# Patient Record
Sex: Female | Born: 1945
Health system: Southern US, Community
[De-identification: ages and names within clinical notes are randomized; demographics above are authoritative.]

## PROBLEM LIST (undated history)

## (undated) DIAGNOSIS — K649 Unspecified hemorrhoids: Secondary | ICD-10-CM

## (undated) DIAGNOSIS — Z9981 Dependence on supplemental oxygen: Secondary | ICD-10-CM

## (undated) DIAGNOSIS — E119 Type 2 diabetes mellitus without complications: Secondary | ICD-10-CM

## (undated) DIAGNOSIS — K5790 Diverticulosis of intestine, part unspecified, without perforation or abscess without bleeding: Secondary | ICD-10-CM

## (undated) DIAGNOSIS — C801 Malignant (primary) neoplasm, unspecified: Secondary | ICD-10-CM

## (undated) DIAGNOSIS — F0390 Unspecified dementia without behavioral disturbance: Secondary | ICD-10-CM

## (undated) DIAGNOSIS — I10 Essential (primary) hypertension: Secondary | ICD-10-CM

## (undated) DIAGNOSIS — I4892 Unspecified atrial flutter: Secondary | ICD-10-CM

## (undated) DIAGNOSIS — J449 Chronic obstructive pulmonary disease, unspecified: Secondary | ICD-10-CM

## (undated) DIAGNOSIS — Z853 Personal history of malignant neoplasm of breast: Secondary | ICD-10-CM

## (undated) HISTORY — DX: Essential (primary) hypertension: I10

## (undated) HISTORY — DX: Chronic obstructive pulmonary disease, unspecified: J44.9

## (undated) HISTORY — DX: Personal history of malignant neoplasm of breast: Z85.3

## (undated) HISTORY — PX: HERNIA REPAIR: SHX51

---

## 2004-05-31 ENCOUNTER — Ambulatory Visit: Payer: Self-pay | Admitting: Cardiology

## 2009-03-13 HISTORY — PX: MASTECTOMY: SHX3

## 2009-03-13 HISTORY — PX: BREAST SURGERY: SHX581

## 2009-08-27 ENCOUNTER — Encounter: Admission: RE | Admit: 2009-08-27 | Discharge: 2009-08-27 | Payer: Self-pay | Admitting: Unknown Physician Specialty

## 2009-09-02 ENCOUNTER — Encounter: Admission: RE | Admit: 2009-09-02 | Discharge: 2009-09-02 | Payer: Self-pay | Admitting: Unknown Physician Specialty

## 2009-09-14 ENCOUNTER — Ambulatory Visit (HOSPITAL_COMMUNITY): Admission: RE | Admit: 2009-09-14 | Discharge: 2009-09-14 | Payer: Self-pay | Admitting: General Surgery

## 2009-09-14 ENCOUNTER — Encounter: Admission: RE | Admit: 2009-09-14 | Discharge: 2009-09-14 | Payer: Self-pay | Admitting: General Surgery

## 2009-10-11 DIAGNOSIS — C50919 Malignant neoplasm of unspecified site of unspecified female breast: Secondary | ICD-10-CM | POA: Insufficient documentation

## 2009-10-13 ENCOUNTER — Ambulatory Visit: Payer: Self-pay | Admitting: Oncology

## 2009-11-02 ENCOUNTER — Ambulatory Visit (HOSPITAL_COMMUNITY): Admission: RE | Admit: 2009-11-02 | Discharge: 2009-11-03 | Payer: Self-pay | Admitting: General Surgery

## 2009-11-02 ENCOUNTER — Encounter (INDEPENDENT_AMBULATORY_CARE_PROVIDER_SITE_OTHER): Payer: Self-pay | Admitting: General Surgery

## 2010-05-27 LAB — CBC
HCT: 40 % (ref 36.0–46.0)
Hemoglobin: 13.5 g/dL (ref 12.0–15.0)
MCH: 29 pg (ref 26.0–34.0)
MCV: 85.9 fL (ref 78.0–100.0)
Platelets: 267 10*3/uL (ref 150–400)
RBC: 4.65 MIL/uL (ref 3.87–5.11)
RDW: 14.5 % (ref 11.5–15.5)

## 2010-05-27 LAB — BASIC METABOLIC PANEL
CO2: 31 mEq/L (ref 19–32)
Chloride: 101 mEq/L (ref 96–112)
Sodium: 139 mEq/L (ref 135–145)

## 2010-05-29 LAB — URINALYSIS, ROUTINE W REFLEX MICROSCOPIC
Bilirubin Urine: NEGATIVE
Hgb urine dipstick: NEGATIVE
Nitrite: NEGATIVE

## 2010-05-29 LAB — COMPREHENSIVE METABOLIC PANEL
ALT: 20 U/L (ref 0–35)
Creatinine, Ser: 0.67 mg/dL (ref 0.4–1.2)
Glucose, Bld: 112 mg/dL — ABNORMAL HIGH (ref 70–99)
Sodium: 141 mEq/L (ref 135–145)

## 2010-05-29 LAB — CBC
HCT: 43 % (ref 36.0–46.0)
MCH: 29.3 pg (ref 26.0–34.0)
MCHC: 33.2 g/dL (ref 30.0–36.0)
RBC: 4.86 MIL/uL (ref 3.87–5.11)
WBC: 8.5 10*3/uL (ref 4.0–10.5)

## 2010-05-29 LAB — SURGICAL PCR SCREEN: MRSA, PCR: NEGATIVE

## 2010-05-29 LAB — DIFFERENTIAL
Basophils Relative: 0 % (ref 0–1)
Eosinophils Absolute: 0.1 10*3/uL (ref 0.0–0.7)
Lymphocytes Relative: 24 % (ref 12–46)
Lymphs Abs: 2 10*3/uL (ref 0.7–4.0)
Neutro Abs: 5.8 10*3/uL (ref 1.7–7.7)

## 2010-08-09 ENCOUNTER — Encounter (INDEPENDENT_AMBULATORY_CARE_PROVIDER_SITE_OTHER): Payer: Self-pay | Admitting: General Surgery

## 2011-01-09 ENCOUNTER — Encounter (INDEPENDENT_AMBULATORY_CARE_PROVIDER_SITE_OTHER): Payer: Self-pay | Admitting: General Surgery

## 2011-03-10 ENCOUNTER — Ambulatory Visit (INDEPENDENT_AMBULATORY_CARE_PROVIDER_SITE_OTHER): Payer: Medicare Other | Admitting: General Surgery

## 2011-03-10 ENCOUNTER — Encounter (INDEPENDENT_AMBULATORY_CARE_PROVIDER_SITE_OTHER): Payer: Self-pay | Admitting: General Surgery

## 2011-03-10 VITALS — BP 156/98 | HR 112 | Temp 97.2°F | Resp 24 | Ht 62.0 in | Wt 188.6 lb

## 2011-03-10 DIAGNOSIS — D0590 Unspecified type of carcinoma in situ of unspecified breast: Secondary | ICD-10-CM

## 2011-03-10 DIAGNOSIS — D0510 Intraductal carcinoma in situ of unspecified breast: Secondary | ICD-10-CM | POA: Insufficient documentation

## 2011-03-10 NOTE — Progress Notes (Signed)
Patient returns for long-term followup status post right total mastectomy for high-grade ductal carcinoma in situ with a surgery date August of 2011. She is followed by Dr. Ubaldo Glassing in [place]. She reports she is doing well. She denies any arm swelling, breast masses or chest wall changes. She is tolerating her Arimadex with just hot flashes.  Imaging: She reports negative mammogram in [place] is here but I do not have access to that.  Exam: General: Moderately obese Afro-American female in no distress Skin: Warm and dry without rash or infection Lungs: Few scattered wheezes without increased work of breathing. Breath sounds equal. Lymph nodes: No cervical, supraclavicular, or axillary nodes palpable Breasts: Right mastectomy site well-healed with no skin changes or masses. Left breast without masses skin or nipple changes.  Assessment and plan: Doing well following right total mastectomy for DCIS with no evidence of new or recurrent breast cancer clinically. She will return in one year and this could likely be a final check.

## 2012-01-02 ENCOUNTER — Encounter: Payer: Self-pay | Admitting: Internal Medicine

## 2012-01-02 DIAGNOSIS — M810 Age-related osteoporosis without current pathological fracture: Secondary | ICD-10-CM

## 2012-01-02 DIAGNOSIS — D0590 Unspecified type of carcinoma in situ of unspecified breast: Secondary | ICD-10-CM

## 2012-01-18 DIAGNOSIS — C50919 Malignant neoplasm of unspecified site of unspecified female breast: Secondary | ICD-10-CM

## 2012-01-18 DIAGNOSIS — M810 Age-related osteoporosis without current pathological fracture: Secondary | ICD-10-CM

## 2012-06-26 ENCOUNTER — Encounter (INDEPENDENT_AMBULATORY_CARE_PROVIDER_SITE_OTHER): Payer: Self-pay | Admitting: Internal Medicine

## 2012-06-26 DIAGNOSIS — C50919 Malignant neoplasm of unspecified site of unspecified female breast: Secondary | ICD-10-CM

## 2012-06-26 DIAGNOSIS — J449 Chronic obstructive pulmonary disease, unspecified: Secondary | ICD-10-CM

## 2012-06-26 DIAGNOSIS — M810 Age-related osteoporosis without current pathological fracture: Secondary | ICD-10-CM

## 2012-07-08 DIAGNOSIS — M810 Age-related osteoporosis without current pathological fracture: Secondary | ICD-10-CM

## 2012-12-30 DIAGNOSIS — M810 Age-related osteoporosis without current pathological fracture: Secondary | ICD-10-CM

## 2012-12-30 DIAGNOSIS — C50919 Malignant neoplasm of unspecified site of unspecified female breast: Secondary | ICD-10-CM

## 2012-12-30 DIAGNOSIS — Z9010 Acquired absence of unspecified breast and nipple: Secondary | ICD-10-CM

## 2013-01-01 DIAGNOSIS — M810 Age-related osteoporosis without current pathological fracture: Secondary | ICD-10-CM

## 2013-08-27 ENCOUNTER — Other Ambulatory Visit: Payer: Self-pay | Admitting: *Deleted

## 2014-12-20 ENCOUNTER — Inpatient Hospital Stay (HOSPITAL_COMMUNITY)
Admission: EM | Admit: 2014-12-20 | Discharge: 2014-12-22 | DRG: 190 | Disposition: A | Discharge status: Home / Self Care | Payer: Medicare HMO | Attending: Internal Medicine | Admitting: Internal Medicine

## 2014-12-20 ENCOUNTER — Emergency Department (HOSPITAL_COMMUNITY): Payer: Medicare HMO

## 2014-12-20 ENCOUNTER — Encounter (HOSPITAL_COMMUNITY): Payer: Self-pay | Admitting: Emergency Medicine

## 2014-12-20 DIAGNOSIS — Z87891 Personal history of nicotine dependence: Secondary | ICD-10-CM

## 2014-12-20 DIAGNOSIS — J9621 Acute and chronic respiratory failure with hypoxia: Secondary | ICD-10-CM | POA: Diagnosis present

## 2014-12-20 DIAGNOSIS — I10 Essential (primary) hypertension: Secondary | ICD-10-CM | POA: Diagnosis present

## 2014-12-20 DIAGNOSIS — Z833 Family history of diabetes mellitus: Secondary | ICD-10-CM | POA: Diagnosis not present

## 2014-12-20 DIAGNOSIS — J441 Chronic obstructive pulmonary disease with (acute) exacerbation: Principal | ICD-10-CM | POA: Diagnosis present

## 2014-12-20 DIAGNOSIS — Z853 Personal history of malignant neoplasm of breast: Secondary | ICD-10-CM

## 2014-12-20 DIAGNOSIS — Z9981 Dependence on supplemental oxygen: Secondary | ICD-10-CM

## 2014-12-20 DIAGNOSIS — J45909 Unspecified asthma, uncomplicated: Secondary | ICD-10-CM | POA: Diagnosis present

## 2014-12-20 DIAGNOSIS — Z7952 Long term (current) use of systemic steroids: Secondary | ICD-10-CM

## 2014-12-20 DIAGNOSIS — J449 Chronic obstructive pulmonary disease, unspecified: Secondary | ICD-10-CM | POA: Diagnosis present

## 2014-12-20 DIAGNOSIS — D0510 Intraductal carcinoma in situ of unspecified breast: Secondary | ICD-10-CM | POA: Diagnosis present

## 2014-12-20 DIAGNOSIS — Z809 Family history of malignant neoplasm, unspecified: Secondary | ICD-10-CM | POA: Diagnosis not present

## 2014-12-20 DIAGNOSIS — J440 Chronic obstructive pulmonary disease with acute lower respiratory infection: Secondary | ICD-10-CM

## 2014-12-20 DIAGNOSIS — R0602 Shortness of breath: Secondary | ICD-10-CM | POA: Diagnosis present

## 2014-12-20 DIAGNOSIS — J209 Acute bronchitis, unspecified: Secondary | ICD-10-CM

## 2014-12-20 DIAGNOSIS — Z9010 Acquired absence of unspecified breast and nipple: Secondary | ICD-10-CM

## 2014-12-20 LAB — COMPREHENSIVE METABOLIC PANEL
ALBUMIN: 4.5 g/dL (ref 3.5–5.0)
ALT: 21 U/L (ref 14–54)
AST: 24 U/L (ref 15–41)
Alkaline Phosphatase: 66 U/L (ref 38–126)
Anion gap: 9 (ref 5–15)
BUN: 15 mg/dL (ref 6–20)
CHLORIDE: 98 mmol/L — AB (ref 101–111)
CO2: 33 mmol/L — AB (ref 22–32)
CREATININE: 0.63 mg/dL (ref 0.44–1.00)
Calcium: 10.9 mg/dL — ABNORMAL HIGH (ref 8.9–10.3)
GFR calc Af Amer: >60 mL/min (ref >60)
GLUCOSE: 111 mg/dL — AB (ref 65–99)
POTASSIUM: 4.2 mmol/L (ref 3.5–5.1)
SODIUM: 140 mmol/L (ref 135–145)
Total Bilirubin: 0.7 mg/dL (ref 0.3–1.2)
Total Protein: 7.7 g/dL (ref 6.5–8.1)

## 2014-12-20 LAB — CBC WITH DIFFERENTIAL/PLATELET
BASOS ABS: 0 10*3/uL (ref 0.0–0.1)
BASOS PCT: 0 %
EOS PCT: 0 %
Eosinophils Absolute: 0.1 10*3/uL (ref 0.0–0.7)
HCT: 45.6 % (ref 36.0–46.0)
Hemoglobin: 14.5 g/dL (ref 12.0–15.0)
LYMPHS PCT: 13 %
Lymphs Abs: 1.5 10*3/uL (ref 0.7–4.0)
MCH: 29.3 pg (ref 26.0–34.0)
MCHC: 31.8 g/dL (ref 30.0–36.0)
MCV: 92.1 fL (ref 78.0–100.0)
Monocytes Absolute: 0.4 10*3/uL (ref 0.1–1.0)
Monocytes Relative: 3 %
Neutro Abs: 9.7 10*3/uL — ABNORMAL HIGH (ref 1.7–7.7)
Neutrophils Relative %: 84 %
PLATELETS: 250 10*3/uL (ref 150–400)
RBC: 4.95 MIL/uL (ref 3.87–5.11)
RDW: 13.4 % (ref 11.5–15.5)
WBC: 11.6 10*3/uL — ABNORMAL HIGH (ref 4.0–10.5)

## 2014-12-20 LAB — I-STAT TROPONIN, ED: Troponin i, poc: 0 ng/mL (ref 0.00–0.08)

## 2014-12-20 MED ORDER — IBUPROFEN 400 MG PO TABS
400.0000 mg | ORAL_TABLET | Freq: Four times a day (QID) | ORAL | Status: DC | PRN
  Administered 2014-12-20: 400 mg via ORAL
  Filled 2014-12-20: qty 1

## 2014-12-20 MED ORDER — LEVOFLOXACIN IN D5W 500 MG/100ML IV SOLN
500.0000 mg | INTRAVENOUS | Status: DC
  Administered 2014-12-20 – 2014-12-21 (×2): 500 mg via INTRAVENOUS
  Filled 2014-12-20 (×2): qty 100

## 2014-12-20 MED ORDER — IPRATROPIUM-ALBUTEROL 0.5-2.5 (3) MG/3ML IN SOLN
3.0000 mL | Freq: Once | RESPIRATORY_TRACT | Status: AC
  Administered 2014-12-20: 3 mL via RESPIRATORY_TRACT
  Filled 2014-12-20: qty 3

## 2014-12-20 MED ORDER — ALBUTEROL SULFATE (2.5 MG/3ML) 0.083% IN NEBU
2.5000 mg | INHALATION_SOLUTION | Freq: Once | RESPIRATORY_TRACT | Status: AC
  Administered 2014-12-20: 2.5 mg via RESPIRATORY_TRACT
  Filled 2014-12-20: qty 3

## 2014-12-20 MED ORDER — DIPHENHYDRAMINE HCL 25 MG PO CAPS
25.0000 mg | ORAL_CAPSULE | Freq: Once | ORAL | Status: AC
  Administered 2014-12-20: 25 mg via ORAL
  Filled 2014-12-20: qty 1

## 2014-12-20 MED ORDER — ALBUTEROL SULFATE (2.5 MG/3ML) 0.083% IN NEBU: 2.5000 mg | INHALATION_SOLUTION | Freq: Four times a day (QID) | RESPIRATORY_TRACT | Status: DC | PRN

## 2014-12-20 MED ORDER — ALBUTEROL SULFATE HFA 108 (90 BASE) MCG/ACT IN AERS
2.0000 | INHALATION_SPRAY | Freq: Four times a day (QID) | RESPIRATORY_TRACT | Status: DC | PRN
  Filled 2014-12-20: qty 6.7

## 2014-12-20 MED ORDER — VITAMIN D (ERGOCALCIFEROL) 1.25 MG (50000 UNIT) PO CAPS
50000.0000 [IU] | ORAL_CAPSULE | ORAL | Status: DC
  Filled 2014-12-20: qty 1

## 2014-12-20 MED ORDER — MOMETASONE FURO-FORMOTEROL FUM 100-5 MCG/ACT IN AERO
2.0000 | INHALATION_SPRAY | Freq: Two times a day (BID) | RESPIRATORY_TRACT | Status: DC
  Administered 2014-12-20 – 2014-12-22 (×4): 2 via RESPIRATORY_TRACT
  Filled 2014-12-20: qty 8.8

## 2014-12-20 MED ORDER — INFLUENZA VAC SPLIT QUAD 0.5 ML IM SUSY
0.5000 mL | PREFILLED_SYRINGE | INTRAMUSCULAR | Status: AC
  Administered 2014-12-21: 0.5 mL via INTRAMUSCULAR
  Filled 2014-12-20: qty 0.5

## 2014-12-20 MED ORDER — MOMETASONE FURO-FORMOTEROL FUM 100-5 MCG/ACT IN AERO
INHALATION_SPRAY | RESPIRATORY_TRACT | Status: AC
  Filled 2014-12-20: qty 8.8

## 2014-12-20 MED ORDER — BUDESONIDE-FORMOTEROL FUMARATE 160-4.5 MCG/ACT IN AERO
2.0000 | INHALATION_SPRAY | Freq: Every day | RESPIRATORY_TRACT | Status: DC
  Filled 2014-12-20: qty 6

## 2014-12-20 MED ORDER — ONDANSETRON HCL 4 MG/2ML IJ SOLN: 4.0000 mg | Freq: Four times a day (QID) | INTRAMUSCULAR | Status: DC | PRN

## 2014-12-20 MED ORDER — TELMISARTAN-HCTZ 80-12.5 MG PO TABS
1.0000 | ORAL_TABLET | Freq: Every day | ORAL | Status: DC
  Filled 2014-12-20 (×2): qty 1

## 2014-12-20 MED ORDER — MECLIZINE HCL 12.5 MG PO TABS: 25.0000 mg | ORAL_TABLET | Freq: Three times a day (TID) | ORAL | Status: DC | PRN

## 2014-12-20 MED ORDER — HYDROCHLOROTHIAZIDE 12.5 MG PO CAPS
12.5000 mg | ORAL_CAPSULE | Freq: Every day | ORAL | Status: DC
  Administered 2014-12-20 – 2014-12-22 (×3): 12.5 mg via ORAL
  Filled 2014-12-20 (×3): qty 1

## 2014-12-20 MED ORDER — METHYLPREDNISOLONE SODIUM SUCC 125 MG IJ SOLR
125.0000 mg | Freq: Once | INTRAMUSCULAR | Status: AC
  Administered 2014-12-20: 125 mg via INTRAVENOUS
  Filled 2014-12-20: qty 2

## 2014-12-20 MED ORDER — BUDESONIDE-FORMOTEROL FUMARATE 160-4.5 MCG/ACT IN AERO
2.0000 | INHALATION_SPRAY | Freq: Two times a day (BID) | RESPIRATORY_TRACT | Status: DC
  Administered 2014-12-21 – 2014-12-22 (×3): 2 via RESPIRATORY_TRACT
  Filled 2014-12-20: qty 6

## 2014-12-20 MED ORDER — ENOXAPARIN SODIUM 40 MG/0.4ML ~~LOC~~ SOLN
40.0000 mg | SUBCUTANEOUS | Status: DC
  Administered 2014-12-20 – 2014-12-21 (×2): 40 mg via SUBCUTANEOUS
  Filled 2014-12-20 (×2): qty 0.4

## 2014-12-20 MED ORDER — TIOTROPIUM BROMIDE MONOHYDRATE 18 MCG IN CAPS
18.0000 ug | ORAL_CAPSULE | Freq: Every day | RESPIRATORY_TRACT | Status: DC
  Administered 2014-12-21 – 2014-12-22 (×2): 18 ug via RESPIRATORY_TRACT
  Filled 2014-12-20: qty 5

## 2014-12-20 MED ORDER — ONDANSETRON HCL 4 MG PO TABS: 4.0000 mg | ORAL_TABLET | Freq: Four times a day (QID) | ORAL | Status: DC | PRN

## 2014-12-20 MED ORDER — CETYLPYRIDINIUM CHLORIDE 0.05 % MT LIQD
7.0000 mL | Freq: Two times a day (BID) | OROMUCOSAL | Status: DC
  Administered 2014-12-21 – 2014-12-22 (×2): 7 mL via OROMUCOSAL

## 2014-12-20 MED ORDER — TIOTROPIUM BROMIDE MONOHYDRATE 18 MCG IN CAPS
18.0000 ug | ORAL_CAPSULE | Freq: Every day | RESPIRATORY_TRACT | Status: DC
  Filled 2014-12-20: qty 5

## 2014-12-20 MED ORDER — METHYLPREDNISOLONE SODIUM SUCC 125 MG IJ SOLR
80.0000 mg | Freq: Four times a day (QID) | INTRAMUSCULAR | Status: DC
  Administered 2014-12-20 – 2014-12-21 (×3): 80 mg via INTRAVENOUS
  Filled 2014-12-20 (×3): qty 2

## 2014-12-20 MED ORDER — VITAMIN D 1000 UNITS PO TABS
1000.0000 [IU] | ORAL_TABLET | Freq: Every day | ORAL | Status: DC
  Administered 2014-12-20 – 2014-12-22 (×3): 1000 [IU] via ORAL
  Filled 2014-12-20 (×5): qty 1

## 2014-12-20 MED ORDER — IRBESARTAN 300 MG PO TABS
300.0000 mg | ORAL_TABLET | Freq: Every day | ORAL | Status: DC
  Administered 2014-12-20 – 2014-12-22 (×3): 300 mg via ORAL
  Filled 2014-12-20 (×3): qty 1

## 2014-12-20 NOTE — ED Notes (Signed)
Patient complaining of worsening shortness of breath and cough. States productive cough with thick, clear mucus. Denies pain at this time. Wears oxygen via nasal canula continuously.

## 2014-12-20 NOTE — ED Notes (Signed)
Patient ambulated to restroom without oxygen. Patient's shortness of breath increased. Oxygen saturation on room air 79-82%. Patient placed back on oxygen via nasal canula and saturation back to 92%.

## 2014-12-20 NOTE — H&P (Signed)
[facility] History and Physical  Eileen A Heiny QVZ:[id number] DOB: 1946/01/20 DOA: 12/20/2014  Referring physician: ER PCP: Cleda Mccreedy, MD   Chief Complaint: Dyspnea  HPI: Eileen [name] is a 69 y.o. female  This is a 69 year old woman who is a 2 to three-week history of increasing dyspnea and wheezing associated with a productive cough of white/greenish sputum. She denies any fever. She is a ex-smoker and stopped smoking in 1996. She is oxygen-dependent and steroid-dependent every other day. Despite receiving bronchodilating treatment in the emergency room, she has not improved and in fact dropped her  oxygen saturations. She denied any chest pain, palpitations or limb weakness. She is now being admitted for further management.   Review of Systems:  Apart from symptoms above, all systems are negative.  Past Medical History  Diagnosis Date  . Hypertension   . Asthma   . History of breast cancer     right breast  . COPD (chronic obstructive pulmonary disease) ([place])    Past Surgical History  Procedure Laterality Date  . Mastectomy  2011    right breast  . Cesarean section    . Hernia repair      RIH   Social History:  reports that she quit smoking about 20 years ago. She does not have any smokeless tobacco history on file. She reports that she does not drink alcohol or use illicit drugs.  Allergies  Allergen Reactions  . Codeine Other (See Comments)    "jittery"    Family History  Problem Relation Age of Onset  . Cancer Mother     lung  . Diabetes Brother     Prior to Admission medications   Medication Sig Start Date End Date Taking? Authorizing Provider  albuterol (PROVENTIL) (2.5 MG/3ML) 0.083% nebulizer solution Take 2.5 mg by nebulization every 6 (six) hours as needed for wheezing or shortness of breath.   Yes Historical Provider, MD  Ascorbic Acid (VITAMIN C PO) Take 1 tablet by mouth daily.   Yes Historical Provider, MD  Cholecalciferol (VITAMIN  D PO) Take 1 tablet by mouth daily.    Yes Historical Provider, MD  Fluticasone-Salmeterol (ADVAIR) 250-50 MCG/DOSE AEPB Inhale 1 puff into the lungs 2 (two) times daily.   Yes Historical Provider, MD  ibuprofen (ADVIL,MOTRIN) 200 MG tablet Take 400 mg by mouth every 6 (six) hours as needed for moderate pain.   Yes Historical Provider, MD  meclizine (ANTIVERT) 25 MG tablet 25 mg 3 (three) times daily as needed.  03/06/11  Yes Historical Provider, MD  predniSONE (DELTASONE) 5 MG tablet Take 10 mg by mouth daily.  02/24/11  Yes Historical Provider, MD  PROAIR HFA 108 (90 BASE) MCG/ACT inhaler Inhale 2 puffs into the lungs every 6 (six) hours as needed for wheezing or shortness of breath.  03/06/11  Yes Historical Provider, MD  SPIRIVA HANDIHALER 18 MCG inhalation capsule Place 18 mcg into inhaler and inhale daily.  03/09/11  Yes Historical Provider, MD  SYMBICORT 160-4.5 MCG/ACT inhaler Inhale 2 puffs into the lungs daily.  12/14/10  Yes Historical Provider, MD  telmisartan-hydrochlorothiazide (MICARDIS HCT) 80-12.5 MG per tablet Take 1 tablet by mouth daily.     Yes Historical Provider, MD  Vitamin D, Ergocalciferol, (DRISDOL) 50000 UNITS CAPS capsule Take 50,000 Units by mouth every 30 (thirty) days.   Yes Historical Provider, MD  OXYGEN-HELIUM IN Inhale into the lungs. At night     Historical Provider, MD   Physical Exam: Filed Vitals:  12/20/14 1330 12/20/14 1345 12/20/14 1415 12/20/14 1530  BP: 125/77  122/75 126/76  Pulse: 84 94 77 89  Temp:      TempSrc:      Resp: 13 24 20 16   SpO2: 94% 93% 95% 92%    Wt Readings from Last 3 Encounters:  03/10/11 85.548 kg (188 lb 9.6 oz)    General:  Appears calm and comfortable. There is no increased respiratory distress at rest at the present time. There is no peripheral or central cyanosis. Eyes: PERRL, normal lids, irises & conjunctiva ENT: grossly normal hearing, lips & tongue Neck: no LAD, masses or thyromegaly Cardiovascular: RRR, no  m/r/g. No LE edema. Telemetry: SR, no arrhythmias  Respiratory: Bilateral wheezing, tight with poor air entry. Abdomen: soft, ntnd Skin: no rash or induration seen on limited exam Musculoskeletal: grossly normal tone BUE/BLE Psychiatric: grossly normal mood and affect, speech fluent and appropriate Neurologic: grossly non-focal.          Labs on Admission:  Basic Metabolic Panel:  Recent Labs Lab 12/20/14 1321  NA 140  K 4.2  CL 98*  CO2 33*  GLUCOSE 111*  BUN 15  CREATININE 0.63  CALCIUM 10.9*   Liver Function Tests:  Recent Labs Lab 12/20/14 1321  AST 24  ALT 21  ALKPHOS 66  BILITOT 0.7  PROT 7.7  ALBUMIN 4.5   No results for input(s): LIPASE, AMYLASE in the last 168 hours. No results for input(s): AMMONIA in the last 168 hours. CBC:  Recent Labs Lab 12/20/14 1321  WBC 11.6*  NEUTROABS 9.7*  HGB 14.5  HCT 45.6  MCV 92.1  PLT 250   Cardiac Enzymes: No results for input(s): CKTOTAL, CKMB, CKMBINDEX, TROPONINI in the last 168 hours.  BNP (last 3 results) No results for input(s): BNP in the last 8760 hours.  ProBNP (last 3 results) No results for input(s): PROBNP in the last 8760 hours.  CBG: No results for input(s): GLUCAP in the last 168 hours.  Radiological Exams on Admission: Dg Chest 2 View  12/20/2014   CLINICAL DATA:  Shortness of breath.  EXAM: CHEST  2 VIEW  COMPARISON:  November 21, 2011.  FINDINGS: The heart size and mediastinal contours are within normal limits. Both lungs are clear. No pneumothorax or pleural effusion is noted. The visualized skeletal structures are unremarkable.  IMPRESSION: No active cardiopulmonary disease.   Electronically Signed   By: Marijo Conception, M.D.   On: 12/20/2014 14:13      Assessment/Plan   1. COPD exacerbation. She'll be treated with intravenous steroids, bronchodilators and antibiotics.  She'll be admitted to the medical floor and further recommendations will depend on patient's hospital  progress. if consultant consulted, please document name and whether formally or informally consulted  Code Status: Full code.   DVT Prophylaxis: Lovenox.  Family Communication: I discussed the plan with the patient at the bedside.  Disposition Plan: Home when medically stable.   Time spent: 45 minutes.  Doree Albee [facility] Pager [phone].

## 2014-12-20 NOTE — ED Provider Notes (Signed)
CSN: [id number]     Arrival date & time 12/20/14  1236 History   First MD Initiated Contact with Patient 12/20/14 1301     Chief Complaint  Patient presents with  . Shortness of Breath  . Cough     (Consider location/radiation/quality/duration/timing/severity/associated sxs/prior Treatment) Patient is a 69 y.o. female presenting with shortness of breath and cough. The history is provided by the patient (Patient complains of shortness breath and cough for a few days).  Shortness of Breath Severity:  Moderate Onset quality:  Sudden Timing:  Constant Progression:  Waxing and waning Chronicity:  New Context: not activity   Associated symptoms: cough   Associated symptoms: no abdominal pain, no chest pain, no headaches and no rash   Cough Associated symptoms: shortness of breath   Associated symptoms: no chest pain, no eye discharge, no headaches and no rash     Past Medical History  Diagnosis Date  . Hypertension   . Asthma   . History of breast cancer     right breast  . COPD (chronic obstructive pulmonary disease) ([place])    Past Surgical History  Procedure Laterality Date  . Mastectomy  2011    right breast  . Cesarean section    . Hernia repair      RIH   Family History  Problem Relation Age of Onset  . Cancer Mother     lung  . Diabetes Brother    Social History  Substance Use Topics  . Smoking status: Former Smoker    Quit date: 03/13/1994  . Smokeless tobacco: None  . Alcohol Use: No   OB History    No data available     Review of Systems  Constitutional: Negative for appetite change and fatigue.  HENT: Negative for congestion, ear discharge and sinus pressure.   Eyes: Negative for discharge.  Respiratory: Positive for cough and shortness of breath.   Cardiovascular: Negative for chest pain.  Gastrointestinal: Negative for abdominal pain and diarrhea.  Genitourinary: Negative for frequency and hematuria.  Musculoskeletal: Negative for back pain.   Skin: Negative for rash.  Neurological: Negative for seizures and headaches.  Psychiatric/Behavioral: Negative for hallucinations.      Allergies  Codeine  Home Medications   Prior to Admission medications   Medication Sig Start Date End Date Taking? Authorizing Provider  albuterol (PROVENTIL) (2.5 MG/3ML) 0.083% nebulizer solution Take 2.5 mg by nebulization every 6 (six) hours as needed for wheezing or shortness of breath.   Yes Historical Provider, MD  Ascorbic Acid (VITAMIN C PO) Take 1 tablet by mouth daily.   Yes Historical Provider, MD  Cholecalciferol (VITAMIN D PO) Take 1 tablet by mouth daily.    Yes Historical Provider, MD  Fluticasone-Salmeterol (ADVAIR) 250-50 MCG/DOSE AEPB Inhale 1 puff into the lungs 2 (two) times daily.   Yes Historical Provider, MD  ibuprofen (ADVIL,MOTRIN) 200 MG tablet Take 400 mg by mouth every 6 (six) hours as needed for moderate pain.   Yes Historical Provider, MD  meclizine (ANTIVERT) 25 MG tablet 25 mg 3 (three) times daily as needed.  03/06/11  Yes Historical Provider, MD  predniSONE (DELTASONE) 5 MG tablet Take 10 mg by mouth daily.  02/24/11  Yes Historical Provider, MD  PROAIR HFA 108 (90 BASE) MCG/ACT inhaler Inhale 2 puffs into the lungs every 6 (six) hours as needed for wheezing or shortness of breath.  03/06/11  Yes Historical Provider, MD  SPIRIVA HANDIHALER 18 MCG inhalation capsule Place 18 mcg into  inhaler and inhale daily.  03/09/11  Yes Historical Provider, MD  SYMBICORT 160-4.5 MCG/ACT inhaler Inhale 2 puffs into the lungs daily.  12/14/10  Yes Historical Provider, MD  telmisartan-hydrochlorothiazide (MICARDIS HCT) 80-12.5 MG per tablet Take 1 tablet by mouth daily.     Yes Historical Provider, MD  Vitamin D, Ergocalciferol, (DRISDOL) 50000 UNITS CAPS capsule Take 50,000 Units by mouth every 30 (thirty) days.   Yes Historical Provider, MD  OXYGEN-HELIUM IN Inhale into the lungs. At night     Historical Provider, MD   BP 122/75 mmHg   Pulse 77  Temp(Src) 98.7 F (37.1 C) (Oral)  Resp 20  SpO2 95% Physical Exam  Constitutional: She is oriented to person, place, and time. She appears well-developed.  HENT:  Head: Normocephalic.  Eyes: Conjunctivae and EOM are normal. No scleral icterus.  Neck: Neck supple. No thyromegaly present.  Cardiovascular: Normal rate and regular rhythm.  Exam reveals no gallop and no friction rub.   No murmur heard. Pulmonary/Chest: No stridor. She has wheezes. She has no rales. She exhibits no tenderness.  Abdominal: She exhibits no distension. There is no tenderness. There is no rebound.  Musculoskeletal: Normal range of motion. She exhibits no edema.  Lymphadenopathy:    She has no cervical adenopathy.  Neurological: She is oriented to person, place, and time. She exhibits normal muscle tone. Coordination normal.  Skin: No rash noted. No erythema.  Psychiatric: She has a normal mood and affect. Her behavior is normal.    ED Course  Procedures (including critical care time) Labs Review Labs Reviewed  CBC WITH DIFFERENTIAL/PLATELET - Abnormal; Notable for the following:    WBC 11.6 (*)    Neutro Abs 9.7 (*)    All other components within normal limits  COMPREHENSIVE METABOLIC PANEL - Abnormal; Notable for the following:    Chloride 98 (*)    CO2 33 (*)    Glucose, Bld 111 (*)    Calcium 10.9 (*)    All other components within normal limits  I-STAT TROPOININ, ED    Imaging Review Dg Chest 2 View  12/20/2014   CLINICAL DATA:  Shortness of breath.  EXAM: CHEST  2 VIEW  COMPARISON:  November 21, 2011.  FINDINGS: The heart size and mediastinal contours are within normal limits. Both lungs are clear. No pneumothorax or pleural effusion is noted. The visualized skeletal structures are unremarkable.  IMPRESSION: No active cardiopulmonary disease.   Electronically Signed   By: Marijo Conception, M.D.   On: 12/20/2014 14:13   I have personally reviewed and evaluated these images and lab  results as part of my medical decision-making.   EKG Interpretation   Date/Time:  Sunday December 20 2014 13:00:05 EDT Ventricular Rate:  98 PR Interval:  116 QRS Duration: 82 QT Interval:  335 QTC Calculation: 428 R Axis:   2 Text Interpretation:  Sinus tachycardia Multiform ventricular premature  complexes Borderline short PR interval Confirmed by Rohil Lesch  MD, Mattias Walmsley  [phone]) on 12/20/2014 3:44:38 PM      MDM   Final diagnoses:  COPD with acute bronchitis ([place])    COPD exacerbation we'll admit to medicine   Milton Ferguson, MD 12/20/14 1545

## 2014-12-21 DIAGNOSIS — J441 Chronic obstructive pulmonary disease with (acute) exacerbation: Secondary | ICD-10-CM | POA: Diagnosis not present

## 2014-12-21 LAB — CBC
HCT: 45.5 % (ref 36.0–46.0)
HEMOGLOBIN: 14.8 g/dL (ref 12.0–15.0)
MCH: 30.1 pg (ref 26.0–34.0)
MCHC: 32.5 g/dL (ref 30.0–36.0)
MCV: 92.5 fL (ref 78.0–100.0)
PLATELETS: 263 10*3/uL (ref 150–400)
RBC: 4.92 MIL/uL (ref 3.87–5.11)
RDW: 13.3 % (ref 11.5–15.5)
WBC: 9.5 10*3/uL (ref 4.0–10.5)

## 2014-12-21 LAB — COMPREHENSIVE METABOLIC PANEL
ALBUMIN: 3.9 g/dL (ref 3.5–5.0)
ALK PHOS: 63 U/L (ref 38–126)
ALT: 20 U/L (ref 14–54)
ANION GAP: 7 (ref 5–15)
AST: 22 U/L (ref 15–41)
BILIRUBIN TOTAL: 0.5 mg/dL (ref 0.3–1.2)
BUN: 16 mg/dL (ref 6–20)
CALCIUM: 9.2 mg/dL (ref 8.9–10.3)
CO2: 32 mmol/L (ref 22–32)
Chloride: 98 mmol/L — ABNORMAL LOW (ref 101–111)
Creatinine, Ser: 0.68 mg/dL (ref 0.44–1.00)
GFR calc non Af Amer: >60 mL/min (ref >60)
GLUCOSE: 158 mg/dL — AB (ref 65–99)
Potassium: 4.3 mmol/L (ref 3.5–5.1)
Sodium: 137 mmol/L (ref 135–145)
Total Protein: 7.4 g/dL (ref 6.5–8.1)

## 2014-12-21 MED ORDER — METHYLPREDNISOLONE SODIUM SUCC 125 MG IJ SOLR
80.0000 mg | Freq: Two times a day (BID) | INTRAMUSCULAR | Status: DC
  Administered 2014-12-21 – 2014-12-22 (×2): 80 mg via INTRAVENOUS
  Filled 2014-12-21 (×2): qty 2

## 2014-12-21 NOTE — Progress Notes (Signed)
[facility] PROGRESS NOTE  Eileen [name] [id number] DOB: Oct 19, 1945 DOA: 12/20/2014 PCP: Cleda Mccreedy, MD  Assessment/Plan: Acute on Chronic Hypoxemic Respiratory Failure -2/2 COPD with acute exacerbation. -Continue oxygen supplementation as needed. -Please see below for details.  COPD with Acute Exacerbation -Appears improved. -Start titrating steroids. -Continue nebs and levaquin.  Code Status: Full Code Family Communication: Patient only  Disposition Plan: Home when ready; anticipate 24 hours.   Consultants:  None   Antibiotics:  Levaquin   Subjective: Feels much better, less SOB, no CP.  Objective: Filed Vitals:   12/21/14 0549 12/21/14 1032 12/21/14 1033 12/21/14 1042  BP: 113/69   116/72  Pulse: 81  103 107  Temp: 97.9 F (36.6 C)   97.8 F (36.6 C)  TempSrc: Oral   Oral  Resp: 20  20 20   Height:      Weight:      SpO2: 94% 93% 93% 95%    Intake/Output Summary (Last 24 hours) at 12/21/14 1427 Last data filed at 12/20/14 1845  Gross per 24 hour  Intake    100 ml  Output      0 ml  Net    100 ml   Filed Weights   12/20/14 1705  Weight: 79.017 kg (174 lb 3.2 oz)    Exam:   General:  AA Ox3  Cardiovascular: RRR  Respiratory: mild expiratory wheezes, fair air movement  Abdomen: S/NT/ND/+BS  Extremities: no C/C/E   Neurologic:  Non-focal, intact  Data Reviewed: Basic Metabolic Panel:  Recent Labs Lab 12/20/14 1321 12/21/14 0504  NA 140 137  K 4.2 4.3  CL 98* 98*  CO2 33* 32  GLUCOSE 111* 158*  BUN 15 16  CREATININE 0.63 0.68  CALCIUM 10.9* 9.2   Liver Function Tests:  Recent Labs Lab 12/20/14 1321 12/21/14 0504  AST 24 22  ALT 21 20  ALKPHOS 66 63  BILITOT 0.7 0.5  PROT 7.7 7.4  ALBUMIN 4.5 3.9   No results for input(s): LIPASE, AMYLASE in the last 168 hours. No results for input(s): AMMONIA in the last 168 hours. CBC:  Recent Labs Lab 12/20/14 1321 12/21/14 0504  WBC 11.6* 9.5    NEUTROABS 9.7*  --   HGB 14.5 14.8  HCT 45.6 45.5  MCV 92.1 92.5  PLT 250 263   Cardiac Enzymes: No results for input(s): CKTOTAL, CKMB, CKMBINDEX, TROPONINI in the last 168 hours. BNP (last 3 results) No results for input(s): BNP in the last 8760 hours.  ProBNP (last 3 results) No results for input(s): PROBNP in the last 8760 hours.  CBG: No results for input(s): GLUCAP in the last 168 hours.  No results found for this or any previous visit (from the past 240 hour(s)).   Studies: Dg Chest 2 View  12/20/2014   CLINICAL DATA:  Shortness of breath.  EXAM: CHEST  2 VIEW  COMPARISON:  November 21, 2011.  FINDINGS: The heart size and mediastinal contours are within normal limits. Both lungs are clear. No pneumothorax or pleural effusion is noted. The visualized skeletal structures are unremarkable.  IMPRESSION: No active cardiopulmonary disease.   Electronically Signed   By: Marijo Conception, M.D.   On: 12/20/2014 14:13    Scheduled Meds: . antiseptic oral rinse  7 mL Mouth Rinse BID  . budesonide-formoterol  2 puff Inhalation BID  . cholecalciferol  1,000 Units Oral Daily  . enoxaparin (LOVENOX) injection  40 mg Subcutaneous Q24H  . irbesartan  300  mg Oral Daily   And  . hydrochlorothiazide  12.5 mg Oral Daily  . levofloxacin (LEVAQUIN) IV  500 mg Intravenous Q24H  . methylPREDNISolone (SOLU-MEDROL) injection  80 mg Intravenous Q6H  . mometasone-formoterol  2 puff Inhalation BID  . tiotropium  18 mcg Inhalation Daily  . Vitamin D (Ergocalciferol)  50,000 Units Oral Q30 days   Continuous Infusions:   Active Problems:   DCIS (ductal carcinoma in situ) of breast, right, 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, Arimadex   COPD (chronic obstructive pulmonary disease) ([facility])   COPD exacerbation ([facility])    Time spent: 25 minutes. Greater than 50% of this time was spent in direct contact with the patient coordinating care.    Lelon Frohlich  [facility] Pager [phone]  If  7PM-7AM, please contact night-coverage at [email or web address], password [facility] 12/21/2014, 2:27 PM  LOS: 1 day

## 2014-12-21 NOTE — Care Management Note (Signed)
Case Management Note  Patient Details  Name: Eileen [name] MRN: [id number] Date of Birth: [birth year]  Subjective/Objective:                  Pt admitted from home with COPD exacerbation. Pt lives with her daughter and will return home at discharge. Pt is independent with ADL's. Pt has home O2 in place from [facility], learning disability) and a neb machine.   Action/Plan: No CM needs anticipated.  Expected Discharge Date:                  Expected Discharge Plan:  Home/Self Care  In-House Referral:  NA  Discharge planning Services  CM Consult  Post Acute Care Choice:  NA Choice offered to:  NA  DME Arranged:    DME Agency:     HH Arranged:    HH Agency:     Status of Service:  Completed, signed off  Medicare Important Message Given:    Date Medicare IM Given:    Medicare IM give by:    Date Additional Medicare IM Given:    Additional Medicare Important Message give by:     If discussed at [place] of Stay Meetings, dates discussed:    Additional Comments:  Joylene Draft, RN 12/21/2014, 1:18 PM

## 2014-12-22 DIAGNOSIS — D0510 Intraductal carcinoma in situ of unspecified breast: Secondary | ICD-10-CM

## 2014-12-22 LAB — BASIC METABOLIC PANEL
Anion gap: 6 (ref 5–15)
BUN: 25 mg/dL — ABNORMAL HIGH (ref 6–20)
CHLORIDE: 97 mmol/L — AB (ref 101–111)
CO2: 34 mmol/L — AB (ref 22–32)
CREATININE: 0.81 mg/dL (ref 0.44–1.00)
Calcium: 8.8 mg/dL — ABNORMAL LOW (ref 8.9–10.3)
GFR calc non Af Amer: >60 mL/min (ref >60)
GLUCOSE: 176 mg/dL — AB (ref 65–99)
Potassium: 4.2 mmol/L (ref 3.5–5.1)
Sodium: 137 mmol/L (ref 135–145)

## 2014-12-22 LAB — CBC
HCT: 44.2 % (ref 36.0–46.0)
HEMOGLOBIN: 14.2 g/dL (ref 12.0–15.0)
MCH: 29.8 pg (ref 26.0–34.0)
MCHC: 32.1 g/dL (ref 30.0–36.0)
MCV: 92.7 fL (ref 78.0–100.0)
PLATELETS: 282 10*3/uL (ref 150–400)
RBC: 4.77 MIL/uL (ref 3.87–5.11)
RDW: 13.5 % (ref 11.5–15.5)
WBC: 18.2 10*3/uL — ABNORMAL HIGH (ref 4.0–10.5)

## 2014-12-22 MED ORDER — LEVOFLOXACIN 750 MG PO TABS: 750.0000 mg | ORAL_TABLET | Freq: Every day | ORAL | Status: DC

## 2014-12-22 MED ORDER — PREDNISONE 10 MG PO TABS: 10.0000 mg | ORAL_TABLET | Freq: Every day | ORAL | Status: DC

## 2014-12-22 NOTE — Care Management Note (Signed)
Case Management Note  Patient Details  Name: Eileen [name] MRN: [id number] Date of Birth: 1945/09/29  Expected Discharge Date:                  Expected Discharge Plan:  Home/Self Care  In-House Referral:  NA  Discharge planning Services  CM Consult  Post Acute Care Choice:  NA Choice offered to:  NA  DME Arranged:    DME Agency:     HH Arranged:    [place] Agency:     Status of Service:  Completed, signed off  Medicare Important Message Given:  N/A - LOS <3 / Initial given by admissions Date Medicare IM Given:    Medicare IM give by:    Date Additional Medicare IM Given:    Additional Medicare Important Message give by:     If discussed at [place] of Stay Meetings, dates discussed:    Additional Comments: Pt discharged home today with self care. No CM needs noted.  Sherald Barge, RN 12/22/2014, 2:37 PM

## 2014-12-22 NOTE — Discharge Summary (Signed)
Physician Discharge Summary  Eileen [name] STM:[id number] DOB: Oct 25, 1945 DOA: 12/20/2014  PCP: Cleda Mccreedy, MD  Admit date: 12/20/2014 Discharge date: 12/22/2014  Time spent: 45 minutes  Recommendations for Outpatient Follow-up:  -Will be discharged home today. -Advised to follow up with PCP in 2 weeks.   Discharge Diagnoses:  Active Problems:   DCIS (ductal carcinoma in situ) of breast, right, 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, Arimadex   COPD (chronic obstructive pulmonary disease) ([place])   COPD exacerbation ([place])   Discharge Condition: Stable and improved  Filed Weights   12/20/14 1705  Weight: 79.017 kg (174 lb 3.2 oz)    History of present illness:  As per Dr. Anastasio Champion 10/9: Eileen [name] is a 69 y.o. female  This is a 69 year old woman who is a 2 to three-week history of increasing dyspnea and wheezing associated with a productive cough of white/greenish sputum. She denies any fever. She is a ex-smoker and stopped smoking in 1996. She is oxygen-dependent and steroid-dependent every other day. Despite receiving bronchodilating treatment in the emergency room, she has not improved and in fact dropped her oxygen saturations. She denied any chest pain, palpitations or limb weakness. She is now being admitted for further management.  Hospital Course:   Acute on Chronic Hypoxemic Respiratory Failure -2/2 COPD with acute exacerbation. -Continue oxygen supplementation at home.  COPD with Acute Exacerbation -Clinically improved. -Will Dc on a course of levaquin, prednisone taper. Will continue symbicort, spiriva and as needed albuterol.   Procedures:  None   Consultations:  None  Discharge Instructions  Discharge Instructions    Diet - low sodium heart healthy    Complete by:  As directed      Increase activity slowly    Complete by:  As directed             Medication List    STOP taking these medications        Fluticasone-Salmeterol 250-50  MCG/DOSE Aepb  Commonly known as:  ADVAIR      TAKE these medications        ibuprofen 200 MG tablet  Commonly known as:  ADVIL,MOTRIN  Take 400 mg by mouth every 6 (six) hours as needed for moderate pain.     levofloxacin 750 MG tablet  Commonly known as:  LEVAQUIN  Take 1 tablet (750 mg total) by mouth daily.     meclizine 25 MG tablet  Commonly known as:  ANTIVERT  25 mg 3 (three) times daily as needed.     OXYGEN  Inhale into the lungs. At night     predniSONE 5 MG tablet  Commonly known as:  DELTASONE  Take 10 mg by mouth daily.     predniSONE 10 MG tablet  Commonly known as:  DELTASONE  Take 1 tablet (10 mg total) by mouth daily with breakfast. Take 6 tablets today and then decrease by 1 tablet daily until none are left.     albuterol (2.5 MG/3ML) 0.083% nebulizer solution  Commonly known as:  PROVENTIL  Take 2.5 mg by nebulization every 6 (six) hours as needed for wheezing or shortness of breath.     PROAIR HFA 108 (90 BASE) MCG/ACT inhaler  Generic drug:  albuterol  Inhale 2 puffs into the lungs every 6 (six) hours as needed for wheezing or shortness of breath.     SPIRIVA HANDIHALER 18 MCG inhalation capsule  Generic drug:  tiotropium  Place 18 mcg into inhaler and inhale daily.  SYMBICORT 160-4.5 MCG/ACT inhaler  Generic drug:  budesonide-formoterol  Inhale 2 puffs into the lungs daily.     telmisartan-hydrochlorothiazide 80-12.5 MG tablet  Commonly known as:  MICARDIS HCT  Take 1 tablet by mouth daily.     VITAMIN C PO  Take 1 tablet by mouth daily.     Vitamin D (Ergocalciferol) 50000 UNITS Caps capsule  Commonly known as:  DRISDOL  Take 50,000 Units by mouth every 30 (thirty) days.     VITAMIN D PO  Take 1 tablet by mouth daily.       Allergies  Allergen Reactions  . Codeine Other (See Comments)    "jittery"       Follow-up Information    Follow up with Cleda Mccreedy, MD On 01/04/2015.   Specialty:  Internal Medicine   Why:  at  11:00 am   Contact information:   [place] [address] [phone]        The results of significant diagnostics from this hospitalization (including imaging, microbiology, ancillary and laboratory) are listed below for reference.    Significant Diagnostic Studies: Dg Chest 2 View  12/20/2014   CLINICAL DATA:  Shortness of breath.  EXAM: CHEST  2 VIEW  COMPARISON:  November 21, 2011.  FINDINGS: The heart size and mediastinal contours are within normal limits. Both lungs are clear. No pneumothorax or pleural effusion is noted. The visualized skeletal structures are unremarkable.  IMPRESSION: No active cardiopulmonary disease.   Electronically Signed   By: Marijo Conception, M.D.   On: 12/20/2014 14:13    Microbiology: No results found for this or any previous visit (from the past 240 hour(s)).   Labs: Basic Metabolic Panel:  Recent Labs Lab 12/20/14 1321 12/21/14 0504 12/22/14 0644  NA 140 137 137  K 4.2 4.3 4.2  CL 98* 98* 97*  CO2 33* 32 34*  GLUCOSE 111* 158* 176*  BUN 15 16 25*  CREATININE 0.63 0.68 0.81  CALCIUM 10.9* 9.2 8.8*   Liver Function Tests:  Recent Labs Lab 12/20/14 1321 12/21/14 0504  AST 24 22  ALT 21 20  ALKPHOS 66 63  BILITOT 0.7 0.5  PROT 7.7 7.4  ALBUMIN 4.5 3.9   No results for input(s): LIPASE, AMYLASE in the last 168 hours. No results for input(s): AMMONIA in the last 168 hours. CBC:  Recent Labs Lab 12/20/14 1321 12/21/14 0504 12/22/14 0644  WBC 11.6* 9.5 18.2*  NEUTROABS 9.7*  --   --   HGB 14.5 14.8 14.2  HCT 45.6 45.5 44.2  MCV 92.1 92.5 92.7  PLT 250 263 282   Cardiac Enzymes: No results for input(s): CKTOTAL, CKMB, CKMBINDEX, TROPONINI in the last 168 hours. BNP: BNP (last 3 results) No results for input(s): BNP in the last 8760 hours.  ProBNP (last 3 results) No results for input(s): PROBNP in the last 8760 hours.  CBG: No results for input(s): GLUCAP in the last 168  hours.     SignedLelon Frohlich  [facility] Pager: [phone] 12/22/2014, 1:56 PM

## 2014-12-22 NOTE — Care Management Important Message (Signed)
Important Message  Patient Details  Name: [name] Hornig MRN: [id number] Date of Birth: 01-11-1946   Medicare Important Message Given:  N/A - LOS <3 / Initial given by admissions    Sherald Barge, RN 12/22/2014, 2:37 PM

## 2014-12-22 NOTE — Progress Notes (Signed)
Discharge instructions reviewed with patient and her daughter, understanding verbalized.  Left via wheelchair with home oxygen at 2L to be discharged home with daughter.  Stable at discharge.

## 2015-03-21 ENCOUNTER — Inpatient Hospital Stay (HOSPITAL_COMMUNITY): Payer: Medicare Other

## 2015-03-21 ENCOUNTER — Inpatient Hospital Stay (HOSPITAL_COMMUNITY)
Admission: EM | Admit: 2015-03-21 | Discharge: 2015-03-30 | DRG: 308 | Disposition: A | Discharge status: Home Health | Payer: Medicare Other | Attending: Internal Medicine | Admitting: Internal Medicine

## 2015-03-21 ENCOUNTER — Encounter (HOSPITAL_COMMUNITY): Payer: Self-pay | Admitting: Emergency Medicine

## 2015-03-21 ENCOUNTER — Emergency Department (HOSPITAL_COMMUNITY): Payer: Medicare Other

## 2015-03-21 DIAGNOSIS — Z87891 Personal history of nicotine dependence: Secondary | ICD-10-CM

## 2015-03-21 DIAGNOSIS — E669 Obesity, unspecified: Secondary | ICD-10-CM | POA: Diagnosis not present

## 2015-03-21 DIAGNOSIS — I10 Essential (primary) hypertension: Secondary | ICD-10-CM | POA: Diagnosis present

## 2015-03-21 DIAGNOSIS — E872 Acidosis: Secondary | ICD-10-CM | POA: Diagnosis present

## 2015-03-21 DIAGNOSIS — Z809 Family history of malignant neoplasm, unspecified: Secondary | ICD-10-CM

## 2015-03-21 DIAGNOSIS — Z5181 Encounter for therapeutic drug level monitoring: Secondary | ICD-10-CM | POA: Diagnosis not present

## 2015-03-21 DIAGNOSIS — J9621 Acute and chronic respiratory failure with hypoxia: Secondary | ICD-10-CM | POA: Diagnosis present

## 2015-03-21 DIAGNOSIS — J9690 Respiratory failure, unspecified, unspecified whether with hypoxia or hypercapnia: Secondary | ICD-10-CM

## 2015-03-21 DIAGNOSIS — I484 Atypical atrial flutter: Secondary | ICD-10-CM

## 2015-03-21 DIAGNOSIS — Z9981 Dependence on supplemental oxygen: Secondary | ICD-10-CM | POA: Diagnosis not present

## 2015-03-21 DIAGNOSIS — J9622 Acute and chronic respiratory failure with hypercapnia: Secondary | ICD-10-CM | POA: Diagnosis present

## 2015-03-21 DIAGNOSIS — J440 Chronic obstructive pulmonary disease with acute lower respiratory infection: Secondary | ICD-10-CM | POA: Diagnosis present

## 2015-03-21 DIAGNOSIS — G9341 Metabolic encephalopathy: Secondary | ICD-10-CM | POA: Diagnosis present

## 2015-03-21 DIAGNOSIS — J189 Pneumonia, unspecified organism: Secondary | ICD-10-CM | POA: Diagnosis present

## 2015-03-21 DIAGNOSIS — I4892 Unspecified atrial flutter: Secondary | ICD-10-CM | POA: Diagnosis present

## 2015-03-21 DIAGNOSIS — I471 Supraventricular tachycardia: Secondary | ICD-10-CM | POA: Diagnosis not present

## 2015-03-21 DIAGNOSIS — I4891 Unspecified atrial fibrillation: Secondary | ICD-10-CM | POA: Diagnosis present

## 2015-03-21 DIAGNOSIS — Z7901 Long term (current) use of anticoagulants: Secondary | ICD-10-CM

## 2015-03-21 DIAGNOSIS — Z833 Family history of diabetes mellitus: Secondary | ICD-10-CM | POA: Diagnosis not present

## 2015-03-21 DIAGNOSIS — D0510 Intraductal carcinoma in situ of unspecified breast: Secondary | ICD-10-CM | POA: Diagnosis present

## 2015-03-21 DIAGNOSIS — Z6833 Body mass index (BMI) 33.0-33.9, adult: Secondary | ICD-10-CM | POA: Diagnosis not present

## 2015-03-21 DIAGNOSIS — Z9011 Acquired absence of right breast and nipple: Secondary | ICD-10-CM

## 2015-03-21 DIAGNOSIS — Z7189 Other specified counseling: Secondary | ICD-10-CM | POA: Diagnosis not present

## 2015-03-21 DIAGNOSIS — J441 Chronic obstructive pulmonary disease with (acute) exacerbation: Secondary | ICD-10-CM | POA: Diagnosis not present

## 2015-03-21 DIAGNOSIS — Z7952 Long term (current) use of systemic steroids: Secondary | ICD-10-CM | POA: Diagnosis not present

## 2015-03-21 DIAGNOSIS — I272 Other secondary pulmonary hypertension: Secondary | ICD-10-CM | POA: Diagnosis present

## 2015-03-21 DIAGNOSIS — Z853 Personal history of malignant neoplasm of breast: Secondary | ICD-10-CM | POA: Diagnosis not present

## 2015-03-21 DIAGNOSIS — R0602 Shortness of breath: Secondary | ICD-10-CM

## 2015-03-21 DIAGNOSIS — J9620 Acute and chronic respiratory failure, unspecified whether with hypoxia or hypercapnia: Secondary | ICD-10-CM | POA: Diagnosis present

## 2015-03-21 DIAGNOSIS — Y95 Nosocomial condition: Secondary | ICD-10-CM | POA: Diagnosis present

## 2015-03-21 DIAGNOSIS — J9602 Acute respiratory failure with hypercapnia: Secondary | ICD-10-CM | POA: Diagnosis not present

## 2015-03-21 LAB — COMPREHENSIVE METABOLIC PANEL
ALBUMIN: 4.7 g/dL (ref 3.5–5.0)
ALK PHOS: 75 U/L (ref 38–126)
ALT: 34 U/L (ref 14–54)
AST: 32 U/L (ref 15–41)
Anion gap: 8 (ref 5–15)
BILIRUBIN TOTAL: 0.8 mg/dL (ref 0.3–1.2)
BUN: 13 mg/dL (ref 6–20)
CALCIUM: 10 mg/dL (ref 8.9–10.3)
CO2: 33 mmol/L — ABNORMAL HIGH (ref 22–32)
Chloride: 100 mmol/L — ABNORMAL LOW (ref 101–111)
Creatinine, Ser: 0.79 mg/dL (ref 0.44–1.00)
GFR calc Af Amer: >60 mL/min (ref >60)
GFR calc non Af Amer: >60 mL/min (ref >60)
GLUCOSE: 128 mg/dL — AB (ref 65–99)
Potassium: 4.6 mmol/L (ref 3.5–5.1)
Sodium: 141 mmol/L (ref 135–145)
TOTAL PROTEIN: 7.9 g/dL (ref 6.5–8.1)

## 2015-03-21 LAB — CBC WITH DIFFERENTIAL/PLATELET
BASOS PCT: 0 %
Basophils Absolute: 0 10*3/uL (ref 0.0–0.1)
Eosinophils Absolute: 0.1 10*3/uL (ref 0.0–0.7)
Eosinophils Relative: 1 %
HEMATOCRIT: 43.3 % (ref 36.0–46.0)
HEMOGLOBIN: 13.6 g/dL (ref 12.0–15.0)
LYMPHS PCT: 14 %
Lymphs Abs: 1.6 10*3/uL (ref 0.7–4.0)
MCH: 30.4 pg (ref 26.0–34.0)
MCHC: 31.4 g/dL (ref 30.0–36.0)
MCV: 96.9 fL (ref 78.0–100.0)
MONOS PCT: 4 %
Monocytes Absolute: 0.5 10*3/uL (ref 0.1–1.0)
NEUTROS ABS: 9.2 10*3/uL — AB (ref 1.7–7.7)
NEUTROS PCT: 81 %
Platelets: 267 10*3/uL (ref 150–400)
RBC: 4.47 MIL/uL (ref 3.87–5.11)
RDW: 14.2 % (ref 11.5–15.5)
WBC: 11.4 10*3/uL — ABNORMAL HIGH (ref 4.0–10.5)

## 2015-03-21 LAB — TROPONIN I
Troponin I: <0.03 ng/mL (ref <0.031)
Troponin I: <0.03 ng/mL (ref <0.031)

## 2015-03-21 LAB — I-STAT CG4 LACTIC ACID, ED
LACTIC ACID, VENOUS: 2.74 mmol/L — AB (ref 0.5–2.0)
Lactic Acid, Venous: 2.05 mmol/L (ref 0.5–2.0)

## 2015-03-21 LAB — BRAIN NATRIURETIC PEPTIDE: B Natriuretic Peptide: 346 pg/mL — ABNORMAL HIGH (ref 0.0–100.0)

## 2015-03-21 MED ORDER — ADENOSINE 6 MG/2ML IV SOLN
6.0000 mg | Freq: Once | INTRAVENOUS | Status: AC
  Administered 2015-03-21: 6 mg via INTRAVENOUS

## 2015-03-21 MED ORDER — HEPARIN BOLUS VIA INFUSION
3500.0000 [IU] | Freq: Once | INTRAVENOUS | Status: AC
  Administered 2015-03-21: 3500 [IU] via INTRAVENOUS

## 2015-03-21 MED ORDER — SODIUM CHLORIDE 0.9 % IJ SOLN
3.0000 mL | Freq: Two times a day (BID) | INTRAMUSCULAR | Status: DC
  Administered 2015-03-22 – 2015-03-29 (×14): 3 mL via INTRAVENOUS

## 2015-03-21 MED ORDER — HYDROCORTISONE NA SUCCINATE PF 100 MG IJ SOLR: 50.0000 mg | Freq: Three times a day (TID) | INTRAMUSCULAR | Status: DC

## 2015-03-21 MED ORDER — ASPIRIN EC 81 MG PO TBEC
81.0000 mg | DELAYED_RELEASE_TABLET | Freq: Every day | ORAL | Status: DC
  Administered 2015-03-22: 81 mg via ORAL
  Filled 2015-03-21: qty 1

## 2015-03-21 MED ORDER — DEXTROSE 5 % IV SOLN
5.0000 mg/h | INTRAVENOUS | Status: DC
  Filled 2015-03-21: qty 100

## 2015-03-21 MED ORDER — SODIUM CHLORIDE 0.9 % IV BOLUS (SEPSIS)
1000.0000 mL | Freq: Once | INTRAVENOUS | Status: AC
  Administered 2015-03-21: 1000 mL via INTRAVENOUS

## 2015-03-21 MED ORDER — LEVALBUTEROL HCL 0.63 MG/3ML IN NEBU
0.6300 mg | INHALATION_SOLUTION | Freq: Four times a day (QID) | RESPIRATORY_TRACT | Status: DC
  Administered 2015-03-22 (×4): 0.63 mg via RESPIRATORY_TRACT
  Filled 2015-03-21 (×4): qty 3

## 2015-03-21 MED ORDER — HYDROCORTISONE NA SUCCINATE PF 100 MG IJ SOLR
50.0000 mg | Freq: Three times a day (TID) | INTRAMUSCULAR | Status: DC
  Administered 2015-03-21 – 2015-03-22 (×2): 50 mg via INTRAVENOUS
  Filled 2015-03-21 (×2): qty 2

## 2015-03-21 MED ORDER — DILTIAZEM HCL 100 MG IV SOLR
5.0000 mg/h | Freq: Once | INTRAVENOUS | Status: DC
  Administered 2015-03-21: 5 mg/h via INTRAVENOUS

## 2015-03-21 MED ORDER — BISACODYL 5 MG PO TBEC
5.0000 mg | DELAYED_RELEASE_TABLET | Freq: Every day | ORAL | Status: DC | PRN
  Administered 2015-03-26: 5 mg via ORAL
  Filled 2015-03-21: qty 1

## 2015-03-21 MED ORDER — METHYLPREDNISOLONE SODIUM SUCC 40 MG IJ SOLR
40.0000 mg | Freq: Four times a day (QID) | INTRAMUSCULAR | Status: DC
  Administered 2015-03-22 (×2): 40 mg via INTRAVENOUS
  Filled 2015-03-21 (×2): qty 1

## 2015-03-21 MED ORDER — DEXTROSE-NACL 5-0.9 % IV SOLN
INTRAVENOUS | Status: DC
  Administered 2015-03-22 (×2): via INTRAVENOUS

## 2015-03-21 MED ORDER — AMIODARONE IV BOLUS ONLY 150 MG/100ML
150.0000 mg | Freq: Once | INTRAVENOUS | Status: DC
  Filled 2015-03-21: qty 100

## 2015-03-21 MED ORDER — LORAZEPAM 1 MG PO TABS
1.0000 mg | ORAL_TABLET | Freq: Four times a day (QID) | ORAL | Status: DC | PRN
  Administered 2015-03-23: 1 mg via ORAL
  Filled 2015-03-21: qty 2

## 2015-03-21 MED ORDER — VITAMIN D (ERGOCALCIFEROL) 1.25 MG (50000 UNIT) PO CAPS: 50000.0000 [IU] | ORAL_CAPSULE | ORAL | Status: DC

## 2015-03-21 MED ORDER — DIGOXIN 0.25 MG/ML IJ SOLN
0.2500 mg | Freq: Four times a day (QID) | INTRAMUSCULAR | Status: AC
  Administered 2015-03-22 (×2): 0.25 mg via INTRAVENOUS
  Filled 2015-03-21 (×2): qty 2

## 2015-03-21 MED ORDER — ADENOSINE 6 MG/2ML IV SOLN
INTRAVENOUS | Status: AC
  Filled 2015-03-21: qty 6

## 2015-03-21 MED ORDER — METOPROLOL TARTRATE 1 MG/ML IV SOLN
INTRAVENOUS | Status: AC
Start: 2015-03-21 — End: 2015-03-22
  Filled 2015-03-21: qty 5

## 2015-03-21 MED ORDER — ALBUTEROL (5 MG/ML) CONTINUOUS INHALATION SOLN
10.0000 mg/h | INHALATION_SOLUTION | Freq: Once | RESPIRATORY_TRACT | Status: AC
  Administered 2015-03-21: 10 mg/h via RESPIRATORY_TRACT
  Filled 2015-03-21: qty 20

## 2015-03-21 MED ORDER — DILTIAZEM HCL 100 MG IV SOLR
INTRAVENOUS | Status: AC
  Filled 2015-03-21: qty 100

## 2015-03-21 MED ORDER — METOPROLOL TARTRATE 1 MG/ML IV SOLN
5.0000 mg | Freq: Once | INTRAVENOUS | Status: AC
  Administered 2015-03-21: 5 mg via INTRAVENOUS

## 2015-03-21 MED ORDER — ONDANSETRON HCL 4 MG/2ML IJ SOLN: 4.0000 mg | Freq: Four times a day (QID) | INTRAMUSCULAR | Status: DC | PRN

## 2015-03-21 MED ORDER — IOHEXOL 350 MG/ML SOLN
100.0000 mL | Freq: Once | INTRAVENOUS | Status: AC | PRN
  Administered 2015-03-21: 100 mL via INTRAVENOUS

## 2015-03-21 MED ORDER — DIGOXIN 0.25 MG/ML IJ SOLN
0.5000 mg | Freq: Once | INTRAMUSCULAR | Status: AC
  Administered 2015-03-21: 0.5 mg via INTRAVENOUS
  Filled 2015-03-21: qty 2

## 2015-03-21 MED ORDER — PANTOPRAZOLE SODIUM 40 MG PO TBEC
40.0000 mg | DELAYED_RELEASE_TABLET | Freq: Every day | ORAL | Status: DC
Start: 2015-03-22 — End: 2015-03-30
  Administered 2015-03-22 – 2015-03-30 (×9): 40 mg via ORAL
  Filled 2015-03-21 (×9): qty 1

## 2015-03-21 MED ORDER — HEPARIN (PORCINE) IN NACL 100-0.45 UNIT/ML-% IJ SOLN
1200.0000 [IU]/h | INTRAMUSCULAR | Status: DC
  Administered 2015-03-22: 1100 [IU]/h via INTRAVENOUS
  Filled 2015-03-21: qty 250

## 2015-03-21 MED ORDER — ALBUTEROL SULFATE (2.5 MG/3ML) 0.083% IN NEBU: 3.0000 mL | INHALATION_SOLUTION | Freq: Four times a day (QID) | RESPIRATORY_TRACT | Status: DC

## 2015-03-21 MED ORDER — DILTIAZEM LOAD VIA INFUSION
10.0000 mg | Freq: Once | INTRAVENOUS | Status: AC
  Administered 2015-03-21: 10 mg via INTRAVENOUS
  Filled 2015-03-21: qty 10

## 2015-03-21 MED ORDER — CHOLECALCIFEROL 10 MCG (400 UNIT) PO TABS
400.0000 [IU] | ORAL_TABLET | Freq: Every day | ORAL | Status: DC
  Administered 2015-03-22 – 2015-03-30 (×9): 400 [IU] via ORAL
  Filled 2015-03-21 (×10): qty 1

## 2015-03-21 MED ORDER — SODIUM CHLORIDE 0.9 % IV SOLN
INTRAVENOUS | Status: AC
  Administered 2015-03-22: via INTRAVENOUS

## 2015-03-21 MED ORDER — ONDANSETRON HCL 4 MG PO TABS: 4.0000 mg | ORAL_TABLET | Freq: Four times a day (QID) | ORAL | Status: DC | PRN

## 2015-03-21 MED ORDER — METHYLPREDNISOLONE SODIUM SUCC 125 MG IJ SOLR
125.0000 mg | Freq: Once | INTRAMUSCULAR | Status: AC
  Administered 2015-03-21: 125 mg via INTRAVENOUS
  Filled 2015-03-21: qty 2

## 2015-03-21 MED ORDER — BUDESONIDE-FORMOTEROL FUMARATE 160-4.5 MCG/ACT IN AERO
2.0000 | INHALATION_SPRAY | Freq: Every day | RESPIRATORY_TRACT | Status: DC | PRN
  Filled 2015-03-21: qty 6

## 2015-03-21 NOTE — Progress Notes (Signed)
ANTICOAGULATION CONSULT NOTE - Initial Consult  Pharmacy Consult for heparin Indication: atrial fibrillation  Allergies  Allergen Reactions  . Codeine Other (See Comments)    "jittery"    Patient Measurements: Height: 5\' 3"  (160 cm) Weight: 187 lb (84.823 kg) IBW/kg (Calculated) : 52.4 Heparin Dosing Weight: 71.3  Vital Signs: Temp: 98.7 F (37.1 C) (01/08 1721) Temp Source: Oral (01/08 1721) BP: 101/72 mmHg (01/08 2215) Pulse Rate: 53 (01/08 2215)  Labs:  Recent Labs  03/21/15 1745 03/21/15 2135  HGB 13.6  --   HCT 43.3  --   PLT 267  --   CREATININE 0.79  --   TROPONINI <0.03 <0.03    Estimated Creatinine Clearance: 67.6 mL/min (by C-G formula based on Cr of 0.79).   Medical History: Past Medical History  Diagnosis Date  . Hypertension   . Asthma   . History of breast cancer     right breast  . COPD (chronic obstructive pulmonary disease) ([facility])     Medications:  See medication history  Assessment: 70 yo lady to start heparin for afib. Goal of Therapy:  Heparin level 0.3-0.7 units/ml Monitor platelets by anticoagulation protocol: Yes   Plan:  Heparin bolus 3500 units and drip at 1100 units/hr Check heparin level and CBC daily Monitor for bleeding complications  Thanks for allowing pharmacy to be a part of this patient's care.  Excell Seltzer, PharmD Clinical Pharmacist  03/21/2015,10:40 PM

## 2015-03-21 NOTE — H&P (Signed)
[facility] History and Physical  Eileen [name] [id number] DOB: 10/09/1945    PCP:   Cleda Mccreedy, MD   Chief Complaint: SOB.   HPI: Eileen [name] is an 70 y.o. female  With hx of COPD on home oxgygen, and chronic steroid use, hx asthma, right breast cancer s/p right mastectomy, hx of HTN, but no DM or CHF, presented to the ER with progressive SOB over the past week.  She has been using her nebs more frequently, but hadn't used any OTC decongestant.  No CP, fever or chills.  She was found to have a Flutter with RVR at 160, after adenosine given x 1, revealing flutter waves.  Her CXR was clear.  IV Cardizem bolus of 10mg  x 2 and a drip, Carotid massage, and IV Bolus did not result in controlling of her HR.  EDP ordered Amiodarone, but her QTc was 580 ms.  She was given IV Dig and her rate finally came down to 110.  She felt better.   Rewiew of Systems:  Constitutional: Negative for malaise, fever and chills. No significant weight loss or weight gain Eyes: Negative for eye pain, redness and discharge, diplopia, visual changes, or flashes of light. ENMT: Negative for ear pain, hoarseness, nasal congestion, sinus pressure and sore throat. No headaches; tinnitus, drooling, or problem swallowing. Cardiovascular: Negative for chest pain, palpitations, diaphoresis,  and peripheral edema. ; No orthopnea, PND Respiratory: Negative for cough, hemoptysis, wheezing and stridor. No pleuritic chestpain. Gastrointestinal: Negative for nausea, vomiting, diarrhea, constipation, abdominal pain, melena, blood in stool, hematemesis, jaundice and rectal bleeding.    Genitourinary: Negative for frequency, dysuria, incontinence,flank pain and hematuria; Musculoskeletal: Negative for back pain and neck pain. Negative for swelling and trauma.;  Skin: . Negative for pruritus, rash, abrasions, bruising and skin lesion.; ulcerations Neuro: Negative for headache, lightheadedness and neck stiffness.  Negative for weakness, altered level of consciousness , altered mental status, extremity weakness, burning feet, involuntary movement, seizure and syncope.  Psych: negative for anxiety, depression, insomnia, tearfulness, panic attacks, hallucinations, paranoia, suicidal or homicidal ideation    Past Medical History  Diagnosis Date  . Hypertension   . Asthma   . History of breast cancer     right breast  . COPD (chronic obstructive pulmonary disease) ([place])     Past Surgical History  Procedure Laterality Date  . Mastectomy  2011    right breast  . Cesarean section    . Hernia repair      RIH    Medications:  HOME MEDS: Prior to Admission medications   Medication Sig Start Date End Date Taking? Authorizing Provider  albuterol (PROVENTIL) (2.5 MG/3ML) 0.083% nebulizer solution Take 2.5 mg by nebulization every 6 (six) hours as needed for wheezing or shortness of breath.   Yes Historical Provider, MD  Ascorbic Acid (VITAMIN C PO) Take 1 tablet by mouth daily.   Yes Historical Provider, MD  Cholecalciferol (VITAMIN D PO) Take 1 tablet by mouth daily.    Yes Historical Provider, MD  Fluticasone-Salmeterol (ADVAIR) 250-50 MCG/DOSE AEPB Inhale 1 puff into the lungs 2 (two) times daily.   Yes Historical Provider, MD  ibuprofen (ADVIL,MOTRIN) 200 MG tablet Take 400 mg by mouth every 6 (six) hours as needed for moderate pain.   Yes Historical Provider, MD  OXYGEN Inhale 2 L into the lungs daily.   Yes Historical Provider, MD  predniSONE (DELTASONE) 5 MG tablet Take 10 mg by mouth every other day.  02/24/11  Yes  Historical Provider, MD  PROAIR HFA 108 (90 BASE) MCG/ACT inhaler Inhale 2 puffs into the lungs every 6 (six) hours as needed for wheezing or shortness of breath.  03/06/11  Yes Historical Provider, MD  SPIRIVA HANDIHALER 18 MCG inhalation capsule Place 18 mcg into inhaler and inhale daily as needed (for shortness of breath).  03/09/11  Yes Historical Provider, MD  SYMBICORT 160-4.5  MCG/ACT inhaler Inhale 2 puffs into the lungs daily as needed (for shortness of breath).  12/14/10  Yes Historical Provider, MD  telmisartan-hydrochlorothiazide (MICARDIS HCT) 80-12.5 MG per tablet Take 1 tablet by mouth daily.     Yes Historical Provider, MD  Vitamin D, Ergocalciferol, (DRISDOL) 50000 UNITS CAPS capsule Take 50,000 Units by mouth every 30 (thirty) days.   Yes Historical Provider, MD  levofloxacin (LEVAQUIN) 750 MG tablet Take 1 tablet (750 mg total) by mouth daily. Patient not taking: Reported on 03/21/2015 12/22/14   [name], MD  predniSONE (DELTASONE) 10 MG tablet Take 1 tablet (10 mg total) by mouth daily with breakfast. Take 6 tablets today and then decrease by 1 tablet daily until none are left. Patient not taking: Reported on 03/21/2015 12/22/14   [name], MD     Allergies:  Allergies  Allergen Reactions  . Codeine Other (See Comments)    "jittery"    Social History:   reports that she quit smoking about 21 years ago. She does not have any smokeless tobacco history on file. She reports that she does not drink alcohol or use illicit drugs.  Family History: Family History  Problem Relation Age of Onset  . Cancer Mother     lung  . Diabetes Brother      Physical Exam: Filed Vitals:   03/21/15 2130 03/21/15 2145 03/21/15 2200 03/21/15 2215  BP: 121/68 110/86 113/88 101/72  Pulse: 163 161 161 53  Temp:      TempSrc:      Resp: 22 23 25 20   Height:      Weight:      SpO2: 91% 93% 99% 98%   Blood pressure 101/72, pulse 53, temperature 98.7 F (37.1 C), temperature source Oral, resp. rate 20, height 5\' 3"  (1.6 m), weight 84.823 kg (187 lb), SpO2 98 %.  GEN:  Pleasant patient lying in the stretcher in no acute distress; cooperative with exam. PSYCH:  alert and oriented x4; does not appear anxious or depressed; affect is appropriate. HEENT: Mucous membranes pink and anicteric; PERRLA; EOM intact; no cervical lymphadenopathy  nor thyromegaly or carotid bruit; no JVD; There were no stridor. Neck is very supple. Breasts:: Not examined CHEST WALL: No tenderness CHEST: Normal respiration, clear to auscultation bilaterally.  HEART: Regular rate and rhythm.  There are no murmur, rub, or gallops.   BACK: No kyphosis or scoliosis; no CVA tenderness ABDOMEN: soft and non-tender; no masses, no organomegaly, normal abdominal bowel sounds; no pannus; no intertriginous candida. There is no rebound and no distention. Rectal Exam: Not done EXTREMITIES: No bone or joint deformity; age-appropriate arthropathy of the hands and knees; no edema; no ulcerations.  There is no calf tenderness. Genitalia: not examined PULSES: 2+ and symmetric SKIN: Normal hydration no rash or ulceration CNS: Cranial nerves 2-12 grossly intact no focal lateralizing neurologic deficit.  Speech is fluent; uvula elevated with phonation, facial symmetry and tongue midline. DTR are normal bilaterally, cerebella exam is intact, barbinski is negative and strengths are equaled bilaterally.  No sensory loss.   Labs  on Admission:  Basic Metabolic Panel:  Recent Labs Lab 03/21/15 1745  NA 141  K 4.6  CL 100*  CO2 33*  GLUCOSE 128*  BUN 13  CREATININE 0.79  CALCIUM 10.0   Liver Function Tests:  Recent Labs Lab 03/21/15 1745  AST 32  ALT 34  ALKPHOS 75  BILITOT 0.8  PROT 7.9  ALBUMIN 4.7   No results for input(s): LIPASE, AMYLASE in the last 168 hours. No results for input(s): AMMONIA in the last 168 hours. CBC:  Recent Labs Lab 03/21/15 1745  WBC 11.4*  NEUTROABS 9.2*  HGB 13.6  HCT 43.3  MCV 96.9  PLT 267   Cardiac Enzymes:  Recent Labs Lab 03/21/15 1745 03/21/15 2135  TROPONINI <0.03 <0.03   Radiological Exams on Admission: Dg Chest 2 View  03/21/2015  CLINICAL DATA:  Shortness of breath.  Left upper quadrant pain. EXAM: CHEST  2 VIEW COMPARISON:  PA and lateral chest 12/20/2014. FINDINGS: The lungs are clear. Heart size is  upper normal. No pneumothorax or pleural effusion. No focal bony abnormality. IMPRESSION: No acute disease. Electronically Signed   By: Inge Rise M.D.   On: 03/21/2015 18:57    EKG: Independently reviewed.    Assessment/Plan Present on Admission:  . COPD exacerbation ([place]) . DCIS (ductal carcinoma in situ) of breast, right, 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, Arimadex . New onset atrial flutter ([place]) . Obesity . Atrial flutter with rapid ventricular response ([facility])  PLAN: 1/  New onset of Aflutter with RVR:  Will continue with IV Diltiazem and load her with DIg for now.  Will avoid amiodarone given elevated Qtc to 580 ms.  Tx underlying COPD exacerbation.  Check TSH (sister with hyperthyrodiism), and obtain ECHO and cardiology consultation.  Start IV Heparin, along with ASA and give PPI.  Given hx of cancer, obesity, will obtain CTA to r/out PE as cause of afib/flutter. Check Mag level.  Will cycle troponins given prolonged tachycardia.   2/  COPD exacerbation:  She was given IV stress dose steroids.  Will give 2 more, and continue IV solumedrol for COPD exacerbation.  Use Xopenex neb.   3/ HTN:  :Given hypotension in ER, will hold meds including diuretics.     Code Status: FULL CODE>    Orvan Falconer, MD. FACP [facility] Pager [phone] 7pm to 7am.  03/21/2015, 10:53 PM

## 2015-03-21 NOTE — ED Notes (Signed)
Heart rate 160's.  Pt continues to deny any chest pain.   Continues to be sob.

## 2015-03-21 NOTE — ED Notes (Signed)
Assisted pt with BSC. Urinated without difficulty.

## 2015-03-21 NOTE — ED Provider Notes (Signed)
CSN: [id number]     Arrival date & time 03/21/15  1713 History   First MD Initiated Contact with Patient 03/21/15 1736     Chief Complaint  Patient presents with  . Shortness of Breath     (Consider location/radiation/quality/duration/timing/severity/associated sxs/prior Treatment) HPI   Eileen [name] is a 70 y.o. femaleHer presents for evaluation of shortness of breath for 3 days. She is using her usual medications at home without relief. She denies chest pain. She has dyspnea on exertion. She denies fever, chills, cough, nausea, vomiting, dysuria, or constipation. She is taking all of her medications as prescribed. There are no other known modifying factors.   Past Medical History  Diagnosis Date  . Hypertension   . Asthma   . History of breast cancer     right breast  . COPD (chronic obstructive pulmonary disease) ([place])    Past Surgical History  Procedure Laterality Date  . Mastectomy  2011    right breast  . Cesarean section    . Hernia repair      RIH   Family History  Problem Relation Age of Onset  . Cancer Mother     lung  . Diabetes Brother    Social History  Substance Use Topics  . Smoking status: Former Smoker    Quit date: 03/13/1994  . Smokeless tobacco: None  . Alcohol Use: No   OB History    Gravida Para Term Preterm AB TAB SAB Ectopic Multiple Living            5     Review of Systems  All other systems reviewed and are negative.     Allergies  Codeine  Home Medications   Prior to Admission medications   Medication Sig Start Date End Date Taking? Authorizing Provider  albuterol (PROVENTIL) (2.5 MG/3ML) 0.083% nebulizer solution Take 2.5 mg by nebulization every 6 (six) hours as needed for wheezing or shortness of breath.   Yes Historical Provider, MD  Ascorbic Acid (VITAMIN C PO) Take 1 tablet by mouth daily.   Yes Historical Provider, MD  Cholecalciferol (VITAMIN D PO) Take 1 tablet by mouth daily.    Yes Historical Provider, MD   Fluticasone-Salmeterol (ADVAIR) 250-50 MCG/DOSE AEPB Inhale 1 puff into the lungs 2 (two) times daily.   Yes Historical Provider, MD  ibuprofen (ADVIL,MOTRIN) 200 MG tablet Take 400 mg by mouth every 6 (six) hours as needed for moderate pain.   Yes Historical Provider, MD  OXYGEN Inhale 2 L into the lungs daily.   Yes Historical Provider, MD  predniSONE (DELTASONE) 5 MG tablet Take 10 mg by mouth every other day.  02/24/11  Yes Historical Provider, MD  PROAIR HFA 108 (90 BASE) MCG/ACT inhaler Inhale 2 puffs into the lungs every 6 (six) hours as needed for wheezing or shortness of breath.  03/06/11  Yes Historical Provider, MD  SPIRIVA HANDIHALER 18 MCG inhalation capsule Place 18 mcg into inhaler and inhale daily as needed (for shortness of breath).  03/09/11  Yes Historical Provider, MD  SYMBICORT 160-4.5 MCG/ACT inhaler Inhale 2 puffs into the lungs daily as needed (for shortness of breath).  12/14/10  Yes Historical Provider, MD  telmisartan-hydrochlorothiazide (MICARDIS HCT) 80-12.5 MG per tablet Take 1 tablet by mouth daily.     Yes Historical Provider, MD  Vitamin D, Ergocalciferol, (DRISDOL) 50000 UNITS CAPS capsule Take 50,000 Units by mouth every 30 (thirty) days.   Yes Historical Provider, MD  levofloxacin (LEVAQUIN) 750 MG tablet  Take 1 tablet (750 mg total) by mouth daily. Patient not taking: Reported on 03/21/2015 12/22/14   [name], MD  predniSONE (DELTASONE) 10 MG tablet Take 1 tablet (10 mg total) by mouth daily with breakfast. Take 6 tablets today and then decrease by 1 tablet daily until none are left. Patient not taking: Reported on 03/21/2015 12/22/14   [name], MD   BP 103/85 mmHg  Pulse 164  Temp(Src) 98.7 F (37.1 C) (Oral)  Resp 23  Ht 5\' 3"  (1.6 m)  Wt 187 lb (84.823 kg)  BMI 33.13 kg/m2  SpO2 94% Physical Exam  Constitutional: She is oriented to person, place, and time. She appears well-developed and well-nourished.  HENT:  Head:  Normocephalic and atraumatic.  Right Ear: External ear normal.  Left Ear: External ear normal.  Eyes: Conjunctivae and EOM are normal. Pupils are equal, round, and reactive to light.  Neck: Normal range of motion and phonation normal. Neck supple.  Cardiovascular: Regular rhythm and normal heart sounds.   tachycardic  Pulmonary/Chest: Effort normal. She exhibits no bony tenderness.  Poor air movement bilaterally, with increased work of breathing. No overt respiratory distress.  Abdominal: Soft. There is no tenderness.  Musculoskeletal: Normal range of motion. She exhibits no edema or tenderness.  Neurological: She is alert and oriented to person, place, and time. No cranial nerve deficit or sensory deficit. She exhibits normal muscle tone. Coordination normal.  Skin: Skin is warm, dry and intact.  Psychiatric: She has a normal mood and affect. Her behavior is normal. Judgment and thought content normal.  Nursing note and vitals reviewed.   ED Course  Procedures (including critical care time)  Medications  dextrose 5 % with diltiazem (CARDIZEM) ADS Med (0 mg/hr  Hold 03/21/15 2021)  diltiazem (CARDIZEM) 1 mg/mL load via infusion 10 mg (10 mg Intravenous Given 03/21/15 2021)    And  diltiazem (CARDIZEM) 100 mg in dextrose 5 % 100 mL (1 mg/mL) infusion (10 mg/hr Intravenous Rate/Dose Change 03/21/15 2111)  sodium chloride 0.9 % bolus 1,000 mL (0 mLs Intravenous Stopped 03/21/15 1908)  albuterol (PROVENTIL,VENTOLIN) solution continuous neb (10 mg/hr Nebulization Given 03/21/15 1841)  methylPREDNISolone sodium succinate (SOLU-MEDROL) 125 mg/2 mL injection 125 mg (125 mg Intravenous Given 03/21/15 1800)  metoprolol (LOPRESSOR) injection 5 mg (5 mg Intravenous Given 03/21/15 1931)  adenosine (ADENOCARD) 6 MG/2ML injection 6 mg (6 mg Intravenous Given 03/21/15 2011)    Patient Vitals for the past 24 hrs:  BP Temp Temp src Pulse Resp SpO2 Height Weight  03/21/15 2100 103/85 mmHg - - (!) 164 23 94 % - -   03/21/15 2045 109/90 mmHg - - (!) 160 24 94 % - -  03/21/15 2030 102/78 mmHg - - (!) 160 24 94 % - -  03/21/15 2015 113/90 mmHg - - (!) 162 (!) 27 95 % - -  03/21/15 2000 (!) 85/53 mmHg - - (!) 164 (!) 28 97 % - -  03/21/15 1945 104/91 mmHg - - (!) 162 23 93 % - -  03/21/15 1930 110/86 mmHg - - (!) 164 19 97 % - -  03/21/15 1915 114/88 mmHg - - (!) 164 18 96 % - -  03/21/15 1900 111/79 mmHg - - (!) 165 18 97 % - -  03/21/15 1845 125/77 mmHg - - (!) 163 15 98 % - -  03/21/15 1841 - - - (!) 165 14 97 % - -  03/21/15 1830 123/100 mmHg - - Marland Kitchen)  167 20 96 % - -  03/21/15 1815 109/88 mmHg - - (!) 165 16 98 % - -  03/21/15 1800 114/96 mmHg - - (!) 166 25 95 % - -  03/21/15 1745 115/85 mmHg - - (!) 166 20 93 % - -  03/21/15 1721 128/77 mmHg 98.7 F (37.1 C) Oral (!) 160 26 94 % 5\' 3"  (1.6 m) 187 lb (84.823 kg)   19:25-After continuous nebulizer, patient had somewhat improved air movement, now with mild wheezing appreciated. Heart rate persistently elevated at 165, very regular. Lopressor ordered and given.  19:40- no significant change. The heart rate with Lopressor. IV fluid boluses continued to be infusing.  20:05- . Trial of adenosine, 6 mg given. Transient 5 second period of decreased ventricular heart rate to around 100 with evident. Atrial tachycardia underlying at around 300/m. Marland Kitchen This indicates that she is in atrial flutter, with a 2 to one block. Cardizem bolus and drip ordered for rate control and to improve her hemodynamic state. We'll continue IV fluid bolus to 3 L total.  9:35 PM Reevaluation with update and discussion. After initial assessment and treatment, an updated evaluation reveals she is sitting up comfortably, and states that she feels better that way. Heart rate currently 160. Blood pressure [id number] systolic. She denies chest pain at this time. Cardizem drip now at 12 and half milligrams per hour. Will order amiodarone, 2 give additional control. Avory Mimbs L   9:33 PM-Consult  complete with Hospitalist. Patient case explained and discussed. He agrees to admit patient for further evaluation and treatment. Call ended at 21:45  This patients CHA2DS2-VASc Score and unadjusted Ischemic Stroke Rate (% per year) is equal to 3.2 % stroke rate/year from a score of 3  Above score calculated as 1 point each if present [CHF, HTN, DM, Vascular=MI/PAD/Aortic Plaque, Age if 65-74, or Female] Above score calculated as 2 points each if present [Age > 75, or Stroke/TIA/TE]  CRITICAL CARE Performed by: Daleen Bo L Total critical care time: 75 minutes Critical care time was exclusive of separately billable procedures and treating other patients. Critical care was necessary to treat or prevent imminent or life-threatening deterioration. Critical care was time spent personally by me on the following activities: development of treatment plan with patient and/or surrogate as well as nursing, discussions with consultants, evaluation of patient's response to treatment, examination of patient, obtaining history from patient or surrogate, ordering and performing treatments and interventions, ordering and review of laboratory studies, ordering and review of radiographic studies, pulse oximetry and re-evaluation of patient's condition.   Labs Review Labs Reviewed  CBC WITH DIFFERENTIAL/PLATELET - Abnormal; Notable for the following:    WBC 11.4 (*)    Neutro Abs 9.2 (*)    All other components within normal limits  COMPREHENSIVE METABOLIC PANEL - Abnormal; Notable for the following:    Chloride 100 (*)    CO2 33 (*)    Glucose, Bld 128 (*)    All other components within normal limits  BRAIN NATRIURETIC PEPTIDE - Abnormal; Notable for the following:    B Natriuretic Peptide 346.0 (*)    All other components within normal limits  I-STAT CG4 LACTIC ACID, ED - Abnormal; Notable for the following:    Lactic Acid, Venous 2.05 (*)    All other components within normal limits  TROPONIN I   TROPONIN I  I-STAT CG4 LACTIC ACID, ED    Imaging Review Dg Chest 2 View  03/21/2015  CLINICAL DATA:  Shortness of  breath.  Left upper quadrant pain. EXAM: CHEST  2 VIEW COMPARISON:  PA and lateral chest 12/20/2014. FINDINGS: The lungs are clear. Heart size is upper normal. No pneumothorax or pleural effusion. No focal bony abnormality. IMPRESSION: No acute disease. Electronically Signed   By: Inge Rise M.D.   On: 03/21/2015 18:57   I have personally reviewed and evaluated these images and lab results as part of my medical decision-making.   EKG Interpretation   Date/Time:  Sunday March 21 2015 17:30:45 EST Ventricular Rate:  166 PR Interval:  142 QRS Duration: 176 QT Interval:  351 QTC Calculation: 583 R Axis:   -60 Text Interpretation:  Extreme tachycardia with wide complex, no further  rhythm analysis attempted Baseline wander in lead(s) V1 Since last tracing  rate faster Confirmed by Tana Trefry  MD, Kela Baccari [id number]) on 03/21/2015 5:37:10 PM      MDM   Final diagnoses:  COPD exacerbation ([place])  Atypical atrial flutter ([facility])    Suspect COPD exacerbation was secondary atrial flutter, rate has been difficult to control here. Patient remains relatively asymptomatic after treatment. She will require admission for further monitoring treatment.She has elevated chads score and will require anticoagulation.  Nursing Notes Reviewed/ Care Coordinated Applicable Imaging Reviewed Interpretation of Laboratory Data incorporated into ED treatment   Plan: Admit   Daleen Bo, MD 03/23/15 1129

## 2015-03-21 NOTE — Progress Notes (Signed)
Albuterol ordered changing to xopenex as per DR Marin Comment  Progress note.

## 2015-03-21 NOTE — ED Notes (Addendum)
PT c/o worsening in SOB x3 days. PT has dyspnea at rest and tachycardia. PT denies any chest pain but states swelling to bilateral lower extremities x3 days. PT on 2L 02 via n/c normally at night but continuously the past 3 days.

## 2015-03-21 NOTE — ED Notes (Signed)
A second bolus of  10 mg iv Cardizem  Given per Dr Orvan Falconer

## 2015-03-22 ENCOUNTER — Inpatient Hospital Stay (HOSPITAL_COMMUNITY): Payer: Medicare Other

## 2015-03-22 DIAGNOSIS — J441 Chronic obstructive pulmonary disease with (acute) exacerbation: Secondary | ICD-10-CM

## 2015-03-22 DIAGNOSIS — Z7189 Other specified counseling: Secondary | ICD-10-CM

## 2015-03-22 DIAGNOSIS — I4891 Unspecified atrial fibrillation: Secondary | ICD-10-CM

## 2015-03-22 DIAGNOSIS — I4892 Unspecified atrial flutter: Principal | ICD-10-CM

## 2015-03-22 LAB — CBC
HEMATOCRIT: 37.7 % (ref 36.0–46.0)
HEMOGLOBIN: 11.6 g/dL — AB (ref 12.0–15.0)
MCH: 29.8 pg (ref 26.0–34.0)
MCHC: 30.8 g/dL (ref 30.0–36.0)
MCV: 96.9 fL (ref 78.0–100.0)
Platelets: 239 10*3/uL (ref 150–400)
RBC: 3.89 MIL/uL (ref 3.87–5.11)
RDW: 14.5 % (ref 11.5–15.5)
WBC: 11.1 10*3/uL — AB (ref 4.0–10.5)

## 2015-03-22 LAB — MAGNESIUM: MAGNESIUM: 1.8 mg/dL (ref 1.7–2.4)

## 2015-03-22 LAB — MRSA PCR SCREENING: MRSA by PCR: NEGATIVE

## 2015-03-22 LAB — TROPONIN I: Troponin I: <0.03 ng/mL (ref <0.031)

## 2015-03-22 LAB — HEPARIN LEVEL (UNFRACTIONATED): HEPARIN UNFRACTIONATED: 0.25 [IU]/mL — AB (ref 0.30–0.70)

## 2015-03-22 LAB — TSH: TSH: 0.496 u[IU]/mL (ref 0.350–4.500)

## 2015-03-22 MED ORDER — AMIODARONE LOAD VIA INFUSION
150.0000 mg | Freq: Once | INTRAVENOUS | Status: AC
  Administered 2015-03-22: 150 mg via INTRAVENOUS
  Filled 2015-03-22: qty 83.34

## 2015-03-22 MED ORDER — AMIODARONE HCL IN DEXTROSE 360-4.14 MG/200ML-% IV SOLN
60.0000 mg/h | INTRAVENOUS | Status: AC
Start: 2015-03-22 — End: 2015-03-22
  Administered 2015-03-22: 60 mg/h via INTRAVENOUS
  Filled 2015-03-22 (×2): qty 200

## 2015-03-22 MED ORDER — APIXABAN 5 MG PO TABS
5.0000 mg | ORAL_TABLET | Freq: Two times a day (BID) | ORAL | Status: DC
  Administered 2015-03-22 – 2015-03-30 (×17): 5 mg via ORAL
  Filled 2015-03-22 (×18): qty 1

## 2015-03-22 MED ORDER — PREDNISONE 20 MG PO TABS
60.0000 mg | ORAL_TABLET | Freq: Every day | ORAL | Status: DC
  Administered 2015-03-22 – 2015-03-23 (×2): 60 mg via ORAL
  Filled 2015-03-22 (×2): qty 3

## 2015-03-22 MED ORDER — ACETAMINOPHEN 325 MG PO TABS
650.0000 mg | ORAL_TABLET | ORAL | Status: DC | PRN
  Administered 2015-03-22 – 2015-03-29 (×10): 650 mg via ORAL
  Filled 2015-03-22 (×10): qty 2

## 2015-03-22 MED ORDER — AMIODARONE HCL IN DEXTROSE 360-4.14 MG/200ML-% IV SOLN
30.0000 mg/h | INTRAVENOUS | Status: DC
  Administered 2015-03-23 – 2015-03-24 (×6): 30 mg/h via INTRAVENOUS
  Filled 2015-03-22 (×5): qty 200

## 2015-03-22 MED ORDER — DILTIAZEM HCL 100 MG IV SOLR
5.0000 mg/h | INTRAVENOUS | Status: DC
  Administered 2015-03-22: 5 mg/h via INTRAVENOUS
  Administered 2015-03-23 (×2): 10 mg/h via INTRAVENOUS
  Administered 2015-03-24 (×2): 5 mg/h via INTRAVENOUS
  Filled 2015-03-22 (×6): qty 100

## 2015-03-22 MED ORDER — DIGOXIN 125 MCG PO TABS
0.1250 mg | ORAL_TABLET | Freq: Every day | ORAL | Status: DC
  Administered 2015-03-22 – 2015-03-30 (×9): 0.125 mg via ORAL
  Filled 2015-03-22 (×9): qty 1

## 2015-03-22 MED ORDER — MAGNESIUM OXIDE 400 (241.3 MG) MG PO TABS
400.0000 mg | ORAL_TABLET | Freq: Every day | ORAL | Status: DC
  Administered 2015-03-22 – 2015-03-30 (×9): 400 mg via ORAL
  Filled 2015-03-22 (×9): qty 1

## 2015-03-22 MED ORDER — DILTIAZEM LOAD VIA INFUSION
5.0000 mg | Freq: Once | INTRAVENOUS | Status: AC
  Administered 2015-03-22: 5 mg via INTRAVENOUS
  Filled 2015-03-22: qty 5

## 2015-03-22 MED ORDER — DILTIAZEM HCL 30 MG PO TABS
30.0000 mg | ORAL_TABLET | Freq: Four times a day (QID) | ORAL | Status: DC
  Administered 2015-03-22: 30 mg via ORAL
  Filled 2015-03-22 (×2): qty 1

## 2015-03-22 MED ORDER — PNEUMOCOCCAL VAC POLYVALENT 25 MCG/0.5ML IJ INJ: 0.5000 mL | INJECTION | INTRAMUSCULAR | Status: DC

## 2015-03-22 NOTE — Progress Notes (Signed)
Patients heart rate sustaining between 125 and 135, but as high as 155. Dr. Verlon Au notified. No new orders at this time. Will continue to monitor.

## 2015-03-22 NOTE — Discharge Instructions (Signed)
Apixaban oral tablets °What is this medicine? °APIXABAN (a PIX a ban) is an anticoagulant (blood thinner). It is used to lower the chance of stroke in people with a medical condition called atrial fibrillation. It is also used to treat or prevent blood clots in the lungs or in the veins. °This medicine may be used for other purposes; ask your health care provider or pharmacist if you have questions. °What should I tell my health care provider before I take this medicine? °They need to know if you have any of these conditions: °-bleeding disorders °-bleeding in the brain °-blood in your stools (black or tarry stools) or if you have blood in your vomit °-history of stomach bleeding °-kidney disease °-liver disease °-mechanical heart valve °-an unusual or allergic reaction to apixaban, other medicines, foods, dyes, or preservatives °-pregnant or trying to get pregnant °-breast-feeding °How should I use this medicine? °Take this medicine by mouth with a glass of water. Follow the directions on the prescription label. You can take it with or without food. If it upsets your stomach, take it with food. Take your medicine at regular intervals. Do not take it more often than directed. Do not stop taking except on your doctor's advice. Stopping this medicine may increase your risk of a blot clot. Be sure to refill your prescription before you run out of medicine. °Talk to your pediatrician regarding the use of this medicine in children. Special care may be needed. °Overdosage: If you think you have taken too much of this medicine contact a poison control center or emergency room at once. °NOTE: This medicine is only for you. Do not share this medicine with others. °What if I miss a dose? °If you miss a dose, take it as soon as you can. If it is almost time for your next dose, take only that dose. Do not take double or extra doses. °What may interact with this medicine? °This medicine may interact with the following: °-aspirin  and aspirin-like medicines °-certain medicines for fungal infections like ketoconazole and itraconazole °-certain medicines for seizures like carbamazepine and phenytoin °-certain medicines that treat or prevent blood clots like warfarin, enoxaparin, and dalteparin °-clarithromycin °-NSAIDs, medicines for pain and inflammation, like ibuprofen or naproxen °-rifampin °-ritonavir °-St. John's wort °This list may not describe all possible interactions. Give your health care provider a list of all the medicines, herbs, non-prescription drugs, or dietary supplements you use. Also tell them if you smoke, drink alcohol, or use illegal drugs. Some items may interact with your medicine. °What should I watch for while using this medicine? °Notify your doctor or health care professional and seek emergency treatment if you develop breathing problems; changes in vision; chest pain; severe, sudden headache; pain, swelling, warmth in the leg; trouble speaking; sudden numbness or weakness of the face, arm, or leg. These can be signs that your condition has gotten worse. °If you are going to have surgery, tell your doctor or health care professional that you are taking this medicine. °Tell your health care professional that you use this medicine before you have a spinal or epidural procedure. Sometimes people who take this medicine have bleeding problems around the spine when they have a spinal or epidural procedure. This bleeding is very rare. If you have a spinal or epidural procedure while on this medicine, call your health care professional immediately if you have back pain, numbness or tingling (especially in your legs and feet), muscle weakness, paralysis, or loss of bladder or bowel   control. °Avoid sports and activities that might cause injury while you are using this medicine. Severe falls or injuries can cause unseen bleeding. Be careful when using sharp tools or knives. Consider using an electric razor. Take special care  brushing or flossing your teeth. Report any injuries, bruising, or red spots on the skin to your doctor or health care professional. °What side effects may I notice from receiving this medicine? °Side effects that you should report to your doctor or health care professional as soon as possible: °-allergic reactions like skin rash, itching or hives, swelling of the face, lips, or tongue °-signs and symptoms of bleeding such as bloody or black, tarry stools; red or dark-brown urine; spitting up blood or brown material that looks like coffee grounds; red spots on the skin; unusual bruising or bleeding from the eye, gums, or nose °This list may not describe all possible side effects. Call your doctor for medical advice about side effects. You may report side effects to FDA at 1-800-FDA-1088. °Where should I keep my medicine? °Keep out of the reach of children. °Store at room temperature between 20 and 25 degrees C (68 and 77 degrees F). Throw away any unused medicine after the expiration date. °NOTE: This sheet is a summary. It may not cover all possible information. If you have questions about this medicine, talk to your doctor, pharmacist, or health care provider. °  °© 2016, [employer]. (2012-11-01 11:59:24) ° °

## 2015-03-22 NOTE — Progress Notes (Signed)
NP Jory Sims notified of pt HR sustaining 120-150s Aflutter. Oral dose cardizem given, IV gtt d/c'd. Order to give 5mg  bolus. EKG complete, Echo complete, not resulted. BP stable. Will cont to monitor.  Eileen [name] L

## 2015-03-22 NOTE — Progress Notes (Signed)
Eileen [name] [id number] DOB: Dec 14, 1945 DOA: 03/21/2015 PCP: Cleda Mccreedy, MD  Brief narrative: 70 y/o ? COPD + Emphysema severe [hospitalized last 12/2014 for acute exacerbation]-apparently on chr steroids DCIS R breast s/p Mastectomy 10/2009 s/p 5 yrs  Arimidex8/206 osteoprosis previously on Prolia Htn  Admitted to [facility] SDU 03/21/15 with Severe COPd exacerbation and new onset Aflutter with prolonged QTc 580  Cardiology consulted   Past medical history-As per Problem list Chart reviewed as below-   Consultants:  Cardiology  Procedures:    Antibiotics:  nad   Subjective   Well had a cough ~ 2 wk ago. Prescribed Z-Pak for this No sputum no chest pain No nausea No vomiting Has never had any type of heart issue in the past and has been fair   Objective    Interim History:   Telemetry: Noted atrial flutter 4:1 which is ranging between 1:30 and 140   Objective: Filed Vitals:   03/22/15 0400 03/22/15 0430 03/22/15 0500 03/22/15 0600  BP: 110/61 105/70 101/73 98/66  Pulse: 86 35 85 82  Temp:      TempSrc:      Resp: 26 26 20 24   Height:      Weight:      SpO2: 95% 96% 97% 98%    Intake/Output Summary (Last 24 hours) at 03/22/15 0735 Last data filed at 03/22/15 0400  Gross per 24 hour  Intake 560.53 ml  Output      0 ml  Net 560.53 ml    Exam:  General: EOMI NCAT pleasant alert oriented Cardiovascular: S1-S2 no murmur rub or gallop tachycardic irregularly irregular Respiratory: Clinically clear no added sound no wheeze no rhonchi Abdomen: Soft nontender nondistended no rebound or guarding Skin no lower extremity edema Neuro grossly intact  Data Reviewed: Basic Metabolic Panel:  Recent Labs Lab 03/21/15 1745 03/21/15 2340  NA 141  --   K 4.6  --   CL 100*  --   CO2 33*  --   GLUCOSE 128*  --   BUN 13  --   CREATININE 0.79  --   CALCIUM 10.0  --   MG  --  1.8   Liver Function Tests:  Recent Labs Lab 03/21/15 1745  AST 32    ALT 34  ALKPHOS 75  BILITOT 0.8  PROT 7.9  ALBUMIN 4.7   No results for input(s): LIPASE, AMYLASE in the last 168 hours. No results for input(s): AMMONIA in the last 168 hours. CBC:  Recent Labs Lab 03/21/15 1745 03/22/15 0443  WBC 11.4* 11.1*  NEUTROABS 9.2*  --   HGB 13.6 11.6*  HCT 43.3 37.7  MCV 96.9 96.9  PLT 267 239   Cardiac Enzymes:  Recent Labs Lab 03/21/15 1745 03/21/15 2135 03/21/15 2340 03/22/15 0443  TROPONINI <0.03 <0.03 <0.03 <0.03   BNP: Invalid input(s): POCBNP CBG: No results for input(s): GLUCAP in the last 168 hours.  No results found for this or any previous visit (from the past 240 hour(s)).   Studies:              All Imaging reviewed and is as per above notation   Scheduled Meds: . sodium chloride   Intravenous STAT  . aspirin EC  81 mg Oral Daily  . cholecalciferol  400 Units Oral Daily  . diltiazem (CARDIZEM) infusion      . digoxin  0.25 mg Intravenous Q6H  . hydrocortisone sod succinate (SOLU-CORTEF) inj  50 mg Intravenous Q8H  .  levalbuterol  0.63 mg Nebulization Q6H  . methylPREDNISolone (SOLU-MEDROL) injection  40 mg Intravenous Q6H  . pantoprazole  40 mg Oral Q0600  . [START ON 03/23/2015] pneumococcal 23 valent vaccine  0.5 mL Intramuscular Tomorrow-1000  . sodium chloride  3 mL Intravenous Q12H  . Vitamin D (Ergocalciferol)  50,000 Units Oral Q30 days   Continuous Infusions: . dextrose 5 % and 0.9% NaCl 75 mL/hr at 03/22/15 0100  . diltiazem (CARDIZEM) infusion 5 mg/hr (03/22/15 0400)  . heparin 1,100 Units/hr (03/22/15 0400)     Assessment/Plan:  1. Atrial flutter Chad2Vasc2 score-3.  Currently on Cardizem 5 mg per hour, digoxin loaded 0.25 every 6. We will need close follow-up as an inpatient by cardiology whose been consulted. Follow echocardiogram.? Flutter ablation. Continue heparin for now. Rest as per cardiology. Note that the patient's TSH is 0.49, keep magnesium >2 and replace orally 2. Prolonged Qtc-monitor  on tele. 3. Severe COPD/emphysema-pulmonary hypertension and right atrial dilatation may cause atrial flutter. See above discussion. No wheeze currently so changed to Xopenex which will be given. No need for IV steroids at present but continue higher dose of prednisone 60 mg at present 4. Hypertension-hold off on myocardis daily.   5.  right breast cancer S/P Arimidex therapy follows with [facility]. Clinically stable   Keep in SDU for now  Code Status:  Presumed full Family Communication: no family + Disposition Plan: likely home DVT prophylaxis: on IV heparin Consultants: Cardiology  Verneita Griffes, MD  [facility] Pager [phone] 03/22/2015, 7:35 AM    LOS: 1 day

## 2015-03-22 NOTE — Progress Notes (Signed)
Patient request for Tylenol for headache, order obtained. While waiting for pharmacy approval patient was assisted to the bathroom and a Tylenol was found in the patients bed in a tissue. Pt had not received any Tylenol from hospital staff prior to finding this medication in the tissue.

## 2015-03-22 NOTE — Progress Notes (Signed)
[place] for heparin Indication: atrial fibrillation  Allergies  Allergen Reactions  . Codeine Other (See Comments)    "jittery"    Patient Measurements: Height: 5\' 3"  (160 cm) Weight: 191 lb 5.8 oz (86.8 kg) IBW/kg (Calculated) : 52.4 Heparin Dosing Weight: 71.3  Vital Signs: Temp: 96.8 F (36 C) (01/08 2300) Temp Source: Oral (01/08 2300) BP: 98/66 mmHg (01/09 0600) Pulse Rate: 119 (01/09 0813)  Labs:  Recent Labs  03/21/15 1745 03/21/15 2135 03/21/15 2340 03/22/15 0443  HGB 13.6  --   --  11.6*  HCT 43.3  --   --  37.7  PLT 267  --   --  239  HEPARINUNFRC  --   --   --  0.25*  CREATININE 0.79  --   --   --   TROPONINI <0.03 <0.03 <0.03 <0.03    Estimated Creatinine Clearance: 68.4 mL/min (by C-G formula based on Cr of 0.79).   Medical History: Past Medical History  Diagnosis Date  . Hypertension   . Asthma   . History of breast cancer     right breast  . COPD (chronic obstructive pulmonary disease) ([facility])     Medications:  See medication history  Assessment: 70 yo lady to start heparin for afib. Heparin level this am is 0.25 units/ml.  No bleeding noted Goal of Therapy:  Heparin level 0.3-0.7 units/ml Monitor platelets by anticoagulation protocol: Yes   Plan:  Increase heparin drip to 1200 units/hr Check heparin level ~ 6 hours after rate change Monitor for bleeding complications  Thanks for allowing pharmacy to be a part of this patient's care.  Excell Seltzer, PharmD Clinical Pharmacist  03/22/2015,8:21 AM

## 2015-03-22 NOTE — Consult Note (Signed)
CARDIOLOGY CONSULT NOTE   Patient ID: [name] Rami MRN: [id number] DOB/AGE: 04-05-1945 70 y.o.  Admit Date: 03/21/2015 Referring Physician: Meredith Mody MD Primary Physician: Cleda Mccreedy, MD Consulting Cardiologist: Kate Sable MD Primary Cardiologist: New Reason for Consultation: New Onset Atrial flutter  Clinical Summary Ms. Amis is a 70 y.o.female with no cardiac history, but known history of O2 dependent COPD, hypertension, right breast CA with mastectomy, presented to ER with complaints of worsening dyspnea.  She states that she began to have coughing and congestion about 2 weeks ago. Saw her PCP and was placed on a Z-Pack as she was coughing up green sputum. She felt better after a few days, but awoke Sat night with dyspnea. Came to ER yesterday when it did not improve with inhalers.   On arrival to ER, EKG demonstrated atrial flutter with rate of 160 bpm. BP 128/77 O2 Sat 94%/ Lactic acid 2.74. WBC 11.4, BNP 346. CT scan demonstrating right hear failure, with advanced emphysema. Treated with albuterol, metoprolol 5 mg, adenosine, and diltiazem. Started on diltiazem gtt, given one dose of digoxin.. Started on heparin gtt. Troponin negative X 3. Magnesium 1.8.      Currently feeling some better, on diltiazem gtt at 5 mg hour. Remains in atrial flutter at 96 bpm. BP 105./60.   Allergies  Allergen Reactions  . Codeine Other (See Comments)    "jittery"    Medications Scheduled Medications: . sodium chloride   Intravenous STAT  . aspirin EC  81 mg Oral Daily  . cholecalciferol  400 Units Oral Daily  . levalbuterol  0.63 mg Nebulization Q6H  . magnesium oxide  400 mg Oral Daily  . pantoprazole  40 mg Oral Q0600  . [START ON 03/23/2015] pneumococcal 23 valent vaccine  0.5 mL Intramuscular Tomorrow-1000  . predniSONE  60 mg Oral QAC breakfast  . sodium chloride  3 mL Intravenous Q12H  . [START ON 04/08/2015] Vitamin D (Ergocalciferol)  50,000 Units Oral Q30 days     Infusions: . dextrose 5 % and 0.9% NaCl 75 mL/hr at 03/22/15 0100  . diltiazem (CARDIZEM) infusion 5 mg/hr (03/22/15 0400)  . heparin 1,200 Units/hr (03/22/15 0800)    PRN Medications: acetaminophen, bisacodyl, budesonide-formoterol, LORazepam, ondansetron **OR** ondansetron (ZOFRAN) IV   Past Medical History  Diagnosis Date  . Hypertension   . Asthma   . History of breast cancer     right breast  . COPD (chronic obstructive pulmonary disease) ([place])     Past Surgical History  Procedure Laterality Date  . Mastectomy  2011    right breast  . Cesarean section    . Hernia repair      RIH    Family History  Problem Relation Age of Onset  . Cancer Mother     lung  . Diabetes Brother     Social History Ms. Tom reports that she quit smoking about 21 years ago. She does not have any smokeless tobacco history on file. Ms. Schmied reports that she does not drink alcohol.  Review of Systems Complete review of systems are found to be negative unless outlined in H&P above.  Physical Examination Blood pressure 98/66, pulse 119, temperature 96.8 F (36 C), temperature source Oral, resp. rate 22, height 5\' 3"  (1.6 m), weight 191 lb 5.8 oz (86.8 kg), SpO2 96 %.  Intake/Output Summary (Last 24 hours) at 03/22/15 0954 Last data filed at 03/22/15 0400  Gross per 24 hour  Intake 560.53 ml  Output  0 ml  Net 560.53 ml    Telemetry:Atrial flutter with rates in the 90'- to low 100's.   GEN: Dyspneic with speaking.  HEENT: Conjunctiva and lids normal, oropharynx clear with moist mucosa. Neck: Supple, no elevated JVP or carotid bruits, no thyromegaly. Lungs: Significantly diminished breath sounds in the bases, very tight, with wheezes.  Cardiac: Regular rate and rhythm, distant heart sounds,no S3 or significant systolic murmur, no pericardial rub. Abdomen: Soft, nontender, no hepatomegaly, bowel sounds present, no guarding or rebound. Extremities: Non pitting edema,  distal pulses 2+. Skin: Warm and dry. Musculoskeletal: No kyphosis. Neuropsychiatric: Alert and oriented x3, affect grossly appropriate.  Prior Cardiac Testing/Procedures  Lab Results  Basic Metabolic Panel:  Recent Labs Lab 03/21/15 1745 03/21/15 2340  NA 141  --   K 4.6  --   CL 100*  --   CO2 33*  --   GLUCOSE 128*  --   BUN 13  --   CREATININE 0.79  --   CALCIUM 10.0  --   MG  --  1.8    Liver Function Tests:  Recent Labs Lab 03/21/15 1745  AST 32  ALT 34  ALKPHOS 75  BILITOT 0.8  PROT 7.9  ALBUMIN 4.7    CBC:  Recent Labs Lab 03/21/15 1745 03/22/15 0443  WBC 11.4* 11.1*  NEUTROABS 9.2*  --   HGB 13.6 11.6*  HCT 43.3 37.7  MCV 96.9 96.9  PLT 267 239    Cardiac Enzymes:  Recent Labs Lab 03/21/15 1745 03/21/15 2135 03/21/15 2340 03/22/15 0443  TROPONINI <0.03 <0.03 <0.03 <0.03    Radiology: Dg Chest 2 View  03/21/2015  CLINICAL DATA:  Shortness of breath.  Left upper quadrant pain. EXAM: CHEST  2 VIEW COMPARISON:  PA and lateral chest 12/20/2014. FINDINGS: The lungs are clear. Heart size is upper normal. No pneumothorax or pleural effusion. No focal bony abnormality. IMPRESSION: No acute disease. Electronically Signed   By: Inge Rise M.D.   On: 03/21/2015 18:57   Ct Angio Chest Pe W/cm &/or Wo Cm  03/21/2015  CLINICAL DATA:  Shortness of breath with lower extremity swelling for 3 days. EXAM: CT ANGIOGRAPHY CHEST WITH CONTRAST TECHNIQUE: Multidetector CT imaging of the chest was performed using the standard protocol during bolus administration of intravenous contrast. Multiplanar CT image reconstructions and MIPs were obtained to evaluate the vascular anatomy. CONTRAST:  127mL OMNIPAQUE IOHEXOL 350 MG/ML SOLN COMPARISON:  Radiographs earlier this day. FINDINGS: There are no filling defects within the pulmonary arteries to suggest pulmonary embolus. Main pulmonary artery upper limits normal in caliber measuring 3.3 cm. Thoracic aorta normal in  caliber without dissection or aneurysm. Dilatation of the right heart with contrast refluxing into the hepatic veins and IVC. No pleural or pericardial effusion. No mediastinal or hilar adenopathy. Advanced apical predominant emphysema. Breathing motion artifact through the lower lungs partially obscures evaluation. There is lower lobe bronchial thickening. No confluent airspace disease to suggest pneumonia. No pulmonary mass. No evidence pulmonary nodule, with assessment limited by patient motion. Post right mastectomy. No axillary adenopathy. Esophagus and thyroid gland are normal. Evaluation of the upper abdomen demonstrates prominent liver size, partially included. Likely motion through the sternum, less likely sternal fracture. Otherwise no acute or suspicious osseous abnormality. Review of the MIP images confirms the above findings. IMPRESSION: 1. No pulmonary embolus. 2. Right heart distension with contrast refluxing into the hepatic veins and IVC, consistent with right heart failure. 3. Advanced upper lobe emphysema.  Lower  lobe bronchial thickening. Electronically Signed   By: Jeb Levering M.D.   On: 03/21/2015 23:20     ECG: Atrial flutter with rate of 160 bpm.    Impression and Recommendations  1. New Onset Atrial flutter: LIkely related to COPD exacerbation. CHADS VASC Score of 3. Creatinine of 0.79. Currently on diltiazem at 5 mg hour, digoxin loading last dose this am of 0.25 mg. Heparin. She continues to have dyspnea. With significant lung disease, would not place on amiodarone unless she does not convert pharmacologically with diltiazem Awaiting echo report for RV and LV function. Expect high pulmonary pressures. Will change over to po diltiazem 30 mg Q 6 hr and continue po digoxin this am. Candidate for anticoagulation. Will start Eliquis 5 mg BID. Will stop ASA as she will be on DOAC.  Consider DCCV after 3 weeks of anticoagulation.   2. Hypertension: BP is controlled on diltiazem  gtt. She is on ARB with HCTZ at home. Will hold off on this for now as she is mildly hypotensive.   3. COPD exacerbation: Agree with changing to Xopenex.  Signed: Phill Myron. Lawrence NP [facility]  03/22/2015, 9:54 AM Co-Sign MD  The patient was seen and examined, and I agree with the assessment and plan as documented above, with modifications as noted below. Pt admitted with COPD exacerbation and rapid atrial flutter. Has received IV digoxin (now po) and had been on diltiazem infusion. Has received one dose of oral dilitiazem 30 mg, HR currently 158 bpm. Wheezing considerably and breathing remains tight. Now receiving oral prednisone, had been on IV methylprednisolone. Spoke with nursing staff. Currently normotensive.  Will temporarily load with IV amiodarone and switch oral diltiazem back to infusion in an attempt to control HR. Agree with Eliquis for anticoagulation. Will not use amiodarone longterm due to significant underlying lung disease.  Would not plan for TEE/cardioversion unless she stabilizes from a pulmonary standpoint and she continues to have elevated HR's. In that case, would need to anesthesia assistance but she is not a good candidate for this at the present time. Would aim to treat pulmonary causative issues first.   Kate Sable, MD, [facility]  03/22/2015 5:19 PM

## 2015-03-23 DIAGNOSIS — Z7901 Long term (current) use of anticoagulants: Secondary | ICD-10-CM

## 2015-03-23 DIAGNOSIS — Z5181 Encounter for therapeutic drug level monitoring: Secondary | ICD-10-CM

## 2015-03-23 DIAGNOSIS — I10 Essential (primary) hypertension: Secondary | ICD-10-CM

## 2015-03-23 LAB — CBC WITH DIFFERENTIAL/PLATELET
Basophils Absolute: 0 10*3/uL (ref 0.0–0.1)
Basophils Relative: 0 %
EOS PCT: 0 %
Eosinophils Absolute: 0 10*3/uL (ref 0.0–0.7)
HCT: 37.9 % (ref 36.0–46.0)
Hemoglobin: 11.5 g/dL — ABNORMAL LOW (ref 12.0–15.0)
LYMPHS ABS: 1.2 10*3/uL (ref 0.7–4.0)
LYMPHS PCT: 8 %
MCH: 29.9 pg (ref 26.0–34.0)
MCHC: 30.3 g/dL (ref 30.0–36.0)
MCV: 98.7 fL (ref 78.0–100.0)
MONO ABS: 1.5 10*3/uL — AB (ref 0.1–1.0)
Monocytes Relative: 9 %
Neutro Abs: 12.8 10*3/uL — ABNORMAL HIGH (ref 1.7–7.7)
Neutrophils Relative %: 83 %
PLATELETS: 257 10*3/uL (ref 150–400)
RBC: 3.84 MIL/uL — ABNORMAL LOW (ref 3.87–5.11)
RDW: 14.7 % (ref 11.5–15.5)
WBC: 15.5 10*3/uL — ABNORMAL HIGH (ref 4.0–10.5)

## 2015-03-23 LAB — LIPID PANEL
Cholesterol: 204 mg/dL — ABNORMAL HIGH (ref 0–200)
HDL: 85 mg/dL (ref >40)
LDL Cholesterol: 102 mg/dL — ABNORMAL HIGH (ref 0–99)
Total CHOL/HDL Ratio: 2.4 RATIO
Triglycerides: 86 mg/dL (ref <150)
VLDL: 17 mg/dL (ref 0–40)

## 2015-03-23 LAB — COMPREHENSIVE METABOLIC PANEL
ALBUMIN: 4.1 g/dL (ref 3.5–5.0)
ALT: 57 U/L — ABNORMAL HIGH (ref 14–54)
AST: 38 U/L (ref 15–41)
Alkaline Phosphatase: 55 U/L (ref 38–126)
Anion gap: 10 (ref 5–15)
BILIRUBIN TOTAL: 0.4 mg/dL (ref 0.3–1.2)
BUN: 19 mg/dL (ref 6–20)
CO2: 32 mmol/L (ref 22–32)
Calcium: 9.1 mg/dL (ref 8.9–10.3)
Chloride: 100 mmol/L — ABNORMAL LOW (ref 101–111)
Creatinine, Ser: 0.68 mg/dL (ref 0.44–1.00)
GFR calc Af Amer: >60 mL/min (ref >60)
GFR calc non Af Amer: >60 mL/min (ref >60)
GLUCOSE: 132 mg/dL — AB (ref 65–99)
POTASSIUM: 4.9 mmol/L (ref 3.5–5.1)
Sodium: 142 mmol/L (ref 135–145)
TOTAL PROTEIN: 6.8 g/dL (ref 6.5–8.1)

## 2015-03-23 MED ORDER — LEVALBUTEROL HCL 0.63 MG/3ML IN NEBU
0.6300 mg | INHALATION_SOLUTION | Freq: Three times a day (TID) | RESPIRATORY_TRACT | Status: DC
  Administered 2015-03-23: 0.63 mg via RESPIRATORY_TRACT
  Filled 2015-03-23: qty 3

## 2015-03-23 MED ORDER — LEVALBUTEROL HCL 0.63 MG/3ML IN NEBU
0.6300 mg | INHALATION_SOLUTION | Freq: Four times a day (QID) | RESPIRATORY_TRACT | Status: DC
  Administered 2015-03-23 – 2015-03-30 (×27): 0.63 mg via RESPIRATORY_TRACT
  Filled 2015-03-23 (×26): qty 3

## 2015-03-23 MED ORDER — BUDESONIDE-FORMOTEROL FUMARATE 160-4.5 MCG/ACT IN AERO
2.0000 | INHALATION_SPRAY | Freq: Two times a day (BID) | RESPIRATORY_TRACT | Status: DC
  Administered 2015-03-23 – 2015-03-30 (×13): 2 via RESPIRATORY_TRACT
  Filled 2015-03-23: qty 6

## 2015-03-23 MED ORDER — IPRATROPIUM BROMIDE 0.02 % IN SOLN
0.5000 mg | Freq: Two times a day (BID) | RESPIRATORY_TRACT | Status: DC
Start: 2015-03-23 — End: 2015-03-24
  Administered 2015-03-23 – 2015-03-24 (×4): 0.5 mg via RESPIRATORY_TRACT
  Filled 2015-03-23 (×4): qty 2.5

## 2015-03-23 NOTE — Plan of Care (Signed)
Problem: Acute Rehab PT Goals(only PT should resolve) Goal: Pt Will Transfer Bed To Chair/Chair To Bed Pt will transfer 5x sit to/from-stand with RW at ModI without loss-of-balance and HR <100bpm to demonstrate good safety awareness for independent mobility in home.     Goal: Pt Will Ambulate Pt will ambulate with RW at Supervision on RA for distances greater than 251ft and SaO2>88% to demonstrate the ability to perform safe household distance ambulation at discharge.    Goal: Pt Will Go Up/Down Stairs Pt will ascend/descend 4 stairs with LRAD and 1 HR at Supervision to demonstrate safe entry/exit of home.

## 2015-03-23 NOTE — Progress Notes (Signed)
Patient up to chair at this time per her request. Heart rate to 130, O2 sat 88% with exertion. Shortness of breath noted. Heart rate returned to 117 and O2 sat to  92% with rest.

## 2015-03-23 NOTE — Progress Notes (Signed)
Consulting cardiologist: Kate Sable MD Primary Cardiologist: New  Cardiology Specific Problem List: 1.New Onset Atrial fib/flutter 2. Hypertension  Subjective:    Continues to struggle with breathing. HR has been better controlled until she moves about or goes to bathroom.  Objective:   Temp:  [97 F (36.1 C)-98 F (36.7 C)] 97.3 F (36.3 C) (01/10 0400) Pulse Rate:  [33-159] 52 (01/10 0800) Resp:  [17-32] 18 (01/10 0800) BP: (97-155)/(61-143) 141/86 mmHg (01/10 0800) SpO2:  [82 %-100 %] 97 % (01/10 0800) Weight:  [194 lb 7.1 oz (88.2 kg)] 194 lb 7.1 oz (88.2 kg) (01/10 0500) Last BM Date: 03/21/15  Filed Weights   03/21/15 1721 03/21/15 2300 03/23/15 0500  Weight: 187 lb (84.823 kg) 191 lb 5.8 oz (86.8 kg) 194 lb 7.1 oz (88.2 kg)    Intake/Output Summary (Last 24 hours) at 03/23/15 0823 Last data filed at 03/23/15 0100  Gross per 24 hour  Intake 504.25 ml  Output    700 ml  Net -195.75 ml    Telemetry: Atrial fib rates between 115-150 (with minimal exertion)  Exam:  General: No acute distress. Dyspneic  HEENT: Conjunctiva and lids normal, oropharynx clear.  Lungs: Significantly diminished in the bases with tight wheezing in the upper lobes.   Cardiac: No elevated JVP or bruits. IRRR, tachycardic, no gallop or rub.   Abdomen: Normoactive bowel sounds, nontender, nondistended.  Extremities: Non pitting edema, distal pulses full.  Neuropsychiatric: Alert and oriented x3, affect appropriate.   Lab Results:  Basic Metabolic Panel:  Recent Labs Lab 03/21/15 1745 03/21/15 2340 03/23/15 0420  NA 141  --  142  K 4.6  --  4.9  CL 100*  --  100*  CO2 33*  --  32  GLUCOSE 128*  --  132*  BUN 13  --  19  CREATININE 0.79  --  0.68  CALCIUM 10.0  --  9.1  MG  --  1.8  --     Liver Function Tests:  Recent Labs Lab 03/21/15 1745 03/23/15 0420  AST 32 38  ALT 34 57*  ALKPHOS 75 55  BILITOT 0.8 0.4  PROT 7.9 6.8  ALBUMIN 4.7 4.1     CBC:  Recent Labs Lab 03/21/15 1745 03/22/15 0443 03/23/15 0420  WBC 11.4* 11.1* 15.5*  HGB 13.6 11.6* 11.5*  HCT 43.3 37.7 37.9  MCV 96.9 96.9 98.7  PLT 267 239 257    Cardiac Enzymes:  Recent Labs Lab 03/21/15 2340 03/22/15 0443 03/22/15 1033  TROPONINI <0.03 <0.03 <0.03    Radiology: Dg Chest 2 View  03/21/2015  CLINICAL DATA:  Shortness of breath.  Left upper quadrant pain. EXAM: CHEST  2 VIEW COMPARISON:  PA and lateral chest 12/20/2014. FINDINGS: The lungs are clear. Heart size is upper normal. No pneumothorax or pleural effusion. No focal bony abnormality. IMPRESSION: No acute disease. Electronically Signed   By: Inge Rise M.D.   On: 03/21/2015 18:57   Ct Angio Chest Pe W/cm &/or Wo Cm  03/21/2015  CLINICAL DATA:  Shortness of breath with lower extremity swelling for 3 days. EXAM: CT ANGIOGRAPHY CHEST WITH CONTRAST TECHNIQUE: Multidetector CT imaging of the chest was performed using the standard protocol during bolus administration of intravenous contrast. Multiplanar CT image reconstructions and MIPs were obtained to evaluate the vascular anatomy. CONTRAST:  141mL OMNIPAQUE IOHEXOL 350 MG/ML SOLN COMPARISON:  Radiographs earlier this day. FINDINGS: There are no filling defects within the pulmonary arteries to suggest pulmonary  embolus. Main pulmonary artery upper limits normal in caliber measuring 3.3 cm. Thoracic aorta normal in caliber without dissection or aneurysm. Dilatation of the right heart with contrast refluxing into the hepatic veins and IVC. No pleural or pericardial effusion. No mediastinal or hilar adenopathy. Advanced apical predominant emphysema. Breathing motion artifact through the lower lungs partially obscures evaluation. There is lower lobe bronchial thickening. No confluent airspace disease to suggest pneumonia. No pulmonary mass. No evidence pulmonary nodule, with assessment limited by patient motion. Post right mastectomy. No axillary  adenopathy. Esophagus and thyroid gland are normal. Evaluation of the upper abdomen demonstrates prominent liver size, partially included. Likely motion through the sternum, less likely sternal fracture. Otherwise no acute or suspicious osseous abnormality. Review of the MIP images confirms the above findings. IMPRESSION: 1. No pulmonary embolus. 2. Right heart distension with contrast refluxing into the hepatic veins and IVC, consistent with right heart failure. 3. Advanced upper lobe emphysema.  Lower lobe bronchial thickening. Electronically Signed   By: Jeb Levering M.D.   On: 03/21/2015 23:20   Echocardiogram 03/22/2015 Left ventricle: The cavity size was normal. Wall thickness was normal. Systolic function was normal. The estimated ejection fraction was in the range of 60% to 65%. Wall motion was normal; there were no regional wall motion abnormalities. The study was not technically sufficient to allow evaluation of LV diastolic dysfunction due to atrial fibrillation. - Mitral valve: There was mild regurgitation. - Tricuspid valve: There was mild-moderate regurgitation. - Pulmonary arteries: Systolic pressure was mildly to moderately increased. PA peak pressure: 45 mm Hg (S). - Inferior vena cava: The vessel was dilated. The respirophasic diameter changes were blunted (< 50%), consistent with elevated central venous pressure.   Medications:   Scheduled Medications: . apixaban  5 mg Oral BID  . cholecalciferol  400 Units Oral Daily  . digoxin  0.125 mg Oral Daily  . levalbuterol  0.63 mg Nebulization TID  . magnesium oxide  400 mg Oral Daily  . pantoprazole  40 mg Oral Q0600  . pneumococcal 23 valent vaccine  0.5 mL Intramuscular Tomorrow-1000  . predniSONE  60 mg Oral QAC breakfast  . sodium chloride  3 mL Intravenous Q12H  . [START ON 04/08/2015] Vitamin D (Ergocalciferol)  50,000 Units Oral Q30 days    Infusions: . amiodarone 30 mg/hr (03/23/15 0413)  .  diltiazem (CARDIZEM) infusion 10 mg/hr (03/23/15 0226)    PRN Medications: acetaminophen, bisacodyl, budesonide-formoterol, LORazepam, ondansetron **OR** ondansetron (ZOFRAN) IV   Assessment and Plan:   1.Atrial fib/flutter: Heart rates is still tenuous with elevations during minimal exertion. She is now on amiodarone load, and back on IV diltiazem gtt. HR at rest 115 bpm. She is tolerating Eliquis without bleeding issues or bruising currently.   2. Hypertension: BP elevates during minimal exertion. Will not start back ARB. Wt is up since admission. Mild non-pitting edema. May be related to steroids. Can consider low dose HCTZ if edema becomes issue.   3. COPD: Recommend pulmonary consult for assistance in management.   Phill Myron. Lawrence NP [place]  03/23/2015, 8:23 AM   The patient was seen and examined, and I agree with the assessment and plan as documented above, with modifications as noted below. Feels "about the same as yesterday". Ringing in ears. Has converted to sinus rhythm in past hour. This occurred after IV diltiazem increased to 15 mg/hr and oral digoxin given. Remains on IV amiodarone. Would continue current therapies for next 24 hours. Would then consider switching to  both oral diltiazem and amiodarone on 1/11.  Agree with Eliquis for anticoagulation. Will not use amiodarone longterm due to significant underlying lung disease.  12-lead ECG pending.  Kate Sable, MD, [facility]  03/23/2015 11:10 AM

## 2015-03-23 NOTE — Evaluation (Signed)
Physical Therapy Evaluation Patient Details Name: Eileen [name] MRN: [id number] DOB: 1946/02/15 Today's Date: 03/23/2015   History of Present Illness  70yo black female who comes in to [facility] on 1/8 p 1 week of worsening SOB. Pt found to have new onset atrial flutter c RVR. Pt is at 2L/min at baseline at home only at night only. On 1/9 HR was in 150's mobility in room and significant worsening SOB. At baseline pt is a limited community ambulator, indep in ADL, still driving, and  requires assistace with groceries and some housework.   Clinical Impression  Pt is received seated in chair upon entry (visitors in room), awake, alert, and willing to participate. No acute distress noted while at rest. Pt is a little confused with some questions in history taking and family adds clarifying points. Pt reports zero falls in the last 6 months, however during this evaluation sustains multiple moderate LOB, heightened fear of falling, and self reported worse balance upon standing. Functional mobility at home on RA creates a mildly worse DOE with limited community distances (500-1072ft) that pt reports requires 2-3 minutes to recover, however at eval pt is unable to perform slow ambulation at bedside without drop in SaO2 to 88% on 2L. Pt is on 2L/min O2 at home, which she reports only having to wear at night. Patient presenting with impairment of strength (poor ability to recover LOB indep),balance, oxygen perfusion, and activity tolerance, limiting ability to perform ADL and mobility tasks at  baseline level of function. Patient will benefit from skilled intervention to address the above impairments and limitations, in order to restore to prior level of function, improve patient safety upon discharge, and to decrease falls risk. Recommending supervision for all mobility after DC, and DC to home with HHPT, once patient is deemed medically ready.      Follow Up Recommendations Home health PT    Equipment  Recommendations  Rolling walker with 5" wheels    Recommendations for Other Services       Precautions / Restrictions Precautions Precautions: None Precaution Comments: Very limited balance, multiple LOB during PT evaluation.  Restrictions Weight Bearing Restrictions: No      Mobility  Bed Mobility               General bed mobility comments: recieved up in chair.   Transfers Overall transfer level: Needs assistance Equipment used: None Transfers: Sit to/from Stand Sit to Stand: Supervision         General transfer comment: supervision for multiple lines/leads.   Ambulation/Gait Ambulation/Gait assistance: Mod assist Ambulation Distance (Feet): 16 Feet Assistive device: None   Gait velocity: three big LOB that required modA to correct.  Gait velocity interpretation: <1.8 ft/sec, indicative of risk for recurrent falls General Gait Details: Gait is very limited by balance deficits, pt tring to hold onto bed for stability.   Stairs            Wheelchair Mobility    Modified Rankin (Stroke Patients Only)       Balance Overall balance assessment: Needs assistance Sitting-balance support: No upper extremity supported;Feet supported Sitting balance-Leahy Scale: Good     Standing balance support: No upper extremity supported Standing balance-Leahy Scale: Good Standing balance comment: insidious LOB after standing for 60sec, LOB with forward reach, LOB with attempts SLS bilat.  Single Leg Stance - Right Leg: 0 (LOB immediately, c poor recovery.) Single Leg Stance - Left Leg: 0 (LOB immediately, c poor recovery.)  Pertinent Vitals/Pain Pain Assessment: No/denies pain    Home Living Family/patient expects to be discharged to:: Private residence Living Arrangements: Children Available Help at Discharge: Family Type of Home: House Home Access: Stairs to enter Entrance Stairs-Rails: Right Entrance Stairs-Number of  Steps: 2 Home Layout: One level Home Equipment: None      Prior Function Level of Independence: Needs assistance   Gait / Transfers Assistance Needed: limited community distance, limited mostly by DOE.   ADL's / Homemaking Assistance Needed: requires some assistance for home making; indep in ADL.         Hand Dominance   Dominant Hand: Right    Extremity/Trunk Assessment   Upper Extremity Assessment: Overall WFL for tasks assessed           Lower Extremity Assessment: Overall WFL for tasks assessed;Generalized weakness         Communication   Communication: No difficulties  Cognition Arousal/Alertness: Awake/alert Behavior During Therapy: WFL for tasks assessed/performed;Anxious;Flat affect Overall Cognitive Status: Within Functional Limits for tasks assessed                      General Comments      Exercises        Assessment/Plan    PT Assessment Patient needs continued PT services  PT Diagnosis Difficulty walking;Abnormality of gait;Generalized weakness   PT Problem List Decreased strength;Decreased activity tolerance;Decreased balance;Decreased mobility;Decreased coordination;Cardiopulmonary status limiting activity;Decreased knowledge of use of DME  PT Treatment Interventions DME instruction;Gait training;Stair training;Functional mobility training;Therapeutic activities;Therapeutic exercise;Balance training;Patient/family education   PT Goals (Current goals can be found in the Care Plan section) Acute Rehab PT Goals Patient Stated Goal: return to home, improve balance, decrease DOE PT Goal Formulation: With patient Time For Goal Achievement: 04/06/15 Potential to Achieve Goals: Fair    Frequency Min 3X/week   Barriers to discharge   Pt does not currently have, but is able to obtain supervision at home for all mobility.     Co-evaluation               End of Session Equipment Utilized During Treatment: Gait belt Activity  Tolerance: Patient tolerated treatment well;Patient limited by fatigue;Treatment limited secondary to medical complications (Comment) (limited by DOE and hypoxia. ) Patient left: in chair;with call bell/phone within reach;with family/visitor present Nurse Communication: Mobility status;Need for lift equipment;Precautions         Time: [id number] PT Time Calculation (min) (ACUTE ONLY): 14 min   Charges:   PT Evaluation $PT Eval Moderate Complexity: 1 Procedure PT Treatments $Therapeutic Activity: 8-22 mins   PT G Codes:        Kemia Wendel C Apr 15, 2015, 12:49 PM  12:54 PM  Etta Grandchild, PT, DPT [employer] License # [id number]

## 2015-03-23 NOTE — Progress Notes (Addendum)
Patient sitting up in chair complaining of feeling dizzy headed due to a ringing in her ears. Vital signs stable. Patient declined going back to bed at this time. Dr. Verlon Au notified. Orthostatic vital signs done and stable.

## 2015-03-23 NOTE — Progress Notes (Signed)
Eileen [name] CE:[id number] DOB: [birth year] DOA: 03/21/2015 PCP: Cleda Mccreedy, MD  Brief narrative: 70 y/o ? COPD + Emphysema severe [hospitalized last 12/2014 for acute exacerbation]-apparently on chr steroids DCIS R breast s/p Mastectomy 10/2009 s/p 5 yrs  Arimidex8/206 osteoprosis previously on Prolia Htn  Admitted to [facility] SDU 03/21/15 with Severe COPd exacerbation and new onset Aflutter with prolonged QTc 580  Cardiology consulted   Past medical history-As per Problem list Chart reviewed as below-   Consultants:  Cardiology  Procedures:    Antibiotics:  nad   Subjective   Doing fair-feels a little worse than yesterday Some HA Ringing in ears  No n/v Converted to NSR No CP    Objective    Interim History: . amiodarone 30 mg/hr (03/23/15 0413)  . diltiazem (CARDIZEM) infusion 15 mg/hr (03/23/15 0951)     Telemetry: NSR 100 range  Objective: Filed Vitals:   03/23/15 1011 03/23/15 1015 03/23/15 1030 03/23/15 1045  BP: 127/72 153/80 122/88 135/79  Pulse: 106 109 109 99  Temp:      TempSrc:      Resp: 33   23  Height:      Weight:      SpO2: 100% 100% 100% 94%    Intake/Output Summary (Last 24 hours) at 03/23/15 1100 Last data filed at 03/23/15 0900  Gross per 24 hour  Intake 453.25 ml  Output    950 ml  Net -496.75 ml    Exam:  General: EOMI NCAT pleasant alert oriented Cardiovascular: S1-S2 no murmur rub or gallop tachycardic Respiratory: Clinically clear no added sound no wheeze no rhonchi Abdomen: Soft nontender nondistended no rebound or guarding Skin no lower extremity edema Neuro grossly intact  Data Reviewed: Basic Metabolic Panel:  Recent Labs Lab 03/21/15 1745 03/21/15 2340 03/23/15 0420  NA 141  --  142  K 4.6  --  4.9  CL 100*  --  100*  CO2 33*  --  32  GLUCOSE 128*  --  132*  BUN 13  --  19  CREATININE 0.79  --  0.68  CALCIUM 10.0  --  9.1  MG  --  1.8  --    Liver Function Tests:  Recent Labs Lab  03/21/15 1745 03/23/15 0420  AST 32 38  ALT 34 57*  ALKPHOS 75 55  BILITOT 0.8 0.4  PROT 7.9 6.8  ALBUMIN 4.7 4.1   No results for input(s): LIPASE, AMYLASE in the last 168 hours. No results for input(s): AMMONIA in the last 168 hours. CBC:  Recent Labs Lab 03/21/15 1745 03/22/15 0443 03/23/15 0420  WBC 11.4* 11.1* 15.5*  NEUTROABS 9.2*  --  12.8*  HGB 13.6 11.6* 11.5*  HCT 43.3 37.7 37.9  MCV 96.9 96.9 98.7  PLT 267 239 257   Cardiac Enzymes:  Recent Labs Lab 03/21/15 1745 03/21/15 2135 03/21/15 2340 03/22/15 0443 03/22/15 1033  TROPONINI <0.03 <0.03 <0.03 <0.03 <0.03   BNP: Invalid input(s): POCBNP CBG: No results for input(s): GLUCAP in the last 168 hours.  Recent Results (from the past 240 hour(s))  MRSA PCR Screening     Status: None   Collection Time: 03/22/15 12:00 PM  Result Value Ref Range Status   MRSA by PCR NEGATIVE NEGATIVE Final    Comment:        The GeneXpert MRSA Assay (FDA approved for NASAL specimens only), is one component of a comprehensive MRSA colonization surveillance program. It is not intended to diagnose MRSA infection  nor to guide or monitor treatment for MRSA infections.      Studies:              All Imaging reviewed and is as per above notation   Scheduled Meds: . apixaban  5 mg Oral BID  . cholecalciferol  400 Units Oral Daily  . digoxin  0.125 mg Oral Daily  . levalbuterol  0.63 mg Nebulization TID  . magnesium oxide  400 mg Oral Daily  . pantoprazole  40 mg Oral Q0600  . pneumococcal 23 valent vaccine  0.5 mL Intramuscular Tomorrow-1000  . predniSONE  60 mg Oral QAC breakfast  . sodium chloride  3 mL Intravenous Q12H  . [START ON 04/08/2015] Vitamin D (Ergocalciferol)  50,000 Units Oral Q30 days   Continuous Infusions: . amiodarone 30 mg/hr (03/23/15 0413)  . diltiazem (CARDIZEM) infusion 15 mg/hr (03/23/15 0951)     Assessment/Plan:  1. Atrial flutter Chad2Vasc2 score-3.  Currently on Cardizem 15 mg  per hour, loadin with IV Amiodarone gtt,  digoxin loaded 0.25 every 6..? Flutter ablation vs Propafenone for chemical cardioversion? Heparin-->apixaban 5 mg bidper cardiology. Note that the patient's TSH is 0.49, keep magnesium >2 and replace orally 2. Prolonged Qtc-monitor on tele. 3. Severe COPD/emphysema-pulmonary hypertension and right atrial dilatation may cause atrial flutter. See above discussion. No wheeze currently so changed to Xopenex which will be given. No need for IV steroids at present but continue higher dose of prednisone 60 mg at present.  Continue chronic home o2-aim for sat 88-90% 4. Hypertension-hold off on myocardis daily.   5.  right breast cancer S/P Arimidex therapy follows with [facility]. Clinically stable   Keep in SDU for now till we see a durable decrease in HR On MAx Cardizem gtt right now  Disposition Plan: likely home when stable Therapy eval ordered for 03/24/15 DVT prophylaxis: apixaban Consultants: Cardiology  Verneita Griffes, MD  [facility] Pager [phone] 03/23/2015, 11:00 AM    LOS: 2 days

## 2015-03-24 ENCOUNTER — Inpatient Hospital Stay (HOSPITAL_COMMUNITY): Payer: Medicare Other

## 2015-03-24 DIAGNOSIS — J9622 Acute and chronic respiratory failure with hypercapnia: Secondary | ICD-10-CM

## 2015-03-24 DIAGNOSIS — D0510 Intraductal carcinoma in situ of unspecified breast: Secondary | ICD-10-CM

## 2015-03-24 DIAGNOSIS — J9620 Acute and chronic respiratory failure, unspecified whether with hypoxia or hypercapnia: Secondary | ICD-10-CM | POA: Diagnosis present

## 2015-03-24 DIAGNOSIS — J189 Pneumonia, unspecified organism: Secondary | ICD-10-CM

## 2015-03-24 DIAGNOSIS — J9602 Acute respiratory failure with hypercapnia: Secondary | ICD-10-CM

## 2015-03-24 DIAGNOSIS — G9341 Metabolic encephalopathy: Secondary | ICD-10-CM

## 2015-03-24 LAB — BASIC METABOLIC PANEL
Anion gap: 9 (ref 5–15)
BUN: 15 mg/dL (ref 6–20)
CO2: 38 mmol/L — ABNORMAL HIGH (ref 22–32)
CREATININE: 0.56 mg/dL (ref 0.44–1.00)
Calcium: 9.5 mg/dL (ref 8.9–10.3)
Chloride: 94 mmol/L — ABNORMAL LOW (ref 101–111)
GFR calc Af Amer: >60 mL/min (ref >60)
GLUCOSE: 159 mg/dL — AB (ref 65–99)
Potassium: 4.5 mmol/L (ref 3.5–5.1)
SODIUM: 141 mmol/L (ref 135–145)

## 2015-03-24 LAB — BLOOD GAS, ARTERIAL
ACID-BASE EXCESS: 13.4 mmol/L — AB (ref 0.0–2.0)
ACID-BASE EXCESS: 14.6 mmol/L — AB (ref 0.0–2.0)
ACID-BASE EXCESS: 15 mmol/L — AB (ref 0.0–2.0)
BICARBONATE: 34.2 meq/L — AB (ref 20.0–24.0)
Bicarbonate: 36 mEq/L — ABNORMAL HIGH (ref 20.0–24.0)
Bicarbonate: 37 mEq/L — ABNORMAL HIGH (ref 20.0–24.0)
DELIVERY SYSTEMS: POSITIVE
DRAWN BY: 25788
Delivery systems: POSITIVE
EXPIRATORY PAP: 6
Expiratory PAP: 7
FIO2: 30
FIO2: 40
INSPIRATORY PAP: 16
Inspiratory PAP: 18
MODE: POSITIVE
O2 CONTENT: 2 L/min
O2 SAT: 88.4 %
O2 Saturation: 89.4 %
O2 Saturation: 91.2 %
PCO2 ART: 94.2 mmHg — AB (ref 35.0–45.0)
PCO2 ART: 80.6 mmHg — AB (ref 35.0–45.0)
PCO2 ART: 72.5 mmHg — AB (ref 35.0–45.0)
PH ART: 7.327 — AB (ref 7.350–7.450)
PH ART: 7.371 (ref 7.350–7.450)
PO2 ART: 64.3 mmHg — AB (ref 80.0–100.0)
pH, Arterial: 7.259 — ABNORMAL LOW (ref 7.350–7.450)
pO2, Arterial: 60.9 mmHg — ABNORMAL LOW (ref 80.0–100.0)
pO2, Arterial: 61.4 mmHg — ABNORMAL LOW (ref 80.0–100.0)

## 2015-03-24 LAB — MAGNESIUM: MAGNESIUM: 2.2 mg/dL (ref 1.7–2.4)

## 2015-03-24 LAB — STREP PNEUMONIAE URINARY ANTIGEN: Strep Pneumo Urinary Antigen: NEGATIVE

## 2015-03-24 MED ORDER — DEXTROSE 5 % IV SOLN
1.0000 g | Freq: Three times a day (TID) | INTRAVENOUS | Status: DC
  Administered 2015-03-24 – 2015-03-27 (×10): 1 g via INTRAVENOUS
  Filled 2015-03-24 (×17): qty 1

## 2015-03-24 MED ORDER — METHYLPREDNISOLONE SODIUM SUCC 125 MG IJ SOLR
80.0000 mg | Freq: Four times a day (QID) | INTRAMUSCULAR | Status: DC
  Administered 2015-03-24 – 2015-03-27 (×13): 80 mg via INTRAVENOUS
  Filled 2015-03-24 (×14): qty 2

## 2015-03-24 MED ORDER — VANCOMYCIN HCL 10 G IV SOLR
1500.0000 mg | Freq: Once | INTRAVENOUS | Status: AC
  Administered 2015-03-24: 1500 mg via INTRAVENOUS
  Filled 2015-03-24: qty 1500

## 2015-03-24 MED ORDER — VANCOMYCIN HCL IN DEXTROSE 1-5 GM/200ML-% IV SOLN
1000.0000 mg | Freq: Two times a day (BID) | INTRAVENOUS | Status: DC
  Administered 2015-03-24 – 2015-03-25 (×3): 1000 mg via INTRAVENOUS
  Filled 2015-03-24 (×2): qty 200

## 2015-03-24 MED ORDER — IPRATROPIUM BROMIDE 0.02 % IN SOLN
0.5000 mg | Freq: Two times a day (BID) | RESPIRATORY_TRACT | Status: DC
  Administered 2015-03-25 – 2015-03-30 (×11): 0.5 mg via RESPIRATORY_TRACT
  Filled 2015-03-24 (×12): qty 2.5

## 2015-03-24 NOTE — Progress Notes (Signed)
[facility] PROGRESS NOTE  Eileen [name] [id number] DOB: August 16, 1945 DOA: 03/21/2015 PCP: Cleda Mccreedy, MD  Assessment/Plan: 1. Acute on chronic respiratory failure with hypercapnia. On 3L O2 at baseline.  ABG 7.259/94.2/64.3. Currently requiring BIPAP, may require intubation if she does not improve. Will attempt to wean O2. Will recheck ABG once she has been on BIPAP. 2. COPD exacerbation. Slow to improve with oral steroids. Pulmonology input appreciated. Will transition to IV steroids per pulmonology. Will continue bronchodilators and abx. 3. HCAP, repeat CXR shows possible bilateral LL PNAs. Will start on IV abx. Afebrile. 4. New onset Atrial flutter.  Chad2Vasc2 score of 3. Improving with IV Amiodarone and Cardizem drip. Continue Eliquis per cardiology. 5. Prolonged Qtc.  EKG from this morning shows QTC in normal range. 6. Acute metabolic encephalopathy likely related to hypercapnia, appears to be slowly improving with BIPAP.  7. Hypertension, BP slightly elevated however has also had periods of hypotension. Continue to hold ARB per cardiology.  8. Right breast cancer S/P Arimidex therapy. Follows with [facility]. Clinically stable   Code Status: Full DVT prophylaxis: Eliquis Family Communication: Daughter at bedside. Disposition Plan: Continue to monitor in ICU.    Consultants:  PT- HH PT  Cardiology  Procedures:    Antibiotics:  Cefepime 1/11>>   HPI/Subjective: History limited secondary to patient being on BIPAP.   Objective: Filed Vitals:   03/24/15 0500 03/24/15 0600  BP: 139/76 134/58  Pulse: 108 108  Temp:    Resp: 25 29    Intake/Output Summary (Last 24 hours) at 03/24/15 0725 Last data filed at 03/24/15 0316  Gross per 24 hour  Intake    433 ml  Output   1350 ml  Net   -917 ml   Filed Weights   03/21/15 1721 03/21/15 2300 03/23/15 0500  Weight: 84.823 kg (187 lb) 86.8 kg (191 lb 5.8 oz) 88.2 kg (194 lb 7.1 oz)     Exam:  General: NAD, on BIPAP Cardiovascular: Irregular Respiratory: Diminished breath sounds with scattered wheezes and rhonchi. Abdomen: soft, non tender, no distention , bowel sounds normal Musculoskeletal: No edema b/l   Data Reviewed: Basic Metabolic Panel:  Recent Labs Lab 03/21/15 1745 03/21/15 2340 03/23/15 0420  NA 141  --  142  K 4.6  --  4.9  CL 100*  --  100*  CO2 33*  --  32  GLUCOSE 128*  --  132*  BUN 13  --  19  CREATININE 0.79  --  0.68  CALCIUM 10.0  --  9.1  MG  --  1.8  --    Liver Function Tests:  Recent Labs Lab 03/21/15 1745 03/23/15 0420  AST 32 38  ALT 34 57*  ALKPHOS 75 55  BILITOT 0.8 0.4  PROT 7.9 6.8  ALBUMIN 4.7 4.1    CBC:  Recent Labs Lab 03/21/15 1745 03/22/15 0443 03/23/15 0420  WBC 11.4* 11.1* 15.5*  NEUTROABS 9.2*  --  12.8*  HGB 13.6 11.6* 11.5*  HCT 43.3 37.7 37.9  MCV 96.9 96.9 98.7  PLT 267 239 257   Cardiac Enzymes:  Recent Labs Lab 03/21/15 1745 03/21/15 2135 03/21/15 2340 03/22/15 0443 03/22/15 1033  TROPONINI <0.03 <0.03 <0.03 <0.03 <0.03   BNP (last 3 results)  Recent Labs  03/21/15 1745  BNP 346.0*     Recent Results (from the past 240 hour(s))  MRSA PCR Screening     Status: None   Collection Time: 03/22/15 12:00 PM  Result Value  Ref Range Status   MRSA by PCR NEGATIVE NEGATIVE Final    Comment:        The GeneXpert MRSA Assay (FDA approved for NASAL specimens only), is one component of a comprehensive MRSA colonization surveillance program. It is not intended to diagnose MRSA infection nor to guide or monitor treatment for MRSA infections.       Scheduled Meds: . apixaban  5 mg Oral BID  . budesonide-formoterol  2 puff Inhalation BID  . cholecalciferol  400 Units Oral Daily  . digoxin  0.125 mg Oral Daily  . ipratropium  0.5 mg Nebulization BID  . levalbuterol  0.63 mg Nebulization Q6H  . magnesium oxide  400 mg Oral Daily  . pantoprazole  40 mg Oral Q0600  .  pneumococcal 23 valent vaccine  0.5 mL Intramuscular Tomorrow-1000  . predniSONE  60 mg Oral QAC breakfast  . sodium chloride  3 mL Intravenous Q12H  . [START ON 04/08/2015] Vitamin D (Ergocalciferol)  50,000 Units Oral Q30 days   Continuous Infusions: . amiodarone 30 mg/hr (03/24/15 0319)  . diltiazem (CARDIZEM) infusion 5 mg/hr (03/24/15 0157)    Principal Problem:   New onset atrial flutter (HCC) Active Problems:   DCIS (ductal carcinoma in situ) of breast, right, 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, Arimadex   COPD exacerbation ([place])   Obesity   Atrial flutter with rapid ventricular response [facility])   Essential hypertension   Anticoagulation management encounter    Time spent: 25 minutes   Darrien Laakso. MD  [facility] Pager [phone]. If 7PM-7AM, please contact night-coverage at [email or web address], password [email or web address] 03/24/2015, 7:25 AM  LOS: 3 days       By signing my name below, I, Rosalie Doctor, attest that this documentation has been prepared under the direction and in the presence of [facility]. MD Electronically Signed: Rosalie Doctor, Scribe. 03/24/2015 9:01am  I, Dr. Kathie Dike, personally performed the services described in this documentaiton. All medical record entries made by the scribe were at my direction and in my presence. I have reviewed the chart and agree that the record reflects my personal performance and is accurate and complete  Kathie Dike, MD, 03/24/2015 9:24 AM

## 2015-03-24 NOTE — Progress Notes (Signed)
Called into room by daughter for assistance with ambulation back from bathroom. Patient was sluggish to answer but could answer appropriately. RR approximately 40 breathes per minute. Heart rate max of 120. Called Dr. Luan Pulling into room to assess. Orders given for stat ABG and CXR.

## 2015-03-24 NOTE — Progress Notes (Signed)
ANTIBIOTIC CONSULT NOTE - INITIAL  Pharmacy Consult for Vancomycin Indication: pneumonia  Allergies  Allergen Reactions  . Codeine Other (See Comments)    "jittery"    Patient Measurements: Height: 5\' 3"  (160 cm) Weight: 193 lb 12.6 oz (87.9 kg) IBW/kg (Calculated) : 52.4  Vital Signs: Temp: 97.5 F (36.4 C) (01/11 0400) Temp Source: Oral (01/11 0400) BP: 131/49 mmHg (01/11 0800) Pulse Rate: 100 (01/11 0943) Intake/Output from previous day: 01/10 0701 - 01/11 0700 In: 433 [I.V.:433] Out: 1550 [Urine:1550] Intake/Output from this shift:    Labs:  Recent Labs  03/21/15 1745 03/22/15 0443 03/23/15 0420 03/24/15 0721  WBC 11.4* 11.1* 15.5*  --   HGB 13.6 11.6* 11.5*  --   PLT 267 239 257  --   CREATININE 0.79  --  0.68 0.56   Estimated Creatinine Clearance: 68.8 mL/min (by C-G formula based on Cr of 0.56). No results for input(s): VANCOTROUGH, VANCOPEAK, VANCORANDOM, GENTTROUGH, GENTPEAK, GENTRANDOM, TOBRATROUGH, TOBRAPEAK, TOBRARND, AMIKACINPEAK, AMIKACINTROU, AMIKACIN in the last 72 hours.   Microbiology: Recent Results (from the past 720 hour(s))  MRSA PCR Screening     Status: None   Collection Time: 03/22/15 12:00 PM  Result Value Ref Range Status   MRSA by PCR NEGATIVE NEGATIVE Final    Comment:        The GeneXpert MRSA Assay (FDA approved for NASAL specimens only), is one component of a comprehensive MRSA colonization surveillance program. It is not intended to diagnose MRSA infection nor to guide or monitor treatment for MRSA infections.   Culture, blood (routine x 2) Call MD if unable to obtain prior to antibiotics being given     Status: None (Preliminary result)   Collection Time: 03/24/15  9:30 AM  Result Value Ref Range Status   Specimen Description BLOOD LEFT HAND  Final   Special Requests BOTTLES DRAWN AEROBIC ONLY 6 CC  Final   Culture PENDING  Incomplete   Report Status PENDING  Incomplete  Culture, blood (routine x 2) Call MD if  unable to obtain prior to antibiotics being given     Status: None (Preliminary result)   Collection Time: 03/24/15  9:45 AM  Result Value Ref Range Status   Specimen Description BLOOD RIGHT HAND  Final   Special Requests BOTTLES DRAWN AEROBIC AND ANAEROBIC 7 CC EACH  Final   Culture PENDING  Incomplete   Report Status PENDING  Incomplete    Medical History: Past Medical History  Diagnosis Date  . Hypertension   . Asthma   . History of breast cancer     right breast  . COPD (chronic obstructive pulmonary disease) (HCC)    Assessment: 70 year old who was admitted to the hospital with COPD exacerbation. She has been having increasing problems with her breathing overnight and this morning is poorly responsive and not moving air well. She has tachypnea. pH of about 7.25 with a PCO2 in the 90s. She is going to be started on BiPAP. Chest x-ray is pending. At baseline she has chronic hypoxic respiratory failure from her COPD. She had been having cough and congestion for the last 2 weeks. She had been coughing up green sputum.  Vancomycin initiated.  Goal of Therapy:  Vancomycin trough level 15-20 mcg/ml  Plan:  Vancomycin 1500mg  loading dose now x 1 then Vancomycin 1000mg  IV q12hrs Check trough at steady state Monitor labs, renal fxn, progress, and cultures  Hart Robinsons A 03/24/2015,11:34 AM

## 2015-03-24 NOTE — Consult Note (Signed)
Consult requested by: [facility] Consult requested for COPD/respiratory failure:  HPI: This is a 70 year old who was admitted to the hospital with COPD exacerbation and new onset atrial flutter. She has been having increasing problems with her breathing overnight and this morning is poorly responsive and not moving air well. She has tachypnea. I ordered a stat blood gas shows a pH of about 7.25 with a PCO2 in the 90s. She is going to be started on BiPAP. Chest x-ray is pending. At baseline she has chronic hypoxic respiratory failure from her COPD. She had been having cough and congestion for the last 2 weeks. She had been coughing up green sputum. When she came to the emergency department she was treated with nebulizer treatments etc. did not improve and was admitted.  Past Medical History  Diagnosis Date  . Hypertension   . Asthma   . History of breast cancer     right breast  . COPD (chronic obstructive pulmonary disease) (HCC)      Family History  Problem Relation Age of Onset  . Cancer Mother     lung  . Diabetes Brother      Social History   Social History  . Marital Status: Single    Spouse Name: N/A  . Number of Children: N/A  . Years of Education: N/A   Social History Main Topics  . Smoking status: Former Smoker    Quit date: 03/13/1994  . Smokeless tobacco: None  . Alcohol Use: No  . Drug Use: No  . Sexual Activity: Not Asked   Other Topics Concern  . None   Social History Narrative     ROS: Not obtainable    Objective: Vital signs in last 24 hours: Temp:  [96.6 F (35.9 C)-97.5 F (36.4 C)] 97.5 F (36.4 C) (01/11 0400) Pulse Rate:  [52-141] 108 (01/11 0600) Resp:  [18-41] 29 (01/11 0600) BP: (93-166)/(50-116) 134/58 mmHg (01/11 0600) SpO2:  [88 %-100 %] 98 % (01/11 0600) Weight:  [87.9 kg (193 lb 12.6 oz)] 87.9 kg (193 lb 12.6 oz) (01/11 0500) Weight change: -0.3 kg (-10.6 oz) Last BM Date: 03/21/15  Intake/Output from previous  day: 01/10 0701 - 01/11 0700 In: 433 [I.V.:433] Out: 1550 [Urine:1550]  PHYSICAL EXAM She is awake but poorly responsive. She is breathing about 40 times a minute. Her heart rate in the low 100s. It seems fairly regular now. I don't gallop. Her breath sounds are markedly diminished. Her abdomen is soft without masses. She has had a right mastectomy. Extremities show trace edema. Central nervous system examination shows that she is poorly responsive.  Lab Results: Basic Metabolic Panel:  Recent Labs  03/21/15 2340 03/23/15 0420 03/24/15 0721  NA  --  142 141  K  --  4.9 4.5  CL  --  100* 94*  CO2  --  32 38*  GLUCOSE  --  132* 159*  BUN  --  19 15  CREATININE  --  0.68 0.56  CALCIUM  --  9.1 9.5  MG 1.8  --  2.2   Liver Function Tests:  Recent Labs  03/21/15 1745 03/23/15 0420  AST 32 38  ALT 34 57*  ALKPHOS 75 55  BILITOT 0.8 0.4  PROT 7.9 6.8  ALBUMIN 4.7 4.1   No results for input(s): LIPASE, AMYLASE in the last 72 hours. No results for input(s): AMMONIA in the last 72 hours. CBC:  Recent Labs  03/21/15 1745 03/22/15 0443 03/23/15 0420  WBC  11.4* 11.1* 15.5*  NEUTROABS 9.2*  --  12.8*  HGB 13.6 11.6* 11.5*  HCT 43.3 37.7 37.9  MCV 96.9 96.9 98.7  PLT 267 239 257   Cardiac Enzymes:  Recent Labs  03/21/15 2340 03/22/15 0443 03/22/15 1033  TROPONINI <0.03 <0.03 <0.03   BNP: No results for input(s): PROBNP in the last 72 hours. D-Dimer: No results for input(s): DDIMER in the last 72 hours. CBG: No results for input(s): GLUCAP in the last 72 hours. Hemoglobin A1C: No results for input(s): HGBA1C in the last 72 hours. Fasting Lipid Panel:  Recent Labs  03/23/15 0420  CHOL 204*  HDL 85  LDLCALC 102*  TRIG 86  CHOLHDL 2.4   Thyroid Function Tests:  Recent Labs  03/21/15 2340  TSH 0.496   Anemia Panel: No results for input(s): VITAMINB12, FOLATE, FERRITIN, TIBC, IRON, RETICCTPCT in the last 72 hours. Coagulation: No results for  input(s): LABPROT, INR in the last 72 hours. Urine Drug Screen: Drugs of Abuse  No results found for: LABOPIA, COCAINSCRNUR, LABBENZ, AMPHETMU, THCU, LABBARB  Alcohol Level: No results for input(s): ETH in the last 72 hours. Urinalysis: No results for input(s): COLORURINE, LABSPEC, PHURINE, GLUCOSEU, HGBUR, BILIRUBINUR, KETONESUR, PROTEINUR, UROBILINOGEN, NITRITE, LEUKOCYTESUR in the last 72 hours.  Invalid input(s): APPERANCEUR Misc. Labs:   ABGS: No results for input(s): PHART, PO2ART, TCO2, HCO3 in the last 72 hours.  Invalid input(s): PCO2   MICROBIOLOGY: Recent Results (from the past 240 hour(s))  MRSA PCR Screening     Status: None   Collection Time: 03/22/15 12:00 PM  Result Value Ref Range Status   MRSA by PCR NEGATIVE NEGATIVE Final    Comment:        The GeneXpert MRSA Assay (FDA approved for NASAL specimens only), is one component of a comprehensive MRSA colonization surveillance program. It is not intended to diagnose MRSA infection nor to guide or monitor treatment for MRSA infections.     Studies/Results: No results found.  Medications:  Prior to Admission:  Prescriptions prior to admission  Medication Sig Dispense Refill Last Dose  . Ascorbic Acid (VITAMIN C PO) Take 1 tablet by mouth daily.   Past Week at Unknown time  . Cholecalciferol (VITAMIN D PO) Take 1 tablet by mouth daily.    Past Week at Unknown time  . Fluticasone-Salmeterol (ADVAIR) 250-50 MCG/DOSE AEPB Inhale 1 puff into the lungs 2 (two) times daily.   03/21/2015 at Unknown time  . ibuprofen (ADVIL,MOTRIN) 200 MG tablet Take 400 mg by mouth every 6 (six) hours as needed for moderate pain.   Past Week at Unknown time  . OXYGEN Inhale 2 L into the lungs daily.   03/21/2015 at Unknown time  . predniSONE (DELTASONE) 5 MG tablet Take 10 mg by mouth every other day.    03/21/2015 at Unknown time  . PROAIR HFA 108 (90 BASE) MCG/ACT inhaler Inhale 2 puffs into the lungs every 6 (six) hours as needed  for wheezing or shortness of breath.    03/21/2015 at Unknown time  . SPIRIVA HANDIHALER 18 MCG inhalation capsule Place 18 mcg into inhaler and inhale daily as needed (for shortness of breath).    03/21/2015 at Unknown time  . SYMBICORT 160-4.5 MCG/ACT inhaler Inhale 2 puffs into the lungs daily as needed (for shortness of breath).    Past Month at Unknown time  . telmisartan-hydrochlorothiazide (MICARDIS HCT) 80-12.5 MG per tablet Take 1 tablet by mouth daily.     03/21/2015 at Unknown  time  . Vitamin D, Ergocalciferol, (DRISDOL) 50000 UNITS CAPS capsule Take 50,000 Units by mouth every 30 (thirty) days.   Past Month at Unknown time  . albuterol (PROVENTIL) (2.5 MG/3ML) 0.083% nebulizer solution Take 2.5 mg by nebulization every 6 (six) hours as needed for wheezing or shortness of breath.   03/21/2015 at Unknown time  . levofloxacin (LEVAQUIN) 750 MG tablet Take 1 tablet (750 mg total) by mouth daily. (Patient not taking: Reported on 03/21/2015) 5 tablet 0 Unknown at Unknown time  . predniSONE (DELTASONE) 10 MG tablet Take 1 tablet (10 mg total) by mouth daily with breakfast. Take 6 tablets today and then decrease by 1 tablet daily until none are left. (Patient not taking: Reported on 03/21/2015) 21 tablet 0 Unknown at Unknown time   Scheduled: . apixaban  5 mg Oral BID  . budesonide-formoterol  2 puff Inhalation BID  . cholecalciferol  400 Units Oral Daily  . digoxin  0.125 mg Oral Daily  . ipratropium  0.5 mg Nebulization BID  . levalbuterol  0.63 mg Nebulization Q6H  . magnesium oxide  400 mg Oral Daily  . methylPREDNISolone (SOLU-MEDROL) injection  80 mg Intravenous Q6H  . pantoprazole  40 mg Oral Q0600  . pneumococcal 23 valent vaccine  0.5 mL Intramuscular Tomorrow-1000  . sodium chloride  3 mL Intravenous Q12H  . [START ON 04/08/2015] Vitamin D (Ergocalciferol)  50,000 Units Oral Q30 days   Continuous: . amiodarone 30 mg/hr (03/24/15 0319)  . diltiazem (CARDIZEM) infusion 5 mg/hr (03/24/15 0157)    [id number], bisacodyl, LORazepam, ondansetron **OR** ondansetron (ZOFRAN) IV  Assesment: She was admitted with COPD exacerbation. She now has acute hypercapnic respiratory failure in addition to her chronic hypoxic respiratory failure. I will check and see what her PCO2 has been in the past. She has COPD exacerbation. She is critically ill and high risk to need mechanical ventilation/life-support  She has atrial flutter with rapid ventricular response. She is on amiodarone and diltiazem. She is also anticoagulated.  She has a history of right breast cancer status post mastectomy Principal Problem:   New onset atrial flutter (HCC) Active Problems:   DCIS (ductal carcinoma in situ) of breast, right, 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, Arimadex   COPD exacerbation ([place])   Obesity   Atrial flutter with rapid ventricular response [facility])   Essential hypertension   Anticoagulation management encounter    Plan: I put her on IV steroids. IV antibiotics although I don't see pneumonia on her previous chest x-ray. Current chest x-ray has just been done. Try BiPAP. She doesn't improve with BiPAP she may require intubation and mechanical ventilation. Discussed with her daughter at bedside.    LOS: 3 days   Randy Castrejon L 03/24/2015, 7:47 AM

## 2015-03-24 NOTE — Progress Notes (Signed)
Subjective:  Trouble breathing and placed on bipap  Objective:  Vital Signs in the last 24 hours: Temp:  [96.6 F (35.9 C)-97.5 F (36.4 C)] 97.5 F (36.4 C) (01/11 0400) Pulse Rate:  [52-141] 108 (01/11 0600) Resp:  [18-41] 29 (01/11 0600) BP: (93-166)/(50-116) 134/58 mmHg (01/11 0600) SpO2:  [88 %-100 %] 98 % (01/11 0600) Weight:  [193 lb 12.6 oz (87.9 kg)] 193 lb 12.6 oz (87.9 kg) (01/11 0500)  Intake/Output from previous day: 01/10 0701 - 01/11 0700 In: 433 [I.V.:433] Out: 1550 [Urine:1550] Intake/Output from this shift:    Physical Exam: NECK: Without JVD, HJR, or bruit LUNGS:Decreased breath sound with scattered rhonchi throughout HEART: Regular rate and rhythm, no murmur, gallop, rub, bruit, thrill, or heave EXTREMITIES: Without cyanosis, clubbing, or edema   Lab Results:  Recent Labs  03/22/15 0443 03/23/15 0420  WBC 11.1* 15.5*  HGB 11.6* 11.5*  PLT 239 257    Recent Labs  03/23/15 0420 03/24/15 0721  NA 142 141  K 4.9 4.5  CL 100* 94*  CO2 32 38*  GLUCOSE 132* 159*  BUN 19 15  CREATININE 0.68 0.56    Recent Labs  03/22/15 0443 03/22/15 1033  TROPONINI <0.03 <0.03   Hepatic Function Panel  Recent Labs  03/23/15 0420  PROT 6.8  ALBUMIN 4.1  AST 38  ALT 57*  ALKPHOS 55  BILITOT 0.4    Recent Labs  03/23/15 0420  CHOL 204*   No results for input(s): PROTIME in the last 72 hours.     Cardiac Studies: Echocardiogram 03/22/2015 Left ventricle: The cavity size was normal. Wall thickness was   normal. Systolic function was normal. The estimated ejection   fraction was in the range of 60% to 65%. Wall motion was normal;   there were no regional wall motion abnormalities. The study was   not technically sufficient to allow evaluation of LV diastolic   dysfunction due to atrial fibrillation. - Mitral valve: There was mild regurgitation. - Tricuspid valve: There was mild-moderate regurgitation. - Pulmonary arteries: Systolic  pressure was mildly to moderately   increased. PA peak pressure: 45 mm Hg (S). - Inferior vena cava: The vessel was dilated. The respirophasic   diameter changes were blunted (< 50%), consistent with elevated   central venous pressure.   Assessment/Plan:  1.Atrial fib/flutter: In/out afib/NSR through the night. NSR currently but rates 108 and respiratory trouble. Continue IV amiodarone  IV diltiazem gtt. Also on Eliquis   2. Hypertension: BP up and down  3. COPD exacerbation: worsening respiratory failure this am with pH of about 7.25 with a PCO2 in the 90s. Started on bipap, IV antibiotics, IV steroids. CXR pending. Dr. Luan Pulling managing.   LOS: 3 days    Ermalinda Barrios 03/24/2015, 7:50 AM   The patient was seen and examined, and I agree with the assessment and plan as documented above, with modifications as noted below.  Pt with worsening respiratory status and now on BiPAP. Managed now by pulmonary. CXR shows worsening bibasilar pneumonia. Rhythm fluctuating with NSR and atrial fibrillation. Will continue both IV amiodarone and diltiazem (5 mg/hr currently) today. Anticoagulated with apixaban.   Kate Sable, MD, [facility]  03/24/2015 10:00 AM

## 2015-03-25 LAB — BASIC METABOLIC PANEL
Anion gap: 5 (ref 5–15)
BUN: 14 mg/dL (ref 6–20)
CALCIUM: 9.1 mg/dL (ref 8.9–10.3)
CHLORIDE: 95 mmol/L — AB (ref 101–111)
CO2: 43 mmol/L — AB (ref 22–32)
CREATININE: 0.59 mg/dL (ref 0.44–1.00)
GLUCOSE: 168 mg/dL — AB (ref 65–99)
POTASSIUM: 4.3 mmol/L (ref 3.5–5.1)
SODIUM: 143 mmol/L (ref 135–145)

## 2015-03-25 LAB — HIV ANTIBODY (ROUTINE TESTING W REFLEX): HIV Screen 4th Generation wRfx: NONREACTIVE

## 2015-03-25 LAB — BLOOD GAS, ARTERIAL
Acid-Base Excess: 16.8 mmol/L — ABNORMAL HIGH (ref 0.0–2.0)
BICARBONATE: 38.8 meq/L — AB (ref 20.0–24.0)
Delivery systems: POSITIVE
Drawn by: 382351
Expiratory PAP: 8
FIO2: 0.35
INSPIRATORY PAP: 18
O2 SAT: 91 %
PATIENT TEMPERATURE: 37
PCO2 ART: 70.2 mmHg — AB (ref 35.0–45.0)
PO2 ART: 62.7 mmHg — AB (ref 80.0–100.0)
RATE: 10 resp/min
pH, Arterial: 7.4 (ref 7.350–7.450)

## 2015-03-25 LAB — CBC
HCT: 36.3 % (ref 36.0–46.0)
HEMOGLOBIN: 11.1 g/dL — AB (ref 12.0–15.0)
MCH: 29.8 pg (ref 26.0–34.0)
MCHC: 30.6 g/dL (ref 30.0–36.0)
MCV: 97.3 fL (ref 78.0–100.0)
PLATELETS: 237 10*3/uL (ref 150–400)
RBC: 3.73 MIL/uL — AB (ref 3.87–5.11)
RDW: 14.4 % (ref 11.5–15.5)
WBC: 13.8 10*3/uL — ABNORMAL HIGH (ref 4.0–10.5)

## 2015-03-25 MED ORDER — DILTIAZEM HCL ER COATED BEADS 120 MG PO CP24
120.0000 mg | ORAL_CAPSULE | Freq: Every day | ORAL | Status: DC
  Administered 2015-03-25: 120 mg via ORAL
  Filled 2015-03-25 (×2): qty 1

## 2015-03-25 MED ORDER — FUROSEMIDE 40 MG PO TABS
40.0000 mg | ORAL_TABLET | Freq: Once | ORAL | Status: AC
  Administered 2015-03-25: 40 mg via ORAL
  Filled 2015-03-25: qty 1

## 2015-03-25 MED ORDER — AMIODARONE HCL 200 MG PO TABS
200.0000 mg | ORAL_TABLET | Freq: Two times a day (BID) | ORAL | Status: DC
  Administered 2015-03-25 – 2015-03-30 (×11): 200 mg via ORAL
  Filled 2015-03-25 (×11): qty 1

## 2015-03-25 NOTE — Progress Notes (Signed)
Subjective: She looks much better this morning. She is sitting up awake and alert on nasal oxygen  Objective: Vital signs in last 24 hours: Temp:  [97.1 F (36.2 C)-98.1 F (36.7 C)] 98.1 F (36.7 C) (01/12 0400) Pulse Rate:  [73-115] 92 (01/12 0645) Resp:  [16-37] 24 (01/12 0645) BP: (105-153)/(49-94) 124/87 mmHg (01/12 0645) SpO2:  [88 %-100 %] 97 % (01/12 0645) FiO2 (%):  [30 %-40 %] 40 % (01/12 0246) Weight:  [88.4 kg (194 lb 14.2 oz)] 88.4 kg (194 lb 14.2 oz) (01/12 0500) Weight change: 0.5 kg (1 lb 1.6 oz) Last BM Date: 03/21/15  Intake/Output from previous day: 01/11 0701 - 01/12 0700 In: 1095.5 [I.V.:795.5; IV Piggyback:300] Out: 2600 [Urine:2600]  PHYSICAL EXAM General appearance: alert, cooperative and mild distress Resp: She has prolonged expiratory phase but no wheezing now Cardio: regular rate and rhythm, S1, S2 normal, no murmur, click, rub or gallop GI: soft, non-tender; bowel sounds normal; no masses,  no organomegaly Extremities: extremities normal, atraumatic, no cyanosis or edema  Lab Results:  Results for orders placed or performed during the hospital encounter of 03/21/15 (from the past 48 hour(s))  Magnesium     Status: None   Collection Time: 03/24/15  7:21 AM  Result Value Ref Range   Magnesium 2.2 1.7 - 2.4 mg/dL  Basic metabolic panel     Status: Abnormal   Collection Time: 03/24/15  7:21 AM  Result Value Ref Range   Sodium 141 135 - 145 mmol/L   Potassium 4.5 3.5 - 5.1 mmol/L   Chloride 94 (L) 101 - 111 mmol/L   CO2 38 (H) 22 - 32 mmol/L   Glucose, Bld 159 (H) 65 - 99 mg/dL   BUN 15 6 - 20 mg/dL   Creatinine, Ser 0.56 0.44 - 1.00 mg/dL   Calcium 9.5 8.9 - 10.3 mg/dL   GFR calc non Af Amer >60 >60 mL/min   GFR calc Af Amer >60 >60 mL/min    Comment: (NOTE) The eGFR has been calculated using the CKD EPI equation. This calculation has not been validated in all clinical situations. eGFR's persistently <60 mL/min signify possible Chronic  Kidney Disease.    Anion gap 9 5 - 15  Blood gas, arterial     Status: Abnormal   Collection Time: 03/24/15  7:40 AM  Result Value Ref Range   O2 Content 2.0 L/min   Delivery systems NASAL CANNULA    pH, Arterial 7.259 (L) 7.350 - 7.450   pCO2 arterial 94.2 (HH) 35.0 - 45.0 mmHg    Comment: CRITICAL RESULT CALLED TO, READ BACK BY AND VERIFIED WITH: NEDINE ROWE,RN BY JESSICA LASLEY,RRT ON 03/24/2015 AT 0743    pO2, Arterial 64.3 (L) 80.0 - 100.0 mmHg   Bicarbonate 34.2 (H) 20.0 - 24.0 mEq/L   Acid-Base Excess 13.4 (H) 0.0 - 2.0 mmol/L   O2 Saturation 88.4 %   Collection site RIGHT RADIAL    Drawn by COLLECTED BY RT    Sample type ARTERIAL    Allens test (pass/fail) PASS PASS  Blood gas, arterial     Status: Abnormal   Collection Time: 03/24/15  9:30 AM  Result Value Ref Range   FIO2 30.00    Delivery systems BILEVEL POSITIVE AIRWAY PRESSURE    Inspiratory PAP 16.0    Expiratory PAP 6.0    pH, Arterial 7.327 (L) 7.350 - 7.450   pCO2 arterial 80.6 (HH) 35.0 - 45.0 mmHg    Comment: CRITICAL RESULT CALLED TO,  READ BACK BY AND VERIFIED WITH: NEDINE ROWE, RN BY ROBIN POWELL,RRT ON 03/24/2015 AT 0935    pO2, Arterial 60.9 (L) 80.0 - 100.0 mmHg   Bicarbonate 36.0 (H) 20.0 - 24.0 mEq/L   Acid-Base Excess 14.6 (H) 0.0 - 2.0 mmol/L   O2 Saturation 89.4 %   Collection site RIGHT BRACHIAL    Drawn by COLLECTED BY RT    Sample type ARTERIAL    Allens test (pass/fail) PASS PASS  Culture, blood (routine x 2) Call MD if unable to obtain prior to antibiotics being given     Status: None (Preliminary result)   Collection Time: 03/24/15  9:30 AM  Result Value Ref Range   Specimen Description BLOOD LEFT HAND    Special Requests BOTTLES DRAWN AEROBIC ONLY 6 CC    Culture PENDING    Report Status PENDING   Culture, blood (routine x 2) Call MD if unable to obtain prior to antibiotics being given     Status: None (Preliminary result)   Collection Time: 03/24/15  9:45 AM  Result Value Ref Range    Specimen Description BLOOD RIGHT HAND    Special Requests BOTTLES DRAWN AEROBIC AND ANAEROBIC 7 CC EACH    Culture PENDING    Report Status PENDING   Strep pneumoniae urinary antigen     Status: None   Collection Time: 03/24/15 10:15 AM  Result Value Ref Range   Strep Pneumo Urinary Antigen NEGATIVE NEGATIVE    Comment:        Infection due to S. pneumoniae cannot be absolutely ruled out since the antigen present may be below the detection limit of the test. Performed at [facility]   Blood gas, arterial     Status: Abnormal   Collection Time: 03/24/15  1:20 PM  Result Value Ref Range   FIO2 40.00    Delivery systems BILEVEL POSITIVE AIRWAY PRESSURE    Mode BILEVEL POSITIVE AIRWAY PRESSURE    Inspiratory PAP 18    Expiratory PAP 7    pH, Arterial 7.371 7.350 - 7.450   pCO2 arterial 72.5 (HH) 35.0 - 45.0 mmHg    Comment: REVIEWED BY  NEDINE ROWE RN BY ROBIN POWELL RRT ON 03/24/15 AT 1330    pO2, Arterial 61.4 (L) 80.0 - 100.0 mmHg   Bicarbonate 37.0 (H) 20.0 - 24.0 mEq/L   Acid-Base Excess 15.0 (H) 0.0 - 2.0 mmol/L   O2 Saturation 91.2 %   Collection site REVIEWED BY    Drawn by [id number]    Sample type ARTERIAL    Allens test (pass/fail) PASS PASS  CBC     Status: Abnormal   Collection Time: 03/25/15  5:21 AM  Result Value Ref Range   WBC 13.8 (H) 4.0 - 10.5 K/uL   RBC 3.73 (L) 3.87 - 5.11 MIL/uL   Hemoglobin 11.1 (L) 12.0 - 15.0 g/dL   HCT 36.3 36.0 - 46.0 %   MCV 97.3 78.0 - 100.0 fL   MCH 29.8 26.0 - 34.0 pg   MCHC 30.6 30.0 - 36.0 g/dL   RDW 14.4 11.5 - 15.5 %   Platelets 237 150 - 400 K/uL  Basic metabolic panel     Status: Abnormal   Collection Time: 03/25/15  5:21 AM  Result Value Ref Range   Sodium 143 135 - 145 mmol/L   Potassium 4.3 3.5 - 5.1 mmol/L   Chloride 95 (L) 101 - 111 mmol/L   CO2 43 (H) 22 - 32 mmol/L  Glucose, Bld 168 (H) 65 - 99 mg/dL   BUN 14 6 - 20 mg/dL   Creatinine, Ser 0.59 0.44 - 1.00 mg/dL   Calcium 9.1 8.9 - 10.3 mg/dL    GFR calc non Af Amer >60 >60 mL/min   GFR calc Af Amer >60 >60 mL/min    Comment: (NOTE) The eGFR has been calculated using the CKD EPI equation. This calculation has not been validated in all clinical situations. eGFR's persistently <60 mL/min signify possible Chronic Kidney Disease.    Anion gap 5 5 - 15  Blood gas, arterial     Status: Abnormal   Collection Time: 03/25/15  5:24 AM  Result Value Ref Range   FIO2 0.35    Delivery systems BILEVEL POSITIVE AIRWAY PRESSURE    LHR 10 resp/min   Inspiratory PAP 18    Expiratory PAP 8    pH, Arterial 7.400 7.350 - 7.450   pCO2 arterial 70.2 (HH) 35.0 - 45.0 mmHg    Comment: CRITICAL RESULT CALLED TO, READ BACK BY AND VERIFIED WITH: M.MCDANIEL,RN BY B.STOPHEL,RRT,RCP ON 03/25/15 AT 05:34.    pO2, Arterial 62.7 (L) 80.0 - 100.0 mmHg   Bicarbonate 38.8 (H) 20.0 - 24.0 mEq/L   Acid-Base Excess 16.8 (H) 0.0 - 2.0 mmol/L   O2 Saturation 91.0 %   Patient temperature 37.0    Collection site LEFT RADIAL    Drawn by [id number]    Sample type ARTERIAL DRAW    Allens test (pass/fail) PASS PASS    ABGS  Recent Labs  03/25/15 0524  PHART 7.400  PO2ART 62.7*  HCO3 38.8*   CULTURES Recent Results (from the past 240 hour(s))  MRSA PCR Screening     Status: None   Collection Time: 03/22/15 12:00 PM  Result Value Ref Range Status   MRSA by PCR NEGATIVE NEGATIVE Final    Comment:        The GeneXpert MRSA Assay (FDA approved for NASAL specimens only), is one component of a comprehensive MRSA colonization surveillance program. It is not intended to diagnose MRSA infection nor to guide or monitor treatment for MRSA infections.   Culture, blood (routine x 2) Call MD if unable to obtain prior to antibiotics being given     Status: None (Preliminary result)   Collection Time: 03/24/15  9:30 AM  Result Value Ref Range Status   Specimen Description BLOOD LEFT HAND  Final   Special Requests BOTTLES DRAWN AEROBIC ONLY 6 CC  Final   Culture  PENDING  Incomplete   Report Status PENDING  Incomplete  Culture, blood (routine x 2) Call MD if unable to obtain prior to antibiotics being given     Status: None (Preliminary result)   Collection Time: 03/24/15  9:45 AM  Result Value Ref Range Status   Specimen Description BLOOD RIGHT HAND  Final   Special Requests BOTTLES DRAWN AEROBIC AND ANAEROBIC 7 CC EACH  Final   Culture PENDING  Incomplete   Report Status PENDING  Incomplete   Studies/Results: Dg Chest Port 1 View  03/24/2015  CLINICAL DATA:  Shortness of breath, respiratory failure; history of emphysema-COPD, cardiac dysrhythmia, former smoker. EXAM: PORTABLE CHEST 1 VIEW COMPARISON:  CT scan of the chest and chest x-ray of March 21, 2015. FINDINGS: The lungs are well-expanded. Increased interstitial density is present in the right mid and lower lung. The hemidiaphragms are obscured bilaterally. Retrocardiac increased density on the left has developed. The cardiac silhouette is mildly enlarged though stable. The  pulmonary vascularity is not clearly engorged. The mediastinum is normal in width. IMPRESSION: Worsening of interstitial densities at both lung bases compatible with pneumonia or less likely atelectasis. There is no significant pleural effusion. Stable cardiomegaly without pulmonary edema. Electronically Signed   By: David  Martinique M.D.   On: 03/24/2015 08:18    Medications:  Prior to Admission:  Prescriptions prior to admission  Medication Sig Dispense Refill Last Dose  . Ascorbic Acid (VITAMIN C PO) Take 1 tablet by mouth daily.   Past Week at Unknown time  . Cholecalciferol (VITAMIN D PO) Take 1 tablet by mouth daily.    Past Week at Unknown time  . Fluticasone-Salmeterol (ADVAIR) 250-50 MCG/DOSE AEPB Inhale 1 puff into the lungs 2 (two) times daily.   03/21/2015 at Unknown time  . ibuprofen (ADVIL,MOTRIN) 200 MG tablet Take 400 mg by mouth every 6 (six) hours as needed for moderate pain.   Past Week at Unknown time  .  OXYGEN Inhale 2 L into the lungs daily.   03/21/2015 at Unknown time  . predniSONE (DELTASONE) 5 MG tablet Take 10 mg by mouth every other day.    03/21/2015 at Unknown time  . PROAIR HFA 108 (90 BASE) MCG/ACT inhaler Inhale 2 puffs into the lungs every 6 (six) hours as needed for wheezing or shortness of breath.    03/21/2015 at Unknown time  . SPIRIVA HANDIHALER 18 MCG inhalation capsule Place 18 mcg into inhaler and inhale daily as needed (for shortness of breath).    03/21/2015 at Unknown time  . SYMBICORT 160-4.5 MCG/ACT inhaler Inhale 2 puffs into the lungs daily as needed (for shortness of breath).    Past Month at Unknown time  . telmisartan-hydrochlorothiazide (MICARDIS HCT) 80-12.5 MG per tablet Take 1 tablet by mouth daily.     03/21/2015 at Unknown time  . Vitamin D, Ergocalciferol, (DRISDOL) 50000 UNITS CAPS capsule Take 50,000 Units by mouth every 30 (thirty) days.   Past Month at Unknown time  . albuterol (PROVENTIL) (2.5 MG/3ML) 0.083% nebulizer solution Take 2.5 mg by nebulization every 6 (six) hours as needed for wheezing or shortness of breath.   03/21/2015 at Unknown time  . levofloxacin (LEVAQUIN) 750 MG tablet Take 1 tablet (750 mg total) by mouth daily. (Patient not taking: Reported on 03/21/2015) 5 tablet 0 Unknown at Unknown time  . predniSONE (DELTASONE) 10 MG tablet Take 1 tablet (10 mg total) by mouth daily with breakfast. Take 6 tablets today and then decrease by 1 tablet daily until none are left. (Patient not taking: Reported on 03/21/2015) 21 tablet 0 Unknown at Unknown time   Scheduled: . apixaban  5 mg Oral BID  . budesonide-formoterol  2 puff Inhalation BID  . ceFEPime (MAXIPIME) IV  1 g Intravenous 3 times per day  . cholecalciferol  400 Units Oral Daily  . digoxin  0.125 mg Oral Daily  . ipratropium  0.5 mg Nebulization BID  . levalbuterol  0.63 mg Nebulization Q6H  . magnesium oxide  400 mg Oral Daily  . methylPREDNISolone (SOLU-MEDROL) injection  80 mg Intravenous Q6H  .  pantoprazole  40 mg Oral Q0600  . pneumococcal 23 valent vaccine  0.5 mL Intramuscular Tomorrow-1000  . sodium chloride  3 mL Intravenous Q12H  . vancomycin  1,000 mg Intravenous Q12H  . [START ON 04/08/2015] Vitamin D (Ergocalciferol)  50,000 Units Oral Q30 days   Continuous: . amiodarone 30 mg/hr (03/25/15 0600)  . diltiazem (CARDIZEM) infusion 2.5 mg/hr (03/25/15 0600)  GYK:[id number], bisacodyl, LORazepam, ondansetron **OR** ondansetron (ZOFRAN) IV  Assesment: She was admitted with atrial flutter with rapid ventricular response, healthcare associated pneumonia and yesterday developed acute hypercapnic and hypoxic respiratory failure. I think she probably has some level of chronic hypercapnic respiratory failure. She is known to have COPD at baseline. She was treated with BiPAP and intensive treatment for COPD and she is much better Principal Problem:   New onset atrial flutter ([place]) Active Problems:   DCIS (ductal carcinoma in situ) of breast, right, 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, Arimadex   COPD exacerbation ([place])   Obesity   Atrial flutter with rapid ventricular response ([place])   Essential hypertension   Anticoagulation management encounter   Acute respiratory failure with hypercapnia (H[facility])   Metabolic encephalopathy   HCAP (healthcare-associated pneumonia)    Plan: Continue current treatments. BiPAP as needed. I will be out of town for the next 3 days    LOS: 4 days   Bo Teicher L 03/25/2015, 7:29 AM

## 2015-03-25 NOTE — Progress Notes (Signed)
[facility] PROGRESS NOTE  Eileen [name] [id number] DOB: October 24, 1945 DOA: 03/21/2015 PCP: Eileen Mccreedy, MD  Assessment/Plan: 1. Acute on chronic respiratory failure with hypercapnia. On 2L O2 at baseline PRN.  ABG 7.4/70.2/62.7. Patient initially required continuous BIPAP therapy for elevated PCO2 respiratory acidosis, she has since been weaned off BIPAP, now on 3.5L [place].   2. COPD exacerbation. Improving with IV steroids. Pulmonology input appreciated. Will continue IV steroids per pulmonology, bronchodilators and abx. 3. HCAP, repeat CXR shows possible bilateral LL PNAs. Will continue IV abx. Afebrile. WBC trending down.  4. New onset Atrial flutter.  Chad2Vasc2 score of 3. Improved with IV Amiodarone and Cardizem drip. Will transition to oral Amiodarone and Cardizem per cardiology. Continue Eliquis. 5. Prolonged Qtc.  EKG from 1/11 shows QTC in normal range. 6. Acute metabolic encephalopathy likely related to hypercapnia, appears to be resolved with BIPAP.  7. Hypertension, stable. Continue to hold ARB per cardiology.  8. Right breast cancer S/P Arimidex therapy. Follows with [employer]. Clinically stable   Code Status: Full DVT prophylaxis: Eliquis Family Communication: Family at bedside. Disposition Plan: Anticipate discharge in 2-3 days.    [facility]  [facility]  Pulmonology  Procedures:    Antibiotics:  Cefepime 1/11>>   Vancomycin 1/11>>  HPI/Subjective: Feels much improved. SOB is significantly better. Still has some wheezing and a cough productive of brown sputum. Denies any CP, nausea, or vomiting.   Objective: Filed Vitals:   03/25/15 0630 03/25/15 0645  BP: 133/91 124/87  Pulse: 99 92  Temp:    Resp: 37 24    Intake/Output Summary (Last 24 hours) at 03/25/15 0730 Last data filed at 03/25/15 0600  Gross per 24 hour  Intake 1095.54 ml  Output   2600 ml  Net -1504.46 ml   Filed Weights   03/23/15 0500 03/24/15 0500  03/25/15 0500  Weight: 88.2 kg (194 lb 7.1 oz) 87.9 kg (193 lb 12.6 oz) 88.4 kg (194 lb 14.2 oz)    Exam:  General: NAD, appears comfortable Cardiovascular: RRR Respiratory: Diminished breath sounds with mild wheezes bilaterally. Abdomen: soft, non tender, no distention , bowel sounds normal Musculoskeletal: 1+ edema b/l   Data Reviewed: Basic Metabolic Panel:  Recent Labs Lab 03/21/15 1745 03/21/15 2340 03/23/15 0420 03/24/15 0721 03/25/15 0521  NA 141  --  142 141 143  K 4.6  --  4.9 4.5 4.3  CL 100*  --  100* 94* 95*  CO2 33*  --  32 38* 43*  GLUCOSE 128*  --  132* 159* 168*  BUN 13  --  19 15 14   CREATININE 0.79  --  0.68 0.56 0.59  CALCIUM 10.0  --  9.1 9.5 9.1  MG  --  1.8  --  2.2  --    Liver Function Tests:  Recent Labs Lab 03/21/15 1745 03/23/15 0420  AST 32 38  ALT 34 57*  ALKPHOS 75 55  BILITOT 0.8 0.4  PROT 7.9 6.8  ALBUMIN 4.7 4.1    CBC:  Recent Labs Lab 03/21/15 1745 03/22/15 0443 03/23/15 0420 03/25/15 0521  WBC 11.4* 11.1* 15.5* 13.8*  NEUTROABS 9.2*  --  12.8*  --   HGB 13.6 11.6* 11.5* 11.1*  HCT 43.3 37.7 37.9 36.3  MCV 96.9 96.9 98.7 97.3  PLT 267 239 257 237   Cardiac Enzymes:  Recent Labs Lab 03/21/15 1745 03/21/15 2135 03/21/15 2340 03/22/15 0443 03/22/15 1033  TROPONINI <0.03 <0.03 <0.03 <0.03 <0.03  BNP (last 3 results)  Recent Labs  03/21/15 1745  BNP 346.0*     Recent Results (from the past 240 hour(s))  MRSA PCR Screening     Status: None   Collection Time: 03/22/15 12:00 PM  Result Value Ref Range Status   MRSA by PCR NEGATIVE NEGATIVE Final    Comment:        The GeneXpert MRSA Assay (FDA approved for NASAL specimens only), is one component of a comprehensive MRSA colonization surveillance program. It is not intended to diagnose MRSA infection nor to guide or monitor treatment for MRSA infections.   Culture, blood (routine x 2) Call MD if unable to obtain prior to antibiotics being given      Status: None (Preliminary result)   Collection Time: 03/24/15  9:30 AM  Result Value Ref Range Status   Specimen Description BLOOD LEFT HAND  Final   Special Requests BOTTLES DRAWN AEROBIC ONLY 6 CC  Final   Culture PENDING  Incomplete   Report Status PENDING  Incomplete  Culture, blood (routine x 2) Call MD if unable to obtain prior to antibiotics being given     Status: None (Preliminary result)   Collection Time: 03/24/15  9:45 AM  Result Value Ref Range Status   Specimen Description BLOOD RIGHT HAND  Final   Special Requests BOTTLES DRAWN AEROBIC AND ANAEROBIC 7 CC EACH  Final   Culture PENDING  Incomplete   Report Status PENDING  Incomplete      Scheduled Meds: . apixaban  5 mg Oral BID  . budesonide-formoterol  2 puff Inhalation BID  . ceFEPime (MAXIPIME) IV  1 g Intravenous 3 times per day  . cholecalciferol  400 Units Oral Daily  . digoxin  0.125 mg Oral Daily  . ipratropium  0.5 mg Nebulization BID  . levalbuterol  0.63 mg Nebulization Q6H  . magnesium oxide  400 mg Oral Daily  . methylPREDNISolone (SOLU-MEDROL) injection  80 mg Intravenous Q6H  . pantoprazole  40 mg Oral Q0600  . pneumococcal 23 valent vaccine  0.5 mL Intramuscular Tomorrow-1000  . sodium chloride  3 mL Intravenous Q12H  . vancomycin  1,000 mg Intravenous Q12H  . [START ON 04/08/2015] Vitamin D (Ergocalciferol)  50,000 Units Oral Q30 days   Continuous Infusions: . amiodarone 30 mg/hr (03/25/15 0600)  . diltiazem (CARDIZEM) infusion 2.5 mg/hr (03/25/15 0600)    Principal Problem:   New onset atrial flutter ([facility]) Active Problems:   DCIS (ductal carcinoma in situ) of breast, right, 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, Arimadex   COPD exacerbation ([place])   Obesity   Atrial flutter with rapid ventricular response ([place])   Essential hypertension   Anticoagulation management encounter   Acute respiratory failure with hypercapnia (HCC)   Metabolic encephalopathy   HCAP (healthcare-associated  pneumonia)    Time spent: 30  minutes   Eileen [name]. MD  [facility] Pager [phone]. If 7PM-7AM, please contact night-coverage at [email or web address], password [facility] 03/25/2015, 7:30 AM  LOS: 4 days       By signing my name below, I, Eileen [name], attest that this documentation has been prepared under the direction and in the presence of [employer]. MD Electronically Signed: Rosalie [name], Scribe. 03/25/2015 9:34am  I, Dr. Kathie Dike, personally performed the services described in this documentaiton. All medical record entries made by the scribe were at my direction and in my presence. I have reviewed the chart and agree that the record reflects my personal performance and is accurate  and complete  Kathie Dike, MD, 03/25/2015 10:01 AM

## 2015-03-25 NOTE — Care Management Note (Signed)
Case Management Note  Patient Details  Name: Eileen [name] MRN: [id number] Date of Birth: [birth year]  Subjective/Objective:                  Pt is from home, lives with her daughter and grandson. Pt is ind at baseline. Pt has home O2 and neb machine prior to admission. PT has seen pt and recommends HH PT and ambulation with walker at DC. Pt is agreeable to [facility] PT at DC and has chosen [facility] for [facility] services and DME.   Action/Plan: [facility], made aware of referral and will obtain pt info from chart. Will deliver walker to pt's room prior to DC. Will cont to follow.   Expected Discharge Date:    03/27/2015              Expected Discharge Plan:  [place]  In-House Referral:  NA  Discharge planning Services  CM Consult  Post Acute Care Choice:  NA Choice offered to:  NA  DME Arranged:  Walker rolling with seat DME Agency:  [place] Arranged:  RN, PT [facility] Agency:  [place]  Status of Service:  In process, will continue to follow  Medicare Important Message Given:    Date Medicare IM Given:    Medicare IM give by:    Date Additional Medicare IM Given:    Additional Medicare Important Message give by:     If discussed at [place] of Stay Meetings, dates discussed:    Additional Comments:  Sherald Barge, RN 03/25/2015, 11:15 AM

## 2015-03-25 NOTE — Progress Notes (Addendum)
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 Resp failure 01/10 req BiPAP. CHADS2VASC=3  SUBJECTIVE: Never had an awareness of her rapid or irregular HR. Breathing much better today.  OBJECTIVE Filed Vitals:   03/25/15 0600 03/25/15 0615 03/25/15 0630 03/25/15 0645  BP: 125/85 130/78 133/91 124/87  Pulse: 96 98 99 92  Temp:      TempSrc:      Resp: 19 21 37 24  Height:      Weight:      SpO2: 95% 96% 94% 97%    Intake/Output Summary (Last 24 hours) at 03/25/15 0837 Last data filed at 03/25/15 0600  Gross per 24 hour  Intake 1095.54 ml  Output   2600 ml  Net -1504.46 ml   Filed Weights   03/23/15 0500 03/24/15 0500 03/25/15 0500  Weight: 194 lb 7.1 oz (88.2 kg) 193 lb 12.6 oz (87.9 kg) 194 lb 14.2 oz (88.4 kg)    PHYSICAL EXAM General: Well developed, well nourished, female in no acute distress. Head: Normocephalic, atraumatic.  Neck: Supple without bruits, JVD not elevated. Lungs:  Resp regular and unlabored, rales bilaterally w/ slight wheeze. Heart: RRR, S1, S2, no S3, S4, or murmur; no rub. Abdomen: Soft, non-tender, non-distended, BS + x 4.  Extremities: No clubbing, cyanosis, edema.  Neuro: Alert and oriented X 3. Moves all extremities spontaneously. Psych: Normal affect.  LABS: CBC:  Recent Labs  03/23/15 0420 03/25/15 0521    WBC 15.5* 13.8*  NEUTROABS 12.8*  --   HGB 11.5* 11.1*  HCT 37.9 36.3  MCV 98.7 97.3  PLT 257 [id number]   Basic Metabolic Panel:  Recent Labs  03/24/15 0721 03/25/15 0521  NA 141 143  K 4.5 4.3  CL 94* 95*  CO2 38* 43*  GLUCOSE 159* 168*  BUN 15 14  CREATININE 0.56 0.59  CALCIUM 9.5 9.1  MG 2.2  --    Liver Function Tests:  Recent Labs  03/23/15 0420  AST 38  ALT 57*  ALKPHOS 55  BILITOT 0.4  PROT 6.8  ALBUMIN 4.1   Cardiac Enzymes:  Recent Labs  03/22/15 1033  TROPONINI <0.03   BNP:  B NATRIURETIC PEPTIDE  Date/Time Value Ref Range Status  03/21/2015 05:45 PM 346.0* 0.0 - 100.0 pg/mL Final   Fasting Lipid Panel:  Recent Labs  03/23/15 0420  CHOL 204*  HDL 85  LDLCALC 102*  TRIG 86  CHOLHDL 2.4   TELE:   Atrial flutter>>atrial fib>>SR since about 10 am 01/10 w/ PACs and some <10bt runs tachycardia   Radiology/Studies: Dg Chest Port 1 View 03/24/2015  CLINICAL DATA:  Shortness of breath, respiratory failure; history of emphysema-COPD, cardiac dysrhythmia, former smoker. EXAM: PORTABLE CHEST 1 VIEW COMPARISON:  CT scan of the chest and chest x-ray of March 21, 2015. FINDINGS: The lungs are well-expanded. Increased interstitial density is present in the right mid  and lower lung. The hemidiaphragms are obscured bilaterally. Retrocardiac increased density on the left has developed. The cardiac silhouette is mildly enlarged though stable. The pulmonary vascularity is not clearly engorged. The mediastinum is normal in width. IMPRESSION: Worsening of interstitial densities at both lung bases compatible with pneumonia or less likely atelectasis. There is no significant pleural effusion. Stable cardiomegaly without pulmonary edema. Electronically Signed   By: David  Martinique M.D.   On: 03/24/2015 08:18     Current Medications:  . apixaban  5 mg Oral BID  . budesonide-formoterol  2 puff Inhalation BID  . ceFEPime (MAXIPIME) IV  1 g Intravenous 3 times per day  .  cholecalciferol  400 Units Oral Daily  . digoxin  0.125 mg Oral Daily  . ipratropium  0.5 mg Nebulization BID  . levalbuterol  0.63 mg Nebulization Q6H  . magnesium oxide  400 mg Oral Daily  . methylPREDNISolone (SOLU-MEDROL) injection  80 mg Intravenous Q6H  . pantoprazole  40 mg Oral Q0600  . pneumococcal 23 valent vaccine  0.5 mL Intramuscular Tomorrow-1000  . sodium chloride  3 mL Intravenous Q12H  . vancomycin  1,000 mg Intravenous Q12H  . [START ON 04/08/2015] Vitamin D (Ergocalciferol)  50,000 Units Oral Q30 days   . amiodarone 30 mg/hr (03/25/15 0600)  . diltiazem (CARDIZEM) infusion 2.5 mg/hr (03/25/15 0600)    ASSESSMENT AND PLAN: Principal Problem:   New onset atrial flutter ([place]) - converted to SR 01/10, some brief spurts of ?atrial fib - Cardizem down to 2.5 mg/hr since 5 am, will change to PO at 120 mg qd. - amio at 30 mg/hr, MD advise on changing this to PO as well.   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 Breathing has significantly improved. No longer on BiPAP. Maintaining sinus rhythm. Will switch both IV diltiazem and amiodarone to oral. I do not plan on keeping her on amiodarone longterm given her baseline pulmonary status. Will give one time dose  of Lasix orally 40 mg. Anticoagulated with apixaban.   Kate Sable, MD, [facility]  03/25/2015 9:32 AM

## 2015-03-26 LAB — BASIC METABOLIC PANEL
Anion gap: 6 (ref 5–15)
BUN: 22 mg/dL — AB (ref 6–20)
CALCIUM: 9 mg/dL (ref 8.9–10.3)
CHLORIDE: 93 mmol/L — AB (ref 101–111)
CO2: 45 mmol/L — AB (ref 22–32)
CREATININE: 0.66 mg/dL (ref 0.44–1.00)
GFR calc non Af Amer: >60 mL/min (ref >60)
GLUCOSE: 179 mg/dL — AB (ref 65–99)
Potassium: 3.7 mmol/L (ref 3.5–5.1)
Sodium: 144 mmol/L (ref 135–145)

## 2015-03-26 LAB — BLOOD GAS, ARTERIAL
Acid-Base Excess: 17 mmol/L — ABNORMAL HIGH (ref 0.0–2.0)
Bicarbonate: 38.9 mEq/L — ABNORMAL HIGH (ref 20.0–24.0)
Drawn by: 221791
O2 Content: 3 L/min
O2 Saturation: 90.3 %
PATIENT TEMPERATURE: 37
PCO2 ART: 69.4 mmHg — AB (ref 35.0–45.0)
PH ART: 7.407 (ref 7.350–7.450)
PO2 ART: 61.2 mmHg — AB (ref 80.0–100.0)
TCO2: 15 mmol/L (ref 0–100)

## 2015-03-26 LAB — CBC
HCT: 35.9 % — ABNORMAL LOW (ref 36.0–46.0)
Hemoglobin: 11.2 g/dL — ABNORMAL LOW (ref 12.0–15.0)
MCH: 30.4 pg (ref 26.0–34.0)
MCHC: 31.2 g/dL (ref 30.0–36.0)
MCV: 97.3 fL (ref 78.0–100.0)
Platelets: 248 10*3/uL (ref 150–400)
RBC: 3.69 MIL/uL — ABNORMAL LOW (ref 3.87–5.11)
RDW: 14.4 % (ref 11.5–15.5)
WBC: 15.9 10*3/uL — AB (ref 4.0–10.5)

## 2015-03-26 MED ORDER — DILTIAZEM HCL ER COATED BEADS 180 MG PO CP24
180.0000 mg | ORAL_CAPSULE | Freq: Every day | ORAL | Status: DC
  Administered 2015-03-26 – 2015-03-29 (×4): 180 mg via ORAL
  Filled 2015-03-26 (×4): qty 1

## 2015-03-26 NOTE — Progress Notes (Signed)
Physical Therapy Treatment Patient Details Name: Eileen [name] MRN: [id number] DOB: 07/17/1945 Today's Date: 03/26/2015    History of Present Illness 70yo black female who comes in to [facility] on 1/8 p 1 week of worsening SOB. Pt found to have new onset atrial flutter c RVR. Pt is at 2L/min at baseline at home only at night only. On 1/9 HR was in 150's mobility in room and significant worsening SOB. At baseline pt is a limited community ambulator, indep in ADL, still driving, and  requires assistace with groceries and some housework.     PT Comments    Pt was found up in chair and reporting feeling much better.  She was on 3 L O2 with resting O2 sat 93-95%.  She was able to do some seated exercise with mild decrease in O2 sat.  She was instructed in gait with a walker and able to ambulate 24' x 2 with no loss of balance.  Her O2 was increased to 4 L/min for gait and O2 sat decreased to 86% after walking.  She should be able to transition to home with daughter's assist.  Follow Up Recommendations  Home health PT     Equipment Recommendations  Rolling walker with 5" wheels    Recommendations for Other Services  none     Precautions / Restrictions Precautions Precautions: None    Mobility  Bed Mobility               General bed mobility comments: recieved up in chair.   Transfers Overall transfer level: Needs assistance Equipment used: Rolling walker (2 wheeled) Transfers: Sit to/from Stand Sit to Stand: Supervision         General transfer comment: supervision for multiple lines/leads.   Ambulation/Gait     Assistive device: Rolling walker (2 wheeled) Gait Pattern/deviations: WFL(Within Functional Limits) Gait velocity: slow pace is appropriate due to severe respiratory compromise Gait velocity interpretation: <1.8 ft/sec, indicative of risk for recurrent falls General Gait Details: stable gait using a walker...pt instructed in correct use of walker   Stairs            Wheelchair Mobility    Modified Rankin (Stroke Patients Only)       Balance Overall balance assessment: No apparent balance deficits (not formally assessed)                                  Cognition Arousal/Alertness: Awake/alert Behavior During Therapy: WFL for tasks assessed/performed Overall Cognitive Status: Within Functional Limits for tasks assessed                      Exercises General Exercises - Lower Extremity Long Arc Quad: AROM;Both;10 reps;Seated Other Exercises Other Exercises: sit to stand x 3 repetitions with no loss of balance    General Comments        Pertinent Vitals/Pain Pain Assessment: No/denies pain    Home Living                      Prior Function            PT Goals (current goals can now be found in the care plan section) Progress towards PT goals: Progressing toward goals    Frequency  Min 3X/week    PT Plan Current plan remains appropriate    Co-evaluation  End of Session Equipment Utilized During Treatment: Gait belt;Oxygen Activity Tolerance: Patient tolerated treatment well Patient left: in chair;with call bell/phone within reach;with family/visitor present     Time: [id number] PT Time Calculation (min) (ACUTE ONLY): 43 min  Charges:  $Gait Training: 8-22 mins $Therapeutic Exercise: 8-22 mins                    G CodesSable Feil  PT 03/26/2015, 1:41 PM [phone]

## 2015-03-26 NOTE — Progress Notes (Signed)
CRITICAL VALUE ALERT  Critical value received:  PCO2 69.4  Date of notification:  03/26/15  Time of notification:  0951  Critical value read back:yes  Nurse who received alert:  Althea Grimmer   MD notified (1st page):  Physician on floor and notified  Time of first page:  na  MD notified (2nd page): Memon   Time of second page:  Responding MD:     Time MD responded:  [phone]

## 2015-03-26 NOTE — Progress Notes (Signed)
Report called to Gershon Cull. Patient ready for transport to 300 via wheelchair.

## 2015-03-26 NOTE — Care Management Important Message (Signed)
Important Message  Patient Details  Name: Eileen [name] MRN: [id number] Date of Birth: 04/06/1945   Medicare Important Message Given:  Yes    Sherald Barge, RN 03/26/2015, 2:26 PM

## 2015-03-26 NOTE — Care Management Note (Signed)
Case Management Note  Patient Details  Name: Eileen [name] MRN: [id number] Date of Birth: Oct 18, 1945  Expected Discharge Date:                  Expected Discharge Plan:  [place]  In-House Referral:  NA  Discharge planning Services  CM Consult  Post Acute Care Choice:  NA Choice offered to:  NA  DME Arranged:  Walker rolling with seat DME Agency:  [facility]  HH Arranged:  RN, PT [place] Agency:  [place]  Status of Service:  Completed, signed off  Medicare Important Message Given:  Yes Date Medicare IM Given:    Medicare IM give by:    Date Additional Medicare IM Given:    Additional Medicare Important Message give by:     If discussed at [place] of Stay Meetings, dates discussed:    Additional Comments: Pt has decided to get walker from [place], information has been faxed and pt will call [place] when [place] to determine if walker will be delivered or picked up. Pt plans to DC home with [facility] services over weekend through [facility]. Romualdo Bolk, of [facility], made aware and will obtain pt info from chart. Pt aware HH has 48 hours to initiate services. Sherald Barge, RN 03/26/2015, 2:27 PM

## 2015-03-26 NOTE — Progress Notes (Addendum)
[facility] PROGRESS NOTE  [name] [place] [id number] DOB: 1946-02-17 DOA: 03/21/2015 PCP: Cleda Mccreedy, MD  Assessment/Plan: 1. Acute on chronic respiratory failure with hypercapnia. On 2L O2 at baseline PRN. Patient initially required continuous BIPAP therapy for elevated PCO2 respiratory acidosis, she has since been weaned off BIPAP, now on . Did not wear BIPAP last night, will order repeat ABG. Wean to baseline O2 as tolerated.  2. COPD exacerbation. Slowly improving with IV steroids. Pulmonology input appreciated. Will continue IV steroids per pulmonology, bronchodilators and abx. 3. HCAP, repeat CXR shows possible bilateral LL PNAs. Since MRSA PCR is negative, will d/c Vancomycin today and continue Cefepime.  Afebrile. WBC 15.9. 4. New onset Atrial flutter.  Chad2Vasc2 score of 3. Improved with IV Amiodarone and Cardizem drip, which were discontinued on 1/12. Since she is mildly tachycardic, will increase oral Cardizem to 180mg  daily and continue oral Amiodarone. Continue Eliquis. 5. Prolonged Qtc.  EKG from 1/11 shows QTC in normal range. 6. Acute metabolic encephalopathy likely related to hypercapnia, appears to be resolved with BIPAP.  7. Hypertension, stable. Continue to hold ARB per cardiology.  8. Right breast cancer S/P Arimidex therapy. Follows with [employer]. Clinically stable   Code Status: Full DVT prophylaxis: Eliquis Family Communication: Family at bedside. Disposition Plan: Transfer to tele today, anticipate discharge in 2-3 days.    [facility]  [facility]  Cardiology  Pulmonology  Procedures:    Antibiotics:  Cefepime 1/11>>   Vancomycin 1/11>>1/13  HPI/Subjective: Feels like her breathing is getting better. Has a nonproductive cough.   Objective: Filed Vitals:   03/26/15 0400 03/26/15 0500  BP: 123/79 128/86  Pulse: 88 91  Temp: 98.5 F (36.9 C)   Resp: 22 23    Intake/Output Summary (Last 24 hours) at 03/26/15  0731 Last data filed at 03/26/15 0500  Gross per 24 hour  Intake 334.48 ml  Output    500 ml  Net -165.52 ml   Filed Weights   03/24/15 0500 03/25/15 0500 03/26/15 0500  Weight: 87.9 kg (193 lb 12.6 oz) 88.4 kg (194 lb 14.2 oz) 88 kg (194 lb 0.1 oz)    Exam:  General: NAD, appears comfortable Cardiovascular: RRR, mildly tachycardic Respiratory: Diminished breath sounds with mild wheezes bilaterally. Abdomen: soft, non tender, no distention , bowel sounds normal Musculoskeletal: No edema b/l   Data Reviewed: Basic Metabolic Panel:  Recent Labs Lab 03/21/15 1745 03/21/15 2340 03/23/15 0420 03/24/15 0721 03/25/15 0521 03/26/15 0438  NA 141  --  142 141 143 144  K 4.6  --  4.9 4.5 4.3 3.7  CL 100*  --  100* 94* 95* 93*  CO2 33*  --  32 38* 43* 45*  GLUCOSE 128*  --  132* 159* 168* 179*  BUN 13  --  19 15 14  22*  CREATININE 0.79  --  0.68 0.56 0.59 0.66  CALCIUM 10.0  --  9.1 9.5 9.1 9.0  MG  --  1.8  --  2.2  --   --    Liver Function Tests:  Recent Labs Lab 03/21/15 1745 03/23/15 0420  AST 32 38  ALT 34 57*  ALKPHOS 75 55  BILITOT 0.8 0.4  PROT 7.9 6.8  ALBUMIN 4.7 4.1    CBC:  Recent Labs Lab 03/21/15 1745 03/22/15 0443 03/23/15 0420 03/25/15 0521 03/26/15 0438  WBC 11.4* 11.1* 15.5* 13.8* 15.9*  NEUTROABS 9.2*  --  12.8*  --   --   HGB 13.6  11.6* 11.5* 11.1* 11.2*  HCT 43.3 37.7 37.9 36.3 35.9*  MCV 96.9 96.9 98.7 97.3 97.3  PLT 267 239 257 237 248   Cardiac Enzymes:  Recent Labs Lab 03/21/15 1745 03/21/15 2135 03/21/15 2340 03/22/15 0443 03/22/15 1033  TROPONINI <0.03 <0.03 <0.03 <0.03 <0.03   BNP (last 3 results)  Recent Labs  03/21/15 1745  BNP 346.0*     Recent Results (from the past 240 hour(s))  MRSA PCR Screening     Status: None   Collection Time: 03/22/15 12:00 PM  Result Value Ref Range Status   MRSA by PCR NEGATIVE NEGATIVE Final    Comment:        The GeneXpert MRSA Assay (FDA approved for NASAL  specimens only), is one component of a comprehensive MRSA colonization surveillance program. It is not intended to diagnose MRSA infection nor to guide or monitor treatment for MRSA infections.   Culture, blood (routine x 2) Call MD if unable to obtain prior to antibiotics being given     Status: None (Preliminary result)   Collection Time: 03/24/15  9:30 AM  Result Value Ref Range Status   Specimen Description BLOOD LEFT HAND  Final   Special Requests BOTTLES DRAWN AEROBIC ONLY 6 CC  Final   Culture NO GROWTH 2 DAYS  Final   Report Status PENDING  Incomplete  Culture, blood (routine x 2) Call MD if unable to obtain prior to antibiotics being given     Status: None (Preliminary result)   Collection Time: 03/24/15  9:45 AM  Result Value Ref Range Status   Specimen Description BLOOD RIGHT HAND  Final   Special Requests BOTTLES DRAWN AEROBIC AND ANAEROBIC 7 CC EACH  Final   Culture NO GROWTH 2 DAYS  Final   Report Status PENDING  Incomplete      Scheduled Meds: . amiodarone  200 mg Oral BID  . apixaban  5 mg Oral BID  . budesonide-formoterol  2 puff Inhalation BID  . ceFEPime (MAXIPIME) IV  1 g Intravenous 3 times per day  . cholecalciferol  400 Units Oral Daily  . digoxin  0.125 mg Oral Daily  . diltiazem  120 mg Oral Daily  . ipratropium  0.5 mg Nebulization BID  . levalbuterol  0.63 mg Nebulization Q6H  . magnesium oxide  400 mg Oral Daily  . methylPREDNISolone (SOLU-MEDROL) injection  80 mg Intravenous Q6H  . pantoprazole  40 mg Oral Q0600  . pneumococcal 23 valent vaccine  0.5 mL Intramuscular Tomorrow-1000  . sodium chloride  3 mL Intravenous Q12H  . vancomycin  1,000 mg Intravenous Q12H  . [START ON 04/08/2015] Vitamin D (Ergocalciferol)  50,000 Units Oral Q30 days   Continuous Infusions:    Principal Problem:   New onset atrial flutter (HCC) Active Problems:   DCIS (ductal carcinoma in situ) of breast, right, 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, Arimadex   COPD  exacerbation ([place])   Obesity   Atrial flutter with rapid ventricular response (HCC)   Essential hypertension   Anticoagulation management encounter   Acute respiratory failure with hypercapnia (HCC)   Metabolic encephalopathy   HCAP (healthcare-associated pneumonia)    Time spent: 30  minutes   Jehanzeb Memon. MD  [facility] Pager [phone]. If 7PM-7AM, please contact night-coverage at [email or web address], password [facility] 03/26/2015, 7:31 AM  LOS: 5 days       By signing my name below, I, Rosalie Doctor, attest that this documentation has been prepared under the direction and in  the presence of [employer]. MD Electronically Signed: Rosalie Doctor, Scribe. 03/26/2015 8:41am  I, Dr. Kathie Dike, personally performed the services described in this documentaiton. All medical record entries made by the scribe were at my direction and in my presence. I have reviewed the chart and agree that the record reflects my personal performance and is accurate and complete  Kathie Dike, MD, 03/26/2015 9:07 AM

## 2015-03-27 LAB — BASIC METABOLIC PANEL
Anion gap: 8 (ref 5–15)
BUN: 25 mg/dL — AB (ref 6–20)
CALCIUM: 9.2 mg/dL (ref 8.9–10.3)
CO2: 44 mmol/L — ABNORMAL HIGH (ref 22–32)
CREATININE: 0.59 mg/dL (ref 0.44–1.00)
Chloride: 93 mmol/L — ABNORMAL LOW (ref 101–111)
GFR calc Af Amer: >60 mL/min (ref >60)
Glucose, Bld: 205 mg/dL — ABNORMAL HIGH (ref 65–99)
POTASSIUM: 4.1 mmol/L (ref 3.5–5.1)
SODIUM: 145 mmol/L (ref 135–145)

## 2015-03-27 LAB — CBC
HCT: 37.1 % (ref 36.0–46.0)
Hemoglobin: 11.5 g/dL — ABNORMAL LOW (ref 12.0–15.0)
MCH: 30.3 pg (ref 26.0–34.0)
MCHC: 31 g/dL (ref 30.0–36.0)
MCV: 97.9 fL (ref 78.0–100.0)
PLATELETS: 274 10*3/uL (ref 150–400)
RBC: 3.79 MIL/uL — AB (ref 3.87–5.11)
RDW: 14.3 % (ref 11.5–15.5)
WBC: 14.3 10*3/uL — ABNORMAL HIGH (ref 4.0–10.5)

## 2015-03-27 MED ORDER — POLYETHYLENE GLYCOL 3350 17 G PO PACK
17.0000 g | PACK | Freq: Every day | ORAL | Status: DC
  Administered 2015-03-27 – 2015-03-28 (×2): 17 g via ORAL
  Filled 2015-03-27 (×3): qty 1

## 2015-03-27 MED ORDER — AMOXICILLIN-POT CLAVULANATE 875-125 MG PO TABS
1.0000 | ORAL_TABLET | Freq: Two times a day (BID) | ORAL | Status: DC
  Administered 2015-03-27 – 2015-03-30 (×7): 1 via ORAL
  Filled 2015-03-27 (×7): qty 1

## 2015-03-27 MED ORDER — METHYLPREDNISOLONE SODIUM SUCC 125 MG IJ SOLR
80.0000 mg | Freq: Two times a day (BID) | INTRAMUSCULAR | Status: DC
  Administered 2015-03-28 – 2015-03-29 (×3): 80 mg via INTRAVENOUS
  Filled 2015-03-27 (×4): qty 2

## 2015-03-27 NOTE — Progress Notes (Signed)
[facility] PROGRESS NOTE  [name] Medici [id number] DOB: 1945-10-17 DOA: 03/21/2015 PCP: Cleda Mccreedy, MD  Assessment/Plan: 1. Acute on chronic respiratory failure with hypercapnia. Noted to be at  2L O2 at baseline PRN. Patient initially required continuous BIPAP therapy for elevated PCO2 respiratory acidosis, she has since been weaned off BIPAP, now on 4L [place]. Has not worn BiPAP in two nights. Repeat ABG showed that PCO2 was stable. Will continue to wean to baseline O2 (2L). 2. COPD exacerbation. Slowly improving. Started on IV steroids, bronchodilators and abx. Will taper Solumedrol.  3. HCAP, repeat CXR shows possible bilateral LL PNAs. Since MRSA PCR is negative, Vancomycin discontinued 1/13 and discontinue Cefepime 1/14. Will start on Augmentin. Remains afebrile with WBC WNL. 4. New onset Atrial flutter. Chad2Vasc2 score of 3. Improved with IV Amiodarone and Cardizem drip, which were discontinued on 1/12. Since she was mildly tachycardic, increased oral Cardizem to 180mg  daily and continued oral Amiodarone. Continue Eliquis. HR is currently improved. Plan to continue Amiodarone for a total of two weeks.  5. Prolonged Qtc.  EKG from 1/11 shows QTC in normal range. 6. Acute metabolic encephalopathy likely related to hypercapnia, appears to be resolved with BIPAP.  7. Hypertension, stable. Continue to hold ARB per cardiology.  8. Right breast cancer S/P Arimidex therapy. Follows with [employer]. Clinically stable   Code Status: Full DVT prophylaxis: Eliquis Family Communication: Family at bedside. Disposition Plan:Anticipate discharge in 24 with HHPT.   Consultants:  PT- [facility] PT  Cardiology  Pulmonology  Procedures:    Antibiotics:  Cefepime 1/11>>   Vancomycin 1/11>>1/13  HPI/Subjective: Mild productive cough ans wheezing. Breathing is improving.   Objective: Filed Vitals:   03/26/15 2125 03/27/15 0550  BP: 143/72 119/61  Pulse: 81 78  Temp: 99 F  (37.2 C) 97.3 F (36.3 C)  Resp: 20 18    Intake/Output Summary (Last 24 hours) at 03/27/15 0640 Last data filed at 03/27/15 0559  Gross per 24 hour  Intake    145 ml  Output   1100 ml  Net   -955 ml   Filed Weights   03/24/15 0500 03/25/15 0500 03/26/15 0500  Weight: 87.9 kg (193 lb 12.6 oz) 88.4 kg (194 lb 14.2 oz) 88 kg (194 lb 0.1 oz)    Exam:  General: NAD, looks comfortable Cardiovascular: RRR, S1, S2  Respiratory: Diminished breath sounds with mild wheezes bilaterally  Abdomen: soft, non tender, no distention , bowel sounds normal Musculoskeletal: No edema b/l   Data Reviewed: Basic Metabolic Panel:  Recent Labs Lab 03/21/15 1745 03/21/15 2340 03/23/15 0420 03/24/15 0721 03/25/15 0521 03/26/15 0438  NA 141  --  142 141 143 144  K 4.6  --  4.9 4.5 4.3 3.7  CL 100*  --  100* 94* 95* 93*  CO2 33*  --  32 38* 43* 45*  GLUCOSE 128*  --  132* 159* 168* 179*  BUN 13  --  19 15 14  22*  CREATININE 0.79  --  0.68 0.56 0.59 0.66  CALCIUM 10.0  --  9.1 9.5 9.1 9.0  MG  --  1.8  --  2.2  --   --    Liver Function Tests:  Recent Labs Lab 03/21/15 1745 03/23/15 0420  AST 32 38  ALT 34 57*  ALKPHOS 75 55  BILITOT 0.8 0.4  PROT 7.9 6.8  ALBUMIN 4.7 4.1    CBC:  Recent Labs Lab 03/21/15 1745 03/22/15 0443 03/23/15 0420 03/25/15 [id number]  03/26/15 0438  WBC 11.4* 11.1* 15.5* 13.8* 15.9*  NEUTROABS 9.2*  --  12.8*  --   --   HGB 13.6 11.6* 11.5* 11.1* 11.2*  HCT 43.3 37.7 37.9 36.3 35.9*  MCV 96.9 96.9 98.7 97.3 97.3  PLT 267 239 257 237 248   Cardiac Enzymes:  Recent Labs Lab 03/21/15 1745 03/21/15 2135 03/21/15 2340 03/22/15 0443 03/22/15 1033  TROPONINI <0.03 <0.03 <0.03 <0.03 <0.03   BNP (last 3 results)  Recent Labs  03/21/15 1745  BNP 346.0*     Recent Results (from the past 240 hour(s))  MRSA PCR Screening     Status: None   Collection Time: 03/22/15 12:00 PM  Result Value Ref Range Status   MRSA by PCR NEGATIVE NEGATIVE Final     Comment:        The GeneXpert MRSA Assay (FDA approved for NASAL specimens only), is one component of a comprehensive MRSA colonization surveillance program. It is not intended to diagnose MRSA infection nor to guide or monitor treatment for MRSA infections.   Culture, blood (routine x 2) Call MD if unable to obtain prior to antibiotics being given     Status: None (Preliminary result)   Collection Time: 03/24/15  9:30 AM  Result Value Ref Range Status   Specimen Description BLOOD LEFT HAND  Final   Special Requests BOTTLES DRAWN AEROBIC ONLY 6 CC  Final   Culture NO GROWTH 2 DAYS  Final   Report Status PENDING  Incomplete  Culture, blood (routine x 2) Call MD if unable to obtain prior to antibiotics being given     Status: None (Preliminary result)   Collection Time: 03/24/15  9:45 AM  Result Value Ref Range Status   Specimen Description BLOOD RIGHT HAND  Final   Special Requests BOTTLES DRAWN AEROBIC AND ANAEROBIC 7 CC EACH  Final   Culture NO GROWTH 2 DAYS  Final   Report Status PENDING  Incomplete      Scheduled Meds: . amiodarone  200 mg Oral BID  . apixaban  5 mg Oral BID  . budesonide-formoterol  2 puff Inhalation BID  . ceFEPime (MAXIPIME) IV  1 g Intravenous 3 times per day  . cholecalciferol  400 Units Oral Daily  . digoxin  0.125 mg Oral Daily  . diltiazem  180 mg Oral Daily  . ipratropium  0.5 mg Nebulization BID  . levalbuterol  0.63 mg Nebulization Q6H  . magnesium oxide  400 mg Oral Daily  . methylPREDNISolone (SOLU-MEDROL) injection  80 mg Intravenous Q6H  . pantoprazole  40 mg Oral Q0600  . pneumococcal 23 valent vaccine  0.5 mL Intramuscular Tomorrow-1000  . sodium chloride  3 mL Intravenous Q12H  . [START ON 04/08/2015] Vitamin D (Ergocalciferol)  50,000 Units Oral Q30 days   Continuous Infusions:    Principal Problem:   New onset atrial flutter (HCC) Active Problems:   DCIS (ductal carcinoma in situ) of breast, right, S/P total mastectomy  10/2009, Arimadex   COPD exacerbation ([place])   Obesity   Atrial flutter with rapid ventricular response (HCC)   Essential hypertension   Anticoagulation management encounter   Acute respiratory failure with hypercapnia (HCC)   Metabolic encephalopathy   HCAP (healthcare-associated pneumonia)    Time spent: 30  minutes   Jehanzeb Memon. MD  [facility] Pager [phone]. If 7PM-7AM, please contact night-coverage at [email or web address], password [facility] 03/27/2015, 6:40 AM  LOS: 6 days        By signing  my name below, I, Rennis Harding, attest that this documentation has been prepared under the direction and in the presence of Kathie Dike, MD. Electronically signed: Rennis Harding, Scribe. 03/27/2015 2:07pm  I, Dr. Kathie Dike, personally performed the services described in this documentaiton. All medical record entries made by the scribe were at my direction and in my presence. I have reviewed the chart and agree that the record reflects my personal performance and is accurate and complete  Kathie Dike, MD, 03/27/2015 2:38 PM

## 2015-03-28 MED ORDER — FUROSEMIDE 10 MG/ML IJ SOLN
20.0000 mg | Freq: Once | INTRAMUSCULAR | Status: AC
  Administered 2015-03-28: 20 mg via INTRAVENOUS
  Filled 2015-03-28: qty 2

## 2015-03-28 NOTE — Progress Notes (Signed)
[facility] PROGRESS NOTE  Eileen [name] [id number] DOB: January 11, 1946 DOA: 03/21/2015 PCP: Cleda Mccreedy, MD  Assessment/Plan: 1. Acute on chronic respiratory failure with hypercapnia. Noted to be at  2L O2 at baseline PRN. Patient initially required continuous BIPAP therapy for elevated PCO2 respiratory acidosis, she has since been weaned off BIPAP, now on 4L [place]. Repeat ABG showed that PCO2 was stable. Will continue to wean to baseline O2 (2L). 2. COPD exacerbation, steroid dependent. Slowly improving. Started on IV steroids, bronchodilators and abx. Will continue to taper Solumedrol.  3. HCAP, repeat CXR shows possible bilateral LL PNAs.   Will continue Augmentin. Remains afebrile with WBC WNL. 4. New onset Atrial flutter. Chad2Vasc2 score of 3. Improved with IV Amiodarone and Cardizem drip, which were discontinued on 1/12. Since she was mildly tachycardic, increased oral Cardizem to 180mg  daily and continued oral Amiodarone. Continue Eliquis. HR is currently improved. Plan to continue Amiodarone for a total of two weeks.  5. Prolonged Qtc.  EKG from 1/11 shows QTC in normal range. 6. Acute metabolic encephalopathy likely related to hypercapnia, appears to be resolved with BIPAP.  7. Hypertension, stable. Continue to hold ARB per cardiology.  8. Right breast cancer S/P Arimidex therapy. Follows with [employer]. Clinically stable   Code Status: Full DVT prophylaxis: Eliquis Family Communication: No family at bedside. Disposition Plan: Anticipate discharge in 24 with HHPT.   Consultants:  PT- [facility] PT  Cardiology  Pulmonology  Procedures:    Antibiotics:  Cefepime 1/11>> 1/14  Vancomycin 1/11>>1/13  Augmentin 1/14>>  HPI/Subjective:  Still has some wheezing and shallow breathing but feels like her SOB is getting better. LE are swelling.   Objective: Filed Vitals:   03/28/15 0530 03/28/15 0742  BP: 152/85   Pulse: 77 79  Temp: 97.9 F (36.6 C)   Resp:  20 17    Intake/Output Summary (Last 24 hours) at 03/28/15 0752 Last data filed at 03/28/15 0526  Gross per 24 hour  Intake    720 ml  Output   1250 ml  Net   -530 ml   Filed Weights   03/24/15 0500 03/25/15 0500 03/26/15 0500  Weight: 87.9 kg (193 lb 12.6 oz) 88.4 kg (194 lb 14.2 oz) 88 kg (194 lb 0.1 oz)    Exam:  General: NAD, looks comfortable Cardiovascular: RRR, S1, S2  Respiratory: Diminished breath sounds with mild wheezes bilaterally  Abdomen: soft, non tender, no distention , bowel sounds normal Musculoskeletal: 1+ edema b/l   Data Reviewed: Basic Metabolic Panel:  Recent Labs Lab 03/21/15 2340 03/23/15 0420 03/24/15 0721 03/25/15 0521 03/26/15 0438 03/27/15 0636  NA  --  142 141 143 144 145  K  --  4.9 4.5 4.3 3.7 4.1  CL  --  100* 94* 95* 93* 93*  CO2  --  32 38* 43* 45* 44*  GLUCOSE  --  132* 159* 168* 179* 205*  BUN  --  19 15 14  22* 25*  CREATININE  --  0.68 0.56 0.59 0.66 0.59  CALCIUM  --  9.1 9.5 9.1 9.0 9.2  MG 1.8  --  2.2  --   --   --    Liver Function Tests:  Recent Labs Lab 03/21/15 1745 03/23/15 0420  AST 32 38  ALT 34 57*  ALKPHOS 75 55  BILITOT 0.8 0.4  PROT 7.9 6.8  ALBUMIN 4.7 4.1    CBC:  Recent Labs Lab 03/21/15 1745 03/22/15 0443 03/23/15 0420 03/25/15 0521 03/26/15  [id number] 03/27/15 0636  WBC 11.4* 11.1* 15.5* 13.8* 15.9* 14.3*  NEUTROABS 9.2*  --  12.8*  --   --   --   HGB 13.6 11.6* 11.5* 11.1* 11.2* 11.5*  HCT 43.3 37.7 37.9 36.3 35.9* 37.1  MCV 96.9 96.9 98.7 97.3 97.3 97.9  PLT 267 239 257 237 248 274   Cardiac Enzymes:  Recent Labs Lab 03/21/15 1745 03/21/15 2135 03/21/15 2340 03/22/15 0443 03/22/15 1033  TROPONINI <0.03 <0.03 <0.03 <0.03 <0.03   BNP (last 3 results)  Recent Labs  03/21/15 1745  BNP 346.0*     Recent Results (from the past 240 hour(s))  MRSA PCR Screening     Status: None   Collection Time: 03/22/15 12:00 PM  Result Value Ref Range Status   MRSA by PCR NEGATIVE  NEGATIVE Final    Comment:        The GeneXpert MRSA Assay (FDA approved for NASAL specimens only), is one component of a comprehensive MRSA colonization surveillance program. It is not intended to diagnose MRSA infection nor to guide or monitor treatment for MRSA infections.   Culture, blood (routine x 2) Call MD if unable to obtain prior to antibiotics being given     Status: None (Preliminary result)   Collection Time: 03/24/15  9:30 AM  Result Value Ref Range Status   Specimen Description BLOOD LEFT HAND  Final   Special Requests BOTTLES DRAWN AEROBIC ONLY 6 CC  Final   Culture NO GROWTH 3 DAYS  Final   Report Status PENDING  Incomplete  Culture, blood (routine x 2) Call MD if unable to obtain prior to antibiotics being given     Status: None (Preliminary result)   Collection Time: 03/24/15  9:45 AM  Result Value Ref Range Status   Specimen Description BLOOD RIGHT HAND  Final   Special Requests BOTTLES DRAWN AEROBIC AND ANAEROBIC 7 CC EACH  Final   Culture NO GROWTH 3 DAYS  Final   Report Status PENDING  Incomplete      Scheduled Meds: . amiodarone  200 mg Oral BID  . amoxicillin-clavulanate  1 tablet Oral Q12H  . apixaban  5 mg Oral BID  . budesonide-formoterol  2 puff Inhalation BID  . cholecalciferol  400 Units Oral Daily  . digoxin  0.125 mg Oral Daily  . diltiazem  180 mg Oral Daily  . ipratropium  0.5 mg Nebulization BID  . levalbuterol  0.63 mg Nebulization Q6H  . magnesium oxide  400 mg Oral Daily  . methylPREDNISolone (SOLU-MEDROL) injection  80 mg Intravenous Q12H  . pantoprazole  40 mg Oral Q0600  . pneumococcal 23 valent vaccine  0.5 mL Intramuscular Tomorrow-1000  . polyethylene glycol  17 g Oral Daily  . sodium chloride  3 mL Intravenous Q12H  . [START ON 04/08/2015] Vitamin D (Ergocalciferol)  50,000 Units Oral Q30 days   Continuous Infusions:    Principal Problem:   New onset atrial flutter (HCC) Active Problems:   DCIS (ductal carcinoma in  situ) of breast, right, 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, Arimadex   COPD exacerbation ([place])   Obesity   Atrial flutter with rapid ventricular response ([place])   Essential hypertension   Anticoagulation management encounter   Acute on chronic respiratory failure (HCC)   Metabolic encephalopathy   HCAP (healthcare-associated pneumonia)    Time spent: 30  minutes   Jehanzeb Memon. MD  [facility] Pager [phone]. If 7PM-7AM, please contact night-coverage at [email or web address], password [facility] 03/28/2015, 7:52 AM  LOS: 7 days     By signing my name below, I, Rosalie Doctor, attest that this documentation has been prepared under the direction and in the presence of [employer]. MD Electronically Signed: Rosalie Doctor, Scribe. 03/28/2015 1:39pm   I, Dr. Kathie Dike, personally performed the services described in this documentaiton. All medical record entries made by the scribe were at my direction and in my presence. I have reviewed the chart and agree that the record reflects my personal performance and is accurate and complete  Kathie Dike, MD, 03/28/2015 2:04 PM

## 2015-03-29 LAB — CULTURE, BLOOD (ROUTINE X 2)
CULTURE: NO GROWTH
CULTURE: NO GROWTH

## 2015-03-29 LAB — BASIC METABOLIC PANEL
ANION GAP: 6 (ref 5–15)
BUN: 24 mg/dL — AB (ref 6–20)
CALCIUM: 9.3 mg/dL (ref 8.9–10.3)
CO2: 46 mmol/L — ABNORMAL HIGH (ref 22–32)
CREATININE: 0.61 mg/dL (ref 0.44–1.00)
Chloride: 91 mmol/L — ABNORMAL LOW (ref 101–111)
GFR calc non Af Amer: >60 mL/min (ref >60)
Glucose, Bld: 241 mg/dL — ABNORMAL HIGH (ref 65–99)
POTASSIUM: 4.8 mmol/L (ref 3.5–5.1)
Sodium: 143 mmol/L (ref 135–145)

## 2015-03-29 MED ORDER — DILTIAZEM HCL ER COATED BEADS 240 MG PO CP24
240.0000 mg | ORAL_CAPSULE | Freq: Every day | ORAL | Status: DC
  Administered 2015-03-30: 240 mg via ORAL
  Filled 2015-03-29: qty 1

## 2015-03-29 MED ORDER — FUROSEMIDE 40 MG PO TABS
40.0000 mg | ORAL_TABLET | Freq: Once | ORAL | Status: AC
  Administered 2015-03-29: 40 mg via ORAL
  Filled 2015-03-29: qty 1

## 2015-03-29 MED ORDER — PREDNISONE 20 MG PO TABS
40.0000 mg | ORAL_TABLET | Freq: Every day | ORAL | Status: DC
  Administered 2015-03-29 – 2015-03-30 (×2): 40 mg via ORAL
  Filled 2015-03-29 (×2): qty 2

## 2015-03-29 MED ORDER — DILTIAZEM HCL 60 MG PO TABS: 60.0000 mg | ORAL_TABLET | Freq: Once | ORAL | Status: DC

## 2015-03-29 NOTE — Progress Notes (Signed)
[facility] PROGRESS NOTE  Eileen [name] [id number] DOB: 01-Nov-1945 DOA: 03/21/2015 PCP: Cleda Mccreedy, MD  Assessment/Plan: 1. Acute on chronic respiratory failure with hypercapnia. Noted to be at  2L O2 at baseline PRN. Patient initially required continuous BIPAP therapy for elevated PCO2 respiratory acidosis, she has since been weaned off BIPAP. She currently is on 3L [place]. Repeat ABG showed that PCO2 was stable. Will continue to wean to baseline O2 (2L). 2. COPD exacerbation, steroid dependent. Slowly improving. Started on IV steroids, bronchodilators and abx. Per pulmonology switched to Prednisone.  3. HCAP, repeat CXR shows possible bilateral LL PNAs. Continue Augmentin. Remains afebrile with WBC WNL. 4. New onset Atrial flutter. Chad2Vasc2 score of 3. Improved with IV Amiodarone and Cardizem drip, which were discontinued on 1/12. Eliquis. Plan to continue Amiodarone for a total of two weeks. Patient had episode of SVT today will increae Cardizem to 240mg . Monitor on telemetry overnight.  5. Prolonged Qtc.  EKG from 1/11 shows QTC in normal range. 6. Acute metabolic encephalopathy likely related to hypercapnia, appears to be resolved with BIPAP.  7. Hypertension, stable. Continue to hold ARB per cardiology.  8. Right breast cancer S/P Arimidex therapy. Follows with [employer]. Clinically stable   Code Status: Full DVT prophylaxis: Eliquis Family Communication: No family at bedside. Disposition Plan: Anticipate discharge in 24 with HHPT.   Consultants:  PT- [facility] PT  Cardiology  Pulmonology  Procedures:    Antibiotics:  Cefepime 1/11>> 1/14  Vancomycin 1/11>>1/13  Augmentin 1/14>>  HPI/Subjective Feels better. Improvement noted in swelling with lasix.   Objective: Filed Vitals:   03/29/15 0612 03/29/15 0909  BP: 113/86   Pulse: 78 73  Temp: 98.2 F (36.8 C)   Resp: 20 19    Intake/Output Summary (Last 24 hours) at 03/29/15 1347 Last data  filed at 03/29/15 1300  Gross per 24 hour  Intake    720 ml  Output   2850 ml  Net  -2130 ml   Filed Weights   03/24/15 0500 03/25/15 0500 03/26/15 0500  Weight: 87.9 kg (193 lb 12.6 oz) 88.4 kg (194 lb 14.2 oz) 88 kg (194 lb 0.1 oz)    Exam:  General: NAD Cardiovascular: RRR, S1, S2  Respiratory: CTAB  Abdomen: soft, non tender, no distention , bowel sounds normal Musculoskeletal: 1+ edema b/l   Data Reviewed: Basic Metabolic Panel:  Recent Labs Lab 03/24/15 0721 03/25/15 0521 03/26/15 0438 03/27/15 0636 03/29/15 0708  NA 141 143 144 145 143  K 4.5 4.3 3.7 4.1 4.8  CL 94* 95* 93* 93* 91*  CO2 38* 43* 45* 44* 46*  GLUCOSE 159* 168* 179* 205* 241*  BUN 15 14 22* 25* 24*  CREATININE 0.56 0.59 0.66 0.59 0.61  CALCIUM 9.5 9.1 9.0 9.2 9.3  MG 2.2  --   --   --   --    Liver Function Tests:  Recent Labs Lab 03/23/15 0420  AST 38  ALT 57*  ALKPHOS 55  BILITOT 0.4  PROT 6.8  ALBUMIN 4.1    CBC:  Recent Labs Lab 03/23/15 0420 03/25/15 0521 03/26/15 0438 03/27/15 0636  WBC 15.5* 13.8* 15.9* 14.3*  NEUTROABS 12.8*  --   --   --   HGB 11.5* 11.1* 11.2* 11.5*  HCT 37.9 36.3 35.9* 37.1  MCV 98.7 97.3 97.3 97.9  PLT 257 237 248 274   Cardiac Enzymes: No results for input(s): CKTOTAL, CKMB, CKMBINDEX, TROPONINI in the last 168 hours. BNP (last 3  results)  Recent Labs  03/21/15 1745  BNP 346.0*     Recent Results (from the past 240 hour(s))  MRSA PCR Screening     Status: None   Collection Time: 03/22/15 12:00 PM  Result Value Ref Range Status   MRSA by PCR NEGATIVE NEGATIVE Final    Comment:        The GeneXpert MRSA Assay (FDA approved for NASAL specimens only), is one component of a comprehensive MRSA colonization surveillance program. It is not intended to diagnose MRSA infection nor to guide or monitor treatment for MRSA infections.   Culture, blood (routine x 2) Call MD if unable to obtain prior to antibiotics being given     Status:  None   Collection Time: 03/24/15  9:30 AM  Result Value Ref Range Status   Specimen Description BLOOD LEFT HAND  Final   Special Requests BOTTLES DRAWN AEROBIC ONLY 6 CC  Final   Culture NO GROWTH 5 DAYS  Final   Report Status 03/29/2015 FINAL  Final  Culture, blood (routine x 2) Call MD if unable to obtain prior to antibiotics being given     Status: None   Collection Time: 03/24/15  9:45 AM  Result Value Ref Range Status   Specimen Description BLOOD RIGHT HAND  Final   Special Requests BOTTLES DRAWN AEROBIC AND ANAEROBIC 7 CC EACH  Final   Culture NO GROWTH 5 DAYS  Final   Report Status 03/29/2015 FINAL  Final      Scheduled Meds: . amiodarone  200 mg Oral BID  . amoxicillin-clavulanate  1 tablet Oral Q12H  . apixaban  5 mg Oral BID  . budesonide-formoterol  2 puff Inhalation BID  . cholecalciferol  400 Units Oral Daily  . digoxin  0.125 mg Oral Daily  . diltiazem  180 mg Oral Daily  . ipratropium  0.5 mg Nebulization BID  . levalbuterol  0.63 mg Nebulization Q6H  . magnesium oxide  400 mg Oral Daily  . pantoprazole  40 mg Oral Q0600  . pneumococcal 23 valent vaccine  0.5 mL Intramuscular Tomorrow-1000  . polyethylene glycol  17 g Oral Daily  . predniSONE  40 mg Oral Q breakfast  . sodium chloride  3 mL Intravenous Q12H  . [START ON 04/08/2015] Vitamin D (Ergocalciferol)  50,000 Units Oral Q30 days   Continuous Infusions:    Principal Problem:   New onset atrial flutter (HCC) Active Problems:   DCIS (ductal carcinoma in situ) of breast, right, 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, Arimadex   COPD exacerbation ([place])   Obesity   Atrial flutter with rapid ventricular response (HCC)   Essential hypertension   Anticoagulation management encounter   Acute on chronic respiratory failure (HCC)   Metabolic encephalopathy   HCAP (healthcare-associated pneumonia)    Time spent: 25 minutes   Jehanzeb Memon. MD  [facility] Pager [phone]. If 7PM-7AM, please contact  night-coverage at [email or web address], password [facility] 03/29/2015, 1:47 PM  LOS: 8 days   By signing my name below, I, Rennis Harding, attest that this documentation has been prepared under the direction and in the presence of Kathie Dike, MD. Electronically signed: Rennis Harding, Scribe. 03/29/2015 1:45pm    I, Dr. Kathie Dike, personally performed the services described in this documentaiton. All medical record entries made by the scribe were at my direction and in my presence. I have reviewed the chart and agree that the record reflects my personal performance and is accurate and complete  Kathie Dike, MD,  03/29/2015 1:59 PM

## 2015-03-29 NOTE — Progress Notes (Signed)
Subjective: She says she feels better. She has no new complaints. Her breathing is better.  Objective: Vital signs in last 24 hours: Temp:  [98.1 F (36.7 C)-98.6 F (37 C)] 98.2 F (36.8 C) (01/16 0612) Pulse Rate:  [78-86] 78 (01/16 0612) Resp:  [20] 20 (01/16 0612) BP: (113-142)/(63-86) 113/86 mmHg (01/16 0612) SpO2:  [89 %-97 %] 97 % (01/16 0612) Weight change:  Last BM Date: 03/28/15 (small BM per patient.)  Intake/Output from previous day: 01/15 0701 - 01/16 0700 In: 720 [P.O.:720] Out: 2350 [Urine:2350]  PHYSICAL EXAM General appearance: alert, cooperative and no distress Resp: Decreased breath sounds and prolonged expiratory phase but no wheezing Cardio: Her heart is regular to examination. GI: soft, non-tender; bowel sounds normal; no masses,  no organomegaly Extremities: extremities normal, atraumatic, no cyanosis or edema  Lab Results:  Results for orders placed or performed during the hospital encounter of 03/21/15 (from the past 48 hour(s))  Basic metabolic panel     Status: Abnormal   Collection Time: 03/29/15  7:08 AM  Result Value Ref Range   Sodium 143 135 - 145 mmol/L   Potassium 4.8 3.5 - 5.1 mmol/L   Chloride 91 (L) 101 - 111 mmol/L   CO2 46 (H) 22 - 32 mmol/L   Glucose, Bld 241 (H) 65 - 99 mg/dL   BUN 24 (H) 6 - 20 mg/dL   Creatinine, Ser 0.61 0.44 - 1.00 mg/dL   Calcium 9.3 8.9 - 10.3 mg/dL   GFR calc non Af Amer >60 >60 mL/min   GFR calc Af Amer >60 >60 mL/min    Comment: (NOTE) The eGFR has been calculated using the CKD EPI equation. This calculation has not been validated in all clinical situations. eGFR's persistently <60 mL/min signify possible Chronic Kidney Disease.    Anion gap 6 5 - 15    ABGS  Recent Labs  03/26/15 0939  PHART 7.407  PO2ART 61.2*  TCO2 15.0  HCO3 38.9*   CULTURES Recent Results (from the past 240 hour(s))  MRSA PCR Screening     Status: None   Collection Time: 03/22/15 12:00 PM  Result Value Ref Range  Status   MRSA by PCR NEGATIVE NEGATIVE Final    Comment:        The GeneXpert MRSA Assay (FDA approved for NASAL specimens only), is one component of a comprehensive MRSA colonization surveillance program. It is not intended to diagnose MRSA infection nor to guide or monitor treatment for MRSA infections.   Culture, blood (routine x 2) Call MD if unable to obtain prior to antibiotics being given     Status: None (Preliminary result)   Collection Time: 03/24/15  9:30 AM  Result Value Ref Range Status   Specimen Description BLOOD LEFT HAND  Final   Special Requests BOTTLES DRAWN AEROBIC ONLY 6 CC  Final   Culture NO GROWTH 4 DAYS  Final   Report Status PENDING  Incomplete  Culture, blood (routine x 2) Call MD if unable to obtain prior to antibiotics being given     Status: None (Preliminary result)   Collection Time: 03/24/15  9:45 AM  Result Value Ref Range Status   Specimen Description BLOOD RIGHT HAND  Final   Special Requests BOTTLES DRAWN AEROBIC AND ANAEROBIC 7 CC EACH  Final   Culture NO GROWTH 4 DAYS  Final   Report Status PENDING  Incomplete   Studies/Results: No results found.  Medications:  Prior to Admission:  Prescriptions prior to admission  Medication Sig Dispense Refill Last Dose  . Ascorbic Acid (VITAMIN C PO) Take 1 tablet by mouth daily.   Past Week at Unknown time  . Cholecalciferol (VITAMIN D PO) Take 1 tablet by mouth daily.    Past Week at Unknown time  . Fluticasone-Salmeterol (ADVAIR) 250-50 MCG/DOSE AEPB Inhale 1 puff into the lungs 2 (two) times daily.   03/21/2015 at Unknown time  . ibuprofen (ADVIL,MOTRIN) 200 MG tablet Take 400 mg by mouth every 6 (six) hours as needed for moderate pain.   Past Week at Unknown time  . OXYGEN Inhale 2 L into the lungs daily.   03/21/2015 at Unknown time  . predniSONE (DELTASONE) 5 MG tablet Take 10 mg by mouth every other day.    03/21/2015 at Unknown time  . PROAIR HFA 108 (90 BASE) MCG/ACT inhaler Inhale 2 puffs into  the lungs every 6 (six) hours as needed for wheezing or shortness of breath.    03/21/2015 at Unknown time  . SPIRIVA HANDIHALER 18 MCG inhalation capsule Place 18 mcg into inhaler and inhale daily as needed (for shortness of breath).    03/21/2015 at Unknown time  . SYMBICORT 160-4.5 MCG/ACT inhaler Inhale 2 puffs into the lungs daily as needed (for shortness of breath).    Past Month at Unknown time  . telmisartan-hydrochlorothiazide (MICARDIS HCT) 80-12.5 MG per tablet Take 1 tablet by mouth daily.     03/21/2015 at Unknown time  . Vitamin D, Ergocalciferol, (DRISDOL) 50000 UNITS CAPS capsule Take 50,000 Units by mouth every 30 (thirty) days.   Past Month at Unknown time  . albuterol (PROVENTIL) (2.5 MG/3ML) 0.083% nebulizer solution Take 2.5 mg by nebulization every 6 (six) hours as needed for wheezing or shortness of breath.   03/21/2015 at Unknown time  . levofloxacin (LEVAQUIN) 750 MG tablet Take 1 tablet (750 mg total) by mouth daily. (Patient not taking: Reported on 03/21/2015) 5 tablet 0 Unknown at Unknown time  . predniSONE (DELTASONE) 10 MG tablet Take 1 tablet (10 mg total) by mouth daily with breakfast. Take 6 tablets today and then decrease by 1 tablet daily until none are left. (Patient not taking: Reported on 03/21/2015) 21 tablet 0 Unknown at Unknown time   Scheduled: . amiodarone  200 mg Oral BID  . amoxicillin-clavulanate  1 tablet Oral Q12H  . apixaban  5 mg Oral BID  . budesonide-formoterol  2 puff Inhalation BID  . cholecalciferol  400 Units Oral Daily  . digoxin  0.125 mg Oral Daily  . diltiazem  180 mg Oral Daily  . ipratropium  0.5 mg Nebulization BID  . levalbuterol  0.63 mg Nebulization Q6H  . magnesium oxide  400 mg Oral Daily  . pantoprazole  40 mg Oral Q0600  . pneumococcal 23 valent vaccine  0.5 mL Intramuscular Tomorrow-1000  . polyethylene glycol  17 g Oral Daily  . predniSONE  40 mg Oral Q breakfast  . sodium chloride  3 mL Intravenous Q12H  . [START ON 04/08/2015]  Vitamin D (Ergocalciferol)  50,000 Units Oral Q30 days   Continuous:  JJH:ERDEYCXKGYJEH, bisacodyl, LORazepam, ondansetron **OR** ondansetron (ZOFRAN) IV  Assesment: She came to the hospital with shortness of breath and was found to have acute on chronic respiratory failure. She developed increasing problems with elevated PCO2 requiring BiPAP. She is much improved. At baseline she has chronic hypoxic respiratory failure. In addition she was noted to have new onset atrial flutter with rapid ventricular response which is better. She had  metabolic encephalopathy likely related to elevation of PCO2. 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 If she is able to go home she should be on a prednisone taper. She probably needs about 5 more days of by mouth antibiotics.    LOS: 8 days   Hailea Eaglin L 03/29/2015, 8:10 AM

## 2015-03-29 NOTE — Discharge Summary (Signed)
Physician Discharge Summary  Eileen [name] [id number] DOB: Dec 08, 1945 DOA: 03/21/2015  PCP: Cleda Mccreedy, MD  Admit date: 03/21/2015 Discharge date: 03/29/2015  Time spent: 35 minutes  Recommendations for Outpatient Follow-up:  1. Follow up with PCP within 1-2 weeks. 2. Follow up with Dr. Luan Pulling, pulmonology, as scheduled. 3. Follow up with Cardiology in two weeks.  4. Discharge home health PT.  Discharge Diagnoses:  Principal Problem:   New onset atrial flutter (HCC) Active Problems:   DCIS (ductal carcinoma in situ) of breast, right, 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, Arimadex   COPD exacerbation (HCC)   Obesity   Atrial flutter with rapid ventricular response (HCC)   Essential hypertension   Anticoagulation management encounter   Acute on chronic respiratory failure (HCC)   Metabolic encephalopathy   HCAP (healthcare-associated pneumonia)   Discharge Condition: Improved   Diet recommendation: Heart healthy   Filed Weights   03/24/15 0500 03/25/15 0500 03/26/15 0500  Weight: 87.9 kg (193 lb 12.6 oz) 88.4 kg (194 lb 14.2 oz) 88 kg (194 lb 0.1 oz)    History of present illness:  15 yof with PMH of COPD on home oxygen and chronic steroid use, asthma, right breast cancer s/p right mastectomy, HTN presented with complaints of progressive SOB. While in the ED found to have new onset atrial flutter with RVR. Admitted for further evaluation and treatment.   Hospital Course:  On admission noted to be an acute on chronic respiratory failure with hypercapnia. Patient reported that she is on 2L O2 at baseline PRN. Patient initially required continuous BIPAP therapy for elevated PCO2 respiratory acidosis, and was weaned off 1/12. On discharge she has been weaned down to 3L [place]. Repeat ABG on 1/13 revealed that PCO2 remained stable.  1. COPD exacerbation, steroid dependent. Slowly improving. Started on IV steroids, bronchodilators and abx. Pulmonology consulted and agreed with plan. On  discharged Solumedrol was transitioned to Prednisone steroid taper. She appears to be approaching her baseline respiratory status. 2. HCAP, repeat CXR shows possible bilateral LL PNAs.Started on IV abx and transitioned to oral on discharge. Remained afebrile with WBC WNL. 3. New onset Atrial flutter. Chad2Vasc2 score of 3. Improved with IV Amiodarone and Cardizem drip, which were discontinued on 1/12. Her heart rate is now stable after adjusting oral Cardizem and Amiodarone dosing. Plan to continue Amiodarone for a total of two weeks. She is also on Digoxin.   4. Prolonged Qtc. EKG from 1/11 shows QTC in normal range. 5. Acute metabolic encephalopathy likely related to hypercapnia, appears to have resolved with BIPAP.  6. Hypertension, stable. Continue to hold ARB per cardiology.  7. Right breast cancer S/P Arimidex therapy. Follows with [facility]. Clinically stable  Procedures:  None  Consultants   PT- [facility]  Cardiology  Pulmonology  Discharge Exam: Filed Vitals:   03/28/15 2147 03/29/15 0612  BP: 120/73 113/86  Pulse: 81 78  Temp: 98.1 F (36.7 C) 98.2 F (36.8 C)  Resp: 20 20    General: NAD Cardiovascular: RRR, S1, S2  Respiratory: clear bilaterally, No wheezing, rales or rhonchi Abdomen: soft, non tender, no distention , bowel sounds normal Musculoskeletal: No edema b/l  Discharge Instructions    Current Discharge Medication List    CONTINUE these medications which have NOT CHANGED   Details  Ascorbic Acid (VITAMIN C PO) Take 1 tablet by mouth daily.    Cholecalciferol (VITAMIN D PO) Take 1 tablet by mouth daily.     Fluticasone-Salmeterol (ADVAIR) 250-50 MCG/DOSE AEPB  Inhale 1 puff into the lungs 2 (two) times daily.    ibuprofen (ADVIL,MOTRIN) 200 MG tablet Take 400 mg by mouth every 6 (six) hours as needed for moderate pain.    OXYGEN Inhale 2 L into the lungs daily.    !! predniSONE (DELTASONE) 5 MG tablet Take 10 mg by mouth every other  day.     PROAIR HFA 108 (90 BASE) MCG/ACT inhaler Inhale 2 puffs into the lungs every 6 (six) hours as needed for wheezing or shortness of breath.     SPIRIVA HANDIHALER 18 MCG inhalation capsule Place 18 mcg into inhaler and inhale daily as needed (for shortness of breath).     SYMBICORT 160-4.5 MCG/ACT inhaler Inhale 2 puffs into the lungs daily as needed (for shortness of breath).     telmisartan-hydrochlorothiazide (MICARDIS HCT) 80-12.5 MG per tablet Take 1 tablet by mouth daily.      Vitamin D, Ergocalciferol, (DRISDOL) 50000 UNITS CAPS capsule Take 50,000 Units by mouth every 30 (thirty) days.    albuterol (PROVENTIL) (2.5 MG/3ML) 0.083% nebulizer solution Take 2.5 mg by nebulization every 6 (six) hours as needed for wheezing or shortness of breath.    levofloxacin (LEVAQUIN) 750 MG tablet Take 1 tablet (750 mg total) by mouth daily. Qty: 5 tablet, Refills: 0    !! predniSONE (DELTASONE) 10 MG tablet Take 1 tablet (10 mg total) by mouth daily with breakfast. Take 6 tablets today and then decrease by 1 tablet daily until none are left. Qty: 21 tablet, Refills: 0     !! - Potential duplicate medications found. Please discuss with provider.     Allergies  Allergen Reactions  . Codeine Other (See Comments)    "jittery"      The results of significant diagnostics from this hospitalization (including imaging, microbiology, ancillary and laboratory) are listed below for reference.    Significant Diagnostic Studies: Dg Chest 2 View  03/21/2015  CLINICAL DATA:  Shortness of breath.  Left upper quadrant pain. EXAM: CHEST  2 VIEW COMPARISON:  PA and lateral chest 12/20/2014. FINDINGS: The lungs are clear. Heart size is upper normal. No pneumothorax or pleural effusion. No focal bony abnormality. IMPRESSION: No acute disease. Electronically Signed   By: Inge Rise M.D.   On: 03/21/2015 18:57   Ct Angio Chest Pe W/cm &/or Wo Cm  03/21/2015  CLINICAL DATA:  Shortness of breath with  lower extremity swelling for 3 days. EXAM: CT ANGIOGRAPHY CHEST WITH CONTRAST TECHNIQUE: Multidetector CT imaging of the chest was performed using the standard protocol during bolus administration of intravenous contrast. Multiplanar CT image reconstructions and MIPs were obtained to evaluate the vascular anatomy. CONTRAST:  152mL OMNIPAQUE IOHEXOL 350 MG/ML SOLN COMPARISON:  Radiographs earlier this day. FINDINGS: There are no filling defects within the pulmonary arteries to suggest pulmonary embolus. Main pulmonary artery upper limits normal in caliber measuring 3.3 cm. Thoracic aorta normal in caliber without dissection or aneurysm. Dilatation of the right heart with contrast refluxing into the hepatic veins and IVC. No pleural or pericardial effusion. No mediastinal or hilar adenopathy. Advanced apical predominant emphysema. Breathing motion artifact through the lower lungs partially obscures evaluation. There is lower lobe bronchial thickening. No confluent airspace disease to suggest pneumonia. No pulmonary mass. No evidence pulmonary nodule, with assessment limited by patient motion. Post right mastectomy. No axillary adenopathy. Esophagus and thyroid gland are normal. Evaluation of the upper abdomen demonstrates prominent liver size, partially included. Likely motion through the sternum, less likely sternal  fracture. Otherwise no acute or suspicious osseous abnormality. Review of the MIP images confirms the above findings. IMPRESSION: 1. No pulmonary embolus. 2. Right heart distension with contrast refluxing into the hepatic veins and IVC, consistent with right heart failure. 3. Advanced upper lobe emphysema.  Lower lobe bronchial thickening. Electronically Signed   By: Jeb Levering M.D.   On: 03/21/2015 23:20   Dg Chest Port 1 View  03/24/2015  CLINICAL DATA:  Shortness of breath, respiratory failure; history of emphysema-COPD, cardiac dysrhythmia, former smoker. EXAM: PORTABLE CHEST 1 VIEW  COMPARISON:  CT scan of the chest and chest x-ray of March 21, 2015. FINDINGS: The lungs are well-expanded. Increased interstitial density is present in the right mid and lower lung. The hemidiaphragms are obscured bilaterally. Retrocardiac increased density on the left has developed. The cardiac silhouette is mildly enlarged though stable. The pulmonary vascularity is not clearly engorged. The mediastinum is normal in width. IMPRESSION: Worsening of interstitial densities at both lung bases compatible with pneumonia or less likely atelectasis. There is no significant pleural effusion. Stable cardiomegaly without pulmonary edema. Electronically Signed   By: David  Martinique M.D.   On: 03/24/2015 08:18    Microbiology: Recent Results (from the past 240 hour(s))  MRSA PCR Screening     Status: None   Collection Time: 03/22/15 12:00 PM  Result Value Ref Range Status   MRSA by PCR NEGATIVE NEGATIVE Final    Comment:        The GeneXpert MRSA Assay (FDA approved for NASAL specimens only), is one component of a comprehensive MRSA colonization surveillance program. It is not intended to diagnose MRSA infection nor to guide or monitor treatment for MRSA infections.   Culture, blood (routine x 2) Call MD if unable to obtain prior to antibiotics being given     Status: None (Preliminary result)   Collection Time: 03/24/15  9:30 AM  Result Value Ref Range Status   Specimen Description BLOOD LEFT HAND  Final   Special Requests BOTTLES DRAWN AEROBIC ONLY 6 CC  Final   Culture NO GROWTH 4 DAYS  Final   Report Status PENDING  Incomplete  Culture, blood (routine x 2) Call MD if unable to obtain prior to antibiotics being given     Status: None (Preliminary result)   Collection Time: 03/24/15  9:45 AM  Result Value Ref Range Status   Specimen Description BLOOD RIGHT HAND  Final   Special Requests BOTTLES DRAWN AEROBIC AND ANAEROBIC 7 CC EACH  Final   Culture NO GROWTH 4 DAYS  Final   Report Status  PENDING  Incomplete     Labs: Basic Metabolic Panel:  Recent Labs Lab 03/23/15 0420 03/24/15 0721 03/25/15 0521 03/26/15 0438 03/27/15 0636  NA 142 141 143 144 145  K 4.9 4.5 4.3 3.7 4.1  CL 100* 94* 95* 93* 93*  CO2 32 38* 43* 45* 44*  GLUCOSE 132* 159* 168* 179* 205*  BUN 19 15 14  22* 25*  CREATININE 0.68 0.56 0.59 0.66 0.59  CALCIUM 9.1 9.5 9.1 9.0 9.2  MG  --  2.2  --   --   --    Liver Function Tests:  Recent Labs Lab 03/23/15 0420  AST 38  ALT 57*  ALKPHOS 55  BILITOT 0.4  PROT 6.8  ALBUMIN 4.1   CBC:  Recent Labs Lab 03/23/15 0420 03/25/15 0521 03/26/15 0438 03/27/15 0636  WBC 15.5* 13.8* 15.9* 14.3*  NEUTROABS 12.8*  --   --   --  HGB 11.5* 11.1* 11.2* 11.5*  HCT 37.9 36.3 35.9* 37.1  MCV 98.7 97.3 97.3 97.9  PLT 257 237 248 274   Cardiac Enzymes:  Recent Labs Lab 03/22/15 1033  TROPONINI <0.03   BNP: BNP (last 3 results)  Recent Labs  03/21/15 1745  BNP 346.0*     Signed:  Kathie Dike, MD [facility] 03/29/2015, 7:04 AM    By signing my name below, I, Rennis Harding, attest that this documentation has been prepared under the direction and in the presence of Kathie Dike, MD. Electronically signed: Rennis Harding, Scribe. 03/30/2015 11:00am   I, Dr. Kathie Dike, personally performed the services described in this documentaiton. All medical record entries made by the scribe were at my direction and in my presence. I have reviewed the chart and agree that the record reflects my personal performance and is accurate and complete  Kathie Dike, MD, 03/30/2015 11:29 AM

## 2015-03-30 MED ORDER — AMIODARONE HCL 200 MG PO TABS: 200.0000 mg | ORAL_TABLET | Freq: Two times a day (BID) | ORAL | Status: DC

## 2015-03-30 MED ORDER — PANTOPRAZOLE SODIUM 40 MG PO TBEC: 40.0000 mg | DELAYED_RELEASE_TABLET | Freq: Every day | ORAL | Status: DC

## 2015-03-30 MED ORDER — DILTIAZEM HCL ER COATED BEADS 240 MG PO CP24: 240.0000 mg | ORAL_CAPSULE | Freq: Every day | ORAL | Status: DC

## 2015-03-30 MED ORDER — APIXABAN 5 MG PO TABS: 5.0000 mg | ORAL_TABLET | Freq: Two times a day (BID) | ORAL | Status: DC

## 2015-03-30 MED ORDER — AMOXICILLIN-POT CLAVULANATE 875-125 MG PO TABS: 1.0000 | ORAL_TABLET | Freq: Two times a day (BID) | ORAL | Status: DC

## 2015-03-30 MED ORDER — DIGOXIN 125 MCG PO TABS
0.1250 mg | ORAL_TABLET | Freq: Every day | ORAL | Status: DC
Start: 2015-03-30 — End: 2015-06-04

## 2015-03-30 MED ORDER — SODIUM CHLORIDE 0.9 % IJ SOLN: 10.0000 mL | INTRAMUSCULAR | Status: DC | PRN

## 2015-03-30 MED ORDER — PREDNISONE 10 MG PO TABS: ORAL_TABLET | ORAL | Status: DC

## 2015-03-30 MED ORDER — SODIUM CHLORIDE 0.9 % IJ SOLN
10.0000 mL | Freq: Two times a day (BID) | INTRAMUSCULAR | Status: DC
  Administered 2015-03-30: 20 mL

## 2015-03-30 NOTE — Care Management (Signed)
Spoke with patient concerning Eliquis Rx. Patient given 30 day free trial card and would have $40 co pay subsequently.

## 2015-03-30 NOTE — Progress Notes (Signed)
Discharge instructions and prescriptions given, verbalized understanding, out in stable condition via w/c with staff. 

## 2015-06-04 ENCOUNTER — Encounter: Payer: Self-pay | Admitting: Cardiovascular Disease

## 2015-06-04 ENCOUNTER — Ambulatory Visit (INDEPENDENT_AMBULATORY_CARE_PROVIDER_SITE_OTHER): Payer: Medicare Other | Admitting: Cardiovascular Disease

## 2015-06-04 VITALS — BP 110/78 | HR 105 | Ht 62.0 in | Wt 181.0 lb

## 2015-06-04 DIAGNOSIS — R000 Tachycardia, unspecified: Secondary | ICD-10-CM | POA: Diagnosis not present

## 2015-06-04 DIAGNOSIS — Z7901 Long term (current) use of anticoagulants: Secondary | ICD-10-CM

## 2015-06-04 DIAGNOSIS — I10 Essential (primary) hypertension: Secondary | ICD-10-CM | POA: Diagnosis not present

## 2015-06-04 DIAGNOSIS — I4892 Unspecified atrial flutter: Secondary | ICD-10-CM | POA: Diagnosis not present

## 2015-06-04 DIAGNOSIS — Z5181 Encounter for therapeutic drug level monitoring: Secondary | ICD-10-CM

## 2015-06-04 DIAGNOSIS — I4891 Unspecified atrial fibrillation: Secondary | ICD-10-CM

## 2015-06-04 MED ORDER — RIVAROXABAN 20 MG PO TABS: 20.0000 mg | ORAL_TABLET | Freq: Every day | ORAL | Status: DC

## 2015-06-04 NOTE — Patient Instructions (Signed)
   Begin Xarelto 20mg  daily with evening meal - samples, free 30-day trial card, & printed scripts given today. Continue all other medications.   Please call office back with medication & doses. Your physician wants you to follow up in: 6 months.  You will receive a reminder letter in the mail one-two months in advance.  If you don't receive a letter, please call our office to schedule the follow up appointment

## 2015-06-04 NOTE — Progress Notes (Signed)
Patient ID: Eileen [name], female   DOB: 11/26/1945, 70 y.o.   MRN: [id number]      SUBJECTIVE: The patient presents for follow-up of atrial flutter and fibrillation. I evaluated her in the hospital in January 2017 when she had acute hypoxic respiratory failure due to pneumonia and a COPD exacerbation. Echocardiogram on 03/22/2015 showed normal left ventricular systolic function, EF [id number], mild mitral and mild to moderate tricuspid regurgitation, and mild to moderately elevated pulmonary pressures, 45 mmHg.  ECG today shows sinus tachycardia, heart rate [id number] bpm. She is uncertain about which medications she is taking, specifically with regards to AV nodal blocking agents. She said the insurance company would not cover Eliquis and so her PCP changed her to [place], but they will not cover this either.  Uses 2L oxygen. Denies chest pain, leg swelling, and palpitations.   Review of Systems: As per "subjective", otherwise negative.  Allergies  Allergen Reactions  . Codeine Other (See Comments)    "jittery"    Current Outpatient Prescriptions  Medication Sig Dispense Refill  . albuterol (PROVENTIL) (2.5 MG/3ML) 0.083% nebulizer solution Take 2.5 mg by nebulization every 6 (six) hours as needed for wheezing or shortness of breath.    . Ascorbic Acid (VITAMIN C PO) Take 1 tablet by mouth daily.    . Cholecalciferol (VITAMIN D PO) Take 1 tablet by mouth daily.     . furosemide (LASIX) 40 MG tablet Take 1 tablet by mouth daily as needed.  5  . OXYGEN Inhale 2 L into the lungs daily.    . pantoprazole (PROTONIX) 40 MG tablet Take 1 tablet (40 mg total) by mouth daily at 6 (six) AM. 30 tablet 1  . polyethylene glycol powder (GLYCOLAX/MIRALAX) powder Take 17 g by mouth daily as needed.  3  . predniSONE (DELTASONE) 5 MG tablet Take 5 mg by mouth every other day.  3  . PROAIR HFA 108 (90 BASE) MCG/ACT inhaler Inhale 2 puffs into the lungs every 6 (six) hours as needed for wheezing or shortness of  breath.     . SPIRIVA HANDIHALER 18 MCG inhalation capsule Place 18 mcg into inhaler and inhale daily as needed (for shortness of breath).     . SYMBICORT 160-4.5 MCG/ACT inhaler Inhale 2 puffs into the lungs daily as needed (for shortness of breath).     Marland Kitchen telmisartan-hydrochlorothiazide (MICARDIS HCT) 80-12.5 MG tablet Take 0.5 tablets by mouth daily.  1  . Vitamin D, Ergocalciferol, (DRISDOL) 50000 UNITS CAPS capsule Take 50,000 Units by mouth every 30 (thirty) days.    Alveda Reasons 20 MG TABS tablet Take 20 mg by mouth daily. Reported on 06/04/2015  0   No current facility-administered medications for this visit.    Past Medical History  Diagnosis Date  . Hypertension   . Asthma   . History of breast cancer     right breast  . COPD (chronic obstructive pulmonary disease) ([place])     Past Surgical History  Procedure Laterality Date  . Mastectomy  2011    right breast  . Cesarean section    . Hernia repair      RIH    Social History   Social History  . Marital Status: Single    Spouse Name: N/A  . Number of Children: N/A  . Years of Education: N/A   Occupational History  . Not on file.   Social History Main Topics  . Smoking status: Former Smoker -- 0.50 packs/day for 32 years  Types: Cigarettes    Start date: 12/12/1962    Quit date: 03/13/1994  . Smokeless tobacco: Never Used  . Alcohol Use: No  . Drug Use: No  . Sexual Activity: Not on file   Other Topics Concern  . Not on file   Social History Narrative     Filed Vitals:   06/04/15 1431  BP: 110/78  Pulse: 105  Height: 5\' 2"  (1.575 m)  Weight: 181 lb (82.101 kg)  SpO2: 93%    PHYSICAL EXAM General: NAD, using oxygen by nasal cannula HEENT: Normal. Neck: No JVD, no thyromegaly. Lungs: Diminished throughout, no rales or wheezes. CV: Nondisplaced PMI.  Tachycardic, regular rhythm, normal S1/S2, no S3/S4, no murmur. No pretibial or periankle edema.   Abdomen: Soft, nontender, obese.Marland Kitchen  Neurologic:  Alert and oriented.  Psych: Normal affect. Skin: Normal. Musculoskeletal: No gross deformities.  ECG: Most recent ECG reviewed.      ASSESSMENT AND PLAN: 1. Atrial flutter and fibrillation: Currently in sinus tachycardia and uncertain about what meds she's taking. Will need to determine that first prior to making adjustments. Will try and help her obtain Xarelto 20 mg daily.  2. Essential HTN: Controlled on Micardis-HCTZ. No changes.  Dispo: fu 6 months.  Kate Sable, M.D., F.A.C.C.

## 2015-06-04 NOTE — Addendum Note (Signed)
Addended by: Laurine Blazer on: 06/04/2015 03:23 PM   Modules accepted: Orders, Medications

## 2015-06-07 ENCOUNTER — Telehealth: Payer: Self-pay | Admitting: *Deleted

## 2015-06-07 NOTE — Telephone Encounter (Signed)
Restart Cardizem CD 240 mg daily.

## 2015-06-07 NOTE — Telephone Encounter (Signed)
Patient in office on 06/04/15 to see Dr. Bronson Ing.  She was asked to call with update on her medications.  Stated that she had been on Diltiazem 240 & Digoxin 139mcg, both given at hospital discharge.  Stated she ran out & not sure if she was supposed to continue on them.  Also, had been on Amiodarone while in the hospital.  Message fwd to provider for further advice.

## 2015-06-08 MED ORDER — DILTIAZEM HCL ER COATED BEADS 240 MG PO CP24: 240.0000 mg | ORAL_CAPSULE | Freq: Every day | ORAL | Status: DC

## 2015-06-08 NOTE — Telephone Encounter (Signed)
Patient notified.  Will send rx to [facility] now.

## 2015-06-08 NOTE — Telephone Encounter (Signed)
Left message to return call 

## 2015-06-10 ENCOUNTER — Telehealth: Payer: Self-pay | Admitting: Cardiovascular Disease

## 2015-06-10 NOTE — Telephone Encounter (Signed)
Eileen [name] has questions about her medications.

## 2015-06-10 NOTE — Telephone Encounter (Signed)
Returned call - had questions about the Diltiazem that Dr. Bronson Ing just added back.  Explained that is in addition to what she is already taking.  Beta blocker - helps with heart rate & BP.  Patient verbalized understanding.

## 2015-07-15 ENCOUNTER — Telehealth: Payer: Self-pay | Admitting: *Deleted

## 2015-07-15 NOTE — Telephone Encounter (Signed)
Prior authorization requested on Xarelto 20mg  tablet.  PA Case [id number] is Approved.

## 2015-07-15 NOTE — Telephone Encounter (Signed)
Approved through 03/12/2016.

## 2015-08-21 ENCOUNTER — Emergency Department (HOSPITAL_COMMUNITY): Payer: Medicare Other

## 2015-08-21 ENCOUNTER — Encounter (HOSPITAL_COMMUNITY): Payer: Self-pay

## 2015-08-21 ENCOUNTER — Emergency Department (HOSPITAL_COMMUNITY)
Admission: EM | Admit: 2015-08-21 | Discharge: 2015-08-21 | Disposition: A | Discharge status: Home / Self Care | Payer: Medicare Other | Attending: Emergency Medicine | Admitting: Emergency Medicine

## 2015-08-21 DIAGNOSIS — R0602 Shortness of breath: Secondary | ICD-10-CM | POA: Diagnosis present

## 2015-08-21 DIAGNOSIS — Z79899 Other long term (current) drug therapy: Secondary | ICD-10-CM | POA: Insufficient documentation

## 2015-08-21 DIAGNOSIS — J45909 Unspecified asthma, uncomplicated: Secondary | ICD-10-CM | POA: Insufficient documentation

## 2015-08-21 DIAGNOSIS — Z87891 Personal history of nicotine dependence: Secondary | ICD-10-CM | POA: Insufficient documentation

## 2015-08-21 DIAGNOSIS — Z853 Personal history of malignant neoplasm of breast: Secondary | ICD-10-CM | POA: Diagnosis not present

## 2015-08-21 DIAGNOSIS — J441 Chronic obstructive pulmonary disease with (acute) exacerbation: Secondary | ICD-10-CM | POA: Diagnosis not present

## 2015-08-21 DIAGNOSIS — I10 Essential (primary) hypertension: Secondary | ICD-10-CM | POA: Insufficient documentation

## 2015-08-21 MED ORDER — PREDNISONE 10 MG PO TABS
10.0000 mg | ORAL_TABLET | Freq: Once | ORAL | Status: AC
  Administered 2015-08-21: 10 mg via ORAL
  Filled 2015-08-21: qty 1

## 2015-08-21 MED ORDER — DOXYCYCLINE HYCLATE 100 MG PO CAPS: 100.0000 mg | ORAL_CAPSULE | Freq: Two times a day (BID) | ORAL | Status: DC

## 2015-08-21 MED ORDER — ALBUTEROL SULFATE (2.5 MG/3ML) 0.083% IN NEBU
5.0000 mg | INHALATION_SOLUTION | Freq: Once | RESPIRATORY_TRACT | Status: AC
  Administered 2015-08-21: 5 mg via RESPIRATORY_TRACT
  Filled 2015-08-21: qty 6

## 2015-08-21 MED ORDER — DOXYCYCLINE HYCLATE 100 MG PO TABS
100.0000 mg | ORAL_TABLET | Freq: Once | ORAL | Status: AC
  Administered 2015-08-21: 100 mg via ORAL
  Filled 2015-08-21: qty 1

## 2015-08-21 NOTE — Discharge Instructions (Signed)
Chest x-ray shows no pneumonia. Prescription for antibiotic. Increase prednisone to 5 mg daily until feeling better. Follow-up your primary care doctor

## 2015-08-21 NOTE — ED Notes (Signed)
Reports of increased shortness of breath that started early this morning. Patient is on 2 LPM of oxygen at home. Cough x1 week. Was seen at PCP for cough.

## 2015-08-21 NOTE — ED Provider Notes (Signed)
CSN: [id number]     Arrival date & time 08/21/15  1559 History   First MD Initiated Contact with Patient 08/21/15 1616     Chief Complaint  Patient presents with  . Shortness of Breath     (Consider location/radiation/quality/duration/timing/severity/associated sxs/prior Treatment) HPI.Marland KitchenMarland KitchenMarland KitchenIncreasing dyspnea since this morning. Patient has known COPD is a and is on home oxygen. She has had a productive cough with clear phlegm for 2 weeks. She currently takes prednisone 5 mg every other day. Former smoker. No fever, sweats, chills, rusty sputum. Severity is moderate. Nothing makes symptoms better or worse.  Past Medical History  Diagnosis Date  . Hypertension   . Asthma   . History of breast cancer     right breast  . COPD (chronic obstructive pulmonary disease) ([place])    Past Surgical History  Procedure Laterality Date  . Mastectomy  2011    right breast  . Cesarean section    . Hernia repair      RIH   Family History  Problem Relation Age of Onset  . Cancer Mother     lung  . Diabetes Brother    Social History  Substance Use Topics  . Smoking status: Former Smoker -- 0.50 packs/day for 32 years    Types: Cigarettes    Start date: 12/12/1962    Quit date: 03/13/1994  . Smokeless tobacco: Never Used  . Alcohol Use: No   OB History    Gravida Para Term Preterm AB TAB SAB Ectopic Multiple Living            5     Review of Systems  All other systems reviewed and are negative.     Allergies  Codeine  Home Medications   Prior to Admission medications   Medication Sig Start Date End Date Taking? Authorizing Provider  albuterol (PROVENTIL) (2.5 MG/3ML) 0.083% nebulizer solution Take 2.5 mg by nebulization every 6 (six) hours as needed for wheezing or shortness of breath.   Yes Historical Provider, MD  Ascorbic Acid (VITAMIN C PO) Take 1 tablet by mouth daily.   Yes Historical Provider, MD  Cholecalciferol (VITAMIN D PO) Take 1 tablet by mouth daily.    Yes  Historical Provider, MD  diltiazem (CARDIZEM CD) 240 MG 24 hr capsule Take 1 capsule (240 mg total) by mouth daily. Patient taking differently: Take 240 mg by mouth every evening.  06/08/15  Yes Herminio Commons, MD  furosemide (LASIX) 40 MG tablet Take 1 tablet by mouth daily as needed for fluid or edema.  04/15/15  Yes Historical Provider, MD  OXYGEN Inhale 2 L into the lungs daily. At night and daily as needed   Yes Historical Provider, MD  polyethylene glycol powder (GLYCOLAX/MIRALAX) powder Take 17 g by mouth daily as needed for mild constipation or moderate constipation.  04/13/15  Yes Historical Provider, MD  predniSONE (DELTASONE) 5 MG tablet Take 5 mg by mouth every other day. 04/19/15  Yes Historical Provider, MD  PROAIR HFA 108 (90 BASE) MCG/ACT inhaler Inhale 2 puffs into the lungs every 6 (six) hours as needed for wheezing or shortness of breath.  03/06/11  Yes Historical Provider, MD  rivaroxaban (XARELTO) 20 MG TABS tablet Take 1 tablet (20 mg total) by mouth daily with supper. 06/04/15  Yes Herminio Commons, MD  SYMBICORT 160-4.5 MCG/ACT inhaler Inhale 2 puffs into the lungs daily as needed (for shortness of breath).  12/14/10  Yes Historical Provider, MD  telmisartan-hydrochlorothiazide (MICARDIS HCT) 80-12.5  MG tablet Take 0.5 tablets by mouth daily. 03/18/15  Yes Historical Provider, MD  Vitamin D, Ergocalciferol, (DRISDOL) 50000 UNITS CAPS capsule Take 50,000 Units by mouth every 30 (thirty) days.   Yes Historical Provider, MD  doxycycline (VIBRAMYCIN) 100 MG capsule Take 1 capsule (100 mg total) by mouth 2 (two) times daily. 08/21/15   Nat Christen, MD   BP 106/74 mmHg  Pulse 104  Temp(Src) 97.9 F (36.6 C) (Oral)  Resp 18  Ht 5\' 2"  (1.575 m)  Wt 128 lb (58.06 kg)  BMI 23.41 kg/m2  SpO2 94% Physical Exam  Constitutional: She is oriented to person, place, and time. She appears well-developed and well-nourished.  Nasal oxygen in place. No acute distress.  HENT:  Head:  Normocephalic and atraumatic.  Eyes: Conjunctivae and EOM are normal. Pupils are equal, round, and reactive to light.  Neck: Normal range of motion. Neck supple.  Cardiovascular: Normal rate and regular rhythm.   Pulmonary/Chest: Effort normal.  Minimal expiratory wheeze  Abdominal: Soft. Bowel sounds are normal.  Musculoskeletal: Normal range of motion.  Neurological: She is alert and oriented to person, place, and time.  Skin: Skin is warm and dry.  Psychiatric: She has a normal mood and affect. Her behavior is normal.  Nursing note and vitals reviewed.   ED Course  Procedures (including critical care time) Labs Review Labs Reviewed - No data to display  Imaging Review Dg Chest 2 View  08/21/2015  CLINICAL DATA:  Shortness of breath EXAM: CHEST  2 VIEW COMPARISON:  03/24/2015 chest radiograph. FINDINGS: Stable cardiomediastinal silhouette with normal heart size. No pneumothorax. No pleural effusion. No pulmonary edema. Emphysema. Mild scarring versus atelectasis at the lung bases. No acute consolidative airspace disease. Hyperinflated lungs. IMPRESSION: 1. Emphysema and hyperinflated lungs, suggesting COPD. 2. Mild bibasilar scarring versus atelectasis. No acute consolidative airspace disease. Electronically Signed   By: Ilona Sorrel M.D.   On: 08/21/2015 18:16   I have personally reviewed and evaluated these images and lab results as part of my medical decision-making.   EKG Interpretation   Date/Time:  Saturday August 21 2015 16:15:07 EDT Ventricular Rate:  120 PR Interval:  126 QRS Duration: 84 QT Interval:  305 QTC Calculation: 431 R Axis:   39 Text Interpretation:  Sinus tachycardia Ventricular premature complex  Aberrant complex Probable left atrial enlargement Minimal ST depression,  inferior leads Confirmed by Giavonna Pflum  MD, Mallorie Norrod ([id number]) on 08/21/2015 5:33:41  PM      MDM   Final diagnoses:  COPD exacerbation ([place])    Patient is hemodynamically stable. Chest x-ray  shows no pneumonia. She feels better after a nebulizer treatment. Will increase prednisone to 5 mg daily and start doxycycline 100 mg twice a day.  She has primary care follow-up.    Nat Christen, MD 08/21/15 [phone]

## 2015-11-08 ENCOUNTER — Telehealth: Payer: Self-pay | Admitting: *Deleted

## 2015-11-08 DIAGNOSIS — K921 Melena: Secondary | ICD-10-CM

## 2015-11-08 NOTE — Telephone Encounter (Signed)
Please let patient know her bleeding will need to be worked up regardless of whether or not it resolves with stopping the blood thinner.  Xarelto does not cause blood in stool, it only uncovers a source of an area which was previously prone to bleeding.   She is at increased risk of stroke while off anticoagulation so she will need to touch base with the doctor that held this medicine to determine a plan for when she can safely resume. Would recommend she get a CBC and BMET, and GI eval ASAP to further evaluate (CBC to eval for any Hgb losses, BMET to make sure renal function is stable and does not require future Xarelto dose reduction). This can be done at her PCP's office if desired.  Dayna Dunn PA-C

## 2015-11-08 NOTE — Telephone Encounter (Signed)
Pt aware and requested lab work be done at Dr. Demetrio Lapping office, since he is going to let her know when to resume Xarelto. Will forward this message to Dr. Dellia Cloud office and place orders for GI referral.

## 2015-11-08 NOTE — Addendum Note (Signed)
Addended by: Julian Hy T on: 11/08/2015 02:35 PM   Modules accepted: Orders

## 2015-11-08 NOTE — Telephone Encounter (Signed)
Pt says she has been having blood in stool and called Dr. Eula Fried who suggested she hold xarelto for a few days and call back. Pt will let us know after she speaks with Dr. Eula Fried later in the week. Will forward to covering provider as FYI Dr. Bronson Ing out of office

## 2015-11-09 NOTE — Telephone Encounter (Signed)
Spoke with Dr. Eula Fried office pt is coming 9/7 for blood work they will forward [place] results

## 2015-11-16 ENCOUNTER — Encounter: Payer: Self-pay | Admitting: Gastroenterology

## 2015-11-22 ENCOUNTER — Telehealth: Payer: Self-pay | Admitting: Cardiovascular Disease

## 2015-11-22 NOTE — Telephone Encounter (Signed)
FYI Pt saw pcp today, they found no blood in stool and have done lab work today.She will f/u withGI if her pcp tells her to

## 2015-11-22 NOTE — Telephone Encounter (Signed)
Wants to know if she still needs to see specialist for blood in stool???

## 2015-12-01 ENCOUNTER — Encounter: Payer: Self-pay | Admitting: *Deleted

## 2015-12-02 ENCOUNTER — Ambulatory Visit (INDEPENDENT_AMBULATORY_CARE_PROVIDER_SITE_OTHER): Payer: Medicare Other | Admitting: Cardiovascular Disease

## 2015-12-02 ENCOUNTER — Encounter: Payer: Self-pay | Admitting: Cardiovascular Disease

## 2015-12-02 ENCOUNTER — Ambulatory Visit: Payer: Medicare Other | Admitting: Nurse Practitioner

## 2015-12-02 VITALS — BP 142/80 | HR 110 | Ht 62.0 in | Wt 138.0 lb

## 2015-12-02 DIAGNOSIS — Z5181 Encounter for therapeutic drug level monitoring: Secondary | ICD-10-CM

## 2015-12-02 DIAGNOSIS — R000 Tachycardia, unspecified: Secondary | ICD-10-CM | POA: Diagnosis not present

## 2015-12-02 DIAGNOSIS — I10 Essential (primary) hypertension: Secondary | ICD-10-CM | POA: Diagnosis not present

## 2015-12-02 DIAGNOSIS — I4891 Unspecified atrial fibrillation: Secondary | ICD-10-CM

## 2015-12-02 DIAGNOSIS — Z7901 Long term (current) use of anticoagulants: Secondary | ICD-10-CM

## 2015-12-02 MED ORDER — DILTIAZEM HCL ER COATED BEADS 300 MG PO CP24: 300.0000 mg | ORAL_CAPSULE | Freq: Every day | ORAL | 11 refills | Status: DC

## 2015-12-02 NOTE — Progress Notes (Signed)
SUBJECTIVE: The patient presents for follow-up of atrial flutter and fibrillation. Echocardiogram on 03/22/2015 showed normal left ventricular systolic function, EF [id number], mild mitral and mild to moderate tricuspid regurgitation, and mild to moderately elevated pulmonary pressures, 45 mmHg.  Had been having blood in stool and stopped Xarelto but restarted.  Denies palps and chest pain. Uses 2L oxygen.   Review of Systems: As per "subjective", otherwise negative.  Allergies  Allergen Reactions  . Codeine Other (See Comments)    "jittery"    Current Outpatient Prescriptions  Medication Sig Dispense Refill  . albuterol (PROVENTIL) (2.5 MG/3ML) 0.083% nebulizer solution Take 2.5 mg by nebulization every 6 (six) hours as needed for wheezing or shortness of breath.    . Ascorbic Acid (VITAMIN C PO) Take 1 tablet by mouth daily.    . Cholecalciferol (VITAMIN D PO) Take 1 tablet by mouth daily.     Marland Kitchen diltiazem (CARDIZEM CD) 240 MG 24 hr capsule Take 1 capsule (240 mg total) by mouth daily. (Patient taking differently: Take 240 mg by mouth every evening. ) 30 capsule 6  . furosemide (LASIX) 40 MG tablet Take 1 tablet by mouth daily as needed for fluid or edema.   5  . OXYGEN Inhale 2 L into the lungs daily. At night and daily as needed    . polyethylene glycol powder (GLYCOLAX/MIRALAX) powder Take 17 g by mouth daily as needed for mild constipation or moderate constipation.   3  . predniSONE (DELTASONE) 5 MG tablet Take 5 mg by mouth every other day.  3  . PROAIR HFA 108 (90 BASE) MCG/ACT inhaler Inhale 2 puffs into the lungs every 6 (six) hours as needed for wheezing or shortness of breath.     . rivaroxaban (XARELTO) 20 MG TABS tablet Take 1 tablet (20 mg total) by mouth daily with supper. 20 tablet 0  . SYMBICORT 160-4.5 MCG/ACT inhaler Inhale 2 puffs into the lungs daily as needed (for shortness of breath).     Marland Kitchen telmisartan-hydrochlorothiazide (MICARDIS HCT) 80-12.5 MG tablet Take  0.5 tablets by mouth daily.  1  . Vitamin D, Ergocalciferol, (DRISDOL) 50000 UNITS CAPS capsule Take 50,000 Units by mouth every 30 (thirty) days.     No current facility-administered medications for this visit.     Past Medical History:  Diagnosis Date  . Asthma   . COPD (chronic obstructive pulmonary disease) ([place])   . History of breast cancer    right breast  . Hypertension     Past Surgical History:  Procedure Laterality Date  . CESAREAN SECTION    . HERNIA REPAIR     RIH  . MASTECTOMY  2011   right breast    Social History   Social History  . Marital status: Single    Spouse name: N/A  . Number of children: N/A  . Years of education: N/A   Occupational History  . Not on file.   Social History Main Topics  . Smoking status: Former Smoker    Packs/day: 0.50    Years: 32.00    Types: Cigarettes    Start date: 12/12/1962    Quit date: 03/13/1994  . Smokeless tobacco: Never Used  . Alcohol use No  . Drug use: No  . Sexual activity: Not on file   Other Topics Concern  . Not on file   Social History Narrative  . No narrative on file     Vitals:   12/02/15 1510  BP: Marland Kitchen)  142/80  Pulse: (!) 110  SpO2: (!) 89%  Weight: 138 lb (62.6 kg)  Height: 5\' 2"  (1.575 m)    PHYSICAL EXAM General: NAD, using oxygen by nasal cannula HEENT: Normal. Neck: No JVD, no thyromegaly. Lungs: Diminished throughout, no rales or wheezes. CV: Nondisplaced PMI.  Tachycardic, regular rhythm, normal S1/S2, no S3/S4, no murmur. No pretibial or periankle edema.   Abdomen: Soft, nontender, obese.Marland Kitchen  Neurologic: Alert and oriented.  Psych: Normal affect. Skin: Normal. Musculoskeletal: No gross deformities.    ECG: Most recent ECG reviewed.      ASSESSMENT AND PLAN: 1. Atrial flutter and fibrillation/tachycardia: Continue Xarelto 20 mg daily. Increase diltiazem to 300 mg daily.  2. Essential HTN: Mildly elevated. Monitor with increase of diltiazem.   Dispo: fu 1  year.   Kate Sable, M.D., F.A.C.C.

## 2015-12-02 NOTE — Patient Instructions (Addendum)
Medication Instructions:   Increase Diltiazem CD to 300mg  daily.  Continue all other medications.    Labwork: none  Testing/Procedures: none  Follow-Up: Your physician wants you to follow up in:  1 year.  You will receive a reminder letter in the mail one-two months in advance.  If you don't receive a letter, please call our office to schedule the follow up appointment   Any Other Special Instructions Will Be Listed Below (If Applicable).  If you need a refill on your cardiac medications before your next appointment, please call your pharmacy.

## 2015-12-06 ENCOUNTER — Telehealth: Payer: Self-pay | Admitting: Cardiovascular Disease

## 2015-12-06 NOTE — Telephone Encounter (Signed)
Patient had question concerning her Diltiazem CD 300mg  daily - wanted to know what time of day to take it.  Advised her to take in the morning & to be consistent since it is a 24 hour pill.  Patient verbalized understanding.

## 2015-12-06 NOTE — Telephone Encounter (Signed)
Patient has question about when she needs to take her medication

## 2015-12-22 ENCOUNTER — Telehealth: Payer: Self-pay | Admitting: Cardiovascular Disease

## 2015-12-22 NOTE — Telephone Encounter (Signed)
Eileen [name] called stating that she can not take the  Diltiazem CD 300mg  daily . States it is causing her stomach to cramp, headaches, fatigue.

## 2015-12-22 NOTE — Telephone Encounter (Signed)
Can go back to 240 mg. Add metoprolol 25 mg BID.

## 2015-12-23 MED ORDER — DILTIAZEM HCL ER COATED BEADS 240 MG PO CP24: 240.0000 mg | ORAL_CAPSULE | Freq: Every day | ORAL | 6 refills | Status: DC

## 2015-12-23 MED ORDER — METOPROLOL TARTRATE 25 MG PO TABS: 25.0000 mg | ORAL_TABLET | Freq: Two times a day (BID) | ORAL | 6 refills | Status: DC

## 2015-12-23 NOTE — Telephone Encounter (Signed)
Patient notified and verbalized understanding.  Will send new prescriptions to [facility] now.

## 2016-01-24 ENCOUNTER — Telehealth: Payer: Self-pay | Admitting: *Deleted

## 2016-01-24 NOTE — Telephone Encounter (Signed)
Can't tolerate the Metoprolol.  Feels like it drains her, no energy .  She even tried to cut in half, but still makes her feel bad.

## 2016-01-24 NOTE — Telephone Encounter (Signed)
Heart rate was high at last office visit. She could try going back up on cardizem 300mg  daily like he ordered initially and stop metoprolol. Should have nurse check Heart rate in 1 week

## 2016-02-28 ENCOUNTER — Telehealth: Payer: Self-pay | Admitting: Cardiovascular Disease

## 2016-02-28 MED ORDER — RIVAROXABAN 20 MG PO TABS: 20.0000 mg | ORAL_TABLET | Freq: Every day | ORAL | 0 refills | Status: DC

## 2016-02-28 NOTE — Telephone Encounter (Signed)
rivaroxaban (XARELTO) 20 MG TABS tablet   Would like samples

## 2016-02-28 NOTE — Telephone Encounter (Signed)
Patient notified.  Samples ready for pick up.

## 2016-04-28 ENCOUNTER — Other Ambulatory Visit: Payer: Self-pay | Admitting: Cardiovascular Disease

## 2016-05-23 DIAGNOSIS — J441 Chronic obstructive pulmonary disease with (acute) exacerbation: Secondary | ICD-10-CM | POA: Diagnosis present

## 2016-05-23 DIAGNOSIS — E559 Vitamin D deficiency, unspecified: Secondary | ICD-10-CM | POA: Insufficient documentation

## 2016-05-24 DIAGNOSIS — E559 Vitamin D deficiency, unspecified: Secondary | ICD-10-CM | POA: Diagnosis not present

## 2016-05-24 DIAGNOSIS — I480 Paroxysmal atrial fibrillation: Secondary | ICD-10-CM | POA: Diagnosis not present

## 2016-05-24 DIAGNOSIS — J441 Chronic obstructive pulmonary disease with (acute) exacerbation: Secondary | ICD-10-CM | POA: Diagnosis not present

## 2016-05-24 DIAGNOSIS — I10 Essential (primary) hypertension: Secondary | ICD-10-CM | POA: Diagnosis not present

## 2016-06-08 DIAGNOSIS — I480 Paroxysmal atrial fibrillation: Secondary | ICD-10-CM | POA: Diagnosis not present

## 2016-06-08 DIAGNOSIS — I10 Essential (primary) hypertension: Secondary | ICD-10-CM | POA: Diagnosis not present

## 2016-06-08 DIAGNOSIS — J441 Chronic obstructive pulmonary disease with (acute) exacerbation: Secondary | ICD-10-CM | POA: Diagnosis not present

## 2016-06-08 DIAGNOSIS — E559 Vitamin D deficiency, unspecified: Secondary | ICD-10-CM | POA: Diagnosis not present

## 2016-06-10 DIAGNOSIS — R0902 Hypoxemia: Secondary | ICD-10-CM | POA: Diagnosis not present

## 2016-06-10 DIAGNOSIS — J449 Chronic obstructive pulmonary disease, unspecified: Secondary | ICD-10-CM | POA: Diagnosis not present

## 2016-07-10 DIAGNOSIS — J449 Chronic obstructive pulmonary disease, unspecified: Secondary | ICD-10-CM | POA: Diagnosis not present

## 2016-07-10 DIAGNOSIS — R0902 Hypoxemia: Secondary | ICD-10-CM | POA: Diagnosis not present

## 2016-07-28 ENCOUNTER — Other Ambulatory Visit: Payer: Self-pay | Admitting: Cardiovascular Disease

## 2016-08-10 DIAGNOSIS — R0902 Hypoxemia: Secondary | ICD-10-CM | POA: Diagnosis not present

## 2016-08-10 DIAGNOSIS — J449 Chronic obstructive pulmonary disease, unspecified: Secondary | ICD-10-CM | POA: Diagnosis not present

## 2016-09-06 DIAGNOSIS — Z79899 Other long term (current) drug therapy: Secondary | ICD-10-CM | POA: Diagnosis not present

## 2016-09-06 DIAGNOSIS — Z853 Personal history of malignant neoplasm of breast: Secondary | ICD-10-CM | POA: Diagnosis not present

## 2016-09-06 DIAGNOSIS — Z9981 Dependence on supplemental oxygen: Secondary | ICD-10-CM | POA: Diagnosis not present

## 2016-09-06 DIAGNOSIS — I10 Essential (primary) hypertension: Secondary | ICD-10-CM | POA: Diagnosis not present

## 2016-09-06 DIAGNOSIS — Z87891 Personal history of nicotine dependence: Secondary | ICD-10-CM | POA: Diagnosis not present

## 2016-09-06 DIAGNOSIS — J441 Chronic obstructive pulmonary disease with (acute) exacerbation: Secondary | ICD-10-CM | POA: Diagnosis not present

## 2016-09-06 DIAGNOSIS — J439 Emphysema, unspecified: Secondary | ICD-10-CM | POA: Diagnosis not present

## 2016-09-06 DIAGNOSIS — Z7901 Long term (current) use of anticoagulants: Secondary | ICD-10-CM | POA: Diagnosis not present

## 2016-09-06 DIAGNOSIS — R0602 Shortness of breath: Secondary | ICD-10-CM | POA: Diagnosis not present

## 2016-09-09 DIAGNOSIS — J449 Chronic obstructive pulmonary disease, unspecified: Secondary | ICD-10-CM | POA: Diagnosis not present

## 2016-09-09 DIAGNOSIS — R0902 Hypoxemia: Secondary | ICD-10-CM | POA: Diagnosis not present

## 2016-09-18 DIAGNOSIS — Z1231 Encounter for screening mammogram for malignant neoplasm of breast: Secondary | ICD-10-CM | POA: Diagnosis not present

## 2016-09-22 ENCOUNTER — Emergency Department (HOSPITAL_COMMUNITY): Payer: Medicare Other

## 2016-09-22 ENCOUNTER — Encounter (HOSPITAL_COMMUNITY): Payer: Self-pay | Admitting: Cardiology

## 2016-09-22 ENCOUNTER — Inpatient Hospital Stay (HOSPITAL_COMMUNITY)
Admission: EM | Admit: 2016-09-22 | Discharge: 2016-09-29 | DRG: 190 | Disposition: A | Discharge status: Home Health | Payer: Medicare Other | Attending: Internal Medicine | Admitting: Internal Medicine

## 2016-09-22 DIAGNOSIS — Z5181 Encounter for therapeutic drug level monitoring: Secondary | ICD-10-CM

## 2016-09-22 DIAGNOSIS — Z87891 Personal history of nicotine dependence: Secondary | ICD-10-CM | POA: Diagnosis not present

## 2016-09-22 DIAGNOSIS — J9621 Acute and chronic respiratory failure with hypoxia: Secondary | ICD-10-CM | POA: Diagnosis not present

## 2016-09-22 DIAGNOSIS — Z885 Allergy status to narcotic agent status: Secondary | ICD-10-CM | POA: Diagnosis not present

## 2016-09-22 DIAGNOSIS — Z7901 Long term (current) use of anticoagulants: Secondary | ICD-10-CM | POA: Diagnosis not present

## 2016-09-22 DIAGNOSIS — Z7952 Long term (current) use of systemic steroids: Secondary | ICD-10-CM | POA: Diagnosis not present

## 2016-09-22 DIAGNOSIS — J441 Chronic obstructive pulmonary disease with (acute) exacerbation: Principal | ICD-10-CM | POA: Diagnosis present

## 2016-09-22 DIAGNOSIS — I482 Chronic atrial fibrillation: Secondary | ICD-10-CM | POA: Diagnosis not present

## 2016-09-22 DIAGNOSIS — E0965 Drug or chemical induced diabetes mellitus with hyperglycemia: Secondary | ICD-10-CM | POA: Diagnosis not present

## 2016-09-22 DIAGNOSIS — Z9981 Dependence on supplemental oxygen: Secondary | ICD-10-CM | POA: Diagnosis not present

## 2016-09-22 DIAGNOSIS — K649 Unspecified hemorrhoids: Secondary | ICD-10-CM | POA: Diagnosis present

## 2016-09-22 DIAGNOSIS — Z809 Family history of malignant neoplasm, unspecified: Secondary | ICD-10-CM | POA: Diagnosis not present

## 2016-09-22 DIAGNOSIS — R0602 Shortness of breath: Secondary | ICD-10-CM | POA: Diagnosis not present

## 2016-09-22 DIAGNOSIS — Z853 Personal history of malignant neoplasm of breast: Secondary | ICD-10-CM

## 2016-09-22 DIAGNOSIS — Z9010 Acquired absence of unspecified breast and nipple: Secondary | ICD-10-CM

## 2016-09-22 DIAGNOSIS — I4892 Unspecified atrial flutter: Secondary | ICD-10-CM | POA: Diagnosis present

## 2016-09-22 DIAGNOSIS — J9622 Acute and chronic respiratory failure with hypercapnia: Secondary | ICD-10-CM | POA: Diagnosis not present

## 2016-09-22 DIAGNOSIS — E669 Obesity, unspecified: Secondary | ICD-10-CM | POA: Diagnosis present

## 2016-09-22 DIAGNOSIS — I4891 Unspecified atrial fibrillation: Secondary | ICD-10-CM | POA: Diagnosis not present

## 2016-09-22 DIAGNOSIS — J9620 Acute and chronic respiratory failure, unspecified whether with hypoxia or hypercapnia: Secondary | ICD-10-CM | POA: Diagnosis present

## 2016-09-22 DIAGNOSIS — Z833 Family history of diabetes mellitus: Secondary | ICD-10-CM

## 2016-09-22 DIAGNOSIS — R739 Hyperglycemia, unspecified: Secondary | ICD-10-CM | POA: Diagnosis not present

## 2016-09-22 DIAGNOSIS — I10 Essential (primary) hypertension: Secondary | ICD-10-CM | POA: Diagnosis present

## 2016-09-22 DIAGNOSIS — Z6833 Body mass index (BMI) 33.0-33.9, adult: Secondary | ICD-10-CM

## 2016-09-22 DIAGNOSIS — E119 Type 2 diabetes mellitus without complications: Secondary | ICD-10-CM | POA: Diagnosis not present

## 2016-09-22 LAB — COMPREHENSIVE METABOLIC PANEL
ALT: 21 U/L (ref 14–54)
AST: 22 U/L (ref 15–41)
Albumin: 4.5 g/dL (ref 3.5–5.0)
Alkaline Phosphatase: 69 U/L (ref 38–126)
Anion gap: 9 (ref 5–15)
BILIRUBIN TOTAL: 0.7 mg/dL (ref 0.3–1.2)
BUN: 17 mg/dL (ref 6–20)
CHLORIDE: 95 mmol/L — AB (ref 101–111)
CO2: 34 mmol/L — ABNORMAL HIGH (ref 22–32)
CREATININE: 0.87 mg/dL (ref 0.44–1.00)
Calcium: 10.1 mg/dL (ref 8.9–10.3)
GFR calc Af Amer: >60 mL/min (ref >60)
Glucose, Bld: 194 mg/dL — ABNORMAL HIGH (ref 65–99)
Potassium: 4.6 mmol/L (ref 3.5–5.1)
Sodium: 138 mmol/L (ref 135–145)
Total Protein: 7.5 g/dL (ref 6.5–8.1)

## 2016-09-22 LAB — CBC WITH DIFFERENTIAL/PLATELET
BASOS ABS: 0 10*3/uL (ref 0.0–0.1)
Basophils Relative: 0 %
Eosinophils Absolute: 0 10*3/uL (ref 0.0–0.7)
Eosinophils Relative: 0 %
HEMATOCRIT: 46.4 % — AB (ref 36.0–46.0)
Hemoglobin: 14.7 g/dL (ref 12.0–15.0)
LYMPHS PCT: 12 %
Lymphs Abs: 1.5 10*3/uL (ref 0.7–4.0)
MCH: 30.5 pg (ref 26.0–34.0)
MCHC: 31.7 g/dL (ref 30.0–36.0)
MCV: 96.3 fL (ref 78.0–100.0)
Monocytes Absolute: 0.3 10*3/uL (ref 0.1–1.0)
Monocytes Relative: 2 %
NEUTROS ABS: 11.4 10*3/uL — AB (ref 1.7–7.7)
Neutrophils Relative %: 86 %
PLATELETS: 249 10*3/uL (ref 150–400)
RBC: 4.82 MIL/uL (ref 3.87–5.11)
RDW: 14.1 % (ref 11.5–15.5)
WBC: 13.3 10*3/uL — AB (ref 4.0–10.5)

## 2016-09-22 LAB — TROPONIN I

## 2016-09-22 LAB — BRAIN NATRIURETIC PEPTIDE: B Natriuretic Peptide: 87 pg/mL (ref 0.0–100.0)

## 2016-09-22 MED ORDER — PANTOPRAZOLE SODIUM 40 MG PO TBEC
40.0000 mg | DELAYED_RELEASE_TABLET | Freq: Every day | ORAL | Status: DC
  Administered 2016-09-23 – 2016-09-29 (×7): 40 mg via ORAL
  Filled 2016-09-22 (×7): qty 1

## 2016-09-22 MED ORDER — DEXTROSE 5 % IV SOLN
500.0000 mg | INTRAVENOUS | Status: DC
  Administered 2016-09-23 – 2016-09-24 (×3): 500 mg via INTRAVENOUS
  Filled 2016-09-22 (×5): qty 500

## 2016-09-22 MED ORDER — MOMETASONE FURO-FORMOTEROL FUM 200-5 MCG/ACT IN AERO
2.0000 | INHALATION_SPRAY | Freq: Two times a day (BID) | RESPIRATORY_TRACT | Status: DC
  Administered 2016-09-23 – 2016-09-29 (×13): 2 via RESPIRATORY_TRACT
  Filled 2016-09-22: qty 8.8

## 2016-09-22 MED ORDER — VITAMIN D (ERGOCALCIFEROL) 1.25 MG (50000 UNIT) PO CAPS: 50000.0000 [IU] | ORAL_CAPSULE | ORAL | Status: DC

## 2016-09-22 MED ORDER — VITAMIN C 500 MG PO TABS
1000.0000 mg | ORAL_TABLET | Freq: Every day | ORAL | Status: DC
  Administered 2016-09-23 – 2016-09-29 (×7): 1000 mg via ORAL
  Filled 2016-09-22 (×7): qty 2

## 2016-09-22 MED ORDER — RIVAROXABAN 20 MG PO TABS
20.0000 mg | ORAL_TABLET | Freq: Every day | ORAL | Status: DC
  Administered 2016-09-23 – 2016-09-28 (×6): 20 mg via ORAL
  Filled 2016-09-22 (×6): qty 1

## 2016-09-22 MED ORDER — METHYLPREDNISOLONE SODIUM SUCC 125 MG IJ SOLR
125.0000 mg | Freq: Once | INTRAMUSCULAR | Status: AC
  Administered 2016-09-22: 125 mg via INTRAVENOUS
  Filled 2016-09-22: qty 2

## 2016-09-22 MED ORDER — ALBUTEROL SULFATE (2.5 MG/3ML) 0.083% IN NEBU: 2.5000 mg | INHALATION_SOLUTION | RESPIRATORY_TRACT | Status: DC | PRN

## 2016-09-22 MED ORDER — HYDROCHLOROTHIAZIDE 10 MG/ML ORAL SUSPENSION
6.2500 mg | Freq: Every day | ORAL | Status: DC
  Administered 2016-09-23: 6.25 mg via ORAL
  Filled 2016-09-22 (×3): qty 1.25

## 2016-09-22 MED ORDER — IRBESARTAN 150 MG PO TABS
150.0000 mg | ORAL_TABLET | Freq: Every day | ORAL | Status: DC
  Administered 2016-09-23: 150 mg via ORAL
  Filled 2016-09-22 (×2): qty 1

## 2016-09-22 MED ORDER — METOPROLOL TARTRATE 25 MG PO TABS
25.0000 mg | ORAL_TABLET | Freq: Two times a day (BID) | ORAL | Status: DC
  Administered 2016-09-23 – 2016-09-24 (×3): 25 mg via ORAL
  Filled 2016-09-22 (×3): qty 1

## 2016-09-22 MED ORDER — ACETAMINOPHEN 650 MG RE SUPP: 650.0000 mg | Freq: Four times a day (QID) | RECTAL | Status: DC | PRN

## 2016-09-22 MED ORDER — TELMISARTAN-HCTZ 80-12.5 MG PO TABS: 0.5000 | ORAL_TABLET | Freq: Every day | ORAL | Status: DC

## 2016-09-22 MED ORDER — PREDNISONE 20 MG PO TABS
20.0000 mg | ORAL_TABLET | Freq: Every day | ORAL | Status: DC
Start: 2016-09-28 — End: 2016-09-23

## 2016-09-22 MED ORDER — DILTIAZEM HCL ER COATED BEADS 240 MG PO CP24
240.0000 mg | ORAL_CAPSULE | Freq: Every day | ORAL | Status: DC
  Administered 2016-09-23 – 2016-09-29 (×7): 240 mg via ORAL
  Filled 2016-09-22 (×7): qty 1

## 2016-09-22 MED ORDER — IPRATROPIUM-ALBUTEROL 0.5-2.5 (3) MG/3ML IN SOLN
3.0000 mL | Freq: Once | RESPIRATORY_TRACT | Status: AC
  Administered 2016-09-22: 3 mL via RESPIRATORY_TRACT
  Filled 2016-09-22: qty 3

## 2016-09-22 MED ORDER — POLYETHYLENE GLYCOL 3350 17 G PO PACK: 17.0000 g | PACK | Freq: Every day | ORAL | Status: DC | PRN

## 2016-09-22 MED ORDER — ALBUTEROL SULFATE (2.5 MG/3ML) 0.083% IN NEBU
2.5000 mg | INHALATION_SOLUTION | Freq: Four times a day (QID) | RESPIRATORY_TRACT | Status: DC
  Administered 2016-09-23 (×3): 2.5 mg via RESPIRATORY_TRACT
  Filled 2016-09-22 (×3): qty 3

## 2016-09-22 MED ORDER — ACETAMINOPHEN 325 MG PO TABS
650.0000 mg | ORAL_TABLET | Freq: Four times a day (QID) | ORAL | Status: DC | PRN
  Administered 2016-09-23 – 2016-09-28 (×3): 650 mg via ORAL
  Filled 2016-09-22 (×4): qty 2

## 2016-09-22 MED ORDER — FUROSEMIDE 40 MG PO TABS: 40.0000 mg | ORAL_TABLET | Freq: Every day | ORAL | Status: DC | PRN

## 2016-09-22 MED ORDER — ALBUTEROL SULFATE (2.5 MG/3ML) 0.083% IN NEBU
2.5000 mg | INHALATION_SOLUTION | Freq: Once | RESPIRATORY_TRACT | Status: AC
  Administered 2016-09-22: 2.5 mg via RESPIRATORY_TRACT
  Filled 2016-09-22: qty 3

## 2016-09-22 NOTE — ED Provider Notes (Signed)
[facility] DEPT Provider Note   CSN: [id number] Arrival date & time: 09/22/16  1809     History   Chief Complaint Chief Complaint  Patient presents with  . Shortness of Breath    HPI Eileen [name] is a 71 y.o. female.  Patient complains of shortness of breath and wheezing. Patient usually has 2 L of oxygen but she still short of breath   The history is provided by the patient. No language interpreter was used.  Shortness of Breath  This is a recurrent problem. The problem occurs continuously.The current episode started 3 to 5 hours ago. The problem has not changed since onset.Associated symptoms include wheezing. Pertinent negatives include no fever, no headaches, no cough, no chest pain, no abdominal pain and no rash. It is unknown what precipitated the problem. She has tried ipratropium inhalers for the symptoms. The treatment provided mild relief. She has had prior hospitalizations. She has had prior ED visits.    Past Medical History:  Diagnosis Date  . Asthma   . COPD (chronic obstructive pulmonary disease) ([place])   . History of breast cancer    right breast  . Hypertension     Patient Active Problem List   Diagnosis Date Noted  . Acute on chronic respiratory failure ([place]) 03/24/2015  . Metabolic encephalopathy 67/67/2094  . HCAP (healthcare-associated pneumonia) 03/24/2015  . Essential hypertension   . Anticoagulation management encounter   . New onset atrial flutter ([place]) 03/21/2015  . Obesity 03/21/2015  . Atrial flutter with rapid ventricular response ([place]) 03/21/2015  . COPD (chronic obstructive pulmonary disease) ([place]) 12/20/2014  . COPD exacerbation ([place]) 12/20/2014  . DCIS (ductal carcinoma in situ) of breast, right, 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, Arimadex 03/10/2011    Past Surgical History:  Procedure Laterality Date  . CESAREAN SECTION    . HERNIA REPAIR     RIH  . MASTECTOMY  2011   right breast    OB History    Gravida Para Term  Preterm AB Living             5   SAB TAB Ectopic Multiple Live Births                   Home Medications    Prior to Admission medications   Medication Sig Start Date End Date Taking? Authorizing Provider  albuterol (PROVENTIL) (2.5 MG/3ML) 0.083% nebulizer solution Take 2.5 mg by nebulization every 6 (six) hours as needed for wheezing or shortness of breath.    [provider]  Ascorbic Acid (VITAMIN C PO) Take 1 tablet by mouth daily.    [provider]  Cholecalciferol (VITAMIN D PO) Take 1 tablet by mouth daily.     [provider]  diltiazem (CARDIZEM CD) 240 MG 24 hr capsule TAKE ONE CAPSULE BY MOUTH DAILY. 07/28/16   Herminio Commons, MD  furosemide (LASIX) 40 MG tablet Take 1 tablet by mouth daily as needed for fluid or edema.  04/15/15   [provider]  metoprolol tartrate (LOPRESSOR) 25 MG tablet Take 1 tablet (25 mg total) by mouth 2 (two) times daily. 12/23/15   Herminio Commons, MD  OXYGEN Inhale 2 L into the lungs daily. At night and daily as needed    [provider]  polyethylene glycol powder (GLYCOLAX/MIRALAX) powder Take 17 g by mouth daily as needed for mild constipation or moderate constipation.  04/13/15   [provider]  predniSONE (DELTASONE) 5 MG tablet Take  5 mg by mouth every other day. 04/19/15   [provider]  PROAIR HFA 108 (90 BASE) MCG/ACT inhaler Inhale 2 puffs into the lungs every 6 (six) hours as needed for wheezing or shortness of breath.  03/06/11   [provider]  SYMBICORT 160-4.5 MCG/ACT inhaler Inhale 2 puffs into the lungs daily as needed (for shortness of breath).  12/14/10   [provider]  telmisartan-hydrochlorothiazide (MICARDIS HCT) 80-12.5 MG tablet Take 0.5 tablets by mouth daily. 03/18/15   [provider]  Vitamin D, Ergocalciferol, (DRISDOL) 50000 UNITS CAPS capsule Take 50,000 Units by mouth every 30 (thirty) days.    [provider]    XARELTO 20 MG TABS tablet TAKE ONE TABLET BY MOUTH DAILY WITH SUPPER. 04/28/16   Herminio Commons, MD    Family History Family History  Problem Relation Age of Onset  . Cancer Mother        lung  . Diabetes Brother     Social History Social History  Substance Use Topics  . Smoking status: Former Smoker    Packs/day: 0.50    Years: 32.00    Types: Cigarettes    Start date: 12/12/1962    Quit date: 03/13/1994  . Smokeless tobacco: Never Used  . Alcohol use No     Allergies   Codeine   Review of Systems Review of Systems  Constitutional: Negative for appetite change, fatigue and fever.  HENT: Negative for congestion, ear discharge and sinus pressure.   Eyes: Negative for discharge.  Respiratory: Positive for shortness of breath and wheezing. Negative for cough.   Cardiovascular: Negative for chest pain.  Gastrointestinal: Negative for abdominal pain and diarrhea.  Genitourinary: Negative for frequency and hematuria.  Musculoskeletal: Negative for back pain.  Skin: Negative for rash.  Neurological: Negative for seizures and headaches.  Psychiatric/Behavioral: Negative for hallucinations.     Physical Exam Updated Vital Signs BP 130/86   Pulse 89   Temp 98 F (36.7 C)   Resp 17   SpO2 96%   Physical Exam  Constitutional: She is oriented to person, place, and time. She appears well-developed.  HENT:  Head: Normocephalic.  Eyes: Conjunctivae and EOM are normal. No scleral icterus.  Neck: Neck supple. No thyromegaly present.  Cardiovascular: Normal rate and regular rhythm.  Exam reveals no gallop and no friction rub.   No murmur heard. Pulmonary/Chest: No stridor. She has wheezes. She has no rales. She exhibits no tenderness.  Abdominal: She exhibits no distension. There is no tenderness. There is no rebound.  Musculoskeletal: Normal range of motion. She exhibits no edema.  Lymphadenopathy:    She has no cervical adenopathy.  Neurological: She is oriented  to person, place, and time. She exhibits normal muscle tone. Coordination normal.  Skin: No rash noted. No erythema.  Psychiatric: She has a normal mood and affect. Her behavior is normal.     ED Treatments / Results  Labs (all labs ordered are listed, but only abnormal results are displayed) Labs Reviewed  CBC WITH DIFFERENTIAL/PLATELET - Abnormal; Notable for the following:       Result Value   WBC 13.3 (*)    HCT 46.4 (*)    Neutro Abs 11.4 (*)    All other components within normal limits  COMPREHENSIVE METABOLIC PANEL - Abnormal; Notable for the following:    Chloride 95 (*)    CO2 34 (*)    Glucose, Bld 194 (*)    All other components within  normal limits  TROPONIN I  BRAIN NATRIURETIC PEPTIDE    EKG  EKG Interpretation None       Radiology Dg Chest Portable 1 View  Result Date: 09/22/2016 CLINICAL DATA:  Shortness of breath. EXAM: PORTABLE CHEST 1 VIEW COMPARISON:  CT of the chest 09/06/2016. FINDINGS: Heart size is normal. Pulmonary arterial enlargement is again noted. Mild chronic interstitial coarsening is present. There is no edema or effusion. No focal airspace disease is present. Chronic blunting of the left CP angle is related to a diaphragmatic hernia. IMPRESSION: 1. No acute cardiopulmonary disease or significant interval change. 2. Pulmonary arterial hypertension. Electronically Signed   By: San Morelle M.D.   On: 09/22/2016 19:00    Procedures Procedures (including critical care time)  Medications Ordered in ED Medications  methylPREDNISolone sodium succinate (SOLU-MEDROL) 125 mg/2 mL injection 125 mg (125 mg Intravenous Given 09/22/16 1833)  ipratropium-albuterol (DUONEB) 0.5-2.5 (3) MG/3ML nebulizer solution 3 mL (3 mLs Nebulization Given 09/22/16 1835)  albuterol (PROVENTIL) (2.5 MG/3ML) 0.083% nebulizer solution 2.5 mg (2.5 mg Nebulization Given 09/22/16 1835)     Initial Impression / Assessment and Plan / ED Course  I have reviewed the  triage vital signs and the nursing notes.  Pertinent labs & imaging results that were available during my care of the patient were reviewed by me and considered in my medical decision making (see chart for details).   patient with COPD exacerbation. She will be admitted to the hospital for further treatment   Final Clinical Impressions(s) / ED Diagnoses   Final diagnoses:  COPD exacerbation ([place])    New Prescriptions New Prescriptions   No medications on file     Milton Ferguson, MD 09/22/16 2100

## 2016-09-22 NOTE — H&P (Signed)
History and Physical    Eileen A Uptain VOH:[id number] DOB: [birth year] DOA: 09/22/2016  PCP: Lavella Lemons, PA  Patient coming from: Home.    Chief Complaint:   SOB>   HPI: Eileen [name] is an 71 y.o. female with hx of afib on Xarelto, COPD on home oxygen at 2 L [place], Obesity, HTN, breast CA, saw ER MD about 2-3 weeks ago, given increased dose of Prednisone and Levoquin, gotten better, but started having more wheezing when she came off them, presented to the ER for evaluation.  She has a negative CXR, and chronic leukocytosis on chronic prednisone, normal Hb and Cr. She was given nebs, and IV Solumedrol.  When she was attempting to ambulate, she desat to 70 percent and became SOB.  She has no orthopnea, CP, PND or peripheral edema.  Of note, she has been on Xarelto, Prednisone, and NSAIDS with no PPI prophylaxis.  Hospitalist was asked to admit her for hypoxic respiratory failure (COPD exacerbation).     ED Course:  See above.  Rewiew of Systems:  Constitutional: Negative for malaise, fever and chills. No significant weight loss or weight gain Eyes: Negative for eye pain, redness and discharge, diplopia, visual changes, or flashes of light. ENMT: Negative for ear pain, hoarseness, nasal congestion, sinus pressure and sore throat. No headaches; tinnitus, drooling, or problem swallowing. Cardiovascular: Negative for chest pain, palpitations, diaphoresis, dyspnea and peripheral edema. ; No orthopnea, PND Respiratory: Negative for cough, hemoptysis, wheezing and stridor. No pleuritic chestpain. Gastrointestinal: Negative for diarrhea, constipation,  melena, blood in stool, hematemesis, jaundice and rectal bleeding.    Genitourinary: Negative for frequency, dysuria, incontinence,flank pain and hematuria; Musculoskeletal: Negative for back pain and neck pain. Negative for swelling and trauma.;  Skin: . Negative for pruritus, rash, abrasions, bruising and skin lesion.; ulcerations Neuro:  Negative for headache, lightheadedness and neck stiffness. Negative for weakness, altered level of consciousness , altered mental status, extremity weakness, burning feet, involuntary movement, seizure and syncope.  Psych: negative for anxiety, depression, insomnia, tearfulness, panic attacks, hallucinations, paranoia, suicidal or homicidal ideation    Past Medical History:  Diagnosis Date  . Asthma   . COPD (chronic obstructive pulmonary disease) ([place])   . History of breast cancer    right breast  . Hypertension      Past Surgical History:  Procedure Laterality Date  . CESAREAN SECTION    . HERNIA REPAIR     RIH  . MASTECTOMY  2011   right breast     reports that she quit smoking about 22 years ago. Her smoking use included Cigarettes. She started smoking about 53 years ago. She has a 16.00 pack-year smoking history. She has never used smokeless tobacco. She reports that she does not drink alcohol or use drugs.  Allergies  Allergen Reactions  . Codeine Other (See Comments)    "jittery"    Family History  Problem Relation Age of Onset  . Cancer Mother        lung  . Diabetes Brother      Prior to Admission medications   Medication Sig Start Date End Date Taking? Authorizing Provider  albuterol (PROVENTIL) (2.5 MG/3ML) 0.083% nebulizer solution Take 2.5 mg by nebulization every 6 (six) hours as needed for wheezing or shortness of breath.    [provider]  Ascorbic Acid (VITAMIN C PO) Take 1 tablet by mouth daily.    [provider]  Cholecalciferol (VITAMIN D PO) Take 1 tablet by mouth daily.  [provider]  diltiazem (CARDIZEM CD) 240 MG 24 hr capsule TAKE ONE CAPSULE BY MOUTH DAILY. 07/28/16   Herminio Commons, MD  furosemide (LASIX) 40 MG tablet Take 1 tablet by mouth daily as needed for fluid or edema.  04/15/15   [provider]  metoprolol tartrate (LOPRESSOR) 25 MG tablet Take 1 tablet (25 mg total) by mouth 2 (two) times  daily. 12/23/15   Herminio Commons, MD  OXYGEN Inhale 2 L into the lungs daily. At night and daily as needed    [provider]  polyethylene glycol powder (GLYCOLAX/MIRALAX) powder Take 17 g by mouth daily as needed for mild constipation or moderate constipation.  04/13/15   [provider]  predniSONE (DELTASONE) 5 MG tablet Take 5 mg by mouth every other day. 04/19/15   [provider]  PROAIR HFA 108 (90 BASE) MCG/ACT inhaler Inhale 2 puffs into the lungs every 6 (six) hours as needed for wheezing or shortness of breath.  03/06/11   [provider]  SYMBICORT 160-4.5 MCG/ACT inhaler Inhale 2 puffs into the lungs daily as needed (for shortness of breath).  12/14/10   [provider]  telmisartan-hydrochlorothiazide (MICARDIS HCT) 80-12.5 MG tablet Take 0.5 tablets by mouth daily. 03/18/15   [provider]  Vitamin D, Ergocalciferol, (DRISDOL) 50000 UNITS CAPS capsule Take 50,000 Units by mouth every 30 (thirty) days.    [provider]  XARELTO 20 MG TABS tablet TAKE ONE TABLET BY MOUTH DAILY WITH SUPPER. 04/28/16   Herminio Commons, MD    Physical Exam: Vitals:   09/22/16 2029 09/22/16 2030 09/22/16 2031 09/22/16 2032  BP:      Pulse: 92 94 93 89  Resp: 18 17 19 17   Temp:      SpO2: 95% 96% 95% 96%      Constitutional: NAD, calm, comfortable Vitals:   09/22/16 2029 09/22/16 2030 09/22/16 2031 09/22/16 2032  BP:      Pulse: 92 94 93 89  Resp: 18 17 19 17   Temp:      SpO2: 95% 96% 95% 96%   Eyes: PERRL, lids and conjunctivae normal ENMT: Mucous membranes are moist. Posterior pharynx clear of any exudate or lesions.Normal dentition.  Neck: normal, supple, no masses, no thyromegaly Respiratory: clear to auscultation bilaterally, no wheezing, no crackles. Normal respiratory effort. No accessory muscle use.  Cardiovascular: Regular rate and rhythm, no murmurs / rubs / gallops. No extremity edema. 2+ pedal pulses. No  carotid bruits.  Abdomen: no tenderness, no masses palpated. No hepatosplenomegaly. Bowel sounds positive.  Musculoskeletal: no clubbing / cyanosis. No joint deformity upper and lower extremities. Good ROM, no contractures. Normal muscle tone.  Skin: no rashes, lesions, ulcers. No induration Neurologic: CN 2-12 grossly intact. Sensation intact, DTR normal. Strength 5/5 in all 4.  Psychiatric: Normal judgment and insight. Alert and oriented x 3. Normal mood.     Labs on Admission: I have personally reviewed following labs and imaging studies  CBC:  Recent Labs Lab 09/22/16 1824  WBC 13.3*  NEUTROABS 11.4*  HGB 14.7  HCT 46.4*  MCV 96.3  PLT 427   Basic Metabolic Panel:  Recent Labs Lab 09/22/16 1824  NA 138  K 4.6  CL 95*  CO2 34*  GLUCOSE 194*  BUN 17  CREATININE 0.87  CALCIUM 10.1   GFR: CrCl cannot be calculated (Unknown ideal weight.). Liver Function Tests:  Recent Labs Lab 09/22/16 1824  AST 22  ALT 21  ALKPHOS 69  BILITOT 0.7  PROT 7.5  ALBUMIN 4.5   Cardiac Enzymes:  Recent Labs Lab 09/22/16 1824  TROPONINI <0.03   Urine analysis:    Component Value Date/Time   COLORURINE YELLOW 09/09/2009 1010   APPEARANCEUR HAZY (A) 09/09/2009 1010   LABSPEC 1.030 09/09/2009 1010   PHURINE 5.5 09/09/2009 1010   GLUCOSEU NEGATIVE 09/09/2009 1010   HGBUR NEGATIVE 09/09/2009 1010   BILIRUBINUR NEGATIVE 09/09/2009 1010   KETONESUR NEGATIVE 09/09/2009 1010   PROTEINUR NEGATIVE 09/09/2009 1010   UROBILINOGEN 0.2 09/09/2009 1010   NITRITE NEGATIVE 09/09/2009 1010   LEUKOCYTESUR  09/09/2009 1010    NEGATIVE MICROSCOPIC NOT DONE ON URINES WITH NEGATIVE PROTEIN, BLOOD, LEUKOCYTES, NITRITE, OR GLUCOSE <1000 mg/dL.   Radiological Exams on Admission: Dg Chest Portable 1 View  Result Date: 09/22/2016 CLINICAL DATA:  Shortness of breath. EXAM: PORTABLE CHEST 1 VIEW COMPARISON:  CT of the chest 09/06/2016. FINDINGS: Heart size is normal. Pulmonary arterial  enlargement is again noted. Mild chronic interstitial coarsening is present. There is no edema or effusion. No focal airspace disease is present. Chronic blunting of the left CP angle is related to a diaphragmatic hernia. IMPRESSION: 1. No acute cardiopulmonary disease or significant interval change. 2. Pulmonary arterial hypertension. Electronically Signed   By: San Morelle M.D.   On: 09/22/2016 19:00   Assessment/Plan Active Problems:   COPD exacerbation (HCC)   Obesity   Anticoagulation management encounter   Acute on chronic respiratory failure (HCC)   PLAN:   Hypoxic respiratory failure (COPD exacerbation):  She really doesn't require IV steroids.  WIll increase her oral Prednisone to  20mg  per day.  Will give IV ZIthromax, and nebs.  SHE HAS TO STOP HER ADVIL, AND SHE NEEDS TO BE ON A PPI.   She is at significant risk for an upper GI bleed.    Hx of afib:  Will continue with her cardiazem, and Xarelto.  As above, will add PPI.   Her rate is controlled.   DVT prophylaxis: Xarelto Code Status: full code.  Family Communication: 2 daughters at bedside.  She lives with one of them.  Disposition Plan: Home.  Consults called: None. Admission status: OBS.    Fitzpatrick Alberico MD FACP. [facility]  If 7PM-7AM, please contact night-coverage [email or web address] Password [email or web address]  09/22/2016, 9:08 PM

## 2016-09-22 NOTE — ED Notes (Signed)
Report given to [place], RN 300

## 2016-09-22 NOTE — ED Notes (Signed)
Assisted pt to BR with portable O2 on 2L. After ambulation to BR O2 sats low 70s, EDP Zammit notified upon him entering pt room, O2 placed on 4L sats mid 90's

## 2016-09-23 DIAGNOSIS — Z7952 Long term (current) use of systemic steroids: Secondary | ICD-10-CM | POA: Diagnosis not present

## 2016-09-23 DIAGNOSIS — Z853 Personal history of malignant neoplasm of breast: Secondary | ICD-10-CM | POA: Diagnosis not present

## 2016-09-23 DIAGNOSIS — I482 Chronic atrial fibrillation: Secondary | ICD-10-CM | POA: Diagnosis present

## 2016-09-23 DIAGNOSIS — Z9010 Acquired absence of unspecified breast and nipple: Secondary | ICD-10-CM | POA: Diagnosis not present

## 2016-09-23 DIAGNOSIS — J441 Chronic obstructive pulmonary disease with (acute) exacerbation: Secondary | ICD-10-CM | POA: Diagnosis not present

## 2016-09-23 DIAGNOSIS — E0965 Drug or chemical induced diabetes mellitus with hyperglycemia: Secondary | ICD-10-CM | POA: Diagnosis present

## 2016-09-23 DIAGNOSIS — E669 Obesity, unspecified: Secondary | ICD-10-CM | POA: Diagnosis present

## 2016-09-23 DIAGNOSIS — I4891 Unspecified atrial fibrillation: Secondary | ICD-10-CM | POA: Diagnosis not present

## 2016-09-23 DIAGNOSIS — E119 Type 2 diabetes mellitus without complications: Secondary | ICD-10-CM | POA: Diagnosis not present

## 2016-09-23 DIAGNOSIS — I10 Essential (primary) hypertension: Secondary | ICD-10-CM | POA: Diagnosis not present

## 2016-09-23 DIAGNOSIS — R739 Hyperglycemia, unspecified: Secondary | ICD-10-CM | POA: Diagnosis not present

## 2016-09-23 DIAGNOSIS — J9622 Acute and chronic respiratory failure with hypercapnia: Secondary | ICD-10-CM | POA: Diagnosis not present

## 2016-09-23 DIAGNOSIS — Z87891 Personal history of nicotine dependence: Secondary | ICD-10-CM | POA: Diagnosis not present

## 2016-09-23 DIAGNOSIS — K649 Unspecified hemorrhoids: Secondary | ICD-10-CM | POA: Diagnosis present

## 2016-09-23 DIAGNOSIS — I4892 Unspecified atrial flutter: Secondary | ICD-10-CM | POA: Diagnosis not present

## 2016-09-23 DIAGNOSIS — R0602 Shortness of breath: Secondary | ICD-10-CM | POA: Diagnosis present

## 2016-09-23 DIAGNOSIS — Z809 Family history of malignant neoplasm, unspecified: Secondary | ICD-10-CM | POA: Diagnosis not present

## 2016-09-23 DIAGNOSIS — Z9981 Dependence on supplemental oxygen: Secondary | ICD-10-CM | POA: Diagnosis not present

## 2016-09-23 DIAGNOSIS — Z885 Allergy status to narcotic agent status: Secondary | ICD-10-CM | POA: Diagnosis not present

## 2016-09-23 DIAGNOSIS — J9621 Acute and chronic respiratory failure with hypoxia: Secondary | ICD-10-CM | POA: Diagnosis not present

## 2016-09-23 DIAGNOSIS — Z833 Family history of diabetes mellitus: Secondary | ICD-10-CM | POA: Diagnosis not present

## 2016-09-23 DIAGNOSIS — Z6833 Body mass index (BMI) 33.0-33.9, adult: Secondary | ICD-10-CM | POA: Diagnosis not present

## 2016-09-23 DIAGNOSIS — Z7901 Long term (current) use of anticoagulants: Secondary | ICD-10-CM | POA: Diagnosis not present

## 2016-09-23 LAB — COMPREHENSIVE METABOLIC PANEL
ALT: 18 U/L (ref 14–54)
ANION GAP: 8 (ref 5–15)
AST: 18 U/L (ref 15–41)
Albumin: 4.1 g/dL (ref 3.5–5.0)
Alkaline Phosphatase: 53 U/L (ref 38–126)
BUN: 14 mg/dL (ref 6–20)
CO2: 35 mmol/L — AB (ref 22–32)
Calcium: 9.6 mg/dL (ref 8.9–10.3)
Chloride: 97 mmol/L — ABNORMAL LOW (ref 101–111)
Creatinine, Ser: 0.66 mg/dL (ref 0.44–1.00)
GFR calc Af Amer: >60 mL/min (ref >60)
GFR calc non Af Amer: >60 mL/min (ref >60)
GLUCOSE: 173 mg/dL — AB (ref 65–99)
Potassium: 4.7 mmol/L (ref 3.5–5.1)
SODIUM: 140 mmol/L (ref 135–145)
Total Bilirubin: 0.7 mg/dL (ref 0.3–1.2)
Total Protein: 7 g/dL (ref 6.5–8.1)

## 2016-09-23 LAB — CBC
HCT: 42.7 % (ref 36.0–46.0)
HEMOGLOBIN: 13.5 g/dL (ref 12.0–15.0)
MCH: 29.9 pg (ref 26.0–34.0)
MCHC: 31.6 g/dL (ref 30.0–36.0)
MCV: 94.7 fL (ref 78.0–100.0)
Platelets: 240 10*3/uL (ref 150–400)
RBC: 4.51 MIL/uL (ref 3.87–5.11)
RDW: 14 % (ref 11.5–15.5)
WBC: 14.4 10*3/uL — ABNORMAL HIGH (ref 4.0–10.5)

## 2016-09-23 LAB — TSH: TSH: 0.226 u[IU]/mL — ABNORMAL LOW (ref 0.350–4.500)

## 2016-09-23 MED ORDER — METHYLPREDNISOLONE SODIUM SUCC 125 MG IJ SOLR
60.0000 mg | Freq: Two times a day (BID) | INTRAMUSCULAR | Status: DC
Start: 2016-09-23 — End: 2016-09-29
  Administered 2016-09-23 – 2016-09-29 (×12): 60 mg via INTRAVENOUS
  Filled 2016-09-23 (×12): qty 2

## 2016-09-23 MED ORDER — IPRATROPIUM-ALBUTEROL 0.5-2.5 (3) MG/3ML IN SOLN
3.0000 mL | Freq: Four times a day (QID) | RESPIRATORY_TRACT | Status: DC
  Administered 2016-09-23 – 2016-09-25 (×7): 3 mL via RESPIRATORY_TRACT
  Filled 2016-09-23 (×7): qty 3

## 2016-09-23 MED ORDER — GUAIFENESIN ER 600 MG PO TB12
1200.0000 mg | ORAL_TABLET | Freq: Two times a day (BID) | ORAL | Status: DC
  Administered 2016-09-23 – 2016-09-29 (×12): 1200 mg via ORAL
  Filled 2016-09-23 (×12): qty 2

## 2016-09-23 NOTE — Progress Notes (Signed)
Patient ambulated 125 feet on 3l of oxygen. Patient's oxygen saturation dropped to 81% during ambulation and went back up to 94% on 3l at rest.

## 2016-09-23 NOTE — Progress Notes (Signed)
Pt requested O2 be turned down to 2L from 3L. O2 saturation remained stable at 2L. Will continue to monitor

## 2016-09-23 NOTE — Progress Notes (Signed)
PROGRESS NOTE    [name] Ciancio  GEX:[id number] DOB: 1945/05/15 DOA: 09/22/2016 PCP: Lavella Lemons, PA   Brief Narrative:  71 year old female with a history of steroid-dependent COPD and chronic respiratory failure, presents to the hospital with COPD exacerbation. She was admitted for steroids, antibiotics and bronchodilators. She is slowly improving. Anticipate discharge home in the next 24 hours.   Assessment & Plan:   Active Problems:   COPD exacerbation ([place])   Obesity   Essential hypertension   Anticoagulation management encounter   Acute on chronic respiratory failure (HCC)   Atrial flutter ([place])   1. COPD exacerbation. Patient presented to the hospital with shortness of breath and wheezing. She does have COPD exacerbation. Started on oral prednisone, but she continues to wheeze. We'll give her a dose of IV Solu-Medrol. Continue on antibiotics and bronchodilators. Continue pulmonary hygiene. 2. Acute on chronic respiratory failure. With COPD exacerbation. Appears to be approaching baseline. 3. History of atrial flutter. Continue on Cardizem. She is anticoagulated with Xarelto . 4. Hypertension. Blood pressures currently stable. Continue to monitor.   DVT prophylaxis: xarelto Code Status: full code Family Communication: discussed with daughters at the bedside Disposition Plan: discharge home once improved, likely in AM   Consultants:     Procedures:     Antimicrobials:   Azithromycin 7/13>>    Subjective: Feeling better, but not back to baseline yet. Still has some wheezing  Objective: Vitals:   09/23/16 0742 09/23/16 1013 09/23/16 1149 09/23/16 1507  BP:  118/60  (!) 105/48  Pulse:    86  Resp:    18  Temp:    98.1 F (36.7 C)  TempSrc:      SpO2: 93%  93% 94%  Weight:      Height:        Intake/Output Summary (Last 24 hours) at 09/23/16 1648 Last data filed at 09/23/16 1300  Gross per 24 hour  Intake              730 ml  Output               600 ml  Net              130 ml   Filed Weights   09/22/16 2210  Weight: 84.9 kg (187 lb 3.2 oz)    Examination:  General exam: Appears calm and comfortable  Respiratory system: wheezing bilaterally Respiratory effort normal. Cardiovascular system: S1 & S2 heard, RRR. No JVD, murmurs, rubs, gallops or clicks. No pedal edema. Gastrointestinal system: Abdomen is nondistended, soft and nontender. No organomegaly or masses felt. Normal bowel sounds heard. Central nervous system: Alert and oriented. No focal neurological deficits. Extremities: Symmetric 5 x 5 power. Skin: No rashes, lesions or ulcers Psychiatry: Judgement and insight appear normal. Mood & affect appropriate.     Data Reviewed: I have personally reviewed following labs and imaging studies  CBC:  Recent Labs Lab 09/22/16 1824 09/23/16 0541  WBC 13.3* 14.4*  NEUTROABS 11.4*  --   HGB 14.7 13.5  HCT 46.4* 42.7  MCV 96.3 94.7  PLT 249 010   Basic Metabolic Panel:  Recent Labs Lab 09/22/16 1824 09/23/16 0541  NA 138 140  K 4.6 4.7  CL 95* 97*  CO2 34* 35*  GLUCOSE 194* 173*  BUN 17 14  CREATININE 0.87 0.66  CALCIUM 10.1 9.6   GFR: Estimated Creatinine Clearance: 66.6 mL/min (by C-G formula based on SCr of 0.66 mg/dL). Liver Function Tests:  Recent Labs Lab 09/22/16 1824 09/23/16 0541  AST 22 18  ALT 21 18  ALKPHOS 69 53  BILITOT 0.7 0.7  PROT 7.5 7.0  ALBUMIN 4.5 4.1   No results for input(s): LIPASE, AMYLASE in the last 168 hours. No results for input(s): AMMONIA in the last 168 hours. Coagulation Profile: No results for input(s): INR, PROTIME in the last 168 hours. Cardiac Enzymes:  Recent Labs Lab 09/22/16 1824  TROPONINI <0.03   BNP (last 3 results) No results for input(s): PROBNP in the last 8760 hours. HbA1C: No results for input(s): HGBA1C in the last 72 hours. CBG: No results for input(s): GLUCAP in the last 168 hours. Lipid Profile: No results for input(s): CHOL, HDL,  LDLCALC, TRIG, CHOLHDL, LDLDIRECT in the last 72 hours. Thyroid Function Tests:  Recent Labs  09/23/16 0542  TSH 0.226*   Anemia Panel: No results for input(s): VITAMINB12, FOLATE, FERRITIN, TIBC, IRON, RETICCTPCT in the last 72 hours. Sepsis Labs: No results for input(s): PROCALCITON, LATICACIDVEN in the last 168 hours.  No results found for this or any previous visit (from the past 240 hour(s)).       Radiology Studies: Dg Chest Portable 1 View  Result Date: 09/22/2016 CLINICAL DATA:  Shortness of breath. EXAM: PORTABLE CHEST 1 VIEW COMPARISON:  CT of the chest 09/06/2016. FINDINGS: Heart size is normal. Pulmonary arterial enlargement is again noted. Mild chronic interstitial coarsening is present. There is no edema or effusion. No focal airspace disease is present. Chronic blunting of the left CP angle is related to a diaphragmatic hernia. IMPRESSION: 1. No acute cardiopulmonary disease or significant interval change. 2. Pulmonary arterial hypertension. Electronically Signed   By: San Morelle M.D.   On: 09/22/2016 19:00        Scheduled Meds: . diltiazem  240 mg Oral Daily  . guaiFENesin  1,200 mg Oral BID  . irbesartan  150 mg Oral Daily   And  . hydrochlorothiazide  6.25 mg Oral Daily  . ipratropium-albuterol  3 mL Nebulization Q6H  . methylPREDNISolone (SOLU-MEDROL) injection  60 mg Intravenous Q12H  . metoprolol tartrate  25 mg Oral BID  . mometasone-formoterol  2 puff Inhalation BID  . pantoprazole  40 mg Oral Q0600  . rivaroxaban  20 mg Oral Q supper  . vitamin C  1,000 mg Oral Daily  . Vitamin D (Ergocalciferol)  50,000 Units Oral Q30 days   Continuous Infusions: . azithromycin Stopped (09/23/16 0106)     LOS: 0 days    Time spent: 80mins    Cace Osorto, MD [facility] Pager [phone] If 7PM-7AM, please contact night-coverage [email or web address] Password [email or web address] 09/23/2016, 4:48 PM

## 2016-09-24 DIAGNOSIS — I4891 Unspecified atrial fibrillation: Secondary | ICD-10-CM | POA: Diagnosis not present

## 2016-09-24 MED ORDER — METOPROLOL TARTRATE 50 MG PO TABS
50.0000 mg | ORAL_TABLET | Freq: Two times a day (BID) | ORAL | Status: DC
  Administered 2016-09-24 – 2016-09-29 (×10): 50 mg via ORAL
  Filled 2016-09-24 (×10): qty 1

## 2016-09-24 MED ORDER — DILTIAZEM HCL 25 MG/5ML IV SOLN
10.0000 mg | Freq: Once | INTRAVENOUS | Status: AC
  Administered 2016-09-24: 10 mg via INTRAVENOUS
  Filled 2016-09-24: qty 5

## 2016-09-24 MED ORDER — DILTIAZEM HCL 25 MG/5ML IV SOLN
10.0000 mg | Freq: Once | INTRAVENOUS | Status: AC
  Administered 2016-09-24: 10 mg via INTRAVENOUS

## 2016-09-24 MED ORDER — SODIUM CHLORIDE 0.9 % IV BOLUS (SEPSIS)
500.0000 mL | Freq: Once | INTRAVENOUS | Status: AC
  Administered 2016-09-24: 500 mL via INTRAVENOUS

## 2016-09-24 MED ORDER — DIGOXIN 0.25 MG/ML IJ SOLN
0.2500 mg | Freq: Once | INTRAMUSCULAR | Status: AC
  Administered 2016-09-24: 0.25 mg via INTRAVENOUS
  Filled 2016-09-24: qty 2

## 2016-09-24 NOTE — Progress Notes (Signed)
MD informed of patient's heart rate running in the 120s , BP 100/60 and patient asymptomatic. Orders given

## 2016-09-24 NOTE — Progress Notes (Signed)
MD informed of sustained HR around 140s, orders given

## 2016-09-24 NOTE — Progress Notes (Signed)
PROGRESS NOTE    Eileen [name]  WYO:[id number] DOB: 04-18-1945 DOA: 09/22/2016 PCP: Lavella Lemons, PA   Brief Narrative:  71 year old female with a history of steroid-dependent COPD and chronic respiratory failure, presents to the hospital with COPD exacerbation. She was admitted for steroids, antibiotics and bronchodilators. She is slowly improving. This morning she developed rapid atrial fibrillation. We'll continue to monitor on telemetry.   Assessment & Plan:   Active Problems:   COPD exacerbation ([place])   Obesity   Essential hypertension   Anticoagulation management encounter   Acute on chronic respiratory failure (HCC)   Atrial flutter (HCC)   Atrial fibrillation with RVR ([place])   1. COPD exacerbation. Respiratory status is improving. Continue on IV steroids, antibiotics. Change albuterol to Xopenex in light of the rapid heart rate. Continue pulmonary hygiene. 2. Acute on chronic respiratory failure. With COPD exacerbation. Appears to be approaching baseline. 3. Atrial fibrillation with RVR. Patient noted to be in rapid atrial fibrillation this morning. Blood pressure is on low side. Will continue with oral rate control medications. If heart rate does not improve, can consider IV therapy. She is anticoagulated with Xarelto. 4. Hypertension. Blood pressures running low. Hold ARB/HCTZ   DVT prophylaxis: xarelto Code Status: full code Family Communication: discussed with daughters at the bedside Disposition Plan: discharge home once improved, likely in AM   Consultants:     Procedures:     Antimicrobials:   Azithromycin 7/13>>    Subjective: Feels lightheaded this morning. No chest pain, no worsening shortness of breath  Objective: Vitals:   09/24/16 0747 09/24/16 0750 09/24/16 0800 09/24/16 1000  BP:   (!) 92/53 100/64  Pulse:      Resp:      Temp:      TempSrc:      SpO2: 94% 97%    Weight:      Height:        Intake/Output Summary (Last 24  hours) at 09/24/16 1010 Last data filed at 09/24/16 0857  Gross per 24 hour  Intake             1810 ml  Output              300 ml  Net             1510 ml   Filed Weights   09/22/16 2210  Weight: 84.9 kg (187 lb 3.2 oz)    Examination:  General exam: Alert, awake, oriented x 3 Respiratory system: Clear to auscultation. Respiratory effort normal. Cardiovascular system: s1, s2, irregular. No murmurs, rubs, gallops. Gastrointestinal system: Abdomen is nondistended, soft and nontender. No organomegaly or masses felt. Normal bowel sounds heard. Central nervous system: Alert and oriented. No focal neurological deficits. Extremities: No C/C/E, +pedal pulses Skin: No rashes, lesions or ulcers Psychiatry: Judgement and insight appear normal. Mood & affect appropriate.       Data Reviewed: I have personally reviewed following labs and imaging studies  CBC:  Recent Labs Lab 09/22/16 1824 09/23/16 0541  WBC 13.3* 14.4*  NEUTROABS 11.4*  --   HGB 14.7 13.5  HCT 46.4* 42.7  MCV 96.3 94.7  PLT 249 774   Basic Metabolic Panel:  Recent Labs Lab 09/22/16 1824 09/23/16 0541  NA 138 140  K 4.6 4.7  CL 95* 97*  CO2 34* 35*  GLUCOSE 194* 173*  BUN 17 14  CREATININE 0.87 0.66  CALCIUM 10.1 9.6   GFR: Estimated Creatinine Clearance: 66.6 mL/min (by  C-G formula based on SCr of 0.66 mg/dL). Liver Function Tests:  Recent Labs Lab 09/22/16 1824 09/23/16 0541  AST 22 18  ALT 21 18  ALKPHOS 69 53  BILITOT 0.7 0.7  PROT 7.5 7.0  ALBUMIN 4.5 4.1   No results for input(s): LIPASE, AMYLASE in the last 168 hours. No results for input(s): AMMONIA in the last 168 hours. Coagulation Profile: No results for input(s): INR, PROTIME in the last 168 hours. Cardiac Enzymes:  Recent Labs Lab 09/22/16 1824  TROPONINI <0.03   BNP (last 3 results) No results for input(s): PROBNP in the last 8760 hours. HbA1C: No results for input(s): HGBA1C in the last 72 hours. CBG: No  results for input(s): GLUCAP in the last 168 hours. Lipid Profile: No results for input(s): CHOL, HDL, LDLCALC, TRIG, CHOLHDL, LDLDIRECT in the last 72 hours. Thyroid Function Tests:  Recent Labs  09/23/16 0542  TSH 0.226*   Anemia Panel: No results for input(s): VITAMINB12, FOLATE, FERRITIN, TIBC, IRON, RETICCTPCT in the last 72 hours. Sepsis Labs: No results for input(s): PROCALCITON, LATICACIDVEN in the last 168 hours.  No results found for this or any previous visit (from the past 240 hour(s)).       Radiology Studies: Dg Chest Portable 1 View  Result Date: 09/22/2016 CLINICAL DATA:  Shortness of breath. EXAM: PORTABLE CHEST 1 VIEW COMPARISON:  CT of the chest 09/06/2016. FINDINGS: Heart size is normal. Pulmonary arterial enlargement is again noted. Mild chronic interstitial coarsening is present. There is no edema or effusion. No focal airspace disease is present. Chronic blunting of the left CP angle is related to a diaphragmatic hernia. IMPRESSION: 1. No acute cardiopulmonary disease or significant interval change. 2. Pulmonary arterial hypertension. Electronically Signed   By: San Morelle M.D.   On: 09/22/2016 19:00        Scheduled Meds: . diltiazem  240 mg Oral Daily  . guaiFENesin  1,200 mg Oral BID  . ipratropium-albuterol  3 mL Nebulization Q6H  . methylPREDNISolone (SOLU-MEDROL) injection  60 mg Intravenous Q12H  . metoprolol tartrate  25 mg Oral BID  . mometasone-formoterol  2 puff Inhalation BID  . pantoprazole  40 mg Oral Q0600  . rivaroxaban  20 mg Oral Q supper  . vitamin C  1,000 mg Oral Daily  . Vitamin D (Ergocalciferol)  50,000 Units Oral Q30 days   Continuous Infusions: . azithromycin Stopped (09/23/16 2308)     LOS: 1 day    Time spent: 47mins    Tarez Bowns, MD [facility] Pager [phone] If 7PM-7AM, please contact night-coverage [email or web address] Password [email or web address] 09/24/2016, 10:10 AM

## 2016-09-24 NOTE — Progress Notes (Signed)
Digoxin given IV per MD orders. Patient states that she feels "pretty good". Patient sitting on edge of bed at this time. Visitors have left. Will continue to monitor.

## 2016-09-24 NOTE — Progress Notes (Signed)
HR still maintaining 130s even after Digoxin given IV.Giving Metoprolol 50 mg PO as ordered now. Lamar Blinks, NP notified via text page. Patient remains asymptomatic. Will continue to monitor.

## 2016-09-24 NOTE — Progress Notes (Signed)
Text page to K. Schorr, NP regarding A-flutter, HR sustaining 120s-130s per tele.

## 2016-09-25 LAB — BASIC METABOLIC PANEL
Anion gap: 7 (ref 5–15)
BUN: 21 mg/dL — ABNORMAL HIGH (ref 6–20)
CALCIUM: 9 mg/dL (ref 8.9–10.3)
CHLORIDE: 93 mmol/L — AB (ref 101–111)
CO2: 36 mmol/L — AB (ref 22–32)
CREATININE: 0.67 mg/dL (ref 0.44–1.00)
GFR calc Af Amer: >60 mL/min (ref >60)
GFR calc non Af Amer: >60 mL/min (ref >60)
GLUCOSE: 230 mg/dL — AB (ref 65–99)
Potassium: 4.8 mmol/L (ref 3.5–5.1)
Sodium: 136 mmol/L (ref 135–145)

## 2016-09-25 LAB — GLUCOSE, CAPILLARY
GLUCOSE-CAPILLARY: 294 mg/dL — AB (ref 65–99)
Glucose-Capillary: 248 mg/dL — ABNORMAL HIGH (ref 65–99)

## 2016-09-25 LAB — CBC
HCT: 40.9 % (ref 36.0–46.0)
Hemoglobin: 12.8 g/dL (ref 12.0–15.0)
MCH: 30.3 pg (ref 26.0–34.0)
MCHC: 31.3 g/dL (ref 30.0–36.0)
MCV: 96.7 fL (ref 78.0–100.0)
PLATELETS: 248 10*3/uL (ref 150–400)
RBC: 4.23 MIL/uL (ref 3.87–5.11)
RDW: 14.2 % (ref 11.5–15.5)
WBC: 19.5 10*3/uL — AB (ref 4.0–10.5)

## 2016-09-25 MED ORDER — INSULIN ASPART 100 UNIT/ML ~~LOC~~ SOLN
0.0000 [IU] | Freq: Three times a day (TID) | SUBCUTANEOUS | Status: DC
  Administered 2016-09-25: 8 [IU] via SUBCUTANEOUS
  Administered 2016-09-26: 5 [IU] via SUBCUTANEOUS

## 2016-09-25 MED ORDER — DILTIAZEM HCL 25 MG/5ML IV SOLN: 10.0000 mg | Freq: Once | INTRAVENOUS | Status: DC

## 2016-09-25 MED ORDER — AZITHROMYCIN 250 MG PO TABS
500.0000 mg | ORAL_TABLET | Freq: Every day | ORAL | Status: DC
  Administered 2016-09-25 – 2016-09-28 (×4): 500 mg via ORAL
  Filled 2016-09-25 (×4): qty 2

## 2016-09-25 MED ORDER — INSULIN ASPART 100 UNIT/ML ~~LOC~~ SOLN
0.0000 [IU] | Freq: Every day | SUBCUTANEOUS | Status: DC
  Administered 2016-09-25: 2 [IU] via SUBCUTANEOUS

## 2016-09-25 MED ORDER — LEVALBUTEROL HCL 0.63 MG/3ML IN NEBU
0.6300 mg | INHALATION_SOLUTION | Freq: Four times a day (QID) | RESPIRATORY_TRACT | Status: DC
  Administered 2016-09-25 – 2016-09-29 (×16): 0.63 mg via RESPIRATORY_TRACT
  Filled 2016-09-25 (×16): qty 3

## 2016-09-25 MED ORDER — HYDROCORTISONE ACETATE 25 MG RE SUPP
25.0000 mg | Freq: Two times a day (BID) | RECTAL | Status: DC
  Administered 2016-09-25 – 2016-09-26 (×3): 25 mg via RECTAL
  Filled 2016-09-25 (×7): qty 1

## 2016-09-25 MED ORDER — IPRATROPIUM BROMIDE 0.02 % IN SOLN
0.5000 mg | Freq: Four times a day (QID) | RESPIRATORY_TRACT | Status: DC
  Administered 2016-09-25 – 2016-09-29 (×16): 0.5 mg via RESPIRATORY_TRACT
  Filled 2016-09-25 (×16): qty 2.5

## 2016-09-25 NOTE — Progress Notes (Signed)
PROGRESS NOTE    Eileen [name]  QBH:[id number] DOB: 1946/02/17 DOA: 09/22/2016 PCP: Lavella Lemons, PA   Brief Narrative:  71 year old female with a history of steroid-dependent COPD and chronic respiratory failure, presents to the hospital with COPD exacerbation. She was admitted for steroids, antibiotics and bronchodilators. She is slowly improving. On 7/15 she developed rapid atrial fibrillation, but has since converted back to sinus rhythm. We'll continue to monitor on telemetry.   Assessment & Plan:   Active Problems:   COPD exacerbation ([place])   Obesity   Essential hypertension   Anticoagulation management encounter   Acute on chronic respiratory failure (HCC)   Atrial flutter (HCC)   Atrial fibrillation with RVR ([place])   1. COPD exacerbation. Respiratory status is slowly improving. Continue on IV steroids, antibiotics. Change albuterol to Xopenex in light of the rapid heart rate. Continue pulmonary hygiene. Add flutter valve 2. Acute on chronic respiratory failure. With COPD exacerbation. Not back to baseline yet. Continue current treatments. 3. Atrial fibrillation with RVR. Patient developed rapid atrial fibrillation on 7/15. She was treated with IV Cardizem and metoprolol dose was increased from 25 mg twice a day to 50 mg twice a day. Overnight she converted back to sinus rhythm. Continue to monitor She is anticoagulated with Xarelto. 4. Hypertension. Blood pressures stable. Continue to hold ARB/HCTZ 5. Hyperglycemia. Suspect this is related to steroids. Check blood sugars. Check A1c.   DVT prophylaxis: xarelto Code Status: full code Family Communication: discussed with daughter at the bedside Disposition Plan: discharge home once improved   Consultants:     Procedures:     Antimicrobials:   Azithromycin 7/13>>    Subjective: Converted back to sinus rhythm overnight. Still feels short of breath and not back to baseline.  Objective: Vitals:   09/25/16  0513 09/25/16 0926 09/25/16 1300 09/25/16 1510  BP: 101/61  112/62   Pulse: 84  84   Resp: 18  20   Temp: 98.3 F (36.8 C)  97.9 F (36.6 C)   TempSrc: Oral  Oral   SpO2: 95% 94% 94% 97%  Weight:      Height:        Intake/Output Summary (Last 24 hours) at 09/25/16 1615 Last data filed at 09/25/16 1200  Gross per 24 hour  Intake             1390 ml  Output                0 ml  Net             1390 ml   Filed Weights   09/22/16 2210  Weight: 84.9 kg (187 lb 3.2 oz)    Examination:  General exam: Alert, awake, oriented x 3 Respiratory system: diminished breath sounds bilaterally. Mildly increased respiratory effort Cardiovascular system:RRR. No murmurs, rubs, gallops. Gastrointestinal system: Abdomen is nondistended, soft and nontender. No organomegaly or masses felt. Normal bowel sounds heard. Central nervous system: Alert and oriented. No focal neurological deficits. Extremities: No C/C/E, +pedal pulses Skin: No rashes, lesions or ulcers Psychiatry: Judgement and insight appear normal. Mood & affect appropriate.        Data Reviewed: I have personally reviewed following labs and imaging studies  CBC:  Recent Labs Lab 09/22/16 1824 09/23/16 0541 09/25/16 0553  WBC 13.3* 14.4* 19.5*  NEUTROABS 11.4*  --   --   HGB 14.7 13.5 12.8  HCT 46.4* 42.7 40.9  MCV 96.3 94.7 96.7  PLT 249 240 248  Basic Metabolic Panel:  Recent Labs Lab 09/22/16 1824 09/23/16 0541 09/25/16 0553  NA 138 140 136  K 4.6 4.7 4.8  CL 95* 97* 93*  CO2 34* 35* 36*  GLUCOSE 194* 173* 230*  BUN 17 14 21*  CREATININE 0.87 0.66 0.67  CALCIUM 10.1 9.6 9.0   GFR: Estimated Creatinine Clearance: 66.6 mL/min (by C-G formula based on SCr of 0.67 mg/dL). Liver Function Tests:  Recent Labs Lab 09/22/16 1824 09/23/16 0541  AST 22 18  ALT 21 18  ALKPHOS 69 53  BILITOT 0.7 0.7  PROT 7.5 7.0  ALBUMIN 4.5 4.1   No results for input(s): LIPASE, AMYLASE in the last 168 hours. No  results for input(s): AMMONIA in the last 168 hours. Coagulation Profile: No results for input(s): INR, PROTIME in the last 168 hours. Cardiac Enzymes:  Recent Labs Lab 09/22/16 1824  TROPONINI <0.03   BNP (last 3 results) No results for input(s): PROBNP in the last 8760 hours. HbA1C: No results for input(s): HGBA1C in the last 72 hours. CBG: No results for input(s): GLUCAP in the last 168 hours. Lipid Profile: No results for input(s): CHOL, HDL, LDLCALC, TRIG, CHOLHDL, LDLDIRECT in the last 72 hours. Thyroid Function Tests:  Recent Labs  09/23/16 0542  TSH 0.226*   Anemia Panel: No results for input(s): VITAMINB12, FOLATE, FERRITIN, TIBC, IRON, RETICCTPCT in the last 72 hours. Sepsis Labs: No results for input(s): PROCALCITON, LATICACIDVEN in the last 168 hours.  No results found for this or any previous visit (from the past 240 hour(s)).       Radiology Studies: No results found.      Scheduled Meds: . azithromycin  500 mg Oral q1800  . diltiazem  240 mg Oral Daily  . guaiFENesin  1,200 mg Oral BID  . hydrocortisone  25 mg Rectal BID  . insulin aspart  0-15 Units Subcutaneous TID WC  . insulin aspart  0-5 Units Subcutaneous QHS  . ipratropium  0.5 mg Nebulization Q6H  . levalbuterol  0.63 mg Nebulization Q6H  . methylPREDNISolone (SOLU-MEDROL) injection  60 mg Intravenous Q12H  . metoprolol tartrate  50 mg Oral BID  . mometasone-formoterol  2 puff Inhalation BID  . pantoprazole  40 mg Oral Q0600  . rivaroxaban  20 mg Oral Q supper  . vitamin C  1,000 mg Oral Daily  . Vitamin D (Ergocalciferol)  50,000 Units Oral Q30 days   Continuous Infusions:    LOS: 2 days    Time spent: 80mins    Benyamin Jeff, MD [facility] Pager [phone] If 7PM-7AM, please contact night-coverage [email or web address] Password [email or web address] 09/25/2016, 4:15 PM

## 2016-09-25 NOTE — Progress Notes (Signed)
Inpatient Diabetes Program Recommendations  AACE/ADA: New Consensus Statement on Inpatient Glycemic Control (2015)  Target Ranges:  Prepandial:   less than 140 mg/dL      Peak postprandial:   less than 180 mg/dL (1-2 hours)      Critically ill patients:  140 - 180 mg/dL   Results for [place], [name] (MRN [id number]) as of 09/25/2016 10:54  Ref. Range 09/22/2016 18:24 09/23/2016 05:41 09/25/2016 05:53  Glucose Latest Ref Range: 65 - 99 mg/dL 194 (H) 173 (H) 230 (H)   Review of Glycemic Control  Diabetes history: No Outpatient Diabetes medications: NA Current orders for Inpatient glycemic control: None  Inpatient Diabetes Program Recommendations: Correction (SSI): While inpatient and ordered steroids, please consider ordering CBGs with Novolog correction scale ACHS.  Thanks, Barnie Alderman, RN, MSN, CDE Diabetes Coordinator Inpatient Diabetes Program [phone] (Team Pager from 8am to 5pm)

## 2016-09-25 NOTE — Progress Notes (Signed)
Patient's heart rhythm returned to NSR, HR 80s per tele. Lamar Blinks, NP notified via text page. Cardizem IV not given yet since Nursing Supervisor is having difficulty finding med. Lamar Blinks, NP called and orders to not give Cardizem IV one time dose.

## 2016-09-25 NOTE — Progress Notes (Signed)
Lamar Blinks, NP text paged for notification of HR 109-134, sustaining 120s. Patient still asymptomatic. Awaiting response. Will continue to monitor.

## 2016-09-25 NOTE — Progress Notes (Signed)
PHARMACIST - PHYSICIAN COMMUNICATION DR:   Roderic Palau CONCERNING: Antibiotic IV to Oral Route Change Policy  RECOMMENDATION: This patient is receiving ZITHROMAX by the intravenous route.  Based on criteria approved by the Pharmacy and Therapeutics Committee, the antibiotic(s) is/are being converted to the equivalent oral dose form(s).   DESCRIPTION: These criteria include:  Patient being treated for a respiratory tract infection, urinary tract infection, cellulitis or clostridium difficile associated diarrhea if on metronidazole  The patient is not neutropenic and does not exhibit a GI malabsorption state  The patient is eating (either orally or via tube) and/or has been taking other orally administered medications for a least 24 hours  The patient is improving clinically and has a Tmax < 100.5  If you have questions about this conversion, please contact the Pharmacy Department  [x]   [phone] )  [facility] []   [phone] )  [facility] []   [phone] )  [facility] []   [phone] )  [facility] []   [phone] )  [place], PharmD Clinical Pharmacist Pager:  [phone] 09/25/2016

## 2016-09-26 LAB — GLUCOSE, CAPILLARY
GLUCOSE-CAPILLARY: 240 mg/dL — AB (ref 65–99)
GLUCOSE-CAPILLARY: 135 mg/dL — AB (ref 65–99)
GLUCOSE-CAPILLARY: 225 mg/dL — AB (ref 65–99)
Glucose-Capillary: 429 mg/dL — ABNORMAL HIGH (ref 65–99)
Glucose-Capillary: 398 mg/dL — ABNORMAL HIGH (ref 65–99)
Glucose-Capillary: 220 mg/dL — ABNORMAL HIGH (ref 65–99)

## 2016-09-26 LAB — HEMOGLOBIN A1C
HEMOGLOBIN A1C: 7.2 % — AB (ref 4.8–5.6)
MEAN PLASMA GLUCOSE: 160 mg/dL

## 2016-09-26 MED ORDER — INSULIN ASPART 100 UNIT/ML ~~LOC~~ SOLN
3.0000 [IU] | Freq: Three times a day (TID) | SUBCUTANEOUS | Status: DC
  Administered 2016-09-26 – 2016-09-27 (×3): 3 [IU] via SUBCUTANEOUS

## 2016-09-26 MED ORDER — INSULIN ASPART 100 UNIT/ML ~~LOC~~ SOLN
20.0000 [IU] | Freq: Once | SUBCUTANEOUS | Status: AC
  Administered 2016-09-26: 20 [IU] via SUBCUTANEOUS

## 2016-09-26 MED ORDER — INSULIN ASPART 100 UNIT/ML ~~LOC~~ SOLN: 20.0000 [IU] | Freq: Once | SUBCUTANEOUS | Status: DC

## 2016-09-26 MED ORDER — INSULIN ASPART 100 UNIT/ML ~~LOC~~ SOLN
0.0000 [IU] | Freq: Every day | SUBCUTANEOUS | Status: DC
  Administered 2016-09-26 – 2016-09-28 (×2): 2 [IU] via SUBCUTANEOUS

## 2016-09-26 MED ORDER — INSULIN ASPART 100 UNIT/ML ~~LOC~~ SOLN
0.0000 [IU] | Freq: Three times a day (TID) | SUBCUTANEOUS | Status: DC
  Administered 2016-09-26: 3 [IU] via SUBCUTANEOUS
  Administered 2016-09-27: 7 [IU] via SUBCUTANEOUS
  Administered 2016-09-27: 11 [IU] via SUBCUTANEOUS
  Administered 2016-09-27 – 2016-09-28 (×2): 4 [IU] via SUBCUTANEOUS
  Administered 2016-09-28: 7 [IU] via SUBCUTANEOUS
  Administered 2016-09-28: 11 [IU] via SUBCUTANEOUS
  Administered 2016-09-29: 4 [IU] via SUBCUTANEOUS
  Administered 2016-09-29: 11 [IU] via SUBCUTANEOUS

## 2016-09-26 MED ORDER — INSULIN GLARGINE 100 UNIT/ML ~~LOC~~ SOLN
10.0000 [IU] | Freq: Every day | SUBCUTANEOUS | Status: DC
  Administered 2016-09-26 – 2016-09-27 (×2): 10 [IU] via SUBCUTANEOUS
  Filled 2016-09-26 (×3): qty 0.1

## 2016-09-26 NOTE — Progress Notes (Addendum)
1318- recheck CBG was 398, Lantus 10 units given.  Dr Roderic Palau aware.   Verbal order for 20 Units for Novolog given.  1422- recheck CBG prior to novolog dose, 220.   Will check with Dr Roderic Palau prior to administering dose.  Per Dr Roderic Palau, dose not given.  Will continue to monitor pt.

## 2016-09-26 NOTE — Care Management Important Message (Signed)
Important Message  Patient Details  Name: Eileen [name] MRN: [id number] Date of Birth: 25-Mar-1945   Medicare Important Message Given:  Yes    Senie Lanese, Chauncey [place], RN 09/26/2016, 2:01 PM

## 2016-09-26 NOTE — Progress Notes (Signed)
Inpatient Diabetes Program Recommendations  AACE/ADA: New Consensus Statement on Inpatient Glycemic Control (2015)  Target Ranges:  Prepandial:   less than 140 mg/dL      Peak postprandial:   less than 180 mg/dL (1-2 hours)      Critically ill patients:  140 - 180 mg/dL   Results for [place], [name] (MRN [id number]) as of 09/26/2016 08:11  Ref. Range 09/25/2016 17:36 09/25/2016 20:49 09/26/2016 07:38  Glucose-Capillary Latest Ref Range: 65 - 99 mg/dL 294 (H) 248 (H) 240 (H)   Review of Glycemic Control  Diabetes history: No Outpatient Diabetes medications: NA Current orders for Inpatient glycemic control: Novolog 0-15 units TID with meals, Novolog 0-5 units QHS  Inpatient Diabetes Program Recommendations: Insulin - Basal: If steroids are continued, please consider ordering Lantus 5 units Q24H. Insulin - Meal Coverage: If steroids are continued, please consider ordeirng Novolog 3 units TID with meals for meal coverage if patient eats at least 50% of meals. HgbA1C: A1C in process.  Thanks, Barnie Alderman, RN, MSN, CDE Diabetes Coordinator Inpatient Diabetes Program [phone] (Team Pager from 8am to 5pm)

## 2016-09-26 NOTE — Progress Notes (Signed)
CRITICAL VALUE ALERT  Critical Value:  CBG 429  Date & Time Notied:  9201 09/26/2016  Provider Notified: Dr Roderic Palau at 1151 via AMION  Orders Received/Actions taken: Verbal order for 20 units of Novolog and to recheck CBG in an hour.  Modified pt diet to heart healthy/carb modified.  Dr Roderic Palau also to modify pt SSC.  Will continue to monitor pt.

## 2016-09-26 NOTE — Progress Notes (Signed)
PROGRESS NOTE    Eileen [name]  DJS:[id number] DOB: Mar 02, 1946 DOA: 09/22/2016 PCP: Lavella Lemons, PA   Brief Narrative:  71 year old female with a history of steroid-dependent COPD and chronic respiratory failure, presents to the hospital with COPD exacerbation. She was admitted for steroids, antibiotics and bronchodilators. She has been slow to improve. Pulmonology consulted. On 7/15 she developed rapid atrial fibrillation, but has since converted back to sinus rhythm. We'll continue to monitor on telemetry. Plan is to discharge home once respiratory status is approaching baseline.   Assessment & Plan:   Active Problems:   COPD exacerbation ([place])   Obesity   Essential hypertension   Anticoagulation management encounter   Acute on chronic respiratory failure (HCC)   Atrial flutter (HCC)   Atrial fibrillation with RVR ([place])   1. COPD exacerbation. She continues to desaturate, despite higher oxygen requirements. Continue on IV steroids, antibiotics. Continue pulmonary hygiene. Will request pulmonology assistance 2. Acute on chronic respiratory failure. Chronically on 2L oxygen. Not back to baseline yet. Continue current treatments. 3. Atrial fibrillation with RVR. Patient developed rapid atrial fibrillation on 7/15. She was treated with IV Cardizem and maintenance metoprolol dose was increased from 25 mg twice a day to 50 mg twice a day. She has since converted back to sinus rhythm. Continue to monitor She is anticoagulated with Xarelto. 4. Hypertension. Blood pressures stable. Continue to hold ARB/HCTZ 5. Hyperglycemia. Suspect this is related to steroids. Check blood sugars. Check A1c. Continue SSI and start lantus 6. Hemorrhoids. Patient describes noticing some bloods when wiping. She has been started on anusol suppositories with some improvement. Continue to monitor since she is on anticoagulation   DVT prophylaxis: xarelto Code Status: full code Family Communication:  discussed with daughter at the bedside Disposition Plan: discharge home once improved   Consultants:     Procedures:     Antimicrobials:   Azithromycin 7/13>>    Subjective: Has remained in sinus rhythm. Still feels short of breath. Requiring increased amount of oxygen and still desaturates on ambulation  Objective: Vitals:   09/26/16 0930 09/26/16 0935 09/26/16 0940 09/26/16 0947  BP:      Pulse:      Resp:      Temp:      TempSrc:      SpO2: 96% (!) 69% 91% 94%  Weight:      Height:        Intake/Output Summary (Last 24 hours) at 09/26/16 1223 Last data filed at 09/26/16 0944  Gross per 24 hour  Intake              480 ml  Output             2000 ml  Net            -1520 ml   Filed Weights   09/22/16 2210  Weight: 84.9 kg (187 lb 3.2 oz)    Examination:  General exam: Alert, awake, oriented x 3 Respiratory system: diminished breath sounds bilaterally. Mildly increased respiratory effort Cardiovascular system:RRR. No murmurs, rubs, gallops. Gastrointestinal system: Abdomen is nondistended, soft and nontender. No organomegaly or masses felt. Normal bowel sounds heard. Central nervous system: Alert and oriented. No focal neurological deficits. Extremities: No C/C/E, +pedal pulses Skin: No rashes, lesions or ulcers Psychiatry: Judgement and insight appear normal. Mood & affect appropriate.        Data Reviewed: I have personally reviewed following labs and imaging studies  CBC:  Recent Labs Lab 09/22/16  1824 09/23/16 0541 09/25/16 0553  WBC 13.3* 14.4* 19.5*  NEUTROABS 11.4*  --   --   HGB 14.7 13.5 12.8  HCT 46.4* 42.7 40.9  MCV 96.3 94.7 96.7  PLT 249 240 557   Basic Metabolic Panel:  Recent Labs Lab 09/22/16 1824 09/23/16 0541 09/25/16 0553  NA 138 140 136  K 4.6 4.7 4.8  CL 95* 97* 93*  CO2 34* 35* 36*  GLUCOSE 194* 173* 230*  BUN 17 14 21*  CREATININE 0.87 0.66 0.67  CALCIUM 10.1 9.6 9.0   GFR: Estimated Creatinine  Clearance: 66.6 mL/min (by C-G formula based on SCr of 0.67 mg/dL). Liver Function Tests:  Recent Labs Lab 09/22/16 1824 09/23/16 0541  AST 22 18  ALT 21 18  ALKPHOS 69 53  BILITOT 0.7 0.7  PROT 7.5 7.0  ALBUMIN 4.5 4.1   No results for input(s): LIPASE, AMYLASE in the last 168 hours. No results for input(s): AMMONIA in the last 168 hours. Coagulation Profile: No results for input(s): INR, PROTIME in the last 168 hours. Cardiac Enzymes:  Recent Labs Lab 09/22/16 1824  TROPONINI <0.03   BNP (last 3 results) No results for input(s): PROBNP in the last 8760 hours. HbA1C:  Recent Labs  09/25/16 0553  HGBA1C 7.2*   CBG:  Recent Labs Lab 09/25/16 1736 09/25/16 2049 09/26/16 0738 09/26/16 1146  GLUCAP 294* 248* 240* 429*   Lipid Profile: No results for input(s): CHOL, HDL, LDLCALC, TRIG, CHOLHDL, LDLDIRECT in the last 72 hours. Thyroid Function Tests: No results for input(s): TSH, T4TOTAL, FREET4, T3FREE, THYROIDAB in the last 72 hours. Anemia Panel: No results for input(s): VITAMINB12, FOLATE, FERRITIN, TIBC, IRON, RETICCTPCT in the last 72 hours. Sepsis Labs: No results for input(s): PROCALCITON, LATICACIDVEN in the last 168 hours.  No results found for this or any previous visit (from the past 240 hour(s)).       Radiology Studies: No results found.      Scheduled Meds: . azithromycin  500 mg Oral q1800  . diltiazem  240 mg Oral Daily  . guaiFENesin  1,200 mg Oral BID  . hydrocortisone  25 mg Rectal BID  . insulin aspart  0-20 Units Subcutaneous TID WC  . insulin aspart  0-5 Units Subcutaneous QHS  . insulin aspart  3 Units Subcutaneous TID WC  . insulin glargine  10 Units Subcutaneous Daily  . ipratropium  0.5 mg Nebulization Q6H  . levalbuterol  0.63 mg Nebulization Q6H  . methylPREDNISolone (SOLU-MEDROL) injection  60 mg Intravenous Q12H  . metoprolol tartrate  50 mg Oral BID  . mometasone-formoterol  2 puff Inhalation BID  . pantoprazole   40 mg Oral Q0600  . rivaroxaban  20 mg Oral Q supper  . vitamin C  1,000 mg Oral Daily  . Vitamin D (Ergocalciferol)  50,000 Units Oral Q30 days   Continuous Infusions:    LOS: 3 days    Time spent: 51mins    Darrel Gloss, MD [facility] Pager [phone] If 7PM-7AM, please contact night-coverage [email or web address] Password [facility] 09/26/2016, 12:23 PM

## 2016-09-26 NOTE — Care Management Note (Signed)
Case Management Note  Patient Details  Name: Eileen [name] MRN: [id number] Date of Birth: 10-04-1945  Subjective/Objective:      Adm with COPD exacerbation, chronic respiratory failure. Slow to improve. Has developed Rapid Afib while here. Per notes, pulmonolgy consulted. From home, ind PTA. Has continuous oxygen provided by [facility], had previously only been wearing at night. [facility] and is agreeable for [facility] transition calls. Patient is also interested in [facility] RN for f/u of improvement of COPD.             Action/Plan: [place] home with home health RN.    Expected Discharge Date:        09/27/2016          Expected Discharge Plan:  [place]  In-House Referral:     Discharge planning Services  CM Consult  Post Acute Care Choice:    Choice offered to:  Patient  DME Arranged:    DME Agency:     HH Arranged:  RN [place] Agency:  [place]  Status of Service:  Completed, signed off  If discussed at [place] of Stay Meetings, dates discussed:    Additional Comments:  Keimora Swartout, Chauncey [place], RN 09/26/2016, 1:51 PM

## 2016-09-27 DIAGNOSIS — I4891 Unspecified atrial fibrillation: Secondary | ICD-10-CM

## 2016-09-27 DIAGNOSIS — I10 Essential (primary) hypertension: Secondary | ICD-10-CM

## 2016-09-27 DIAGNOSIS — R739 Hyperglycemia, unspecified: Secondary | ICD-10-CM

## 2016-09-27 DIAGNOSIS — J9621 Acute and chronic respiratory failure with hypoxia: Secondary | ICD-10-CM

## 2016-09-27 DIAGNOSIS — J441 Chronic obstructive pulmonary disease with (acute) exacerbation: Principal | ICD-10-CM

## 2016-09-27 LAB — BASIC METABOLIC PANEL
Anion gap: 7 (ref 5–15)
BUN: 20 mg/dL (ref 6–20)
CO2: 41 mmol/L — ABNORMAL HIGH (ref 22–32)
Calcium: 9.3 mg/dL (ref 8.9–10.3)
Chloride: 91 mmol/L — ABNORMAL LOW (ref 101–111)
Creatinine, Ser: 0.72 mg/dL (ref 0.44–1.00)
GFR calc Af Amer: >60 mL/min (ref >60)
GFR calc non Af Amer: >60 mL/min (ref >60)
Glucose, Bld: 195 mg/dL — ABNORMAL HIGH (ref 65–99)
Potassium: 4.8 mmol/L (ref 3.5–5.1)
Sodium: 139 mmol/L (ref 135–145)

## 2016-09-27 LAB — CBC
HCT: 42.2 % (ref 36.0–46.0)
Hemoglobin: 13 g/dL (ref 12.0–15.0)
MCH: 29.9 pg (ref 26.0–34.0)
MCHC: 30.8 g/dL (ref 30.0–36.0)
MCV: 97 fL (ref 78.0–100.0)
Platelets: 236 10*3/uL (ref 150–400)
RBC: 4.35 MIL/uL (ref 3.87–5.11)
RDW: 13.8 % (ref 11.5–15.5)
WBC: 14.2 10*3/uL — ABNORMAL HIGH (ref 4.0–10.5)

## 2016-09-27 LAB — GLUCOSE, CAPILLARY
Glucose-Capillary: 215 mg/dL — ABNORMAL HIGH (ref 65–99)
Glucose-Capillary: 257 mg/dL — ABNORMAL HIGH (ref 65–99)
Glucose-Capillary: 174 mg/dL — ABNORMAL HIGH (ref 65–99)
Glucose-Capillary: 180 mg/dL — ABNORMAL HIGH (ref 65–99)

## 2016-09-27 MED ORDER — INSULIN GLARGINE 100 UNIT/ML ~~LOC~~ SOLN
13.0000 [IU] | Freq: Every day | SUBCUTANEOUS | Status: DC
  Administered 2016-09-28 – 2016-09-29 (×2): 13 [IU] via SUBCUTANEOUS
  Filled 2016-09-27 (×5): qty 0.13

## 2016-09-27 MED ORDER — INSULIN ASPART 100 UNIT/ML ~~LOC~~ SOLN
5.0000 [IU] | Freq: Three times a day (TID) | SUBCUTANEOUS | Status: DC
  Administered 2016-09-27 – 2016-09-29 (×5): 5 [IU] via SUBCUTANEOUS

## 2016-09-27 NOTE — Consult Note (Signed)
Consult requested by: [facility] Consult requested for: COPD exacerbation  HPI: This is a 71 year old who has significant COPD at baseline. She also has atrial fib on chronic anticoagulation chronic hypoxic respiratory failure on home oxygen hypertension history of breast cancer. She started an exacerbation about 2-3 weeks prior to admission she was given prednisone and Levaquin and improved but then she had more trouble. She came to the emergency room and was given treatments but when she ambulated her oxygen saturation dropped into the 70s and she became more short of breath. She's not having any chest pain. She had a little bit of swelling of her legs. She's coughing some and coughing up a little bit of sputum. Her blood sugar has not been controlled probably because of steroids. She has no other new complaints. No joint pain. No nausea vomiting. No headache. No fever or chills.  Past Medical History:  Diagnosis Date  . Asthma   . COPD (chronic obstructive pulmonary disease) ([place])   . History of breast cancer    right breast  . Hypertension      Family History  Problem Relation Age of Onset  . Cancer Mother        lung  . Diabetes Brother      Social History   Social History  . Marital status: Single    Spouse name: N/A  . Number of children: N/A  . Years of education: N/A   Social History Main Topics  . Smoking status: Former Smoker    Packs/day: 0.50    Years: 32.00    Types: Cigarettes    Start date: 12/12/1962    Quit date: 03/13/1994  . Smokeless tobacco: Never Used  . Alcohol use No  . Drug use: No  . Sexual activity: Not Asked   Other Topics Concern  . None   Social History Narrative  . None     ROS: Except as mentioned 10 point review of systems is negative    Objective: Vital signs in last 24 hours: Temp:  [97.8 F (36.6 C)-98.1 F (36.7 C)] 97.8 F (36.6 C) (07/18 0544) Pulse Rate:  [70-86] 70 (07/18 0544) Resp:  [20] 20 (07/18 0544) BP:  (118-123)/(64-66) 123/64 (07/18 0544) SpO2:  [69 %-98 %] 98 % (07/18 0727) Weight change:  Last BM Date: 09/22/16  Intake/Output from previous day: 07/17 0701 - 07/18 0700 In: 720 [P.O.:720] Out: 1900 [Urine:1900]  PHYSICAL EXAM Constitutional: She is awake and alert and in no acute distress. She is wearing nasal oxygen. Eyes: Pupils react EOMI. Ears nose mouth and throat: Her mucous membranes are moist. Hearing is grossly normal. Throat is clear. Cardiovascular: Her heart is not regular and shows irregularly irregular pattern. She has trace edema of her legs. Respiratory: Her respiratory effort is normal. She is not wheezing now but does have prolonged expiratory phase. Gastrointestinal: Her abdomen is soft with no masses. Skin: Warm and dry. Musculoskeletal: Normal strength in all 4 extremities. Neurological: No focal abnormalities. Psychiatric: Normal mood and affect.  Lab Results: Basic Metabolic Panel:  Recent Labs  09/25/16 0553 09/27/16 0459  NA 136 139  K 4.8 4.8  CL 93* 91*  CO2 36* 41*  GLUCOSE 230* 195*  BUN 21* 20  CREATININE 0.67 0.72  CALCIUM 9.0 9.3   Liver Function Tests: No results for input(s): AST, ALT, ALKPHOS, BILITOT, PROT, ALBUMIN in the last 72 hours. No results for input(s): LIPASE, AMYLASE in the last 72 hours. No results for input(s): AMMONIA  in the last 72 hours. CBC:  Recent Labs  09/25/16 0553 09/27/16 0459  WBC 19.5* 14.2*  HGB 12.8 13.0  HCT 40.9 42.2  MCV 96.7 97.0  PLT 248 236   Cardiac Enzymes: No results for input(s): CKTOTAL, CKMB, CKMBINDEX, TROPONINI in the last 72 hours. BNP: No results for input(s): PROBNP in the last 72 hours. D-Dimer: No results for input(s): DDIMER in the last 72 hours. CBG:  Recent Labs  09/26/16 1146 09/26/16 1318 09/26/16 1422 09/26/16 1620 09/26/16 2115 09/27/16 0740  GLUCAP 429* 398* 220* 135* 225* 215*   Hemoglobin A1C:  Recent Labs  09/25/16 0553  HGBA1C 7.2*   Fasting Lipid  Panel: No results for input(s): CHOL, HDL, LDLCALC, TRIG, CHOLHDL, LDLDIRECT in the last 72 hours. Thyroid Function Tests: No results for input(s): TSH, T4TOTAL, FREET4, T3FREE, THYROIDAB in the last 72 hours. Anemia Panel: No results for input(s): VITAMINB12, FOLATE, FERRITIN, TIBC, IRON, RETICCTPCT in the last 72 hours. Coagulation: No results for input(s): LABPROT, INR in the last 72 hours. Urine Drug Screen: Drugs of Abuse  No results found for: LABOPIA, COCAINSCRNUR, LABBENZ, AMPHETMU, THCU, LABBARB  Alcohol Level: No results for input(s): ETH in the last 72 hours. Urinalysis: No results for input(s): COLORURINE, LABSPEC, PHURINE, GLUCOSEU, HGBUR, BILIRUBINUR, KETONESUR, PROTEINUR, UROBILINOGEN, NITRITE, LEUKOCYTESUR in the last 72 hours.  Invalid input(s): APPERANCEUR Misc. Labs:   ABGS: No results for input(s): PHART, PO2ART, TCO2, HCO3 in the last 72 hours.  Invalid input(s): PCO2   MICROBIOLOGY: No results found for this or any previous visit (from the past 240 hour(s)).  Studies/Results: No results found.  Medications:  Prior to Admission:  Prescriptions Prior to Admission  Medication Sig Dispense Refill Last Dose  . acetaminophen (TYLENOL) 500 MG tablet Take 500 mg by mouth every other day.   09/22/2016 at Unknown time  . albuterol (PROVENTIL) (2.5 MG/3ML) 0.083% nebulizer solution Take 2.5 mg by nebulization every 6 (six) hours as needed for wheezing or shortness of breath.   09/22/2016 at Unknown time  . ANORO ELLIPTA 62.5-25 MCG/INH AEPB INHALE ONE PUFF DAILY  6 09/20/2016 at Unknown time  . Ascorbic Acid (VITAMIN C PO) Take 1 tablet by mouth daily.   09/22/2016 at Unknown time  . diltiazem (CARDIZEM CD) 240 MG 24 hr capsule TAKE ONE CAPSULE BY MOUTH DAILY. 30 capsule 6 09/22/2016 at Unknown time  . furosemide (LASIX) 40 MG tablet Take 1 tablet by mouth daily as needed for fluid or edema.   5 unknown  . metoprolol succinate (TOPROL-XL) 25 MG 24 hr tablet Take 12.5  mg by mouth daily.  1 09/22/2016 at [place]  . Multiple Vitamin (MULTIVITAMIN WITH MINERALS) TABS tablet Take 1 tablet by mouth every other day.   Past Week at Unknown time  . OXYGEN Inhale 2 L into the lungs daily. At night and daily as needed   09/22/2016 at Unknown time  . predniSONE (DELTASONE) 5 MG tablet Take 10 mg by mouth daily.   3 09/22/2016 at Unknown time  . PROAIR HFA 108 (90 BASE) MCG/ACT inhaler Inhale 2 puffs into the lungs every 6 (six) hours as needed for wheezing or shortness of breath.    unknown  . telmisartan-hydrochlorothiazide (MICARDIS HCT) 80-12.5 MG tablet Take 0.5 tablets by mouth daily.  1 09/22/2016 at Unknown time  . Vitamin D, Ergocalciferol, (DRISDOL) 50000 units CAPS capsule Take 50,000 Units by mouth every 30 (thirty) days.   07/2016  . XARELTO 20 MG TABS tablet TAKE ONE  TABLET BY MOUTH DAILY WITH SUPPER. 30 tablet 6 09/21/2016 at 2100   Scheduled: . azithromycin  500 mg Oral q1800  . diltiazem  240 mg Oral Daily  . guaiFENesin  1,200 mg Oral BID  . hydrocortisone  25 mg Rectal BID  . insulin aspart  0-20 Units Subcutaneous TID WC  . insulin aspart  0-5 Units Subcutaneous QHS  . insulin aspart  20 Units Subcutaneous Once  . insulin aspart  3 Units Subcutaneous TID WC  . insulin glargine  10 Units Subcutaneous Daily  . ipratropium  0.5 mg Nebulization Q6H  . levalbuterol  0.63 mg Nebulization Q6H  . methylPREDNISolone (SOLU-MEDROL) injection  60 mg Intravenous Q12H  . metoprolol tartrate  50 mg Oral BID  . mometasone-formoterol  2 puff Inhalation BID  . pantoprazole  40 mg Oral Q0600  . rivaroxaban  20 mg Oral Q supper  . vitamin C  1,000 mg Oral Daily  . Vitamin D (Ergocalciferol)  50,000 Units Oral Q30 days   Continuous:  OIP:[id number] **OR** acetaminophen, furosemide, polyethylene glycol  Assesment: She was admitted with COPD exacerbation and has had problems with hypoxic respiratory failure. She seems to be doing a little bit better now. I would plan  to continue steroids antibiotics. She is on nebulizer treatments as well. Active Problems:   COPD exacerbation ([place])   Obesity   Essential hypertension   Anticoagulation management encounter   Acute on chronic respiratory failure (HCC)   Atrial flutter (H[facility])   Atrial fibrillation with RVR ([place])    Plan: I would continue IV steroids today. Probably switch back to prednisone tomorrow if she continues to improve.    LOS: 4 days   Ellard Nan L 09/27/2016, 8:55 AM

## 2016-09-27 NOTE — Progress Notes (Signed)
Inpatient Diabetes Program Recommendations  AACE/ADA: New Consensus Statement on Inpatient Glycemic Control (2015)  Target Ranges:  Prepandial:   less than 140 mg/dL      Peak postprandial:   less than 180 mg/dL (1-2 hours)      Critically ill patients:  140 - 180 mg/dL  Results for Eileen [name], Eileen [name] (MRN [id number]) as of 09/27/2016 10:08  Ref. Range 09/26/2016 07:38 09/26/2016 11:46 09/26/2016 13:18 09/26/2016 14:22 09/26/2016 16:20 09/26/2016 21:15 09/27/2016 07:40  Glucose-Capillary Latest Ref Range: 65 - 99 mg/dL 240 (H) 429 (H) 398 (H) 220 (H) 135 (H) 225 (H) 215 (H)   Results for Eileen [name], Eileen [name] (MRN [id number]) as of 09/27/2016 10:08  Ref. Range 09/25/2016 05:53  Hemoglobin A1C Latest Ref Range: 4.8 - 5.6 % 7.2 (H)   Review of Glycemic Control  Diabetes history: No Outpatient Diabetes medications: NA Current orders for Inpatient glycemic control: Lantus 10 units daily, Novolog 0-20 units TID with meals, Novolog 0-5 units QHS, Novolog 3 units TID with meals  Inpatient Diabetes Program Recommendations: Insulin - Basal: Please consider increasing Lantus to 13 units daily. Insulin - Meal Coverage: Please consider increasing meal coverage to Novolog 5 units TID with meals. HgbA1C: A1C 7.2% on 09/25/16. Per ADA criteria, if A1C 6.5% or greater criteria for DM dx is met. Noted patient  takes Prednisone 10 mg daily as an outpatient which is contributing to hyperglycemia. MD, please make reference in progress note if patient will be newly dx with DM. If so, please inform patient and nursing staff so patient can be educated on DM.  Thanks, Barnie Alderman, RN, MSN, CDE Diabetes Coordinator Inpatient Diabetes Program [phone] (Team Pager from 8am to 5pm)

## 2016-09-27 NOTE — Progress Notes (Signed)
PROGRESS NOTE    [name]  KJZ:[id number] DOB: 01/10/1946 DOA: 09/22/2016 PCP: Lavella Lemons, PA    Brief Narrative:  71 year old female who presented with dyspnea. Patient is known to have atrial fibrillation, COPD on home oxygen, hypertension, history of breast cancer. Worsening symptoms for last 2-3 weeks prior to hospitalization, dyspnea associated with wheezing. She was noted to have desaturation down to 70% on ambulation. On his initial physical examination, heart rate 92, respiratory rate 18, oxygen saturation 95%. Moist mucous membranes, lungs with no significant wheezing, rales or rhonchi, heart S1-S2 present rhythmic, abdomen soft nontender, no lower extremity edema.   She was admitted to the hospital working diagnosis of acute on chronic hypoxic respiratory failure due to COPD exacerbation.   Assessment & Plan:   Active Problems:   COPD exacerbation ([place])   Obesity   Essential hypertension   Anticoagulation management encounter   Acute on chronic respiratory failure (H[facility])   Atrial flutter (HCC)   Atrial fibrillation with RVR ([place])  1. COPD exacerbation. Will continue bronchodilator therapy and systemic steroids, will continue with oxymetry monitoring and supplemental 02 per [place]. Macrolide with azithromycin po   2. Atrial fibrillation. Will continue rate control with diltiazem and metoprolol, continue anticoagulation with rivaroxaban.   3. Hypertension. Blood pressure well controlled, with systolic 99 to 948, will continue metoprolol and diltiazem   4. Hyperglycemia. Capillary glucose 220, 135, 225, 215, 257. Will continue insulin sliding scale, on basal insulin glargine 10 units per day, will taper steroids as tolerated. Follow Hb a1c      DVT prophylaxis: enoxaparin  Code Status: full  Family Communication: I spoke with patient's family at the bedside and all questions were addressed.  Disposition Plan: home   Consultants:   Pulmonary   Procedures:     Antimicrobials:    Subjective: Patient with improved dyspnea, no chest pain, no nausea or vomiting. No fever or chills.   Objective: Vitals:   09/27/16 0141 09/27/16 0544 09/27/16 0722 09/27/16 0727  BP:  123/64    Pulse:  70    Resp:  20    Temp:  97.8 F (36.6 C)    TempSrc:  Oral    SpO2: 92% 93% 98% 98%  Weight:      Height:        Intake/Output Summary (Last 24 hours) at 09/27/16 1147 Last data filed at 09/26/16 1800  Gross per 24 hour  Intake              480 ml  Output             1100 ml  Net             -620 ml   Filed Weights   09/22/16 2210  Weight: 84.9 kg (187 lb 3.2 oz)    Examination:  General exam: deconditioned E ENT: no pallor or icterus, oral mucosa moist.  Respiratory system: decreased breath sounds bilaterally, with scattered rhonchi, no wheezing, or rales. Respiratory effort normal. Cardiovascular system: S1 & S2 heard, RRR. No JVD, murmurs, rubs, gallops or clicks. No pedal edema. Gastrointestinal system: Abdomen is nondistended, soft and nontender. No organomegaly or masses felt. Normal bowel sounds heard. Central nervous system: Alert and oriented. No focal neurological deficits. Extremities: Symmetric 5 x 5 power. Skin: No rashes, lesions or ulcers     Data Reviewed: I have personally reviewed following labs and imaging studies  CBC:  Recent Labs Lab 09/22/16 1824 09/23/16 0541 09/25/16 0165  09/27/16 0459  WBC 13.3* 14.4* 19.5* 14.2*  NEUTROABS 11.4*  --   --   --   HGB 14.7 13.5 12.8 13.0  HCT 46.4* 42.7 40.9 42.2  MCV 96.3 94.7 96.7 97.0  PLT 249 240 248 505   Basic Metabolic Panel:  Recent Labs Lab 09/22/16 1824 09/23/16 0541 09/25/16 0553 09/27/16 0459  NA 138 140 136 139  K 4.6 4.7 4.8 4.8  CL 95* 97* 93* 91*  CO2 34* 35* 36* 41*  GLUCOSE 194* 173* 230* 195*  BUN 17 14 21* 20  CREATININE 0.87 0.66 0.67 0.72  CALCIUM 10.1 9.6 9.0 9.3   GFR: Estimated Creatinine Clearance: 66.6 mL/min (by C-G formula  based on SCr of 0.72 mg/dL). Liver Function Tests:  Recent Labs Lab 09/22/16 1824 09/23/16 0541  AST 22 18  ALT 21 18  ALKPHOS 69 53  BILITOT 0.7 0.7  PROT 7.5 7.0  ALBUMIN 4.5 4.1   No results for input(s): LIPASE, AMYLASE in the last 168 hours. No results for input(s): AMMONIA in the last 168 hours. Coagulation Profile: No results for input(s): INR, PROTIME in the last 168 hours. Cardiac Enzymes:  Recent Labs Lab 09/22/16 1824  TROPONINI <0.03   BNP (last 3 results) No results for input(s): PROBNP in the last 8760 hours. HbA1C:  Recent Labs  09/25/16 0553  HGBA1C 7.2*   CBG:  Recent Labs Lab 09/26/16 1422 09/26/16 1620 09/26/16 2115 09/27/16 0740 09/27/16 1110  GLUCAP 220* 135* 225* 215* 257*   Lipid Profile: No results for input(s): CHOL, HDL, LDLCALC, TRIG, CHOLHDL, LDLDIRECT in the last 72 hours. Thyroid Function Tests: No results for input(s): TSH, T4TOTAL, FREET4, T3FREE, THYROIDAB in the last 72 hours. Anemia Panel: No results for input(s): VITAMINB12, FOLATE, FERRITIN, TIBC, IRON, RETICCTPCT in the last 72 hours. Sepsis Labs: No results for input(s): PROCALCITON, LATICACIDVEN in the last 168 hours.  No results found for this or any previous visit (from the past 240 hour(s)).       Radiology Studies: No results found.      Scheduled Meds: . azithromycin  500 mg Oral q1800  . diltiazem  240 mg Oral Daily  . guaiFENesin  1,200 mg Oral BID  . hydrocortisone  25 mg Rectal BID  . insulin aspart  0-20 Units Subcutaneous TID WC  . insulin aspart  0-5 Units Subcutaneous QHS  . insulin aspart  20 Units Subcutaneous Once  . insulin aspart  3 Units Subcutaneous TID WC  . insulin glargine  10 Units Subcutaneous Daily  . ipratropium  0.5 mg Nebulization Q6H  . levalbuterol  0.63 mg Nebulization Q6H  . methylPREDNISolone (SOLU-MEDROL) injection  60 mg Intravenous Q12H  . metoprolol tartrate  50 mg Oral BID  . mometasone-formoterol  2 puff  Inhalation BID  . pantoprazole  40 mg Oral Q0600  . rivaroxaban  20 mg Oral Q supper  . vitamin C  1,000 mg Oral Daily  . Vitamin D (Ergocalciferol)  50,000 Units Oral Q30 days   Continuous Infusions:   LOS: 4 days        Nanea Jared Gerome Apley, MD [facility] Pager [phone]  If 7PM-7AM, please contact night-coverage [email or web address] Password [facility] 09/27/2016, 11:47 AM

## 2016-09-28 DIAGNOSIS — E119 Type 2 diabetes mellitus without complications: Secondary | ICD-10-CM

## 2016-09-28 DIAGNOSIS — J9622 Acute and chronic respiratory failure with hypercapnia: Secondary | ICD-10-CM

## 2016-09-28 LAB — GLUCOSE, CAPILLARY
GLUCOSE-CAPILLARY: 182 mg/dL — AB (ref 65–99)
GLUCOSE-CAPILLARY: 246 mg/dL — AB (ref 65–99)
Glucose-Capillary: 292 mg/dL — ABNORMAL HIGH (ref 65–99)
Glucose-Capillary: 235 mg/dL — ABNORMAL HIGH (ref 65–99)

## 2016-09-28 LAB — BASIC METABOLIC PANEL
Anion gap: 9 (ref 5–15)
BUN: 23 mg/dL — AB (ref 6–20)
CHLORIDE: 90 mmol/L — AB (ref 101–111)
CO2: 41 mmol/L — AB (ref 22–32)
CREATININE: 0.72 mg/dL (ref 0.44–1.00)
Calcium: 9.5 mg/dL (ref 8.9–10.3)
GFR calc Af Amer: >60 mL/min (ref >60)
GFR calc non Af Amer: >60 mL/min (ref >60)
GLUCOSE: 193 mg/dL — AB (ref 65–99)
POTASSIUM: 4.8 mmol/L (ref 3.5–5.1)
SODIUM: 140 mmol/L (ref 135–145)

## 2016-09-28 MED ORDER — INSULIN GLARGINE 100 UNIT/ML ~~LOC~~ SOLN: 5.0000 [IU] | Freq: Every day | SUBCUTANEOUS | Status: DC

## 2016-09-28 NOTE — Progress Notes (Addendum)
PROGRESS NOTE    Eileen [name]  PHX:[id number] DOB: [birth year] DOA: 09/22/2016 PCP: Lavella Lemons, PA    Brief Narrative:  71 year old female who presented with dyspnea. Patient is known to have atrial fibrillation, COPD on home oxygen, hypertension, history of breast cancer. Worsening symptoms for last 2-3 weeks prior to hospitalization, dyspnea associated with wheezing. She was noted to have desaturation down to 70% on ambulation. On his initial physical examination, heart rate 92, respiratory rate 18, oxygen saturation 95%. Moist mucous membranes, lungs with no significant wheezing, rales or rhonchi, heart S1-S2 present rhythmic, abdomen soft nontender, no lower extremity edema.   She was admitted to the hospital working diagnosis of acute on chronic hypoxic respiratory failure due to COPD exacerbation.   Assessment & Plan:   Active Problems:   COPD exacerbation ([place])   Obesity   Essential hypertension   Anticoagulation management encounter   Acute on chronic respiratory failure (HCC)   Atrial flutter (HCC)   Atrial fibrillation with RVR ([place])   1. COPD exacerbation. On bronchodilator therapy and systemic steroids, with methylprednisolone  60 mg iv q12, continue with oxymetry monitoring and supplemental 02 per [place]. Macrolide with azithromycin po. Patient with positive desaturation in ambulation, still symptomatic. Mentioned worsening symptoms and cough with feedings, will order a bedside swallow evaluation.    2. Atrial fibrillation. Rate control with diltiazem and metoprolol, heart rate 68 to 88. Will continue anticoagulation with rivaroxaban, with good toleration.   3. Hypertension. Systolic blood pressure 016 to 130, tolerating well metoprolol and diltiazem   4. DMT2 steroid induced with Hyperglycemia. Capillary glucose 174, 180, 182, 292. Taper steroids slowly, will continue to use sliding scale for glucose cover and monitoring. Noted elevated Hba1c, up to 7,2. cw with  diabetes type 2. Continue basal insulin with 13 units of glargine and 5 units pre meal.    DVT prophylaxis: enoxaparin  Code Status: full  Family Communication: I spoke with patient's family at the bedside and all questions were addressed.  Disposition Plan: home   Consultants:   Pulmonary   Procedures:    Antimicrobials:    Subjective: Patient with persistent dyspnea, worse with exertion, associated with ambulatory oxygen desaturation. Noted coughing while eating, no nausea or vomiting, no chest pain or lower extremity edema.   Objective: Vitals:   09/28/16 0205 09/28/16 0433 09/28/16 0825 09/28/16 0836  BP:  106/80    Pulse:  68    Resp:  18    Temp:  97.8 F (36.6 C)    TempSrc:  Oral    SpO2: 92% 94% (!) 84% (!) 84%  Weight:      Height:        Intake/Output Summary (Last 24 hours) at 09/28/16 1048 Last data filed at 09/27/16 1752  Gross per 24 hour  Intake                0 ml  Output             1200 ml  Net            -1200 ml   Filed Weights   09/22/16 2210  Weight: 84.9 kg (187 lb 3.2 oz)    Examination:  General exam: deconditioned and ill looking appearing E ENT: mild pallor, no icterus, oral mucosa moist.  Respiratory system: Bilateral decreased breath sounds bilaterally, no wheezing, no rhonchi, positive scattered rales. Cardiovascular system: S1 & S2 heard, RRR. No JVD, murmurs, rubs, gallops or clicks. No pedal  edema. Gastrointestinal system: Abdomen is nondistended, soft and nontender. No organomegaly or masses felt. Normal bowel sounds heard. Central nervous system: Alert and oriented. No focal neurological deficits. Extremities: Symmetric 5 x 5 power. Skin: No rashes, lesions or ulcers   Data Reviewed: I have personally reviewed following labs and imaging studies  CBC:  Recent Labs Lab 09/22/16 1824 09/23/16 0541 09/25/16 0553 09/27/16 0459  WBC 13.3* 14.4* 19.5* 14.2*  NEUTROABS 11.4*  --   --   --   HGB 14.7 13.5 12.8  13.0  HCT 46.4* 42.7 40.9 42.2  MCV 96.3 94.7 96.7 97.0  PLT 249 240 248 409   Basic Metabolic Panel:  Recent Labs Lab 09/22/16 1824 09/23/16 0541 09/25/16 0553 09/27/16 0459 09/28/16 0503  NA 138 140 136 139 140  K 4.6 4.7 4.8 4.8 4.8  CL 95* 97* 93* 91* 90*  CO2 34* 35* 36* 41* 41*  GLUCOSE 194* 173* 230* 195* 193*  BUN 17 14 21* 20 23*  CREATININE 0.87 0.66 0.67 0.72 0.72  CALCIUM 10.1 9.6 9.0 9.3 9.5   GFR: Estimated Creatinine Clearance: 66.6 mL/min (by C-G formula based on SCr of 0.72 mg/dL). Liver Function Tests:  Recent Labs Lab 09/22/16 1824 09/23/16 0541  AST 22 18  ALT 21 18  ALKPHOS 69 53  BILITOT 0.7 0.7  PROT 7.5 7.0  ALBUMIN 4.5 4.1   No results for input(s): LIPASE, AMYLASE in the last 168 hours. No results for input(s): AMMONIA in the last 168 hours. Coagulation Profile: No results for input(s): INR, PROTIME in the last 168 hours. Cardiac Enzymes:  Recent Labs Lab 09/22/16 1824  TROPONINI <0.03   BNP (last 3 results) No results for input(s): PROBNP in the last 8760 hours. HbA1C: No results for input(s): HGBA1C in the last 72 hours. CBG:  Recent Labs Lab 09/27/16 0740 09/27/16 1110 09/27/16 1630 09/27/16 2109 09/28/16 0750  GLUCAP 215* 257* 174* 180* 182*   Lipid Profile: No results for input(s): CHOL, HDL, LDLCALC, TRIG, CHOLHDL, LDLDIRECT in the last 72 hours. Thyroid Function Tests: No results for input(s): TSH, T4TOTAL, FREET4, T3FREE, THYROIDAB in the last 72 hours. Anemia Panel: No results for input(s): VITAMINB12, FOLATE, FERRITIN, TIBC, IRON, RETICCTPCT in the last 72 hours. Sepsis Labs: No results for input(s): PROCALCITON, LATICACIDVEN in the last 168 hours.  No results found for this or any previous visit (from the past 240 hour(s)).       Radiology Studies: No results found.      Scheduled Meds: . azithromycin  500 mg Oral q1800  . diltiazem  240 mg Oral Daily  . guaiFENesin  1,200 mg Oral BID  .  hydrocortisone  25 mg Rectal BID  . insulin aspart  0-20 Units Subcutaneous TID WC  . insulin aspart  0-5 Units Subcutaneous QHS  . insulin aspart  20 Units Subcutaneous Once  . insulin aspart  5 Units Subcutaneous TID WC  . insulin glargine  13 Units Subcutaneous Daily  . ipratropium  0.5 mg Nebulization Q6H  . levalbuterol  0.63 mg Nebulization Q6H  . methylPREDNISolone (SOLU-MEDROL) injection  60 mg Intravenous Q12H  . metoprolol tartrate  50 mg Oral BID  . mometasone-formoterol  2 puff Inhalation BID  . pantoprazole  40 mg Oral Q0600  . rivaroxaban  20 mg Oral Q supper  . vitamin C  1,000 mg Oral Daily  . Vitamin D (Ergocalciferol)  50,000 Units Oral Q30 days   Continuous Infusions:   LOS: 5 days  Chae Shuster Gerome Apley, MD [facility] Pager [phone]  If 7PM-7AM, please contact night-coverage [email or web address] Password [facility] 09/28/2016, 10:48 AM

## 2016-09-28 NOTE — Progress Notes (Signed)
Subjective: She is still having trouble. When she was walking yesterday her oxygen saturation dropped. This morning on 2 L of oxygen when she had a coughing episode her oxygen saturation dropped down to 83. She is still wheezing. She's coughing and sneezing.  Objective: Vital signs in last 24 hours: Temp:  [97.8 F (36.6 C)-98.1 F (36.7 C)] 97.8 F (36.6 C) (07/19 0433) Pulse Rate:  [68-87] 68 (07/19 0433) Resp:  [18] 18 (07/19 0433) BP: (106-130)/(77-80) 106/80 (07/19 0433) SpO2:  [84 %-94 %] 84 % (07/19 0836) Weight change:  Last BM Date: 09/27/16  Intake/Output from previous day: 07/18 0701 - 07/19 0700 In: -  Out: 1200 [Urine:1200]  PHYSICAL EXAM General appearance: alert, cooperative and no distress Resp: clear to auscultation bilaterally Cardio: regular rate and rhythm, S1, S2 normal, no murmur, click, rub or gallop GI: soft, non-tender; bowel sounds normal; no masses,  no organomegaly Extremities: extremities normal, atraumatic, no cyanosis or edema I put that her chest was clear but she is actually wheezing a little bit. Her skin is warm and dry  Lab Results:  Results for orders placed or performed during the hospital encounter of 09/22/16 (from the past 48 hour(s))  Glucose, capillary     Status: Abnormal   Collection Time: 09/26/16 11:46 AM  Result Value Ref Range   Glucose-Capillary 429 (H) 65 - 99 mg/dL   Comment 1 Notify RN    Comment 2 Document in Chart   Glucose, capillary     Status: Abnormal   Collection Time: 09/26/16  1:18 PM  Result Value Ref Range   Glucose-Capillary 398 (H) 65 - 99 mg/dL  Glucose, capillary     Status: Abnormal   Collection Time: 09/26/16  2:22 PM  Result Value Ref Range   Glucose-Capillary 220 (H) 65 - 99 mg/dL  Glucose, capillary     Status: Abnormal   Collection Time: 09/26/16  4:20 PM  Result Value Ref Range   Glucose-Capillary 135 (H) 65 - 99 mg/dL   Comment 1 Notify RN    Comment 2 Document in Chart   Glucose, capillary      Status: Abnormal   Collection Time: 09/26/16  9:15 PM  Result Value Ref Range   Glucose-Capillary 225 (H) 65 - 99 mg/dL   Comment 1 Notify RN    Comment 2 Document in Chart   Basic metabolic panel     Status: Abnormal   Collection Time: 09/27/16  4:59 AM  Result Value Ref Range   Sodium 139 135 - 145 mmol/L   Potassium 4.8 3.5 - 5.1 mmol/L   Chloride 91 (L) 101 - 111 mmol/L   CO2 41 (H) 22 - 32 mmol/L   Glucose, Bld 195 (H) 65 - 99 mg/dL   BUN 20 6 - 20 mg/dL   Creatinine, Ser 0.72 0.44 - 1.00 mg/dL   Calcium 9.3 8.9 - 10.3 mg/dL   GFR calc non Af Amer >60 >60 mL/min   GFR calc Af Amer >60 >60 mL/min    Comment: (NOTE) The eGFR has been calculated using the CKD EPI equation. This calculation has not been validated in all clinical situations. eGFR's persistently <60 mL/min signify possible Chronic Kidney Disease.    Anion gap 7 5 - 15  CBC     Status: Abnormal   Collection Time: 09/27/16  4:59 AM  Result Value Ref Range   WBC 14.2 (H) 4.0 - 10.5 K/uL   RBC 4.35 3.87 - 5.11 MIL/uL  Hemoglobin 13.0 12.0 - 15.0 g/dL   HCT 42.2 36.0 - 46.0 %   MCV 97.0 78.0 - 100.0 fL   MCH 29.9 26.0 - 34.0 pg   MCHC 30.8 30.0 - 36.0 g/dL   RDW 13.8 11.5 - 15.5 %   Platelets 236 150 - 400 K/uL  Glucose, capillary     Status: Abnormal   Collection Time: 09/27/16  7:40 AM  Result Value Ref Range   Glucose-Capillary 215 (H) 65 - 99 mg/dL   Comment 1 Notify RN    Comment 2 Document in Chart   Glucose, capillary     Status: Abnormal   Collection Time: 09/27/16 11:10 AM  Result Value Ref Range   Glucose-Capillary 257 (H) 65 - 99 mg/dL   Comment 1 Notify RN    Comment 2 Document in Chart   Glucose, capillary     Status: Abnormal   Collection Time: 09/27/16  4:30 PM  Result Value Ref Range   Glucose-Capillary 174 (H) 65 - 99 mg/dL   Comment 1 Notify RN    Comment 2 Document in Chart   Glucose, capillary     Status: Abnormal   Collection Time: 09/27/16  9:09 PM  Result Value Ref Range    Glucose-Capillary 180 (H) 65 - 99 mg/dL   Comment 1 Notify RN    Comment 2 Document in Chart   Basic metabolic panel     Status: Abnormal   Collection Time: 09/28/16  5:03 AM  Result Value Ref Range   Sodium 140 135 - 145 mmol/L   Potassium 4.8 3.5 - 5.1 mmol/L   Chloride 90 (L) 101 - 111 mmol/L   CO2 41 (H) 22 - 32 mmol/L   Glucose, Bld 193 (H) 65 - 99 mg/dL   BUN 23 (H) 6 - 20 mg/dL   Creatinine, Ser 0.72 0.44 - 1.00 mg/dL   Calcium 9.5 8.9 - 10.3 mg/dL   GFR calc non Af Amer >60 >60 mL/min   GFR calc Af Amer >60 >60 mL/min    Comment: (NOTE) The eGFR has been calculated using the CKD EPI equation. This calculation has not been validated in all clinical situations. eGFR's persistently <60 mL/min signify possible Chronic Kidney Disease.    Anion gap 9 5 - 15  Glucose, capillary     Status: Abnormal   Collection Time: 09/28/16  7:50 AM  Result Value Ref Range   Glucose-Capillary 182 (H) 65 - 99 mg/dL   Comment 1 Notify RN    Comment 2 Document in Chart     ABGS No results for input(s): PHART, PO2ART, TCO2, HCO3 in the last 72 hours.  Invalid input(s): PCO2 CULTURES No results found for this or any previous visit (from the past 240 hour(s)). Studies/Results: No results found.  Medications:  Prior to Admission:  Prescriptions Prior to Admission  Medication Sig Dispense Refill Last Dose  . acetaminophen (TYLENOL) 500 MG tablet Take 500 mg by mouth every other day.   09/22/2016 at Unknown time  . albuterol (PROVENTIL) (2.5 MG/3ML) 0.083% nebulizer solution Take 2.5 mg by nebulization every 6 (six) hours as needed for wheezing or shortness of breath.   09/22/2016 at Unknown time  . ANORO ELLIPTA 62.5-25 MCG/INH AEPB INHALE ONE PUFF DAILY  6 09/20/2016 at Unknown time  . Ascorbic Acid (VITAMIN C PO) Take 1 tablet by mouth daily.   09/22/2016 at Unknown time  . diltiazem (CARDIZEM CD) 240 MG 24 hr capsule TAKE ONE CAPSULE BY  MOUTH DAILY. 30 capsule 6 09/22/2016 at Unknown time   . furosemide (LASIX) 40 MG tablet Take 1 tablet by mouth daily as needed for fluid or edema.   5 unknown  . metoprolol succinate (TOPROL-XL) 25 MG 24 hr tablet Take 12.5 mg by mouth daily.  1 09/22/2016 at [place]  . Multiple Vitamin (MULTIVITAMIN WITH MINERALS) TABS tablet Take 1 tablet by mouth every other day.   Past Week at Unknown time  . OXYGEN Inhale 2 L into the lungs daily. At night and daily as needed   09/22/2016 at Unknown time  . predniSONE (DELTASONE) 5 MG tablet Take 10 mg by mouth daily.   3 09/22/2016 at Unknown time  . PROAIR HFA 108 (90 BASE) MCG/ACT inhaler Inhale 2 puffs into the lungs every 6 (six) hours as needed for wheezing or shortness of breath.    unknown  . telmisartan-hydrochlorothiazide (MICARDIS HCT) 80-12.5 MG tablet Take 0.5 tablets by mouth daily.  1 09/22/2016 at Unknown time  . Vitamin D, Ergocalciferol, (DRISDOL) 50000 units CAPS capsule Take 50,000 Units by mouth every 30 (thirty) days.   07/2016  . XARELTO 20 MG TABS tablet TAKE ONE TABLET BY MOUTH DAILY WITH SUPPER. 30 tablet 6 09/21/2016 at 2100   Scheduled: . azithromycin  500 mg Oral q1800  . diltiazem  240 mg Oral Daily  . guaiFENesin  1,200 mg Oral BID  . hydrocortisone  25 mg Rectal BID  . insulin aspart  0-20 Units Subcutaneous TID WC  . insulin aspart  0-5 Units Subcutaneous QHS  . insulin aspart  20 Units Subcutaneous Once  . insulin aspart  5 Units Subcutaneous TID WC  . insulin glargine  13 Units Subcutaneous Daily  . ipratropium  0.5 mg Nebulization Q6H  . levalbuterol  0.63 mg Nebulization Q6H  . methylPREDNISolone (SOLU-MEDROL) injection  60 mg Intravenous Q12H  . metoprolol tartrate  50 mg Oral BID  . mometasone-formoterol  2 puff Inhalation BID  . pantoprazole  40 mg Oral Q0600  . rivaroxaban  20 mg Oral Q supper  . vitamin C  1,000 mg Oral Daily  . Vitamin D (Ergocalciferol)  50,000 Units Oral Q30 days   Continuous:  TTS:[id number] **OR** acetaminophen, furosemide, polyethylene  glycol  Assesment: She has had COPD exacerbation and is very slow to clear. She has acute on chronic hypoxic respiratory failure and is still dropping her oxygen saturation with minimal exertion or cough. Active Problems:   COPD exacerbation ([place])   Obesity   Essential hypertension   Anticoagulation management encounter   Acute on chronic respiratory failure (HCC)   Atrial flutter (HCC)   Atrial fibrillation with RVR ([place])    Plan: No change in treatment. Plan to increase oxygen flow rate if needed. Continue IV steroids.    LOS: 5 days   Henery Betzold L 09/28/2016, 8:46 AM

## 2016-09-28 NOTE — Progress Notes (Signed)
Inpatient Diabetes Program Recommendations  AACE/ADA: New Consensus Statement on Inpatient Glycemic Control (2015)  Target Ranges:  Prepandial:   less than 140 mg/dL      Peak postprandial:   less than 180 mg/dL (1-2 hours)      Critically ill patients:  140 - 180 mg/dL   Results for Fundora, [name] (MRN [id number]) as of 09/28/2016 07:58  Ref. Range 09/27/2016 07:40 09/27/2016 11:10 09/27/2016 16:30 09/27/2016 21:09 09/28/2016 07:50  Glucose-Capillary Latest Ref Range: 65 - 99 mg/dL 215 (H) 257 (H) 174 (H) 180 (H) 182 (H)  Results for Mcdonald, [name] (MRN [id number]) as of 09/28/2016 07:58  Ref. Range 09/25/2016 05:53  Hemoglobin A1C Latest Ref Range: 4.8 - 5.6 % 7.2 (H)   Review of Glycemic Control  Current orders for Inpatient glycemic control: Lantus 13 units daily, Novolog 0-20 units TID with meals, Novolog 0-5 units QHS, Novolog 5 units TID with meals  Inpatient Diabetes Program Recommendations: HgbA1C: A1C 7.2% on 09/25/16. Per ADA criteria, if A1C 6.5% or greater criteria for DM dx is met. Noted patient  takes Prednisone 10 mg daily as an outpatient which is contributing to hyperglycemia. MD, please make reference in progress note if patient will be newly dx with DM. If so, please inform patient and nursing staff so patient can be educated on DM.  Thanks, Barnie Alderman, RN, MSN, CDE Diabetes Coordinator Inpatient Diabetes Program [phone] (Team Pager from 8am to 5pm)

## 2016-09-29 LAB — HEMOGLOBIN A1C
Hgb A1c MFr Bld: 7.3 % — ABNORMAL HIGH (ref 4.8–5.6)
Mean Plasma Glucose: 163 mg/dL

## 2016-09-29 LAB — GLUCOSE, CAPILLARY
GLUCOSE-CAPILLARY: 189 mg/dL — AB (ref 65–99)
GLUCOSE-CAPILLARY: 284 mg/dL — AB (ref 65–99)

## 2016-09-29 MED ORDER — PREDNISONE 20 MG PO TABS
40.0000 mg | ORAL_TABLET | Freq: Every day | ORAL | Status: DC
  Administered 2016-09-29: 40 mg via ORAL
  Filled 2016-09-29: qty 2

## 2016-09-29 MED ORDER — METOPROLOL TARTRATE 50 MG PO TABS
50.0000 mg | ORAL_TABLET | Freq: Two times a day (BID) | ORAL | 0 refills | Status: DC
Start: 2016-09-29 — End: 2018-01-23

## 2016-09-29 MED ORDER — BLOOD GLUCOSE MONITOR KIT: PACK | 0 refills | Status: DC

## 2016-09-29 MED ORDER — PREDNISONE 10 MG PO TABS: 10.0000 mg | ORAL_TABLET | Freq: Every day | ORAL | 0 refills | Status: DC

## 2016-09-29 MED ORDER — IPRATROPIUM-ALBUTEROL 0.5-2.5 (3) MG/3ML IN SOLN: 3.0000 mL | Freq: Four times a day (QID) | RESPIRATORY_TRACT | Status: DC | PRN

## 2016-09-29 MED ORDER — PREDNISONE 10 MG PO TABS: ORAL_TABLET | ORAL | 0 refills | Status: DC

## 2016-09-29 MED ORDER — PREDNISONE 5 MG PO TABS: 10.0000 mg | ORAL_TABLET | Freq: Every day | ORAL | 0 refills | Status: DC

## 2016-09-29 MED ORDER — IPRATROPIUM-ALBUTEROL 0.5-2.5 (3) MG/3ML IN SOLN: 3.0000 mL | Freq: Four times a day (QID) | RESPIRATORY_TRACT | 0 refills | Status: DC | PRN

## 2016-09-29 MED ORDER — FLUTICASONE-SALMETEROL 250-50 MCG/DOSE IN AEPB: 1.0000 | INHALATION_SPRAY | Freq: Two times a day (BID) | RESPIRATORY_TRACT | 0 refills | Status: DC

## 2016-09-29 MED ORDER — METFORMIN HCL 500 MG PO TABS: 500.0000 mg | ORAL_TABLET | Freq: Every day | ORAL | 0 refills | Status: DC

## 2016-09-29 MED ORDER — LIVING WELL WITH DIABETES BOOK
Freq: Once | Status: AC
  Administered 2016-09-29: 09:00:00
  Filled 2016-09-29: qty 1

## 2016-09-29 NOTE — Progress Notes (Signed)
Inpatient Diabetes Program Recommendations  AACE/ADA: New Consensus Statement on Inpatient Glycemic Control (2015)  Target Ranges:  Prepandial:   less than 140 mg/dL      Peak postprandial:   less than 180 mg/dL (1-2 hours)      Critically ill patients:  140 - 180 mg/dL  Results for [place]don, [name] (MRN [id number]) as of 09/29/2016 13:00  Ref. Range 09/28/2016 07:50 09/28/2016 11:20 09/28/2016 18:09 09/28/2016 21:30 09/29/2016 07:42 09/29/2016 12:10  Glucose-Capillary Latest Ref Range: 65 - 99 mg/dL 182 (H) 292 (H) 246 (H) 235 (H) 189 (H) 284 (H)   Results for Garriga, [name] (MRN [id number]) as of 09/29/2016 13:00  Ref. Range 09/28/2016 05:03  Hemoglobin A1C Latest Ref Range: 4.8 - 5.6 % 7.3 (H)   Review of Glycemic Control  Diabetes history: No Outpatient Diabetes medications: NA Current orders for Inpatient glycemic control: Lantus 13 units daily, Novolog 5 units TID with meals, Novolog 0-20 units TID with meals, Novolog 0-5 units QHS  Inpatient Diabetes Program Recommendations:  HgbA1C: A1C 7.3% on 09/28/16 indicating an average glucose of 163 mg/dl over the past 2-3 months. Patient is being newly dx with DM.  NOTE: Spoke with patient over phone (Diabetes Coordinator not on [facility] today) about new diabetes diagnosis.  Patient states that she doesn't think she has DM but her glucose is elevated because of steroids. Explained steroid induced diabetes and explained that since she requires Prednisone chronically for her breathing we will need to monitor and treat DM. Discussed A1C results (7.3% on 09/28/16) and explained what an A1C is and informed patient that her current A1C indicates an average glucose of 163 mg/dl over the past 2-3 months. Discussed basic pathophysiology of DM Type 2, basic home care, importance of checking CBGs and maintaining good CBG control to prevent long-term and short-term complications. Reviewed glucose and A1C goals and explained that she will need to monitor glucose  as directed by the doctor. Discussed impact of nutrition, exercise, stress, sickness, and medications on diabetes control. Discussed Metformin and how it works for DM control. Explained that as IV steroids wear off and steroid are decreased her glucose may improve some. Asked patient to make follow up appointment with PCP and to be sure to take glucose log with her to her follow up appointment in case DM medication needs to be adjusted. Patient verbalized understanding of information discussed and she states that she has no further questions at this time related to diabetes. However, she does report that she is having bleeding from hemorrhoids after bowel movements and wanted to be sure the doctor was aware; informed patient I would let her nurse know after our conversation. RNs to provide ongoing basic DM education at bedside with this patient and engage patient to actively check blood glucose. Noted [facility] will contact patient outpatient regarding glycemic control. Patient will need Rx for glucometer and testing supplies. Talked with Jacqlyn Larsen, RN regarding conversation with patient and let her know about patient's concern over rectal bleeding from hemorrhoids after having bowel movements.  Thanks, Barnie Alderman, RN, MSN, CDE Diabetes Coordinator Inpatient Diabetes Program [phone] (Team Pager from 8am to 5pm)

## 2016-09-29 NOTE — Evaluation (Signed)
Clinical/Bedside Swallow Evaluation Patient Details  Name: Eileen [name] MRN: [id number] Date of Birth: 07-May-1945  Today's Date: 09/29/2016 Time: SLP Start Time (ACUTE ONLY): 1311 SLP Stop Time (ACUTE ONLY): 1332 SLP Time Calculation (min) (ACUTE ONLY): 21 min  Past Medical History:  Past Medical History:  Diagnosis Date  . Asthma   . COPD (chronic obstructive pulmonary disease) ([place])   . History of breast cancer    right breast  . Hypertension    Past Surgical History:  Past Surgical History:  Procedure Laterality Date  . CESAREAN SECTION    . HERNIA REPAIR     [facility]  . MASTECTOMY  2011   right breast   HPI:  Eileen A Tuckeris an 71 y.o.femalewith hx of afib on Xarelto, COPD on home oxygen at 2 L [place], Obesity, HTN, breast CA, saw ER MD about 2-3 weeks ago, given increased dose of Prednisone and Levoquin, gotten better, but started having more wheezing when she came off them, presented to the ER for evaluation. She has a negative CXR, and chronic leukocytosis on chronic prednisone, normal Hb and Cr. She was given nebs, and IV Solumedrol. When she was attempting to ambulate, she desat to 70 percent and became SOB. She has no orthopnea, CP, PND or peripheral edema. Of note, she has been on Xarelto, Prednisone, and NSAIDS with no PPI prophylaxis. Hospitalist was asked to admit her for hypoxic respiratory failure (COPD exacerbation).    Assessment / Plan / Recommendation Clinical Impression  Pt was provided a clinical swallowing evaluation at bedside with pt seated upright on the edge of bed. Pt consumed regular textures, puree textures and thin liquids via straw with no overt s/sx of aspiration noted with any consistencies/textures presented. Pt reports consistent tickle/"dust" in her throat causing a dry cough and once it starts she feels she must continue clearing her throat and coughing for a period of time; she reports this happens occasionally while eating but also happens  without PO intake. Question if coughing is related to GERD. Consider GI consult or possibly adjusting GERD medication. SLP reviewed universal aspiration precautions including always sitting at 90 degrees for all PO intake, consuming small bites/sips and avoiding foods that cause increased difficulty. Patient educated and results reviewed with RN. There are no further ST needs noted at this time, ST to sign off.  SLP Visit Diagnosis: Dysphagia, oropharyngeal phase (R13.12)    Aspiration Risk  Mild aspiration risk    Diet Recommendation Regular;Thin liquid   Liquid Administration via: Cup;Straw Medication Administration: Whole meds with liquid Supervision: Patient able to self feed Compensations: Minimize environmental distractions;Slow rate;Small sips/bites;Follow solids with liquid Postural Changes: Seated upright at 90 degrees;Remain upright for at least 30 minutes after po intake    Other  Recommendations Recommended Consults: Consider GI evaluation Oral Care Recommendations: Oral care BID   Follow up Recommendations None        Swallow Study   General Date of Onset: 09/22/16 HPI: Eileen A Tuckeris an 71 y.o.femalewith hx of afib on Xarelto, COPD on home oxygen at 2 L [place], Obesity, HTN, breast CA, saw ER MD about 2-3 weeks ago, given increased dose of Prednisone and Levoquin, gotten better, but started having more wheezing when she came off them, presented to the ER for evaluation. She has a negative CXR, and chronic leukocytosis on chronic prednisone, normal Hb and Cr. She was given nebs, and IV Solumedrol. When she was attempting to ambulate, she desat to 70 percent and became  SOB. She has no orthopnea, CP, PND or peripheral edema. Of note, she has been on Xarelto, Prednisone, and NSAIDS with no PPI prophylaxis. Hospitalist was asked to admit her for hypoxic respiratory failure (COPD exacerbation).  Type of Study: Bedside Swallow Evaluation Previous Swallow Assessment:  none Diet Prior to this Study: Regular;Thin liquids Temperature Spikes Noted: No Respiratory Status: Nasal cannula History of Recent Intubation: No Behavior/Cognition: Alert;Cooperative;Pleasant mood Oral Cavity Assessment: Within Functional Limits Oral Care Completed by SLP: Recent completion by staff Oral Cavity - Dentition: Dentures, top;Dentures, bottom Vision: Functional for self-feeding Self-Feeding Abilities: Able to feed self Patient Positioning: Upright in bed Baseline Vocal Quality: Normal Volitional Cough: Strong Volitional Swallow: Able to elicit    Oral/Motor/Sensory Function Overall Oral Motor/Sensory Function: Within functional limits   Ice Chips Ice chips: Within functional limits   Thin Liquid Thin Liquid: Within functional limits    Nectar Thick Nectar Thick Liquid: Not tested   Honey Thick Honey Thick Liquid: Not tested   Puree Puree: Within functional limits   Solid   Hayven Croy H. Roddie Mc, CCC-SLP Speech Language Pathologist    Solid: Within functional limits        Wende Bushy 09/29/2016,1:51 PM

## 2016-09-29 NOTE — Consult Note (Signed)
   [facility] CM Inpatient Consult   09/29/2016  [name] [place] 05/08/1945 [id number]   Referral received by diabetic coordinator for [place] Management services and post hospital discharge follow up related to a diagnosis of COPD and hypergylcemia. Patient was evaluated for telephonic based chronic disease management services with [facility] care Management Program as a benefit of patient's [employer]. Called into patient's room  to explain [facility] Care Management services. Patient states she does not feel she has diabetes but has increased sugar related to the prednisone she has been on for her breathing difficulties. Patient stated she would appreciate any help in reducing her sugars and would like for a nurse to call her once home. Verbal consent obtained. Patient gave [phone] as the best number to reach her. Patient will receive post hospital discharge calls and be evaluated for further availible [facility] services needed. [facility] Care Management does not interfere with or replace any services arranged by the inpatient care management team.  Made inpatient RNCM aware that [facility] will be following for care management. For additional questions please contact:   Anaisabel Pederson RN, [facility] Liaison  [phone]) Business Mobile [phone]) Toll free office

## 2016-09-29 NOTE — Plan of Care (Signed)
Problem: Food- and Nutrition-Related Knowledge Deficit (NB-1.1) Goal: Nutrition education Formal process to instruct or train a patient/client in a skill or to impart knowledge to help patients/clients voluntarily manage or modify food choices and eating behavior to maintain or improve health. Outcome: Adequate for Discharge  RD consulted for nutrition education regarding diabetes.   Lab Results  Component Value Date   HGBA1C 7.3 (H) 09/28/2016    RD provided "Carbohydrate Counting for People with Diabetes" handout from the Academy of Nutrition and Dietetics. Discussed different food groups and their effects on blood sugar, emphasizing carbohydrate-containing foods. Provided list of carbohydrates and recommended serving sizes of common foods and her carbohydrate meal intake goal.  Discussed importance of controlled and consistent carbohydrate intake throughout the day.  Expect fair compliance. Patient says, "I'm not claiming no diabetes". The fact that she is in denial about needing to manage her blood glucose is a major barrier for her. She allowed me to leave her education materials as noted but was not actively participating in education process.  Body mass index is 33.16 kg/m. Pt meets criteria for obese class I based on current BMI.  Current diet order is Low Sodium. Her diet was Heart Healthy CHO Modifed but was changed - unclear as to why? Patient is consuming approximately 75-100% of all meals. Labs and medications reviewed. No further nutrition interventions warranted at this time.   Colman Cater MS,RD,CSG,LDN Office: [phone] Pager: [phone]

## 2016-09-29 NOTE — Care Management (Signed)
Patient discharging home today. Juliann Pulse of [facility] notified and will obtain orders for [facility] RN from chart.

## 2016-09-29 NOTE — Progress Notes (Signed)
Pt IV and telemetry removed per NT, tolerated well.  Reviewed discharge instructions with pt and answered questions at this time.

## 2016-09-29 NOTE — Progress Notes (Signed)
Subjective: She says she feels a little better today. She says that when she gets her IV steroids it tends to cause her to be sleepy. She has no other new complaints. She's not coughing as much. She is set for speech evaluation today.  Objective: Vital signs in last 24 hours: Temp:  [97.8 F (36.6 C)-98.1 F (36.7 C)] 97.9 F (36.6 C) (07/20 0856) Pulse Rate:  [81-88] 88 (07/20 0856) Resp:  [18-20] 18 (07/20 0856) BP: (102-133)/(51-62) 133/59 (07/20 0856) SpO2:  [91 %-97 %] 95 % (07/20 0856) Weight change:  Last BM Date: 09/27/16  Intake/Output from previous day: No intake/output data recorded.  PHYSICAL EXAM General appearance: alert, cooperative and no distress Resp: clear to auscultation bilaterally Cardio: irregularly irregular rhythm GI: soft, non-tender; bowel sounds normal; no masses,  no organomegaly Extremities: extremities normal, atraumatic, no cyanosis or edema Skin warm and dry  Lab Results:  Results for orders placed or performed during the hospital encounter of 09/22/16 (from the past 48 hour(s))  Glucose, capillary     Status: Abnormal   Collection Time: 09/27/16 11:10 AM  Result Value Ref Range   Glucose-Capillary 257 (H) 65 - 99 mg/dL   Comment 1 Notify RN    Comment 2 Document in Chart   Glucose, capillary     Status: Abnormal   Collection Time: 09/27/16  4:30 PM  Result Value Ref Range   Glucose-Capillary 174 (H) 65 - 99 mg/dL   Comment 1 Notify RN    Comment 2 Document in Chart   Glucose, capillary     Status: Abnormal   Collection Time: 09/27/16  9:09 PM  Result Value Ref Range   Glucose-Capillary 180 (H) 65 - 99 mg/dL   Comment 1 Notify RN    Comment 2 Document in Chart   Hemoglobin A1c     Status: Abnormal   Collection Time: 09/28/16  5:03 AM  Result Value Ref Range   Hgb A1c MFr Bld 7.3 (H) 4.8 - 5.6 %    Comment: (NOTE)         Pre-diabetes: 5.7 - 6.4         Diabetes: >6.4         Glycemic control for adults with diabetes: <7.0     Mean Plasma Glucose 163 mg/dL    Comment: (NOTE) Performed At: [facility] [place], [place] [id number] Lindon Romp MD HB:[id number]   Basic metabolic panel     Status: Abnormal   Collection Time: 09/28/16  5:03 AM  Result Value Ref Range   Sodium 140 135 - 145 mmol/L   Potassium 4.8 3.5 - 5.1 mmol/L   Chloride 90 (L) 101 - 111 mmol/L   CO2 41 (H) 22 - 32 mmol/L   Glucose, Bld 193 (H) 65 - 99 mg/dL   BUN 23 (H) 6 - 20 mg/dL   Creatinine, Ser 0.72 0.44 - 1.00 mg/dL   Calcium 9.5 8.9 - 10.3 mg/dL   GFR calc non Af Amer >60 >60 mL/min   GFR calc Af Amer >60 >60 mL/min    Comment: (NOTE) The eGFR has been calculated using the CKD EPI equation. This calculation has not been validated in all clinical situations. eGFR's persistently <60 mL/min signify possible Chronic Kidney Disease.    Anion gap 9 5 - 15  Glucose, capillary     Status: Abnormal   Collection Time: 09/28/16  7:50 AM  Result Value Ref Range   Glucose-Capillary 182 (H) 65 -  99 mg/dL   Comment 1 Notify RN    Comment 2 Document in Chart   Glucose, capillary     Status: Abnormal   Collection Time: 09/28/16 11:20 AM  Result Value Ref Range   Glucose-Capillary 292 (H) 65 - 99 mg/dL   Comment 1 Notify RN    Comment 2 Document in Chart   Glucose, capillary     Status: Abnormal   Collection Time: 09/28/16  6:09 PM  Result Value Ref Range   Glucose-Capillary 246 (H) 65 - 99 mg/dL   Comment 1 Notify RN    Comment 2 Document in Chart   Glucose, capillary     Status: Abnormal   Collection Time: 09/28/16  9:30 PM  Result Value Ref Range   Glucose-Capillary 235 (H) 65 - 99 mg/dL   Comment 1 Notify RN    Comment 2 Document in Chart   Glucose, capillary     Status: Abnormal   Collection Time: 09/29/16  7:42 AM  Result Value Ref Range   Glucose-Capillary 189 (H) 65 - 99 mg/dL   Comment 1 Notify RN    Comment 2 Document in Chart     ABGS No results for input(s): PHART, PO2ART, TCO2, HCO3 in  the last 72 hours.  Invalid input(s): PCO2 CULTURES No results found for this or any previous visit (from the past 240 hour(s)). Studies/Results: No results found.  Medications:  Prior to Admission:  Prescriptions Prior to Admission  Medication Sig Dispense Refill Last Dose  . acetaminophen (TYLENOL) 500 MG tablet Take 500 mg by mouth every other day.   09/22/2016 at Unknown time  . albuterol (PROVENTIL) (2.5 MG/3ML) 0.083% nebulizer solution Take 2.5 mg by nebulization every 6 (six) hours as needed for wheezing or shortness of breath.   09/22/2016 at Unknown time  . ANORO ELLIPTA 62.5-25 MCG/INH AEPB INHALE ONE PUFF DAILY  6 09/20/2016 at Unknown time  . Ascorbic Acid (VITAMIN C PO) Take 1 tablet by mouth daily.   09/22/2016 at Unknown time  . diltiazem (CARDIZEM CD) 240 MG 24 hr capsule TAKE ONE CAPSULE BY MOUTH DAILY. 30 capsule 6 09/22/2016 at Unknown time  . furosemide (LASIX) 40 MG tablet Take 1 tablet by mouth daily as needed for fluid or edema.   5 unknown  . metoprolol succinate (TOPROL-XL) 25 MG 24 hr tablet Take 12.5 mg by mouth daily.  1 09/22/2016 at [place]  . Multiple Vitamin (MULTIVITAMIN WITH MINERALS) TABS tablet Take 1 tablet by mouth every other day.   Past Week at Unknown time  . OXYGEN Inhale 2 L into the lungs daily. At night and daily as needed   09/22/2016 at Unknown time  . predniSONE (DELTASONE) 5 MG tablet Take 10 mg by mouth daily.   3 09/22/2016 at Unknown time  . PROAIR HFA 108 (90 BASE) MCG/ACT inhaler Inhale 2 puffs into the lungs every 6 (six) hours as needed for wheezing or shortness of breath.    unknown  . telmisartan-hydrochlorothiazide (MICARDIS HCT) 80-12.5 MG tablet Take 0.5 tablets by mouth daily.  1 09/22/2016 at Unknown time  . Vitamin D, Ergocalciferol, (DRISDOL) 50000 units CAPS capsule Take 50,000 Units by mouth every 30 (thirty) days.   07/2016  . XARELTO 20 MG TABS tablet TAKE ONE TABLET BY MOUTH DAILY WITH SUPPER. 30 tablet 6 09/21/2016 at 2100    Scheduled: . azithromycin  500 mg Oral q1800  . diltiazem  240 mg Oral Daily  . guaiFENesin  1,200 mg  Oral BID  . hydrocortisone  25 mg Rectal BID  . insulin aspart  0-20 Units Subcutaneous TID WC  . insulin aspart  0-5 Units Subcutaneous QHS  . insulin aspart  20 Units Subcutaneous Once  . insulin aspart  5 Units Subcutaneous TID WC  . insulin glargine  13 Units Subcutaneous Daily  . ipratropium  0.5 mg Nebulization Q6H  . levalbuterol  0.63 mg Nebulization Q6H  . living well with diabetes book   Does not apply Once  . metoprolol tartrate  50 mg Oral BID  . mometasone-formoterol  2 puff Inhalation BID  . pantoprazole  40 mg Oral Q0600  . predniSONE  40 mg Oral Q breakfast  . rivaroxaban  20 mg Oral Q supper  . vitamin C  1,000 mg Oral Daily  . Vitamin D (Ergocalciferol)  50,000 Units Oral Q30 days   Continuous:  URK:[id number] **OR** acetaminophen, furosemide, polyethylene glycol  Assesment: She was admitted with COPD exacerbation. She's been having trouble maintaining her oxygenation. There is some question as to whether she is aspirating and speech evaluation is underway. She says she's having trouble tolerating Solu-Medrol and she is much better today so I think it's okay to switch her to prednisone. Her oxygenation is better but not back to baseline Active Problems:   COPD exacerbation (HCC)   Obesity   Essential hypertension   Anticoagulation management encounter   Acute on chronic respiratory failure (HCC)   Atrial flutter (HCC)   Atrial fibrillation with RVR (HCC)    Plan: Discontinue Solu-Medrol. Continue oxygen. Start prednisone. Speech evaluation today.    LOS: 6 days   Mykal Batiz L 09/29/2016, 9:06 AM

## 2016-09-29 NOTE — Discharge Summary (Signed)
Physician Discharge Summary  [name] Vanderstelt OEU:[id number] DOB: [birth year] DOA: 09/22/2016  PCP: Lavella Lemons, PA  Admit date: 09/22/2016 Discharge date: 09/29/2016  Admitted From: Home  Disposition:  Home   Recommendations for Outpatient Follow-up:  1. Follow up with PCP in 1- week. 2. Patient placed on duoneb 3. Reported tremors and nervousness with ANORO 4. Will prescribed [place]: No  Equipment/Devices: Home 02/ nebulizer machine   Discharge Condition: Home  CODE STATUS: Full  Diet recommendation: Heart Healthy / Carb Modified   Brief/Interim Summary: 71 year old female who presented with dyspnea. Patient is known to have atrial fibrillation, COPD on home oxygen, hypertension, history of breast cancer. Worsening symptoms for last 2-3 weeks prior to hospitalization, dyspnea associated with wheezing. She was noted to have desaturation down to 70% on ambulation. On his initial physical examination, heart rate 92, respiratory rate 18, oxygen saturation 95%. Moist mucous membranes, lungs with no significant wheezing, rales or rhonchi, heart S1-S2 present rhythmic, abdomen soft nontender, no lower extremity edema. Sodium 138, potassium 4.6, chloride 95, bicarbonate 34, glucose 194, BUN 17, creatinine 0.7, white count 13.3, hemoglobin 14.7, hematocrit 36.4, platelets 246. Chest x-ray was clear for infiltrates, positive hyperinflation. EKG with atrial fibrillation rhythm.  She was admitted to the hospital working diagnosis of acute on chronic hypoxic respiratory failure due to COPD exacerbation.  1. COPD exacerbation with acute on chronic hypoxic respiratory failure. Patient was admitted to the medical floor, on remote telemetry monitor. She was placed on systemic steroids, aggressive bronchodilator therapy. She had a slow response to therapy, she was deemed to be stable to discharge on July 20. She reported side effects to [facility], will switch to Advair, will add short-acting  anticholinergic with DuoNeb to use as needed, continue taper prednisone down to 10 mg daily. Follow-up as an outpatient in the pulmonary clinic. Continue supplemental oxygen per nasal cannula at home. She will get a swallow evaluation before discharge.  2. Type 2 diabetes mellitus, likely steroid-induced. Patient was placed on insulin sliding scale for glucose coverage and monitoring, plus basal insulin with coverage with 13 units of insulin glargine. Hemoglobin A1c was noted to be elevated to 7.3, she will be discharged on metformin 500 g twice daily. Diabetic teaching and a home glucometer. Capillary glucose 292, 246, 235, 189.   3. Atrial fibrillation, chronic. Rate remained well-controlled, patient was continued on diltiazem and metoprolol. Anticoagulation with rivaroxaban. Dose of metoprolol was increased to 50 mg twice daily, metoprolol tartrate.   4. Hypertension. Blood pressure remained well-controlled during her hospitalization, she will resume telmisartan-hydrochlorothiazide combination, by the time of discharge. Continuously furosemide.   Discharge Diagnoses:  Active Problems:   COPD exacerbation ([place])   Obesity   Essential hypertension   Anticoagulation management encounter   Acute on chronic respiratory failure (HCC)   Atrial flutter (HCC)   Atrial fibrillation with RVR ([place])    Discharge Instructions   Allergies as of 09/29/2016      Reactions   Codeine Other (See Comments)   "jittery"      Medication List    STOP taking these medications   ANORO ELLIPTA 62.5-25 MCG/INH Aepb Generic drug:  umeclidinium-vilanterol   metoprolol succinate 25 MG 24 hr tablet Commonly known as:  TOPROL-XL     TAKE these medications   acetaminophen 500 MG tablet Commonly known as:  TYLENOL Take 500 mg by mouth every other day.   diltiazem 240 MG 24 hr capsule Commonly known as:  CARDIZEM CD  TAKE ONE CAPSULE BY MOUTH DAILY.   Fluticasone-Salmeterol 250-50 MCG/DOSE Aepb Commonly  known as:  ADVAIR DISKUS Inhale 1 puff into the lungs 2 (two) times daily.   furosemide 40 MG tablet Commonly known as:  LASIX Take 1 tablet by mouth daily as needed for fluid or edema.   ipratropium-albuterol 0.5-2.5 (3) MG/3ML Soln Commonly known as:  DUONEB Take 3 mLs by nebulization every 6 (six) hours as needed (as needed for shortness of breath.).   metFORMIN 500 MG tablet Commonly known as:  GLUCOPHAGE Take 1 tablet (500 mg total) by mouth daily with breakfast.   metoprolol tartrate 50 MG tablet Commonly known as:  LOPRESSOR Take 1 tablet (50 mg total) by mouth 2 (two) times daily. What changed:  medication strength  how much to take   multivitamin with minerals Tabs tablet Take 1 tablet by mouth every other day.   OXYGEN Inhale 2 L into the lungs daily. At night and daily as needed   predniSONE 5 MG tablet Commonly known as:  DELTASONE Take 2 tablets (10 mg total) by mouth daily. What changed:  Another medication with the same name was added. Make sure you understand how and when to take each.   predniSONE 10 MG tablet Commonly known as:  DELTASONE Take 4 tablets daily for 2 days, then take 2 tablets daily for 2 days, then resume 5 mg tablets, 2 per day. What changed:  You were already taking a medication with the same name, and this prescription was added. Make sure you understand how and when to take each.   PROAIR HFA 108 (90 Base) MCG/ACT inhaler Generic drug:  albuterol Inhale 2 puffs into the lungs every 6 (six) hours as needed for wheezing or shortness of breath. What changed:  Another medication with the same name was removed. Continue taking this medication, and follow the directions you see here.   telmisartan-hydrochlorothiazide 80-12.5 MG tablet Commonly known as:  MICARDIS HCT Take 0.5 tablets by mouth daily.   VITAMIN C PO Take 1 tablet by mouth daily.   Vitamin D (Ergocalciferol) 50000 units Caps capsule Commonly known as:  DRISDOL Take  50,000 Units by mouth every 30 (thirty) days.   XARELTO 20 MG Tabs tablet Generic drug:  rivaroxaban TAKE ONE TABLET BY MOUTH DAILY WITH SUPPER.       Allergies  Allergen Reactions  . Codeine Other (See Comments)    "jittery"     Consultations:  Pulmonary    Procedures/Studies: Dg Chest Portable 1 View  Result Date: 09/22/2016 CLINICAL DATA:  Shortness of breath. EXAM: PORTABLE CHEST 1 VIEW COMPARISON:  CT of the chest 09/06/2016. FINDINGS: Heart size is normal. Pulmonary arterial enlargement is again noted. Mild chronic interstitial coarsening is present. There is no edema or effusion. No focal airspace disease is present. Chronic blunting of the left CP angle is related to a diaphragmatic hernia. IMPRESSION: 1. No acute cardiopulmonary disease or significant interval change. 2. Pulmonary arterial hypertension. Electronically Signed   By: San Morelle M.D.   On: 09/22/2016 19:00      Subjective: Patient is feeling better, dyspnea is close to baseline, no chest pain or abdominal pain. No fever or chills.   Discharge Exam: Vitals:   09/29/16 0436 09/29/16 0856  BP: (!) 102/51 (!) 133/59  Pulse: 81 88  Resp: 18 18  Temp: 98.1 F (36.7 C) 97.9 F (36.6 C)   Vitals:   09/28/16 1940 09/29/16 0157 09/29/16 0436 09/29/16 0856  BP:   Marland Kitchen)  102/51 (!) 133/59  Pulse: 82  81 88  Resp: 18  18 18   Temp:   98.1 F (36.7 C) 97.9 F (36.6 C)  TempSrc:   Oral Oral  SpO2: 93% 95% 97% 95%  Weight:      Height:        General: Pt is alert, awake, not in acute distress E ENT: mild pallor, no icterus.  Cardiovascular: RRR, S1/S2 +, no rubs, no gallops Respiratory: decreased breath sounds bilaterally with no wheezing, no rhonchi, scattered rales.  Abdominal: Soft, NT, ND, bowel sounds + Extremities: no edema, no cyanosis    The results of significant diagnostics from this hospitalization (including imaging, microbiology, ancillary and laboratory) are listed below for  reference.     Microbiology: No results found for this or any previous visit (from the past 240 hour(s)).   Labs: BNP (last 3 results)  Recent Labs  09/22/16 1825  BNP 60.1   Basic Metabolic Panel:  Recent Labs Lab 09/22/16 1824 09/23/16 0541 09/25/16 0553 09/27/16 0459 09/28/16 0503  NA 138 140 136 139 140  K 4.6 4.7 4.8 4.8 4.8  CL 95* 97* 93* 91* 90*  CO2 34* 35* 36* 41* 41*  GLUCOSE 194* 173* 230* 195* 193*  BUN 17 14 21* 20 23*  CREATININE 0.87 0.66 0.67 0.72 0.72  CALCIUM 10.1 9.6 9.0 9.3 9.5   Liver Function Tests:  Recent Labs Lab 09/22/16 1824 09/23/16 0541  AST 22 18  ALT 21 18  ALKPHOS 69 53  BILITOT 0.7 0.7  PROT 7.5 7.0  ALBUMIN 4.5 4.1   No results for input(s): LIPASE, AMYLASE in the last 168 hours. No results for input(s): AMMONIA in the last 168 hours. CBC:  Recent Labs Lab 09/22/16 1824 09/23/16 0541 09/25/16 0553 09/27/16 0459  WBC 13.3* 14.4* 19.5* 14.2*  NEUTROABS 11.4*  --   --   --   HGB 14.7 13.5 12.8 13.0  HCT 46.4* 42.7 40.9 42.2  MCV 96.3 94.7 96.7 97.0  PLT 249 240 248 236   Cardiac Enzymes:  Recent Labs Lab 09/22/16 1824  TROPONINI <0.03   BNP: Invalid input(s): POCBNP CBG:  Recent Labs Lab 09/28/16 0750 09/28/16 1120 09/28/16 1809 09/28/16 2130 09/29/16 0742  GLUCAP 182* 292* 246* 235* 189*   D-Dimer No results for input(s): DDIMER in the last 72 hours. Hgb A1c  Recent Labs  09/28/16 0503  HGBA1C 7.3*   Lipid Profile No results for input(s): CHOL, HDL, LDLCALC, TRIG, CHOLHDL, LDLDIRECT in the last 72 hours. Thyroid function studies No results for input(s): TSH, T4TOTAL, T3FREE, THYROIDAB in the last 72 hours.  Invalid input(s): FREET3 Anemia work up No results for input(s): VITAMINB12, FOLATE, FERRITIN, TIBC, IRON, RETICCTPCT in the last 72 hours. Urinalysis    Component Value Date/Time   COLORURINE YELLOW 09/09/2009 1010   APPEARANCEUR HAZY (A) 09/09/2009 1010   LABSPEC 1.030  09/09/2009 1010   PHURINE 5.5 09/09/2009 1010   GLUCOSEU NEGATIVE 09/09/2009 1010   HGBUR NEGATIVE 09/09/2009 1010   BILIRUBINUR NEGATIVE 09/09/2009 1010   KETONESUR NEGATIVE 09/09/2009 1010   PROTEINUR NEGATIVE 09/09/2009 1010   UROBILINOGEN 0.2 09/09/2009 1010   NITRITE NEGATIVE 09/09/2009 1010   LEUKOCYTESUR  09/09/2009 1010    NEGATIVE MICROSCOPIC NOT DONE ON URINES WITH NEGATIVE PROTEIN, BLOOD, LEUKOCYTES, NITRITE, OR GLUCOSE <1000 mg/dL.   Sepsis Labs Invalid input(s): PROCALCITONIN,  WBC,  LACTICIDVEN Microbiology No results found for this or any previous visit (from the past 240 hour(s)).  Time coordinating discharge: 45 minutes  SIGNED:   Tawni Millers, MD  [facility] 09/29/2016, 9:40 AM Pager   If 7PM-7AM, please contact night-coverage [email or web address] Password [email or web address]

## 2016-10-01 DIAGNOSIS — Z9981 Dependence on supplemental oxygen: Secondary | ICD-10-CM | POA: Diagnosis not present

## 2016-10-01 DIAGNOSIS — J441 Chronic obstructive pulmonary disease with (acute) exacerbation: Secondary | ICD-10-CM | POA: Diagnosis not present

## 2016-10-01 DIAGNOSIS — E0965 Drug or chemical induced diabetes mellitus with hyperglycemia: Secondary | ICD-10-CM | POA: Diagnosis not present

## 2016-10-01 DIAGNOSIS — I482 Chronic atrial fibrillation: Secondary | ICD-10-CM | POA: Diagnosis not present

## 2016-10-01 DIAGNOSIS — Z7984 Long term (current) use of oral hypoglycemic drugs: Secondary | ICD-10-CM | POA: Diagnosis not present

## 2016-10-01 DIAGNOSIS — Z7901 Long term (current) use of anticoagulants: Secondary | ICD-10-CM | POA: Diagnosis not present

## 2016-10-01 DIAGNOSIS — I4892 Unspecified atrial flutter: Secondary | ICD-10-CM | POA: Diagnosis not present

## 2016-10-01 DIAGNOSIS — I10 Essential (primary) hypertension: Secondary | ICD-10-CM | POA: Diagnosis not present

## 2016-10-01 DIAGNOSIS — J9621 Acute and chronic respiratory failure with hypoxia: Secondary | ICD-10-CM | POA: Diagnosis not present

## 2016-10-02 ENCOUNTER — Other Ambulatory Visit: Payer: Self-pay

## 2016-10-03 DIAGNOSIS — I10 Essential (primary) hypertension: Secondary | ICD-10-CM | POA: Diagnosis not present

## 2016-10-03 DIAGNOSIS — Z9981 Dependence on supplemental oxygen: Secondary | ICD-10-CM | POA: Diagnosis not present

## 2016-10-03 DIAGNOSIS — J9621 Acute and chronic respiratory failure with hypoxia: Secondary | ICD-10-CM | POA: Diagnosis not present

## 2016-10-03 DIAGNOSIS — E0965 Drug or chemical induced diabetes mellitus with hyperglycemia: Secondary | ICD-10-CM | POA: Diagnosis not present

## 2016-10-03 DIAGNOSIS — I4892 Unspecified atrial flutter: Secondary | ICD-10-CM | POA: Diagnosis not present

## 2016-10-03 DIAGNOSIS — Z7984 Long term (current) use of oral hypoglycemic drugs: Secondary | ICD-10-CM | POA: Diagnosis not present

## 2016-10-03 DIAGNOSIS — I482 Chronic atrial fibrillation: Secondary | ICD-10-CM | POA: Diagnosis not present

## 2016-10-03 DIAGNOSIS — J441 Chronic obstructive pulmonary disease with (acute) exacerbation: Secondary | ICD-10-CM | POA: Diagnosis not present

## 2016-10-03 DIAGNOSIS — Z7901 Long term (current) use of anticoagulants: Secondary | ICD-10-CM | POA: Diagnosis not present

## 2016-10-03 NOTE — Patient Outreach (Addendum)
[facility]) Care Management  10/03/2016   [name] Mccreadie 1946/03/07 [id number]  Subjective:  Iwas in the hospital because I was having a hard time breathing.  Objective:  Female, 71 years old with a history of COPD is on oxygen continuously at 2.5 liters per minute via nasal cannula  Current Medications:  Current Outpatient Prescriptions  Medication Sig Dispense Refill  . acetaminophen (TYLENOL) 500 MG tablet Take 500 mg by mouth every other day.    . Ascorbic Acid (VITAMIN C PO) Take 1 tablet by mouth daily.    . blood glucose meter kit and supplies KIT Dispense based on patient and insurance preference. Use up to four times daily as directed. (FOR ICD-9 250.00, 250.01).  Check sugar before meals and at night, keep a log. 1 each 0  . diltiazem (CARDIZEM CD) 240 MG 24 hr capsule TAKE ONE CAPSULE BY MOUTH DAILY. 30 capsule 6  . Fluticasone-Salmeterol (ADVAIR DISKUS) 250-50 MCG/DOSE AEPB Inhale 1 puff into the lungs 2 (two) times daily. 60 each 0  . furosemide (LASIX) 40 MG tablet Take 1 tablet by mouth daily as needed for fluid or edema.   5  . ipratropium-albuterol (DUONEB) 0.5-2.5 (3) MG/3ML SOLN Take 3 mLs by nebulization every 6 (six) hours as needed (as needed for shortness of breath.). 360 mL 0  . metFORMIN (GLUCOPHAGE) 500 MG tablet Take 1 tablet (500 mg total) by mouth daily with breakfast. 30 tablet 0  . metoprolol tartrate (LOPRESSOR) 50 MG tablet Take 1 tablet (50 mg total) by mouth 2 (two) times daily. 30 tablet 0  . Multiple Vitamin (MULTIVITAMIN WITH MINERALS) TABS tablet Take 1 tablet by mouth every other day.    . OXYGEN Inhale 2 L into the lungs daily. At night and daily as needed    . predniSONE (DELTASONE) 10 MG tablet Take 4 tablets daily for 2 days, then take 2 tablets daily for 2 days, then resume 5 mg tablets, 2 per day. 12 tablet 0  . predniSONE (DELTASONE) 5 MG tablet Take 2 tablets (10 mg total) by mouth daily. 30 tablet 0  . PROAIR HFA 108  (90 BASE) MCG/ACT inhaler Inhale 2 puffs into the lungs every 6 (six) hours as needed for wheezing or shortness of breath.     . telmisartan-hydrochlorothiazide (MICARDIS HCT) 80-12.5 MG tablet Take 0.5 tablets by mouth daily.  1  . Vitamin D, Ergocalciferol, (DRISDOL) 50000 units CAPS capsule Take 50,000 Units by mouth every 30 (thirty) days.    Alveda Reasons 20 MG TABS tablet TAKE ONE TABLET BY MOUTH DAILY WITH SUPPER. 30 tablet 6   No current facility-administered medications for this visit.     Functional Status:  In your present state of health, do you have any difficulty performing the following activities: 09/23/2016  Hearing? N  Vision? N  Difficulty concentrating or making decisions? N  Walking or climbing stairs? N  Dressing or bathing? N  Doing errands, shopping? Y  Some recent data might be hidden   [facility] CM Care Plan Problem One     Most Recent Value  Care Plan Problem One  patient had recent acute care admission for COPD Exaceration  Role Documenting the Problem One  Care Management [place] for Problem One  Active  [facility] Long Term Goal   patient will have no acute care admission in the next 31 days  [facility] Long Term Goal Start Date  10/02/16  Interventions for Problem One Long Term  Goal  7/23 transition of care call made  [facility] CM Short Term Goal #1   patient will meet with [facility] RNCM for COPD Education  [facility] CM Short Term Goal #1 Start Date  10/02/16  Interventions for Short Term Goal #1  7/23 assessment of community care coordination needs completed    [facility] CM Care Plan Problem Two     Most Recent Value  Care Plan Problem Two  patient was discharge with instructions for a low sodium diet, however, she lacks knowledge related to low sodium diet  Role Documenting the Problem Two  Care Management [place] for Problem Two  Active  Interventions for Problem Two Long Term Goal   7/23 during initial telephone contact patient advises this RNCM  she lacks  knowledge related to low sodium diet  [facility] Long Term Goal  In the next 31 days, patient will meet iwith the [facility] RNCM for low sodium diet education  [facility] Long Term Goal Start Date  10/02/16     Fall/Depression Screening: No flowsheet data found. No flowsheet data found.  Assessment:  Patient has recent hospitalization for COPD exacerbation. Patient collaborated with this CM to create her case management care plan.   Plan:  Home visit in the next 21 day for COPD Education, assess need for community referrals

## 2016-10-05 DIAGNOSIS — E0965 Drug or chemical induced diabetes mellitus with hyperglycemia: Secondary | ICD-10-CM | POA: Diagnosis not present

## 2016-10-05 DIAGNOSIS — Z7901 Long term (current) use of anticoagulants: Secondary | ICD-10-CM | POA: Diagnosis not present

## 2016-10-05 DIAGNOSIS — J441 Chronic obstructive pulmonary disease with (acute) exacerbation: Secondary | ICD-10-CM | POA: Diagnosis not present

## 2016-10-05 DIAGNOSIS — J9621 Acute and chronic respiratory failure with hypoxia: Secondary | ICD-10-CM | POA: Diagnosis not present

## 2016-10-05 DIAGNOSIS — I482 Chronic atrial fibrillation: Secondary | ICD-10-CM | POA: Diagnosis not present

## 2016-10-05 DIAGNOSIS — I4892 Unspecified atrial flutter: Secondary | ICD-10-CM | POA: Diagnosis not present

## 2016-10-05 DIAGNOSIS — I10 Essential (primary) hypertension: Secondary | ICD-10-CM | POA: Diagnosis not present

## 2016-10-05 DIAGNOSIS — Z7984 Long term (current) use of oral hypoglycemic drugs: Secondary | ICD-10-CM | POA: Diagnosis not present

## 2016-10-05 DIAGNOSIS — Z9981 Dependence on supplemental oxygen: Secondary | ICD-10-CM | POA: Diagnosis not present

## 2016-10-10 ENCOUNTER — Ambulatory Visit: Payer: Medicare Other

## 2016-10-10 DIAGNOSIS — J449 Chronic obstructive pulmonary disease, unspecified: Secondary | ICD-10-CM | POA: Diagnosis not present

## 2016-10-10 DIAGNOSIS — R0902 Hypoxemia: Secondary | ICD-10-CM | POA: Diagnosis not present

## 2016-10-11 DIAGNOSIS — I4892 Unspecified atrial flutter: Secondary | ICD-10-CM | POA: Diagnosis not present

## 2016-10-11 DIAGNOSIS — I482 Chronic atrial fibrillation: Secondary | ICD-10-CM | POA: Diagnosis not present

## 2016-10-11 DIAGNOSIS — I10 Essential (primary) hypertension: Secondary | ICD-10-CM | POA: Diagnosis not present

## 2016-10-11 DIAGNOSIS — J9621 Acute and chronic respiratory failure with hypoxia: Secondary | ICD-10-CM | POA: Diagnosis not present

## 2016-10-11 DIAGNOSIS — Z7984 Long term (current) use of oral hypoglycemic drugs: Secondary | ICD-10-CM | POA: Diagnosis not present

## 2016-10-11 DIAGNOSIS — E0965 Drug or chemical induced diabetes mellitus with hyperglycemia: Secondary | ICD-10-CM | POA: Diagnosis not present

## 2016-10-11 DIAGNOSIS — J441 Chronic obstructive pulmonary disease with (acute) exacerbation: Secondary | ICD-10-CM | POA: Diagnosis not present

## 2016-10-11 DIAGNOSIS — Z7901 Long term (current) use of anticoagulants: Secondary | ICD-10-CM | POA: Diagnosis not present

## 2016-10-11 DIAGNOSIS — Z9981 Dependence on supplemental oxygen: Secondary | ICD-10-CM | POA: Diagnosis not present

## 2016-10-12 ENCOUNTER — Other Ambulatory Visit: Payer: Self-pay

## 2016-10-12 DIAGNOSIS — I5032 Chronic diastolic (congestive) heart failure: Secondary | ICD-10-CM | POA: Diagnosis not present

## 2016-10-12 DIAGNOSIS — J449 Chronic obstructive pulmonary disease, unspecified: Secondary | ICD-10-CM | POA: Diagnosis not present

## 2016-10-12 DIAGNOSIS — J9611 Chronic respiratory failure with hypoxia: Secondary | ICD-10-CM | POA: Diagnosis not present

## 2016-10-12 DIAGNOSIS — E119 Type 2 diabetes mellitus without complications: Secondary | ICD-10-CM | POA: Diagnosis not present

## 2016-10-12 NOTE — Patient Outreach (Signed)
[facility]) Care Management   10/12/2016  Eileen [name] [birth year] [id number]  Eileen [name] is an 71 y.o. female  Subjective:  I am feeling much better now. I am coughing up very little.  Objective:   ROS Elderly lady, wearing oxygen via nasal cannula.  Physical Exam ROS  Encounter Medications:   Outpatient Encounter Prescriptions as of 10/12/2016  Medication Sig Note  . acetaminophen (TYLENOL) 500 MG tablet Take 500 mg by mouth every other day.   . Ascorbic Acid (VITAMIN C PO) Take 1 tablet by mouth daily.   . blood glucose meter kit and supplies KIT Dispense based on patient and insurance preference. Use up to four times daily as directed. (FOR ICD-9 250.00, 250.01).  Check sugar before meals and at night, keep a log.   . diltiazem (CARDIZEM CD) 240 MG 24 hr capsule TAKE ONE CAPSULE BY MOUTH DAILY.   Marland Kitchen Fluticasone-Salmeterol (ADVAIR DISKUS) 250-50 MCG/DOSE AEPB Inhale 1 puff into the lungs 2 (two) times daily.   . furosemide (LASIX) 40 MG tablet Take 1 tablet by mouth daily as needed for fluid or edema.  06/04/2015: Received from: External Pharmacy Received Sig: TAKE ONE TABLET BY MOUTH DAILY AS NEEDED.  Marland Kitchen ipratropium-albuterol (DUONEB) 0.5-2.5 (3) MG/3ML SOLN Take 3 mLs by nebulization every 6 (six) hours as needed (as needed for shortness of breath.).   Marland Kitchen metFORMIN (GLUCOPHAGE) 500 MG tablet Take 1 tablet (500 mg total) by mouth daily with breakfast.   . metoprolol tartrate (LOPRESSOR) 50 MG tablet Take 1 tablet (50 mg total) by mouth 2 (two) times daily.   . Multiple Vitamin (MULTIVITAMIN WITH MINERALS) TABS tablet Take 1 tablet by mouth every other day.   . OXYGEN Inhale 2 L into the lungs daily. At night and daily as needed   . predniSONE (DELTASONE) 10 MG tablet Take 4 tablets daily for 2 days, then take 2 tablets daily for 2 days, then resume 5 mg tablets, 2 per day.   . predniSONE (DELTASONE) 5 MG tablet Take 2 tablets (10 mg total) by mouth daily.    Marland Kitchen PROAIR HFA 108 (90 BASE) MCG/ACT inhaler Inhale 2 puffs into the lungs every 6 (six) hours as needed for wheezing or shortness of breath.    . telmisartan-hydrochlorothiazide (MICARDIS HCT) 80-12.5 MG tablet Take 0.5 tablets by mouth daily. 06/04/2015: Received from: External Pharmacy Received Sig: See admin instructions.  . Vitamin D, Ergocalciferol, (DRISDOL) 50000 units CAPS capsule Take 50,000 Units by mouth every 30 (thirty) days.   Alveda Reasons 20 MG TABS tablet TAKE ONE TABLET BY MOUTH DAILY WITH SUPPER.    No facility-administered encounter medications on file as of 10/12/2016.     Functional Status:   In your present state of health, do you have any difficulty performing the following activities: 10/03/2016 09/23/2016  Hearing? N N  Vision? Y N  Difficulty concentrating or making decisions? - N  Walking or climbing stairs? Y N  Dressing or bathing? N N  Doing errands, shopping? Tempie Donning  Preparing Food and eating ? N -  Using the Toilet? N -  In the past six months, have you accidently leaked urine? N -  Do you have problems with loss of bowel control? N -  Managing your Medications? N -  Managing your Finances? N -  Housekeeping or managing your Housekeeping? Y -  Some recent data might be hidden    Fall/Depression Screening:    Fall Risk  10/03/2016  Falls in the past year? Yes  Number falls in past yr: 1  Injury with Fall? No  Risk for fall due to : History of fall(s);Impaired mobility;Impaired vision;Medication side effect;Impaired balance/gait  Follow up Follow up appointment   PHQ 2/9 Scores 10/03/2016  PHQ - 2 Score 0   THN CM Care Plan Problem One     Most Recent Value  Care Plan Problem One  (P) patient had recent acute care admission for COPD Exaceration  Role Documenting the Problem One  (P) Care Management [place] for Problem One  (P) Active  THN Long Term Goal   (P) patient will have no acute care admission in the next 31 days  THN Long Term Goal  Start Date  (P) 10/02/16  Interventions for Problem One Long Term Goal  (P) 7/23 transition of care call made  [facility] CM Short Term Goal #1   (P) patient will meet with [facility] RNCM for COPD Education  [facility] CM Short Term Goal #1 Start Date  (P) 10/02/16  Interventions for Short Term Goal #1  (P) 7/23 assessment of community care coordination needs completed    [facility] CM Care Plan Problem Two     Most Recent Value  Care Plan Problem Two  (P) patient was discharge with instructions for a low sodium diet, however, she lacks knowledge related to low sodium diet  Role Documenting the Problem Two  (P) Care Management [place] for Problem Two  (P) Active  Interventions for Problem Two Long Term Goal   (P) 7/23 during initial telephone contact patient advises this RNCM  she lacks knowledge related to low sodium diet  THN Long Term Goal  (P) In the next 31 days, patient will meet iwith the [facility] RNCM for low sodium diet education  THN Long Term Goal Start Date  (P) 10/02/16     Assessment:   Patient met with this RNCM for COPD and low sodium education. Patient viewed EMMI Videos for COPD and Sodium Education.  Patient states she feels a whole lot better. Patient endorses daily weights and monitors her blood pressure levels at home. Patient states she has been in attendance to all her medical appointments. Patient states she has is able to afford to afford the cpayments for her medications and medical appointments.   Plan:  Telephone call in the next 21 days to assess for community care coordination needs.

## 2016-10-16 ENCOUNTER — Other Ambulatory Visit: Payer: Self-pay

## 2016-10-16 NOTE — Patient Outreach (Signed)
    Telephone contact made to update quality measures. Patient denies signs and symptoms of COPD exacerbation.  Plan: Telephone contact in the next 21 days.

## 2016-10-17 DIAGNOSIS — I4892 Unspecified atrial flutter: Secondary | ICD-10-CM | POA: Diagnosis not present

## 2016-10-17 DIAGNOSIS — Z7984 Long term (current) use of oral hypoglycemic drugs: Secondary | ICD-10-CM | POA: Diagnosis not present

## 2016-10-17 DIAGNOSIS — I10 Essential (primary) hypertension: Secondary | ICD-10-CM | POA: Diagnosis not present

## 2016-10-17 DIAGNOSIS — J9621 Acute and chronic respiratory failure with hypoxia: Secondary | ICD-10-CM | POA: Diagnosis not present

## 2016-10-17 DIAGNOSIS — Z7901 Long term (current) use of anticoagulants: Secondary | ICD-10-CM | POA: Diagnosis not present

## 2016-10-17 DIAGNOSIS — E0965 Drug or chemical induced diabetes mellitus with hyperglycemia: Secondary | ICD-10-CM | POA: Diagnosis not present

## 2016-10-17 DIAGNOSIS — Z9981 Dependence on supplemental oxygen: Secondary | ICD-10-CM | POA: Diagnosis not present

## 2016-10-17 DIAGNOSIS — I482 Chronic atrial fibrillation: Secondary | ICD-10-CM | POA: Diagnosis not present

## 2016-10-17 DIAGNOSIS — J441 Chronic obstructive pulmonary disease with (acute) exacerbation: Secondary | ICD-10-CM | POA: Diagnosis not present

## 2016-10-24 ENCOUNTER — Other Ambulatory Visit: Payer: Self-pay

## 2016-10-24 NOTE — Patient Outreach (Signed)
[facility]) Care Management  10/24/2016   Eileen [name] 12-31-1945 [id number]  Subjective:  I am doing Ok. I cannot think of a thing I need Objective:  71 year old female referred for low sodium, COPD Education  Current Medications:  Current Outpatient Prescriptions  Medication Sig Dispense Refill  . acetaminophen (TYLENOL) 500 MG tablet Take 500 mg by mouth every other day.    . Ascorbic Acid (VITAMIN C PO) Take 1 tablet by mouth daily.    . blood glucose meter kit and supplies KIT Dispense based on patient and insurance preference. Use up to four times daily as directed. (FOR ICD-9 250.00, 250.01).  Check sugar before meals and at night, keep a log. 1 each 0  . diltiazem (CARDIZEM CD) 240 MG 24 hr capsule TAKE ONE CAPSULE BY MOUTH DAILY. 30 capsule 6  . Fluticasone-Salmeterol (ADVAIR DISKUS) 250-50 MCG/DOSE AEPB Inhale 1 puff into the lungs 2 (two) times daily. 60 each 0  . furosemide (LASIX) 40 MG tablet Take 1 tablet by mouth daily as needed for fluid or edema.   5  . ipratropium-albuterol (DUONEB) 0.5-2.5 (3) MG/3ML SOLN Take 3 mLs by nebulization every 6 (six) hours as needed (as needed for shortness of breath.). 360 mL 0  . metFORMIN (GLUCOPHAGE) 500 MG tablet Take 1 tablet (500 mg total) by mouth daily with breakfast. 30 tablet 0  . metoprolol tartrate (LOPRESSOR) 50 MG tablet Take 1 tablet (50 mg total) by mouth 2 (two) times daily. 30 tablet 0  . Multiple Vitamin (MULTIVITAMIN WITH MINERALS) TABS tablet Take 1 tablet by mouth every other day.    . OXYGEN Inhale 2 L into the lungs daily. At night and daily as needed    . predniSONE (DELTASONE) 10 MG tablet Take 4 tablets daily for 2 days, then take 2 tablets daily for 2 days, then resume 5 mg tablets, 2 per day. 12 tablet 0  . predniSONE (DELTASONE) 5 MG tablet Take 2 tablets (10 mg total) by mouth daily. 30 tablet 0  . PROAIR HFA 108 (90 BASE) MCG/ACT inhaler Inhale 2 puffs into the lungs every 6 (six)  hours as needed for wheezing or shortness of breath.     . telmisartan-hydrochlorothiazide (MICARDIS HCT) 80-12.5 MG tablet Take 0.5 tablets by mouth daily.  1  . Vitamin D, Ergocalciferol, (DRISDOL) 50000 units CAPS capsule Take 50,000 Units by mouth every 30 (thirty) days.    Alveda Reasons 20 MG TABS tablet TAKE ONE TABLET BY MOUTH DAILY WITH SUPPER. 30 tablet 6   No current facility-administered medications for this visit.     Functional Status:  In your present state of health, do you have any difficulty performing the following activities: 10/24/2016 10/03/2016  Hearing? - N  Vision? - Y  Difficulty concentrating or making decisions? N -  Walking or climbing stairs? - Y  Dressing or bathing? - N  Doing errands, shopping? - Y  Conservation officer, nature and eating ? - N  Using the Toilet? - N  In the past six months, have you accidently leaked urine? - N  Do you have problems with loss of bowel control? - N  Managing your Medications? - N  Managing your Finances? - N  Housekeeping or managing your Housekeeping? - Y  Some recent data might be hidden    Fall/Depression Screening: Fall Risk  10/03/2016  Falls in the past year? Yes  Number falls in past yr: 1  Injury with Fall? No  Risk for fall due to : History of fall(s);Impaired mobility;Impaired vision;Medication side effect;Impaired balance/gait  Follow up Follow up appointment   PHQ 2/9 Scores 10/03/2016  PHQ - 2 Score 0   THN CM Care Plan Problem One     Most Recent Value  Care Plan Problem One  patient had recent acute care admission for COPD Exaceration  Role Documenting the Problem One  Care Management Coordinator  Care Plan for Problem One  Active  THN Long Term Goal   patient will have no acute care admission in the next 31 days  THN Long Term Goal Start Date  10/02/16  Interventions for Problem One Long Term Goal  8/14 patient advised this RNCM she has had not acute care admissions since our last encounter  [facility] CM Short Term  Goal #1   patient will meet with [facility] RNCM for COPD Education  [facility] CM Short Term Goal #1 Start Date  10/02/16  [facility] CM Short Term Goal #1 Met Date  10/12/16  Interventions for Short Term Goal #1  8/14 Goal met duirng initial home visit on 10/12/2016    [facility] CM Care Plan Problem Two     Most Recent Value  Care Plan Problem Two  patient was discharge with instructions for a low sodium diet, however, she lacks knowledge related to low sodium diet  Role Documenting the Problem Two  Care Management [place] for Problem Two  Active  Interventions for Problem Two Long Term Goal   8/02 patient met with this RNCM for low sodium diet  during this home viisit   THN Long Term Goal  In the next 31 days, patient will meet iwith the [facility] RNCM for low sodium diet education  [facility] Long Term Goal Start Date  10/02/16  [facility] Long Term Goal Met Date  10/12/16     Assessment:  Patient has met with this RNCM for COPD and low sodium diet education. Patient denies need for case management at this time. Patient reports being compliant with medical appointments and medication regimen.  Plan:  Telephone contact in the next 28 days to assess need for community care coordination

## 2016-10-25 DIAGNOSIS — J9621 Acute and chronic respiratory failure with hypoxia: Secondary | ICD-10-CM | POA: Diagnosis not present

## 2016-10-25 DIAGNOSIS — Z9981 Dependence on supplemental oxygen: Secondary | ICD-10-CM | POA: Diagnosis not present

## 2016-10-25 DIAGNOSIS — Z7901 Long term (current) use of anticoagulants: Secondary | ICD-10-CM | POA: Diagnosis not present

## 2016-10-25 DIAGNOSIS — I10 Essential (primary) hypertension: Secondary | ICD-10-CM | POA: Diagnosis not present

## 2016-10-25 DIAGNOSIS — I4892 Unspecified atrial flutter: Secondary | ICD-10-CM | POA: Diagnosis not present

## 2016-10-25 DIAGNOSIS — Z7984 Long term (current) use of oral hypoglycemic drugs: Secondary | ICD-10-CM | POA: Diagnosis not present

## 2016-10-25 DIAGNOSIS — J441 Chronic obstructive pulmonary disease with (acute) exacerbation: Secondary | ICD-10-CM | POA: Diagnosis not present

## 2016-10-25 DIAGNOSIS — E0965 Drug or chemical induced diabetes mellitus with hyperglycemia: Secondary | ICD-10-CM | POA: Diagnosis not present

## 2016-10-25 DIAGNOSIS — I482 Chronic atrial fibrillation: Secondary | ICD-10-CM | POA: Diagnosis not present

## 2016-10-29 DIAGNOSIS — I4892 Unspecified atrial flutter: Secondary | ICD-10-CM | POA: Diagnosis not present

## 2016-10-29 DIAGNOSIS — I480 Paroxysmal atrial fibrillation: Secondary | ICD-10-CM | POA: Diagnosis not present

## 2016-10-29 DIAGNOSIS — I10 Essential (primary) hypertension: Secondary | ICD-10-CM | POA: Diagnosis not present

## 2016-10-29 DIAGNOSIS — J449 Chronic obstructive pulmonary disease, unspecified: Secondary | ICD-10-CM | POA: Diagnosis not present

## 2016-10-30 DIAGNOSIS — K649 Unspecified hemorrhoids: Secondary | ICD-10-CM | POA: Diagnosis not present

## 2016-10-30 DIAGNOSIS — K5900 Constipation, unspecified: Secondary | ICD-10-CM | POA: Diagnosis not present

## 2016-10-31 DIAGNOSIS — I4892 Unspecified atrial flutter: Secondary | ICD-10-CM | POA: Diagnosis not present

## 2016-10-31 DIAGNOSIS — J441 Chronic obstructive pulmonary disease with (acute) exacerbation: Secondary | ICD-10-CM | POA: Diagnosis not present

## 2016-10-31 DIAGNOSIS — I482 Chronic atrial fibrillation: Secondary | ICD-10-CM | POA: Diagnosis not present

## 2016-10-31 DIAGNOSIS — I10 Essential (primary) hypertension: Secondary | ICD-10-CM | POA: Diagnosis not present

## 2016-10-31 DIAGNOSIS — K5900 Constipation, unspecified: Secondary | ICD-10-CM | POA: Diagnosis not present

## 2016-10-31 DIAGNOSIS — Z7901 Long term (current) use of anticoagulants: Secondary | ICD-10-CM | POA: Diagnosis not present

## 2016-10-31 DIAGNOSIS — J9621 Acute and chronic respiratory failure with hypoxia: Secondary | ICD-10-CM | POA: Diagnosis not present

## 2016-10-31 DIAGNOSIS — Z7984 Long term (current) use of oral hypoglycemic drugs: Secondary | ICD-10-CM | POA: Diagnosis not present

## 2016-10-31 DIAGNOSIS — E0965 Drug or chemical induced diabetes mellitus with hyperglycemia: Secondary | ICD-10-CM | POA: Diagnosis not present

## 2016-10-31 DIAGNOSIS — Z9981 Dependence on supplemental oxygen: Secondary | ICD-10-CM | POA: Diagnosis not present

## 2016-11-08 DIAGNOSIS — I4892 Unspecified atrial flutter: Secondary | ICD-10-CM | POA: Diagnosis not present

## 2016-11-08 DIAGNOSIS — I482 Chronic atrial fibrillation: Secondary | ICD-10-CM | POA: Diagnosis not present

## 2016-11-08 DIAGNOSIS — I10 Essential (primary) hypertension: Secondary | ICD-10-CM | POA: Diagnosis not present

## 2016-11-08 DIAGNOSIS — Z9981 Dependence on supplemental oxygen: Secondary | ICD-10-CM | POA: Diagnosis not present

## 2016-11-08 DIAGNOSIS — Z7901 Long term (current) use of anticoagulants: Secondary | ICD-10-CM | POA: Diagnosis not present

## 2016-11-08 DIAGNOSIS — J9621 Acute and chronic respiratory failure with hypoxia: Secondary | ICD-10-CM | POA: Diagnosis not present

## 2016-11-08 DIAGNOSIS — E0965 Drug or chemical induced diabetes mellitus with hyperglycemia: Secondary | ICD-10-CM | POA: Diagnosis not present

## 2016-11-08 DIAGNOSIS — J441 Chronic obstructive pulmonary disease with (acute) exacerbation: Secondary | ICD-10-CM | POA: Diagnosis not present

## 2016-11-08 DIAGNOSIS — Z7984 Long term (current) use of oral hypoglycemic drugs: Secondary | ICD-10-CM | POA: Diagnosis not present

## 2016-11-10 DIAGNOSIS — R0902 Hypoxemia: Secondary | ICD-10-CM | POA: Diagnosis not present

## 2016-11-10 DIAGNOSIS — J449 Chronic obstructive pulmonary disease, unspecified: Secondary | ICD-10-CM | POA: Diagnosis not present

## 2016-11-16 ENCOUNTER — Ambulatory Visit (INDEPENDENT_AMBULATORY_CARE_PROVIDER_SITE_OTHER): Payer: Medicare Other | Admitting: Cardiovascular Disease

## 2016-11-16 ENCOUNTER — Encounter: Payer: Self-pay | Admitting: Cardiovascular Disease

## 2016-11-16 VITALS — BP 120/68 | HR 93 | Ht 62.0 in | Wt 185.0 lb

## 2016-11-16 DIAGNOSIS — I10 Essential (primary) hypertension: Secondary | ICD-10-CM

## 2016-11-16 DIAGNOSIS — I4891 Unspecified atrial fibrillation: Secondary | ICD-10-CM

## 2016-11-16 DIAGNOSIS — Z9289 Personal history of other medical treatment: Secondary | ICD-10-CM

## 2016-11-16 MED ORDER — RIVAROXABAN 20 MG PO TABS: 20.0000 mg | ORAL_TABLET | Freq: Every day | ORAL | 0 refills | Status: DC

## 2016-11-16 NOTE — Progress Notes (Signed)
SUBJECTIVE: The patient presents for routine follow-up. She was hospitalized for COPD exacerbation in July 2018. She has a history of atrial flutter and atrial fibrillation.  She denies chest pain and palpitations. She now uses 2.5 and up to 3 L of oxygen.  I independently reviewed ECG performed on 09/24/16 which showed rapid atrial fibrillation, 137 bpm.   Review of Systems: As per "subjective", otherwise negative.  Allergies  Allergen Reactions  . Codeine Other (See Comments)    "jittery"    Current Outpatient Prescriptions  Medication Sig Dispense Refill  . acetaminophen (TYLENOL) 500 MG tablet Take 500 mg by mouth every other day.    . Ascorbic Acid (VITAMIN C PO) Take 1 tablet by mouth daily.    . blood glucose meter kit and supplies KIT Dispense based on patient and insurance preference. Use up to four times daily as directed. (FOR ICD-9 250.00, 250.01).  Check sugar before meals and at night, keep a log. 1 each 0  . diltiazem (CARDIZEM CD) 240 MG 24 hr capsule TAKE ONE CAPSULE BY MOUTH DAILY. 30 capsule 6  . Fluticasone-Salmeterol (ADVAIR) 250-50 MCG/DOSE AEPB Inhale 1 puff into the lungs 2 (two) times daily.    . furosemide (LASIX) 40 MG tablet Take 1 tablet by mouth daily as needed for fluid or edema.   5  . ipratropium-albuterol (DUONEB) 0.5-2.5 (3) MG/3ML SOLN Take 3 mLs by nebulization.    . metFORMIN (GLUCOPHAGE) 500 MG tablet Take by mouth 2 (two) times daily with a meal.    . metoprolol tartrate (LOPRESSOR) 50 MG tablet Take 1 tablet (50 mg total) by mouth 2 (two) times daily. 30 tablet 0  . Multiple Vitamin (MULTIVITAMIN WITH MINERALS) TABS tablet Take 1 tablet by mouth every other day.    . OXYGEN Inhale 2 L into the lungs daily. At night and daily as needed    . predniSONE (DELTASONE) 10 MG tablet Take 4 tablets daily for 2 days, then take 2 tablets daily for 2 days, then resume 5 mg tablets, 2 per day. 12 tablet 0  . PROAIR HFA 108 (90 BASE) MCG/ACT inhaler  Inhale 2 puffs into the lungs every 6 (six) hours as needed for wheezing or shortness of breath.     . telmisartan-hydrochlorothiazide (MICARDIS HCT) 80-12.5 MG tablet Take 0.5 tablets by mouth daily.  1  . Vitamin D, Ergocalciferol, (DRISDOL) 50000 units CAPS capsule Take 50,000 Units by mouth every 30 (thirty) days.    Alveda Reasons 20 MG TABS tablet TAKE ONE TABLET BY MOUTH DAILY WITH SUPPER. 30 tablet 6   No current facility-administered medications for this visit.     Past Medical History:  Diagnosis Date  . Asthma   . COPD (chronic obstructive pulmonary disease) ([place])   . History of breast cancer    right breast  . Hypertension     Past Surgical History:  Procedure Laterality Date  . CESAREAN SECTION    . HERNIA REPAIR     RIH  . MASTECTOMY  2011   right breast    Social History   Social History  . Marital status: Single    Spouse name: N/A  . Number of children: N/A  . Years of education: N/A   Occupational History  . Not on file.   Social History Main Topics  . Smoking status: Former Smoker    Packs/day: 0.50    Years: 32.00    Types: Cigarettes    Start date:  12/12/1962    Quit date: 03/13/1994  . Smokeless tobacco: Never Used  . Alcohol use No  . Drug use: No  . Sexual activity: Not on file   Other Topics Concern  . Not on file   Social History Narrative  . No narrative on file     Vitals:   11/16/16 1345  BP: 120/68  Pulse: 93  SpO2: 90%  Weight: 185 lb (83.9 kg)  Height: 5' 2"  (1.575 m)    Wt Readings from Last 3 Encounters:  11/16/16 185 lb (83.9 kg)  09/22/16 187 lb 3.2 oz (84.9 kg)  12/02/15 138 lb (62.6 kg)     PHYSICAL EXAM General: NAD HEENT: Normal. Neck: No JVD, no thyromegaly. Lungs: Lungs: Diminished throughout, no rales or wheezes. CV: Nondisplaced PMI.  Regular rate and rhythm, normal S1/S2, no S3/S4, no murmur. No pretibial or periankle edema.  No carotid bruit.   Abdomen: Soft, nontender, no distention.  Neurologic:  Alert and oriented.  Psych: Normal affect. Skin: Normal. Musculoskeletal: No gross deformities.    ECG: Most recent ECG reviewed.   Labs: Lab Results  Component Value Date/Time   K 4.8 09/28/2016 05:03 AM   BUN 23 (H) 09/28/2016 05:03 AM   CREATININE 0.72 09/28/2016 05:03 AM   ALT 18 09/23/2016 05:41 AM   TSH 0.226 (L) 09/23/2016 05:42 AM   HGB 13.0 09/27/2016 04:59 AM     Lipids: Lab Results  Component Value Date/Time   LDLCALC 102 (H) 03/23/2015 04:20 AM   CHOL 204 (H) 03/23/2015 04:20 AM   TRIG 86 03/23/2015 04:20 AM   HDL 85 03/23/2015 04:20 AM       ASSESSMENT AND PLAN: 1. Atrial flutter and fibrillation: Symptomatically stable and in a regular rhythm. Continue Xarelto 20 mg daily, metoprolol 50 mg twice daily, and long-acting diltiazem 240 mg daily.  2. Essential HTN: Controlled. No changes.      Disposition: Follow up 1 yr.   Kate Sable, M.D., F.A.C.C.

## 2016-11-16 NOTE — Patient Instructions (Signed)

## 2016-11-16 NOTE — Addendum Note (Signed)
Addended by: Laurine Blazer on: 11/16/2016 04:52 PM   Modules accepted: Orders

## 2016-11-17 DIAGNOSIS — K649 Unspecified hemorrhoids: Secondary | ICD-10-CM | POA: Diagnosis not present

## 2016-11-24 ENCOUNTER — Encounter: Payer: Self-pay | Admitting: *Deleted

## 2016-11-24 ENCOUNTER — Ambulatory Visit: Payer: Self-pay

## 2016-11-24 ENCOUNTER — Other Ambulatory Visit: Payer: Self-pay | Admitting: *Deleted

## 2016-11-24 NOTE — Patient Outreach (Signed)
Telephone call to patient for telephone assessment, no answer to telephone [phone] or [phone], left voicemail on both phones requesting return phone call.  PLAN Outreach pt next week  Jacqlyn Larsen [facility], [place] Coordinator [phone]

## 2016-11-24 NOTE — Patient Outreach (Signed)
Patient called RN CM back for telephone assessment, spoke with pt, HIPAA verified, pt reports " I'm doing really well", pt states she has all medications and taking as prescribed, attending all appointments, saw primary care MD approximately 2 weeks ago and no changes made, pt states she is following low sodium diet, pt not interested in any further phone calls/ outreach from [facility] care management citing she has no further needs.  RN CM mailed case closure letter to patient's home, faxed case closure letter to primary MD.  [facility] CM Care Plan Problem One     Most Recent Value  Care Plan Problem One  patient had recent acute care admission for COPD Exaceration  Role Documenting the Problem One  Care Management [place] for Problem One  Active  [facility] Long Term Goal   patient will have no acute care admission in the next 31 days  THN Long Term Goal Start Date  10/02/16  [facility] Long Term Goal Met Date  11/24/16  Interventions for Problem One Long Term Goal  Pt reports she has had no ED or hospital admissions since last contact with RN CM, Pt states she is doing well and has no further needs.  THN CM Short Term Goal #1   patient will meet with [facility] RNCM for COPD Education  [facility] CM Short Term Goal #1 Start Date  10/02/16  [facility] CM Short Term Goal #1 Met Date  10/12/16    [facility] CM Care Plan Problem Two     Most Recent Value  Care Plan Problem Two  patient was discharge with instructions for a low sodium diet, however, she lacks knowledge related to low sodium diet  Role Documenting the Problem Two  Care Management [place] for Problem Two  Active  THN Long Term Goal  In the next 31 days, patient will meet iwith the [facility] RNCM for low sodium diet education  [facility] Long Term Goal Start Date  10/02/16  [facility] Long Term Goal Met Date  10/12/16     Discharge today  Jacqlyn Larsen [facility], BSN [place] Coordinator [phone]

## 2016-12-01 ENCOUNTER — Ambulatory Visit: Payer: Medicare Other | Admitting: *Deleted

## 2016-12-01 ENCOUNTER — Ambulatory Visit: Payer: Self-pay

## 2016-12-10 DIAGNOSIS — J449 Chronic obstructive pulmonary disease, unspecified: Secondary | ICD-10-CM | POA: Diagnosis not present

## 2016-12-10 DIAGNOSIS — R0902 Hypoxemia: Secondary | ICD-10-CM | POA: Diagnosis not present

## 2017-01-03 DIAGNOSIS — I4892 Unspecified atrial flutter: Secondary | ICD-10-CM | POA: Diagnosis not present

## 2017-01-03 DIAGNOSIS — J449 Chronic obstructive pulmonary disease, unspecified: Secondary | ICD-10-CM | POA: Diagnosis not present

## 2017-01-03 DIAGNOSIS — R350 Frequency of micturition: Secondary | ICD-10-CM | POA: Diagnosis not present

## 2017-01-03 DIAGNOSIS — I10 Essential (primary) hypertension: Secondary | ICD-10-CM | POA: Diagnosis not present

## 2017-01-03 DIAGNOSIS — J441 Chronic obstructive pulmonary disease with (acute) exacerbation: Secondary | ICD-10-CM | POA: Diagnosis not present

## 2017-01-03 DIAGNOSIS — I480 Paroxysmal atrial fibrillation: Secondary | ICD-10-CM | POA: Diagnosis not present

## 2017-01-05 ENCOUNTER — Encounter (INDEPENDENT_AMBULATORY_CARE_PROVIDER_SITE_OTHER): Payer: Self-pay | Admitting: Internal Medicine

## 2017-01-10 DIAGNOSIS — R0902 Hypoxemia: Secondary | ICD-10-CM | POA: Diagnosis not present

## 2017-01-10 DIAGNOSIS — J449 Chronic obstructive pulmonary disease, unspecified: Secondary | ICD-10-CM | POA: Diagnosis not present

## 2017-01-19 ENCOUNTER — Other Ambulatory Visit: Payer: Self-pay

## 2017-01-19 NOTE — Patient Outreach (Signed)
[facility]) Care Management  01/19/2017  [name] Gorr [birth year] [id number]   Medication Adherence call to Mrs. [place] Streicher patient is showing past due under [place] health Care Ins.on Metformin ER 500 mg spoke to patient she said [employer]	[place] [facility] two days ago and she is no longer taking regular  Metformin 500 mg, she is no longer taking Atorvastatin 10 mg she is now taking Rosuvastatin 10 mg patient just fill this medication .   [place] Management Direct Dial [phone]  Fax [phone] [email or web address]

## 2017-01-24 ENCOUNTER — Encounter (INDEPENDENT_AMBULATORY_CARE_PROVIDER_SITE_OTHER): Payer: Self-pay

## 2017-01-24 ENCOUNTER — Ambulatory Visit (INDEPENDENT_AMBULATORY_CARE_PROVIDER_SITE_OTHER): Payer: Medicare Other | Admitting: Internal Medicine

## 2017-01-24 ENCOUNTER — Encounter (INDEPENDENT_AMBULATORY_CARE_PROVIDER_SITE_OTHER): Payer: Self-pay | Admitting: Internal Medicine

## 2017-01-24 ENCOUNTER — Encounter (INDEPENDENT_AMBULATORY_CARE_PROVIDER_SITE_OTHER): Payer: Self-pay | Admitting: *Deleted

## 2017-01-24 VITALS — BP 160/90 | HR 80 | Temp 98.1°F | Ht 63.0 in | Wt 183.5 lb

## 2017-01-24 DIAGNOSIS — R1319 Other dysphagia: Secondary | ICD-10-CM

## 2017-01-24 DIAGNOSIS — R1310 Dysphagia, unspecified: Secondary | ICD-10-CM

## 2017-01-24 NOTE — Patient Instructions (Signed)
DG esophagram.   

## 2017-01-24 NOTE — Progress Notes (Signed)
Subjective:    Patient ID: Eileen [name], female    DOB: 12-09-1945, 71 y.o.   MRN: [id number]  HPI Referred by Aggie Hacker PA for dysphagia. When she eats, she sometimes coughs and sneezes. Does not occur often. Sometimes foods feel like they lodge in her esophagus.  Symptoms for 2-3 months.  She denies any acid reflux. Her appetite is good. No abdominal pain. Has a BM every 2 days. No melena or BRRB.  Retired from [employer]  Hx of COPD and maintained on 02 at 2 1/2 L/[place] Hx of atrial flutter and maintained on Xarelto.  Review of Systems Past Medical History:  Diagnosis Date  . Asthma   . COPD (chronic obstructive pulmonary disease) ([place])   . History of breast cancer    right breast  . Hypertension     Past Surgical History:  Procedure Laterality Date  . CESAREAN SECTION    . HERNIA REPAIR     RIH  . MASTECTOMY  2011   right breast    Allergies  Allergen Reactions  . Codeine Other (See Comments)    "jittery"    Current Outpatient Medications on File Prior to Visit  Medication Sig Dispense Refill  . acetaminophen (TYLENOL) 500 MG tablet Take 500 mg by mouth every other day.    . Ascorbic Acid (VITAMIN C PO) Take 1 tablet by mouth daily.    . blood glucose meter kit and supplies KIT Dispense based on patient and insurance preference. Use up to four times daily as directed. (FOR ICD-9 250.00, 250.01).  Check sugar before meals and at night, keep a log. 1 each 0  . cholecalciferol (VITAMIN D) 400 units TABS tablet Take 1,000 Units by mouth.    . diltiazem (CARDIZEM CD) 240 MG 24 hr capsule TAKE ONE CAPSULE BY MOUTH DAILY. 30 capsule 6  . Fluticasone-Salmeterol (ADVAIR) 250-50 MCG/DOSE AEPB Inhale 1 puff into the lungs 2 (two) times daily.    . furosemide (LASIX) 40 MG tablet Take 1 tablet by mouth daily as needed for fluid or edema.   5  . Garlic (GARLIQUE PO) Take daily by mouth.    Marland Kitchen ipratropium-albuterol (DUONEB) 0.5-2.5 (3) MG/3ML SOLN Take 3 mLs by nebulization.     . metFORMIN (GLUCOPHAGE) 500 MG tablet Take by mouth 2 (two) times daily with a meal.    . metoprolol tartrate (LOPRESSOR) 50 MG tablet Take 1 tablet (50 mg total) by mouth 2 (two) times daily. 30 tablet 0  . Multiple Vitamin (MULTIVITAMIN WITH MINERALS) TABS tablet Take 1 tablet by mouth every other day.    . OXYGEN Inhale 2 L into the lungs daily. At night and daily as needed    . predniSONE (DELTASONE) 10 MG tablet Take 4 tablets daily for 2 days, then take 2 tablets daily for 2 days, then resume 5 mg tablets, 2 per day. 12 tablet 0  . PROAIR HFA 108 (90 BASE) MCG/ACT inhaler Inhale 2 puffs into the lungs every 6 (six) hours as needed for wheezing or shortness of breath.     . Red Yeast Rice Extract (RED YEAST RICE PO) Take daily by mouth.    . rivaroxaban (XARELTO) 20 MG TABS tablet Take 1 tablet (20 mg total) by mouth daily with supper. 28 tablet 0  . telmisartan-hydrochlorothiazide (MICARDIS HCT) 80-12.5 MG tablet Take 0.5 tablets as needed by mouth.   1  . Vitamin D, Ergocalciferol, (DRISDOL) 50000 units CAPS capsule Take 50,000 Units every 30 (thirty)  days by mouth.     No current facility-administered medications on file prior to visit.         Objective:   Physical Exam  Blood pressure (!) 160/90, pulse 80, temperature 98.1 F (36.7 C), height 5' 3"  (1.6 m), weight 183 lb 8 oz (83.2 kg). Alert and oriented. Skin warm and dry. Oral mucosa is moist.   . Sclera anicteric, conjunctivae is pink. Thyroid not enlarged. No cervical lymphadenopathy. Lungs clear. Heart regular rate and rhythm.  Abdomen is soft. Bowel sounds are positive. No hepatomegaly. No abdominal masses felt. No tenderness.  No edema to lower extremities.          Assessment & Plan:  Dysphagia. Possibly motility problem.  Will get a DG esophagram.  Further recommendations to follow.  Will ask for last colonoscopy report.

## 2017-01-24 NOTE — Addendum Note (Signed)
Addended by: Butch Penny on: 01/24/2017 02:36 PM   Modules accepted: Orders

## 2017-01-29 ENCOUNTER — Ambulatory Visit (HOSPITAL_COMMUNITY)
Admission: RE | Admit: 2017-01-29 | Discharge: 2017-01-29 | Disposition: A | Discharge status: Home / Self Care | Payer: Medicare Other | Source: Ambulatory Visit | Attending: Internal Medicine | Admitting: Internal Medicine

## 2017-01-29 DIAGNOSIS — R1310 Dysphagia, unspecified: Secondary | ICD-10-CM

## 2017-01-29 DIAGNOSIS — R1319 Other dysphagia: Secondary | ICD-10-CM

## 2017-01-30 DIAGNOSIS — I480 Paroxysmal atrial fibrillation: Secondary | ICD-10-CM | POA: Diagnosis not present

## 2017-01-30 DIAGNOSIS — E559 Vitamin D deficiency, unspecified: Secondary | ICD-10-CM | POA: Diagnosis not present

## 2017-01-30 DIAGNOSIS — J449 Chronic obstructive pulmonary disease, unspecified: Secondary | ICD-10-CM | POA: Diagnosis not present

## 2017-01-30 DIAGNOSIS — I10 Essential (primary) hypertension: Secondary | ICD-10-CM | POA: Diagnosis not present

## 2017-02-09 DIAGNOSIS — J449 Chronic obstructive pulmonary disease, unspecified: Secondary | ICD-10-CM | POA: Diagnosis not present

## 2017-02-09 DIAGNOSIS — R0902 Hypoxemia: Secondary | ICD-10-CM | POA: Diagnosis not present

## 2017-02-27 DIAGNOSIS — Z7984 Long term (current) use of oral hypoglycemic drugs: Secondary | ICD-10-CM | POA: Diagnosis not present

## 2017-02-27 DIAGNOSIS — H5203 Hypermetropia, bilateral: Secondary | ICD-10-CM | POA: Diagnosis not present

## 2017-02-27 DIAGNOSIS — E119 Type 2 diabetes mellitus without complications: Secondary | ICD-10-CM | POA: Diagnosis not present

## 2017-02-27 DIAGNOSIS — H2513 Age-related nuclear cataract, bilateral: Secondary | ICD-10-CM | POA: Diagnosis not present

## 2017-02-27 DIAGNOSIS — H52203 Unspecified astigmatism, bilateral: Secondary | ICD-10-CM | POA: Diagnosis not present

## 2017-03-12 DIAGNOSIS — R0902 Hypoxemia: Secondary | ICD-10-CM | POA: Diagnosis not present

## 2017-03-12 DIAGNOSIS — J449 Chronic obstructive pulmonary disease, unspecified: Secondary | ICD-10-CM | POA: Diagnosis not present

## 2017-04-03 ENCOUNTER — Other Ambulatory Visit: Payer: Self-pay | Admitting: Cardiovascular Disease

## 2017-04-12 DIAGNOSIS — J449 Chronic obstructive pulmonary disease, unspecified: Secondary | ICD-10-CM | POA: Diagnosis not present

## 2017-04-12 DIAGNOSIS — J441 Chronic obstructive pulmonary disease with (acute) exacerbation: Secondary | ICD-10-CM | POA: Diagnosis not present

## 2017-04-12 DIAGNOSIS — R0902 Hypoxemia: Secondary | ICD-10-CM | POA: Diagnosis not present

## 2017-04-12 DIAGNOSIS — K649 Unspecified hemorrhoids: Secondary | ICD-10-CM | POA: Diagnosis not present

## 2017-05-02 ENCOUNTER — Other Ambulatory Visit: Payer: Self-pay | Admitting: Cardiovascular Disease

## 2017-05-10 DIAGNOSIS — J449 Chronic obstructive pulmonary disease, unspecified: Secondary | ICD-10-CM | POA: Diagnosis not present

## 2017-05-10 DIAGNOSIS — R0902 Hypoxemia: Secondary | ICD-10-CM | POA: Diagnosis not present

## 2017-05-30 DIAGNOSIS — I4892 Unspecified atrial flutter: Secondary | ICD-10-CM | POA: Diagnosis not present

## 2017-05-30 DIAGNOSIS — J449 Chronic obstructive pulmonary disease, unspecified: Secondary | ICD-10-CM | POA: Diagnosis not present

## 2017-05-30 DIAGNOSIS — I10 Essential (primary) hypertension: Secondary | ICD-10-CM | POA: Diagnosis not present

## 2017-05-30 DIAGNOSIS — J441 Chronic obstructive pulmonary disease with (acute) exacerbation: Secondary | ICD-10-CM | POA: Diagnosis not present

## 2017-05-30 DIAGNOSIS — I480 Paroxysmal atrial fibrillation: Secondary | ICD-10-CM | POA: Diagnosis not present

## 2017-06-10 DIAGNOSIS — J449 Chronic obstructive pulmonary disease, unspecified: Secondary | ICD-10-CM | POA: Diagnosis not present

## 2017-06-10 DIAGNOSIS — R0902 Hypoxemia: Secondary | ICD-10-CM | POA: Diagnosis not present

## 2017-07-10 DIAGNOSIS — R0902 Hypoxemia: Secondary | ICD-10-CM | POA: Diagnosis not present

## 2017-07-10 DIAGNOSIS — J449 Chronic obstructive pulmonary disease, unspecified: Secondary | ICD-10-CM | POA: Diagnosis not present

## 2017-07-17 ENCOUNTER — Encounter (HOSPITAL_COMMUNITY): Payer: Self-pay | Admitting: Emergency Medicine

## 2017-07-17 ENCOUNTER — Other Ambulatory Visit: Payer: Self-pay

## 2017-07-17 ENCOUNTER — Emergency Department (HOSPITAL_COMMUNITY)
Admission: EM | Admit: 2017-07-17 | Discharge: 2017-07-17 | Disposition: A | Discharge status: Home / Self Care | Payer: Medicare Other | Attending: Emergency Medicine | Admitting: Emergency Medicine

## 2017-07-17 ENCOUNTER — Emergency Department (HOSPITAL_COMMUNITY): Payer: Medicare Other

## 2017-07-17 DIAGNOSIS — Z79899 Other long term (current) drug therapy: Secondary | ICD-10-CM | POA: Diagnosis not present

## 2017-07-17 DIAGNOSIS — I10 Essential (primary) hypertension: Secondary | ICD-10-CM | POA: Diagnosis not present

## 2017-07-17 DIAGNOSIS — J441 Chronic obstructive pulmonary disease with (acute) exacerbation: Secondary | ICD-10-CM | POA: Diagnosis not present

## 2017-07-17 DIAGNOSIS — Z87891 Personal history of nicotine dependence: Secondary | ICD-10-CM | POA: Diagnosis not present

## 2017-07-17 DIAGNOSIS — R0602 Shortness of breath: Secondary | ICD-10-CM | POA: Insufficient documentation

## 2017-07-17 LAB — CBC WITH DIFFERENTIAL/PLATELET
BASOS ABS: 0 10*3/uL (ref 0.0–0.1)
BASOS PCT: 0 %
EOS ABS: 0 10*3/uL (ref 0.0–0.7)
Eosinophils Relative: 0 %
HEMATOCRIT: 43.1 % (ref 36.0–46.0)
Hemoglobin: 13.3 g/dL (ref 12.0–15.0)
Lymphocytes Relative: 14 %
Lymphs Abs: 1.5 10*3/uL (ref 0.7–4.0)
MCH: 30.2 pg (ref 26.0–34.0)
MCHC: 30.9 g/dL (ref 30.0–36.0)
MCV: 98 fL (ref 78.0–100.0)
MONO ABS: 0.3 10*3/uL (ref 0.1–1.0)
Monocytes Relative: 3 %
NEUTROS ABS: 8.4 10*3/uL — AB (ref 1.7–7.7)
NEUTROS PCT: 83 %
Platelets: 250 10*3/uL (ref 150–400)
RBC: 4.4 MIL/uL (ref 3.87–5.11)
RDW: 13.8 % (ref 11.5–15.5)
WBC: 10.3 10*3/uL (ref 4.0–10.5)

## 2017-07-17 LAB — COMPREHENSIVE METABOLIC PANEL
ALBUMIN: 4.2 g/dL (ref 3.5–5.0)
ALT: 19 U/L (ref 14–54)
AST: 22 U/L (ref 15–41)
Alkaline Phosphatase: 58 U/L (ref 38–126)
Anion gap: 8 (ref 5–15)
BILIRUBIN TOTAL: 0.6 mg/dL (ref 0.3–1.2)
BUN: 12 mg/dL (ref 6–20)
CHLORIDE: 97 mmol/L — AB (ref 101–111)
CO2: 35 mmol/L — AB (ref 22–32)
Calcium: 9.7 mg/dL (ref 8.9–10.3)
Creatinine, Ser: 0.63 mg/dL (ref 0.44–1.00)
GFR calc Af Amer: >60 mL/min (ref >60)
GFR calc non Af Amer: >60 mL/min (ref >60)
GLUCOSE: 146 mg/dL — AB (ref 65–99)
POTASSIUM: 4.7 mmol/L (ref 3.5–5.1)
SODIUM: 140 mmol/L (ref 135–145)
Total Protein: 6.9 g/dL (ref 6.5–8.1)

## 2017-07-17 LAB — TROPONIN I

## 2017-07-17 LAB — BRAIN NATRIURETIC PEPTIDE: B NATRIURETIC PEPTIDE 5: 196 pg/mL — AB (ref 0.0–100.0)

## 2017-07-17 MED ORDER — MAGNESIUM SULFATE 2 GM/50ML IV SOLN
2.0000 g | Freq: Once | INTRAVENOUS | Status: AC
  Administered 2017-07-17: 2 g via INTRAVENOUS
  Filled 2017-07-17: qty 50

## 2017-07-17 MED ORDER — IPRATROPIUM-ALBUTEROL 0.5-2.5 (3) MG/3ML IN SOLN
3.0000 mL | RESPIRATORY_TRACT | Status: AC
  Administered 2017-07-17 (×3): 3 mL via RESPIRATORY_TRACT
  Filled 2017-07-17: qty 3

## 2017-07-17 MED ORDER — ALBUTEROL SULFATE (2.5 MG/3ML) 0.083% IN NEBU
5.0000 mg | INHALATION_SOLUTION | Freq: Once | RESPIRATORY_TRACT | Status: AC
  Administered 2017-07-17: 5 mg via RESPIRATORY_TRACT
  Filled 2017-07-17: qty 6

## 2017-07-17 MED ORDER — AZITHROMYCIN 250 MG PO TABS: 250.0000 mg | ORAL_TABLET | Freq: Every day | ORAL | 0 refills | Status: DC

## 2017-07-17 MED ORDER — PREDNISONE 20 MG PO TABS: ORAL_TABLET | ORAL | 0 refills | Status: DC

## 2017-07-17 MED ORDER — PREDNISONE 50 MG PO TABS
60.0000 mg | ORAL_TABLET | Freq: Once | ORAL | Status: AC
  Administered 2017-07-17: 60 mg via ORAL
  Filled 2017-07-17: qty 1

## 2017-07-17 NOTE — ED Provider Notes (Signed)
Emergency Department Provider Note   I have reviewed the triage vital signs and the nursing notes.   HISTORY  Chief Complaint Shortness of Breath   HPI Eileen [name] is a 72 y.o. female with multiple medical problems as documented below the presents to the emergency department today secondary to shortness of breath. Has been going on for a few days, worsening at night. No significant cough. Not really related to exertion. No CP or fevers.  No other associated or modifying symptoms.    Past Medical History:  Diagnosis Date  . Asthma   . COPD (chronic obstructive pulmonary disease) ([place])   . History of breast cancer    right breast  . Hypertension     Patient Active Problem List   Diagnosis Date Noted  . Atrial fibrillation with RVR ([place]) 09/24/2016  . Atrial flutter ([place]) 09/23/2016  . Acute on chronic respiratory failure ([place]) 03/24/2015  . Metabolic encephalopathy 96/22/2979  . HCAP (healthcare-associated pneumonia) 03/24/2015  . Essential hypertension   . Anticoagulation management encounter   . New onset atrial flutter ([place]) 03/21/2015  . Obesity 03/21/2015  . Atrial flutter with rapid ventricular response ([place]) 03/21/2015  . COPD (chronic obstructive pulmonary disease) ([place]) 12/20/2014  . COPD exacerbation ([place]) 12/20/2014  . DCIS (ductal carcinoma in situ) of breast, right, 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, Arimadex 03/10/2011    Past Surgical History:  Procedure Laterality Date  . CESAREAN SECTION    . HERNIA REPAIR     RIH  . MASTECTOMY  2011   right breast    Current Outpatient Rx  . Order #: [id number] Class: Historical Med  . Order #: [id number] Class: Historical Med  . Order #: [id number] Class: Historical Med  . Order #: [id number] Class: Normal  . Order #: [id number] Class: Historical Med  . Order #: [id number] Class: Historical Med  . Order #: [id number] Class: Normal  . Order #: [id number] Class: Historical Med  . Order #: [id number] Class: Historical Med    . Order #: [id number] Class: Historical Med  . Order #: [id number] Class: Historical Med  . Order #: [id number] Class: Historical Med  . Order #: [id number] Class: Historical Med  . Order #: [id number] Class: Normal  . Order #: [id number] Class: Print  . Order #: [id number] Class: Normal  . Order #: [id number] Class: Print    Allergies Codeine  Family History  Problem Relation Age of Onset  . Cancer Mother        lung  . Diabetes Brother     Social History Social History   Tobacco Use  . Smoking status: Former Smoker    Packs/day: 0.50    Years: 32.00    Pack years: 16.00    Types: Cigarettes    Start date: 12/12/1962    Last attempt to quit: 03/13/1994    Years since quitting: 23.3  . Smokeless tobacco: Never Used  Substance Use Topics  . Alcohol use: No    Alcohol/week: 0.0 oz  . Drug use: No    Review of Systems  All other systems negative except as documented in the HPI. All pertinent positives and negatives as reviewed in the HPI. ____________________________________________   PHYSICAL EXAM:  VITAL SIGNS: ED Triage Vitals  Enc Vitals Group     BP 07/17/17 1610 136/69     Pulse Rate 07/17/17 1610 86     Resp 07/17/17 1610 19     Temp 07/17/17 1610 97.7 F (36.5 C)     Temp Source 07/17/17 1610 Oral     SpO2 07/17/17 1610 92 %  Weight 07/17/17 1609 183 lb (83 kg)     Height 07/17/17 1609 5\' 2"  (1.575 m)     Head Circumference --      Peak Flow --      Pain Score 07/17/17 1609 0     Pain Loc --      Pain Edu? --      Excl. in [place]? --     Constitutional: Alert and oriented. Well appearing and in no acute distress. Eyes: Conjunctivae are normal. PERRL. EOMI. Head: Atraumatic. Nose: No congestion/rhinnorhea. Mouth/Throat: Mucous membranes are moist.  Oropharynx non-erythematous. Neck: No stridor.  No meningeal signs.   Cardiovascular: Normal rate, regular rhythm. Good peripheral circulation. Grossly normal heart sounds.   Respiratory: Normal respiratory  effort.  No retractions. Lungs significantly diminished with wheezing. Gastrointestinal: Soft and nontender. No distention.  Musculoskeletal: No lower extremity tenderness nor edema. No gross deformities of extremities. Neurologic:  Normal speech and language. No gross focal neurologic deficits are appreciated.  Skin:  Skin is warm, dry and intact. No rash noted.   ____________________________________________   LABS (all labs ordered are listed, but only abnormal results are displayed)  Labs Reviewed  BRAIN NATRIURETIC PEPTIDE - Abnormal; Notable for the following components:      Result Value   B Natriuretic Peptide 196.0 (*)    All other components within normal limits  CBC WITH DIFFERENTIAL/PLATELET - Abnormal; Notable for the following components:   Neutro Abs 8.4 (*)    All other components within normal limits  COMPREHENSIVE METABOLIC PANEL - Abnormal; Notable for the following components:   Chloride 97 (*)    CO2 35 (*)    Glucose, Bld 146 (*)    All other components within normal limits  TROPONIN I   ____________________________________________  EKG   EKG Interpretation  Date/Time:  Tuesday Jul 17 2017 16:55:02 EDT Ventricular Rate:  76 PR Interval:    QRS Duration: 81 QT Interval:  377 QTC Calculation: 424 R Axis:   59 Text Interpretation:  Sinus arrhythmia Borderline short PR interval Rate slower than last time sinus rhythm new from last time 8 Confirmed by Merrily Pew [phone]) on 07/17/2017 5:21:08 PM       ____________________________________________  RADIOLOGY  Dg Chest 2 View  Result Date: 07/17/2017 CLINICAL DATA:  Progressive shortness of breath the last 2 days. Chronic tobacco use for 32 years. EXAM: CHEST - 2 VIEW COMPARISON:  One-view chest x-ray 09/22/2016. FINDINGS: The heart is mildly enlarged. Lung volumes are low. Mild lower lobe airspace disease likely reflects atelectasis. No other consolidation is present. The lungs are hyperinflated  otherwise. IMPRESSION: 1. Borderline cardiomegaly without failure. 2. Mild bibasilar airspace disease likely reflects atelectasis. Infection is considered less likely. 3. Changes of COPD. Electronically Signed   By: San Morelle M.D.   On: 07/17/2017 18:47    ____________________________________________   PROCEDURES  Procedure(s) performed:   Procedures   ____________________________________________   INITIAL IMPRESSION / ASSESSMENT AND PLAN / ED COURSE  Likely copd exacerbation. As there is acomponent of worsening when she lies flat, will check bnp/troponin/ecg as well. Doubt ACS but may be heart failure. Low suspicion for PE at this time.   Patient with significant improvement in her symptoms.  Nursing note states that her oxygen saturation was in the 70s when ambulating.  This is probably true but she was not on her home oxygen.  Patient states she feels much better than previously her cough is improved and her shortness of breath  is improved.  On my exam her lungs are more aerated than before as well.  At this time I feel like the patient is stable for discharge home.   Pertinent labs & imaging results that were available during my care of the patient were reviewed by me and considered in my medical decision making (see chart for details).  ____________________________________________  FINAL CLINICAL IMPRESSION(S) / ED DIAGNOSES  Final diagnoses:  COPD exacerbation ([place])     MEDICATIONS GIVEN DURING THIS VISIT:  Medications  ipratropium-albuterol (DUONEB) 0.5-2.5 (3) MG/3ML nebulizer solution 3 mL (3 mLs Nebulization Given by Other 07/17/17 1735)  predniSONE (DELTASONE) tablet 60 mg (60 mg Oral Given 07/17/17 1746)  magnesium sulfate IVPB 2 g 50 mL (0 g Intravenous Stopped 07/17/17 1857)  albuterol (PROVENTIL) (2.5 MG/3ML) 0.083% nebulizer solution 5 mg (5 mg Nebulization Given 07/17/17 2037)     NEW OUTPATIENT MEDICATIONS STARTED DURING THIS VISIT:  Discharge  Medication List as of 07/17/2017 10:10 PM    START taking these medications   Details  azithromycin (ZITHROMAX) 250 MG tablet Take 1 tablet (250 mg total) by mouth daily. Take first 2 tablets together, then 1 every day until finished., Starting Tue 07/17/2017, Print        Note:  This note was prepared with assistance of Dragon voice recognition software. Occasional wrong-word or sound-a-like substitutions may have occurred due to the inherent limitations of voice recognition software.   Merrily Pew, MD 07/17/17 [phone]

## 2017-07-17 NOTE — ED Notes (Signed)
Have paged respiratory  

## 2017-07-17 NOTE — ED Notes (Signed)
Pt states she is feeling much better, but upon assessing lung sounds, pt is still very diminished.

## 2017-07-17 NOTE — ED Triage Notes (Signed)
Pt on home 02 at 2L, onset Sunday increased SOB

## 2017-07-17 NOTE — ED Notes (Addendum)
Pt complaining magnesium is burning. Have slowed infusion down to 25 mL an hour

## 2017-07-17 NOTE — ED Notes (Signed)
Pt ambulatory to bathroom. Pt's O2 initially 93% during ambulation, but dropped to 77%. RN Aware and in with patient now. Pt placed on 3 liters O2 via nasal cannula. Pt states she is on 2 liters of O2 at night time and then 2 liters PRN during the day.

## 2017-07-23 DIAGNOSIS — K649 Unspecified hemorrhoids: Secondary | ICD-10-CM | POA: Diagnosis not present

## 2017-07-23 DIAGNOSIS — I4892 Unspecified atrial flutter: Secondary | ICD-10-CM | POA: Diagnosis not present

## 2017-07-23 DIAGNOSIS — I10 Essential (primary) hypertension: Secondary | ICD-10-CM | POA: Diagnosis not present

## 2017-07-23 DIAGNOSIS — J449 Chronic obstructive pulmonary disease, unspecified: Secondary | ICD-10-CM | POA: Diagnosis not present

## 2017-07-23 DIAGNOSIS — I480 Paroxysmal atrial fibrillation: Secondary | ICD-10-CM | POA: Diagnosis not present

## 2017-08-10 DIAGNOSIS — J449 Chronic obstructive pulmonary disease, unspecified: Secondary | ICD-10-CM | POA: Diagnosis not present

## 2017-08-10 DIAGNOSIS — R0902 Hypoxemia: Secondary | ICD-10-CM | POA: Diagnosis not present

## 2017-08-13 DIAGNOSIS — J9611 Chronic respiratory failure with hypoxia: Secondary | ICD-10-CM | POA: Diagnosis not present

## 2017-08-13 DIAGNOSIS — J441 Chronic obstructive pulmonary disease with (acute) exacerbation: Secondary | ICD-10-CM | POA: Diagnosis not present

## 2017-08-13 DIAGNOSIS — I4892 Unspecified atrial flutter: Secondary | ICD-10-CM | POA: Diagnosis not present

## 2017-08-13 DIAGNOSIS — I10 Essential (primary) hypertension: Secondary | ICD-10-CM | POA: Diagnosis not present

## 2017-08-31 ENCOUNTER — Emergency Department (HOSPITAL_COMMUNITY): Payer: Medicare Other

## 2017-08-31 ENCOUNTER — Inpatient Hospital Stay (HOSPITAL_COMMUNITY)
Admission: EM | Admit: 2017-08-31 | Discharge: 2017-09-04 | DRG: 189 | Disposition: A | Discharge status: Home / Self Care | Payer: Medicare Other | Attending: Internal Medicine | Admitting: Internal Medicine

## 2017-08-31 ENCOUNTER — Other Ambulatory Visit: Payer: Self-pay

## 2017-08-31 ENCOUNTER — Encounter (HOSPITAL_COMMUNITY): Payer: Self-pay | Admitting: *Deleted

## 2017-08-31 DIAGNOSIS — E785 Hyperlipidemia, unspecified: Secondary | ICD-10-CM | POA: Diagnosis present

## 2017-08-31 DIAGNOSIS — Z7952 Long term (current) use of systemic steroids: Secondary | ICD-10-CM | POA: Diagnosis not present

## 2017-08-31 DIAGNOSIS — E669 Obesity, unspecified: Secondary | ICD-10-CM | POA: Diagnosis present

## 2017-08-31 DIAGNOSIS — I10 Essential (primary) hypertension: Secondary | ICD-10-CM | POA: Diagnosis present

## 2017-08-31 DIAGNOSIS — I4891 Unspecified atrial fibrillation: Secondary | ICD-10-CM | POA: Diagnosis present

## 2017-08-31 DIAGNOSIS — J189 Pneumonia, unspecified organism: Secondary | ICD-10-CM | POA: Diagnosis not present

## 2017-08-31 DIAGNOSIS — Z853 Personal history of malignant neoplasm of breast: Secondary | ICD-10-CM | POA: Diagnosis not present

## 2017-08-31 DIAGNOSIS — J9621 Acute and chronic respiratory failure with hypoxia: Principal | ICD-10-CM | POA: Diagnosis present

## 2017-08-31 DIAGNOSIS — R0602 Shortness of breath: Secondary | ICD-10-CM

## 2017-08-31 DIAGNOSIS — I4892 Unspecified atrial flutter: Secondary | ICD-10-CM | POA: Diagnosis not present

## 2017-08-31 DIAGNOSIS — J9601 Acute respiratory failure with hypoxia: Secondary | ICD-10-CM

## 2017-08-31 DIAGNOSIS — J441 Chronic obstructive pulmonary disease with (acute) exacerbation: Secondary | ICD-10-CM | POA: Diagnosis not present

## 2017-08-31 DIAGNOSIS — Z7901 Long term (current) use of anticoagulants: Secondary | ICD-10-CM | POA: Diagnosis not present

## 2017-08-31 DIAGNOSIS — E875 Hyperkalemia: Secondary | ICD-10-CM | POA: Diagnosis not present

## 2017-08-31 DIAGNOSIS — Z9981 Dependence on supplemental oxygen: Secondary | ICD-10-CM

## 2017-08-31 DIAGNOSIS — T380X5A Adverse effect of glucocorticoids and synthetic analogues, initial encounter: Secondary | ICD-10-CM | POA: Diagnosis not present

## 2017-08-31 DIAGNOSIS — E1165 Type 2 diabetes mellitus with hyperglycemia: Secondary | ICD-10-CM | POA: Diagnosis not present

## 2017-08-31 DIAGNOSIS — Z87891 Personal history of nicotine dependence: Secondary | ICD-10-CM | POA: Diagnosis not present

## 2017-08-31 DIAGNOSIS — E119 Type 2 diabetes mellitus without complications: Secondary | ICD-10-CM

## 2017-08-31 DIAGNOSIS — J439 Emphysema, unspecified: Secondary | ICD-10-CM | POA: Diagnosis not present

## 2017-08-31 LAB — BASIC METABOLIC PANEL
ANION GAP: 8 (ref 5–15)
BUN: 13 mg/dL (ref 6–20)
CO2: 34 mmol/L — ABNORMAL HIGH (ref 22–32)
Calcium: 9.6 mg/dL (ref 8.9–10.3)
Chloride: 98 mmol/L — ABNORMAL LOW (ref 101–111)
Creatinine, Ser: 0.72 mg/dL (ref 0.44–1.00)
Glucose, Bld: 211 mg/dL — ABNORMAL HIGH (ref 65–99)
POTASSIUM: 4.8 mmol/L (ref 3.5–5.1)
Sodium: 140 mmol/L (ref 135–145)

## 2017-08-31 LAB — TROPONIN I: Troponin I: <0.03 ng/mL (ref <0.03)

## 2017-08-31 LAB — CBC
HEMATOCRIT: 44.6 % (ref 36.0–46.0)
HEMOGLOBIN: 13.9 g/dL (ref 12.0–15.0)
MCH: 30.7 pg (ref 26.0–34.0)
MCHC: 31.2 g/dL (ref 30.0–36.0)
MCV: 98.5 fL (ref 78.0–100.0)
Platelets: 263 10*3/uL (ref 150–400)
RBC: 4.53 MIL/uL (ref 3.87–5.11)
RDW: 13.6 % (ref 11.5–15.5)
WBC: 12.8 10*3/uL — AB (ref 4.0–10.5)

## 2017-08-31 MED ORDER — SODIUM CHLORIDE 0.9% FLUSH
3.0000 mL | Freq: Two times a day (BID) | INTRAVENOUS | Status: DC
  Administered 2017-09-01 – 2017-09-04 (×5): 3 mL via INTRAVENOUS

## 2017-08-31 MED ORDER — DOXYCYCLINE HYCLATE 100 MG PO TABS
100.0000 mg | ORAL_TABLET | Freq: Two times a day (BID) | ORAL | Status: DC
  Administered 2017-09-01 (×2): 100 mg via ORAL
  Filled 2017-08-31 (×2): qty 1

## 2017-08-31 MED ORDER — INSULIN ASPART 100 UNIT/ML ~~LOC~~ SOLN
0.0000 [IU] | Freq: Three times a day (TID) | SUBCUTANEOUS | Status: DC
  Administered 2017-09-01: 3 [IU] via SUBCUTANEOUS

## 2017-08-31 MED ORDER — ACETAMINOPHEN 325 MG PO TABS
650.0000 mg | ORAL_TABLET | Freq: Four times a day (QID) | ORAL | Status: DC | PRN
  Administered 2017-09-02 – 2017-09-03 (×3): 650 mg via ORAL
  Filled 2017-08-31 (×3): qty 2

## 2017-08-31 MED ORDER — LEVALBUTEROL HCL 0.63 MG/3ML IN NEBU
0.6300 mg | INHALATION_SOLUTION | Freq: Four times a day (QID) | RESPIRATORY_TRACT | Status: DC
  Administered 2017-09-01 (×2): 0.63 mg via RESPIRATORY_TRACT
  Filled 2017-08-31 (×2): qty 3

## 2017-08-31 MED ORDER — AZITHROMYCIN 250 MG PO TABS
500.0000 mg | ORAL_TABLET | Freq: Once | ORAL | Status: AC
  Administered 2017-08-31: 500 mg via ORAL
  Filled 2017-08-31: qty 2

## 2017-08-31 MED ORDER — ACETAMINOPHEN 500 MG PO TABS
500.0000 mg | ORAL_TABLET | ORAL | Status: DC
  Administered 2017-09-01 – 2017-09-03 (×2): 500 mg via ORAL
  Filled 2017-08-31 (×2): qty 1

## 2017-08-31 MED ORDER — DILTIAZEM HCL ER COATED BEADS 240 MG PO CP24
240.0000 mg | ORAL_CAPSULE | Freq: Every day | ORAL | Status: DC
  Administered 2017-09-01 – 2017-09-04 (×4): 240 mg via ORAL
  Filled 2017-08-31 (×4): qty 1

## 2017-08-31 MED ORDER — RIVAROXABAN 10 MG PO TABS
10.0000 mg | ORAL_TABLET | Freq: Every day | ORAL | Status: DC
  Administered 2017-09-01 – 2017-09-03 (×3): 10 mg via ORAL
  Filled 2017-08-31 (×4): qty 1

## 2017-08-31 MED ORDER — METHYLPREDNISOLONE SODIUM SUCC 40 MG IJ SOLR
40.0000 mg | Freq: Two times a day (BID) | INTRAMUSCULAR | Status: DC
  Administered 2017-09-01: 40 mg via INTRAVENOUS
  Filled 2017-08-31: qty 1

## 2017-08-31 MED ORDER — TELMISARTAN-HCTZ 80-12.5 MG PO TABS: 0.5000 | ORAL_TABLET | ORAL | Status: DC | PRN

## 2017-08-31 MED ORDER — PREDNISONE 10 MG PO TABS
60.0000 mg | ORAL_TABLET | Freq: Once | ORAL | Status: AC
  Administered 2017-08-31: 60 mg via ORAL
  Filled 2017-08-31: qty 1

## 2017-08-31 MED ORDER — VITAMIN C 500 MG PO TABS
ORAL_TABLET | Freq: Every day | ORAL | Status: DC
  Administered 2017-09-01: 500 mg via ORAL
  Filled 2017-08-31 (×3): qty 1

## 2017-08-31 MED ORDER — IPRATROPIUM BROMIDE 0.02 % IN SOLN
RESPIRATORY_TRACT | Status: AC
  Administered 2017-08-31: 20:00:00
  Filled 2017-08-31: qty 5

## 2017-08-31 MED ORDER — ALBUTEROL (5 MG/ML) CONTINUOUS INHALATION SOLN
INHALATION_SOLUTION | RESPIRATORY_TRACT | Status: AC
  Administered 2017-08-31: 20:00:00
  Filled 2017-08-31: qty 20

## 2017-08-31 MED ORDER — SODIUM CHLORIDE 0.9% FLUSH: 3.0000 mL | INTRAVENOUS | Status: DC | PRN

## 2017-08-31 MED ORDER — VITAMIN D (ERGOCALCIFEROL) 1.25 MG (50000 UNIT) PO CAPS: 50000.0000 [IU] | ORAL_CAPSULE | ORAL | Status: DC

## 2017-08-31 MED ORDER — INSULIN ASPART 100 UNIT/ML ~~LOC~~ SOLN
0.0000 [IU] | Freq: Every day | SUBCUTANEOUS | Status: DC
  Administered 2017-09-01: 2 [IU] via SUBCUTANEOUS

## 2017-08-31 MED ORDER — ONDANSETRON HCL 4 MG/2ML IJ SOLN: 4.0000 mg | Freq: Four times a day (QID) | INTRAMUSCULAR | Status: DC | PRN

## 2017-08-31 MED ORDER — METOPROLOL TARTRATE 50 MG PO TABS
50.0000 mg | ORAL_TABLET | Freq: Two times a day (BID) | ORAL | Status: DC
  Administered 2017-09-01 – 2017-09-04 (×8): 50 mg via ORAL
  Filled 2017-08-31 (×8): qty 1

## 2017-08-31 MED ORDER — ROSUVASTATIN CALCIUM 10 MG PO TABS
10.0000 mg | ORAL_TABLET | Freq: Every evening | ORAL | Status: DC
  Administered 2017-09-01: 10 mg via ORAL
  Filled 2017-08-31 (×3): qty 1

## 2017-08-31 MED ORDER — FUROSEMIDE 40 MG PO TABS: 40.0000 mg | ORAL_TABLET | Freq: Every day | ORAL | Status: DC | PRN

## 2017-08-31 MED ORDER — GARLIC 400 MG PO TBEC: DELAYED_RELEASE_TABLET | Freq: Every day | ORAL | Status: DC

## 2017-08-31 MED ORDER — VITAMIN C 500 MG PO TABS
250.0000 mg | ORAL_TABLET | Freq: Every day | ORAL | Status: DC
  Administered 2017-09-02 – 2017-09-04 (×3): 250 mg via ORAL
  Filled 2017-08-31 (×6): qty 1

## 2017-08-31 MED ORDER — ACETAMINOPHEN 325 MG PO TABS
650.0000 mg | ORAL_TABLET | Freq: Once | ORAL | Status: AC
  Administered 2017-08-31: 650 mg via ORAL
  Filled 2017-08-31: qty 2

## 2017-08-31 MED ORDER — IPRATROPIUM BROMIDE 0.02 % IN SOLN
0.5000 mg | Freq: Four times a day (QID) | RESPIRATORY_TRACT | Status: DC
  Administered 2017-09-01 – 2017-09-04 (×12): 0.5 mg via RESPIRATORY_TRACT
  Filled 2017-08-31 (×14): qty 2.5

## 2017-08-31 MED ORDER — ONDANSETRON HCL 4 MG PO TABS: 4.0000 mg | ORAL_TABLET | Freq: Four times a day (QID) | ORAL | Status: DC | PRN

## 2017-08-31 MED ORDER — SODIUM CHLORIDE 0.9 % IV SOLN: 250.0000 mL | INTRAVENOUS | Status: DC | PRN

## 2017-08-31 MED ORDER — ACETAMINOPHEN 650 MG RE SUPP: 650.0000 mg | Freq: Four times a day (QID) | RECTAL | Status: DC | PRN

## 2017-08-31 MED ORDER — ALBUTEROL SULFATE (2.5 MG/3ML) 0.083% IN NEBU: 5.0000 mg | INHALATION_SOLUTION | Freq: Once | RESPIRATORY_TRACT | Status: DC

## 2017-08-31 NOTE — ED Triage Notes (Signed)
Pt is here for 2-3 days of increasing shortness of breath. Pt is on home O2 and has COPD. Pt denies any fever or chills with this.  Pt is able to speak in full sentences but does appear short of breath.  Pt is on 1.5-2L [place] at home.  Pt arrives on 1.5L [place] and her spo2 is 65% in triage.  Placed pt on 3L [place] at this time as she states that her PCP told her to bump it up to 2.5L when she is sob.  Pt spo2 increased to 81% with the 3L .  No CP with this.

## 2017-08-31 NOTE — ED Notes (Signed)
Pt ambulate to restroom

## 2017-08-31 NOTE — ED Notes (Signed)
RT called to come see pt.

## 2017-08-31 NOTE — H&P (Addendum)
History and Physical    Eileen A [place] DDU:[id number] DOB: 11-10-1945 DOA: 08/31/2017  PCP: Lavella Lemons, PA   Patient coming from: Home  Chief Complaint: Dyspnea  HPI: Eileen [name] is a 72 y.o. female with medical history significant for hypertension, atrial fibrillation, obesity, dyslipidemia, atrial fibrillation on Xarelto, and COPD with chronic hypoxemia with home 2 to 2.5 L nasal cannula, who presented to the emergency department with worsening shortness of breath that has been ongoing for the last 4 to 5 days.  She has been trying to use her home nebulizer machine with little to no relief and some feelings of anxiety and jitteriness.  She denies any cough, fever, chills, chest discomfort, or lower extremity edema.  She denies any particular aggravating or alleviating factors.   ED Course: Vital signs are stable and laboratory data demonstrates WBC count of 12.8 and glucose of 211.  Two-view chest x-ray with signs of emphysema and some possible early pneumonia versus bronchiectasis in the upper lung bases.  EKG with sinus arrhythmia at 79 bpm.  She has been started on some oral prednisone as well as azithromycin and breathing treatments.  She does not have any significant respiratory distress at the moment.  She was noted to have some hypoxemia with O2 saturations in the high 80th percentile with her usual home oxygen level for which she has required now 4 L of oxygen for improvement.  Review of Systems: All others reviewed and otherwise negative.  Past Medical History:  Diagnosis Date  . Asthma   . COPD (chronic obstructive pulmonary disease) ([place])   . History of breast cancer    right breast  . Hypertension     Past Surgical History:  Procedure Laterality Date  . CESAREAN SECTION    . HERNIA REPAIR     RIH  . MASTECTOMY  2011   right breast     reports that she quit smoking about 23 years ago. Her smoking use included cigarettes. She started smoking about 54 years  ago. She has a 16.00 pack-year smoking history. She has never used smokeless tobacco. She reports that she does not drink alcohol or use drugs.  Allergies  Allergen Reactions  . Codeine Other (See Comments)    "jittery"    Family History  Problem Relation Age of Onset  . Cancer Mother        lung  . Diabetes Brother     Prior to Admission medications   Medication Sig Start Date End Date Taking? Authorizing Provider  acetaminophen (TYLENOL) 500 MG tablet Take 500 mg by mouth every other day.    [provider]  albuterol (PROVENTIL) (2.5 MG/3ML) 0.083% nebulizer solution Take 2.5 mg by nebulization every 6 (six) hours as needed for wheezing or shortness of breath.    [provider]  Ascorbic Acid (VITAMIN C PO) Take 1 tablet by mouth daily.    [provider]  azithromycin (ZITHROMAX) 250 MG tablet Take 1 tablet (250 mg total) by mouth daily. Take first 2 tablets together, then 1 every day until finished. 07/17/17   Mesner, Corene Cornea, MD  blood glucose meter kit and supplies KIT Dispense based on patient and insurance preference. Use up to four times daily as directed. (FOR ICD-9 250.00, 250.01).  Check sugar before meals and at night, keep a log. 09/29/16   Arrien, Jimmy Picket, MD  diltiazem (CARDIZEM CD) 240 MG 24 hr capsule TAKE ONE CAPSULE BY MOUTH DAILY. Patient taking differently: TAKE ONE  CAPSULE BY MOUTH DAILY AS NEEDED FOR HIGH BLOOD PRESSURE LEVELS 04/03/17   Herminio Commons, MD  furosemide (LASIX) 40 MG tablet Take 1 tablet by mouth daily as needed for fluid or edema.  04/15/15   [provider]  Garlic (GARLIQUE PO) Take 1 tablet by mouth daily.     [provider]  metoprolol tartrate (LOPRESSOR) 50 MG tablet Take 1 tablet (50 mg total) by mouth 2 (two) times daily. 09/29/16   Arrien, Jimmy Picket, MD  Multiple Vitamins-Minerals (EMERGEN-C VITAMIN C PO) Take 1 Package by mouth daily.    [provider]  OXYGEN Inhale  2.5-3 L into the lungs daily. At night and daily as needed    [provider]  predniSONE (DELTASONE) 20 MG tablet 2 tabs po daily x 4 days 07/17/17   Mesner, Corene Cornea, MD  PROAIR HFA 108 (90 BASE) MCG/ACT inhaler Inhale 2 puffs into the lungs every 6 (six) hours as needed for wheezing or shortness of breath.  03/06/11   [provider]  rosuvastatin (CRESTOR) 10 MG tablet Take 10 mg by mouth every evening. 01/08/17   [provider]  telmisartan-hydrochlorothiazide (MICARDIS HCT) 80-12.5 MG tablet Take 0.5 tablets by mouth as needed (for blood pressure).  03/18/15   [provider]  Vitamin D, Ergocalciferol, (DRISDOL) 50000 units CAPS capsule Take 50,000 Units every 30 (thirty) days by mouth.    [provider]  XARELTO 20 MG TABS tablet TAKE ONE TABLET BY MOUTH DAILY WITH SUPPER. 05/02/17   Herminio Commons, MD    Physical Exam: Vitals:   08/31/17 2230 08/31/17 2245 08/31/17 2300 08/31/17 2310  BP: 112/70  120/69   Pulse: 91 88 88 87  Resp: (!) 22 (!) 22 (!) 22 (!) 40  Temp:      TempSrc:      SpO2: 91% 94% (!) 87% 92%  Weight:        Constitutional: NAD, calm, comfortable Vitals:   08/31/17 2230 08/31/17 2245 08/31/17 2300 08/31/17 2310  BP: 112/70  120/69   Pulse: 91 88 88 87  Resp: (!) 22 (!) 22 (!) 22 (!) 40  Temp:      TempSrc:      SpO2: 91% 94% (!) 87% 92%  Weight:       Eyes: lids and conjunctivae normal ENMT: Mucous membranes are moist.  Neck: normal, supple Respiratory: clear to auscultation bilaterally. Normal respiratory effort. No accessory muscle use.  On 4 L nasal cannula. Cardiovascular: Regular rate and rhythm, no murmurs. No extremity edema. Abdomen: no tenderness, no distention. Bowel sounds positive.  Musculoskeletal:  No joint deformity upper and lower extremities.   Skin: no rashes, lesions, ulcers.  Psychiatric: Normal judgment and insight. Alert and oriented x 3. Normal mood.   Labs on Admission: I have  personally reviewed following labs and imaging studies  CBC: Recent Labs  Lab 08/31/17 1944  WBC 12.8*  HGB 13.9  HCT 44.6  MCV 98.5  PLT 220   Basic Metabolic Panel: Recent Labs  Lab 08/31/17 1944  NA 140  K 4.8  CL 98*  CO2 34*  GLUCOSE 211*  BUN 13  CREATININE 0.72  CALCIUM 9.6   GFR: Estimated Creatinine Clearance: 62.4 mL/min (by C-G formula based on SCr of 0.72 mg/dL). Liver Function Tests: No results for input(s): AST, ALT, ALKPHOS, BILITOT, PROT, ALBUMIN in the last 168 hours. No results for input(s): LIPASE, AMYLASE in the last 168 hours. No results for input(s):  AMMONIA in the last 168 hours. Coagulation Profile: No results for input(s): INR, PROTIME in the last 168 hours. Cardiac Enzymes: Recent Labs  Lab 08/31/17 1944  TROPONINI <0.03   BNP (last 3 results) No results for input(s): PROBNP in the last 8760 hours. HbA1C: No results for input(s): HGBA1C in the last 72 hours. CBG: No results for input(s): GLUCAP in the last 168 hours. Lipid Profile: No results for input(s): CHOL, HDL, LDLCALC, TRIG, CHOLHDL, LDLDIRECT in the last 72 hours. Thyroid Function Tests: No results for input(s): TSH, T4TOTAL, FREET4, T3FREE, THYROIDAB in the last 72 hours. Anemia Panel: No results for input(s): VITAMINB12, FOLATE, FERRITIN, TIBC, IRON, RETICCTPCT in the last 72 hours. Urine analysis:    Component Value Date/Time   COLORURINE YELLOW 09/09/2009 1010   APPEARANCEUR HAZY (A) 09/09/2009 1010   LABSPEC 1.030 09/09/2009 1010   PHURINE 5.5 09/09/2009 1010   GLUCOSEU NEGATIVE 09/09/2009 1010   HGBUR NEGATIVE 09/09/2009 1010   BILIRUBINUR NEGATIVE 09/09/2009 1010   KETONESUR NEGATIVE 09/09/2009 1010   PROTEINUR NEGATIVE 09/09/2009 1010   UROBILINOGEN 0.2 09/09/2009 1010   NITRITE NEGATIVE 09/09/2009 1010   LEUKOCYTESUR  09/09/2009 1010    NEGATIVE MICROSCOPIC NOT DONE ON URINES WITH NEGATIVE PROTEIN, BLOOD, LEUKOCYTES, NITRITE, OR GLUCOSE <1000 mg/dL.     Radiological Exams on Admission: Dg Chest 2 View  Result Date: 08/31/2017 CLINICAL DATA:  72 year old female with increasing shortness of breath for 2-3 days. COPD on home oxygen. EXAM: CHEST - 2 VIEW COMPARISON:  07/17/2017 and earlier. FINDINGS: Stable large lung volumes. Stable mild cardiomegaly and mediastinal contours. No pneumothorax, pulmonary edema or pleural effusion. Increased streaky opacity at both lung bases. No consolidation. No acute osseous abnormality identified. Negative visible bowel gas pattern. IMPRESSION: Chronic lung disease with Emphysema (ICD10-J43.9). Increased streaky opacity at both lung bases suspicious for acute infectious exacerbation. No pleural effusion. Electronically Signed   By: Genevie Ann M.D.   On: 08/31/2017 21:56    EKG: Independently reviewed. Sinus arrythmia 79bpm.  Assessment/Plan Principal Problem:   Acute hypoxemic respiratory failure (HCC) Active Problems:   COPD exacerbation (H[facility])   Obesity   Essential hypertension   Atrial flutter ([place])   Diabetes mellitus ([place])    1. Acute on chronic hypoxemic respiratory failure secondary to COPD exacerbation.  Continue ongoing treatment with IV steroid, DuoNeb's with Xopenex, and oral doxycycline for bronchiectasis.  Wean oxygen as tolerated. 2. Hyperglycemia with history of type 2 diabetes.  Patient does not appear to be on home medications for this but has been noted to have A1c levels greater than 7%.  Initiate sliding scale insulin for coverage while on steroids. Recheck A1c. 3. Hypertension.  Continue home Cardizem and Micardis HCT. Continue Lasix. 4. Atrial fibrillation.  Currently rate controlled.  Maintain on Cardizem and metoprolol and monitor on telemetry.  Xarelto for anticoagulation.   DVT prophylaxis: Xarelto Code Status: Full Family Communication: None at bedside Disposition Plan:AECOPD treatment Consults called:None Admission status: Inpatient, tele   Rodena Goldmann DO [facility] Pager [phone]  If 7PM-7AM, please contact night-coverage [email or web address] Password [facility]  08/31/2017, 11:17 PM

## 2017-08-31 NOTE — ED Provider Notes (Signed)
Emergency Department Provider Note   I have reviewed the triage vital signs and the nursing notes.   HISTORY  Chief Complaint Shortness of Breath   HPI Eileen [name] is a 72 y.o. female with history of COPD, breast cancer, hypertension and multiple medical problems documented below the presents to the emergency department today secondary to dyspnea.  Patient states that she had a COPD exacerbation few weeks ago and had seen Dr. Luan Pulling and started increase steroids and antibiotics had been improving from that standpoint.  But over the last week she had progressively worsening dyspnea and a dry nonproductive cough.  No fevers.  She said increase her oxygen a little bit at home to help with the dyspnea but no other issues. No leg swelling. No chest pain or leg pain.  No other associated or modifying symptoms.    Past Medical History:  Diagnosis Date  . Asthma   . COPD (chronic obstructive pulmonary disease) ([place])   . History of breast cancer    right breast  . Hypertension     Patient Active Problem List   Diagnosis Date Noted  . Diabetes mellitus ([place]) 08/31/2017  . Acute hypoxemic respiratory failure ([place]) 08/31/2017  . Atrial fibrillation with RVR ([place]) 09/24/2016  . Atrial flutter ([place]) 09/23/2016  . Acute on chronic respiratory failure ([place]) 03/24/2015  . Metabolic encephalopathy 36/64/4034  . HCAP (healthcare-associated pneumonia) 03/24/2015  . Essential hypertension   . Anticoagulation management encounter   . New onset atrial flutter ([place]) 03/21/2015  . Obesity 03/21/2015  . Atrial flutter with rapid ventricular response ([place]) 03/21/2015  . COPD (chronic obstructive pulmonary disease) ([place]) 12/20/2014  . COPD exacerbation ([place]) 12/20/2014  . DCIS (ductal carcinoma in situ) of breast, right, 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, Arimadex 03/10/2011    Past Surgical History:  Procedure Laterality Date  . CESAREAN SECTION    . HERNIA REPAIR     RIH  . MASTECTOMY   2011   right breast      Allergies Codeine  Family History  Problem Relation Age of Onset  . Cancer Mother        lung  . Diabetes Brother     Social History Social History   Tobacco Use  . Smoking status: Former Smoker    Packs/day: 0.50    Years: 32.00    Pack years: 16.00    Types: Cigarettes    Start date: 12/12/1962    Last attempt to quit: 03/13/1994    Years since quitting: 23.4  . Smokeless tobacco: Never Used  Substance Use Topics  . Alcohol use: No    Alcohol/week: 0.0 oz  . Drug use: No    Review of Systems  All other systems negative except as documented in the HPI. All pertinent positives and negatives as reviewed in the HPI. ____________________________________________   PHYSICAL EXAM:  VITAL SIGNS: ED Triage Vitals  Enc Vitals Group     BP 08/31/17 1905 (!) 149/79     Pulse Rate 08/31/17 1905 100     Resp 08/31/17 1905 (!) 22     Temp 08/31/17 1905 98.5 F (36.9 C)     Temp Source 08/31/17 1905 Oral     SpO2 08/31/17 1905 (!) 66 %     Weight 08/31/17 1908 177 lb (80.3 kg)    Constitutional: Alert and oriented. Well appearing and in no acute distress. Eyes: Conjunctivae are normal. PERRL. EOMI. Head: Atraumatic. Nose: No congestion/rhinnorhea. Mouth/Throat: Mucous membranes are moist.  Oropharynx non-erythematous. Neck: No stridor.  No meningeal signs.   Cardiovascular: Normal rate, regular rhythm. Good peripheral circulation. Grossly normal heart sounds.   Respiratory: tachypneic respiratory effort.  No retractions. Lungs significantly diminished.. Gastrointestinal: Soft and nontender. No distention.  Musculoskeletal: No lower extremity tenderness nor edema. No gross deformities of extremities. Neurologic:  Normal speech and language. No gross focal neurologic deficits are appreciated.  Skin:  Skin is warm, dry and intact. No rash noted.   ____________________________________________   LABS (all labs ordered are listed, but only  abnormal results are displayed)  Labs Reviewed  BASIC METABOLIC PANEL - Abnormal; Notable for the following components:      Result Value   Chloride 98 (*)    CO2 34 (*)    Glucose, Bld 211 (*)    All other components within normal limits  CBC - Abnormal; Notable for the following components:   WBC 12.8 (*)    All other components within normal limits  GLUCOSE, CAPILLARY - Abnormal; Notable for the following components:   Glucose-Capillary 213 (*)    All other components within normal limits  TROPONIN I  BASIC METABOLIC PANEL  CBC  HEMOGLOBIN A1C   ____________________________________________  EKG   EKG Interpretation  Date/Time:  Friday August 31 2017 19:13:46 EDT Ventricular Rate:  90 PR Interval:  120 QRS Duration: 84 QT Interval:  346 QTC Calculation: 423 R Axis:   20 Text Interpretation:  Normal sinus rhythm Normal ECG No significant change since last tracing Confirmed by Merrily Pew [phone]) on 08/31/2017 7:39:53 PM       ____________________________________________  RADIOLOGY  Dg Chest 2 View  Result Date: 08/31/2017 CLINICAL DATA:  73 year old female with increasing shortness of breath for 2-3 days. COPD on home oxygen. EXAM: CHEST - 2 VIEW COMPARISON:  07/17/2017 and earlier. FINDINGS: Stable large lung volumes. Stable mild cardiomegaly and mediastinal contours. No pneumothorax, pulmonary edema or pleural effusion. Increased streaky opacity at both lung bases. No consolidation. No acute osseous abnormality identified. Negative visible bowel gas pattern. IMPRESSION: Chronic lung disease with Emphysema (ICD10-J43.9). Increased streaky opacity at both lung bases suspicious for acute infectious exacerbation. No pleural effusion. Electronically Signed   By: Genevie Ann M.D.   On: 08/31/2017 21:56    ____________________________________________   PROCEDURES  Procedure(s) performed:   Procedures  CRITICAL CARE Performed by: Merrily Pew Total critical care time:  358 minutes Critical care time was exclusive of separately billable procedures and treating other patients. Critical care was necessary to treat or prevent imminent or life-threatening deterioration. Critical care was time spent personally by me on the following activities: development of treatment plan with patient and/or surrogate as well as nursing, discussions with consultants, evaluation of patient's response to treatment, examination of patient, obtaining history from patient or surrogate, ordering and performing treatments and interventions, ordering and review of laboratory studies, ordering and review of radiographic studies, pulse oximetry and re-evaluation of patient's condition.  ____________________________________________   INITIAL IMPRESSION / ASSESSMENT AND PLAN / ED COURSE  Pretty significantly diminished breath sounds will start with continuous albuterol, steroids, magnesium and antibiotics.  Will reassess but suspect she will likely need to be admitted just based on the initial severity of her bronchoconstriction.  On couple reassessments patient with persistent wheezing and diminished breath sounds.  Still requiring 4 L instead of her normal 2 L of oxygen. Will admit for further management.      Pertinent labs & imaging results that were available during my care  of the patient were reviewed by me and considered in my medical decision making (see chart for details).  ____________________________________________  FINAL CLINICAL IMPRESSION(S) / ED DIAGNOSES  Final diagnoses:  COPD exacerbation ([place])  Shortness of breath  Community acquired pneumonia, unspecified laterality  Acute on chronic respiratory failure with hypoxia ([place])     MEDICATIONS GIVEN DURING THIS VISIT:  Medications  acetaminophen (TYLENOL) tablet 500 mg (has no administration in time range)  vitamin C (ASCORBIC ACID) tablet 250 mg (has no administration in time range)  diltiazem (CARDIZEM CD) 24  hr capsule 240 mg (has no administration in time range)  furosemide (LASIX) tablet 40 mg (has no administration in time range)  Garlic TBEC (has no administration in time range)  metoprolol tartrate (LOPRESSOR) tablet 50 mg (has no administration in time range)  EMERGEN-C VITAMIN C PACK (has no administration in time range)  rosuvastatin (CRESTOR) tablet 10 mg (has no administration in time range)  telmisartan-hydrochlorothiazide (MICARDIS HCT) 80-12.5 MG per tablet 0.5 tablet (has no administration in time range)  Vitamin D (Ergocalciferol) (DRISDOL) capsule 50,000 Units (has no administration in time range)  rivaroxaban (XARELTO) tablet 10 mg (has no administration in time range)  sodium chloride flush (NS) 0.9 % injection 3 mL (3 mLs Intravenous Given 09/01/17 0117)  sodium chloride flush (NS) 0.9 % injection 3 mL (has no administration in time range)  0.9 %  sodium chloride infusion (has no administration in time range)  acetaminophen (TYLENOL) tablet 650 mg (has no administration in time range)    Or  acetaminophen (TYLENOL) suppository 650 mg (has no administration in time range)  ondansetron (ZOFRAN) tablet 4 mg (has no administration in time range)    Or  ondansetron (ZOFRAN) injection 4 mg (has no administration in time range)  insulin aspart (novoLOG) injection 0-20 Units (has no administration in time range)  insulin aspart (novoLOG) injection 0-5 Units (has no administration in time range)  doxycycline (VIBRA-TABS) tablet 100 mg (has no administration in time range)  methylPREDNISolone sodium succinate (SOLU-MEDROL) 40 mg/mL injection 40 mg (has no administration in time range)  levalbuterol (XOPENEX) nebulizer solution 0.63 mg (has no administration in time range)  ipratropium (ATROVENT) nebulizer solution 0.5 mg (has no administration in time range)  ipratropium (ATROVENT) 0.02 % nebulizer solution (  Given 08/31/17 1948)  albuterol (PROVENTIL, VENTOLIN) (5 MG/ML) 0.5% continuous  inhalation solution (  Given 08/31/17 1948)  predniSONE (DELTASONE) tablet 60 mg (60 mg Oral Given 08/31/17 1957)  azithromycin (ZITHROMAX) tablet 500 mg (500 mg Oral Given 08/31/17 1957)  acetaminophen (TYLENOL) tablet 650 mg (650 mg Oral Given 08/31/17 2313)     NEW OUTPATIENT MEDICATIONS STARTED DURING THIS VISIT:  Current Discharge Medication List      Note:  This note was prepared with assistance of Dragon voice recognition software. Occasional wrong-word or sound-a-like substitutions may have occurred due to the inherent limitations of voice recognition software.   Merrily Pew, MD 09/01/17 [phone]

## 2017-09-01 ENCOUNTER — Other Ambulatory Visit: Payer: Self-pay

## 2017-09-01 ENCOUNTER — Encounter (HOSPITAL_COMMUNITY): Payer: Self-pay

## 2017-09-01 DIAGNOSIS — J441 Chronic obstructive pulmonary disease with (acute) exacerbation: Secondary | ICD-10-CM

## 2017-09-01 DIAGNOSIS — I10 Essential (primary) hypertension: Secondary | ICD-10-CM

## 2017-09-01 DIAGNOSIS — J9621 Acute and chronic respiratory failure with hypoxia: Principal | ICD-10-CM

## 2017-09-01 DIAGNOSIS — I4892 Unspecified atrial flutter: Secondary | ICD-10-CM

## 2017-09-01 LAB — BASIC METABOLIC PANEL
Anion gap: 10 (ref 5–15)
BUN: 12 mg/dL (ref 6–20)
CHLORIDE: 98 mmol/L — AB (ref 101–111)
CO2: 35 mmol/L — ABNORMAL HIGH (ref 22–32)
Calcium: 9.5 mg/dL (ref 8.9–10.3)
Creatinine, Ser: 0.64 mg/dL (ref 0.44–1.00)
GFR calc non Af Amer: >60 mL/min (ref >60)
Glucose, Bld: 141 mg/dL — ABNORMAL HIGH (ref 65–99)
POTASSIUM: 5.2 mmol/L — AB (ref 3.5–5.1)
SODIUM: 143 mmol/L (ref 135–145)

## 2017-09-01 LAB — CBC
HEMATOCRIT: 42.6 % (ref 36.0–46.0)
Hemoglobin: 12.8 g/dL (ref 12.0–15.0)
MCH: 29.7 pg (ref 26.0–34.0)
MCHC: 30 g/dL (ref 30.0–36.0)
MCV: 98.8 fL (ref 78.0–100.0)
Platelets: 255 10*3/uL (ref 150–400)
RBC: 4.31 MIL/uL (ref 3.87–5.11)
RDW: 13.6 % (ref 11.5–15.5)
WBC: 12.2 10*3/uL — AB (ref 4.0–10.5)

## 2017-09-01 LAB — GLUCOSE, CAPILLARY
GLUCOSE-CAPILLARY: 137 mg/dL — AB (ref 65–99)
GLUCOSE-CAPILLARY: 213 mg/dL — AB (ref 65–99)
GLUCOSE-CAPILLARY: 289 mg/dL — AB (ref 65–99)
GLUCOSE-CAPILLARY: 88 mg/dL (ref 65–99)
Glucose-Capillary: 116 mg/dL — ABNORMAL HIGH (ref 65–99)

## 2017-09-01 LAB — HEMOGLOBIN A1C
Hgb A1c MFr Bld: 6.9 % — ABNORMAL HIGH (ref 4.8–5.6)
Mean Plasma Glucose: 151.33 mg/dL

## 2017-09-01 MED ORDER — SODIUM CHLORIDE 0.9 % IV SOLN
INTRAVENOUS | Status: AC
  Administered 2017-09-01: 11:00:00 via INTRAVENOUS

## 2017-09-01 MED ORDER — ZOLPIDEM TARTRATE 5 MG PO TABS
5.0000 mg | ORAL_TABLET | Freq: Every evening | ORAL | Status: DC | PRN
  Administered 2017-09-01: 5 mg via ORAL
  Filled 2017-09-01: qty 1

## 2017-09-01 MED ORDER — HYDROCHLOROTHIAZIDE 10 MG/ML ORAL SUSPENSION
6.2500 mg | Freq: Every day | ORAL | Status: DC | PRN
  Filled 2017-09-01: qty 1.25

## 2017-09-01 MED ORDER — INSULIN ASPART 100 UNIT/ML ~~LOC~~ SOLN
0.0000 [IU] | Freq: Three times a day (TID) | SUBCUTANEOUS | Status: DC
  Administered 2017-09-02: 2 [IU] via SUBCUTANEOUS
  Administered 2017-09-02: 5 [IU] via SUBCUTANEOUS
  Administered 2017-09-02 – 2017-09-03 (×2): 3 [IU] via SUBCUTANEOUS
  Administered 2017-09-03: 5 [IU] via SUBCUTANEOUS
  Administered 2017-09-04: 2 [IU] via SUBCUTANEOUS
  Administered 2017-09-04: 5 [IU] via SUBCUTANEOUS

## 2017-09-01 MED ORDER — IRBESARTAN 150 MG PO TABS: 150.0000 mg | ORAL_TABLET | Freq: Every day | ORAL | Status: DC | PRN

## 2017-09-01 MED ORDER — BUDESONIDE 0.5 MG/2ML IN SUSP
0.5000 mg | Freq: Two times a day (BID) | RESPIRATORY_TRACT | Status: DC
  Administered 2017-09-01 – 2017-09-04 (×7): 0.5 mg via RESPIRATORY_TRACT
  Filled 2017-09-01 (×7): qty 2

## 2017-09-01 MED ORDER — LEVALBUTEROL HCL 0.63 MG/3ML IN NEBU
1.2500 mg | INHALATION_SOLUTION | Freq: Four times a day (QID) | RESPIRATORY_TRACT | Status: DC
  Administered 2017-09-01 – 2017-09-03 (×9): 1.25 mg via RESPIRATORY_TRACT
  Filled 2017-09-01 (×7): qty 6

## 2017-09-01 MED ORDER — INSULIN ASPART 100 UNIT/ML ~~LOC~~ SOLN
0.0000 [IU] | Freq: Every day | SUBCUTANEOUS | Status: DC
Start: 2017-09-01 — End: 2017-09-04
  Administered 2017-09-03: 2 [IU] via SUBCUTANEOUS

## 2017-09-01 MED ORDER — LEVALBUTEROL HCL 1.25 MG/0.5ML IN NEBU
INHALATION_SOLUTION | RESPIRATORY_TRACT | Status: AC
  Administered 2017-09-01: 1.25 mg
  Filled 2017-09-01: qty 0.5

## 2017-09-01 MED ORDER — METHYLPREDNISOLONE SODIUM SUCC 40 MG IJ SOLR
40.0000 mg | Freq: Three times a day (TID) | INTRAMUSCULAR | Status: AC
  Administered 2017-09-01 – 2017-09-03 (×8): 40 mg via INTRAVENOUS
  Filled 2017-09-01 (×9): qty 1

## 2017-09-01 NOTE — Progress Notes (Signed)
PHARMACIST - PHYSICIAN ORDER COMMUNICATION  CONCERNING: P&T Medication Policy on Herbal Medications  DESCRIPTION:  This patient's order for:  garlic  has been noted.  This product(s) is classified as an "herbal" or natural product. Due to a lack of definitive safety studies or FDA approval, nonstandard manufacturing practices, plus the potential risk of unknown drug-drug interactions while on inpatient medications, the Pharmacy and Therapeutics Committee does not permit the use of "herbal" or natural products of this type within [facility].   ACTION TAKEN: The pharmacy department is unable to verify this order at this time and your patient has been informed of this safety policy. Please reevaluate patient's clinical condition at discharge and address if the herbal or natural product(s) should be resumed at that time.  Thank you,   Fabio Neighbors, PharmD

## 2017-09-01 NOTE — ED Notes (Signed)
Restricted extremity bracelet placed on right hand due to breast cancer history

## 2017-09-01 NOTE — Progress Notes (Signed)
PROGRESS NOTE  Eileen [name] KPT:[id number] DOB: 20-Feb-1946 DOA: 08/31/2017 PCP: Lavella Lemons, PA  Brief History:  72 y/o female with a hx of COPD, right breast cancer in remission, HTN, atrial fibrillation, and chronic respiratory failure on 2.5L at home presenting with 5 day hx of worsening sob.  She recently saw Dr. Luan Pulling and she was given doxy and a prednisone taper.  She initially felt better, but when she ran out of her prednisone on 6/14, she subsequently became more sob on 08/26/17.  She tried using her nebulizer treatments at home without relief.  She denies any orthopnea or worsening lower extremity edema.  As a result, she presently to the ED for further medical care.  Patient denies fevers, chills, headache, chest pain,  nausea, vomiting, diarrhea, abdominal pain, dysuria, hematuria, hematochezia, and melena.   Assessment/Plan: Acute on Chronic Respiratory Failure with hypoxia -secondary to COPD exacerbation -wean oxygen back to baseline 2.5L as tolerated -pulmonary hygiene -presently on 4L  COPD Exacerbation -start pulmicort -continue xopenex and atrovent -increase xopenex dose -continue IV solumedrol -personally reviewed CXR--bibasilar opacity, unchanged vs 07/17/17  Essential Hypertension -continue diltiazem CD and metoprolol -holding ARB/HCTZ due to hyperkalemia  Hyperkalemia -IVF -anticipate improvement with IVF  Diabetes mellitus type 2 -Check hemoglobin A1c -NovoLog sliding scale  Atrial fibrillation, type unspecified -Continue diltiazem CD -Continue rivaroxaban -Presently in sinus rhythm  Hyperlipidemia -Continue statin   Disposition Plan:   Home in 2-3 days  Family Communication:   No Family at bedside  Consultants:  none  Code Status:  FULL   DVT Prophylaxis: Rivaroxaban   Procedures: As Listed in Progress Note Above  Antibiotics: Doxy 6/21>>>6/22    Subjective: Patient is breathing better but still complains of  dyspnea with exertion.  She denies any fevers, chills, chest pain, nausea, vomiting, diarrhea, abdominal pain, dysuria, hematuria but there is no rashes.  Objective: Vitals:   09/01/17 0133 09/01/17 0155 09/01/17 0500 09/01/17 0801  BP: 111/62  122/70   Pulse: 95  82   Resp:      Temp:   98.2 F (36.8 C)   TempSrc:   Oral   SpO2:  95% 94% 94%  Weight:      Height:        Intake/Output Summary (Last 24 hours) at 09/01/2017 0946 Last data filed at 09/01/2017 0500 Gross per 24 hour  Intake 240 ml  Output -  Net 240 ml   Weight change:  Exam:   General:  Pt is alert, follows commands appropriately, not in acute distress  HEENT: No icterus, No thrush, No neck mass, [place]/AT  Cardiovascular: RRR, S1/S2, no rubs, no gallops  Respiratory: CTA bilaterally, no wheezing, no crackles, no rhonchi  Abdomen: Soft/+BS, non tender, non distended, no guarding  Extremities: No edema, No lymphangitis, No petechiae, No rashes, no synovitis   Data Reviewed: I have personally reviewed following labs and imaging studies Basic Metabolic Panel: Recent Labs  Lab 08/31/17 1944 09/01/17 0631  NA 140 143  K 4.8 5.2*  CL 98* 98*  CO2 34* 35*  GLUCOSE 211* 141*  BUN 13 12  CREATININE 0.72 0.64  CALCIUM 9.6 9.5   Liver Function Tests: No results for input(s): AST, ALT, ALKPHOS, BILITOT, PROT, ALBUMIN in the last 168 hours. No results for input(s): LIPASE, AMYLASE in the last 168 hours. No results for input(s): AMMONIA in the last 168 hours. Coagulation Profile: No results for input(s): INR,  PROTIME in the last 168 hours. CBC: Recent Labs  Lab 08/31/17 1944 09/01/17 0631  WBC 12.8* 12.2*  HGB 13.9 12.8  HCT 44.6 42.6  MCV 98.5 98.8  PLT 263 255   Cardiac Enzymes: Recent Labs  Lab 08/31/17 1944  TROPONINI <0.03   BNP: Invalid input(s): POCBNP CBG: Recent Labs  Lab 09/01/17 0039 09/01/17 0723  GLUCAP 213* 137*   HbA1C: No results for input(s): HGBA1C in the last 72  hours. Urine analysis:    Component Value Date/Time   COLORURINE YELLOW 09/09/2009 1010   APPEARANCEUR HAZY (A) 09/09/2009 1010   LABSPEC 1.030 09/09/2009 1010   PHURINE 5.5 09/09/2009 1010   GLUCOSEU NEGATIVE 09/09/2009 1010   HGBUR NEGATIVE 09/09/2009 1010   BILIRUBINUR NEGATIVE 09/09/2009 1010   KETONESUR NEGATIVE 09/09/2009 1010   PROTEINUR NEGATIVE 09/09/2009 1010   UROBILINOGEN 0.2 09/09/2009 1010   NITRITE NEGATIVE 09/09/2009 1010   LEUKOCYTESUR  09/09/2009 1010    NEGATIVE MICROSCOPIC NOT DONE ON URINES WITH NEGATIVE PROTEIN, BLOOD, LEUKOCYTES, NITRITE, OR GLUCOSE <1000 mg/dL.   Sepsis Labs: @LABRCNTIP (procalcitonin:4,lacticidven:4) )No results found for this or any previous visit (from the past 240 hour(s)).   Scheduled Meds: . acetaminophen  500 mg Oral QODAY  . diltiazem  240 mg Oral Daily  . doxycycline  100 mg Oral Q12H  . insulin aspart  0-20 Units Subcutaneous TID WC  . insulin aspart  0-5 Units Subcutaneous QHS  . ipratropium  0.5 mg Nebulization Q6H  . levalbuterol  0.63 mg Nebulization Q6H  . methylPREDNISolone (SOLU-MEDROL) injection  40 mg Intravenous Q12H  . metoprolol tartrate  50 mg Oral BID  . rivaroxaban  10 mg Oral Daily  . rosuvastatin  10 mg Oral QPM  . sodium chloride flush  3 mL Intravenous Q12H  . vitamin C  250 mg Oral Daily  . vitamin C   Oral Daily  . Vitamin D (Ergocalciferol)  50,000 Units Oral Q30 days   Continuous Infusions: . sodium chloride      Procedures/Studies: Dg Chest 2 View  Result Date: 08/31/2017 CLINICAL DATA:  72 year old female with increasing shortness of breath for 2-3 days. COPD on home oxygen. EXAM: CHEST - 2 VIEW COMPARISON:  07/17/2017 and earlier. FINDINGS: Stable large lung volumes. Stable mild cardiomegaly and mediastinal contours. No pneumothorax, pulmonary edema or pleural effusion. Increased streaky opacity at both lung bases. No consolidation. No acute osseous abnormality identified. Negative visible bowel  gas pattern. IMPRESSION: Chronic lung disease with Emphysema (ICD10-J43.9). Increased streaky opacity at both lung bases suspicious for acute infectious exacerbation. No pleural effusion. Electronically Signed   By: Genevie Ann M.D.   On: 08/31/2017 21:56    Orson Eva, DO  [facility] Pager [phone]  If 7PM-7AM, please contact night-coverage [email or web address] Password [facility] 09/01/2017, 9:46 AM   LOS: 1 day

## 2017-09-02 LAB — GLUCOSE, CAPILLARY
GLUCOSE-CAPILLARY: 159 mg/dL — AB (ref 65–99)
GLUCOSE-CAPILLARY: 255 mg/dL — AB (ref 65–99)
Glucose-Capillary: 212 mg/dL — ABNORMAL HIGH (ref 65–99)
Glucose-Capillary: 175 mg/dL — ABNORMAL HIGH (ref 65–99)

## 2017-09-02 MED ORDER — LEVALBUTEROL HCL 1.25 MG/0.5ML IN NEBU
INHALATION_SOLUTION | RESPIRATORY_TRACT | Status: AC
  Filled 2017-09-02: qty 0.5

## 2017-09-02 NOTE — Progress Notes (Signed)
PROGRESS NOTE  [name] Ruby WRU:[id number] DOB: 01-30-1946 DOA: 08/31/2017 PCP: Lavella Lemons, PA  Brief History:  72 y/o female with a hx of COPD, right breast cancer in remission, HTN, atrial fibrillation, and chronic respiratory failure on 2.5L at home presenting with 5 day hx of worsening sob.  She recently saw Dr. Luan Pulling and she was given doxy and a prednisone taper.  She initially felt better, but when she ran out of her prednisone on 6/14, she subsequently became more sob on 08/26/17.  She tried using her nebulizer treatments at home without relief.  She denies any orthopnea or worsening lower extremity edema.  As a result, she presently to the ED for further medical care.  Patient denies fevers, chills, headache, chest pain,  nausea, vomiting, diarrhea, abdominal pain, dysuria, hematuria, hematochezia, and melena.   Assessment/Plan: Acute on Chronic Respiratory Failure with hypoxia -secondary to COPD exacerbation -wean oxygen back to baseline 2.5-3L as tolerated -pulmonary hygiene -presently on 3.5L  COPD Exacerbation -continue pulmicort -continue xopenex and atrovent -increase xopenex dose -continue IV solumedrol -personally reviewed CXR--bibasilar opacity, unchanged vs 07/17/17  Essential Hypertension -continue diltiazem CD and metoprolol -holding ARB/HCTZ due to hyperkalemia  Hyperkalemia -IVF -anticipate improvement with IVF  Diabetes mellitus type 2 -Check hemoglobin A1c -NovoLog sliding scale  Atrial fibrillation, type unspecified -Continue diltiazem CD -Continue rivaroxaban -Presently in sinus rhythm  Hyperlipidemia -Continue statin   Disposition Plan:   Home in 1-2 days  Family Communication:   Daughter updated at bedside 6/23  Consultants:  none  Code Status:  FULL   DVT Prophylaxis: Rivaroxaban   Procedures: As Listed in Progress Note Above  Antibiotics: Doxy 6/21>>>6/22        Subjective: Pt state  breathing is nearly 50% better.  Still has some dyspnea with mild exertion.  No n/v/d, abd pain.  Still nonproductive cough without hemoptysis  Objective: Vitals:   09/02/17 0737 09/02/17 1007 09/02/17 1341 09/02/17 1354  BP:  (!) 114/55 103/63   Pulse:  86 78   Resp:   (!) 21   Temp:   98.5 F (36.9 C)   TempSrc:      SpO2: 95%  90% (!) 89%  Weight:      Height:        Intake/Output Summary (Last 24 hours) at 09/02/2017 1741 Last data filed at 09/02/2017 1300 Gross per 24 hour  Intake 720 ml  Output -  Net 720 ml   Weight change:  Exam:   General:  Pt is alert, follows commands appropriately, not in acute distress  HEENT: No icterus, No thrush, No neck mass, [place]/AT  Cardiovascular: RRR, S1/S2, no rubs, no gallops  Respiratory: bibasilar rales, no wheeze, diminished BS bilateral  Abdomen: Soft/+BS, non tender, non distended, no guarding  Extremities: No edema, No lymphangitis, No petechiae, No rashes, no synovitis   Data Reviewed: I have personally reviewed following labs and imaging studies Basic Metabolic Panel: Recent Labs  Lab 08/31/17 1944 09/01/17 0631  NA 140 143  K 4.8 5.2*  CL 98* 98*  CO2 34* 35*  GLUCOSE 211* 141*  BUN 13 12  CREATININE 0.72 0.64  CALCIUM 9.6 9.5   Liver Function Tests: No results for input(s): AST, ALT, ALKPHOS, BILITOT, PROT, ALBUMIN in the last 168 hours. No results for input(s): LIPASE, AMYLASE in the last 168 hours. No results for input(s): AMMONIA in the last 168 hours. Coagulation Profile: No results for  input(s): INR, PROTIME in the last 168 hours. CBC: Recent Labs  Lab 08/31/17 1944 09/01/17 0631  WBC 12.8* 12.2*  HGB 13.9 12.8  HCT 44.6 42.6  MCV 98.5 98.8  PLT 263 255   Cardiac Enzymes: Recent Labs  Lab 08/31/17 1944  TROPONINI <0.03   BNP: Invalid input(s): POCBNP CBG: Recent Labs  Lab 09/01/17 1627 09/01/17 1940 09/02/17 0740 09/02/17 1112 09/02/17 1614  GLUCAP 88 289* 159* 255* 212*    HbA1C: Recent Labs    08/31/17 1944  HGBA1C 6.9*   Urine analysis:    Component Value Date/Time   COLORURINE YELLOW 09/09/2009 1010   APPEARANCEUR HAZY (A) 09/09/2009 1010   LABSPEC 1.030 09/09/2009 1010   PHURINE 5.5 09/09/2009 1010   GLUCOSEU NEGATIVE 09/09/2009 1010   HGBUR NEGATIVE 09/09/2009 1010   BILIRUBINUR NEGATIVE 09/09/2009 1010   KETONESUR NEGATIVE 09/09/2009 1010   PROTEINUR NEGATIVE 09/09/2009 1010   UROBILINOGEN 0.2 09/09/2009 1010   NITRITE NEGATIVE 09/09/2009 1010   LEUKOCYTESUR  09/09/2009 1010    NEGATIVE MICROSCOPIC NOT DONE ON URINES WITH NEGATIVE PROTEIN, BLOOD, LEUKOCYTES, NITRITE, OR GLUCOSE <1000 mg/dL.   Sepsis Labs: @LABRCNTIP (procalcitonin:4,lacticidven:4) )No results found for this or any previous visit (from the past 240 hour(s)).   Scheduled Meds: . acetaminophen  500 mg Oral QODAY  . budesonide (PULMICORT) nebulizer solution  0.5 mg Nebulization BID  . diltiazem  240 mg Oral Daily  . insulin aspart  0-5 Units Subcutaneous QHS  . insulin aspart  0-9 Units Subcutaneous TID WC  . ipratropium  0.5 mg Nebulization Q6H  . levalbuterol  1.25 mg Nebulization Q6H  . methylPREDNISolone (SOLU-MEDROL) injection  40 mg Intravenous Q8H  . metoprolol tartrate  50 mg Oral BID  . rivaroxaban  10 mg Oral Daily  . rosuvastatin  10 mg Oral QPM  . sodium chloride flush  3 mL Intravenous Q12H  . vitamin C  250 mg Oral Daily  . vitamin C   Oral Daily  . Vitamin D (Ergocalciferol)  50,000 Units Oral Q30 days   Continuous Infusions: . sodium chloride      Procedures/Studies: Dg Chest 2 View  Result Date: 08/31/2017 CLINICAL DATA:  72 year old female with increasing shortness of breath for 2-3 days. COPD on home oxygen. EXAM: CHEST - 2 VIEW COMPARISON:  07/17/2017 and earlier. FINDINGS: Stable large lung volumes. Stable mild cardiomegaly and mediastinal contours. No pneumothorax, pulmonary edema or pleural effusion. Increased streaky opacity at both lung  bases. No consolidation. No acute osseous abnormality identified. Negative visible bowel gas pattern. IMPRESSION: Chronic lung disease with Emphysema (ICD10-J43.9). Increased streaky opacity at both lung bases suspicious for acute infectious exacerbation. No pleural effusion. Electronically Signed   By: Genevie Ann M.D.   On: 08/31/2017 21:56    Orson Eva, DO  [facility] Pager [phone]  If 7PM-7AM, please contact night-coverage [email or web address] Password [facility] 09/02/2017, 5:41 PM   LOS: 2 days

## 2017-09-03 DIAGNOSIS — J9601 Acute respiratory failure with hypoxia: Secondary | ICD-10-CM

## 2017-09-03 LAB — BASIC METABOLIC PANEL
Anion gap: 6 (ref 5–15)
BUN: 23 mg/dL — AB (ref 6–20)
CO2: 40 mmol/L — ABNORMAL HIGH (ref 22–32)
CREATININE: 0.74 mg/dL (ref 0.44–1.00)
Calcium: 9.5 mg/dL (ref 8.9–10.3)
Chloride: 95 mmol/L — ABNORMAL LOW (ref 101–111)
Glucose, Bld: 206 mg/dL — ABNORMAL HIGH (ref 65–99)
Potassium: 4.8 mmol/L (ref 3.5–5.1)
SODIUM: 141 mmol/L (ref 135–145)

## 2017-09-03 LAB — CBC
HCT: 43.5 % (ref 36.0–46.0)
Hemoglobin: 12.8 g/dL (ref 12.0–15.0)
MCH: 29.2 pg (ref 26.0–34.0)
MCHC: 29.4 g/dL — ABNORMAL LOW (ref 30.0–36.0)
MCV: 99.3 fL (ref 78.0–100.0)
PLATELETS: 280 10*3/uL (ref 150–400)
RBC: 4.38 MIL/uL (ref 3.87–5.11)
RDW: 13.4 % (ref 11.5–15.5)
WBC: 18.2 10*3/uL — ABNORMAL HIGH (ref 4.0–10.5)

## 2017-09-03 LAB — GLUCOSE, CAPILLARY
GLUCOSE-CAPILLARY: 218 mg/dL — AB (ref 65–99)
GLUCOSE-CAPILLARY: 260 mg/dL — AB (ref 65–99)
GLUCOSE-CAPILLARY: 238 mg/dL — AB (ref 65–99)
Glucose-Capillary: 112 mg/dL — ABNORMAL HIGH (ref 65–99)

## 2017-09-03 MED ORDER — PREDNISONE 20 MG PO TABS
60.0000 mg | ORAL_TABLET | Freq: Every day | ORAL | Status: DC
Start: 2017-09-04 — End: 2017-09-04
  Administered 2017-09-04: 60 mg via ORAL
  Filled 2017-09-03: qty 3

## 2017-09-03 MED ORDER — SODIUM CHLORIDE 0.9 % IV SOLN
INTRAVENOUS | Status: AC
  Administered 2017-09-03: 19:00:00 via INTRAVENOUS

## 2017-09-03 NOTE — Care Management Note (Signed)
Case Management Note  Patient Details  Name: Eileen [name] MRN: [id number] Date of Birth: 09/18/1945  Subjective/Objective:     Admitted with COPD exacerbation. Chart reviewed for CM needs. Pt from home, lives alone. Has PCP, uses [facility], has no difficulty with transportation. Pt is on home oxygen and has neb machine pta.                 Action/Plan: DC home with self care. No CM needs noted. Pt will get referral for [facility] transition calls.   Expected Discharge Date:       09/04/2017           Expected Discharge Plan:  Home/Self Care  In-House Referral:  NA  Discharge planning Services  CM Consult  Post Acute Care Choice:  NA Choice offered to:  NA  DME Arranged:    DME Agency:     HH Arranged:    HH Agency:     Status of Service:  Completed, signed off  If discussed at [employer] of Stay Meetings, dates discussed:    Additional Comments:  Sherald Barge, RN 09/03/2017, 2:57 PM

## 2017-09-03 NOTE — Progress Notes (Addendum)
PROGRESS NOTE  Eileen [name] WSF:[id number] DOB: 1945/07/12 DOA: 08/31/2017 PCP: Lavella Lemons, PA   Brief History: 72 y/o female with a hx of COPD, right breast cancer in remission, HTN, atrial fibrillation, and chronic respiratory failure on 2.5L at home presenting with 5 day hx of worsening sob. She recently saw Dr. Luan Pulling and she was given doxy and a prednisone taper. She initially felt better, but when she ran out of her prednisone on 6/14, she subsequently became more sob on 08/26/17. She tried using her nebulizer treatments at home without relief. She denies any orthopnea or worsening lower extremity edema. As a result, she presently to the ED for further medical care. Patient denies fevers, chills, headache, chest pain, nausea, vomiting, diarrhea, abdominal pain, dysuria, hematuria, hematochezia, and melena.  The patient was started on intravenous site Medrol and bronchodilators with clinical improvement.   Assessment/Plan: Acute on Chronic Respiratory Failure with hypoxia -secondary to COPD exacerbation -wean oxygen back to baseline 2.5-3L as tolerated -pulmonary hygiene -presently on 3.5L  COPD Exacerbation -continue pulmicort -continue xopenex and atrovent -increase xopenex dose -continue IV solumedrol>>po prednisone 6/25 -personally reviewed CXR--bibasilar opacity, unchanged vs 07/17/17  Essential Hypertension -continue diltiazem CD and metoprolol -holding ARB/HCTZ due to hyperkalemia  Hyperkalemia -IVF -Improvement with IVF  Diabetes mellitus type 2 -Check hemoglobin A1c--6.9 -NovoLog sliding scale  Atrial fibrillation, type unspecified -Continue diltiazem CD -Continue rivaroxaban -Presently in sinus rhythm  Hyperlipidemia -Continue statin   Disposition Plan: Home 6/25 if stable  Family Communication:Daughter updated at bedside 6/24  Consultants:none  Code Status: FULL   DVT  Prophylaxis:Rivaroxaban   Procedures: As Listed in Progress Note Above  Antibiotics: Doxy 6/21>>>6/22     Subjective: Overall patient is breathing better.  She still has some dyspnea on exertion but it is improving.  She denies any chest pain, nausea, vomiting, diarrhea, abdominal pain.  She denies any fevers or chills  Objective: Vitals:   09/03/17 0744 09/03/17 0848 09/03/17 1500 09/03/17 1630  BP:    116/65  Pulse:  85  71  Resp:      Temp:      TempSrc:      SpO2: 95%  95% 91%  Weight:      Height:        Intake/Output Summary (Last 24 hours) at 09/03/2017 1739 Last data filed at 09/03/2017 1300 Gross per 24 hour  Intake 600 ml  Output -  Net 600 ml   Weight change:  Exam:   General:  Pt is alert, follows commands appropriately, not in acute distress  HEENT: No icterus, No thrush, No neck mass, [place]/AT  Cardiovascular: RRR, S1/S2, no rubs, no gallops  Respiratory: Scattered bilateral rales.  Diminished breath sounds bilateral.  Abdomen: Soft/+BS, non tender, non distended, no guarding  Extremities: Trace edema, No lymphangitis, No petechiae, No rashes, no synovitis   Data Reviewed: I have personally reviewed following labs and imaging studies Basic Metabolic Panel: Recent Labs  Lab 08/31/17 1944 09/01/17 0631 09/03/17 0434  NA 140 143 141  K 4.8 5.2* 4.8  CL 98* 98* 95*  CO2 34* 35* 40*  GLUCOSE 211* 141* 206*  BUN 13 12 23*  CREATININE 0.72 0.64 0.74  CALCIUM 9.6 9.5 9.5   Liver Function Tests: No results for input(s): AST, ALT, ALKPHOS, BILITOT, PROT, ALBUMIN in the last 168 hours. No results for input(s): LIPASE, AMYLASE in the last 168 hours. No results for input(s): AMMONIA  in the last 168 hours. Coagulation Profile: No results for input(s): INR, PROTIME in the last 168 hours. CBC: Recent Labs  Lab 08/31/17 1944 09/01/17 0631 09/03/17 0434  WBC 12.8* 12.2* 18.2*  HGB 13.9 12.8 12.8  HCT 44.6 42.6 43.5  MCV 98.5 98.8 99.3   PLT 263 255 280   Cardiac Enzymes: Recent Labs  Lab 08/31/17 1944  TROPONINI <0.03   BNP: Invalid input(s): POCBNP CBG: Recent Labs  Lab 09/02/17 1614 09/02/17 2208 09/03/17 0744 09/03/17 1125 09/03/17 1630  GLUCAP 212* 175* 218* 260* 112*   HbA1C: Recent Labs    08/31/17 1944  HGBA1C 6.9*   Urine analysis:    Component Value Date/Time   COLORURINE YELLOW 09/09/2009 1010   APPEARANCEUR HAZY (A) 09/09/2009 1010   LABSPEC 1.030 09/09/2009 1010   PHURINE 5.5 09/09/2009 1010   GLUCOSEU NEGATIVE 09/09/2009 1010   HGBUR NEGATIVE 09/09/2009 1010   BILIRUBINUR NEGATIVE 09/09/2009 1010   KETONESUR NEGATIVE 09/09/2009 1010   PROTEINUR NEGATIVE 09/09/2009 1010   UROBILINOGEN 0.2 09/09/2009 1010   NITRITE NEGATIVE 09/09/2009 1010   LEUKOCYTESUR  09/09/2009 1010    NEGATIVE MICROSCOPIC NOT DONE ON URINES WITH NEGATIVE PROTEIN, BLOOD, LEUKOCYTES, NITRITE, OR GLUCOSE <1000 mg/dL.   Sepsis Labs: @LABRCNTIP (procalcitonin:4,lacticidven:4) )No results found for this or any previous visit (from the past 240 hour(s)).   Scheduled Meds: . acetaminophen  500 mg Oral QODAY  . budesonide (PULMICORT) nebulizer solution  0.5 mg Nebulization BID  . diltiazem  240 mg Oral Daily  . insulin aspart  0-5 Units Subcutaneous QHS  . insulin aspart  0-9 Units Subcutaneous TID WC  . ipratropium  0.5 mg Nebulization Q6H  . levalbuterol  1.25 mg Nebulization Q6H  . methylPREDNISolone (SOLU-MEDROL) injection  40 mg Intravenous Q8H  . metoprolol tartrate  50 mg Oral BID  . rivaroxaban  10 mg Oral Daily  . rosuvastatin  10 mg Oral QPM  . sodium chloride flush  3 mL Intravenous Q12H  . vitamin C  250 mg Oral Daily  . vitamin C   Oral Daily  . Vitamin D (Ergocalciferol)  50,000 Units Oral Q30 days   Continuous Infusions: . sodium chloride      Procedures/Studies: Dg Chest 2 View  Result Date: 08/31/2017 CLINICAL DATA:  72 year old female with increasing shortness of breath for 2-3 days.  COPD on home oxygen. EXAM: CHEST - 2 VIEW COMPARISON:  07/17/2017 and earlier. FINDINGS: Stable large lung volumes. Stable mild cardiomegaly and mediastinal contours. No pneumothorax, pulmonary edema or pleural effusion. Increased streaky opacity at both lung bases. No consolidation. No acute osseous abnormality identified. Negative visible bowel gas pattern. IMPRESSION: Chronic lung disease with Emphysema (ICD10-J43.9). Increased streaky opacity at both lung bases suspicious for acute infectious exacerbation. No pleural effusion. Electronically Signed   By: Genevie Ann M.D.   On: 08/31/2017 21:56    Orson Eva, DO  [facility] Pager [phone]  If 7PM-7AM, please contact night-coverage [email or web address] Password [facility] 09/03/2017, 5:39 PM   LOS: 3 days

## 2017-09-03 NOTE — Progress Notes (Signed)
Inpatient Diabetes Program Recommendations  AACE/ADA: New Consensus Statement on Inpatient Glycemic Control (2019)  Target Ranges:  Prepandial:   less than 140 mg/dL      Peak postprandial:   less than 180 mg/dL (1-2 hours)      Critically ill patients:  140 - 180 mg/dL   Results for [place], [name] (MRN [id number]) as of 09/03/2017 07:31  Ref. Range 09/02/2017 07:40 09/02/2017 11:12 09/02/2017 16:14 09/02/2017 22:08  Glucose-Capillary Latest Ref Range: 65 - 99 mg/dL 159 (H) 255 (H) 212 (H) 175 (H)  Results for [place], [name] (MRN [id number]) as of 09/03/2017 07:31  Ref. Range 08/31/2017 19:44  Hemoglobin A1C Latest Ref Range: 4.8 - 5.6 % 6.9 (H)   Review of Glycemic Control  Diabetes history: DM2 Outpatient Diabetes medications: Metformin XR 500 mg daily Current orders for Inpatient glycemic control: Novolog 0-9 units TID with meals, Novolog 0-5 units QHS; Solumedrol 40 mg Q12H  Inpatient Diabetes Program Recommendations: Insulin - Meal Coverage: If steroids will be continued, please consider ordering Novolog 3 units TID with meals for meal coverage if patient eats at least 50% of meals. HgbA1C: A1C 6.9% on 08/31/17 indicating an average glucose of 151 mg/dl over the past 2-3 months.  Thanks, Barnie Alderman, RN, MSN, CDE Diabetes Coordinator Inpatient Diabetes Program [phone] (Team Pager from 8am to 5pm)

## 2017-09-03 NOTE — Care Management Important Message (Signed)
Important Message  Patient Details  Name: Eileen [name] MRN: [id number] Date of Birth: [birth year]   Medicare Important Message Given:  Yes    [name] 09/03/2017, 12:28 PM

## 2017-09-04 LAB — BASIC METABOLIC PANEL
Anion gap: 7 (ref 5–15)
BUN: 20 mg/dL (ref 8–23)
CALCIUM: 9 mg/dL (ref 8.9–10.3)
CO2: 38 mmol/L — ABNORMAL HIGH (ref 22–32)
CREATININE: 0.64 mg/dL (ref 0.44–1.00)
Chloride: 98 mmol/L (ref 98–111)
GFR calc Af Amer: >60 mL/min (ref >60)
Glucose, Bld: 214 mg/dL — ABNORMAL HIGH (ref 70–99)
POTASSIUM: 4.8 mmol/L (ref 3.5–5.1)
SODIUM: 143 mmol/L (ref 135–145)

## 2017-09-04 LAB — GLUCOSE, CAPILLARY
GLUCOSE-CAPILLARY: 185 mg/dL — AB (ref 70–99)
Glucose-Capillary: 299 mg/dL — ABNORMAL HIGH (ref 70–99)

## 2017-09-04 MED ORDER — LEVALBUTEROL HCL 1.25 MG/0.5ML IN NEBU
INHALATION_SOLUTION | RESPIRATORY_TRACT | Status: AC
  Administered 2017-09-04: 1.25 mg
  Filled 2017-09-04: qty 0.5

## 2017-09-04 MED ORDER — PREDNISONE 10 MG PO TABS: 60.0000 mg | ORAL_TABLET | Freq: Every day | ORAL | 0 refills | Status: DC

## 2017-09-04 NOTE — Discharge Summary (Signed)
Physician Discharge Summary  [name] Kallstrom FIE:[id number] DOB: [birth year] DOA: 08/31/2017  PCP: Lavella Lemons, PA  Admit date: 08/31/2017 Discharge date: 09/04/2017  Admitted From:Home Disposition:  Home   Recommendations for Outpatient Follow-up:  1. Follow up with PCP in 1-2 weeks 2. Please obtain BMP/CBC in one week    Discharge Condition: Stable CODE STATUS: FULL Diet recommendation: Heart Healthy / Carb Modified   Brief/Interim Summary: 72 y/o female with a hx of COPD, right breast cancer in remission, HTN, atrial fibrillation, and chronic respiratory failure on 2.5L at home presenting with 5 day hx of worsening sob. She recently saw Dr. Luan Pulling and she was given doxy and a prednisone taper. She initially felt better, but when she ran out of her prednisone on 6/14, she subsequently became more sob on 08/26/17. She tried using her nebulizer treatments at home without relief. She denies any orthopnea or worsening lower extremity edema. As a result, she presently to the ED for further medical care. Patient denies fevers, chills, headache, chest pain, nausea, vomiting, diarrhea, abdominal pain, dysuria, hematuria, hematochezia, and melena.  The patient was started on intravenous solu Medrol and bronchodilators with clinical improvement.    Discharge Diagnoses:   Acute on Chronic Respiratory Failure with hypoxia -secondary to COPD exacerbation -wean oxygen back to baseline 2.5-3L as tolerated -pulmonary hygiene -presently on3.5L>>>weaned to 2.5 L  COPD Exacerbation -continuepulmicort -continue xopenex and atrovent -increase xopenex dose -continue IV solumedrol>>po prednisone 6/25>>home with taper back to baseline dose of 10 mg daily -personally reviewed CXR--bibasilar opacity, unchanged vs 07/17/17 -resume Trelegy at home  Essential Hypertension -continue diltiazem CD and metoprolol -holding ARB/HCTZ due to hyperkalemia  Hyperkalemia -IVF -Improvement with  IVF  Diabetes mellitus type 2 -Check hemoglobin A1c--6.9 -NovoLog sliding scale -CBGs elevated due to steroids -resume metformin after d/c  Atrial fibrillation, type unspecified -Continue diltiazem CD -Continue rivaroxaban -Presently in sinus rhythm  Hyperlipidemia -Continue statin    Discharge Instructions   Allergies as of 09/04/2017      Reactions   Codeine Other (See Comments)   "jittery"      Medication List    TAKE these medications   acetaminophen 500 MG tablet Commonly known as:  TYLENOL Take 500 mg by mouth every other day.   blood glucose meter kit and supplies Kit Dispense based on patient and insurance preference. Use up to four times daily as directed. (FOR ICD-9 250.00, 250.01).  Check sugar before meals and at night, keep a log.   diltiazem 240 MG 24 hr capsule Commonly known as:  CARDIZEM CD TAKE ONE CAPSULE BY MOUTH DAILY.   EMERGEN-C VITAMIN C PO Take 1 Package by mouth daily.   GARLIQUE PO Take 1 tablet by mouth daily.   metFORMIN 500 MG 24 hr tablet Commonly known as:  GLUCOPHAGE-XR Take 500 mg by mouth daily.   metoprolol tartrate 50 MG tablet Commonly known as:  LOPRESSOR Take 1 tablet (50 mg total) by mouth 2 (two) times daily.   OXYGEN Inhale 2.5-3 L into the lungs daily. At night and daily as needed   predniSONE 10 MG tablet Commonly known as:  DELTASONE Take 10 mg by mouth daily with breakfast. What changed:  Another medication with the same name was added. Make sure you understand how and when to take each.   predniSONE 10 MG tablet Commonly known as:  DELTASONE Take 6 tablets (60 mg total) by mouth daily with breakfast. And decrease by one tablet daily Start taking on:  09/05/2017 What changed:  You were already taking a medication with the same name, and this prescription was added. Make sure you understand how and when to take each.   albuterol (2.5 MG/3ML) 0.083% nebulizer solution Commonly known as:   PROVENTIL Take 2.5 mg by nebulization every 6 (six) hours as needed for wheezing or shortness of breath.   PROAIR HFA 108 (90 Base) MCG/ACT inhaler Generic drug:  albuterol Inhale 2 puffs into the lungs every 6 (six) hours as needed for wheezing or shortness of breath.   rosuvastatin 10 MG tablet Commonly known as:  CRESTOR Take 10 mg by mouth every evening.   TRELEGY ELLIPTA 100-62.5-25 MCG/INH Aepb Generic drug:  Fluticasone-Umeclidin-Vilant Take 1 puff by mouth daily.   VITAMIN C PO Take 1 tablet by mouth daily.   Vitamin D (Ergocalciferol) 50000 units Caps capsule Commonly known as:  DRISDOL Take 50,000 Units every 30 (thirty) days by mouth.   XARELTO 20 MG Tabs tablet Generic drug:  rivaroxaban TAKE ONE TABLET BY MOUTH DAILY WITH SUPPER.       Allergies  Allergen Reactions  . Codeine Other (See Comments)    "jittery"    Consultations:  none   Procedures/Studies: Dg Chest 2 View  Result Date: 08/31/2017 CLINICAL DATA:  72 year old female with increasing shortness of breath for 2-3 days. COPD on home oxygen. EXAM: CHEST - 2 VIEW COMPARISON:  07/17/2017 and earlier. FINDINGS: Stable large lung volumes. Stable mild cardiomegaly and mediastinal contours. No pneumothorax, pulmonary edema or pleural effusion. Increased streaky opacity at both lung bases. No consolidation. No acute osseous abnormality identified. Negative visible bowel gas pattern. IMPRESSION: Chronic lung disease with Emphysema (ICD10-J43.9). Increased streaky opacity at both lung bases suspicious for acute infectious exacerbation. No pleural effusion. Electronically Signed   By: Genevie Ann M.D.   On: 08/31/2017 21:56         Discharge Exam: Vitals:   09/04/17 0802 09/04/17 0832  BP:  120/70  Pulse:  74  Resp:    Temp:    SpO2: 95%    Vitals:   09/04/17 0659 09/04/17 0757 09/04/17 0802 09/04/17 0832  BP: 94/87   120/70  Pulse: 90   74  Resp: 16     Temp: 98.9 F (37.2 C)     TempSrc:       SpO2: 90% 95% 95%   Weight:      Height:        General: Pt is alert, awake, not in acute distress Cardiovascular: RRR, S1/S2 +, no rubs, no gallops Respiratory: scattered bilateral rales, no wheeze Abdominal: Soft, NT, ND, bowel sounds + Extremities: no edema, no cyanosis   The results of significant diagnostics from this hospitalization (including imaging, microbiology, ancillary and laboratory) are listed below for reference.    Significant Diagnostic Studies: Dg Chest 2 View  Result Date: 08/31/2017 CLINICAL DATA:  72 year old female with increasing shortness of breath for 2-3 days. COPD on home oxygen. EXAM: CHEST - 2 VIEW COMPARISON:  07/17/2017 and earlier. FINDINGS: Stable large lung volumes. Stable mild cardiomegaly and mediastinal contours. No pneumothorax, pulmonary edema or pleural effusion. Increased streaky opacity at both lung bases. No consolidation. No acute osseous abnormality identified. Negative visible bowel gas pattern. IMPRESSION: Chronic lung disease with Emphysema (ICD10-J43.9). Increased streaky opacity at both lung bases suspicious for acute infectious exacerbation. No pleural effusion. Electronically Signed   By: Genevie Ann M.D.   On: 08/31/2017 21:56     Microbiology: No results found for  this or any previous visit (from the past 240 hour(s)).   Labs: Basic Metabolic Panel: Recent Labs  Lab 08/31/17 1944 09/01/17 0631 09/03/17 0434 09/04/17 0605  NA 140 143 141 143  K 4.8 5.2* 4.8 4.8  CL 98* 98* 95* 98  CO2 34* 35* 40* 38*  GLUCOSE 211* 141* 206* 214*  BUN 13 12 23* 20  CREATININE 0.72 0.64 0.74 0.64  CALCIUM 9.6 9.5 9.5 9.0   Liver Function Tests: No results for input(s): AST, ALT, ALKPHOS, BILITOT, PROT, ALBUMIN in the last 168 hours. No results for input(s): LIPASE, AMYLASE in the last 168 hours. No results for input(s): AMMONIA in the last 168 hours. CBC: Recent Labs  Lab 08/31/17 1944 09/01/17 0631 09/03/17 0434  WBC 12.8* 12.2*  18.2*  HGB 13.9 12.8 12.8  HCT 44.6 42.6 43.5  MCV 98.5 98.8 99.3  PLT 263 255 280   Cardiac Enzymes: Recent Labs  Lab 08/31/17 1944  TROPONINI <0.03   BNP: Invalid input(s): POCBNP CBG: Recent Labs  Lab 09/03/17 1125 09/03/17 1630 09/03/17 2138 09/04/17 0748 09/04/17 1129  GLUCAP 260* 112* 238* 185* 299*    Time coordinating discharge:  36 minutes  Signed:  Orson Eva, DO [facility] Pager: [phone] 09/04/2017, 1:28 PM

## 2017-09-04 NOTE — Progress Notes (Signed)
Pt discharged home today per Dr. Tat. Pt's IV site D/C'd and WDL. Pt's VSS. Pt provided with home medication list, discharge instructions and prescriptions. Verbalized understanding. Pt left floor via WC in stable condition accompanied by RN. 

## 2017-09-09 DIAGNOSIS — J449 Chronic obstructive pulmonary disease, unspecified: Secondary | ICD-10-CM | POA: Diagnosis not present

## 2017-09-09 DIAGNOSIS — R0902 Hypoxemia: Secondary | ICD-10-CM | POA: Diagnosis not present

## 2017-09-17 ENCOUNTER — Encounter (HOSPITAL_COMMUNITY): Payer: Self-pay | Admitting: Emergency Medicine

## 2017-09-17 ENCOUNTER — Emergency Department (HOSPITAL_COMMUNITY): Payer: Medicare Other

## 2017-09-17 ENCOUNTER — Other Ambulatory Visit: Payer: Self-pay | Admitting: *Deleted

## 2017-09-17 ENCOUNTER — Encounter: Payer: Self-pay | Admitting: *Deleted

## 2017-09-17 ENCOUNTER — Other Ambulatory Visit: Payer: Self-pay

## 2017-09-17 ENCOUNTER — Inpatient Hospital Stay (HOSPITAL_COMMUNITY)
Admission: EM | Admit: 2017-09-17 | Discharge: 2017-09-21 | DRG: 190 | Disposition: A | Discharge status: Home / Self Care | Payer: Medicare Other | Attending: Internal Medicine | Admitting: Internal Medicine

## 2017-09-17 DIAGNOSIS — I4892 Unspecified atrial flutter: Secondary | ICD-10-CM | POA: Diagnosis present

## 2017-09-17 DIAGNOSIS — Z9010 Acquired absence of unspecified breast and nipple: Secondary | ICD-10-CM | POA: Diagnosis not present

## 2017-09-17 DIAGNOSIS — Z79899 Other long term (current) drug therapy: Secondary | ICD-10-CM

## 2017-09-17 DIAGNOSIS — Z9989 Dependence on other enabling machines and devices: Secondary | ICD-10-CM | POA: Diagnosis not present

## 2017-09-17 DIAGNOSIS — Z9981 Dependence on supplemental oxygen: Secondary | ICD-10-CM | POA: Diagnosis not present

## 2017-09-17 DIAGNOSIS — R0902 Hypoxemia: Secondary | ICD-10-CM

## 2017-09-17 DIAGNOSIS — J9620 Acute and chronic respiratory failure, unspecified whether with hypoxia or hypercapnia: Secondary | ICD-10-CM | POA: Diagnosis present

## 2017-09-17 DIAGNOSIS — J9622 Acute and chronic respiratory failure with hypercapnia: Secondary | ICD-10-CM | POA: Diagnosis not present

## 2017-09-17 DIAGNOSIS — J441 Chronic obstructive pulmonary disease with (acute) exacerbation: Principal | ICD-10-CM | POA: Diagnosis present

## 2017-09-17 DIAGNOSIS — Z885 Allergy status to narcotic agent status: Secondary | ICD-10-CM | POA: Diagnosis not present

## 2017-09-17 DIAGNOSIS — Z7951 Long term (current) use of inhaled steroids: Secondary | ICD-10-CM

## 2017-09-17 DIAGNOSIS — J9621 Acute and chronic respiratory failure with hypoxia: Secondary | ICD-10-CM | POA: Diagnosis not present

## 2017-09-17 DIAGNOSIS — T380X5A Adverse effect of glucocorticoids and synthetic analogues, initial encounter: Secondary | ICD-10-CM | POA: Diagnosis not present

## 2017-09-17 DIAGNOSIS — I10 Essential (primary) hypertension: Secondary | ICD-10-CM | POA: Diagnosis present

## 2017-09-17 DIAGNOSIS — E1165 Type 2 diabetes mellitus with hyperglycemia: Secondary | ICD-10-CM | POA: Diagnosis not present

## 2017-09-17 DIAGNOSIS — Z7984 Long term (current) use of oral hypoglycemic drugs: Secondary | ICD-10-CM | POA: Diagnosis not present

## 2017-09-17 DIAGNOSIS — Z853 Personal history of malignant neoplasm of breast: Secondary | ICD-10-CM

## 2017-09-17 DIAGNOSIS — Z7952 Long term (current) use of systemic steroids: Secondary | ICD-10-CM | POA: Diagnosis not present

## 2017-09-17 DIAGNOSIS — Z87891 Personal history of nicotine dependence: Secondary | ICD-10-CM

## 2017-09-17 DIAGNOSIS — J9601 Acute respiratory failure with hypoxia: Secondary | ICD-10-CM | POA: Diagnosis not present

## 2017-09-17 DIAGNOSIS — E118 Type 2 diabetes mellitus with unspecified complications: Secondary | ICD-10-CM | POA: Diagnosis not present

## 2017-09-17 DIAGNOSIS — E119 Type 2 diabetes mellitus without complications: Secondary | ICD-10-CM

## 2017-09-17 DIAGNOSIS — J439 Emphysema, unspecified: Secondary | ICD-10-CM | POA: Diagnosis not present

## 2017-09-17 HISTORY — DX: Type 2 diabetes mellitus without complications: E11.9

## 2017-09-17 HISTORY — DX: Unspecified atrial flutter: I48.92

## 2017-09-17 LAB — CBC WITH DIFFERENTIAL/PLATELET
BASOS PCT: 0 %
Basophils Absolute: 0 10*3/uL (ref 0.0–0.1)
EOS ABS: 0 10*3/uL (ref 0.0–0.7)
Eosinophils Relative: 0 %
HEMATOCRIT: 44.2 % (ref 36.0–46.0)
HEMOGLOBIN: 13.7 g/dL (ref 12.0–15.0)
LYMPHS ABS: 1.5 10*3/uL (ref 0.7–4.0)
Lymphocytes Relative: 9 %
MCH: 30.2 pg (ref 26.0–34.0)
MCHC: 31 g/dL (ref 30.0–36.0)
MCV: 97.6 fL (ref 78.0–100.0)
MONOS PCT: 4 %
Monocytes Absolute: 0.7 10*3/uL (ref 0.1–1.0)
NEUTROS ABS: 14.8 10*3/uL — AB (ref 1.7–7.7)
NEUTROS PCT: 87 %
Platelets: 285 10*3/uL (ref 150–400)
RBC: 4.53 MIL/uL (ref 3.87–5.11)
RDW: 14 % (ref 11.5–15.5)
WBC: 17 10*3/uL — AB (ref 4.0–10.5)

## 2017-09-17 LAB — BASIC METABOLIC PANEL
Anion gap: 10 (ref 5–15)
BUN: 16 mg/dL (ref 8–23)
CHLORIDE: 95 mmol/L — AB (ref 98–111)
CO2: 34 mmol/L — AB (ref 22–32)
CREATININE: 0.86 mg/dL (ref 0.44–1.00)
Calcium: 9.6 mg/dL (ref 8.9–10.3)
GFR calc non Af Amer: >60 mL/min (ref >60)
Glucose, Bld: 234 mg/dL — ABNORMAL HIGH (ref 70–99)
POTASSIUM: 4.4 mmol/L (ref 3.5–5.1)
SODIUM: 139 mmol/L (ref 135–145)

## 2017-09-17 LAB — HEPATIC FUNCTION PANEL
ALT: 18 U/L (ref 0–44)
AST: 19 U/L (ref 15–41)
Albumin: 4.2 g/dL (ref 3.5–5.0)
Alkaline Phosphatase: 56 U/L (ref 38–126)
Bilirubin, Direct: 0.1 mg/dL (ref 0.0–0.2)
Indirect Bilirubin: 0.5 mg/dL (ref 0.3–0.9)
TOTAL PROTEIN: 7.7 g/dL (ref 6.5–8.1)
Total Bilirubin: 0.6 mg/dL (ref 0.3–1.2)

## 2017-09-17 LAB — PHOSPHORUS: PHOSPHORUS: 2.3 mg/dL — AB (ref 2.5–4.6)

## 2017-09-17 LAB — GLUCOSE, CAPILLARY: GLUCOSE-CAPILLARY: 267 mg/dL — AB (ref 70–99)

## 2017-09-17 LAB — D-DIMER, QUANTITATIVE (NOT AT ARMC)

## 2017-09-17 LAB — TROPONIN I

## 2017-09-17 LAB — MAGNESIUM: MAGNESIUM: 1.8 mg/dL (ref 1.7–2.4)

## 2017-09-17 MED ORDER — ALBUTEROL SULFATE (2.5 MG/3ML) 0.083% IN NEBU: 2.5000 mg | INHALATION_SOLUTION | RESPIRATORY_TRACT | Status: DC | PRN

## 2017-09-17 MED ORDER — DILTIAZEM HCL ER COATED BEADS 240 MG PO CP24
240.0000 mg | ORAL_CAPSULE | Freq: Every day | ORAL | Status: DC
  Administered 2017-09-18 – 2017-09-21 (×4): 240 mg via ORAL
  Filled 2017-09-17 (×4): qty 1

## 2017-09-17 MED ORDER — VITAMIN D (ERGOCALCIFEROL) 1.25 MG (50000 UNIT) PO CAPS
50000.0000 [IU] | ORAL_CAPSULE | ORAL | Status: DC
Start: 2017-10-09 — End: 2017-09-21

## 2017-09-17 MED ORDER — METOPROLOL TARTRATE 50 MG PO TABS
50.0000 mg | ORAL_TABLET | Freq: Two times a day (BID) | ORAL | Status: DC
  Administered 2017-09-17 – 2017-09-21 (×7): 50 mg via ORAL
  Filled 2017-09-17 (×8): qty 1

## 2017-09-17 MED ORDER — IPRATROPIUM-ALBUTEROL 0.5-2.5 (3) MG/3ML IN SOLN
3.0000 mL | Freq: Once | RESPIRATORY_TRACT | Status: AC
  Administered 2017-09-17: 3 mL via RESPIRATORY_TRACT
  Filled 2017-09-17: qty 3

## 2017-09-17 MED ORDER — ENOXAPARIN SODIUM 40 MG/0.4ML ~~LOC~~ SOLN: 40.0000 mg | SUBCUTANEOUS | Status: DC

## 2017-09-17 MED ORDER — METFORMIN HCL ER 500 MG PO TB24
500.0000 mg | ORAL_TABLET | Freq: Every day | ORAL | Status: DC
  Administered 2017-09-18 – 2017-09-21 (×4): 500 mg via ORAL
  Filled 2017-09-17 (×4): qty 1

## 2017-09-17 MED ORDER — ONDANSETRON HCL 4 MG PO TABS: 4.0000 mg | ORAL_TABLET | Freq: Four times a day (QID) | ORAL | Status: DC | PRN

## 2017-09-17 MED ORDER — IPRATROPIUM BROMIDE 0.02 % IN SOLN: 0.5000 mg | Freq: Four times a day (QID) | RESPIRATORY_TRACT | Status: DC

## 2017-09-17 MED ORDER — ACETAMINOPHEN 325 MG PO TABS
650.0000 mg | ORAL_TABLET | Freq: Four times a day (QID) | ORAL | Status: DC | PRN
  Administered 2017-09-17: 650 mg via ORAL
  Filled 2017-09-17: qty 2

## 2017-09-17 MED ORDER — ONDANSETRON HCL 4 MG/2ML IJ SOLN: 4.0000 mg | Freq: Four times a day (QID) | INTRAMUSCULAR | Status: DC | PRN

## 2017-09-17 MED ORDER — LORAZEPAM 2 MG/ML IJ SOLN
0.5000 mg | INTRAMUSCULAR | Status: DC | PRN
  Administered 2017-09-17: 0.5 mg via INTRAVENOUS
  Filled 2017-09-17: qty 1

## 2017-09-17 MED ORDER — ORAL CARE MOUTH RINSE
15.0000 mL | Freq: Two times a day (BID) | OROMUCOSAL | Status: DC
  Administered 2017-09-18 – 2017-09-20 (×3): 15 mL via OROMUCOSAL

## 2017-09-17 MED ORDER — METHYLPREDNISOLONE SODIUM SUCC 40 MG IJ SOLR
40.0000 mg | Freq: Four times a day (QID) | INTRAMUSCULAR | Status: AC
  Administered 2017-09-17 – 2017-09-18 (×4): 40 mg via INTRAVENOUS
  Filled 2017-09-17 (×4): qty 1

## 2017-09-17 MED ORDER — RIVAROXABAN 20 MG PO TABS
20.0000 mg | ORAL_TABLET | Freq: Every day | ORAL | Status: DC
  Administered 2017-09-17 – 2017-09-20 (×4): 20 mg via ORAL
  Filled 2017-09-17 (×4): qty 1

## 2017-09-17 MED ORDER — METHYLPREDNISOLONE SODIUM SUCC 125 MG IJ SOLR
80.0000 mg | Freq: Once | INTRAMUSCULAR | Status: AC
  Administered 2017-09-17: 80 mg via INTRAVENOUS
  Filled 2017-09-17: qty 2

## 2017-09-17 MED ORDER — ACETAMINOPHEN 650 MG RE SUPP: 650.0000 mg | Freq: Four times a day (QID) | RECTAL | Status: DC | PRN

## 2017-09-17 MED ORDER — INSULIN ASPART 100 UNIT/ML ~~LOC~~ SOLN
0.0000 [IU] | Freq: Three times a day (TID) | SUBCUTANEOUS | Status: DC
  Administered 2017-09-18: 5 [IU] via SUBCUTANEOUS
  Administered 2017-09-18: 3 [IU] via SUBCUTANEOUS
  Administered 2017-09-18: 15 [IU] via SUBCUTANEOUS
  Administered 2017-09-19: 5 [IU] via SUBCUTANEOUS
  Administered 2017-09-19: 3 [IU] via SUBCUTANEOUS
  Administered 2017-09-20: 11 [IU] via SUBCUTANEOUS
  Administered 2017-09-20: 8 [IU] via SUBCUTANEOUS
  Administered 2017-09-21: 5 [IU] via SUBCUTANEOUS
  Administered 2017-09-21: 2 [IU] via SUBCUTANEOUS

## 2017-09-17 MED ORDER — MAGNESIUM SULFATE 2 GM/50ML IV SOLN
2.0000 g | Freq: Once | INTRAVENOUS | Status: AC
  Administered 2017-09-17: 2 g via INTRAVENOUS
  Filled 2017-09-17: qty 50

## 2017-09-17 MED ORDER — IPRATROPIUM-ALBUTEROL 0.5-2.5 (3) MG/3ML IN SOLN
3.0000 mL | Freq: Four times a day (QID) | RESPIRATORY_TRACT | Status: DC
  Administered 2017-09-17 – 2017-09-18 (×5): 3 mL via RESPIRATORY_TRACT
  Filled 2017-09-17 (×5): qty 3

## 2017-09-17 MED ORDER — K PHOS MONO-SOD PHOS DI & MONO 155-852-130 MG PO TABS
500.0000 mg | ORAL_TABLET | Freq: Three times a day (TID) | ORAL | Status: AC
  Administered 2017-09-17 – 2017-09-18 (×3): 500 mg via ORAL
  Filled 2017-09-17 (×6): qty 2

## 2017-09-17 MED ORDER — ALBUTEROL SULFATE (2.5 MG/3ML) 0.083% IN NEBU: 2.5000 mg | INHALATION_SOLUTION | Freq: Four times a day (QID) | RESPIRATORY_TRACT | Status: DC

## 2017-09-17 NOTE — ED Triage Notes (Signed)
PT c/o worsening in SOB x3 days. PT states she is on 2.5L of oxygen continuously at home. PT 80% on 3L of oxygen upon ED arrival. Pt states she went to her PCP office today and was sent to ED for evaluation due to low oxygen levels.

## 2017-09-17 NOTE — ED Notes (Signed)
Pt turned up to 4L [place]. edp in room with pt at present

## 2017-09-17 NOTE — Patient Outreach (Signed)
[facility]) Care Management  09/17/2017  Eileen [name] 1946-02-18 [id number]  Referral via [facility] EM[facility]-COPD -Day#11, 09/16/2017: Reason-Lost interest in things they use to do-yes Sad, anxious-yes  Telephone call to patient; left message on voice mail requesting call back.  Plan: Geophysicist/field seismologist. Follow up 2-4 days.  Sherrin Daisy, RN BSN [place] Management Coordinator [facility] Care Management  [phone]

## 2017-09-17 NOTE — ED Provider Notes (Signed)
[facility] EMERGENCY DEPARTMENT Provider Note   CSN: [id number] Arrival date & time: 09/17/17  1558     History   Chief Complaint Chief Complaint  Patient presents with  . Shortness of Breath    HPI Eileen [name] is a 72 y.o. female.  Patient with known history of COPD presents with worsening dyspnea.  She is normally on 2 L of oxygen at home but has recently bumped it up to 3 L.  Patient was seen by her primary care doctor today and referred to the emergency department.  She is currently on prednisone 10 mg daily.  She can only walk a few steps before she becomes dyspneic.  No substernal chest pain, fever, sweats, chills.  Severity is moderate.  Pulse ox here has been in the 70s and 80s.     Past Medical History:  Diagnosis Date  . Asthma   . COPD (chronic obstructive pulmonary disease) ([place])   . History of breast cancer    right breast  . Hypertension     Patient Active Problem List   Diagnosis Date Noted  . Acute respiratory failure with hypoxia ([place]) 09/17/2017  . Diabetes mellitus ([place]) 08/31/2017  . Acute hypoxemic respiratory failure ([place]) 08/31/2017  . Atrial fibrillation with RVR ([place]) 09/24/2016  . Atrial flutter ([place]) 09/23/2016  . Acute on chronic respiratory failure ([place]) 03/24/2015  . Metabolic encephalopathy 19/37/9024  . HCAP (healthcare-associated pneumonia) 03/24/2015  . Essential hypertension   . Anticoagulation management encounter   . New onset atrial flutter ([place]) 03/21/2015  . Obesity 03/21/2015  . Atrial flutter with rapid ventricular response ([place]) 03/21/2015  . COPD (chronic obstructive pulmonary disease) ([place]) 12/20/2014  . COPD exacerbation ([place]) 12/20/2014  . DCIS (ductal carcinoma in situ) of breast, right, 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, Arimadex 03/10/2011    Past Surgical History:  Procedure Laterality Date  . CESAREAN SECTION    . HERNIA REPAIR     RIH  . MASTECTOMY  2011   right breast     OB History    Gravida      Para      Term      Preterm      AB      Living  5     SAB      TAB      Ectopic      Multiple      Live Births               Home Medications    Prior to Admission medications   Medication Sig Start Date End Date Taking? Authorizing Provider  acetaminophen (TYLENOL) 500 MG tablet Take 500 mg by mouth every other day.    [provider]  albuterol (PROVENTIL) (2.5 MG/3ML) 0.083% nebulizer solution Take 2.5 mg by nebulization every 6 (six) hours as needed for wheezing or shortness of breath.    [provider]  Ascorbic Acid (VITAMIN C PO) Take 1 tablet by mouth daily.    [provider]  blood glucose meter kit and supplies KIT Dispense based on patient and insurance preference. Use up to four times daily as directed. (FOR ICD-9 250.00, 250.01).  Check sugar before meals and at night, keep a log. 09/29/16   Arrien, Jimmy Picket, MD  diltiazem (CARDIZEM CD) 240 MG 24 hr capsule TAKE ONE CAPSULE BY MOUTH DAILY. 04/03/17   Herminio Commons, MD  metFORMIN (GLUCOPHAGE-XR) 500 MG 24 hr tablet Take 500 mg by mouth  daily. 08/10/17   [provider]  metoprolol tartrate (LOPRESSOR) 50 MG tablet Take 1 tablet (50 mg total) by mouth 2 (two) times daily. 09/29/16   Arrien, Jimmy Picket, MD  Multiple Vitamins-Minerals (EMERGEN-C VITAMIN C PO) Take 1 Package by mouth daily.    [provider]  OXYGEN Inhale 2.5-3 L into the lungs daily. At night and daily as needed    [provider]  predniSONE (DELTASONE) 10 MG tablet Take 10 mg by mouth daily with breakfast.    [provider]  predniSONE (DELTASONE) 10 MG tablet Take 6 tablets (60 mg total) by mouth daily with breakfast. And decrease by one tablet daily 09/05/17   Tat, Shanon Brow, MD  PROAIR HFA 108 (90 BASE) MCG/ACT inhaler Inhale 2 puffs into the lungs every 6 (six) hours as needed for wheezing or shortness of breath.  03/06/11   [provider]  rosuvastatin  (CRESTOR) 10 MG tablet Take 10 mg by mouth every evening. 01/08/17   [provider]  TRELEGY ELLIPTA 100-62.5-25 MCG/INH AEPB Take 1 puff by mouth daily. 08/27/17   [provider]  Vitamin D, Ergocalciferol, (DRISDOL) 50000 units CAPS capsule Take 50,000 Units every 30 (thirty) days by mouth.    [provider]  XARELTO 20 MG TABS tablet TAKE ONE TABLET BY MOUTH DAILY WITH SUPPER. 05/02/17   Herminio Commons, MD    Family History Family History  Problem Relation Age of Onset  . Cancer Mother        lung  . Diabetes Brother     Social History Social History   Tobacco Use  . Smoking status: Former Smoker    Packs/day: 0.50    Years: 32.00    Pack years: 16.00    Types: Cigarettes    Start date: 12/12/1962    Last attempt to quit: 03/13/1994    Years since quitting: 23.5  . Smokeless tobacco: Never Used  Substance Use Topics  . Alcohol use: No    Alcohol/week: 0.0 oz  . Drug use: No     Allergies   Codeine   Review of Systems Review of Systems  All other systems reviewed and are negative.    Physical Exam Updated Vital Signs BP 111/65   Pulse 92   Temp 98.2 F (36.8 C) (Oral)   Resp (!) 22   Ht '5\' 4"'$  (1.626 m)   Wt 80.3 kg (177 lb)   SpO2 (!) 85%   BMI 30.38 kg/m   Physical Exam  Constitutional: She is oriented to person, place, and time.  On nasal oxygen, slight tachypnea, no respiratory extremis  HENT:  Head: Normocephalic and atraumatic.  Eyes: Conjunctivae are normal.  Neck: Neck supple.  Cardiovascular: Normal rate and regular rhythm.  Pulmonary/Chest:  Bilateral expiratory wheezes.  Abdominal: Soft. Bowel sounds are normal.  Musculoskeletal: Normal range of motion.  Neurological: She is alert and oriented to person, place, and time.  Skin: Skin is warm and dry.  Psychiatric: She has a normal mood and affect. Her behavior is normal.  Nursing note and vitals reviewed.    ED Treatments / Results  Labs (all labs  ordered are listed, but only abnormal results are displayed) Labs Reviewed  CBC WITH DIFFERENTIAL/PLATELET - Abnormal; Notable for the following components:      Result Value   WBC 17.0 (*)    Neutro Abs 14.8 (*)    All other components within normal limits  BASIC METABOLIC PANEL - Abnormal; Notable  for the following components:   Chloride 95 (*)    CO2 34 (*)    Glucose, Bld 234 (*)    All other components within normal limits  TROPONIN I  D-DIMER, QUANTITATIVE (NOT AT [facility])    EKG None  Radiology Dg Chest 2 View  Result Date: 09/17/2017 CLINICAL DATA:  Worsening dyspnea EXAM: CHEST - 2 VIEW COMPARISON:  08/31/2017 chest radiograph. FINDINGS: Stable cardiomediastinal silhouette with top-normal heart size. No pneumothorax. No pleural effusion. Emphysema. Hyperinflated lungs. No overt pulmonary edema. Stable linear reticular markings at the lung bases. No acute consolidative airspace disease. IMPRESSION: 1. No acute cardiopulmonary disease. 2. Emphysema and hyperinflated lungs, suggesting COPD. 3. Stable linear reticular markings at the lung bases suggesting nonspecific scarring or interstitial lung disease. Electronically Signed   By: Ilona Sorrel M.D.   On: 09/17/2017 17:04    Procedures Procedures (including critical care time)  Medications Ordered in ED Medications  ipratropium-albuterol (DUONEB) 0.5-2.5 (3) MG/3ML nebulizer solution 3 mL (3 mLs Nebulization Given 09/17/17 1644)  methylPREDNISolone sodium succinate (SOLU-MEDROL) 125 mg/2 mL injection 80 mg (80 mg Intravenous Given 09/17/17 1639)     Initial Impression / Assessment and Plan / ED Course  I have reviewed the triage vital signs and the nursing notes.  Pertinent labs & imaging results that were available during my care of the patient were reviewed by me and considered in my medical decision making (see chart for details).     Patient with known COPD presents with exacerbation of same.  Her pulse ox was initially in  the 70s.  It has improved to the 80s with a nebulizer treatment and IV steroids.  Will admit for further treatment.  Final Clinical Impressions(s) / ED Diagnoses   Final diagnoses:  COPD exacerbation [facility])  Hypoxia    ED Discharge Orders    None       Nat Christen, MD 09/17/17 1911

## 2017-09-17 NOTE — ED Notes (Signed)
Report given to Allison, RN

## 2017-09-17 NOTE — ED Notes (Signed)
Pt received tx, not transported to xray

## 2017-09-17 NOTE — ED Notes (Signed)
Pt returned from xray. No changes.

## 2017-09-17 NOTE — H&P (Signed)
History and Physical    Eileen [name] OIN:[id number] DOB: Aug 04, 1945 DOA: 09/17/2017  PCP: Lavella Lemons, PA   Patient coming from: Home.  I have personally briefly reviewed patient's old medical records in [place]  Chief Complaint: Dyspnea.  HPI: Eileen [name] is a 72 y.o. female with medical history significant of asthma/COPD, atrial flutter, history of right breast cancer, hypertension, type 2 diabetes who is coming to the emergency department with complaints of progressively worse dyspnea associated with wheezing for the past 2 weeks.  She denies fever, chills, earache, rhinorrhea or sore throat.  No chest pain, palpitations, dizziness, diaphoresis, PND, orthopnea or pitting edema lower extremities.  Denies abdominal pain, nausea, emesis, diarrhea or constipation.  No melena or hematochezia.  No dysuria, frequency or hematuria.  Denies skin rashes.  No polyuria, polydipsia or blurred vision.  No heat or cold intolerance.  ED Course: Initial vital signs temperature 98.2 F, pulse 99, respiration 20, blood pressure 129/69 mmHg and O2 sat 80% on nasal cannula oxygen.  Lab work White count 17.0 with 87% neutrophils. Hemoglobin 13.7 g/dL and platelets 285.  D-dimer less than 0.27.  Troponin was normal.  Sodium 139, potassium 4.4, CO2 34 mmol/L.  BUN was 16, creatinine 0.86 and glucose 234 mg/dL.  Her chest radiograph showed emphysema with hyperinflated lungs, but no acute cardiopulmonary disease was seen.  Please see image for further detail.  Review of Systems: As per HPI otherwise 10 point review of systems negative.    Past Medical History:  Diagnosis Date  . Marland Kitchen Asthma Atrial flutter   . COPD (chronic obstructive pulmonary disease) ([place])   . History of breast cancer    right breast  . . Hypertension Type 2 diabetes mellitus ([place])     Past Surgical History:  Procedure Laterality Date  . CESAREAN SECTION    . HERNIA REPAIR     RIH  . MASTECTOMY  2011   right  breast     reports that she quit smoking about 23 years ago. Her smoking use included cigarettes. She started smoking about 54 years ago. She has a 16.00 pack-year smoking history. She has never used smokeless tobacco. She reports that she does not drink alcohol or use drugs.  Allergies  Allergen Reactions  . Codeine Other (See Comments)    "jittery"    Family History  Problem Relation Age of Onset  . Cancer Mother        lung  . Diabetes Brother     Prior to Admission medications   Medication Sig Start Date End Date Taking? Authorizing Provider  acetaminophen (TYLENOL) 500 MG tablet Take 500 mg by mouth every other day.   Yes [provider]  albuterol (PROVENTIL) (2.5 MG/3ML) 0.083% nebulizer solution Take 2.5 mg by nebulization every 6 (six) hours as needed for wheezing or shortness of breath.   Yes [provider]  Ascorbic Acid (VITAMIN C PO) Take 1 tablet by mouth daily.   Yes [provider]  diltiazem (CARDIZEM CD) 240 MG 24 hr capsule TAKE ONE CAPSULE BY MOUTH DAILY. 04/03/17  Yes Herminio Commons, MD  metFORMIN (GLUCOPHAGE-XR) 500 MG 24 hr tablet Take 500 mg by mouth daily. 08/10/17  Yes [provider]  metoprolol tartrate (LOPRESSOR) 50 MG tablet Take 1 tablet (50 mg total) by mouth 2 (two) times daily. 09/29/16  Yes Arrien, Jimmy Picket, MD  Multiple Vitamins-Minerals (EMERGEN-C VITAMIN C PO) Take 1 Package by mouth daily.  Yes [provider]  OXYGEN Inhale 2.5-3 L into the lungs daily. At night and daily as needed   Yes [provider]  predniSONE (DELTASONE) 10 MG tablet Take 10 mg by mouth daily with breakfast.   Yes [provider]  PROAIR HFA 108 (90 BASE) MCG/ACT inhaler Inhale 2 puffs into the lungs every 6 (six) hours as needed for wheezing or shortness of breath.  03/06/11  Yes [provider]  TRELEGY ELLIPTA 100-62.5-25 MCG/INH AEPB Take 1 puff by mouth daily. 08/27/17  Yes [provider]  Vitamin D, Ergocalciferol, (DRISDOL) 50000 units CAPS capsule Take 50,000 Units every 30 (thirty) days by mouth.   Yes [provider]  XARELTO 20 MG TABS tablet TAKE ONE TABLET BY MOUTH DAILY WITH SUPPER. 05/02/17  Yes Herminio Commons, MD  blood glucose meter kit and supplies KIT Dispense based on patient and insurance preference. Use up to four times daily as directed. (FOR ICD-9 250.00, 250.01).  Check sugar before meals and at night, keep a log. 09/29/16   Arrien, Jimmy Picket, MD  predniSONE (DELTASONE) 10 MG tablet Take 6 tablets (60 mg total) by mouth daily with breakfast. And decrease by one tablet daily Patient not taking: Reported on 09/17/2017 09/05/17   Orson Eva, MD    Physical Exam: Vitals:   09/17/17 1930 09/17/17 1951 09/17/17 2002 09/17/17 2049  BP: 128/82  121/79   Pulse: 94  90   Resp: 20  20   Temp:    98 F (36.7 C)  TempSrc:    Oral  SpO2: (!) 81% (!) 87% 92%   Weight:    80.6 kg (177 lb 11.1 oz)  Height:    5' 2"  (1.575 m)    Constitutional: NAD, calm, comfortable Eyes: PERRL, lids and conjunctivae normal ENMT: Mucous membranes are moist. Posterior pharynx clear of any exudate or lesions. Neck: normal, supple, no masses, no thyromegaly Respiratory: Decreased breath sounds bilaterally with wheezing and mild rhonchi, no crackles.  Mildly tachypneic. No accessory muscle use.  Cardiovascular: Regular rate and rhythm, no murmurs / rubs / gallops. No extremity edema. 2+ pedal pulses. No carotid bruits.  Abdomen: Soft, no tenderness, no masses palpated. No hepatosplenomegaly. Bowel sounds positive.  Musculoskeletal: no clubbing / cyanosis.  Good ROM, no contractures. Normal muscle tone.  Skin: no rashes, lesions, ulcers on limited dermatological examination. Neurologic: CN 2-12 grossly intact. Sensation intact, DTR normal. Strength 5/5 in all 4.  Psychiatric: Normal judgment and insight. Alert and oriented x 3. Normal mood.    Labs on  Admission: I have personally reviewed following labs and imaging studies  CBC: Recent Labs  Lab 09/17/17 1628  WBC 17.0*  NEUTROABS 14.8*  HGB 13.7  HCT 44.2  MCV 97.6  PLT 672   Basic Metabolic Panel: Recent Labs  Lab 09/17/17 1628 09/17/17 1927  NA 139  --   K 4.4  --   CL 95*  --   CO2 34*  --   GLUCOSE 234*  --   BUN 16  --   CREATININE 0.86  --   CALCIUM 9.6  --   MG  --  1.8  PHOS  --  2.3*   GFR: Estimated Creatinine Clearance: 58.2 mL/min (by C-G formula based on SCr of 0.86 mg/dL). Liver Function Tests: Recent Labs  Lab 09/17/17 1927  AST 19  ALT 18  ALKPHOS 56  BILITOT 0.6  PROT 7.7  ALBUMIN 4.2   No results for  input(s): LIPASE, AMYLASE in the last 168 hours. No results for input(s): AMMONIA in the last 168 hours. Coagulation Profile: No results for input(s): INR, PROTIME in the last 168 hours. Cardiac Enzymes: Recent Labs  Lab 09/17/17 1628  TROPONINI <0.03   BNP (last 3 results) No results for input(s): PROBNP in the last 8760 hours. HbA1C: No results for input(s): HGBA1C in the last 72 hours. CBG: No results for input(s): GLUCAP in the last 168 hours. Lipid Profile: No results for input(s): CHOL, HDL, LDLCALC, TRIG, CHOLHDL, LDLDIRECT in the last 72 hours. Thyroid Function Tests: No results for input(s): TSH, T4TOTAL, FREET4, T3FREE, THYROIDAB in the last 72 hours. Anemia Panel: No results for input(s): VITAMINB12, FOLATE, FERRITIN, TIBC, IRON, RETICCTPCT in the last 72 hours. Urine analysis:    Component Value Date/Time   COLORURINE YELLOW 09/09/2009 1010   APPEARANCEUR HAZY (A) 09/09/2009 1010   LABSPEC 1.030 09/09/2009 1010   PHURINE 5.5 09/09/2009 1010   GLUCOSEU NEGATIVE 09/09/2009 1010   HGBUR NEGATIVE 09/09/2009 1010   BILIRUBINUR NEGATIVE 09/09/2009 1010   KETONESUR NEGATIVE 09/09/2009 1010   PROTEINUR NEGATIVE 09/09/2009 1010   UROBILINOGEN 0.2 09/09/2009 1010   NITRITE NEGATIVE 09/09/2009 1010   LEUKOCYTESUR   09/09/2009 1010    NEGATIVE MICROSCOPIC NOT DONE ON URINES WITH NEGATIVE PROTEIN, BLOOD, LEUKOCYTES, NITRITE, OR GLUCOSE <1000 mg/dL.    Radiological Exams on Admission: Dg Chest 2 View  Result Date: 09/17/2017 CLINICAL DATA:  Worsening dyspnea EXAM: CHEST - 2 VIEW COMPARISON:  08/31/2017 chest radiograph. FINDINGS: Stable cardiomediastinal silhouette with top-normal heart size. No pneumothorax. No pleural effusion. Emphysema. Hyperinflated lungs. No overt pulmonary edema. Stable linear reticular markings at the lung bases. No acute consolidative airspace disease. IMPRESSION: 1. No acute cardiopulmonary disease. 2. Emphysema and hyperinflated lungs, suggesting COPD. 3. Stable linear reticular markings at the lung bases suggesting nonspecific scarring or interstitial lung disease. Electronically Signed   By: Ilona Sorrel M.D.   On: 09/17/2017 17:04    EKG: Independently reviewed.  None.  Assessment/Plan Principal Problem:   Acute respiratory failure with hypoxia (HCC) Admit to stepdown/inpatient. Supplemental oxygen. BiPAP ventilation nightly and as needed. COPD exacerbation treatment as below.  Active Problems:   COPD exacerbation (HCC) Continue supplemental oxygen. Continue as scheduled and as needed bronchodilators. Solu-Medrol 125 mg IVP x1, then 40 mg IVP every 6 hours.    Essential hypertension Continue metoprolol 50 mg p.o. twice daily. Continue Cardizem 240 mg p.o. daily. Monitor blood pressure and heart rate.    Atrial flutter (HCC) CHA?DS?-VASc Score of at least 4. Continue Cardizem and metoprolol for rate control. Continue Xarelto.    Diabetes mellitus (HCC) Carb modified diet. Continue metformin 500 mg p.o. daily. CBG monitoring with regular insulin sliding scale while receiving glucocorticoids.   DVT prophylaxis: On Xarelto. Code Status: Full code. Family Communication:  Disposition Plan: Admit for further evaluation and treatment of acute respiratory  failure. Consults called:  Admission status: Inpatient/stepdown.   Reubin Milan MD [facility] Pager [phone].  If 7PM-7AM, please contact night-coverage [email or web address] Password [email or web address]  09/17/2017, 8:59 PM

## 2017-09-18 DIAGNOSIS — E118 Type 2 diabetes mellitus with unspecified complications: Secondary | ICD-10-CM

## 2017-09-18 DIAGNOSIS — J441 Chronic obstructive pulmonary disease with (acute) exacerbation: Principal | ICD-10-CM

## 2017-09-18 DIAGNOSIS — J9621 Acute and chronic respiratory failure with hypoxia: Secondary | ICD-10-CM

## 2017-09-18 DIAGNOSIS — J9622 Acute and chronic respiratory failure with hypercapnia: Secondary | ICD-10-CM

## 2017-09-18 DIAGNOSIS — I10 Essential (primary) hypertension: Secondary | ICD-10-CM

## 2017-09-18 DIAGNOSIS — I4892 Unspecified atrial flutter: Secondary | ICD-10-CM

## 2017-09-18 LAB — CBC WITH DIFFERENTIAL/PLATELET
BASOS ABS: 0 10*3/uL (ref 0.0–0.1)
BASOS PCT: 0 %
EOS ABS: 0 10*3/uL (ref 0.0–0.7)
Eosinophils Relative: 0 %
HEMATOCRIT: 42.3 % (ref 36.0–46.0)
Hemoglobin: 13.1 g/dL (ref 12.0–15.0)
Lymphocytes Relative: 8 %
Lymphs Abs: 1 10*3/uL (ref 0.7–4.0)
MCH: 30.6 pg (ref 26.0–34.0)
MCHC: 31 g/dL (ref 30.0–36.0)
MCV: 98.8 fL (ref 78.0–100.0)
Monocytes Absolute: 0.2 10*3/uL (ref 0.1–1.0)
Monocytes Relative: 1 %
NEUTROS ABS: 11.7 10*3/uL — AB (ref 1.7–7.7)
Neutrophils Relative %: 91 %
Platelets: 266 10*3/uL (ref 150–400)
RBC: 4.28 MIL/uL (ref 3.87–5.11)
RDW: 13.9 % (ref 11.5–15.5)
WBC: 12.8 10*3/uL — AB (ref 4.0–10.5)

## 2017-09-18 LAB — GLUCOSE, CAPILLARY
GLUCOSE-CAPILLARY: 226 mg/dL — AB (ref 70–99)
Glucose-Capillary: 246 mg/dL — ABNORMAL HIGH (ref 70–99)
Glucose-Capillary: 355 mg/dL — ABNORMAL HIGH (ref 70–99)
Glucose-Capillary: 181 mg/dL — ABNORMAL HIGH (ref 70–99)

## 2017-09-18 LAB — BASIC METABOLIC PANEL
ANION GAP: 7 (ref 5–15)
BUN: 16 mg/dL (ref 8–23)
CHLORIDE: 98 mmol/L (ref 98–111)
CO2: 36 mmol/L — AB (ref 22–32)
CREATININE: 0.72 mg/dL (ref 0.44–1.00)
Calcium: 9.2 mg/dL (ref 8.9–10.3)
GFR calc non Af Amer: >60 mL/min (ref >60)
Glucose, Bld: 315 mg/dL — ABNORMAL HIGH (ref 70–99)
POTASSIUM: 5.3 mmol/L — AB (ref 3.5–5.1)
SODIUM: 141 mmol/L (ref 135–145)

## 2017-09-18 LAB — MRSA PCR SCREENING: MRSA BY PCR: NEGATIVE

## 2017-09-18 MED ORDER — GUAIFENESIN ER 600 MG PO TB12
1200.0000 mg | ORAL_TABLET | Freq: Two times a day (BID) | ORAL | Status: DC
  Administered 2017-09-18 – 2017-09-21 (×7): 1200 mg via ORAL
  Filled 2017-09-18 (×7): qty 2

## 2017-09-18 MED ORDER — IPRATROPIUM-ALBUTEROL 0.5-2.5 (3) MG/3ML IN SOLN
3.0000 mL | Freq: Three times a day (TID) | RESPIRATORY_TRACT | Status: DC
  Administered 2017-09-19 – 2017-09-21 (×8): 3 mL via RESPIRATORY_TRACT
  Filled 2017-09-18 (×8): qty 3

## 2017-09-18 MED ORDER — INSULIN GLARGINE 100 UNIT/ML ~~LOC~~ SOLN
10.0000 [IU] | Freq: Every day | SUBCUTANEOUS | Status: DC
  Administered 2017-09-18 – 2017-09-21 (×4): 10 [IU] via SUBCUTANEOUS
  Filled 2017-09-18 (×8): qty 0.1

## 2017-09-18 NOTE — Progress Notes (Signed)
PROGRESS NOTE    Eileen [name]  NFA:[id number] DOB: April 01, 1945 DOA: 09/17/2017 PCP: Lavella Lemons, PA    Brief Narrative:  72 year old female who has advanced COPD, oxygen dependent and uses BiPAP nightly, was recently admitted to the hospital for COPD exacerbation.  She comes back to the hospital with worsening shortness of breath and found to have recurrent COPD exacerbation.  Admitted to the hospital on BiPAP, requiring intravenous steroids and bronchodilators.  She is slowly improving.   Assessment & Plan:   Active Problems:   COPD exacerbation ([place])   Essential hypertension   Acute on chronic respiratory failure (HCC)   Atrial flutter ([place])   Diabetes mellitus ([place])   1. Acute on chronic respiratory failure.  Patient is chronically on oxygen and uses BiPAP nightly.  She has advanced COPD.  Currently requiring high flow nasal cannula at 10 L.  We will try to wean down oxygen as tolerated. 2. COPD exacerbation.  Recent admission for the same.  Currently on IV steroids.  Continue bronchodilators and pulmonary hygiene.  Appears to be slowly improving. 3. Diabetes.  Blood sugars are uncontrolled, likely precipitated by intravenous steroids.  Will start low-dose Lantus.  Continue metformin and sliding scale insulin. 4. Atrial flutter.  Heart rate is currently controlled.  Continue on metoprolol and Cardizem.  Anticoagulated with Xarelto. 5. Hypertension.  Blood pressures currently stable.   DVT prophylaxis: Xarelto Code Status: Full code Family Communication: No family present Disposition Plan: Discharge home once improved.   Consultants:     Procedures:     Antimicrobials:       Subjective: She is feeling better today.  Shortness of breath improving.  Wheezing is better.  Still requiring more oxygen than her baseline.  Objective: Vitals:   09/18/17 0400 09/18/17 0434 09/18/17 0500 09/18/17 0802  BP: 92/71 92/71 97/66    Pulse: 73 72 71   Resp: 14 20 (!) 25     Temp: 98 F (36.7 C)   97.9 F (36.6 C)  TempSrc: Axillary   Axillary  SpO2: 95% 95% 95% 93%  Weight:   80.6 kg (177 lb 11.1 oz)   Height:        Intake/Output Summary (Last 24 hours) at 09/18/2017 1259 Last data filed at 09/17/2017 2300 Gross per 24 hour  Intake 240 ml  Output 200 ml  Net 40 ml   Filed Weights   09/17/17 1607 09/17/17 2049 09/18/17 0500  Weight: 80.3 kg (177 lb) 80.6 kg (177 lb 11.1 oz) 80.6 kg (177 lb 11.1 oz)    Examination:  General exam: Appears calm and comfortable  Respiratory system: Diminished breath sounds bilaterally. Respiratory effort normal. Cardiovascular system: S1 & S2 heard, RRR. No JVD, murmurs, rubs, gallops or clicks. No pedal edema. Gastrointestinal system: Abdomen is nondistended, soft and nontender. No organomegaly or masses felt. Normal bowel sounds heard. Central nervous system: Alert and oriented. No focal neurological deficits. Extremities: Symmetric 5 x 5 power. Skin: No rashes, lesions or ulcers Psychiatry: Judgement and insight appear normal. Mood & affect appropriate.     Data Reviewed: I have personally reviewed following labs and imaging studies  CBC: Recent Labs  Lab 09/17/17 1628 09/18/17 0359  WBC 17.0* 12.8*  NEUTROABS 14.8* 11.7*  HGB 13.7 13.1  HCT 44.2 42.3  MCV 97.6 98.8  PLT 285 469   Basic Metabolic Panel: Recent Labs  Lab 09/17/17 1628 09/17/17 1927 09/18/17 0359  NA 139  --  141  K 4.4  --  5.3*  CL 95*  --  98  CO2 34*  --  36*  GLUCOSE 234*  --  315*  BUN 16  --  16  CREATININE 0.86  --  0.72  CALCIUM 9.6  --  9.2  MG  --  1.8  --   PHOS  --  2.3*  --    GFR: Estimated Creatinine Clearance: 62.5 mL/min (by C-G formula based on SCr of 0.72 mg/dL). Liver Function Tests: Recent Labs  Lab 09/17/17 1927  AST 19  ALT 18  ALKPHOS 56  BILITOT 0.6  PROT 7.7  ALBUMIN 4.2   No results for input(s): LIPASE, AMYLASE in the last 168 hours. No results for input(s): AMMONIA in the last 168  hours. Coagulation Profile: No results for input(s): INR, PROTIME in the last 168 hours. Cardiac Enzymes: Recent Labs  Lab 09/17/17 1628  TROPONINI <0.03   BNP (last 3 results) No results for input(s): PROBNP in the last 8760 hours. HbA1C: No results for input(s): HGBA1C in the last 72 hours. CBG: Recent Labs  Lab 09/17/17 2128 09/18/17 0734 09/18/17 1143  GLUCAP 267* 246* 355*   Lipid Profile: No results for input(s): CHOL, HDL, LDLCALC, TRIG, CHOLHDL, LDLDIRECT in the last 72 hours. Thyroid Function Tests: No results for input(s): TSH, T4TOTAL, FREET4, T3FREE, THYROIDAB in the last 72 hours. Anemia Panel: No results for input(s): VITAMINB12, FOLATE, FERRITIN, TIBC, IRON, RETICCTPCT in the last 72 hours. Sepsis Labs: No results for input(s): PROCALCITON, LATICACIDVEN in the last 168 hours.  Recent Results (from the past 240 hour(s))  MRSA PCR Screening     Status: None   Collection Time: 09/17/17  8:41 PM  Result Value Ref Range Status   MRSA by PCR NEGATIVE NEGATIVE Final    Comment:        The GeneXpert MRSA Assay (FDA approved for NASAL specimens only), is one component of a comprehensive MRSA colonization surveillance program. It is not intended to diagnose MRSA infection nor to guide or monitor treatment for MRSA infections. Performed at [facility], [address], [place], [place] [address]          Radiology Studies: Dg Chest 2 View  Result Date: 09/17/2017 CLINICAL DATA:  Worsening dyspnea EXAM: CHEST - 2 VIEW COMPARISON:  08/31/2017 chest radiograph. FINDINGS: Stable cardiomediastinal silhouette with top-normal heart size. No pneumothorax. No pleural effusion. Emphysema. Hyperinflated lungs. No overt pulmonary edema. Stable linear reticular markings at the lung bases. No acute consolidative airspace disease. IMPRESSION: 1. No acute cardiopulmonary disease. 2. Emphysema and hyperinflated lungs, suggesting COPD. 3. Stable linear reticular markings at  the lung bases suggesting nonspecific scarring or interstitial lung disease. Electronically Signed   By: Ilona Sorrel M.D.   On: 09/17/2017 17:04        Scheduled Meds: . diltiazem  240 mg Oral Daily  . guaiFENesin  1,200 mg Oral BID  . insulin aspart  0-15 Units Subcutaneous TID WC  . insulin glargine  10 Units Subcutaneous Daily  . ipratropium-albuterol  3 mL Nebulization Q6H  . mouth rinse  15 mL Mouth Rinse BID  . metFORMIN  500 mg Oral Q breakfast  . methylPREDNISolone (SOLU-MEDROL) injection  40 mg Intravenous Q6H  . metoprolol tartrate  50 mg Oral BID  . phosphorus  500 mg Oral TID  . rivaroxaban  20 mg Oral Q supper  . [START ON 10/09/2017] Vitamin D (Ergocalciferol)  50,000 Units Oral Q30 days   Continuous Infusions:   LOS: 1  day    Time spent: 30 minutes    Kathie Dike, MD [facility] Pager [phone]  If 7PM-7AM, please contact night-coverage [email or web address] Password [email or web address] 09/18/2017, 12:59 PM

## 2017-09-18 NOTE — Progress Notes (Signed)
Inpatient Diabetes Program Recommendations  AACE/ADA: New Consensus Statement on Inpatient Glycemic Control (2019)  Target Ranges:  Prepandial:   less than 140 mg/dL      [place] postprandial:   less than 180 mg/dL (1-2 hours)      Critically ill patients:  140 - 180 mg/dL  Results for Deines, [name] (MRN [id number]) as of 09/18/2017 07:16  Ref. Range 09/17/2017 16:28 09/18/2017 03:59  Glucose Latest Ref Range: 70 - 99 mg/dL 234 (H) 315 (H)   Results for Magana, [name] (MRN [id number]) as of 09/18/2017 07:16  Ref. Range 09/17/2017 21:28  Glucose-Capillary Latest Ref Range: 70 - 99 mg/dL 267 (H)    Review of Glycemic Control  Diabetes history: DM2 Outpatient Diabetes medications: Metformin XR 500 mg daily Current orders for Inpatient glycemic control: Novolog 0-15 units TID with meals; Solumedrol 40 mg Q6H  Inpatient Diabetes Program Recommendations: Insulin - Basal: If steroids are continued as ordered, please consider ordering Lantus 8 units Q24H. Correction (SSI): Please consider ordering Novolog 0-5 units QHS for bedtime correction scale.  Thanks, Barnie Alderman, RN, MSN, CDE Diabetes Coordinator Inpatient Diabetes Program [phone] (Team Pager from 8am to 5pm)

## 2017-09-18 NOTE — Progress Notes (Signed)
Report given to [place], RN on 300. Pt adv of room change to Room 313- pt stated she would call her family and make them aware.

## 2017-09-19 LAB — CBC WITH DIFFERENTIAL/PLATELET
BASOS ABS: 0 10*3/uL (ref 0.0–0.1)
BASOS PCT: 0 %
EOS ABS: 0 10*3/uL (ref 0.0–0.7)
Eosinophils Relative: 0 %
HEMATOCRIT: 41.9 % (ref 36.0–46.0)
HEMOGLOBIN: 12.2 g/dL (ref 12.0–15.0)
Lymphocytes Relative: 5 %
Lymphs Abs: 1 10*3/uL (ref 0.7–4.0)
MCH: 29.3 pg (ref 26.0–34.0)
MCHC: 29.1 g/dL — AB (ref 30.0–36.0)
MCV: 100.5 fL — ABNORMAL HIGH (ref 78.0–100.0)
MONO ABS: 0.7 10*3/uL (ref 0.1–1.0)
MONOS PCT: 4 %
NEUTROS ABS: 17.6 10*3/uL — AB (ref 1.7–7.7)
Neutrophils Relative %: 91 %
Platelets: 280 10*3/uL (ref 150–400)
RBC: 4.17 MIL/uL (ref 3.87–5.11)
RDW: 13.6 % (ref 11.5–15.5)
WBC: 19.3 10*3/uL — ABNORMAL HIGH (ref 4.0–10.5)

## 2017-09-19 LAB — BASIC METABOLIC PANEL
ANION GAP: 9 (ref 5–15)
BUN: 20 mg/dL (ref 8–23)
CALCIUM: 9.2 mg/dL (ref 8.9–10.3)
CO2: 39 mmol/L — AB (ref 22–32)
CREATININE: 0.68 mg/dL (ref 0.44–1.00)
Chloride: 93 mmol/L — ABNORMAL LOW (ref 98–111)
GFR calc non Af Amer: >60 mL/min (ref >60)
Glucose, Bld: 185 mg/dL — ABNORMAL HIGH (ref 70–99)
Potassium: 5.2 mmol/L — ABNORMAL HIGH (ref 3.5–5.1)
SODIUM: 141 mmol/L (ref 135–145)

## 2017-09-19 LAB — GLUCOSE, CAPILLARY
Glucose-Capillary: 196 mg/dL — ABNORMAL HIGH (ref 70–99)
Glucose-Capillary: 240 mg/dL — ABNORMAL HIGH (ref 70–99)
Glucose-Capillary: 112 mg/dL — ABNORMAL HIGH (ref 70–99)

## 2017-09-19 MED ORDER — METHYLPREDNISOLONE SODIUM SUCC 40 MG IJ SOLR
40.0000 mg | Freq: Two times a day (BID) | INTRAMUSCULAR | Status: DC
  Administered 2017-09-19 – 2017-09-20 (×2): 40 mg via INTRAVENOUS
  Filled 2017-09-19 (×3): qty 1

## 2017-09-19 MED ORDER — SODIUM POLYSTYRENE SULFONATE 15 GM/60ML PO SUSP
30.0000 g | Freq: Once | ORAL | Status: AC
  Administered 2017-09-19: 30 g via ORAL
  Filled 2017-09-19: qty 120

## 2017-09-19 NOTE — Progress Notes (Signed)
Pt placed on [facility] BIPAP. BIPAP plugged into red outlet

## 2017-09-19 NOTE — Care Management Note (Signed)
Case Management Note  Patient Details  Name: Eileen [name] MRN: [id number] Date of Birth: 09-Oct-1945  Subjective/Objective:  COPD. From home with daughter and grandson. Independent with ADL's. Has home oxygen with [facility], baseline 2-3 Liters, currently on 6 liters. Has neb machine. Dr. Luciana Axe is PCP. No home health.                   Action/Plan: Patient has had two admissions in six months and one additional ER visit.  Discussed [facility] services with patient. She is agreeable, will make referral.   Expected Discharge Date:   09/20/2017               Expected Discharge Plan:  Home/Self Care  In-House Referral:     Discharge planning Services  CM Consult  Post Acute Care Choice:    Choice offered to:     DME Arranged:    DME Agency:     HH Arranged:    HH Agency:     Status of Service:  In process, will continue to follow  If discussed at Long Length of Stay Meetings, dates discussed:    Additional Comments:  Allee Busk, Chauncey Reading, RN 09/19/2017, 11:53 AM

## 2017-09-19 NOTE — Progress Notes (Signed)
Nutrition Brief Note  Nutrition Screen performed as part of COPD Gold protocol.   Wt Readings from Last 15 Encounters:  09/18/17 177 lb 11.1 oz (80.6 kg)  09/01/17 177 lb (80.3 kg)  07/17/17 183 lb (83 kg)  01/24/17 183 lb 8 oz (83.2 kg)  11/16/16 185 lb (83.9 kg)  09/22/16 187 lb 3.2 oz (84.9 kg)  12/02/15 138 lb (62.6 kg)  08/21/15 128 lb (58.1 kg)  06/04/15 181 lb (82.1 kg)  03/26/15 194 lb 0.1 oz (88 kg)  12/20/14 174 lb 3.2 oz (79 kg)  03/10/11 188 lb 9.6 oz (85.5 kg)   Body mass index is 32.5 kg/m. Patient meets criteria for obese based on current BMI.   Patient states she had a slight decrease in her appetite for the past week or two due to her dyspnea. However, at this time, she says her appetite is returning. In fact, her lunch tray at bedside is 100% eaten.   When asked about any weight changes, she says she "may have lost a pound or two".   She did ask for nutritional recommendations regarding constipation. She says he has a BM only 1-2x a week. She takes "something similar to miralax" at home. RD noted that if this is a fiber based laxative, she needs to make sure she is drinking adequate fluid or the addition of the fiber will worsen her constipation. She asked about prune juice. SHe was willing to try this while admitted. RD asked ambassador to add to trays.   Patient eating 100% and has no further nutrition concerns.    If nutrition issues arise, please consult RD.   Burtis Junes RD, LDN, CNSC Clinical Nutrition Available Tues-Sat via Pager: [phone] 09/19/2017 1:43 PM

## 2017-09-19 NOTE — Progress Notes (Signed)
PROGRESS NOTE    Eileen [name]  NAT:[id number] DOB: 1945-06-03 DOA: 09/17/2017 PCP: Lavella Lemons, PA    Brief Narrative:  72 year old female who has advanced COPD, oxygen dependent and uses BiPAP nightly, was recently admitted to the hospital for COPD exacerbation.  She comes back to the hospital with worsening shortness of breath and found to have recurrent COPD exacerbation.  Admitted to the hospital on BiPAP, requiring intravenous steroids and bronchodilators.  She is slowly improving.   Assessment & Plan:   Active Problems:   COPD exacerbation ([place])   Essential hypertension   Acute on chronic respiratory failure (HCC)   Atrial flutter ([place])   Diabetes mellitus ([place])   1. Acute on chronic respiratory failure.  Patient is chronically on home oxygen.  She was started on BiPAP on admission.  This is been changed to nightly BiPAP.  Will discuss with Dr. Luan Pulling to see if she is a candidate for home positive pressure therapy.  She has advanced COPD.  Currently requiring high flow nasal cannula at 6 L.  We will try to wean down oxygen as tolerated. 2. COPD exacerbation.  Recent admission for the same.  Currently on IV steroids.  Continue bronchodilators and pulmonary hygiene.  Appears to be slowly improving.  We will start to wean steroids 3. Diabetes.  Blood sugars are uncontrolled, likely precipitated by intravenous steroids.  On metformin, sliding scale and Lantus.  Blood sugar stable.  Anticipate blood sugar should improve as steroids are weaned 4. Atrial flutter.  Heart rate is currently controlled.  Continue on metoprolol and Cardizem.  Anticoagulated with Xarelto. 5. Hypertension.  Blood pressures currently stable.   DVT prophylaxis: Xarelto Code Status: Full code Family Communication: Discussed with daughter Disposition Plan: Discharge home once improved.   Consultants:     Procedures:     Antimicrobials:       Subjective: Feels that she is slowly improving.   Has nonproductive cough..  Objective: Vitals:   09/19/17 0543 09/19/17 0608 09/19/17 0855 09/19/17 1455  BP: (!) 87/52 (!) 102/58  106/63  Pulse: 64 62  83  Resp: 20     Temp: (!) 97.2 F (36.2 C)   98.1 F (36.7 C)  TempSrc: Axillary   Oral  SpO2: 99%  93% 92%  Weight:      Height:        Intake/Output Summary (Last 24 hours) at 09/19/2017 1913 Last data filed at 09/19/2017 1730 Gross per 24 hour  Intake 1080 ml  Output -  Net 1080 ml   Filed Weights   09/17/17 1607 09/17/17 2049 09/18/17 0500  Weight: 80.3 kg (177 lb) 80.6 kg (177 lb 11.1 oz) 80.6 kg (177 lb 11.1 oz)    Examination:  General exam: Alert, awake, oriented x 3 Respiratory system: Diminished breath sounds bilaterally. Respiratory effort normal. Cardiovascular system:RRR. No murmurs, rubs, gallops. Gastrointestinal system: Abdomen is nondistended, soft and nontender. No organomegaly or masses felt. Normal bowel sounds heard. Central nervous system: Alert and oriented. No focal neurological deficits. Extremities: No C/C/E, +pedal pulses Skin: No rashes, lesions or ulcers Psychiatry: Judgement and insight appear normal. Mood & affect appropriate.    Data Reviewed: I have personally reviewed following labs and imaging studies  CBC: Recent Labs  Lab 09/17/17 1628 09/18/17 0359 09/19/17 0441  WBC 17.0* 12.8* 19.3*  NEUTROABS 14.8* 11.7* 17.6*  HGB 13.7 13.1 12.2  HCT 44.2 42.3 41.9  MCV 97.6 98.8 100.5*  PLT 285 266 280  Basic Metabolic Panel: Recent Labs  Lab 09/17/17 1628 09/17/17 1927 09/18/17 0359 09/19/17 0441  NA 139  --  141 141  K 4.4  --  5.3* 5.2*  CL 95*  --  98 93*  CO2 34*  --  36* 39*  GLUCOSE 234*  --  315* 185*  BUN 16  --  16 20  CREATININE 0.86  --  0.72 0.68  CALCIUM 9.6  --  9.2 9.2  MG  --  1.8  --   --   PHOS  --  2.3*  --   --    GFR: Estimated Creatinine Clearance: 62.5 mL/min (by C-G formula based on SCr of 0.68 mg/dL). Liver Function Tests: Recent Labs    Lab 09/17/17 1927  AST 19  ALT 18  ALKPHOS 56  BILITOT 0.6  PROT 7.7  ALBUMIN 4.2   No results for input(s): LIPASE, AMYLASE in the last 168 hours. No results for input(s): AMMONIA in the last 168 hours. Coagulation Profile: No results for input(s): INR, PROTIME in the last 168 hours. Cardiac Enzymes: Recent Labs  Lab 09/17/17 1628  TROPONINI <0.03   BNP (last 3 results) No results for input(s): PROBNP in the last 8760 hours. HbA1C: No results for input(s): HGBA1C in the last 72 hours. CBG: Recent Labs  Lab 09/18/17 1616 09/18/17 2042 09/19/17 0742 09/19/17 1127 09/19/17 1616  GLUCAP 181* 226* 196* 240* 112*   Lipid Profile: No results for input(s): CHOL, HDL, LDLCALC, TRIG, CHOLHDL, LDLDIRECT in the last 72 hours. Thyroid Function Tests: No results for input(s): TSH, T4TOTAL, FREET4, T3FREE, THYROIDAB in the last 72 hours. Anemia Panel: No results for input(s): VITAMINB12, FOLATE, FERRITIN, TIBC, IRON, RETICCTPCT in the last 72 hours. Sepsis Labs: No results for input(s): PROCALCITON, LATICACIDVEN in the last 168 hours.  Recent Results (from the past 240 hour(s))  MRSA PCR Screening     Status: None   Collection Time: 09/17/17  8:41 PM  Result Value Ref Range Status   MRSA by PCR NEGATIVE NEGATIVE Final    Comment:        The GeneXpert MRSA Assay (FDA approved for NASAL specimens only), is one component of a comprehensive MRSA colonization surveillance program. It is not intended to diagnose MRSA infection nor to guide or monitor treatment for MRSA infections. Performed at [facility], [address] [place], [place] [address]          Radiology Studies: No results found.      Scheduled Meds: . diltiazem  240 mg Oral Daily  . guaiFENesin  1,200 mg Oral BID  . insulin aspart  0-15 Units Subcutaneous TID WC  . insulin glargine  10 Units Subcutaneous Daily  . ipratropium-albuterol  3 mL Nebulization TID  . mouth rinse  15 mL Mouth Rinse  BID  . metFORMIN  500 mg Oral Q breakfast  . methylPREDNISolone (SOLU-MEDROL) injection  40 mg Intravenous Q12H  . metoprolol tartrate  50 mg Oral BID  . rivaroxaban  20 mg Oral Q supper  . [START ON 10/09/2017] Vitamin D (Ergocalciferol)  50,000 Units Oral Q30 days   Continuous Infusions:   LOS: 2 days    Time spent: 30 minutes    Kathie Dike, MD [facility] Pager [phone]  If 7PM-7AM, please contact night-coverage [email or web address] Password [facility] 09/19/2017, 7:13 PM

## 2017-09-19 NOTE — Consult Note (Signed)
   [facility] CM Inpatient Consult   09/19/2017  Eileen [name] [birth year] [id number]   Referral received.  Patient was assessed for [place] [facility] for community services. Patient was previously active with [place] [facility].  Spoke  with patient regarding being restarted with [facility] services. Consent form is active and signed on file.   Patient confirms her primary care provider to be Laurence Slate.  This office provides TOC.  Patient will be assigned RN Health Coach for COPD Disease Management.  Of note, [facility] Care Management services does not replace or interfere with any services that are arranged by inpatient case management or social work. For additional questions or referrals please contact:  Natividad Brood, RN BSN [facility] Liaison  [phone] business mobile phone Toll free office [phone]

## 2017-09-19 NOTE — Care Management Important Message (Signed)
Important Message  Patient Details  Name: [name] Cropley MRN: [id number] Date of Birth: 09/04/1945   Medicare Important Message Given:  Yes    [name] 09/19/2017, 12:06 PM

## 2017-09-20 LAB — CBC WITH DIFFERENTIAL/PLATELET
Basophils Absolute: 0 10*3/uL (ref 0.0–0.1)
Basophils Relative: 0 %
EOS PCT: 0 %
Eosinophils Absolute: 0 10*3/uL (ref 0.0–0.7)
HEMATOCRIT: 39.9 % (ref 36.0–46.0)
Hemoglobin: 11.9 g/dL — ABNORMAL LOW (ref 12.0–15.0)
LYMPHS PCT: 5 %
Lymphs Abs: 0.7 10*3/uL (ref 0.7–4.0)
MCH: 29.6 pg (ref 26.0–34.0)
MCHC: 29.8 g/dL — ABNORMAL LOW (ref 30.0–36.0)
MCV: 99.3 fL (ref 78.0–100.0)
Monocytes Absolute: 0.2 10*3/uL (ref 0.1–1.0)
Monocytes Relative: 2 %
NEUTROS ABS: 14.4 10*3/uL — AB (ref 1.7–7.7)
Neutrophils Relative %: 93 %
Platelets: 266 10*3/uL (ref 150–400)
RBC: 4.02 MIL/uL (ref 3.87–5.11)
RDW: 13.6 % (ref 11.5–15.5)
WBC: 15.3 10*3/uL — AB (ref 4.0–10.5)

## 2017-09-20 LAB — GLUCOSE, CAPILLARY
GLUCOSE-CAPILLARY: 322 mg/dL — AB (ref 70–99)
GLUCOSE-CAPILLARY: 302 mg/dL — AB (ref 70–99)
Glucose-Capillary: 218 mg/dL — ABNORMAL HIGH (ref 70–99)
Glucose-Capillary: 258 mg/dL — ABNORMAL HIGH (ref 70–99)
Glucose-Capillary: 110 mg/dL — ABNORMAL HIGH (ref 70–99)

## 2017-09-20 LAB — BASIC METABOLIC PANEL
ANION GAP: 8 (ref 5–15)
BUN: 23 mg/dL (ref 8–23)
CALCIUM: 9.2 mg/dL (ref 8.9–10.3)
CHLORIDE: 92 mmol/L — AB (ref 98–111)
CO2: 40 mmol/L — AB (ref 22–32)
CREATININE: 0.7 mg/dL (ref 0.44–1.00)
GFR calc Af Amer: >60 mL/min (ref >60)
GFR calc non Af Amer: >60 mL/min (ref >60)
Glucose, Bld: 238 mg/dL — ABNORMAL HIGH (ref 70–99)
POTASSIUM: 4.7 mmol/L (ref 3.5–5.1)
Sodium: 140 mmol/L (ref 135–145)

## 2017-09-20 MED ORDER — PREDNISONE 20 MG PO TABS
40.0000 mg | ORAL_TABLET | Freq: Every day | ORAL | Status: DC
Start: 2017-09-21 — End: 2017-09-21
  Administered 2017-09-21: 40 mg via ORAL
  Filled 2017-09-20: qty 2

## 2017-09-20 MED ORDER — INSULIN ASPART 100 UNIT/ML ~~LOC~~ SOLN
5.0000 [IU] | Freq: Once | SUBCUTANEOUS | Status: AC
  Administered 2017-09-20: 5 [IU] via SUBCUTANEOUS

## 2017-09-20 NOTE — Evaluation (Signed)
Physical Therapy Evaluation Patient Details Name: Eileen A Kathman MRN: [id number] DOB: [birth year] Today's Date: 09/20/2017   History of Present Illness  Eileen [name] is a 72 y.o. female with medical history significant of asthma/COPD, atrial flutter, history of right breast cancer, hypertension, type 2 diabetes who is coming to the emergency department with complaints of progressively worse dyspnea associated with wheezing for the past 2 weeks.  She denies fever, chills, earache, rhinorrhea or sore throat.  No chest pain, palpitations, dizziness, diaphoresis, PND, orthopnea or pitting edema lower extremities.  Denies abdominal pain, nausea, emesis, diarrhea or constipation.  No melena or hematochezia.  No dysuria, frequency or hematuria.  Denies skin rashes.  No polyuria, polydipsia or blurred vision.  No heat or cold intolerance.  Clinical Impression  Pt admitted with above diagnosis.Pt from home with dtr and grandson where she is independent [place] ambulator without AD and independent with ADLs and some IADLs, limited community ambulator, mostly due to her breathing. Pt mod I all transfers and mod I - supervision for ambulation this date due to oxygen tank. Able to ambulate 119ft no AD, no LOB, slight labored cadence but WFL. Pt near baseline level of function and PT not recommending any f/u PT once medically ready to discharge. Will sign off acutely as well but can re-evaluate with new orders if pt has a significant decline in function acutely.     Follow Up Recommendations No PT follow up    Equipment Recommendations  None recommended by PT    Recommendations for Other Services       Precautions / Restrictions Precautions Precautions: None Precaution Comments: assistance with O2 tank with ambulation only Restrictions Weight Bearing Restrictions: No      Mobility  Bed Mobility               General bed mobility comments: n/a as pt received and returned sitting  EOB  Transfers Overall transfer level: Modified independent Equipment used: None             General transfer comment: to/from bed and to/from toilet  Ambulation/Gait Ambulation/Gait assistance: Modified independent (Device/Increase time);Supervision Gait Distance (Feet): 100 Feet Assistive device: None Gait Pattern/deviations: WFL(Within Functional Limits)   Gait velocity interpretation: <1.31 ft/sec, indicative of household ambulator General Gait Details: slightly slow, labored gait, supervision for O2 tank only; pt reported this is her baseline gait speed and distance  Stairs            Wheelchair Mobility    Modified Rankin (Stroke Patients Only)       Balance Overall balance assessment: Modified Independent                                           Pertinent Vitals/Pain Pain Assessment: No/denies pain    Home Living Family/patient expects to be discharged to:: Private residence Living Arrangements: Children;Other relatives(grandson) Available Help at Discharge: Family Type of Home: House Home Access: Stairs to enter Entrance Stairs-Rails: Left Entrance Stairs-Number of Steps: 3 Home Layout: One level Home Equipment: Other (comment)(oxygen)      Prior Function Level of Independence: Needs assistance   Gait / Transfers Assistance Needed: Pt reports being a HH ambulator mainly and very limited community ambulator without an assitive device. Mostly limited due to her breathing.  ADL's / Homemaking Assistance Needed: pt reports that she performs ADLs independently and her dtr mainly  assists with IADLs (cooking, cleaning, etc.). Pt is still driving but usually her dtr drives; dtr does shopping.         Hand Dominance   Dominant Hand: Right    Extremity/Trunk Assessment   Upper Extremity Assessment Upper Extremity Assessment: Overall WFL for tasks assessed    Lower Extremity Assessment Lower Extremity Assessment: Overall WFL  for tasks assessed    Cervical / Trunk Assessment Cervical / Trunk Assessment: Normal  Communication   Communication: No difficulties  Cognition Arousal/Alertness: Awake/alert Behavior During Therapy: WFL for tasks assessed/performed Overall Cognitive Status: Within Functional Limits for tasks assessed                                        General Comments      Exercises     Assessment/Plan    PT Assessment [place] does not need any further PT services  PT Problem List         PT Treatment Interventions      PT Goals (Current goals can be found in the Care Plan section)  Acute Rehab PT Goals Patient Stated Goal: to return home PT Goal Formulation: With patient Time For Goal Achievement: 09/27/17 Potential to Achieve Goals: Good    Frequency     Barriers to discharge        Co-evaluation               AM-PAC PT "6 Clicks" Daily Activity  Outcome Measure                  End of Session Equipment Utilized During Treatment: Gait belt;Oxygen Activity Tolerance: Patient tolerated treatment well;No increased pain Patient left: in bed;with call bell/phone within reach Nurse Communication: Mobility status PT Visit Diagnosis: Difficulty in walking, not elsewhere classified (R26.2);Other abnormalities of gait and mobility (R26.89)    Time: 4315-4008 PT Time Calculation (min) (ACUTE ONLY): 16 min   Charges:   PT Evaluation $PT Eval Low Complexity: 1 Low     PT G Codes:          Geraldine Solar PT, DPT

## 2017-09-20 NOTE — Progress Notes (Signed)
PROGRESS NOTE    Eileen [name]  WUJ:[id number] DOB: [birth year] DOA: 09/17/2017 PCP: Lavella Lemons, PA    Brief Narrative:  72 year old female who has advanced COPD, oxygen dependent and uses BiPAP nightly, was recently admitted to the hospital for COPD exacerbation.  She comes back to the hospital with worsening shortness of breath and found to have recurrent COPD exacerbation.  Admitted to the hospital on BiPAP, requiring intravenous steroids and bronchodilators.  She is slowly improving.   Assessment & Plan:   Active Problems:   COPD exacerbation ([place])   Essential hypertension   Acute on chronic respiratory failure (HCC)   Atrial flutter ([place])   Diabetes mellitus ([place])   1. Acute on chronic respiratory failure.  Patient is chronically on home oxygen.  She was started on BiPAP on admission.  This is been changed to nightly BiPAP.  Oxygen has been titrated back down to her home requirement.  Discussed with Dr. Luan Pulling and she may benefit from home BiPAP.  Will request case management to look into this.  Will discontinue BiPAP for tonight check ABG in the morning. 2. COPD exacerbation.  Recent admission for the same.  Currently on IV steroids.  Continue bronchodilators and pulmonary hygiene.  Appears to be slowly improving.  Change IV steroids to prednisone taper 3. Diabetes.  On metformin, sliding scale and Lantus.  Blood sugar stable.  Anticipate blood sugar should improve as steroids are weaned 4. Atrial flutter.  Heart rate is currently controlled.  Continue on metoprolol and Cardizem.  Anticoagulated with Xarelto. 5. Hypertension.  Blood pressures currently stable.   DVT prophylaxis: Xarelto Code Status: Full code Family Communication: Discussed with daughter Disposition Plan: Discharge home once improved.   Consultants:     Procedures:     Antimicrobials:       Subjective: Breathing continues to improve.  Has a mild cough.  Was able to ambulate with physical  therapy today  Objective: Vitals:   09/19/17 2335 09/20/17 0641 09/20/17 0723 09/20/17 1415  BP:  128/70    Pulse:  80    Resp:  19    Temp:  98.1 F (36.7 C)    TempSrc:  Oral    SpO2: 96% 95% (!) 87% (!) 85%  Weight:      Height:        Intake/Output Summary (Last 24 hours) at 09/20/2017 1816 Last data filed at 09/20/2017 0639 Gross per 24 hour  Intake 240 ml  Output 300 ml  Net -60 ml   Filed Weights   09/17/17 1607 09/17/17 2049 09/18/17 0500  Weight: 80.3 kg (177 lb) 80.6 kg (177 lb 11.1 oz) 80.6 kg (177 lb 11.1 oz)    Examination:  General exam: Alert, awake, oriented x 3 Respiratory system: Diminished breath sounds bilaterally.  Respiratory effort normal. Cardiovascular system:RRR. No murmurs, rubs, gallops. Gastrointestinal system: Abdomen is nondistended, soft and nontender. No organomegaly or masses felt. Normal bowel sounds heard. Central nervous system: Alert and oriented. No focal neurological deficits. Extremities: No C/C/E, +pedal pulses Skin: No rashes, lesions or ulcers Psychiatry: Judgement and insight appear normal. Mood & affect appropriate.     Data Reviewed: I have personally reviewed following labs and imaging studies  CBC: Recent Labs  Lab 09/17/17 1628 09/18/17 0359 09/19/17 0441 09/20/17 0438  WBC 17.0* 12.8* 19.3* 15.3*  NEUTROABS 14.8* 11.7* 17.6* 14.4*  HGB 13.7 13.1 12.2 11.9*  HCT 44.2 42.3 41.9 39.9  MCV 97.6 98.8 100.5* 99.3  PLT 285  266 280 242   Basic Metabolic Panel: Recent Labs  Lab 09/17/17 1628 09/17/17 1927 09/18/17 0359 09/19/17 0441 09/20/17 0438  NA 139  --  141 141 140  K 4.4  --  5.3* 5.2* 4.7  CL 95*  --  98 93* 92*  CO2 34*  --  36* 39* 40*  GLUCOSE 234*  --  315* 185* 238*  BUN 16  --  16 20 23   CREATININE 0.86  --  0.72 0.68 0.70  CALCIUM 9.6  --  9.2 9.2 9.2  MG  --  1.8  --   --   --   PHOS  --  2.3*  --   --   --    GFR: Estimated Creatinine Clearance: 62.5 mL/min (by C-G formula based on  SCr of 0.7 mg/dL). Liver Function Tests: Recent Labs  Lab 09/17/17 1927  AST 19  ALT 18  ALKPHOS 56  BILITOT 0.6  PROT 7.7  ALBUMIN 4.2   No results for input(s): LIPASE, AMYLASE in the last 168 hours. No results for input(s): AMMONIA in the last 168 hours. Coagulation Profile: No results for input(s): INR, PROTIME in the last 168 hours. Cardiac Enzymes: Recent Labs  Lab 09/17/17 1628  TROPONINI <0.03   BNP (last 3 results) No results for input(s): PROBNP in the last 8760 hours. HbA1C: No results for input(s): HGBA1C in the last 72 hours. CBG: Recent Labs  Lab 09/19/17 1616 09/19/17 2100 09/20/17 0801 09/20/17 1151 09/20/17 1608  GLUCAP 112* 218* 258* 322* 110*   Lipid Profile: No results for input(s): CHOL, HDL, LDLCALC, TRIG, CHOLHDL, LDLDIRECT in the last 72 hours. Thyroid Function Tests: No results for input(s): TSH, T4TOTAL, FREET4, T3FREE, THYROIDAB in the last 72 hours. Anemia Panel: No results for input(s): VITAMINB12, FOLATE, FERRITIN, TIBC, IRON, RETICCTPCT in the last 72 hours. Sepsis Labs: No results for input(s): PROCALCITON, LATICACIDVEN in the last 168 hours.  Recent Results (from the past 240 hour(s))  MRSA PCR Screening     Status: None   Collection Time: 09/17/17  8:41 PM  Result Value Ref Range Status   MRSA by PCR NEGATIVE NEGATIVE Final    Comment:        The GeneXpert MRSA Assay (FDA approved for NASAL specimens only), is one component of a comprehensive MRSA colonization surveillance program. It is not intended to diagnose MRSA infection nor to guide or monitor treatment for MRSA infections. Performed at [facility], [address] [place], [place] [address]          Radiology Studies: No results found.      Scheduled Meds: . diltiazem  240 mg Oral Daily  . guaiFENesin  1,200 mg Oral BID  . insulin aspart  0-15 Units Subcutaneous TID WC  . insulin glargine  10 Units Subcutaneous Daily  . ipratropium-albuterol  3  mL Nebulization TID  . mouth rinse  15 mL Mouth Rinse BID  . metFORMIN  500 mg Oral Q breakfast  . metoprolol tartrate  50 mg Oral BID  . [START ON 09/21/2017] predniSONE  40 mg Oral Q breakfast  . rivaroxaban  20 mg Oral Q supper  . [START ON 10/09/2017] Vitamin D (Ergocalciferol)  50,000 Units Oral Q30 days   Continuous Infusions:   LOS: 3 days    Time spent: 30 minutes    Kathie Dike, MD [facility] Pager [phone]  If 7PM-7AM, please contact night-coverage [email or web address] Password [facility] 09/20/2017, 6:16 PM

## 2017-09-20 NOTE — Progress Notes (Signed)
Inpatient Diabetes Program Recommendations  AACE/ADA: New Consensus Statement on Inpatient Glycemic Control (2019)  Target Ranges:  Prepandial:   less than 140 mg/dL      Peak postprandial:   less than 180 mg/dL (1-2 hours)      Critically ill patients:  140 - 180 mg/dL  Results for Ramser, [name] (MRN [id number]) as of 09/20/2017 07:34  Ref. Range 09/19/2017 04:41 09/20/2017 04:38  Glucose Latest Ref Range: 70 - 99 mg/dL 185 (H) 238 (H)   Results for Arrick, [name] (MRN [id number]) as of 09/20/2017 07:34  Ref. Range 09/19/2017 07:42 09/19/2017 11:27 09/19/2017 16:16  Glucose-Capillary Latest Ref Range: 70 - 99 mg/dL 196 (H) 240 (H) 112 (H)    Review of Glycemic Control Diabetes history: DM2 Outpatient Diabetes medications:Metformin XR 500 mg daily Current orders for Inpatient glycemic control: Lantus 10 units daily,Novolog 0-15 units TID with meals, Metformin XR 500 mg QAM; Solumedrol 40 mg Q12H  Inpatient Diabetes Program Recommendations:  Correction (SSI): Please consider ordering Novolog 0-5 units QHS for bedtime correction. Insulin - Meal Coverage: If steroids are continued, please consider ordering Novolog 2 units TID with meals for meal coverage if patient eats at least 50% of meals.  Thanks, Barnie Alderman, RN, MSN, CDE Diabetes Coordinator Inpatient Diabetes Program [phone] (Team Pager from 8am to 5pm)

## 2017-09-21 ENCOUNTER — Other Ambulatory Visit: Payer: Self-pay | Admitting: *Deleted

## 2017-09-21 LAB — CBC WITH DIFFERENTIAL/PLATELET
BASOS ABS: 0 10*3/uL (ref 0.0–0.1)
BASOS PCT: 0 %
EOS PCT: 1 %
Eosinophils Absolute: 0.1 10*3/uL (ref 0.0–0.7)
HCT: 39.9 % (ref 36.0–46.0)
Hemoglobin: 12.2 g/dL (ref 12.0–15.0)
Lymphocytes Relative: 13 %
Lymphs Abs: 1.7 10*3/uL (ref 0.7–4.0)
MCH: 30.1 pg (ref 26.0–34.0)
MCHC: 30.6 g/dL (ref 30.0–36.0)
MCV: 98.5 fL (ref 78.0–100.0)
Monocytes Absolute: 1 10*3/uL (ref 0.1–1.0)
Monocytes Relative: 8 %
NEUTROS ABS: 10.5 10*3/uL — AB (ref 1.7–7.7)
Neutrophils Relative %: 78 %
PLATELETS: 253 10*3/uL (ref 150–400)
RBC: 4.05 MIL/uL (ref 3.87–5.11)
RDW: 13.7 % (ref 11.5–15.5)
WBC: 13.3 10*3/uL — AB (ref 4.0–10.5)

## 2017-09-21 LAB — BLOOD GAS, ARTERIAL
Acid-Base Excess: 17.2 mmol/L — ABNORMAL HIGH (ref 0.0–2.0)
BICARBONATE: 38.5 mmol/L — AB (ref 20.0–28.0)
DRAWN BY: 28459
O2 Content: 3 L/min
O2 Saturation: 92.3 %
PH ART: 7.35 (ref 7.350–7.450)
PO2 ART: 71.5 mmHg — AB (ref 83.0–108.0)
Patient temperature: 37
pCO2 arterial: 81.2 mmHg (ref 32.0–48.0)

## 2017-09-21 LAB — GLUCOSE, CAPILLARY
GLUCOSE-CAPILLARY: 226 mg/dL — AB (ref 70–99)
Glucose-Capillary: 141 mg/dL — ABNORMAL HIGH (ref 70–99)

## 2017-09-21 MED ORDER — GUAIFENESIN ER 600 MG PO TB12: 600.0000 mg | ORAL_TABLET | Freq: Two times a day (BID) | ORAL | 0 refills | Status: DC

## 2017-09-21 MED ORDER — PREDNISONE 10 MG PO TABS: ORAL_TABLET | ORAL | 1 refills | Status: DC

## 2017-09-21 NOTE — Progress Notes (Signed)
CRITICAL VALUE ALERT  Critical Value:  ABG CO2= 81.2  Date & Time Notied:  09/21/16 9494  Provider Notified: Dr. Myna Hidalgo  Orders Received/Actions taken:  New orders placed in [facility].  Nursing staff to continue to monitor

## 2017-09-21 NOTE — Care Management Important Message (Signed)
Important Message  Patient Details  Name: Eileen [name] MRN: [id number] Date of Birth: [birth year]   Medicare Important Message Given:  Yes    [name] 09/21/2017, 12:17 PM

## 2017-09-21 NOTE — Progress Notes (Signed)
Removed IV-clean, dry, intact. Daniel the RN reviewed d/c paperwork with patient and answered all questions. He also wheeled stable patient and belongings to car where she was picked up by her daughter.

## 2017-09-21 NOTE — Discharge Summary (Addendum)
Physician Discharge Summary  [name] Erpelding TFT:[id number] DOB: Mar 04, 1946 DOA: 09/17/2017  PCP: Lavella Lemons, PA  Admit date: 09/17/2017 Discharge date: 09/21/2017  Admitted From: Home Disposition: Home  Recommendations for Outpatient Follow-up:  1. Follow up with PCP in 1-2 weeks 2. Please obtain BMP/CBC in one week 3. Follow-up with Dr. Luan Pulling next week as previously scheduled 4. Please refer patient to outpatient sleep study she can be evaluated for home BiPAP nightly  Home Health: Resume home health on discharge.  She is been referred to [facility] Equipment/Devices:   Discharge Condition: Stable CODE STATUS: Full code Diet recommendation: Heart healthy, carb modified  Brief/Interim Summary: 72 year old female who has severe steroid/oxygen dependent COPD, atrial flutter, hypertension, diabetes, was admitted to the hospital for COPD exacerbation.  She was recently discharged from the hospital after being treated for the same.  She was initially placed on BiPAP and admitted to the stepdown unit.  Since that time, her overall respiratory status has improved back to baseline and she is back on nasal cannula.  Discharge Diagnoses:  Active Problems:   COPD exacerbation ([place])   Essential hypertension   Acute on chronic respiratory failure (HCC)   Atrial flutter ([place])   Diabetes mellitus ([place])  1. Acute on chronic respiratory failure.  Patient has severe chronic respiratory failure with hypoxia and hypercapnia secondary to severe COPD.  This is evident by her blood gas which shows that her pH is now compensated, but PCO2 is in the 80s.  She was initially started on BiPAP on admission.    With treatment of her COPD, her respiratory status did improve.  She has been weaned down to nasal cannula.  Case was discussed with Dr. Luan Pulling who knows her well.  It was advised that she would benefit from positive pressure therapy at night.  Attempts were made to set the patient up with BiPAP/noninvasive  ventilator at home, but she will need to have a sleep study done as an outpatient prior to any intervention.  This will need to be coordinated by her primary physician or pulmonologist.  She will follow-up with Dr. Luan Pulling next week as previously scheduled.  With her markedly elevated PCO2 at baseline, her respiratory status is quite tenuous and she is high risk for readmission 2. COPD exacerbation.  Recent admission for the same.    Treated with IV steroids.  Continue bronchodilators and pulmonary hygiene.    She is now on a prednisone taper.  Will taper back down to her usual dose of 10 mg daily. 3. Diabetes.    Resume metformin and Lantus on discharge.  Anticipate blood sugar should improve as steroids are tapered. 4. Atrial flutter.  Heart rate is currently controlled.  Continue on metoprolol and Cardizem.  Anticoagulated with Xarelto. 5. Hypertension.  Blood pressures currently stable.  Discharge Instructions  Discharge Instructions    AMB Referral to [place] Management   Complete by:  As directed    Please assign to health coach nurse for COPD disease management. Questions please call:   Natividad Brood, RN BSN [place] [facility] Liaison  [phone] business mobile phone Toll free office [phone]   Reason for consult:  COPD disease Management  Health Coach   Diagnoses of:  COPD/ Pneumonia   Expected date of contact:  1-3 days (reserved for hospital discharges)   Diet - low sodium heart healthy   Complete by:  As directed    Increase activity slowly   Complete by:  As directed  Allergies as of 09/21/2017      Reactions   Codeine Other (See Comments)   "jittery"      Medication List    TAKE these medications   acetaminophen 500 MG tablet Commonly known as:  TYLENOL Take 500 mg by mouth every other day.   blood glucose meter kit and supplies Kit Dispense based on patient and insurance preference. Use up to four times daily as directed. (FOR ICD-9  250.00, 250.01).  Check sugar before meals and at night, keep a log.   diltiazem 240 MG 24 hr capsule Commonly known as:  CARDIZEM CD TAKE ONE CAPSULE BY MOUTH DAILY.   EMERGEN-C VITAMIN C PO Take 1 Package by mouth daily.   guaiFENesin 600 MG 12 hr tablet Commonly known as:  MUCINEX Take 1 tablet (600 mg total) by mouth 2 (two) times daily.   metFORMIN 500 MG 24 hr tablet Commonly known as:  GLUCOPHAGE-XR Take 500 mg by mouth daily.   metoprolol tartrate 50 MG tablet Commonly known as:  LOPRESSOR Take 1 tablet (50 mg total) by mouth 2 (two) times daily.   OXYGEN Inhale 2.5-3 L into the lungs daily. At night and daily as needed   predniSONE 10 MG tablet Commonly known as:  DELTASONE Take 30m po daily for 2 days then 343mdaily for 2 days then 2087maily for 2 days then 23m8mily What changed:    how much to take  how to take this  when to take this  additional instructions  Another medication with the same name was removed. Continue taking this medication, and follow the directions you see here.   albuterol (2.5 MG/3ML) 0.083% nebulizer solution Commonly known as:  PROVENTIL Take 2.5 mg by nebulization every 6 (six) hours as needed for wheezing or shortness of breath.   PROAIR HFA 108 (90 Base) MCG/ACT inhaler Generic drug:  albuterol Inhale 2 puffs into the lungs every 6 (six) hours as needed for wheezing or shortness of breath.   TRELEGY ELLIPTA 100-62.5-25 MCG/INH Aepb Generic drug:  Fluticasone-Umeclidin-Vilant Take 1 puff by mouth daily.   VITAMIN C PO Take 1 tablet by mouth daily.   Vitamin D (Ergocalciferol) 50000 units Caps capsule Commonly known as:  DRISDOL Take 50,000 Units every 30 (thirty) days by mouth.   XARELTO 20 MG Tabs tablet Generic drug:  rivaroxaban TAKE ONE TABLET BY MOUTH DAILY WITH SUPPER.       Allergies  Allergen Reactions  . Codeine Other (See Comments)    "jittery"     Consultations:     Procedures/Studies: Dg Chest 2 View  Result Date: 09/17/2017 CLINICAL DATA:  Worsening dyspnea EXAM: CHEST - 2 VIEW COMPARISON:  08/31/2017 chest radiograph. FINDINGS: Stable cardiomediastinal silhouette with top-normal heart size. No pneumothorax. No pleural effusion. Emphysema. Hyperinflated lungs. No overt pulmonary edema. Stable linear reticular markings at the lung bases. No acute consolidative airspace disease. IMPRESSION: 1. No acute cardiopulmonary disease. 2. Emphysema and hyperinflated lungs, suggesting COPD. 3. Stable linear reticular markings at the lung bases suggesting nonspecific scarring or interstitial lung disease. Electronically Signed   By: JasoIlona Sorrel.   On: 09/17/2017 17:04   Dg Chest 2 View  Result Date: 08/31/2017 CLINICAL DATA:  71 y56r old female with increasing shortness of breath for 2-3 days. COPD on home oxygen. EXAM: CHEST - 2 VIEW COMPARISON:  07/17/2017 and earlier. FINDINGS: Stable large lung volumes. Stable mild cardiomegaly and mediastinal contours. No pneumothorax, pulmonary edema or pleural effusion. Increased streaky opacity  at both lung bases. No consolidation. No acute osseous abnormality identified. Negative visible bowel gas pattern. IMPRESSION: Chronic lung disease with Emphysema (ICD10-J43.9). Increased streaky opacity at both lung bases suspicious for acute infectious exacerbation. No pleural effusion. Electronically Signed   By: Genevie Ann M.D.   On: 08/31/2017 21:56       Subjective: Patient is feeling well today.  She is ambulating on oxygen without significant difficulty.  Discharge Exam: Vitals:   09/21/17 0745 09/21/17 1338  BP:  (!) 104/58  Pulse:  82  Resp:  20  Temp:  98.1 F (36.7 C)  SpO2: 92% (!) 89%   Vitals:   09/20/17 2100 09/21/17 0636 09/21/17 0745 09/21/17 1338  BP: (!) 96/48 119/70  (!) 104/58  Pulse: 88 84  82  Resp: 20 18  20   Temp: 97.7 F (36.5 C) 97.8 F (36.6 C)  98.1 F (36.7 C)   TempSrc: Oral Oral  Oral  SpO2: 100% (!) 89% 92% (!) 89%  Weight:      Height:        General: Pt is alert, awake, not in acute distress Cardiovascular: RRR, S1/S2 +, no rubs, no gallops Respiratory: CTA bilaterally, no wheezing, no rhonchi Abdominal: Soft, NT, ND, bowel sounds + Extremities: no edema, no cyanosis    The results of significant diagnostics from this hospitalization (including imaging, microbiology, ancillary and laboratory) are listed below for reference.     Microbiology: Recent Results (from the past 240 hour(s))  MRSA PCR Screening     Status: None   Collection Time: 09/17/17  8:41 PM  Result Value Ref Range Status   MRSA by PCR NEGATIVE NEGATIVE Final    Comment:        The GeneXpert MRSA Assay (FDA approved for NASAL specimens only), is one component of a comprehensive MRSA colonization surveillance program. It is not intended to diagnose MRSA infection nor to guide or monitor treatment for MRSA infections. Performed at [facility], [address] [place], [place] [address]      Labs: BNP (last 3 results) Recent Labs    09/22/16 1825 07/17/17 1725  BNP 87.0 811.0*   Basic Metabolic Panel: Recent Labs  Lab 09/17/17 1628 09/17/17 1927 09/18/17 0359 09/19/17 0441 09/20/17 0438  NA 139  --  141 141 140  K 4.4  --  5.3* 5.2* 4.7  CL 95*  --  98 93* 92*  CO2 34*  --  36* 39* 40*  GLUCOSE 234*  --  315* 185* 238*  BUN 16  --  16 20 23   CREATININE 0.86  --  0.72 0.68 0.70  CALCIUM 9.6  --  9.2 9.2 9.2  MG  --  1.8  --   --   --   PHOS  --  2.3*  --   --   --    Liver Function Tests: Recent Labs  Lab 09/17/17 1927  AST 19  ALT 18  ALKPHOS 56  BILITOT 0.6  PROT 7.7  ALBUMIN 4.2   No results for input(s): LIPASE, AMYLASE in the last 168 hours. No results for input(s): AMMONIA in the last 168 hours. CBC: Recent Labs  Lab 09/17/17 1628 09/18/17 0359 09/19/17 0441 09/20/17 0438 09/21/17 0608  WBC 17.0* 12.8* 19.3* 15.3*  13.3*  NEUTROABS 14.8* 11.7* 17.6* 14.4* 10.5*  HGB 13.7 13.1 12.2 11.9* 12.2  HCT 44.2 42.3 41.9 39.9 39.9  MCV 97.6 98.8 100.5* 99.3 98.5  PLT 285 266 280 266  253   Cardiac Enzymes: Recent Labs  Lab 09/17/17 1628  TROPONINI <0.03   BNP: Invalid input(s): POCBNP CBG: Recent Labs  Lab 09/20/17 1151 09/20/17 1608 09/20/17 2137 09/21/17 0711 09/21/17 1107  GLUCAP 322* 110* 302* 141* 226*   D-Dimer No results for input(s): DDIMER in the last 72 hours. Hgb A1c No results for input(s): HGBA1C in the last 72 hours. Lipid Profile No results for input(s): CHOL, HDL, LDLCALC, TRIG, CHOLHDL, LDLDIRECT in the last 72 hours. Thyroid function studies No results for input(s): TSH, T4TOTAL, T3FREE, THYROIDAB in the last 72 hours.  Invalid input(s): FREET3 Anemia work up No results for input(s): VITAMINB12, FOLATE, FERRITIN, TIBC, IRON, RETICCTPCT in the last 72 hours. Urinalysis    Component Value Date/Time   COLORURINE YELLOW 09/09/2009 1010   APPEARANCEUR HAZY (A) 09/09/2009 1010   LABSPEC 1.030 09/09/2009 1010   PHURINE 5.5 09/09/2009 1010   GLUCOSEU NEGATIVE 09/09/2009 1010   HGBUR NEGATIVE 09/09/2009 1010   BILIRUBINUR NEGATIVE 09/09/2009 1010   KETONESUR NEGATIVE 09/09/2009 1010   PROTEINUR NEGATIVE 09/09/2009 1010   UROBILINOGEN 0.2 09/09/2009 1010   NITRITE NEGATIVE 09/09/2009 1010   LEUKOCYTESUR  09/09/2009 1010    NEGATIVE MICROSCOPIC NOT DONE ON URINES WITH NEGATIVE PROTEIN, BLOOD, LEUKOCYTES, NITRITE, OR GLUCOSE <1000 mg/dL.   Sepsis Labs Invalid input(s): PROCALCITONIN,  WBC,  LACTICIDVEN Microbiology Recent Results (from the past 240 hour(s))  MRSA PCR Screening     Status: None   Collection Time: 09/17/17  8:41 PM  Result Value Ref Range Status   MRSA by PCR NEGATIVE NEGATIVE Final    Comment:        The GeneXpert MRSA Assay (FDA approved for NASAL specimens only), is one component of a comprehensive MRSA colonization surveillance program. It is  not intended to diagnose MRSA infection nor to guide or monitor treatment for MRSA infections. Performed at [facility], [address], [place], [place] [address]      Time coordinating discharge: 29mns  SIGNED:   JKathie Dike MD  [facility] 09/21/2017, 2:30 PM Pager   If 7PM-7AM, please contact night-coverage [email or web address] Password [email or web address]

## 2017-09-21 NOTE — Care Management (Signed)
Patient's insurance will not approve NIV. She will need prior authorization.  [facility] will work with Dr. Luan Pulling to obtain NIV or Bipap for patient.

## 2017-09-21 NOTE — Patient Outreach (Signed)
[place] [facility]) Care Management  09/21/2017  Eileen [name] 1945-11-01 [id number]  Referral via RED EMMI-COPD -Day#11, 09/16/2017: Reason-Lost interest in things they use to do-yes Sad, anxious-yes  Telephone call attempt x 2 -advised pt not available & call back at later date.  Plan: Will follow up 2-4 business days. Outreach letter sent 09/17/2017.  Sherrin Daisy, RN BSN [place] Management Coordinator [facility] Care Management  [phone]

## 2017-09-21 NOTE — Care Management (Signed)
Discussed need for Bipap for patient with Juliann Pulse of [facility]. She will look into patient chart and see if patient qualifies.

## 2017-09-25 ENCOUNTER — Other Ambulatory Visit: Payer: Self-pay | Admitting: *Deleted

## 2017-09-25 NOTE — Patient Outreach (Signed)
[place] [facility]) Care Management  09/25/2017  Eileen [name] 29-Jan-1946 [id number]  Referral via RED EMMI-COPD -Day#11, 09/16/2017: Reason-Lost interest in things they use to do-yes Sad, anxious-yes  Telephone call #3; phone rang with no answer. Unable to leave message.  Plan: Outreach letter sent 7/8. Will close out 7/22 if no response from patient.   Sherrin Daisy, RN BSN [place] Management Coordinator [facility] Care Management  [phone]

## 2017-09-25 NOTE — Patient Outreach (Signed)
[place] [facility]) Care Management  09/25/2017  Eileen [name] 02-May-1945 [id number]  Referral via RED EMMI-COPD -Day#11, 09/16/2017: Reason-Lost interest in things they use to do-yes Sad, anxious-yes  Telephone call #3 attempt; unable to leave message.  Plan: Outreach letter sent 09/17/2017 Close out 7/22 if no response.  Sherrin Daisy, RN BSN [place] Management Coordinator [facility] Care Management  [phone]

## 2017-09-27 DIAGNOSIS — I4892 Unspecified atrial flutter: Secondary | ICD-10-CM | POA: Diagnosis not present

## 2017-09-27 DIAGNOSIS — J9611 Chronic respiratory failure with hypoxia: Secondary | ICD-10-CM | POA: Diagnosis not present

## 2017-09-27 DIAGNOSIS — I5032 Chronic diastolic (congestive) heart failure: Secondary | ICD-10-CM | POA: Diagnosis not present

## 2017-09-27 DIAGNOSIS — J449 Chronic obstructive pulmonary disease, unspecified: Secondary | ICD-10-CM | POA: Diagnosis not present

## 2017-10-03 ENCOUNTER — Other Ambulatory Visit: Payer: Self-pay | Admitting: *Deleted

## 2017-10-03 NOTE — Patient Outreach (Signed)
[place] [facility]) Care Management  10/03/2017  Eileen [name] [birth year] [id number]  Unsuccessful call attempts x 3. No response to outreach letter.  Plan: Case closure.   Sherrin Daisy, RN BSN [place] Management Coordinator [facility] Care Management  [phone]

## 2017-10-04 DIAGNOSIS — E119 Type 2 diabetes mellitus without complications: Secondary | ICD-10-CM | POA: Diagnosis not present

## 2017-10-04 DIAGNOSIS — J449 Chronic obstructive pulmonary disease, unspecified: Secondary | ICD-10-CM | POA: Diagnosis not present

## 2017-10-04 DIAGNOSIS — J441 Chronic obstructive pulmonary disease with (acute) exacerbation: Secondary | ICD-10-CM | POA: Diagnosis not present

## 2017-10-04 DIAGNOSIS — I4892 Unspecified atrial flutter: Secondary | ICD-10-CM | POA: Diagnosis not present

## 2017-10-04 DIAGNOSIS — I10 Essential (primary) hypertension: Secondary | ICD-10-CM | POA: Diagnosis not present

## 2017-10-08 ENCOUNTER — Other Ambulatory Visit: Payer: Self-pay | Admitting: Cardiovascular Disease

## 2017-10-10 ENCOUNTER — Other Ambulatory Visit: Payer: Self-pay

## 2017-10-10 DIAGNOSIS — R0902 Hypoxemia: Secondary | ICD-10-CM | POA: Diagnosis not present

## 2017-10-10 DIAGNOSIS — J449 Chronic obstructive pulmonary disease, unspecified: Secondary | ICD-10-CM | POA: Diagnosis not present

## 2017-10-10 NOTE — Patient Outreach (Signed)
[place] [facility]) Care Management  10/10/2017   Eileen [name] 12/02/1945 [id number]      Outreach attempt # 1 to the patient.  HIPAA verified.  THN services explained to the patient and she agreed.    Social:The patient lives in the home with her daughter and grandson.  Her daughter is very supportive.  She states that she is able to perform her ADLS independently and her IADLS independently assist.  Her daughter takes her to her appointments. Te durable medical equipment in the home consist of Eyeglasses, CBG meter, nebulizer, Oxygen, Dentures and scale.    Conditions: Per chart review and talking with the patient PMH includes Atrial flutter, COPD DM type 2, HTN.  The patient states that she is not having any shortness of breath today.  This  morning she did  Nebulizer treatment.  She states that the heat affects her breathing.  I advised the patient to stay out of the heat and keep herself hydrated.  She verbalized understanding. The patient has oxygen that she states that she uses. She is on 2-3 liters. Reviewed signs and symptoms of COPD, discussed the action plan and medication adherence.   Medications:  Per chart review the patient is taking 15 meds. The patient states that she is adherent with her medications.  She is able to manage her meds.  The patient expressed needing help with the cost of her Trelegy inhaler.  RN Health Coach put in a referral and explained the process.  Appointments:The patient has an appointment with her cardiologist in September.    Advanced Directives: The patient states that she does not have an Advanced Directive but would like for me to send her some information.  Current Medications:  Current Outpatient Medications  Medication Sig Dispense Refill  . acetaminophen (TYLENOL) 500 MG tablet Take 500 mg by mouth every other day.    . albuterol (PROVENTIL) (2.5 MG/3ML) 0.083% nebulizer solution Take 2.5 mg by nebulization every 6 (six)  hours as needed for wheezing or shortness of breath.    . Ascorbic Acid (VITAMIN C PO) Take 1 tablet by mouth daily.    . blood glucose meter kit and supplies KIT Dispense based on patient and insurance preference. Use up to four times daily as directed. (FOR ICD-9 250.00, 250.01).  Check sugar before meals and at night, keep a log. 1 each 0  . diltiazem (CARDIZEM CD) 240 MG 24 hr capsule TAKE ONE CAPSULE BY MOUTH DAILY. 30 capsule 2  . guaiFENesin (MUCINEX) 600 MG 12 hr tablet Take 1 tablet (600 mg total) by mouth 2 (two) times daily. 30 tablet 0  . metFORMIN (GLUCOPHAGE-XR) 500 MG 24 hr tablet Take 500 mg by mouth daily.  1  . metoprolol tartrate (LOPRESSOR) 50 MG tablet Take 1 tablet (50 mg total) by mouth 2 (two) times daily. 30 tablet 0  . Multiple Vitamins-Minerals (EMERGEN-C VITAMIN C PO) Take 1 Package by mouth daily.    . OXYGEN Inhale 2.5-3 L into the lungs daily. At night and daily as needed    . predniSONE (DELTASONE) 10 MG tablet Take 82m po daily for 2 days then 351mdaily for 2 days then 2025maily for 2 days then 56m9mily 30 tablet 1  . PROAIR HFA 108 (90 BASE) MCG/ACT inhaler Inhale 2 puffs into the lungs every 6 (six) hours as needed for wheezing or shortness of breath.     . TRELEGY ELLIPTA 100-62.5-25 MCG/INH AEPB Take 1 puff by  mouth daily.  0  . Vitamin D, Ergocalciferol, (DRISDOL) 50000 units CAPS capsule Take 50,000 Units every 30 (thirty) days by mouth.    Alveda Reasons 20 MG TABS tablet TAKE ONE TABLET BY MOUTH DAILY WITH SUPPER. 30 tablet 6   No current facility-administered medications for this visit.     Functional Status:  In your present state of health, do you have any difficulty performing the following activities: 10/10/2017 09/17/2017  Hearing? N N  Vision? N N  Difficulty concentrating or making decisions? N N  Walking or climbing stairs? Y Y  Comment - pt gets short of breath when walking and climbing the stairs  Dressing or bathing? N N  Doing errands,  shopping? N N  Some recent data might be hidden    Fall/Depression Screening: Fall Risk  10/10/2017 10/03/2016  Falls in the past year? No Yes  Number falls in past yr: - 1  Injury with Fall? - No  Risk for fall due to : - History of fall(s);Impaired mobility;Impaired vision;Medication side effect;Impaired balance/gait  Follow up - Follow up appointment   PHQ 2/9 Scores 10/10/2017 10/03/2016  PHQ - 2 Score 2 0  PHQ- 9 Score 6 -    Assessment: Patient will benefit from health coach outreach for disease management and support.   THN CM Care Plan Problem One     Most Recent Value  Care Plan Problem One  knowledge deficit related to [place] management  Role Documenting the Problem One  Health Coach  Care Plan for Problem One  Active  THN Long Term Goal   in 55 daysthe patient verbalize no admission for copd exacerbation  THN Long Term Goal Start Date  10/10/17  Interventions for Problem One Long Term Goal  Reviewed signs and symptoms of COPD, discussed the action plan and medication adherence  THN CM Short Term Goal #1   In 30 days the patient will state that she is following a low salt diet.  THN CM Short Term Goal #1 Start Date  10/10/17  Interventions for Short Term Goal #1  dicussed food and fluid intake.  THN CM Short Term Goal #2   in 30 days th epatient will be able to give 2 symptoms in the copd action plan red zone.  THN CM Short Term Goal #2 Start Date  10/10/17  Interventions for Short Term Goal #2  will send educational material copd packet with copd action plan      Plan:RN Health Coach will provide ongoing education for patient on copd through phone calls and sending printed information to patient for further discussion.  RN Health Coach will send welcome packet with consent to patient as well as printed information on copd.  RN Health Coach will send initial barriers letter, assessment, and care plan to primary care physician.   RN Health Coach will contact patient in the  month of August and patient agrees to next outreach. RN Health Coach sent referral to pharmacy for medication assistance   [name] RN, BSN, [place] Direct Dial:  [phone]  Fax: [phone]

## 2017-10-12 ENCOUNTER — Encounter: Payer: Self-pay | Admitting: Pharmacist

## 2017-10-15 ENCOUNTER — Other Ambulatory Visit: Payer: Self-pay | Admitting: Pharmacist

## 2017-10-15 ENCOUNTER — Ambulatory Visit: Payer: Self-pay | Admitting: Pharmacist

## 2017-10-15 NOTE — Patient Outreach (Signed)
[facility] [name]) Care Management  10/15/2017  Eileen [name] 1945/10/25 [id number]   Successful call to [name] with HIPAA identifiers verified.  Patient is a 36 yoF referred to [place] Management Pharmacist for medication assistance. PMH includes, but not limited to: COPD, Afib, DMT2, HTN.  Per THN  SUBJECTIVE: Patient manages her medications on her own without difficulty.  She is agreeable to reviewing her medications and allergies telephonically.  She expresses difficulty affording Trelegy and more recently Xarelto.  Patient receives [phone] per month from social security and also receives extra help, but it unsure how much and cannot find her Medicare card. Patient is paying $86/month for her Trelegy inhaler, $45/month for Xarelto, $45/month for Ventolin rescue inhaler (not having to use often).  She only received one sample on Trelegy inhaler from MD and stated it helped tremendously.  OBJECTIVE: Medications Reviewed Today    Reviewed by Lavera Guise, [facility] (Pharmacist) on 10/15/17 at [place] List Status: <None>  Medication Order Taking? Sig Documenting Provider Last Dose Status Informant  acetaminophen (TYLENOL) 500 MG tablet [id number]  Take 500 mg by mouth every 8 (eight) hours as needed for mild pain or headache.  Lavella Lemons, PA  Active Self  albuterol (PROVENTIL) (2.5 MG/3ML) 0.083% nebulizer solution [id number] Yes Take 2.5 mg by nebulization every 6 (six) hours as needed for wheezing or shortness of breath. [provider] Taking Active Pharmacy Records  Ascorbic Acid (VITAMIN C PO) [id number] Yes Take 1 tablet by mouth daily. [provider] Taking Active Pharmacy Records  blood glucose meter kit and supplies KIT [id number] Yes Dispense based on patient and insurance preference. Use up to four times daily as directed. (FOR ICD-9 250.00, 250.01).  Check sugar before meals and at night, keep a log. Arrien, Jimmy Picket, MD Taking Active  Pharmacy Records  diltiazem [name] CD) 240 MG 24 hr capsule [id number] Yes TAKE ONE CAPSULE BY MOUTH DAILY. Herminio Commons, MD Taking Active   guaiFENesin (MUCINEX) 600 MG 12 hr tablet [id number] Yes Take 1 tablet (600 mg total) by mouth 2 (two) times daily. Kathie Dike, MD Taking Active   metFORMIN (GLUCOPHAGE-XR) 500 MG 24 hr tablet [id number] Yes Take 500 mg by mouth daily. [provider] Taking Active Pharmacy Records  metoprolol tartrate (LOPRESSOR) 50 MG tablet [id number] Yes Take 1 tablet (50 mg total) by mouth 2 (two) times daily. Arrien, Jimmy Picket, MD Taking Active Pharmacy Records  Multiple Vitamins-Minerals [name] VITAMIN C PO) [id number] Yes Take 1 Package by mouth daily. [provider] Taking Active Pharmacy Records  OXYGEN [id number] Yes Inhale 2.5-3 L into the lungs daily. At night and daily as needed [provider] Taking Active Pharmacy Records  predniSONE (DELTASONE) 10 MG tablet [id number] Yes Take 72m po daily for 2 days then 326mdaily for 2 days then 2027maily for 2 days then 52m52mily MemoKathie Dike Taking Active   PROAIR HFA 108 (90 BASE) MCG/ACT inhaler [id number] Inhale 2 puffs into the lungs every 6 (six) hours as needed for wheezing or shortness of breath.  [provider] Taking Active Pharmacy Records  TRELEGY ELLIPTA 100-62.5-25 MCG/INH AEPB [id number] Take 1 puff by mouth daily. [provider] Taking Active Pharmacy Records  Vitamin D, Ergocalciferol, (DRISDOL) 50000 units CAPS capsule [id number] Take 50,000 Units every 30 (thirty) days by mouth. [provider] Taking Active Pharmacy Records  XARELTO 20 MG TABS tablet [id number] TAKE ONE TABLET BY  MOUTH DAILY WITH SUPPER. Herminio Commons, MD Taking Active Pharmacy Records  Med List Note Mod[place] Morrow 09/01/17 1219): Meds verified by pharmacy records.         ASSESSMENT:  Medications reviewed  telephonically:  Drugs sorted by system:  Cardiovascular: Diltiazem, Xarelto, Metoprolol  Pulmonary/Allergy: Trelegy inhaler, Albuterol PRN inhaler, Albuterol nebulizer PRN, prednisone  Endocrine: Metformin  Pain: APAP PRN  Vitamins/Minerals: MVI, vitamin D, vitamin C  Medication Review Findings:  -Patient has [facility] and patient to retrieve a copy of TROOP or EOB to include in PAP packet to mail back to [facility] -Patient has partial help, however does not have her card with her and unsure of %LIS -He may qualify to get Trelegy & Ventolin from [place].  -He may also qualify to receive Xarelto from [place]: -I will route PAP letter to Etter Sjogren, [place] will assist with contacting provider & patient to obtain necessary application requirements for Trelegy ([place]), Ventolin ([place]), and Xarelto Alphonsa Overall) -I will route note to patient's PCP to alert her about the patient assistance forms -I will follow up with patient in 2 weeks to check status of PAPs  Regina Eck, PharmD, [place]  [phone]

## 2017-10-22 ENCOUNTER — Other Ambulatory Visit: Payer: Self-pay | Admitting: Pharmacy Technician

## 2017-10-22 NOTE — Patient Outreach (Signed)
[name] [name]) Care Management  10/22/2017  Eileen [name] Sep 18, 1945 [id number]   Received [place] and [name] patient assistance referral from [place]. Prepared patient portion to be mailed and faxed provider portions to Ricardo Jericho.  Will follow up with patient in 7-10 business days to confirm applications have been received.  Maud Deed Burns, [place] Management [phone]

## 2017-10-25 ENCOUNTER — Other Ambulatory Visit: Payer: Self-pay

## 2017-10-25 NOTE — Patient Outreach (Signed)
[place] [facility]) Care Management  10/25/2017  [name] Lacombe May 24, 1945 [id number]    Received call from the patient about her blood sugar.  HIPAA verified.  The patient states that she is taking prednisone and her blood sugar was 194. It is usually runs in the 160's The patient states that she was not feeling any symptoms.  I advised the patient that she can drink water and move around if possible.  She can watch her food intake and  monitor her blood sugars.  I also advised her since she will be taking this on a regular basis to call her physician office to ask them how they would like for her to handle high blood sugars.  The patient verbalized understanding and stated that she did recheck her blood sugar and it was 189.  It was starting to come down.  Plan:  [place] will outreach the patient at the next scheduled interval.  [place], BSN, [place]:  [phone]  Fax: [phone]

## 2017-10-29 ENCOUNTER — Other Ambulatory Visit: Payer: Self-pay | Admitting: Pharmacist

## 2017-10-29 NOTE — Patient Outreach (Signed)
[place] [facility]) Care Management  10/29/2017  Eileen [name] [birth year] [id number]   Incoming call from [name] with HIPAA identifiers verified.  Patient is a 55 yoF referred to [place] Management Pharmacist for medication assistance. PMH includes, but not limited to: COPD, Afib, DMT2, HTN.    SUBJECTIVE: Patient continues to manage her medications on her own without difficulty.  Patient stated she had received the packet for patient assistance.  She called to ask how to answer household income and size.  [facility] Pharmacist reviewed this information with patient and patient verbalized understanding. Patient stated she will need to find social security benefits information and will plan to mail PAPs back as soon as possible.   Of note, patient stated that the original prescriber of Xarelto was Dr. Kate Sable and Trelegy was prescribed by Dr. Sinda Du.    PLAN: [facility] Pharmacist will forward prescriber information to [place] and follow up with patient in 2 weeks regarding PAPs  Regina Eck, PharmD, [place]  [phone]

## 2017-10-31 DIAGNOSIS — J9611 Chronic respiratory failure with hypoxia: Secondary | ICD-10-CM | POA: Diagnosis not present

## 2017-10-31 DIAGNOSIS — I10 Essential (primary) hypertension: Secondary | ICD-10-CM | POA: Diagnosis not present

## 2017-10-31 DIAGNOSIS — I5032 Chronic diastolic (congestive) heart failure: Secondary | ICD-10-CM | POA: Diagnosis not present

## 2017-10-31 DIAGNOSIS — J449 Chronic obstructive pulmonary disease, unspecified: Secondary | ICD-10-CM | POA: Diagnosis not present

## 2017-11-02 DIAGNOSIS — Z1231 Encounter for screening mammogram for malignant neoplasm of breast: Secondary | ICD-10-CM | POA: Diagnosis not present

## 2017-11-02 DIAGNOSIS — Z9011 Acquired absence of right breast and nipple: Secondary | ICD-10-CM | POA: Diagnosis not present

## 2017-11-07 ENCOUNTER — Ambulatory Visit: Payer: Self-pay | Admitting: Pharmacist

## 2017-11-07 ENCOUNTER — Other Ambulatory Visit: Payer: Self-pay | Admitting: Pharmacist

## 2017-11-07 NOTE — Patient Outreach (Signed)
[place] [facility]) Care Management  11/07/2017  Eileen [name] [birth year] [id number]   Successful call to [name] to follow up regarding medication assistance.  HIPAA identifiers verified.   Patient states she has mailed back PAP packet to [facility], but forgot to include her financial documents.  She said there was another envelop included in her packet and she will mail it back tomorrow.    Information was routed to [place], Etter Sjogren.  PLAN:  -I will follow up with patient next week  Regina Eck, PharmD, [place]  [phone]

## 2017-11-08 ENCOUNTER — Other Ambulatory Visit: Payer: Self-pay

## 2017-11-08 NOTE — Patient Outreach (Signed)
[place] [facility]) Care Management  11/08/2017  Eileen [name] 12-Aug-1945 [id number]    1st telephone outreach to the patient for assessment.Son answered the phone.  Patient not at home.  HIPAA compliant voicemail left with contact information.  Plan: RN Health Coach will send letter. RN Health Coach will make another attempt to the patient within four business days.  [name] RN, BSN, [place] Direct Dial:  [phone]  Fax: [phone]

## 2017-11-10 DIAGNOSIS — J449 Chronic obstructive pulmonary disease, unspecified: Secondary | ICD-10-CM | POA: Diagnosis not present

## 2017-11-10 DIAGNOSIS — R0902 Hypoxemia: Secondary | ICD-10-CM | POA: Diagnosis not present

## 2017-11-14 ENCOUNTER — Ambulatory Visit: Payer: Self-pay | Admitting: Pharmacist

## 2017-11-14 ENCOUNTER — Other Ambulatory Visit: Payer: Self-pay

## 2017-11-14 DIAGNOSIS — S9032XA Contusion of left foot, initial encounter: Secondary | ICD-10-CM | POA: Diagnosis not present

## 2017-11-14 NOTE — Patient Outreach (Signed)
[place] [facility]) Care Management  11/14/2017   [name] Knoff 1946-02-03 [id number]  Subjective: Telephone call to the patient for assessment. HIPAA verified.  The patient states that she has been doing well.  She does state that she was seen by Aggie Hacker PA today for her eft foot.  It has been swollen and she has a knot.  She states that she has not fallen or hit her foot on anything.  She denies any pain.  She reports that her breathing has been better.  She is not sure if  it is due to the cooler temperatures.  She also has started using Xarelto and Trelegy.  She has expressed for paying for the medication.  I advised the patient that I will put in a referral for medication assistance.  I explained the process to her and she states that she is fine with providing her financial information.  The patient states that her blood sugar today was 130.  We discussed food intake, and medication management.  She verbalized understanding. I informed the patient that I will send her Living well with Diabetes   Current Medications:  Current Outpatient Medications  Medication Sig Dispense Refill  . acetaminophen (TYLENOL) 500 MG tablet Take 500 mg by mouth every 8 (eight) hours as needed for mild pain or headache.     . albuterol (PROVENTIL) (2.5 MG/3ML) 0.083% nebulizer solution Take 2.5 mg by nebulization every 6 (six) hours as needed for wheezing or shortness of breath.    . Ascorbic Acid (VITAMIN C PO) Take 1 tablet by mouth daily.    . blood glucose meter kit and supplies KIT Dispense based on patient and insurance preference. Use up to four times daily as directed. (FOR ICD-9 250.00, 250.01).  Check sugar before meals and at night, keep a log. 1 each 0  . diltiazem (CARDIZEM CD) 240 MG 24 hr capsule TAKE ONE CAPSULE BY MOUTH DAILY. 30 capsule 2  . guaiFENesin (MUCINEX) 600 MG 12 hr tablet Take 1 tablet (600 mg total) by mouth 2 (two) times daily. 30 tablet 0  . metFORMIN  (GLUCOPHAGE-XR) 500 MG 24 hr tablet Take 500 mg by mouth daily.  1  . metoprolol tartrate (LOPRESSOR) 50 MG tablet Take 1 tablet (50 mg total) by mouth 2 (two) times daily. 30 tablet 0  . Multiple Vitamins-Minerals (EMERGEN-C VITAMIN C PO) Take 1 Package by mouth daily.    . OXYGEN Inhale 2.5-3 L into the lungs daily. At night and daily as needed    . predniSONE (DELTASONE) 10 MG tablet Take '40mg'$  po daily for 2 days then '30mg'$  daily for 2 days then '20mg'$  daily for 2 days then '10mg'$  daily 30 tablet 1  . PROAIR HFA 108 (90 BASE) MCG/ACT inhaler Inhale 2 puffs into the lungs every 6 (six) hours as needed for wheezing or shortness of breath.     . TRELEGY ELLIPTA 100-62.5-25 MCG/INH AEPB Take 1 puff by mouth daily.  0  . Vitamin D, Ergocalciferol, (DRISDOL) 50000 units CAPS capsule Take 50,000 Units every 30 (thirty) days by mouth.    Alveda Reasons 20 MG TABS tablet TAKE ONE TABLET BY MOUTH DAILY WITH SUPPER. 30 tablet 6   No current facility-administered medications for this visit.     Functional Status:  In your present state of health, do you have any difficulty performing the following activities: 10/10/2017 09/17/2017  Hearing? N N  Vision? N N  Difficulty concentrating or making decisions? N N  Walking or climbing stairs? Y Y  Comment - pt gets short of breath when walking and climbing the stairs  Dressing or bathing? N N  Doing errands, shopping? N N  Some recent data might be hidden    Fall/Depression Screening: Fall Risk  11/14/2017 10/10/2017 10/03/2016  Falls in the past year? No No Yes  Number falls in past yr: - - 1  Injury with Fall? - - No  Risk for fall due to : - - History of fall(s);Impaired mobility;Impaired vision;Medication side effect;Impaired balance/gait  Follow up - - Follow up appointment   PHQ 2/9 Scores 10/10/2017 10/03/2016  PHQ - 2 Score 2 0  PHQ- 9 Score 6 -    Assessment: Patient will continue to benefit from health coach outreach for disease management and  support.  THN CM Care Plan Problem One     Most Recent Value  THN Long Term Goal   in 49 daysthe patient verbalize no admission for copd exacerbation  THN Long Term Goal Start Date  11/14/17  Interventions for Problem One Long Term Goal  Dsicussed signs and symptoms and medication      Plan: RN Health Coach will contact patient in the month of November and patient agrees to next outreach.  [name] RN, BSN, [place] Direct Dial:  [phone]  Fax: [phone]

## 2017-11-15 ENCOUNTER — Ambulatory Visit: Payer: Self-pay | Admitting: Pharmacist

## 2017-11-15 ENCOUNTER — Other Ambulatory Visit: Payer: Self-pay | Admitting: Pharmacist

## 2017-11-15 NOTE — Patient Outreach (Addendum)
[facility]) Care Management  11/15/2017  Eileen [name] August 20, 1945 [id number]   Unsuccessful outreach call attempt to Eileen [name].  Unable to leave HIPAA compliant message on home phone due to busy signal x3 attempts. Patient has received packet and mailed incomplete portion to [facility].  Spoke with patient last week and she stated she would be mailing in remaining financial portion that is required.  UPDATE: Incoming call from Eileen [name].  HIPAA identifiers verified.  Eileen [name] states she has mailed back complete application information to [place] Management CPhT, Etter Sjogren.    PLAN: -I will f/u with patient 2 weeks to check on status of PAP -I will alert [facility]N CPhT, Etter Sjogren of status   Regina Eck, PharmD, [place]  [phone]

## 2017-11-16 ENCOUNTER — Ambulatory Visit: Payer: Self-pay | Admitting: Pharmacist

## 2017-11-19 ENCOUNTER — Encounter: Payer: Self-pay | Admitting: Cardiovascular Disease

## 2017-11-19 ENCOUNTER — Ambulatory Visit (INDEPENDENT_AMBULATORY_CARE_PROVIDER_SITE_OTHER): Payer: Medicare Other | Admitting: Cardiovascular Disease

## 2017-11-19 VITALS — BP 118/78 | HR 71 | Ht 62.0 in | Wt 172.0 lb

## 2017-11-19 DIAGNOSIS — I10 Essential (primary) hypertension: Secondary | ICD-10-CM

## 2017-11-19 DIAGNOSIS — Z9289 Personal history of other medical treatment: Secondary | ICD-10-CM

## 2017-11-19 DIAGNOSIS — I480 Paroxysmal atrial fibrillation: Secondary | ICD-10-CM

## 2017-11-19 NOTE — Progress Notes (Signed)
SUBJECTIVE: The patient presents for routine follow-up.  He was hospitalized in early July 2019 for acute on chronic respiratory failure with COPD exacerbation. He has a history of atrial flutter and atrial fibrillation.  I personally reviewed the ECG performed on 08/31/2017 which demonstrated normal sinus rhythm with a PAC and sinus arrhythmia.  She chronically uses oxygen 2 to 3 L daily.  She seldom has nosebleeds.  She denies chest pain and palpitations.    Review of Systems: As per "subjective", otherwise negative.  Allergies  Allergen Reactions  . Codeine Other (See Comments)    "jittery"    Current Outpatient Medications  Medication Sig Dispense Refill  . acetaminophen (TYLENOL) 500 MG tablet Take 500 mg by mouth every 8 (eight) hours as needed for mild pain or headache.     . albuterol (PROVENTIL) (2.5 MG/3ML) 0.083% nebulizer solution Take 2.5 mg by nebulization every 6 (six) hours as needed for wheezing or shortness of breath.    . Ascorbic Acid (VITAMIN C PO) Take 1 tablet by mouth daily.    . blood glucose meter kit and supplies KIT Dispense based on patient and insurance preference. Use up to four times daily as directed. (FOR ICD-9 250.00, 250.01).  Check sugar before meals and at night, keep a log. 1 each 0  . diltiazem (CARDIZEM CD) 240 MG 24 hr capsule TAKE ONE CAPSULE BY MOUTH DAILY. 30 capsule 2  . guaiFENesin (MUCINEX) 600 MG 12 hr tablet Take 1 tablet (600 mg total) by mouth 2 (two) times daily. 30 tablet 0  . metFORMIN (GLUCOPHAGE-XR) 500 MG 24 hr tablet Take 500 mg by mouth daily.  1  . metoprolol tartrate (LOPRESSOR) 50 MG tablet Take 1 tablet (50 mg total) by mouth 2 (two) times daily. 30 tablet 0  . Multiple Vitamins-Minerals (EMERGEN-C VITAMIN C PO) Take 1 Package by mouth daily.    . OXYGEN Inhale 2.5-3 L into the lungs daily. At night and daily as needed    . predniSONE (DELTASONE) 10 MG tablet Take 15m po daily for 2 days then 343mdaily for 2  days then 2048maily for 2 days then 79m42mily 30 tablet 1  . PROAIR HFA 108 (90 BASE) MCG/ACT inhaler Inhale 2 puffs into the lungs every 6 (six) hours as needed for wheezing or shortness of breath.     . TRELEGY ELLIPTA 100-62.5-25 MCG/INH AEPB Take 1 puff by mouth daily.  0  . Vitamin D, Ergocalciferol, (DRISDOL) 50000 units CAPS capsule Take 50,000 Units every 30 (thirty) days by mouth.    . XAAlveda ReasonsMG TABS tablet TAKE ONE TABLET BY MOUTH DAILY WITH SUPPER. 30 tablet 6   No current facility-administered medications for this visit.     Past Medical History:  Diagnosis Date  . Asthma   . Atrial flutter (HCC)[place]. COPD (chronic obstructive pulmonary disease) (HCC)[place]. History of breast cancer    right breast  . Hypertension   . Type 2 diabetes mellitus (HCC)[place]  Past Surgical History:  Procedure Laterality Date  . CESAREAN SECTION    . HERNIA REPAIR     RIH  . MASTECTOMY  2011   right breast    Social History   Socioeconomic History  . Marital status: Single    Spouse name: Not on file  . Number of children: Not on file  . Years of education: Not on file  . Highest education level:  Not on file  Occupational History  . Not on file  Social Needs  . Financial resource strain: Not on file  . Food insecurity:    Worry: Not on file    Inability: Not on file  . Transportation needs:    Medical: Not on file    Non-medical: Not on file  Tobacco Use  . Smoking status: Former Smoker    Packs/day: 0.50    Years: 32.00    Pack years: 16.00    Types: Cigarettes    Start date: 12/12/1962    Last attempt to quit: 03/13/1994    Years since quitting: 23.7  . Smokeless tobacco: Never Used  Substance and Sexual Activity  . Alcohol use: No    Alcohol/week: 0.0 standard drinks  . Drug use: No  . Sexual activity: Not on file  Lifestyle  . Physical activity:    Days per week: Not on file    Minutes per session: Not on file  . Stress: Not on file  Relationships  . Social  connections:    Talks on phone: Not on file    Gets together: Not on file    Attends religious service: Not on file    Active member of club or organization: Not on file    Attends meetings of clubs or organizations: Not on file    Relationship status: Not on file  . Intimate partner violence:    Fear of current or ex partner: Not on file    Emotionally abused: Not on file    Physically abused: Not on file    Forced sexual activity: Not on file  Other Topics Concern  . Not on file  Social History Narrative  . Not on file     Vitals:   11/19/17 1255  BP: 118/78  Pulse: 71  SpO2: (!) 87%  Weight: 172 lb (78 kg)  Height: 5' 2"  (1.575 m)    Wt Readings from Last 3 Encounters:  11/19/17 172 lb (78 kg)  09/18/17 177 lb 11.1 oz (80.6 kg)  09/01/17 177 lb (80.3 kg)     PHYSICAL EXAM General: NAD HEENT: Normal. Neck: No JVD, no thyromegaly. Lungs: Diminished sounds throughout, no crackles or wheezes. CV: Regular rate and rhythm, normal S1/S2, no S3/S4, no murmur. No pretibial or periankle edema.  No carotid bruit.   Abdomen: Soft, nontender, no distention.  Neurologic: Alert and oriented.  Psych: Normal affect. Skin: Normal. Musculoskeletal: No gross deformities.    ECG: Reviewed above under Subjective   Labs: Lab Results  Component Value Date/Time   K 4.7 09/20/2017 04:38 AM   BUN 23 09/20/2017 04:38 AM   CREATININE 0.70 09/20/2017 04:38 AM   ALT 18 09/17/2017 07:27 PM   TSH 0.226 (L) 09/23/2016 05:42 AM   HGB 12.2 09/21/2017 06:08 AM     Lipids: Lab Results  Component Value Date/Time   LDLCALC 102 (H) 03/23/2015 04:20 AM   CHOL 204 (H) 03/23/2015 04:20 AM   TRIG 86 03/23/2015 04:20 AM   HDL 85 03/23/2015 04:20 AM       ASSESSMENT AND PLAN:  1.  Paroxysmal atrial flutter and fibrillation: Symptomatically stable and in a regular rhythm. ContinueXarelto 20 mg daily, metoprolol 50 mg twice daily, and long-acting diltiazem 240 mg daily.  2. Essential  HTN: Controlled. No changes.   Disposition: Follow up 1 year   Kate Sable, M.D., F.A.C.C.

## 2017-11-19 NOTE — Patient Instructions (Signed)

## 2017-11-20 ENCOUNTER — Other Ambulatory Visit: Payer: Self-pay | Admitting: *Deleted

## 2017-11-20 MED ORDER — RIVAROXABAN 20 MG PO TABS: 20.0000 mg | ORAL_TABLET | Freq: Every day | ORAL | 0 refills | Status: DC

## 2017-11-21 ENCOUNTER — Other Ambulatory Visit: Payer: Self-pay | Admitting: Pharmacy Technician

## 2017-11-21 NOTE — Patient Outreach (Signed)
[facility]) Care Management  11/21/2017  Eileen [name] May 06, 1945 [id number]   Received patient portion of patient assistance applications for Trelegy, Ventolin and Xarelto. Faxed completed applications and required documents to [employer] and [place] patient assistance.  Will follow up with [employer] (Trelegy, Ventolin) to check status of application in 2-3 business days.  Will follow up with [employer] in 7-10 business days to check status of application.  Maud Deed [place], [facility] Management [phone]

## 2017-11-26 ENCOUNTER — Ambulatory Visit: Payer: Self-pay | Admitting: Pharmacist

## 2017-11-27 ENCOUNTER — Other Ambulatory Visit: Payer: Self-pay | Admitting: Pharmacy Technician

## 2017-11-27 NOTE — Patient Outreach (Signed)
[facility]) Care Management  11/27/2017  Eileen [name] [birth year] [id number]   Follow up call to [name] to check status of patients application for Trelegy and Ventolin. Joellyn Quails confirmed patient has been denied due to not meeting the $600 OOP spending requirement.  Will follow up with patients pharmacy in 2-3 business days to check for updated spending amount.  Maud Deed [place], [place] Management [phone]

## 2017-11-29 ENCOUNTER — Ambulatory Visit: Payer: Self-pay | Admitting: Pharmacist

## 2017-11-29 DIAGNOSIS — L84 Corns and callosities: Secondary | ICD-10-CM | POA: Diagnosis not present

## 2017-11-29 DIAGNOSIS — E1142 Type 2 diabetes mellitus with diabetic polyneuropathy: Secondary | ICD-10-CM | POA: Diagnosis not present

## 2017-11-29 DIAGNOSIS — M79676 Pain in unspecified toe(s): Secondary | ICD-10-CM | POA: Diagnosis not present

## 2017-11-29 DIAGNOSIS — B351 Tinea unguium: Secondary | ICD-10-CM | POA: Diagnosis not present

## 2017-12-06 ENCOUNTER — Ambulatory Visit: Payer: Self-pay | Admitting: Pharmacist

## 2017-12-07 ENCOUNTER — Other Ambulatory Visit: Payer: Self-pay | Admitting: Pharmacist

## 2017-12-07 NOTE — Patient Outreach (Addendum)
[facility]) Care Management  12/07/2017  [name] Diers August 20, 1945 [id number]   Successful outreach call to Ms. Berline Lopes regarding patient assistance with HIPAA identifiers verified.  Patient recently denied for PAPs ([employer] and [employer]) due to TROOP status.  She may not meet OOP for [employer] (Xarelto), however she is very close to meeting $600 OOP for [place].  Patient states that she just went to pharmacy yesterday to pick up an inhaler.  She states she is doing well overall.  PLAN: -I will reach out to [place], Etter Sjogren to assist in checking the status of OOP.  Appreciate assistance -I will f/u within 1 week (or sooner if new information received) to see if patient is closer to meeting TROOP for PAPs   Regina Eck, PharmD, [place]  [phone]

## 2017-12-10 ENCOUNTER — Ambulatory Visit: Payer: Self-pay | Admitting: Pharmacist

## 2017-12-10 DIAGNOSIS — R0902 Hypoxemia: Secondary | ICD-10-CM | POA: Diagnosis not present

## 2017-12-10 DIAGNOSIS — J449 Chronic obstructive pulmonary disease, unspecified: Secondary | ICD-10-CM | POA: Diagnosis not present

## 2017-12-11 ENCOUNTER — Other Ambulatory Visit: Payer: Self-pay | Admitting: Pharmacy Technician

## 2017-12-11 ENCOUNTER — Ambulatory Visit: Payer: Self-pay | Admitting: Pharmacist

## 2017-12-11 NOTE — Patient Outreach (Signed)
[facility]) Care Management  12/11/2017  Eileen [name] 12-12-1945 [id number]   Follow up call to check status of patients application for Trelegy and Ventolin HFA. Mikle Bosworth confirmed that patient has been approved as of 12/11/17 until 03/12/2018. Patient should received medication in the next 14 business days.  Will follow up with patient in 14-17 business days to confirm medication has been received.  Maud Deed [place], [place] Management [phone]

## 2017-12-17 ENCOUNTER — Ambulatory Visit: Payer: Self-pay | Admitting: Pharmacist

## 2017-12-20 ENCOUNTER — Other Ambulatory Visit: Payer: Self-pay | Admitting: Pharmacist

## 2017-12-20 ENCOUNTER — Ambulatory Visit: Payer: Self-pay | Admitting: Pharmacist

## 2017-12-20 NOTE — Patient Outreach (Signed)
[facility]) Care Management       [place] 12/20/2017  Eileen [name] 1945/12/08 [id number]   Incoming call from [name] with HIPAA identifiers verified.  Patient was joyful and thankful that her Trelegy and Ventolin HFA inhalers have arrived!  She continues to praise the efforts of [facility] Care Management.    PLAN: -I will alert [facility] CPhT, Etter Sjogren of today's findings -I will close this case.  Patient has been provided [facility] CM contact information if assistance needed in the future.    Thank you for allowing [facility] pharmacy to be involved in this patient's care.    Regina Eck, PharmD, [place]  [phone]

## 2018-01-03 DIAGNOSIS — I4892 Unspecified atrial flutter: Secondary | ICD-10-CM | POA: Diagnosis not present

## 2018-01-03 DIAGNOSIS — I480 Paroxysmal atrial fibrillation: Secondary | ICD-10-CM | POA: Diagnosis not present

## 2018-01-03 DIAGNOSIS — J449 Chronic obstructive pulmonary disease, unspecified: Secondary | ICD-10-CM | POA: Diagnosis not present

## 2018-01-03 DIAGNOSIS — K921 Melena: Secondary | ICD-10-CM | POA: Diagnosis not present

## 2018-01-10 ENCOUNTER — Other Ambulatory Visit: Payer: Self-pay | Admitting: Cardiovascular Disease

## 2018-01-10 DIAGNOSIS — R0902 Hypoxemia: Secondary | ICD-10-CM | POA: Diagnosis not present

## 2018-01-10 DIAGNOSIS — J449 Chronic obstructive pulmonary disease, unspecified: Secondary | ICD-10-CM | POA: Diagnosis not present

## 2018-01-14 ENCOUNTER — Other Ambulatory Visit: Payer: Self-pay

## 2018-01-14 NOTE — Patient Outreach (Signed)
[facility] ([facility]) Care Management  01/14/2018   Eileen [name] 01/17/1946 [id number]  Subjective: Successful call to the patient for assessment. HIPAA verified.  The patient states that she has been doing a little better.  She denies any pain, wheezing, cough or falls.  She states that she has shortness of breath with exertion, but that is not unusual for her.  She states that she has received her flu shot the second week in October.  She is using her medications as prescribed.  She was happy that [facility]  pharmacy was able to help her with her Trelegy and Ventolin inhalers. I discussed with the patient the signs and symptoms of COPD and COPD action plan.  She states that she understands.  The patient states that she has an appointment with her pulmonologist  on the 21st of this month.   Current Medications:  Current Outpatient Medications  Medication Sig Dispense Refill  . acetaminophen (TYLENOL) 500 MG tablet Take 500 mg by mouth every 8 (eight) hours as needed for mild pain or headache.     . albuterol (PROVENTIL) (2.5 MG/3ML) 0.083% nebulizer solution Take 2.5 mg by nebulization every 6 (six) hours as needed for wheezing or shortness of breath.    . Ascorbic Acid (VITAMIN C PO) Take 1 tablet by mouth daily.    . blood glucose meter kit and supplies KIT Dispense based on patient and insurance preference. Use up to four times daily as directed. (FOR ICD-9 250.00, 250.01).  Check sugar before meals and at night, keep a log. 1 each 0  . diltiazem (CARDIZEM CD) 240 MG 24 hr capsule TAKE ONE CAPSULE BY MOUTH DAILY. 30 capsule 3  . guaiFENesin (MUCINEX) 600 MG 12 hr tablet Take 1 tablet (600 mg total) by mouth 2 (two) times daily. 30 tablet 0  . metFORMIN (GLUCOPHAGE-XR) 500 MG 24 hr tablet Take 500 mg by mouth daily.  1  . metoprolol tartrate (LOPRESSOR) 50 MG tablet Take 1 tablet (50 mg total) by mouth 2 (two) times daily. 30 tablet 0  . Multiple Vitamins-Minerals (EMERGEN-C VITAMIN C  PO) Take 1 Package by mouth daily.    . OXYGEN Inhale 2.5-3 L into the lungs daily. At night and daily as needed    . predniSONE (DELTASONE) 10 MG tablet Take 40mg po daily for 2 days then 30mg daily for 2 days then 20mg daily for 2 days then 10mg daily 30 tablet 1  . PROAIR HFA 108 (90 BASE) MCG/ACT inhaler Inhale 2 puffs into the lungs every 6 (six) hours as needed for wheezing or shortness of breath.     . rivaroxaban (XARELTO) 20 MG TABS tablet Take 1 tablet (20 mg total) by mouth daily with supper. 21 tablet 0  . TRELEGY ELLIPTA 100-62.5-25 MCG/INH AEPB Take 1 puff by mouth daily.  0  . Vitamin D, Ergocalciferol, (DRISDOL) 50000 units CAPS capsule Take 50,000 Units every 30 (thirty) days by mouth.     No current facility-administered medications for this visit.     Functional Status:  In your present state of health, do you have any difficulty performing the following activities: 10/10/2017 09/17/2017  Hearing? N N  Vision? N N  Difficulty concentrating or making decisions? N N  Walking or climbing stairs? Y Y  Comment - pt gets short of breath when walking and climbing the stairs  Dressing or bathing? N N  Doing errands, shopping? N N  Some recent data might be hidden      Fall/Depression Screening: Fall Risk  01/14/2018 11/14/2017 10/10/2017  Falls in the past year? 0 No No  Number falls in past yr: - - -  Injury with Fall? - - -  Risk for fall due to : - - -  Follow up - - -   PHQ 2/9 Scores 10/10/2017 10/03/2016  PHQ - 2 Score 2 0  PHQ- 9 Score 6 -    Assessment: Patient will continue to benefit from health coach outreach for disease management and support.  [facility] CM Care Plan Problem One     Most Recent Value  [facility] Long Term Goal   In 30 days the patient verbalize no admission for copd exacerbation  [facility] Long Term Goal Start Date  01/14/18  Interventions for Problem One Long Term Goal  Talked about signs and syptoms of copd, copd action plan, and medication adherence.       Plan: RN Health Coach will contact patient in the month of December and patient agrees to next outreach.  [name] RN, BSN, [place] Direct Dial:  [phone]  Fax: [phone]

## 2018-01-20 ENCOUNTER — Encounter (HOSPITAL_COMMUNITY): Payer: Self-pay | Admitting: Emergency Medicine

## 2018-01-20 ENCOUNTER — Inpatient Hospital Stay (HOSPITAL_COMMUNITY)
Admission: EM | Admit: 2018-01-20 | Discharge: 2018-01-23 | DRG: 394 | Disposition: A | Discharge status: Home / Self Care | Payer: Medicare Other | Attending: Family Medicine | Admitting: Family Medicine

## 2018-01-20 ENCOUNTER — Emergency Department (HOSPITAL_COMMUNITY): Payer: Medicare Other

## 2018-01-20 ENCOUNTER — Other Ambulatory Visit: Payer: Self-pay

## 2018-01-20 DIAGNOSIS — Z79899 Other long term (current) drug therapy: Secondary | ICD-10-CM | POA: Diagnosis not present

## 2018-01-20 DIAGNOSIS — K5730 Diverticulosis of large intestine without perforation or abscess without bleeding: Secondary | ICD-10-CM | POA: Diagnosis not present

## 2018-01-20 DIAGNOSIS — K644 Residual hemorrhoidal skin tags: Secondary | ICD-10-CM | POA: Diagnosis not present

## 2018-01-20 DIAGNOSIS — T380X5A Adverse effect of glucocorticoids and synthetic analogues, initial encounter: Secondary | ICD-10-CM | POA: Diagnosis present

## 2018-01-20 DIAGNOSIS — K429 Umbilical hernia without obstruction or gangrene: Secondary | ICD-10-CM | POA: Diagnosis not present

## 2018-01-20 DIAGNOSIS — Z7901 Long term (current) use of anticoagulants: Secondary | ICD-10-CM | POA: Diagnosis not present

## 2018-01-20 DIAGNOSIS — Z7984 Long term (current) use of oral hypoglycemic drugs: Secondary | ICD-10-CM | POA: Diagnosis not present

## 2018-01-20 DIAGNOSIS — Z87891 Personal history of nicotine dependence: Secondary | ICD-10-CM | POA: Diagnosis not present

## 2018-01-20 DIAGNOSIS — E119 Type 2 diabetes mellitus without complications: Secondary | ICD-10-CM | POA: Diagnosis not present

## 2018-01-20 DIAGNOSIS — K922 Gastrointestinal hemorrhage, unspecified: Secondary | ICD-10-CM | POA: Diagnosis present

## 2018-01-20 DIAGNOSIS — J441 Chronic obstructive pulmonary disease with (acute) exacerbation: Secondary | ICD-10-CM | POA: Diagnosis present

## 2018-01-20 DIAGNOSIS — K5900 Constipation, unspecified: Secondary | ICD-10-CM | POA: Diagnosis present

## 2018-01-20 DIAGNOSIS — Z9981 Dependence on supplemental oxygen: Secondary | ICD-10-CM | POA: Diagnosis not present

## 2018-01-20 DIAGNOSIS — Z7951 Long term (current) use of inhaled steroids: Secondary | ICD-10-CM

## 2018-01-20 DIAGNOSIS — Z9011 Acquired absence of right breast and nipple: Secondary | ICD-10-CM | POA: Diagnosis not present

## 2018-01-20 DIAGNOSIS — K921 Melena: Secondary | ICD-10-CM | POA: Diagnosis not present

## 2018-01-20 DIAGNOSIS — I10 Essential (primary) hypertension: Secondary | ICD-10-CM | POA: Diagnosis present

## 2018-01-20 DIAGNOSIS — I959 Hypotension, unspecified: Secondary | ICD-10-CM | POA: Diagnosis present

## 2018-01-20 DIAGNOSIS — D72828 Other elevated white blood cell count: Secondary | ICD-10-CM | POA: Diagnosis present

## 2018-01-20 DIAGNOSIS — I480 Paroxysmal atrial fibrillation: Secondary | ICD-10-CM | POA: Diagnosis not present

## 2018-01-20 DIAGNOSIS — K625 Hemorrhage of anus and rectum: Secondary | ICD-10-CM | POA: Diagnosis not present

## 2018-01-20 DIAGNOSIS — Z853 Personal history of malignant neoplasm of breast: Secondary | ICD-10-CM

## 2018-01-20 DIAGNOSIS — Z833 Family history of diabetes mellitus: Secondary | ICD-10-CM

## 2018-01-20 DIAGNOSIS — R918 Other nonspecific abnormal finding of lung field: Secondary | ICD-10-CM | POA: Diagnosis not present

## 2018-01-20 DIAGNOSIS — K648 Other hemorrhoids: Principal | ICD-10-CM | POA: Diagnosis present

## 2018-01-20 HISTORY — DX: Malignant (primary) neoplasm, unspecified: C80.1

## 2018-01-20 LAB — CBC WITH DIFFERENTIAL/PLATELET
Abs Immature Granulocytes: 0.07 10*3/uL (ref 0.00–0.07)
BASOS PCT: 0 %
Basophils Absolute: 0.1 10*3/uL (ref 0.0–0.1)
EOS ABS: 0.1 10*3/uL (ref 0.0–0.5)
EOS PCT: 1 %
HCT: 36.6 % (ref 36.0–46.0)
Hemoglobin: 11.1 g/dL — ABNORMAL LOW (ref 12.0–15.0)
Immature Granulocytes: 1 %
LYMPHS PCT: 18 %
Lymphs Abs: 2.8 10*3/uL (ref 0.7–4.0)
MCH: 29.8 pg (ref 26.0–34.0)
MCHC: 30.3 g/dL (ref 30.0–36.0)
MCV: 98.1 fL (ref 80.0–100.0)
Monocytes Absolute: 1.4 10*3/uL — ABNORMAL HIGH (ref 0.1–1.0)
Monocytes Relative: 9 %
NEUTROS ABS: 10.9 10*3/uL — AB (ref 1.7–7.7)
Neutrophils Relative %: 71 %
PLATELETS: 244 10*3/uL (ref 150–400)
RBC: 3.73 MIL/uL — AB (ref 3.87–5.11)
RDW: 13.4 % (ref 11.5–15.5)
WBC: 15.4 10*3/uL — ABNORMAL HIGH (ref 4.0–10.5)
nRBC: 0 % (ref 0.0–0.2)

## 2018-01-20 LAB — CBC
HCT: 31.3 % — ABNORMAL LOW (ref 36.0–46.0)
HEMOGLOBIN: 9.2 g/dL — AB (ref 12.0–15.0)
MCH: 28.8 pg (ref 26.0–34.0)
MCHC: 29.4 g/dL — ABNORMAL LOW (ref 30.0–36.0)
MCV: 98.1 fL (ref 80.0–100.0)
NRBC: 0 % (ref 0.0–0.2)
PLATELETS: 250 10*3/uL (ref 150–400)
RBC: 3.19 MIL/uL — AB (ref 3.87–5.11)
RDW: 13.5 % (ref 11.5–15.5)
WBC: 12 10*3/uL — AB (ref 4.0–10.5)

## 2018-01-20 LAB — CBG MONITORING, ED
GLUCOSE-CAPILLARY: 264 mg/dL — AB (ref 70–99)
Glucose-Capillary: 191 mg/dL — ABNORMAL HIGH (ref 70–99)

## 2018-01-20 LAB — GLUCOSE, CAPILLARY: GLUCOSE-CAPILLARY: 146 mg/dL — AB (ref 70–99)

## 2018-01-20 LAB — COMPREHENSIVE METABOLIC PANEL
ALBUMIN: 3.9 g/dL (ref 3.5–5.0)
ALT: 14 U/L (ref 0–44)
ANION GAP: 9 (ref 5–15)
AST: 21 U/L (ref 15–41)
Alkaline Phosphatase: 52 U/L (ref 38–126)
BILIRUBIN TOTAL: 0.5 mg/dL (ref 0.3–1.2)
BUN: 15 mg/dL (ref 8–23)
CHLORIDE: 100 mmol/L (ref 98–111)
CO2: 31 mmol/L (ref 22–32)
Calcium: 9.3 mg/dL (ref 8.9–10.3)
Creatinine, Ser: 0.71 mg/dL (ref 0.44–1.00)
GFR calc Af Amer: >60 mL/min (ref >60)
GFR calc non Af Amer: >60 mL/min (ref >60)
GLUCOSE: 193 mg/dL — AB (ref 70–99)
POTASSIUM: 4.2 mmol/L (ref 3.5–5.1)
SODIUM: 140 mmol/L (ref 135–145)
Total Protein: 6.4 g/dL — ABNORMAL LOW (ref 6.5–8.1)

## 2018-01-20 LAB — PROTIME-INR
INR: 1.45
Prothrombin Time: 17.4 seconds — ABNORMAL HIGH (ref 11.4–15.2)

## 2018-01-20 LAB — I-STAT TROPONIN, ED: Troponin i, poc: 0 ng/mL (ref 0.00–0.08)

## 2018-01-20 LAB — BRAIN NATRIURETIC PEPTIDE: B NATRIURETIC PEPTIDE 5: 107 pg/mL — AB (ref 0.0–100.0)

## 2018-01-20 MED ORDER — IPRATROPIUM-ALBUTEROL 0.5-2.5 (3) MG/3ML IN SOLN: 3.0000 mL | RESPIRATORY_TRACT | Status: DC | PRN

## 2018-01-20 MED ORDER — SODIUM CHLORIDE 0.9 % IV BOLUS
500.0000 mL | Freq: Once | INTRAVENOUS | Status: AC
  Administered 2018-01-20: 500 mL via INTRAVENOUS

## 2018-01-20 MED ORDER — INSULIN ASPART 100 UNIT/ML ~~LOC~~ SOLN
0.0000 [IU] | Freq: Three times a day (TID) | SUBCUTANEOUS | Status: DC
  Administered 2018-01-20: 3 [IU] via SUBCUTANEOUS
  Administered 2018-01-21 (×3): 2 [IU] via SUBCUTANEOUS
  Administered 2018-01-22: 5 [IU] via SUBCUTANEOUS
  Administered 2018-01-22: 2 [IU] via SUBCUTANEOUS
  Administered 2018-01-23: 3 [IU] via SUBCUTANEOUS
  Administered 2018-01-23: 2 [IU] via SUBCUTANEOUS
  Filled 2018-01-20: qty 1

## 2018-01-20 MED ORDER — ALBUTEROL SULFATE (2.5 MG/3ML) 0.083% IN NEBU
2.5000 mg | INHALATION_SOLUTION | Freq: Once | RESPIRATORY_TRACT | Status: AC
  Administered 2018-01-20: 2.5 mg via RESPIRATORY_TRACT
  Filled 2018-01-20: qty 3

## 2018-01-20 MED ORDER — VITAMIN C 500 MG PO TABS
500.0000 mg | ORAL_TABLET | Freq: Every day | ORAL | Status: DC
  Administered 2018-01-20 – 2018-01-23 (×4): 500 mg via ORAL
  Filled 2018-01-20 (×5): qty 1

## 2018-01-20 MED ORDER — LEVALBUTEROL HCL 0.63 MG/3ML IN NEBU: 0.6300 mg | INHALATION_SOLUTION | Freq: Four times a day (QID) | RESPIRATORY_TRACT | Status: DC | PRN

## 2018-01-20 MED ORDER — BUDESONIDE 0.25 MG/2ML IN SUSP
0.2500 mg | Freq: Two times a day (BID) | RESPIRATORY_TRACT | Status: DC
  Administered 2018-01-20 – 2018-01-23 (×6): 0.25 mg via RESPIRATORY_TRACT
  Filled 2018-01-20 (×8): qty 2

## 2018-01-20 MED ORDER — DILTIAZEM HCL ER COATED BEADS 240 MG PO CP24
240.0000 mg | ORAL_CAPSULE | Freq: Every day | ORAL | Status: DC
  Administered 2018-01-20 – 2018-01-23 (×4): 240 mg via ORAL
  Filled 2018-01-20: qty 1
  Filled 2018-01-20: qty 2
  Filled 2018-01-20 (×3): qty 1

## 2018-01-20 MED ORDER — UMECLIDINIUM-VILANTEROL 62.5-25 MCG/INH IN AEPB
1.0000 | INHALATION_SPRAY | Freq: Every day | RESPIRATORY_TRACT | Status: DC
  Administered 2018-01-23: 1 via RESPIRATORY_TRACT
  Filled 2018-01-20 (×2): qty 14

## 2018-01-20 MED ORDER — METHYLPREDNISOLONE SODIUM SUCC 125 MG IJ SOLR
60.0000 mg | Freq: Two times a day (BID) | INTRAMUSCULAR | Status: DC
  Administered 2018-01-20 – 2018-01-21 (×4): 60 mg via INTRAVENOUS
  Filled 2018-01-20 (×4): qty 2

## 2018-01-20 MED ORDER — FLUTICASONE-UMECLIDIN-VILANT 100-62.5-25 MCG/INH IN AEPB: 1.0000 | INHALATION_SPRAY | Freq: Every day | RESPIRATORY_TRACT | Status: DC

## 2018-01-20 MED ORDER — ALBUTEROL SULFATE (2.5 MG/3ML) 0.083% IN NEBU: 2.5000 mg | INHALATION_SOLUTION | Freq: Four times a day (QID) | RESPIRATORY_TRACT | Status: DC | PRN

## 2018-01-20 MED ORDER — VITAMIN D (ERGOCALCIFEROL) 1.25 MG (50000 UNIT) PO CAPS
50000.0000 [IU] | ORAL_CAPSULE | ORAL | Status: DC
  Filled 2018-01-20: qty 1

## 2018-01-20 MED ORDER — POLYETHYLENE GLYCOL 3350 17 G PO PACK
17.0000 g | PACK | ORAL | Status: AC
  Administered 2018-01-20 (×3): 17 g via ORAL
  Filled 2018-01-20 (×2): qty 1

## 2018-01-20 MED ORDER — SODIUM CHLORIDE 0.9 % IV SOLN
INTRAVENOUS | Status: DC
  Administered 2018-01-20 – 2018-01-21 (×3): via INTRAVENOUS

## 2018-01-20 MED ORDER — METHYLPREDNISOLONE SODIUM SUCC 125 MG IJ SOLR
125.0000 mg | Freq: Once | INTRAMUSCULAR | Status: AC
  Administered 2018-01-20: 125 mg via INTRAVENOUS
  Filled 2018-01-20: qty 2

## 2018-01-20 MED ORDER — LEVALBUTEROL HCL 0.63 MG/3ML IN NEBU
0.6300 mg | INHALATION_SOLUTION | Freq: Three times a day (TID) | RESPIRATORY_TRACT | Status: DC
  Administered 2018-01-21 – 2018-01-23 (×6): 0.63 mg via RESPIRATORY_TRACT
  Filled 2018-01-20 (×6): qty 3

## 2018-01-20 MED ORDER — GUAIFENESIN ER 600 MG PO TB12
600.0000 mg | ORAL_TABLET | Freq: Two times a day (BID) | ORAL | Status: DC
  Administered 2018-01-20 – 2018-01-23 (×7): 600 mg via ORAL
  Filled 2018-01-20 (×9): qty 1

## 2018-01-20 MED ORDER — HYDROCORTISONE 2.5 % RE CREA
TOPICAL_CREAM | Freq: Four times a day (QID) | RECTAL | Status: DC
  Administered 2018-01-21: 15:00:00 via RECTAL
  Administered 2018-01-21: 1 via RECTAL
  Administered 2018-01-21 – 2018-01-22 (×4): via RECTAL
  Administered 2018-01-23: 1 via RECTAL
  Filled 2018-01-20 (×2): qty 28.35

## 2018-01-20 MED ORDER — SODIUM CHLORIDE 0.9 % IV SOLN
500.0000 mg | INTRAVENOUS | Status: DC
  Administered 2018-01-20 – 2018-01-21 (×2): 500 mg via INTRAVENOUS
  Filled 2018-01-20 (×5): qty 500

## 2018-01-20 MED ORDER — ACETAMINOPHEN 500 MG PO TABS: 500.0000 mg | ORAL_TABLET | Freq: Three times a day (TID) | ORAL | Status: DC | PRN

## 2018-01-20 MED ORDER — ORAL CARE MOUTH RINSE
15.0000 mL | Freq: Two times a day (BID) | OROMUCOSAL | Status: DC
  Administered 2018-01-21 – 2018-01-22 (×3): 15 mL via OROMUCOSAL

## 2018-01-20 MED ORDER — IPRATROPIUM-ALBUTEROL 0.5-2.5 (3) MG/3ML IN SOLN
3.0000 mL | Freq: Once | RESPIRATORY_TRACT | Status: AC
  Administered 2018-01-20: 3 mL via RESPIRATORY_TRACT
  Filled 2018-01-20: qty 3

## 2018-01-20 MED ORDER — POLYETHYLENE GLYCOL 3350 17 G PO PACK
17.0000 g | PACK | ORAL | Status: DC
  Administered 2018-01-20 (×3): 17 g via ORAL
  Filled 2018-01-20 (×3): qty 1

## 2018-01-20 NOTE — Progress Notes (Addendum)
Patient ID: Eileen [name], female   DOB: 1945/07/08, 72 y.o.   MRN: [id number]  Referring Provider: No ref. provider found Primary Care Physician:  Lavella Lemons, PA Primary Gastroenterologist:  DR. Laural Golden  Reason for Consultation:  RECTAL BLEEDING   Impression: ADMITTED WITH RECTAL BLEEDING WITH MILD LOWER ABDOMINAL PAIN. DIFFERENTIAL DIAGNOSIS FOR ETIOLOGY INCLUDES HEMORRHOIDS, STERCORAL ULCER, COLON POLYPS, AVMs, DIVERTICULOSIS, & LESS LIKELY COLON CA. LAST XARELTO SAT NIGHT @ 6 PM.   Plan: 1. SUPPORTIVE CARE. 2. FULL LIQUID TODAY. [place] NOV 11. TCS NOV 12. 4. ANUSOL CREAM PR QID 5. MIRALAX Q1H x 6 DOSES TODAY      HPI:  HAVING BRBPR FOR 1-2 MOS. MON IT GOT HEAVIER(CHANGED COLOR SAT NIGHT DOT OF RED THEN MAROON). ON SAT SAW BLOOD 3-4 TIMES(MODERTAE TO HEAVY). SINCE BEING IN ED: TWO TIMES. SAW FORMED STOOL MIXED WITH BLOOD. FEELS HOT. SOB: SAME AS USUAL. CONSTIPATION: TAKES SOMETHING EVERY OTHER DAY TO HAVE A BM AT LEAST 1-2 TIMES A WEEK. HAVING MILD CRAMPY ON BOTTOM FROM LEFT TO RIGHT. HAD COLONOSCOPY IN 2012. HB ~11-12 SINCE JUL 2019. BEEN ON XARELTO OVER ONE YR FOR A FLUTTER. NO RECTAL PRESSURE, PAIN, ITCHING, BURNING, OR SOILING. FEELS HEMORRHOIDS HANGING OUT AND OCCASIONALLY THEY POP BACK IN.   PT DENIES FEELING LIGHTHEADED OR DIZZY,  FEVER, CHILLS, HEMATEMESIS, nausea, vomiting, melena, diarrhea, CHEST PAIN, CHANGE IN BOWEL IN HABITS, constipation, problems swallowing, problems with sedation, OR  heartburn or indigestion.  Past Medical History:  Diagnosis Date  . Asthma   . Atrial flutter ([place])   . COPD (chronic obstructive pulmonary disease) ([place])   . History of breast cancer    right breast  . Hypertension   . Type 2 diabetes mellitus ([place])     Past Surgical History:  Procedure Laterality Date  . CESAREAN SECTION    . HERNIA REPAIR     RIH  . MASTECTOMY  2011   right breast    Prior to Admission medications    Medication Sig Start Date End Date Taking? Authorizing Provider  acetaminophen (TYLENOL) 500 MG tablet Take 500 mg by mouth every 8 (eight) hours as needed for mild pain or headache.    Yes Lavella Lemons, PA  albuterol (PROVENTIL) (2.5 MG/3ML) 0.083% nebulizer solution Take 2.5 mg by nebulization every 6 (six) hours as needed for wheezing or shortness of breath.   Yes [provider]  Ascorbic Acid (VITAMIN C PO) Take 1 tablet by mouth daily.   Yes [provider]  blood glucose meter kit and supplies KIT Dispense based on patient and insurance preference. Use up to four times daily as directed. (FOR ICD-9 250.00, 250.01).  Check sugar before meals and at night, keep a log. 09/29/16  Yes Arrien, Jimmy Picket, MD  diltiazem (CARDIZEM CD) 240 MG 24 hr capsule TAKE ONE CAPSULE BY MOUTH DAILY. 01/11/18  Yes Herminio Commons, MD  guaiFENesin (MUCINEX) 600 MG 12 hr tablet Take 1 tablet (600 mg total) by mouth 2 (two) times daily. 09/21/17  Yes Kathie Dike, MD  metFORMIN (GLUCOPHAGE-XR) 500 MG 24 hr tablet Take 500 mg by mouth daily. 08/10/17  Yes [provider]  metoprolol tartrate (LOPRESSOR) 50 MG tablet Take 1 tablet (50 mg total) by mouth 2 (two) times daily. 09/29/16  Yes Arrien, Jimmy Picket, MD  OXYGEN Inhale 2.5-3 L into the lungs daily. At night and daily as needed   Yes [provider]  predniSONE (DELTASONE) 10 MG tablet Take 82m po daily for 2 days then 331mdaily for 2 days then 2026maily for 2 days then 47m54mily 09/21/17  Yes MemoKathie Dike  PROAIR HFA 108 (90 BASE) MCG/ACT inhaler Inhale 2 puffs into the lungs every 6 (six) hours as needed for wheezing or shortness of breath.  03/06/11  Yes [provider]  rivaroxaban (XARELTO) 20 MG TABS tablet Take 1 tablet (20 mg total) by mouth daily with supper. 11/20/17  Yes KoneHerminio Commons  telmisartan-hydrochlorothiazide (MICARDIS HCT) 80-12.5 MG tablet Take 1 tablet by mouth  daily.  03/18/15  Yes [provider]  TRELEGY ELLIPTA 100-62.5-25 MCG/INH AEPB Take 1 puff by mouth daily. 08/27/17  Yes [provider]  Vitamin D, Ergocalciferol, (DRISDOL) 50000 units CAPS capsule Take 50,000 Units every 30 (thirty) days by mouth.    [provider]    Current Facility-Administered Medications  Medication Dose Route Frequency Provider Last Rate Last Dose  . 0.9 %  sodium chloride infusion   Intravenous Continuous [place]      . acetaminophen (TYLENOL) tablet 500 mg  500 mg Oral Q8H PRN UgahCristal [place]      . albuterol (PROVENTIL) (2.5 MG/3ML) 0.083% nebulizer solution 2.5 mg  2.5 mg Nebulization Q6H PRN UgahCristal [place]      . ascorbic acid (VITAMIN C) 500 MG/5ML syrup 500 mg  500 mg Oral Daily UgahCristal [place]      . azithromycin (ZITHROMAX) 500 mg in sodium chloride 0.9 % 250 mL IVPB  500 mg Intravenous Q24H UgahCristal [place]      . diltiazem (CARDIZEM CD) 24 hr capsule 240 mg  240 mg Oral Daily UgahCristal [place]      . Fluticasone-Umeclidin-Vilant 100-62.5-25 MCG/INH AEPB 1 puff  1 puff Oral Daily UgahCristal [place]      . guaiFENesin (MUCINEX) 12 hr tablet 600 mg  600 mg Oral BID UgahCristal [place]      . insulin aspart (novoLOG) injection 0-9 Units  0-9 Units Subcutaneous TID WC UgahCristal [place]er      . ipratropium-albuterol (DUONEB) 0.5-2.5 (3) MG/3ML nebulizer solution 3 mL  3 mL Nebulization Q4H PRN UgahCristal [place]      . methylPREDNISolone sodium succinate (SOLU-MEDROL) 125 mg/2 mL injection 60 mg  60 mg Intravenous Q12H UgahCristal [place]      . Vitamin D (Ergocalciferol) (DRISDOL) capsule 50,000 Units  50,000 Units Oral Q30 days UgahCristal [place]er       Current Outpatient Medications  Medication Sig Dispense Refill  . acetaminophen (TYLENOL) 500 MG tablet Take 500 mg by mouth every 8 (eight) hours as needed for mild pain or headache.     . albuterol (PROVENTIL) (2.5 MG/3ML) 0.083% nebulizer  solution Take 2.5 mg by nebulization every 6 (six) hours as needed for wheezing or shortness of breath.    . Ascorbic Acid (VITAMIN C PO) Take 1 tablet by mouth daily.    . blood glucose meter kit and supplies KIT Dispense based on patient and insurance preference. Use up to four times daily as directed. (FOR ICD-9 250.00, 250.01).  Check sugar before meals and at night, keep a log. 1 each 0  . diltiazem (CARDIZEM CD) 240 MG 24 hr capsule TAKE ONE CAPSULE BY MOUTH DAILY. 30 capsule 3  . guaiFENesin (MUCINEX) 600 MG 12 hr tablet Take 1 tablet (600 mg total) by mouth 2 (two) times daily. 30 tablet  0  . metFORMIN (GLUCOPHAGE-XR) 500 MG 24 hr tablet Take 500 mg by mouth daily.  1  . metoprolol tartrate (LOPRESSOR) 50 MG tablet Take 1 tablet (50 mg total) by mouth 2 (two) times daily. 30 tablet 0  . OXYGEN Inhale 2.5-3 L into the lungs daily. At night and daily as needed    . predniSONE (DELTASONE) 10 MG tablet Take 86m po daily for 2 days then 345mdaily for 2 days then 2052maily for 2 days then 45m54mily 30 tablet 1  . PROAIR HFA 108 (90 BASE) MCG/ACT inhaler Inhale 2 puffs into the lungs every 6 (six) hours as needed for wheezing or shortness of breath.     . rivaroxaban (XARELTO) 20 MG TABS tablet Take 1 tablet (20 mg total) by mouth daily with supper. 21 tablet 0  . telmisartan-hydrochlorothiazide (MICARDIS HCT) 80-12.5 MG tablet Take 1 tablet by mouth daily.     . TRELEGY ELLIPTA 100-62.5-25 MCG/INH AEPB Take 1 puff by mouth daily.  0  . Vitamin D, Ergocalciferol, (DRISDOL) 50000 units CAPS capsule Take 50,000 Units every 30 (thirty) days by mouth.      Allergies as of 01/20/2018 - Review Complete 01/20/2018  Allergen Reaction Noted  . Codeine Other (See Comments) 12/20/2014    Family History  Problem Relation Age of Onset  . Cancer Mother        lung  . Diabetes Brother      Social History   Socioeconomic History  . Marital status: Single    Spouse name: Not on file  . Number  of children: Not on file  . Years of education: Not on file  . Highest education level: Not on file  Occupational History  . Not on file  Social Needs  . Financial resource strain: Not on file  . Food insecurity:    Worry: Not on file    Inability: Not on file  . Transportation needs:    Medical: Not on file    Non-medical: Not on file  Tobacco Use  . Smoking status: Former Smoker    Packs/day: 0.50    Years: 32.00    Pack years: 16.00    Types: Cigarettes    Start date: 12/12/1962    Last attempt to quit: 03/13/1994    Years since quitting: 23.8  . Smokeless tobacco: Never Used  Substance and Sexual Activity  . Alcohol use: No    Alcohol/week: 0.0 standard drinks  . Drug use: No  . Sexual activity: Not on file  Lifestyle  . Physical activity:    Days per week: Not on file    Minutes per session: Not on file  . Stress: Not on file  Relationships  . Social connections:    Talks on phone: Not on file    Gets together: Not on file    Attends religious service: Not on file    Active member of club or organization: Not on file    Attends meetings of clubs or organizations: Not on file    Relationship status: Not on file  . Intimate partner violence:    Fear of current or ex partner: Not on file    Emotionally abused: Not on file    Physically abused: Not on file    Forced sexual activity: Not on file  Other Topics Concern  . Not on file  Social History Narrative  . Not on file    Review of Systems: PER HPI OTHERWISE ALL SYSTEMS  ARE NEGATIVE.   Vitals: Blood pressure 97/80, pulse (!) 124, temperature 97.8 F (36.6 C), temperature source Oral, resp. rate (!) 26, height 5' 3"  (1.6 m), weight 80.3 kg, SpO2 93 %.  Physical Exam: General:   Alert,  Well-developed, well-nourished, pleasant and cooperative in NAD Head:  Normocephalic and atraumatic. Eyes:  Sclera clear, no icterus.   Conjunctiva pink. Mouth:  No lesions, dentition normal. Neck:  Supple; no  masses. Lungs:  Clear throughout to auscultation.   No wheezes. No acute distress. Heart:  Regular rate and rhythm; no murmurs, clicks, rubs,  or gallops. Abdomen:  Soft, nontender and nondistended. No masses noted. Normal bowel sounds, without guarding, and without rebound.   Msk:  Symmetrical without gross deformities. Normal posture. Extremities:  Without edema. Neurologic:  Alert and  oriented x4;  NO  NEW FOCAL DEFICITS. Cervical Nodes:  No significant cervical adenopathy. Psych:  Alert and cooperative. Normal mood and affect.   Lab Results: Recent Labs    01/20/18 0609  WBC 15.4*  HGB 11.1*  HCT 36.6  PLT 244   BMET Recent Labs    01/20/18 0609  NA 140  K 4.2  CL 100  CO2 31  GLUCOSE 193*  BUN 15  CREATININE 0.71  CALCIUM 9.3   LFT Recent Labs    01/20/18 0609  PROT 6.4*  ALBUMIN 3.9  AST 21  ALT 14  ALKPHOS 52  BILITOT 0.5     Studies/Results: pCXR NOV 10-NACPD   LOS: 0 days   Bhavik Cabiness  01/20/2018, 2:16 PM

## 2018-01-20 NOTE — ED Notes (Signed)
Attempted to give report-nurse in an isolation room.

## 2018-01-20 NOTE — H&P (Addendum)
History and Physical  Eileen A Nevares KDX:[id number] DOB: Aug 10, 1945 DOA: 01/20/2018  PCP: Lavella Lemons, PA   Chief Complaint: GI bleed  HPI:  Eileen [name] is a 72 year old female who presented to the emergency department this morning with with complaints  of GI bleed for 1 week that got worse this morning with blood clots and feeling generally weak associated with abdominal discomfort.  She had informed her primary care provider about this who had referred her to GI for this coming Friday or Thursday.  Patient denies any prior history of rectal or GI bleed.  She also noticed that she is been having increased shortness of breath with severe dyspnea on exertion and has been having some cough with congestion over the past couple of days she is COPD with dependent oxygen at 2 to 3 L/min at home.  She also has atrial fibrillation and is on Xarelto    ED Course: She was noted to be severely dyspneic and tachycardic and with mild exertion her oxygen went down to 80%.  Patient was hypotensive and IV hydration with 500 mls of normal saline bolus  Review of Systems:  Abdominal discomfort, shortness of breath .blood in stool with clot Negative for fever, visual changes, sore throat, rash, new muscle aches, chest pain,, dysuria, bleeding, n/v/abdominal pain.  Past Medical History:  Diagnosis Date  . Asthma   . Atrial flutter ([place])   . COPD (chronic obstructive pulmonary disease) ([place])   . History of breast cancer    right breast  . Hypertension   . Type 2 diabetes mellitus ([place])     Past Surgical History:  Procedure Laterality Date  . CESAREAN SECTION    . HERNIA REPAIR     RIH  . MASTECTOMY  2011   right breast     reports that she quit smoking about 23 years ago. Her smoking use included cigarettes. She started smoking about 55 years ago. She has a 16.00 pack-year smoking history. She has never used smokeless tobacco. She reports that she does not drink alcohol or use  drugs.  Allergies  Allergen Reactions  . Codeine Other (See Comments)    "jittery"    Family History  Problem Relation Age of Onset  . Cancer Mother        lung  . Diabetes Brother      Prior to Admission medications   Medication Sig Start Date End Date Taking? Authorizing Provider  acetaminophen (TYLENOL) 500 MG tablet Take 500 mg by mouth every 8 (eight) hours as needed for mild pain or headache.     Lavella Lemons, PA  albuterol (PROVENTIL) (2.5 MG/3ML) 0.083% nebulizer solution Take 2.5 mg by nebulization every 6 (six) hours as needed for wheezing or shortness of breath.    [provider]  Ascorbic Acid (VITAMIN C PO) Take 1 tablet by mouth daily.    [provider]  blood glucose meter kit and supplies KIT Dispense based on patient and insurance preference. Use up to four times daily as directed. (FOR ICD-9 250.00, 250.01).  Check sugar before meals and at night, keep a log. 09/29/16   Arrien, Jimmy Picket, MD  diltiazem (CARDIZEM CD) 240 MG 24 hr capsule TAKE ONE CAPSULE BY MOUTH DAILY. 01/11/18   Herminio Commons, MD  guaiFENesin (MUCINEX) 600 MG 12 hr tablet Take 1 tablet (600 mg total) by mouth 2 (two) times daily. 09/21/17   Kathie Dike, MD  metFORMIN (GLUCOPHAGE-XR) 500 MG 24 hr  tablet Take 500 mg by mouth daily. 08/10/17   [provider]  metoprolol tartrate (LOPRESSOR) 50 MG tablet Take 1 tablet (50 mg total) by mouth 2 (two) times daily. 09/29/16   Arrien, Jimmy Picket, MD  Multiple Vitamins-Minerals (EMERGEN-C VITAMIN C PO) Take 1 Package by mouth daily.    [provider]  OXYGEN Inhale 2.5-3 L into the lungs daily. At night and daily as needed    [provider]  predniSONE (DELTASONE) 10 MG tablet Take 80m po daily for 2 days then 367mdaily for 2 days then 2066maily for 2 days then 37m40mily 09/21/17   MemoKathie Dike  PROAIR HFA 108 (90 BASE) MCG/ACT inhaler Inhale 2 puffs into the lungs every 6 (six)  hours as needed for wheezing or shortness of breath.  03/06/11   [provider]  rivaroxaban (XARELTO) 20 MG TABS tablet Take 1 tablet (20 mg total) by mouth daily with supper. 11/20/17   KoneHerminio Commons  TRELEGY ELLIPTA 100-62.5-25 MCG/INH AEPB Take 1 puff by mouth daily. 08/27/17   [provider]  Vitamin D, Ergocalciferol, (DRISDOL) 50000 units CAPS capsule Take 50,000 Units every 30 (thirty) days by mouth.    [provider]    Physical Exam: Vitals:   01/20/18 0515 01/20/18 0655  BP: 104/64   Pulse: (!) 117   Resp: 18   Temp: 97.8 F (36.6 C)   SpO2: 94% 99%    Constitutional:   . Appears calm and comfortable Eyes:  . pupils and irises appear normal . Normal lids and conjunctivae ENMT:  . grossly normal hearing  . Lips appear normal . external ears, nose appear normal . Oropharynx: mucosa, tongue,posterior pharynx appear normal Neck:  . neck appears normal, no masses, normal ROM, supple . no thyromegaly Respiratory:  . CTA bilaterally, no w/r/r.  . Respiratory effort slightly increased with mild exertion. No retractions or accessory muscle use Cardiovascular:  . RRR, no m/r/g . No LE extremity edema   . Normal pedal pulses Abdomen:  . Abdomen appears normal; no tenderness or masses . No hernias . No HSM Musculoskeletal:  . Digits/nails BUE: no clubbing, cyanosis, petechiae, infection . exam of joints, bones, muscles of at least one of following: head/neck, RUE, LUE, RLE, LLE   o strength and tone normal, no atrophy, no abnormal movements o No tenderness, masses o Normal ROM, no contractures  . gait and station not assessed Skin:  . No rashes, lesions, ulcers . palpation of skin: no induration or nodules Neurologic:  . CN 2-12 intact . Sensation all 4 extremities intact Psychiatric:  . Mental status o Mood, affect appropriate o Orientation to person, place, time  . judgment and insight appear intact intact   I have  personally reviewed following labs and imaging studies  Labs:   Results for Sladek, GEORGibraltarMRN [id number] of 01/20/2018 09:36  Ref. Range 01/20/2018 06:00 01/20/2018 06:04 01/20/2018 06:09  COMPREHENSIVE METABOLIC PANEL Unknown   Rpt (A)  Sodium Latest Ref Range: 135 - 145 mmol/L   140  Potassium Latest Ref Range: 3.5 - 5.1 mmol/L   4.2  Chloride Latest Ref Range: 98 - 111 mmol/L   100  CO2 Latest Ref Range: 22 - 32 mmol/L   31  Glucose Latest Ref Range: 70 - 99 mg/dL   193 (H)  BUN Latest Ref Range: 8 - 23 mg/dL   15  Creatinine Latest Ref Range: 0.44 - 1.00 mg/dL  0.71  Calcium Latest Ref Range: 8.9 - 10.3 mg/dL   9.3  Anion gap Latest Ref Range: 5 - 15    9  Alkaline Phosphatase Latest Ref Range: 38 - 126 U/L   52  Albumin Latest Ref Range: 3.5 - 5.0 g/dL   3.9  AST Latest Ref Range: 15 - 41 U/L   21  ALT Latest Ref Range: 0 - 44 U/L   14  Total Protein Latest Ref Range: 6.5 - 8.1 g/dL   6.4 (L)  Total Bilirubin Latest Ref Range: 0.3 - 1.2 mg/dL   0.5  GFR, Est Non African American Latest Ref Range: >60 mL/min   >60  GFR, Est African American Latest Ref Range: >60 mL/min   >60  B Natriuretic Peptide Latest Ref Range: 0.0 - 100.0 pg/mL   107.0 (H)  WBC Latest Ref Range: 4.0 - 10.5 K/uL   15.4 (H)  RBC Latest Ref Range: 3.87 - 5.11 MIL/uL   3.73 (L)  Hemoglobin Latest Ref Range: 12.0 - 15.0 g/dL   11.1 (L)  HCT Latest Ref Range: 36.0 - 46.0 %   36.6  MCV Latest Ref Range: 80.0 - 100.0 fL   98.1  MCH Latest Ref Range: 26.0 - 34.0 pg   29.8  MCHC Latest Ref Range: 30.0 - 36.0 g/dL   30.3  RDW Latest Ref Range: 11.5 - 15.5 %   13.4  Platelets Latest Ref Range: 150 - 400 K/uL   244  nRBC Latest Ref Range: 0.0 - 0.2 %   0.0  Neutrophils Latest Units: %   71  Lymphocytes Latest Units: %   18  Monocytes Relative Latest Units: %   9  Eosinophil Latest Units: %   1  Basophil Latest Units: %   0  Immature Granulocytes Latest Units: %   1  NEUT# Latest Ref Range: 1.7 - 7.7 K/uL    10.9 (H)  Lymphocyte # Latest Ref Range: 0.7 - 4.0 K/uL   2.8  Monocyte # Latest Ref Range: 0.1 - 1.0 K/uL   1.4 (H)  Eosinophils Absolute Latest Ref Range: 0.0 - 0.5 K/uL   0.1  Basophils Absolute Latest Ref Range: 0.0 - 0.1 K/uL   0.1  Abs Immature Granulocytes Latest Ref Range: 0.00 - 0.07 K/uL   0.07  Prothrombin Time Latest Ref Range: 11.4 - 15.2 seconds   17.4 (H)  INR Unknown   1.45  Sample Expiration Unknown  01/23/2018...   Antibody Screen Unknown  NEG   ABO/RH(D) Unknown  B POS      Imaging studies:  CLINICAL DATA:  Acute onset of bright red bleeding from the rectum. Lightheadedness and dizziness. Lower abdominal pain.  EXAM: PORTABLE CHEST 1 VIEW  COMPARISON:  Chest radiograph performed 09/17/2017  FINDINGS: A small left pleural effusion is suspected. Mild left basilar airspace opacity likely reflects atelectasis. No pneumothorax is seen.  The cardiomediastinal silhouette is borderline normal in size. No acute osseous abnormalities are identified.  IMPRESSION: Suspect small left pleural effusion. Mild left basilar airspace opacity likely reflects atelectasis.   Electronically Signed   By: Garald Balding M.D.   On: 01/20/2018 06:07 Medical tests:   EKG independently reviewed:   Test discussed with performing physician:  Decision to obtain old records:     Review and summation of old records:   None  Principal Problem:   Acute upper GI bleeding Active Problems:   COPD exacerbation ([place])   Essential hypertension   Diabetes  mellitus ([place])   Obstructive chronic bronchitis with exacerbation ([facility])   GI bleeding   Assessment/Plan #1 acute GI bleed with no prior history of GI bleed.  Hemoglobin is stable so far blood has been typed and crossmatched.  Will monitor H&H 2.  COPD exacerbation on chronic O2 therapy we will start IV Solu-Medrol as well as nebulizer treatment 3.  Paroxysmal atrial fibrillation on Xarelto due to her GI bleed we will  have to hold Xarelto metoprolol is on hold I will continue the diltiazem cautiously 4.  Hypertension with a brief history of hypotension needing IV fluid resuscitation we will hold metoprolol 5.  Type 2 diabetes mellitus non-insulin-dependent she is on metformin we will hold that and cover her with insulin since patient is going to be n.p.o. 6.  Leukocytosis of 15.4 this may be due to her COPD exacerbation.  I will start her on Zithromax IV and monitor H&H as well as CBC We will obtain consult with gastroenterology Patient will be admitted to inpatient    Severity of Illness: The appropriate patient status for this patient is INPATIENT. Inpatient status is judged to be reasonable and necessary in order to provide the required intensity of service to ensure the patient's safety. The patient's presenting symptoms, physical exam findings, and initial radiographic and laboratory data in the context of their chronic comorbidities is felt to place them at high risk for further clinical deterioration. Furthermore, it is not anticipated that the patient will be medically stable for discharge from the hospital within 2 midnights of admission. The following factors support the patient status of inpatient.   " The patient's presenting symptoms include gi bleed. " The worrisome physical exam findings include hypotension needing IV fluid resuscitation. " The initial radiographic and laboratory data are worrisome because of  " The chronic co-morbidities include atrial fibrillation on Xarelto   * I certify that at the point of admission it is my clinical judgment that the patient will require inpatient hospital care spanning beyond 2 midnights from the point of admission due to high intensity of service, high risk for further deterioration and high frequency of surveillance required.     DVT prophylaxis: SCD Code Status: Full Family Communication: 2 daughters at bedside Consults called: GI  Time spent:  35 minutes  Dr. Kyung Bacca [facility] Pager [phone]  01/20/2018, 9:20 AM

## 2018-01-20 NOTE — ED Triage Notes (Signed)
Pt states she has had bright red bleeding from her rectum since Monday. Pt states she has been feeling lightheaded and dizzy since that time. Pt C/O lower abdominal pain. Denies N/V.

## 2018-01-20 NOTE — ED Provider Notes (Addendum)
Eileen [name] EMERGENCY DEPARTMENT Provider Note   CSN: [id number] Arrival date & time: 01/20/18  0500     History   Chief Complaint Chief Complaint  Patient presents with  . Rectal Bleeding    HPI [name] is a 72 y.o. female.  Patient presents to the emergency department for evaluation of rectal bleeding.  Patient reports that she has been noticing intermittent red blood per rectum as well as passage of clots for one week.  She has contacted her doctor and is being set up for GI consultation, but has not happened.  Patient reports that tonight she started having heavy bleeding.  She has noticed that she has increased shortness of breath and severe dyspnea on exertion.  Does report that she has been experiencing cough and chest congestion for the last couple of days.  She does have a history of COPD.  She is chronically on oxygen.     Past Medical History:  Diagnosis Date  . Asthma   . Atrial flutter ([place])   . COPD (chronic obstructive pulmonary disease) ([place])   . History of breast cancer    right breast  . Hypertension   . Type 2 diabetes mellitus [facility])     Patient Active Problem List   Diagnosis Date Noted  . Acute respiratory failure with hypoxia ([place]) 09/17/2017  . Diabetes mellitus ([place]) 08/31/2017  . Acute hypoxemic respiratory failure ([place]) 08/31/2017  . Atrial fibrillation with RVR ([place]) 09/24/2016  . Atrial flutter ([place]) 09/23/2016  . Acute on chronic respiratory failure ([place]) 03/24/2015  . Metabolic encephalopathy 24/40/1027  . HCAP (healthcare-associated pneumonia) 03/24/2015  . Essential hypertension   . Anticoagulation management encounter   . New onset atrial flutter ([place]) 03/21/2015  . Obesity 03/21/2015  . Atrial flutter with rapid ventricular response ([place]) 03/21/2015  . COPD (chronic obstructive pulmonary disease) ([place]) 12/20/2014  . COPD exacerbation ([place]) 12/20/2014  . DCIS (ductal carcinoma in situ) of breast, right, S/P total  mastectomy 10/2009, Arimadex 03/10/2011    Past Surgical History:  Procedure Laterality Date  . CESAREAN SECTION    . HERNIA REPAIR     RIH  . MASTECTOMY  2011   right breast     OB History    Gravida      Para      Term      Preterm      AB      Living  5     SAB      TAB      Ectopic      Multiple      Live Births               Home Medications    Prior to Admission medications   Medication Sig Start Date End Date Taking? Authorizing Provider  acetaminophen (TYLENOL) 500 MG tablet Take 500 mg by mouth every 8 (eight) hours as needed for mild pain or headache.     Lavella Lemons, PA  albuterol (PROVENTIL) (2.5 MG/3ML) 0.083% nebulizer solution Take 2.5 mg by nebulization every 6 (six) hours as needed for wheezing or shortness of breath.    [provider]  Ascorbic Acid (VITAMIN C PO) Take 1 tablet by mouth daily.    [provider]  blood glucose meter kit and supplies KIT Dispense based on patient and insurance preference. Use up to four times daily as directed. (FOR ICD-9 250.00, 250.01).  Check sugar before meals and at night, keep a  log. 09/29/16   Arrien, Jimmy Picket, MD  diltiazem (CARDIZEM CD) 240 MG 24 hr capsule TAKE ONE CAPSULE BY MOUTH DAILY. 01/11/18   Herminio Commons, MD  guaiFENesin (MUCINEX) 600 MG 12 hr tablet Take 1 tablet (600 mg total) by mouth 2 (two) times daily. 09/21/17   Kathie Dike, MD  metFORMIN (GLUCOPHAGE-XR) 500 MG 24 hr tablet Take 500 mg by mouth daily. 08/10/17   [provider]  metoprolol tartrate (LOPRESSOR) 50 MG tablet Take 1 tablet (50 mg total) by mouth 2 (two) times daily. 09/29/16   Arrien, Jimmy Picket, MD  Multiple Vitamins-Minerals (EMERGEN-C VITAMIN C PO) Take 1 Package by mouth daily.    [provider]  OXYGEN Inhale 2.5-3 L into the lungs daily. At night and daily as needed    [provider]  predniSONE (DELTASONE) 10 MG tablet Take 68m po daily for 2  days then 360mdaily for 2 days then 2065maily for 2 days then 34m59mily 09/21/17   MemoKathie Dike  PROAIR HFA 108 (90 BASE) MCG/ACT inhaler Inhale 2 puffs into the lungs every 6 (six) hours as needed for wheezing or shortness of breath.  03/06/11   [provider]  rivaroxaban (XARELTO) 20 MG TABS tablet Take 1 tablet (20 mg total) by mouth daily with supper. 11/20/17   KoneHerminio Commons  TRELEGY ELLIPTA 100-62.5-25 MCG/INH AEPB Take 1 puff by mouth daily. 08/27/17   [provider]  Vitamin D, Ergocalciferol, (DRISDOL) 50000 units CAPS capsule Take 50,000 Units every 30 (thirty) days by mouth.    [provider]    Family History Family History  Problem Relation Age of Onset  . Cancer Mother        lung  . Diabetes Brother     Social History Social History   Tobacco Use  . Smoking status: Former Smoker    Packs/day: 0.50    Years: 32.00    Pack years: 16.00    Types: Cigarettes    Start date: 12/12/1962    Last attempt to quit: 03/13/1994    Years since quitting: 23.8  . Smokeless tobacco: Never Used  Substance Use Topics  . Alcohol use: No    Alcohol/week: 0.0 standard drinks  . Drug use: No     Allergies   Codeine   Review of Systems Review of Systems  Respiratory: Positive for shortness of breath.   Gastrointestinal: Positive for blood in stool.  All other systems reviewed and are negative.    Physical Exam Updated Vital Signs BP 104/64 (BP Location: Left Arm)   Pulse (!) 117   Temp 97.8 F (36.6 C) (Oral)   Resp 18   Ht 5' 3"  (1.6 m)   Wt 80.3 kg   SpO2 99%   BMI 31.35 kg/m   Physical Exam  Constitutional: She is oriented to person, place, and time. She appears well-developed and well-nourished. No distress.  HENT:  Head: Normocephalic and atraumatic.  Right Ear: Hearing normal.  Left Ear: Hearing normal.  Nose: Nose normal.  Mouth/Throat: Oropharynx is clear and moist and mucous membranes are normal.  Eyes:  Pupils are equal, round, and reactive to light. Conjunctivae and EOM are normal.  Neck: Normal range of motion. Neck supple.  Cardiovascular: Regular rhythm, S1 normal and S2 normal. Tachycardia present. Exam reveals no gallop and no friction rub.  No murmur heard. Pulmonary/Chest: Effort normal. No respiratory distress. She has decreased breath sounds. She exhibits no tenderness.  Abdominal: Soft. Normal appearance and bowel sounds are normal. There is no hepatosplenomegaly. There is no tenderness. There is no rebound, no guarding, no tenderness at McBurney's point and negative Murphy's sign. No hernia.  Musculoskeletal: Normal range of motion.  Neurological: She is alert and oriented to person, place, and time. She has normal strength. No cranial nerve deficit or sensory deficit. Coordination normal. GCS eye subscore is 4. GCS verbal subscore is 5. GCS motor subscore is 6.  Skin: Skin is warm, dry and intact. No rash noted. No cyanosis.  Psychiatric: She has a normal mood and affect. Her speech is normal and behavior is normal. Thought content normal.  Nursing note and vitals reviewed.    ED Treatments / Results  Labs (all labs ordered are listed, but only abnormal results are displayed) Labs Reviewed  CBC WITH DIFFERENTIAL/PLATELET - Abnormal; Notable for the following components:      Result Value   WBC 15.4 (*)    RBC 3.73 (*)    Hemoglobin 11.1 (*)    Neutro Abs 10.9 (*)    Monocytes Absolute 1.4 (*)    All other components within normal limits  COMPREHENSIVE METABOLIC PANEL - Abnormal; Notable for the following components:   Glucose, Bld 193 (*)    Total Protein 6.4 (*)    All other components within normal limits  PROTIME-INR - Abnormal; Notable for the following components:   Prothrombin Time 17.4 (*)    All other components within normal limits  BRAIN NATRIURETIC PEPTIDE - Abnormal; Notable for the following components:   B Natriuretic Peptide 107.0 (*)    All other  components within normal limits  I-STAT TROPONIN, ED  TYPE AND SCREEN    EKG EKG Interpretation  Date/Time:  Sunday January 20 2018 05:40:42 EST Ventricular Rate:  104 PR Interval:    QRS Duration: 78 QT Interval:  403 QTC Calculation: 533 R Axis:   52 Text Interpretation:  Sinus tachycardia Low voltage, precordial leads Borderline repolarization abnormality Prolonged QT interval Baseline wander in lead(s) II III aVL aVF Confirmed by Orpah Greek ([id number]) on 01/20/2018 5:54:17 AM   Radiology Dg Chest Port 1 View  Result Date: 01/20/2018 CLINICAL DATA:  Acute onset of bright red bleeding from the rectum. Lightheadedness and dizziness. Lower abdominal pain. EXAM: PORTABLE CHEST 1 VIEW COMPARISON:  Chest radiograph performed 09/17/2017 FINDINGS: A small left pleural effusion is suspected. Mild left basilar airspace opacity likely reflects atelectasis. No pneumothorax is seen. The cardiomediastinal silhouette is borderline normal in size. No acute osseous abnormalities are identified. IMPRESSION: Suspect small left pleural effusion. Mild left basilar airspace opacity likely reflects atelectasis. Electronically Signed   By: Garald Balding M.D.   On: 01/20/2018 06:07    Procedures Procedures (including critical care time)  Medications Ordered in ED Medications  sodium chloride 0.9 % bolus 500 mL (500 mLs Intravenous New Bag/Given 01/20/18 0704)  ipratropium-albuterol (DUONEB) 0.5-2.5 (3) MG/3ML nebulizer solution 3 mL (3 mLs Nebulization Given 01/20/18 0655)  albuterol (PROVENTIL) (2.5 MG/3ML) 0.083% nebulizer solution 2.5 mg (2.5 mg Nebulization Given 01/20/18 0655)     Initial Impression / Assessment and Plan / ED Course  I have reviewed the triage vital signs and the nursing notes.  Pertinent labs & imaging results that were available during my care of the patient were reviewed by me and considered in my medical decision making (see chart for details).     Patient  presents to the emergency department for evaluation of rectal bleeding.  Patient experiencing intermittent bleeding for a week.  She reports that she started having more frequent and heavier bleeding overnight.  Patient passing gross blood here in the ER.  She is on Xarelto due to history of atrial fibrillation.  Patient noted to be hypoxic at arrival.  She is moving from the wheelchair into the bed she became very dyspneic and her oxygen saturation dropped to 81%.  This was on her normal supplemental oxygen.  Oxygen was increased to 4 L with improvement of her oxygenation.  Lung examination revealed significantly diminished breath sounds bilaterally.  Patient's hemoglobin is actually at her baseline.  The dyspnea on exertion and hypoxia is therefore unlikely to be from symptomatic anemia.  Patient has severe COPD and has had increased cough for the last couple of days.  This is likely COPD exacerbation.  Chest x-ray does not show evidence of pulmonary edema, no pneumonia.  Patient administered Solu-Medrol and nebulizer treatment.  Will require hospitalization.  Upon returning from the bathroom during her work-up here in the ER, she became diaphoretic, and dyspneic and blood pressure dropped.  She was given a fluid bolus with improvement.  As she is on Xarelto (last dose yesterday evening), will need to monitor her hemoglobin closely, however, does not require transfusion at this time.  CRITICAL CARE Performed by: Orpah Greek   Total critical care time: 30 minutes  Critical care time was exclusive of separately billable procedures and treating other patients.  Critical care was necessary to treat or prevent imminent or life-threatening deterioration.  Critical care was time spent personally by me on the following activities: development of treatment plan with patient and/or surrogate as well as nursing, discussions with consultants, evaluation of patient's response to treatment,  examination of patient, obtaining history from patient or surrogate, ordering and performing treatments and interventions, ordering and review of laboratory studies, ordering and review of radiographic studies, pulse oximetry and re-evaluation of patient's condition.   Final Clinical Impressions(s) / ED Diagnoses   Final diagnoses:  Lower GI bleed  Chronic obstructive pulmonary disease with acute exacerbation [facility])    ED Discharge Orders    None       Orpah Greek, MD 01/20/18 [id number]    Orpah Greek, MD 01/20/18 [id number]

## 2018-01-21 DIAGNOSIS — K922 Gastrointestinal hemorrhage, unspecified: Secondary | ICD-10-CM

## 2018-01-21 LAB — COMPREHENSIVE METABOLIC PANEL
ALK PHOS: 28 U/L — AB (ref 38–126)
ALT: 14 U/L (ref 0–44)
AST: 18 U/L (ref 15–41)
Albumin: 3.4 g/dL — ABNORMAL LOW (ref 3.5–5.0)
Anion gap: 6 (ref 5–15)
BILIRUBIN TOTAL: 0.7 mg/dL (ref 0.3–1.2)
BUN: 12 mg/dL (ref 8–23)
CALCIUM: 8.6 mg/dL — AB (ref 8.9–10.3)
CO2: 31 mmol/L (ref 22–32)
CREATININE: 0.62 mg/dL (ref 0.44–1.00)
Chloride: 102 mmol/L (ref 98–111)
GFR calc Af Amer: >60 mL/min (ref >60)
GFR calc non Af Amer: >60 mL/min (ref >60)
GLUCOSE: 185 mg/dL — AB (ref 70–99)
Potassium: 4.5 mmol/L (ref 3.5–5.1)
Sodium: 139 mmol/L (ref 135–145)
TOTAL PROTEIN: 5.5 g/dL — AB (ref 6.5–8.1)

## 2018-01-21 LAB — GLUCOSE, CAPILLARY
GLUCOSE-CAPILLARY: 153 mg/dL — AB (ref 70–99)
Glucose-Capillary: 159 mg/dL — ABNORMAL HIGH (ref 70–99)
Glucose-Capillary: 173 mg/dL — ABNORMAL HIGH (ref 70–99)
Glucose-Capillary: 155 mg/dL — ABNORMAL HIGH (ref 70–99)

## 2018-01-21 MED ORDER — PEG 3350-KCL-NA BICARB-NACL 420 G PO SOLR
4000.0000 mL | Freq: Once | ORAL | Status: AC
  Administered 2018-01-21: 4000 mL via ORAL
  Filled 2018-01-21: qty 4000

## 2018-01-21 MED ORDER — PEG 3350-KCL-NA BICARB-NACL 420 G PO SOLR
4000.0000 mL | Freq: Once | ORAL | Status: DC
  Administered 2018-01-21: 4000 mL via ORAL
  Filled 2018-01-21 (×2): qty 4000

## 2018-01-21 NOTE — Progress Notes (Signed)
Central Monitoring called and states patient heart rate had increased. Upon assessment of patient, patient stated that she had gotten up to go to the bathroom and came back to bed. Patient denies chest pain,  Only states that she had a little shortness of breath but that is typical of her baseline. Patient denies any need at this time.

## 2018-01-21 NOTE — Progress Notes (Signed)
  Subjective:  Patient has no complaints.  She states she noted very small amount of blood with her bowel movement earlier today.  She denies nausea or vomiting.  She complains of intermittent fullness under the left rib cage.  She says she is not receiving 1 of her bronchodilators.  Nursing staff made aware.  Objective: Blood pressure 98/72, pulse (!) 106, temperature 98.3 F (36.8 C), temperature source Oral, resp. rate 20, height 5\' 3"  (1.6 m), weight 79.6 kg, SpO2 96 %. Patient is alert and in no acute distress. Auscultation of lungs reveal vesicular breath sounds bilaterally.  Intensity is diminished. Cardiac exam with good rhythm normal S1-S2.  No murmur gallop noted. Abdomen is full.  Small umbilical hernia noted.  It is completely reducible.  Abdomen is soft and nontender with organomegaly or masses.  Labs/studies Results:  Recent Labs    01/23/18 0609 01/23/18 1500  WBC 15.4* 12.0*  HGB 11.1* 9.2*  HCT 36.6 31.3*  PLT 244 250    BMET  Recent Labs    01/23/2018 0609 01/21/18 0601  NA 140 139  K 4.2 4.5  CL 100 102  CO2 31 31  GLUCOSE 193* 185*  BUN 15 12  CREATININE 0.71 0.62  CALCIUM 9.3 8.6*    LFT  Recent Labs    January 23, 2018 0609 01/21/18 0601  PROT 6.4* 5.5*  ALBUMIN 3.9 3.4*  AST 21 18  ALT 14 14  ALKPHOS 52 28*  BILITOT 0.5 0.7    PT/INR  Recent Labs    01/23/2018 0609  LABPROT 17.4*  INR 1.45     Assessment:  #1.  Rectal bleeding in the setting of chronic anticoagulation.  Patient has been off Xarelto for 2 days and bleeding has stopped.  #2.  Anemia secondary to GI bleed.  #3.  COPD.  Patient is on nasal O2.  Appears to be hemodynamically stable.  #4.  History of SVT.  EKG from this morning reveals sinus tachycardia.  Recommendations:  GoLYTELY prep today. Colonoscopy with moderate sedation on 01/22/2018.

## 2018-01-21 NOTE — Care Management Note (Signed)
Case Management Note  Patient Details  Name: Eileen [name] MRN: [id number] Date of Birth: 1945/10/15  Subjective/Objective:               Admitted with GIB. Pt from home, has strong family support. Pt has home O2 and neb machine. Pt has no issues with medications or transportation. Pt has regularly PCP follow up. Pt communicates no needs or concerns. Pt active with [facility].     Action/Plan: DC home with self care. No CM needs noted at this time.   Expected Discharge Date:     01/22/18             Expected Discharge Plan:  Home/Self Care  In-House Referral:  NA  Discharge planning Services  CM Consult  Post Acute Care Choice:  NA Choice offered to:  NA  Status of Service:  Completed, signed off  Sherald Barge, RN 01/21/2018, 12:41 PM

## 2018-01-21 NOTE — Progress Notes (Signed)
PROGRESS NOTE  Eileen [name] SFK:[id number] DOB: [birth year] DOA: 01/20/2018 PCP: Lavella Lemons, PA  Brief Narrative: HPI:  Eileen [name] is a 72 year old female who presented to the emergency department this morning with with complaints  of GI bleed for 1 week that got worse this morning with blood clots and feeling generally weak associated with abdominal discomfort.  She had informed her primary care provider about this who had referred her to GI for this coming Friday or Thursday.  Patient denies any prior history of rectal or GI bleed.  She also noticed that she is been having increased shortness of breath with severe dyspnea on exertion and has been having some cough with congestion over the past couple of days she is COPD with dependent oxygen at 2 to 3 L/min at home.  She also has atrial fibrillation and is on Xarelto    Interval history/Subjective: Doing well denies any complaints today patient is scheduled to have colonoscopy tomorrow  Assessment/Plan 1 acute GI bleed with no prior history of GI bleed.  Hemoglobin is stable so far blood has been typed and crossmatched.  Will monitor H&H.  Patient is scheduled for colonoscopy in the morning 2.  COPD exacerbation on chronic O2 therapy we will start IV Solu-Medrol as well as nebulizer treatment 3.  Paroxysmal atrial fibrillation on Xarelto due to her GI bleed we will have to hold Xarelto metoprolol is on hold I will continue the diltiazem cautiously 4.  Hypertension with a brief history of hypotension needing IV fluid resuscitation we will hold metoprolol 5.  Type 2 diabetes mellitus non-insulin-dependent she is on metformin we will hold that and cover her with insulin .Marland Kitchen 6.  Leukocytosis of 15.4 this may be due to her COPD exacerbation.  I will start her on Zithromax IV and monitor H&H as well as CBC    DVT prophylaxis: SCD Code Status: Full Family Communication: 2 daughters at bedside Consults called: GI   Dr.  Kyung Bacca [facility] Pager [phone]  01/21/2018, 5:37 PM  LOS: 1 day   Procedures:  None  Antimicrobials:  Zithromax   Objective: Vitals:  Vitals:   01/21/18 0744 01/21/18 1345  BP:    Pulse:    Resp:    Temp:    SpO2: 96% 96%    Exam:  Constitutional:  . Appears calm and comfortable Eyes:  . pupils and irises appear normal . Normal lids and conjunctivae ENMT:  . grossly normal hearing  . Lips appear normal . external ears, nose appear normal . Oropharynx: mucosa, tongue,posterior pharynx appear normal Neck:  . neck appears normal, no masses, normal ROM, supple . no thyromegaly Respiratory:  . CTA bilaterally, no w/r/r.  . Respiratory effort normal. No retractions or accessory muscle use Cardiovascular:  . RRR, no m/r/g . No LE extremity edema   . Normal pedal pulses Abdomen:  . Abdomen appears normal; no tenderness or masses . No hernias . No HSM Musculoskeletal:  . Digits/nails BUE: no clubbing, cyanosis, petechiae, infection . exam of joints, bones, muscles of at least one of following: head/neck, RUE, LUE, RLE, LLE   o strength and tone normal, no atrophy, no abnormal movements o No tenderness, masses o Normal ROM, no contractures  . gait and station Skin:  . No rashes, lesions, ulcers . palpation of skin: no induration or nodules Neurologic:  . CN 2-12 intact . Sensation all 4 extremities intact Psychiatric:  . Mental status o Mood, affect appropriate o Orientation to  person, place, time  . judgment and insight appear intact     I have personally reviewed the following:   Data: Results for Serfass, Eileen A (MRN [id number]) as of 01/21/2018 19:53  Ref. Range 01/21/2018 06:01  COMPREHENSIVE METABOLIC PANEL Unknown Rpt (A)  Sodium Latest Ref Range: 135 - 145 mmol/L 139  Potassium Latest Ref Range: 3.5 - 5.1 mmol/L 4.5  Chloride Latest Ref Range: 98 - 111 mmol/L 102  CO2 Latest Ref Range: 22 - 32 mmol/L 31  Glucose Latest Ref Range:  70 - 99 mg/dL 185 (H)  BUN Latest Ref Range: 8 - 23 mg/dL 12  Creatinine Latest Ref Range: 0.44 - 1.00 mg/dL 0.62  Calcium Latest Ref Range: 8.9 - 10.3 mg/dL 8.6 (L)  Anion gap Latest Ref Range: 5 - 15  6  Alkaline Phosphatase Latest Ref Range: 38 - 126 U/L 28 (L)  Albumin Latest Ref Range: 3.5 - 5.0 g/dL 3.4 (L)  AST Latest Ref Range: 15 - 41 U/L 18  ALT Latest Ref Range: 0 - 44 U/L 14  Total Protein Latest Ref Range: 6.5 - 8.1 g/dL 5.5 (L)  Total Bilirubin Latest Ref Range: 0.3 - 1.2 mg/dL 0.7  GFR, Est Non African American Latest Ref Range: >60 mL/min >60  GFR, Est African American Latest Ref Range: >60 mL/min >60    Scheduled Meds: . budesonide (PULMICORT) nebulizer solution  0.25 mg Nebulization BID  . diltiazem  240 mg Oral Daily  . guaiFENesin  600 mg Oral BID  . hydrocortisone   Rectal QID  . insulin aspart  0-9 Units Subcutaneous TID WC  . levalbuterol  0.63 mg Nebulization TID  . mouth rinse  15 mL Mouth Rinse BID  . methylPREDNISolone sodium succinate  60 mg Intravenous Q12H  . umeclidinium-vilanterol  1 puff Inhalation Daily  . vitamin C  500 mg Oral Daily  . Vitamin D (Ergocalciferol)  50,000 Units Oral Q30 days   Continuous Infusions: . sodium chloride 75 mL/hr at 01/21/18 0330  . azithromycin 500 mg (01/21/18 1210)    Principal Problem:   Acute upper GI bleeding Active Problems:   COPD exacerbation (HCC)   Essential hypertension   Diabetes mellitus (HCC)   Obstructive chronic bronchitis with exacerbation (HCC)   Lower GI bleed   LOS: 1 day

## 2018-01-22 ENCOUNTER — Encounter (HOSPITAL_COMMUNITY)
Admission: EM | Disposition: A | Discharge status: Home / Self Care | Payer: Self-pay | Source: Home / Self Care | Attending: Family Medicine

## 2018-01-22 ENCOUNTER — Encounter (HOSPITAL_COMMUNITY): Payer: Self-pay | Admitting: *Deleted

## 2018-01-22 DIAGNOSIS — K5730 Diverticulosis of large intestine without perforation or abscess without bleeding: Secondary | ICD-10-CM

## 2018-01-22 DIAGNOSIS — I10 Essential (primary) hypertension: Secondary | ICD-10-CM

## 2018-01-22 DIAGNOSIS — K625 Hemorrhage of anus and rectum: Secondary | ICD-10-CM

## 2018-01-22 DIAGNOSIS — K648 Other hemorrhoids: Secondary | ICD-10-CM

## 2018-01-22 DIAGNOSIS — J441 Chronic obstructive pulmonary disease with (acute) exacerbation: Secondary | ICD-10-CM

## 2018-01-22 HISTORY — PX: COLONOSCOPY: SHX5424

## 2018-01-22 LAB — HEMOGLOBIN AND HEMATOCRIT, BLOOD
HCT: 36.3 % (ref 36.0–46.0)
Hemoglobin: 11 g/dL — ABNORMAL LOW (ref 12.0–15.0)

## 2018-01-22 LAB — CBC
HCT: 24.4 % — ABNORMAL LOW (ref 36.0–46.0)
HEMOGLOBIN: 7.2 g/dL — AB (ref 12.0–15.0)
MCH: 28.6 pg (ref 26.0–34.0)
MCHC: 29.5 g/dL — AB (ref 30.0–36.0)
MCV: 96.8 fL (ref 80.0–100.0)
PLATELETS: 234 10*3/uL (ref 150–400)
RBC: 2.52 MIL/uL — ABNORMAL LOW (ref 3.87–5.11)
RDW: 13.7 % (ref 11.5–15.5)
WBC: 16.4 10*3/uL — AB (ref 4.0–10.5)
nRBC: 0 % (ref 0.0–0.2)

## 2018-01-22 LAB — ABO/RH: ABO/RH(D): B POS

## 2018-01-22 LAB — GLUCOSE, CAPILLARY
GLUCOSE-CAPILLARY: 280 mg/dL — AB (ref 70–99)
GLUCOSE-CAPILLARY: 126 mg/dL — AB (ref 70–99)
Glucose-Capillary: 169 mg/dL — ABNORMAL HIGH (ref 70–99)
Glucose-Capillary: 137 mg/dL — ABNORMAL HIGH (ref 70–99)

## 2018-01-22 LAB — PREPARE RBC (CROSSMATCH)

## 2018-01-22 SURGERY — COLONOSCOPY
Anesthesia: Moderate Sedation

## 2018-01-22 MED ORDER — MIDAZOLAM HCL 5 MG/5ML IJ SOLN
INTRAMUSCULAR | Status: DC | PRN
  Administered 2018-01-22: 2 mg via INTRAVENOUS
  Administered 2018-01-22 (×2): 1 mg via INTRAVENOUS

## 2018-01-22 MED ORDER — MEPERIDINE HCL 50 MG/ML IJ SOLN
INTRAMUSCULAR | Status: DC | PRN
  Administered 2018-01-22: 25 mg via INTRAVENOUS

## 2018-01-22 MED ORDER — STERILE WATER FOR IRRIGATION IR SOLN
Status: DC | PRN
  Administered 2018-01-22: 100 mL

## 2018-01-22 MED ORDER — PSYLLIUM 95 % PO PACK
1.0000 | PACK | Freq: Every day | ORAL | Status: DC
  Administered 2018-01-22 – 2018-01-23 (×2): 1 via ORAL
  Filled 2018-01-22 (×3): qty 1

## 2018-01-22 MED ORDER — SODIUM CHLORIDE 0.9% IV SOLUTION
Freq: Once | INTRAVENOUS | Status: AC
  Administered 2018-01-22: 13:00:00 via INTRAVENOUS

## 2018-01-22 MED ORDER — SODIUM CHLORIDE 0.9 % IV SOLN: INTRAVENOUS | Status: DC

## 2018-01-22 MED ORDER — METHYLPREDNISOLONE SODIUM SUCC 125 MG IJ SOLR
40.0000 mg | Freq: Two times a day (BID) | INTRAMUSCULAR | Status: DC
  Administered 2018-01-22 – 2018-01-23 (×3): 40 mg via INTRAVENOUS
  Filled 2018-01-22 (×3): qty 2

## 2018-01-22 MED ORDER — MIDAZOLAM HCL 5 MG/5ML IJ SOLN
INTRAMUSCULAR | Status: AC
  Filled 2018-01-22: qty 10

## 2018-01-22 MED ORDER — MEPERIDINE HCL 50 MG/ML IJ SOLN
INTRAMUSCULAR | Status: AC
  Filled 2018-01-22: qty 1

## 2018-01-22 NOTE — Progress Notes (Signed)
Brief colonoscopy note:  Removed from the cecum. Excellent prep. Diverticula in sigmoid colon. Minimal external and internal hemorrhoids felt to be source of GI bleed.  No active bleeding noted.

## 2018-01-22 NOTE — Op Note (Signed)
[facility] Patient Name: Eileen [name] Procedure Date: 01/22/2018 12:56 PM MRN: [id number] Date of Birth: 26-Dec-1945 Attending MD: Hildred Laser , MD CSN: [id number] Age: 72 Admit Type: Inpatient Procedure:                Colonoscopy Indications:              Rectal bleeding Providers:                Hildred Laser, MD, Rosina Lowenstein, RN, Randa Spike, Technician Referring MD:             Thomes Dinning, MD Medicines:                Meperidine 25 mg IV, Midazolam 4 mg IV Complications:            No immediate complications. Estimated Blood Loss:     Estimated blood loss: none. Procedure:                Pre-Anesthesia Assessment:                           - Prior to the procedure, a History and Physical                            was performed, and patient medications and                            allergies were reviewed. The patient's tolerance of                            previous anesthesia was also reviewed. The risks                            and benefits of the procedure and the sedation                            options and risks were discussed with the patient.                            All questions were answered, and informed consent                            was obtained. Prior Anticoagulants: The patient                            last took Xarelto (rivaroxaban) 3 days prior to the                            procedure. ASA Grade Assessment: III - A patient                            with severe systemic disease. After reviewing the  risks and benefits, the patient was deemed in                            satisfactory condition to undergo the procedure.                           After obtaining informed consent, the colonoscope                            was passed under direct vision. Throughout the                            procedure, the patient's blood pressure, pulse, and                             oxygen saturations were monitored continuously. The                            [id number]-[id number] ([id number]) scope was introduced through                            the anus and advanced to the the cecum, identified                            by appendiceal orifice and ileocecal valve. The                            colonoscopy was performed without difficulty. The                            patient tolerated the procedure well. The quality                            of the bowel preparation was excellent. The                            ileocecal valve, appendiceal orifice, and rectum                            were photographed. Scope In: 1:12:07 PM Scope Out: 1:32:14 PM Scope Withdrawal Time: 0 hours 11 minutes 49 seconds  Total Procedure Duration: 0 hours 20 minutes 7 seconds  Findings:      The perianal and digital rectal examinations were normal.      Scattered diverticula were found in the sigmoid colon.      The exam was otherwise normal throughout the examined colon.      External and internal hemorrhoids were found during retroflexion. The       hemorrhoids were medium-sized. Impression:               - Diverticulosis in the sigmoid colon.                           - External and internal hemorrhoids.                           -  No specimens collected. Moderate Sedation:      Moderate (conscious) sedation was administered by the endoscopy nurse       and supervised by the endoscopist. The following parameters were       monitored: oxygen saturation, heart rate, blood pressure, CO2       capnography and response to care. Total physician intraservice time was       25 minutes. Recommendation:           - Return patient to hospital ward for ongoing care.                           - High fiber diet today.                           - Use hydrocortisone suppository 25 mg 1 per rectum                            once a day for 2 weeks.                           - Continue present  medications.                           - Metamucil 4 g po qhs.                           - Resume Xarelto (rivaroxaban) at prior dose in 4                            days.                           - No repeat colonoscopy due to age and the absence                            of advanced adenomas. Procedure Code(s):        --- Professional ---                           [phone], Colonoscopy, flexible; diagnostic, including                            collection of specimen(s) by brushing or washing,                            when performed (separate procedure)                           99153, Moderate sedation; each additional 15                            minutes intraservice time                           G0500, Moderate sedation services provided by the  same physician or other qualified health care                            professional performing a gastrointestinal                            endoscopic service that sedation supports,                            requiring the presence of an independent trained                            observer to assist in the monitoring of the                            patient's level of consciousness and physiological                            status; initial 15 minutes of intra-service time;                            patient age 71 years or older (additional time may                            be reported with [phone], as appropriate) Diagnosis Code(s):        --- Professional ---                           K64.8, Other hemorrhoids                           K62.5, Hemorrhage of anus and rectum                           K57.30, Diverticulosis of large intestine without                            perforation or abscess without bleeding CPT copyright 2018 [employer]. All rights reserved. The codes documented in this report are preliminary and upon coder review may  be revised to meet current compliance  requirements. Hildred Laser, MD Hildred Laser, MD 01/22/2018 1:44:20 PM This report has been signed electronically. Number of Addenda: 0

## 2018-01-22 NOTE — OR Nursing (Signed)
Dr. Laural Golden notified of patient's hemoglobin 7.2. He said for nurse to notifiy hospitalist to transfuse and to postpone procedure until 1300 today. Ordered for patient to have a clear liquid breakfast. Era Skeen, RN notified of the above.

## 2018-01-22 NOTE — Progress Notes (Signed)
PROGRESS NOTE  Eileen A Blick UXN:[id number] DOB: Jul 28, 1945 DOA: 01/20/2018 PCP: Lavella Lemons, PA  Brief Narrative: HPI:  Eileen [name] is a 72 year old female who presented to the emergency department this morning with with complaints  of GI bleed for 1 week that got worse this morning with blood clots and feeling generally weak associated with abdominal discomfort.  She had informed her primary care provider about this who had referred her to GI for this coming Friday or Thursday.  Patient denies any prior history of rectal or GI bleed.  She also noticed that she is been having increased shortness of breath with severe dyspnea on exertion and has been having some cough with congestion over the past couple of days she is COPD with dependent oxygen at 2 to 3 L/min at home.  She also has atrial fibrillation and is on Xarelto  Interval history/Subjective: Pt tolerated prep and for colonoscopy later this afternoon.  Blood transfusion ordered for this morning.   Assessment/Plan 1. acute GI bleed with no prior history of GI bleed.  Hemoglobin down to 7.2.  She has been typed and crossmatched.  PRBC transfusion ordered for this morning.  Patient is scheduled for colonoscopy with Dr. Laural Golden later today.  2.  COPD exacerbation on chronic O2 therapy.  Improving on  IV Solu-Medrol as well as nebulizer treatments.  3.  Paroxysmal atrial fibrillation on Xarelto due to her GI bleed we are temporarily holding Xarelto and metoprolol is on hold. Continue the diltiazem.  4.  Hypertension with a brief history of hypotension needing IV fluid resuscitation we will hold metoprolol.  No recurrence of hypotension.  PRBC transfusion this morning.  5.  Type 2 diabetes mellitus non-insulin-dependent she is on metformin which is being held.  SSI coverage ordered.  Follow CBG.  6.  Leukocytosis secondary to IV steroids.  Following.    DVT prophylaxis: SCD Code Status: Full Family Communication: 2 daughters at  bedside Consults called: GI  C. [facility] MD  [facility] Pager [phone]  01/22/2018, 9:33 AM  LOS: 2 days   Procedures:  None  Antimicrobials:  Zithromax   Objective: Vitals:  Vitals:   01/22/18 0754 01/22/18 0817  BP: 132/74 126/75  Pulse: 97 95  Resp: 18 18  Temp: 98.1 F (36.7 C) 98.2 F (36.8 C)  SpO2: 98% 100%    Exam:  Constitutional:  . Appears calm and comfortable Eyes:  . pupils and irises appear normal . Normal lids and conjunctivae ENMT:  . grossly normal hearing  . Lips appear normal . external ears, nose appear normal . Oropharynx: mucosa, tongue,posterior pharynx appear normal Neck:  . neck appears normal, no masses, normal ROM, supple . no thyromegaly Respiratory:  . CTA bilaterally, no wheezes or rales heard.   Marland Kitchen Respiratory effort normal. No retractions or accessory muscle use Cardiovascular:  . RRR, no m/r/g . No LE extremity edema   . Normal pedal pulses Abdomen:  . Abdomen appears normal; no tenderness or masses . No hernias . No HSM Musculoskeletal:  . Digits/nails BUE: no clubbing, cyanosis, petechiae, infection . exam of joints, bones, muscles of at least one of following: head/neck, RUE, LUE, RLE, LLE   o strength and tone normal, no atrophy, no abnormal movements o No tenderness, masses o Normal ROM, no contractures  . gait and station Skin:  . No rashes, lesions, ulcers . palpation of skin: no induration or nodules Neurologic:  . CN 2-12 intact . Sensation all 4 extremities  intact Psychiatric:  . Mental status o Mood, affect appropriate o Orientation to person, place, time  . judgment and insight appear intact     I have personally reviewed the following:   Data: Results for Cypert, Eileen A (MRN [id number]) as of 01/21/2018 19:53  Ref. Range 01/21/2018 06:01  COMPREHENSIVE METABOLIC PANEL Unknown Rpt (A)  Sodium Latest Ref Range: 135 - 145 mmol/L 139  Potassium Latest Ref Range: 3.5 - 5.1 mmol/L 4.5    Chloride Latest Ref Range: 98 - 111 mmol/L 102  CO2 Latest Ref Range: 22 - 32 mmol/L 31  Glucose Latest Ref Range: 70 - 99 mg/dL 185 (H)  BUN Latest Ref Range: 8 - 23 mg/dL 12  Creatinine Latest Ref Range: 0.44 - 1.00 mg/dL 0.62  Calcium Latest Ref Range: 8.9 - 10.3 mg/dL 8.6 (L)  Anion gap Latest Ref Range: 5 - 15  6  Alkaline Phosphatase Latest Ref Range: 38 - 126 U/L 28 (L)  Albumin Latest Ref Range: 3.5 - 5.0 g/dL 3.4 (L)  AST Latest Ref Range: 15 - 41 U/L 18  ALT Latest Ref Range: 0 - 44 U/L 14  Total Protein Latest Ref Range: 6.5 - 8.1 g/dL 5.5 (L)  Total Bilirubin Latest Ref Range: 0.3 - 1.2 mg/dL 0.7  GFR, Est Non African American Latest Ref Range: >60 mL/min >60  GFR, Est African American Latest Ref Range: >60 mL/min >60    Scheduled Meds: . sodium chloride   Intravenous Once  . budesonide (PULMICORT) nebulizer solution  0.25 mg Nebulization BID  . diltiazem  240 mg Oral Daily  . guaiFENesin  600 mg Oral BID  . hydrocortisone   Rectal QID  . insulin aspart  0-9 Units Subcutaneous TID WC  . levalbuterol  0.63 mg Nebulization TID  . mouth rinse  15 mL Mouth Rinse BID  . methylPREDNISolone sodium succinate  60 mg Intravenous Q12H  . umeclidinium-vilanterol  1 puff Inhalation Daily  . vitamin C  500 mg Oral Daily  . Vitamin D (Ergocalciferol)  50,000 Units Oral Q30 days   Continuous Infusions: . sodium chloride 75 mL/hr at 01/21/18 1747  . azithromycin 500 mg (01/21/18 1210)    Principal Problem:   Acute upper GI bleeding Active Problems:   COPD exacerbation (HCC)   Essential hypertension   Diabetes mellitus ([place])   Obstructive chronic bronchitis with exacerbation (HCC)   Lower GI bleed   LOS: 2 days

## 2018-01-22 NOTE — Progress Notes (Signed)
Per Threasa Beards, RN in Endo, GI wants patient transfused with 2 PRBC's prior to procedure today.  Paged MD to put in orders.

## 2018-01-22 NOTE — Progress Notes (Signed)
Patient has no complaints. He reports passing small amount of dark red blood when she took the prep yesterday.  She did not experience frank bleeding. Hemoglobin this morning is 7.2 and hematocrit 24.4. Therefore colonoscopy postponed until this afternoon until patient can get blood transfusion. Discussed with Dr. Wynetta Emery who has ordered PRBC transfusion.

## 2018-01-23 LAB — BPAM RBC
BLOOD PRODUCT EXPIRATION DATE: 201911152359
Blood Product Expiration Date: 201911302359
ISSUE DATE / TIME: 201911120748
ISSUE DATE / TIME: 201911121118
UNIT TYPE AND RH: 1700
Unit Type and Rh: 1700

## 2018-01-23 LAB — TYPE AND SCREEN
ABO/RH(D): B POS
Antibody Screen: NEGATIVE
Unit division: 0
Unit division: 0

## 2018-01-23 LAB — CBC
HCT: 30.6 % — ABNORMAL LOW (ref 36.0–46.0)
Hemoglobin: 9.6 g/dL — ABNORMAL LOW (ref 12.0–15.0)
MCH: 29.8 pg (ref 26.0–34.0)
MCHC: 31.4 g/dL (ref 30.0–36.0)
MCV: 95 fL (ref 80.0–100.0)
NRBC: 0.5 % — AB (ref 0.0–0.2)
PLATELETS: 212 10*3/uL (ref 150–400)
RBC: 3.22 MIL/uL — AB (ref 3.87–5.11)
RDW: 15.3 % (ref 11.5–15.5)
WBC: 15.5 10*3/uL — ABNORMAL HIGH (ref 4.0–10.5)

## 2018-01-23 LAB — BASIC METABOLIC PANEL
Anion gap: 7 (ref 5–15)
BUN: 18 mg/dL (ref 8–23)
CALCIUM: 8.6 mg/dL — AB (ref 8.9–10.3)
CO2: 32 mmol/L (ref 22–32)
Chloride: 101 mmol/L (ref 98–111)
Creatinine, Ser: 0.7 mg/dL (ref 0.44–1.00)
GFR calc Af Amer: >60 mL/min (ref >60)
GLUCOSE: 195 mg/dL — AB (ref 70–99)
POTASSIUM: 4.2 mmol/L (ref 3.5–5.1)
SODIUM: 140 mmol/L (ref 135–145)

## 2018-01-23 LAB — GLUCOSE, CAPILLARY
GLUCOSE-CAPILLARY: 160 mg/dL — AB (ref 70–99)
GLUCOSE-CAPILLARY: 224 mg/dL — AB (ref 70–99)

## 2018-01-23 MED ORDER — RIVAROXABAN 20 MG PO TABS: 20.0000 mg | ORAL_TABLET | Freq: Every day | ORAL | 0 refills | Status: DC

## 2018-01-23 MED ORDER — AZITHROMYCIN 500 MG PO TABS: 500.0000 mg | ORAL_TABLET | Freq: Every day | ORAL | 0 refills | Status: DC

## 2018-01-23 MED ORDER — PREDNISONE 20 MG PO TABS: ORAL_TABLET | ORAL | 0 refills | Status: DC

## 2018-01-23 MED ORDER — PSYLLIUM 95 % PO PACK: 1.0000 | PACK | Freq: Every day | ORAL | 0 refills | Status: DC

## 2018-01-23 MED ORDER — HYDROCORTISONE ACETATE 25 MG RE SUPP: 25.0000 mg | Freq: Every day | RECTAL | 0 refills | Status: AC

## 2018-01-23 NOTE — Discharge Summary (Signed)
Physician Discharge Summary  Eileen [name] MWU:[id number] DOB: 07-13-1945 DOA: 01/20/2018  PCP: Lavella Lemons, PA GI: Rehman Cards: Bronson Ing  Admit date: 01/20/2018 Discharge date: 01/23/2018  Admitted From: Home  Disposition: Home   Recommendations for Outpatient Follow-up:  1. Follow up with PCP in 2 weeks 2. Please obtain BMP/CBC in 2 weeks 3. Pt was given instructions to hold xarelto 4 days and restart 01/27/18.  4. Follow up with GI as scheduled in next 1 week.  5. Metoprolol was discontinued due to low blood pressures, continue diltiazem for rate control.   Discharge Condition: STABLE   CODE STATUS: FULL    Brief Hospitalization Summary: Please see all hospital notes, images, labs for full details of the hospitalization. HPI:  Eileen [name] is a 72 year old female who presented to the emergency department this morning with with complaints  of GI bleed for 1 week that got worse this morning with blood clots and feeling generally weak associated with abdominal discomfort.  She had informed her primary care provider about this who had referred her to GI for this coming Friday or Thursday.  Patient denies any prior history of rectal or GI bleed.  She also noticed that she is been having increased shortness of breath with severe dyspnea on exertion and has been having some cough with congestion over the past couple of days she is COPD with dependent oxygen at 2 to 3 L/min at home.  She also has atrial fibrillation and is on Xarelto  ED Course: She was noted to be severely dyspneic and tachycardic and with mild exertion her oxygen went down to 80%.  Patient was hypotensive and IV hydration with 500 mls of normal saline bolus.    1. acute GI bleed with no prior historyof GI bleed. Hemoglobin down to 7.2.  She has been typed and crossmatched.  Pt is s/p 2 units PRBC and Hg improved to 9.6.   Patient had colonoscopy with Dr. Laural Golden and mild hemorrhoids thought to be cause of GI  bleeding.  Pt prescribed 2 weeks of hydrocortisone suppositories and fiber supplements.   2. COPD exacerbation on chronic O2 therapy.  Improved on  IV Solu-Medrol as well as nebulizer treatments. Discharge home on prednisone taper and oral azithromycin to finish treatment course.    3. Paroxysmal atrial fibrillation - Hold xarelto for 4 days and restart on 01/27/18.  Resume other home meds. DC metoprolol due to soft blood pressures.  Continue diltiazem for rate control.     4. Hypertension - Discontinued metoprolol due to soft BPs.  Continue diltiazem.    5. Type 2 diabetes mellitus non-insulin-dependent she is being resumed on home medications.   6. Leukocytosis secondary to IV steroids.     DVT prophylaxis:SCD Code Status:Full Family Communication:2 daughters updated Consults called:GI  Brief colonoscopy note:  Removed from the cecum. Excellent prep. Diverticula in sigmoid colon. Minimal external and internal hemorrhoids felt to be source of GI bleed.  No active bleeding noted.  Discharge Diagnoses:  Principal Problem:   Acute upper GI bleeding Active Problems:   COPD exacerbation (HCC)   Essential hypertension   Diabetes mellitus ([place])   Obstructive chronic bronchitis with exacerbation (HCC)   Lower GI bleed  Discharge Instructions: Discharge Instructions    Call MD for:  difficulty breathing, headache or visual disturbances   Complete by:  As directed    Call MD for:  extreme fatigue   Complete by:  As directed    Call MD  for:  persistant dizziness or light-headedness   Complete by:  As directed    Call MD for:  persistant nausea and vomiting   Complete by:  As directed    Diet - low sodium heart healthy   Complete by:  As directed    Increase activity slowly   Complete by:  As directed      Allergies as of 01/23/2018      Reactions   Codeine Other (See Comments)   "jittery"      Medication List    TAKE these medications   acetaminophen 500 MG  tablet Commonly known as:  TYLENOL Take 500 mg by mouth every 8 (eight) hours as needed for mild pain or headache.   azithromycin 500 MG tablet Commonly known as:  ZITHROMAX Take 1 tablet (500 mg total) by mouth daily.   blood glucose meter kit and supplies Kit Dispense based on patient and insurance preference. Use up to four times daily as directed. (FOR ICD-9 250.00, 250.01).  Check sugar before meals and at night, keep a log.   diltiazem 240 MG 24 hr capsule Commonly known as:  CARDIZEM CD TAKE ONE CAPSULE BY MOUTH DAILY.   guaiFENesin 600 MG 12 hr tablet Commonly known as:  MUCINEX Take 1 tablet (600 mg total) by mouth 2 (two) times daily.   hydrocortisone 25 MG suppository Commonly known as:  ANUSOL-HC Place 1 suppository (25 mg total) rectally daily for 14 days.   metFORMIN 500 MG 24 hr tablet Commonly known as:  GLUCOPHAGE-XR Take 500 mg by mouth daily.   metoprolol tartrate 50 MG tablet Commonly known as:  LOPRESSOR Take 1 tablet (50 mg total) by mouth 2 (two) times daily.   OXYGEN Inhale 2.5-3 L into the lungs daily. At night and daily as needed   predniSONE 20 MG tablet Commonly known as:  DELTASONE Take 3 PO QAM x3days, 2 PO QAM x3days, 1 PO QAM x3days Start taking on:  01/24/2018 What changed:    medication strength  additional instructions   albuterol (2.5 MG/3ML) 0.083% nebulizer solution Commonly known as:  PROVENTIL Take 2.5 mg by nebulization every 6 (six) hours as needed for wheezing or shortness of breath.   PROAIR HFA 108 (90 Base) MCG/ACT inhaler Generic drug:  albuterol Inhale 2 puffs into the lungs every 6 (six) hours as needed for wheezing or shortness of breath.   psyllium 95 % Pack Commonly known as:  HYDROCIL/METAMUCIL Take 1 packet by mouth daily. Start taking on:  01/24/2018   rivaroxaban 20 MG Tabs tablet Commonly known as:  XARELTO Take 1 tablet (20 mg total) by mouth daily with supper. Start taking on:  01/27/2018 What  changed:  These instructions start on 01/27/2018. If you are unsure what to do until then, ask your doctor or other care provider.   telmisartan-hydrochlorothiazide 80-12.5 MG tablet Commonly known as:  MICARDIS HCT Take 1 tablet by mouth daily.   TRELEGY ELLIPTA 100-62.5-25 MCG/INH Aepb Generic drug:  Fluticasone-Umeclidin-Vilant Take 1 puff by mouth daily.   VITAMIN C PO Take 1 tablet by mouth daily.   Vitamin D (Ergocalciferol) 1.25 MG (50000 UT) Caps capsule Commonly known as:  DRISDOL Take 50,000 Units every 30 (thirty) days by mouth.      Follow-up Information    Lavella Lemons, [place]. Schedule an appointment as soon as possible for a visit in 2 week(s).   Specialty:  Physician Assistant Why:  Hospital Follow Up  Contact information: [address]  [place] [address] [phone]        Herminio Commons, MD .   Specialty:  Cardiology Contact information: [place] [address] [phone]          Allergies  Allergen Reactions  . Codeine Other (See Comments)    "jittery"   Allergies as of 01/23/2018      Reactions   Codeine Other (See Comments)   "jittery"      Medication List    TAKE these medications   acetaminophen 500 MG tablet Commonly known as:  TYLENOL Take 500 mg by mouth every 8 (eight) hours as needed for mild pain or headache.   azithromycin 500 MG tablet Commonly known as:  ZITHROMAX Take 1 tablet (500 mg total) by mouth daily.   blood glucose meter kit and supplies Kit Dispense based on patient and insurance preference. Use up to four times daily as directed. (FOR ICD-9 250.00, 250.01).  Check sugar before meals and at night, keep a log.   diltiazem 240 MG 24 hr capsule Commonly known as:  CARDIZEM CD TAKE ONE CAPSULE BY MOUTH DAILY.   guaiFENesin 600 MG 12 hr tablet Commonly known as:  MUCINEX Take 1 tablet (600 mg total) by mouth 2 (two) times daily.   hydrocortisone 25 MG suppository Commonly  known as:  ANUSOL-HC Place 1 suppository (25 mg total) rectally daily for 14 days.   metFORMIN 500 MG 24 hr tablet Commonly known as:  GLUCOPHAGE-XR Take 500 mg by mouth daily.   metoprolol tartrate 50 MG tablet Commonly known as:  LOPRESSOR Take 1 tablet (50 mg total) by mouth 2 (two) times daily.   OXYGEN Inhale 2.5-3 L into the lungs daily. At night and daily as needed   predniSONE 20 MG tablet Commonly known as:  DELTASONE Take 3 PO QAM x3days, 2 PO QAM x3days, 1 PO QAM x3days Start taking on:  01/24/2018 What changed:    medication strength  additional instructions   albuterol (2.5 MG/3ML) 0.083% nebulizer solution Commonly known as:  PROVENTIL Take 2.5 mg by nebulization every 6 (six) hours as needed for wheezing or shortness of breath.   PROAIR HFA 108 (90 Base) MCG/ACT inhaler Generic drug:  albuterol Inhale 2 puffs into the lungs every 6 (six) hours as needed for wheezing or shortness of breath.   psyllium 95 % Pack Commonly known as:  HYDROCIL/METAMUCIL Take 1 packet by mouth daily. Start taking on:  01/24/2018   rivaroxaban 20 MG Tabs tablet Commonly known as:  XARELTO Take 1 tablet (20 mg total) by mouth daily with supper. Start taking on:  01/27/2018 What changed:  These instructions start on 01/27/2018. If you are unsure what to do until then, ask your doctor or other care provider.   telmisartan-hydrochlorothiazide 80-12.5 MG tablet Commonly known as:  MICARDIS HCT Take 1 tablet by mouth daily.   TRELEGY ELLIPTA 100-62.5-25 MCG/INH Aepb Generic drug:  Fluticasone-Umeclidin-Vilant Take 1 puff by mouth daily.   VITAMIN C PO Take 1 tablet by mouth daily.   Vitamin D (Ergocalciferol) 1.25 MG (50000 UT) Caps capsule Commonly known as:  DRISDOL Take 50,000 Units every 30 (thirty) days by mouth.       Procedures/Studies: Dg Chest Port 1 View  Result Date: 01/20/2018 CLINICAL DATA:  Acute onset of bright red bleeding from the rectum.  Lightheadedness and dizziness. Lower abdominal pain. EXAM: PORTABLE CHEST 1 VIEW COMPARISON:  Chest radiograph performed 09/17/2017 FINDINGS: A small left pleural  effusion is suspected. Mild left basilar airspace opacity likely reflects atelectasis. No pneumothorax is seen. The cardiomediastinal silhouette is borderline normal in size. No acute osseous abnormalities are identified. IMPRESSION: Suspect small left pleural effusion. Mild left basilar airspace opacity likely reflects atelectasis. Electronically Signed   By: Garald Balding M.D.   On: 01/20/2018 06:07      Subjective: Pt without complaints.  Pt says she feels better after blood transfusion.    Discharge Exam: Vitals:   01/23/18 0738 01/23/18 0852  BP:    Pulse:    Resp:    Temp:    SpO2: 93% 95%   Vitals:   01/22/18 2137 01/23/18 0706 01/23/18 0738 01/23/18 0852  BP: (!) 113/56 (!) 107/55    Pulse: 98 77    Resp: 18 19    Temp: (!) 97.5 F (36.4 C) 98 F (36.7 C)    TempSrc: Oral Oral    SpO2: 94% 93% 93% 95%  Weight:      Height:       General: Pt is alert, awake, not in acute distress Cardiovascular: RRR, S1/S2 +, no rubs, no gallops Respiratory: mostly clear, better air movement, occasional exp wheezes at bases.  Abdominal: Soft, NT, ND, bowel sounds + Extremities: no edema, no cyanosis   The results of significant diagnostics from this hospitalization (including imaging, microbiology, ancillary and laboratory) are listed below for reference.     Microbiology: No results found for this or any previous visit (from the past 240 hour(s)).   Labs: BNP (last 3 results) Recent Labs    07/17/17 1725 01/20/18 0609  BNP 196.0* 893.7*   Basic Metabolic Panel: Recent Labs  Lab 01/20/18 0609 01/21/18 0601 01/23/18 0500  NA 140 139 140  K 4.2 4.5 4.2  CL 100 102 101  CO2 31 31 32  GLUCOSE 193* 185* 195*  BUN 15 12 18   CREATININE 0.71 0.62 0.70  CALCIUM 9.3 8.6* 8.6*   Liver Function Tests: Recent Labs   Lab 01/20/18 0609 01/21/18 0601  AST 21 18  ALT 14 14  ALKPHOS 52 28*  BILITOT 0.5 0.7  PROT 6.4* 5.5*  ALBUMIN 3.9 3.4*   No results for input(s): LIPASE, AMYLASE in the last 168 hours. No results for input(s): AMMONIA in the last 168 hours. CBC: Recent Labs  Lab 01/20/18 0609 01/20/18 1500 01/22/18 0509 01/22/18 1709 01/23/18 0500  WBC 15.4* 12.0* 16.4*  --  15.5*  NEUTROABS 10.9*  --   --   --   --   HGB 11.1* 9.2* 7.2* 11.0* 9.6*  HCT 36.6 31.3* 24.4* 36.3 30.6*  MCV 98.1 98.1 96.8  --  95.0  PLT 244 250 234  --  212   Cardiac Enzymes: No results for input(s): CKTOTAL, CKMB, CKMBINDEX, TROPONINI in the last 168 hours. BNP: Invalid input(s): POCBNP CBG: Recent Labs  Lab 01/22/18 0754 01/22/18 1142 01/22/18 1618 01/22/18 2138 01/23/18 0813  GLUCAP 169* 137* 280* 126* 160*   D-Dimer No results for input(s): DDIMER in the last 72 hours. Hgb A1c No results for input(s): HGBA1C in the last 72 hours. Lipid Profile No results for input(s): CHOL, HDL, LDLCALC, TRIG, CHOLHDL, LDLDIRECT in the last 72 hours. Thyroid function studies No results for input(s): TSH, T4TOTAL, T3FREE, THYROIDAB in the last 72 hours.  Invalid input(s): FREET3 Anemia work up No results for input(s): VITAMINB12, FOLATE, FERRITIN, TIBC, IRON, RETICCTPCT in the last 72 hours. Urinalysis    Component Value Date/Time   COLORURINE  YELLOW 09/09/2009 1010   APPEARANCEUR HAZY (A) 09/09/2009 1010   LABSPEC 1.030 09/09/2009 1010   PHURINE 5.5 09/09/2009 1010   GLUCOSEU NEGATIVE 09/09/2009 1010   HGBUR NEGATIVE 09/09/2009 1010   BILIRUBINUR NEGATIVE 09/09/2009 1010   KETONESUR NEGATIVE 09/09/2009 1010   PROTEINUR NEGATIVE 09/09/2009 1010   UROBILINOGEN 0.2 09/09/2009 1010   NITRITE NEGATIVE 09/09/2009 1010   LEUKOCYTESUR  09/09/2009 1010    NEGATIVE MICROSCOPIC NOT DONE ON URINES WITH NEGATIVE PROTEIN, BLOOD, LEUKOCYTES, NITRITE, OR GLUCOSE <1000 mg/dL.   Sepsis Labs Invalid input(s):  PROCALCITONIN,  WBC,  LACTICIDVEN Microbiology No results found for this or any previous visit (from the past 240 hour(s)).  Time coordinating discharge: 34 minutes   SIGNED:  Irwin Brakeman, MD  [facility] 01/23/2018, 9:46 AM Pager [phone]  If 7PM-7AM, please contact night-coverage [email or web address] Password [email or web address]

## 2018-01-23 NOTE — Discharge Instructions (Signed)
Please hold your xarelto for 4 days, restart on 01/27/18 Seek medical care or return to ER if symptoms come back, worsen or new problem develops.    Gastrointestinal Bleeding Gastrointestinal bleeding is bleeding somewhere along the path food travels through the body (digestive tract). This path is anywhere between the mouth and the opening of the butt (anus). You may have blood in your poop (stools) or have black poop. If you throw up (vomit), there may be blood in it. This condition can be mild, serious, or even life-threatening. If you have a lot of bleeding, you may need to stay in the hospital. Follow these instructions at home:  Take over-the-counter and prescription medicines only as told by your doctor.  Eat foods that have a lot of fiber in them. These foods include whole grains, fruits, and vegetables. You can also try eating 1-3 prunes each day.  Drink enough fluid to keep your pee (urine) clear or pale yellow.  Keep all follow-up visits as told by your doctor. This is important. Contact a doctor if:  Your symptoms do not get better. Get help right away if:  Your bleeding gets worse.  You feel dizzy or you pass out (faint).  You feel weak.  You have very bad cramps in your back or belly (abdomen).  You pass large clumps of blood (clots) in your poop.  Your symptoms are getting worse. This information is not intended to replace advice given to you by your health care provider. Make sure you discuss any questions you have with your health care provider. Document Released: 12/07/2007 Document Revised: 08/05/2015 Document Reviewed: 08/17/2014 Elsevier Interactive Patient Education  2018 [place].  Bleeding Precautions When on Anticoagulant Therapy WHAT IS ANTICOAGULANT THERAPY? Anticoagulant therapy is taking medicine to prevent or reduce blood clots. It is also called blood thinner therapy. Blood clots that form in your blood vessels can be dangerous. They can  break loose and travel to your heart, lungs, or brain. This increases your risk of a heart attack or stroke. Anticoagulant therapy causes blood to clot more slowly. You may need anticoagulant therapy if you have:  A medical condition that increases the likelihood that blood clots will form.  A heart defect or a problem with heart rhythm. It is also a common treatment after heart surgery, such as valve replacement. WHAT ARE COMMON TYPES OF ANTICOAGULANT THERAPY? Anticoagulant medicine can be injected or taken by mouth.If you need anticoagulant therapy quickly at the hospital, the medicine may be injected under your skin or given through an IV tube. Heparin is a common example of an anticoagulant that you may get at the hospital. Most anticoagulant therapy is in the form of pills that you take at home every day. These may include:  Aspirin. This common blood thinner works by preventing blood cells (platelets) from sticking together to form a clot. Aspirin is not as strong as anticoagulants that slow down the time that it takes for your body to form a clot.  Clopidogrel. This is a newer type of drug that affects platelets. It is stronger than aspirin.  Warfarin. This is the most common anticoagulant. It changes the way your body uses vitamin K, a vitamin that helps your blood to clot. The risk of bleeding is higher with warfarin than with aspirin. You will need frequent blood tests to make sure you are taking the safest amount.  New anticoagulants. Several new drugs have been approved. They are all taken by mouth. Studies show  that these drugs work as well as warfarin. They do not require blood testing. They may cause less bleeding risk than warfarin. WHAT DO I NEED TO REMEMBER WHEN TAKING ANTICOAGULANT THERAPY? Anticoagulant therapy decreases your risk of forming a blood clot, but it increases your risk of bleeding. Work closely with your health care provider to make sure you are taking your  medicine safely. These tips can help:  Learn ways to reduce your risk of bleeding.  If you are taking warfarin: ? Have blood tests as ordered by your health care provider. ? Do not make any sudden changes to your diet. Vitamin K in your diet can make warfarin less effective. ? Do not get pregnant. This medicine may cause birth defects.  Take your medicine at the same time every day. If you forget to take your medicine, take it as soon as you remember. If you miss a whole day, do not double your dose of medicine. Take your normal dose and call your health care provider to check in.  Do not stop taking your medicine on your own.  Tell your health care provider before you start taking any new medicine, vitamin, or herbal product. Some of these could interfere with your therapy.  Tell all of your health care providers that you are on anticoagulant therapy.  Do not have surgery, medical procedures, or dental work until you tell your health care provider that you are on anticoagulant therapy. WHAT CAN AFFECT HOW ANTICOAGULANTS WORK? Certain foods, vitamins, medicines, supplements, and herbal medicines change the way that anticoagulant therapy works. They may increase or decrease the effects of your anticoagulant therapy. Either result can be dangerous for you.  Many over-the-counter medicines for pain, colds, or stomach problems interfere with anticoagulant therapy. Take these only as told by your health care provider.  Do not drink alcohol. It can interfere with your medicine and increase your risk of an injury that causes bleeding.  If you are taking warfarin, do not begin eating more foods that contain vitamin K. These include leafy green vegetables. Ask your health care provider if you should avoid any foods. WHAT ARE SOME WAYS TO PREVENT BLEEDING? You can prevent bleeding by taking certain precautions:  Be extra careful when you use knives, scissors, or other sharp objects.  Use an  electric razor instead of a blade.  Do not use toothpicks.  Use a soft toothbrush.  Wear shoes that have nonskid soles.  Use bath mats and handrails in your bathroom.  Wear gloves while you do yard work.  Wear a helmet when you ride a bike.  Wear your seat belt.  Prevent falls by removing loose rugs and extension cords from areas where you walk.  Do not play contact sports or participate in other activities that have a high risk of injury. [place] PROVIDER? Call your health care provider if:  You miss a dose of medicine: ? And you are not sure what to do. ? For more than one day.  You have: ? Menstrual bleeding that is heavier than normal. ? Blood in your urine. ? A bloody nose or bleeding gums. ? Easy bruising. ? Blood in your stool (feces) or have black and tarry stool. ? Side effects from your medicine.  You feel weak or dizzy.  You become pregnant. Seek immediate medical care if:  You have bleeding that will not stop.  You have sudden and severe headache or belly pain.  You vomit  or you cough up bright red blood.  You have a severe blow to your head. WHAT ARE SOME QUESTIONS TO ASK MY HEALTH CARE PROVIDER?  What is the best anticoagulant therapy for my condition?  What side effects should I watch for?  When should I take my medicine? What should I do if I forget to take it?  Will I need to have regular blood tests?  Do I need to change my diet? Are there foods or drinks that I should avoid?  What activities are safe for me?  What should I do if I want to get pregnant? This information is not intended to replace advice given to you by your health care provider. Make sure you discuss any questions you have with your health care provider. Document Released: 02/08/2015 Document Reviewed: 02/08/2015 Elsevier Interactive Patient Education  2017 [employer].

## 2018-01-23 NOTE — Care Management Important Message (Signed)
Important Message  Patient Details  Name: Eileen [name] MRN: [id number] Date of Birth: May 12, 1945   Medicare Important Message Given:  Yes    [name] 01/23/2018, 11:30 AM

## 2018-01-24 ENCOUNTER — Encounter (INDEPENDENT_AMBULATORY_CARE_PROVIDER_SITE_OTHER): Payer: Self-pay | Admitting: Internal Medicine

## 2018-01-24 ENCOUNTER — Ambulatory Visit (INDEPENDENT_AMBULATORY_CARE_PROVIDER_SITE_OTHER): Payer: Medicare Other | Admitting: Internal Medicine

## 2018-01-25 ENCOUNTER — Encounter (HOSPITAL_COMMUNITY): Payer: Self-pay | Admitting: Internal Medicine

## 2018-02-09 DIAGNOSIS — R0902 Hypoxemia: Secondary | ICD-10-CM | POA: Diagnosis not present

## 2018-02-09 DIAGNOSIS — J449 Chronic obstructive pulmonary disease, unspecified: Secondary | ICD-10-CM | POA: Diagnosis not present

## 2018-02-14 ENCOUNTER — Other Ambulatory Visit: Payer: Self-pay

## 2018-02-14 NOTE — Patient Outreach (Signed)
[place] [facility]) Care Management  02/14/2018  Eileen [name] [birth year] [id number]    Unsuccessful call to the patient for assessment. No answer.  HIPAA compliant voicemail left with contact information.  Plan: [facility] will make outreach attempt the patient within in one month.   [name] RN, BSN, [place] Direct Dial:  [phone]  Fax: [phone]

## 2018-02-15 ENCOUNTER — Other Ambulatory Visit: Payer: Self-pay

## 2018-02-15 NOTE — Patient Outreach (Addendum)
[facility]) Care Management  02/15/2018   [name] Cleavenger [birth year] [id number]  Subjective: Returned call from the patient for assessment.  HIPAA verified.  The patient states that she is doing well.  She feels that her breathing has improved some with the new medication.  She denies any wheezing or falls.  She states that she does have some shortness of breath with exertion and a little cough. She is taking all of her medication.  She states that she received paperwork stating that she may need to fill out forms again for her trelegy inhaler.  I informed the patient that if she has any question about the paper work to call me and I will put her back in contact with the pharmacy department.  She verbalized understanding. The patient reports that she was in the hospital for a bleeding hemorrhoid.  A colonoscopy was performed while in the hospital and they got the bleeding under control.   Current Medications:  Current Outpatient Medications  Medication Sig Dispense Refill  . acetaminophen (TYLENOL) 500 MG tablet Take 500 mg by mouth every 8 (eight) hours as needed for mild pain or headache.     . albuterol (PROVENTIL) (2.5 MG/3ML) 0.083% nebulizer solution Take 2.5 mg by nebulization every 6 (six) hours as needed for wheezing or shortness of breath.    . Ascorbic Acid (VITAMIN C PO) Take 1 tablet by mouth daily.    . blood glucose meter kit and supplies KIT Dispense based on patient and insurance preference. Use up to four times daily as directed. (FOR ICD-9 250.00, 250.01).  Check sugar before meals and at night, keep a log. 1 each 0  . diltiazem (CARDIZEM CD) 240 MG 24 hr capsule TAKE ONE CAPSULE BY MOUTH DAILY. 30 capsule 3  . guaiFENesin (MUCINEX) 600 MG 12 hr tablet Take 1 tablet (600 mg total) by mouth 2 (two) times daily. 30 tablet 0  . metFORMIN (GLUCOPHAGE-XR) 500 MG 24 hr tablet Take 500 mg by mouth daily.  1  . OXYGEN Inhale 2.5-3 L into the lungs daily. At night  and daily as needed    . predniSONE (DELTASONE) 20 MG tablet Take 3 PO QAM x3days, 2 PO QAM x3days, 1 PO QAM x3days 18 tablet 0  . PROAIR HFA 108 (90 BASE) MCG/ACT inhaler Inhale 2 puffs into the lungs every 6 (six) hours as needed for wheezing or shortness of breath.     . psyllium (HYDROCIL/METAMUCIL) 95 % PACK Take 1 packet by mouth daily. 240 each 0  . rivaroxaban (XARELTO) 20 MG TABS tablet Take 1 tablet (20 mg total) by mouth daily with supper. 21 tablet 0  . telmisartan-hydrochlorothiazide (MICARDIS HCT) 80-12.5 MG tablet Take 1 tablet by mouth daily.     . TRELEGY ELLIPTA 100-62.5-25 MCG/INH AEPB Take 1 puff by mouth daily.  0  . Vitamin D, Ergocalciferol, (DRISDOL) 50000 units CAPS capsule Take 50,000 Units every 30 (thirty) days by mouth.    Marland Kitchen azithromycin (ZITHROMAX) 500 MG tablet Take 1 tablet (500 mg total) by mouth daily. (Patient not taking: Reported on 02/15/2018) 5 tablet 0   No current facility-administered medications for this visit.     Functional Status:  In your present state of health, do you have any difficulty performing the following activities: 01/20/2018 10/10/2017  Hearing? N N  Vision? N N  Difficulty concentrating or making decisions? N N  Walking or climbing stairs? Y Y  Comment - -  Dressing or bathing?  N N  Doing errands, shopping? N N  Some recent data might be hidden    Fall/Depression Screening: Fall Risk  02/15/2018 01/14/2018 11/14/2017  Falls in the past year? 0 0 No  Number falls in past yr: - - -  Injury with Fall? - - -  Risk for fall due to : - - -  Follow up - - -   PHQ 2/9 Scores 10/10/2017 10/03/2016  PHQ - 2 Score 2 0  PHQ- 9 Score 6 -    Assessment: Patient will continue to benefit from health coach outreach for disease management and support.  THN CM Care Plan Problem One     Most Recent Value  THN Long Term Goal   In 30 days the patient verbalize no admission for copd exacerbation  THN Long Term Goal Start Date  02/15/18   Interventions for Problem One Long Term Goal  Discussed medications, signs and symptoms oc copd and the action plan.     Plan:  RN Health Coach will contact patient in the month of January and patient agrees to next outreach.    [name] RN, BSN, [place] Direct Dial:  [phone]  Fax: [phone]

## 2018-02-27 ENCOUNTER — Ambulatory Visit: Payer: Self-pay

## 2018-03-11 DIAGNOSIS — I4892 Unspecified atrial flutter: Secondary | ICD-10-CM | POA: Diagnosis not present

## 2018-03-11 DIAGNOSIS — J441 Chronic obstructive pulmonary disease with (acute) exacerbation: Secondary | ICD-10-CM | POA: Diagnosis not present

## 2018-03-11 DIAGNOSIS — I10 Essential (primary) hypertension: Secondary | ICD-10-CM | POA: Diagnosis not present

## 2018-03-11 DIAGNOSIS — I480 Paroxysmal atrial fibrillation: Secondary | ICD-10-CM | POA: Diagnosis not present

## 2018-03-12 DIAGNOSIS — J449 Chronic obstructive pulmonary disease, unspecified: Secondary | ICD-10-CM | POA: Diagnosis not present

## 2018-03-12 DIAGNOSIS — R0902 Hypoxemia: Secondary | ICD-10-CM | POA: Diagnosis not present

## 2018-03-20 DIAGNOSIS — J449 Chronic obstructive pulmonary disease, unspecified: Secondary | ICD-10-CM | POA: Diagnosis not present

## 2018-03-20 DIAGNOSIS — R42 Dizziness and giddiness: Secondary | ICD-10-CM | POA: Diagnosis not present

## 2018-03-20 DIAGNOSIS — I480 Paroxysmal atrial fibrillation: Secondary | ICD-10-CM | POA: Diagnosis not present

## 2018-03-22 ENCOUNTER — Other Ambulatory Visit: Payer: Self-pay

## 2018-03-22 NOTE — Patient Outreach (Signed)
[facility]) Care Management  03/22/2018   [name] Bussey [birth year] [id number]    Subjective: Received call from the patient for assessment. HIPAA verified.  The patient states that she is doing well.  She denies any pain, falls, or wheezing.  She states that she does have some shortness of breath but nothing more than usual.    She states that she had been having some dizziness and saw her physician they gave her medication and she is doing better.  She is monitoring her diet and taking her medications as prescribed.   Current Medications:  Current Outpatient Medications  Medication Sig Dispense Refill  . acetaminophen (TYLENOL) 500 MG tablet Take 500 mg by mouth every 8 (eight) hours as needed for mild pain or headache.     . albuterol (PROVENTIL) (2.5 MG/3ML) 0.083% nebulizer solution Take 2.5 mg by nebulization every 6 (six) hours as needed for wheezing or shortness of breath.    . Ascorbic Acid (VITAMIN C PO) Take 1 tablet by mouth daily.    . blood glucose meter kit and supplies KIT Dispense based on patient and insurance preference. Use up to four times daily as directed. (FOR ICD-9 250.00, 250.01).  Check sugar before meals and at night, keep a log. 1 each 0  . diltiazem (CARDIZEM CD) 240 MG 24 hr capsule TAKE ONE CAPSULE BY MOUTH DAILY. 30 capsule 3  . guaiFENesin (MUCINEX) 600 MG 12 hr tablet Take 1 tablet (600 mg total) by mouth 2 (two) times daily. 30 tablet 0  . metFORMIN (GLUCOPHAGE-XR) 500 MG 24 hr tablet Take 500 mg by mouth daily.  1  . OXYGEN Inhale 2.5-3 L into the lungs daily. At night and daily as needed    . predniSONE (DELTASONE) 20 MG tablet Take 3 PO QAM x3days, 2 PO QAM x3days, 1 PO QAM x3days 18 tablet 0  . PROAIR HFA 108 (90 BASE) MCG/ACT inhaler Inhale 2 puffs into the lungs every 6 (six) hours as needed for wheezing or shortness of breath.     . psyllium (HYDROCIL/METAMUCIL) 95 % PACK Take 1 packet by mouth daily. 240 each 0  . rivaroxaban  (XARELTO) 20 MG TABS tablet Take 1 tablet (20 mg total) by mouth daily with supper. 21 tablet 0  . telmisartan-hydrochlorothiazide (MICARDIS HCT) 80-12.5 MG tablet Take 1 tablet by mouth daily.     . TRELEGY ELLIPTA 100-62.5-25 MCG/INH AEPB Take 1 puff by mouth daily.  0  . azithromycin (ZITHROMAX) 500 MG tablet Take 1 tablet (500 mg total) by mouth daily. (Patient not taking: Reported on 02/15/2018) 5 tablet 0  . Vitamin D, Ergocalciferol, (DRISDOL) 50000 units CAPS capsule Take 50,000 Units every 30 (thirty) days by mouth.     No current facility-administered medications for this visit.     Functional Status:  In your present state of health, do you have any difficulty performing the following activities: 01/20/2018 10/10/2017  Hearing? N N  Vision? N N  Difficulty concentrating or making decisions? N N  Walking or climbing stairs? Y Y  Comment - -  Dressing or bathing? N N  Doing errands, shopping? N N  Some recent data might be hidden    Fall/Depression Screening: Fall Risk  03/22/2018 02/15/2018 01/14/2018  Falls in the past year? 0 0 0  Number falls in past yr: - - -  Injury with Fall? - - -  Risk for fall due to : - - -  Follow up - - -  PHQ 2/9 Scores 10/10/2017 10/03/2016  PHQ - 2 Score 2 0  PHQ- 9 Score 6 -    Assessment: Patient will continue to benefit from health coach outreach for disease management and support.  THN CM Care Plan Problem One     Most Recent Value  THN Long Term Goal   In 30 days the patient verbalize no admission for copd exacerbation  THN Long Term Goal Start Date  03/22/18  Interventions for Problem One Long Term Goal  Reviewed signs and sypmtoms of copd, copd action plan and discussed medications       Plan: RN Health Coach will contact patient in the month of February and patient agrees to next outreach.   [name] RN, BSN, [place] Direct Dial:  [phone]  Fax:  [phone]

## 2018-03-22 NOTE — Patient Outreach (Signed)
[facility]) Care Management  03/22/2018  Eileen [name] [birth year] [id number]    1st outreach attempt to the patient for assessment. No answer. HIPAA compliant voicemail left with contact information.  Plan: RN Health Coach will send letter. [place] will make outreach attempt the patient within thirty days.  [name] RN, BSN, [place] Direct Dial:  [phone]  Fax: [phone]

## 2018-04-11 DIAGNOSIS — J449 Chronic obstructive pulmonary disease, unspecified: Secondary | ICD-10-CM | POA: Diagnosis not present

## 2018-04-11 DIAGNOSIS — R0902 Hypoxemia: Secondary | ICD-10-CM | POA: Diagnosis not present

## 2018-04-12 DIAGNOSIS — R0902 Hypoxemia: Secondary | ICD-10-CM | POA: Diagnosis not present

## 2018-04-12 DIAGNOSIS — J449 Chronic obstructive pulmonary disease, unspecified: Secondary | ICD-10-CM | POA: Diagnosis not present

## 2018-04-23 ENCOUNTER — Other Ambulatory Visit: Payer: Self-pay

## 2018-04-23 NOTE — Patient Outreach (Addendum)
[place] [facility]) Care Management  04/23/2018   [name] Coward [birth year] [id number]  Subjective: Received return call from the patient.  HIPAA verified.  The patient states that she is doing well.  She denies any wheezing, cough, or phlegm.  She states that she does have shortness of breath but that is usual for her.  She states that she takes her medications.  She is trying to do some exercises in the home.  She states that she goes to the cancer center to here speakers discuss about different topics and she will also try to chair exercises.  The patient states does not have an appointment scheduled for follow up but states she will call.   Current Medications:  Current Outpatient Medications  Medication Sig Dispense Refill  . acetaminophen (TYLENOL) 500 MG tablet Take 500 mg by mouth every 8 (eight) hours as needed for mild pain or headache.     . albuterol (PROVENTIL) (2.5 MG/3ML) 0.083% nebulizer solution Take 2.5 mg by nebulization every 6 (six) hours as needed for wheezing or shortness of breath.    . Ascorbic Acid (VITAMIN C PO) Take 1 tablet by mouth daily.    . blood glucose meter kit and supplies KIT Dispense based on patient and insurance preference. Use up to four times daily as directed. (FOR ICD-9 250.00, 250.01).  Check sugar before meals and at night, keep a log. 1 each 0  . diltiazem (CARDIZEM CD) 240 MG 24 hr capsule TAKE ONE CAPSULE BY MOUTH DAILY. 30 capsule 3  . guaiFENesin (MUCINEX) 600 MG 12 hr tablet Take 1 tablet (600 mg total) by mouth 2 (two) times daily. 30 tablet 0  . metFORMIN (GLUCOPHAGE-XR) 500 MG 24 hr tablet Take 500 mg by mouth daily.  1  . OXYGEN Inhale 2.5-3 L into the lungs daily. At night and daily as needed    . predniSONE (DELTASONE) 20 MG tablet Take 3 PO QAM x3days, 2 PO QAM x3days, 1 PO QAM x3days 18 tablet 0  . PROAIR HFA 108 (90 BASE) MCG/ACT inhaler Inhale 2 puffs into the lungs every 6 (six) hours as needed for wheezing or  shortness of breath.     . psyllium (HYDROCIL/METAMUCIL) 95 % PACK Take 1 packet by mouth daily. 240 each 0  . rivaroxaban (XARELTO) 20 MG TABS tablet Take 1 tablet (20 mg total) by mouth daily with supper. 21 tablet 0  . telmisartan-hydrochlorothiazide (MICARDIS HCT) 80-12.5 MG tablet Take 1 tablet by mouth daily.     . TRELEGY ELLIPTA 100-62.5-25 MCG/INH AEPB Take 1 puff by mouth daily.  0  . Vitamin D, Ergocalciferol, (DRISDOL) 50000 units CAPS capsule Take 50,000 Units every 30 (thirty) days by mouth.    Marland Kitchen azithromycin (ZITHROMAX) 500 MG tablet Take 1 tablet (500 mg total) by mouth daily. (Patient not taking: Reported on 02/15/2018) 5 tablet 0   No current facility-administered medications for this visit.     Functional Status:  In your present state of health, do you have any difficulty performing the following activities: 01/20/2018 10/10/2017  Hearing? N N  Vision? N N  Difficulty concentrating or making decisions? N N  Walking or climbing stairs? Y Y  Comment - -  Dressing or bathing? N N  Doing errands, shopping? N N  Some recent data might be hidden    Fall/Depression Screening: Fall Risk  04/23/2018 03/22/2018 02/15/2018  Falls in the past year? 0 0 0  Number falls in past yr: - - -  Injury with Fall? - - -  Risk for fall due to : - - -  Follow up - - -   PHQ 2/9 Scores 10/10/2017 10/03/2016  PHQ - 2 Score 2 0  PHQ- 9 Score 6 -    Assessment: Patient will continue to benefit from health coach outreach for disease management and support.  THN CM Care Plan Problem One     Most Recent Value  Care Plan for Problem One  Active  THN Long Term Goal   In 30 days the patient verbalize no admission for copd exacerbation  THN Long Term Goal Start Date  04/23/18  Interventions for Problem One Long Term Goal  Discussed COPD action plan and signs and symptom, who to contact if any problems, exercise and medication adherence.       Plan:  RN Health Coach will contact patient in  the month of March and patient agrees to next outreach.   [name] RN, BSN, [place] Direct Dial:  [phone]  Fax: [phone]

## 2018-04-23 NOTE — Patient Outreach (Signed)
[place] [facility]) Care Management  04/23/2018  [name] Roa 11/27/1945 [id number]    1st unsuccessful attempt to outreach the patient for assessment.  No answer.  HIPAA compliant voicemail left with contact information.  Plan: RN Health Coach will send letter. [place] will make outreach attempt the patient within thirty days.  [name] RN, BSN, [place] Direct Dial:  [phone]  Fax: [phone]

## 2018-05-11 DIAGNOSIS — J449 Chronic obstructive pulmonary disease, unspecified: Secondary | ICD-10-CM | POA: Diagnosis not present

## 2018-05-11 DIAGNOSIS — R0902 Hypoxemia: Secondary | ICD-10-CM | POA: Diagnosis not present

## 2018-05-16 DIAGNOSIS — I10 Essential (primary) hypertension: Secondary | ICD-10-CM | POA: Diagnosis not present

## 2018-05-16 DIAGNOSIS — I4892 Unspecified atrial flutter: Secondary | ICD-10-CM | POA: Diagnosis not present

## 2018-05-16 DIAGNOSIS — J441 Chronic obstructive pulmonary disease with (acute) exacerbation: Secondary | ICD-10-CM | POA: Diagnosis not present

## 2018-05-16 DIAGNOSIS — I480 Paroxysmal atrial fibrillation: Secondary | ICD-10-CM | POA: Diagnosis not present

## 2018-05-20 ENCOUNTER — Other Ambulatory Visit: Payer: Self-pay | Admitting: Cardiovascular Disease

## 2018-05-22 ENCOUNTER — Other Ambulatory Visit: Payer: Self-pay

## 2018-05-22 NOTE — Patient Outreach (Signed)
[place] [facility]) Care Management  05/22/2018  Eileen [name] October 08, 1945 [id number]    1st unsuccessful attempt to outreach the patient for assessment.  No answer.  HIPAA compliant voicemail left with contact information.  Plan: RN Health Coach will make another outreach attempt to the patient within thirty business days.  [name] RN, BSN, [place] Direct Dial:  [phone]  Fax: [phone]

## 2018-05-22 NOTE — Patient Outreach (Signed)
[place] [facility]) Care Management  05/22/2018   Eileen [name] 1946/01/08 [id number]  Subjective:   Successful outreach to the patient for assessment. HIPAA verified.  The patient states that last week she went to her physician office because she had been short of breath more than usual.  She was not running any fever.  She was given a prescription for Azithromycin and has completed the course. She states she does feel some better. She denies any other symptoms.  Discussed with the patient about signs and symptoms and action plan for copd and she verbalized understanding. Also went over prevention methods for the coronavirus.  The patient verbalized understanding.  She states that she is taking her medications as prescribed and drinking water.  She states that she will follow up with pulmonary as needed.   Current Medications:  Current Outpatient Medications  Medication Sig Dispense Refill  . acetaminophen (TYLENOL) 500 MG tablet Take 500 mg by mouth every 8 (eight) hours as needed for mild pain or headache.     . albuterol (PROVENTIL) (2.5 MG/3ML) 0.083% nebulizer solution Take 2.5 mg by nebulization every 6 (six) hours as needed for wheezing or shortness of breath.    . Ascorbic Acid (VITAMIN C PO) Take 1 tablet by mouth daily.    . blood glucose meter kit and supplies KIT Dispense based on patient and insurance preference. Use up to four times daily as directed. (FOR ICD-9 250.00, 250.01).  Check sugar before meals and at night, keep a log. 1 each 0  . diltiazem (CARDIZEM CD) 240 MG 24 hr capsule TAKE ONE CAPSULE BY MOUTH DAILY. 90 capsule 1  . guaiFENesin (MUCINEX) 600 MG 12 hr tablet Take 1 tablet (600 mg total) by mouth 2 (two) times daily. 30 tablet 0  . metFORMIN (GLUCOPHAGE-XR) 500 MG 24 hr tablet Take 500 mg by mouth daily.  1  . OXYGEN Inhale 2.5-3 L into the lungs daily. At night and daily as needed    . predniSONE (DELTASONE) 20 MG tablet Take 3 PO QAM x3days, 2 PO  QAM x3days, 1 PO QAM x3days 18 tablet 0  . PROAIR HFA 108 (90 BASE) MCG/ACT inhaler Inhale 2 puffs into the lungs every 6 (six) hours as needed for wheezing or shortness of breath.     . psyllium (HYDROCIL/METAMUCIL) 95 % PACK Take 1 packet by mouth daily. 240 each 0  . rivaroxaban (XARELTO) 20 MG TABS tablet Take 1 tablet (20 mg total) by mouth daily with supper. 21 tablet 0  . telmisartan-hydrochlorothiazide (MICARDIS HCT) 80-12.5 MG tablet Take 1 tablet by mouth daily.     . TRELEGY ELLIPTA 100-62.5-25 MCG/INH AEPB Take 1 puff by mouth daily.  0  . Vitamin D, Ergocalciferol, (DRISDOL) 50000 units CAPS capsule Take 50,000 Units every 30 (thirty) days by mouth.    Marland Kitchen azithromycin (ZITHROMAX) 500 MG tablet Take 1 tablet (500 mg total) by mouth daily. (Patient not taking: Reported on 02/15/2018) 5 tablet 0   No current facility-administered medications for this visit.     Functional Status:  In your present state of health, do you have any difficulty performing the following activities: 01/20/2018 10/10/2017  Hearing? N N  Vision? N N  Difficulty concentrating or making decisions? N N  Walking or climbing stairs? Y Y  Comment - -  Dressing or bathing? N N  Doing errands, shopping? N N  Some recent data might be hidden    Fall/Depression Screening: Fall Risk  05/22/2018  04/23/2018 03/22/2018  Falls in the past year? 0 0 0  Number falls in past yr: - - -  Injury with Fall? - - -  Risk for fall due to : - - -  Follow up - - -   PHQ 2/9 Scores 10/10/2017 10/03/2016  PHQ - 2 Score 2 0  PHQ- 9 Score 6 -    Assessment: Patient will continue to benefit from health coach outreach for disease management and support. THN CM Care Plan Problem One     Most Recent Value  THN Long Term Goal   In 30 days the patient verbalize no admission for copd exacerbation  THN Long Term Goal Start Date  05/22/18  Interventions for Problem One Long Term Goal  Discussed prevention methods for the coronavirus,  copd signs and symptoms, action plan, medication adherenc     Plan: RN Health Coach will contact patient in the month of April and patient agrees to next outreach.   [name] RN, BSN, [place] Direct Dial:  [phone]  Fax: [phone]

## 2018-06-09 DIAGNOSIS — J449 Chronic obstructive pulmonary disease, unspecified: Secondary | ICD-10-CM | POA: Diagnosis not present

## 2018-06-09 DIAGNOSIS — R0902 Hypoxemia: Secondary | ICD-10-CM | POA: Diagnosis not present

## 2018-06-19 DIAGNOSIS — I480 Paroxysmal atrial fibrillation: Secondary | ICD-10-CM | POA: Diagnosis not present

## 2018-06-19 DIAGNOSIS — I10 Essential (primary) hypertension: Secondary | ICD-10-CM | POA: Diagnosis not present

## 2018-06-19 DIAGNOSIS — J441 Chronic obstructive pulmonary disease with (acute) exacerbation: Secondary | ICD-10-CM | POA: Diagnosis not present

## 2018-06-24 ENCOUNTER — Encounter (HOSPITAL_COMMUNITY): Payer: Self-pay | Admitting: Emergency Medicine

## 2018-06-24 ENCOUNTER — Other Ambulatory Visit: Payer: Self-pay

## 2018-06-24 ENCOUNTER — Emergency Department (HOSPITAL_COMMUNITY): Payer: Medicare Other

## 2018-06-24 ENCOUNTER — Inpatient Hospital Stay (HOSPITAL_COMMUNITY)
Admission: EM | Admit: 2018-06-24 | Discharge: 2018-06-28 | DRG: 309 | Disposition: A | Discharge status: Home Health | Payer: Medicare Other | Attending: Internal Medicine | Admitting: Internal Medicine

## 2018-06-24 DIAGNOSIS — I480 Paroxysmal atrial fibrillation: Principal | ICD-10-CM | POA: Diagnosis present

## 2018-06-24 DIAGNOSIS — I10 Essential (primary) hypertension: Secondary | ICD-10-CM | POA: Diagnosis present

## 2018-06-24 DIAGNOSIS — I351 Nonrheumatic aortic (valve) insufficiency: Secondary | ICD-10-CM | POA: Diagnosis not present

## 2018-06-24 DIAGNOSIS — Z9011 Acquired absence of right breast and nipple: Secondary | ICD-10-CM | POA: Diagnosis not present

## 2018-06-24 DIAGNOSIS — R000 Tachycardia, unspecified: Secondary | ICD-10-CM | POA: Diagnosis not present

## 2018-06-24 DIAGNOSIS — J449 Chronic obstructive pulmonary disease, unspecified: Secondary | ICD-10-CM | POA: Diagnosis present

## 2018-06-24 DIAGNOSIS — R05 Cough: Secondary | ICD-10-CM | POA: Diagnosis not present

## 2018-06-24 DIAGNOSIS — Z801 Family history of malignant neoplasm of trachea, bronchus and lung: Secondary | ICD-10-CM

## 2018-06-24 DIAGNOSIS — I4892 Unspecified atrial flutter: Secondary | ICD-10-CM | POA: Diagnosis present

## 2018-06-24 DIAGNOSIS — I361 Nonrheumatic tricuspid (valve) insufficiency: Secondary | ICD-10-CM | POA: Diagnosis not present

## 2018-06-24 DIAGNOSIS — E1169 Type 2 diabetes mellitus with other specified complication: Secondary | ICD-10-CM | POA: Diagnosis not present

## 2018-06-24 DIAGNOSIS — Z7901 Long term (current) use of anticoagulants: Secondary | ICD-10-CM

## 2018-06-24 DIAGNOSIS — Z853 Personal history of malignant neoplasm of breast: Secondary | ICD-10-CM | POA: Diagnosis not present

## 2018-06-24 DIAGNOSIS — I340 Nonrheumatic mitral (valve) insufficiency: Secondary | ICD-10-CM | POA: Diagnosis not present

## 2018-06-24 DIAGNOSIS — R059 Cough, unspecified: Secondary | ICD-10-CM

## 2018-06-24 DIAGNOSIS — E1165 Type 2 diabetes mellitus with hyperglycemia: Secondary | ICD-10-CM | POA: Diagnosis not present

## 2018-06-24 DIAGNOSIS — Z885 Allergy status to narcotic agent status: Secondary | ICD-10-CM

## 2018-06-24 DIAGNOSIS — E119 Type 2 diabetes mellitus without complications: Secondary | ICD-10-CM

## 2018-06-24 DIAGNOSIS — J9611 Chronic respiratory failure with hypoxia: Secondary | ICD-10-CM | POA: Diagnosis not present

## 2018-06-24 DIAGNOSIS — Z833 Family history of diabetes mellitus: Secondary | ICD-10-CM

## 2018-06-24 DIAGNOSIS — Z9119 Patient's noncompliance with other medical treatment and regimen: Secondary | ICD-10-CM | POA: Diagnosis not present

## 2018-06-24 DIAGNOSIS — R0602 Shortness of breath: Secondary | ICD-10-CM | POA: Diagnosis not present

## 2018-06-24 DIAGNOSIS — I4891 Unspecified atrial fibrillation: Secondary | ICD-10-CM

## 2018-06-24 DIAGNOSIS — Z87891 Personal history of nicotine dependence: Secondary | ICD-10-CM | POA: Diagnosis not present

## 2018-06-24 HISTORY — DX: Dependence on supplemental oxygen: Z99.81

## 2018-06-24 LAB — CBC
HCT: 43.4 % (ref 36.0–46.0)
Hemoglobin: 13 g/dL (ref 12.0–15.0)
MCH: 28.1 pg (ref 26.0–34.0)
MCHC: 30 g/dL (ref 30.0–36.0)
MCV: 93.7 fL (ref 80.0–100.0)
Platelets: 291 10*3/uL (ref 150–400)
RBC: 4.63 MIL/uL (ref 3.87–5.11)
RDW: 15.5 % (ref 11.5–15.5)
WBC: 15.5 10*3/uL — ABNORMAL HIGH (ref 4.0–10.5)
nRBC: 0 % (ref 0.0–0.2)

## 2018-06-24 LAB — URINALYSIS, ROUTINE W REFLEX MICROSCOPIC
Bilirubin Urine: NEGATIVE
Glucose, UA: >=500 mg/dL — AB
Hgb urine dipstick: NEGATIVE
Ketones, ur: NEGATIVE mg/dL
Leukocytes,Ua: NEGATIVE
Nitrite: NEGATIVE
Protein, ur: NEGATIVE mg/dL
Specific Gravity, Urine: 1.029 (ref 1.005–1.030)
pH: 5 (ref 5.0–8.0)

## 2018-06-24 LAB — MRSA PCR SCREENING: MRSA by PCR: NEGATIVE

## 2018-06-24 LAB — TROPONIN I: Troponin I: 0.03 ng/mL (ref <0.03)

## 2018-06-24 LAB — GLUCOSE, CAPILLARY
Glucose-Capillary: 288 mg/dL — ABNORMAL HIGH (ref 70–99)
Glucose-Capillary: 262 mg/dL — ABNORMAL HIGH (ref 70–99)

## 2018-06-24 LAB — COMPREHENSIVE METABOLIC PANEL
ALT: 20 U/L (ref 0–44)
AST: 19 U/L (ref 15–41)
Albumin: 4.3 g/dL (ref 3.5–5.0)
Alkaline Phosphatase: 65 U/L (ref 38–126)
Anion gap: 10 (ref 5–15)
BUN: 25 mg/dL — ABNORMAL HIGH (ref 8–23)
CO2: 31 mmol/L (ref 22–32)
Calcium: 9.6 mg/dL (ref 8.9–10.3)
Chloride: 96 mmol/L — ABNORMAL LOW (ref 98–111)
Creatinine, Ser: 0.78 mg/dL (ref 0.44–1.00)
GFR calc Af Amer: >60 mL/min (ref >60)
GFR calc non Af Amer: >60 mL/min (ref >60)
Glucose, Bld: 375 mg/dL — ABNORMAL HIGH (ref 70–99)
Potassium: 4.2 mmol/L (ref 3.5–5.1)
Sodium: 137 mmol/L (ref 135–145)
Total Bilirubin: 0.7 mg/dL (ref 0.3–1.2)
Total Protein: 6.7 g/dL (ref 6.5–8.1)

## 2018-06-24 LAB — TSH: TSH: 2.605 u[IU]/mL (ref 0.350–4.500)

## 2018-06-24 LAB — BRAIN NATRIURETIC PEPTIDE: B Natriuretic Peptide: 894 pg/mL — ABNORMAL HIGH (ref 0.0–100.0)

## 2018-06-24 MED ORDER — FLUTICASONE-UMECLIDIN-VILANT 100-62.5-25 MCG/INH IN AEPB: 1.0000 | INHALATION_SPRAY | Freq: Every day | RESPIRATORY_TRACT | Status: DC

## 2018-06-24 MED ORDER — ORAL CARE MOUTH RINSE
15.0000 mL | Freq: Two times a day (BID) | OROMUCOSAL | Status: DC
  Administered 2018-06-24 – 2018-06-26 (×5): 15 mL via OROMUCOSAL

## 2018-06-24 MED ORDER — METFORMIN HCL ER 500 MG PO TB24
500.0000 mg | ORAL_TABLET | Freq: Every day | ORAL | Status: DC
  Administered 2018-06-25 – 2018-06-28 (×3): 500 mg via ORAL
  Filled 2018-06-24 (×6): qty 1

## 2018-06-24 MED ORDER — DILTIAZEM HCL 50 MG/10ML IV SOLN
10.0000 mg | Freq: Once | INTRAVENOUS | Status: AC
  Administered 2018-06-24: 10 mg via INTRAVENOUS

## 2018-06-24 MED ORDER — DILTIAZEM HCL 25 MG/5ML IV SOLN
INTRAVENOUS | Status: AC
  Filled 2018-06-24: qty 5

## 2018-06-24 MED ORDER — LEVALBUTEROL HCL 0.63 MG/3ML IN NEBU
0.6300 mg | INHALATION_SOLUTION | Freq: Four times a day (QID) | RESPIRATORY_TRACT | Status: DC | PRN
  Administered 2018-06-27: 0.63 mg via RESPIRATORY_TRACT
  Filled 2018-06-24: qty 3

## 2018-06-24 MED ORDER — VITAMIN D (ERGOCALCIFEROL) 1.25 MG (50000 UNIT) PO CAPS
50000.0000 [IU] | ORAL_CAPSULE | ORAL | Status: DC
  Filled 2018-06-24: qty 1

## 2018-06-24 MED ORDER — GUAIFENESIN ER 600 MG PO TB12
600.0000 mg | ORAL_TABLET | Freq: Two times a day (BID) | ORAL | Status: DC
  Administered 2018-06-24 – 2018-06-28 (×8): 600 mg via ORAL
  Filled 2018-06-24 (×8): qty 1

## 2018-06-24 MED ORDER — DILTIAZEM HCL 100 MG IV SOLR
5.0000 mg/h | INTRAVENOUS | Status: DC
  Administered 2018-06-24: 15 mg/h via INTRAVENOUS
  Administered 2018-06-24: 5 mg/h via INTRAVENOUS
  Administered 2018-06-25: 15 mg/h via INTRAVENOUS
  Filled 2018-06-24 (×3): qty 100

## 2018-06-24 MED ORDER — EMERGEN-C IMMUNE PO PACK: 1.0000 | PACK | Freq: Every day | ORAL | Status: DC

## 2018-06-24 MED ORDER — SODIUM CHLORIDE 0.9% FLUSH
3.0000 mL | Freq: Two times a day (BID) | INTRAVENOUS | Status: DC
  Administered 2018-06-24 – 2018-06-27 (×6): 3 mL via INTRAVENOUS

## 2018-06-24 MED ORDER — ONDANSETRON HCL 4 MG PO TABS: 4.0000 mg | ORAL_TABLET | Freq: Four times a day (QID) | ORAL | Status: DC | PRN

## 2018-06-24 MED ORDER — UMECLIDINIUM BROMIDE 62.5 MCG/INH IN AEPB
1.0000 | INHALATION_SPRAY | Freq: Every day | RESPIRATORY_TRACT | Status: DC
  Administered 2018-06-25 – 2018-06-28 (×4): 1 via RESPIRATORY_TRACT
  Filled 2018-06-24: qty 7

## 2018-06-24 MED ORDER — RIVAROXABAN 20 MG PO TABS
20.0000 mg | ORAL_TABLET | Freq: Every day | ORAL | Status: DC
  Administered 2018-06-24 – 2018-06-27 (×4): 20 mg via ORAL
  Filled 2018-06-24 (×6): qty 1

## 2018-06-24 MED ORDER — INSULIN ASPART 100 UNIT/ML ~~LOC~~ SOLN
0.0000 [IU] | Freq: Three times a day (TID) | SUBCUTANEOUS | Status: DC
  Administered 2018-06-24: 11 [IU] via SUBCUTANEOUS
  Administered 2018-06-25: 7 [IU] via SUBCUTANEOUS
  Administered 2018-06-25: 4 [IU] via SUBCUTANEOUS
  Administered 2018-06-25: 11 [IU] via SUBCUTANEOUS
  Administered 2018-06-26: 4 [IU] via SUBCUTANEOUS
  Administered 2018-06-26: 7 [IU] via SUBCUTANEOUS
  Administered 2018-06-26 – 2018-06-27 (×2): 4 [IU] via SUBCUTANEOUS
  Administered 2018-06-27: 3 [IU] via SUBCUTANEOUS
  Administered 2018-06-27: 4 [IU] via SUBCUTANEOUS
  Administered 2018-06-28: 3 [IU] via SUBCUTANEOUS
  Administered 2018-06-28: 15 [IU] via SUBCUTANEOUS

## 2018-06-24 MED ORDER — VITAMIN C 500 MG PO TABS
500.0000 mg | ORAL_TABLET | Freq: Every day | ORAL | Status: DC
  Administered 2018-06-25 – 2018-06-28 (×4): 500 mg via ORAL
  Filled 2018-06-24 (×6): qty 1

## 2018-06-24 MED ORDER — FLUTICASONE FUROATE-VILANTEROL 100-25 MCG/INH IN AEPB
1.0000 | INHALATION_SPRAY | Freq: Every day | RESPIRATORY_TRACT | Status: DC
  Administered 2018-06-25 – 2018-06-28 (×4): 1 via RESPIRATORY_TRACT
  Filled 2018-06-24: qty 28

## 2018-06-24 MED ORDER — ACETAMINOPHEN 500 MG PO TABS
500.0000 mg | ORAL_TABLET | Freq: Three times a day (TID) | ORAL | Status: DC | PRN
  Administered 2018-06-24 – 2018-06-28 (×7): 500 mg via ORAL
  Filled 2018-06-24 (×7): qty 1

## 2018-06-24 MED ORDER — PREDNISONE 10 MG PO TABS
10.0000 mg | ORAL_TABLET | Freq: Every day | ORAL | Status: DC
  Administered 2018-06-25 – 2018-06-28 (×4): 10 mg via ORAL
  Filled 2018-06-24 (×4): qty 1

## 2018-06-24 MED ORDER — INSULIN ASPART 100 UNIT/ML ~~LOC~~ SOLN
0.0000 [IU] | Freq: Every day | SUBCUTANEOUS | Status: DC
  Administered 2018-06-24 – 2018-06-27 (×2): 2 [IU] via SUBCUTANEOUS

## 2018-06-24 MED ORDER — METOPROLOL TARTRATE 50 MG PO TABS: 50.0000 mg | ORAL_TABLET | Freq: Two times a day (BID) | ORAL | Status: DC

## 2018-06-24 MED ORDER — SODIUM CHLORIDE 0.9% FLUSH: 3.0000 mL | INTRAVENOUS | Status: DC | PRN

## 2018-06-24 MED ORDER — SODIUM CHLORIDE 0.9 % IV BOLUS
500.0000 mL | Freq: Once | INTRAVENOUS | Status: AC
  Administered 2018-06-24: 500 mL via INTRAVENOUS

## 2018-06-24 MED ORDER — METOPROLOL TARTRATE 50 MG PO TABS
50.0000 mg | ORAL_TABLET | Freq: Two times a day (BID) | ORAL | Status: DC
  Administered 2018-06-24 – 2018-06-28 (×8): 50 mg via ORAL
  Filled 2018-06-24 (×8): qty 1

## 2018-06-24 MED ORDER — ADULT MULTIVITAMIN W/MINERALS CH
1.0000 | ORAL_TABLET | Freq: Every day | ORAL | Status: DC
  Administered 2018-06-25 – 2018-06-28 (×4): 1 via ORAL
  Filled 2018-06-24 (×4): qty 1

## 2018-06-24 MED ORDER — ONDANSETRON HCL 4 MG/2ML IJ SOLN: 4.0000 mg | Freq: Four times a day (QID) | INTRAMUSCULAR | Status: DC | PRN

## 2018-06-24 MED ORDER — SODIUM CHLORIDE 0.9 % IV SOLN: 250.0000 mL | INTRAVENOUS | Status: DC | PRN

## 2018-06-24 NOTE — ED Triage Notes (Signed)
Pt states that she has been sob for a week

## 2018-06-24 NOTE — Progress Notes (Addendum)
Patient discussed with ER staff Dr Sedonia Small. From notes history of paroxysmal aflutter and afib, COPD, HTN, DM2. Has beeno n metropolol 50mg  bid at home and dilt 240 along with xarelto. Last EKG 01/2018 shows SR. EKG in ER shows aflutter with RVR. Given 10mg  of IV dilt with some improvement of rates but transient.   She is on xarelto 20mg  at home, if has not missed any doses x 3 weeks ok to cardiovert in ER. If succesful would monitor 1 hour after, if remains in SR then increase her home lopressor to 75mg  bid and discharge. If she missed doses of xarelto within the last 3 weeks or fails cardioversion then would start dilt gtt and admit to stepdown unit to medicine team and we would follow   We will go ahead and arrange outpatient follow up. If admitted may call us and we will follow as inpatient.     Carlyle Dolly MD

## 2018-06-24 NOTE — H&P (Signed)
History and Physical    Eileen A Joens ZOX:[id number] DOB: February 24, 1946 DOA: 06/24/2018  PCP: Lavella Lemons, PA   Patient coming from: Home  Chief Complaint: Fatigue and dyspnea  HPI: Eileen [name] is a 73 y.o. female with medical history significant for COPD with chronic hypoxemia on 2 to 3 L nasal cannula at home, type 2 diabetes, hypertension, and atrial flutter on Xarelto who presents to the ED with worsening fatigue and dyspnea on exertion over the last 4 to 5 days.  She actually did not have any symptoms such as chest pain or palpitations and was checking her heart rate at home yesterday and saw that this was quite elevated.  She checked again today and noticed the same therefore prompting the visit.  She denies any cough, fever, chills, vomiting, diarrhea, or abdominal pain.  No aggravating or alleviating factors noted.  Of note, patient has been noncompliant with her Xarelto use at home and states that she does take the medication at different times and will skip doses occasionally because the medication is expensive.   ED Course: Vital signs are stable aside from heart rate which demonstrates atrial fibrillation in the 140 to 150 bpm range.  Cardizem drip to be initiated by EDP.  EDP had spoken with cardiology who recommended cardioversion if patient was taking her home Xarelto, unfortunately this was not the case.  They recommended diltiazem drip at this time and stepdown admission and will see in consultation.  2 view chest x-ray with no acute findings.  Troponin 0.03 and white blood cell count of 15.5.  Glucose is noted to be 375.  Review of Systems: All others reviewed and otherwise negative.  Past Medical History:  Diagnosis Date  . Asthma   . Atrial flutter ([place])   . Cancer [facility])    Right breast  . COPD (chronic obstructive pulmonary disease) ([place])   . History of breast cancer    right breast  . Hypertension   . Type 2 diabetes mellitus ([place])     Past Surgical  History:  Procedure Laterality Date  . BREAST SURGERY  2011   Right breast mastectomy  . CESAREAN SECTION    . COLONOSCOPY N/A 01/22/2018   Procedure: COLONOSCOPY;  Surgeon: Rogene Houston, MD;  Location[facility];  Service: Endoscopy;  Laterality: N/A;  . HERNIA REPAIR     RIH  . MASTECTOMY  2011   right breast     reports that she quit smoking about 24 years ago. Her smoking use included cigarettes. She started smoking about 55 years ago. She has a 16.00 pack-year smoking history. She has never used smokeless tobacco. She reports that she does not drink alcohol or use drugs.  Allergies  Allergen Reactions  . Codeine Other (See Comments)    "jittery"    Family History  Problem Relation Age of Onset  . Cancer Mother        lung  . Diabetes Brother     Prior to Admission medications   Medication Sig Start Date End Date Taking? Authorizing Provider  acetaminophen (TYLENOL) 500 MG tablet Take 500 mg by mouth every 8 (eight) hours as needed for mild pain or headache.    Yes Lavella Lemons, PA  albuterol (PROVENTIL) (2.5 MG/3ML) 0.083% nebulizer solution Take 2.5 mg by nebulization every 6 (six) hours as needed for wheezing or shortness of breath.   Yes [provider]  blood glucose meter kit and supplies KIT Dispense based  on patient and insurance preference. Use up to four times daily as directed. (FOR ICD-9 250.00, 250.01).  Check sugar before meals and at night, keep a log. 09/29/16  Yes Arrien, Jimmy Picket, MD  diltiazem [facility] CD) 240 MG 24 hr capsule TAKE ONE CAPSULE BY MOUTH DAILY. 05/20/18  Yes Herminio Commons, MD  guaiFENesin (MUCINEX) 600 MG 12 hr tablet Take 1 tablet (600 mg total) by mouth 2 (two) times daily. 09/21/17  Yes Kathie Dike, MD  meclizine (ANTIVERT) 12.5 MG tablet Take 1 tablet by mouth 3 (three) times daily as needed. 03/21/18  Yes [provider]  metFORMIN (GLUCOPHAGE-XR) 500 MG 24 hr tablet Take 500 mg by mouth daily.  08/10/17  Yes [provider]  metoprolol tartrate (LOPRESSOR) 50 MG tablet Take 50 mg by mouth 2 (two) times daily. 04/12/18  Yes [provider]  Multiple Vitamins-Minerals (EMERGEN-C IMMUNE) PACK Take 1 Package by mouth daily.   Yes [provider]  predniSONE (DELTASONE) 10 MG tablet Take 10 mg by mouth daily. 04/02/18  Yes [provider]  predniSONE (DELTASONE) 20 MG tablet Take 3 PO QAM x3days, 2 PO QAM x3days, 1 PO QAM x3days Patient taking differently: Take 20-60 mg by mouth as directed. Patient takes 3 tablets by mouth once a day for 3 days, then 2 tablets by mouth once a day for 3 days, then 1 tablet by mouth once a day for 3 days, then stops 01/24/18  Yes Johnson, Clanford L, MD  PROAIR HFA 108 (90 BASE) MCG/ACT inhaler Inhale 2 puffs into the lungs every 6 (six) hours as needed for wheezing or shortness of breath.  03/06/11  Yes [provider]  psyllium (HYDROCIL/METAMUCIL) 95 % PACK Take 1 packet by mouth daily. Patient taking differently: Take 1 packet by mouth daily as needed.  01/24/18  Yes Johnson, Clanford L, MD  rivaroxaban (XARELTO) 20 MG TABS tablet Take 1 tablet (20 mg total) by mouth daily with supper. 01/27/18  Yes Johnson, Clanford L, MD  telmisartan-hydrochlorothiazide (MICARDIS HCT) 80-12.5 MG tablet Take 0.5 tablets by mouth daily as needed.  03/18/15  Yes [provider]  TRELEGY ELLIPTA 100-62.5-25 MCG/INH AEPB Take 1 puff by mouth daily. 08/27/17  Yes [provider]  Vitamin D, Ergocalciferol, (DRISDOL) 50000 units CAPS capsule Take 50,000 Units every 30 (thirty) days by mouth.   Yes [provider]  OXYGEN Inhale 2.5-3 L into the lungs daily. At night and daily as needed    [provider]    Physical Exam: Vitals:   06/24/18 1305 06/24/18 1330 06/24/18 1355 06/24/18 1400  BP:  115/67  118/73  Pulse: (!) 148 68 68 71  Resp: 18 19 (!) 24 (!) 25  Temp:      TempSrc:      SpO2: 100% 100%  100% 96%    Constitutional: NAD, calm, comfortable Vitals:   06/24/18 1305 06/24/18 1330 06/24/18 1355 06/24/18 1400  BP:  115/67  118/73  Pulse: (!) 148 68 68 71  Resp: 18 19 (!) 24 (!) 25  Temp:      TempSrc:      SpO2: 100% 100% 100% 96%   Eyes: lids and conjunctivae normal ENMT: Mucous membranes are moist.  Neck: normal, supple Respiratory: clear to auscultation bilaterally. Normal respiratory effort. No accessory muscle use.  Currently on 3 L nasal cannula. Cardiovascular: Irregular and tachycardic, no murmurs. No extremity edema. Abdomen: no tenderness, no distention. Bowel sounds positive.  Musculoskeletal:  No  joint deformity upper and lower extremities.   Skin: no rashes, lesions, ulcers.  Psychiatric: Normal judgment and insight. Alert and oriented x 3. Normal mood.   Labs on Admission: I have personally reviewed following labs and imaging studies  CBC: Recent Labs  Lab 06/24/18 1302  WBC 15.5*  HGB 13.0  HCT 43.4  MCV 93.7  PLT 017   Basic Metabolic Panel: Recent Labs  Lab 06/24/18 1302  NA 137  K 4.2  CL 96*  CO2 31  GLUCOSE 375*  BUN 25*  CREATININE 0.78  CALCIUM 9.6   GFR: CrCl cannot be calculated (Unknown ideal weight.). Liver Function Tests: Recent Labs  Lab 06/24/18 1302  AST 19  ALT 20  ALKPHOS 65  BILITOT 0.7  PROT 6.7  ALBUMIN 4.3   No results for input(s): LIPASE, AMYLASE in the last 168 hours. No results for input(s): AMMONIA in the last 168 hours. Coagulation Profile: No results for input(s): INR, PROTIME in the last 168 hours. Cardiac Enzymes: Recent Labs  Lab 06/24/18 1302  TROPONINI 0.03*   BNP (last 3 results) No results for input(s): PROBNP in the last 8760 hours. HbA1C: No results for input(s): HGBA1C in the last 72 hours. CBG: No results for input(s): GLUCAP in the last 168 hours. Lipid Profile: No results for input(s): CHOL, HDL, LDLCALC, TRIG, CHOLHDL, LDLDIRECT in the last 72 hours. Thyroid Function  Tests: No results for input(s): TSH, T4TOTAL, FREET4, T3FREE, THYROIDAB in the last 72 hours. Anemia Panel: No results for input(s): VITAMINB12, FOLATE, FERRITIN, TIBC, IRON, RETICCTPCT in the last 72 hours. Urine analysis:    Component Value Date/Time   COLORURINE YELLOW 09/09/2009 1010   APPEARANCEUR HAZY (A) 09/09/2009 1010   LABSPEC 1.030 09/09/2009 1010   PHURINE 5.5 09/09/2009 1010   GLUCOSEU NEGATIVE 09/09/2009 1010   HGBUR NEGATIVE 09/09/2009 1010   BILIRUBINUR NEGATIVE 09/09/2009 1010   KETONESUR NEGATIVE 09/09/2009 1010   PROTEINUR NEGATIVE 09/09/2009 1010   UROBILINOGEN 0.2 09/09/2009 1010   NITRITE NEGATIVE 09/09/2009 1010   LEUKOCYTESUR  09/09/2009 1010    NEGATIVE MICROSCOPIC NOT DONE ON URINES WITH NEGATIVE PROTEIN, BLOOD, LEUKOCYTES, NITRITE, OR GLUCOSE <1000 mg/dL.    Radiological Exams on Admission: Dg Chest 2 View  Result Date: 06/24/2018 CLINICAL DATA:  Cough and shortness of breath EXAM: CHEST - 2 VIEW COMPARISON:  None. FINDINGS: The heart size and mediastinal contours are within normal limits. Left basilar atelectasis. The visualized skeletal structures are unremarkable. IMPRESSION: No active cardiopulmonary disease. Electronically Signed   By: Ulyses Jarred M.D.   On: 06/24/2018 14:02    EKG: Independently reviewed. Aflutter 129bpm.  Assessment/Plan Principal Problem:   Atrial fibrillation with RVR (HCC) Active Problems:   COPD (chronic obstructive pulmonary disease) (HCC)   Essential hypertension   Diabetes mellitus ([place])    1. Atrial fibrillation/flutter with RVR.  Will maintain on Cardizem drip for rate control and monitor carefully in stepdown unit.  Xarelto for stroke prophylaxis.  Appreciate cardiology evaluation in a.m.  Will check TSH.  Echo per cardiology.  Continue on beta-blocker. 2. COPD with chronic hypoxemia on home 3 L.  Will order duo nebs as needed for shortness of breath or wheezing.  Currently without any active bronchospasms noted.  3. Essential hypertension.  Continue home medications and monitor carefully while on diltiazem drip. 4. Diabetes mellitus with hyperglycemia.  Placed on carb modified diet with SSI along with home metformin.   DVT prophylaxis: Xarelto Code Status: Full Family Communication: None at bedside  Disposition Plan:Admit for Afib evaluation/workup Consults called:Cardiology Admission status: Inpatient, SDU   Smiley Birr Darleen Crocker DO [facility] Pager [phone]  If 7PM-7AM, please contact night-coverage [email or web address] Password [facility]  06/24/2018, 2:58 PM

## 2018-06-24 NOTE — ED Provider Notes (Signed)
[facility] Emergency Department Provider Note MRN:  [id number]  Arrival date & time: 06/24/18     Chief Complaint   Fatigue History of Present Illness   Eileen [name] is a 73 y.o. year-old female with a history of atrial flutter, COPD presenting to the ED with chief complaint of fatigue.  Patient endorsing 4 to 5 days of increased fatigue, dyspnea on exertion, low energy.  Explains that she was checking her pulse and pulse ox at home yesterday and saw that her heart rate was elevated.  Checked again today, continue to be elevated.  Denies fever, no chest pain or shortness of breath, no vomiting or diarrhea, no abdominal pain.  Symptoms are constant, no exacerbating relieving factors.  Review of Systems  A complete 10 system review of systems was obtained and all systems are negative except as noted in the HPI and PMH.   Patient's Health History    Past Medical History:  Diagnosis Date  . Asthma   . Atrial flutter ([place])   . Cancer [facility])    Right breast  . COPD (chronic obstructive pulmonary disease) ([place])   . History of breast cancer    right breast  . Hypertension   . Type 2 diabetes mellitus ([place])     Past Surgical History:  Procedure Laterality Date  . BREAST SURGERY  2011   Right breast mastectomy  . CESAREAN SECTION    . COLONOSCOPY N/A 01/22/2018   Procedure: COLONOSCOPY;  Surgeon: Rogene Houston, MD;  Location: [facility];  Service: Endoscopy;  Laterality: N/A;  . HERNIA REPAIR     RIH  . MASTECTOMY  2011   right breast    Family History  Problem Relation Age of Onset  . Cancer Mother        lung  . Diabetes Brother     Social History   Socioeconomic History  . Marital status: Single    Spouse name: Not on file  . Number of children: Not on file  . Years of education: Not on file  . Highest education level: Not on file  Occupational History  . Not on file  Social Needs  . Financial resource strain: Not on file  . Food  insecurity:    Worry: Not on file    Inability: Not on file  . Transportation needs:    Medical: Not on file    Non-medical: Not on file  Tobacco Use  . Smoking status: Former Smoker    Packs/day: 0.50    Years: 32.00    Pack years: 16.00    Types: Cigarettes    Start date: 12/12/1962    Last attempt to quit: 03/13/1994    Years since quitting: 24.2  . Smokeless tobacco: Never Used  Substance and Sexual Activity  . Alcohol use: No    Alcohol/week: 0.0 standard drinks  . Drug use: No  . Sexual activity: Not on file  Lifestyle  . Physical activity:    Days per week: Not on file    Minutes per session: Not on file  . Stress: Not on file  Relationships  . Social connections:    Talks on phone: Not on file    Gets together: Not on file    Attends religious service: Not on file    Active member of club or organization: Not on file    Attends meetings of clubs or organizations: Not on file    Relationship status: Not on file  .  Intimate partner violence:    Fear of current or ex partner: Not on file    Emotionally abused: Not on file    Physically abused: Not on file    Forced sexual activity: Not on file  Other Topics Concern  . Not on file  Social History Narrative  . Not on file     Physical Exam  Vital Signs and Nursing Notes reviewed Vitals:   06/24/18 1355 06/24/18 1400  BP:  118/73  Pulse: 68 71  Resp: (!) 24 (!) 25  Temp:    SpO2: 100% 96%    CONSTITUTIONAL: Well-appearing, NAD NEURO:  Alert and oriented x 3, no focal deficits EYES:  eyes equal and reactive ENT/NECK:  no LAD, no JVD CARDIO: Tachycardic rate, well-perfused, normal S1 and S2 PULM:  CTAB no wheezing or rhonchi GI/GU:  normal bowel sounds, non-distended, non-tender MSK/SPINE:  No gross deformities, no edema SKIN:  no rash, atraumatic PSYCH:  Appropriate speech and behavior  Diagnostic and Interventional Summary    EKG Interpretation  Date/Time:  Monday June 24 2018 12:55:12 EDT  Ventricular Rate:  146 PR Interval:    QRS Duration: 80 QT Interval:  333 QTC Calculation: 519 R Axis:   34 Text Interpretation:  Sinus tachycardia Repolarization abnormality, prob rate related Minimal ST elevation, lateral leads Prolonged QT interval Baseline wander in lead(s) V2 V3 Confirmed by Gerlene Fee [phone]) on 06/24/2018 1:23:49 PM      Labs Reviewed  CBC - Abnormal; Notable for the following components:      Result Value   WBC 15.5 (*)    All other components within normal limits  COMPREHENSIVE METABOLIC PANEL - Abnormal; Notable for the following components:   Chloride 96 (*)    Glucose, Bld 375 (*)    BUN 25 (*)    All other components within normal limits  BRAIN NATRIURETIC PEPTIDE - Abnormal; Notable for the following components:   B Natriuretic Peptide 894.0 (*)    All other components within normal limits  TROPONIN I - Abnormal; Notable for the following components:   Troponin I 0.03 (*)    All other components within normal limits  URINALYSIS, ROUTINE W REFLEX MICROSCOPIC  TSH  HEMOGLOBIN A1C    DG Chest 2 View  Final Result      Medications  diltiazem (CARDIZEM) 25 MG/5ML injection (has no administration in time range)  diltiazem (CARDIZEM) 100 mg in dextrose 5 % 100 mL (1 mg/mL) infusion (5 mg/hr Intravenous New Bag/Given 06/24/18 1457)  acetaminophen (TYLENOL) tablet 500 mg (has no administration in time range)  metFORMIN (GLUCOPHAGE-XR) 24 hr tablet 500 mg (has no administration in time range)  predniSONE (DELTASONE) tablet 10 mg (has no administration in time range)  rivaroxaban (XARELTO) tablet 20 mg (has no administration in time range)  Emergen-C Immune PACK 1 Package (has no administration in time range)  Vitamin D (Ergocalciferol) (DRISDOL) capsule 50,000 Units (has no administration in time range)  guaiFENesin (MUCINEX) 12 hr tablet 600 mg (has no administration in time range)  Fluticasone-Umeclidin-Vilant 100-62.5-25 MCG/INH AEPB 1 puff (has  no administration in time range)  sodium chloride flush (NS) 0.9 % injection 3 mL (has no administration in time range)  sodium chloride flush (NS) 0.9 % injection 3 mL (has no administration in time range)  0.9 %  sodium chloride infusion (has no administration in time range)  ondansetron (ZOFRAN) tablet 4 mg (has no administration in time range)    Or  ondansetron (ZOFRAN)  injection 4 mg (has no administration in time range)  insulin aspart (novoLOG) injection 0-20 Units (has no administration in time range)  insulin aspart (novoLOG) injection 0-5 Units (has no administration in time range)  levalbuterol (XOPENEX) nebulizer solution 0.63 mg (has no administration in time range)  metoprolol tartrate (LOPRESSOR) tablet 50 mg (has no administration in time range)  diltiazem (CARDIZEM) injection SOLN 10 mg (10 mg Intravenous Given 06/24/18 1322)  sodium chloride 0.9 % bolus 500 mL (0 mLs Intravenous Stopped 06/24/18 1355)     Procedures Critical Care Critical Care Documentation Critical care time provided by me (excluding procedures): 33 minutes  Condition necessitating critical care: Atrial flutter with RVR  Components of critical care management: reviewing of prior records, laboratory and imaging interpretation, frequent re-examination and reassessment of vital signs, administration of IV diltiazem, discussion with consulting services    ED Course and Medical Decision Making  I have reviewed the triage vital signs and the nursing notes.  Pertinent labs & imaging results that were available during my care of the patient were reviewed by me and considered in my medical decision making (see below for details).  A flutter with RVR in this 73 year old female with history of the same.  Unclear when the RVR began, most likely 4 to 5 days ago when symptoms began.  Therefore not an ideal cardioversion candidate.  Although, patient is already anticoagulated.  Will attempt rate control and consider  admission if unable to properly control the rate.  Clinical Course as of Jun 24 1506  Mon Jun 24, 2018  1426 Discussed case with Dr. Harl Bowie of cardiology, with the main question of whether or not patient is a cardioversion candidate given that she is already anticoagulated.  Patient would have been a candidate for cardioversion if she was confident she had not missed any of her AC dosing for the past 3 weeks.  To the contrary, patient is confident that she has missed at least 1 dose over this timeframe.  Therefore, will admit to stepdown unit with diltiazem drip per cardiology recommendations.   [MB]    Clinical Course User Index [MB] Sedonia Small Barth Kirks, MD     Barth Kirks. Sedonia Small, Stacy [email or web address]  Final Clinical Impressions(s) / ED Diagnoses     ICD-10-CM   1. Atrial flutter with rapid ventricular response (HCC) I48.92   2. Cough R05 DG Chest 2 View    DG Chest 2 View    ED Discharge Orders    None         Maudie Flakes, MD 06/24/18 [phone]

## 2018-06-24 NOTE — ED Notes (Signed)
Date and time results received: 06/24/18 1348 (use smartphrase ".now" to insert current time)  Test: Troponin Critical Value: 0.03  Name of Provider Notified:Dr Bero Orders Received? Or Actions Taken?: NA

## 2018-06-24 NOTE — ED Notes (Signed)
ED TO INPATIENT HANDOFF REPORT  ED Nurse Name and Phone #: Rukiya Hodgkins [phone]  S Name/Age/Gender Eileen [name] 73 y.o. female Room/Bed: APA08/APA08  Code Status   Code Status: Full Code  Home/SNF/Other Home Patient oriented to: self, place, time and situation Is this baseline? Yes   Triage Complete: Triage complete  Chief Complaint Shortness of breath  Triage Note Pt states that she has been sob for a week    Allergies Allergies  Allergen Reactions  . Codeine Other (See Comments)    "jittery"    Level of Care/Admitting Diagnosis ED Disposition    ED Disposition Condition [place]: [facility] [[id number]]  Level of Care: Stepdown [14]  Diagnosis: Atrial fibrillation with RVR [facility]) [[id number]]  Admitting Physician: Rodena Goldmann [[id number]]  Attending Physician: Rodena Goldmann [[id number]]  Estimated length of stay: past midnight tomorrow  Certification:: I certify this patient will need inpatient services for at least 2 midnights  PT Class (Do Not Modify): Inpatient [101]  PT Acc Code (Do Not Modify): Private [1]       B Medical/Surgery History Past Medical History:  Diagnosis Date  . Asthma   . Atrial flutter ([place])   . Cancer [facility])    Right breast  . COPD (chronic obstructive pulmonary disease) ([place])   . History of breast cancer    right breast  . Hypertension   . Type 2 diabetes mellitus ([place])    Past Surgical History:  Procedure Laterality Date  . BREAST SURGERY  2011   Right breast mastectomy  . CESAREAN SECTION    . COLONOSCOPY N/A 01/22/2018   Procedure: COLONOSCOPY;  Surgeon: Rogene Houston, MD;  Location: [facility] ENDO SUITE;  Service: Endoscopy;  Laterality: N/A;  . HERNIA REPAIR     RIH  . MASTECTOMY  2011   right breast     A IV Location/Drains/Wounds Patient Lines/Drains/Airways Status   Active Line/Drains/Airways    Name:   Placement date:   Placement time:   Site:   Days:   Peripheral IV 06/24/18 Left  Antecubital   06/24/18    1305    Antecubital   less than 1          Intake/Output Last 24 hours No intake or output data in the 24 hours ending 06/24/18 1611  Labs/Imaging Results for orders placed or performed during the hospital encounter of 06/24/18 (from the past 48 hour(s))  CBC     Status: Abnormal   Collection Time: 06/24/18  1:02 PM  Result Value Ref Range   WBC 15.5 (H) 4.0 - 10.5 K/uL   RBC 4.63 3.87 - 5.11 MIL/uL   Hemoglobin 13.0 12.0 - 15.0 g/dL   HCT 43.4 36.0 - 46.0 %   MCV 93.7 80.0 - 100.0 fL   MCH 28.1 26.0 - 34.0 pg   MCHC 30.0 30.0 - 36.0 g/dL   RDW 15.5 11.5 - 15.5 %   Platelets 291 150 - 400 K/uL   nRBC 0.0 0.0 - 0.2 %    Comment: Performed at [facility], [address], [place], [place] [address]  Comprehensive metabolic panel     Status: Abnormal   Collection Time: 06/24/18  1:02 PM  Result Value Ref Range   Sodium 137 135 - 145 mmol/L   Potassium 4.2 3.5 - 5.1 mmol/L   Chloride 96 (L) 98 - 111 mmol/L   CO2 31 22 - 32 mmol/L   Glucose, Bld 375 (  H) 70 - 99 mg/dL   BUN 25 (H) 8 - 23 mg/dL   Creatinine, Ser 0.78 0.44 - 1.00 mg/dL   Calcium 9.6 8.9 - 10.3 mg/dL   Total Protein 6.7 6.5 - 8.1 g/dL   Albumin 4.3 3.5 - 5.0 g/dL   AST 19 15 - 41 U/L   ALT 20 0 - 44 U/L   Alkaline Phosphatase 65 38 - 126 U/L   Total Bilirubin 0.7 0.3 - 1.2 mg/dL   GFR calc non Af Amer >60 >60 mL/min   GFR calc Af Amer >60 >60 mL/min   Anion gap 10 5 - 15    Comment: Performed at [facility], [address] [place], [place] [address]  Brain natriuretic peptide     Status: Abnormal   Collection Time: 06/24/18  1:02 PM  Result Value Ref Range   B Natriuretic Peptide 894.0 (H) 0.0 - 100.0 pg/mL    Comment: Performed at [facility], [address], [place], [place] [address]  Troponin I - ONCE - STAT     Status: Abnormal   Collection Time: 06/24/18  1:02 PM  Result Value Ref Range   Troponin I 0.03 (HH) <0.03 ng/mL    Comment: CRITICAL RESULT CALLED TO, READ BACK BY  AND VERIFIED WITH: CRAWFORD,H AT 1347 ON 4.13.20 BY ISLEY,B Performed at [facility], [address], [place], [place] [address]   TSH     Status: None   Collection Time: 06/24/18  1:02 PM  Result Value Ref Range   TSH 2.605 0.350 - 4.500 uIU/mL    Comment: Performed by a 3rd Generation assay with a functional sensitivity of <=0.01 uIU/mL. Performed at [facility], [address], [place], [place] [address]    Dg Chest 2 View  Result Date: 06/24/2018 CLINICAL DATA:  Cough and shortness of breath EXAM: CHEST - 2 VIEW COMPARISON:  None. FINDINGS: The heart size and mediastinal contours are within normal limits. Left basilar atelectasis. The visualized skeletal structures are unremarkable. IMPRESSION: No active cardiopulmonary disease. Electronically Signed   By: Ulyses Jarred M.D.   On: 06/24/2018 14:02    Pending Labs Unresulted Labs (From admission, onward)    Start     Ordered   06/25/18 0500  Magnesium  Tomorrow morning,   R     06/24/18 1504   06/25/18 7121  Basic metabolic panel  Tomorrow morning,   R     06/24/18 1504   06/25/18 0500  CBC  Tomorrow morning,   R     06/24/18 1504   06/24/18 1504  Hemoglobin A1c  Once,   R    Comments:  To assess prior glycemic control    06/24/18 1504   06/24/18 1302  Urinalysis, Routine w reflex microscopic  ONCE - STAT,   R     06/24/18 1302          Vitals/Pain Today's Vitals   06/24/18 1515 06/24/18 1517 06/24/18 1530 06/24/18 1600  BP:   110/85 116/85  Pulse: (!) 145 (!) 145 (!) 147 (!) 145  Resp: 20 (!) 24 19 (!) 27  Temp:      TempSrc:      SpO2: 100% 100% 96% 99%  PainSc:        Isolation Precautions No active isolations  Medications Medications  diltiazem (CARDIZEM) 25 MG/5ML injection (has no administration in time range)  diltiazem (CARDIZEM) 100 mg in dextrose 5 % 100 mL (1 mg/mL) infusion (15 mg/hr Intravenous Rate/Dose  Change 06/24/18 1607)  acetaminophen (TYLENOL) tablet 500 mg (has no administration in time  range)  metFORMIN (GLUCOPHAGE-XR) 24 hr tablet 500 mg (has no administration in time range)  predniSONE (DELTASONE) tablet 10 mg (has no administration in time range)  rivaroxaban (XARELTO) tablet 20 mg (has no administration in time range)  Vitamin D (Ergocalciferol) (DRISDOL) capsule 50,000 Units (has no administration in time range)  guaiFENesin (MUCINEX) 12 hr tablet 600 mg (has no administration in time range)  sodium chloride flush (NS) 0.9 % injection 3 mL (has no administration in time range)  sodium chloride flush (NS) 0.9 % injection 3 mL (has no administration in time range)  0.9 %  sodium chloride infusion (has no administration in time range)  ondansetron (ZOFRAN) tablet 4 mg (has no administration in time range)    Or  ondansetron (ZOFRAN) injection 4 mg (has no administration in time range)  insulin aspart (novoLOG) injection 0-20 Units (has no administration in time range)  insulin aspart (novoLOG) injection 0-5 Units (has no administration in time range)  levalbuterol (XOPENEX) nebulizer solution 0.63 mg (has no administration in time range)  metoprolol tartrate (LOPRESSOR) tablet 50 mg (has no administration in time range)  vitamin C (ASCORBIC ACID) tablet 500 mg (has no administration in time range)    And  multivitamin with minerals tablet 1 tablet (has no administration in time range)  fluticasone furoate-vilanterol (BREO ELLIPTA) 100-25 MCG/INH 1 puff (has no administration in time range)    And  umeclidinium bromide (INCRUSE ELLIPTA) 62.5 MCG/INH 1 puff (has no administration in time range)  diltiazem (CARDIZEM) injection SOLN 10 mg (10 mg Intravenous Given 06/24/18 1322)  sodium chloride 0.9 % bolus 500 mL (0 mLs Intravenous Stopped 06/24/18 1355)    Mobility walks Low fall risk   Focused Assessments     Recommendations: See Admitting Provider Note  Report given to: Sarah,rh  Additional Notes:

## 2018-06-25 ENCOUNTER — Other Ambulatory Visit: Payer: Self-pay

## 2018-06-25 ENCOUNTER — Ambulatory Visit: Payer: Self-pay

## 2018-06-25 DIAGNOSIS — I4892 Unspecified atrial flutter: Secondary | ICD-10-CM

## 2018-06-25 LAB — CBC
HCT: 39.4 % (ref 36.0–46.0)
Hemoglobin: 11.9 g/dL — ABNORMAL LOW (ref 12.0–15.0)
MCH: 28.5 pg (ref 26.0–34.0)
MCHC: 30.2 g/dL (ref 30.0–36.0)
MCV: 94.5 fL (ref 80.0–100.0)
Platelets: 253 10*3/uL (ref 150–400)
RBC: 4.17 MIL/uL (ref 3.87–5.11)
RDW: 15.3 % (ref 11.5–15.5)
WBC: 12.1 10*3/uL — ABNORMAL HIGH (ref 4.0–10.5)
nRBC: 0 % (ref 0.0–0.2)

## 2018-06-25 LAB — BASIC METABOLIC PANEL
Anion gap: 8 (ref 5–15)
BUN: 20 mg/dL (ref 8–23)
CO2: 34 mmol/L — ABNORMAL HIGH (ref 22–32)
Calcium: 9.4 mg/dL (ref 8.9–10.3)
Chloride: 98 mmol/L (ref 98–111)
Creatinine, Ser: 0.51 mg/dL (ref 0.44–1.00)
GFR calc Af Amer: >60 mL/min (ref >60)
GFR calc non Af Amer: >60 mL/min (ref >60)
Glucose, Bld: 178 mg/dL — ABNORMAL HIGH (ref 70–99)
Potassium: 3.9 mmol/L (ref 3.5–5.1)
Sodium: 140 mmol/L (ref 135–145)

## 2018-06-25 LAB — GLUCOSE, CAPILLARY
Glucose-Capillary: 167 mg/dL — ABNORMAL HIGH (ref 70–99)
Glucose-Capillary: 245 mg/dL — ABNORMAL HIGH (ref 70–99)
Glucose-Capillary: 300 mg/dL — ABNORMAL HIGH (ref 70–99)
Glucose-Capillary: 195 mg/dL — ABNORMAL HIGH (ref 70–99)

## 2018-06-25 LAB — HEMOGLOBIN A1C
Hgb A1c MFr Bld: 8.6 % — ABNORMAL HIGH (ref 4.8–5.6)
Mean Plasma Glucose: 200 mg/dL

## 2018-06-25 LAB — MAGNESIUM: Magnesium: 2 mg/dL (ref 1.7–2.4)

## 2018-06-25 MED ORDER — AMIODARONE HCL IN DEXTROSE 360-4.14 MG/200ML-% IV SOLN
60.0000 mg/h | INTRAVENOUS | Status: AC
  Administered 2018-06-25 (×2): 60 mg/h via INTRAVENOUS
  Filled 2018-06-25 (×2): qty 200

## 2018-06-25 MED ORDER — AMIODARONE HCL IN DEXTROSE 360-4.14 MG/200ML-% IV SOLN
30.0000 mg/h | INTRAVENOUS | Status: DC
  Administered 2018-06-25 – 2018-06-27 (×4): 30 mg/h via INTRAVENOUS
  Filled 2018-06-25 (×5): qty 200

## 2018-06-25 MED ORDER — AMIODARONE LOAD VIA INFUSION
150.0000 mg | Freq: Once | INTRAVENOUS | Status: AC
  Administered 2018-06-25: 150 mg via INTRAVENOUS
  Filled 2018-06-25: qty 83.34

## 2018-06-25 NOTE — Consult Note (Signed)
Cardiology Consultation:   Patient ID: Eileen [name] MRN: [id number]; DOB: 04/20/1945  Admit date: 06/24/2018 Date of Consult: 06/25/2018  Primary Care Provider: Lavella Lemons, PA Primary Cardiologist: Kate Sable, MD  Primary Electrophysiologist:  None    Patient Profile:   Eileen [name] is a 73 y.o. female with a hx of paroxysmal aflutter and afib who is being seen today for the evaluation of aflutter with RVR at the request of Dr Manuella Ghazi.  History of Present Illness:   Eileen [name] 73 yo female history of paroxysmal aflutter and afib, COPD, HTN, DM2. Has been on metropolol 50mg  bid at home and dilt 240 along with xarelto. Presents with fatigue, found to be in aflutter with RVR in ER. She had missed a dose of anticoag within the last 3 weeks and thus was not able to be cardioverted in the ER, started on dilt gtt.   She denies any significant palpitaitons or SOB, just has had fatigue for last several days   WBC 15.5 Plt 291 K 4.2 Cr 0.78 BUN 25 BNP 894 TSH 2.6 Mg 2 Trop 0.03--> CXR no acute process EKG aflutter with RVR Past Medical History:  Diagnosis Date  . Asthma   . Atrial flutter ([place])   . Cancer [facility])    Right breast  . COPD (chronic obstructive pulmonary disease) ([place])   . History of breast cancer    right breast  . Hypertension   . On home O2   . Type 2 diabetes mellitus ([place])     Past Surgical History:  Procedure Laterality Date  . BREAST SURGERY  2011   Right breast mastectomy  . CESAREAN SECTION    . COLONOSCOPY N/A 01/22/2018   Procedure: COLONOSCOPY;  Surgeon: Rogene Houston, MD;  Location: [facility] ENDO SUITE;  Service: Endoscopy;  Laterality: N/A;  . HERNIA REPAIR     RIH  . MASTECTOMY  2011   right breast      Inpatient Medications: Scheduled Meds: . fluticasone furoate-vilanterol  1 puff Inhalation Daily   And  . umeclidinium bromide  1 puff Inhalation Daily  . guaiFENesin  600 mg Oral BID  . insulin aspart  0-20 Units Subcutaneous  TID WC  . insulin aspart  0-5 Units Subcutaneous QHS  . mouth rinse  15 mL Mouth Rinse BID  . metFORMIN  500 mg Oral Q breakfast  . metoprolol tartrate  50 mg Oral BID  . vitamin C  500 mg Oral Daily   And  . multivitamin with minerals  1 tablet Oral Daily  . predniSONE  10 mg Oral Daily  . rivaroxaban  20 mg Oral Q supper  . sodium chloride flush  3 mL Intravenous Q12H  . [START ON 06/26/2018] Vitamin D (Ergocalciferol)  50,000 Units Oral Q30 days   Continuous Infusions: . sodium chloride    . diltiazem (CARDIZEM) infusion 15 mg/hr (06/25/18 0626)   PRN Meds: sodium chloride, acetaminophen, levalbuterol, ondansetron **OR** ondansetron (ZOFRAN) IV, sodium chloride flush  Allergies:    Allergies  Allergen Reactions  . Codeine Other (See Comments)    "jittery"    Social History:   Social History   Socioeconomic History  . Marital status: Single    Spouse name: Not on file  . Number of children: Not on file  . Years of education: Not on file  . Highest education level: Not on file  Occupational History  . Not on file  Social Needs  . Financial resource strain:  Not hard at all  . Food insecurity:    Worry: Never true    Inability: Never true  . Transportation needs:    Medical: Patient refused    Non-medical: Patient refused  Tobacco Use  . Smoking status: Former Smoker    Packs/day: 0.50    Years: 32.00    Pack years: 16.00    Types: Cigarettes    Start date: 12/12/1962    Last attempt to quit: 03/13/1994    Years since quitting: 24.3  . Smokeless tobacco: Never Used  Substance and Sexual Activity  . Alcohol use: No    Alcohol/week: 0.0 standard drinks  . Drug use: No  . Sexual activity: Not on file  Lifestyle  . Physical activity:    Days per week: Patient refused    Minutes per session: Patient refused  . Stress: Not at all  Relationships  . Social connections:    Talks on phone: Three times a week    Gets together: Three times a week    Attends  religious service: More than 4 times per year    Active member of club or organization: Yes    Attends meetings of clubs or organizations: More than 4 times per year    Relationship status: Never married  . Intimate partner violence:    Fear of current or ex partner: No    Emotionally abused: No    Physically abused: No    Forced sexual activity: No  Other Topics Concern  . Not on file  Social History Narrative  . Not on file    Family History:    Family History  Problem Relation Age of Onset  . Cancer Mother        lung  . Diabetes Brother      ROS:  Please see the history of present illness. All other ROS reviewed and negative.     Physical Exam/Data:   Vitals:   06/25/18 0600 06/25/18 0615 06/25/18 0700 06/25/18 0800  BP: 96/68 (!) 89/50 104/65 (!) 124/97  Pulse: 86 72 91 (!) 158  Resp: 19 (!) 23 (!) 22 (!) 21  Temp:      TempSrc:      SpO2: 100% 99% 97% 93%  Weight:      Height:        Intake/Output Summary (Last 24 hours) at 06/25/2018 0914 Last data filed at 06/25/2018 0800 Gross per 24 hour  Intake 234.93 ml  Output 600 ml  Net -365.07 ml   Last 3 Weights 06/25/2018 06/24/2018 01/20/2018  Weight (lbs) 178 lb 5.6 oz 175 lb 0.7 oz 175 lb 7.8 oz  Weight (kg) 80.9 kg 79.4 kg 79.6 kg     Body mass index is 32.62 kg/m.  General:  Well nourished, well developed, in no acute distress HEENT: normal Lymph: no adenopathy Neck: no JVD Endocrine:  No thryomegaly Cardiac:  Regular,tachy Lungs:  clear to auscultation bilaterally, no wheezing, rhonchi or rales  Abd: soft, nontender, no hepatomegaly  Ext: no edema Musculoskeletal:  No deformities, BUE and BLE strength normal and equal Skin: warm and dry  Neuro:  CNs 2-12 intact, no focal abnormalities noted Psych:  Normal affect     Laboratory Data:  Chemistry Recent Labs  Lab 06/24/18 1302 06/25/18 0417  NA 137 140  K 4.2 3.9  CL 96* 98  CO2 31 34*  GLUCOSE 375* 178*  BUN 25* 20  CREATININE 0.78  0.51  CALCIUM 9.6 9.4  GFRNONAA >60 >  60  GFRAA >60 >60  ANIONGAP 10 8    Recent Labs  Lab 06/24/18 1302  PROT 6.7  ALBUMIN 4.3  AST 19  ALT 20  ALKPHOS 65  BILITOT 0.7   Hematology Recent Labs  Lab 06/24/18 1302 06/25/18 0417  WBC 15.5* 12.1*  RBC 4.63 4.17  HGB 13.0 11.9*  HCT 43.4 39.4  MCV 93.7 94.5  MCH 28.1 28.5  MCHC 30.0 30.2  RDW 15.5 15.3  PLT 291 253   Cardiac Enzymes Recent Labs  Lab 06/24/18 1302  TROPONINI 0.03*   No results for input(s): TROPIPOC in the last 168 hours.  BNP Recent Labs  Lab 06/24/18 1302  BNP 894.0*    DDimer No results for input(s): DDIMER in the last 168 hours.  Radiology/Studies:  Dg Chest 2 View  Result Date: 06/24/2018 CLINICAL DATA:  Cough and shortness of breath EXAM: CHEST - 2 VIEW COMPARISON:  None. FINDINGS: The heart size and mediastinal contours are within normal limits. Left basilar atelectasis. The visualized skeletal structures are unremarkable. IMPRESSION: No active cardiopulmonary disease. Electronically Signed   By: Ulyses Jarred M.D.   On: 06/24/2018 14:02    Assessment and Plan:   1. Aflutter with RVR - had missed a dose of xarelto within last 3 weeks, thus could not cardiovert in ER - Has been on metropolol 50mg  bid at home and dilt 240 along with xarelto - on cardizem gtt for rate control, remains elevated. REmains on lopressor 50mg  bid and xarelto - rates remain elevated in 150s on 15 of cardizem - will start amiodarone gtt today with hopes it will convert her, if not would have to consider TEE/DCCV. In setting of COVID-19 risks would try best to avoid TEE if possible but may have to consider. Make npo tonight.  - mildly elevated BNP, CXR is clear without overt signs of failure on exam, breathing at baseline. Likely some mild failure related to her sustained tachycardia, follow with better arrhythmia contorl     For questions or updates, please contact [place] Please consult [email or web address] for  contact info under     Signed, Carlyle Dolly, MD  06/25/2018 9:14 AM

## 2018-06-25 NOTE — Progress Notes (Signed)
Inpatient Diabetes Program Recommendations  AACE/ADA: New Consensus Statement on Inpatient Glycemic Control  Target Ranges:  Prepandial:   less than 140 mg/dL      Peak postprandial:   less than 180 mg/dL (1-2 hours)      Critically ill patients:  140 - 180 mg/dL  Results for [name], Eileen A (MRN [id number]) as of 06/25/2018 07:07  Ref. Range 06/25/2018 04:17  Glucose Latest Ref Range: 70 - 99 mg/dL 178 (H)   Results for [name], Eileen A (MRN [id number]) as of 06/25/2018 07:07  Ref. Range 06/24/2018 16:37 06/24/2018 21:18  Glucose-Capillary Latest Ref Range: 70 - 99 mg/dL 288 (H) 262 (H)   Results for [name], Eileen A (MRN [id number]) as of 06/25/2018 07:07  Ref. Range 08/31/2017 19:44 06/24/2018 13:02  Hemoglobin A1C Latest Ref Range: 4.8 - 5.6 % 6.9 (H) 8.6 (H)    Review of Glycemic Control  Diabetes history: DM2 Outpatient Diabetes medications: Metformin XR 500 mg BID Current orders for Inpatient glycemic control: Novolog 0-20 units TID with meals, Novolog 0-5 units QHS, Metformin XR 50 mg BID; Prednisone 10 mg daily  Inpatient Diabetes Program Recommendations:   Insulin - Meal Coverage: Please consider ordering Novolog 3 units TID with meals for meal coverage if patient is eating at least 50% of meals.  HgbA1C: A1C 8.6% on 06/24/18 indicating an average glucose of 200 mg/dl over the past 2-3 months. Noted patient has Prednisone listed on outpatient home medication list which is contributing to noted hyperglycemia and elevated A1C.   Thanks, Barnie Alderman, RN, MSN, CDE Diabetes Coordinator Inpatient Diabetes Program [phone] (Team Pager from 8am to 5pm)

## 2018-06-25 NOTE — TOC Initial Note (Signed)
Transition of Care [facility]) - Initial/Assessment Note    Patient Details  Name: Eileen [name] MRN: [id number] Date of Birth: 09-20-1945  [facility] of [facility] CM/SW Contact:    Shade Flood, LCSW Phone Number: 06/25/2018, 11:04 AM  Clinical Narrative:                  Reviewed pt's record today. Pt reports to MD that she is not taking her Xarelto as prescribed apparently due to cost. Pt is active with [facility]. Sent in basket message to [facility] to update. Will follow and assist with TOC needs as pt's stay progresses. Anticipate possible HH vs SNF needs at dc.  Expected Discharge Plan: [place] Barriers to Discharge: Continued Medical Work up   Patient Goals and CMS Choice Patient states their goals for this hospitalization and ongoing recovery are:: rehab and return home CMS Medicare.gov Compare Post Acute Care list provided to:: Patient Choice offered to / list presented to : Patient  Expected Discharge Plan and Services Expected Discharge Plan: [place]       Living arrangements for the past 2 months: Single Family Home                          Prior Living Arrangements/Services Living arrangements for the past 2 months: Single Family Home Lives with:: Adult Children Patient language and need for interpreter reviewed:: Yes Do you feel safe going back to the place where you live?: Yes      Need for Family Participation in Patient Care: No (Comment) Care giver support system in place?: Yes (comment)   Criminal Activity/Legal Involvement Pertinent to Current Situation/Hospitalization: No - Comment as needed  Activities of Daily Living Home Assistive Devices/Equipment: None ADL Screening (condition at time of admission) Patient's cognitive ability adequate to safely complete daily activities?: Yes Is the patient deaf or have difficulty hearing?: No Does the patient have difficulty seeing, even when wearing  glasses/contacts?: No Does the patient have difficulty concentrating, remembering, or making decisions?: No Patient able to express need for assistance with ADLs?: Yes Does the patient have difficulty dressing or bathing?: No Independently performs ADLs?: Yes (appropriate for developmental age) Does the patient have difficulty walking or climbing stairs?: No Weakness of Legs: None Weakness of Arms/Hands: None  Permission Sought/Granted                  Emotional Assessment Appearance:: Appears stated age     Orientation: : Oriented to Self, Oriented to Place, Oriented to  Time, Oriented to Situation Alcohol / Substance Use: Not Applicable Psych Involvement: No (comment)  Admission diagnosis:  Cough [R05] Atrial flutter with rapid ventricular response ([place]) [I48.92] Patient Active Problem List   Diagnosis Date Noted  . Acute upper GI bleeding 01/20/2018  . Lower GI bleed 01/20/2018  . Acute respiratory failure with hypoxia ([place]) 09/17/2017  . Diabetes mellitus ([place]) 08/31/2017  . Acute hypoxemic respiratory failure ([place]) 08/31/2017  . Atrial fibrillation with RVR ([place]) 09/24/2016  . Atrial flutter ([place]) 09/23/2016  . Obstructive chronic bronchitis with exacerbation ([place]) 05/23/2016  . Acute on chronic respiratory failure ([place]) 03/24/2015  . Metabolic encephalopathy 98/33/8250  . HCAP (healthcare-associated pneumonia) 03/24/2015  . Essential hypertension   . Anticoagulation management encounter   . New onset atrial flutter ([place]) 03/21/2015  . Obesity 03/21/2015  . Atrial flutter with rapid ventricular response ([place]) 03/21/2015  . COPD (chronic obstructive  pulmonary disease) ([place]) 12/20/2014  . COPD exacerbation ([place]) 12/20/2014  . DCIS (ductal carcinoma in situ) of breast, right, 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, Arimadex 03/10/2011   PCP:  Lavella Lemons, PA Pharmacy:   [facility] [address] [place] [address] Phone:  [phone] Fax: [phone]     Social Determinants of Health (SDOH) Interventions    Readmission Risk Interventions No flowsheet data found.

## 2018-06-25 NOTE — Progress Notes (Signed)
PROGRESS NOTE    Eileen [name]  TMH:[id number] DOB: 1945-07-27 DOA: 06/24/2018 PCP: Lavella Lemons, PA   Brief Narrative:  Per HPI: Eileen A Sisney is a 73 y.o. female with medical history significant for COPD with chronic hypoxemia on 2 to 3 L nasal cannula at home, type 2 diabetes, hypertension, and atrial flutter on Xarelto who presents to the ED with worsening fatigue and dyspnea on exertion over the last 4 to 5 days.  She actually did not have any symptoms such as chest pain or palpitations and was checking her heart rate at home yesterday and saw that this was quite elevated.  She checked again today and noticed the same therefore prompting the visit.  She denies any cough, fever, chills, vomiting, diarrhea, or abdominal pain.  No aggravating or alleviating factors noted.  Of note, patient has been noncompliant with her Xarelto use at home and states that she does take the medication at different times and will skip doses occasionally because the medication is expensive.  Patient was admitted with atrial flutter with RVR and was started on Cardizem drip for rate control, but she continues to have persistent elevation in her heart rate this morning.  Cardiology has seen patient and started amiodarone drip today with hopes of conversion, otherwise patient will require TEE/DCCV potentially in a.m.  Assessment & Plan:   Principal Problem:   Atrial fibrillation with RVR (HCC) Active Problems:   COPD (chronic obstructive pulmonary disease) ([place])   Essential hypertension   Diabetes mellitus ([place])   1. Atrial flutter with RVR.    Appreciate cardiology evaluation with recommendations of amiodarone drip today.  Possible TEE/DCCV in a.m. as needed with n.p.o. overnight.  Continue on metoprolol.  TSH within normal limits at 2.6. 2. COPD with chronic hypoxemia on home 3 L.  Will order duo nebs as needed for shortness of breath or wheezing.  Currently without any active bronchospasms noted. 3.  Essential hypertension.  Currently soft, but controlled.  Maintain on current medications. 4. Diabetes mellitus with hyperglycemia.  Placed on carb modified diet with SSI along with home metformin.  Start mealtime insulin as recommended by diabetes coordinator.   DVT prophylaxis: Xarelto Code Status: Full Family Communication: Tried contacting daughter and home with no response Disposition Plan: Continue monitoring for heart rate control with possible need for TEE/DCCV in a.m. per cardiology.   Consultants:   Cardiology Dr. Harl Bowie.  Procedures:   None  Antimicrobials:   None   Subjective: Patient seen and evaluated today with no new acute complaints or concerns. No acute concerns or events noted overnight.  She continues to have elevated heart rates, but no chest pain or palpitations.  No shortness of breath.  Objective: Vitals:   06/25/18 0615 06/25/18 0700 06/25/18 0800 06/25/18 0940  BP: (!) 89/50 104/65 (!) 124/97 100/64  Pulse: 72 91 (!) 158 (!) 137  Resp: (!) 23 (!) 22 (!) 21   Temp:   97.7 F (36.5 C)   TempSrc:   Axillary   SpO2: 99% 97% 93%   Weight:      Height:        Intake/Output Summary (Last 24 hours) at 06/25/2018 0957 Last data filed at 06/25/2018 0800 Gross per 24 hour  Intake 234.93 ml  Output 600 ml  Net -365.07 ml   Filed Weights   06/24/18 1811 06/25/18 0500  Weight: 79.4 kg 80.9 kg    Examination:  General exam: Appears calm and comfortable  Respiratory system:  Clear to auscultation. Respiratory effort normal.  Currently on 3 L nasal cannula. Cardiovascular system: S1 & S2 heard, irregular and tachycardic. No JVD, murmurs, rubs, gallops or clicks. No pedal edema. Gastrointestinal system: Abdomen is nondistended, soft and nontender. No organomegaly or masses felt. Normal bowel sounds heard. Central nervous system: Alert and oriented. No focal neurological deficits. Extremities: Symmetric 5 x 5 power. Skin: No rashes, lesions or ulcers  Psychiatry: Judgement and insight appear normal. Mood & affect appropriate.     Data Reviewed: I have personally reviewed following labs and imaging studies  CBC: Recent Labs  Lab 06/24/18 1302 06/25/18 0417  WBC 15.5* 12.1*  HGB 13.0 11.9*  HCT 43.4 39.4  MCV 93.7 94.5  PLT 291 767   Basic Metabolic Panel: Recent Labs  Lab 06/24/18 1302 06/25/18 0417  NA 137 140  K 4.2 3.9  CL 96* 98  CO2 31 34*  GLUCOSE 375* 178*  BUN 25* 20  CREATININE 0.78 0.51  CALCIUM 9.6 9.4  MG  --  2.0   GFR: Estimated Creatinine Clearance: 61.7 mL/min (by C-G formula based on SCr of 0.51 mg/dL). Liver Function Tests: Recent Labs  Lab 06/24/18 1302  AST 19  ALT 20  ALKPHOS 65  BILITOT 0.7  PROT 6.7  ALBUMIN 4.3   No results for input(s): LIPASE, AMYLASE in the last 168 hours. No results for input(s): AMMONIA in the last 168 hours. Coagulation Profile: No results for input(s): INR, PROTIME in the last 168 hours. Cardiac Enzymes: Recent Labs  Lab 06/24/18 1302  TROPONINI 0.03*   BNP (last 3 results) No results for input(s): PROBNP in the last 8760 hours. HbA1C: Recent Labs    06/24/18 1302  HGBA1C 8.6*   CBG: Recent Labs  Lab 06/24/18 1637 06/24/18 2118 06/25/18 0753  GLUCAP 288* 262* 167*   Lipid Profile: No results for input(s): CHOL, HDL, LDLCALC, TRIG, CHOLHDL, LDLDIRECT in the last 72 hours. Thyroid Function Tests: Recent Labs    06/24/18 1302  TSH 2.605   Anemia Panel: No results for input(s): VITAMINB12, FOLATE, FERRITIN, TIBC, IRON, RETICCTPCT in the last 72 hours. Sepsis Labs: No results for input(s): PROCALCITON, LATICACIDVEN in the last 168 hours.  Recent Results (from the past 240 hour(s))  MRSA PCR Screening     Status: None   Collection Time: 06/24/18  4:40 PM  Result Value Ref Range Status   MRSA by PCR NEGATIVE NEGATIVE Final    Comment:        The GeneXpert MRSA Assay (FDA approved for NASAL specimens only), is one component of a  comprehensive MRSA colonization surveillance program. It is not intended to diagnose MRSA infection nor to guide or monitor treatment for MRSA infections. Performed at [facility], [address] [place], [place] [address]          Radiology Studies: Dg Chest 2 View  Result Date: 06/24/2018 CLINICAL DATA:  Cough and shortness of breath EXAM: CHEST - 2 VIEW COMPARISON:  None. FINDINGS: The heart size and mediastinal contours are within normal limits. Left basilar atelectasis. The visualized skeletal structures are unremarkable. IMPRESSION: No active cardiopulmonary disease. Electronically Signed   By: Ulyses Jarred M.D.   On: 06/24/2018 14:02        Scheduled Meds: . fluticasone furoate-vilanterol  1 puff Inhalation Daily   And  . umeclidinium bromide  1 puff Inhalation Daily  . guaiFENesin  600 mg Oral BID  . insulin aspart  0-20 Units Subcutaneous TID WC  .  insulin aspart  0-5 Units Subcutaneous QHS  . mouth rinse  15 mL Mouth Rinse BID  . metFORMIN  500 mg Oral Q breakfast  . metoprolol tartrate  50 mg Oral BID  . vitamin C  500 mg Oral Daily   And  . multivitamin with minerals  1 tablet Oral Daily  . predniSONE  10 mg Oral Daily  . rivaroxaban  20 mg Oral Q supper  . sodium chloride flush  3 mL Intravenous Q12H  . [START ON 06/26/2018] Vitamin D (Ergocalciferol)  50,000 Units Oral Q30 days   Continuous Infusions: . sodium chloride    . amiodarone 60 mg/hr (06/25/18 0947)   Followed by  . amiodarone       LOS: 1 day    Time spent: 30 minutes    Haydn Hutsell Darleen Crocker, DO [facility] Pager [phone]  If 7PM-7AM, please contact night-coverage [email or web address] Password [facility] 06/25/2018, 9:57 AM

## 2018-06-25 NOTE — Patient Outreach (Signed)
[facility]) Care Management  06/25/2018  Eileen [name] [birth year] [id number]    RN Health Coach notified that the patient was admitted to the hospital on 06/24/2018 for Atrial flutter with rapid ventricular response. Hospital Liaison notified.  Plan:  RN Health Coach will close the program at this time due to admission and sent discipline letter to physician.  [name] RN, BSN, [facility] Direct Dial:  [phone]  Fax: [phone]

## 2018-06-26 DIAGNOSIS — I10 Essential (primary) hypertension: Secondary | ICD-10-CM

## 2018-06-26 DIAGNOSIS — J449 Chronic obstructive pulmonary disease, unspecified: Secondary | ICD-10-CM

## 2018-06-26 DIAGNOSIS — E1169 Type 2 diabetes mellitus with other specified complication: Secondary | ICD-10-CM

## 2018-06-26 LAB — GLUCOSE, CAPILLARY
Glucose-Capillary: 175 mg/dL — ABNORMAL HIGH (ref 70–99)
Glucose-Capillary: 237 mg/dL — ABNORMAL HIGH (ref 70–99)
Glucose-Capillary: 151 mg/dL — ABNORMAL HIGH (ref 70–99)
Glucose-Capillary: 188 mg/dL — ABNORMAL HIGH (ref 70–99)

## 2018-06-26 LAB — CBC
HCT: 41 % (ref 36.0–46.0)
Hemoglobin: 12.4 g/dL (ref 12.0–15.0)
MCH: 28.4 pg (ref 26.0–34.0)
MCHC: 30.2 g/dL (ref 30.0–36.0)
MCV: 93.8 fL (ref 80.0–100.0)
Platelets: 251 10*3/uL (ref 150–400)
RBC: 4.37 MIL/uL (ref 3.87–5.11)
RDW: 15.1 % (ref 11.5–15.5)
WBC: 11.9 10*3/uL — ABNORMAL HIGH (ref 4.0–10.5)
nRBC: 0.2 % (ref 0.0–0.2)

## 2018-06-26 LAB — BASIC METABOLIC PANEL
Anion gap: 12 (ref 5–15)
BUN: 22 mg/dL (ref 8–23)
CO2: 33 mmol/L — ABNORMAL HIGH (ref 22–32)
Calcium: 9.6 mg/dL (ref 8.9–10.3)
Chloride: 95 mmol/L — ABNORMAL LOW (ref 98–111)
Creatinine, Ser: 0.63 mg/dL (ref 0.44–1.00)
GFR calc Af Amer: >60 mL/min (ref >60)
GFR calc non Af Amer: >60 mL/min (ref >60)
Glucose, Bld: 192 mg/dL — ABNORMAL HIGH (ref 70–99)
Potassium: 3.6 mmol/L (ref 3.5–5.1)
Sodium: 140 mmol/L (ref 135–145)

## 2018-06-26 LAB — MAGNESIUM: Magnesium: 2 mg/dL (ref 1.7–2.4)

## 2018-06-26 MED ORDER — VITAMIN D (ERGOCALCIFEROL) 1.25 MG (50000 UNIT) PO CAPS
50000.0000 [IU] | ORAL_CAPSULE | ORAL | Status: DC
  Administered 2018-06-26: 50000 [IU] via ORAL
  Filled 2018-06-26: qty 1

## 2018-06-26 MED ORDER — AMIODARONE LOAD VIA INFUSION
150.0000 mg | Freq: Once | INTRAVENOUS | Status: AC
  Administered 2018-06-26: 150 mg via INTRAVENOUS
  Filled 2018-06-26: qty 83.34

## 2018-06-26 MED ORDER — DILTIAZEM HCL 25 MG/5ML IV SOLN
10.0000 mg | Freq: Once | INTRAVENOUS | Status: AC
  Administered 2018-06-26: 10 mg via INTRAVENOUS
  Filled 2018-06-26: qty 5

## 2018-06-26 MED ORDER — BISACODYL 10 MG RE SUPP
10.0000 mg | Freq: Once | RECTAL | Status: AC
  Administered 2018-06-26: 10 mg via RECTAL
  Filled 2018-06-26: qty 1

## 2018-06-26 NOTE — Progress Notes (Signed)
PROGRESS NOTE    Eileen [name]  DUK:[id number] DOB: [birth year] DOA: 06/24/2018 PCP: Lavella Lemons, PA   Brief Narrative:  Per HPI: Eileen [name] is a 73 y.o. female with medical history significant for COPD with chronic hypoxemia on 2 to 3 L nasal cannula at home, type 2 diabetes, hypertension, and atrial flutter on Xarelto who presents to the ED with worsening fatigue and dyspnea on exertion over the last 4 to 5 days.  She actually did not have any symptoms such as chest pain or palpitations and was checking her heart rate at home yesterday and saw that this was quite elevated.  She checked again today and noticed the same therefore prompting the visit.  She denies any cough, fever, chills, vomiting, diarrhea, or abdominal pain.  No aggravating or alleviating factors noted.  Of note, patient has been noncompliant with her Xarelto use at home and states that she does take the medication at different times and will skip doses occasionally because the medication is expensive.  Patient was admitted with atrial flutter with RVR and was started on Cardizem drip for rate control, but she continues to have persistent elevation in her heart rate this morning.  Cardiology has seen patient and started amiodarone drip today with hopes of conversion, otherwise patient will require TEE/DCCV potentially in a.m.  Assessment & Plan:   Principal Problem:   Atrial fibrillation with RVR (HCC) Active Problems:   COPD (chronic obstructive pulmonary disease) ([place])   Essential hypertension   Diabetes mellitus ([place])   1. Atrial flutter with RVR.    Appreciate cardiology evaluation with recommendations of amiodarone drip.  Since she remains tachycardic, she has been re-bolused with amiodarone today.  TEE/DCCV will be scheduled for tomorrow morning if she does not convert overnight.  She will be kept n.p.o. overnight.  Continue on metoprolol.  TSH within normal limits at 2.6.  Can use PRN IV diltiazem for  tachycardia.  She appears to be relatively asymptomatic despite being tachycardic 2. COPD with chronic hypoxemia on home 3 L.  Will order duo nebs as needed for shortness of breath or wheezing.  Currently without any active bronchospasms noted. 3. Essential hypertension.  Currently soft, but controlled.  Maintain on current medications. 4. Diabetes mellitus with hyperglycemia.  Placed on carb modified diet with SSI along with home metformin.  Start mealtime insulin as recommended by diabetes coordinator.   DVT prophylaxis: Xarelto Code Status: Full Family Communication: Inform patient.  She did not request any family members being born. Disposition Plan: Continue monitoring for heart rate control with possible need for TEE/DCCV in a.m. per cardiology.   Consultants:   Cardiology Dr. Harl Bowie.  Procedures:   None  Antimicrobials:   None   Subjective: Denies any shortness of breath or chest pain.  Does not feel any palpitations.  Noted to be tachycardic.  Objective: Vitals:   06/26/18 1300 06/26/18 1400 06/26/18 1500 06/26/18 1621  BP: 114/89 94/68 108/78   Pulse: (!) 142 (!) 146 (!) 146 (!) 152  Resp: 17 16 (!) 37 (!) 27  Temp:    97.9 F (36.6 C)  TempSrc:    Oral  SpO2: 92% 97% 99% 97%  Weight:      Height:        Intake/Output Summary (Last 24 hours) at 06/26/2018 1905 Last data filed at 06/26/2018 1500 Gross per 24 hour  Intake 430.88 ml  Output 750 ml  Net -319.12 ml   [employer]  06/24/18 1811 06/25/18 0500 06/26/18 0500  Weight: 79.4 kg 80.9 kg 79.9 kg    Examination:  General exam: Alert, awake, oriented x 3 Respiratory system: Clear to auscultation. Respiratory effort normal. Cardiovascular system: Tachycardic. No murmurs, rubs, gallops. Gastrointestinal system: Abdomen is nondistended, soft and nontender. No organomegaly or masses felt. Normal bowel sounds heard. Central nervous system: Alert and oriented. No focal neurological deficits.  Extremities: No C/C/E, +pedal pulses Skin: No rashes, lesions or ulcers Psychiatry: Judgement and insight appear normal. Mood & affect appropriate.     Data Reviewed: I have personally reviewed following labs and imaging studies  CBC: Recent Labs  Lab 06/24/18 1302 06/25/18 0417 06/26/18 0357  WBC 15.5* 12.1* 11.9*  HGB 13.0 11.9* 12.4  HCT 43.4 39.4 41.0  MCV 93.7 94.5 93.8  PLT 291 253 884   Basic Metabolic Panel: Recent Labs  Lab 06/24/18 1302 06/25/18 0417 06/26/18 0357  NA 137 140 140  K 4.2 3.9 3.6  CL 96* 98 95*  CO2 31 34* 33*  GLUCOSE 375* 178* 192*  BUN 25* 20 22  CREATININE 0.78 0.51 0.63  CALCIUM 9.6 9.4 9.6  MG  --  2.0 2.0   GFR: Estimated Creatinine Clearance: 61.3 mL/min (by C-G formula based on SCr of 0.63 mg/dL). Liver Function Tests: Recent Labs  Lab 06/24/18 1302  AST 19  ALT 20  ALKPHOS 65  BILITOT 0.7  PROT 6.7  ALBUMIN 4.3   No results for input(s): LIPASE, AMYLASE in the last 168 hours. No results for input(s): AMMONIA in the last 168 hours. Coagulation Profile: No results for input(s): INR, PROTIME in the last 168 hours. Cardiac Enzymes: Recent Labs  Lab 06/24/18 1302  TROPONINI 0.03*   BNP (last 3 results) No results for input(s): PROBNP in the last 8760 hours. HbA1C: Recent Labs    06/24/18 1302  HGBA1C 8.6*   CBG: Recent Labs  Lab 06/25/18 1631 06/25/18 2103 06/26/18 0808 06/26/18 1132 06/26/18 1619  GLUCAP 300* 195* 175* 237* 151*   Lipid Profile: No results for input(s): CHOL, HDL, LDLCALC, TRIG, CHOLHDL, LDLDIRECT in the last 72 hours. Thyroid Function Tests: Recent Labs    06/24/18 1302  TSH 2.605   Anemia Panel: No results for input(s): VITAMINB12, FOLATE, FERRITIN, TIBC, IRON, RETICCTPCT in the last 72 hours. Sepsis Labs: No results for input(s): PROCALCITON, LATICACIDVEN in the last 168 hours.  Recent Results (from the past 240 hour(s))  MRSA PCR Screening     Status: None   Collection  Time: 06/24/18  4:40 PM  Result Value Ref Range Status   MRSA by PCR NEGATIVE NEGATIVE Final    Comment:        The GeneXpert MRSA Assay (FDA approved for NASAL specimens only), is one component of a comprehensive MRSA colonization surveillance program. It is not intended to diagnose MRSA infection nor to guide or monitor treatment for MRSA infections. Performed at [facility], [address], [place], [place] [address]          Radiology Studies: No results found.      Scheduled Meds: . fluticasone furoate-vilanterol  1 puff Inhalation Daily   And  . umeclidinium bromide  1 puff Inhalation Daily  . guaiFENesin  600 mg Oral BID  . insulin aspart  0-20 Units Subcutaneous TID WC  . insulin aspart  0-5 Units Subcutaneous QHS  . mouth rinse  15 mL Mouth Rinse BID  . metFORMIN  500 mg Oral Q breakfast  . metoprolol  tartrate  50 mg Oral BID  . vitamin C  500 mg Oral Daily   And  . multivitamin with minerals  1 tablet Oral Daily  . predniSONE  10 mg Oral Daily  . rivaroxaban  20 mg Oral Q supper  . sodium chloride flush  3 mL Intravenous Q12H  . Vitamin D (Ergocalciferol)  50,000 Units Oral Q30 days   Continuous Infusions: . sodium chloride    . amiodarone 30 mg/hr (06/26/18 1519)     LOS: 2 days    Time spent: 30 minutes    Kathie Dike, MD [facility]  If 7PM-7AM, please contact night-coverage [email or web address] Password [facility] 06/26/2018, 7:05 PM

## 2018-06-26 NOTE — Progress Notes (Signed)
Inpatient Diabetes Program Recommendations  AACE/ADA: New Consensus Statement on Inpatient Glycemic Control   Target Ranges:  Prepandial:   less than 140 mg/dL      Peak postprandial:   less than 180 mg/dL (1-2 hours)      Critically ill patients:  140 - 180 mg/dL   Results for Eileen [name], Eileen [name] (MRN [id number]) as of 06/26/2018 07:11  Ref. Range 06/25/2018 07:53 06/25/2018 11:44 06/25/2018 16:31 06/25/2018 21:03  Glucose-Capillary Latest Ref Range: 70 - 99 mg/dL 167 (H) 245 (H) 300 (H) 195 (H)    Results for Eileen [name], Eileen [name] (MRN [id number]) as of 06/26/2018 07:11  Ref. Range 06/26/2018 03:57  Glucose Latest Ref Range: 70 - 99 mg/dL 192 (H)   Review of Glycemic Control  Diabetes history: DM2 Outpatient Diabetes medications: Metformin XR 500 mg BID Current orders for Inpatient glycemic control: Novolog 0-20 units TID with meals, Novolog 0-5 units QHS, Metformin XR 50 mg BID; Prednisone 10 mg daily  Inpatient Diabetes Program Recommendations:   Insulin - Meal Coverage: When diet resumed today, please consider ordering Novolog 5 units TID with meals for meal coverage if patient is eating at least 50% of meals.  Thanks, Barnie Alderman, RN, MSN, CDE Diabetes Coordinator Inpatient Diabetes Program [phone] (Team Pager from 8am to 5pm)

## 2018-06-26 NOTE — Progress Notes (Signed)
We have scheduled TEE/cardioversion tomorrow at [facility] for patient. Repeat EKG at 6AM to verify still in aflutter   Zandra Abts MD

## 2018-06-26 NOTE — Progress Notes (Signed)
Progress Note  Patient Name: [name] Felicetti Date of Encounter: 06/26/2018  Primary Cardiologist: Kate Sable, MD   Subjective   No complaints  Inpatient Medications    Scheduled Meds: . amiodarone  150 mg Intravenous Once  . fluticasone furoate-vilanterol  1 puff Inhalation Daily   And  . umeclidinium bromide  1 puff Inhalation Daily  . guaiFENesin  600 mg Oral BID  . insulin aspart  0-20 Units Subcutaneous TID WC  . insulin aspart  0-5 Units Subcutaneous QHS  . mouth rinse  15 mL Mouth Rinse BID  . metFORMIN  500 mg Oral Q breakfast  . metoprolol tartrate  50 mg Oral BID  . vitamin C  500 mg Oral Daily   And  . multivitamin with minerals  1 tablet Oral Daily  . predniSONE  10 mg Oral Daily  . rivaroxaban  20 mg Oral Q supper  . sodium chloride flush  3 mL Intravenous Q12H  . Vitamin D (Ergocalciferol)  50,000 Units Oral Q30 days   Continuous Infusions: . sodium chloride    . amiodarone 30 mg/hr (06/26/18 0500)   PRN Meds: sodium chloride, acetaminophen, levalbuterol, ondansetron **OR** ondansetron (ZOFRAN) IV, sodium chloride flush   Vital Signs    Vitals:   06/26/18 0700 06/26/18 0800 06/26/18 0802 06/26/18 0806  BP: 108/80 111/85    Pulse: (!) 154 (!) 153  (!) 156  Resp: (!) 22 (!) 23  (!) 24  Temp:    98.4 F (36.9 C)  TempSrc:    Oral  SpO2: 95% 99% 99% 96%  Weight:      Height:        Intake/Output Summary (Last 24 hours) at 06/26/2018 0920 Last data filed at 06/26/2018 0700 Gross per 24 hour  Intake 1724.47 ml  Output 950 ml  Net 774.47 ml   Last 3 Weights 06/26/2018 06/25/2018 06/24/2018  Weight (lbs) 176 lb 2.4 oz 178 lb 5.6 oz 175 lb 0.7 oz  Weight (kg) 79.9 kg 80.9 kg 79.4 kg      Telemetry    Aflutter with RVR, 150s - Personally Reviewed  ECG    na - Personally Reviewed  Physical Exam   GEN: No acute distress.   Neck: No JVD Cardiac: regular tach, no murmurs, rubs, or gallops.  Respiratory: Clear to auscultation  bilaterally. GI: Soft, nontender, non-distended  MS: No edema; No deformity. Neuro:  Nonfocal  Psych: Normal affect   Labs    Chemistry Recent Labs  Lab 06/24/18 1302 06/25/18 0417 06/26/18 0357  NA 137 140 140  K 4.2 3.9 3.6  CL 96* 98 95*  CO2 31 34* 33*  GLUCOSE 375* 178* 192*  BUN 25* 20 22  CREATININE 0.78 0.51 0.63  CALCIUM 9.6 9.4 9.6  PROT 6.7  --   --   ALBUMIN 4.3  --   --   AST 19  --   --   ALT 20  --   --   ALKPHOS 65  --   --   BILITOT 0.7  --   --   GFRNONAA >60 >60 >60  GFRAA >60 >60 >60  ANIONGAP 10 8 12      Hematology Recent Labs  Lab 06/24/18 1302 06/25/18 0417 06/26/18 0357  WBC 15.5* 12.1* 11.9*  RBC 4.63 4.17 4.37  HGB 13.0 11.9* 12.4  HCT 43.4 39.4 41.0  MCV 93.7 94.5 93.8  MCH 28.1 28.5 28.4  MCHC 30.0 30.2 30.2  RDW 15.5 15.3  15.1  PLT 291 253 251    Cardiac Enzymes Recent Labs  Lab 06/24/18 1302  TROPONINI 0.03*   No results for input(s): TROPIPOC in the last 168 hours.   BNP Recent Labs  Lab 06/24/18 1302  BNP 894.0*     DDimer No results for input(s): DDIMER in the last 168 hours.   Radiology    Dg Chest 2 View  Result Date: 06/24/2018 CLINICAL DATA:  Cough and shortness of breath EXAM: CHEST - 2 VIEW COMPARISON:  None. FINDINGS: The heart size and mediastinal contours are within normal limits. Left basilar atelectasis. The visualized skeletal structures are unremarkable. IMPRESSION: No active cardiopulmonary disease. Electronically Signed   By: Ulyses Jarred M.D.   On: 06/24/2018 14:02    Cardiac Studies    Patient Profile     [name] Spiller is a 73 y.o. female with a hx of paroxysmal aflutter and afib who is being seen today for the evaluation of aflutter with RVR at the request of Dr Manuella Ghazi.  Assessment & Plan    1. Aflutter with RVR - had missed a dose of xarelto within last 3 weeks, thus could not cardiovert in ER - Has been on metropolol 50mg  bid at home and dilt 240 along with xarelto - rates did  not improve on IV dilt high dose, we started amiodarone gtt.  - remains in aflutter with RVR this AM, rebolus amio this AM and continue drip. Load for additional 24 hrs, hopefully will self convert, if not plan for TEE/DCCV tomorrow. Will make npo at midnight.  - can use prn IV dilt as needed for tachycardia today.     For questions or updates, please contact [place] Please consult [email or web address] for contact info under        Signed, Carlyle Dolly, MD  06/26/2018, 9:20 AM

## 2018-06-27 ENCOUNTER — Inpatient Hospital Stay (HOSPITAL_COMMUNITY): Payer: Medicare Other | Admitting: Anesthesiology

## 2018-06-27 ENCOUNTER — Inpatient Hospital Stay (HOSPITAL_COMMUNITY): Payer: Medicare Other

## 2018-06-27 ENCOUNTER — Other Ambulatory Visit: Payer: Self-pay

## 2018-06-27 ENCOUNTER — Other Ambulatory Visit (HOSPITAL_COMMUNITY): Payer: Medicare Other

## 2018-06-27 ENCOUNTER — Encounter (HOSPITAL_COMMUNITY): Payer: Self-pay | Admitting: Anesthesiology

## 2018-06-27 ENCOUNTER — Encounter (HOSPITAL_COMMUNITY)
Admission: EM | Disposition: A | Discharge status: Home Health | Payer: Self-pay | Source: Home / Self Care | Attending: Internal Medicine

## 2018-06-27 DIAGNOSIS — I361 Nonrheumatic tricuspid (valve) insufficiency: Secondary | ICD-10-CM

## 2018-06-27 DIAGNOSIS — I351 Nonrheumatic aortic (valve) insufficiency: Secondary | ICD-10-CM

## 2018-06-27 DIAGNOSIS — I340 Nonrheumatic mitral (valve) insufficiency: Secondary | ICD-10-CM

## 2018-06-27 HISTORY — PX: TEE WITHOUT CARDIOVERSION: SHX5443

## 2018-06-27 HISTORY — PX: CARDIOVERSION: SHX1299

## 2018-06-27 LAB — GLUCOSE, CAPILLARY
Glucose-Capillary: 173 mg/dL — ABNORMAL HIGH (ref 70–99)
Glucose-Capillary: 162 mg/dL — ABNORMAL HIGH (ref 70–99)
Glucose-Capillary: 142 mg/dL — ABNORMAL HIGH (ref 70–99)
Glucose-Capillary: 134 mg/dL — ABNORMAL HIGH (ref 70–99)
Glucose-Capillary: 181 mg/dL — ABNORMAL HIGH (ref 70–99)
Glucose-Capillary: 211 mg/dL — ABNORMAL HIGH (ref 70–99)

## 2018-06-27 SURGERY — CARDIOVERSION
Anesthesia: General

## 2018-06-27 MED ORDER — MIDAZOLAM HCL 2 MG/2ML IJ SOLN: 0.5000 mg | Freq: Once | INTRAMUSCULAR | Status: DC | PRN

## 2018-06-27 MED ORDER — LACTATED RINGERS IV SOLN
INTRAVENOUS | Status: DC
  Administered 2018-06-27 (×2): via INTRAVENOUS

## 2018-06-27 MED ORDER — PROMETHAZINE HCL 25 MG/ML IJ SOLN: 6.2500 mg | INTRAMUSCULAR | Status: DC | PRN

## 2018-06-27 MED ORDER — SODIUM CHLORIDE 0.9 % IV SOLN: INTRAVENOUS | Status: DC

## 2018-06-27 MED ORDER — SODIUM CHLORIDE 0.9% FLUSH: 3.0000 mL | Freq: Two times a day (BID) | INTRAVENOUS | Status: DC

## 2018-06-27 MED ORDER — KETAMINE HCL 10 MG/ML IJ SOLN
INTRAMUSCULAR | Status: DC | PRN
  Administered 2018-06-27 (×2): 10 mg via INTRAVENOUS

## 2018-06-27 MED ORDER — KETAMINE HCL 50 MG/5ML IJ SOSY
PREFILLED_SYRINGE | INTRAMUSCULAR | Status: AC
  Filled 2018-06-27: qty 5

## 2018-06-27 MED ORDER — PERFLUTREN LIPID MICROSPHERE
INTRAVENOUS | Status: AC
  Filled 2018-06-27: qty 10

## 2018-06-27 MED ORDER — SODIUM CHLORIDE 0.9 % IV SOLN: 250.0000 mL | INTRAVENOUS | Status: DC

## 2018-06-27 MED ORDER — PHENOL 1.4 % MT LIQD
1.0000 | OROMUCOSAL | Status: DC | PRN
  Administered 2018-06-27: 1 via OROMUCOSAL
  Filled 2018-06-27: qty 177

## 2018-06-27 MED ORDER — SODIUM CHLORIDE 0.9% FLUSH: 3.0000 mL | INTRAVENOUS | Status: DC | PRN

## 2018-06-27 MED ORDER — PROPOFOL 10 MG/ML IV BOLUS
INTRAVENOUS | Status: DC | PRN
  Administered 2018-06-27 (×2): 20 mg via INTRAVENOUS

## 2018-06-27 MED ORDER — PROPOFOL 10 MG/ML IV BOLUS
INTRAVENOUS | Status: AC
  Filled 2018-06-27: qty 20

## 2018-06-27 MED ORDER — AMIODARONE HCL 200 MG PO TABS
200.0000 mg | ORAL_TABLET | Freq: Two times a day (BID) | ORAL | Status: DC
  Administered 2018-06-27 – 2018-06-28 (×3): 200 mg via ORAL
  Filled 2018-06-27 (×3): qty 1

## 2018-06-27 MED ORDER — PROPOFOL 500 MG/50ML IV EMUL
INTRAVENOUS | Status: DC | PRN
  Administered 2018-06-27: 75 ug/kg/min via INTRAVENOUS

## 2018-06-27 NOTE — CV Procedure (Signed)
Electrical Cardioversion Procedure Note Eileen [name] [id number]  Nov 17, 1945  Procedure: Electrical Cardioversion Indications: Atrial Fibrillation Procedure Details Consent: verified Time Out: Verified patient identification, verified procedure, site/side was marked, verified correct patient position, special equipment/implants available, medications/allergies/relevent history reviewed, required imaging and test results available.  0920  Patient placed on cardiac monitor, pulse oximetry, supplemental oxygen as necessary.  Sedation given: per anesthesia Pacer pads placed Anterior/Posterior with sand bag verified by Dr. Harl Bowie  Cardioverted 1 time(s).  Cardioverted at  200 Joules  Evaluation Findings: Post procedure EKG shows: Normal Sinus Rhythm Complications: none Patient Did} tolerate procedure well.   Lorna Few 06/27/2018, 11:38 AM

## 2018-06-27 NOTE — CV Procedure (Signed)
Procedure: TEE/Electrical cardioversion Physician: Dr Carlyle Dolly MD Indication: atrial flutter   Patient was brought to the procedure suite after appropriate consent was obtained. Sedation was achieved with the assistance of anesthesiology, please see there note for details. She was layed into the lateral decubitus position and TEE probe was intubated without difficulty. Due to oxygenation issues the test was abbreviated. There was no evidence of intracardiac thrombus. We then proceeded to cardioversion. Defib pads were placed in the anterior and posterior positions, she was succesfullly cardioverted with a single synchronized 200j shock to SR. Cardiopulmonary monitoring was performed throughout the procedure without complications.   Zandra Abts MD

## 2018-06-27 NOTE — Progress Notes (Signed)
Pt sent down to pre-op for cardioversion with TEE.

## 2018-06-27 NOTE — Progress Notes (Signed)
*  PRELIMINARY RESULTS* Echocardiogram Echocardiogram Transesophageal has been performed.  Leavy Cella 06/27/2018, 11:10 AM

## 2018-06-27 NOTE — Anesthesia Postprocedure Evaluation (Signed)
Anesthesia Post Note  Patient: Eileen [name]  Procedure(s) Performed: CARDIOVERSION (N/A ) TRANSESOPHAGEAL ECHOCARDIOGRAM (TEE) WITH PROPOFOL (N/A )  Patient location during evaluation: PACU Anesthesia Type: General Level of consciousness: awake and alert and oriented Pain management: pain level controlled Vital Signs Assessment: post-procedure vital signs reviewed and stable Respiratory status: spontaneous breathing and patient connected to nasal cannula oxygen Cardiovascular status: stable Anesthetic complications: no     Last Vitals:  Vitals:   06/27/18 0827 06/27/18 0951  BP:  (!) 111/59  Pulse:  90  Resp:    Temp: 36.7 C 36.6 C  SpO2:  96%    Last Pain:  Vitals:   06/27/18 0951  TempSrc:   PainSc: 0-No pain                 ADAMS, AMY A

## 2018-06-27 NOTE — Progress Notes (Signed)
PROGRESS NOTE    Eileen A Perfetti  CBJ:[id number] DOB: [birth year] DOA: 06/24/2018 PCP: Lavella Lemons, PA   Brief Narrative:  Per HPI: Eileen [name] is a 73 y.o. female with medical history significant for COPD with chronic hypoxemia on 2 to 3 L nasal cannula at home, type 2 diabetes, hypertension, and atrial flutter on Xarelto who presents to the ED with worsening fatigue and dyspnea on exertion over the last 4 to 5 days.  She actually did not have any symptoms such as chest pain or palpitations and was checking her heart rate at home yesterday and saw that this was quite elevated.  She checked again today and noticed the same therefore prompting the visit.  She denies any cough, fever, chills, vomiting, diarrhea, or abdominal pain.  No aggravating or alleviating factors noted.  Of note, patient has been noncompliant with her Xarelto use at home and states that she does take the medication at different times and will skip doses occasionally because the medication is expensive.  Patient was admitted with atrial flutter with RVR.  She was treated with amiodarone infusion, remained tachycardic.  She underwent TEE cardioversion with conversion to sinus rhythm.  Monitor another 24 hours to ensure she remains stable.  Assessment & Plan:   Principal Problem:   Atrial fibrillation with RVR (HCC) Active Problems:   COPD (chronic obstructive pulmonary disease) ([place])   Essential hypertension   Diabetes mellitus ([place])   1. Atrial flutter with RVR.    She was initially treated with intravenous amiodarone infusion, but remained in rapid flutter.  She underwent TEE cardioversion and has been maintaining sinus rhythm since that time. Continue on metoprolol.  TSH within normal limits at 2.6.  Can use PRN IV diltiazem for tachycardia.  She is anticoagulated with Xarelto.  Monitor another 24 hours to ensure stability of heart rate. 2. COPD with chronic hypoxemia on home 3 L.  Will order duo nebs as needed  for shortness of breath or wheezing.  Currently without any active bronchospasms noted. 3. Essential hypertension.  Currently soft, but controlled.  Maintain on current medications. 4. Diabetes mellitus with hyperglycemia.  Placed on carb modified diet with SSI along with home metformin.  She is also on meal coverage NovoLog.  Blood sugar stable.   DVT prophylaxis: Xarelto Code Status: Full Family Communication: Informed patient.  She did not request any family members being contacted. Disposition Plan: Likely discharge home in the morning if remains in sinus rhythm and heart rate is stable.   Consultants:   Cardiology Dr. Harl Bowie.  Procedures:   None  Antimicrobials:   None   Subjective: Patient seen in room post cardioversion.  She is feeling better.  No shortness of breath.  Objective: Vitals:   06/27/18 1600 06/27/18 1652 06/27/18 1700 06/27/18 1800  BP: 110/74  110/83 102/65  Pulse: 81 86 87 94  Resp:      Temp:  98.4 F (36.9 C)    TempSrc:  Oral    SpO2: 100% 100% 94% 99%  Weight:      Height:        Intake/Output Summary (Last 24 hours) at 06/27/2018 1905 Last data filed at 06/27/2018 1354 Gross per 24 hour  Intake 865.79 ml  Output 1900 ml  Net -1034.21 ml   Filed Weights   06/24/18 1811 06/25/18 0500 06/26/18 0500  Weight: 79.4 kg 80.9 kg 79.9 kg    Examination:  General exam: Alert, awake, oriented x 3 Respiratory system:  Clear to auscultation. Respiratory effort normal. Cardiovascular system: Tachycardic. No murmurs, rubs, gallops. Gastrointestinal system: Abdomen is nondistended, soft and nontender. No organomegaly or masses felt. Normal bowel sounds heard. Central nervous system: Alert and oriented. No focal neurological deficits. Extremities: No C/C/E, +pedal pulses Skin: No rashes, lesions or ulcers Psychiatry: Judgement and insight appear normal. Mood & affect appropriate.     Data Reviewed: I have personally reviewed following labs and  imaging studies  CBC: Recent Labs  Lab 06/24/18 1302 06/25/18 0417 06/26/18 0357  WBC 15.5* 12.1* 11.9*  HGB 13.0 11.9* 12.4  HCT 43.4 39.4 41.0  MCV 93.7 94.5 93.8  PLT 291 253 390   Basic Metabolic Panel: Recent Labs  Lab 06/24/18 1302 06/25/18 0417 06/26/18 0357  NA 137 140 140  K 4.2 3.9 3.6  CL 96* 98 95*  CO2 31 34* 33*  GLUCOSE 375* 178* 192*  BUN 25* 20 22  CREATININE 0.78 0.51 0.63  CALCIUM 9.6 9.4 9.6  MG  --  2.0 2.0   GFR: Estimated Creatinine Clearance: 61.3 mL/min (by C-G formula based on SCr of 0.63 mg/dL). Liver Function Tests: Recent Labs  Lab 06/24/18 1302  AST 19  ALT 20  ALKPHOS 65  BILITOT 0.7  PROT 6.7  ALBUMIN 4.3   No results for input(s): LIPASE, AMYLASE in the last 168 hours. No results for input(s): AMMONIA in the last 168 hours. Coagulation Profile: No results for input(s): INR, PROTIME in the last 168 hours. Cardiac Enzymes: Recent Labs  Lab 06/24/18 1302  TROPONINI 0.03*   BNP (last 3 results) No results for input(s): PROBNP in the last 8760 hours. HbA1C: No results for input(s): HGBA1C in the last 72 hours. CBG: Recent Labs  Lab 06/27/18 0750 06/27/18 0835 06/27/18 0959 06/27/18 1122 06/27/18 1651  GLUCAP 173* 162* 142* 134* 181*   Lipid Profile: No results for input(s): CHOL, HDL, LDLCALC, TRIG, CHOLHDL, LDLDIRECT in the last 72 hours. Thyroid Function Tests: No results for input(s): TSH, T4TOTAL, FREET4, T3FREE, THYROIDAB in the last 72 hours. Anemia Panel: No results for input(s): VITAMINB12, FOLATE, FERRITIN, TIBC, IRON, RETICCTPCT in the last 72 hours. Sepsis Labs: No results for input(s): PROCALCITON, LATICACIDVEN in the last 168 hours.  Recent Results (from the past 240 hour(s))  MRSA PCR Screening     Status: None   Collection Time: 06/24/18  4:40 PM  Result Value Ref Range Status   MRSA by PCR NEGATIVE NEGATIVE Final    Comment:        The GeneXpert MRSA Assay (FDA approved for NASAL specimens  only), is one component of a comprehensive MRSA colonization surveillance program. It is not intended to diagnose MRSA infection nor to guide or monitor treatment for MRSA infections. Performed at [facility], [address] [place], [place] [address]          Radiology Studies: No results found.      Scheduled Meds: . amiodarone  200 mg Oral BID  . fluticasone furoate-vilanterol  1 puff Inhalation Daily   And  . umeclidinium bromide  1 puff Inhalation Daily  . guaiFENesin  600 mg Oral BID  . insulin aspart  0-20 Units Subcutaneous TID WC  . insulin aspart  0-5 Units Subcutaneous QHS  . mouth rinse  15 mL Mouth Rinse BID  . metFORMIN  500 mg Oral Q breakfast  . metoprolol tartrate  50 mg Oral BID  . vitamin C  500 mg Oral Daily   And  .  multivitamin with minerals  1 tablet Oral Daily  . predniSONE  10 mg Oral Daily  . rivaroxaban  20 mg Oral Q supper  . sodium chloride flush  3 mL Intravenous Q12H  . Vitamin D (Ergocalciferol)  50,000 Units Oral Q30 days   Continuous Infusions: . sodium chloride       LOS: 3 days    Time spent: 30 minutes    Kathie Dike, MD [facility]  If 7PM-7AM, please contact night-coverage [email or web address] Password [facility] 06/27/2018, 7:05 PM

## 2018-06-27 NOTE — TOC Progression Note (Signed)
Transition of Care [facility]) - Progression Note    Patient Details  Name: Eileen [name] MRN: [id number] Date of Birth: 01/12/1946  Transition of Care [facility]) CM/SW Contact  Shade Flood, LCSW Phone Number: 06/27/2018, 11:26 AM  Clinical Narrative:     Following with chart review and MD updates. Pt had cardioversion today. MD anticipating possible dc tomorrow. Discussed possible PT evaluation before dc to assess for any HH follow up needs. Will follow up tomorrow to assist.  Expected Discharge Plan: [place] Barriers to Discharge: Continued Medical Work up  Expected Discharge Plan and Services Expected Discharge Plan: [place] arrangements for the past 2 months: Single Family Home                           Social Determinants of Health (SDOH) Interventions    Readmission Risk Interventions No flowsheet data found.

## 2018-06-27 NOTE — Transfer of Care (Signed)
Immediate Anesthesia Transfer of Care Note  Patient: Eileen [name]  Procedure(s) Performed: CARDIOVERSION (N/A ) TRANSESOPHAGEAL ECHOCARDIOGRAM (TEE) WITH PROPOFOL (N/A )  Patient Location: PACU  Anesthesia Type:General  Level of Consciousness: awake, oriented and patient cooperative  Airway & Oxygen Therapy: Patient Spontanous Breathing and Patient connected to nasal cannula oxygen  Post-op Assessment: Report given to RN and Post -op Vital signs reviewed and stable  Post vital signs: Reviewed and stable  Last Vitals:  Vitals Value Taken Time  BP    Temp    Pulse    Resp    SpO2      Last Pain:  Vitals:   06/27/18 0827  TempSrc: Oral  PainSc:       Patients Stated Pain Goal: 0 (99/67/22 7737)  Complications: No apparent anesthesia complications

## 2018-06-27 NOTE — Anesthesia Procedure Notes (Signed)
Date/Time: 06/27/2018 9:12 AM Performed by: Andree Elk Amy A, CRNA Pre-anesthesia Checklist: Patient identified, Emergency Drugs available, Suction available, Patient being monitored and Timeout performed Patient Re-evaluated:Patient Re-evaluated prior to induction Oxygen Delivery Method: Non-rebreather mask

## 2018-06-27 NOTE — Anesthesia Preprocedure Evaluation (Signed)
Anesthesia Evaluation    Airway Mallampati: II  TM Distance: >3 FB Neck ROM: Full    Dental no notable dental hx. (+) Edentulous Upper, Edentulous Lower   Pulmonary shortness of breath, with exertion and Long-Term Oxygen Therapy, asthma , pneumonia, resolved, COPD,  COPD inhaler and oxygen dependent, former smoker,    Pulmonary exam normal breath sounds clear to auscultation + decreased breath sounds      Cardiovascular Exercise Tolerance: Poor hypertension, Pt. on medications and Pt. on home beta blockers Normal cardiovascular exam+ dysrhythmias Atrial Fibrillation II Rhythm:Irregular Rate:Tachycardia  Reports episode of irreg heart beats with pnemonia in past  Uses 02 2.5-3 or more at home / round the clock  Never DCCV in past, unable to convert medically  Unsure about all Xarelto doses  Here for TEE/poss DCCV if no thrombus    Neuro/Psych    GI/Hepatic   Endo/Other  diabetes, Well Controlled, Type 2  Renal/GU      Musculoskeletal   Abdominal   Peds  Hematology  (+) anemia , Reports receiving blood 2u one month ago Did have recent UGI/Colon with Dr. Oneida Alar per record    Anesthesia Other Findings   Reproductive/Obstetrics                             Anesthesia Physical Anesthesia Plan  ASA: IV  Anesthesia Plan: General   Post-op Pain Management:    Induction: Intravenous  PONV Risk Score and Plan:   Airway Management Planned: Nasal Cannula and Simple Face Mask  Additional Equipment:   Intra-op Plan:   Post-operative Plan: Extubation in OR  Informed Consent: I have reviewed the patients History and Physical, chart, labs and discussed the procedure including the risks, benefits and alternatives for the proposed anesthesia with the patient or authorized representative who has indicated his/her understanding and acceptance.     Dental advisory given  Plan Discussed with:  CRNA  Anesthesia Plan Comments: (Full PPE planned  GETA as needed d/w pt - AVU -WTP with same )        Anesthesia Quick Evaluation

## 2018-06-27 NOTE — H&P (Signed)
Please see referenced consult below, patient presents for TEE/cardioversion for aflutter with RVR.   Zandra Abts MD    Patient ID: Eileen [name] MRN: [id number]; DOB: 01-Aug-1945  Admit date: 06/24/2018 Date of Consult: 06/25/2018  Primary Care Provider: Lavella Lemons, PA Primary Cardiologist: Kate Sable, MD  Primary Electrophysiologist:  None    Patient Profile:   Eileen [name] is a 73 y.o. female with a hx of paroxysmal aflutter and afib who is being seen today for the evaluation of aflutter with RVR at the request of Dr Manuella Ghazi.  History of Present Illness:   Eileen [name] 73 yo female history of paroxysmal aflutter and afib, COPD, HTN, DM2. Has been on metropolol 50mg  bid at home and dilt 240 along with xarelto. Presents with fatigue, found to be in aflutter with RVR in ER. She had missed a dose of anticoag within the last 3 weeks and thus was not able to be cardioverted in the ER, started on dilt gtt.   She denies any significant palpitaitons or SOB, just has had fatigue for last several days   WBC 15.5 Plt 291 K 4.2 Cr 0.78 BUN 25 BNP 894 TSH 2.6 Mg 2 Trop 0.03--> CXR no acute process EKG aflutter with RVR     Past Medical History:  Diagnosis Date  . Asthma   . Atrial flutter ([place])   . Cancer [facility])    Right breast  . COPD (chronic obstructive pulmonary disease) ([place])   . History of breast cancer    right breast  . Hypertension   . On home O2   . Type 2 diabetes mellitus ([place])          Past Surgical History:  Procedure Laterality Date  . BREAST SURGERY  2011   Right breast mastectomy  . CESAREAN SECTION    . COLONOSCOPY N/A 01/22/2018   Procedure: COLONOSCOPY;  Surgeon: Rogene Houston, MD;  Location: [facility];  Service: Endoscopy;  Laterality: N/A;  . HERNIA REPAIR     RIH  . MASTECTOMY  2011   right breast      Inpatient Medications: Scheduled Meds: . fluticasone furoate-vilanterol  1 puff Inhalation  Daily   And  . umeclidinium bromide  1 puff Inhalation Daily  . guaiFENesin  600 mg Oral BID  . insulin aspart  0-20 Units Subcutaneous TID WC  . insulin aspart  0-5 Units Subcutaneous QHS  . mouth rinse  15 mL Mouth Rinse BID  . metFORMIN  500 mg Oral Q breakfast  . metoprolol tartrate  50 mg Oral BID  . vitamin C  500 mg Oral Daily   And  . multivitamin with minerals  1 tablet Oral Daily  . predniSONE  10 mg Oral Daily  . rivaroxaban  20 mg Oral Q supper  . sodium chloride flush  3 mL Intravenous Q12H  . [START ON 06/26/2018] Vitamin D (Ergocalciferol)  50,000 Units Oral Q30 days   Continuous Infusions: . sodium chloride    . diltiazem (CARDIZEM) infusion 15 mg/hr (06/25/18 0626)   PRN Meds: sodium chloride, acetaminophen, levalbuterol, ondansetron **OR** ondansetron (ZOFRAN) IV, sodium chloride flush  Allergies:         Allergies  Allergen Reactions  . Codeine Other (See Comments)    "jittery"    Social History:   Social History        Socioeconomic History  . Marital status: Single    Spouse name: Not on file  . Number of  children: Not on file  . Years of education: Not on file  . Highest education level: Not on file  Occupational History  . Not on file  Social Needs  . Financial resource strain: Not hard at all  . Food insecurity:    Worry: Never true    Inability: Never true  . Transportation needs:    Medical: Patient refused    Non-medical: Patient refused  Tobacco Use  . Smoking status: Former Smoker    Packs/day: 0.50    Years: 32.00    Pack years: 16.00    Types: Cigarettes    Start date: 12/12/1962    Last attempt to quit: 03/13/1994    Years since quitting: 24.3  . Smokeless tobacco: Never Used  Substance and Sexual Activity  . Alcohol use: No    Alcohol/week: 0.0 standard drinks  . Drug use: No  . Sexual activity: Not on file  Lifestyle  . Physical activity:    Days per week: Patient refused     Minutes per session: Patient refused  . Stress: Not at all  Relationships  . Social connections:    Talks on phone: Three times a week    Gets together: Three times a week    Attends religious service: More than 4 times per year    Active member of club or organization: Yes    Attends meetings of clubs or organizations: More than 4 times per year    Relationship status: Never married  . Intimate partner violence:    Fear of current or ex partner: No    Emotionally abused: No    Physically abused: No    Forced sexual activity: No  Other Topics Concern  . Not on file  Social History Narrative  . Not on file    Family History:         Family History  Problem Relation Age of Onset  . Cancer Mother        lung  . Diabetes Brother      ROS:  Please see the history of present illness. All other ROS reviewed and negative.     Physical Exam/Data:         Vitals:   06/25/18 0600 06/25/18 0615 06/25/18 0700 06/25/18 0800  BP: 96/68 (!) 89/50 104/65 (!) 124/97  Pulse: 86 72 91 (!) 158  Resp: 19 (!) 23 (!) 22 (!) 21  Temp:      TempSrc:      SpO2: 100% 99% 97% 93%  Weight:      Height:        Intake/Output Summary (Last 24 hours) at 06/25/2018 0914 Last data filed at 06/25/2018 0800    Gross per 24 hour  Intake 234.93 ml  Output 600 ml  Net -365.07 ml   Last 3 Weights 06/25/2018 06/24/2018 01/20/2018  Weight (lbs) 178 lb 5.6 oz 175 lb 0.7 oz 175 lb 7.8 oz  Weight (kg) 80.9 kg 79.4 kg 79.6 kg     Body mass index is 32.62 kg/m.  General:  Well nourished, well developed, in no acute distress HEENT: normal Lymph: no adenopathy Neck: no JVD Endocrine:  No thryomegaly Cardiac:  Regular,tachy Lungs:  clear to auscultation bilaterally, no wheezing, rhonchi or rales  Abd: soft, nontender, no hepatomegaly  Ext: no edema Musculoskeletal:  No deformities, BUE and BLE strength normal and equal Skin: warm and dry  Neuro:  CNs  2-12 intact, no focal abnormalities noted Psych:  Normal affect  Laboratory Data:  Chemistry LastLabs      Recent Labs  Lab 06/24/18 1302 06/25/18 0417  NA 137 140  K 4.2 3.9  CL 96* 98  CO2 31 34*  GLUCOSE 375* 178*  BUN 25* 20  CREATININE 0.78 0.51  CALCIUM 9.6 9.4  GFRNONAA >60 >60  GFRAA >60 >60  ANIONGAP 10 8      LastLabs     Recent Labs  Lab 06/24/18 1302  PROT 6.7  ALBUMIN 4.3  AST 19  ALT 20  ALKPHOS 65  BILITOT 0.7     Hematology LastLabs      Recent Labs  Lab 06/24/18 1302 06/25/18 0417  WBC 15.5* 12.1*  RBC 4.63 4.17  HGB 13.0 11.9*  HCT 43.4 39.4  MCV 93.7 94.5  MCH 28.1 28.5  MCHC 30.0 30.2  RDW 15.5 15.3  PLT 291 253     Cardiac Enzymes LastLabs     Recent Labs  Lab 06/24/18 1302  TROPONINI 0.03*      LastLabs  No results for input(s): TROPIPOC in the last 168 hours.    BNP LastLabs     Recent Labs  Lab 06/24/18 1302  BNP 894.0*      DDimer  LastLabs  No results for input(s): DDIMER in the last 168 hours.    Radiology/Studies:  Dg Chest 2 View  Result Date: 06/24/2018 CLINICAL DATA:  Cough and shortness of breath EXAM: CHEST - 2 VIEW COMPARISON:  None. FINDINGS: The heart size and mediastinal contours are within normal limits. Left basilar atelectasis. The visualized skeletal structures are unremarkable. IMPRESSION: No active cardiopulmonary disease. Electronically Signed   By: Ulyses Jarred M.D.   On: 06/24/2018 14:02    Assessment and Plan:   1. Aflutter with RVR - had missed a dose of xarelto within last 3 weeks, thus could not cardiovert in ER - Has been on metropolol 50mg  bid at home and dilt 240 along with xarelto - on cardizem gtt for rate control, remains elevated. REmains on lopressor 50mg  bid and xarelto - rates remain elevated in 150s on 15 of cardizem - will start amiodarone gtt today with hopes it will convert her, if not would have to consider TEE/DCCV. In setting  of COVID-19 risks would try best to avoid TEE if possible but may have to consider. Make npo tonight.  - mildly elevated BNP, CXR is clear without overt signs of failure on exam, breathing at baseline. Likely some mild failure related to her sustained tachycardia, follow with better arrhythmia control   Carlyle Dolly MD

## 2018-06-28 ENCOUNTER — Other Ambulatory Visit: Payer: Self-pay

## 2018-06-28 DIAGNOSIS — J441 Chronic obstructive pulmonary disease with (acute) exacerbation: Secondary | ICD-10-CM

## 2018-06-28 DIAGNOSIS — J9611 Chronic respiratory failure with hypoxia: Secondary | ICD-10-CM

## 2018-06-28 DIAGNOSIS — I4891 Unspecified atrial fibrillation: Secondary | ICD-10-CM

## 2018-06-28 LAB — GLUCOSE, CAPILLARY
Glucose-Capillary: 122 mg/dL — ABNORMAL HIGH (ref 70–99)
Glucose-Capillary: 321 mg/dL — ABNORMAL HIGH (ref 70–99)

## 2018-06-28 MED ORDER — AMIODARONE HCL 200 MG PO TABS: ORAL_TABLET | ORAL | 0 refills | Status: DC

## 2018-06-28 NOTE — Progress Notes (Signed)
Discharge instructions given to pt. Pt verbalizes understanding and states she has no other questions. Daughter Zylpha Poynor) is picking up pt.

## 2018-06-28 NOTE — TOC Transition Note (Signed)
Transition of Care [facility]) - CM/SW Discharge Note   Patient Details  Name: Eileen [name] MRN: [id number] Date of Birth: 05-06-1945  [facility] of [facility]) CM/SW Contact:  Shade Flood, LCSW Phone Number: 06/28/2018, 11:44 AM   Clinical Narrative:     Pt stable for dc today per MD who recommends [facility] RN at dc. Met with pt today to discuss. Pt agreeable to [facility]. She states she has had [place] previously and she would like them again. Will update [facility] Care Management of pt's dc so they can follow up as well. Pt does not feel that she has any other TOC needs for dc.  Final next level of care: [place] Barriers to Discharge: No Barriers Identified   Patient Goals and CMS Choice Patient states their goals for this hospitalization and ongoing recovery are:: rehab and return home CMS Medicare.gov Compare Post Acute Care list provided to:: Patient Choice offered to / list presented to : Patient  Discharge Placement                       Discharge Plan and Services                    HH Arranged: RN [facility] Agency: [place] ([facility])   Social Determinants of Health (SDOH) Interventions     Readmission Risk Interventions Readmission Risk Prevention Plan 06/28/2018  Transportation Screening Complete  Home Care Screening Complete  Some recent data might be hidden

## 2018-06-28 NOTE — Progress Notes (Signed)
Patient feels well this AM. S/p TEE/DCCV yesterday with succesful conversion to SR. Remains in SR today by tele review. Would plan for amio oral 200mg  bid x 3 weeks then 200mg  daily. Continue her home lopressor, we have discontinued her home diltiazem at this time. Continue xarelto, ok for discharge from cardiac standpoint. Would recommend TTE in few months after COVID-19 limitations to better evaluate her MR and TR. Monitor respiraotry status on amio as she does have COPD, she failed aggressive attempts at rate control. We were trying to avoid doing a TEE in setting of COVID-19 restrictions (she had missed some doses of xarelto) and thus we had a lower threshold for using amio to try to chemically convert her but this alone failed and she ultimately required TEE/DCCV. If maintains SR could consider coming off amio in the future.   [facility] will sign off.   Medication Recommendations:  amio 200mg  bid x 3 weeks, then 200mg  daily. Do not restart home diltiazem Other recommendations (labs, testing, etc):  none Follow up as an outpatient:  We will arrange f/u 2 weeks   Zandra Abts MD

## 2018-06-28 NOTE — Discharge Summary (Addendum)
Physician Discharge Summary  Eileen [name] HCW:[id number] DOB: 06-26-1945 DOA: 06/24/2018  PCP: Lavella Lemons, PA  Admit date: 06/24/2018 Discharge date: 06/28/2018  Admitted From: Home Disposition: Home  Recommendations for Outpatient Follow-up:  1. Follow up with PCP in 1-2 weeks 2. Please obtain BMP/CBC in one week 3. Follow-up with cardiology in the next 2 weeks  Home Health: Home health RN Equipment/Devices:  Discharge Condition: Stable CODE STATUS: Full code Diet recommendation: Heart healthy, carb modified  Brief/Interim Summary: 73 year old female with a history of COPD on home oxygen, diabetes, hypertension, atrial flutter on Xarelto, presents to the hospital with complaints of shortness of breath and fatigue.  She was noted to be in rapid atrial flutter and was admitted to the stepdown unit.  She was initially started on Cardizem infusion, but was eventually transitioned over to an amiodarone infusion.  She was seen by cardiology.  Unfortunately, her atrial flutter did not convert to sinus and she eventually required TEE cardioversion.  Patient tolerated this procedure well.  She has maintained sinus rhythm since procedure.  She is continued on anticoagulation with Xarelto.  She will continue on a course of amiodarone as an outpatient.  Cardiology will follow her up in the next 2 weeks.  Respiratory status is currently at baseline.  She is felt stable for discharge.  Discharge Diagnoses:  Principal Problem:   Atrial fibrillation with RVR ([place]) Active Problems:   COPD (chronic obstructive pulmonary disease) (HCC)   Essential hypertension   Diabetes mellitus (HCC) Chronic respiratory failure with hypoxia.   Discharge Instructions  Discharge Instructions    Diet - low sodium heart healthy   Complete by:  As directed    Increase activity slowly   Complete by:  As directed      Allergies as of 06/28/2018      Reactions   Codeine Other (See Comments)   "jittery"       Medication List    STOP taking these medications   diltiazem 240 MG 24 hr capsule Commonly known as:  CARDIZEM CD   telmisartan-hydrochlorothiazide 80-12.5 MG tablet Commonly known as:  MICARDIS HCT     TAKE these medications   acetaminophen 500 MG tablet Commonly known as:  TYLENOL Take 500 mg by mouth every 8 (eight) hours as needed for mild pain or headache.   amiodarone 200 MG tablet Commonly known as:  PACERONE Take 1 tab po bid for 3 weeks then 1 tab po daily   blood glucose meter kit and supplies Kit Dispense based on patient and insurance preference. Use up to four times daily as directed. (FOR ICD-9 250.00, 250.01).  Check sugar before meals and at night, keep a log.   Emergen-C Immune Pack Take 1 Package by mouth daily.   guaiFENesin 600 MG 12 hr tablet Commonly known as:  MUCINEX Take 1 tablet (600 mg total) by mouth 2 (two) times daily.   meclizine 12.5 MG tablet Commonly known as:  ANTIVERT Take 1 tablet by mouth 3 (three) times daily as needed.   metFORMIN 500 MG 24 hr tablet Commonly known as:  GLUCOPHAGE-XR Take 500 mg by mouth daily.   metoprolol tartrate 50 MG tablet Commonly known as:  LOPRESSOR Take 50 mg by mouth 2 (two) times daily.   OXYGEN Inhale 2.5-3 L into the lungs daily. At night and daily as needed   predniSONE 10 MG tablet Commonly known as:  DELTASONE Take 10 mg by mouth daily. What changed:  Another medication with  the same name was removed. Continue taking this medication, and follow the directions you see here.   albuterol (2.5 MG/3ML) 0.083% nebulizer solution Commonly known as:  PROVENTIL Take 2.5 mg by nebulization every 6 (six) hours as needed for wheezing or shortness of breath.   ProAir HFA 108 (90 Base) MCG/ACT inhaler Generic drug:  albuterol Inhale 2 puffs into the lungs every 6 (six) hours as needed for wheezing or shortness of breath.   psyllium 95 % Pack Commonly known as:  HYDROCIL/METAMUCIL Take 1  packet by mouth daily. What changed:    when to take this  reasons to take this   rivaroxaban 20 MG Tabs tablet Commonly known as:  Xarelto Take 1 tablet (20 mg total) by mouth daily with supper.   Trelegy Ellipta 100-62.5-25 MCG/INH Aepb Generic drug:  Fluticasone-Umeclidin-Vilant Take 1 puff by mouth daily.   Vitamin D (Ergocalciferol) 1.25 MG (50000 UT) Caps capsule Commonly known as:  DRISDOL Take 50,000 Units every 30 (thirty) days by mouth.       Allergies  Allergen Reactions  . Codeine Other (See Comments)    "jittery"    Consultations:  Cardiology   Procedures/Studies: Dg Chest 2 View  Result Date: 06/24/2018 CLINICAL DATA:  Cough and shortness of breath EXAM: CHEST - 2 VIEW COMPARISON:  None. FINDINGS: The heart size and mediastinal contours are within normal limits. Left basilar atelectasis. The visualized skeletal structures are unremarkable. IMPRESSION: No active cardiopulmonary disease. Electronically Signed   By: Ulyses Jarred M.D.   On: 06/24/2018 14:02       Subjective: Shortness of breath is stable.  No palpitations or chest pain.  Discharge Exam: Vitals:   06/28/18 0750 06/28/18 0800 06/28/18 0900 06/28/18 1119  BP:   114/66   Pulse:  93 (!) 103   Resp:      Temp:    98.3 F (36.8 C)  TempSrc:    Oral  SpO2: 93% 97% 94%   Weight:      Height:        General: Pt is alert, awake, not in acute distress Cardiovascular: RRR, S1/S2 +, no rubs, no gallops Respiratory: CTA bilaterally, no wheezing, no rhonchi Abdominal: Soft, NT, ND, bowel sounds + Extremities: no edema, no cyanosis    The results of significant diagnostics from this hospitalization (including imaging, microbiology, ancillary and laboratory) are listed below for reference.     Microbiology: Recent Results (from the past 240 hour(s))  MRSA PCR Screening     Status: None   Collection Time: 06/24/18  4:40 PM  Result Value Ref Range Status   MRSA by PCR NEGATIVE  NEGATIVE Final    Comment:        The GeneXpert MRSA Assay (FDA approved for NASAL specimens only), is one component of a comprehensive MRSA colonization surveillance program. It is not intended to diagnose MRSA infection nor to guide or monitor treatment for MRSA infections. Performed at [facility], [address], [place], [place] [address]      Labs: BNP (last 3 results) Recent Labs    07/17/17 1725 01/20/18 0609 06/24/18 1302  BNP 196.0* 107.0* 604.5*   Basic Metabolic Panel: Recent Labs  Lab 06/24/18 1302 06/25/18 0417 06/26/18 0357  NA 137 140 140  K 4.2 3.9 3.6  CL 96* 98 95*  CO2 31 34* 33*  GLUCOSE 375* 178* 192*  BUN 25* 20 22  CREATININE 0.78 0.51 0.63  CALCIUM 9.6 9.4 9.6  MG  --  2.0 2.0   Liver Function Tests: Recent Labs  Lab 06/24/18 1302  AST 19  ALT 20  ALKPHOS 65  BILITOT 0.7  PROT 6.7  ALBUMIN 4.3   No results for input(s): LIPASE, AMYLASE in the last 168 hours. No results for input(s): AMMONIA in the last 168 hours. CBC: Recent Labs  Lab 06/24/18 1302 06/25/18 0417 06/26/18 0357  WBC 15.5* 12.1* 11.9*  HGB 13.0 11.9* 12.4  HCT 43.4 39.4 41.0  MCV 93.7 94.5 93.8  PLT 291 253 251   Cardiac Enzymes: Recent Labs  Lab 06/24/18 1302  TROPONINI 0.03*   BNP: Invalid input(s): POCBNP CBG: Recent Labs  Lab 06/27/18 1122 06/27/18 1651 06/27/18 2123 06/28/18 0737 06/28/18 1113  GLUCAP 134* 181* 211* 122* 321*   D-Dimer No results for input(s): DDIMER in the last 72 hours. Hgb A1c No results for input(s): HGBA1C in the last 72 hours. Lipid Profile No results for input(s): CHOL, HDL, LDLCALC, TRIG, CHOLHDL, LDLDIRECT in the last 72 hours. Thyroid function studies No results for input(s): TSH, T4TOTAL, T3FREE, THYROIDAB in the last 72 hours.  Invalid input(s): FREET3 Anemia work up No results for input(s): VITAMINB12, FOLATE, FERRITIN, TIBC, IRON, RETICCTPCT in the last 72 hours. Urinalysis    Component Value  Date/Time   COLORURINE YELLOW 06/24/2018 1302   APPEARANCEUR CLEAR 06/24/2018 1302   LABSPEC 1.029 06/24/2018 1302   PHURINE 5.0 06/24/2018 1302   GLUCOSEU >=500 (A) 06/24/2018 1302   HGBUR NEGATIVE 06/24/2018 1302   BILIRUBINUR NEGATIVE 06/24/2018 1302   KETONESUR NEGATIVE 06/24/2018 1302   PROTEINUR NEGATIVE 06/24/2018 1302   UROBILINOGEN 0.2 09/09/2009 1010   NITRITE NEGATIVE 06/24/2018 1302   LEUKOCYTESUR NEGATIVE 06/24/2018 1302   Sepsis Labs Invalid input(s): PROCALCITONIN,  WBC,  LACTICIDVEN Microbiology Recent Results (from the past 240 hour(s))  MRSA PCR Screening     Status: None   Collection Time: 06/24/18  4:40 PM  Result Value Ref Range Status   MRSA by PCR NEGATIVE NEGATIVE Final    Comment:        The GeneXpert MRSA Assay (FDA approved for NASAL specimens only), is one component of a comprehensive MRSA colonization surveillance program. It is not intended to diagnose MRSA infection nor to guide or monitor treatment for MRSA infections. Performed at [facility], [address] [place], [place] [address]      Time coordinating discharge: 68mns  SIGNED:   JKathie Dike MD  [facility] 06/28/2018, 11:45 AM   If 7PM-7AM, please contact night-coverage [email or web address]

## 2018-06-30 DIAGNOSIS — E119 Type 2 diabetes mellitus without complications: Secondary | ICD-10-CM | POA: Diagnosis not present

## 2018-06-30 DIAGNOSIS — Z87891 Personal history of nicotine dependence: Secondary | ICD-10-CM | POA: Diagnosis not present

## 2018-06-30 DIAGNOSIS — J45909 Unspecified asthma, uncomplicated: Secondary | ICD-10-CM | POA: Diagnosis not present

## 2018-06-30 DIAGNOSIS — J449 Chronic obstructive pulmonary disease, unspecified: Secondary | ICD-10-CM | POA: Diagnosis not present

## 2018-06-30 DIAGNOSIS — Z9981 Dependence on supplemental oxygen: Secondary | ICD-10-CM | POA: Diagnosis not present

## 2018-06-30 DIAGNOSIS — I480 Paroxysmal atrial fibrillation: Secondary | ICD-10-CM | POA: Diagnosis not present

## 2018-06-30 DIAGNOSIS — Z7952 Long term (current) use of systemic steroids: Secondary | ICD-10-CM | POA: Diagnosis not present

## 2018-06-30 DIAGNOSIS — Z853 Personal history of malignant neoplasm of breast: Secondary | ICD-10-CM | POA: Diagnosis not present

## 2018-06-30 DIAGNOSIS — Z7901 Long term (current) use of anticoagulants: Secondary | ICD-10-CM | POA: Diagnosis not present

## 2018-06-30 DIAGNOSIS — Z9011 Acquired absence of right breast and nipple: Secondary | ICD-10-CM | POA: Diagnosis not present

## 2018-06-30 DIAGNOSIS — I10 Essential (primary) hypertension: Secondary | ICD-10-CM | POA: Diagnosis not present

## 2018-06-30 DIAGNOSIS — Z7984 Long term (current) use of oral hypoglycemic drugs: Secondary | ICD-10-CM | POA: Diagnosis not present

## 2018-07-01 ENCOUNTER — Other Ambulatory Visit: Payer: Self-pay | Admitting: *Deleted

## 2018-07-01 ENCOUNTER — Encounter (HOSPITAL_COMMUNITY): Payer: Self-pay | Admitting: Cardiology

## 2018-07-01 NOTE — Patient Outreach (Signed)
Referral received from hospital liason, pt hospitalized 4/13-4/17/20 for Atrial flutter, pt with diagnoses COPD on oxygen 24 hours daily, DM type 2, HTN, A-fib, hx GI bleed, pneumonia. Outreach call to pt for screening, primary MD office completes transition of care.  Spoke with pt, HIPAA verified, pt reports she is doing well at home, has cardiology appointment on 07/11/18, home health saw pt over this past weekend, pt reports she checks CBG daily with readings in 190's range and is always elevated when taking prednisone, pt states she will make follow up appointment with primary MD, RN CM faxed barrier letter and today's outreach note to primary MD office/ Aggie Hacker PA, mailed successful outreach letter to pt home with 24 hour nurse line magnet, consent form, COPD action plan.  Medications Reviewed Today    Reviewed by Kassie Mends, RN (Registered Nurse) on 07/01/18 at 74  Med List Status: <None>  Medication Order Taking? Sig Documenting Provider Last Dose Status Informant  acetaminophen (TYLENOL) 500 MG tablet [id number] Yes Take 500 mg by mouth every 8 (eight) hours as needed for mild pain or headache.  Lavella Lemons, PA Taking Active Self  albuterol (PROVENTIL) (2.5 MG/3ML) 0.083% nebulizer solution [id number] Yes Take 2.5 mg by nebulization every 6 (six) hours as needed for wheezing or shortness of breath. [provider] Taking Active Self  amiodarone (PACERONE) 200 MG tablet [id number] Yes Take 1 tab po bid for 3 weeks then 1 tab po daily Kathie Dike, MD Taking Active   blood glucose meter kit and supplies KIT [id number] Yes Dispense based on patient and insurance preference. Use up to four times daily as directed. (FOR ICD-9 250.00, 250.01).  Check sugar before meals and at night, keep a log. Arrien, Jimmy Picket, MD Taking Active Self  guaiFENesin (MUCINEX) 600 MG 12 hr tablet [id number] Yes Take 1 tablet (600 mg total) by mouth 2 (two) times daily. Kathie Dike, MD Taking  Active Self  meclizine (ANTIVERT) 12.5 MG tablet [id number] Yes Take 1 tablet by mouth 3 (three) times daily as needed. [provider] Taking Active Self  metFORMIN (GLUCOPHAGE-XR) 500 MG 24 hr tablet [id number] Yes Take 500 mg by mouth daily. [provider] Taking Active Self  metoprolol tartrate (LOPRESSOR) 50 MG tablet [id number] Yes Take 50 mg by mouth 2 (two) times daily. [provider] Taking Active Self  Multiple Vitamins-Minerals (EMERGEN-C IMMUNE) PACK [id number] Yes Take 1 Package by mouth daily. [provider] Taking Active Self  OXYGEN [id number] Yes Inhale 2.5-3 L into the lungs daily. At night and daily as needed [provider] Taking Active Self  predniSONE (DELTASONE) 10 MG tablet [id number] Yes Take 10 mg by mouth daily. [provider] Taking Active Self           Med Note Aneta Mins Jun 24, 2018  2:35 PM) This is patient's daily dose; patient just finished a 9 day course of 50m on 06/24/18  PROAIR HFA 108 (90 BASE) MCG/ACT inhaler [id number] Inhale 2 puffs into the lungs every 6 (six) hours as needed for wheezing or shortness of breath.  [provider] Taking Active Self  psyllium (HYDROCIL/METAMUCIL) 95 % PACK [id number] Take 1 packet by mouth daily.  Patient taking differently:  Take 1 packet by mouth daily as needed.    JMurlean Iba MD Taking Active Self  rivaroxaban (XARELTO) 20 MG TABS tablet [id number] Take 1 tablet (20 mg total) by mouth daily with  supper. Johnson, Clanford L, MD Taking Active Self  TRELEGY ELLIPTA 100-62.5-25 MCG/INH AEPB [id number] Yes Take 1 puff by mouth daily. [provider] Taking Active Self  Vitamin D, Ergocalciferol, (DRISDOL) 50000 units CAPS capsule [id number] Yes Take 50,000 Units every 30 (thirty) days by mouth. [provider] Taking Active Self  Med List Note (Gravley, Morgan L 09/01/17 1219): Meds verified by pharmacy records.            THN CM Care Plan Problem One     Most Recent Value  Care Plan Problem One  Knowledge deficit related to COPD  Role Documenting the Problem One  Care Management Coordinator  Care Plan for Problem One  Active  THN Long Term Goal   Pt will verbalize improved self care for COPD within 60 days.  THN Long Term Goal Start Date  07/01/18  Interventions for Problem One Long Term Goal  RN CM reviewed plan of care with pt, importance of avoiding sick persons, handwashing, medication reconciliation completed  THN CM Short Term Goal #1   Pt will verbalize COPD action plan/ zones within 30 days  THN CM Short Term Goal #1 Start Date  07/01/18  Interventions for Short Term Goal #1  RN CM reviewed COPD action plan with pt,  RN CM mailed pt copy of COPD action plan  THN CM Short Term Goal #2   Pt will verbalize signs/ symptoms exacerbation of A-Fib/ A-flutter within 30 days.  THN CM Short Term Goal #2 Start Date  07/01/18  Interventions for Short Term Goal #2  RN CM reviewed what A-fib/ A-flutter is and signs/ symptoms to be aware of      Outreach pt for telephone assessment in 3 weeks Reinforce COPD action plan Assess CBG Collaborate with home health as needed    RNC, BSN THN Community Care Coordinator [phone]   

## 2018-07-04 DIAGNOSIS — I480 Paroxysmal atrial fibrillation: Secondary | ICD-10-CM | POA: Diagnosis not present

## 2018-07-04 DIAGNOSIS — E119 Type 2 diabetes mellitus without complications: Secondary | ICD-10-CM | POA: Diagnosis not present

## 2018-07-04 DIAGNOSIS — Z7952 Long term (current) use of systemic steroids: Secondary | ICD-10-CM | POA: Diagnosis not present

## 2018-07-04 DIAGNOSIS — Z9981 Dependence on supplemental oxygen: Secondary | ICD-10-CM | POA: Diagnosis not present

## 2018-07-04 DIAGNOSIS — I10 Essential (primary) hypertension: Secondary | ICD-10-CM | POA: Diagnosis not present

## 2018-07-04 DIAGNOSIS — Z7984 Long term (current) use of oral hypoglycemic drugs: Secondary | ICD-10-CM | POA: Diagnosis not present

## 2018-07-04 DIAGNOSIS — J449 Chronic obstructive pulmonary disease, unspecified: Secondary | ICD-10-CM | POA: Diagnosis not present

## 2018-07-04 DIAGNOSIS — Z7901 Long term (current) use of anticoagulants: Secondary | ICD-10-CM | POA: Diagnosis not present

## 2018-07-04 DIAGNOSIS — Z87891 Personal history of nicotine dependence: Secondary | ICD-10-CM | POA: Diagnosis not present

## 2018-07-04 DIAGNOSIS — Z853 Personal history of malignant neoplasm of breast: Secondary | ICD-10-CM | POA: Diagnosis not present

## 2018-07-04 DIAGNOSIS — Z9011 Acquired absence of right breast and nipple: Secondary | ICD-10-CM | POA: Diagnosis not present

## 2018-07-04 DIAGNOSIS — J45909 Unspecified asthma, uncomplicated: Secondary | ICD-10-CM | POA: Diagnosis not present

## 2018-07-05 ENCOUNTER — Telehealth: Payer: Self-pay | Admitting: Cardiovascular Disease

## 2018-07-05 NOTE — Telephone Encounter (Signed)
Virtual Visit Pre-Appointment Phone Call  "(Name), I am calling you today to discuss your upcoming appointment. We are currently trying to limit exposure to the virus that causes COVID-19 by seeing patients at home rather than in the office."  1. "What is the BEST phone number to call the day of the visit?" - include this in appointment notes  2. Do you have or have access to (through a family member/friend) a smartphone with video capability that we can use for your visit?" a. If yes - list this number in appt notes as cell (if different from BEST phone #) and list the appointment type as a VIDEO visit in appointment notes b. If no - list the appointment type as a PHONE visit in appointment notes  3. Confirm consent - "In the setting of the current Covid19 crisis, you are scheduled for a (phone or video) visit with your provider on (date) at (time).  Just as we do with many in-office visits, in order for you to participate in this visit, we must obtain consent.  If you'd like, I can send this to your mychart (if signed up) or email for you to review.  Otherwise, I can obtain your verbal consent now.  All virtual visits are billed to your insurance company just like a normal visit would be.  By agreeing to a virtual visit, we'd like you to understand that the technology does not allow for your provider to perform an examination, and thus may limit your provider's ability to fully assess your condition. If your provider identifies any concerns that need to be evaluated in person, we will make arrangements to do so.  Finally, though the technology is pretty good, we cannot assure that it will always work on either your or our end, and in the setting of a video visit, we may have to convert it to a phone-only visit.  In either situation, we cannot ensure that we have a secure connection.  Are you willing to proceed?" STAFF: Did the patient verbally acknowledge consent to telehealth visit? Document  YES/NO here: yes  4. Advise patient to be prepared - "Two hours prior to your appointment, go ahead and check your blood pressure, pulse, oxygen saturation, and your weight (if you have the equipment to check those) and write them all down. When your visit starts, your provider will ask you for this information. If you have an Apple Watch or Kardia device, please plan to have heart rate information ready on the day of your appointment. Please have a pen and paper handy nearby the day of the visit as well."  5. Give patient instructions for MyChart download to smartphone OR Doximity/Doxy.me as below if video visit (depending on what platform provider is using)  6. Inform patient they will receive a phone call 15 minutes prior to their appointment time (may be from unknown caller ID) so they should be prepared to answer    TELEPHONE CALL NOTE  Eileen [name] has been deemed a candidate for a follow-up tele-health visit to limit community exposure during the Covid-19 pandemic. I spoke with the patient via phone to ensure availability of phone/video source, confirm preferred email & phone number, and discuss instructions and expectations.  I reminded Eileen [name] to be prepared with any vital sign and/or heart rhythm information that could potentially be obtained via home monitoring, at the time of her visit. I reminded Eileen [name] to expect a phone call prior to  her visit.  Weston Anna 07/05/2018 2:00 PM   INSTRUCTIONS FOR DOWNLOADING THE MYCHART APP TO SMARTPHONE  - The patient must first make sure to have activated MyChart and know their login information - If Apple, go to [employer] and type in MyChart in the search bar and download the app. If Android, ask patient to go to [employer] and type in [place] in the search bar and download the app. The app is free but as with any other app downloads, their phone may require them to verify saved payment information or  Apple/Android password.  - The patient will need to then log into the app with their MyChart username and password, and select [facility] as their healthcare provider to link the account. When it is time for your visit, go to the MyChart app, find appointments, and click Begin Video Visit. Be sure to Select Allow for your device to access the Microphone and Camera for your visit. You will then be connected, and your provider will be with you shortly.  **If they have any issues connecting, or need assistance please contact MyChart service desk [phone])**  **If using a computer, in order to ensure the best quality for their visit they will need to use either of the following Internet Browsers: [employer], or Google Chrome**  IF USING DOXIMITY or DOXY.ME - The patient will receive a link just prior to their visit by text.     FULL LENGTH CONSENT FOR TELE-HEALTH VISIT   I hereby voluntarily request, consent and authorize [facility] and its employed or contracted physicians, physician assistants, nurse practitioners or other licensed health care professionals (the Practitioner), to provide me with telemedicine health care services (the Services") as deemed necessary by the treating Practitioner. I acknowledge and consent to receive the Services by the Practitioner via telemedicine. I understand that the telemedicine visit will involve communicating with the Practitioner through live audiovisual communication technology and the disclosure of certain medical information by electronic transmission. I acknowledge that I have been given the opportunity to request an in-person assessment or other available alternative prior to the telemedicine visit and am voluntarily participating in the telemedicine visit.  I understand that I have the right to withhold or withdraw my consent to the use of telemedicine in the course of my care at any time, without affecting my right to future care  or treatment, and that the Practitioner or I may terminate the telemedicine visit at any time. I understand that I have the right to inspect all information obtained and/or recorded in the course of the telemedicine visit and may receive copies of available information for a reasonable fee.  I understand that some of the potential risks of receiving the Services via telemedicine include:   Delay or interruption in medical evaluation due to technological equipment failure or disruption;  Information transmitted may not be sufficient (e.g. poor resolution of images) to allow for appropriate medical decision making by the Practitioner; and/or   In rare instances, security protocols could fail, causing a breach of personal health information.  Furthermore, I acknowledge that it is my responsibility to provide information about my medical history, conditions and care that is complete and accurate to the best of my ability. I acknowledge that Practitioner's advice, recommendations, and/or decision may be based on factors not within their control, such as incomplete or inaccurate data provided by me or distortions of diagnostic images or specimens that may result from electronic transmissions. I  understand that the practice of medicine is not an exact science and that Practitioner makes no warranties or guarantees regarding treatment outcomes. I acknowledge that I will receive a copy of this consent concurrently upon execution via email to the email address I last provided but may also request a printed copy by calling the office of [facility].    I understand that my insurance will be billed for this visit.   I have read or had this consent read to me.  I understand the contents of this consent, which adequately explains the benefits and risks of the Services being provided via telemedicine.   I have been provided ample opportunity to ask questions regarding this consent and the Services and have had  my questions answered to my satisfaction.  I give my informed consent for the services to be provided through the use of telemedicine in my medical care  By participating in this telemedicine visit I agree to the above.

## 2018-07-07 DIAGNOSIS — Z7984 Long term (current) use of oral hypoglycemic drugs: Secondary | ICD-10-CM | POA: Diagnosis not present

## 2018-07-07 DIAGNOSIS — Z853 Personal history of malignant neoplasm of breast: Secondary | ICD-10-CM | POA: Diagnosis not present

## 2018-07-07 DIAGNOSIS — Z7901 Long term (current) use of anticoagulants: Secondary | ICD-10-CM | POA: Diagnosis not present

## 2018-07-07 DIAGNOSIS — Z7952 Long term (current) use of systemic steroids: Secondary | ICD-10-CM | POA: Diagnosis not present

## 2018-07-07 DIAGNOSIS — J449 Chronic obstructive pulmonary disease, unspecified: Secondary | ICD-10-CM | POA: Diagnosis not present

## 2018-07-07 DIAGNOSIS — I10 Essential (primary) hypertension: Secondary | ICD-10-CM | POA: Diagnosis not present

## 2018-07-07 DIAGNOSIS — Z87891 Personal history of nicotine dependence: Secondary | ICD-10-CM | POA: Diagnosis not present

## 2018-07-07 DIAGNOSIS — J45909 Unspecified asthma, uncomplicated: Secondary | ICD-10-CM | POA: Diagnosis not present

## 2018-07-07 DIAGNOSIS — Z9981 Dependence on supplemental oxygen: Secondary | ICD-10-CM | POA: Diagnosis not present

## 2018-07-07 DIAGNOSIS — E119 Type 2 diabetes mellitus without complications: Secondary | ICD-10-CM | POA: Diagnosis not present

## 2018-07-07 DIAGNOSIS — I480 Paroxysmal atrial fibrillation: Secondary | ICD-10-CM | POA: Diagnosis not present

## 2018-07-07 DIAGNOSIS — Z9011 Acquired absence of right breast and nipple: Secondary | ICD-10-CM | POA: Diagnosis not present

## 2018-07-08 DIAGNOSIS — I480 Paroxysmal atrial fibrillation: Secondary | ICD-10-CM | POA: Diagnosis not present

## 2018-07-08 DIAGNOSIS — I10 Essential (primary) hypertension: Secondary | ICD-10-CM | POA: Diagnosis not present

## 2018-07-08 DIAGNOSIS — J441 Chronic obstructive pulmonary disease with (acute) exacerbation: Secondary | ICD-10-CM | POA: Diagnosis not present

## 2018-07-08 DIAGNOSIS — I4892 Unspecified atrial flutter: Secondary | ICD-10-CM | POA: Diagnosis not present

## 2018-07-10 DIAGNOSIS — J45909 Unspecified asthma, uncomplicated: Secondary | ICD-10-CM | POA: Diagnosis not present

## 2018-07-10 DIAGNOSIS — Z7901 Long term (current) use of anticoagulants: Secondary | ICD-10-CM | POA: Diagnosis not present

## 2018-07-10 DIAGNOSIS — Z87891 Personal history of nicotine dependence: Secondary | ICD-10-CM | POA: Diagnosis not present

## 2018-07-10 DIAGNOSIS — I10 Essential (primary) hypertension: Secondary | ICD-10-CM | POA: Diagnosis not present

## 2018-07-10 DIAGNOSIS — Z9981 Dependence on supplemental oxygen: Secondary | ICD-10-CM | POA: Diagnosis not present

## 2018-07-10 DIAGNOSIS — Z7984 Long term (current) use of oral hypoglycemic drugs: Secondary | ICD-10-CM | POA: Diagnosis not present

## 2018-07-10 DIAGNOSIS — Z853 Personal history of malignant neoplasm of breast: Secondary | ICD-10-CM | POA: Diagnosis not present

## 2018-07-10 DIAGNOSIS — J449 Chronic obstructive pulmonary disease, unspecified: Secondary | ICD-10-CM | POA: Diagnosis not present

## 2018-07-10 DIAGNOSIS — E119 Type 2 diabetes mellitus without complications: Secondary | ICD-10-CM | POA: Diagnosis not present

## 2018-07-10 DIAGNOSIS — Z9011 Acquired absence of right breast and nipple: Secondary | ICD-10-CM | POA: Diagnosis not present

## 2018-07-10 DIAGNOSIS — R0902 Hypoxemia: Secondary | ICD-10-CM | POA: Diagnosis not present

## 2018-07-10 DIAGNOSIS — Z7952 Long term (current) use of systemic steroids: Secondary | ICD-10-CM | POA: Diagnosis not present

## 2018-07-10 DIAGNOSIS — I480 Paroxysmal atrial fibrillation: Secondary | ICD-10-CM | POA: Diagnosis not present

## 2018-07-11 ENCOUNTER — Encounter: Payer: Self-pay | Admitting: Student

## 2018-07-11 ENCOUNTER — Telehealth (INDEPENDENT_AMBULATORY_CARE_PROVIDER_SITE_OTHER): Payer: Medicare Other | Admitting: Student

## 2018-07-11 VITALS — HR 83

## 2018-07-11 DIAGNOSIS — I4892 Unspecified atrial flutter: Secondary | ICD-10-CM

## 2018-07-11 DIAGNOSIS — Z7901 Long term (current) use of anticoagulants: Secondary | ICD-10-CM

## 2018-07-11 DIAGNOSIS — I10 Essential (primary) hypertension: Secondary | ICD-10-CM

## 2018-07-11 DIAGNOSIS — Z7189 Other specified counseling: Secondary | ICD-10-CM

## 2018-07-11 DIAGNOSIS — I340 Nonrheumatic mitral (valve) insufficiency: Secondary | ICD-10-CM

## 2018-07-11 DIAGNOSIS — J9611 Chronic respiratory failure with hypoxia: Secondary | ICD-10-CM

## 2018-07-11 MED ORDER — AMIODARONE HCL 200 MG PO TABS: 200.0000 mg | ORAL_TABLET | Freq: Every day | ORAL | 3 refills | Status: DC

## 2018-07-11 NOTE — Patient Instructions (Signed)
Medication Instructions:  Your physician has recommended you make the following change in your medication:   Decrease Amiodarone to 200 mg Daily on 07/20/2018   Labwork: NONE  Testing/Procedures: NONE  Follow-Up: Your physician wants you to follow-up in: 6 Months with Dr. Bronson Ing. You will receive a reminder letter in the mail two months in advance. If you don't receive a letter, please call our office to schedule the follow-up appointment.   Any Other Special Instructions Will Be Listed Below (If Applicable).     If you need a refill on your cardiac medications before your next appointment, please call your pharmacy.  Thank you for choosing [facility]!

## 2018-07-11 NOTE — Progress Notes (Signed)
Virtual Visit via Video Note   This visit type was conducted due to national recommendations for restrictions regarding the COVID-19 Pandemic (e.g. social distancing) in an effort to limit this patient's exposure and mitigate transmission in our community.  Due to her co-morbid illnesses, this patient is at least at moderate risk for complications without adequate follow up.  This format is felt to be most appropriate for this patient at this time.  All issues noted in this document were discussed and addressed.  A limited physical exam was performed with this format.  Please refer to the patient's chart for her consent to telehealth for [facility].   Evaluation Performed:  Follow-up visit  Date:  07/11/2018   ID:  Eileen [name], DOB Sep 29, 1945, MRN [id number]  Patient Location: Home Provider Location: Home  PCP:  Lavella Lemons, PA  Cardiologist:  Kate Sable, MD  Electrophysiologist:  None   Chief Complaint: Hospital Follow-up  History of Present Illness:    Eileen [name] is a 73 y.o. female with past medical history of PAF (on Xarelto), HTN, Type 2 DM, and COPD who presets for a hospital follow-up telehealth visit.   She recently presented to [facility] ED on 06/24/2018 for evaluation of worsening fatigue and dyspnea, found to be in atrial flutter with RVR. She had missed a dose of Xarelto, therefore DCCV was not pursued while in the ED and she was admitted for further management. Was started on IV Cardizem and eventually required IV Amiodarone as well. She underwent TEE-guided DCCV on 06/27/2018 by Dr. Harl Bowie with successful return to NSR with 200J shock. She was maintaining NSR the following morning and it was recommended to continue Amiodarone 236m BID for 3 weeks then reduce to 2012mdaily. Was continued on PTA Lopressor and Xarelto with Cardizem being discontinued. It was recommended she have a repeat TTE once COVID-19 situation improved to better evaluate her MR and  TR.    In talking with the patient today, she reports overall doing well since her recent admission. Says she was unaware of her arrhythmia in regards to palpitations but has not experienced any symptoms since discharge. HR has been in the 70's to 80's when checked on her pulse oximeter. Has baseline dyspnea on exertion in the setting of COPD but this has been stable (on 3L [place] at baseline). No specific chest pain, orthopnea, PND, or lower extremity edema.   Reports good compliance with her current medication regimen, including Xarelto for anticoagulation. Denies any recent melena, hematochezia, or hematuria.   The patient does not have symptoms concerning for COVID-19 infection (fever, chills, cough, or new shortness of breath).    Past Medical History:  Diagnosis Date   Asthma    Atrial flutter ([place]   Cancer ([place]   Right breast   COPD (chronic obstructive pulmonary disease) ([facility]   History of breast cancer    right breast   Hypertension    On home O2    Type 2 diabetes mellitus ([place]   Past Surgical History:  Procedure Laterality Date   BREAST SURGERY  2011   Right breast mastectomy   CARDIOVERSION N/A 06/27/2018   Procedure: CARDIOVERSION;  Surgeon: BrArnoldo LenisMD;  [facility];  Service: Endoscopy;  Laterality: N/A;   CESAREAN SECTION     COLONOSCOPY N/A 01/22/2018   Procedure: COLONOSCOPY;  Surgeon: ReRogene HoustonMD;  [facility];  Service: Endoscopy;  Laterality: N/A;  HERNIA REPAIR     RIH   MASTECTOMY  2011   right breast   TEE WITHOUT CARDIOVERSION N/A 06/27/2018   Procedure: TRANSESOPHAGEAL ECHOCARDIOGRAM (TEE) WITH PROPOFOL;  Surgeon: Arnoldo Lenis, MD;  Location: [facility];  Service: Endoscopy;  Laterality: N/A;     Current Meds  Medication Sig   acetaminophen (TYLENOL) 500 MG tablet Take 500 mg by mouth every 8 (eight) hours as needed for mild pain or headache.    albuterol (PROVENTIL) (2.5  MG/3ML) 0.083% nebulizer solution Take 2.5 mg by nebulization every 6 (six) hours as needed for wheezing or shortness of breath.   [START ON 07/20/2018] amiodarone (PACERONE) 200 MG tablet Take 1 tablet (200 mg total) by mouth daily.   blood glucose meter kit and supplies KIT Dispense based on patient and insurance preference. Use up to four times daily as directed. (FOR ICD-9 250.00, 250.01).  Check sugar before meals and at night, keep a log.   furosemide (LASIX) 40 MG tablet    guaiFENesin (MUCINEX) 600 MG 12 hr tablet Take 1 tablet (600 mg total) by mouth 2 (two) times daily.   meclizine (ANTIVERT) 12.5 MG tablet Take 1 tablet by mouth 3 (three) times daily as needed.   metFORMIN (GLUCOPHAGE-XR) 500 MG 24 hr tablet Take 500 mg by mouth daily.   metoprolol tartrate (LOPRESSOR) 50 MG tablet Take 50 mg by mouth 2 (two) times daily.   Multiple Vitamins-Minerals (EMERGEN-C IMMUNE) PACK Take 1 Package by mouth daily.   OXYGEN Inhale 2.5-3 L into the lungs daily. At night and daily as needed   predniSONE (DELTASONE) 10 MG tablet Take 10 mg by mouth daily.   PROAIR HFA 108 (90 BASE) MCG/ACT inhaler Inhale 2 puffs into the lungs every 6 (six) hours as needed for wheezing or shortness of breath.    psyllium (HYDROCIL/METAMUCIL) 95 % PACK Take 1 packet by mouth daily. (Patient taking differently: Take 1 packet by mouth daily as needed. )   rivaroxaban (XARELTO) 20 MG TABS tablet Take 1 tablet (20 mg total) by mouth daily with supper.   TRELEGY ELLIPTA 100-62.5-25 MCG/INH AEPB Take 1 puff by mouth daily.   Vitamin D, Ergocalciferol, (DRISDOL) 50000 units CAPS capsule Take 50,000 Units every 30 (thirty) days by mouth.   [DISCONTINUED] amiodarone (PACERONE) 200 MG tablet Take 1 tab po bid for 3 weeks then 1 tab po daily     Allergies:   Codeine   Social History   Tobacco Use   Smoking status: Former Smoker    Packs/day: 0.50    Years: 32.00    Pack years: 16.00    Types: Cigarettes     Start date: 12/12/1962    Last attempt to quit: 03/13/1994    Years since quitting: 24.3   Smokeless tobacco: Never Used  Substance Use Topics   Alcohol use: No    Alcohol/week: 0.0 standard drinks   Drug use: No     Family Hx: The patient's family history includes Cancer in her mother; Diabetes in her brother.  ROS:   Please see the history of present illness.     All other systems reviewed and are negative.   Prior CV studies:   The following studies were reviewed today:  Echocardiogram: 03/2015 Study Conclusions  - Left ventricle: The cavity size was normal. Wall thickness was   normal. Systolic function was normal. The estimated ejection   fraction was in the range of 60% to 65%. Wall motion was normal;  there were no regional wall motion abnormalities. The study was   not technically sufficient to allow evaluation of LV diastolic   dysfunction due to atrial fibrillation. - Mitral valve: There was mild regurgitation. - Tricuspid valve: There was mild-moderate regurgitation. - Pulmonary arteries: Systolic pressure was mildly to moderately   increased. PA peak pressure: 45 mm Hg (S). - Inferior vena cava: The vessel was dilated. The respirophasic   diameter changes were blunted (< 50%), consistent with elevated   central venous pressure.  TEE: 06/2018 IMPRESSIONS   1. Left atrial size was moderately dilated.  2. No evidence of a thrombus present in the left atrial appendage.  3. Right atrial size was moderately dilated.  4. Mitral valve regurgitation is moderate by color flow Doppler. No evidence of mitral valve stenosis.  5. The tricuspid valve was not well visualized. Tricuspid valve regurgitation is moderate.  6. The aortic valve is tricuspid Aortic valve regurgitation is mild by color flow Doppler. No stenosis of the aortic valve.  7. Limited focused study due to hypoxia during study.  8. The left ventricle has low normal systolic function, with an  ejection fraction of 50-55%.  9. Would recommend TTE to better evaluate MR and TR after COVID-19 limitations have passed, would also be helpful to evaluate LVEF with aflutter resolved. Limited images taken during this study due to hypoxia during study, MR and TR appear at least  moderate.    Labs/Other Tests and Data Reviewed:    EKG:  An ECG dated 06/27/2018 was personally reviewed today and demonstrated:  NSR, HR 93. No acute ST changes.  Recent Labs: 06/24/2018: ALT 20; B Natriuretic Peptide 894.0; TSH 2.605 06/26/2018: BUN 22; Creatinine, Ser 0.63; Hemoglobin 12.4; Magnesium 2.0; Platelets 251; Potassium 3.6; Sodium 140   Recent Lipid Panel Lab Results  Component Value Date/Time   CHOL 204 (H) 03/23/2015 04:20 AM   TRIG 86 03/23/2015 04:20 AM   HDL 85 03/23/2015 04:20 AM   CHOLHDL 2.4 03/23/2015 04:20 AM   LDLCALC 102 (H) 03/23/2015 04:20 AM    Wt Readings from Last 3 Encounters:  06/28/18 175 lb 11.3 oz (79.7 kg)  01/20/18 175 lb 7.8 oz (79.6 kg)  11/19/17 172 lb (78 kg)     Objective:    Vital Signs:  Pulse 83    SpO2 96%    General: Pleasant, African American female appearing in NAD Psych: Normal affect. Neuro: Alert and oriented X 3. Moves all extremities spontaneously. HEENT: Normal  Lungs:  Resp appear regular and unlabored by video examination.  ASSESSMENT & PLAN:    1. Paroxysmal Atrial Flutter/ Chronic Anticoagulation - she has a history of atrial fibrillation and atrial flutter and was recently admitted for atrial flutter with RVR earlier this month requiring TEE-guided DCCV due to missing doses of anticoagulation PTA.  - she reports overall doing well since returning home. HR has been in the 70's to 80's. She is currently on Amiodarone 240m BID with plans to reduce this to 20105mdaily next week. Hopefully can be discontinued in the future if no recurrence given her underlying COPD. TSH and LFT's WNL during admission. Remains on Lopressor 5056mID.  - she  denies any evidence of active bleeding. Continue Xarelto 53m44mily for anticoagulation.   2. HTN - the patient does not have a BP cuff at home but this was well-controlled during her recent admission. Remains on Lopressor 50mg27m.   3. Mitral Regurgitation - felt to be moderate by  TEE during admission with outpatient TTE recommended in the future for further evaluation due to TEE being a limited study secondary to desaturations. Would plan to arrange once COVID-19 situation has improved. Reviewed with the patient today the reasoning behind obtaining the study and she is in agreement once safe to do so.    4. COPD - on 3L [place] at baseline. No recent change in her symptoms.Saturations in the low to mid-90's when checked at home.   5. COVID-19 Education: - The signs and symptoms of COVID-19 were discussed with the patient. The importance of social distancing was discussed today.   Time:   Today, I have spent 19 minutes with the patient with telehealth technology discussing the above problems.     Medication Adjustments/Labs and Tests Ordered: Current medicines are reviewed at length with the patient today.  Concerns regarding medicines are outlined above.   Tests Ordered: No orders of the defined types were placed in this encounter.   Medication Changes: Meds ordered this encounter  Medications   amiodarone (PACERONE) 200 MG tablet    Sig: Take 1 tablet (200 mg total) by mouth daily.    Dispense:  90 tablet    Refill:  3    Order Specific Question:   Supervising Provider    Answer:   Dorothy Spark [[id number]]    Disposition:  Follow up with Dr. Bronson Ing in 5-6 months.   Signed, Erma Heritage, PA-C  07/11/2018 4:46 PM    [facility]

## 2018-07-12 DIAGNOSIS — Z87891 Personal history of nicotine dependence: Secondary | ICD-10-CM | POA: Diagnosis not present

## 2018-07-12 DIAGNOSIS — I10 Essential (primary) hypertension: Secondary | ICD-10-CM | POA: Diagnosis not present

## 2018-07-12 DIAGNOSIS — E119 Type 2 diabetes mellitus without complications: Secondary | ICD-10-CM | POA: Diagnosis not present

## 2018-07-12 DIAGNOSIS — Z7952 Long term (current) use of systemic steroids: Secondary | ICD-10-CM | POA: Diagnosis not present

## 2018-07-12 DIAGNOSIS — Z9981 Dependence on supplemental oxygen: Secondary | ICD-10-CM | POA: Diagnosis not present

## 2018-07-12 DIAGNOSIS — Z9011 Acquired absence of right breast and nipple: Secondary | ICD-10-CM | POA: Diagnosis not present

## 2018-07-12 DIAGNOSIS — J45909 Unspecified asthma, uncomplicated: Secondary | ICD-10-CM | POA: Diagnosis not present

## 2018-07-12 DIAGNOSIS — Z7901 Long term (current) use of anticoagulants: Secondary | ICD-10-CM | POA: Diagnosis not present

## 2018-07-12 DIAGNOSIS — Z7984 Long term (current) use of oral hypoglycemic drugs: Secondary | ICD-10-CM | POA: Diagnosis not present

## 2018-07-12 DIAGNOSIS — J449 Chronic obstructive pulmonary disease, unspecified: Secondary | ICD-10-CM | POA: Diagnosis not present

## 2018-07-12 DIAGNOSIS — Z853 Personal history of malignant neoplasm of breast: Secondary | ICD-10-CM | POA: Diagnosis not present

## 2018-07-12 DIAGNOSIS — I480 Paroxysmal atrial fibrillation: Secondary | ICD-10-CM | POA: Diagnosis not present

## 2018-07-15 DIAGNOSIS — J45909 Unspecified asthma, uncomplicated: Secondary | ICD-10-CM | POA: Diagnosis not present

## 2018-07-15 DIAGNOSIS — Z9011 Acquired absence of right breast and nipple: Secondary | ICD-10-CM | POA: Diagnosis not present

## 2018-07-15 DIAGNOSIS — Z87891 Personal history of nicotine dependence: Secondary | ICD-10-CM | POA: Diagnosis not present

## 2018-07-15 DIAGNOSIS — J449 Chronic obstructive pulmonary disease, unspecified: Secondary | ICD-10-CM | POA: Diagnosis not present

## 2018-07-15 DIAGNOSIS — Z853 Personal history of malignant neoplasm of breast: Secondary | ICD-10-CM | POA: Diagnosis not present

## 2018-07-15 DIAGNOSIS — I480 Paroxysmal atrial fibrillation: Secondary | ICD-10-CM | POA: Diagnosis not present

## 2018-07-15 DIAGNOSIS — I10 Essential (primary) hypertension: Secondary | ICD-10-CM | POA: Diagnosis not present

## 2018-07-15 DIAGNOSIS — Z9981 Dependence on supplemental oxygen: Secondary | ICD-10-CM | POA: Diagnosis not present

## 2018-07-15 DIAGNOSIS — E119 Type 2 diabetes mellitus without complications: Secondary | ICD-10-CM | POA: Diagnosis not present

## 2018-07-15 DIAGNOSIS — Z7952 Long term (current) use of systemic steroids: Secondary | ICD-10-CM | POA: Diagnosis not present

## 2018-07-15 DIAGNOSIS — Z7901 Long term (current) use of anticoagulants: Secondary | ICD-10-CM | POA: Diagnosis not present

## 2018-07-15 DIAGNOSIS — Z7984 Long term (current) use of oral hypoglycemic drugs: Secondary | ICD-10-CM | POA: Diagnosis not present

## 2018-07-16 DIAGNOSIS — J45909 Unspecified asthma, uncomplicated: Secondary | ICD-10-CM | POA: Diagnosis not present

## 2018-07-16 DIAGNOSIS — E119 Type 2 diabetes mellitus without complications: Secondary | ICD-10-CM | POA: Diagnosis not present

## 2018-07-16 DIAGNOSIS — J449 Chronic obstructive pulmonary disease, unspecified: Secondary | ICD-10-CM | POA: Diagnosis not present

## 2018-07-16 DIAGNOSIS — J9611 Chronic respiratory failure with hypoxia: Secondary | ICD-10-CM | POA: Diagnosis not present

## 2018-07-16 DIAGNOSIS — I10 Essential (primary) hypertension: Secondary | ICD-10-CM | POA: Diagnosis not present

## 2018-07-16 DIAGNOSIS — I5032 Chronic diastolic (congestive) heart failure: Secondary | ICD-10-CM | POA: Diagnosis not present

## 2018-07-16 DIAGNOSIS — I480 Paroxysmal atrial fibrillation: Secondary | ICD-10-CM | POA: Diagnosis not present

## 2018-07-16 DIAGNOSIS — I4891 Unspecified atrial fibrillation: Secondary | ICD-10-CM | POA: Diagnosis not present

## 2018-07-18 DIAGNOSIS — Z87891 Personal history of nicotine dependence: Secondary | ICD-10-CM | POA: Diagnosis not present

## 2018-07-18 DIAGNOSIS — J449 Chronic obstructive pulmonary disease, unspecified: Secondary | ICD-10-CM | POA: Diagnosis not present

## 2018-07-18 DIAGNOSIS — I10 Essential (primary) hypertension: Secondary | ICD-10-CM | POA: Diagnosis not present

## 2018-07-18 DIAGNOSIS — Z9981 Dependence on supplemental oxygen: Secondary | ICD-10-CM | POA: Diagnosis not present

## 2018-07-18 DIAGNOSIS — I480 Paroxysmal atrial fibrillation: Secondary | ICD-10-CM | POA: Diagnosis not present

## 2018-07-18 DIAGNOSIS — Z7984 Long term (current) use of oral hypoglycemic drugs: Secondary | ICD-10-CM | POA: Diagnosis not present

## 2018-07-18 DIAGNOSIS — J45909 Unspecified asthma, uncomplicated: Secondary | ICD-10-CM | POA: Diagnosis not present

## 2018-07-18 DIAGNOSIS — Z7901 Long term (current) use of anticoagulants: Secondary | ICD-10-CM | POA: Diagnosis not present

## 2018-07-18 DIAGNOSIS — Z9011 Acquired absence of right breast and nipple: Secondary | ICD-10-CM | POA: Diagnosis not present

## 2018-07-18 DIAGNOSIS — Z7952 Long term (current) use of systemic steroids: Secondary | ICD-10-CM | POA: Diagnosis not present

## 2018-07-18 DIAGNOSIS — Z853 Personal history of malignant neoplasm of breast: Secondary | ICD-10-CM | POA: Diagnosis not present

## 2018-07-18 DIAGNOSIS — E119 Type 2 diabetes mellitus without complications: Secondary | ICD-10-CM | POA: Diagnosis not present

## 2018-07-22 ENCOUNTER — Encounter: Payer: Self-pay | Admitting: *Deleted

## 2018-07-22 ENCOUNTER — Other Ambulatory Visit: Payer: Self-pay | Admitting: *Deleted

## 2018-07-22 NOTE — Patient Outreach (Signed)
Outreach call to pt for telephone assessment, spoke with pt, HIPAA verified, pt reports she has all medications and taking as prescribed, was placed on antibiotic and titrated prednisone after seeing pulmonologist last week " to make sure everything is cleared up"  Pt to follow up with pulmonologist next week, pt reports most CBG readings below 200 with occasional reading above 200 and states " it's always worse with all this prednisone"  Pt reports " I'm doing better and breathing is ok, sometimes I get short of breath".    THN CM Care Plan Problem One     Most Recent Value  Care Plan Problem One  Knowledge deficit related to COPD  Role Documenting the Problem One  Care Management Coordinator  Care Plan for Problem One  Active  THN Long Term Goal   Pt will verbalize improved self care for COPD within 60 days.  THN Long Term Goal Start Date  07/01/18  Interventions for Problem One Long Term Goal  RN CM reviewed plan of care with pt, reviewed all upcoming MD appointments, pt followed up with primary care 2 weeks ago and saw pulmonologist Dr. Luan Pulling last week and follow up in 2 weeks.  THN CM Short Term Goal #1   Pt will verbalize COPD action plan/ zones within 30 days  THN CM Short Term Goal #1 Start Date  07/22/18 [goal re-established]  Interventions for Short Term Goal #1  RN CM reinforced COPD action plan with emphasis on yellow zone, pt continues to use oxygen at 3 liters/ 24 hours daily  THN CM Short Term Goal #2   Pt will verbalize signs/ symptoms exacerbation of A-Fib/ A-flutter within 30 days.  THN CM Short Term Goal #2 Start Date  07/22/18 Barrie Folk re-established]  Interventions for Short Term Goal #2  RN CM reinforced signs/ symptoms exacerbation A-fib/ A-flutter, importance of reporting changes to MD      PLAN Outreach pt for telephone assessment next month  Jacqlyn Larsen [facility], [place] Coordinator [phone]

## 2018-07-28 DIAGNOSIS — J441 Chronic obstructive pulmonary disease with (acute) exacerbation: Secondary | ICD-10-CM | POA: Diagnosis not present

## 2018-07-28 DIAGNOSIS — I10 Essential (primary) hypertension: Secondary | ICD-10-CM | POA: Diagnosis not present

## 2018-07-28 DIAGNOSIS — I480 Paroxysmal atrial fibrillation: Secondary | ICD-10-CM | POA: Diagnosis not present

## 2018-07-28 DIAGNOSIS — I4892 Unspecified atrial flutter: Secondary | ICD-10-CM | POA: Diagnosis not present

## 2018-07-30 DIAGNOSIS — R1312 Dysphagia, oropharyngeal phase: Secondary | ICD-10-CM | POA: Diagnosis not present

## 2018-07-30 DIAGNOSIS — J449 Chronic obstructive pulmonary disease, unspecified: Secondary | ICD-10-CM | POA: Diagnosis not present

## 2018-07-30 DIAGNOSIS — I5032 Chronic diastolic (congestive) heart failure: Secondary | ICD-10-CM | POA: Diagnosis not present

## 2018-07-30 DIAGNOSIS — J9611 Chronic respiratory failure with hypoxia: Secondary | ICD-10-CM | POA: Diagnosis not present

## 2018-08-09 DIAGNOSIS — J449 Chronic obstructive pulmonary disease, unspecified: Secondary | ICD-10-CM | POA: Diagnosis not present

## 2018-08-09 DIAGNOSIS — R0902 Hypoxemia: Secondary | ICD-10-CM | POA: Diagnosis not present

## 2018-08-10 ENCOUNTER — Inpatient Hospital Stay (HOSPITAL_COMMUNITY)
Admission: EM | Admit: 2018-08-10 | Discharge: 2018-08-19 | DRG: 193 | Disposition: A | Discharge status: Home Health | Payer: Medicare Other | Attending: Internal Medicine | Admitting: Internal Medicine

## 2018-08-10 ENCOUNTER — Other Ambulatory Visit: Payer: Self-pay

## 2018-08-10 ENCOUNTER — Encounter (HOSPITAL_COMMUNITY): Payer: Self-pay | Admitting: Emergency Medicine

## 2018-08-10 ENCOUNTER — Emergency Department (HOSPITAL_COMMUNITY): Payer: Medicare Other

## 2018-08-10 DIAGNOSIS — I10 Essential (primary) hypertension: Secondary | ICD-10-CM

## 2018-08-10 DIAGNOSIS — Y95 Nosocomial condition: Secondary | ICD-10-CM | POA: Diagnosis present

## 2018-08-10 DIAGNOSIS — J9620 Acute and chronic respiratory failure, unspecified whether with hypoxia or hypercapnia: Secondary | ICD-10-CM | POA: Diagnosis not present

## 2018-08-10 DIAGNOSIS — E1169 Type 2 diabetes mellitus with other specified complication: Secondary | ICD-10-CM | POA: Diagnosis not present

## 2018-08-10 DIAGNOSIS — Z853 Personal history of malignant neoplasm of breast: Secondary | ICD-10-CM

## 2018-08-10 DIAGNOSIS — J189 Pneumonia, unspecified organism: Principal | ICD-10-CM | POA: Diagnosis present

## 2018-08-10 DIAGNOSIS — I4892 Unspecified atrial flutter: Secondary | ICD-10-CM | POA: Diagnosis present

## 2018-08-10 DIAGNOSIS — Z833 Family history of diabetes mellitus: Secondary | ICD-10-CM

## 2018-08-10 DIAGNOSIS — Z79899 Other long term (current) drug therapy: Secondary | ICD-10-CM | POA: Diagnosis not present

## 2018-08-10 DIAGNOSIS — R140 Abdominal distension (gaseous): Secondary | ICD-10-CM | POA: Diagnosis not present

## 2018-08-10 DIAGNOSIS — D72829 Elevated white blood cell count, unspecified: Secondary | ICD-10-CM | POA: Diagnosis present

## 2018-08-10 DIAGNOSIS — E1165 Type 2 diabetes mellitus with hyperglycemia: Secondary | ICD-10-CM | POA: Diagnosis not present

## 2018-08-10 DIAGNOSIS — Z9011 Acquired absence of right breast and nipple: Secondary | ICD-10-CM

## 2018-08-10 DIAGNOSIS — J449 Chronic obstructive pulmonary disease, unspecified: Secondary | ICD-10-CM | POA: Diagnosis not present

## 2018-08-10 DIAGNOSIS — Z7984 Long term (current) use of oral hypoglycemic drugs: Secondary | ICD-10-CM

## 2018-08-10 DIAGNOSIS — R0602 Shortness of breath: Secondary | ICD-10-CM

## 2018-08-10 DIAGNOSIS — Z7952 Long term (current) use of systemic steroids: Secondary | ICD-10-CM

## 2018-08-10 DIAGNOSIS — J9622 Acute and chronic respiratory failure with hypercapnia: Secondary | ICD-10-CM | POA: Diagnosis present

## 2018-08-10 DIAGNOSIS — J440 Chronic obstructive pulmonary disease with acute lower respiratory infection: Secondary | ICD-10-CM | POA: Diagnosis not present

## 2018-08-10 DIAGNOSIS — Z7901 Long term (current) use of anticoagulants: Secondary | ICD-10-CM

## 2018-08-10 DIAGNOSIS — R410 Disorientation, unspecified: Secondary | ICD-10-CM

## 2018-08-10 DIAGNOSIS — J9621 Acute and chronic respiratory failure with hypoxia: Secondary | ICD-10-CM | POA: Diagnosis present

## 2018-08-10 DIAGNOSIS — R0902 Hypoxemia: Secondary | ICD-10-CM

## 2018-08-10 DIAGNOSIS — J441 Chronic obstructive pulmonary disease with (acute) exacerbation: Secondary | ICD-10-CM | POA: Diagnosis present

## 2018-08-10 DIAGNOSIS — T380X5A Adverse effect of glucocorticoids and synthetic analogues, initial encounter: Secondary | ICD-10-CM | POA: Diagnosis not present

## 2018-08-10 DIAGNOSIS — E11649 Type 2 diabetes mellitus with hypoglycemia without coma: Secondary | ICD-10-CM | POA: Diagnosis not present

## 2018-08-10 DIAGNOSIS — I483 Typical atrial flutter: Secondary | ICD-10-CM | POA: Diagnosis not present

## 2018-08-10 DIAGNOSIS — E872 Acidosis: Secondary | ICD-10-CM | POA: Diagnosis not present

## 2018-08-10 DIAGNOSIS — J9611 Chronic respiratory failure with hypoxia: Secondary | ICD-10-CM | POA: Diagnosis not present

## 2018-08-10 DIAGNOSIS — Z9981 Dependence on supplemental oxygen: Secondary | ICD-10-CM | POA: Diagnosis not present

## 2018-08-10 DIAGNOSIS — Z87891 Personal history of nicotine dependence: Secondary | ICD-10-CM | POA: Diagnosis not present

## 2018-08-10 DIAGNOSIS — Z1159 Encounter for screening for other viral diseases: Secondary | ICD-10-CM

## 2018-08-10 DIAGNOSIS — Z885 Allergy status to narcotic agent status: Secondary | ICD-10-CM

## 2018-08-10 DIAGNOSIS — J181 Lobar pneumonia, unspecified organism: Secondary | ICD-10-CM | POA: Diagnosis not present

## 2018-08-10 DIAGNOSIS — E119 Type 2 diabetes mellitus without complications: Secondary | ICD-10-CM

## 2018-08-10 DIAGNOSIS — M6281 Muscle weakness (generalized): Secondary | ICD-10-CM | POA: Diagnosis not present

## 2018-08-10 LAB — CBC WITH DIFFERENTIAL/PLATELET
Abs Immature Granulocytes: 0.08 10*3/uL — ABNORMAL HIGH (ref 0.00–0.07)
Basophils Absolute: 0 10*3/uL (ref 0.0–0.1)
Basophils Relative: 0 %
Eosinophils Absolute: 0 10*3/uL (ref 0.0–0.5)
Eosinophils Relative: 0 %
HCT: 41.6 % (ref 36.0–46.0)
Hemoglobin: 12.5 g/dL (ref 12.0–15.0)
Immature Granulocytes: 1 %
Lymphocytes Relative: 9 %
Lymphs Abs: 1.2 10*3/uL (ref 0.7–4.0)
MCH: 29.6 pg (ref 26.0–34.0)
MCHC: 30 g/dL (ref 30.0–36.0)
MCV: 98.6 fL (ref 80.0–100.0)
Monocytes Absolute: 0.7 10*3/uL (ref 0.1–1.0)
Monocytes Relative: 5 %
Neutro Abs: 12 10*3/uL — ABNORMAL HIGH (ref 1.7–7.7)
Neutrophils Relative %: 85 %
Platelets: 252 10*3/uL (ref 150–400)
RBC: 4.22 MIL/uL (ref 3.87–5.11)
RDW: 14.4 % (ref 11.5–15.5)
WBC: 14.1 10*3/uL — ABNORMAL HIGH (ref 4.0–10.5)
nRBC: 0.4 % — ABNORMAL HIGH (ref 0.0–0.2)

## 2018-08-10 LAB — GLUCOSE, CAPILLARY
Glucose-Capillary: 319 mg/dL — ABNORMAL HIGH (ref 70–99)
Glucose-Capillary: 243 mg/dL — ABNORMAL HIGH (ref 70–99)

## 2018-08-10 LAB — SARS CORONAVIRUS 2 BY RT PCR (HOSPITAL ORDER, PERFORMED IN ~~LOC~~ HOSPITAL LAB): SARS Coronavirus 2: NEGATIVE

## 2018-08-10 LAB — BASIC METABOLIC PANEL
Anion gap: 14 (ref 5–15)
BUN: 17 mg/dL (ref 8–23)
CO2: 27 mmol/L (ref 22–32)
Calcium: 9.4 mg/dL (ref 8.9–10.3)
Chloride: 99 mmol/L (ref 98–111)
Creatinine, Ser: 0.87 mg/dL (ref 0.44–1.00)
GFR calc Af Amer: >60 mL/min (ref >60)
GFR calc non Af Amer: >60 mL/min (ref >60)
Glucose, Bld: 292 mg/dL — ABNORMAL HIGH (ref 70–99)
Potassium: 4.4 mmol/L (ref 3.5–5.1)
Sodium: 140 mmol/L (ref 135–145)

## 2018-08-10 LAB — TROPONIN I: Troponin I: <0.03 ng/mL (ref <0.03)

## 2018-08-10 LAB — CBG MONITORING, ED: Glucose-Capillary: 222 mg/dL — ABNORMAL HIGH (ref 70–99)

## 2018-08-10 LAB — BRAIN NATRIURETIC PEPTIDE: B Natriuretic Peptide: 726 pg/mL — ABNORMAL HIGH (ref 0.0–100.0)

## 2018-08-10 LAB — MRSA PCR SCREENING: MRSA by PCR: NEGATIVE

## 2018-08-10 MED ORDER — RIVAROXABAN 20 MG PO TABS
20.0000 mg | ORAL_TABLET | Freq: Every day | ORAL | Status: DC
  Administered 2018-08-11 – 2018-08-18 (×7): 20 mg via ORAL
  Filled 2018-08-10 (×8): qty 1

## 2018-08-10 MED ORDER — FUROSEMIDE 20 MG PO TABS
20.0000 mg | ORAL_TABLET | Freq: Every day | ORAL | Status: DC
  Administered 2018-08-12: 09:00:00 20 mg via ORAL
  Filled 2018-08-10 (×2): qty 1

## 2018-08-10 MED ORDER — IPRATROPIUM BROMIDE 0.02 % IN SOLN
1.0000 mg | Freq: Once | RESPIRATORY_TRACT | Status: AC
  Administered 2018-08-10: 1 mg via RESPIRATORY_TRACT
  Filled 2018-08-10: qty 5

## 2018-08-10 MED ORDER — ALBUTEROL (5 MG/ML) CONTINUOUS INHALATION SOLN
10.0000 mg/h | INHALATION_SOLUTION | Freq: Once | RESPIRATORY_TRACT | Status: AC
  Administered 2018-08-10: 10 mg/h via RESPIRATORY_TRACT
  Filled 2018-08-10: qty 20

## 2018-08-10 MED ORDER — METHYLPREDNISOLONE SODIUM SUCC 40 MG IJ SOLR
40.0000 mg | Freq: Four times a day (QID) | INTRAMUSCULAR | Status: DC
  Administered 2018-08-10 – 2018-08-13 (×11): 40 mg via INTRAVENOUS
  Filled 2018-08-10 (×11): qty 1

## 2018-08-10 MED ORDER — VANCOMYCIN HCL 10 G IV SOLR
1500.0000 mg | Freq: Once | INTRAVENOUS | Status: AC
  Administered 2018-08-10: 1500 mg via INTRAVENOUS
  Filled 2018-08-10: qty 1500

## 2018-08-10 MED ORDER — VANCOMYCIN HCL IN DEXTROSE 750-5 MG/150ML-% IV SOLN
750.0000 mg | Freq: Two times a day (BID) | INTRAVENOUS | Status: DC
  Administered 2018-08-11: 750 mg via INTRAVENOUS
  Filled 2018-08-10: qty 150

## 2018-08-10 MED ORDER — SODIUM CHLORIDE 0.9 % IV SOLN
1.0000 g | Freq: Once | INTRAVENOUS | Status: AC
  Administered 2018-08-10: 1 g via INTRAVENOUS
  Filled 2018-08-10: qty 1

## 2018-08-10 MED ORDER — SODIUM CHLORIDE 0.9 % IV SOLN
2.0000 g | Freq: Two times a day (BID) | INTRAVENOUS | Status: DC
  Administered 2018-08-11: 2 g via INTRAVENOUS
  Filled 2018-08-10: qty 2

## 2018-08-10 MED ORDER — FLUTICASONE-UMECLIDIN-VILANT 100-62.5-25 MCG/INH IN AEPB: 1.0000 | INHALATION_SPRAY | Freq: Every day | RESPIRATORY_TRACT | Status: DC

## 2018-08-10 MED ORDER — PNEUMOCOCCAL VAC POLYVALENT 25 MCG/0.5ML IJ INJ: 0.5000 mL | INJECTION | INTRAMUSCULAR | Status: DC

## 2018-08-10 MED ORDER — GUAIFENESIN ER 600 MG PO TB12
1200.0000 mg | ORAL_TABLET | Freq: Two times a day (BID) | ORAL | Status: DC
  Administered 2018-08-10 – 2018-08-19 (×18): 1200 mg via ORAL
  Filled 2018-08-10 (×18): qty 2

## 2018-08-10 MED ORDER — INSULIN ASPART 100 UNIT/ML ~~LOC~~ SOLN
0.0000 [IU] | Freq: Every day | SUBCUTANEOUS | Status: DC
  Administered 2018-08-10 – 2018-08-12 (×3): 2 [IU] via SUBCUTANEOUS

## 2018-08-10 MED ORDER — FLUTICASONE FUROATE-VILANTEROL 100-25 MCG/INH IN AEPB
1.0000 | INHALATION_SPRAY | Freq: Every day | RESPIRATORY_TRACT | Status: DC
  Administered 2018-08-11 – 2018-08-19 (×9): 1 via RESPIRATORY_TRACT
  Filled 2018-08-10: qty 28

## 2018-08-10 MED ORDER — UMECLIDINIUM BROMIDE 62.5 MCG/INH IN AEPB
1.0000 | INHALATION_SPRAY | Freq: Every day | RESPIRATORY_TRACT | Status: DC
  Administered 2018-08-11 – 2018-08-12 (×2): 1 via RESPIRATORY_TRACT
  Filled 2018-08-10: qty 7

## 2018-08-10 MED ORDER — METHYLPREDNISOLONE SODIUM SUCC 125 MG IJ SOLR
125.0000 mg | Freq: Once | INTRAMUSCULAR | Status: AC
  Administered 2018-08-10: 125 mg via INTRAVENOUS
  Filled 2018-08-10: qty 2

## 2018-08-10 MED ORDER — IPRATROPIUM-ALBUTEROL 0.5-2.5 (3) MG/3ML IN SOLN
3.0000 mL | RESPIRATORY_TRACT | Status: DC
  Administered 2018-08-10 – 2018-08-11 (×9): 3 mL via RESPIRATORY_TRACT
  Filled 2018-08-10 (×7): qty 3

## 2018-08-10 MED ORDER — INSULIN ASPART 100 UNIT/ML ~~LOC~~ SOLN
10.0000 [IU] | Freq: Three times a day (TID) | SUBCUTANEOUS | Status: DC
  Administered 2018-08-10: 10 [IU] via SUBCUTANEOUS

## 2018-08-10 MED ORDER — SODIUM CHLORIDE 0.9 % IV SOLN
1.0000 g | Freq: Three times a day (TID) | INTRAVENOUS | Status: DC
  Administered 2018-08-10: 1 g via INTRAVENOUS
  Filled 2018-08-10: qty 1

## 2018-08-10 MED ORDER — ACETAMINOPHEN 325 MG PO TABS
650.0000 mg | ORAL_TABLET | Freq: Four times a day (QID) | ORAL | Status: DC | PRN
  Administered 2018-08-10 – 2018-08-17 (×8): 650 mg via ORAL
  Filled 2018-08-10 (×8): qty 2

## 2018-08-10 MED ORDER — VANCOMYCIN HCL 10 G IV SOLR: 1500.0000 mg | Freq: Once | INTRAVENOUS | Status: DC

## 2018-08-10 MED ORDER — AMIODARONE HCL 200 MG PO TABS
200.0000 mg | ORAL_TABLET | Freq: Every day | ORAL | Status: DC
  Administered 2018-08-11 – 2018-08-19 (×9): 200 mg via ORAL
  Filled 2018-08-10 (×10): qty 1

## 2018-08-10 MED ORDER — INSULIN ASPART 100 UNIT/ML ~~LOC~~ SOLN
0.0000 [IU] | Freq: Three times a day (TID) | SUBCUTANEOUS | Status: DC
  Administered 2018-08-10 – 2018-08-11 (×2): 7 [IU] via SUBCUTANEOUS
  Administered 2018-08-11 – 2018-08-12 (×3): 4 [IU] via SUBCUTANEOUS
  Administered 2018-08-13 (×2): 7 [IU] via SUBCUTANEOUS
  Filled 2018-08-10: qty 1

## 2018-08-10 MED ORDER — MECLIZINE HCL 12.5 MG PO TABS
12.5000 mg | ORAL_TABLET | Freq: Three times a day (TID) | ORAL | Status: DC | PRN
  Administered 2018-08-12: 12.5 mg via ORAL
  Filled 2018-08-10: qty 1

## 2018-08-10 MED ORDER — METOPROLOL TARTRATE 50 MG PO TABS
50.0000 mg | ORAL_TABLET | Freq: Two times a day (BID) | ORAL | Status: DC
  Administered 2018-08-10 – 2018-08-19 (×18): 50 mg via ORAL
  Filled 2018-08-10 (×18): qty 1

## 2018-08-10 NOTE — Progress Notes (Signed)
Pharmacy Antibiotic Note  Eileen [name] is a 73 y.o. female admitted on 08/10/2018 with pneumonia.  Pharmacy has been consulted for Vancomycin dosing.  Plan: Vancomycin 1500 mg load (20mg /kg) followed by a maintenance dose of 750 mg every 12 hours.   Height: 5\' 2"  (157.5 cm) Weight: 173 lb (78.5 kg) IBW/kg (Calculated) : 50.1  Temp (24hrs), Avg:97.9 F (36.6 C), Min:97.9 F (36.6 C), Max:97.9 F (36.6 C)  Recent Labs  Lab 08/10/18 1156  WBC 14.1*  CREATININE 0.87    Estimated Creatinine Clearance: 55.9 mL/min (by C-G formula based on SCr of 0.87 mg/dL).    Allergies  Allergen Reactions  . Codeine Other (See Comments)    "jittery"    Antimicrobials this admission: Vanc 5/30 >>  Cefepime 5/30 >>   Microbiology results: SARS-CoV-2: negative   Thank you for allowing pharmacy to be a part of this patient's care.  Dionne Milo 08/10/2018 2:49 PM

## 2018-08-10 NOTE — ED Provider Notes (Signed)
[facility] EMERGENCY DEPARTMENT Provider Note   CSN: [id number] Arrival date & time: 08/10/18  1142    History   Chief Complaint Chief Complaint  Patient presents with   Shortness of Breath    HPI Eileen [name] is a 73 y.o. female.     HPI  Pt was seen at 1155.  Per pt, c/o gradual onset and worsening of persistent wheezing and SOB for the past 3 days.  Describes her symptoms as "my COPD." States she has been "turning up" her home O2 N/C from 3 to 4L due to her symptoms.  Has been using home MDI and nebs with very transient relief.  States she called her PMD yesterday who told her to take two tablets of her usual/daily prednisone (54m).  Denies fevers, cough, known COVID+ exposure. Denies CP/palpitations, no back pain, no abd pain, no N/V/D, no rash, no calf/LE pain or unilateral swelling.    Past Medical History:  Diagnosis Date   Asthma    Atrial flutter ([place]    Cancer ([place]    Right breast   COPD (chronic obstructive pulmonary disease) ([place]    History of breast cancer    right breast   Hypertension    On home O2    Type 2 diabetes mellitus ([place]     Patient Active Problem List   Diagnosis Date Noted   Acute and chronic respiratory failure (acute-on-chronic) ([place] 08/10/2018   Left lower lobe pneumonia ([place] 08/10/2018   Leukocytosis 08/10/2018   Chronic respiratory failure with hypoxia ([place] 06/28/2018   Acute upper GI bleeding 01/20/2018   Lower GI bleed 01/20/2018   Acute respiratory failure with hypoxia (HC[place] 09/17/2017   Diabetes mellitus (HC[place] 08/31/2017   Acute hypoxemic respiratory failure (HP[place] 08/31/2017   Atrial fibrillation with RVR (HE[place] 09/24/2016   Atrial flutter ([place] 09/23/2016   Obstructive chronic bronchitis with exacerbation ([place] 05/23/2016   Acute on chronic respiratory failure ([place] 094/50/3888  Metabolic encephalopathy 028/00/3491  HCAP (healthcare-associated pneumonia) 03/24/2015   Essential hypertension     Anticoagulation management encounter    New onset atrial flutter ([place] 03/21/2015   Obesity 03/21/2015   Atrial flutter with rapid ventricular response ([place] 03/21/2015   COPD (chronic obstructive pulmonary disease) ([place] 12/20/2014   COPD exacerbation (HE[place] 12/20/2014   DCIS (ductal carcinoma in situ) of breast, right, 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, Arimadex 03/10/2011    Past Surgical History:  Procedure Laterality Date   BREAST SURGERY  2011   Right breast mastectomy   CARDIOVERSION N/A 06/27/2018   Procedure: CARDIOVERSION;  Surgeon: BArnoldo Lenis MD;  Location[facility];  Service: Endoscopy;  Laterality: N/A;   CESAREAN SECTION     COLONOSCOPY N/A 01/22/2018   Procedure: COLONOSCOPY;  Surgeon: RRogene Houston MD;  Location: [facility] END[facility];  Service: Endoscopy;  Laterality: N/A;   HERNIA REPAIR     RIH   MASTECTOMY  2011   right breast   TEE WITHOUT CARDIOVERSION N/A 06/27/2018   Procedure: TRANSESOPHAGEAL ECHOCARDIOGRAM (TEE) WITH PROPOFOL;  Surgeon: BArnoldo Lenis MD;  Location: [facility];  Service: Endoscopy;  Laterality: N/A;     OB History    Gravida      Para      Term      Preterm      AB      Living  5     SAB      TAB      Ectopic  Multiple      Live Births               Home Medications    Prior to Admission medications   Medication Sig Start Date End Date Taking? Authorizing Provider  acetaminophen (TYLENOL) 500 MG tablet Take 500 mg by mouth every 8 (eight) hours as needed for mild pain or headache.    Yes Lavella Lemons, PA  albuterol (PROVENTIL) (2.5 MG/3ML) 0.083% nebulizer solution Take 2.5 mg by nebulization every 6 (six) hours as needed for wheezing or shortness of breath.   Yes [provider]  amiodarone (PACERONE) 200 MG tablet Take 1 tablet (200 mg total) by mouth daily. 07/20/18  Yes Strader, [place] M, PA-C  blood glucose meter kit and supplies KIT Dispense based on patient and  insurance preference. Use up to four times daily as directed. (FOR ICD-9 250.00, 250.01).  Check sugar before meals and at night, keep a log. 09/29/16  Yes Arrien, Jimmy Picket, MD  furosemide (LASIX) 40 MG tablet 20 mg daily.  07/10/18  Yes [provider]  guaiFENesin (MUCINEX) 600 MG 12 hr tablet Take 1 tablet (600 mg total) by mouth 2 (two) times daily. 09/21/17  Yes Kathie Dike, MD  meclizine (ANTIVERT) 12.5 MG tablet Take 1 tablet by mouth 3 (three) times daily as needed. 03/21/18  Yes [provider]  metFORMIN (GLUCOPHAGE-XR) 500 MG 24 hr tablet Take 500 mg by mouth daily. 08/10/17  Yes [provider]  metoprolol tartrate (LOPRESSOR) 50 MG tablet Take 50 mg by mouth 2 (two) times daily. 04/12/18  Yes [provider]  Multiple Vitamins-Minerals (EMERGEN-C IMMUNE) PACK Take 1 Package by mouth daily.   Yes [provider]  OXYGEN Inhale 2.5-3 L into the lungs daily. At night and daily as needed   Yes [provider]  predniSONE (DELTASONE) 10 MG tablet Take 10 mg by mouth daily. 04/02/18  Yes [provider]  PROAIR HFA 108 (90 BASE) MCG/ACT inhaler Inhale 2 puffs into the lungs every 6 (six) hours as needed for wheezing or shortness of breath.  03/06/11  Yes [provider]  psyllium (HYDROCIL/METAMUCIL) 95 % PACK Take 1 packet by mouth daily. Patient taking differently: Take 1 packet by mouth daily as needed.  01/24/18  Yes Johnson, Clanford L, MD  rivaroxaban (XARELTO) 20 MG TABS tablet Take 1 tablet (20 mg total) by mouth daily with supper. 01/27/18  Yes Johnson, Clanford L, MD  TRELEGY ELLIPTA 100-62.5-25 MCG/INH AEPB Take 1 puff by mouth daily. 08/27/17  Yes [provider]  Vitamin D, Ergocalciferol, (DRISDOL) 50000 units CAPS capsule Take 50,000 Units every 30 (thirty) days by mouth.   Yes [provider]    Family History Family History  Problem Relation Age of Onset   Cancer Mother         lung   Diabetes Brother     Social History Social History   Tobacco Use   Smoking status: Former Smoker    Packs/day: 0.50    Years: 32.00    Pack years: 16.00    Types: Cigarettes    Start date: 12/12/1962    Last attempt to quit: 03/13/1994    Years since quitting: 24.4   Smokeless tobacco: Never Used  Substance Use Topics   Alcohol use: No    Alcohol/week: 0.0 standard drinks   Drug use: No     Allergies   Codeine   Review of Systems Review of Systems ROS:  Statement: All systems negative except as marked or noted in the HPI; Constitutional: Negative for fever and chills. ; ; Eyes: Negative for eye pain, redness and discharge. ; ; ENMT: Negative for ear pain, hoarseness, nasal congestion, sinus pressure and sore throat. ; ; Cardiovascular: Negative for chest pain, palpitations, diaphoresis, and peripheral edema. ; ; Respiratory: +SOB, wheezing. Negative for cough and stridor. ; ; Gastrointestinal: Negative for nausea, vomiting, diarrhea, abdominal pain, blood in stool, hematemesis, jaundice and rectal bleeding. . ; ; Genitourinary: Negative for dysuria, flank pain and hematuria. ; ; Musculoskeletal: Negative for back pain and neck pain. Negative for swelling and trauma.; ; Skin: Negative for pruritus, rash, abrasions, blisters, bruising and skin lesion.; ; Neuro: Negative for headache, lightheadedness and neck stiffness. Negative for weakness, altered level of consciousness, altered mental status, extremity weakness, paresthesias, involuntary movement, seizure and syncope.       Physical Exam Updated Vital Signs BP (!) 107/93    Pulse 77    Temp 97.9 F (36.6 C) (Oral)    Resp 15    Ht 5' 2" (1.575 m)    Wt 78.5 kg    SpO2 100%    BMI 31.64 kg/m   Patient Vitals for the past 24 hrs:  BP Temp Temp src Pulse Resp SpO2 Height Weight  08/10/18 1430 127/62 -- -- 93 16 100 % -- --  08/10/18 1422 -- 97.9 F (36.6 C) Oral -- -- -- -- --  08/10/18 1400 (!) 107/93 -- -- 77 15  100 % -- --  08/10/18 1342 -- -- -- -- -- 93 % -- --  08/10/18 1330 (!) 130/96 -- -- 75 16 95 % -- --  08/10/18 1230 130/79 -- -- 74 (!) 25 96 % -- --  08/10/18 1200 (!) 118/43 -- -- 78 14 94 % -- --  08/10/18 1146 -- -- -- -- -- -- 5' 2" (1.575 m) 78.5 kg  08/10/18 1144 140/68 -- Oral 88 -- 90 % -- --      Physical Exam 1200: Physical examination:  Nursing notes reviewed; Vital signs and O2 SAT reviewed;  Constitutional: Well developed, Well nourished, Well hydrated, Mildly uncomfortable appearing.;; Head:  Normocephalic, atraumatic; Eyes: EOMI, PERRL, No scleral icterus; ENMT: Mouth and pharynx normal, Mucous membranes moist; Neck: Supple, Full range of motion, No lymphadenopathy; Cardiovascular: Tachycardic rate and rhythm, No gallop; Respiratory: Breath sounds diminished & equal bilaterally, insp/exp wheezes bilat. Faint audible wheezing. Speaking short sentences, sitting upright, tachypneic.; Chest: Nontender, Movement normal; Abdomen: Soft, Nontender, Nondistended, Normal bowel sounds; Genitourinary: No CVA tenderness; Extremities: Pulses normal, No tenderness, +tr pedal edema bilat.  No calf edema or asymmetry.; Neuro: AA&Ox3, Major CN grossly intact.  Speech clear. No gross focal motor or sensory deficits in extremities.; Skin: Color normal, Warm, Dry.    ED Treatments / Results  Labs (all labs ordered are listed, but only abnormal results are displayed)   EKG EKG Interpretation  Date/Time:  Saturday Aug 10 2018 11:54:06 EDT Ventricular Rate:  79 PR Interval:    QRS Duration: 90 QT Interval:  405 QTC Calculation: 465 R Axis:   26 Text Interpretation:  Sinus rhythm Borderline T wave abnormalities Baseline wander When compared with ECG of 06/27/2018 no apparent acute changes Confirmed by Francine Graven [phone]) on 08/10/2018 2:30:40 PM   Radiology   Procedures Procedures (including critical care time)  Medications Ordered in ED Medications  ceFEPIme (MAXIPIME) 1 g in  sodium chloride 0.9 % 100 mL IVPB (1 g  Intravenous New Bag/Given 08/10/18 1419)  albuterol (PROVENTIL,VENTOLIN) solution continuous neb (10 mg/hr Nebulization Given 08/10/18 1342)  ipratropium (ATROVENT) nebulizer solution 1 mg (1 mg Nebulization Given 08/10/18 1342)  methylPREDNISolone sodium succinate (SOLU-MEDROL) 125 mg/2 mL injection 125 mg (125 mg Intravenous Given 08/10/18 1419)     Initial Impression / Assessment and Plan / ED Course  I have reviewed the triage vital signs and the nursing notes.  Pertinent labs & imaging results that were available during my care of the patient were reviewed by me and considered in my medical decision making (see chart for details).       MDM Reviewed: previous chart, nursing note and vitals Reviewed previous: labs and ECG Interpretation: labs, ECG and x-ray Total time providing critical care: 30-74 minutes. This excludes time spent performing separately reportable procedures and services. Consults: admitting MD   CRITICAL CARE Performed by: Francine Graven Total critical care time: 35 minutes Critical care time was exclusive of separately billable procedures and treating other patients. Critical care was necessary to treat or prevent imminent or life-threatening deterioration. Critical care was time spent personally by me on the following activities: development of treatment plan with patient and/or surrogate as well as nursing, discussions with consultants, evaluation of patient's response to treatment, examination of patient, obtaining history from patient or surrogate, ordering and performing treatments and interventions, ordering and review of laboratory studies, ordering and review of radiographic studies, pulse oximetry and re-evaluation of patient's condition.    Results for orders placed or performed during the hospital encounter of 08/10/18  SARS Coronavirus 2 (CEPHEID - Performed in [place] hospital lab), [facility] Order  Result Value  Ref Range   SARS Coronavirus 2 NEGATIVE NEGATIVE  Basic metabolic panel  Result Value Ref Range   Sodium 140 135 - 145 mmol/L   Potassium 4.4 3.5 - 5.1 mmol/L   Chloride 99 98 - 111 mmol/L   CO2 27 22 - 32 mmol/L   Glucose, Bld 292 (H) 70 - 99 mg/dL   BUN 17 8 - 23 mg/dL   Creatinine, Ser 0.87 0.44 - 1.00 mg/dL   Calcium 9.4 8.9 - 10.3 mg/dL   GFR calc non Af Amer >60 >60 mL/min   GFR calc Af Amer >60 >60 mL/min   Anion gap 14 5 - 15  Brain natriuretic peptide  Result Value Ref Range   B Natriuretic Peptide 726.0 (H) 0.0 - 100.0 pg/mL  Troponin I - Once  Result Value Ref Range   Troponin I <0.03 <0.03 ng/mL  CBC with Differential  Result Value Ref Range   WBC 14.1 (H) 4.0 - 10.5 K/uL   RBC 4.22 3.87 - 5.11 MIL/uL   Hemoglobin 12.5 12.0 - 15.0 g/dL   HCT 41.6 36.0 - 46.0 %   MCV 98.6 80.0 - 100.0 fL   MCH 29.6 26.0 - 34.0 pg   MCHC 30.0 30.0 - 36.0 g/dL   RDW 14.4 11.5 - 15.5 %   Platelets 252 150 - 400 K/uL   nRBC 0.4 (H) 0.0 - 0.2 %   Neutrophils Relative % 85 %   Neutro Abs 12.0 (H) 1.7 - 7.7 K/uL   Lymphocytes Relative 9 %   Lymphs Abs 1.2 0.7 - 4.0 K/uL   Monocytes Relative 5 %   Monocytes Absolute 0.7 0.1 - 1.0 K/uL   Eosinophils Relative 0 %   Eosinophils Absolute 0.0 0.0 - 0.5 K/uL   Basophils Relative 0 %   Basophils Absolute 0.0 0.0 -  0.1 K/uL   Immature Granulocytes 1 %   Abs Immature Granulocytes 0.08 (H) 0.00 - 0.07 K/uL   Dg Chest Port 1 View Result Date: 08/10/2018 CLINICAL DATA:  Short of breath EXAM: PORTABLE CHEST 1 VIEW COMPARISON:  06/24/2018 FINDINGS: Upper normal heart size. Low lung volumes. Subsegmental atelectasis at the right base. Hazy airspace disease at the left base. Upper lungs clear. No pneumothorax. IMPRESSION: Left basilar consolidation. Followup PA and lateral chest X-ray is recommended in 3-4 weeks following trial of antibiotic therapy to ensure resolution and exclude underlying malignancy. Electronically Signed   By: Marybelle Killings M.D.    On: 08/10/2018 13:47    Eileen [name] was evaluated in Emergency Department on 08/10/2018 for the symptoms described in the history of present illness. She was evaluated in the context of the global COVID-19 pandemic, which necessitated consideration that the patient might be at risk for infection with the SARS-CoV-2 virus that causes COVID-19. Institutional protocols and algorithms that pertain to the evaluation of patients at risk for COVID-19 are in a state of rapid change based on information released by regulatory bodies including the CDC and federal and state organizations. These policies and algorithms were followed during the patient's care in the ED.   Results for Allebach, Eileen A (MRN [id number]) as of 08/10/2018 14:49  Ref. Range 06/24/2018 13:02 08/10/2018 11:56  B Natriuretic Peptide Latest Ref Range: 0.0 - 100.0 pg/mL 894.0 (H) 726.0 (H)    1410:  On arrival: pt sitting upright, tachypneic, tachycardic, Sats 90 % on O2 4L N/C, lungs diminished with scattered wheezes. COVID testing ordered: negative. IV solumedrol and hour long neb then started. IV abx started for infiltrate on CXR. PSI/PORT score risk class IV; will admit.  BNP elevated but lower from previous, but no overt CHF on CXR; will hold diuresis for now. Dx and testing d/w pt.  Questions answered.  Verb understanding, agreeable to admit. T/C returned from [facility]. Wynetta Emery, case discussed, including:  HPI, pertinent PM/SHx, VS/PE, dx testing, ED course and treatment:  Agreeable to admit.       Final Clinical Impressions(s) / ED Diagnoses   Final diagnoses:  Shortness of breath  COPD with acute exacerbation ([place])  HCAP (healthcare-associated pneumonia)    ED Discharge Orders    None       Francine Graven, DO 08/14/18 [phone]

## 2018-08-10 NOTE — ED Notes (Signed)
Have put a bedside commode in patients room

## 2018-08-10 NOTE — ED Notes (Signed)
Pt's O2 sats decreased with movement to the bedside commode. Pt is on 4L [place]

## 2018-08-10 NOTE — H&P (Signed)
History and Physical  Eileen [name] XLK:[id number] DOB: February 03, 1946 DOA: 08/10/2018  Referring physician: Thurnell Garbe  PCP: Lavella Lemons, [place]   Chief Complaint: SOB  HPI: Eileen [name] is a 73 y.o. female with steroid-dependent and chronic oxygen requiring COPD reports that for the past 3 days she has been having increasing shortness of breath.  She had increased her home oxygen and nebulizer treatments for the past couple of days and initially had some relief but continues to have increasing shortness of breath prompting her to come to the emergency department.  She denies chest pain.  She denies fever and chills.  She reports that she has had a mostly nonproductive cough.  She has been wheezing more than normal.  She denies any known sick contacts.  No known exposure to COVID.  ED course: Patient was tachypneic on arrival and satting 90% on 4 L nasal cannula.  She was having accessory muscle use and diffuse wheezing.  She was given IV Solu-Medrol and a 1 hour long nebulizer treatment.  Her chest x-ray was positive for left lower lobe infiltrate and she was started on IV antibiotics.  She had a mildly elevated BNP but no CHF noted on chest x-ray.  She is being admitted for further evaluation and management.  Review of Systems: All systems reviewed and apart from history of presenting illness, are negative.  Past Medical History:  Diagnosis Date  . Asthma   . Atrial flutter ([place])   . Cancer [facility])    Right breast  . COPD (chronic obstructive pulmonary disease) ([place])   . History of breast cancer    right breast  . Hypertension   . On home O2   . Type 2 diabetes mellitus ([place])    Past Surgical History:  Procedure Laterality Date  . BREAST SURGERY  2011   Right breast mastectomy  . CARDIOVERSION N/A 06/27/2018   Procedure: CARDIOVERSION;  Surgeon: Arnoldo Lenis, MD;  [facility];  Service: Endoscopy;  Laterality: N/A;  . CESAREAN SECTION    . COLONOSCOPY N/A  01/22/2018   Procedure: COLONOSCOPY;  Surgeon: Rogene Houston, MD;  Location: [facility] ENDO SUITE;  Service: Endoscopy;  Laterality: N/A;  . HERNIA REPAIR     RIH  . MASTECTOMY  2011   right breast  . TEE WITHOUT CARDIOVERSION N/A 06/27/2018   Procedure: TRANSESOPHAGEAL ECHOCARDIOGRAM (TEE) WITH PROPOFOL;  Surgeon: Arnoldo Lenis, MD;  Location: [facility];  Service: Endoscopy;  Laterality: N/A;   Social History:  reports that she quit smoking about 24 years ago. Her smoking use included cigarettes. She started smoking about 55 years ago. She has a 16.00 pack-year smoking history. She has never used smokeless tobacco. She reports that she does not drink alcohol or use drugs.  Allergies  Allergen Reactions  . Codeine Other (See Comments)    "jittery"    Family History  Problem Relation Age of Onset  . Cancer Mother        lung  . Diabetes Brother     Prior to Admission medications   Medication Sig Start Date End Date Taking? Authorizing Provider  acetaminophen (TYLENOL) 500 MG tablet Take 500 mg by mouth every 8 (eight) hours as needed for mild pain or headache.    Yes Lavella Lemons, PA  albuterol (PROVENTIL) (2.5 MG/3ML) 0.083% nebulizer solution Take 2.5 mg by nebulization every 6 (six) hours as needed for wheezing or shortness of breath.   Yes [provider]  amiodarone (PACERONE) 200 MG tablet Take 1 tablet (200 mg total) by mouth daily. 07/20/18  Yes Strader, [place] M, PA-C  blood glucose meter kit and supplies KIT Dispense based on patient and insurance preference. Use up to four times daily as directed. (FOR ICD-9 250.00, 250.01).  Check sugar before meals and at night, keep a log. 09/29/16  Yes Arrien, Jimmy Picket, MD  furosemide (LASIX) 40 MG tablet 20 mg daily.  07/10/18  Yes [provider]  guaiFENesin (MUCINEX) 600 MG 12 hr tablet Take 1 tablet (600 mg total) by mouth 2 (two) times daily. 09/21/17  Yes Kathie Dike, MD  meclizine (ANTIVERT)  12.5 MG tablet Take 1 tablet by mouth 3 (three) times daily as needed. 03/21/18  Yes [provider]  metFORMIN (GLUCOPHAGE-XR) 500 MG 24 hr tablet Take 500 mg by mouth daily. 08/10/17  Yes [provider]  metoprolol tartrate (LOPRESSOR) 50 MG tablet Take 50 mg by mouth 2 (two) times daily. 04/12/18  Yes [provider]  Multiple Vitamins-Minerals (EMERGEN-C IMMUNE) PACK Take 1 Package by mouth daily.   Yes [provider]  OXYGEN Inhale 2.5-3 L into the lungs daily. At night and daily as needed   Yes [provider]  predniSONE (DELTASONE) 10 MG tablet Take 10 mg by mouth daily. 04/02/18  Yes [provider]  PROAIR HFA 108 (90 BASE) MCG/ACT inhaler Inhale 2 puffs into the lungs every 6 (six) hours as needed for wheezing or shortness of breath.  03/06/11  Yes [provider]  psyllium (HYDROCIL/METAMUCIL) 95 % PACK Take 1 packet by mouth daily. Patient taking differently: Take 1 packet by mouth daily as needed.  01/24/18  Yes Harvir Patry L, MD  rivaroxaban (XARELTO) 20 MG TABS tablet Take 1 tablet (20 mg total) by mouth daily with supper. 01/27/18  Yes Dacota Devall L, MD  TRELEGY ELLIPTA 100-62.5-25 MCG/INH AEPB Take 1 puff by mouth daily. 08/27/17  Yes [provider]  Vitamin D, Ergocalciferol, (DRISDOL) 50000 units CAPS capsule Take 50,000 Units every 30 (thirty) days by mouth.   Yes [provider]   Physical Exam: Vitals:   08/10/18 1400 08/10/18 1422 08/10/18 1430 08/10/18 1500  BP: (!) 107/93  127/62 139/67  Pulse: 77  93 (!) 103  Resp: 15  16 (!) 21  Temp:  97.9 F (36.6 C)    TempSrc:  Oral    SpO2: 100%  100% 93%  Weight:      Height:        General exam: Chronically ill-appearing female.  Moderately built and nourished patient, lying comfortably supine on the gurney in no obvious distress.  Head, eyes and ENT: Nontraumatic and normocephalic. Pupils equally reacting to light and  accommodation. Oral mucosa moist.  Neck: Supple. No JVD, carotid bruit or thyromegaly.  Lymphatics: No lymphadenopathy.  Respiratory system: Expiratory wheezes heard bilaterally diminished breath sounds in left lower lobe. No increased work of breathing.  Cardiovascular system: Normal S1 and S2 heard. No JVD, murmurs, gallops, clicks or pedal edema.  Gastrointestinal system: Abdomen is nondistended, soft and nontender. Normal bowel sounds heard. No organomegaly or masses appreciated.  Central nervous system: Alert and oriented. No focal neurological deficits.  Extremities: Symmetric 5 x 5 power. Peripheral pulses symmetrically felt.   Skin: No rashes or acute findings.  Musculoskeletal system: Negative exam.  Psychiatry: Pleasant and cooperative.  Labs on Admission:  Basic Metabolic Panel: Recent Labs  Lab 08/10/18 1156  NA 140  K 4.4  CL 99  CO2 27  GLUCOSE 292*  BUN 17  CREATININE 0.87  CALCIUM 9.4   Liver Function Tests: No results for input(s): AST, ALT, ALKPHOS, BILITOT, PROT, ALBUMIN in the last 168 hours. No results for input(s): LIPASE, AMYLASE in the last 168 hours. No results for input(s): AMMONIA in the last 168 hours. CBC: Recent Labs  Lab 08/10/18 1156  WBC 14.1*  NEUTROABS 12.0*  HGB 12.5  HCT 41.6  MCV 98.6  PLT 252   Cardiac Enzymes: Recent Labs  Lab 08/10/18 1156  TROPONINI <0.03    BNP (last 3 results) No results for input(s): PROBNP in the last 8760 hours. CBG: No results for input(s): GLUCAP in the last 168 hours.  Radiological Exams on Admission: Dg Chest Port 1 View  Result Date: 08/10/2018 CLINICAL DATA:  Short of breath EXAM: PORTABLE CHEST 1 VIEW COMPARISON:  06/24/2018 FINDINGS: Upper normal heart size. Low lung volumes. Subsegmental atelectasis at the right base. Hazy airspace disease at the left base. Upper lungs clear. No pneumothorax. IMPRESSION: Left basilar consolidation. Followup PA and lateral chest X-ray is  recommended in 3-4 weeks following trial of antibiotic therapy to ensure resolution and exclude underlying malignancy. Electronically Signed   By: Marybelle Killings M.D.   On: 08/10/2018 13:47   EKG: Independently reviewed.  Normal sinus rhythm.  Assessment/Plan Principal Problem:   Acute and chronic respiratory failure (acute-on-chronic) (HCC) Active Problems:   COPD (chronic obstructive pulmonary disease) (HCC)   Essential hypertension   Atrial flutter (HCC)   Diabetes mellitus ([place])   Chronic respiratory failure with hypoxia (HCC)   Left lower lobe pneumonia (HCC)   Leukocytosis  1. Acute on chronic respiratory failure-likely secondary to pneumonia- admit to stepdown unit.  Treating pneumonia aggressively with concern for nosocomial infection given recent hospitalization.  Continue supportive therapy.  Increasing steroids temporarily.  Follow clinically. 2. Chronic respiratory failure/COPD-resuming home respiratory medications and scheduled nebulizer treatments. 3. Type 2 diabetes mellitus- sliding scale and prandial insulin ordered, monitor CBG closely. 4. Leukocytosis- likely secondary to chronic steroid use and pneumonia infection, follow CBC.  Anticipate leukocytosis associated with IV steroids. 5. Chronic atrial flutter-resume home cardiac medications.  Patient is fully anticoagulated with Xarelto.   DVT Prophylaxis: Xarelto Code Status: Full Family Communication:   Disposition Plan: Stepdown unit  Critical CareTime spent: 82 minutes  Genia Perin Wynetta Emery, MD [facility] How to contact the [facility] Attending or Consulting provider [place] or covering provider during after hours [place], for this patient?  1. Check the care team in [facility] and look for a) attending/consulting TRH provider listed and b) the [facility] team listed 2. Log into [email or web address] and use [place]'s universal password to access. If you do not have the password, please contact the hospital operator. 3. Locate the [facility]  provider you are looking for under [facility] and page to a number that you can be directly reached. 4. If you still have difficulty reaching the provider, please page the [facility] (Director on Call) for the Hospitalists listed on amion for assistance.

## 2018-08-10 NOTE — ED Triage Notes (Signed)
Patient reports increasing SOB x 2-3 days. No exposure, fever, or cough. Patient has hx of COPD.

## 2018-08-11 DIAGNOSIS — E1169 Type 2 diabetes mellitus with other specified complication: Secondary | ICD-10-CM

## 2018-08-11 LAB — CBC WITH DIFFERENTIAL/PLATELET
Abs Immature Granulocytes: 0.07 10*3/uL (ref 0.00–0.07)
Basophils Absolute: 0 10*3/uL (ref 0.0–0.1)
Basophils Relative: 0 %
Eosinophils Absolute: 0 10*3/uL (ref 0.0–0.5)
Eosinophils Relative: 0 %
HCT: 38.6 % (ref 36.0–46.0)
Hemoglobin: 11.2 g/dL — ABNORMAL LOW (ref 12.0–15.0)
Immature Granulocytes: 1 %
Lymphocytes Relative: 5 %
Lymphs Abs: 0.6 10*3/uL — ABNORMAL LOW (ref 0.7–4.0)
MCH: 29.6 pg (ref 26.0–34.0)
MCHC: 29 g/dL — ABNORMAL LOW (ref 30.0–36.0)
MCV: 102.1 fL — ABNORMAL HIGH (ref 80.0–100.0)
Monocytes Absolute: 0.4 10*3/uL (ref 0.1–1.0)
Monocytes Relative: 3 %
Neutro Abs: 10.8 10*3/uL — ABNORMAL HIGH (ref 1.7–7.7)
Neutrophils Relative %: 91 %
Platelets: 238 10*3/uL (ref 150–400)
RBC: 3.78 MIL/uL — ABNORMAL LOW (ref 3.87–5.11)
RDW: 14.3 % (ref 11.5–15.5)
WBC: 11.8 10*3/uL — ABNORMAL HIGH (ref 4.0–10.5)
nRBC: 0.5 % — ABNORMAL HIGH (ref 0.0–0.2)

## 2018-08-11 LAB — COMPREHENSIVE METABOLIC PANEL
ALT: 22 U/L (ref 0–44)
AST: 18 U/L (ref 15–41)
Albumin: 3.8 g/dL (ref 3.5–5.0)
Alkaline Phosphatase: 46 U/L (ref 38–126)
Anion gap: 14 (ref 5–15)
BUN: 15 mg/dL (ref 8–23)
CO2: 30 mmol/L (ref 22–32)
Calcium: 9 mg/dL (ref 8.9–10.3)
Chloride: 99 mmol/L (ref 98–111)
Creatinine, Ser: 0.71 mg/dL (ref 0.44–1.00)
GFR calc Af Amer: >60 mL/min (ref >60)
GFR calc non Af Amer: >60 mL/min (ref >60)
Glucose, Bld: 279 mg/dL — ABNORMAL HIGH (ref 70–99)
Potassium: 4.8 mmol/L (ref 3.5–5.1)
Sodium: 143 mmol/L (ref 135–145)
Total Bilirubin: 0.6 mg/dL (ref 0.3–1.2)
Total Protein: 6.4 g/dL — ABNORMAL LOW (ref 6.5–8.1)

## 2018-08-11 LAB — GLUCOSE, CAPILLARY
Glucose-Capillary: 259 mg/dL — ABNORMAL HIGH (ref 70–99)
Glucose-Capillary: 246 mg/dL — ABNORMAL HIGH (ref 70–99)
Glucose-Capillary: 186 mg/dL — ABNORMAL HIGH (ref 70–99)
Glucose-Capillary: 80 mg/dL (ref 70–99)
Glucose-Capillary: 213 mg/dL — ABNORMAL HIGH (ref 70–99)

## 2018-08-11 LAB — HIV ANTIBODY (ROUTINE TESTING W REFLEX): HIV Screen 4th Generation wRfx: NONREACTIVE

## 2018-08-11 LAB — MAGNESIUM: Magnesium: 2.2 mg/dL (ref 1.7–2.4)

## 2018-08-11 MED ORDER — SODIUM CHLORIDE 0.9 % IV SOLN
2.0000 g | Freq: Three times a day (TID) | INTRAVENOUS | Status: DC
  Administered 2018-08-11 – 2018-08-17 (×18): 2 g via INTRAVENOUS
  Filled 2018-08-11 (×18): qty 2

## 2018-08-11 MED ORDER — INSULIN ASPART 100 UNIT/ML ~~LOC~~ SOLN
10.0000 [IU] | Freq: Three times a day (TID) | SUBCUTANEOUS | Status: DC
  Administered 2018-08-12 – 2018-08-14 (×4): 10 [IU] via SUBCUTANEOUS

## 2018-08-11 MED ORDER — IPRATROPIUM-ALBUTEROL 0.5-2.5 (3) MG/3ML IN SOLN
3.0000 mL | Freq: Four times a day (QID) | RESPIRATORY_TRACT | Status: DC
  Administered 2018-08-12 – 2018-08-18 (×25): 3 mL via RESPIRATORY_TRACT
  Filled 2018-08-11 (×25): qty 3

## 2018-08-11 MED ORDER — ALBUTEROL SULFATE (2.5 MG/3ML) 0.083% IN NEBU
2.5000 mg | INHALATION_SOLUTION | RESPIRATORY_TRACT | Status: DC | PRN
  Administered 2018-08-12: 17:00:00 2.5 mg via RESPIRATORY_TRACT
  Filled 2018-08-11: qty 3

## 2018-08-11 MED ORDER — INSULIN ASPART 100 UNIT/ML ~~LOC~~ SOLN
14.0000 [IU] | Freq: Three times a day (TID) | SUBCUTANEOUS | Status: DC
  Administered 2018-08-11 (×2): 14 [IU] via SUBCUTANEOUS

## 2018-08-11 NOTE — Progress Notes (Addendum)
PROGRESS NOTE  Eileen [name]  TMA:[id number]  DOB: [birth year]  DOA: 08/10/2018 PCP: Lavella Lemons, PA  Brief Admission Hx: 74 year old oxygen and steroid-dependent COPD presented with 3 days of progressive shortness of breath.  MDM/Assessment & Plan:   1. Acute on chronic respiratory failure-suspect this is secondary to pneumonia.  Continue stepdown ICU care.  Continue treating pneumonia aggressively and continue supportive therapy.  Continue IV steroid.  SARS coronavirus 2 was negative. 2. Chronic respiratory failure/severe COPD- she is on temporary IV steroids and resumed home bronchodilators. 3. Left basilar pneumonia- continue antibiotics.  We have de-escalated.  Vancomycin DC'd with negative MRSA screen.  Continue cefepime.  Continue supportive therapy. 4. Type 2 diabetes mellitus, insulin requiring with hyperglycemia- she is having some steroid induced hyperglycemia.  We have increased her prandial and sliding scale coverage.  Continue monitoring CBGs closely and further titrate insulin doses as needed. 5. Chronic atrial flutter- we have resumed home cardiac medications and full anticoagulation with Xarelto.  DVT prophylaxis: Xarelto Code Status: Full Family Communication: daughter telephone  Disposition Plan: continue stepdown ICU care   Consultants:    Procedures:    Antimicrobials:  Cefepime 5/30   Vancomycin 5/30-5/31   Subjective: Patient says she still having a productive cough and having occasional shortness of breath especially with ambulation.  She denies chest pain.  Objective: Vitals:   08/11/18 0400 08/11/18 0458 08/11/18 0555 08/11/18 0735  BP: 116/66     Pulse: 81     Resp: (!) 22     Temp: 98.3 F (36.8 C)   98.1 F (36.7 C)  TempSrc: Oral   Oral  SpO2: 97% 97%  (!) 89%  Weight:   80.9 kg   Height:        Intake/Output Summary (Last 24 hours) at 08/11/2018 1130 Last data filed at 08/11/2018 1000 Gross per 24 hour  Intake 857.03 ml   Output 1050 ml  Net -192.97 ml   Filed Weights   08/10/18 1146 08/10/18 1638 08/11/18 0555  Weight: 78.5 kg 79.7 kg 80.9 kg   REVIEW OF SYSTEMS  As per history otherwise all reviewed and reported negative  Exam:  General exam: awake, alert, NAD, cooperative.  Respiratory system: rales LLL with wheezing heard. No increased work of breathing. Cardiovascular system: normal S1 & S2 heard.  Gastrointestinal system: Abdomen is nondistended, soft and nontender. Normal bowel sounds heard. Central nervous system: Alert and oriented. No focal neurological deficits. Extremities: trace pretibial edema BLEs.  Data Reviewed: Basic Metabolic Panel: Recent Labs  Lab 08/10/18 1156 08/11/18 0359  NA 140 143  K 4.4 4.8  CL 99 99  CO2 27 30  GLUCOSE 292* 279*  BUN 17 15  CREATININE 0.87 0.71  CALCIUM 9.4 9.0  MG  --  2.2   Liver Function Tests: Recent Labs  Lab 08/11/18 0359  AST 18  ALT 22  ALKPHOS 46  BILITOT 0.6  PROT 6.4*  ALBUMIN 3.8   No results for input(s): LIPASE, AMYLASE in the last 168 hours. No results for input(s): AMMONIA in the last 168 hours. CBC: Recent Labs  Lab 08/10/18 1156 08/11/18 0359  WBC 14.1* 11.8*  NEUTROABS 12.0* 10.8*  HGB 12.5 11.2*  HCT 41.6 38.6  MCV 98.6 102.1*  PLT 252 238   Cardiac Enzymes: Recent Labs  Lab 08/10/18 1156  TROPONINI <0.03   CBG (last 3)  Recent Labs    08/10/18 2126 08/11/18 0342 08/11/18 0740  GLUCAP 243* 259* 246*  Recent Results (from the past 240 hour(s))  SARS Coronavirus 2 (CEPHEID - Performed in [facility] hospital lab), Hosp Order     Status: None   Collection Time: 08/10/18 11:56 AM  Result Value Ref Range Status   SARS Coronavirus 2 NEGATIVE NEGATIVE Final    Comment: (NOTE) If result is NEGATIVE SARS-CoV-2 target nucleic acids are NOT DETECTED. The SARS-CoV-2 RNA is generally detectable in upper and lower  respiratory specimens during the acute phase of infection. The lowest   concentration of SARS-CoV-2 viral copies this assay can detect is 250  copies / mL. A negative result does not preclude SARS-CoV-2 infection  and should not be used as the sole basis for treatment or other  patient management decisions.  A negative result may occur with  improper specimen collection / handling, submission of specimen other  than nasopharyngeal swab, presence of viral mutation(s) within the  areas targeted by this assay, and inadequate number of viral copies  (<250 copies / mL). A negative result must be combined with clinical  observations, patient history, and epidemiological information. If result is POSITIVE SARS-CoV-2 target nucleic acids are DETECTED. The SARS-CoV-2 RNA is generally detectable in upper and lower  respiratory specimens dur ing the acute phase of infection.  Positive  results are indicative of active infection with SARS-CoV-2.  Clinical  correlation with patient history and other diagnostic information is  necessary to determine patient infection status.  Positive results do  not rule out bacterial infection or co-infection with other viruses. If result is PRESUMPTIVE POSTIVE SARS-CoV-2 nucleic acids MAY BE PRESENT.   A presumptive positive result was obtained on the submitted specimen  and confirmed on repeat testing.  While 2019 novel coronavirus  (SARS-CoV-2) nucleic acids may be present in the submitted sample  additional confirmatory testing may be necessary for epidemiological  and / or clinical management purposes  to differentiate between  SARS-CoV-2 and other Sarbecovirus currently known to infect humans.  If clinically indicated additional testing with an alternate test  methodology [phone]) is advised. The SARS-CoV-2 RNA is generally  detectable in upper and lower respiratory sp ecimens during the acute  phase of infection. The expected result is Negative. Fact Sheet for Patients:  StrictlyIdeas.no Fact Sheet  for Healthcare Providers: BankingDealers.co.za This test is not yet approved or cleared by the [place] FDA and has been authorized for detection and/or diagnosis of SARS-CoV-2 by FDA under an Emergency Use Authorization (EUA).  This EUA will remain in effect (meaning this test can be used) for the duration of the COVID-19 declaration under Section 564(b)(1) of the Act, 21 U.S.C. section 360bbb-3(b)(1), unless the authorization is terminated or revoked sooner. Performed at [facility], [address], [place], [place] [address]   Culture, blood (routine x 2) Call MD if unable to obtain prior to antibiotics being given     Status: None (Preliminary result)   Collection Time: 08/10/18  3:19 PM  Result Value Ref Range Status   Specimen Description BLOOD LEFT ARM  Final   Special Requests   Final    BOTTLES DRAWN AEROBIC AND ANAEROBIC Blood Culture adequate volume   Culture   Final    NO GROWTH < 24 HOURS Performed at [facility], [address], [place], [place] [address]    Report Status PENDING  Incomplete  Culture, blood (routine x 2) Call MD if unable to obtain prior to antibiotics being given     Status: None (Preliminary result)   Collection  Time: 08/10/18  3:28 PM  Result Value Ref Range Status   Specimen Description BLOOD LEFT WRIST  Final   Special Requests   Final    BOTTLES DRAWN AEROBIC AND ANAEROBIC Blood Culture adequate volume   Culture   Final    NO GROWTH < 24 HOURS Performed at [facility], [address] [place], [place] [address]    Report Status PENDING  Incomplete  MRSA PCR Screening     Status: None   Collection Time: 08/10/18  4:19 PM  Result Value Ref Range Status   MRSA by PCR NEGATIVE NEGATIVE Final    Comment:        The GeneXpert MRSA Assay (FDA approved for NASAL specimens only), is one component of a comprehensive MRSA colonization surveillance program. It is not intended to diagnose MRSA infection nor to guide or  monitor treatment for MRSA infections. Performed at [facility], [address], [place], [place] [address]      Studies: Dg Chest [facility] 1 View  Result Date: 08/10/2018 CLINICAL DATA:  Short of breath EXAM: PORTABLE CHEST 1 VIEW COMPARISON:  06/24/2018 FINDINGS: Upper normal heart size. Low lung volumes. Subsegmental atelectasis at the right base. Hazy airspace disease at the left base. Upper lungs clear. No pneumothorax. IMPRESSION: Left basilar consolidation. Followup PA and lateral chest X-ray is recommended in 3-4 weeks following trial of antibiotic therapy to ensure resolution and exclude underlying malignancy. Electronically Signed   By: Marybelle Killings M.D.   On: 08/10/2018 13:47   Scheduled Meds: . amiodarone  200 mg Oral Daily  . fluticasone furoate-vilanterol  1 puff Inhalation Daily  . furosemide  20 mg Oral Daily  . guaiFENesin  1,200 mg Oral BID  . insulin aspart  0-20 Units Subcutaneous TID WC  . insulin aspart  0-5 Units Subcutaneous QHS  . insulin aspart  14 Units Subcutaneous TID WC  . ipratropium-albuterol  3 mL Nebulization Q4H  . methylPREDNISolone (SOLU-MEDROL) injection  40 mg Intravenous Q6H  . metoprolol tartrate  50 mg Oral BID  . pneumococcal 23 valent vaccine  0.5 mL Intramuscular Tomorrow-1000  . rivaroxaban  20 mg Oral Q supper  . umeclidinium bromide  1 puff Inhalation Daily   Continuous Infusions: . ceFEPime (MAXIPIME) IV      Principal Problem:   Acute and chronic respiratory failure (acute-on-chronic) (HCC) Active Problems:   COPD (chronic obstructive pulmonary disease) (HCC)   Essential hypertension   Atrial flutter (HCC)   Diabetes mellitus ([place])   Chronic respiratory failure with hypoxia (HCC)   Left lower lobe pneumonia (HCC)   Leukocytosis  Critical care Time spent: 31 minutes  Irwin Brakeman, MD [facility] 08/11/2018, 11:30 AM    LOS: 1 day  How to contact the [facility] Attending or Consulting provider [place] or covering provider  during after hours [place], for this patient?  1. Check the care team in [facility] and look for a) attending/consulting TRH provider listed and b) the [facility] team listed 2. Log into [email or web address] and use [place]'s universal password to access. If you do not have the password, please contact the hospital operator. 3. Locate the [facility] provider you are looking for under [facility] and page to a number that you can be directly reached. 4. If you still have difficulty reaching the provider, please page the [facility] (Director on Call) for the Hospitalists listed on amion for assistance.

## 2018-08-11 NOTE — Progress Notes (Signed)
Pharmacy Antibiotic Note  Eileen [name] is a 73 y.o. female admitted on 08/10/2018 with pneumonia.  Pharmacy has been consulted for cefepime dosing.  Plan: Increase cefepime 2gm IV q8h F/U cxs and clinical progress  Monitor V/S, labs and levels as indicated  Height: 5\' 2"  (157.5 cm) Weight: 178 lb 5.6 oz (80.9 kg) IBW/kg (Calculated) : 50.1  Temp (24hrs), Avg:98.1 F (36.7 C), Min:97.9 F (36.6 C), Max:98.4 F (36.9 C)  Recent Labs  Lab 08/10/18 1156 08/11/18 0359  WBC 14.1* 11.8*  CREATININE 0.87 0.71    Estimated Creatinine Clearance: 61.7 mL/min (by C-G formula based on SCr of 0.71 mg/dL).    Allergies  Allergen Reactions  . Codeine Other (See Comments)    "jittery"    Antimicrobials this admission: Vanc 5/30 >> 5/31 Cefepime 5/30 >>   Dose adjustment: renal fxn improved 5/31 Cefepime increase to 2gm IV q8h  Microbiology results: 5/30 [facility]: ngtd 5/30 MRSA PCR is negative   5/30 SARS-CoV-2: negative   Thank you for allowing pharmacy to be a part of this patient's care. Isac Sarna, BS Pharm D, [place] Clinical Pharmacist Pager [phone] 08/11/2018 11:08 AM

## 2018-08-12 DIAGNOSIS — J181 Lobar pneumonia, unspecified organism: Secondary | ICD-10-CM

## 2018-08-12 LAB — CBC WITH DIFFERENTIAL/PLATELET
Abs Immature Granulocytes: 0.06 10*3/uL (ref 0.00–0.07)
Basophils Absolute: 0 10*3/uL (ref 0.0–0.1)
Basophils Relative: 0 %
Eosinophils Absolute: 0 10*3/uL (ref 0.0–0.5)
Eosinophils Relative: 0 %
HCT: 38.2 % (ref 36.0–46.0)
Hemoglobin: 11.1 g/dL — ABNORMAL LOW (ref 12.0–15.0)
Immature Granulocytes: 0 %
Lymphocytes Relative: 3 %
Lymphs Abs: 0.5 10*3/uL — ABNORMAL LOW (ref 0.7–4.0)
MCH: 29.4 pg (ref 26.0–34.0)
MCHC: 29.1 g/dL — ABNORMAL LOW (ref 30.0–36.0)
MCV: 101.3 fL — ABNORMAL HIGH (ref 80.0–100.0)
Monocytes Absolute: 0.7 10*3/uL (ref 0.1–1.0)
Monocytes Relative: 5 %
Neutro Abs: 14.7 10*3/uL — ABNORMAL HIGH (ref 1.7–7.7)
Neutrophils Relative %: 92 %
Platelets: 242 10*3/uL (ref 150–400)
RBC: 3.77 MIL/uL — ABNORMAL LOW (ref 3.87–5.11)
RDW: 14.1 % (ref 11.5–15.5)
WBC: 16 10*3/uL — ABNORMAL HIGH (ref 4.0–10.5)
nRBC: 0.4 % — ABNORMAL HIGH (ref 0.0–0.2)

## 2018-08-12 LAB — COMPREHENSIVE METABOLIC PANEL
ALT: 26 U/L (ref 0–44)
AST: 20 U/L (ref 15–41)
Albumin: 3.6 g/dL (ref 3.5–5.0)
Alkaline Phosphatase: 46 U/L (ref 38–126)
Anion gap: 12 (ref 5–15)
BUN: 20 mg/dL (ref 8–23)
CO2: 29 mmol/L (ref 22–32)
Calcium: 8.8 mg/dL — ABNORMAL LOW (ref 8.9–10.3)
Chloride: 98 mmol/L (ref 98–111)
Creatinine, Ser: 0.58 mg/dL (ref 0.44–1.00)
GFR calc Af Amer: >60 mL/min (ref >60)
GFR calc non Af Amer: >60 mL/min (ref >60)
Glucose, Bld: 178 mg/dL — ABNORMAL HIGH (ref 70–99)
Potassium: 5.1 mmol/L (ref 3.5–5.1)
Sodium: 139 mmol/L (ref 135–145)
Total Bilirubin: 0.5 mg/dL (ref 0.3–1.2)
Total Protein: 6.2 g/dL — ABNORMAL LOW (ref 6.5–8.1)

## 2018-08-12 LAB — GLUCOSE, CAPILLARY
Glucose-Capillary: 168 mg/dL — ABNORMAL HIGH (ref 70–99)
Glucose-Capillary: 151 mg/dL — ABNORMAL HIGH (ref 70–99)
Glucose-Capillary: 82 mg/dL (ref 70–99)
Glucose-Capillary: 229 mg/dL — ABNORMAL HIGH (ref 70–99)

## 2018-08-12 LAB — MAGNESIUM: Magnesium: 2.4 mg/dL (ref 1.7–2.4)

## 2018-08-12 MED ORDER — SODIUM CHLORIDE 0.9 % IV SOLN
INTRAVENOUS | Status: DC | PRN
  Administered 2018-08-12: 23:00:00 250 mL via INTRAVENOUS
  Administered 2018-08-15: 23:00:00 500 mL via INTRAVENOUS

## 2018-08-12 MED ORDER — FLUTICASONE PROPIONATE 50 MCG/ACT NA SUSP
2.0000 | Freq: Every day | NASAL | Status: DC
  Administered 2018-08-12 – 2018-08-19 (×8): 2 via NASAL
  Filled 2018-08-12 (×3): qty 16

## 2018-08-12 NOTE — TOC Initial Note (Signed)
Transition of Care [facility]) - Initial/Assessment Note    Patient Details  Name: Eileen [name] MRN: [id number] Date of Birth: 1945/11/24  Transition of Care [facility]) CM/SW Contact:    Shade Flood, LCSW Phone Number: 08/12/2018, 11:11 AM  Clinical Narrative:                  Pt known to this LCSW from recent admission. Pt lives in her own home with adult children. Pt is chronically on O2 at home. Pt [facility] with [facility] RN last admission. Per Vaughan Basta with [facility], they discharged pt from [facility] on 07/18/18. Will refer to [facility] again if pt needs at dc. Updated [place] of pt's admission. Will follow for TOC needs.  Expected Discharge Plan: [place] Barriers to Discharge: Continued Medical Work up   Patient Goals and CMS Choice        Expected Discharge Plan and Services Expected Discharge Plan: [place]       Living arrangements for the past 2 months: Single Family Home                                      Prior Living Arrangements/Services Living arrangements for the past 2 months: Single Family Home Lives with:: Adult Children Patient language and need for interpreter reviewed:: Yes Do you feel safe going back to the place where you live?: Yes      Need for Family Participation in Patient Care: Yes (Comment) Care giver support system in place?: Yes (comment) Current home services: DME Criminal Activity/Legal Involvement Pertinent to Current Situation/Hospitalization: No - Comment as needed  Activities of Daily Living Home Assistive Devices/Equipment: None ADL Screening (condition at time of admission) Patient's cognitive ability adequate to safely complete daily activities?: No Is the patient deaf or have difficulty hearing?: No Does the patient have difficulty seeing, even when wearing glasses/contacts?: No Does the patient have difficulty concentrating, remembering, or making decisions?: No Patient able to express  need for assistance with ADLs?: No Does the patient have difficulty dressing or bathing?: No Independently performs ADLs?: Yes (appropriate for developmental age) Does the patient have difficulty walking or climbing stairs?: No Weakness of Legs: Both Weakness of Arms/Hands: None  Permission Sought/Granted                  Emotional Assessment Appearance:: Appears stated age Attitude/Demeanor/Rapport: Engaged Affect (typically observed): Calm, Pleasant Orientation: : Oriented to Self, Oriented to Place, Oriented to  Time, Oriented to Situation Alcohol / Substance Use: Not Applicable Psych Involvement: No (comment)  Admission diagnosis:  Shortness of breath [R06.02] SOB (shortness of breath) [R06.02] Hypoxia [R09.02] COPD with acute exacerbation (HCC) [J44.1] HCAP (healthcare-associated pneumonia) [J18.9] Patient Active Problem List   Diagnosis Date Noted  . Acute and chronic respiratory failure (acute-on-chronic) ([place]) 08/10/2018  . Left lower lobe pneumonia ([place]) 08/10/2018  . Leukocytosis 08/10/2018  . Chronic respiratory failure with hypoxia ([place]) 06/28/2018  . Acute upper GI bleeding 01/20/2018  . Lower GI bleed 01/20/2018  . Acute respiratory failure with hypoxia ([place]) 09/17/2017  . Diabetes mellitus ([place]) 08/31/2017  . Acute hypoxemic respiratory failure ([place]) 08/31/2017  . Atrial fibrillation with RVR ([place]) 09/24/2016  . Atrial flutter ([place]) 09/23/2016  . Obstructive chronic bronchitis with exacerbation ([place]) 05/23/2016  . Acute on chronic respiratory failure ([place]) 03/24/2015  . Metabolic encephalopathy 99/83/3825  .  HCAP (healthcare-associated pneumonia) 03/24/2015  . Essential hypertension   . Anticoagulation management encounter   . New onset atrial flutter ([place]) 03/21/2015  . Obesity 03/21/2015  . Atrial flutter with rapid ventricular response ([place]) 03/21/2015  . COPD (chronic obstructive pulmonary disease) ([place]) 12/20/2014  . COPD exacerbation ([place])  12/20/2014  . DCIS (ductal carcinoma in situ) of breast, right, 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, Arimadex 03/10/2011   PCP:  Lavella Lemons, PA Pharmacy:   [place], [address] [place] [address] Phone: [phone] Fax: [phone]     Social Determinants of Health (SDOH) Interventions    Readmission Risk Interventions Readmission Risk Prevention Plan 08/12/2018 06/28/2018  Transportation Screening Complete Complete  Home Care Screening Complete Complete  Some recent data might be hidden

## 2018-08-12 NOTE — Progress Notes (Signed)
PROGRESS NOTE  Eileen [name]  ZOX:[id number]  DOB: [birth year]  DOA: 08/10/2018 PCP: Lavella Lemons, PA  Brief Admission Hx: 73 year old oxygen and steroid-dependent COPD presented with 3 days of progressive shortness of breath.  MDM/Assessment & Plan:   1. Acute on chronic respiratory failure-secondary to pneumonia.   Continue treating pneumonia aggressively and continue supportive therapy.  Continue IV steroid.  SARS coronavirus 2 was negative. 2. Chronic respiratory failure/severe COPD- she is on temporary IV steroids and resumed home bronchodilators. 3. Left basilar pneumonia- continue antibiotics.  We have de-escalated.  Vancomycin DC'd with negative MRSA screen.  Continue cefepime.  Continue supportive therapy. 4. Type 2 diabetes mellitus, insulin requiring with hyperglycemia- she is having some steroid induced hyperglycemia.  We have increased her prandial and sliding scale coverage.  Continue monitoring CBGs closely and further titrate insulin doses as needed. 5. Chronic atrial flutter- we have resumed home cardiac medications and full anticoagulation with Xarelto.  DVT prophylaxis: Xarelto Code Status: Full Family Communication: daughter telephone  Disposition Plan: transfer to telemetry   Consultants:    Procedures:    Antimicrobials:  Cefepime 5/30   Vancomycin 5/30-5/31   Subjective: Patient says she coughing and SOB but has had some improvement.    Objective: Vitals:   08/12/18 1320 08/12/18 1448 08/12/18 1449 08/12/18 1639  BP: 123/70 (!) 150/72    Pulse: 83 87    Resp: 18 (!) 22    Temp: 98.1 F (36.7 C)     TempSrc: Oral     SpO2: (!) 88% (!) 88% 92% 90%  Weight:      Height:        Intake/Output Summary (Last 24 hours) at 08/12/2018 1831 Last data filed at 08/12/2018 8119 Gross per 24 hour  Intake 200.89 ml  Output --  Net 200.89 ml   Filed Weights   08/10/18 1638 08/11/18 0555 08/12/18 0500  Weight: 79.7 kg 80.9 kg 80.9 kg   REVIEW OF  SYSTEMS  As per history otherwise all reviewed and reported negative  Exam:  General exam: awake, alert, NAD, cooperative.  Respiratory system: rales LLL with wheezing heard. Poor air movement.  No increased work of breathing. Cardiovascular system: normal S1 & S2 heard.  Gastrointestinal system: Abdomen is nondistended, soft and nontender. Normal bowel sounds heard. Central nervous system: Alert and oriented. No focal neurological deficits. Extremities: trace pretibial edema BLEs.  Data Reviewed: Basic Metabolic Panel: Recent Labs  Lab 08/10/18 1156 08/11/18 0359 08/12/18 0436  NA 140 143 139  K 4.4 4.8 5.1  CL 99 99 98  CO2 27 30 29   GLUCOSE 292* 279* 178*  BUN 17 15 20   CREATININE 0.87 0.71 0.58  CALCIUM 9.4 9.0 8.8*  MG  --  2.2 2.4   Liver Function Tests: Recent Labs  Lab 08/11/18 0359 08/12/18 0436  AST 18 20  ALT 22 26  ALKPHOS 46 46  BILITOT 0.6 0.5  PROT 6.4* 6.2*  ALBUMIN 3.8 3.6   No results for input(s): LIPASE, AMYLASE in the last 168 hours. No results for input(s): AMMONIA in the last 168 hours. CBC: Recent Labs  Lab 08/10/18 1156 08/11/18 0359 08/12/18 0436  WBC 14.1* 11.8* 16.0*  NEUTROABS 12.0* 10.8* 14.7*  HGB 12.5 11.2* 11.1*  HCT 41.6 38.6 38.2  MCV 98.6 102.1* 101.3*  PLT 252 238 242   Cardiac Enzymes: Recent Labs  Lab 08/10/18 1156  TROPONINI <0.03   CBG (last 3)  Recent Labs    08/12/18  4854 08/12/18 1122 08/12/18 1618  GLUCAP 168* 151* 82   Recent Results (from the past 240 hour(s))  SARS Coronavirus 2 (CEPHEID - Performed in [place] hospital lab), Hosp Order     Status: None   Collection Time: 08/10/18 11:56 AM  Result Value Ref Range Status   SARS Coronavirus 2 NEGATIVE NEGATIVE Final    Comment: (NOTE) If result is NEGATIVE SARS-CoV-2 target nucleic acids are NOT DETECTED. The SARS-CoV-2 RNA is generally detectable in upper and lower  respiratory specimens during the acute phase of infection. The lowest    concentration of SARS-CoV-2 viral copies this assay can detect is 250  copies / mL. A negative result does not preclude SARS-CoV-2 infection  and should not be used as the sole basis for treatment or other  patient management decisions.  A negative result may occur with  improper specimen collection / handling, submission of specimen other  than nasopharyngeal swab, presence of viral mutation(s) within the  areas targeted by this assay, and inadequate number of viral copies  (<250 copies / mL). A negative result must be combined with clinical  observations, patient history, and epidemiological information. If result is POSITIVE SARS-CoV-2 target nucleic acids are DETECTED. The SARS-CoV-2 RNA is generally detectable in upper and lower  respiratory specimens dur ing the acute phase of infection.  Positive  results are indicative of active infection with SARS-CoV-2.  Clinical  correlation with patient history and other diagnostic information is  necessary to determine patient infection status.  Positive results do  not rule out bacterial infection or co-infection with other viruses. If result is PRESUMPTIVE POSTIVE SARS-CoV-2 nucleic acids MAY BE PRESENT.   A presumptive positive result was obtained on the submitted specimen  and confirmed on repeat testing.  While 2019 novel coronavirus  (SARS-CoV-2) nucleic acids may be present in the submitted sample  additional confirmatory testing may be necessary for epidemiological  and / or clinical management purposes  to differentiate between  SARS-CoV-2 and other Sarbecovirus currently known to infect humans.  If clinically indicated additional testing with an alternate test  methodology [id number]) is advised. The SARS-CoV-2 RNA is generally  detectable in upper and lower respiratory sp ecimens during the acute  phase of infection. The expected result is Negative. Fact Sheet for Patients:  StrictlyIdeas.no Fact Sheet  for Healthcare Providers: BankingDealers.co.za This test is not yet approved or cleared by the [place] FDA and has been authorized for detection and/or diagnosis of SARS-CoV-2 by FDA under an Emergency Use Authorization (EUA).  This EUA will remain in effect (meaning this test can be used) for the duration of the COVID-19 declaration under Section 564(b)(1) of the Act, 21 U.S.C. section 360bbb-3(b)(1), unless the authorization is terminated or revoked sooner. Performed at [facility], [address], [place], [place] [address]   Culture, blood (routine x 2) Call MD if unable to obtain prior to antibiotics being given     Status: None (Preliminary result)   Collection Time: 08/10/18  3:19 PM  Result Value Ref Range Status   Specimen Description BLOOD LEFT ARM  Final   Special Requests   Final    BOTTLES DRAWN AEROBIC AND ANAEROBIC Blood Culture adequate volume   Culture   Final    NO GROWTH 2 DAYS Performed at [facility], [address], [place], [place] [address]    Report Status PENDING  Incomplete  Culture, blood (routine x 2) Call MD if unable to obtain prior to antibiotics being  given     Status: None (Preliminary result)   Collection Time: 08/10/18  3:28 PM  Result Value Ref Range Status   Specimen Description BLOOD LEFT WRIST  Final   Special Requests   Final    BOTTLES DRAWN AEROBIC AND ANAEROBIC Blood Culture adequate volume   Culture   Final    NO GROWTH 2 DAYS Performed at [facility], [address] [place], [place] [address]    Report Status PENDING  Incomplete  MRSA PCR Screening     Status: None   Collection Time: 08/10/18  4:19 PM  Result Value Ref Range Status   MRSA by PCR NEGATIVE NEGATIVE Final    Comment:        The GeneXpert MRSA Assay (FDA approved for NASAL specimens only), is one component of a comprehensive MRSA colonization surveillance program. It is not intended to diagnose MRSA infection nor to guide or monitor  treatment for MRSA infections. Performed at [facility], [address] [place], [place] [address]      Studies: No results found. Scheduled Meds:  amiodarone  200 mg Oral Daily   fluticasone  2 spray Each Nare Daily   fluticasone furoate-vilanterol  1 puff Inhalation Daily   furosemide  20 mg Oral Daily   guaiFENesin  1,200 mg Oral BID   insulin aspart  0-20 Units Subcutaneous TID WC   insulin aspart  0-5 Units Subcutaneous QHS   insulin aspart  10 Units Subcutaneous TID WC   ipratropium-albuterol  3 mL Nebulization QID   methylPREDNISolone (SOLU-MEDROL) injection  40 mg Intravenous Q6H   metoprolol tartrate  50 mg Oral BID   pneumococcal 23 valent vaccine  0.5 mL Intramuscular Tomorrow-1000   rivaroxaban  20 mg Oral Q supper   Continuous Infusions:  ceFEPime (MAXIPIME) IV 2 g (08/12/18 1455)    Principal Problem:   Acute and chronic respiratory failure (acute-on-chronic) (HCC) Active Problems:   COPD (chronic obstructive pulmonary disease) (HCC)   Essential hypertension   Atrial flutter (HCC)   Diabetes mellitus ([place])   Chronic respiratory failure with hypoxia ([place])   Left lower lobe pneumonia ([place])   Leukocytosis   Irwin Brakeman, MD [facility] 08/12/2018, 6:31 PM    LOS: 2 days  How to contact the [facility] Attending or Consulting provider [place] or covering provider during after hours [place], for this patient?  1. Check the care team in [facility] and look for a) attending/consulting TRH provider listed and b) the [facility] team listed 2. Log into [email or web address] and use [place]'s universal password to access. If you do not have the password, please contact the hospital operator. 3. Locate the [facility] provider you are looking for under [facility] and page to a number that you can be directly reached. 4. If you still have difficulty reaching the provider, please page the [facility] (Director on Call) for the Hospitalists listed on amion for assistance.

## 2018-08-13 ENCOUNTER — Inpatient Hospital Stay (HOSPITAL_COMMUNITY): Payer: Medicare Other

## 2018-08-13 LAB — GLUCOSE, CAPILLARY
Glucose-Capillary: 202 mg/dL — ABNORMAL HIGH (ref 70–99)
Glucose-Capillary: 212 mg/dL — ABNORMAL HIGH (ref 70–99)
Glucose-Capillary: 225 mg/dL — ABNORMAL HIGH (ref 70–99)
Glucose-Capillary: 131 mg/dL — ABNORMAL HIGH (ref 70–99)
Glucose-Capillary: 47 mg/dL — ABNORMAL LOW (ref 70–99)
Glucose-Capillary: 173 mg/dL — ABNORMAL HIGH (ref 70–99)

## 2018-08-13 LAB — COMPREHENSIVE METABOLIC PANEL
ALT: 37 U/L (ref 0–44)
AST: 29 U/L (ref 15–41)
Albumin: 3.7 g/dL (ref 3.5–5.0)
Alkaline Phosphatase: 51 U/L (ref 38–126)
Anion gap: 10 (ref 5–15)
BUN: 28 mg/dL — ABNORMAL HIGH (ref 8–23)
CO2: 34 mmol/L — ABNORMAL HIGH (ref 22–32)
Calcium: 8.9 mg/dL (ref 8.9–10.3)
Chloride: 97 mmol/L — ABNORMAL LOW (ref 98–111)
Creatinine, Ser: 0.64 mg/dL (ref 0.44–1.00)
GFR calc Af Amer: >60 mL/min (ref >60)
GFR calc non Af Amer: >60 mL/min (ref >60)
Glucose, Bld: 243 mg/dL — ABNORMAL HIGH (ref 70–99)
Potassium: 5.2 mmol/L — ABNORMAL HIGH (ref 3.5–5.1)
Sodium: 141 mmol/L (ref 135–145)
Total Bilirubin: 0.5 mg/dL (ref 0.3–1.2)
Total Protein: 6.1 g/dL — ABNORMAL LOW (ref 6.5–8.1)

## 2018-08-13 LAB — URINALYSIS, ROUTINE W REFLEX MICROSCOPIC
Bilirubin Urine: NEGATIVE
Glucose, UA: NEGATIVE mg/dL
Ketones, ur: NEGATIVE mg/dL
Nitrite: NEGATIVE
Protein, ur: NEGATIVE mg/dL
RBC / HPF: >50 RBC/hpf — ABNORMAL HIGH (ref 0–5)
Specific Gravity, Urine: 1.02 (ref 1.005–1.030)
pH: 5 (ref 5.0–8.0)

## 2018-08-13 LAB — CBC WITH DIFFERENTIAL/PLATELET
Abs Immature Granulocytes: 0.07 10*3/uL (ref 0.00–0.07)
Basophils Absolute: 0 10*3/uL (ref 0.0–0.1)
Basophils Relative: 0 %
Eosinophils Absolute: 0 10*3/uL (ref 0.0–0.5)
Eosinophils Relative: 0 %
HCT: 37.7 % (ref 36.0–46.0)
Hemoglobin: 11 g/dL — ABNORMAL LOW (ref 12.0–15.0)
Immature Granulocytes: 1 %
Lymphocytes Relative: 3 %
Lymphs Abs: 0.4 10*3/uL — ABNORMAL LOW (ref 0.7–4.0)
MCH: 29.3 pg (ref 26.0–34.0)
MCHC: 29.2 g/dL — ABNORMAL LOW (ref 30.0–36.0)
MCV: 100.3 fL — ABNORMAL HIGH (ref 80.0–100.0)
Monocytes Absolute: 0.7 10*3/uL (ref 0.1–1.0)
Monocytes Relative: 5 %
Neutro Abs: 12.8 10*3/uL — ABNORMAL HIGH (ref 1.7–7.7)
Neutrophils Relative %: 91 %
Platelets: 256 10*3/uL (ref 150–400)
RBC: 3.76 MIL/uL — ABNORMAL LOW (ref 3.87–5.11)
RDW: 13.9 % (ref 11.5–15.5)
WBC: 13.8 10*3/uL — ABNORMAL HIGH (ref 4.0–10.5)
nRBC: 0.2 % (ref 0.0–0.2)

## 2018-08-13 LAB — MAGNESIUM: Magnesium: 2.4 mg/dL (ref 1.7–2.4)

## 2018-08-13 MED ORDER — SENNOSIDES-DOCUSATE SODIUM 8.6-50 MG PO TABS
1.0000 | ORAL_TABLET | Freq: Two times a day (BID) | ORAL | Status: DC
  Administered 2018-08-13 – 2018-08-19 (×13): 1 via ORAL
  Filled 2018-08-13 (×14): qty 1

## 2018-08-13 MED ORDER — DEXTROSE 50 % IV SOLN
25.0000 g | INTRAVENOUS | Status: AC
  Administered 2018-08-13: 22:00:00 25 g via INTRAVENOUS

## 2018-08-13 MED ORDER — INSULIN ASPART 100 UNIT/ML ~~LOC~~ SOLN
0.0000 [IU] | Freq: Three times a day (TID) | SUBCUTANEOUS | Status: DC
  Administered 2018-08-13: 2 [IU] via SUBCUTANEOUS
  Administered 2018-08-14: 3 [IU] via SUBCUTANEOUS

## 2018-08-13 MED ORDER — DEXTROSE 50 % IV SOLN
INTRAVENOUS | Status: AC
  Administered 2018-08-13: 22:00:00 25 g via INTRAVENOUS
  Filled 2018-08-13: qty 50

## 2018-08-13 MED ORDER — LORAZEPAM 2 MG/ML IJ SOLN
1.0000 mg | Freq: Once | INTRAMUSCULAR | Status: AC
  Administered 2018-08-13: 1 mg via INTRAVENOUS
  Filled 2018-08-13: qty 1

## 2018-08-13 MED ORDER — INSULIN ASPART 100 UNIT/ML ~~LOC~~ SOLN
0.0000 [IU] | Freq: Every day | SUBCUTANEOUS | Status: DC
  Administered 2018-08-15 – 2018-08-16 (×2): 3 [IU] via SUBCUTANEOUS

## 2018-08-13 MED ORDER — METHYLPREDNISOLONE SODIUM SUCC 40 MG IJ SOLR
40.0000 mg | Freq: Two times a day (BID) | INTRAMUSCULAR | Status: DC
  Administered 2018-08-13 – 2018-08-15 (×4): 40 mg via INTRAVENOUS
  Filled 2018-08-13 (×4): qty 1

## 2018-08-13 MED ORDER — FUROSEMIDE 10 MG/ML IJ SOLN
40.0000 mg | Freq: Every day | INTRAMUSCULAR | Status: DC
  Administered 2018-08-13 – 2018-08-17 (×5): 40 mg via INTRAVENOUS
  Filled 2018-08-13 (×6): qty 4

## 2018-08-13 MED ORDER — SORBITOL 70 % SOLN
960.0000 mL | TOPICAL_OIL | Freq: Once | ORAL | Status: AC
  Administered 2018-08-13: 960 mL via RECTAL
  Filled 2018-08-13: qty 473

## 2018-08-13 MED ORDER — SODIUM ZIRCONIUM CYCLOSILICATE 10 G PO PACK
10.0000 g | PACK | Freq: Three times a day (TID) | ORAL | Status: AC
  Administered 2018-08-13 (×2): 10 g via ORAL
  Filled 2018-08-13 (×2): qty 1

## 2018-08-13 NOTE — Progress Notes (Signed)
O2 at 4L via N/C. Stated decrease in SOB. Confusion remains unchanged. Dr. Wynetta Emery notified. Report called to Lilia Pro, Therapist, sports. Transferring via bed to Stepdown. Stable.

## 2018-08-13 NOTE — Progress Notes (Addendum)
PROGRESS NOTE  Eileen [name]  LNL:[id number]  DOB: 1945/07/26  DOA: 08/10/2018 PCP: Lavella Lemons, PA  Brief Admission Hx: 73 year old oxygen and steroid-dependent COPD presented with 3 days of progressive shortness of breath.  MDM/Assessment & Plan:   1. Acute on chronic respiratory failure-exacerbated by pneumonia.   Continue treating pneumonia with cefepime and continue supportive therapy.  Continue IV steroids.  SARS coronavirus 2 was negative.  Pt having higher oxygen requirement and will transfer to stepdown ICU. Pt now on 7L [place].  2. Chronic respiratory failure/severe steroid and oxygen-dependent COPD- she is on temporary IV steroids and resumed home bronchodilators. 3. Left basilar pneumonia- continue antibiotics.  Vancomycin DC'd with negative MRSA screen.  Continue cefepime.  Continue supportive therapy. 4. Abdominal distension - I got an acute abdominal series that is showing large amount of stool in colon.  Ordered enema and laxatives.  5. Type 2 diabetes mellitus, insulin requiring with hyperglycemia- she is having some steroid induced hyperglycemia.  We have increased her prandial and sliding scale coverage.  Continue monitoring CBGs closely and further titrate insulin doses as needed. 6. Chronic atrial flutter- we have resumed home cardiac medications and full anticoagulation with Xarelto.  DVT prophylaxis: Xarelto Code Status: Full Family Communication: daughter telephone update 6/2 Disposition Plan: transfer to stepdown ICU  Consultants:    Procedures:    Antimicrobials:  Cefepime 5/30   Vancomycin 5/30-5/31   Subjective: Patient more confused and SOB today.  Pt also reporting that her SOB has increased.  Pt not eating as well today.  Pt denies chest pain.    Objective: Vitals:   08/13/18 0520 08/13/18 0750 08/13/18 0756 08/13/18 1125  BP: 106/68     Pulse: 71     Resp: 16     Temp: 97.8 F (36.6 C)     TempSrc:      SpO2: 100% 97% 97% 94%   Weight:      Height:        Intake/Output Summary (Last 24 hours) at 08/13/2018 1309 Last data filed at 08/13/2018 4081 Gross per 24 hour  Intake 632.09 ml  Output 450 ml  Net 182.09 ml   Filed Weights   08/10/18 1638 08/11/18 0555 08/12/18 0500  Weight: 79.7 kg 80.9 kg 80.9 kg   REVIEW OF SYSTEMS  As per history otherwise all reviewed and reported negative  Exam:  General exam: awake, alert, NAD, cooperative.  Respiratory system: rales LLL with wheezing heard. Poor air movement.  No increased work of breathing. Cardiovascular system: normal S1 & S2 heard.  Gastrointestinal system: Abdomen is nondistended, soft and nontender. Normal bowel sounds heard. Central nervous system: Alert and oriented x 2. No focal neurological deficits. Extremities: trace pretibial edema BLEs.  Data Reviewed: Basic Metabolic Panel: Recent Labs  Lab 08/10/18 1156 08/11/18 0359 08/12/18 0436 08/13/18 0506  NA 140 143 139 141  K 4.4 4.8 5.1 5.2*  CL 99 99 98 97*  CO2 27 30 29  34*  GLUCOSE 292* 279* 178* 243*  BUN 17 15 20  28*  CREATININE 0.87 0.71 0.58 0.64  CALCIUM 9.4 9.0 8.8* 8.9  MG  --  2.2 2.4 2.4   Liver Function Tests: Recent Labs  Lab 08/11/18 0359 08/12/18 0436 08/13/18 0506  AST 18 20 29   ALT 22 26 37  ALKPHOS 46 46 51  BILITOT 0.6 0.5 0.5  PROT 6.4* 6.2* 6.1*  ALBUMIN 3.8 3.6 3.7   No results for input(s): LIPASE, AMYLASE in the last  168 hours. No results for input(s): AMMONIA in the last 168 hours. CBC: Recent Labs  Lab 08/10/18 1156 08/11/18 0359 08/12/18 0436 08/13/18 0506  WBC 14.1* 11.8* 16.0* 13.8*  NEUTROABS 12.0* 10.8* 14.7* 12.8*  HGB 12.5 11.2* 11.1* 11.0*  HCT 41.6 38.6 38.2 37.7  MCV 98.6 102.1* 101.3* 100.3*  PLT 252 238 242 256   Cardiac Enzymes: Recent Labs  Lab 08/10/18 1156  TROPONINI <0.03   CBG (last 3)  Recent Labs    08/13/18 0310 08/13/18 0726 08/13/18 1107  GLUCAP 202* 212* 225*   Recent Results (from the past 240 hour(s))   SARS Coronavirus 2 (CEPHEID - Performed in [place] hospital lab), Hosp Order     Status: None   Collection Time: 08/10/18 11:56 AM  Result Value Ref Range Status   SARS Coronavirus 2 NEGATIVE NEGATIVE Final    Comment: (NOTE) If result is NEGATIVE SARS-CoV-2 target nucleic acids are NOT DETECTED. The SARS-CoV-2 RNA is generally detectable in upper and lower  respiratory specimens during the acute phase of infection. The lowest  concentration of SARS-CoV-2 viral copies this assay can detect is 250  copies / mL. A negative result does not preclude SARS-CoV-2 infection  and should not be used as the sole basis for treatment or other  patient management decisions.  A negative result may occur with  improper specimen collection / handling, submission of specimen other  than nasopharyngeal swab, presence of viral mutation(s) within the  areas targeted by this assay, and inadequate number of viral copies  (<250 copies / mL). A negative result must be combined with clinical  observations, patient history, and epidemiological information. If result is POSITIVE SARS-CoV-2 target nucleic acids are DETECTED. The SARS-CoV-2 RNA is generally detectable in upper and lower  respiratory specimens dur ing the acute phase of infection.  Positive  results are indicative of active infection with SARS-CoV-2.  Clinical  correlation with patient history and other diagnostic information is  necessary to determine patient infection status.  Positive results do  not rule out bacterial infection or co-infection with other viruses. If result is PRESUMPTIVE POSTIVE SARS-CoV-2 nucleic acids MAY BE PRESENT.   A presumptive positive result was obtained on the submitted specimen  and confirmed on repeat testing.  While 2019 novel coronavirus  (SARS-CoV-2) nucleic acids may be present in the submitted sample  additional confirmatory testing may be necessary for epidemiological  and / or clinical management  purposes  to differentiate between  SARS-CoV-2 and other Sarbecovirus currently known to infect humans.  If clinically indicated additional testing with an alternate test  methodology [phone]) is advised. The SARS-CoV-2 RNA is generally  detectable in upper and lower respiratory sp ecimens during the acute  phase of infection. The expected result is Negative. Fact Sheet for Patients:  StrictlyIdeas.no Fact Sheet for Healthcare Providers: BankingDealers.co.za This test is not yet approved or cleared by the [place] FDA and has been authorized for detection and/or diagnosis of SARS-CoV-2 by FDA under an Emergency Use Authorization (EUA).  This EUA will remain in effect (meaning this test can be used) for the duration of the COVID-19 declaration under Section 564(b)(1) of the Act, 21 U.S.C. section 360bbb-3(b)(1), unless the authorization is terminated or revoked sooner. Performed at [facility], [address] [place], [place] [address]   Culture, blood (routine x 2) Call MD if unable to obtain prior to antibiotics being given     Status: None (Preliminary result)   Collection Time: 08/10/18  3:19 PM  Result Value Ref Range Status   Specimen Description BLOOD LEFT ARM  Final   Special Requests   Final    BOTTLES DRAWN AEROBIC AND ANAEROBIC Blood Culture adequate volume   Culture   Final    NO GROWTH 3 DAYS Performed at [facility], [address] [place], [place] [address]    Report Status PENDING  Incomplete  Culture, blood (routine x 2) Call MD if unable to obtain prior to antibiotics being given     Status: None (Preliminary result)   Collection Time: 08/10/18  3:28 PM  Result Value Ref Range Status   Specimen Description BLOOD LEFT WRIST  Final   Special Requests   Final    BOTTLES DRAWN AEROBIC AND ANAEROBIC Blood Culture adequate volume   Culture   Final    NO GROWTH 3 DAYS Performed at [facility], [address],  [place], [place] [address]    Report Status PENDING  Incomplete  MRSA PCR Screening     Status: None   Collection Time: 08/10/18  4:19 PM  Result Value Ref Range Status   MRSA by PCR NEGATIVE NEGATIVE Final    Comment:        The GeneXpert MRSA Assay (FDA approved for NASAL specimens only), is one component of a comprehensive MRSA colonization surveillance program. It is not intended to diagnose MRSA infection nor to guide or monitor treatment for MRSA infections. Performed at [facility], [address], [place], [place] [address]      Studies: Dg Abd Acute 2+v W 1v Chest  Result Date: 08/13/2018 CLINICAL DATA:  Confusion, abdominal distension EXAM: DG ABDOMEN ACUTE W/ 1V CHEST COMPARISON:  Chest radiograph 08/10/2018 FINDINGS: Enlargement of cardiac silhouette. Mediastinal contours and pulmonary vascularity normal. Lordotic positioning. Bibasilar atelectasis and questionable small bibasilar effusions. No definite infiltrate or pneumothorax. Bones demineralized. Nonobstructive bowel gas pattern. Increased stool in RIGHT colon. No bowel dilatation, bowel wall thickening or free air. No definite urinary tract calcifications. IMPRESSION: Nonobstructive bowel gas pattern with increased stool in RIGHT colon. Enlargement of cardiac silhouette. Bibasilar atelectasis and questionable small pleural effusions. Electronically Signed   By: Lavonia Dana M.D.   On: 08/13/2018 10:10   Scheduled Meds: . amiodarone  200 mg Oral Daily  . fluticasone  2 spray Each Nare Daily  . fluticasone furoate-vilanterol  1 puff Inhalation Daily  . furosemide  40 mg Intravenous Daily  . guaiFENesin  1,200 mg Oral BID  . insulin aspart  0-15 Units Subcutaneous TID WC  . insulin aspart  0-5 Units Subcutaneous QHS  . insulin aspart  10 Units Subcutaneous TID WC  . ipratropium-albuterol  3 mL Nebulization QID  . methylPREDNISolone (SOLU-MEDROL) injection  40 mg Intravenous Q12H  . metoprolol tartrate  50 mg Oral BID  .  pneumococcal 23 valent vaccine  0.5 mL Intramuscular Tomorrow-1000  . rivaroxaban  20 mg Oral Q supper  . sodium zirconium cyclosilicate  10 g Oral TID  . sorbitol, milk of mag, mineral oil, glycerin (SMOG) enema  960 mL Rectal Once   Continuous Infusions: . sodium chloride 10 mL/hr at 08/13/18 0512  . ceFEPime (MAXIPIME) IV 2 g (08/13/18 0536)    Principal Problem:   Acute and chronic respiratory failure (acute-on-chronic) (HCC) Active Problems:   COPD (chronic obstructive pulmonary disease) (HCC)   Essential hypertension   Atrial flutter (HCC)   Diabetes mellitus ([place])   Chronic respiratory failure with hypoxia (HCC)   Left lower lobe  pneumonia ([place])   Leukocytosis  Critical Care time spent 32 minutes   Irwin Brakeman, MD [facility] 08/13/2018, 1:09 PM    LOS: 3 days  How to contact the [facility] Attending or Consulting provider [place] or covering provider during after hours [place], for this patient?  1. Check the care team in [facility] and look for a) attending/consulting TR[facility] provider listed and b) the [facility] team listed 2. Log into [email or web address] and use [place] universal password to access. If you do not have the password, please contact the hospital operator. 3. Locate the [facility] provider you are looking for under [facility] and page to a number that you can be directly reached. 4. If you still have difficulty reaching the provider, please page the [facility] (Director on Call) for the Hospitalists listed on amion for assistance.

## 2018-08-13 NOTE — Progress Notes (Signed)
At the start of shift patient was conversing on the telephone. Pt was asleep shortly after. Pt regularly scheduled glucose check resulted 47. Pt would not readily awaken. Pt was given Dextrose IV and became awake and talkative almost immediately Pt was given a snack and caloric liquid (milk). CBG recheck after an hour and a half is 173 Pt is able to communicate readily and awakes easily.

## 2018-08-13 NOTE — Progress Notes (Signed)
Inpatient Diabetes Program Recommendations  AACE/ADA: New Consensus Statement on Inpatient Glycemic Control (2015)  Target Ranges:  Prepandial:   less than 140 mg/dL      Peak postprandial:   less than 180 mg/dL (1-2 hours)      Critically ill patients:  140 - 180 mg/dL   Lab Results  Component Value Date   GLUCAP 212 (H) 08/13/2018   HGBA1C 8.6 (H) 06/24/2018    Review of Glycemic Control Results for Eileen [name], Eileen [name] (MRN [id number]) as of 08/13/2018 10:49  Ref. Range 08/12/2018 11:22 08/12/2018 16:18 08/12/2018 21:33 08/13/2018 03:10 08/13/2018 07:26  Glucose-Capillary Latest Ref Range: 70 - 99 mg/dL 151 (H) 82 229 (H) 202 (H) 212 (H)   Diabetes history: DM2 Outpatient Diabetes medications: Metformin 500 mg qd Current orders for Inpatient glycemic control: Novolog 10 units tid meal coverage if eats 50% + Novolog resistant scale tid + hs 0-5 units  Inpatient Diabetes Program Recommendations:   -Please consider decrease in Novolog correction to moderate tid + hs 0-5  Thank you, Bethena Roys E. Andrea Colglazier, RN, MSN, CDE  Diabetes Coordinator Inpatient Glycemic Control Team Team Pager [phone] (8am-5pm) 08/13/2018 10:52 AM

## 2018-08-13 NOTE — Progress Notes (Signed)
RN paged Dr. Darrick Meigs to make him aware patient is very confused and agitated, continually trying to get OOB, inquired if patient could have some medication to help her rest, awaiting response.  P.J. Linus Mako, RN

## 2018-08-14 LAB — GLUCOSE, CAPILLARY
Glucose-Capillary: 166 mg/dL — ABNORMAL HIGH (ref 70–99)
Glucose-Capillary: 157 mg/dL — ABNORMAL HIGH (ref 70–99)
Glucose-Capillary: 84 mg/dL (ref 70–99)
Glucose-Capillary: 173 mg/dL — ABNORMAL HIGH (ref 70–99)

## 2018-08-14 LAB — BLOOD GAS, ARTERIAL
Acid-Base Excess: 18.8 mmol/L — ABNORMAL HIGH (ref 0.0–2.0)
Bicarbonate: 39.9 mmol/L — ABNORMAL HIGH (ref 20.0–28.0)
FIO2: 32
O2 Saturation: 89 %
Patient temperature: 36.7
pCO2 arterial: 87.8 mmHg (ref 32.0–48.0)
pH, Arterial: 7.335 — ABNORMAL LOW (ref 7.350–7.450)
pO2, Arterial: 62.2 mmHg — ABNORMAL LOW (ref 83.0–108.0)

## 2018-08-14 LAB — STREP PNEUMONIAE URINARY ANTIGEN: Strep Pneumo Urinary Antigen: NEGATIVE

## 2018-08-14 MED ORDER — CHLORHEXIDINE GLUCONATE 0.12 % MT SOLN
15.0000 mL | Freq: Two times a day (BID) | OROMUCOSAL | Status: DC
  Administered 2018-08-14 – 2018-08-19 (×9): 15 mL via OROMUCOSAL
  Filled 2018-08-14 (×9): qty 15

## 2018-08-14 MED ORDER — ORAL CARE MOUTH RINSE
15.0000 mL | Freq: Two times a day (BID) | OROMUCOSAL | Status: DC
  Administered 2018-08-14 – 2018-08-19 (×8): 15 mL via OROMUCOSAL

## 2018-08-14 MED ORDER — INSULIN ASPART 100 UNIT/ML ~~LOC~~ SOLN
0.0000 [IU] | Freq: Three times a day (TID) | SUBCUTANEOUS | Status: DC
  Administered 2018-08-14: 2 [IU] via SUBCUTANEOUS
  Administered 2018-08-15: 17:00:00 3 [IU] via SUBCUTANEOUS
  Administered 2018-08-15: 09:00:00 2 [IU] via SUBCUTANEOUS
  Administered 2018-08-16: 3 [IU] via SUBCUTANEOUS
  Administered 2018-08-16: 9 [IU] via SUBCUTANEOUS
  Administered 2018-08-16: 17:00:00 1 [IU] via SUBCUTANEOUS
  Administered 2018-08-17: 5 [IU] via SUBCUTANEOUS
  Administered 2018-08-17: 2 [IU] via SUBCUTANEOUS
  Administered 2018-08-17: 3 [IU] via SUBCUTANEOUS
  Administered 2018-08-18: 12:00:00 5 [IU] via SUBCUTANEOUS
  Administered 2018-08-18: 3 [IU] via SUBCUTANEOUS
  Administered 2018-08-19: 1 [IU] via SUBCUTANEOUS
  Administered 2018-08-19: 12:00:00 3 [IU] via SUBCUTANEOUS

## 2018-08-14 MED ORDER — INSULIN ASPART 100 UNIT/ML ~~LOC~~ SOLN
7.0000 [IU] | Freq: Three times a day (TID) | SUBCUTANEOUS | Status: DC
  Administered 2018-08-14 – 2018-08-16 (×5): 7 [IU] via SUBCUTANEOUS

## 2018-08-14 NOTE — Progress Notes (Signed)
Inpatient Diabetes Program Recommendations  AACE/ADA: New Consensus Statement on Inpatient Glycemic Control (2015)  Target Ranges:  Prepandial:   less than 140 mg/dL      Peak postprandial:   less than 180 mg/dL (1-2 hours)      Critically ill patients:  140 - 180 mg/dL   Lab Results  Component Value Date   GLUCAP 166 (H) 08/14/2018   HGBA1C 8.6 (H) 06/24/2018    Review of Glycemic Control Results for Eileen [name], Eileen [name] (MRN [id number]) as of 08/14/2018 10:03  Ref. Range 08/13/2018 11:07 08/13/2018 16:09 08/13/2018 21:22 08/13/2018 22:57 08/14/2018 08:08  Glucose-Capillary Latest Ref Range: 70 - 99 mg/dL 225 (H) Novolog 7 units 131 (H) Novolog 12 units 47 (L) 173 (H) 166 (H)   Diabetes history: DM2 Outpatient Diabetes medications: Metformin 500 mg qd Current orders for Inpatient glycemic control: Novolog 10 units tid meal coverage if eats 50% + Novolog moderate scale tid + hs 0-5 units  Inpatient Diabetes Program Recommendations:   Noted hypoglycemia of 47 post meal coverage + correction  -Decrease Novolog correction to sensitive -Decrease Novolog meal coverage to 7 units tid  Thank you, Nani Gasser. Kennidy Lamke, RN, MSN, CDE  Diabetes Coordinator Inpatient Glycemic Control Team Team Pager [phone] (8am-5pm) 08/14/2018 10:19 AM

## 2018-08-14 NOTE — Progress Notes (Signed)
CRITICAL VALUE ALERT  Critical Value:  PC02 87.8  Date & Time Notied:  08/14/2018 1416  Provider Notified: Heath Lark DO   Orders Received/Actions taken: Bipap, see chart

## 2018-08-14 NOTE — Progress Notes (Signed)
Was the fall witnessed: No  Patient condition before and after the fall: Patient alert and oriented but had some confusion and is still the same way post fall   Patient's reaction to the fall: Okay no pain nothing hurt just maybe a little nervous   Name of the doctor that was notified including date and time: Heath Lark DO 08/14/18 1252  Any interventions and vital signs: Arterial blood gas drawn, stable vital signs

## 2018-08-14 NOTE — Evaluation (Signed)
Physical Therapy Evaluation Patient Details Name: Eileen [name] MRN: [id number] DOB: [birth year] Today's Date: 08/14/2018   History of Present Illness  Eileen A Bogen is a 73 y.o. female with steroid-dependent and chronic oxygen requiring COPD reports that for the past 3 days she has been having increasing shortness of breath.  She had increased her home oxygen and nebulizer treatments for the past couple of days and initially had some relief but continues to have increasing shortness of breath prompting her to come to the emergency department.  She denies chest pain.  She denies fever and chills.  She reports that she has had a mostly nonproductive cough.  She has been wheezing more than normal.  She denies any known sick contacts.  No known exposure to COVID.    Clinical Impression  Patient has difficulty sitting up at bedside due to weakness, limited to a few steps at bedside due to frequent buckling of knees with near loss of balance, on 4 LPM O2 when standing taking steps with RW with SpO2 at 92-95%.  Patient unsafe to attempt ambulation away from bedside due to fall risk and tolerated sitting up in chair after therapy - RN notified.  Patient will benefit from continued physical therapy in hospital and recommended venue below to increase strength, balance, endurance for safe ADLs and gait.    Follow Up Recommendations SNF;Supervision/Assistance - 24 hour;Supervision for mobility/OOB    Equipment Recommendations  Rolling walker with 5" wheels    Recommendations for Other Services       Precautions / Restrictions Precautions Precautions: Fall Restrictions Weight Bearing Restrictions: No      Mobility  Bed Mobility Overal bed mobility: Needs Assistance Bed Mobility: Supine to Sit     Supine to sit: Min assist     General bed mobility comments: slow labored movement  Transfers Overall transfer level: Needs assistance Equipment used: None;Rolling walker (2  wheeled) Transfers: Sit to/from [facility] Sit to Stand: Min assist Stand pivot transfers: Min assist;Mod assist       General transfer comment: frequent buckling of knees due to weakness, required use of RW for safety  Ambulation/Gait Ambulation/Gait assistance: Mod assist Gait Distance (Feet): 4 Feet Assistive device: Rolling walker (2 wheeled) Gait Pattern/deviations: Decreased step length - right;Decreased step length - left;Decreased stride length Gait velocity: slow   General Gait Details: limited to 4-5 steps at bedside and approximately 1-2 minutes of marching in place due to BLE weakness with frequent buckling of knees resulting in near falls  Stairs            Wheelchair Mobility    Modified Rankin (Stroke Patients Only)       Balance Overall balance assessment: Needs assistance Sitting-balance support: Feet supported;No upper extremity supported Sitting balance-Leahy Scale: Fair     Standing balance support: During functional activity;No upper extremity supported Standing balance-Leahy Scale: Poor Standing balance comment: fair/poor using RW due to knees buckling                             Pertinent Vitals/Pain Pain Assessment: No/denies pain    Home Living Family/patient expects to be discharged to:: Private residence Living Arrangements: Children;Other relatives Available Help at Discharge: Family Type of Home: House Home Access: Stairs to enter Entrance Stairs-Rails: Right;Left;Can reach both Entrance Stairs-Number of Steps: 3 Home Layout: One level Home Equipment: None      Prior Function Level of Independence: Independent  Comments: Hydrographic surveyor, drives     Hand Dominance   Dominant Hand: Right    Extremity/Trunk Assessment   Upper Extremity Assessment Upper Extremity Assessment: Generalized weakness    Lower Extremity Assessment Lower Extremity Assessment: Generalized weakness     Cervical / Trunk Assessment Cervical / Trunk Assessment: Normal  Communication   Communication: No difficulties  Cognition Arousal/Alertness: Awake/alert Behavior During Therapy: WFL for tasks assessed/performed Overall Cognitive Status: Within Functional Limits for tasks assessed                                        General Comments      Exercises     Assessment/Plan    PT Assessment Patient needs continued PT services  PT Problem List Decreased strength;Decreased activity tolerance;Decreased mobility;Decreased balance       PT Treatment Interventions Therapeutic exercise;Gait training;Stair training;Functional mobility training;Therapeutic activities;Patient/family education    PT Goals (Current goals can be found in the Care Plan section)  Acute Rehab PT Goals Patient Stated Goal: return home PT Goal Formulation: With patient Time For Goal Achievement: 08/28/18 Potential to Achieve Goals: Good    Frequency Min 3X/week   Barriers to discharge        Co-evaluation               AM-PAC PT "6 Clicks" Mobility  Outcome Measure Help needed turning from your back to your side while in a flat bed without using bedrails?: A Little Help needed moving from lying on your back to sitting on the side of a flat bed without using bedrails?: A Lot Help needed moving to and from a bed to a chair (including a wheelchair)?: A Lot Help needed standing up from a chair using your arms (e.g., wheelchair or bedside chair)?: A Lot Help needed to walk in hospital room?: A Lot Help needed climbing 3-5 steps with a railing? : Total 6 Click Score: 12    End of Session Equipment Utilized During Treatment: Gait belt Activity Tolerance: Patient tolerated treatment well;Patient limited by fatigue Patient left: in chair;with call bell/phone within reach Nurse Communication: Mobility status PT Visit Diagnosis: Unsteadiness on feet (R26.81);Other abnormalities of gait  and mobility (R26.89);Muscle weakness (generalized) (M62.81)    Time: 4332-9518 PT Time Calculation (min) (ACUTE ONLY): 32 min   Charges:   PT Evaluation $PT Eval Moderate Complexity: 1 Mod PT Treatments $Therapeutic Activity: 23-37 mins        12:27 PM, 08/14/18 Lonell Grandchild, MPT Physical Therapist with [facility] [phone] office [phone] mobile phone

## 2018-08-14 NOTE — Progress Notes (Signed)
PROGRESS NOTE    Eileen [name]  NLG:[id number] DOB: 01-28-1946 DOA: 08/10/2018 PCP: Lavella Lemons, PA   Brief Narrative:  73 year old oxygen and steroid-dependent COPD presented with 3 days of progressive shortness of breath.  She was more confused and short of breath on 6/2 prompting transfer to stepdown unit for closer monitoring.  Assessment & Plan:   Principal Problem:   Acute and chronic respiratory failure (acute-on-chronic) (HCC) Active Problems:   COPD (chronic obstructive pulmonary disease) (HCC)   Essential hypertension   Atrial flutter (HCC)   Diabetes mellitus ([place])   Chronic respiratory failure with hypoxia (HCC)   Left lower lobe pneumonia (HCC)   Leukocytosis   1. Acute on chronic respiratory failure-exacerbated by pneumonia.   Continue treating pneumonia with cefepime and continue supportive therapy.  Continue IV steroids.  SARS coronavirus 2 was negative.  Pt doing much better this morning and can go back to telemetry floor today.  Pt now on 4L [place] from 7L [place] yesterday and she wears 3 to 3.5 L at home. 2. Chronic respiratory failure/severe steroid and oxygen-dependent COPD- she is on temporary IV steroids and resumed home bronchodilators. 3. Left basilar pneumonia- continue antibiotics.  Vancomycin DC'd with negative MRSA screen.  Continue cefepime.  Continue supportive therapy. 4. Abdominal distension - I got an acute abdominal series that is showing large amount of stool in colon.    She has had bowel movement on 6/2 after enema. 5. Type 2 diabetes mellitus, insulin requiring with hyperglycemia- she is having some steroid induced hyperglycemia.  We have increased her prandial and sliding scale coverage.  Continue monitoring CBGs closely and further titrate insulin doses as needed.  Likely also steroid-induced.  Continue on current dose of steroids. 6. Chronic atrial flutter- we have resumed home cardiac medications and full anticoagulation with Xarelto.   DVT  prophylaxis: Xarelto Code Status: Full Family Communication: We will update daughter on phone Disposition Plan: Transfer to telemetry and remove Foley catheter.  Continue current management and anticipate discharge after PT evaluation hopefully in the next 24 hours.   Consultants:   None  Procedures:   None  Antimicrobials:   Cefepime 5/30->  Vancomycin 5/30-5/31   Subjective: Patient seen and evaluated today with no new acute complaints or concerns. No acute concerns or events noted overnight.  She did have a bowel movement on 6/2.  She is overall doing well with no acute events or concerns overnight.  She did have some mild hypoglycemia.  She is alert and oriented x3 today.  Objective: Vitals:   08/14/18 0300 08/14/18 0400 08/14/18 0500 08/14/18 0600  BP: (!) 95/55 (!) 93/59 (!) 146/73 (!) 131/59  Pulse: 73 72 82 82  Resp: 18 (!) 27 20 16   Temp:  98.6 F (37 C)    TempSrc:  Oral    SpO2: 98% 99% 93% 96%  Weight:   78.4 kg   Height:        Intake/Output Summary (Last 24 hours) at 08/14/2018 0736 Last data filed at 08/14/2018 0600 Gross per 24 hour  Intake 561.33 ml  Output 900 ml  Net -338.67 ml   Filed Weights   08/12/18 0500 08/13/18 1535 08/14/18 0500  Weight: 80.9 kg 78.7 kg 78.4 kg    Examination:  General exam: Appears calm and comfortable  Respiratory system: Clear to auscultation. Respiratory effort normal.  Currently on 4 L nasal cannula. Cardiovascular system: S1 & S2 heard, RRR. No JVD, murmurs, rubs, gallops or clicks. No  pedal edema. Gastrointestinal system: Abdomen is nondistended, soft and nontender. No organomegaly or masses felt. Normal bowel sounds heard. Central nervous system: Alert and oriented. No focal neurological deficits. Extremities: Symmetric 5 x 5 power. Skin: No rashes, lesions or ulcers Psychiatry: Judgement and insight appear normal. Mood & affect appropriate.     Data Reviewed: I have personally reviewed following labs and  imaging studies  CBC: Recent Labs  Lab 08/10/18 1156 08/11/18 0359 08/12/18 0436 08/13/18 0506  WBC 14.1* 11.8* 16.0* 13.8*  NEUTROABS 12.0* 10.8* 14.7* 12.8*  HGB 12.5 11.2* 11.1* 11.0*  HCT 41.6 38.6 38.2 37.7  MCV 98.6 102.1* 101.3* 100.3*  PLT 252 238 242 371   Basic Metabolic Panel: Recent Labs  Lab 08/10/18 1156 08/11/18 0359 08/12/18 0436 08/13/18 0506  NA 140 143 139 141  K 4.4 4.8 5.1 5.2*  CL 99 99 98 97*  CO2 27 30 29  34*  GLUCOSE 292* 279* 178* 243*  BUN 17 15 20  28*  CREATININE 0.87 0.71 0.58 0.64  CALCIUM 9.4 9.0 8.8* 8.9  MG  --  2.2 2.4 2.4   GFR: Estimated Creatinine Clearance: 60.7 mL/min (by C-G formula based on SCr of 0.64 mg/dL). Liver Function Tests: Recent Labs  Lab 08/11/18 0359 08/12/18 0436 08/13/18 0506  AST 18 20 29   ALT 22 26 37  ALKPHOS 46 46 51  BILITOT 0.6 0.5 0.5  PROT 6.4* 6.2* 6.1*  ALBUMIN 3.8 3.6 3.7   No results for input(s): LIPASE, AMYLASE in the last 168 hours. No results for input(s): AMMONIA in the last 168 hours. Coagulation Profile: No results for input(s): INR, PROTIME in the last 168 hours. Cardiac Enzymes: Recent Labs  Lab 08/10/18 1156  TROPONINI <0.03   BNP (last 3 results) No results for input(s): PROBNP in the last 8760 hours. HbA1C: No results for input(s): HGBA1C in the last 72 hours. CBG: Recent Labs  Lab 08/13/18 0726 08/13/18 1107 08/13/18 1609 08/13/18 2122 08/13/18 2257  GLUCAP 212* 225* 131* 47* 173*   Lipid Profile: No results for input(s): CHOL, HDL, LDLCALC, TRIG, CHOLHDL, LDLDIRECT in the last 72 hours. Thyroid Function Tests: No results for input(s): TSH, T4TOTAL, FREET4, T3FREE, THYROIDAB in the last 72 hours. Anemia Panel: No results for input(s): VITAMINB12, FOLATE, FERRITIN, TIBC, IRON, RETICCTPCT in the last 72 hours. Sepsis Labs: No results for input(s): PROCALCITON, LATICACIDVEN in the last 168 hours.  Recent Results (from the past 240 hour(s))  SARS Coronavirus 2  (CEPHEID - Performed in [place] hospital lab), Hosp Order     Status: None   Collection Time: 08/10/18 11:56 AM  Result Value Ref Range Status   SARS Coronavirus 2 NEGATIVE NEGATIVE Final    Comment: (NOTE) If result is NEGATIVE SARS-CoV-2 target nucleic acids are NOT DETECTED. The SARS-CoV-2 RNA is generally detectable in upper and lower  respiratory specimens during the acute phase of infection. The lowest  concentration of SARS-CoV-2 viral copies this assay can detect is 250  copies / mL. A negative result does not preclude SARS-CoV-2 infection  and should not be used as the sole basis for treatment or other  patient management decisions.  A negative result may occur with  improper specimen collection / handling, submission of specimen other  than nasopharyngeal swab, presence of viral mutation(s) within the  areas targeted by this assay, and inadequate number of viral copies  (<250 copies / mL). A negative result must be combined with clinical  observations, patient history, and epidemiological information.  If result is POSITIVE SARS-CoV-2 target nucleic acids are DETECTED. The SARS-CoV-2 RNA is generally detectable in upper and lower  respiratory specimens dur ing the acute phase of infection.  Positive  results are indicative of active infection with SARS-CoV-2.  Clinical  correlation with patient history and other diagnostic information is  necessary to determine patient infection status.  Positive results do  not rule out bacterial infection or co-infection with other viruses. If result is PRESUMPTIVE POSTIVE SARS-CoV-2 nucleic acids MAY BE PRESENT.   A presumptive positive result was obtained on the submitted specimen  and confirmed on repeat testing.  While 2019 novel coronavirus  (SARS-CoV-2) nucleic acids may be present in the submitted sample  additional confirmatory testing may be necessary for epidemiological  and / or clinical management purposes  to differentiate  between  SARS-CoV-2 and other Sarbecovirus currently known to infect humans.  If clinically indicated additional testing with an alternate test  methodology [phone]) is advised. The SARS-CoV-2 RNA is generally  detectable in upper and lower respiratory sp ecimens during the acute  phase of infection. The expected result is Negative. Fact Sheet for Patients:  StrictlyIdeas.no Fact Sheet for Healthcare Providers: BankingDealers.co.za This test is not yet approved or cleared by the [place] FDA and has been authorized for detection and/or diagnosis of SARS-CoV-2 by FDA under an Emergency Use Authorization (EUA).  This EUA will remain in effect (meaning this test can be used) for the duration of the COVID-19 declaration under Section 564(b)(1) of the Act, 21 U.S.C. section 360bbb-3(b)(1), unless the authorization is terminated or revoked sooner. Performed at [facility], [address], [place], [place] [address]   Culture, blood (routine x 2) Call MD if unable to obtain prior to antibiotics being given     Status: None (Preliminary result)   Collection Time: 08/10/18  3:19 PM  Result Value Ref Range Status   Specimen Description BLOOD LEFT ARM  Final   Special Requests   Final    BOTTLES DRAWN AEROBIC AND ANAEROBIC Blood Culture adequate volume   Culture   Final    NO GROWTH 3 DAYS Performed at [facility], [address] [place], [place] [address]    Report Status PENDING  Incomplete  Culture, blood (routine x 2) Call MD if unable to obtain prior to antibiotics being given     Status: None (Preliminary result)   Collection Time: 08/10/18  3:28 PM  Result Value Ref Range Status   Specimen Description BLOOD LEFT WRIST  Final   Special Requests   Final    BOTTLES DRAWN AEROBIC AND ANAEROBIC Blood Culture adequate volume   Culture   Final    NO GROWTH 3 DAYS Performed at [facility], [address] [place], [place] [address]     Report Status PENDING  Incomplete  MRSA PCR Screening     Status: None   Collection Time: 08/10/18  4:19 PM  Result Value Ref Range Status   MRSA by PCR NEGATIVE NEGATIVE Final    Comment:        The GeneXpert MRSA Assay (FDA approved for NASAL specimens only), is one component of a comprehensive MRSA colonization surveillance program. It is not intended to diagnose MRSA infection nor to guide or monitor treatment for MRSA infections. Performed at [facility], [address] [place], [place] [address]          Radiology Studies: Dg Abd Acute 2+v W 1v Chest  Result Date: 08/13/2018 CLINICAL DATA:  Confusion, abdominal distension EXAM: DG ABDOMEN ACUTE W/ 1V CHEST COMPARISON:  Chest radiograph 08/10/2018 FINDINGS: Enlargement of cardiac silhouette. Mediastinal contours and pulmonary vascularity normal. Lordotic positioning. Bibasilar atelectasis and questionable small bibasilar effusions. No definite infiltrate or pneumothorax. Bones demineralized. Nonobstructive bowel gas pattern. Increased stool in RIGHT colon. No bowel dilatation, bowel wall thickening or free air. No definite urinary tract calcifications. IMPRESSION: Nonobstructive bowel gas pattern with increased stool in RIGHT colon. Enlargement of cardiac silhouette. Bibasilar atelectasis and questionable small pleural effusions. Electronically Signed   By: Lavonia Dana M.D.   On: 08/13/2018 10:10        Scheduled Meds: . amiodarone  200 mg Oral Daily  . fluticasone  2 spray Each Nare Daily  . fluticasone furoate-vilanterol  1 puff Inhalation Daily  . furosemide  40 mg Intravenous Daily  . guaiFENesin  1,200 mg Oral BID  . insulin aspart  0-15 Units Subcutaneous TID WC  . insulin aspart  0-5 Units Subcutaneous QHS  . insulin aspart  10 Units Subcutaneous TID WC  . ipratropium-albuterol  3 mL Nebulization QID  . methylPREDNISolone (SOLU-MEDROL) injection  40 mg Intravenous Q12H  . metoprolol tartrate  50 mg Oral BID  .  pneumococcal 23 valent vaccine  0.5 mL Intramuscular Tomorrow-1000  . rivaroxaban  20 mg Oral Q supper  . senna-docusate  1 tablet Oral BID   Continuous Infusions: . sodium chloride Stopped (08/13/18 0947)  . ceFEPime (MAXIPIME) IV Stopped (08/14/18 0540)     LOS: 4 days    Time spent: 30 minutes    Wallace Cogliano Darleen Crocker, DO [facility] Pager [phone]  If 7PM-7AM, please contact night-coverage [email or web address] Password [facility] 08/14/2018, 7:36 AM

## 2018-08-14 NOTE — Care Management Important Message (Signed)
Important Message  Patient Details  Name: Eileen [name] MRN: [id number] Date of Birth: 30-Dec-1945   Medicare Important Message Given:  Yes    [name] 08/14/2018, 2:14 PM

## 2018-08-14 NOTE — Progress Notes (Signed)
Pharmacy Antibiotic Note  Eileen [name] is a 73 y.o. female admitted on 08/10/2018 with pneumonia.  Pharmacy has been consulted for cefepime dosing.  Plan: cefepime 2gm IV q8h Monitor labs, c/s, and patient improvement.  Height: 5\' 2"  (157.5 cm) Weight: 172 lb 13.5 oz (78.4 kg) IBW/kg (Calculated) : 50.1  Temp (24hrs), Avg:98.4 F (36.9 C), Min:98 F (36.7 C), Max:98.6 F (37 C)  Recent Labs  Lab 08/10/18 1156 08/11/18 0359 08/12/18 0436 08/13/18 0506  WBC 14.1* 11.8* 16.0* 13.8*  CREATININE 0.87 0.71 0.58 0.64    Estimated Creatinine Clearance: 60.7 mL/min (by C-G formula based on SCr of 0.64 mg/dL).    Allergies  Allergen Reactions  . Codeine Other (See Comments)    "jittery"    Antimicrobials this admission: Vanc 5/30 >> 5/31 Cefepime 5/30 >>   Dose adjustment: renal fxn improved 5/31 Cefepime increase to 2gm IV q8h  Microbiology results: 5/30 [place]: ngtd 5/30 MRSA PCR is negative   5/30 SARS-CoV-2: negative   Thank you for allowing pharmacy to be a part of this patient's care.  Margot Ables, PharmD Clinical Pharmacist 08/14/2018 11:47 AM

## 2018-08-14 NOTE — Plan of Care (Signed)
  Problem: Acute Rehab PT Goals(only PT should resolve) Goal: Pt Will Go Supine/Side To Sit Outcome: Progressing Flowsheets (Taken 08/14/2018 1229) Pt will go Supine/Side to Sit: with supervision Goal: Patient Will Transfer Sit To/From Stand Outcome: Progressing Flowsheets (Taken 08/14/2018 1229) Patient will transfer sit to/from stand: with min guard assist Goal: Pt Will Transfer Bed To Chair/Chair To Bed Outcome: Progressing Flowsheets (Taken 08/14/2018 1229) Pt will Transfer Bed to Chair/Chair to Bed: min guard assist Goal: Pt Will Ambulate Outcome: Progressing Flowsheets (Taken 08/14/2018 1229) Pt will Ambulate: 50 feet; with minimal assist; with rolling walker   12:30 PM, 08/14/18 Lonell Grandchild, MPT Physical Therapist with [facility] [phone] office [phone] mobile phone

## 2018-08-14 NOTE — TOC Progression Note (Signed)
Transition of Care [facility]) - Progression Note    Patient Details  Name: Eileen [name] MRN: [id number] Date of Birth: December 16, 1945  Transition of Care [facility]) CM/SW Contact  Shade Flood, LCSW Phone Number: 08/14/2018, 1:13 PM  Clinical Narrative:     PT recommending SNF for rehab at dc. Spoke with pt and then dtr by phone. Dtr, Eileen [name], would like to speak with her siblings later this afternoon to make a decision. Will call [name] in AM to further assist with dc plan.  Expected Discharge Plan: [place] Barriers to Discharge: Continued Medical Work up  Expected Discharge Plan and Services Expected Discharge Plan: [place] arrangements for the past 2 months: Single Family Home                                       Social Determinants of Health (SDOH) Interventions    Readmission Risk Interventions Readmission Risk Prevention Plan 08/12/2018 06/28/2018  Transportation Screening Complete Complete  Home Care Screening Complete Complete  Some recent data might be hidden

## 2018-08-15 ENCOUNTER — Inpatient Hospital Stay (HOSPITAL_COMMUNITY): Payer: Medicare Other

## 2018-08-15 LAB — CBC
HCT: 40.3 % (ref 36.0–46.0)
Hemoglobin: 12 g/dL (ref 12.0–15.0)
MCH: 29.1 pg (ref 26.0–34.0)
MCHC: 29.8 g/dL — ABNORMAL LOW (ref 30.0–36.0)
MCV: 97.6 fL (ref 80.0–100.0)
Platelets: 260 10*3/uL (ref 150–400)
RBC: 4.13 MIL/uL (ref 3.87–5.11)
RDW: 13.5 % (ref 11.5–15.5)
WBC: 14.8 10*3/uL — ABNORMAL HIGH (ref 4.0–10.5)
nRBC: 0 % (ref 0.0–0.2)

## 2018-08-15 LAB — BASIC METABOLIC PANEL
Anion gap: 13 (ref 5–15)
BUN: 21 mg/dL (ref 8–23)
CO2: 38 mmol/L — ABNORMAL HIGH (ref 22–32)
Calcium: 9.7 mg/dL (ref 8.9–10.3)
Chloride: 90 mmol/L — ABNORMAL LOW (ref 98–111)
Creatinine, Ser: 0.56 mg/dL (ref 0.44–1.00)
GFR calc Af Amer: >60 mL/min (ref >60)
GFR calc non Af Amer: >60 mL/min (ref >60)
Glucose, Bld: 206 mg/dL — ABNORMAL HIGH (ref 70–99)
Potassium: 4.2 mmol/L (ref 3.5–5.1)
Sodium: 141 mmol/L (ref 135–145)

## 2018-08-15 LAB — CULTURE, BLOOD (ROUTINE X 2)
Culture: NO GROWTH
Culture: NO GROWTH
Special Requests: ADEQUATE
Special Requests: ADEQUATE

## 2018-08-15 LAB — GLUCOSE, CAPILLARY
Glucose-Capillary: 163 mg/dL — ABNORMAL HIGH (ref 70–99)
Glucose-Capillary: 162 mg/dL — ABNORMAL HIGH (ref 70–99)
Glucose-Capillary: 242 mg/dL — ABNORMAL HIGH (ref 70–99)
Glucose-Capillary: 278 mg/dL — ABNORMAL HIGH (ref 70–99)

## 2018-08-15 LAB — BLOOD GAS, ARTERIAL
Acid-Base Excess: 19.3 mmol/L — ABNORMAL HIGH (ref 0.0–2.0)
Bicarbonate: 41.3 mmol/L — ABNORMAL HIGH (ref 20.0–28.0)
FIO2: 35
O2 Saturation: 90.6 %
Patient temperature: 37
pCO2 arterial: 72.6 mmHg (ref 32.0–48.0)
pH, Arterial: 7.412 (ref 7.350–7.450)
pO2, Arterial: 61.5 mmHg — ABNORMAL LOW (ref 83.0–108.0)

## 2018-08-15 LAB — URINE CULTURE: Culture: NO GROWTH

## 2018-08-15 LAB — MAGNESIUM: Magnesium: 2.4 mg/dL (ref 1.7–2.4)

## 2018-08-15 MED ORDER — METHYLPREDNISOLONE SODIUM SUCC 125 MG IJ SOLR
60.0000 mg | Freq: Three times a day (TID) | INTRAMUSCULAR | Status: DC
  Administered 2018-08-15 – 2018-08-17 (×6): 60 mg via INTRAVENOUS
  Filled 2018-08-15 (×6): qty 2

## 2018-08-15 NOTE — Progress Notes (Signed)
BIPAP currently on standby. Will placed patient back on later tonight.

## 2018-08-15 NOTE — Progress Notes (Signed)
Attempted to wean from BIPAP this morning started on 4L O2 at 84% increased o2 to 6L O2 remained in mid 80s. Pt had increased WOB and desatted to 66% place pt back on BIPAP for comfort notified Dr. Manuella Ghazi.

## 2018-08-15 NOTE — Progress Notes (Signed)
Physical Therapy Treatment Patient Details Name: Eileen A Quant MRN: [id number] DOB: 1945/05/25 Today's Date: 08/15/2018    History of Present Illness Eileen [name] is a 73 y.o. female with steroid-dependent and chronic oxygen requiring COPD reports that for the past 3 days she has been having increasing shortness of breath.  She had increased her home oxygen and nebulizer treatments for the past couple of days and initially had some relief but continues to have increasing shortness of breath prompting her to come to the emergency department.  She denies chest pain.  She denies fever and chills.  She reports that she has had a mostly nonproductive cough.  She has been wheezing more than normal.  She denies any known sick contacts.  No known exposure to COVID.    PT Comments    Patient demonstrates slow labored movement for sitting up at bedside, able to perform  BLE ROM/strengthening exercises while maintaining trunk in midline, tolerated standing marching in place for up to 3 minutes before having to sit due to fatigue.  Patient put back to bed with Bi-PAP in place.  Patient will benefit from continued physical therapy in hospital and recommended venue below to increase strength, balance, endurance for safe ADLs and gait.   Follow Up Recommendations  SNF;Supervision/Assistance - 24 hour;Supervision for mobility/OOB     Equipment Recommendations  Rolling walker with 5" wheels    Recommendations for Other Services       Precautions / Restrictions Precautions Precautions: Fall Restrictions Weight Bearing Restrictions: No    Mobility  Bed Mobility Overal bed mobility: Needs Assistance Bed Mobility: Supine to Sit;Sit to Supine     Supine to sit: Min assist Sit to supine: Min guard;Min assist   General bed mobility comments: labored movement, required assistance to move BLE  Transfers Overall transfer level: Needs assistance Equipment used: Rolling walker (2  wheeled) Transfers: Sit to/from Stand Sit to Stand: Min assist Stand pivot transfers: Min assist       General transfer comment: increased time, labored movement  Ambulation/Gait Ambulation/Gait assistance: Mod assist Gait Distance (Feet): 6 Feet Assistive device: Rolling walker (2 wheeled) Gait Pattern/deviations: Decreased step length - right;Decreased step length - left;Decreased stride length Gait velocity: slow   General Gait Details: limited to 3-4 slow labored side steps due to fatigue, poor standing balance, tolerated marching in place for up to 5-6 steps before having to sit   Stairs             Wheelchair Mobility    Modified Rankin (Stroke Patients Only)       Balance Overall balance assessment: Needs assistance Sitting-balance support: Feet supported;No upper extremity supported Sitting balance-Leahy Scale: Fair     Standing balance support: Bilateral upper extremity supported;During functional activity Standing balance-Leahy Scale: Fair Standing balance comment: using RW                            Cognition Arousal/Alertness: Awake/alert Behavior During Therapy: WFL for tasks assessed/performed Overall Cognitive Status: Within Functional Limits for tasks assessed                                        Exercises General Exercises - Lower Extremity Long Arc Quad: Seated;Strengthening;AROM;Both;10 reps Hip Flexion/Marching: Seated;Strengthening;AROM;Both;10 reps Toe Raises: Seated;Strengthening;AROM;Both;10 reps Heel Raises: Seated;AROM;Strengthening;Both;10 reps    General Comments  Pertinent Vitals/Pain Pain Assessment: No/denies pain    Home Living                      Prior Function            PT Goals (current goals can now be found in the care plan section) Acute Rehab PT Goals Patient Stated Goal: return home PT Goal Formulation: With patient Time For Goal Achievement:  08/28/18 Potential to Achieve Goals: Good Progress towards PT goals: Progressing toward goals    Frequency    Min 3X/week      PT Plan Current plan remains appropriate    Co-evaluation              AM-PAC PT "6 Clicks" Mobility   Outcome Measure  Help needed turning from your back to your side while in a flat bed without using bedrails?: A Little Help needed moving from lying on your back to sitting on the side of a flat bed without using bedrails?: A Little Help needed moving to and from a bed to a chair (including a wheelchair)?: A Lot Help needed standing up from a chair using your arms (e.g., wheelchair or bedside chair)?: A Lot Help needed to walk in hospital room?: A Lot Help needed climbing 3-5 steps with a railing? : Total 6 Click Score: 13    End of Session Equipment Utilized During Treatment: (Bi-Pap) Activity Tolerance: Patient tolerated treatment well;Patient limited by fatigue Patient left: in bed;with call bell/phone within reach;with bed alarm set Nurse Communication: Mobility status PT Visit Diagnosis: Unsteadiness on feet (R26.81);Other abnormalities of gait and mobility (R26.89);Muscle weakness (generalized) (M62.81)     Time: 0867-6195 PT Time Calculation (min) (ACUTE ONLY): 31 min  Charges:  $Therapeutic Exercise: 8-22 mins $Therapeutic Activity: 8-22 mins                     2:20 PM, 08/15/18 Lonell Grandchild, MPT Physical Therapist with [facility] [phone] office [phone] mobile phone

## 2018-08-15 NOTE — Consult Note (Signed)
Consult requested by: [facility] hospitalist, Dr. Manuella Ghazi Consult requested for: Respiratory failure  HPI: This is a 73 year old who was admitted to the hospital with increasing shortness of breath.  She is known to have COPD.  She has had cardiac arrhythmias and her BNP was elevated on admission.  She became more confused on the second and she was transferred to stepdown.  She continued having confusion on the third and had a blood gas done that showed that her PCO2 was greater than 80 and she was started on BiPAP.  This morning her PCO2 is down into the 70s but she did not tolerate coming off BiPAP initially although she is off right now because she is getting a nebulizer treatment and she is able to talk to me a little bit.  Chest x-ray this morning is pending.  She says she feels like she is swollen in her abdomen.  Review of systems not able to be fully obtained because of her current situation  Past Medical History:  Diagnosis Date  . Asthma   . Atrial flutter ([place])   . Cancer [facility])    Right breast  . COPD (chronic obstructive pulmonary disease) ([place])   . History of breast cancer    right breast  . Hypertension   . On home O2   . Type 2 diabetes mellitus (HCC)      Family History  Problem Relation Age of Onset  . Cancer Mother        lung  . Diabetes Brother      Social History   Socioeconomic History  . Marital status: Single    Spouse name: Not on file  . Number of children: Not on file  . Years of education: Not on file  . Highest education level: Not on file  Occupational History  . Not on file  Social Needs  . Financial resource strain: Not hard at all  . Food insecurity:    Worry: Never true    Inability: Never true  . Transportation needs:    Medical: Patient refused    Non-medical: Patient refused  Tobacco Use  . Smoking status: Former Smoker    Packs/day: 0.50    Years: 32.00    Pack years: 16.00    Types: Cigarettes    Start date: 12/12/1962    Last attempt  to quit: 03/13/1994    Years since quitting: 24.4  . Smokeless tobacco: Never Used  Substance and Sexual Activity  . Alcohol use: No    Alcohol/week: 0.0 standard drinks  . Drug use: No  . Sexual activity: Not on file  Lifestyle  . Physical activity:    Days per week: Patient refused    Minutes per session: Patient refused  . Stress: Not at all  Relationships  . Social connections:    Talks on phone: Three times a week    Gets together: Three times a week    Attends religious service: More than 4 times per year    Active member of club or organization: Yes    Attends meetings of clubs or organizations: More than 4 times per year    Relationship status: Never married  Other Topics Concern  . Not on file  Social History Narrative  . Not on file     ROS: Not fully obtainable    Objective: Vital signs in last 24 hours: Temp:  [98.2 F (36.8 C)-99.9 F (37.7 C)] 98.2 F (36.8 C) (06/04 0400) Pulse Rate:  [34-111]  85 (06/04 0600) Resp:  [14-36] 20 (06/04 0600) BP: (131-169)/(59-138) 168/94 (06/04 0600) SpO2:  [56 %-100 %] 95 % (06/04 0836) FiO2 (%):  [35 %-36 %] 35 % (06/04 0836) Weight:  [78 kg] 78 kg (06/04 0500) Weight change: -0.7 kg Last BM Date: 08/14/18  Intake/Output from previous day: 06/03 0701 - 06/04 0700 In: 182.4 [IV Piggyback:182.4] Out: 1351 [Urine:1350; Stool:1]  PHYSICAL EXAM Constitutional: She is awake and alert but having some difficulty with her respirations.  She is currently receiving a nebulizer treatment.  Eyes: Pupils react EOMI.  Ears nose mouth and throat: Her mucous membranes are moist.  Hearing is grossly normal.  Cardiovascular: Her heart is regular and I do not hear a gallop.  Gastrointestinal: Her abdomen is soft it does seem protuberant and she says it feels like it swollen.  Musculoskeletal: Grossly normal strength upper and lower extremities bilaterally.  Neurological: No focal abnormalities.  Psychiatric: She is mildly anxious  Lab  Results: Basic Metabolic Panel: Recent Labs    08/13/18 0506 08/15/18 0439  NA 141 141  K 5.2* 4.2  CL 97* 90*  CO2 34* 38*  GLUCOSE 243* 206*  BUN 28* 21  CREATININE 0.64 0.56  CALCIUM 8.9 9.7  MG 2.4 2.4   Liver Function Tests: Recent Labs    08/13/18 0506  AST 29  ALT 37  ALKPHOS 51  BILITOT 0.5  PROT 6.1*  ALBUMIN 3.7   No results for input(s): LIPASE, AMYLASE in the last 72 hours. No results for input(s): AMMONIA in the last 72 hours. CBC: Recent Labs    08/13/18 0506 08/15/18 0439  WBC 13.8* 14.8*  NEUTROABS 12.8*  --   HGB 11.0* 12.0  HCT 37.7 40.3  MCV 100.3* 97.6  PLT 256 260   Cardiac Enzymes: No results for input(s): CKTOTAL, CKMB, CKMBINDEX, TROPONINI in the last 72 hours. BNP: No results for input(s): PROBNP in the last 72 hours. D-Dimer: No results for input(s): DDIMER in the last 72 hours. CBG: Recent Labs    08/13/18 2257 08/14/18 0808 08/14/18 1123 08/14/18 1624 08/14/18 2119 08/15/18 0740  GLUCAP 173* 166* 157* 84 173* 163*   Hemoglobin A1C: No results for input(s): HGBA1C in the last 72 hours. Fasting Lipid Panel: No results for input(s): CHOL, HDL, LDLCALC, TRIG, CHOLHDL, LDLDIRECT in the last 72 hours. Thyroid Function Tests: No results for input(s): TSH, T4TOTAL, FREET4, T3FREE, THYROIDAB in the last 72 hours. Anemia Panel: No results for input(s): VITAMINB12, FOLATE, FERRITIN, TIBC, IRON, RETICCTPCT in the last 72 hours. Coagulation: No results for input(s): LABPROT, INR in the last 72 hours. Urine Drug Screen: Drugs of Abuse  No results found for: LABOPIA, COCAINSCRNUR, LABBENZ, AMPHETMU, THCU, LABBARB  Alcohol Level: No results for input(s): ETH in the last 72 hours. Urinalysis: Recent Labs    08/13/18 1725  COLORURINE YELLOW  LABSPEC 1.020  PHURINE 5.0  GLUCOSEU NEGATIVE  HGBUR MODERATE*  BILIRUBINUR NEGATIVE  KETONESUR NEGATIVE  PROTEINUR NEGATIVE  NITRITE NEGATIVE  LEUKOCYTESUR SMALL*   Misc.  Labs:   ABGS: Recent Labs    08/15/18 0500  PHART 7.412  PO2ART 61.5*  HCO3 41.3*     MICROBIOLOGY: Recent Results (from the past 240 hour(s))  SARS Coronavirus 2 (CEPHEID - Performed in [place] hospital lab), Hosp Order     Status: None   Collection Time: 08/10/18 11:56 AM  Result Value Ref Range Status   SARS Coronavirus 2 NEGATIVE NEGATIVE Final    Comment: (NOTE) If result  is NEGATIVE SARS-CoV-2 target nucleic acids are NOT DETECTED. The SARS-CoV-2 RNA is generally detectable in upper and lower  respiratory specimens during the acute phase of infection. The lowest  concentration of SARS-CoV-2 viral copies this assay can detect is 250  copies / mL. A negative result does not preclude SARS-CoV-2 infection  and should not be used as the sole basis for treatment or other  patient management decisions.  A negative result may occur with  improper specimen collection / handling, submission of specimen other  than nasopharyngeal swab, presence of viral mutation(s) within the  areas targeted by this assay, and inadequate number of viral copies  (<250 copies / mL). A negative result must be combined with clinical  observations, patient history, and epidemiological information. If result is POSITIVE SARS-CoV-2 target nucleic acids are DETECTED. The SARS-CoV-2 RNA is generally detectable in upper and lower  respiratory specimens dur ing the acute phase of infection.  Positive  results are indicative of active infection with SARS-CoV-2.  Clinical  correlation with patient history and other diagnostic information is  necessary to determine patient infection status.  Positive results do  not rule out bacterial infection or co-infection with other viruses. If result is PRESUMPTIVE POSTIVE SARS-CoV-2 nucleic acids MAY BE PRESENT.   A presumptive positive result was obtained on the submitted specimen  and confirmed on repeat testing.  While 2019 novel coronavirus  (SARS-CoV-2)  nucleic acids may be present in the submitted sample  additional confirmatory testing may be necessary for epidemiological  and / or clinical management purposes  to differentiate between  SARS-CoV-2 and other Sarbecovirus currently known to infect humans.  If clinically indicated additional testing with an alternate test  methodology [phone]) is advised. The SARS-CoV-2 RNA is generally  detectable in upper and lower respiratory sp ecimens during the acute  phase of infection. The expected result is Negative. Fact Sheet for Patients:  StrictlyIdeas.no Fact Sheet for Healthcare Providers: BankingDealers.co.za This test is not yet approved or cleared by the [place] FDA and has been authorized for detection and/or diagnosis of SARS-CoV-2 by FDA under an Emergency Use Authorization (EUA).  This EUA will remain in effect (meaning this test can be used) for the duration of the COVID-19 declaration under Section 564(b)(1) of the Act, 21 U.S.C. section 360bbb-3(b)(1), unless the authorization is terminated or revoked sooner. Performed at [facility], [address], [place], [place] [address]   Culture, blood (routine x 2) Call MD if unable to obtain prior to antibiotics being given     Status: None   Collection Time: 08/10/18  3:19 PM  Result Value Ref Range Status   Specimen Description BLOOD LEFT ARM  Final   Special Requests   Final    BOTTLES DRAWN AEROBIC AND ANAEROBIC Blood Culture adequate volume   Culture   Final    NO GROWTH 5 DAYS Performed at [facility], [address], [place],  [address]    Report Status 08/15/2018 FINAL  Final  Culture, blood (routine x 2) Call MD if unable to obtain prior to antibiotics being given     Status: None   Collection Time: 08/10/18  3:28 PM  Result Value Ref Range Status   Specimen Description BLOOD LEFT WRIST  Final   Special Requests   Final    BOTTLES DRAWN AEROBIC AND ANAEROBIC  Blood Culture adequate volume   Culture   Final    NO GROWTH 5 DAYS Performed at [facility], [place]  [address] [place], [place] [address]    Report Status 08/15/2018 FINAL  Final  MRSA PCR Screening     Status: None   Collection Time: 08/10/18  4:19 PM  Result Value Ref Range Status   MRSA by PCR NEGATIVE NEGATIVE Final    Comment:        The GeneXpert MRSA Assay (FDA approved for NASAL specimens only), is one component of a comprehensive MRSA colonization surveillance program. It is not intended to diagnose MRSA infection nor to guide or monitor treatment for MRSA infections. Performed at [facility], [address], [place], [place] [address]   Culture, Urine     Status: None   Collection Time: 08/13/18  5:25 PM  Result Value Ref Range Status   Specimen Description   Final    URINE, RANDOM Performed at [facility], [address] [place], [place] [address]    Special Requests   Final    NONE Performed at [facility], [address] [place], [place] [address]    Culture   Final    NO GROWTH Performed at [facility], [place] [address] [place], [place] [address]    Report Status 08/15/2018 FINAL  Final    Studies/Results: Dg Abd Acute 2+v W 1v Chest  Result Date: 08/13/2018 CLINICAL DATA:  Confusion, abdominal distension EXAM: DG ABDOMEN ACUTE W/ 1V CHEST COMPARISON:  Chest radiograph 08/10/2018 FINDINGS: Enlargement of cardiac silhouette. Mediastinal contours and pulmonary vascularity normal. Lordotic positioning. Bibasilar atelectasis and questionable small bibasilar effusions. No definite infiltrate or pneumothorax. Bones demineralized. Nonobstructive bowel gas pattern. Increased stool in RIGHT colon. No bowel dilatation, bowel wall thickening or free air. No definite urinary tract calcifications. IMPRESSION: Nonobstructive bowel gas pattern with increased stool in RIGHT colon. Enlargement of cardiac silhouette. Bibasilar atelectasis and questionable small pleural  effusions. Electronically Signed   By: Lavonia Dana M.D.   On: 08/13/2018 10:10    Medications:  Prior to Admission:  Medications Prior to Admission  Medication Sig Dispense Refill Last Dose  . acetaminophen (TYLENOL) 500 MG tablet Take 500 mg by mouth every 8 (eight) hours as needed for mild pain or headache.    Taking  . albuterol (PROVENTIL) (2.5 MG/3ML) 0.083% nebulizer solution Take 2.5 mg by nebulization every 6 (six) hours as needed for wheezing or shortness of breath.   Taking  . amiodarone (PACERONE) 200 MG tablet Take 1 tablet (200 mg total) by mouth daily. 90 tablet 3 08/09/2018 at Unknown time  . blood glucose meter kit and supplies KIT Dispense based on patient and insurance preference. Use up to four times daily as directed. (FOR ICD-9 250.00, 250.01).  Check sugar before meals and at night, keep a log. 1 each 0 Taking  . furosemide (LASIX) 40 MG tablet 20 mg daily.    Taking  . guaiFENesin (MUCINEX) 600 MG 12 hr tablet Take 1 tablet (600 mg total) by mouth 2 (two) times daily. 30 tablet 0 Taking  . meclizine (ANTIVERT) 12.5 MG tablet Take 1 tablet by mouth 3 (three) times daily as needed.   Taking  . metFORMIN (GLUCOPHAGE-XR) 500 MG 24 hr tablet Take 500 mg by mouth daily.  1 08/09/2018 at Unknown time  . metoprolol tartrate (LOPRESSOR) 50 MG tablet Take 50 mg by mouth 2 (two) times daily.   08/10/2018 at 830  . Multiple Vitamins-Minerals (EMERGEN-C IMMUNE) PACK Take 1 Package by mouth daily.   08/09/2018 at Unknown time  . OXYGEN Inhale 2.5-3 L into the  lungs daily. At night and daily as needed   Taking  . predniSONE (DELTASONE) 10 MG tablet Take 10 mg by mouth daily.   08/10/2018 at Unknown time  . PROAIR HFA 108 (90 BASE) MCG/ACT inhaler Inhale 2 puffs into the lungs every 6 (six) hours as needed for wheezing or shortness of breath.    Taking  . psyllium (HYDROCIL/METAMUCIL) 95 % PACK Take 1 packet by mouth daily. (Patient taking differently: Take 1 packet by mouth daily as needed.  ) 240 each 0 Taking  . rivaroxaban (XARELTO) 20 MG TABS tablet Take 1 tablet (20 mg total) by mouth daily with supper. 21 tablet 0 08/10/2018 at 830  . TRELEGY ELLIPTA 100-62.5-25 MCG/INH AEPB Take 1 puff by mouth daily.  0 08/10/2018 at Unknown time  . Vitamin D, Ergocalciferol, (DRISDOL) 50000 units CAPS capsule Take 50,000 Units every 30 (thirty) days by mouth.   Past Month at Unknown time   Scheduled: . amiodarone  200 mg Oral Daily  . chlorhexidine  15 mL Mouth Rinse BID  . fluticasone  2 spray Each Nare Daily  . fluticasone furoate-vilanterol  1 puff Inhalation Daily  . furosemide  40 mg Intravenous Daily  . guaiFENesin  1,200 mg Oral BID  . insulin aspart  0-5 Units Subcutaneous QHS  . insulin aspart  0-9 Units Subcutaneous TID WC  . insulin aspart  7 Units Subcutaneous TID WC  . ipratropium-albuterol  3 mL Nebulization QID  . mouth rinse  15 mL Mouth Rinse q12n4p  . methylPREDNISolone (SOLU-MEDROL) injection  60 mg Intravenous Q8H  . metoprolol tartrate  50 mg Oral BID  . pneumococcal 23 valent vaccine  0.5 mL Intramuscular Tomorrow-1000  . rivaroxaban  20 mg Oral Q supper  . senna-docusate  1 tablet Oral BID   Continuous: . sodium chloride Stopped (08/13/18 0947)  . ceFEPime (MAXIPIME) IV 2 g (08/15/18 1478)   GNF:AOZHYQ chloride, acetaminophen, albuterol, meclizine  Assesment: She has acute on chronic hypoxic and hypercapnic respiratory failure.  She is worse in the last 24 hours.  She is on appropriate treatment.  I did increase her steroids.  Agree with checking a chest x-ray.  I wonder if some of this is related to heart failure/volume overload.  She had pleural effusions on the chest x-ray done on the second and she complained that she had does have some abdominal distention.  She is on IV Lasix already.  She has atrial flutter but appears to be in sinus rhythm now although it is difficult to tell on the monitor because of extensive artifact.  She has diabetes on  treatment Principal Problem:   Acute and chronic respiratory failure (acute-on-chronic) (HCC) Active Problems:   COPD (chronic obstructive pulmonary disease) (HCC)   Essential hypertension   Atrial flutter (HCC)   Diabetes mellitus ([place])   Chronic respiratory failure with hypoxia (HCC)   Left lower lobe pneumonia (HCC)   Leukocytosis    Plan: Increase steroids.  Continue BiPAP as needed.  Check chest x-ray.  She is high risk of deteriorating further needing intubation and mechanical ventilation.    LOS: 5 days   Eileen [name] 08/15/2018, 8:45 AM

## 2018-08-15 NOTE — Progress Notes (Signed)
PROGRESS NOTE    Eileen [name]  BUL:[id number] DOB: [birth year] DOA: 08/10/2018 PCP: Lavella Lemons, PA   Brief Narrative:  73 year old oxygen and steroid-dependent COPD presented with 3 days of progressive shortness of breath.  She was more confused and short of breath on 6/2 prompting transfer to stepdown unit for closer monitoring.  She continues to have some ongoing confusion on 6/3 and required BiPAP due to hypercapnia.   Assessment & Plan:   Principal Problem:   Acute and chronic respiratory failure (acute-on-chronic) (HCC) Active Problems:   COPD (chronic obstructive pulmonary disease) (HCC)   Essential hypertension   Atrial flutter (HCC)   Diabetes mellitus ([place])   Chronic respiratory failure with hypoxia (HCC)   Left lower lobe pneumonia (HCC)   Leukocytosis   1. Acute on chronic respiratory failure-exacerbated bypneumonia. Continue treating pneumonia with cefepimeand continue supportive therapy. Continue IV steroids. SARS coronavirus 2 was negative.  Patient appears less confused after some confusion noted on 6/3.  PCO2 levels improved on ABG.  Wean BiPAP today and recheck ABG in a.m. 2. Chronic respiratory failure/severesteroid and oxygen-dependentCOPD-she is on temporary IV steroids and resumed home bronchodilators.  3. Left basilar pneumonia-continue antibiotics. Vancomycin DC'd with negative MRSA screen. Continue cefepime. Continue supportive therapy. 4. Abdominal distension - I got an acute abdominal series that is showing large amount of stool in colon.   She has had bowel movement on 6/2 after enema. 5. Type 2 diabetes mellitus, insulin requiring with hyperglycemia-she is having some steroid induced hyperglycemia. We have increased her prandial and sliding scale coverage. Continue monitoring CBGs closely and further titrate insulin doses as needed.  Likely also steroid-induced.  Continue on current dose of steroids. 6. Chronic atrial flutter-we have  resumed home cardiac medications and full anticoagulation with Xarelto.  Currently rate controlled.   DVT prophylaxis: Xarelto Code Status: Full Family Communication: We will update daughter on phone-[name] Disposition Plan:  Continue stepdown unit observation and removed BiPAP with repeat ABG in a.m.  Continue to monitor for confusion.  Family deciding between home health PT and SNF.   Consultants:   None  Procedures:   None  Antimicrobials:   Cefepime 5/30->  Vancomycin 5/30-5/31  Subjective: Patient seen and evaluated today with no new acute complaints or concerns. No acute concerns or events noted overnight.  She has remained on BiPAP overnight due to hypercapnia with improved ABG this a.m.  Objective: Vitals:   08/15/18 0313 08/15/18 0400 08/15/18 0500 08/15/18 0600  BP: (!) 146/79 131/64 (!) 136/94 (!) 168/94  Pulse: 81 86 89 85  Resp: (!) 34 (!) 27 20 20   Temp:  98.2 F (36.8 C)    TempSrc:  Axillary    SpO2: 93% 95% 93% 96%  Weight:   78 kg   Height:        Intake/Output Summary (Last 24 hours) at 08/15/2018 0728 Last data filed at 08/15/2018 3212 Gross per 24 hour  Intake 182.37 ml  Output 1351 ml  Net -1168.63 ml   Filed Weights   08/13/18 1535 08/14/18 0500 08/15/18 0500  Weight: 78.7 kg 78.4 kg 78 kg    Examination:  General exam: Appears calm and comfortable  Respiratory system: Clear to auscultation. Respiratory effort normal. Cardiovascular system: S1 & S2 heard, RRR. No JVD, murmurs, rubs, gallops or clicks. No pedal edema. Gastrointestinal system: Abdomen is nondistended, soft and nontender. No organomegaly or masses felt. Normal bowel sounds heard. Central nervous system: Alert and oriented. No focal neurological deficits.  Extremities: Symmetric 5 x 5 power. Skin: No rashes, lesions or ulcers Psychiatry: Judgement and insight appear normal. Mood & affect appropriate.     Data Reviewed: I have personally reviewed following labs and  imaging studies  CBC: Recent Labs  Lab 08/10/18 1156 08/11/18 0359 08/12/18 0436 08/13/18 0506 08/15/18 0439  WBC 14.1* 11.8* 16.0* 13.8* 14.8*  NEUTROABS 12.0* 10.8* 14.7* 12.8*  --   HGB 12.5 11.2* 11.1* 11.0* 12.0  HCT 41.6 38.6 38.2 37.7 40.3  MCV 98.6 102.1* 101.3* 100.3* 97.6  PLT 252 238 242 256 470   Basic Metabolic Panel: Recent Labs  Lab 08/10/18 1156 08/11/18 0359 08/12/18 0436 08/13/18 0506 08/15/18 0439  NA 140 143 139 141 141  K 4.4 4.8 5.1 5.2* 4.2  CL 99 99 98 97* 90*  CO2 27 30 29  34* 38*  GLUCOSE 292* 279* 178* 243* 206*  BUN 17 15 20  28* 21  CREATININE 0.87 0.71 0.58 0.64 0.56  CALCIUM 9.4 9.0 8.8* 8.9 9.7  MG  --  2.2 2.4 2.4 2.4   GFR: Estimated Creatinine Clearance: 60.6 mL/min (by C-G formula based on SCr of 0.56 mg/dL). Liver Function Tests: Recent Labs  Lab 08/11/18 0359 08/12/18 0436 08/13/18 0506  AST 18 20 29   ALT 22 26 37  ALKPHOS 46 46 51  BILITOT 0.6 0.5 0.5  PROT 6.4* 6.2* 6.1*  ALBUMIN 3.8 3.6 3.7   No results for input(s): LIPASE, AMYLASE in the last 168 hours. No results for input(s): AMMONIA in the last 168 hours. Coagulation Profile: No results for input(s): INR, PROTIME in the last 168 hours. Cardiac Enzymes: Recent Labs  Lab 08/10/18 1156  TROPONINI <0.03   BNP (last 3 results) No results for input(s): PROBNP in the last 8760 hours. HbA1C: No results for input(s): HGBA1C in the last 72 hours. CBG: Recent Labs  Lab 08/13/18 2257 08/14/18 0808 08/14/18 1123 08/14/18 1624 08/14/18 2119  GLUCAP 173* 166* 157* 84 173*   Lipid Profile: No results for input(s): CHOL, HDL, LDLCALC, TRIG, CHOLHDL, LDLDIRECT in the last 72 hours. Thyroid Function Tests: No results for input(s): TSH, T4TOTAL, FREET4, T3FREE, THYROIDAB in the last 72 hours. Anemia Panel: No results for input(s): VITAMINB12, FOLATE, FERRITIN, TIBC, IRON, RETICCTPCT in the last 72 hours. Sepsis Labs: No results for input(s): PROCALCITON,  LATICACIDVEN in the last 168 hours.  Recent Results (from the past 240 hour(s))  SARS Coronavirus 2 (CEPHEID - Performed in [facility] hospital lab), Hosp Order     Status: None   Collection Time: 08/10/18 11:56 AM  Result Value Ref Range Status   SARS Coronavirus 2 NEGATIVE NEGATIVE Final    Comment: (NOTE) If result is NEGATIVE SARS-CoV-2 target nucleic acids are NOT DETECTED. The SARS-CoV-2 RNA is generally detectable in upper and lower  respiratory specimens during the acute phase of infection. The lowest  concentration of SARS-CoV-2 viral copies this assay can detect is 250  copies / mL. A negative result does not preclude SARS-CoV-2 infection  and should not be used as the sole basis for treatment or other  patient management decisions.  A negative result may occur with  improper specimen collection / handling, submission of specimen other  than nasopharyngeal swab, presence of viral mutation(s) within the  areas targeted by this assay, and inadequate number of viral copies  (<250 copies / mL). A negative result must be combined with clinical  observations, patient history, and epidemiological information. If result is POSITIVE SARS-CoV-2 target nucleic acids  are DETECTED. The SARS-CoV-2 RNA is generally detectable in upper and lower  respiratory specimens dur ing the acute phase of infection.  Positive  results are indicative of active infection with SARS-CoV-2.  Clinical  correlation with patient history and other diagnostic information is  necessary to determine patient infection status.  Positive results do  not rule out bacterial infection or co-infection with other viruses. If result is PRESUMPTIVE POSTIVE SARS-CoV-2 nucleic acids MAY BE PRESENT.   A presumptive positive result was obtained on the submitted specimen  and confirmed on repeat testing.  While 2019 novel coronavirus  (SARS-CoV-2) nucleic acids may be present in the submitted sample  additional confirmatory  testing may be necessary for epidemiological  and / or clinical management purposes  to differentiate between  SARS-CoV-2 and other Sarbecovirus currently known to infect humans.  If clinically indicated additional testing with an alternate test  methodology [phone]) is advised. The SARS-CoV-2 RNA is generally  detectable in upper and lower respiratory sp ecimens during the acute  phase of infection. The expected result is Negative. Fact Sheet for Patients:  StrictlyIdeas.no Fact Sheet for Healthcare Providers: BankingDealers.co.za This test is not yet approved or cleared by the [place] FDA and has been authorized for detection and/or diagnosis of SARS-CoV-2 by FDA under an Emergency Use Authorization (EUA).  This EUA will remain in effect (meaning this test can be used) for the duration of the COVID-19 declaration under Section 564(b)(1) of the Act, 21 U.S.C. section 360bbb-3(b)(1), unless the authorization is terminated or revoked sooner. Performed at [facility], [address] [place], [place] [address]   Culture, blood (routine x 2) Call MD if unable to obtain prior to antibiotics being given     Status: None (Preliminary result)   Collection Time: 08/10/18  3:19 PM  Result Value Ref Range Status   Specimen Description BLOOD LEFT ARM  Final   Special Requests   Final    BOTTLES DRAWN AEROBIC AND ANAEROBIC Blood Culture adequate volume   Culture   Final    NO GROWTH 4 DAYS Performed at [facility], [address], [place], [place] [address]    Report Status PENDING  Incomplete  Culture, blood (routine x 2) Call MD if unable to obtain prior to antibiotics being given     Status: None (Preliminary result)   Collection Time: 08/10/18  3:28 PM  Result Value Ref Range Status   Specimen Description BLOOD LEFT WRIST  Final   Special Requests   Final    BOTTLES DRAWN AEROBIC AND ANAEROBIC Blood Culture adequate volume   Culture    Final    NO GROWTH 4 DAYS Performed at [facility], [address], [place], [place] [address]    Report Status PENDING  Incomplete  MRSA PCR Screening     Status: None   Collection Time: 08/10/18  4:19 PM  Result Value Ref Range Status   MRSA by PCR NEGATIVE NEGATIVE Final    Comment:        The GeneXpert MRSA Assay (FDA approved for NASAL specimens only), is one component of a comprehensive MRSA colonization surveillance program. It is not intended to diagnose MRSA infection nor to guide or monitor treatment for MRSA infections. Performed at [facility], [address] [place], [place] [address]          Radiology Studies: Dg Abd Acute 2+v W 1v Chest  Result Date: 08/13/2018 CLINICAL DATA:  Confusion, abdominal distension EXAM: DG ABDOMEN ACUTE W/  1V CHEST COMPARISON:  Chest radiograph 08/10/2018 FINDINGS: Enlargement of cardiac silhouette. Mediastinal contours and pulmonary vascularity normal. Lordotic positioning. Bibasilar atelectasis and questionable small bibasilar effusions. No definite infiltrate or pneumothorax. Bones demineralized. Nonobstructive bowel gas pattern. Increased stool in RIGHT colon. No bowel dilatation, bowel wall thickening or free air. No definite urinary tract calcifications. IMPRESSION: Nonobstructive bowel gas pattern with increased stool in RIGHT colon. Enlargement of cardiac silhouette. Bibasilar atelectasis and questionable small pleural effusions. Electronically Signed   By: Lavonia Dana M.D.   On: 08/13/2018 10:10        Scheduled Meds: . amiodarone  200 mg Oral Daily  . chlorhexidine  15 mL Mouth Rinse BID  . fluticasone  2 spray Each Nare Daily  . fluticasone furoate-vilanterol  1 puff Inhalation Daily  . furosemide  40 mg Intravenous Daily  . guaiFENesin  1,200 mg Oral BID  . insulin aspart  0-5 Units Subcutaneous QHS  . insulin aspart  0-9 Units Subcutaneous TID WC  . insulin aspart  7 Units Subcutaneous TID WC  .  ipratropium-albuterol  3 mL Nebulization QID  . mouth rinse  15 mL Mouth Rinse q12n4p  . methylPREDNISolone (SOLU-MEDROL) injection  40 mg Intravenous Q12H  . metoprolol tartrate  50 mg Oral BID  . pneumococcal 23 valent vaccine  0.5 mL Intramuscular Tomorrow-1000  . rivaroxaban  20 mg Oral Q supper  . senna-docusate  1 tablet Oral BID   Continuous Infusions: . sodium chloride Stopped (08/13/18 0947)  . ceFEPime (MAXIPIME) IV 2 g (08/15/18 0605)     LOS: 5 days    Time spent: 30 minutes    [place] Darleen Crocker, DO [facility] Pager [phone]  If 7PM-7AM, please contact night-coverage [email or web address] Password [facility] 08/15/2018, 7:28 AM

## 2018-08-15 NOTE — TOC Progression Note (Signed)
Transition of Care [facility]) - Progression Note    Patient Details  Name: [name] Bechtel MRN: [id number] Date of Birth: [birth year]  [facility]) CM/SW Contact  Shade Flood, LCSW Phone Number: 08/15/2018, 11:53 AM  Clinical Narrative:     TOC following. Pt with increased respiratory needs overnight. Pt not yet stable for dc. Message left with pt's daughter to follow up on dc planning conversation from yesterday. Will follow up tomorrow and further assist with TOC needs as pt progresses. Anticipating pt will need SNF rehab at dc.  Expected Discharge Plan: [place] Barriers to Discharge: Continued Medical Work up  Expected Discharge Plan and Services Expected Discharge Plan: [place] arrangements for the past 2 months: Single Family Home                                       Social Determinants of Health (SDOH) Interventions    Readmission Risk Interventions Readmission Risk Prevention Plan 08/12/2018 06/28/2018  Transportation Screening Complete Complete  Home Care Screening Complete Complete  Some recent data might be hidden

## 2018-08-16 LAB — BASIC METABOLIC PANEL
Anion gap: 13 (ref 5–15)
BUN: 20 mg/dL (ref 8–23)
CO2: 38 mmol/L — ABNORMAL HIGH (ref 22–32)
Calcium: 9.8 mg/dL (ref 8.9–10.3)
Chloride: 89 mmol/L — ABNORMAL LOW (ref 98–111)
Creatinine, Ser: 0.57 mg/dL (ref 0.44–1.00)
GFR calc Af Amer: >60 mL/min (ref >60)
GFR calc non Af Amer: >60 mL/min (ref >60)
Glucose, Bld: 216 mg/dL — ABNORMAL HIGH (ref 70–99)
Potassium: 4.4 mmol/L (ref 3.5–5.1)
Sodium: 140 mmol/L (ref 135–145)

## 2018-08-16 LAB — BLOOD GAS, ARTERIAL
Acid-Base Excess: 18.6 mmol/L — ABNORMAL HIGH (ref 0.0–2.0)
Bicarbonate: 40.5 mmol/L — ABNORMAL HIGH (ref 20.0–28.0)
FIO2: 40
O2 Saturation: 90.2 %
Patient temperature: 37
pCO2 arterial: 68.1 mmHg (ref 32.0–48.0)
pH, Arterial: 7.429 (ref 7.350–7.450)
pO2, Arterial: 61.4 mmHg — ABNORMAL LOW (ref 83.0–108.0)

## 2018-08-16 LAB — GLUCOSE, CAPILLARY
Glucose-Capillary: 229 mg/dL — ABNORMAL HIGH (ref 70–99)
Glucose-Capillary: 361 mg/dL — ABNORMAL HIGH (ref 70–99)
Glucose-Capillary: 379 mg/dL — ABNORMAL HIGH (ref 70–99)
Glucose-Capillary: 136 mg/dL — ABNORMAL HIGH (ref 70–99)
Glucose-Capillary: 287 mg/dL — ABNORMAL HIGH (ref 70–99)

## 2018-08-16 LAB — CBC
HCT: 39.7 % (ref 36.0–46.0)
Hemoglobin: 12 g/dL (ref 12.0–15.0)
MCH: 29.1 pg (ref 26.0–34.0)
MCHC: 30.2 g/dL (ref 30.0–36.0)
MCV: 96.1 fL (ref 80.0–100.0)
Platelets: 249 10*3/uL (ref 150–400)
RBC: 4.13 MIL/uL (ref 3.87–5.11)
RDW: 13.3 % (ref 11.5–15.5)
WBC: 13 10*3/uL — ABNORMAL HIGH (ref 4.0–10.5)
nRBC: 0 % (ref 0.0–0.2)

## 2018-08-16 MED ORDER — INSULIN ASPART 100 UNIT/ML ~~LOC~~ SOLN
10.0000 [IU] | Freq: Three times a day (TID) | SUBCUTANEOUS | Status: DC
  Administered 2018-08-16 – 2018-08-19 (×8): 10 [IU] via SUBCUTANEOUS

## 2018-08-16 NOTE — Progress Notes (Signed)
Patient in for COPD, hx of asthma. Wears 3 to 4 liters oxygen at home Has hx of retaining PCO2 around 70 approximately on past Blood gases. Bicarb around 38 to 40. BiPAP on stand by Patient appears compensated on blood gases even with high PCO2's.

## 2018-08-16 NOTE — Progress Notes (Addendum)
PROGRESS NOTE    [name] Mccort  PJA:[id number] DOB: Mar 24, 1945 DOA: 08/10/2018 PCP: Lavella Lemons, PA   Brief Narrative:  73 year old oxygen and steroid-dependent COPD presented with 3 days of progressive shortness of breath.She was more confused and short of breath on 6/2 prompting transfer to stepdown unit for closer monitoring.  She continued to have some ongoing confusion on 6/3 and required BiPAP due to hypercapnia.  On the morning of 6/4 she is overall doing much better and has remained off BiPAP most of the evening and was transitioned to nasal cannula.  Pulmonology had seen patient given respiratory issues and had increased steroid dose which patient had responded well to.  Assessment & Plan:   Principal Problem:   Acute and chronic respiratory failure (acute-on-chronic) (HCC) Active Problems:   COPD (chronic obstructive pulmonary disease) (HCC)   Essential hypertension   Atrial flutter (HCC)   Diabetes mellitus ([place])   Chronic respiratory failure with hypoxia (HCC)   Left lower lobe pneumonia (HCC)   Leukocytosis 1. Acute on chronic respiratory failure-exacerbated bypneumonia. Continue treating pneumonia with cefepimeand continue supportive therapy.  We will plan to discontinue cefepime after today's dose which will complete 7-day course of treatment. Continue IV steroids and plan to wean by tomorrow. SARS coronavirus 2 was negative.  Patient appears less confused after some confusion noted on 6/3.  PCO2 levels improved on ABG.    Monitor on nasal cannula today.  Recheck ABG earlier today with PCO2 68.1 and overall improved. 2. Chronic respiratory failure/severesteroid and oxygen-dependentCOPD-she is on temporary IV steroids and resumed home bronchodilators.  We will plan to wean by a.m. 3. Left basilar pneumonia-continue antibiotics. Vancomycin DC'd with negative MRSA screen. Continue cefepime through today. Continue supportive therapy. 4. Abdominal distension -  I got an acute abdominal series that is showing large amount of stool in colon.She has had bowel movement on 6/2 after enema. 5. Type 2 diabetes mellitus, insulin requiring with hyperglycemia-she is having some steroid induced hyperglycemia. We have increased her prandial and sliding scale coverage. Continue monitoring CBGs closely and further titrate insulin doses as needed.Likely also steroid-induced. Continue on current dose of steroids and plan to wean once back to baseline. 6. Chronic atrial flutter-we have resumed home cardiac medications and full anticoagulation with Xarelto.  Currently rate controlled.   DVT prophylaxis:Xarelto Code Status:Full Family Communication:We will update daughter on phone-[name] Disposition Plan: Continue stepdown unit observation through today and plan to transfer to floor by a.m. if still improved.  Appreciate pulmonology evaluation and management.  PT evaluation reveals need for SNF which family members are discussing.   Consultants:  Pulmonology  Procedures:  None  Antimicrobials:  Cefepime 5/30-> (d/c after dose today) Vancomycin 5/30-5/31  Subjective: Patient seen and evaluated today with no new acute complaints or concerns. No acute concerns or events noted overnight.  She has remained off BiPAP most of the evening and has been transitioned to 5 L nasal cannula and is overall looking much better this morning.  Objective: Vitals:   08/16/18 0200 08/16/18 0300 08/16/18 0400 08/16/18 0500  BP: (!) 146/87 104/71 108/76 (!) 160/94  Pulse: 79 70 70 90  Resp: 17 19 (!) 26 20  Temp:   98.7 F (37.1 C)   TempSrc:   Oral   SpO2: 94% 93% 93% 93%  Weight:    75.9 kg  Height:        Intake/Output Summary (Last 24 hours) at 08/16/2018 0716 Last data filed at 08/16/2018 0500 Gross  per 24 hour  Intake 253.21 ml  Output 1400 ml  Net -1146.79 ml   Filed Weights   08/14/18 0500 08/15/18 0500 08/16/18 0500  Weight: 78.4 kg 78  kg 75.9 kg    Examination:  General exam: Appears calm and comfortable  Respiratory system: Clear to auscultation. Respiratory effort normal.  Currently on 5 L nasal cannula. Cardiovascular system: S1 & S2 heard, RRR. No JVD, murmurs, rubs, gallops or clicks. No pedal edema. Gastrointestinal system: Abdomen is nondistended, soft and nontender. No organomegaly or masses felt. Normal bowel sounds heard. Central nervous system: Alert and oriented. No focal neurological deficits. Extremities: Symmetric 5 x 5 power. Skin: No rashes, lesions or ulcers Psychiatry: Judgement and insight appear normal. Mood & affect appropriate.     Data Reviewed: I have personally reviewed following labs and imaging studies  CBC: Recent Labs  Lab 08/10/18 1156 08/11/18 0359 08/12/18 0436 08/13/18 0506 08/15/18 0439 08/16/18 0433  WBC 14.1* 11.8* 16.0* 13.8* 14.8* 13.0*  NEUTROABS 12.0* 10.8* 14.7* 12.8*  --   --   HGB 12.5 11.2* 11.1* 11.0* 12.0 12.0  HCT 41.6 38.6 38.2 37.7 40.3 39.7  MCV 98.6 102.1* 101.3* 100.3* 97.6 96.1  PLT 252 238 242 256 260 166   Basic Metabolic Panel: Recent Labs  Lab 08/11/18 0359 08/12/18 0436 08/13/18 0506 08/15/18 0439 08/16/18 0433  NA 143 139 141 141 140  K 4.8 5.1 5.2* 4.2 4.4  CL 99 98 97* 90* 89*  CO2 30 29 34* 38* 38*  GLUCOSE 279* 178* 243* 206* 216*  BUN 15 20 28* 21 20  CREATININE 0.71 0.58 0.64 0.56 0.57  CALCIUM 9.0 8.8* 8.9 9.7 9.8  MG 2.2 2.4 2.4 2.4  --    GFR: Estimated Creatinine Clearance: 59.7 mL/min (by C-G formula based on SCr of 0.57 mg/dL). Liver Function Tests: Recent Labs  Lab 08/11/18 0359 08/12/18 0436 08/13/18 0506  AST 18 20 29   ALT 22 26 37  ALKPHOS 46 46 51  BILITOT 0.6 0.5 0.5  PROT 6.4* 6.2* 6.1*  ALBUMIN 3.8 3.6 3.7   No results for input(s): LIPASE, AMYLASE in the last 168 hours. No results for input(s): AMMONIA in the last 168 hours. Coagulation Profile: No results for input(s): INR, PROTIME in the last 168  hours. Cardiac Enzymes: Recent Labs  Lab 08/10/18 1156  TROPONINI <0.03   BNP (last 3 results) No results for input(s): PROBNP in the last 8760 hours. HbA1C: No results for input(s): HGBA1C in the last 72 hours. CBG: Recent Labs  Lab 08/14/18 2119 08/15/18 0740 08/15/18 1159 08/15/18 1708 08/15/18 2139  GLUCAP 173* 163* 162* 242* 278*   Lipid Profile: No results for input(s): CHOL, HDL, LDLCALC, TRIG, CHOLHDL, LDLDIRECT in the last 72 hours. Thyroid Function Tests: No results for input(s): TSH, T4TOTAL, FREET4, T3FREE, THYROIDAB in the last 72 hours. Anemia Panel: No results for input(s): VITAMINB12, FOLATE, FERRITIN, TIBC, IRON, RETICCTPCT in the last 72 hours. Sepsis Labs: No results for input(s): PROCALCITON, LATICACIDVEN in the last 168 hours.  Recent Results (from the past 240 hour(s))  SARS Coronavirus 2 (CEPHEID - Performed in [place] hospital lab), Hosp Order     Status: None   Collection Time: 08/10/18 11:56 AM  Result Value Ref Range Status   SARS Coronavirus 2 NEGATIVE NEGATIVE Final    Comment: (NOTE) If result is NEGATIVE SARS-CoV-2 target nucleic acids are NOT DETECTED. The SARS-CoV-2 RNA is generally detectable in upper and lower  respiratory specimens during  the acute phase of infection. The lowest  concentration of SARS-CoV-2 viral copies this assay can detect is 250  copies / mL. A negative result does not preclude SARS-CoV-2 infection  and should not be used as the sole basis for treatment or other  patient management decisions.  A negative result may occur with  improper specimen collection / handling, submission of specimen other  than nasopharyngeal swab, presence of viral mutation(s) within the  areas targeted by this assay, and inadequate number of viral copies  (<250 copies / mL). A negative result must be combined with clinical  observations, patient history, and epidemiological information. If result is POSITIVE SARS-CoV-2 target  nucleic acids are DETECTED. The SARS-CoV-2 RNA is generally detectable in upper and lower  respiratory specimens dur ing the acute phase of infection.  Positive  results are indicative of active infection with SARS-CoV-2.  Clinical  correlation with patient history and other diagnostic information is  necessary to determine patient infection status.  Positive results do  not rule out bacterial infection or co-infection with other viruses. If result is PRESUMPTIVE POSTIVE SARS-CoV-2 nucleic acids MAY BE PRESENT.   A presumptive positive result was obtained on the submitted specimen  and confirmed on repeat testing.  While 2019 novel coronavirus  (SARS-CoV-2) nucleic acids may be present in the submitted sample  additional confirmatory testing may be necessary for epidemiological  and / or clinical management purposes  to differentiate between  SARS-CoV-2 and other Sarbecovirus currently known to infect humans.  If clinically indicated additional testing with an alternate test  methodology [phone]) is advised. The SARS-CoV-2 RNA is generally  detectable in upper and lower respiratory sp ecimens during the acute  phase of infection. The expected result is Negative. Fact Sheet for Patients:  StrictlyIdeas.no Fact Sheet for Healthcare Providers: BankingDealers.co.za This test is not yet approved or cleared by the [place] FDA and has been authorized for detection and/or diagnosis of SARS-CoV-2 by FDA under an Emergency Use Authorization (EUA).  This EUA will remain in effect (meaning this test can be used) for the duration of the COVID-19 declaration under Section 564(b)(1) of the Act, 21 U.S.C. section 360bbb-3(b)(1), unless the authorization is terminated or revoked sooner. Performed at [facility], [address] [place], [place] [address]   Culture, blood (routine x 2) Call MD if unable to obtain prior to antibiotics being given      Status: None   Collection Time: 08/10/18  3:19 PM  Result Value Ref Range Status   Specimen Description BLOOD LEFT ARM  Final   Special Requests   Final    BOTTLES DRAWN AEROBIC AND ANAEROBIC Blood Culture adequate volume   Culture   Final    NO GROWTH 5 DAYS Performed at [facility], [address] [place], [place] [address]    Report Status 08/15/2018 FINAL  Final  Culture, blood (routine x 2) Call MD if unable to obtain prior to antibiotics being given     Status: None   Collection Time: 08/10/18  3:28 PM  Result Value Ref Range Status   Specimen Description BLOOD LEFT WRIST  Final   Special Requests   Final    BOTTLES DRAWN AEROBIC AND ANAEROBIC Blood Culture adequate volume   Culture   Final    NO GROWTH 5 DAYS Performed at [facility], [address], [place], [place] [address]    Report Status 08/15/2018 FINAL  Final  MRSA PCR Screening     Status: None  Collection Time: 08/10/18  4:19 PM  Result Value Ref Range Status   MRSA by PCR NEGATIVE NEGATIVE Final    Comment:        The GeneXpert MRSA Assay (FDA approved for NASAL specimens only), is one component of a comprehensive MRSA colonization surveillance program. It is not intended to diagnose MRSA infection nor to guide or monitor treatment for MRSA infections. Performed at [facility], [address], [place], [place] [address]   Culture, Urine     Status: None   Collection Time: 08/13/18  5:25 PM  Result Value Ref Range Status   Specimen Description   Final    URINE, RANDOM Performed at [facility], [address], [place], [place] [address]    Special Requests   Final    NONE Performed at [facility], [address], [place], [place] [address]    Culture   Final    NO GROWTH Performed at [facility], [place] [address], [place], [place] [address]    Report Status 08/15/2018 FINAL  Final         Radiology Studies: Dg Chest Port 1 View  Result Date: 08/15/2018 CLINICAL DATA:  Shortness of  breath. EXAM: PORTABLE CHEST 1 VIEW COMPARISON:  Radiographs of August 13, 2018. FINDINGS: Stable cardiomediastinal silhouette. No pneumothorax is noted. Right lung is clear. Minimal left basilar atelectasis is noted with possible associated pleural effusion. Bony thorax is unremarkable. IMPRESSION: Minimal left basilar subsegmental atelectasis is noted with minimal left pleural effusion. Electronically Signed   By: Marijo Conception M.D.   On: 08/15/2018 09:48        Scheduled Meds: . amiodarone  200 mg Oral Daily  . chlorhexidine  15 mL Mouth Rinse BID  . fluticasone  2 spray Each Nare Daily  . fluticasone furoate-vilanterol  1 puff Inhalation Daily  . furosemide  40 mg Intravenous Daily  . guaiFENesin  1,200 mg Oral BID  . insulin aspart  0-5 Units Subcutaneous QHS  . insulin aspart  0-9 Units Subcutaneous TID WC  . insulin aspart  7 Units Subcutaneous TID WC  . ipratropium-albuterol  3 mL Nebulization QID  . mouth rinse  15 mL Mouth Rinse q12n4p  . methylPREDNISolone (SOLU-MEDROL) injection  60 mg Intravenous Q8H  . metoprolol tartrate  50 mg Oral BID  . pneumococcal 23 valent vaccine  0.5 mL Intramuscular Tomorrow-1000  . rivaroxaban  20 mg Oral Q supper  . senna-docusate  1 tablet Oral BID   Continuous Infusions: . sodium chloride 10 mL/hr at 08/16/18 0440  . ceFEPime (MAXIPIME) IV 2 g (08/16/18 0541)     LOS: 6 days    Time spent: 30 minutes    Ziyan Schoon Darleen Crocker, DO [facility] Pager [phone]  If 7PM-7AM, please contact night-coverage [email or web address] Password [email or web address] 08/16/2018, 7:16 AM

## 2018-08-16 NOTE — Care Management Important Message (Signed)
Important Message  Patient Details  Name: Eileen [name] MRN: [id number] Date of Birth: 1945/03/17   Medicare Important Message Given:  Yes    [name] 08/16/2018, 1:42 PM

## 2018-08-16 NOTE — Progress Notes (Signed)
Inpatient Diabetes Program Recommendations  AACE/ADA: New Consensus Statement on Inpatient Glycemic Control (2015)  Target Ranges:  Prepandial:   less than 140 mg/dL      Peak postprandial:   less than 180 mg/dL (1-2 hours)      Critically ill patients:  140 - 180 mg/dL   Lab Results  Component Value Date   GLUCAP 379 (H) 08/16/2018   HGBA1C 8.6 (H) 06/24/2018    Review of Glycemic Control Results for Eileen [name], Eileen [name] (MRN [id number]) as of 08/16/2018 15:59  Ref. Range 08/15/2018 21:39 08/16/2018 07:53 08/16/2018 11:20 08/16/2018 11:21  Glucose-Capillary Latest Ref Range: 70 - 99 mg/dL 278 (H) 229 (H) 361 (H) 379 (H)   Diabetes history: type 2 DM Outpatient Diabetes medications: Metformin 500 mg QD,  Current orders for Inpatient glycemic control: Novolog 0-9 units TID, novolog 0-5 units QHS, novolog 7 units TID Solumedrol increased to 60 mg Q8H Inpatient Diabetes Program Recommendations:    Noted steroids tapered up until tomorrow and plan for insulin increase per MD note.  Consider increasing correction to Novolog 0-15 units TID and increasing meal coverage to Novolog 10 units TID (assuming patient is consuming >50% of meal; anticipate need for decrease once steroids are discontinued). Secure chat sent for order changes given increase in glucose trends.   Thanks, Bronson Curb, MSN, RNC-OB Diabetes Coordinator [phone] (8a-5p)

## 2018-08-16 NOTE — Progress Notes (Signed)
Subjective: She says she feels much better.  She has no new complaints.  Her blood gases improved but not markedly so but she looks much better.  Objective: Vital signs in last 24 hours: Temp:  [98.4 F (36.9 C)-98.7 F (37.1 C)] 98.7 F (37.1 C) (06/05 0400) Pulse Rate:  [70-111] 90 (06/05 0500) Resp:  [17-32] 20 (06/05 0500) BP: (101-179)/(69-109) 160/94 (06/05 0500) SpO2:  [65 %-100 %] 93 % (06/05 0500) FiO2 (%):  [35 %] 35 % (06/04 1532) Weight:  [75.9 kg] 75.9 kg (06/05 0500) Weight change: -2.1 kg Last BM Date: 08/14/18  Intake/Output from previous day: 06/04 0701 - 06/05 0700 In: 253.2 [I.V.:53.1; IV Piggyback:200.1] Out: 1400 [Urine:1400]  PHYSICAL EXAM General appearance: alert, cooperative and no distress Resp: rhonchi bilaterally Cardio: regular rate and rhythm, S1, S2 normal, no murmur, click, rub or gallop GI: soft, non-tender; bowel sounds normal; no masses,  no organomegaly Extremities: extremities normal, atraumatic, no cyanosis or edema  Lab Results:  Results for orders placed or performed during the hospital encounter of 08/10/18 (from the past 48 hour(s))  Glucose, capillary     Status: Abnormal   Collection Time: 08/14/18  8:08 AM  Result Value Ref Range   Glucose-Capillary 166 (H) 70 - 99 mg/dL  Glucose, capillary     Status: Abnormal   Collection Time: 08/14/18 11:23 AM  Result Value Ref Range   Glucose-Capillary 157 (H) 70 - 99 mg/dL  Blood gas, arterial     Status: Abnormal   Collection Time: 08/14/18  1:50 PM  Result Value Ref Range   FIO2 32.00    pH, Arterial 7.335 (L) 7.350 - 7.450   pCO2 arterial 87.8 (HH) 32.0 - 48.0 mmHg    Comment: CRITICAL RESULT CALLED TO, READ BACK BY AND VERIFIED WITH: PINNIX, H. @1413  08/14/2018 KAY    pO2, Arterial 62.2 (L) 83.0 - 108.0 mmHg   Bicarbonate 39.9 (H) 20.0 - 28.0 mmol/L   Acid-Base Excess 18.8 (H) 0.0 - 2.0 mmol/L   O2 Saturation 89.0 %   Patient temperature 36.7    Allens test (pass/fail) PASS  PASS    Comment: Performed at [facility], [address], [place], [place] [address]  Glucose, capillary     Status: None   Collection Time: 08/14/18  4:24 PM  Result Value Ref Range   Glucose-Capillary 84 70 - 99 mg/dL  Glucose, capillary     Status: Abnormal   Collection Time: 08/14/18  9:19 PM  Result Value Ref Range   Glucose-Capillary 173 (H) 70 - 99 mg/dL  Basic metabolic panel     Status: Abnormal   Collection Time: 08/15/18  4:39 AM  Result Value Ref Range   Sodium 141 135 - 145 mmol/L   Potassium 4.2 3.5 - 5.1 mmol/L    Comment: DELTA CHECK NOTED   Chloride 90 (L) 98 - 111 mmol/L   CO2 38 (H) 22 - 32 mmol/L   Glucose, Bld 206 (H) 70 - 99 mg/dL   BUN 21 8 - 23 mg/dL   Creatinine, Ser 0.56 0.44 - 1.00 mg/dL   Calcium 9.7 8.9 - 10.3 mg/dL   GFR calc non Af Amer >60 >60 mL/min   GFR calc Af Amer >60 >60 mL/min   Anion gap 13 5 - 15    Comment: Performed at [facility], [address] [place], [place] [address]  Magnesium     Status: None   Collection Time: 08/15/18  4:39 AM  Result Value  Ref Range   Magnesium 2.4 1.7 - 2.4 mg/dL    Comment: Performed at [facility], [address], [place], [place] [address]  CBC     Status: Abnormal   Collection Time: 08/15/18  4:39 AM  Result Value Ref Range   WBC 14.8 (H) 4.0 - 10.5 K/uL   RBC 4.13 3.87 - 5.11 MIL/uL   Hemoglobin 12.0 12.0 - 15.0 g/dL   HCT 40.3 36.0 - 46.0 %   MCV 97.6 80.0 - 100.0 fL   MCH 29.1 26.0 - 34.0 pg   MCHC 29.8 (L) 30.0 - 36.0 g/dL   RDW 13.5 11.5 - 15.5 %   Platelets 260 150 - 400 K/uL   nRBC 0.0 0.0 - 0.2 %    Comment: Performed at [facility], [address], [place], [place] [address]  Blood gas, arterial     Status: Abnormal   Collection Time: 08/15/18  5:00 AM  Result Value Ref Range   FIO2 35.00    pH, Arterial 7.412 7.350 - 7.450   pCO2 arterial 72.6 (HH) 32.0 - 48.0 mmHg    Comment: CRITICAL RESULT CALLED TO, READ BACK BY AND VERIFIED WITH: DANIELS,J AT 6:35AM ON 08/15/18 BY  FESTERMAN,C    pO2, Arterial 61.5 (L) 83.0 - 108.0 mmHg   Bicarbonate 41.3 (H) 20.0 - 28.0 mmol/L   Acid-Base Excess 19.3 (H) 0.0 - 2.0 mmol/L   O2 Saturation 90.6 %   Patient temperature 37.0    Allens test (pass/fail) PASS PASS    Comment: Performed at [facility], [address], [place], [place] [address]  Glucose, capillary     Status: Abnormal   Collection Time: 08/15/18  7:40 AM  Result Value Ref Range   Glucose-Capillary 163 (H) 70 - 99 mg/dL  Glucose, capillary     Status: Abnormal   Collection Time: 08/15/18 11:59 AM  Result Value Ref Range   Glucose-Capillary 162 (H) 70 - 99 mg/dL  Glucose, capillary     Status: Abnormal   Collection Time: 08/15/18  5:08 PM  Result Value Ref Range   Glucose-Capillary 242 (H) 70 - 99 mg/dL  Glucose, capillary     Status: Abnormal   Collection Time: 08/15/18  9:39 PM  Result Value Ref Range   Glucose-Capillary 278 (H) 70 - 99 mg/dL  Basic metabolic panel     Status: Abnormal   Collection Time: 08/16/18  4:33 AM  Result Value Ref Range   Sodium 140 135 - 145 mmol/L   Potassium 4.4 3.5 - 5.1 mmol/L   Chloride 89 (L) 98 - 111 mmol/L   CO2 38 (H) 22 - 32 mmol/L   Glucose, Bld 216 (H) 70 - 99 mg/dL   BUN 20 8 - 23 mg/dL   Creatinine, Ser 0.57 0.44 - 1.00 mg/dL   Calcium 9.8 8.9 - 10.3 mg/dL   GFR calc non Af Amer >60 >60 mL/min   GFR calc Af Amer >60 >60 mL/min   Anion gap 13 5 - 15    Comment: Performed at [facility], [address] [place], [place] [address]  CBC     Status: Abnormal   Collection Time: 08/16/18  4:33 AM  Result Value Ref Range   WBC 13.0 (H) 4.0 - 10.5 K/uL   RBC 4.13 3.87 - 5.11 MIL/uL   Hemoglobin 12.0 12.0 - 15.0 g/dL   HCT 39.7 36.0 - 46.0 %   MCV 96.1 80.0 - 100.0 fL   MCH 29.1 26.0 -  34.0 pg   MCHC 30.2 30.0 - 36.0 g/dL   RDW 13.3 11.5 - 15.5 %   Platelets 249 150 - 400 K/uL   nRBC 0.0 0.0 - 0.2 %    Comment: Performed at [facility], [address], [place], [place] [address]  Blood gas, arterial      Status: Abnormal   Collection Time: 08/16/18  6:30 AM  Result Value Ref Range   FIO2 40.00    pH, Arterial 7.429 7.350 - 7.450   pCO2 arterial 68.1 (HH) 32.0 - 48.0 mmHg    Comment: CRITICAL RESULT CALLED TO, READ BACK BY AND VERIFIED WITH: HERNS @ 0643 ON [id number] BY HENDERSON    pO2, Arterial 61.4 (L) 83.0 - 108.0 mmHg   Bicarbonate 40.5 (H) 20.0 - 28.0 mmol/L   Acid-Base Excess 18.6 (H) 0.0 - 2.0 mmol/L   O2 Saturation 90.2 %   Patient temperature 37.0    Allens test (pass/fail) PASS PASS    Comment: Performed at [facility], [address] [place], [place] [address]    ABGS Recent Labs    08/16/18 0630  PHART 7.429  PO2ART 61.4*  HCO3 40.5*   CULTURES Recent Results (from the past 240 hour(s))  SARS Coronavirus 2 (CEPHEID - Performed in [place] hospital lab), Hosp Order     Status: None   Collection Time: 08/10/18 11:56 AM  Result Value Ref Range Status   SARS Coronavirus 2 NEGATIVE NEGATIVE Final    Comment: (NOTE) If result is NEGATIVE SARS-CoV-2 target nucleic acids are NOT DETECTED. The SARS-CoV-2 RNA is generally detectable in upper and lower  respiratory specimens during the acute phase of infection. The lowest  concentration of SARS-CoV-2 viral copies this assay can detect is 250  copies / mL. A negative result does not preclude SARS-CoV-2 infection  and should not be used as the sole basis for treatment or other  patient management decisions.  A negative result may occur with  improper specimen collection / handling, submission of specimen other  than nasopharyngeal swab, presence of viral mutation(s) within the  areas targeted by this assay, and inadequate number of viral copies  (<250 copies / mL). A negative result must be combined with clinical  observations, patient history, and epidemiological information. If result is POSITIVE SARS-CoV-2 target nucleic acids are DETECTED. The SARS-CoV-2 RNA is generally detectable in upper and lower   respiratory specimens dur ing the acute phase of infection.  Positive  results are indicative of active infection with SARS-CoV-2.  Clinical  correlation with patient history and other diagnostic information is  necessary to determine patient infection status.  Positive results do  not rule out bacterial infection or co-infection with other viruses. If result is PRESUMPTIVE POSTIVE SARS-CoV-2 nucleic acids MAY BE PRESENT.   A presumptive positive result was obtained on the submitted specimen  and confirmed on repeat testing.  While 2019 novel coronavirus  (SARS-CoV-2) nucleic acids may be present in the submitted sample  additional confirmatory testing may be necessary for epidemiological  and / or clinical management purposes  to differentiate between  SARS-CoV-2 and other Sarbecovirus currently known to infect humans.  If clinically indicated additional testing with an alternate test  methodology [phone]) is advised. The SARS-CoV-2 RNA is generally  detectable in upper and lower respiratory sp ecimens during the acute  phase of infection. The expected result is Negative. Fact Sheet for Patients:  StrictlyIdeas.no Fact Sheet for Healthcare Providers: BankingDealers.co.za This test is not yet approved  or cleared by the [place] and has been authorized for detection and/or diagnosis of SARS-CoV-2 by FDA under an Emergency Use Authorization (EUA).  This EUA will remain in effect (meaning this test can be used) for the duration of the COVID-19 declaration under Section 564(b)(1) of the Act, 21 U.S.C. section 360bbb-3(b)(1), unless the authorization is terminated or revoked sooner. Performed at [facility], [address] [place], [place] [address]   Culture, blood (routine x 2) Call MD if unable to obtain prior to antibiotics being given     Status: None   Collection Time: 08/10/18  3:19 PM  Result Value Ref Range Status    Specimen Description BLOOD LEFT ARM  Final   Special Requests   Final    BOTTLES DRAWN AEROBIC AND ANAEROBIC Blood Culture adequate volume   Culture   Final    NO GROWTH 5 DAYS Performed at [facility], [address], [place], [place] [address]    Report Status 08/15/2018 FINAL  Final  Culture, blood (routine x 2) Call MD if unable to obtain prior to antibiotics being given     Status: None   Collection Time: 08/10/18  3:28 PM  Result Value Ref Range Status   Specimen Description BLOOD LEFT WRIST  Final   Special Requests   Final    BOTTLES DRAWN AEROBIC AND ANAEROBIC Blood Culture adequate volume   Culture   Final    NO GROWTH 5 DAYS Performed at [facility], [address] [place], [place] [address]    Report Status 08/15/2018 FINAL  Final  MRSA PCR Screening     Status: None   Collection Time: 08/10/18  4:19 PM  Result Value Ref Range Status   MRSA by PCR NEGATIVE NEGATIVE Final    Comment:        The GeneXpert MRSA Assay (FDA approved for NASAL specimens only), is one component of a comprehensive MRSA colonization surveillance program. It is not intended to diagnose MRSA infection nor to guide or monitor treatment for MRSA infections. Performed at [facility], [address], [place], [place] [address]   Culture, Urine     Status: None   Collection Time: 08/13/18  5:25 PM  Result Value Ref Range Status   Specimen Description   Final    URINE, RANDOM Performed at [facility], [address], [place], [place] [address]    Special Requests   Final    NONE Performed at [facility], [address] [place], [place] [address]    Culture   Final    NO GROWTH Performed at [facility], [place] [address] [place], [place] [address]    Report Status 08/15/2018 FINAL  Final   Studies/Results: Dg Chest Port 1 View  Result Date: 08/15/2018 CLINICAL DATA:  Shortness of breath. EXAM: PORTABLE CHEST 1 VIEW COMPARISON:  Radiographs of August 13, 2018. FINDINGS: Stable  cardiomediastinal silhouette. No pneumothorax is noted. Right lung is clear. Minimal left basilar atelectasis is noted with possible associated pleural effusion. Bony thorax is unremarkable. IMPRESSION: Minimal left basilar subsegmental atelectasis is noted with minimal left pleural effusion. Electronically Signed   By: Marijo Conception M.D.   On: 08/15/2018 09:48    Medications:  Prior to Admission:  Medications Prior to Admission  Medication Sig Dispense Refill Last Dose  . acetaminophen (TYLENOL) 500 MG tablet Take 500 mg by mouth every 8 (eight) hours as needed for mild pain or headache.    Taking  .  albuterol (PROVENTIL) (2.5 MG/3ML) 0.083% nebulizer solution Take 2.5 mg by nebulization every 6 (six) hours as needed for wheezing or shortness of breath.   Taking  . amiodarone (PACERONE) 200 MG tablet Take 1 tablet (200 mg total) by mouth daily. 90 tablet 3 08/09/2018 at Unknown time  . blood glucose meter kit and supplies KIT Dispense based on patient and insurance preference. Use up to four times daily as directed. (FOR ICD-9 250.00, 250.01).  Check sugar before meals and at night, keep a log. 1 each 0 Taking  . furosemide (LASIX) 40 MG tablet 20 mg daily.    Taking  . guaiFENesin (MUCINEX) 600 MG 12 hr tablet Take 1 tablet (600 mg total) by mouth 2 (two) times daily. 30 tablet 0 Taking  . meclizine (ANTIVERT) 12.5 MG tablet Take 1 tablet by mouth 3 (three) times daily as needed.   Taking  . metFORMIN (GLUCOPHAGE-XR) 500 MG 24 hr tablet Take 500 mg by mouth daily.  1 08/09/2018 at Unknown time  . metoprolol tartrate (LOPRESSOR) 50 MG tablet Take 50 mg by mouth 2 (two) times daily.   08/10/2018 at 830  . Multiple Vitamins-Minerals (EMERGEN-C IMMUNE) PACK Take 1 Package by mouth daily.   08/09/2018 at Unknown time  . OXYGEN Inhale 2.5-3 L into the lungs daily. At night and daily as needed   Taking  . predniSONE (DELTASONE) 10 MG tablet Take 10 mg by mouth daily.   08/10/2018 at Unknown time  .  PROAIR HFA 108 (90 BASE) MCG/ACT inhaler Inhale 2 puffs into the lungs every 6 (six) hours as needed for wheezing or shortness of breath.    Taking  . psyllium (HYDROCIL/METAMUCIL) 95 % PACK Take 1 packet by mouth daily. (Patient taking differently: Take 1 packet by mouth daily as needed. ) 240 each 0 Taking  . rivaroxaban (XARELTO) 20 MG TABS tablet Take 1 tablet (20 mg total) by mouth daily with supper. 21 tablet 0 08/10/2018 at 830  . TRELEGY ELLIPTA 100-62.5-25 MCG/INH AEPB Take 1 puff by mouth daily.  0 08/10/2018 at Unknown time  . Vitamin D, Ergocalciferol, (DRISDOL) 50000 units CAPS capsule Take 50,000 Units every 30 (thirty) days by mouth.   Past Month at Unknown time   Scheduled: . amiodarone  200 mg Oral Daily  . chlorhexidine  15 mL Mouth Rinse BID  . fluticasone  2 spray Each Nare Daily  . fluticasone furoate-vilanterol  1 puff Inhalation Daily  . furosemide  40 mg Intravenous Daily  . guaiFENesin  1,200 mg Oral BID  . insulin aspart  0-5 Units Subcutaneous QHS  . insulin aspart  0-9 Units Subcutaneous TID WC  . insulin aspart  7 Units Subcutaneous TID WC  . ipratropium-albuterol  3 mL Nebulization QID  . mouth rinse  15 mL Mouth Rinse q12n4p  . methylPREDNISolone (SOLU-MEDROL) injection  60 mg Intravenous Q8H  . metoprolol tartrate  50 mg Oral BID  . pneumococcal 23 valent vaccine  0.5 mL Intramuscular Tomorrow-1000  . rivaroxaban  20 mg Oral Q supper  . senna-docusate  1 tablet Oral BID   Continuous: . sodium chloride 10 mL/hr at 08/16/18 0440  . ceFEPime (MAXIPIME) IV 2 g (08/16/18 0541)   AYT:KZSWFU chloride, acetaminophen, albuterol, meclizine  Assesment: She was admitted with acute on chronic hypoxic and hypercapnic respiratory failure.  She has severe COPD and had pneumonia.  Chest x-ray shows pneumonia has basically cleared.  She had increasing CO2 retention with concern that she was going  to require intubation and mechanical ventilation but she has improved.  She  has history of atrial flutter and she has well controlled heart rate at this point.  She has diabetes on insulin and with the increased steroids she has had more hyperglycemia Principal Problem:   Acute and chronic respiratory failure (acute-on-chronic) (HCC) Active Problems:   COPD (chronic obstructive pulmonary disease) (HCC)   Essential hypertension   Atrial flutter (HCC)   Diabetes mellitus ([place])   Chronic respiratory failure with hypoxia (HCC)   Left lower lobe pneumonia (HCC)   Leukocytosis    Plan: Discussed with Dr. Brigitte Pulse agree with continuing on the higher dose of IV steroids today with plans to taper tomorrow.    LOS: 6 days   Alonza Bogus 08/16/2018, 7:37 AM

## 2018-08-17 LAB — GLUCOSE, CAPILLARY
Glucose-Capillary: 209 mg/dL — ABNORMAL HIGH (ref 70–99)
Glucose-Capillary: 287 mg/dL — ABNORMAL HIGH (ref 70–99)
Glucose-Capillary: 153 mg/dL — ABNORMAL HIGH (ref 70–99)
Glucose-Capillary: 166 mg/dL — ABNORMAL HIGH (ref 70–99)

## 2018-08-17 MED ORDER — CHLORHEXIDINE GLUCONATE CLOTH 2 % EX PADS
6.0000 | MEDICATED_PAD | Freq: Every day | CUTANEOUS | Status: DC
  Administered 2018-08-17: 08:00:00 6 via TOPICAL

## 2018-08-17 MED ORDER — IBUPROFEN 800 MG PO TABS
800.0000 mg | ORAL_TABLET | Freq: Four times a day (QID) | ORAL | Status: DC | PRN
  Administered 2018-08-17 – 2018-08-19 (×4): 800 mg via ORAL
  Filled 2018-08-17 (×4): qty 1

## 2018-08-17 MED ORDER — METHYLPREDNISOLONE SODIUM SUCC 125 MG IJ SOLR
60.0000 mg | Freq: Two times a day (BID) | INTRAMUSCULAR | Status: DC
  Administered 2018-08-17 – 2018-08-18 (×2): 60 mg via INTRAVENOUS
  Filled 2018-08-17 (×2): qty 2

## 2018-08-17 NOTE — Progress Notes (Signed)
Subjective: She says she feels well with no new complaints.  She is still somewhat short of breath but much improved.  She is on nasal cannula with good oxygenation on about 4 L.  No complaints.  She did not use BiPAP last night  Objective: Vital signs in last 24 hours: Temp:  [98 F (36.7 C)-98.2 F (36.8 C)] 98.2 F (36.8 C) (06/06 0759) Pulse Rate:  [69-125] 77 (06/06 0759) Resp:  [16-28] 17 (06/06 0759) BP: (93-140)/(61-81) 110/68 (06/06 0105) SpO2:  [94 %-100 %] 100 % (06/06 0759) Weight:  [76.9 kg] 76.9 kg (06/06 0441) Weight change: 1 kg Last BM Date: 08/15/18  Intake/Output from previous day: 06/05 0701 - 06/06 0700 In: 360 [P.O.:360] Out: -   PHYSICAL EXAM General appearance: alert, cooperative and no distress Resp: rhonchi bilaterally Cardio: Mildly irregular heart rate GI: soft, non-tender; bowel sounds normal; no masses,  no organomegaly Extremities: extremities normal, atraumatic, no cyanosis or edema  Lab Results:  Results for orders placed or performed during the hospital encounter of 08/10/18 (from the past 48 hour(s))  Glucose, capillary     Status: Abnormal   Collection Time: 08/15/18 11:59 AM  Result Value Ref Range   Glucose-Capillary 162 (H) 70 - 99 mg/dL  Glucose, capillary     Status: Abnormal   Collection Time: 08/15/18  5:08 PM  Result Value Ref Range   Glucose-Capillary 242 (H) 70 - 99 mg/dL  Glucose, capillary     Status: Abnormal   Collection Time: 08/15/18  9:39 PM  Result Value Ref Range   Glucose-Capillary 278 (H) 70 - 99 mg/dL  Basic metabolic panel     Status: Abnormal   Collection Time: 08/16/18  4:33 AM  Result Value Ref Range   Sodium 140 135 - 145 mmol/L   Potassium 4.4 3.5 - 5.1 mmol/L   Chloride 89 (L) 98 - 111 mmol/L   CO2 38 (H) 22 - 32 mmol/L   Glucose, Bld 216 (H) 70 - 99 mg/dL   BUN 20 8 - 23 mg/dL   Creatinine, Ser 0.57 0.44 - 1.00 mg/dL   Calcium 9.8 8.9 - 10.3 mg/dL   GFR calc non Af Amer >60 >60 mL/min   GFR calc  Af Amer >60 >60 mL/min   Anion gap 13 5 - 15    Comment: Performed at [facility], [address] [place], [place] [address]  CBC     Status: Abnormal   Collection Time: 08/16/18  4:33 AM  Result Value Ref Range   WBC 13.0 (H) 4.0 - 10.5 K/uL   RBC 4.13 3.87 - 5.11 MIL/uL   Hemoglobin 12.0 12.0 - 15.0 g/dL   HCT 39.7 36.0 - 46.0 %   MCV 96.1 80.0 - 100.0 fL   MCH 29.1 26.0 - 34.0 pg   MCHC 30.2 30.0 - 36.0 g/dL   RDW 13.3 11.5 - 15.5 %   Platelets 249 150 - 400 K/uL   nRBC 0.0 0.0 - 0.2 %    Comment: Performed at [facility], [address], [place], [place] [address]  Blood gas, arterial     Status: Abnormal   Collection Time: 08/16/18  6:30 AM  Result Value Ref Range   FIO2 40.00    pH, Arterial 7.429 7.350 - 7.450   pCO2 arterial 68.1 (HH) 32.0 - 48.0 mmHg    Comment: CRITICAL RESULT CALLED TO, READ BACK BY AND VERIFIED WITH: HERNS @ 0643 ON [id number] BY HENDERSON    pO2,  Arterial 61.4 (L) 83.0 - 108.0 mmHg   Bicarbonate 40.5 (H) 20.0 - 28.0 mmol/L   Acid-Base Excess 18.6 (H) 0.0 - 2.0 mmol/L   O2 Saturation 90.2 %   Patient temperature 37.0    Allens test (pass/fail) PASS PASS    Comment: Performed at [facility], [address], [place], [place] [address]  Glucose, capillary     Status: Abnormal   Collection Time: 08/16/18  7:53 AM  Result Value Ref Range   Glucose-Capillary 229 (H) 70 - 99 mg/dL  Glucose, capillary     Status: Abnormal   Collection Time: 08/16/18 11:20 AM  Result Value Ref Range   Glucose-Capillary 361 (H) 70 - 99 mg/dL  Glucose, capillary     Status: Abnormal   Collection Time: 08/16/18 11:21 AM  Result Value Ref Range   Glucose-Capillary 379 (H) 70 - 99 mg/dL  Glucose, capillary     Status: Abnormal   Collection Time: 08/16/18  4:28 PM  Result Value Ref Range   Glucose-Capillary 136 (H) 70 - 99 mg/dL  Glucose, capillary     Status: Abnormal   Collection Time: 08/16/18  9:01 PM  Result Value Ref Range   Glucose-Capillary 287 (H) 70 - 99  mg/dL   Comment 1 Notify RN    Comment 2 Document in Chart   Glucose, capillary     Status: Abnormal   Collection Time: 08/17/18  7:57 AM  Result Value Ref Range   Glucose-Capillary 209 (H) 70 - 99 mg/dL    ABGS Recent Labs    08/16/18 0630  PHART 7.429  PO2ART 61.4*  HCO3 40.5*   CULTURES Recent Results (from the past 240 hour(s))  SARS Coronavirus 2 (CEPHEID - Performed in [place] hospital lab), Hosp Order     Status: None   Collection Time: 08/10/18 11:56 AM  Result Value Ref Range Status   SARS Coronavirus 2 NEGATIVE NEGATIVE Final    Comment: (NOTE) If result is NEGATIVE SARS-CoV-2 target nucleic acids are NOT DETECTED. The SARS-CoV-2 RNA is generally detectable in upper and lower  respiratory specimens during the acute phase of infection. The lowest  concentration of SARS-CoV-2 viral copies this assay can detect is 250  copies / mL. A negative result does not preclude SARS-CoV-2 infection  and should not be used as the sole basis for treatment or other  patient management decisions.  A negative result may occur with  improper specimen collection / handling, submission of specimen other  than nasopharyngeal swab, presence of viral mutation(s) within the  areas targeted by this assay, and inadequate number of viral copies  (<250 copies / mL). A negative result must be combined with clinical  observations, patient history, and epidemiological information. If result is POSITIVE SARS-CoV-2 target nucleic acids are DETECTED. The SARS-CoV-2 RNA is generally detectable in upper and lower  respiratory specimens dur ing the acute phase of infection.  Positive  results are indicative of active infection with SARS-CoV-2.  Clinical  correlation with patient history and other diagnostic information is  necessary to determine patient infection status.  Positive results do  not rule out bacterial infection or co-infection with other viruses. If result is PRESUMPTIVE  POSTIVE SARS-CoV-2 nucleic acids MAY BE PRESENT.   A presumptive positive result was obtained on the submitted specimen  and confirmed on repeat testing.  While 2019 novel coronavirus  (SARS-CoV-2) nucleic acids may be present in the submitted sample  additional confirmatory testing may be necessary for epidemiological  and / or clinical management purposes  to differentiate between  SARS-CoV-2 and other Sarbecovirus currently known to infect humans.  If clinically indicated additional testing with an alternate test  methodology [phone]) is advised. The SARS-CoV-2 RNA is generally  detectable in upper and lower respiratory sp ecimens during the acute  phase of infection. The expected result is Negative. Fact Sheet for Patients:  StrictlyIdeas.no Fact Sheet for Healthcare Providers: BankingDealers.co.za This test is not yet approved or cleared by the [place] FDA and has been authorized for detection and/or diagnosis of SARS-CoV-2 by FDA under an Emergency Use Authorization (EUA).  This EUA will remain in effect (meaning this test can be used) for the duration of the COVID-19 declaration under Section 564(b)(1) of the Act, 21 U.S.C. section 360bbb-3(b)(1), unless the authorization is terminated or revoked sooner. Performed at [facility], [address] [place], [place] [address]   Culture, blood (routine x 2) Call MD if unable to obtain prior to antibiotics being given     Status: None   Collection Time: 08/10/18  3:19 PM  Result Value Ref Range Status   Specimen Description BLOOD LEFT ARM  Final   Special Requests   Final    BOTTLES DRAWN AEROBIC AND ANAEROBIC Blood Culture adequate volume   Culture   Final    NO GROWTH 5 DAYS Performed at [facility], [address], [place], [place] [address]    Report Status 08/15/2018 FINAL  Final  Culture, blood (routine x 2) Call MD if unable to obtain prior to antibiotics being given      Status: None   Collection Time: 08/10/18  3:28 PM  Result Value Ref Range Status   Specimen Description BLOOD LEFT WRIST  Final   Special Requests   Final    BOTTLES DRAWN AEROBIC AND ANAEROBIC Blood Culture adequate volume   Culture   Final    NO GROWTH 5 DAYS Performed at [facility], [address] [place], [place] [address]    Report Status 08/15/2018 FINAL  Final  MRSA PCR Screening     Status: None   Collection Time: 08/10/18  4:19 PM  Result Value Ref Range Status   MRSA by PCR NEGATIVE NEGATIVE Final    Comment:        The GeneXpert MRSA Assay (FDA approved for NASAL specimens only), is one component of a comprehensive MRSA colonization surveillance program. It is not intended to diagnose MRSA infection nor to guide or monitor treatment for MRSA infections. Performed at [facility], [address], [place], [place] [address]   Culture, Urine     Status: None   Collection Time: 08/13/18  5:25 PM  Result Value Ref Range Status   Specimen Description   Final    URINE, RANDOM Performed at [facility], [address] [place], [place] [address]    Special Requests   Final    NONE Performed at [facility], [address], [place], [place] [address]    Culture   Final    NO GROWTH Performed at [facility], [place] [address], [place], [place] [address]    Report Status 08/15/2018 FINAL  Final   Studies/Results: Dg Chest Port 1 View  Result Date: 08/15/2018 CLINICAL DATA:  Shortness of breath. EXAM: PORTABLE CHEST 1 VIEW COMPARISON:  Radiographs of August 13, 2018. FINDINGS: Stable cardiomediastinal silhouette. No pneumothorax is noted. Right lung is clear. Minimal left basilar atelectasis is noted with possible associated pleural effusion. Bony  thorax is unremarkable. IMPRESSION: Minimal left basilar subsegmental atelectasis is noted with minimal left pleural effusion. Electronically Signed   By: Marijo Conception M.D.   On: 08/15/2018 09:48    Medications:  Prior  to Admission:  Medications Prior to Admission  Medication Sig Dispense Refill Last Dose  . acetaminophen (TYLENOL) 500 MG tablet Take 500 mg by mouth every 8 (eight) hours as needed for mild pain or headache.    Taking  . albuterol (PROVENTIL) (2.5 MG/3ML) 0.083% nebulizer solution Take 2.5 mg by nebulization every 6 (six) hours as needed for wheezing or shortness of breath.   Taking  . amiodarone (PACERONE) 200 MG tablet Take 1 tablet (200 mg total) by mouth daily. 90 tablet 3 08/09/2018 at Unknown time  . blood glucose meter kit and supplies KIT Dispense based on patient and insurance preference. Use up to four times daily as directed. (FOR ICD-9 250.00, 250.01).  Check sugar before meals and at night, keep a log. 1 each 0 Taking  . furosemide (LASIX) 40 MG tablet 20 mg daily.    Taking  . guaiFENesin (MUCINEX) 600 MG 12 hr tablet Take 1 tablet (600 mg total) by mouth 2 (two) times daily. 30 tablet 0 Taking  . meclizine (ANTIVERT) 12.5 MG tablet Take 1 tablet by mouth 3 (three) times daily as needed.   Taking  . metFORMIN (GLUCOPHAGE-XR) 500 MG 24 hr tablet Take 500 mg by mouth daily.  1 08/09/2018 at Unknown time  . metoprolol tartrate (LOPRESSOR) 50 MG tablet Take 50 mg by mouth 2 (two) times daily.   08/10/2018 at 830  . Multiple Vitamins-Minerals (EMERGEN-C IMMUNE) PACK Take 1 Package by mouth daily.   08/09/2018 at Unknown time  . OXYGEN Inhale 2.5-3 L into the lungs daily. At night and daily as needed   Taking  . predniSONE (DELTASONE) 10 MG tablet Take 10 mg by mouth daily.   08/10/2018 at Unknown time  . PROAIR HFA 108 (90 BASE) MCG/ACT inhaler Inhale 2 puffs into the lungs every 6 (six) hours as needed for wheezing or shortness of breath.    Taking  . psyllium (HYDROCIL/METAMUCIL) 95 % PACK Take 1 packet by mouth daily. (Patient taking differently: Take 1 packet by mouth daily as needed. ) 240 each 0 Taking  . rivaroxaban (XARELTO) 20 MG TABS tablet Take 1 tablet (20 mg total) by mouth daily  with supper. 21 tablet 0 08/10/2018 at 830  . TRELEGY ELLIPTA 100-62.5-25 MCG/INH AEPB Take 1 puff by mouth daily.  0 08/10/2018 at Unknown time  . Vitamin D, Ergocalciferol, (DRISDOL) 50000 units CAPS capsule Take 50,000 Units every 30 (thirty) days by mouth.   Past Month at Unknown time   Scheduled: . amiodarone  200 mg Oral Daily  . chlorhexidine  15 mL Mouth Rinse BID  . Chlorhexidine Gluconate Cloth  6 each Topical Daily  . fluticasone  2 spray Each Nare Daily  . fluticasone furoate-vilanterol  1 puff Inhalation Daily  . furosemide  40 mg Intravenous Daily  . guaiFENesin  1,200 mg Oral BID  . insulin aspart  0-5 Units Subcutaneous QHS  . insulin aspart  0-9 Units Subcutaneous TID WC  . insulin aspart  10 Units Subcutaneous TID WC  . ipratropium-albuterol  3 mL Nebulization QID  . mouth rinse  15 mL Mouth Rinse q12n4p  . methylPREDNISolone (SOLU-MEDROL) injection  60 mg Intravenous Q12H  . metoprolol tartrate  50 mg Oral BID  . pneumococcal 23 valent vaccine  0.5 mL Intramuscular Tomorrow-1000  . rivaroxaban  20 mg Oral Q supper  . senna-docusate  1 tablet Oral BID   Continuous: . sodium chloride 10 mL/hr at 08/16/18 0440   PZW:CHENID chloride, acetaminophen, albuterol, meclizine  Assesment: She was admitted with acute on chronic hypoxic and hypercapnic respiratory failure.  She had pneumonia which has cleared by chest x-ray.  She had significant deterioration on 6 3 and 6 4 but has improved since then.  Today she is on nasal cannula and says she feels well.  She has no new complaints.  She has atrial flutter and her heart is mildly irregular and it looks like she has atrial flutter on monitor  She has COPD at baseline which is pretty severe  Overall she has substantially improved.  Agree with plans by Dr. Manuella Ghazi Principal Problem:   Acute and chronic respiratory failure (acute-on-chronic) (HCC) Active Problems:   COPD (chronic obstructive pulmonary disease) (HCC)   Essential  hypertension   Atrial flutter (HCC)   Diabetes mellitus ([place])   Chronic respiratory failure with hypoxia (HCC)   Left lower lobe pneumonia ([place])   Leukocytosis    Plan: Move out of ICU.  Taper steroids.    LOS: 7 days   Eileen [name] 08/17/2018, 8:38 AM

## 2018-08-17 NOTE — Progress Notes (Signed)
Patient on 4 lpm /[place] did not wish BiPAP now awake watching tv

## 2018-08-17 NOTE — Progress Notes (Signed)
PROGRESS NOTE    Eileen [name]  YSA:[id number] DOB: June 06, 1945 DOA: 08/10/2018 PCP: Lavella Lemons, PA   Brief Narrative:   73 year old oxygen and steroid-dependent COPD presented with 3 days of progressive shortness of breath.She was more confused and short of breath on 6/2 prompting transfer to stepdown unit for closer monitoring.She continued to have some ongoing confusion on 6/3 and required BiPAP due to hypercapnia.  On the morning of 6/4 she is overall doing much better and has remained off BiPAP most of the evening and was transitioned to nasal cannula.  Pulmonology had seen patient given respiratory issues and had increased steroid dose which patient had responded well to.  She continued to do better on the morning of 6/5 and 6/6 and steroids were weaned.  She remained on her usual home 4 L nasal cannula.  Assessment & Plan:   Principal Problem:   Acute and chronic respiratory failure (acute-on-chronic) (HCC) Active Problems:   COPD (chronic obstructive pulmonary disease) (HCC)   Essential hypertension   Atrial flutter (HCC)   Diabetes mellitus ([place])   Chronic respiratory failure with hypoxia (HCC)   Left lower lobe pneumonia (HCC)   Leukocytosis   1. Acute on chronic respiratory failure-exacerbated bypneumonia. She is improving remarkably after steroid dose was increased with the assistance of pulmonology on 6/4.  Plan to start tapering down to 2 doses of steroids today and further wean tomorrow.  Transfer to [place] floor as patient has demonstrated great improvement.  Up in chair.  Discontinue cefepime as she has completed 7 doses. 2. Chronic respiratory failure/severesteroid and oxygen-dependentCOPD-she is on temporary IV steroids and resumed home bronchodilators.    Continue weaning. 3. Left basilar pneumonia-continue antibiotics. Completed 7-day course of cefepime and will discontinue today. 4. Abdominal distension - I got an acute abdominal series that is  showing large amount of stool in colon.She has had bowel movement on 6/2 after enema.  Continue to monitor for regular bowel movements. 5. Type 2 diabetes mellitus, insulin requiring with hyperglycemia-she is having some steroid induced hyperglycemia. Mealtime 3 times daily coverage increased to 10 units yesterday per diabetes coordinator recommendations.  Continue to monitor closely as steroids are being weaned. 6. Chronic atrial flutter-we have resumed home cardiac medications and full anticoagulation with Xarelto.Currently rate controlled.   DVT prophylaxis:Xarelto Code Status:Full Family Communication:We will update daughter on phone-[name] Disposition Plan:Transfer to floor today and wean IV steroids.  Anticipate discharge by 6/8 to home with home health as requested by patient and family members.   Consultants:  Pulmonology  Procedures:  None  Antimicrobials:  Cefepime 5/30-6/5 Vancomycin 5/30-5/31  Subjective: Patient seen and evaluated today with no new acute complaints or concerns. No acute concerns or events noted overnight.  She has remained on her usual home 4 L nasal cannula with no further BiPAP requirements.  No respiratory distress noted.  She would like to sit in a chair today.  Objective: Vitals:   08/16/18 2356 08/17/18 0000 08/17/18 0105 08/17/18 0441  BP:  116/81 110/68   Pulse:  73 69   Resp:  16 (!) 24   Temp: 98.1 F (36.7 C)   98 F (36.7 C)  TempSrc:      SpO2:  100% 99%   Weight:    76.9 kg  Height:        Intake/Output Summary (Last 24 hours) at 08/17/2018 0729 Last data filed at 08/16/2018 1700 Gross per 24 hour  Intake 360 ml  Output -  Net  360 ml   Filed Weights   08/15/18 0500 08/16/18 0500 08/17/18 0441  Weight: 78 kg 75.9 kg 76.9 kg    Examination:  General exam: Appears calm and comfortable  Respiratory system: Clear to auscultation. Respiratory effort normal.  Currently on 4 L nasal cannula. Cardiovascular  system: S1 & S2 heard, RRR. No JVD, murmurs, rubs, gallops or clicks. No pedal edema. Gastrointestinal system: Abdomen is nondistended, soft and nontender. No organomegaly or masses felt. Normal bowel sounds heard. Central nervous system: Alert and oriented. No focal neurological deficits. Extremities: Symmetric 5 x 5 power. Skin: No rashes, lesions or ulcers Psychiatry: Judgement and insight appear normal. Mood & affect appropriate.     Data Reviewed: I have personally reviewed following labs and imaging studies  CBC: Recent Labs  Lab 08/10/18 1156 08/11/18 0359 08/12/18 0436 08/13/18 0506 08/15/18 0439 08/16/18 0433  WBC 14.1* 11.8* 16.0* 13.8* 14.8* 13.0*  NEUTROABS 12.0* 10.8* 14.7* 12.8*  --   --   HGB 12.5 11.2* 11.1* 11.0* 12.0 12.0  HCT 41.6 38.6 38.2 37.7 40.3 39.7  MCV 98.6 102.1* 101.3* 100.3* 97.6 96.1  PLT 252 238 242 256 260 782   Basic Metabolic Panel: Recent Labs  Lab 08/11/18 0359 08/12/18 0436 08/13/18 0506 08/15/18 0439 08/16/18 0433  NA 143 139 141 141 140  K 4.8 5.1 5.2* 4.2 4.4  CL 99 98 97* 90* 89*  CO2 30 29 34* 38* 38*  GLUCOSE 279* 178* 243* 206* 216*  BUN 15 20 28* 21 20  CREATININE 0.71 0.58 0.64 0.56 0.57  CALCIUM 9.0 8.8* 8.9 9.7 9.8  MG 2.2 2.4 2.4 2.4  --    GFR: Estimated Creatinine Clearance: 60.1 mL/min (by C-G formula based on SCr of 0.57 mg/dL). Liver Function Tests: Recent Labs  Lab 08/11/18 0359 08/12/18 0436 08/13/18 0506  AST 18 20 29   ALT 22 26 37  ALKPHOS 46 46 51  BILITOT 0.6 0.5 0.5  PROT 6.4* 6.2* 6.1*  ALBUMIN 3.8 3.6 3.7   No results for input(s): LIPASE, AMYLASE in the last 168 hours. No results for input(s): AMMONIA in the last 168 hours. Coagulation Profile: No results for input(s): INR, PROTIME in the last 168 hours. Cardiac Enzymes: Recent Labs  Lab 08/10/18 1156  TROPONINI <0.03   BNP (last 3 results) No results for input(s): PROBNP in the last 8760 hours. HbA1C: No results for input(s):  HGBA1C in the last 72 hours. CBG: Recent Labs  Lab 08/16/18 0753 08/16/18 1120 08/16/18 1121 08/16/18 1628 08/16/18 2101  GLUCAP 229* 361* 379* 136* 287*   Lipid Profile: No results for input(s): CHOL, HDL, LDLCALC, TRIG, CHOLHDL, LDLDIRECT in the last 72 hours. Thyroid Function Tests: No results for input(s): TSH, T4TOTAL, FREET4, T3FREE, THYROIDAB in the last 72 hours. Anemia Panel: No results for input(s): VITAMINB12, FOLATE, FERRITIN, TIBC, IRON, RETICCTPCT in the last 72 hours. Sepsis Labs: No results for input(s): PROCALCITON, LATICACIDVEN in the last 168 hours.  Recent Results (from the past 240 hour(s))  SARS Coronavirus 2 (CEPHEID - Performed in [facility] hospital lab), Hosp Order     Status: None   Collection Time: 08/10/18 11:56 AM  Result Value Ref Range Status   SARS Coronavirus 2 NEGATIVE NEGATIVE Final    Comment: (NOTE) If result is NEGATIVE SARS-CoV-2 target nucleic acids are NOT DETECTED. The SARS-CoV-2 RNA is generally detectable in upper and lower  respiratory specimens during the acute phase of infection. The lowest  concentration of SARS-CoV-2 viral copies  this assay can detect is 250  copies / mL. A negative result does not preclude SARS-CoV-2 infection  and should not be used as the sole basis for treatment or other  patient management decisions.  A negative result may occur with  improper specimen collection / handling, submission of specimen other  than nasopharyngeal swab, presence of viral mutation(s) within the  areas targeted by this assay, and inadequate number of viral copies  (<250 copies / mL). A negative result must be combined with clinical  observations, patient history, and epidemiological information. If result is POSITIVE SARS-CoV-2 target nucleic acids are DETECTED. The SARS-CoV-2 RNA is generally detectable in upper and lower  respiratory specimens dur ing the acute phase of infection.  Positive  results are indicative of active  infection with SARS-CoV-2.  Clinical  correlation with patient history and other diagnostic information is  necessary to determine patient infection status.  Positive results do  not rule out bacterial infection or co-infection with other viruses. If result is PRESUMPTIVE POSTIVE SARS-CoV-2 nucleic acids MAY BE PRESENT.   A presumptive positive result was obtained on the submitted specimen  and confirmed on repeat testing.  While 2019 novel coronavirus  (SARS-CoV-2) nucleic acids may be present in the submitted sample  additional confirmatory testing may be necessary for epidemiological  and / or clinical management purposes  to differentiate between  SARS-CoV-2 and other Sarbecovirus currently known to infect humans.  If clinically indicated additional testing with an alternate test  methodology [phone]) is advised. The SARS-CoV-2 RNA is generally  detectable in upper and lower respiratory sp ecimens during the acute  phase of infection. The expected result is Negative. Fact Sheet for Patients:  StrictlyIdeas.no Fact Sheet for Healthcare Providers: BankingDealers.co.za This test is not yet approved or cleared by the [place] FDA and has been authorized for detection and/or diagnosis of SARS-CoV-2 by FDA under an Emergency Use Authorization (EUA).  This EUA will remain in effect (meaning this test can be used) for the duration of the COVID-19 declaration under Section 564(b)(1) of the Act, 21 U.S.C. section 360bbb-3(b)(1), unless the authorization is terminated or revoked sooner. Performed at [facility], [address], [place], [place] [address]   Culture, blood (routine x 2) Call MD if unable to obtain prior to antibiotics being given     Status: None   Collection Time: 08/10/18  3:19 PM  Result Value Ref Range Status   Specimen Description BLOOD LEFT ARM  Final   Special Requests   Final    BOTTLES DRAWN AEROBIC AND ANAEROBIC  Blood Culture adequate volume   Culture   Final    NO GROWTH 5 DAYS Performed at [facility], [address], [place], [place] [address]    Report Status 08/15/2018 FINAL  Final  Culture, blood (routine x 2) Call MD if unable to obtain prior to antibiotics being given     Status: None   Collection Time: 08/10/18  3:28 PM  Result Value Ref Range Status   Specimen Description BLOOD LEFT WRIST  Final   Special Requests   Final    BOTTLES DRAWN AEROBIC AND ANAEROBIC Blood Culture adequate volume   Culture   Final    NO GROWTH 5 DAYS Performed at [facility], [address], [place], [place] [address]    Report Status 08/15/2018 FINAL  Final  MRSA PCR Screening     Status: None   Collection Time: 08/10/18  4:19 PM  Result Value Ref Range  Status   MRSA by PCR NEGATIVE NEGATIVE Final    Comment:        The GeneXpert MRSA Assay (FDA approved for NASAL specimens only), is one component of a comprehensive MRSA colonization surveillance program. It is not intended to diagnose MRSA infection nor to guide or monitor treatment for MRSA infections. Performed at [facility], [address] [place], [place] [address]   Culture, Urine     Status: None   Collection Time: 08/13/18  5:25 PM  Result Value Ref Range Status   Specimen Description   Final    URINE, RANDOM Performed at [facility], [address], [place], [place] [address]    Special Requests   Final    NONE Performed at [facility], [address], [place], [place] [address]    Culture   Final    NO GROWTH Performed at [facility], [place] [address] [place],  [address]    Report Status 08/15/2018 FINAL  Final         Radiology Studies: Dg Chest Port 1 View  Result Date: 08/15/2018 CLINICAL DATA:  Shortness of breath. EXAM: PORTABLE CHEST 1 VIEW COMPARISON:  Radiographs of August 13, 2018. FINDINGS: Stable cardiomediastinal silhouette. No pneumothorax is noted. Right lung is clear. Minimal left basilar  atelectasis is noted with possible associated pleural effusion. Bony thorax is unremarkable. IMPRESSION: Minimal left basilar subsegmental atelectasis is noted with minimal left pleural effusion. Electronically Signed   By: Marijo Conception M.D.   On: 08/15/2018 09:48        Scheduled Meds: . amiodarone  200 mg Oral Daily  . chlorhexidine  15 mL Mouth Rinse BID  . Chlorhexidine Gluconate Cloth  6 each Topical Daily  . fluticasone  2 spray Each Nare Daily  . fluticasone furoate-vilanterol  1 puff Inhalation Daily  . furosemide  40 mg Intravenous Daily  . guaiFENesin  1,200 mg Oral BID  . insulin aspart  0-5 Units Subcutaneous QHS  . insulin aspart  0-9 Units Subcutaneous TID WC  . insulin aspart  10 Units Subcutaneous TID WC  . ipratropium-albuterol  3 mL Nebulization QID  . mouth rinse  15 mL Mouth Rinse q12n4p  . methylPREDNISolone (SOLU-MEDROL) injection  60 mg Intravenous Q12H  . metoprolol tartrate  50 mg Oral BID  . pneumococcal 23 valent vaccine  0.5 mL Intramuscular Tomorrow-1000  . rivaroxaban  20 mg Oral Q supper  . senna-docusate  1 tablet Oral BID   Continuous Infusions: . sodium chloride 10 mL/hr at 08/16/18 0440     LOS: 7 days    Time spent: 30 minutes    Khamya Topp Darleen Crocker, DO [facility] Pager [phone]  If 7PM-7AM, please contact night-coverage [email or web address] Password [email or web address] 08/17/2018, 7:29 AM

## 2018-08-18 LAB — CBC
HCT: 40.8 % (ref 36.0–46.0)
Hemoglobin: 11.8 g/dL — ABNORMAL LOW (ref 12.0–15.0)
MCH: 28.4 pg (ref 26.0–34.0)
MCHC: 28.9 g/dL — ABNORMAL LOW (ref 30.0–36.0)
MCV: 98.3 fL (ref 80.0–100.0)
Platelets: 315 K/uL (ref 150–400)
RBC: 4.15 MIL/uL (ref 3.87–5.11)
RDW: 13.2 % (ref 11.5–15.5)
WBC: 16.5 K/uL — ABNORMAL HIGH (ref 4.0–10.5)
nRBC: 0 % (ref 0.0–0.2)

## 2018-08-18 LAB — BASIC METABOLIC PANEL
Anion gap: 13 (ref 5–15)
BUN: 31 mg/dL — ABNORMAL HIGH (ref 8–23)
CO2: 40 mmol/L — ABNORMAL HIGH (ref 22–32)
Calcium: 10 mg/dL (ref 8.9–10.3)
Chloride: 88 mmol/L — ABNORMAL LOW (ref 98–111)
Creatinine, Ser: 0.68 mg/dL (ref 0.44–1.00)
GFR calc Af Amer: >60 mL/min (ref >60)
GFR calc non Af Amer: >60 mL/min (ref >60)
Glucose, Bld: 214 mg/dL — ABNORMAL HIGH (ref 70–99)
Potassium: 3.8 mmol/L (ref 3.5–5.1)
Sodium: 141 mmol/L (ref 135–145)

## 2018-08-18 LAB — GLUCOSE, CAPILLARY
Glucose-Capillary: 201 mg/dL — ABNORMAL HIGH (ref 70–99)
Glucose-Capillary: 300 mg/dL — ABNORMAL HIGH (ref 70–99)
Glucose-Capillary: 101 mg/dL — ABNORMAL HIGH (ref 70–99)
Glucose-Capillary: 190 mg/dL — ABNORMAL HIGH (ref 70–99)

## 2018-08-18 MED ORDER — IPRATROPIUM-ALBUTEROL 0.5-2.5 (3) MG/3ML IN SOLN
3.0000 mL | RESPIRATORY_TRACT | Status: DC | PRN
  Filled 2018-08-18: qty 3

## 2018-08-18 MED ORDER — METHYLPREDNISOLONE SODIUM SUCC 40 MG IJ SOLR
40.0000 mg | Freq: Two times a day (BID) | INTRAMUSCULAR | Status: DC
  Administered 2018-08-18 – 2018-08-19 (×2): 40 mg via INTRAVENOUS
  Filled 2018-08-18 (×2): qty 1

## 2018-08-18 NOTE — Progress Notes (Addendum)
Subjective: She says she feels better.  She has no new complaints.  She wants to know about what her oxygen level should be at home and I told her if she is 88-90 that is adequate.  Because she has elevated PCO2 I do not want her to be in the high 90s.  She can adjust her oxygen somewhat to get a level around 90  Objective: Vital signs in last 24 hours: Temp:  [98.1 F (36.7 C)-98.7 F (37.1 C)] 98.3 F (36.8 C) (06/07 0521) Pulse Rate:  [74-98] 77 (06/07 0521) Resp:  [16-22] 16 (06/07 0521) BP: (104-135)/(55-96) 135/96 (06/07 0521) SpO2:  [91 %-98 %] 95 % (06/07 0754) Weight change:  Last BM Date: 08/15/18  Intake/Output from previous day: 06/06 0701 - 06/07 0700 In: 240 [P.O.:240] Out: 800 [Urine:800]  PHYSICAL EXAM General appearance: alert, cooperative and no distress Resp: rhonchi bilaterally Cardio: regular rate and rhythm, S1, S2 normal, no murmur, click, rub or gallop GI: soft, non-tender; bowel sounds normal; no masses,  no organomegaly Extremities: extremities normal, atraumatic, no cyanosis or edema  Lab Results:  Results for orders placed or performed during the hospital encounter of 08/10/18 (from the past 48 hour(s))  Glucose, capillary     Status: Abnormal   Collection Time: 08/16/18 11:20 AM  Result Value Ref Range   Glucose-Capillary 361 (H) 70 - 99 mg/dL  Glucose, capillary     Status: Abnormal   Collection Time: 08/16/18 11:21 AM  Result Value Ref Range   Glucose-Capillary 379 (H) 70 - 99 mg/dL  Glucose, capillary     Status: Abnormal   Collection Time: 08/16/18  4:28 PM  Result Value Ref Range   Glucose-Capillary 136 (H) 70 - 99 mg/dL  Glucose, capillary     Status: Abnormal   Collection Time: 08/16/18  9:01 PM  Result Value Ref Range   Glucose-Capillary 287 (H) 70 - 99 mg/dL   Comment 1 Notify RN    Comment 2 Document in Chart   Glucose, capillary     Status: Abnormal   Collection Time: 08/17/18  7:57 AM  Result Value Ref Range   Glucose-Capillary 209 (H) 70 - 99 mg/dL  Glucose, capillary     Status: Abnormal   Collection Time: 08/17/18 11:38 AM  Result Value Ref Range   Glucose-Capillary 287 (H) 70 - 99 mg/dL  Glucose, capillary     Status: Abnormal   Collection Time: 08/17/18  4:06 PM  Result Value Ref Range   Glucose-Capillary 153 (H) 70 - 99 mg/dL  Glucose, capillary     Status: Abnormal   Collection Time: 08/17/18  9:36 PM  Result Value Ref Range   Glucose-Capillary 166 (H) 70 - 99 mg/dL  CBC     Status: Abnormal   Collection Time: 08/18/18  6:06 AM  Result Value Ref Range   WBC 16.5 (H) 4.0 - 10.5 K/uL   RBC 4.15 3.87 - 5.11 MIL/uL   Hemoglobin 11.8 (L) 12.0 - 15.0 g/dL   HCT 40.8 36.0 - 46.0 %   MCV 98.3 80.0 - 100.0 fL   MCH 28.4 26.0 - 34.0 pg   MCHC 28.9 (L) 30.0 - 36.0 g/dL   RDW 13.2 11.5 - 15.5 %   Platelets 315 150 - 400 K/uL   nRBC 0.0 0.0 - 0.2 %    Comment: Performed at [facility], [address], [place], [place] [address]  Basic metabolic panel     Status: Abnormal   Collection Time:  08/18/18  6:06 AM  Result Value Ref Range   Sodium 141 135 - 145 mmol/L   Potassium 3.8 3.5 - 5.1 mmol/L   Chloride 88 (L) 98 - 111 mmol/L   CO2 40 (H) 22 - 32 mmol/L   Glucose, Bld 214 (H) 70 - 99 mg/dL   BUN 31 (H) 8 - 23 mg/dL   Creatinine, Ser 0.68 0.44 - 1.00 mg/dL   Calcium 10.0 8.9 - 10.3 mg/dL   GFR calc non Af Amer >60 >60 mL/min   GFR calc Af Amer >60 >60 mL/min   Anion gap 13 5 - 15    Comment: Performed at [facility], [address], [place], [place] [address]  Glucose, capillary     Status: Abnormal   Collection Time: 08/18/18  7:17 AM  Result Value Ref Range   Glucose-Capillary 201 (H) 70 - 99 mg/dL    ABGS Recent Labs    08/16/18 0630  PHART 7.429  PO2ART 61.4*  HCO3 40.5*   CULTURES Recent Results (from the past 240 hour(s))  SARS Coronavirus 2 (CEPHEID - Performed in [place] hospital lab), Hosp Order     Status: None   Collection Time: 08/10/18 11:56 AM  Result  Value Ref Range Status   SARS Coronavirus 2 NEGATIVE NEGATIVE Final    Comment: (NOTE) If result is NEGATIVE SARS-CoV-2 target nucleic acids are NOT DETECTED. The SARS-CoV-2 RNA is generally detectable in upper and lower  respiratory specimens during the acute phase of infection. The lowest  concentration of SARS-CoV-2 viral copies this assay can detect is 250  copies / mL. A negative result does not preclude SARS-CoV-2 infection  and should not be used as the sole basis for treatment or other  patient management decisions.  A negative result may occur with  improper specimen collection / handling, submission of specimen other  than nasopharyngeal swab, presence of viral mutation(s) within the  areas targeted by this assay, and inadequate number of viral copies  (<250 copies / mL). A negative result must be combined with clinical  observations, patient history, and epidemiological information. If result is POSITIVE SARS-CoV-2 target nucleic acids are DETECTED. The SARS-CoV-2 RNA is generally detectable in upper and lower  respiratory specimens dur ing the acute phase of infection.  Positive  results are indicative of active infection with SARS-CoV-2.  Clinical  correlation with patient history and other diagnostic information is  necessary to determine patient infection status.  Positive results do  not rule out bacterial infection or co-infection with other viruses. If result is PRESUMPTIVE POSTIVE SARS-CoV-2 nucleic acids MAY BE PRESENT.   A presumptive positive result was obtained on the submitted specimen  and confirmed on repeat testing.  While 2019 novel coronavirus  (SARS-CoV-2) nucleic acids may be present in the submitted sample  additional confirmatory testing may be necessary for epidemiological  and / or clinical management purposes  to differentiate between  SARS-CoV-2 and other Sarbecovirus currently known to infect humans.  If clinically indicated additional testing  with an alternate test  methodology [phone]) is advised. The SARS-CoV-2 RNA is generally  detectable in upper and lower respiratory sp ecimens during the acute  phase of infection. The expected result is Negative. Fact Sheet for Patients:  StrictlyIdeas.no Fact Sheet for Healthcare Providers: BankingDealers.co.za This test is not yet approved or cleared by the [place] FDA and has been authorized for detection and/or diagnosis of SARS-CoV-2 by FDA under an Emergency Use Authorization (EUA).  This EUA  will remain in effect (meaning this test can be used) for the duration of the COVID-19 declaration under Section 564(b)(1) of the Act, 21 U.S.C. section 360bbb-3(b)(1), unless the authorization is terminated or revoked sooner. Performed at [facility], [address], [place], [place] [address]   Culture, blood (routine x 2) Call MD if unable to obtain prior to antibiotics being given     Status: None   Collection Time: 08/10/18  3:19 PM  Result Value Ref Range Status   Specimen Description BLOOD LEFT ARM  Final   Special Requests   Final    BOTTLES DRAWN AEROBIC AND ANAEROBIC Blood Culture adequate volume   Culture   Final    NO GROWTH 5 DAYS Performed at [facility], [address], [place], [place] [address]    Report Status 08/15/2018 FINAL  Final  Culture, blood (routine x 2) Call MD if unable to obtain prior to antibiotics being given     Status: None   Collection Time: 08/10/18  3:28 PM  Result Value Ref Range Status   Specimen Description BLOOD LEFT WRIST  Final   Special Requests   Final    BOTTLES DRAWN AEROBIC AND ANAEROBIC Blood Culture adequate volume   Culture   Final    NO GROWTH 5 DAYS Performed at [facility], [address] [place], [place] [address]    Report Status 08/15/2018 FINAL  Final  MRSA PCR Screening     Status: None   Collection Time: 08/10/18  4:19 PM  Result Value Ref Range Status   MRSA by PCR  NEGATIVE NEGATIVE Final    Comment:        The GeneXpert MRSA Assay (FDA approved for NASAL specimens only), is one component of a comprehensive MRSA colonization surveillance program. It is not intended to diagnose MRSA infection nor to guide or monitor treatment for MRSA infections. Performed at [facility], [address] [place], [place] [address]   Culture, Urine     Status: None   Collection Time: 08/13/18  5:25 PM  Result Value Ref Range Status   Specimen Description   Final    URINE, RANDOM Performed at [facility], [address], [place], [place] [address]    Special Requests   Final    NONE Performed at [facility], [address] [place], [place] [address]    Culture   Final    NO GROWTH Performed at [facility], [place] [address], [place], [place] [address]    Report Status 08/15/2018 FINAL  Final   Studies/Results: No results found.  Medications:  Prior to Admission:  Medications Prior to Admission  Medication Sig Dispense Refill Last Dose  . acetaminophen (TYLENOL) 500 MG tablet Take 500 mg by mouth every 8 (eight) hours as needed for mild pain or headache.    Taking  . albuterol (PROVENTIL) (2.5 MG/3ML) 0.083% nebulizer solution Take 2.5 mg by nebulization every 6 (six) hours as needed for wheezing or shortness of breath.   Taking  . amiodarone (PACERONE) 200 MG tablet Take 1 tablet (200 mg total) by mouth daily. 90 tablet 3 08/09/2018 at Unknown time  . blood glucose meter kit and supplies KIT Dispense based on patient and insurance preference. Use up to four times daily as directed. (FOR ICD-9 250.00, 250.01).  Check sugar before meals and at night, keep a log. 1 each 0 Taking  . furosemide (LASIX) 40 MG tablet 20 mg daily.    Taking  .  guaiFENesin (MUCINEX) 600 MG 12 hr tablet Take 1 tablet (600 mg total) by mouth 2 (two) times daily. 30 tablet 0 Taking  . meclizine (ANTIVERT) 12.5 MG tablet Take 1 tablet by mouth 3 (three) times daily as needed.    Taking  . metFORMIN (GLUCOPHAGE-XR) 500 MG 24 hr tablet Take 500 mg by mouth daily.  1 08/09/2018 at Unknown time  . metoprolol tartrate (LOPRESSOR) 50 MG tablet Take 50 mg by mouth 2 (two) times daily.   08/10/2018 at 830  . Multiple Vitamins-Minerals (EMERGEN-C IMMUNE) PACK Take 1 Package by mouth daily.   08/09/2018 at Unknown time  . OXYGEN Inhale 2.5-3 L into the lungs daily. At night and daily as needed   Taking  . predniSONE (DELTASONE) 10 MG tablet Take 10 mg by mouth daily.   08/10/2018 at Unknown time  . PROAIR HFA 108 (90 BASE) MCG/ACT inhaler Inhale 2 puffs into the lungs every 6 (six) hours as needed for wheezing or shortness of breath.    Taking  . psyllium (HYDROCIL/METAMUCIL) 95 % PACK Take 1 packet by mouth daily. (Patient taking differently: Take 1 packet by mouth daily as needed. ) 240 each 0 Taking  . rivaroxaban (XARELTO) 20 MG TABS tablet Take 1 tablet (20 mg total) by mouth daily with supper. 21 tablet 0 08/10/2018 at 830  . TRELEGY ELLIPTA 100-62.5-25 MCG/INH AEPB Take 1 puff by mouth daily.  0 08/10/2018 at Unknown time  . Vitamin D, Ergocalciferol, (DRISDOL) 50000 units CAPS capsule Take 50,000 Units every 30 (thirty) days by mouth.   Past Month at Unknown time   Scheduled: . amiodarone  200 mg Oral Daily  . chlorhexidine  15 mL Mouth Rinse BID  . Chlorhexidine Gluconate Cloth  6 each Topical Daily  . fluticasone  2 spray Each Nare Daily  . fluticasone furoate-vilanterol  1 puff Inhalation Daily  . furosemide  40 mg Intravenous Daily  . guaiFENesin  1,200 mg Oral BID  . insulin aspart  0-5 Units Subcutaneous QHS  . insulin aspart  0-9 Units Subcutaneous TID WC  . insulin aspart  10 Units Subcutaneous TID WC  . ipratropium-albuterol  3 mL Nebulization QID  . mouth rinse  15 mL Mouth Rinse q12n4p  . methylPREDNISolone (SOLU-MEDROL) injection  40 mg Intravenous Q12H  . metoprolol tartrate  50 mg Oral BID  . pneumococcal 23 valent vaccine  0.5 mL Intramuscular Tomorrow-1000   . rivaroxaban  20 mg Oral Q supper  . senna-docusate  1 tablet Oral BID   Continuous: . sodium chloride 10 mL/hr at 08/16/18 0440   KVQ:[id number] chloride, acetaminophen, albuterol, ibuprofen, meclizine  Assesment: She has acute on chronic hypoxic and hypercapnic respiratory failure from left lower lobe pneumonia and COPD exacerbation.  She is improving.  Steroids are being tapered.  She remains on bronchodilators.  She remains on oxygen.  She has chronic atrial flutter but her heart seems regular this morning  She has diabetes and she is on sliding scale. Principal Problem:   Acute and chronic respiratory failure (acute-on-chronic) (HCC) Active Problems:   COPD (chronic obstructive pulmonary disease) (HCC)   Essential hypertension   Atrial flutter (HCC)   Diabetes mellitus ([place])   Chronic respiratory failure with hypoxia (HCC)   Left lower lobe pneumonia (HCC)   Leukocytosis    Plan: Continue treatments.  She is continuing to taper steroids.  She can adjust her oxygen at home to make sure she stays around 90% saturation.  She says she  thinks she is going to go home tomorrow and I think that is appropriate provided she continues to improve.  I will follow more peripherally now.  I will plan to see her in the office    LOS: 8 days   Eileen [name] 08/18/2018, 8:41 AM

## 2018-08-18 NOTE — Progress Notes (Signed)
PROGRESS NOTE    Eileen [name]  XBD:[id number] DOB: 01-Aug-1945 DOA: 08/10/2018 PCP: Lavella Lemons, PA   Brief Narrative:   73 year old oxygen and steroid-dependent COPD presented with 3 days of progressive shortness of breath.She was more confused and short of breath on 6/2 prompting transfer to stepdown unit for closer monitoring.She continuedto have some ongoing confusion on 6/3 and required BiPAP due to hypercapnia.On the morning of 6/4 she is overall doing much better and has remained off BiPAP most of the evening and was transitioned to nasal cannula. Pulmonology had seen patient given respiratory issues and had increased steroid dose which patient had responded well to.  She continued to do better on the morning of 6/5 and 6/6 and steroids were weaned.  She remained on her usual home 4 L nasal cannula.  She continue to wean her steroids through 6/7 and appears to be improving overall.  Assessment & Plan:   Principal Problem:   Acute and chronic respiratory failure (acute-on-chronic) (HCC) Active Problems:   COPD (chronic obstructive pulmonary disease) (HCC)   Essential hypertension   Atrial flutter (HCC)   Diabetes mellitus ([place])   Chronic respiratory failure with hypoxia (HCC)   Left lower lobe pneumonia (HCC)   Leukocytosis  1. Acute on chronic respiratory failure-exacerbated bypneumonia. She is improving remarkably after steroid dose was increased with the assistance of pulmonology on 6/4.  Continue taper of methylprednisolone to 40 mg IV twice daily today and anticipate discharge on oral prednisone in a.m. 2. Chronic respiratory failure/severesteroid and oxygen-dependentCOPD-she is on temporary IV steroids and resumed home bronchodilators.  Continue weaning. 3. Left basilar pneumonia-continue antibiotics. Completed 7-day course of cefepime on 6/6. 4. Abdominal distension - I got an acute abdominal series that is showing large amount of stool in  colon.She has had bowel movement on 6/2 after enema.  Continue to monitor for regular bowel movements. 5. Type 2 diabetes mellitus, insulin requiring with hyperglycemia-she is having some steroid induced hyperglycemia. Mealtime 3 times daily coverage increased to 10 units yesterday per diabetes coordinator recommendations.  Continue to monitor closely as steroids are being weaned. 6. Chronic atrial flutter-we have resumed home cardiac medications and full anticoagulation with Xarelto.Currently rate controlled.   DVT prophylaxis:Xarelto Code Status:Full Family Communication:We will update daughter on phone-[name] Disposition Plan:Anticipate discharge in a.m. and wean IV steroids today.  Duo nebs to as needed.  DC IV Lasix.   Consultants:  Pulmonology  Procedures:  None  Antimicrobials:  Cefepime 5/30-6/5 Vancomycin 5/30-5/31   Subjective: Patient seen and evaluated today with no new acute complaints or concerns. No acute concerns or events noted overnight.  She was worried about her borderline pulse oximetry values in the high 80th percentile at home, but this was discussed with pulmonology Dr. Luan Pulling this morning.  Objective: Vitals:   08/17/18 1952 08/17/18 2134 08/18/18 0521 08/18/18 0754  BP:  113/65 (!) 135/96   Pulse: 86 86 77   Resp:  16 16   Temp:  98.7 F (37.1 C) 98.3 F (36.8 C)   TempSrc:  Oral Oral   SpO2: 91% 96% 94% 95%  Weight:      Height:        Intake/Output Summary (Last 24 hours) at 08/18/2018 0929 Last data filed at 08/17/2018 2100 Gross per 24 hour  Intake 240 ml  Output 800 ml  Net -560 ml   Filed Weights   08/15/18 0500 08/16/18 0500 08/17/18 0441  Weight: 78 kg 75.9 kg 76.9 kg  Examination:  General exam: Appears calm and comfortable  Respiratory system: Clear to auscultation. Respiratory effort normal.  Currently on 4 L nasal cannula. Cardiovascular system: S1 & S2 heard, RRR. No JVD, murmurs, rubs, gallops or  clicks. No pedal edema. Gastrointestinal system: Abdomen is nondistended, soft and nontender. No organomegaly or masses felt. Normal bowel sounds heard. Central nervous system: Alert and oriented. No focal neurological deficits. Extremities: Symmetric 5 x 5 power. Skin: No rashes, lesions or ulcers Psychiatry: Judgement and insight appear normal. Mood & affect appropriate.     Data Reviewed: I have personally reviewed following labs and imaging studies  CBC: Recent Labs  Lab 08/12/18 0436 08/13/18 0506 08/15/18 0439 08/16/18 0433 08/18/18 0606  WBC 16.0* 13.8* 14.8* 13.0* 16.5*  NEUTROABS 14.7* 12.8*  --   --   --   HGB 11.1* 11.0* 12.0 12.0 11.8*  HCT 38.2 37.7 40.3 39.7 40.8  MCV 101.3* 100.3* 97.6 96.1 98.3  PLT 242 256 260 249 297   Basic Metabolic Panel: Recent Labs  Lab 08/12/18 0436 08/13/18 0506 08/15/18 0439 08/16/18 0433 08/18/18 0606  NA 139 141 141 140 141  K 5.1 5.2* 4.2 4.4 3.8  CL 98 97* 90* 89* 88*  CO2 29 34* 38* 38* 40*  GLUCOSE 178* 243* 206* 216* 214*  BUN 20 28* 21 20 31*  CREATININE 0.58 0.64 0.56 0.57 0.68  CALCIUM 8.8* 8.9 9.7 9.8 10.0  MG 2.4 2.4 2.4  --   --    GFR: Estimated Creatinine Clearance: 60.1 mL/min (by C-G formula based on SCr of 0.68 mg/dL). Liver Function Tests: Recent Labs  Lab 08/12/18 0436 08/13/18 0506  AST 20 29  ALT 26 37  ALKPHOS 46 51  BILITOT 0.5 0.5  PROT 6.2* 6.1*  ALBUMIN 3.6 3.7   No results for input(s): LIPASE, AMYLASE in the last 168 hours. No results for input(s): AMMONIA in the last 168 hours. Coagulation Profile: No results for input(s): INR, PROTIME in the last 168 hours. Cardiac Enzymes: No results for input(s): CKTOTAL, CKMB, CKMBINDEX, TROPONINI in the last 168 hours. BNP (last 3 results) No results for input(s): PROBNP in the last 8760 hours. HbA1C: No results for input(s): HGBA1C in the last 72 hours. CBG: Recent Labs  Lab 08/17/18 0757 08/17/18 1138 08/17/18 1606 08/17/18 2136  08/18/18 0717  GLUCAP 209* 287* 153* 166* 201*   Lipid Profile: No results for input(s): CHOL, HDL, LDLCALC, TRIG, CHOLHDL, LDLDIRECT in the last 72 hours. Thyroid Function Tests: No results for input(s): TSH, T4TOTAL, FREET4, T3FREE, THYROIDAB in the last 72 hours. Anemia Panel: No results for input(s): VITAMINB12, FOLATE, FERRITIN, TIBC, IRON, RETICCTPCT in the last 72 hours. Sepsis Labs: No results for input(s): PROCALCITON, LATICACIDVEN in the last 168 hours.  Recent Results (from the past 240 hour(s))  SARS Coronavirus 2 (CEPHEID - Performed in [place] hospital lab), Hosp Order     Status: None   Collection Time: 08/10/18 11:56 AM  Result Value Ref Range Status   SARS Coronavirus 2 NEGATIVE NEGATIVE Final    Comment: (NOTE) If result is NEGATIVE SARS-CoV-2 target nucleic acids are NOT DETECTED. The SARS-CoV-2 RNA is generally detectable in upper and lower  respiratory specimens during the acute phase of infection. The lowest  concentration of SARS-CoV-2 viral copies this assay can detect is 250  copies / mL. A negative result does not preclude SARS-CoV-2 infection  and should not be used as the sole basis for treatment or other  patient management  decisions.  A negative result may occur with  improper specimen collection / handling, submission of specimen other  than nasopharyngeal swab, presence of viral mutation(s) within the  areas targeted by this assay, and inadequate number of viral copies  (<250 copies / mL). A negative result must be combined with clinical  observations, patient history, and epidemiological information. If result is POSITIVE SARS-CoV-2 target nucleic acids are DETECTED. The SARS-CoV-2 RNA is generally detectable in upper and lower  respiratory specimens dur ing the acute phase of infection.  Positive  results are indicative of active infection with SARS-CoV-2.  Clinical  correlation with patient history and other diagnostic information is   necessary to determine patient infection status.  Positive results do  not rule out bacterial infection or co-infection with other viruses. If result is PRESUMPTIVE POSTIVE SARS-CoV-2 nucleic acids MAY BE PRESENT.   A presumptive positive result was obtained on the submitted specimen  and confirmed on repeat testing.  While 2019 novel coronavirus  (SARS-CoV-2) nucleic acids may be present in the submitted sample  additional confirmatory testing may be necessary for epidemiological  and / or clinical management purposes  to differentiate between  SARS-CoV-2 and other Sarbecovirus currently known to infect humans.  If clinically indicated additional testing with an alternate test  methodology [phone]) is advised. The SARS-CoV-2 RNA is generally  detectable in upper and lower respiratory sp ecimens during the acute  phase of infection. The expected result is Negative. Fact Sheet for Patients:  StrictlyIdeas.no Fact Sheet for Healthcare Providers: BankingDealers.co.za This test is not yet approved or cleared by the [place] FDA and has been authorized for detection and/or diagnosis of SARS-CoV-2 by FDA under an Emergency Use Authorization (EUA).  This EUA will remain in effect (meaning this test can be used) for the duration of the COVID-19 declaration under Section 564(b)(1) of the Act, 21 U.S.C. section 360bbb-3(b)(1), unless the authorization is terminated or revoked sooner. Performed at [facility], [address] [place], [place] [address]   Culture, blood (routine x 2) Call MD if unable to obtain prior to antibiotics being given     Status: None   Collection Time: 08/10/18  3:19 PM  Result Value Ref Range Status   Specimen Description BLOOD LEFT ARM  Final   Special Requests   Final    BOTTLES DRAWN AEROBIC AND ANAEROBIC Blood Culture adequate volume   Culture   Final    NO GROWTH 5 DAYS Performed at [facility], [address] [place], [place] [address]    Report Status 08/15/2018 FINAL  Final  Culture, blood (routine x 2) Call MD if unable to obtain prior to antibiotics being given     Status: None   Collection Time: 08/10/18  3:28 PM  Result Value Ref Range Status   Specimen Description BLOOD LEFT WRIST  Final   Special Requests   Final    BOTTLES DRAWN AEROBIC AND ANAEROBIC Blood Culture adequate volume   Culture   Final    NO GROWTH 5 DAYS Performed at [facility], [address], [place],  [address]    Report Status 08/15/2018 FINAL  Final  MRSA PCR Screening     Status: None   Collection Time: 08/10/18  4:19 PM  Result Value Ref Range Status   MRSA by PCR NEGATIVE NEGATIVE Final    Comment:        The GeneXpert MRSA Assay (FDA approved for NASAL specimens only), is one component of a  comprehensive MRSA colonization surveillance program. It is not intended to diagnose MRSA infection nor to guide or monitor treatment for MRSA infections. Performed at [facility], [address], [place], [place] [address]   Culture, Urine     Status: None   Collection Time: 08/13/18  5:25 PM  Result Value Ref Range Status   Specimen Description   Final    URINE, RANDOM Performed at [facility], [address], [place], [place] [address]    Special Requests   Final    NONE Performed at [facility], [address] [place], [place] [address]    Culture   Final    NO GROWTH Performed at [facility], [place] [address] [place], [place] [address]    Report Status 08/15/2018 FINAL  Final         Radiology Studies: No results found.      Scheduled Meds: . amiodarone  200 mg Oral Daily  . chlorhexidine  15 mL Mouth Rinse BID  . Chlorhexidine Gluconate Cloth  6 each Topical Daily  . fluticasone  2 spray Each Nare Daily  . fluticasone furoate-vilanterol  1 puff Inhalation Daily  . guaiFENesin  1,200 mg Oral BID  . insulin aspart  0-5 Units Subcutaneous QHS  . insulin aspart  0-9  Units Subcutaneous TID WC  . insulin aspart  10 Units Subcutaneous TID WC  . mouth rinse  15 mL Mouth Rinse q12n4p  . methylPREDNISolone (SOLU-MEDROL) injection  40 mg Intravenous Q12H  . metoprolol tartrate  50 mg Oral BID  . pneumococcal 23 valent vaccine  0.5 mL Intramuscular Tomorrow-1000  . rivaroxaban  20 mg Oral Q supper  . senna-docusate  1 tablet Oral BID   Continuous Infusions: . sodium chloride 10 mL/hr at 08/16/18 0440     LOS: 8 days    Time spent: 30 minutes    Rubi Tooley Darleen Crocker, DO [facility] Pager [phone]  If 7PM-7AM, please contact night-coverage [email or web address] Password [facility] 08/18/2018, 9:29 AM

## 2018-08-19 ENCOUNTER — Ambulatory Visit: Payer: Self-pay | Admitting: *Deleted

## 2018-08-19 LAB — GLUCOSE, CAPILLARY
Glucose-Capillary: 138 mg/dL — ABNORMAL HIGH (ref 70–99)
Glucose-Capillary: 209 mg/dL — ABNORMAL HIGH (ref 70–99)

## 2018-08-19 MED ORDER — PREDNISONE 20 MG PO TABS: 40.0000 mg | ORAL_TABLET | Freq: Every day | ORAL | 0 refills | Status: DC

## 2018-08-19 NOTE — Plan of Care (Signed)
  Problem: Education: Goal: Knowledge of General Education information will improve Description Including pain rating scale, medication(s)/side effects and non-pharmacologic comfort measures Outcome: Adequate for Discharge   Problem: Health Behavior/Discharge Planning: Goal: Ability to manage health-related needs will improve Outcome: Adequate for Discharge   Problem: Activity: Goal: Risk for activity intolerance will decrease Outcome: Adequate for Discharge   Problem: Safety: Goal: Ability to remain free from injury will improve Outcome: Adequate for Discharge   Problem: Education: Goal: Knowledge of disease or condition will improve Outcome: Completed/Met   Problem: Activity: Goal: Ability to tolerate increased activity will improve Outcome: Completed/Met   Problem: Respiratory: Goal: Ability to maintain a clear airway will improve Outcome: Completed/Met

## 2018-08-19 NOTE — Progress Notes (Signed)
Physical Therapy Treatment Patient Details Name: Eileen [name] MRN: [id number] DOB: Apr 01, 1945 Today's Date: 08/19/2018    History of Present Illness Eileen A Porada is a 73 y.o. female with steroid-dependent and chronic oxygen requiring COPD reports that for the past 3 days she has been having increasing shortness of breath.  She had increased her home oxygen and nebulizer treatments for the past couple of days and initially had some relief but continues to have increasing shortness of breath prompting her to come to the emergency department.  She denies chest pain.  She denies fever and chills.  She reports that she has had a mostly nonproductive cough.  She has been wheezing more than normal.  She denies any known sick contacts.  No known exposure to COVID.    PT Comments    Patient demonstrates much improvement for bed mobility, sit to stands and transfers requiring less assistance, endurance/distance for taking steps using RW, but limited mostly due to fatigue.  Patient able to take a few steps without AD, but has tendency to lean on nearby objects for support and is safer using RW, on 3 LPM with SpO2 between 97-99% and tolerated sitting up at bedside to eat lunch after therapy.  Patient will benefit from continued physical therapy in hospital and recommended venue below to increase strength, balance, endurance for safe ADLs and gait.    Follow Up Recommendations  SNF;Supervision/Assistance - 24 hour;Supervision for mobility/OOB     Equipment Recommendations  Rolling walker with 5" wheels    Recommendations for Other Services       Precautions / Restrictions Precautions Precautions: Fall Restrictions Weight Bearing Restrictions: No    Mobility  Bed Mobility Overal bed mobility: Modified Independent       Supine to sit: Modified independent (Device/Increase time) Sit to supine: Modified independent (Device/Increase time)   General bed mobility comments: increased  time  Transfers Overall transfer level: Needs assistance Equipment used: Rolling walker (2 wheeled);None Transfers: Sit to/from [employer] to Stand: Supervision;Min guard Stand pivot transfers: Supervision;Min guard       General transfer comment: unsteady labored movement without AD, required use of RW for safety  Ambulation/Gait Ambulation/Gait assistance: Min guard;Supervision Gait Distance (Feet): 25 Feet Assistive device: Rolling walker (2 wheeled) Gait Pattern/deviations: Decreased step length - right;Decreased step length - left;Decreased stride length Gait velocity: decreased   General Gait Details: slow labored cadence without loss of balance, on 3 LPM with SpO2 at 97-99%, limited mostly due to c/o fatigue   Stairs             Wheelchair Mobility    Modified Rankin (Stroke Patients Only)       Balance Overall balance assessment: Needs assistance Sitting-balance support: Feet supported;No upper extremity supported Sitting balance-Leahy Scale: Good Sitting balance - Comments: seated at bedside   Standing balance support: During functional activity;No upper extremity supported Standing balance-Leahy Scale: Poor Standing balance comment: fair/poor without AD, fair using RW                            Cognition Arousal/Alertness: Awake/alert Behavior During Therapy: WFL for tasks assessed/performed Overall Cognitive Status: Within Functional Limits for tasks assessed                                        Exercises  General Comments        Pertinent Vitals/Pain Pain Assessment: No/denies pain    Home Living                      Prior Function            PT Goals (current goals can now be found in the care plan section) Acute Rehab PT Goals Patient Stated Goal: return home PT Goal Formulation: With patient Time For Goal Achievement: 08/28/18 Potential to Achieve Goals:  Good Progress towards PT goals: Progressing toward goals    Frequency    Min 3X/week      PT Plan Current plan remains appropriate    Co-evaluation              AM-PAC PT "6 Clicks" Mobility   Outcome Measure  Help needed turning from your back to your side while in a flat bed without using bedrails?: None Help needed moving from lying on your back to sitting on the side of a flat bed without using bedrails?: None Help needed moving to and from a bed to a chair (including a wheelchair)?: A Little Help needed standing up from a chair using your arms (e.g., wheelchair or bedside chair)?: None Help needed to walk in hospital room?: A Little Help needed climbing 3-5 steps with a railing? : A Lot 6 Click Score: 20    End of Session   Activity Tolerance: Patient tolerated treatment well;Patient limited by fatigue Patient left: in bed;with call bell/phone within reach(seated at bedside) Nurse Communication: Mobility status PT Visit Diagnosis: Unsteadiness on feet (R26.81);Other abnormalities of gait and mobility (R26.89);Muscle weakness (generalized) (M62.81)     Time: 1856-3149 PT Time Calculation (min) (ACUTE ONLY): 17 min  Charges:  $Therapeutic Activity: 8-22 mins                     12:31 PM, 08/19/18 Lonell Grandchild, MPT Physical Therapist with [facility] [phone] office [phone] mobile phone

## 2018-08-19 NOTE — TOC Transition Note (Signed)
Transition of Care [facility]) - CM/SW Discharge Note   Patient Details  Name: Eileen [name] MRN: [id number] Date of Birth: [birth year]  Transition of Care [facility]) CM/SW Contact:  Shade Flood, LCSW Phone Number: 08/19/2018, 9:05 AM   Clinical Narrative:     Pt with orders for dc today. Pt will be returning home with [facility]. Pt and family request [facility]. Referral given to [facility] with [facility]. There are no other TOC needs for dc.   Final next level of care: Home w Home Health Services Barriers to Discharge: Barriers Resolved   Patient Goals and CMS Choice   CMS Medicare.gov Compare Post Acute Care list provided to:: Patient Represenative (must comment) Choice offered to / list presented to : Adult Children  Discharge Placement                       Discharge Plan and Services In-house Referral: Clinical Social Work   Post Acute Care Choice: Home Health                    HH Arranged: RN, PT, Nurse's Aide [place] Agency: [place] ([facility]) Date [place]: 08/19/18 Time [place]: 0900 Representative spoke with at [place]: Leda Quail  Social Determinants of Health ([place]) Interventions     Readmission Risk Interventions Readmission Risk Prevention Plan 08/12/2018 06/28/2018  Transportation Screening Complete Complete  Home Care Screening Complete Complete  Some recent data might be hidden

## 2018-08-19 NOTE — Progress Notes (Signed)
Nsg Discharge Note  Admit Date:  08/10/2018 Discharge date: 08/19/2018   [name] [place] to be D/C'd per MD order.  AVS completed.  Copy for chart, and copy for patient signed, and dated. Patient/caregiver able to verbalize understanding.  Discharge Medication: Allergies as of 08/19/2018      Reactions   Codeine Other (See Comments)   "jittery"      Medication List    TAKE these medications   acetaminophen 500 MG tablet Commonly known as:  TYLENOL Take 500 mg by mouth every 8 (eight) hours as needed for mild pain or headache.   amiodarone 200 MG tablet Commonly known as:  PACERONE Take 1 tablet (200 mg total) by mouth daily.   blood glucose meter kit and supplies Kit Dispense based on patient and insurance preference. Use up to four times daily as directed. (FOR ICD-9 250.00, 250.01).  Check sugar before meals and at night, keep a log.   Emergen-C Immune Pack Take 1 Package by mouth daily.   furosemide 40 MG tablet Commonly known as:  LASIX 20 mg daily.   guaiFENesin 600 MG 12 hr tablet Commonly known as:  MUCINEX Take 1 tablet (600 mg total) by mouth 2 (two) times daily.   meclizine 12.5 MG tablet Commonly known as:  ANTIVERT Take 1 tablet by mouth 3 (three) times daily as needed.   metFORMIN 500 MG 24 hr tablet Commonly known as:  GLUCOPHAGE-XR Take 500 mg by mouth daily.   metoprolol tartrate 50 MG tablet Commonly known as:  LOPRESSOR Take 50 mg by mouth 2 (two) times daily.   OXYGEN Inhale 2.5-3 L into the lungs daily. At night and daily as needed   predniSONE 20 MG tablet Commonly known as:  Deltasone Take 2 tablets (40 mg total) by mouth daily for 5 days. What changed:    medication strength  how much to take   albuterol (2.5 MG/3ML) 0.083% nebulizer solution Commonly known as:  PROVENTIL Take 2.5 mg by nebulization every 6 (six) hours as needed for wheezing or shortness of breath.   ProAir HFA 108 (90 Base) MCG/ACT inhaler Generic drug:   albuterol Inhale 2 puffs into the lungs every 6 (six) hours as needed for wheezing or shortness of breath.   psyllium 95 % Pack Commonly known as:  HYDROCIL/METAMUCIL Take 1 packet by mouth daily. What changed:    when to take this  reasons to take this   rivaroxaban 20 MG Tabs tablet Commonly known as:  Xarelto Take 1 tablet (20 mg total) by mouth daily with supper.   Trelegy Ellipta 100-62.5-25 MCG/INH Aepb Generic drug:  Fluticasone-Umeclidin-Vilant Take 1 puff by mouth daily.   Vitamin D (Ergocalciferol) 1.25 MG (50000 UT) Caps capsule Commonly known as:  DRISDOL Take 50,000 Units every 30 (thirty) days by mouth.       Discharge Assessment: Vitals:   08/18/18 2105 08/19/18 0512  BP: 119/69 (!) 111/54  Pulse: 80 73  Resp: 19 20  Temp: 98.4 F (36.9 C) 98.1 F (36.7 C)  SpO2: 97% 96%   Skin clean, dry and intact without evidence of skin break down, no evidence of skin tears noted. IV catheter discontinued intact. Site without signs and symptoms of complications - no redness or edema noted at insertion site, patient denies c/o pain - only slight tenderness at site.  Dressing with slight pressure applied.  D/c Instructions-Education: Discharge instructions given to patient/family with verbalized understanding. D/c education completed with patient/family including follow up instructions,  medication list, d/c activities limitations if indicated, with other d/c instructions as indicated by MD - patient able to verbalize understanding, all questions fully answered. Patient instructed to return to ED, call 911, or call MD for any changes in condition.  Patient escorted via [place], and D/C home via private auto.  Venita Sheffield, RN 08/19/2018 11:56 AM

## 2018-08-19 NOTE — Care Management Important Message (Signed)
Important Message  Patient Details  Name: Eileen [name] MRN: [id number] Date of Birth: 1945-10-27   Medicare Important Message Given:  Yes    [name] 08/19/2018, 12:34 PM

## 2018-08-19 NOTE — Progress Notes (Signed)
Discussed with Dr. Manuella Ghazi.  Plan is for discharge today.  I will sign off.  I will plan to follow her up in the office  Thanks for allowing me to see her with you

## 2018-08-19 NOTE — Discharge Summary (Signed)
Physician Discharge Summary  Eileen [name] DPO:[id number] DOB: Sep 02, 1945 DOA: 08/10/2018  PCP: Lavella Lemons, PA  Admit date: 08/10/2018  Discharge date: 08/19/2018  Admitted From:Home  Disposition:  Home  Recommendations for Outpatient Follow-up:  1. Follow up with PCP in 1-2 weeks 2. Follow-up with Dr. Luan Pulling in 2 weeks 3. Continue home nebulizer treatments as needed for shortness of breath or wheezing 4. Remain on prednisone taper as prescribed  Home Health: Yes with PT, RN, and home health aide  Equipment/Devices: None, patient has home 4 L nasal cannula oxygen  Discharge Condition: Stable  CODE STATUS: Full  Diet recommendation: Heart Healthy/carb modified  Brief/Interim Summary:  73 year old oxygen and steroid-dependent COPD presented with 3 days of progressive shortness of breath.She was more confused and short of breath on 6/2 prompting transfer to stepdown unit for closer monitoring.She continuedto have some ongoing confusion on 6/3 and required BiPAP due to hypercapnia.On the morning of 6/4 she is overall doing much better and has remained off BiPAP most of the evening and was transitioned to nasal cannula. Pulmonology had seen patient given respiratory issues and had increased steroid dose which patient had responded well to.She continued to do better on the morning of 6/5 and 6/6 and steroids were weaned. She remained on her usual home 4 L nasal cannula.  She continue to wean her steroids through 6/7 and appears to be improving overall.  On the day of discharge she has no further symptomatic complaints or concerns and is otherwise doing well.  She will follow-up with pulmonology in the outpatient setting and continue use home nebulizer treatments as needed.  Steroid taper has been prescribed for her on discharge.  She has completed a 7-day course of cefepime from 5/30 to 6/5 while here.  No other acute events noted aside from above.  Discharge Diagnoses:   Principal Problem:   Acute and chronic respiratory failure (acute-on-chronic) (HCC) Active Problems:   COPD (chronic obstructive pulmonary disease) (HCC)   Essential hypertension   Atrial flutter (HCC)   Diabetes mellitus (HCC)   Chronic respiratory failure with hypoxia (HCC)   Left lower lobe pneumonia (HCC)   Leukocytosis  Principal discharge diagnosis: Acute on chronic hypoxemic respiratory failure secondary to COPD exacerbation with pneumonia.  Discharge Instructions  Discharge Instructions    Diet - low sodium heart healthy   Complete by:  As directed    Increase activity slowly   Complete by:  As directed      Allergies as of 08/19/2018      Reactions   Codeine Other (See Comments)   "jittery"      Medication List    TAKE these medications   acetaminophen 500 MG tablet Commonly known as:  TYLENOL Take 500 mg by mouth every 8 (eight) hours as needed for mild pain or headache.   amiodarone 200 MG tablet Commonly known as:  PACERONE Take 1 tablet (200 mg total) by mouth daily.   blood glucose meter kit and supplies Kit Dispense based on patient and insurance preference. Use up to four times daily as directed. (FOR ICD-9 250.00, 250.01).  Check sugar before meals and at night, keep a log.   Emergen-C Immune Pack Take 1 Package by mouth daily.   furosemide 40 MG tablet Commonly known as:  LASIX 20 mg daily.   guaiFENesin 600 MG 12 hr tablet Commonly known as:  MUCINEX Take 1 tablet (600 mg total) by mouth 2 (two) times daily.   meclizine 12.5 MG  tablet Commonly known as:  ANTIVERT Take 1 tablet by mouth 3 (three) times daily as needed.   metFORMIN 500 MG 24 hr tablet Commonly known as:  GLUCOPHAGE-XR Take 500 mg by mouth daily.   metoprolol tartrate 50 MG tablet Commonly known as:  LOPRESSOR Take 50 mg by mouth 2 (two) times daily.   OXYGEN Inhale 2.5-3 L into the lungs daily. At night and daily as needed   predniSONE 20 MG tablet Commonly known  as:  Deltasone Take 2 tablets (40 mg total) by mouth daily for 5 days. What changed:    medication strength  how much to take   albuterol (2.5 MG/3ML) 0.083% nebulizer solution Commonly known as:  PROVENTIL Take 2.5 mg by nebulization every 6 (six) hours as needed for wheezing or shortness of breath.   ProAir HFA 108 (90 Base) MCG/ACT inhaler Generic drug:  albuterol Inhale 2 puffs into the lungs every 6 (six) hours as needed for wheezing or shortness of breath.   psyllium 95 % Pack Commonly known as:  HYDROCIL/METAMUCIL Take 1 packet by mouth daily. What changed:    when to take this  reasons to take this   rivaroxaban 20 MG Tabs tablet Commonly known as:  Xarelto Take 1 tablet (20 mg total) by mouth daily with supper.   Trelegy Ellipta 100-62.5-25 MCG/INH Aepb Generic drug:  Fluticasone-Umeclidin-Vilant Take 1 puff by mouth daily.   Vitamin D (Ergocalciferol) 1.25 MG (50000 UT) Caps capsule Commonly known as:  DRISDOL Take 50,000 Units every 30 (thirty) days by mouth.      Follow-up Information    Lavella Lemons, PA Follow up in 1 week(s).   Specialty:  Physician Assistant Contact information: [place] [address] [phone]        Herminio Commons, MD .   Specialty:  Cardiology Contact information: [place] [address] [phone]        Sinda Du, MD Follow up in 2 week(s).   Specialty:  Pulmonary Disease Contact information: [address] [place] [address] [phone]          Allergies  Allergen Reactions  . Codeine Other (See Comments)    "jittery"    Consultations:  Pulmonology   Procedures/Studies: Dg Chest Port 1 View  Result Date: 08/15/2018 CLINICAL DATA:  Shortness of breath. EXAM: PORTABLE CHEST 1 VIEW COMPARISON:  Radiographs of August 13, 2018. FINDINGS: Stable cardiomediastinal silhouette. No pneumothorax is noted. Right lung is clear. Minimal left basilar  atelectasis is noted with possible associated pleural effusion. Bony thorax is unremarkable. IMPRESSION: Minimal left basilar subsegmental atelectasis is noted with minimal left pleural effusion. Electronically Signed   By: Marijo Conception M.D.   On: 08/15/2018 09:48   Dg Chest Port 1 View  Result Date: 08/10/2018 CLINICAL DATA:  Short of breath EXAM: PORTABLE CHEST 1 VIEW COMPARISON:  06/24/2018 FINDINGS: Upper normal heart size. Low lung volumes. Subsegmental atelectasis at the right base. Hazy airspace disease at the left base. Upper lungs clear. No pneumothorax. IMPRESSION: Left basilar consolidation. Followup PA and lateral chest X-ray is recommended in 3-4 weeks following trial of antibiotic therapy to ensure resolution and exclude underlying malignancy. Electronically Signed   By: Marybelle Killings M.D.   On: 08/10/2018 13:47   Dg Abd Acute 2+v W 1v Chest  Result Date: 08/13/2018 CLINICAL DATA:  Confusion, abdominal distension EXAM: DG ABDOMEN ACUTE W/ 1V CHEST COMPARISON:  Chest radiograph 08/10/2018 FINDINGS: Enlargement  of cardiac silhouette. Mediastinal contours and pulmonary vascularity normal. Lordotic positioning. Bibasilar atelectasis and questionable small bibasilar effusions. No definite infiltrate or pneumothorax. Bones demineralized. Nonobstructive bowel gas pattern. Increased stool in RIGHT colon. No bowel dilatation, bowel wall thickening or free air. No definite urinary tract calcifications. IMPRESSION: Nonobstructive bowel gas pattern with increased stool in RIGHT colon. Enlargement of cardiac silhouette. Bibasilar atelectasis and questionable small pleural effusions. Electronically Signed   By: Lavonia Dana M.D.   On: 08/13/2018 10:10     Discharge Exam: Vitals:   08/18/18 2105 08/19/18 0512  BP: 119/69 (!) 111/54  Pulse: 80 73  Resp: 19 20  Temp: 98.4 F (36.9 C) 98.1 F (36.7 C)  SpO2: 97% 96%   Vitals:   08/18/18 0754 08/18/18 1320 08/18/18 2105 08/19/18 0512  BP:  104/65  119/69 (!) 111/54  Pulse:  72 80 73  Resp:  16 19 20   Temp:  98.6 F (37 C) 98.4 F (36.9 C) 98.1 F (36.7 C)  TempSrc:  Oral Oral Oral  SpO2: 95% 97% 97% 96%  Weight:      Height:        General: Pt is alert, awake, not in acute distress Cardiovascular: RRR, S1/S2 +, no rubs, no gallops Respiratory: CTA bilaterally, no wheezing, no rhonchi, 4 L nasal cannula Abdominal: Soft, NT, ND, bowel sounds + Extremities: no edema, no cyanosis    The results of significant diagnostics from this hospitalization (including imaging, microbiology, ancillary and laboratory) are listed below for reference.     Microbiology: Recent Results (from the past 240 hour(s))  SARS Coronavirus 2 (CEPHEID - Performed in [place] hospital lab), Hosp Order     Status: None   Collection Time: 08/10/18 11:56 AM  Result Value Ref Range Status   SARS Coronavirus 2 NEGATIVE NEGATIVE Final    Comment: (NOTE) If result is NEGATIVE SARS-CoV-2 target nucleic acids are NOT DETECTED. The SARS-CoV-2 RNA is generally detectable in upper and lower  respiratory specimens during the acute phase of infection. The lowest  concentration of SARS-CoV-2 viral copies this assay can detect is 250  copies / mL. A negative result does not preclude SARS-CoV-2 infection  and should not be used as the sole basis for treatment or other  patient management decisions.  A negative result may occur with  improper specimen collection / handling, submission of specimen other  than nasopharyngeal swab, presence of viral mutation(s) within the  areas targeted by this assay, and inadequate number of viral copies  (<250 copies / mL). A negative result must be combined with clinical  observations, patient history, and epidemiological information. If result is POSITIVE SARS-CoV-2 target nucleic acids are DETECTED. The SARS-CoV-2 RNA is generally detectable in upper and lower  respiratory specimens dur ing the acute phase of infection.   Positive  results are indicative of active infection with SARS-CoV-2.  Clinical  correlation with patient history and other diagnostic information is  necessary to determine patient infection status.  Positive results do  not rule out bacterial infection or co-infection with other viruses. If result is PRESUMPTIVE POSTIVE SARS-CoV-2 nucleic acids MAY BE PRESENT.   A presumptive positive result was obtained on the submitted specimen  and confirmed on repeat testing.  While 2019 novel coronavirus  (SARS-CoV-2) nucleic acids may be present in the submitted sample  additional confirmatory testing may be necessary for epidemiological  and / or clinical management purposes  to differentiate between  SARS-CoV-2 and other Sarbecovirus currently known  to infect humans.  If clinically indicated additional testing with an alternate test  methodology [phone]) is advised. The SARS-CoV-2 RNA is generally  detectable in upper and lower respiratory sp ecimens during the acute  phase of infection. The expected result is Negative. Fact Sheet for Patients:  StrictlyIdeas.no Fact Sheet for Healthcare Providers: BankingDealers.co.za This test is not yet approved or cleared by the [place] FDA and has been authorized for detection and/or diagnosis of SARS-CoV-2 by FDA under an Emergency Use Authorization (EUA).  This EUA will remain in effect (meaning this test can be used) for the duration of the COVID-19 declaration under Section 564(b)(1) of the Act, 21 U.S.C. section 360bbb-3(b)(1), unless the authorization is terminated or revoked sooner. Performed at [facility], [address], [place], [place] [address]   Culture, blood (routine x 2) Call MD if unable to obtain prior to antibiotics being given     Status: None   Collection Time: 08/10/18  3:19 PM  Result Value Ref Range Status   Specimen Description BLOOD LEFT ARM  Final   Special Requests    Final    BOTTLES DRAWN AEROBIC AND ANAEROBIC Blood Culture adequate volume   Culture   Final    NO GROWTH 5 DAYS Performed at [facility], [address] [place], [place] [address]    Report Status 08/15/2018 FINAL  Final  Culture, blood (routine x 2) Call MD if unable to obtain prior to antibiotics being given     Status: None   Collection Time: 08/10/18  3:28 PM  Result Value Ref Range Status   Specimen Description BLOOD LEFT WRIST  Final   Special Requests   Final    BOTTLES DRAWN AEROBIC AND ANAEROBIC Blood Culture adequate volume   Culture   Final    NO GROWTH 5 DAYS Performed at [facility], [address], [place], [place] [address]    Report Status 08/15/2018 FINAL  Final  MRSA PCR Screening     Status: None   Collection Time: 08/10/18  4:19 PM  Result Value Ref Range Status   MRSA by PCR NEGATIVE NEGATIVE Final    Comment:        The GeneXpert MRSA Assay (FDA approved for NASAL specimens only), is one component of a comprehensive MRSA colonization surveillance program. It is not intended to diagnose MRSA infection nor to guide or monitor treatment for MRSA infections. Performed at [facility], [address], [place], [place] [address]   Culture, Urine     Status: None   Collection Time: 08/13/18  5:25 PM  Result Value Ref Range Status   Specimen Description   Final    URINE, RANDOM Performed at [facility], [address], [place], [place] [address]    Special Requests   Final    NONE Performed at [facility], [address], [place], [place] [address]    Culture   Final    NO GROWTH Performed at [facility], [place] [address], [place],  [address]    Report Status 08/15/2018 FINAL  Final     Labs: BNP (last 3 results) Recent Labs    01/20/18 0609 06/24/18 1302 08/10/18 1156  BNP 107.0* 894.0* 426.8*   Basic Metabolic Panel: Recent Labs  Lab 08/13/18 0506 08/15/18 0439 08/16/18 0433 08/18/18 0606  NA 141 141 140 141  K 5.2* 4.2  4.4 3.8  CL 97* 90* 89* 88*  CO2 34* 38* 38* 40*  GLUCOSE 243* 206*  216* 214*  BUN 28* 21 20 31*  CREATININE 0.64 0.56 0.57 0.68  CALCIUM 8.9 9.7 9.8 10.0  MG 2.4 2.4  --   --    Liver Function Tests: Recent Labs  Lab 08/13/18 0506  AST 29  ALT 37  ALKPHOS 51  BILITOT 0.5  PROT 6.1*  ALBUMIN 3.7   No results for input(s): LIPASE, AMYLASE in the last 168 hours. No results for input(s): AMMONIA in the last 168 hours. CBC: Recent Labs  Lab 08/13/18 0506 08/15/18 0439 08/16/18 0433 08/18/18 0606  WBC 13.8* 14.8* 13.0* 16.5*  NEUTROABS 12.8*  --   --   --   HGB 11.0* 12.0 12.0 11.8*  HCT 37.7 40.3 39.7 40.8  MCV 100.3* 97.6 96.1 98.3  PLT 256 260 249 315   Cardiac Enzymes: No results for input(s): CKTOTAL, CKMB, CKMBINDEX, TROPONINI in the last 168 hours. BNP: Invalid input(s): POCBNP CBG: Recent Labs  Lab 08/18/18 0717 08/18/18 1108 08/18/18 1620 08/18/18 2107 08/19/18 0718  GLUCAP 201* 300* 101* 190* 138*   D-Dimer No results for input(s): DDIMER in the last 72 hours. Hgb A1c No results for input(s): HGBA1C in the last 72 hours. Lipid Profile No results for input(s): CHOL, HDL, LDLCALC, TRIG, CHOLHDL, LDLDIRECT in the last 72 hours. Thyroid function studies No results for input(s): TSH, T4TOTAL, T3FREE, THYROIDAB in the last 72 hours.  Invalid input(s): FREET3 Anemia work up No results for input(s): VITAMINB12, FOLATE, FERRITIN, TIBC, IRON, RETICCTPCT in the last 72 hours. Urinalysis    Component Value Date/Time   COLORURINE YELLOW 08/13/2018 1725   APPEARANCEUR CLEAR 08/13/2018 1725   LABSPEC 1.020 08/13/2018 1725   PHURINE 5.0 08/13/2018 1725   GLUCOSEU NEGATIVE 08/13/2018 1725   HGBUR MODERATE (A) 08/13/2018 1725   BILIRUBINUR NEGATIVE 08/13/2018 1725   KETONESUR NEGATIVE 08/13/2018 1725   PROTEINUR NEGATIVE 08/13/2018 1725   UROBILINOGEN 0.2 09/09/2009 1010   NITRITE NEGATIVE 08/13/2018 1725   LEUKOCYTESUR SMALL (A) 08/13/2018 1725    Sepsis Labs Invalid input(s): PROCALCITONIN,  WBC,  LACTICIDVEN Microbiology Recent Results (from the past 240 hour(s))  SARS Coronavirus 2 (CEPHEID - Performed in [place] hospital lab), Hosp Order     Status: None   Collection Time: 08/10/18 11:56 AM  Result Value Ref Range Status   SARS Coronavirus 2 NEGATIVE NEGATIVE Final    Comment: (NOTE) If result is NEGATIVE SARS-CoV-2 target nucleic acids are NOT DETECTED. The SARS-CoV-2 RNA is generally detectable in upper and lower  respiratory specimens during the acute phase of infection. The lowest  concentration of SARS-CoV-2 viral copies this assay can detect is 250  copies / mL. A negative result does not preclude SARS-CoV-2 infection  and should not be used as the sole basis for treatment or other  patient management decisions.  A negative result may occur with  improper specimen collection / handling, submission of specimen other  than nasopharyngeal swab, presence of viral mutation(s) within the  areas targeted by this assay, and inadequate number of viral copies  (<250 copies / mL). A negative result must be combined with clinical  observations, patient history, and epidemiological information. If result is POSITIVE SARS-CoV-2 target nucleic acids are DETECTED. The SARS-CoV-2 RNA is generally detectable in upper and lower  respiratory specimens dur ing the acute phase of infection.  Positive  results are indicative of active infection with SARS-CoV-2.  Clinical  correlation with patient history and other diagnostic information is  necessary to determine patient infection status.  Positive results do  not rule out bacterial infection or co-infection with other viruses. If result is PRESUMPTIVE POSTIVE SARS-CoV-2 nucleic acids MAY BE PRESENT.   A presumptive positive result was obtained on the submitted specimen  and confirmed on repeat testing.  While 2019 novel coronavirus  (SARS-CoV-2) nucleic acids may be present in  the submitted sample  additional confirmatory testing may be necessary for epidemiological  and / or clinical management purposes  to differentiate between  SARS-CoV-2 and other Sarbecovirus currently known to infect humans.  If clinically indicated additional testing with an alternate test  methodology [phone]) is advised. The SARS-CoV-2 RNA is generally  detectable in upper and lower respiratory sp ecimens during the acute  phase of infection. The expected result is Negative. Fact Sheet for Patients:  StrictlyIdeas.no Fact Sheet for Healthcare Providers: BankingDealers.co.za This test is not yet approved or cleared by the [place] FDA and has been authorized for detection and/or diagnosis of SARS-CoV-2 by FDA under an Emergency Use Authorization (EUA).  This EUA will remain in effect (meaning this test can be used) for the duration of the COVID-19 declaration under Section 564(b)(1) of the Act, 21 U.S.C. section 360bbb-3(b)(1), unless the authorization is terminated or revoked sooner. Performed at [facility], [address], [place], [place] [address]   Culture, blood (routine x 2) Call MD if unable to obtain prior to antibiotics being given     Status: None   Collection Time: 08/10/18  3:19 PM  Result Value Ref Range Status   Specimen Description BLOOD LEFT ARM  Final   Special Requests   Final    BOTTLES DRAWN AEROBIC AND ANAEROBIC Blood Culture adequate volume   Culture   Final    NO GROWTH 5 DAYS Performed at [facility], [address], [place], [place] [address]    Report Status 08/15/2018 FINAL  Final  Culture, blood (routine x 2) Call MD if unable to obtain prior to antibiotics being given     Status: None   Collection Time: 08/10/18  3:28 PM  Result Value Ref Range Status   Specimen Description BLOOD LEFT WRIST  Final   Special Requests   Final    BOTTLES DRAWN AEROBIC AND ANAEROBIC Blood Culture adequate volume    Culture   Final    NO GROWTH 5 DAYS Performed at [facility], [address], [place], [place] [address]    Report Status 08/15/2018 FINAL  Final  MRSA PCR Screening     Status: None   Collection Time: 08/10/18  4:19 PM  Result Value Ref Range Status   MRSA by PCR NEGATIVE NEGATIVE Final    Comment:        The GeneXpert MRSA Assay (FDA approved for NASAL specimens only), is one component of a comprehensive MRSA colonization surveillance program. It is not intended to diagnose MRSA infection nor to guide or monitor treatment for MRSA infections. Performed at [facility], [address], [place], [place] [address]   Culture, Urine     Status: None   Collection Time: 08/13/18  5:25 PM  Result Value Ref Range Status   Specimen Description   Final    URINE, RANDOM Performed at [facility], [address] [place], [place] [address]    Special Requests   Final    NONE Performed at [facility], [address] [place], [place] [address]    Culture   Final    NO GROWTH Performed at [facility],  [address] [place], [place] [address]    Report Status 08/15/2018 FINAL  Final     Time coordinating discharge: 35 minutes  SIGNED:   Rodena Goldmann, DO [facility] 08/19/2018, 8:40 AM  If 7PM-7AM, please contact night-coverage [email or web address] Password [email or web address]

## 2018-08-20 ENCOUNTER — Other Ambulatory Visit: Payer: Self-pay

## 2018-08-20 ENCOUNTER — Emergency Department (HOSPITAL_COMMUNITY): Payer: Medicare Other

## 2018-08-20 ENCOUNTER — Encounter (HOSPITAL_COMMUNITY): Payer: Self-pay

## 2018-08-20 ENCOUNTER — Other Ambulatory Visit: Payer: Self-pay | Admitting: *Deleted

## 2018-08-20 ENCOUNTER — Inpatient Hospital Stay (HOSPITAL_COMMUNITY)
Admission: EM | Admit: 2018-08-20 | Discharge: 2018-08-22 | DRG: 190 | Disposition: A | Discharge status: Home Health | Payer: Medicare Other | Attending: Internal Medicine | Admitting: Internal Medicine

## 2018-08-20 DIAGNOSIS — I4892 Unspecified atrial flutter: Secondary | ICD-10-CM | POA: Diagnosis present

## 2018-08-20 DIAGNOSIS — S32010A Wedge compression fracture of first lumbar vertebra, initial encounter for closed fracture: Secondary | ICD-10-CM

## 2018-08-20 DIAGNOSIS — Z7952 Long term (current) use of systemic steroids: Secondary | ICD-10-CM | POA: Diagnosis not present

## 2018-08-20 DIAGNOSIS — Z87891 Personal history of nicotine dependence: Secondary | ICD-10-CM

## 2018-08-20 DIAGNOSIS — S32019A Unspecified fracture of first lumbar vertebra, initial encounter for closed fracture: Secondary | ICD-10-CM | POA: Diagnosis not present

## 2018-08-20 DIAGNOSIS — Z9011 Acquired absence of right breast and nipple: Secondary | ICD-10-CM

## 2018-08-20 DIAGNOSIS — J9601 Acute respiratory failure with hypoxia: Secondary | ICD-10-CM | POA: Diagnosis not present

## 2018-08-20 DIAGNOSIS — K5730 Diverticulosis of large intestine without perforation or abscess without bleeding: Secondary | ICD-10-CM | POA: Diagnosis not present

## 2018-08-20 DIAGNOSIS — Z853 Personal history of malignant neoplasm of breast: Secondary | ICD-10-CM | POA: Diagnosis not present

## 2018-08-20 DIAGNOSIS — R0602 Shortness of breath: Secondary | ICD-10-CM | POA: Diagnosis not present

## 2018-08-20 DIAGNOSIS — I4891 Unspecified atrial fibrillation: Secondary | ICD-10-CM | POA: Diagnosis not present

## 2018-08-20 DIAGNOSIS — Z7901 Long term (current) use of anticoagulants: Secondary | ICD-10-CM

## 2018-08-20 DIAGNOSIS — Z9181 History of falling: Secondary | ICD-10-CM | POA: Diagnosis not present

## 2018-08-20 DIAGNOSIS — E1165 Type 2 diabetes mellitus with hyperglycemia: Secondary | ICD-10-CM | POA: Diagnosis present

## 2018-08-20 DIAGNOSIS — J440 Chronic obstructive pulmonary disease with acute lower respiratory infection: Secondary | ICD-10-CM | POA: Diagnosis not present

## 2018-08-20 DIAGNOSIS — J441 Chronic obstructive pulmonary disease with (acute) exacerbation: Secondary | ICD-10-CM | POA: Diagnosis not present

## 2018-08-20 DIAGNOSIS — T380X5A Adverse effect of glucocorticoids and synthetic analogues, initial encounter: Secondary | ICD-10-CM | POA: Diagnosis not present

## 2018-08-20 DIAGNOSIS — J189 Pneumonia, unspecified organism: Secondary | ICD-10-CM | POA: Diagnosis not present

## 2018-08-20 DIAGNOSIS — Z79899 Other long term (current) drug therapy: Secondary | ICD-10-CM | POA: Diagnosis not present

## 2018-08-20 DIAGNOSIS — Z9981 Dependence on supplemental oxygen: Secondary | ICD-10-CM | POA: Diagnosis not present

## 2018-08-20 DIAGNOSIS — K5900 Constipation, unspecified: Secondary | ICD-10-CM | POA: Diagnosis not present

## 2018-08-20 DIAGNOSIS — J9811 Atelectasis: Secondary | ICD-10-CM | POA: Diagnosis present

## 2018-08-20 DIAGNOSIS — M545 Low back pain: Secondary | ICD-10-CM | POA: Diagnosis not present

## 2018-08-20 DIAGNOSIS — Z7401 Bed confinement status: Secondary | ICD-10-CM | POA: Diagnosis not present

## 2018-08-20 DIAGNOSIS — I10 Essential (primary) hypertension: Secondary | ICD-10-CM | POA: Diagnosis not present

## 2018-08-20 DIAGNOSIS — Z20828 Contact with and (suspected) exposure to other viral communicable diseases: Secondary | ICD-10-CM | POA: Diagnosis present

## 2018-08-20 DIAGNOSIS — R69 Illness, unspecified: Secondary | ICD-10-CM | POA: Diagnosis not present

## 2018-08-20 DIAGNOSIS — N200 Calculus of kidney: Secondary | ICD-10-CM | POA: Diagnosis not present

## 2018-08-20 DIAGNOSIS — R410 Disorientation, unspecified: Secondary | ICD-10-CM

## 2018-08-20 DIAGNOSIS — Z885 Allergy status to narcotic agent status: Secondary | ICD-10-CM

## 2018-08-20 DIAGNOSIS — J9621 Acute and chronic respiratory failure with hypoxia: Secondary | ICD-10-CM | POA: Diagnosis not present

## 2018-08-20 DIAGNOSIS — E119 Type 2 diabetes mellitus without complications: Secondary | ICD-10-CM | POA: Diagnosis not present

## 2018-08-20 DIAGNOSIS — Z833 Family history of diabetes mellitus: Secondary | ICD-10-CM

## 2018-08-20 DIAGNOSIS — M4856XA Collapsed vertebra, not elsewhere classified, lumbar region, initial encounter for fracture: Secondary | ICD-10-CM | POA: Diagnosis present

## 2018-08-20 DIAGNOSIS — R5381 Other malaise: Secondary | ICD-10-CM | POA: Diagnosis not present

## 2018-08-20 DIAGNOSIS — Z7984 Long term (current) use of oral hypoglycemic drugs: Secondary | ICD-10-CM | POA: Diagnosis not present

## 2018-08-20 LAB — COMPREHENSIVE METABOLIC PANEL
ALT: 27 U/L (ref 0–44)
AST: 20 U/L (ref 15–41)
Albumin: 3.7 g/dL (ref 3.5–5.0)
Alkaline Phosphatase: 68 U/L (ref 38–126)
Anion gap: 15 (ref 5–15)
BUN: 31 mg/dL — ABNORMAL HIGH (ref 8–23)
CO2: 34 mmol/L — ABNORMAL HIGH (ref 22–32)
Calcium: 9.6 mg/dL (ref 8.9–10.3)
Chloride: 92 mmol/L — ABNORMAL LOW (ref 98–111)
Creatinine, Ser: 0.73 mg/dL (ref 0.44–1.00)
GFR calc Af Amer: >60 mL/min (ref >60)
GFR calc non Af Amer: >60 mL/min (ref >60)
Glucose, Bld: 411 mg/dL — ABNORMAL HIGH (ref 70–99)
Potassium: 4.4 mmol/L (ref 3.5–5.1)
Sodium: 141 mmol/L (ref 135–145)
Total Bilirubin: 0.8 mg/dL (ref 0.3–1.2)
Total Protein: 6.3 g/dL — ABNORMAL LOW (ref 6.5–8.1)

## 2018-08-20 LAB — LACTIC ACID, PLASMA
Lactic Acid, Venous: 1.1 mmol/L (ref 0.5–1.9)
Lactic Acid, Venous: 1.3 mmol/L (ref 0.5–1.9)

## 2018-08-20 LAB — CBG MONITORING, ED: Glucose-Capillary: 263 mg/dL — ABNORMAL HIGH (ref 70–99)

## 2018-08-20 LAB — CBC WITH DIFFERENTIAL/PLATELET
Abs Immature Granulocytes: 0.23 10*3/uL — ABNORMAL HIGH (ref 0.00–0.07)
Basophils Absolute: 0 10*3/uL (ref 0.0–0.1)
Basophils Relative: 0 %
Eosinophils Absolute: 0.1 10*3/uL (ref 0.0–0.5)
Eosinophils Relative: 0 %
HCT: 38.6 % (ref 36.0–46.0)
Hemoglobin: 11.8 g/dL — ABNORMAL LOW (ref 12.0–15.0)
Immature Granulocytes: 2 %
Lymphocytes Relative: 3 %
Lymphs Abs: 0.4 10*3/uL — ABNORMAL LOW (ref 0.7–4.0)
MCH: 29.5 pg (ref 26.0–34.0)
MCHC: 30.6 g/dL (ref 30.0–36.0)
MCV: 96.5 fL (ref 80.0–100.0)
Monocytes Absolute: 0.7 10*3/uL (ref 0.1–1.0)
Monocytes Relative: 5 %
Neutro Abs: 11.9 10*3/uL — ABNORMAL HIGH (ref 1.7–7.7)
Neutrophils Relative %: 90 %
Platelets: 276 10*3/uL (ref 150–400)
RBC: 4 MIL/uL (ref 3.87–5.11)
RDW: 13.2 % (ref 11.5–15.5)
WBC: 13.4 10*3/uL — ABNORMAL HIGH (ref 4.0–10.5)
nRBC: 0 % (ref 0.0–0.2)

## 2018-08-20 LAB — URINALYSIS, ROUTINE W REFLEX MICROSCOPIC
Bacteria, UA: NONE SEEN
Bilirubin Urine: NEGATIVE
Glucose, UA: >=500 mg/dL — AB
Ketones, ur: 5 mg/dL — AB
Leukocytes,Ua: NEGATIVE
Nitrite: NEGATIVE
Protein, ur: NEGATIVE mg/dL
Specific Gravity, Urine: 1.03 (ref 1.005–1.030)
pH: 5 (ref 5.0–8.0)

## 2018-08-20 LAB — LIPASE, BLOOD: Lipase: 51 U/L (ref 11–51)

## 2018-08-20 LAB — APTT: aPTT: <20 seconds — ABNORMAL LOW (ref 24–36)

## 2018-08-20 LAB — SARS CORONAVIRUS 2 BY RT PCR (HOSPITAL ORDER, PERFORMED IN ~~LOC~~ HOSPITAL LAB): SARS Coronavirus 2: NEGATIVE

## 2018-08-20 LAB — BLOOD GAS, ARTERIAL
Acid-Base Excess: 16 mmol/L — ABNORMAL HIGH (ref 0.0–2.0)
Bicarbonate: 37.9 mmol/L — ABNORMAL HIGH (ref 20.0–28.0)
FIO2: 36
O2 Saturation: 91.6 %
Patient temperature: 37
pCO2 arterial: 70 mmHg (ref 32.0–48.0)
pH, Arterial: 7.394 (ref 7.350–7.450)
pO2, Arterial: 68 mmHg — ABNORMAL LOW (ref 83.0–108.0)

## 2018-08-20 LAB — BRAIN NATRIURETIC PEPTIDE: B Natriuretic Peptide: 889 pg/mL — ABNORMAL HIGH (ref 0.0–100.0)

## 2018-08-20 LAB — GLUCOSE, CAPILLARY: Glucose-Capillary: 316 mg/dL — ABNORMAL HIGH (ref 70–99)

## 2018-08-20 LAB — TROPONIN I: Troponin I: 0.03 ng/mL (ref <0.03)

## 2018-08-20 LAB — HEPARIN LEVEL (UNFRACTIONATED): Heparin Unfractionated: 0.23 IU/mL — ABNORMAL LOW (ref 0.30–0.70)

## 2018-08-20 LAB — PROTIME-INR
INR: 1 (ref 0.8–1.2)
Prothrombin Time: 12.6 seconds (ref 11.4–15.2)

## 2018-08-20 MED ORDER — PREDNISONE 20 MG PO TABS
40.0000 mg | ORAL_TABLET | Freq: Every day | ORAL | Status: DC
  Administered 2018-08-20 – 2018-08-22 (×3): 40 mg via ORAL
  Filled 2018-08-20 (×3): qty 2

## 2018-08-20 MED ORDER — METFORMIN HCL ER 500 MG PO TB24
500.0000 mg | ORAL_TABLET | Freq: Every day | ORAL | Status: DC
  Filled 2018-08-20: qty 1

## 2018-08-20 MED ORDER — ALBUTEROL (5 MG/ML) CONTINUOUS INHALATION SOLN
10.0000 mg/h | INHALATION_SOLUTION | Freq: Once | RESPIRATORY_TRACT | Status: AC
  Administered 2018-08-20: 10 mg/h via RESPIRATORY_TRACT
  Filled 2018-08-20: qty 20

## 2018-08-20 MED ORDER — INSULIN ASPART 100 UNIT/ML ~~LOC~~ SOLN
3.0000 [IU] | Freq: Three times a day (TID) | SUBCUTANEOUS | Status: DC
  Administered 2018-08-20 – 2018-08-22 (×5): 3 [IU] via SUBCUTANEOUS
  Filled 2018-08-20: qty 1

## 2018-08-20 MED ORDER — POLYETHYLENE GLYCOL 3350 17 G PO PACK: 17.0000 g | PACK | Freq: Every day | ORAL | Status: DC | PRN

## 2018-08-20 MED ORDER — BISACODYL 10 MG RE SUPP
10.0000 mg | Freq: Once | RECTAL | Status: AC
  Administered 2018-08-20: 10 mg via RECTAL
  Filled 2018-08-20: qty 1

## 2018-08-20 MED ORDER — METOPROLOL TARTRATE 50 MG PO TABS
50.0000 mg | ORAL_TABLET | Freq: Two times a day (BID) | ORAL | Status: DC
  Administered 2018-08-20 – 2018-08-22 (×5): 50 mg via ORAL
  Filled 2018-08-20 (×5): qty 1

## 2018-08-20 MED ORDER — SODIUM CHLORIDE 0.9 % IV SOLN: 250.0000 mL | INTRAVENOUS | Status: DC | PRN

## 2018-08-20 MED ORDER — IPRATROPIUM-ALBUTEROL 0.5-2.5 (3) MG/3ML IN SOLN: 3.0000 mL | Freq: Four times a day (QID) | RESPIRATORY_TRACT | Status: DC | PRN

## 2018-08-20 MED ORDER — BUDESONIDE 0.25 MG/2ML IN SUSP
0.2500 mg | Freq: Two times a day (BID) | RESPIRATORY_TRACT | Status: DC
  Administered 2018-08-20 – 2018-08-22 (×4): 0.25 mg via RESPIRATORY_TRACT
  Filled 2018-08-20 (×4): qty 2

## 2018-08-20 MED ORDER — TRAMADOL HCL 50 MG PO TABS
50.0000 mg | ORAL_TABLET | Freq: Four times a day (QID) | ORAL | Status: DC | PRN
  Administered 2018-08-21 – 2018-08-22 (×2): 50 mg via ORAL
  Filled 2018-08-20 (×2): qty 1

## 2018-08-20 MED ORDER — GUAIFENESIN ER 600 MG PO TB12
600.0000 mg | ORAL_TABLET | Freq: Two times a day (BID) | ORAL | Status: DC
  Administered 2018-08-20 – 2018-08-22 (×5): 600 mg via ORAL
  Filled 2018-08-20 (×5): qty 1

## 2018-08-20 MED ORDER — KETOROLAC TROMETHAMINE 15 MG/ML IJ SOLN
15.0000 mg | Freq: Four times a day (QID) | INTRAMUSCULAR | Status: DC | PRN
  Administered 2018-08-20 – 2018-08-21 (×2): 15 mg via INTRAVENOUS
  Filled 2018-08-20 (×2): qty 1

## 2018-08-20 MED ORDER — ACETAMINOPHEN 650 MG RE SUPP: 650.0000 mg | Freq: Four times a day (QID) | RECTAL | Status: DC | PRN

## 2018-08-20 MED ORDER — INSULIN ASPART 100 UNIT/ML ~~LOC~~ SOLN
0.0000 [IU] | Freq: Three times a day (TID) | SUBCUTANEOUS | Status: DC
  Administered 2018-08-20: 11 [IU] via SUBCUTANEOUS
  Administered 2018-08-21: 12:00:00 15 [IU] via SUBCUTANEOUS
  Administered 2018-08-21: 4 [IU] via SUBCUTANEOUS
  Administered 2018-08-22: 3 [IU] via SUBCUTANEOUS
  Administered 2018-08-22: 11 [IU] via SUBCUTANEOUS
  Filled 2018-08-20: qty 1

## 2018-08-20 MED ORDER — IPRATROPIUM-ALBUTEROL 0.5-2.5 (3) MG/3ML IN SOLN
3.0000 mL | Freq: Once | RESPIRATORY_TRACT | Status: AC
  Administered 2018-08-20: 3 mL via RESPIRATORY_TRACT
  Filled 2018-08-20: qty 3

## 2018-08-20 MED ORDER — METHYLPREDNISOLONE SODIUM SUCC 125 MG IJ SOLR
125.0000 mg | Freq: Once | INTRAMUSCULAR | Status: AC
  Administered 2018-08-20: 15:00:00 125 mg via INTRAVENOUS
  Filled 2018-08-20: qty 2

## 2018-08-20 MED ORDER — IPRATROPIUM BROMIDE 0.02 % IN SOLN
1.0000 mg | Freq: Once | RESPIRATORY_TRACT | Status: AC
  Administered 2018-08-20: 15:00:00 1 mg via RESPIRATORY_TRACT
  Filled 2018-08-20: qty 5

## 2018-08-20 MED ORDER — ONDANSETRON HCL 4 MG PO TABS: 4.0000 mg | ORAL_TABLET | Freq: Four times a day (QID) | ORAL | Status: DC | PRN

## 2018-08-20 MED ORDER — ONDANSETRON HCL 4 MG/2ML IJ SOLN: 4.0000 mg | Freq: Four times a day (QID) | INTRAMUSCULAR | Status: DC | PRN

## 2018-08-20 MED ORDER — SODIUM CHLORIDE 0.9% FLUSH
3.0000 mL | Freq: Two times a day (BID) | INTRAVENOUS | Status: DC
  Administered 2018-08-20 – 2018-08-21 (×4): 3 mL via INTRAVENOUS

## 2018-08-20 MED ORDER — INSULIN ASPART 100 UNIT/ML ~~LOC~~ SOLN
0.0000 [IU] | Freq: Every day | SUBCUTANEOUS | Status: DC
  Administered 2018-08-20: 4 [IU] via SUBCUTANEOUS

## 2018-08-20 MED ORDER — FUROSEMIDE 20 MG PO TABS
20.0000 mg | ORAL_TABLET | Freq: Every day | ORAL | Status: DC
  Administered 2018-08-21 – 2018-08-22 (×2): 20 mg via ORAL
  Filled 2018-08-20 (×3): qty 1

## 2018-08-20 MED ORDER — HEPARIN (PORCINE) 25000 UT/250ML-% IV SOLN
1050.0000 [IU]/h | INTRAVENOUS | Status: DC
  Administered 2018-08-21 (×2): 900 [IU]/h via INTRAVENOUS
  Filled 2018-08-20: qty 250

## 2018-08-20 MED ORDER — AMIODARONE HCL 200 MG PO TABS
200.0000 mg | ORAL_TABLET | Freq: Every day | ORAL | Status: DC
  Administered 2018-08-21 – 2018-08-22 (×2): 200 mg via ORAL
  Filled 2018-08-20 (×3): qty 1

## 2018-08-20 MED ORDER — METFORMIN HCL ER 500 MG PO TB24
ORAL_TABLET | ORAL | Status: AC
  Filled 2018-08-20: qty 1

## 2018-08-20 MED ORDER — SODIUM CHLORIDE 0.9% FLUSH: 3.0000 mL | INTRAVENOUS | Status: DC | PRN

## 2018-08-20 MED ORDER — ACETAMINOPHEN 325 MG PO TABS
650.0000 mg | ORAL_TABLET | Freq: Four times a day (QID) | ORAL | Status: DC | PRN
  Administered 2018-08-20: 650 mg via ORAL
  Filled 2018-08-20: qty 2

## 2018-08-20 NOTE — ED Notes (Signed)
Increased O2 to 4 L/via [place] to obtain sat of 97%

## 2018-08-20 NOTE — ED Notes (Signed)
Date and time results received: 08/20/18 1448 (use smartphrase ".now" to insert current time)  Test: troponin Critical Value: 0.03  Name of Provider Notified: Thurnell Garbe MD  Orders Received? Or Actions Taken?: n/a

## 2018-08-20 NOTE — Progress Notes (Signed)
Inpatient Diabetes Program Recommendations  AACE/ADA: New Consensus Statement on Inpatient Glycemic Control (2015)  Target Ranges:  Prepandial:   less than 140 mg/dL      Peak postprandial:   less than 180 mg/dL (1-2 hours)      Critically ill patients:  140 - 180 mg/dL   Lab Results  Component Value Date   GLUCAP 209 (H) 08/19/2018   HGBA1C 8.6 (H) 06/24/2018    Review of Glycemic Control  Diabetes history: DM2 Outpatient Diabetes medications: metformin 500 mg QD Current orders for Inpatient glycemic control: None  HgbA1C - 8.6%  Inpatient Diabetes Program Recommendations:     Add Novolog 0-15 units tidwc and hs If eating > 50%, may need meal coverage insulin - Novolog 3 units tidwc.  Will follow.  Thank you. Lorenda Peck, RD, LDN, CDE Inpatient Diabetes Coordinator [phone]

## 2018-08-20 NOTE — ED Notes (Signed)
RT at bedside.

## 2018-08-20 NOTE — Patient Outreach (Signed)
Outreach call to patient for post hospital discharge follow up, Primary MD office completes transition of care, female answered the telephone and states "she's not here" and hung up.  RN CM noticed per EMR, pt is at ED with low oxygen saturations.  PLAN Continue to follow  Jacqlyn Larsen [facility], BSN [place] Coordinator [phone]

## 2018-08-20 NOTE — ED Provider Notes (Signed)
[facility] EMERGENCY DEPARTMENT Provider Note   CSN: [id number] Arrival date & time: 08/20/18  1058    History   Chief Complaint Chief Complaint  Patient presents with   Shortness of Breath   Constipation    HPI Eileen [name] is a 73 y.o. female.     The history is provided by the patient, the EMS personnel and a caregiver. The history is limited by the condition of the patient (Confused).  Shortness of Breath  Constipation   Pt was seen at 1200. Per EMS and pt's Home Health RN: Pt found by [place] RN with O2 Sats 76% while wearing her O2 N/C. EMS states they increased O2 via N/C with Sats increasing to 91%. EMS states pt's house was "very hot." Pt c/o SOB and abd "pains," stating she is "constipated." On arrival to ED, pt's O2 Sats 64%. Pt herself denies any other complaints. Pt confused regarding her meds and if she has taken them today. Pt apparently was just discharged from the hospital yesterday for dx COPD exacerbation and SNF was recommended.    Past Medical History:  Diagnosis Date   Asthma    Atrial flutter ([place])    Cancer ([place])    Right breast   COPD (chronic obstructive pulmonary disease) ([place])    History of breast cancer    right breast   Hypertension    On home O2    Type 2 diabetes mellitus ([place])     Patient Active Problem List   Diagnosis Date Noted   Acute and chronic respiratory failure (acute-on-chronic) ([place]) 08/10/2018   Left lower lobe pneumonia ([place]) 08/10/2018   Leukocytosis 08/10/2018   Chronic respiratory failure with hypoxia ([place]) 06/28/2018   Acute upper GI bleeding 01/20/2018   Lower GI bleed 01/20/2018   Acute respiratory failure with hypoxia ([place]) 09/17/2017   Diabetes mellitus ([place]) 08/31/2017   Acute hypoxemic respiratory failure ([place]) 08/31/2017   Atrial fibrillation with RVR ([place]) 09/24/2016   Atrial flutter ([place]) 09/23/2016   Obstructive chronic bronchitis with exacerbation ([place]) 05/23/2016   Acute  on chronic respiratory failure ([place]) 21/22/4825   Metabolic encephalopathy 00/37/0488   HCAP (healthcare-associated pneumonia) 03/24/2015   Essential hypertension    Anticoagulation management encounter    New onset atrial flutter ([place]) 03/21/2015   Obesity 03/21/2015   Atrial flutter with rapid ventricular response ([place]) 03/21/2015   COPD (chronic obstructive pulmonary disease) ([place]) 12/20/2014   COPD exacerbation ([place]) 12/20/2014   DCIS (ductal carcinoma in situ) of breast, right, 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, Arimadex 03/10/2011    Past Surgical History:  Procedure Laterality Date   BREAST SURGERY  2011   Right breast mastectomy   CARDIOVERSION N/A 06/27/2018   Procedure: CARDIOVERSION;  Surgeon: Arnoldo Lenis, MD;  Location: [facility];  Service: Endoscopy;  Laterality: N/A;   CESAREAN SECTION     COLONOSCOPY N/A 01/22/2018   Procedure: COLONOSCOPY;  Surgeon: Rogene Houston, MD;  Location: [facility];  Service: Endoscopy;  Laterality: N/A;   HERNIA REPAIR     RIH   MASTECTOMY  2011   right breast   TEE WITHOUT CARDIOVERSION N/A 06/27/2018   Procedure: TRANSESOPHAGEAL ECHOCARDIOGRAM (TEE) WITH PROPOFOL;  Surgeon: Arnoldo Lenis, MD;  Location: [facility];  Service: Endoscopy;  Laterality: N/A;     OB History    Gravida      Para      Term      Preterm  AB      Living  5     SAB      TAB      Ectopic      Multiple      Live Births               Home Medications    Prior to Admission medications   Medication Sig Start Date End Date Taking? Authorizing Provider  blood glucose meter kit and supplies KIT Dispense based on patient and insurance preference. Use up to four times daily as directed. (FOR ICD-9 250.00, 250.01).  Check sugar before meals and at night, keep a log. 09/29/16  Yes Arrien, Jimmy Picket, MD  OXYGEN Inhale 2.5-3 L into the lungs daily. At night and daily as needed   Yes [provider]  acetaminophen (TYLENOL) 500 MG tablet Take 500 mg by mouth every 8 (eight) hours as needed for mild pain or headache.     Lavella Lemons, PA  albuterol (PROVENTIL) (2.5 MG/3ML) 0.083% nebulizer solution Take 2.5 mg by nebulization every 6 (six) hours as needed for wheezing or shortness of breath.    [provider]  amiodarone (PACERONE) 200 MG tablet Take 1 tablet (200 mg total) by mouth daily. 07/20/18   Strader, Fransisco Hertz, PA-C  furosemide (LASIX) 40 MG tablet 20 mg daily.  07/10/18   [provider]  guaiFENesin (MUCINEX) 600 MG 12 hr tablet Take 1 tablet (600 mg total) by mouth 2 (two) times daily. 09/21/17   Kathie Dike, MD  meclizine (ANTIVERT) 12.5 MG tablet Take 1 tablet by mouth 3 (three) times daily as needed. 03/21/18   [provider]  metFORMIN (GLUCOPHAGE-XR) 500 MG 24 hr tablet Take 500 mg by mouth daily. 08/10/17   [provider]  metoprolol tartrate (LOPRESSOR) 50 MG tablet Take 50 mg by mouth 2 (two) times daily. 04/12/18   [provider]  Multiple Vitamins-Minerals (EMERGEN-C IMMUNE) PACK Take 1 Package by mouth daily.    [provider]  predniSONE (DELTASONE) 20 MG tablet Take 2 tablets (40 mg total) by mouth daily for 5 days. 08/19/18 08/24/18  Manuella Ghazi, Pratik D, DO  PROAIR HFA 108 (90 BASE) MCG/ACT inhaler Inhale 2 puffs into the lungs every 6 (six) hours as needed for wheezing or shortness of breath.  03/06/11   [provider]  psyllium (HYDROCIL/METAMUCIL) 95 % PACK Take 1 packet by mouth daily. Patient taking differently: Take 1 packet by mouth daily as needed.  01/24/18   Johnson, Clanford L, MD  rivaroxaban (XARELTO) 20 MG TABS tablet Take 1 tablet (20 mg total) by mouth daily with supper. 01/27/18   Johnson, Clanford L, MD  TRELEGY ELLIPTA 100-62.5-25 MCG/INH AEPB Take 1 puff by mouth daily. 08/27/17   [provider]  Vitamin D, Ergocalciferol, (DRISDOL) 50000 units CAPS capsule Take 50,000 Units  every 30 (thirty) days by mouth.    [provider]    Family History Family History  Problem Relation Age of Onset   Cancer Mother        lung   Diabetes Brother     Social History Social History   Tobacco Use   Smoking status: Former Smoker    Packs/day: 0.50    Years: 32.00    Pack years: 16.00    Types: Cigarettes    Start date: 12/12/1962    Last attempt to quit: 03/13/1994    Years since quitting: 24.4   Smokeless tobacco: Never Used  Substance Use Topics   Alcohol use: No    Alcohol/week: 0.0 standard drinks   Drug use: No     Allergies   Codeine   Review of Systems Review of Systems  Unable to perform ROS: Mental status change  Respiratory: Positive for shortness of breath.   Gastrointestinal: Positive for constipation.     Physical Exam Updated Vital Signs BP 120/77    Pulse 82    Temp 97.8 F (36.6 C) (Oral)    Resp 15    Ht _0  (1.575 m)    Wt 76 kg    SpO2 100%    BMI 30.65 kg/m    Patient Vitals for the past 24 hrs:  BP Temp Temp src Pulse Resp SpO2 Height Weight  08/20/18 1504 120/77 -- -- 82 15 100 % -- --  08/20/18 1237 -- -- -- -- -- 95 % -- --  08/20/18 1215 -- -- -- 80 (!) 21 91 % -- --  08/20/18 1153 -- -- -- 78 (!) 26 93 % -- --  08/20/18 1109 108/84 97.8 F (36.6 C) Oral 83 -- 94 % -- --  08/20/18 1108 -- -- -- -- -- -- _1  (1.575 m) 76 kg     Physical Exam 1205: Physical examination:  Nursing notes reviewed; Vital signs and O2 SAT reviewed;  Constitutional: Well developed, Well nourished, In no acute distress; Head:  Normocephalic, atraumatic; Eyes: EOMI, PERRL, No scleral icterus; ENMT: Mouth and pharynx normal, Mucous membranes dry; Neck: Supple, Full range of motion, No lymphadenopathy; Cardiovascular: Regular rate and rhythm, No gallop; Respiratory: Breath sounds diminished & equal bilaterally, No wheezes.  Speaking full sentences with ease, Normal respiratory effort/excursion; Chest: Nontender, Movement normal;  Abdomen: Soft, Nontender, Nondistended, Normal bowel sounds; Genitourinary: No CVA tenderness; Extremities: Peripheral pulses normal, No tenderness, No edema, No calf edema or asymmetry.; Neuro: Awake, alert, confused re: events, time. No facial droop. Speech clear. Moves all extremities on stretcher without apparent gross focal motor deficits in extremities.; Skin: Color normal, Warm, Dry.   ED Treatments / Results  Labs (all labs ordered are listed, but only abnormal results are displayed)   EKG EKG Interpretation  Date/Time:  Tuesday August 20 2018 11:32:53 EDT Ventricular Rate:  81 PR Interval:    QRS Duration: 92 QT Interval:  406 QTC Calculation: 472 R Axis:   56 Text Interpretation:  Sinus rhythm Baseline wander When compared with ECG of 08/10/2018 No significant change was found Confirmed by Francine Graven [phone]) on 08/20/2018 12:23:52 PM   Radiology   Procedures Procedures (including critical care time)  Medications Ordered in ED Medications  ipratropium-albuterol (DUONEB) 0.5-2.5 (3) MG/3ML nebulizer solution 3 mL (3 mLs Nebulization Given 08/20/18 1237)  methylPREDNISolone sodium succinate (SOLU-MEDROL) 125 mg/2 mL injection 125 mg (125 mg Intravenous Given 08/20/18 1503)  albuterol (PROVENTIL,VENTOLIN) solution continuous neb (10 mg/hr Nebulization Given 08/20/18 1445)  ipratropium (ATROVENT) nebulizer solution 1 mg (1 mg Nebulization Given 08/20/18 1445)     Initial Impression / Assessment and Plan / ED Course  I have reviewed the triage vital signs and the nursing notes.  Pertinent labs & imaging results that were available during my care of the patient were reviewed by me and considered in my medical decision making (see chart for details).     MDM Reviewed: previous chart, nursing note and vitals Reviewed previous: labs and ECG Interpretation: labs, ECG, x-ray and CT scan Total time providing critical care: 30-74 minutes. This excludes time spent  performing  separately reportable procedures and services. Consults: admitting MD   CRITICAL CARE Performed by: Francine Graven Total critical care time: 35 minutes Critical care time was exclusive of separately billable procedures and treating other patients. Critical care was necessary to treat or prevent imminent or life-threatening deterioration. Critical care was time spent personally by me on the following activities: development of treatment plan with patient and/or surrogate as well as nursing, discussions with consultants, evaluation of patient's response to treatment, examination of patient, obtaining history from patient or surrogate, ordering and performing treatments and interventions, ordering and review of laboratory studies, ordering and review of radiographic studies, pulse oximetry and re-evaluation of patient's condition.   Results for orders placed or performed during the hospital encounter of 08/20/18  SARS Coronavirus 2 (CEPHEID - Performed in [facility] hospital lab), [facility] Order  Result Value Ref Range   SARS Coronavirus 2 NEGATIVE NEGATIVE  Comprehensive metabolic panel  Result Value Ref Range   Sodium 141 135 - 145 mmol/L   Potassium 4.4 3.5 - 5.1 mmol/L   Chloride 92 (L) 98 - 111 mmol/L   CO2 34 (H) 22 - 32 mmol/L   Glucose, Bld 411 (H) 70 - 99 mg/dL   BUN 31 (H) 8 - 23 mg/dL   Creatinine, Ser 0.73 0.44 - 1.00 mg/dL   Calcium 9.6 8.9 - 10.3 mg/dL   Total Protein 6.3 (L) 6.5 - 8.1 g/dL   Albumin 3.7 3.5 - 5.0 g/dL   AST 20 15 - 41 U/L   ALT 27 0 - 44 U/L   Alkaline Phosphatase 68 38 - 126 U/L   Total Bilirubin 0.8 0.3 - 1.2 mg/dL   GFR calc non Af Amer >60 >60 mL/min   GFR calc Af Amer >60 >60 mL/min   Anion gap 15 5 - 15  Lipase, blood  Result Value Ref Range   Lipase 51 11 - 51 U/L  Troponin I - Once  Result Value Ref Range   Troponin I 0.03 (HH) <0.03 ng/mL  Lactic acid, plasma  Result Value Ref Range   Lactic Acid, Venous 1.1 0.5 - 1.9 mmol/L  Lactic  acid, plasma  Result Value Ref Range   Lactic Acid, Venous 1.3 0.5 - 1.9 mmol/L  CBC with Differential  Result Value Ref Range   WBC 13.4 (H) 4.0 - 10.5 K/uL   RBC 4.00 3.87 - 5.11 MIL/uL   Hemoglobin 11.8 (L) 12.0 - 15.0 g/dL   HCT 38.6 36.0 - 46.0 %   MCV 96.5 80.0 - 100.0 fL   MCH 29.5 26.0 - 34.0 pg   MCHC 30.6 30.0 - 36.0 g/dL   RDW 13.2 11.5 - 15.5 %   Platelets 276 150 - 400 K/uL   nRBC 0.0 0.0 - 0.2 %   Neutrophils Relative % 90 %   Neutro Abs 11.9 (H) 1.7 - 7.7 K/uL   Lymphocytes Relative 3 %   Lymphs Abs 0.4 (L) 0.7 - 4.0 K/uL   Monocytes Relative 5 %   Monocytes Absolute 0.7 0.1 - 1.0 K/uL   Eosinophils Relative 0 %   Eosinophils Absolute 0.1 0.0 - 0.5 K/uL   Basophils Relative 0 %   Basophils Absolute 0.0 0.0 - 0.1 K/uL   Immature Granulocytes 2 %   Abs Immature Granulocytes 0.23 (H) 0.00 - 0.07 K/uL  Protime-INR  Result Value Ref Range   Prothrombin Time 12.6 11.4 - 15.2 seconds   INR 1.0 0.8 - 1.2  Brain natriuretic peptide  Result Value Ref Range   B Natriuretic Peptide 889.0 (H) 0.0 - 100.0 pg/mL  Blood gas, arterial  Result Value Ref Range   FIO2 36.00    pH, Arterial 7.394 7.350 - 7.450   pCO2 arterial 70.0 (HH) 32.0 - 48.0 mmHg   pO2, Arterial 68.0 (L) 83.0 - 108.0 mmHg   Bicarbonate 37.9 (H) 20.0 - 28.0 mmol/L   Acid-Base Excess 16.0 (H) 0.0 - 2.0 mmol/L   O2 Saturation 91.6 %   Patient temperature 37.0    Allens test (pass/fail) PASS PASS   Ct Abdomen Pelvis Wo Contrast Result Date: 08/20/2018 CLINICAL DATA:  Abdominal bloating and constipation.  Hypoxia. EXAM: CT ABDOMEN AND PELVIS WITHOUT CONTRAST TECHNIQUE: Multidetector CT imaging of the abdomen and pelvis was performed following the standard protocol without IV contrast. COMPARISON:  Abdominal x-rays dated August 13, 2018. Abdominal ultrasound dated February 21, 2016. FINDINGS: Lower chest: No acute abnormality.  Bibasilar atelectasis. Hepatobiliary: No focal liver abnormality is seen. No  gallstones, gallbladder wall thickening, or biliary dilatation. Pancreas: Unremarkable. No pancreatic ductal dilatation or surrounding inflammatory changes. Spleen: Normal in size without focal abnormality. Adrenals/Urinary Tract: The adrenal glands are unremarkable. Small bilateral renal cysts. Punctate nonobstructive right renal calculi. 6 mm nonobstructive left renal calculus. No hydronephrosis. The bladder is unremarkable. Stomach/Bowel: Stomach is within normal limits. Appendix appears normal. No evidence of bowel wall thickening, distention, or inflammatory changes. Diverticulosis of the descending colon. Large amount of stool in the right colon. Vascular/Lymphatic: Aortic atherosclerosis. No enlarged abdominal or pelvic lymph nodes. Reproductive: Uterus and bilateral adnexa are unremarkable. Other: No abdominal wall hernia or abnormality. No abdominopelvic ascites. No pneumoperitoneum. Musculoskeletal: New age-indeterminate moderate L1 compression fracture with 7 mm retropulsion, not present in April. No surrounding paravertebral hematoma. IMPRESSION: 1.  No acute intra-abdominal process. 2. Bilateral nonobstructive nephrolithiasis. 3. New age-indeterminate moderate L1 compression fracture with 7 mm retropulsion, not present in April. Lack of significant surrounding paravertebral hematoma suggests the fracture is chronic. Correlate with point tenderness. 4.  Aortic atherosclerosis (ICD10-I70.0). Electronically Signed   By: Titus Dubin M.D.   On: 08/20/2018 14:12   Dg Chest 2 View Result Date: 08/20/2018 CLINICAL DATA:  Shortness of breath. Recently discharged from the hospital for pneumonia. EXAM: CHEST - 2 VIEW COMPARISON:  Chest x-rays dated August 15, 2018, and June 24, 2010. FINDINGS: Stable cardiomediastinal silhouette with enlargement of the main pulmonary artery. Normal pulmonary vascularity. Emphysematous changes again noted. Similar-appearing bibasilar atelectasis. No focal consolidation, pleural  effusion, or pneumothorax. Age indeterminate severe L1 compression fracture, not present in April. IMPRESSION: 1. No active disease.  COPD and bibasilar atelectasis. 2. Age-indeterminate severe L1 compression fracture, not present in April. Correlate with point tenderness. Electronically Signed   By: Titus Dubin M.D.   On: 08/20/2018 14:05    Eileen [name] was evaluated in Emergency Department on 08/20/2018 for the symptoms described in the history of present illness. She was evaluated in the context of the global COVID-19 pandemic, which necessitated consideration that the patient might be at risk for infection with the SARS-CoV-2 virus that causes COVID-19. Institutional protocols and algorithms that pertain to the evaluation of patients at risk for COVID-19 are in a state of rapid change based on information released by regulatory bodies including the CDC and federal and state organizations. These policies and algorithms were followed during the patient's care in the ED.   1435:  COVID negative. Troponin and BNP per baseline.  CT with new L1 fx. Pt  seems confused regarding events, stating she "just needs to go back to her room." When asked what she means, she cannot tell me. ABG obtained; PaCO2 elevated. Bipap, hour long neb and IV solumedrol given. Concern regarding d/c home again.  T/C returned from [facility] Dr. Manuella Ghazi, case discussed, including:  HPI, pertinent PM/SHx, VS/PE, dx testing, ED course and treatment:  Agreeable to admit.     Final Clinical Impressions(s) / ED Diagnoses   Final diagnoses:  None    ED Discharge Orders    None       Francine Graven, DO 08/23/18 2121

## 2018-08-20 NOTE — ED Triage Notes (Addendum)
Pt was discharged from hospital  Dx pneumonia. Home health nurse found her to be hypoxic 76% on 2 l. EMS reports replaced O2 and increased to 91%. Pt denies cough, just sob. Denies pain. Right side restricted. EMS reports house very hot. O2 sat 64 on room air while in ED. Increased O2 to 3 l . Pt reports she woke up SOB.  ALso reports constipation

## 2018-08-20 NOTE — H&P (Addendum)
History and Physical    Eileen A Woodring UPJ:[id number] DOB: 1946/02/11 DOA: 08/20/2018  PCP: Lavella Lemons, PA   Patient coming from: Home  Chief Complaint: Hypoxemia noted on pulse oximetry, abdominal distention  HPI: Eileen [name] is a 73 y.o. female with medical history significant for type 2 diabetes, chronic atrial flutter on Xarelto, and steroid-dependent COPD with chronic hypoxemia who was just discharged to home with acute COPD exacerbation secondary to pneumonia on 6/8.  She returns today after her home health nurse evaluated her at home and noted pulse oximetry reading of 76%.  EMS had arrived on the scene and noted that her home was very hot and had turned up her nasal cannula and transported her here.  She was noted to have some mild low back pain as well as no bowel movement over the last several days.  She was doing quite well on the day of discharge on 6/8 and was actually recommended to go to SNF on discharge, but patient refused and wanted to go home with home health.  Patient denies any chest tightness, wheezing, cough, fevers, or chills.  She is adamant that she has not had a decent bowel movement since her previous admission and states that she is only had "small bowel movements," however, every time she was asked about bowel movement she reported positive bowel movements.  She denied any low back pain on the time of discharge, but did have an incident in the hospital where there was a questionable fall in the ICU.  She is now complaining of mild low back pain and discomfort.   ED Course: Vital signs are currently stable and patient is on 4 L nasal cannula.  This is usually her baseline home oxygen amount.  ABG with pH of 7.39 and PCO2 of 70 which is at her baseline.  PO2 of 68.  Laboratory data with blood glucose of 411, BNP of 889 which is chronically elevated, and troponin 0.03.  WBC count of 13,400 which is less than where she was on discharge.  Imaging demonstrates what  appears to be a severe L1 compression fracture that is new compared to prior imaging with no paravertebral hematoma, which suggests that this may be chronic.  She is also noted to have significant stool burden in the right colon.  She is also noted to have bibasilar atelectasis.  She currently has no respiratory distress.  Review of Systems: All others reviewed and otherwise negative.  Past Medical History:  Diagnosis Date   Asthma    Atrial flutter ([place])    Cancer ([place])    Right breast   COPD (chronic obstructive pulmonary disease) ([place])    History of breast cancer    right breast   Hypertension    On home O2    Type 2 diabetes mellitus ([place])     Past Surgical History:  Procedure Laterality Date   BREAST SURGERY  2011   Right breast mastectomy   CARDIOVERSION N/A 06/27/2018   Procedure: CARDIOVERSION;  Surgeon: Arnoldo Lenis, MD;  [facility];  Service: Endoscopy;  Laterality: N/A;   CESAREAN SECTION     COLONOSCOPY N/A 01/22/2018   Procedure: COLONOSCOPY;  Surgeon: Rogene Houston, MD;  Location: [facility];  Service: Endoscopy;  Laterality: N/A;   HERNIA REPAIR     RIH   MASTECTOMY  2011   right breast   TEE WITHOUT CARDIOVERSION N/A 06/27/2018   Procedure: TRANSESOPHAGEAL ECHOCARDIOGRAM (TEE) WITH PROPOFOL;  Surgeon: Arnoldo Lenis, MD;  Location: [facility];  Service: Endoscopy;  Laterality: N/A;     reports that she quit smoking about 24 years ago. Her smoking use included cigarettes. She started smoking about 55 years ago. She has a 16.00 pack-year smoking history. She has never used smokeless tobacco. She reports that she does not drink alcohol or use drugs.  Allergies  Allergen Reactions   Codeine Other (See Comments)    "jittery"    Family History  Problem Relation Age of Onset   Cancer Mother        lung   Diabetes Brother     Prior to Admission medications   Medication Sig Start Date End Date Taking?  Authorizing Provider  blood glucose meter kit and supplies KIT Dispense based on patient and insurance preference. Use up to four times daily as directed. (FOR ICD-9 250.00, 250.01).  Check sugar before meals and at night, keep a log. 09/29/16  Yes Arrien, Jimmy Picket, MD  OXYGEN Inhale 2.5-3 L into the lungs daily. At night and daily as needed   Yes [provider]  acetaminophen (TYLENOL) 500 MG tablet Take 500 mg by mouth every 8 (eight) hours as needed for mild pain or headache.     Lavella Lemons, PA  albuterol (PROVENTIL) (2.5 MG/3ML) 0.083% nebulizer solution Take 2.5 mg by nebulization every 6 (six) hours as needed for wheezing or shortness of breath.    [provider]  amiodarone (PACERONE) 200 MG tablet Take 1 tablet (200 mg total) by mouth daily. 07/20/18   Strader, Fransisco Hertz, PA-C  furosemide (LASIX) 40 MG tablet 20 mg daily.  07/10/18   [provider]  guaiFENesin (MUCINEX) 600 MG 12 hr tablet Take 1 tablet (600 mg total) by mouth 2 (two) times daily. 09/21/17   Kathie Dike, MD  meclizine (ANTIVERT) 12.5 MG tablet Take 1 tablet by mouth 3 (three) times daily as needed. 03/21/18   [provider]  metFORMIN (GLUCOPHAGE-XR) 500 MG 24 hr tablet Take 500 mg by mouth daily. 08/10/17   [provider]  metoprolol tartrate (LOPRESSOR) 50 MG tablet Take 50 mg by mouth 2 (two) times daily. 04/12/18   [provider]  Multiple Vitamins-Minerals (EMERGEN-C IMMUNE) PACK Take 1 Package by mouth daily.    [provider]  predniSONE (DELTASONE) 20 MG tablet Take 2 tablets (40 mg total) by mouth daily for 5 days. 08/19/18 08/24/18  Manuella Ghazi, Avantae Bither D, DO  PROAIR HFA 108 (90 BASE) MCG/ACT inhaler Inhale 2 puffs into the lungs every 6 (six) hours as needed for wheezing or shortness of breath.  03/06/11   [provider]  psyllium (HYDROCIL/METAMUCIL) 95 % PACK Take 1 packet by mouth daily. Patient taking differently: Take 1 packet by  mouth daily as needed.  01/24/18   Johnson, Clanford L, MD  rivaroxaban (XARELTO) 20 MG TABS tablet Take 1 tablet (20 mg total) by mouth daily with supper. 01/27/18   Johnson, Clanford L, MD  TRELEGY ELLIPTA 100-62.5-25 MCG/INH AEPB Take 1 puff by mouth daily. 08/27/17   [provider]  Vitamin D, Ergocalciferol, (DRISDOL) 50000 units CAPS capsule Take 50,000 Units every 30 (thirty) days by mouth.    [provider]    Physical Exam: Vitals:   08/20/18 1109 08/20/18 1153 08/20/18 1215 08/20/18 1237  BP: 108/84     Pulse: 83 78 80   Resp:  (!) 26 (!) 21   Temp: 97.8 F (36.6 C)  TempSrc: Oral     SpO2: 94% 93% 91% 95%  Weight:      Height:        Constitutional: NAD, calm, comfortable Vitals:   08/20/18 1109 08/20/18 1153 08/20/18 1215 08/20/18 1237  BP: 108/84     Pulse: 83 78 80   Resp:  (!) 26 (!) 21   Temp: 97.8 F (36.6 C)     TempSrc: Oral     SpO2: 94% 93% 91% 95%  Weight:      Height:       Eyes: lids and conjunctivae normal ENMT: Mucous membranes are moist.  Neck: normal, supple Respiratory: clear to auscultation bilaterally. Normal respiratory effort. No accessory muscle use.  Currently on 4 L nasal cannula with no respiratory distress. Cardiovascular: Regular rate and rhythm, no murmurs. No extremity edema. Abdomen: no tenderness, mild distention noted. Bowel sounds positive.  Musculoskeletal:  No joint deformity upper and lower extremities.   Skin: no rashes, lesions, ulcers.  Psychiatric: Normal judgment and insight. Alert and oriented x 3. Normal mood.   Labs on Admission: I have personally reviewed following labs and imaging studies  CBC: Recent Labs  Lab 08/15/18 0439 08/16/18 0433 08/18/18 0606 08/20/18 1228  WBC 14.8* 13.0* 16.5* 13.4*  NEUTROABS  --   --   --  11.9*  HGB 12.0 12.0 11.8* 11.8*  HCT 40.3 39.7 40.8 38.6  MCV 97.6 96.1 98.3 96.5  PLT 260 249 315 505   Basic Metabolic Panel: Recent Labs  Lab 08/15/18 0439  08/16/18 0433 08/18/18 0606 08/20/18 1228  NA 141 140 141 141  K 4.2 4.4 3.8 4.4  CL 90* 89* 88* 92*  CO2 38* 38* 40* 34*  GLUCOSE 206* 216* 214* 411*  BUN 21 20 31* 31*  CREATININE 0.56 0.57 0.68 0.73  CALCIUM 9.7 9.8 10.0 9.6  MG 2.4  --   --   --    GFR: Estimated Creatinine Clearance: 59.8 mL/min (by C-G formula based on SCr of 0.73 mg/dL). Liver Function Tests: Recent Labs  Lab 08/20/18 1228  AST 20  ALT 27  ALKPHOS 68  BILITOT 0.8  PROT 6.3*  ALBUMIN 3.7   Recent Labs  Lab 08/20/18 1228  LIPASE 51   No results for input(s): AMMONIA in the last 168 hours. Coagulation Profile: Recent Labs  Lab 08/20/18 1228  INR 1.0   Cardiac Enzymes: Recent Labs  Lab 08/20/18 1228  TROPONINI 0.03*   BNP (last 3 results) No results for input(s): PROBNP in the last 8760 hours. HbA1C: No results for input(s): HGBA1C in the last 72 hours. CBG: Recent Labs  Lab 08/18/18 1108 08/18/18 1620 08/18/18 2107 08/19/18 0718 08/19/18 1130  GLUCAP 300* 101* 190* 138* 209*   Lipid Profile: No results for input(s): CHOL, HDL, LDLCALC, TRIG, CHOLHDL, LDLDIRECT in the last 72 hours. Thyroid Function Tests: No results for input(s): TSH, T4TOTAL, FREET4, T3FREE, THYROIDAB in the last 72 hours. Anemia Panel: No results for input(s): VITAMINB12, FOLATE, FERRITIN, TIBC, IRON, RETICCTPCT in the last 72 hours. Urine analysis:    Component Value Date/Time   COLORURINE YELLOW 08/13/2018 1725   APPEARANCEUR CLEAR 08/13/2018 1725   LABSPEC 1.020 08/13/2018 1725   PHURINE 5.0 08/13/2018 1725   GLUCOSEU NEGATIVE 08/13/2018 1725   HGBUR MODERATE (A) 08/13/2018 1725   BILIRUBINUR NEGATIVE 08/13/2018 1725   KETONESUR NEGATIVE 08/13/2018 1725   PROTEINUR NEGATIVE 08/13/2018 1725   UROBILINOGEN 0.2 09/09/2009 1010   NITRITE NEGATIVE 08/13/2018 1725  LEUKOCYTESUR SMALL (A) 08/13/2018 1725    Radiological Exams on Admission: Ct Abdomen Pelvis Wo Contrast  Result Date:  08/20/2018 CLINICAL DATA:  Abdominal bloating and constipation.  Hypoxia. EXAM: CT ABDOMEN AND PELVIS WITHOUT CONTRAST TECHNIQUE: Multidetector CT imaging of the abdomen and pelvis was performed following the standard protocol without IV contrast. COMPARISON:  Abdominal x-rays dated August 13, 2018. Abdominal ultrasound dated February 21, 2016. FINDINGS: Lower chest: No acute abnormality.  Bibasilar atelectasis. Hepatobiliary: No focal liver abnormality is seen. No gallstones, gallbladder wall thickening, or biliary dilatation. Pancreas: Unremarkable. No pancreatic ductal dilatation or surrounding inflammatory changes. Spleen: Normal in size without focal abnormality. Adrenals/Urinary Tract: The adrenal glands are unremarkable. Small bilateral renal cysts. Punctate nonobstructive right renal calculi. 6 mm nonobstructive left renal calculus. No hydronephrosis. The bladder is unremarkable. Stomach/Bowel: Stomach is within normal limits. Appendix appears normal. No evidence of bowel wall thickening, distention, or inflammatory changes. Diverticulosis of the descending colon. Large amount of stool in the right colon. Vascular/Lymphatic: Aortic atherosclerosis. No enlarged abdominal or pelvic lymph nodes. Reproductive: Uterus and bilateral adnexa are unremarkable. Other: No abdominal wall hernia or abnormality. No abdominopelvic ascites. No pneumoperitoneum. Musculoskeletal: New age-indeterminate moderate L1 compression fracture with 7 mm retropulsion, not present in April. No surrounding paravertebral hematoma. IMPRESSION: 1.  No acute intra-abdominal process. 2. Bilateral nonobstructive nephrolithiasis. 3. New age-indeterminate moderate L1 compression fracture with 7 mm retropulsion, not present in April. Lack of significant surrounding paravertebral hematoma suggests the fracture is chronic. Correlate with point tenderness. 4.  Aortic atherosclerosis (ICD10-I70.0). Electronically Signed   By: Titus Dubin M.D.   On:  08/20/2018 14:12   Dg Chest 2 View  Result Date: 08/20/2018 CLINICAL DATA:  Shortness of breath. Recently discharged from the hospital for pneumonia. EXAM: CHEST - 2 VIEW COMPARISON:  Chest x-rays dated August 15, 2018, and June 24, 2010. FINDINGS: Stable cardiomediastinal silhouette with enlargement of the main pulmonary artery. Normal pulmonary vascularity. Emphysematous changes again noted. Similar-appearing bibasilar atelectasis. No focal consolidation, pleural effusion, or pneumothorax. Age indeterminate severe L1 compression fracture, not present in April. IMPRESSION: 1. No active disease.  COPD and bibasilar atelectasis. 2. Age-indeterminate severe L1 compression fracture, not present in April. Correlate with point tenderness. Electronically Signed   By: Titus Dubin M.D.   On: 08/20/2018 14:05    EKG: Independently reviewed. SR 81bpm.  Assessment/Plan Active Problems:   Acute hypoxemic respiratory failure (HCC)    Acute on chronic respiratory failure likely secondary to worsening bibasilar atelectasis atelectasis in the setting of severe COPD -This is likely secondary to L1 compression fracture as well as constipation as patient desaturates with movement; however currently at baseline 4 L nasal cannula -No significant wheezing or other issues noted -We will maintain on some breathing treatments as well as Pulmicort and keep patient on oral prednisone as previously prescribed -Incentive spirometry and up in chair   Type 2 diabetes with hyperglycemia likely steroid-induced -Maintain on home medications as well as aggressive SSI scale while on oral prednisone -Carb modified diet  L1 acute versus chronic compression fracture -We will hold Xarelto for now and place on heparin drip -PT reevaluation and consider need for kyphoplasty based on pain and symptomatology during this hospitalization -Fall precautions  Moderate to severe constipation -We will try suppository -Consider enema  if this fails  History of atrial flutter -Currently rate controlled with no need for telemetry monitoring -Heparin drip and hold Xarelto for now in case kyphoplasty needed   DVT prophylaxis: Heparin  drip Code Status: Full Family Communication: None at bedside; will call Disposition Plan: Admit for hypoxemia evaluation and constipation along with compression fracture; reconsider discharge to SNF as it appears that she may not have a safe home environment Consults called: None Admission status: Inpatient, [facility] Pager [phone]  If 7PM-7AM, please contact night-coverage [email or web address] Password [email or web address]  08/20/2018, 3:03 PM

## 2018-08-20 NOTE — Progress Notes (Addendum)
ANTICOAGULATION CONSULT NOTE - Initial Consult  Pharmacy Consult for heparin infusion Indication: atrial fibrillation  Allergies  Allergen Reactions  . Codeine Other (See Comments)    "jittery"    Patient Measurements: Height: 5\' 2"  (157.5 cm) Weight: 167 lb 8.8 oz (76 kg) IBW/kg (Calculated) : 50.1 Heparin Dosing Weight: HEPARIN DW (KG): 66.6  Vital Signs: Temp: 97.8 F (36.6 C) (06/09 1109) Temp Source: Oral (06/09 1109) BP: 120/77 (06/09 1504) Pulse Rate: 82 (06/09 1504)  Labs: Recent Labs    08/18/18 0606 08/20/18 1228  HGB 11.8* 11.8*  HCT 40.8 38.6  PLT 315 276  LABPROT  --  12.6  INR  --  1.0  CREATININE 0.68 0.73  TROPONINI  --  0.03*    Estimated Creatinine Clearance: 59.8 mL/min (by C-G formula based on SCr of 0.73 mg/dL).   Medical History: Past Medical History:  Diagnosis Date  . Asthma   . Atrial flutter ([place])   . Cancer [facility])    Right breast  . COPD (chronic obstructive pulmonary disease) ([place])   . History of breast cancer    right breast  . Hypertension   . On home O2   . Type 2 diabetes mellitus (HCC)     Medications:  (Not in a hospital admission)   Assessment: Pharmacy consulted to dose heparin infusion for this 73 yo female with chronic atrial flutter.  She was just discharged   home yesterday after admission for COPD exacerbation due to pneumonia.  Patient has been on Xarelto, but it's being held for potential need for kyphoplasty.  Patient states she took her last dose of Xarelto at 0900 this morning.     Goal of Therapy:  Heparin level 0.3-0.7 units/ml Monitor platelets by anticoagulation protocol: Yes   Plan: no bolus  Start heparin infusion at 900 units/hr on 6/10 at 0900 (~24hr after last Xarelto dose) Will titrate using aPTTs until HL correlates Continue to monitor H&H and platelets   Despina Pole, Pharm. D. Clinical Pharmacist 08/20/2018 5:39 PM

## 2018-08-21 ENCOUNTER — Inpatient Hospital Stay (HOSPITAL_COMMUNITY): Payer: Medicare Other

## 2018-08-21 DIAGNOSIS — K5900 Constipation, unspecified: Secondary | ICD-10-CM

## 2018-08-21 DIAGNOSIS — M545 Low back pain: Secondary | ICD-10-CM

## 2018-08-21 DIAGNOSIS — J441 Chronic obstructive pulmonary disease with (acute) exacerbation: Principal | ICD-10-CM

## 2018-08-21 DIAGNOSIS — I4891 Unspecified atrial fibrillation: Secondary | ICD-10-CM

## 2018-08-21 DIAGNOSIS — S32010A Wedge compression fracture of first lumbar vertebra, initial encounter for closed fracture: Secondary | ICD-10-CM

## 2018-08-21 LAB — BASIC METABOLIC PANEL
Anion gap: 13 (ref 5–15)
BUN: 27 mg/dL — ABNORMAL HIGH (ref 8–23)
CO2: 37 mmol/L — ABNORMAL HIGH (ref 22–32)
Calcium: 9.3 mg/dL (ref 8.9–10.3)
Chloride: 92 mmol/L — ABNORMAL LOW (ref 98–111)
Creatinine, Ser: 0.67 mg/dL (ref 0.44–1.00)
GFR calc Af Amer: >60 mL/min (ref >60)
GFR calc non Af Amer: >60 mL/min (ref >60)
Glucose, Bld: 205 mg/dL — ABNORMAL HIGH (ref 70–99)
Potassium: 4.2 mmol/L (ref 3.5–5.1)
Sodium: 142 mmol/L (ref 135–145)

## 2018-08-21 LAB — GLUCOSE, CAPILLARY
Glucose-Capillary: 264 mg/dL — ABNORMAL HIGH (ref 70–99)
Glucose-Capillary: 181 mg/dL — ABNORMAL HIGH (ref 70–99)
Glucose-Capillary: 322 mg/dL — ABNORMAL HIGH (ref 70–99)
Glucose-Capillary: 88 mg/dL (ref 70–99)
Glucose-Capillary: 170 mg/dL — ABNORMAL HIGH (ref 70–99)

## 2018-08-21 LAB — HEPARIN LEVEL (UNFRACTIONATED)
Heparin Unfractionated: 0.13 IU/mL — ABNORMAL LOW (ref 0.30–0.70)
Heparin Unfractionated: 0.33 IU/mL (ref 0.30–0.70)

## 2018-08-21 LAB — MAGNESIUM: Magnesium: 2.3 mg/dL (ref 1.7–2.4)

## 2018-08-21 LAB — CBC
HCT: 36.2 % (ref 36.0–46.0)
Hemoglobin: 11.1 g/dL — ABNORMAL LOW (ref 12.0–15.0)
MCH: 30.2 pg (ref 26.0–34.0)
MCHC: 30.7 g/dL (ref 30.0–36.0)
MCV: 98.4 fL (ref 80.0–100.0)
Platelets: 268 10*3/uL (ref 150–400)
RBC: 3.68 MIL/uL — ABNORMAL LOW (ref 3.87–5.11)
RDW: 13.3 % (ref 11.5–15.5)
WBC: 17 10*3/uL — ABNORMAL HIGH (ref 4.0–10.5)
nRBC: 0 % (ref 0.0–0.2)

## 2018-08-21 LAB — APTT
aPTT: 26 seconds (ref 24–36)
aPTT: 38 seconds — ABNORMAL HIGH (ref 24–36)

## 2018-08-21 MED ORDER — LORAZEPAM 2 MG/ML IJ SOLN
1.0000 mg | Freq: Once | INTRAMUSCULAR | Status: AC
  Administered 2018-08-21: 17:00:00 1 mg via INTRAVENOUS
  Filled 2018-08-21: qty 1

## 2018-08-21 MED ORDER — BISACODYL 10 MG RE SUPP: 10.0000 mg | Freq: Every day | RECTAL | Status: DC | PRN

## 2018-08-21 NOTE — Progress Notes (Signed)
PROGRESS NOTE    Eileen [name]  WVP:[id number] DOB: 10-08-1945 DOA: 08/20/2018 PCP: Lavella Lemons, PA   Brief Narrative:  HPI on Dr. Heath Lark Eileen [name] is a 73 y.o. female with medical history significant for type 2 diabetes, chronic atrial flutter on Xarelto, and steroid-dependent COPD with chronic hypoxemia who was just discharged to home with acute COPD exacerbation secondary to pneumonia on 6/8.  She returns today after her home health nurse evaluated her at home and noted pulse oximetry reading of 76%.  EMS had arrived on the scene and noted that her home was very hot and had turned up her nasal cannula and transported her here.  She was noted to have some mild low back pain as well as no bowel movement over the last several days.  She was doing quite well on the day of discharge on 6/8 and was actually recommended to go to SNF on discharge, but patient refused and wanted to go home with home health.  Patient denies any chest tightness, wheezing, cough, fevers, or chills.  She is adamant that she has not had a decent bowel movement since her previous admission and states that she is only had "small bowel movements," however, every time she was asked about bowel movement she reported positive bowel movements.  She denied any low back pain on the time of discharge, but did have an incident in the hospital where there was a questionable fall in the ICU.  She is now complaining of mild low back pain and discomfort.  Assessment & Plan   Acute on chronic respiratory failure secondary to COPD versus constipation versus compression fracture -Patient was recently discharged on 08/19/2018 after completing a course of antibiotics for pneumonia as well as COPD exacerbation -States she uses 3-3 and 0.5 L of home oxygen and sometimes more if needed at baseline -Chest x-ray unremarkable for infection.  COPD, bibasilar atelectasis. -Currently no significant wheezing on exam -Continue Pulmicort,  oral prednisone, incentive spirometry -Will check oxygen saturations with activity  Diabetes mellitus, type II -Likely steroid-induced -Hold metformin -Continue insulin sliding scale with CBG monitoring  Leukocytosis -WBC up to 17 this morning, possibly secondary to steroids versus fracture -Continue to monitor closely  L1 acute versus chronic compression fracture with back pain -Noted on chest x-ray as well as CT abd/pelvis: New age-indeterminate moderate L1 compression fracture with 7 mm retropulsion (not present in April) -PT consulted -Pending PT evaluation, may obtain MRI to further differentiate acuity -If needed, will consult interventional radiology for possible kyphoplasty -Xarelto has been held, patient currently on heparin drip -Continue pain control- IV toradol, ultram, tylenol   Moderate to severe constipation -Improving, continue suppository as needed  History of atrial flutter -Currently rate controlled -Xarelto held, currently on heparin drip in case of kyphoplasty  DVT Prophylaxis  Heparin  Code Status: Ful  Family Communication: None at bedside  Disposition Plan: Admitted. Pending improvement in respiratory function as well as movement. Suspect home with home health at discharge (SNF was recommended at last admission)  Consultants None  Procedures  None  Antibiotics   Anti-infectives (From admission, onward)   None      Subjective:   Eileen [name] seen and examined today.  Continues to complain of some back pain, more with movement.  Unable to sit up by herself this morning.  Feels breathing has not improved much and will know more once she gets up. Denies current chest pain, abdominal pain, nausea, vomiting, headache, dizziness.  Did have a bowel movement yesterday.  Objective:   Vitals:   08/20/18 2018 08/20/18 2144 08/21/18 0532 08/21/18 0819  BP: 112/68  101/61   Pulse: 86  73   Resp: (!) 28  (!) 24   Temp: 99.4 F (37.4 C)  98.2 F  (36.8 C)   TempSrc: Oral  Oral   SpO2: (!) 82% 91% 98% 93%  Weight: 76.6 kg     Height: 5\' 2"  (1.575 m)      No intake or output data in the 24 hours ending 08/21/18 1021 Filed Weights   08/20/18 1108 08/20/18 2018  Weight: 76 kg 76.6 kg    Exam  General: Well developed, well nourished, NAD, appears stated age  64: NCAT, mucous membranes moist.   Neck: Supple  Cardiovascular: S1 S2 auscultated, RRR, no murmur  Respiratory: Diminished breath sounds, no wheezing on exam  Abdomen: Soft, obese, mildly distended, nontender, + bowel sounds  Extremities: warm dry without cyanosis clubbing or edema  Neuro: AAOx3, nonfocal  Psych: Normal affect and demeanor, pleasant    Data Reviewed: I have personally reviewed following labs and imaging studies  CBC: Recent Labs  Lab 08/15/18 0439 08/16/18 0433 08/18/18 0606 08/20/18 1228 08/21/18 0502  WBC 14.8* 13.0* 16.5* 13.4* 17.0*  NEUTROABS  --   --   --  11.9*  --   HGB 12.0 12.0 11.8* 11.8* 11.1*  HCT 40.3 39.7 40.8 38.6 36.2  MCV 97.6 96.1 98.3 96.5 98.4  PLT 260 249 315 276 502   Basic Metabolic Panel: Recent Labs  Lab 08/15/18 0439 08/16/18 0433 08/18/18 0606 08/20/18 1228 08/21/18 0502  NA 141 140 141 141 142  K 4.2 4.4 3.8 4.4 4.2  CL 90* 89* 88* 92* 92*  CO2 38* 38* 40* 34* 37*  GLUCOSE 206* 216* 214* 411* 205*  BUN 21 20 31* 31* 27*  CREATININE 0.56 0.57 0.68 0.73 0.67  CALCIUM 9.7 9.8 10.0 9.6 9.3  MG 2.4  --   --   --  2.3   GFR: Estimated Creatinine Clearance: 60 mL/min (by C-G formula based on SCr of 0.67 mg/dL). Liver Function Tests: Recent Labs  Lab 08/20/18 1228  AST 20  ALT 27  ALKPHOS 68  BILITOT 0.8  PROT 6.3*  ALBUMIN 3.7   Recent Labs  Lab 08/20/18 1228  LIPASE 51   No results for input(s): AMMONIA in the last 168 hours. Coagulation Profile: Recent Labs  Lab 08/20/18 1228  INR 1.0   Cardiac Enzymes: Recent Labs  Lab 08/20/18 1228  TROPONINI 0.03*   BNP (last 3  results) No results for input(s): PROBNP in the last 8760 hours. HbA1C: No results for input(s): HGBA1C in the last 72 hours. CBG: Recent Labs  Lab 08/19/18 0718 08/19/18 1130 08/20/18 1738 08/20/18 2213 08/21/18 0818  GLUCAP 138* 209* 263* 316* 181*   Lipid Profile: No results for input(s): CHOL, HDL, LDLCALC, TRIG, CHOLHDL, LDLDIRECT in the last 72 hours. Thyroid Function Tests: No results for input(s): TSH, T4TOTAL, FREET4, T3FREE, THYROIDAB in the last 72 hours. Anemia Panel: No results for input(s): VITAMINB12, FOLATE, FERRITIN, TIBC, IRON, RETICCTPCT in the last 72 hours. Urine analysis:    Component Value Date/Time   COLORURINE YELLOW 08/20/2018 1750   APPEARANCEUR CLEAR 08/20/2018 1750   LABSPEC 1.030 08/20/2018 1750   PHURINE 5.0 08/20/2018 1750   GLUCOSEU >=500 (A) 08/20/2018 1750   HGBUR SMALL (A) 08/20/2018 1750   BILIRUBINUR NEGATIVE 08/20/2018 1750   KETONESUR 5 (  A) 08/20/2018 1750   PROTEINUR NEGATIVE 08/20/2018 1750   UROBILINOGEN 0.2 09/09/2009 1010   NITRITE NEGATIVE 08/20/2018 1750   LEUKOCYTESUR NEGATIVE 08/20/2018 1750   Sepsis Labs: @LABRCNTIP (procalcitonin:4,lacticidven:4)  ) Recent Results (from the past 240 hour(s))  Culture, Urine     Status: None   Collection Time: 08/13/18  5:25 PM  Result Value Ref Range Status   Specimen Description   Final    URINE, RANDOM Performed at [facility], [address] [place], [place] [address]    Special Requests   Final    NONE Performed at [facility], [address] [place], [place] [address]    Culture   Final    NO GROWTH Performed at [facility], [place] [address], [place],  [address]    Report Status 08/15/2018 FINAL  Final  SARS Coronavirus 2 (CEPHEID - Performed in [place] hospital lab), Hosp Order     Status: None   Collection Time: 08/20/18  1:09 PM  Result Value Ref Range Status   SARS Coronavirus 2 NEGATIVE NEGATIVE Final    Comment: (NOTE) If result is NEGATIVE  SARS-CoV-2 target nucleic acids are NOT DETECTED. The SARS-CoV-2 RNA is generally detectable in upper and lower  respiratory specimens during the acute phase of infection. The lowest  concentration of SARS-CoV-2 viral copies this assay can detect is 250  copies / mL. A negative result does not preclude SARS-CoV-2 infection  and should not be used as the sole basis for treatment or other  patient management decisions.  A negative result may occur with  improper specimen collection / handling, submission of specimen other  than nasopharyngeal swab, presence of viral mutation(s) within the  areas targeted by this assay, and inadequate number of viral copies  (<250 copies / mL). A negative result must be combined with clinical  observations, patient history, and epidemiological information. If result is POSITIVE SARS-CoV-2 target nucleic acids are DETECTED. The SARS-CoV-2 RNA is generally detectable in upper and lower  respiratory specimens dur ing the acute phase of infection.  Positive  results are indicative of active infection with SARS-CoV-2.  Clinical  correlation with patient history and other diagnostic information is  necessary to determine patient infection status.  Positive results do  not rule out bacterial infection or co-infection with other viruses. If result is PRESUMPTIVE POSTIVE SARS-CoV-2 nucleic acids MAY BE PRESENT.   A presumptive positive result was obtained on the submitted specimen  and confirmed on repeat testing.  While 2019 novel coronavirus  (SARS-CoV-2) nucleic acids may be present in the submitted sample  additional confirmatory testing may be necessary for epidemiological  and / or clinical management purposes  to differentiate between  SARS-CoV-2 and other Sarbecovirus currently known to infect humans.  If clinically indicated additional testing with an alternate test  methodology [phone]) is advised. The SARS-CoV-2 RNA is generally  detectable in upper  and lower respiratory sp ecimens during the acute  phase of infection. The expected result is Negative. Fact Sheet for Patients:  StrictlyIdeas.no Fact Sheet for Healthcare Providers: BankingDealers.co.za This test is not yet approved or cleared by the [place] FDA and has been authorized for detection and/or diagnosis of SARS-CoV-2 by FDA under an Emergency Use Authorization (EUA).  This EUA will remain in effect (meaning this test can be used) for the duration of the COVID-19 declaration under Section 564(b)(1) of the Act, 21 U.S.C. section 360bbb-3(b)(1), unless the authorization is terminated or revoked sooner. Performed at  [facility], [address], [place], [place] [address]       Radiology Studies: Ct Abdomen Pelvis Wo Contrast  Result Date: 08/20/2018 CLINICAL DATA:  Abdominal bloating and constipation.  Hypoxia. EXAM: CT ABDOMEN AND PELVIS WITHOUT CONTRAST TECHNIQUE: Multidetector CT imaging of the abdomen and pelvis was performed following the standard protocol without IV contrast. COMPARISON:  Abdominal x-rays dated August 13, 2018. Abdominal ultrasound dated February 21, 2016. FINDINGS: Lower chest: No acute abnormality.  Bibasilar atelectasis. Hepatobiliary: No focal liver abnormality is seen. No gallstones, gallbladder wall thickening, or biliary dilatation. Pancreas: Unremarkable. No pancreatic ductal dilatation or surrounding inflammatory changes. Spleen: Normal in size without focal abnormality. Adrenals/Urinary Tract: The adrenal glands are unremarkable. Small bilateral renal cysts. Punctate nonobstructive right renal calculi. 6 mm nonobstructive left renal calculus. No hydronephrosis. The bladder is unremarkable. Stomach/Bowel: Stomach is within normal limits. Appendix appears normal. No evidence of bowel wall thickening, distention, or inflammatory changes. Diverticulosis of the descending colon. Large amount of stool in the  right colon. Vascular/Lymphatic: Aortic atherosclerosis. No enlarged abdominal or pelvic lymph nodes. Reproductive: Uterus and bilateral adnexa are unremarkable. Other: No abdominal wall hernia or abnormality. No abdominopelvic ascites. No pneumoperitoneum. Musculoskeletal: New age-indeterminate moderate L1 compression fracture with 7 mm retropulsion, not present in April. No surrounding paravertebral hematoma. IMPRESSION: 1.  No acute intra-abdominal process. 2. Bilateral nonobstructive nephrolithiasis. 3. New age-indeterminate moderate L1 compression fracture with 7 mm retropulsion, not present in April. Lack of significant surrounding paravertebral hematoma suggests the fracture is chronic. Correlate with point tenderness. 4.  Aortic atherosclerosis (ICD10-I70.0). Electronically Signed   By: Titus Dubin M.D.   On: 08/20/2018 14:12   Dg Chest 2 View  Result Date: 08/20/2018 CLINICAL DATA:  Shortness of breath. Recently discharged from the hospital for pneumonia. EXAM: CHEST - 2 VIEW COMPARISON:  Chest x-rays dated August 15, 2018, and June 24, 2010. FINDINGS: Stable cardiomediastinal silhouette with enlargement of the main pulmonary artery. Normal pulmonary vascularity. Emphysematous changes again noted. Similar-appearing bibasilar atelectasis. No focal consolidation, pleural effusion, or pneumothorax. Age indeterminate severe L1 compression fracture, not present in April. IMPRESSION: 1. No active disease.  COPD and bibasilar atelectasis. 2. Age-indeterminate severe L1 compression fracture, not present in April. Correlate with point tenderness. Electronically Signed   By: Titus Dubin M.D.   On: 08/20/2018 14:05     Scheduled Meds: . amiodarone  200 mg Oral Daily  . budesonide (PULMICORT) nebulizer solution  0.25 mg Nebulization BID  . furosemide  20 mg Oral Daily  . guaiFENesin  600 mg Oral BID  . insulin aspart  0-20 Units Subcutaneous TID WC  . insulin aspart  0-5 Units Subcutaneous QHS  .  insulin aspart  3 Units Subcutaneous TID WC  . metoprolol tartrate  50 mg Oral BID  . predniSONE  40 mg Oral Daily  . sodium chloride flush  3 mL Intravenous Q12H   Continuous Infusions: . sodium chloride    . heparin 900 Units/hr (08/21/18 1014)     LOS: 1 day   Time Spent in minutes   30 minutes  Prayan Ulin D.O. on 08/21/2018 at 10:21 AM  Between 7am to 7pm - Please see pager noted on [email or web address]  After 7pm go to [email or web address]  And look for the night coverage person covering for me after hours  [facility] Office  [phone]

## 2018-08-21 NOTE — Plan of Care (Signed)
  Problem: Acute Rehab PT Goals(only PT should resolve) Goal: Pt Will Go Supine/Side To Sit Outcome: Progressing Flowsheets (Taken 08/21/2018 1421) Pt will go Supine/Side to Sit: with modified independence Goal: Patient Will Transfer Sit To/From Stand Outcome: Progressing Flowsheets (Taken 08/21/2018 1421) Patient will transfer sit to/from stand: with supervision Goal: Pt Will Transfer Bed To Chair/Chair To Bed Outcome: Progressing Flowsheets (Taken 08/21/2018 1421) Pt will Transfer Bed to Chair/Chair to Bed: with supervision Goal: Pt Will Ambulate Outcome: Progressing Flowsheets (Taken 08/21/2018 1421) Pt will Ambulate: 50 feet; with min guard assist; with rolling walker   2:21 PM, 08/21/18 Lonell Grandchild, MPT Physical Therapist with [facility] [phone] office [phone] mobile phone

## 2018-08-21 NOTE — Progress Notes (Signed)
ANTICOAGULATION CONSULT NOTE - follow up [place] [facility] for heparin infusion Indication: atrial fibrillation  Allergies  Allergen Reactions  . Codeine Other (See Comments)    "jittery"    Patient Measurements: Height: 5' 2" (157.5 cm) Weight: 168 lb 14 oz (76.6 kg) IBW/kg (Calculated) : 50.1 Heparin Dosing Weight: HEPARIN DW (KG): 66.8  Vital Signs: Temp: 98.4 F (36.9 C) (06/10 1551) Temp Source: Oral (06/10 1551) BP: 93/62 (06/10 1551) Pulse Rate: 70 (06/10 1551)  Labs: Recent Labs    08/20/18 1228 08/20/18 1639 08/21/18 0502 08/21/18 1751  HGB 11.8*  --  11.1*  --   HCT 38.6  --  36.2  --   PLT 276  --  268  --   APTT <20*  --  26 38*  LABPROT 12.6  --   --   --   INR 1.0  --   --   --   HEPARINUNFRC  --  0.23* 0.13* 0.33  CREATININE 0.73  --  0.67  --   TROPONINI 0.03*  --   --   --     Estimated Creatinine Clearance: 60 mL/min (by C-G formula based on SCr of 0.67 mg/dL).   Medical History: Past Medical History:  Diagnosis Date  . Asthma   . Atrial flutter ([place])   . Cancer [facility])    Right breast  . COPD (chronic obstructive pulmonary disease) ([place])   . History of breast cancer    right breast  . Hypertension   . On home O2   . Type 2 diabetes mellitus ([facility])     Medications:  Medications Prior to Admission  Medication Sig Dispense Refill Last Dose  . acetaminophen (TYLENOL) 500 MG tablet Take 500 mg by mouth every 8 (eight) hours as needed for mild pain or headache.    08/20/2018 at Unknown time  . albuterol (PROVENTIL) (2.5 MG/3ML) 0.083% nebulizer solution Take 2.5 mg by nebulization every 6 (six) hours as needed for wheezing or shortness of breath.   unknown  . amiodarone (PACERONE) 200 MG tablet Take 1 tablet (200 mg total) by mouth daily. 90 tablet 3 08/20/2018 at Unknown time  . blood glucose meter kit and supplies KIT Dispense based on patient and insurance preference. Use up to four times daily as directed. (FOR ICD-9 250.00,  250.01).  Check sugar before meals and at night, keep a log. 1 each 0 unkown  . furosemide (LASIX) 40 MG tablet Take 20 mg by mouth daily.    08/20/2018 at Unknown time  . metFORMIN (GLUCOPHAGE-XR) 500 MG 24 hr tablet Take 500 mg by mouth daily.  1 08/20/2018 at Unknown time  . metoprolol tartrate (LOPRESSOR) 50 MG tablet Take 50 mg by mouth 2 (two) times daily.   08/20/2018 at 900  . Multiple Vitamins-Minerals (EMERGEN-C IMMUNE) PACK Take 1 Package by mouth daily.   08/20/2018 at Unknown time  . OXYGEN Inhale 2.5-3 L into the lungs daily. At night and daily as needed   08/19/2018 at Unknown time  . predniSONE (DELTASONE) 20 MG tablet Take 2 tablets (40 mg total) by mouth daily for 5 days. 10 tablet 0 08/20/2018 at Unknown time  . PROAIR HFA 108 (90 BASE) MCG/ACT inhaler Inhale 2 puffs into the lungs every 6 (six) hours as needed for wheezing or shortness of breath.    unknown  . psyllium (HYDROCIL/METAMUCIL) 95 % PACK Take 1 packet by mouth daily. (Patient taking differently: Take 1 packet by mouth daily as  needed. ) 240 each 0 unknown  . rivaroxaban (XARELTO) 20 MG TABS tablet Take 1 tablet (20 mg total) by mouth daily with supper. (Patient taking differently: Take 20 mg by mouth every morning. ) 21 tablet 0 08/20/2018 at 900  . TRELEGY ELLIPTA 100-62.5-25 MCG/INH AEPB Take 1 puff by mouth daily.  0 08/20/2018 at Unknown time    Assessment: Pharmacy consulted to dose heparin infusion for this 73 yo female with chronic atrial flutter.  She was just discharged   home yesterday after admission for COPD exacerbation due to pneumonia.  Patient has been on Xarelto, but it's being held for potential need for kyphoplasty.  Patient states she took her last dose of Xarelto at 0900 on 6-9.  APTT is subtherapeutic, will adjust.  Goal of Therapy:  APTT 66-102 Heparin level 0.3-0.7 units/ml Monitor platelets by anticoagulation protocol: Yes   Plan:    Increase Heparin infusion to 1050 units/hr  APTT and heparin level  in ~8 hours Continue to monitor H&H and platelets   , BS Pharm D, BCPS Clinical Pharmacist Pager #[phone] 08/21/2018 6:55 PM   

## 2018-08-21 NOTE — Plan of Care (Signed)

## 2018-08-21 NOTE — Evaluation (Signed)
Physical Therapy Evaluation Patient Details Name: Eileen [name] MRN: [id number] DOB: [birth year] Today's Date: 08/21/2018   History of Present Illness   Eileen [name] is a 73 y.o. female with medical history significant for type 2 diabetes, chronic atrial flutter on Xarelto, and steroid-dependent COPD with chronic hypoxemia who was just discharged to home with acute COPD exacerbation secondary to pneumonia on 6/8.  She returns today after her home health nurse evaluated her at home and noted pulse oximetry reading of 76%.  EMS had arrived on the scene and noted that her home was very hot and had turned up her nasal cannula and transported her here.  She was noted to have some mild low back pain as well as no bowel movement over the last several days.  She was doing quite well on the day of discharge on 6/8 and was actually recommended to go to SNF on discharge, but patient refused and wanted to go home with home health.  Patient denies any chest tightness, wheezing, cough, fevers, or chills.  She is adamant that she has not had a decent bowel movement since her previous admission and states that she is only had "small bowel movements," however, every time she was asked about bowel movement she reported positive bowel movements.  She denied any low back pain on the time of discharge, but did have an incident in the hospital where there was a questionable fall in the ICU.  She is now complaining of mild low back pain and discomfort.    Clinical Impression  Patient presents seated at bedside while on 3.5 LPM.  Patient demonstrates slow labored unsteady cadence during ambulation, limited mostly due to c/o fatigue and SOB, no loss of balance, but required much time to complete turns, on 4 LPM with SpO2 dropping from 93% to 86-88%.  Patient continued sitting up at bedside after therapy.  Patient will benefit from continued physical therapy in hospital and recommended venue below to increase strength,  balance, endurance for safe ADLs and gait.    Follow Up Recommendations SNF;Supervision for mobility/OOB;Supervision/Assistance - 24 hour    Equipment Recommendations  Other (comment)(4 wheeled walker with brakes and seat (Rollator))    Recommendations for Other Services       Precautions / Restrictions Precautions Precautions: Fall Restrictions Weight Bearing Restrictions: No      Mobility  Bed Mobility Overal bed mobility: Needs Assistance Bed Mobility: Supine to Sit;Sit to Supine     Supine to sit: Supervision Sit to supine: Supervision   General bed mobility comments: increased time  Transfers Overall transfer level: Needs assistance Equipment used: Rolling walker (2 wheeled) Transfers: Sit to/from [facility] Sit to Stand: Min assist Stand pivot transfers: Min assist       General transfer comment: increased time, labored movement  Ambulation/Gait Ambulation/Gait assistance: Min assist Gait Distance (Feet): 25 Feet Assistive device: Rolling walker (2 wheeled) Gait Pattern/deviations: Decreased step length - right;Decreased step length - left;Decreased stride length Gait velocity: slow   General Gait Details: slow labored unsteady cadence while on 4 LPM with SpO2 dropping from 91% to 87%, limited secondary to c/o fatigue amd SOB  Stairs            Wheelchair Mobility    Modified Rankin (Stroke Patients Only)       Balance Overall balance assessment: Needs assistance Sitting-balance support: Feet supported;No upper extremity supported Sitting balance-Leahy Scale: Good Sitting balance - Comments: seated at bedside   Standing balance support:  During functional activity;Bilateral upper extremity supported Standing balance-Leahy Scale: Fair Standing balance comment: using RW                             Pertinent Vitals/Pain Pain Assessment: No/denies pain    Home Living Family/patient expects to be discharged  to:: Private residence Living Arrangements: Children;Other relatives Available Help at Discharge: Family Type of Home: House Home Access: Stairs to enter Entrance Stairs-Rails: Right;Left;Can reach both Technical brewer of Steps: 3 Home Layout: One level Home Equipment: None      Prior Function Level of Independence: Independent         Comments: Hydrographic surveyor, drives     Hand Dominance   Dominant Hand: Right    Extremity/Trunk Assessment   Upper Extremity Assessment Upper Extremity Assessment: Generalized weakness    Lower Extremity Assessment Lower Extremity Assessment: Generalized weakness    Cervical / Trunk Assessment Cervical / Trunk Assessment: Normal  Communication   Communication: No difficulties  Cognition Arousal/Alertness: Awake/alert Behavior During Therapy: WFL for tasks assessed/performed Overall Cognitive Status: Within Functional Limits for tasks assessed                                        General Comments      Exercises     Assessment/Plan    PT Assessment Patient needs continued PT services  PT Problem List Decreased strength;Decreased activity tolerance;Decreased mobility;Decreased balance       PT Treatment Interventions Therapeutic exercise;Gait training;Stair training;Functional mobility training;Therapeutic activities;Patient/family education;DME instruction    PT Goals (Current goals can be found in the Care Plan section)  Acute Rehab PT Goals Patient Stated Goal: return home after rehab PT Goal Formulation: With patient Time For Goal Achievement: 09/04/18 Potential to Achieve Goals: Good    Frequency Min 3X/week   Barriers to discharge        Co-evaluation               AM-PAC PT "6 Clicks" Mobility  Outcome Measure Help needed turning from your back to your side while in a flat bed without using bedrails?: None Help needed moving from lying on your back to sitting on the side  of a flat bed without using bedrails?: None Help needed moving to and from a bed to a chair (including a wheelchair)?: A Little Help needed standing up from a chair using your arms (e.g., wheelchair or bedside chair)?: A Little Help needed to walk in hospital room?: A Lot Help needed climbing 3-5 steps with a railing? : A Lot 6 Click Score: 18    End of Session Equipment Utilized During Treatment: Oxygen Activity Tolerance: Patient tolerated treatment well;Patient limited by fatigue Patient left: in bed;with call bell/phone within reach(seated at bedside) Nurse Communication: Mobility status PT Visit Diagnosis: Unsteadiness on feet (R26.81);Other abnormalities of gait and mobility (R26.89);Muscle weakness (generalized) (M62.81)    Time: 9357-0177 PT Time Calculation (min) (ACUTE ONLY): 22 min   Charges:   PT Evaluation $PT Eval Moderate Complexity: 1 Mod PT Treatments $Therapeutic Activity: 8-22 mins        2:19 PM, 08/21/18 Lonell Grandchild, MPT Physical Therapist with [facility] [phone] office [phone] mobile phone

## 2018-08-21 NOTE — Progress Notes (Addendum)
Inpatient Diabetes Program Recommendations  AACE/ADA: New Consensus Statement on Inpatient Glycemic Control (2015)  Target Ranges:  Prepandial:   less than 140 mg/dL      Peak postprandial:   less than 180 mg/dL (1-2 hours)      Critically ill patients:  140 - 180 mg/dL   Lab Results  Component Value Date   GLUCAP 322 (H) 08/21/2018   HGBA1C 8.6 (H) 06/24/2018    Review of Glycemic Control Results for [place], [name] (MRN [id number]) as of 08/21/2018 14:31  Ref. Range 08/20/2018 17:38 08/20/2018 22:13 08/21/2018 08:18 08/21/2018 12:10  Glucose-Capillary Latest Ref Range: 70 - 99 mg/dL 263 (H) 316 (H) 181 (H) 322 (H)   Diabetes history: DM2 Outpatient Diabetes medications: metformin 500 mg QD Current orders for Inpatient glycemic control: Novolog 3 units TID, Novolog 0-20 units TID, Novolog 0-5 units QHS Prednisone 40 mg QD  Inpatient Diabetes Program Recommendations:     Consider increasing meal coverage to Novolog 6 units TID (assuming patient is consuming >50% of meal).   Thanks, Bronson Curb, MSN, RNC-OB Diabetes Coordinator [phone] (8a-5p)

## 2018-08-21 NOTE — TOC Initial Note (Signed)
Transition of Care [facility]) - Initial/Assessment Note    Patient Details  Name: Eileen [name] MRN: [id number] Date of Birth: Jun 09, 1945  Transition of [facility]) CM/SW Contact:    Shade Flood, LCSW Phone Number: 08/21/2018, 11:22 AM  Clinical Narrative:                  Pt discharged from [facility] Monday this week. She went home with [facility] RN, PT and a walker. [facility] RN sent pt to ED yesterday due to low O2 saturation. MD notes indicate pt has a compression fracture and may need a Kyphoplasty at some point.   Met with pt to assess. Pt alert and oriented x4. She is very pleasant. Offered SNF rehab at dc but pt states she wants to go home with [facility] again. Pt also asking if she can trade out the walker she got at [employer] for a rollator. [place] to inquire and was informed that if we provide an order for a rollator, they will trade out the walker they gave pt for the rollator. Updated pt and her daughter.   Will follow and assist with TOC needs.  Expected Discharge Plan: [place] Barriers to Discharge: Continued Medical Work up   Patient Goals and CMS Choice        Expected Discharge Plan and Services Expected Discharge Plan: [place] Agency: [place] ([place])        Prior Living Arrangements/Services     Patient language and need for interpreter reviewed:: Yes Do you feel safe going back to the place where you live?: Yes      Need for Family Participation in Patient Care: Yes (Comment) Care giver support system in place?: Yes (comment) Current home services: Home PT, Home RN, DME Criminal Activity/Legal Involvement Pertinent to Current Situation/Hospitalization: No - Comment as needed  Activities of Daily Living Home Assistive Devices/Equipment: CBG Meter, Walker (specify type) ADL Screening (condition at time of admission) Patient's cognitive ability  adequate to safely complete daily activities?: No Is the patient deaf or have difficulty hearing?: No Does the patient have difficulty seeing, even when wearing glasses/contacts?: No Does the patient have difficulty concentrating, remembering, or making decisions?: No Patient able to express need for assistance with ADLs?: No Does the patient have difficulty dressing or bathing?: No Independently performs ADLs?: Yes (appropriate for developmental age) Does the patient have difficulty walking or climbing stairs?: No Weakness of Legs: Both Weakness of Arms/Hands: None  Permission Sought/Granted                  Emotional Assessment Appearance:: Appears stated age Attitude/Demeanor/Rapport: Engaged Affect (typically observed): Calm, Pleasant Orientation: : Oriented to Self, Oriented to Place, Oriented to  Time, Oriented to Situation Alcohol / Substance Use: Not Applicable Psych Involvement: No (comment)  Admission diagnosis:  Confusion [R41.0] COPD with acute exacerbation ([place]) [J44.1] Closed fracture of first lumbar vertebra, unspecified fracture morphology, initial encounter ([place]) [S32.019A] Patient Active Problem List   Diagnosis Date Noted  . Acute and chronic respiratory failure (acute-on-chronic) ([place]) 08/10/2018  . Left lower lobe pneumonia ([place]) 08/10/2018  . Leukocytosis 08/10/2018  . Chronic respiratory failure with hypoxia ([place]) 06/28/2018  . Acute upper GI bleeding 01/20/2018  .  Lower GI bleed 01/20/2018  . Acute respiratory failure with hypoxia ([place]) 09/17/2017  . Diabetes mellitus ([place]) 08/31/2017  . Acute hypoxemic respiratory failure ([place]) 08/31/2017  . Atrial fibrillation with RVR ([place]) 09/24/2016  . Atrial flutter ([place]) 09/23/2016  . Obstructive chronic bronchitis with exacerbation ([place]) 05/23/2016  . Acute on chronic respiratory failure ([place]) 03/24/2015  . Metabolic encephalopathy 71/59/5396  . HCAP (healthcare-associated pneumonia) 03/24/2015  .  Essential hypertension   . Anticoagulation management encounter   . New onset atrial flutter ([place]) 03/21/2015  . Obesity 03/21/2015  . Atrial flutter with rapid ventricular response ([place]) 03/21/2015  . COPD (chronic obstructive pulmonary disease) ([place]) 12/20/2014  . COPD exacerbation ([place]) 12/20/2014  . 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

## 2018-08-21 NOTE — Progress Notes (Signed)
ANTICOAGULATION CONSULT NOTE - Initial Consult  Pharmacy Consult for heparin infusion Indication: atrial fibrillation  Allergies  Allergen Reactions  . Codeine Other (See Comments)    "jittery"    Patient Measurements: Height: _0  (157.5 cm) Weight: 168 lb 14 oz (76.6 kg) IBW/kg (Calculated) : 50.1 Heparin Dosing Weight: HEPARIN DW (KG): 66.8  Vital Signs: Temp: 98.2 F (36.8 C) (06/10 0532) Temp Source: Oral (06/10 0532) BP: 101/61 (06/10 0532) Pulse Rate: 73 (06/10 0532)  Labs: Recent Labs    08/20/18 1228 08/20/18 1639 08/21/18 0502  HGB 11.8*  --  11.1*  HCT 38.6  --  36.2  PLT 276  --  268  APTT <20*  --  26  LABPROT 12.6  --   --   INR 1.0  --   --   HEPARINUNFRC  --  0.23* 0.13*  CREATININE 0.73  --  0.67  TROPONINI 0.03*  --   --     Estimated Creatinine Clearance: 60 mL/min (by C-G formula based on SCr of 0.67 mg/dL).   Medical History: Past Medical History:  Diagnosis Date  . Asthma   . Atrial flutter ([place])   . Cancer [facility])    Right breast  . COPD (chronic obstructive pulmonary disease) ([place])   . History of breast cancer    right breast  . Hypertension   . On home O2   . Type 2 diabetes mellitus (H[facility])     Medications:  Medications Prior to Admission  Medication Sig Dispense Refill Last Dose  . acetaminophen (TYLENOL) 500 MG tablet Take 500 mg by mouth every 8 (eight) hours as needed for mild pain or headache.    08/20/2018 at Unknown time  . albuterol (PROVENTIL) (2.5 MG/3ML) 0.083% nebulizer solution Take 2.5 mg by nebulization every 6 (six) hours as needed for wheezing or shortness of breath.   unknown  . amiodarone (PACERONE) 200 MG tablet Take 1 tablet (200 mg total) by mouth daily. 90 tablet 3 08/20/2018 at Unknown time  . blood glucose meter kit and supplies KIT Dispense based on patient and insurance preference. Use up to four times daily as directed. (FOR ICD-9 250.00, 250.01).  Check sugar before meals and at night, keep a log. 1  each 0 unkown  . furosemide (LASIX) 40 MG tablet Take 20 mg by mouth daily.    08/20/2018 at Unknown time  . metFORMIN (GLUCOPHAGE-XR) 500 MG 24 hr tablet Take 500 mg by mouth daily.  1 08/20/2018 at Unknown time  . metoprolol tartrate (LOPRESSOR) 50 MG tablet Take 50 mg by mouth 2 (two) times daily.   08/20/2018 at 900  . Multiple Vitamins-Minerals (EMERGEN-C IMMUNE) PACK Take 1 Package by mouth daily.   08/20/2018 at Unknown time  . OXYGEN Inhale 2.5-3 L into the lungs daily. At night and daily as needed   08/19/2018 at Unknown time  . predniSONE (DELTASONE) 20 MG tablet Take 2 tablets (40 mg total) by mouth daily for 5 days. 10 tablet 0 08/20/2018 at Unknown time  . PROAIR HFA 108 (90 BASE) MCG/ACT inhaler Inhale 2 puffs into the lungs every 6 (six) hours as needed for wheezing or shortness of breath.    unknown  . psyllium (HYDROCIL/METAMUCIL) 95 % PACK Take 1 packet by mouth daily. (Patient taking differently: Take 1 packet by mouth daily as needed. ) 240 each 0 unknown  . rivaroxaban (XARELTO) 20 MG TABS tablet Take 1 tablet (20 mg total) by mouth daily with supper. (Patient  taking differently: Take 20 mg by mouth every morning. ) 21 tablet 0 08/20/2018 at 900  . TRELEGY ELLIPTA 100-62.5-25 MCG/INH AEPB Take 1 puff by mouth daily.  0 08/20/2018 at Unknown time    Assessment: Pharmacy consulted to dose heparin infusion for this 73 yo female with chronic atrial flutter.  She was just discharged   home yesterday after admission for COPD exacerbation due to pneumonia.  Patient has been on Xarelto, but it's being held for potential need for kyphoplasty.  Patient states she took her last dose of Xarelto at 0900 on 6-9.    Goal of Therapy:  Heparin level 0.3-0.7 units/ml Monitor platelets by anticoagulation protocol: Yes   Plan:    Heparin infusion started  at 900 units/hr today at 1010 (~24hr after last Xarelto dose) Obtain  APTT at 1800 today Continue to monitor H&H and platelets   Despina Pole, Pharm.  D. Clinical Pharmacist 08/21/2018 10:28 AM

## 2018-08-22 DIAGNOSIS — K5900 Constipation, unspecified: Secondary | ICD-10-CM

## 2018-08-22 DIAGNOSIS — J9621 Acute and chronic respiratory failure with hypoxia: Secondary | ICD-10-CM

## 2018-08-22 DIAGNOSIS — S32010A Wedge compression fracture of first lumbar vertebra, initial encounter for closed fracture: Secondary | ICD-10-CM

## 2018-08-22 LAB — CBC
HCT: 41.5 % (ref 36.0–46.0)
Hemoglobin: 12.2 g/dL (ref 12.0–15.0)
MCH: 29.3 pg (ref 26.0–34.0)
MCHC: 29.4 g/dL — ABNORMAL LOW (ref 30.0–36.0)
MCV: 99.8 fL (ref 80.0–100.0)
Platelets: 298 10*3/uL (ref 150–400)
RBC: 4.16 MIL/uL (ref 3.87–5.11)
RDW: 13.2 % (ref 11.5–15.5)
WBC: 18.1 10*3/uL — ABNORMAL HIGH (ref 4.0–10.5)
nRBC: 0.2 % (ref 0.0–0.2)

## 2018-08-22 LAB — BASIC METABOLIC PANEL
Anion gap: 16 — ABNORMAL HIGH (ref 5–15)
BUN: 25 mg/dL — ABNORMAL HIGH (ref 8–23)
CO2: 35 mmol/L — ABNORMAL HIGH (ref 22–32)
Calcium: 9.6 mg/dL (ref 8.9–10.3)
Chloride: 88 mmol/L — ABNORMAL LOW (ref 98–111)
Creatinine, Ser: 0.67 mg/dL (ref 0.44–1.00)
GFR calc Af Amer: >60 mL/min (ref >60)
GFR calc non Af Amer: >60 mL/min (ref >60)
Glucose, Bld: 132 mg/dL — ABNORMAL HIGH (ref 70–99)
Potassium: 4.3 mmol/L (ref 3.5–5.1)
Sodium: 139 mmol/L (ref 135–145)

## 2018-08-22 LAB — HEPARIN LEVEL (UNFRACTIONATED)
Heparin Unfractionated: >2.2 IU/mL — ABNORMAL HIGH (ref 0.30–0.70)
Heparin Unfractionated: 0.87 IU/mL — ABNORMAL HIGH (ref 0.30–0.70)

## 2018-08-22 LAB — GLUCOSE, CAPILLARY
Glucose-Capillary: 148 mg/dL — ABNORMAL HIGH (ref 70–99)
Glucose-Capillary: 266 mg/dL — ABNORMAL HIGH (ref 70–99)

## 2018-08-22 LAB — APTT
aPTT: >200 seconds (ref 24–36)
aPTT: 81 seconds — ABNORMAL HIGH (ref 24–36)

## 2018-08-22 MED ORDER — PREDNISONE 20 MG PO TABS: ORAL_TABLET | ORAL | 0 refills | Status: DC

## 2018-08-22 MED ORDER — DOCUSATE SODIUM 100 MG PO CAPS: 100.0000 mg | ORAL_CAPSULE | Freq: Two times a day (BID) | ORAL | 1 refills | Status: DC

## 2018-08-22 MED ORDER — GUAIFENESIN ER 600 MG PO TB12: 600.0000 mg | ORAL_TABLET | Freq: Two times a day (BID) | ORAL | 0 refills | Status: DC

## 2018-08-22 MED ORDER — TRAMADOL HCL 50 MG PO TABS: 50.0000 mg | ORAL_TABLET | Freq: Three times a day (TID) | ORAL | 0 refills | Status: AC | PRN

## 2018-08-22 MED ORDER — POLYETHYLENE GLYCOL 3350 17 G PO PACK: 17.0000 g | PACK | Freq: Every day | ORAL | 0 refills | Status: DC | PRN

## 2018-08-22 MED ORDER — RIVAROXABAN 20 MG PO TABS: 20.0000 mg | ORAL_TABLET | Freq: Every day | ORAL | 0 refills | Status: DC

## 2018-08-22 NOTE — Plan of Care (Signed)
  Problem: Education: Goal: Knowledge of General Education information will improve Description: Including pain rating scale, medication(s)/side effects and non-pharmacologic comfort measures 08/22/2018 1333 by Rance Muir, RN Outcome: Adequate for Discharge 08/22/2018 1047 by Rance Muir, RN Outcome: Progressing   Problem: Health Behavior/Discharge Planning: Goal: Ability to manage health-related needs will improve 08/22/2018 1333 by Rance Muir, RN Outcome: Adequate for Discharge 08/22/2018 1047 by Rance Muir, RN Outcome: Progressing   Problem: Clinical Measurements: Goal: Ability to maintain clinical measurements within normal limits will improve 08/22/2018 1333 by Rance Muir, RN Outcome: Adequate for Discharge 08/22/2018 1047 by Rance Muir, RN Outcome: Progressing Goal: Will remain free from infection 08/22/2018 1333 by Rance Muir, RN Outcome: Adequate for Discharge 08/22/2018 1047 by Rance Muir, RN Outcome: Progressing Goal: Diagnostic test results will improve 08/22/2018 1333 by Rance Muir, RN Outcome: Adequate for Discharge 08/22/2018 1047 by Rance Muir, RN Outcome: Progressing Goal: Respiratory complications will improve 08/22/2018 1333 by Rance Muir, RN Outcome: Adequate for Discharge 08/22/2018 1047 by Rance Muir, RN Outcome: Progressing Goal: Cardiovascular complication will be avoided 08/22/2018 1333 by Rance Muir, RN Outcome: Adequate for Discharge 08/22/2018 1047 by Rance Muir, RN Outcome: Progressing   Problem: Activity: Goal: Risk for activity intolerance will decrease 08/22/2018 1333 by Rance Muir, RN Outcome: Adequate for Discharge 08/22/2018 1047 by Rance Muir, RN Outcome: Progressing   Problem: Nutrition: Goal: Adequate nutrition will be maintained 08/22/2018 1333 by Rance Muir, RN Outcome: Adequate for Discharge 08/22/2018 1047 by Rance Muir, RN Outcome: Progressing   Problem: Coping: Goal: Level of anxiety will decrease 08/22/2018 1333 by Rance Muir, RN Outcome: Adequate for  Discharge 08/22/2018 1047 by Rance Muir, RN Outcome: Progressing   Problem: Elimination: Goal: Will not experience complications related to bowel motility 08/22/2018 1333 by Rance Muir, RN Outcome: Adequate for Discharge 08/22/2018 1047 by Rance Muir, RN Outcome: Progressing Goal: Will not experience complications related to urinary retention 08/22/2018 1333 by Rance Muir, RN Outcome: Adequate for Discharge 08/22/2018 1047 by Rance Muir, RN Outcome: Progressing   Problem: Pain Managment: Goal: General experience of comfort will improve 08/22/2018 1333 by Rance Muir, RN Outcome: Adequate for Discharge 08/22/2018 1047 by Rance Muir, RN Outcome: Progressing   Problem: Safety: Goal: Ability to remain free from injury will improve 08/22/2018 1333 by Rance Muir, RN Outcome: Adequate for Discharge 08/22/2018 1047 by Rance Muir, RN Outcome: Progressing   Problem: Skin Integrity: Goal: Risk for impaired skin integrity will decrease 08/22/2018 1333 by Rance Muir, RN Outcome: Adequate for Discharge 08/22/2018 1047 by Rance Muir, RN Outcome: Progressing

## 2018-08-22 NOTE — Discharge Summary (Addendum)
Physician Discharge Summary  [name] Tutor RWE:[id number] DOB: March 19, 1945 DOA: 08/20/2018  PCP: Lavella Lemons, PA  Admit date: 08/20/2018 Discharge date: 08/22/2018  Time spent: 35 minutes  Recommendations for Outpatient Follow-up:  1. Repeat basic metabolic panel to follow electrolytes and renal function 2. Reassess CBC to follow hemoglobin trend.   Discharge Diagnoses:  COPD with acute exacerbation ([place]) Acute on chronic hypoxemic respiratory failure (H[facility]) Closed compression fracture of body of L1 vertebra (HCC) Constipation Type 2 diabetes mellitus Chronic atrial flutter (using Xarelto)  Discharge Condition: Stable and improved.  Patient discharged home with instruction to follow-up with PCP and interventional radiology as an outpatient.  Diet recommendation: Heart healthy and modified carbohydrate diet.  Filed Weights   08/20/18 1108 08/20/18 2018  Weight: 76 kg 76.6 kg    History of present illness:  As per H&P written by Dr. Manuella Ghazi on 08/20/2018 73 y.o. female with medical history significant for type 2 diabetes, chronic atrial flutter on Xarelto, and steroid-dependent COPD with chronic hypoxemia who was just discharged to home with acute COPD exacerbation secondary to pneumonia on 6/8.  She returns today after her home health nurse evaluated her at home and noted pulse oximetry reading of 76%.  EMS had arrived on the scene and noted that her home was very hot and had turned up her nasal cannula and transported her here.  She was noted to have some mild low back pain as well as no bowel movement over the last several days.  She was doing quite well on the day of discharge on 6/8 and was actually recommended to go to SNF on discharge, but patient refused and wanted to go home with home health.  Patient denies any chest tightness, wheezing, cough, fevers, or chills.  She is adamant that she has not had a decent bowel movement since her previous admission and states that she is only  had "small bowel movements," however, every time she was asked about bowel movement she reported positive bowel movements.  She denied any low back pain on the time of discharge, but did have an incident in the hospital where there was a questionable fall in the ICU.  She is now complaining of low back pain and discomfort.   ED Course: Vital signs are currently stable and patient is on 4 L nasal cannula.  This is usually her baseline home oxygen amount.  ABG with pH of 7.39 and PCO2 of 70 which is at her baseline.  PO2 of 68.  Laboratory data with blood glucose of 411, BNP of 889 which is chronically elevated, and troponin 0.03.  WBC count of 13,400 which is less than where she was on discharge.  Imaging demonstrates what appears to be a severe L1 compression fracture that is new compared to prior imaging with no paravertebral hematoma, which suggests that this may be chronic.  She is also noted to have significant stool burden in the right colon.  She is also noted to have bibasilar atelectasis.  She currently has no respiratory distress.  Hospital Course:  Acute on chronic respiratory failure secondary to COPD -No signs of acute infiltrates appreciated on repeat x-rays; positive bibasilar atelectasis seen. -Able to speak in full sentences, afebrile and no using accessory muscles -Continue oxygen supplementation -Patient discharge on home inhalers and the steroids tapering. -Will benefit of further education by physical therapy in oxygen conservation strategies. -Continue outpatient follow-up with pulmonologist as previously recommended.  Type 2 diabetes mellitus with hyperglycemia -Steroid therapy  most likely contributing to these -Continue modified carbohydrate diet -Resume home oral hypoglycemic agents.  Leukocytosis -Most likely associated with the use of steroids -Patient is afebrile and no source of acute infection appreciated. -Repeat CBC at follow-up visit to reassess her WBCs  trend.  L1 acute/subacute compression fracture -Intermittent discomfort reported by patient -Continue Tylenol and tramadol for pain control -Outpatient follow-up with interventional radiologist for kyphoplasty intervention. -Patient will need to be off Xarelto for 48 hours prior to intervention.  Moderate to severe constipation -Good bowel movement achieved with the use of MiraLAX and Colace -Patient advised to maintain adequate hydration and will be discharged on as needed bowel regimen.  History of atrial flutter -Currently rate controlled -Continue home medication -Continue secondary prevention with xarelto after kyphoplasty procedure. -Continue outpatient follow-up with cardiology service.  Procedures:  See below for x-ray reports.  Consultations:  IR  Discharge Exam: Vitals:   08/22/18 0611 08/22/18 0808  BP: 126/67   Pulse: 87   Resp: 17   Temp: 98.1 F (36.7 C)   SpO2: 91% 95%    General: Speaking in full sentences, denying chest pain, no nausea, no vomiting.  Reporting intermittent episode of lower back pain and expressing stable breathing.  Patient is afebrile. Cardiovascular: Rate control, no rubs, no gallops Respiratory: Good air movement, no wheezing currently; no using accessory muscles.  Good oxygen saturation on current oxygen supplementation. Abdomen: Soft, nontender, nondistended, positive bowel sounds. Extremities: No edema, no cyanosis, no clubbing.  Discharge Instructions   Discharge Instructions    Diet - low sodium heart healthy   Complete by: As directed    Discharge instructions   Complete by: As directed    Take medications as prescribed Maintain adequate hydration Follow heart healthy diet Follow-up with interventional radiologist for outpatient kyphoplasty procedure. Use oxygen supplementation 3.5-4 L nasal cannula as needed and make sure to please use of while performing activities.     Allergies as of 08/22/2018      Reactions    Codeine Other (See Comments)   "jittery"      Medication List    STOP taking these medications   psyllium 95 % Pack Commonly known as: HYDROCIL/METAMUCIL     TAKE these medications   acetaminophen 500 MG tablet Commonly known as: TYLENOL Take 500 mg by mouth every 8 (eight) hours as needed for mild pain or headache.   amiodarone 200 MG tablet Commonly known as: PACERONE Take 1 tablet (200 mg total) by mouth daily.   blood glucose meter kit and supplies Kit Dispense based on patient and insurance preference. Use up to four times daily as directed. (FOR ICD-9 250.00, 250.01).  Check sugar before meals and at night, keep a log.   docusate sodium 100 MG capsule Commonly known as: Colace Take 1 capsule (100 mg total) by mouth 2 (two) times daily.   Emergen-C Immune Pack Take 1 Package by mouth daily.   furosemide 40 MG tablet Commonly known as: LASIX Take 20 mg by mouth daily.   guaiFENesin 600 MG 12 hr tablet Commonly known as: MUCINEX Take 1 tablet (600 mg total) by mouth 2 (two) times daily.   metFORMIN 500 MG 24 hr tablet Commonly known as: GLUCOPHAGE-XR Take 500 mg by mouth daily.   metoprolol tartrate 50 MG tablet Commonly known as: LOPRESSOR Take 50 mg by mouth 2 (two) times daily.   OXYGEN Inhale 2.5-3 L into the lungs daily. At night and daily as needed   polyethylene glycol  17 g packet Commonly known as: MIRALAX / GLYCOLAX Take 17 g by mouth daily as needed for mild constipation.   predniSONE 20 MG tablet Commonly known as: Deltasone Take 3 tablets daily x1 day; then 2 tablets daily x2-day; then 1 tablet daily x2 days; then half tablet daily x3 days and stop prednisone. What changed:   how much to take  how to take this  when to take this  additional instructions   albuterol (2.5 MG/3ML) 0.083% nebulizer solution Commonly known as: PROVENTIL Take 2.5 mg by nebulization every 6 (six) hours as needed for wheezing or shortness of breath.    ProAir HFA 108 (90 Base) MCG/ACT inhaler Generic drug: albuterol Inhale 2 puffs into the lungs every 6 (six) hours as needed for wheezing or shortness of breath.   rivaroxaban 20 MG Tabs tablet Commonly known as: Xarelto Take 1 tablet (20 mg total) by mouth daily with supper. Stop Xarelto until Monday (08/26/18) in anticipation for kyphoplasty What changed: additional instructions   traMADol 50 MG tablet Commonly known as: ULTRAM Take 1 tablet (50 mg total) by mouth every 8 (eight) hours as needed for up to 5 days for severe pain.   Trelegy Ellipta 100-62.5-25 MCG/INH Aepb Generic drug: Fluticasone-Umeclidin-Vilant Take 1 puff by mouth daily.            Durable Medical Equipment  (From admission, onward)         Start     Ordered   08/22/18 0914  For home use only DME Other see comment  Once    Comments: rollator walker  Question:  Length of Need  Answer:  Lifetime   08/22/18 0914         Allergies  Allergen Reactions  . Codeine Other (See Comments)    "jittery"   Follow-up Information    Luanne Bras, MD Follow up.   Specialties: Interventional Radiology, Radiology Why: pt will hear from scheduler asap for OP procedure to be done at [facility] soon.  Call [phone] if any questions/concerns Contact information: [place] [address] [phone]        Lavella Lemons, [place]. Schedule an appointment as soon as possible for a visit in 10 day(s).   Specialty: Physician Assistant Contact information: [place] [address] [phone]        Herminio Commons, MD .   Specialty: Cardiology Contact information: [place] [address] [phone]           The results of significant diagnostics from this hospitalization (including imaging, microbiology, ancillary and laboratory) are listed below for reference.    Significant Diagnostic Studies: Ct Abdomen Pelvis Wo Contrast  Result Date:  08/20/2018 CLINICAL DATA:  Abdominal bloating and constipation.  Hypoxia. EXAM: CT ABDOMEN AND PELVIS WITHOUT CONTRAST TECHNIQUE: Multidetector CT imaging of the abdomen and pelvis was performed following the standard protocol without IV contrast. COMPARISON:  Abdominal x-rays dated August 13, 2018. Abdominal ultrasound dated February 21, 2016. FINDINGS: Lower chest: No acute abnormality.  Bibasilar atelectasis. Hepatobiliary: No focal liver abnormality is seen. No gallstones, gallbladder wall thickening, or biliary dilatation. Pancreas: Unremarkable. No pancreatic ductal dilatation or surrounding inflammatory changes. Spleen: Normal in size without focal abnormality. Adrenals/Urinary Tract: The adrenal glands are unremarkable. Small bilateral renal cysts. Punctate nonobstructive right renal calculi. 6 mm nonobstructive left renal calculus. No hydronephrosis. The bladder is unremarkable. Stomach/Bowel: Stomach is within normal limits. Appendix appears normal. No evidence of  bowel wall thickening, distention, or inflammatory changes. Diverticulosis of the descending colon. Large amount of stool in the right colon. Vascular/Lymphatic: Aortic atherosclerosis. No enlarged abdominal or pelvic lymph nodes. Reproductive: Uterus and bilateral adnexa are unremarkable. Other: No abdominal wall hernia or abnormality. No abdominopelvic ascites. No pneumoperitoneum. Musculoskeletal: New age-indeterminate moderate L1 compression fracture with 7 mm retropulsion, not present in April. No surrounding paravertebral hematoma. IMPRESSION: 1.  No acute intra-abdominal process. 2. Bilateral nonobstructive nephrolithiasis. 3. New age-indeterminate moderate L1 compression fracture with 7 mm retropulsion, not present in April. Lack of significant surrounding paravertebral hematoma suggests the fracture is chronic. Correlate with point tenderness. 4.  Aortic atherosclerosis (ICD10-I70.0). Electronically Signed   By: Titus Dubin M.D.   On:  08/20/2018 14:12   Dg Chest 2 View  Result Date: 08/20/2018 CLINICAL DATA:  Shortness of breath. Recently discharged from the hospital for pneumonia. EXAM: CHEST - 2 VIEW COMPARISON:  Chest x-rays dated August 15, 2018, and June 24, 2010. FINDINGS: Stable cardiomediastinal silhouette with enlargement of the main pulmonary artery. Normal pulmonary vascularity. Emphysematous changes again noted. Similar-appearing bibasilar atelectasis. No focal consolidation, pleural effusion, or pneumothorax. Age indeterminate severe L1 compression fracture, not present in April. IMPRESSION: 1. No active disease.  COPD and bibasilar atelectasis. 2. Age-indeterminate severe L1 compression fracture, not present in April. Correlate with point tenderness. Electronically Signed   By: Titus Dubin M.D.   On: 08/20/2018 14:05   Mr Lumbar Spine Wo Contrast  Result Date: 08/21/2018 CLINICAL DATA:  Low back pain EXAM: MRI LUMBAR SPINE WITHOUT CONTRAST TECHNIQUE: Multiplanar, multisequence MR imaging of the lumbar spine was performed. No intravenous contrast was administered. COMPARISON:  CT abdomen pelvis 08/20/2018 FINDINGS: Segmentation:  Normal Alignment:  Normal Vertebrae: There is a subacute compression fracture of L1 with a osteonecrotic cleft underlying the superior endplate. There is approximately 50% height loss with retropulsion of the posterosuperior corner measuring 6 mm. There is no other marrow abnormality. Conus medullaris and cauda equina: Conus extends to the L1 level. Conus and cauda equina appear normal. Paraspinal and other soft tissues: Bilateral renal cysts. Disc levels: T11-12: Normal. T12-L1: Normal disc space. Retropulsion from the L1 compression fracture mildly narrows the spinal canal. L1-2: Normal disc space. L2-3: Normal. L3-4: Normal. L4-5: Normal disc space. Mild right facet hypertrophy. No stenosis. L5-S1: Mild disc space narrowing without spinal canal or neural foraminal stenosis. IMPRESSION: 1.  Subacute compression fracture of L1 with 50% height loss and 6 mm retropulsion. Mild spinal canal stenosis. 2. Mild lumbar degenerative disc disease without spinal canal or neural foraminal stenosis otherwise. Electronically Signed   By: Ulyses Jarred M.D.   On: 08/21/2018 18:01   Dg Chest Port 1 View  Result Date: 08/15/2018 CLINICAL DATA:  Shortness of breath. EXAM: PORTABLE CHEST 1 VIEW COMPARISON:  Radiographs of August 13, 2018. FINDINGS: Stable cardiomediastinal silhouette. No pneumothorax is noted. Right lung is clear. Minimal left basilar atelectasis is noted with possible associated pleural effusion. Bony thorax is unremarkable. IMPRESSION: Minimal left basilar subsegmental atelectasis is noted with minimal left pleural effusion. Electronically Signed   By: Marijo Conception M.D.   On: 08/15/2018 09:48   Dg Chest Port 1 View  Result Date: 08/10/2018 CLINICAL DATA:  Short of breath EXAM: PORTABLE CHEST 1 VIEW COMPARISON:  06/24/2018 FINDINGS: Upper normal heart size. Low lung volumes. Subsegmental atelectasis at the right base. Hazy airspace disease at the left base. Upper lungs clear. No pneumothorax. IMPRESSION: Left basilar consolidation. Followup PA and lateral  chest X-ray is recommended in 3-4 weeks following trial of antibiotic therapy to ensure resolution and exclude underlying malignancy. Electronically Signed   By: Marybelle Killings M.D.   On: 08/10/2018 13:47   Dg Abd Acute 2+v W 1v Chest  Result Date: 08/13/2018 CLINICAL DATA:  Confusion, abdominal distension EXAM: DG ABDOMEN ACUTE W/ 1V CHEST COMPARISON:  Chest radiograph 08/10/2018 FINDINGS: Enlargement of cardiac silhouette. Mediastinal contours and pulmonary vascularity normal. Lordotic positioning. Bibasilar atelectasis and questionable small bibasilar effusions. No definite infiltrate or pneumothorax. Bones demineralized. Nonobstructive bowel gas pattern. Increased stool in RIGHT colon. No bowel dilatation, bowel wall thickening or free air.  No definite urinary tract calcifications. IMPRESSION: Nonobstructive bowel gas pattern with increased stool in RIGHT colon. Enlargement of cardiac silhouette. Bibasilar atelectasis and questionable small pleural effusions. Electronically Signed   By: Lavonia Dana M.D.   On: 08/13/2018 10:10    Microbiology: Recent Results (from the past 240 hour(s))  Culture, Urine     Status: None   Collection Time: 08/13/18  5:25 PM   Specimen: Urine, Random  Result Value Ref Range Status   Specimen Description   Final    URINE, RANDOM Performed at [facility], [address], [place], [place] [address]    Special Requests   Final    NONE Performed at [facility], [address], [place], [place] [address]    Culture   Final    NO GROWTH Performed at [facility], [place] [address], [place], [place] [address]    Report Status 08/15/2018 FINAL  Final  SARS Coronavirus 2 (CEPHEID - Performed in [place] hospital lab), Hosp Order     Status: None   Collection Time: 08/20/18  1:09 PM   Specimen: Nasopharyngeal Swab  Result Value Ref Range Status   SARS Coronavirus 2 NEGATIVE NEGATIVE Final    Comment: (NOTE) If result is NEGATIVE SARS-CoV-2 target nucleic acids are NOT DETECTED. The SARS-CoV-2 RNA is generally detectable in upper and lower  respiratory specimens during the acute phase of infection. The lowest  concentration of SARS-CoV-2 viral copies this assay can detect is 250  copies / mL. A negative result does not preclude SARS-CoV-2 infection  and should not be used as the sole basis for treatment or other  patient management decisions.  A negative result may occur with  improper specimen collection / handling, submission of specimen other  than nasopharyngeal swab, presence of viral mutation(s) within the  areas targeted by this assay, and inadequate number of viral copies  (<250 copies / mL). A negative result must be combined with clinical  observations, patient history, and  epidemiological information. If result is POSITIVE SARS-CoV-2 target nucleic acids are DETECTED. The SARS-CoV-2 RNA is generally detectable in upper and lower  respiratory specimens dur ing the acute phase of infection.  Positive  results are indicative of active infection with SARS-CoV-2.  Clinical  correlation with patient history and other diagnostic information is  necessary to determine patient infection status.  Positive results do  not rule out bacterial infection or co-infection with other viruses. If result is PRESUMPTIVE POSTIVE SARS-CoV-2 nucleic acids MAY BE PRESENT.   A presumptive positive result was obtained on the submitted specimen  and confirmed on repeat testing.  While 2019 novel coronavirus  (SARS-CoV-2) nucleic acids may be present in the submitted sample  additional confirmatory testing may be necessary for epidemiological  and / or clinical management purposes  to differentiate between  SARS-CoV-2 and other  Sarbecovirus currently known to infect humans.  If clinically indicated additional testing with an alternate test  methodology [phone]) is advised. The SARS-CoV-2 RNA is generally  detectable in upper and lower respiratory sp ecimens during the acute  phase of infection. The expected result is Negative. Fact Sheet for Patients:  StrictlyIdeas.no Fact Sheet for Healthcare Providers: BankingDealers.co.za This test is not yet approved or cleared by the [place] FDA and has been authorized for detection and/or diagnosis of SARS-CoV-2 by FDA under an Emergency Use Authorization (EUA).  This EUA will remain in effect (meaning this test can be used) for the duration of the COVID-19 declaration under Section 564(b)(1) of the Act, 21 U.S.C. section 360bbb-3(b)(1), unless the authorization is terminated or revoked sooner. Performed at [facility], [address], [place], [place] [address]   Urine culture      Status: Abnormal (Preliminary result)   Collection Time: 08/20/18  5:50 PM   Specimen: Urine, Clean Catch  Result Value Ref Range Status   Specimen Description   Final    URINE, CLEAN CATCH Performed at [facility], [address] [place], [place] [address]    Special Requests   Final    NONE Performed at [facility], [address], [place], [place] [address]    Culture (A)  Final    60,000 COLONIES/mL ESCHERICHIA COLI SUSCEPTIBILITIES TO FOLLOW Performed at [facility], [place] [address] [place], [place] [address]    Report Status PENDING  Incomplete     Labs: Basic Metabolic Panel: Recent Labs  Lab 08/16/18 0433 08/18/18 0606 08/20/18 1228 08/21/18 0502 08/22/18 0637  NA 140 141 141 142 139  K 4.4 3.8 4.4 4.2 4.3  CL 89* 88* 92* 92* 88*  CO2 38* 40* 34* 37* 35*  GLUCOSE 216* 214* 411* 205* 132*  BUN 20 31* 31* 27* 25*  CREATININE 0.57 0.68 0.73 0.67 0.67  CALCIUM 9.8 10.0 9.6 9.3 9.6  MG  --   --   --  2.3  --    Liver Function Tests: Recent Labs  Lab 08/20/18 1228  AST 20  ALT 27  ALKPHOS 68  BILITOT 0.8  PROT 6.3*  ALBUMIN 3.7   Recent Labs  Lab 08/20/18 1228  LIPASE 51   CBC: Recent Labs  Lab 08/16/18 0433 08/18/18 0606 08/20/18 1228 08/21/18 0502 08/22/18 0637  WBC 13.0* 16.5* 13.4* 17.0* 18.1*  NEUTROABS  --   --  11.9*  --   --   HGB 12.0 11.8* 11.8* 11.1* 12.2  HCT 39.7 40.8 38.6 36.2 41.5  MCV 96.1 98.3 96.5 98.4 99.8  PLT 249 315 276 268 298   Cardiac Enzymes: Recent Labs  Lab 08/20/18 1228  TROPONINI 0.03*   BNP: BNP (last 3 results) Recent Labs    06/24/18 1302 08/10/18 1156 08/20/18 1228  BNP 894.0* 726.0* 889.0*   CBG: Recent Labs  Lab 08/21/18 1210 08/21/18 1614 08/21/18 2108 08/22/18 0738 08/22/18 1121  GLUCAP 322* 88 170* 148* 266*    Signed:  Barton Dubois MD.  [facility] 08/22/2018, 12:57 PM

## 2018-08-22 NOTE — Progress Notes (Signed)
CRITICAL VALUE ALERT  Critical Value:  ptt greater than 200  Date & Time Notied:  08/22/18 @0845   Provider Notified: Dr Dyann Kief  Orders Received/Actions taken: waiting for orders

## 2018-08-22 NOTE — Progress Notes (Signed)
Patient ID: Eileen [name], female   DOB: Jun 05, 1945, 73 y.o.   MRN: [id number]   [facility] MD requesting review for L1 KP Pt is painful at site per notes  MRI reviewed with IR Rad Approves procedure  Schedule will allow for procedure maybe Mon/Tues of next week  Insurance is in review-- must be per certified Can take 3-5 days (although not commonly more than a 1-2 days-- especially if IP)  LM to Dr Dyann Kief  Will need to let me know what you feel is appropriate Can be done as OP-- Insurance per cert is different for OP But certainly an option  Already of Xarelto- Hep can be held prior to procedure Afeb Wbc high-- poss secondary Prednisone

## 2018-08-22 NOTE — Progress Notes (Signed)
Patient ID: Eileen [name], female   DOB: September 22, 1945, 73 y.o.   MRN: [id number]   Pt now to be discharged to home Treatment for back pain po as per MD  She will hear from scheduler for appt for L1 KP Was scheduled as IP for Mon June 15--- ? If still can be this date as Associate Professor now underway as OP  MD is aware He is informing pt  He is aware will need off Xarelto 2 days before procedure

## 2018-08-22 NOTE — Plan of Care (Signed)

## 2018-08-22 NOTE — Progress Notes (Signed)
Patient discharged home via EMS at this time.  Discharge instructions reviewed with patient who states understanding.  IV removed and covered with dry sterile dressing.

## 2018-08-22 NOTE — Progress Notes (Signed)
ANTICOAGULATION CONSULT NOTE - follow up Story for heparin infusion Indication: atrial fibrillation  Allergies  Allergen Reactions  . Codeine Other (See Comments)    "jittery"    Patient Measurements: Height: 5' 2"  (157.5 cm) Weight: 168 lb 14 oz (76.6 kg) IBW/kg (Calculated) : 50.1 Heparin Dosing Weight: HEPARIN DW (KG): 66.8  Vital Signs: Temp: 98.1 F (36.7 C) (06/11 0611) BP: 126/67 (06/11 0611) Pulse Rate: 87 (06/11 0611)  Labs: Recent Labs    08/20/18 1228  08/21/18 0502 08/21/18 1751 08/22/18 0637 08/22/18 1001  HGB 11.8*  --  11.1*  --  12.2  --   HCT 38.6  --  36.2  --  41.5  --   PLT 276  --  268  --  298  --   APTT <20*  --  26 38* >200* 81*  LABPROT 12.6  --   --   --   --   --   INR 1.0  --   --   --   --   --   HEPARINUNFRC  --    < > 0.13* 0.33 >2.20* 0.87*  CREATININE 0.73  --  0.67  --  0.67  --   TROPONINI 0.03*  --   --   --   --   --    < > = values in this interval not displayed.    Estimated Creatinine Clearance: 60 mL/min (by C-G formula based on SCr of 0.67 mg/dL).   Medical History: Past Medical History:  Diagnosis Date  . Asthma   . Atrial flutter ([place])   . Cancer [facility])    Right breast  . COPD (chronic obstructive pulmonary disease) ([place])   . History of breast cancer    right breast  . Hypertension   . On home O2   . Type 2 diabetes mellitus (H[facility])     Medications:  Medications Prior to Admission  Medication Sig Dispense Refill Last Dose  . acetaminophen (TYLENOL) 500 MG tablet Take 500 mg by mouth every 8 (eight) hours as needed for mild pain or headache.    08/20/2018 at Unknown time  . albuterol (PROVENTIL) (2.5 MG/3ML) 0.083% nebulizer solution Take 2.5 mg by nebulization every 6 (six) hours as needed for wheezing or shortness of breath.   unknown  . amiodarone (PACERONE) 200 MG tablet Take 1 tablet (200 mg total) by mouth daily. 90 tablet 3 08/20/2018 at Unknown time  . blood glucose meter kit and  supplies KIT Dispense based on patient and insurance preference. Use up to four times daily as directed. (FOR ICD-9 250.00, 250.01).  Check sugar before meals and at night, keep a log. 1 each 0 unkown  . furosemide (LASIX) 40 MG tablet Take 20 mg by mouth daily.    08/20/2018 at Unknown time  . metFORMIN (GLUCOPHAGE-XR) 500 MG 24 hr tablet Take 500 mg by mouth daily.  1 08/20/2018 at Unknown time  . metoprolol tartrate (LOPRESSOR) 50 MG tablet Take 50 mg by mouth 2 (two) times daily.   08/20/2018 at 900  . Multiple Vitamins-Minerals (EMERGEN-C IMMUNE) PACK Take 1 Package by mouth daily.   08/20/2018 at Unknown time  . OXYGEN Inhale 2.5-3 L into the lungs daily. At night and daily as needed   08/19/2018 at Unknown time  . predniSONE (DELTASONE) 20 MG tablet Take 2 tablets (40 mg total) by mouth daily for 5 days. 10 tablet 0 08/20/2018 at Unknown time  . PROAIR HFA  108 (90 BASE) MCG/ACT inhaler Inhale 2 puffs into the lungs every 6 (six) hours as needed for wheezing or shortness of breath.    unknown  . psyllium (HYDROCIL/METAMUCIL) 95 % PACK Take 1 packet by mouth daily. (Patient taking differently: Take 1 packet by mouth daily as needed. ) 240 each 0 unknown  . rivaroxaban (XARELTO) 20 MG TABS tablet Take 1 tablet (20 mg total) by mouth daily with supper. (Patient taking differently: Take 20 mg by mouth every morning. ) 21 tablet 0 08/20/2018 at 900  . TRELEGY ELLIPTA 100-62.5-25 MCG/INH AEPB Take 1 puff by mouth daily.  0 08/20/2018 at Unknown time    Assessment: Pharmacy consulted to dose heparin infusion for this 73 yo female with chronic atrial flutter.  She was just discharged   home yesterday after admission for COPD exacerbation due to pneumonia.  Patient has been on Xarelto, but it's being held for potential need for kyphoplasty.  Patient states she took her last dose of Xarelto at 0900 on 6-9.    APTT: 81 HL: 0.87  APTT therapeutic and heparin level still being affected by home Xarelto.  Continue to  monitor based on APTT until correlation.  Goal of Therapy:  APTT 66-102 Heparin level 0.3-0.7 units/ml Monitor platelets by anticoagulation protocol: Yes   Plan:    Continue heparin infusion at 1050 units/hr  APTT and heparin level in ~8 hours Continue to monitor H&H and platelets  Margot Ables, PharmD Clinical Pharmacist 08/22/2018 11:23 AM

## 2018-08-22 NOTE — TOC Transition Note (Signed)
Transition of Care [facility]) - CM/SW Discharge Note   Patient Details  Name: Eileen [name] MRN: [id number] Date of Birth: June 04, 1945  Transition of Care [facility]) CM/SW Contact:  Shade Flood, LCSW Phone Number: 08/22/2018, 1:04 PM   Clinical Narrative:     Pt stable for dc today per MD. Pt states she wants to return home with continued [place] from [facility]. Daughter to help pt exchange the walker she got on Monday for a Rollator. Orders sent to [facility]. Updated Linda from [facility] about pt's dc. Updated [facility] CM about dc as well. PCP appointment made for hospital follow up. There are no other TOC needs for dc.  Final next level of care: [place] Barriers to Discharge: Barriers Resolved   Patient Goals and CMS Choice        Discharge Placement                       Discharge Plan and Services                            [facility] Agency: [place] ([facility])        Social Determinants of Health (SDOH) Interventions     Readmission Risk Interventions Readmission Risk Prevention Plan 08/21/2018 08/19/2018 08/12/2018  Transportation Screening Complete - Complete  PCP or Specialist Appt within 5-7 Days - Complete -  Home Care Screening Complete - Complete  Medication Review (RN CM) - Complete -  Some recent data might be hidden

## 2018-08-23 ENCOUNTER — Other Ambulatory Visit (HOSPITAL_COMMUNITY): Payer: Self-pay | Admitting: Interventional Radiology

## 2018-08-23 ENCOUNTER — Encounter: Payer: Self-pay | Admitting: *Deleted

## 2018-08-23 ENCOUNTER — Other Ambulatory Visit: Payer: Self-pay | Admitting: *Deleted

## 2018-08-23 DIAGNOSIS — S32010A Wedge compression fracture of first lumbar vertebra, initial encounter for closed fracture: Secondary | ICD-10-CM

## 2018-08-23 LAB — URINE CULTURE: Culture: 60000 — AB

## 2018-08-23 NOTE — Patient Outreach (Addendum)
Pt hospitalized 08/20/18-08/22/18 for COPD exacerbation, Primary care provider completes transition of care , Rn CM spoke with pt, HIPAA verified, pt reports she has " some shortness of breath, but about the same", states she is concerned that her 02 saturations are in 60's range at times and fluctuating from 60-90's, pt states she called primary care provider and reported and awaiting call back,  RN CM spoke with patient's daughter [name] and she says her mother's hands are cold and not sure the reading is correct.  RN CM ask that they warm patient's hands under warm water and then recheck, [name] also feels something may be wrong with the oxygen concentrator and she is going to call [employer] about this.  RN CM to call back within the hour after daughter rechecks 02 saturation. RN CM called back and spoke with pt and daughter, they contacted oxygen provider [place] and states there was a hole in the tubing which has been replaced.  Pt states oxygen levels now in high 80's to 90.  RN CM informed pt home health will be completing visit tomorrow, reviewed action plan for COPD, instructed pt if she has any other issues with 02 saturations dropping , she needs to seek care at ED, pt and daughter verbalize understanding.  Pt needs to end call as she has been on the phone with MD office and multiple other people. RN CM called and left voicemail with Jeanmarie Hubert RN with [place] with update of the above information.  [facility] CM Care Plan Problem One     Most Recent Value  Care Plan Problem One  Knowledge deficit related to COPD  Role Documenting the Problem One  Care Management Coordinator  Care Plan for Problem One  Active  [facility] Long Term Goal   Pt will verbalize improved self care for COPD within 60 days.  [facility] Long Term Goal Start Date  08/23/18 Barrie Folk re-established]  Interventions for Problem One Long Term Goal  RN CM reinforced plan of care with pt, collaborated with home health  ([facility]), spoke with [facility] RN Jeanmarie Hubert who confirms they will see pt tomorrow 08/17/18 for resumption of care  [facility] CM Short Term Goal #1   Pt will verbalize COPD action plan/ zones within 30 days  [facility] CM Short Term Goal #1 Start Date  08/23/18 Barrie Folk re-established]  Interventions for Short Term Goal #1  RN CM reviewed COPD action plan, importance of staying in touch with primary MD, pt states she has talked with her primary provider office several times today  [facility] CM Short Term Goal #2   Pt will verbalize signs/ symptoms exacerbation of A-Fib/ A-flutter within 30 days.  [facility] CM Short Term Goal #2 Start Date  07/22/18 [goal re-established]  [facility] CM Short Term Goal #2 Met Date  08/23/18  Interventions for Short Term Goal #2  RN CM reviewed signs/ symptoms exacerbation A-fib, importance of being aware of how she is feeling daily      PLAN Outreach pt for follow up next week Ask about 02 saturation, CBG Collaborate with home health as needed Outreach pt beginning of July for telephone assessment  Jacqlyn Larsen [facility], [place] Coordinator [phone]

## 2018-08-24 DIAGNOSIS — Z7984 Long term (current) use of oral hypoglycemic drugs: Secondary | ICD-10-CM | POA: Diagnosis not present

## 2018-08-24 DIAGNOSIS — E119 Type 2 diabetes mellitus without complications: Secondary | ICD-10-CM | POA: Diagnosis not present

## 2018-08-24 DIAGNOSIS — J189 Pneumonia, unspecified organism: Secondary | ICD-10-CM | POA: Diagnosis not present

## 2018-08-24 DIAGNOSIS — Z9181 History of falling: Secondary | ICD-10-CM | POA: Diagnosis not present

## 2018-08-24 DIAGNOSIS — I4892 Unspecified atrial flutter: Secondary | ICD-10-CM | POA: Diagnosis not present

## 2018-08-24 DIAGNOSIS — J9621 Acute and chronic respiratory failure with hypoxia: Secondary | ICD-10-CM | POA: Diagnosis not present

## 2018-08-24 DIAGNOSIS — Z7901 Long term (current) use of anticoagulants: Secondary | ICD-10-CM | POA: Diagnosis not present

## 2018-08-24 DIAGNOSIS — J440 Chronic obstructive pulmonary disease with acute lower respiratory infection: Secondary | ICD-10-CM | POA: Diagnosis not present

## 2018-08-24 DIAGNOSIS — Z853 Personal history of malignant neoplasm of breast: Secondary | ICD-10-CM | POA: Diagnosis not present

## 2018-08-24 DIAGNOSIS — Z9011 Acquired absence of right breast and nipple: Secondary | ICD-10-CM | POA: Diagnosis not present

## 2018-08-24 DIAGNOSIS — Z7952 Long term (current) use of systemic steroids: Secondary | ICD-10-CM | POA: Diagnosis not present

## 2018-08-24 DIAGNOSIS — I10 Essential (primary) hypertension: Secondary | ICD-10-CM | POA: Diagnosis not present

## 2018-08-24 DIAGNOSIS — Z9981 Dependence on supplemental oxygen: Secondary | ICD-10-CM | POA: Diagnosis not present

## 2018-08-26 ENCOUNTER — Ambulatory Visit (HOSPITAL_COMMUNITY): Admit: 2018-08-26 | Payer: Medicare Other

## 2018-08-26 ENCOUNTER — Other Ambulatory Visit: Payer: Self-pay | Admitting: Student

## 2018-08-26 DIAGNOSIS — J440 Chronic obstructive pulmonary disease with acute lower respiratory infection: Secondary | ICD-10-CM | POA: Diagnosis not present

## 2018-08-26 DIAGNOSIS — Z7984 Long term (current) use of oral hypoglycemic drugs: Secondary | ICD-10-CM | POA: Diagnosis not present

## 2018-08-26 DIAGNOSIS — I4892 Unspecified atrial flutter: Secondary | ICD-10-CM | POA: Diagnosis not present

## 2018-08-26 DIAGNOSIS — Z9981 Dependence on supplemental oxygen: Secondary | ICD-10-CM | POA: Diagnosis not present

## 2018-08-26 DIAGNOSIS — J9621 Acute and chronic respiratory failure with hypoxia: Secondary | ICD-10-CM | POA: Diagnosis not present

## 2018-08-26 DIAGNOSIS — Z7952 Long term (current) use of systemic steroids: Secondary | ICD-10-CM | POA: Diagnosis not present

## 2018-08-26 DIAGNOSIS — I10 Essential (primary) hypertension: Secondary | ICD-10-CM | POA: Diagnosis not present

## 2018-08-26 DIAGNOSIS — E119 Type 2 diabetes mellitus without complications: Secondary | ICD-10-CM | POA: Diagnosis not present

## 2018-08-26 DIAGNOSIS — Z7901 Long term (current) use of anticoagulants: Secondary | ICD-10-CM | POA: Diagnosis not present

## 2018-08-26 DIAGNOSIS — Z853 Personal history of malignant neoplasm of breast: Secondary | ICD-10-CM | POA: Diagnosis not present

## 2018-08-26 DIAGNOSIS — Z9181 History of falling: Secondary | ICD-10-CM | POA: Diagnosis not present

## 2018-08-26 DIAGNOSIS — Z9011 Acquired absence of right breast and nipple: Secondary | ICD-10-CM | POA: Diagnosis not present

## 2018-08-26 DIAGNOSIS — J189 Pneumonia, unspecified organism: Secondary | ICD-10-CM | POA: Diagnosis not present

## 2018-08-27 ENCOUNTER — Ambulatory Visit (HOSPITAL_COMMUNITY)
Admission: RE | Admit: 2018-08-27 | Discharge: 2018-08-27 | Disposition: A | Discharge status: Home / Self Care | Payer: Medicare Other | Source: Ambulatory Visit | Attending: Interventional Radiology | Admitting: Interventional Radiology

## 2018-08-27 ENCOUNTER — Other Ambulatory Visit: Payer: Self-pay

## 2018-08-27 ENCOUNTER — Encounter (HOSPITAL_COMMUNITY): Payer: Self-pay

## 2018-08-27 DIAGNOSIS — S32010A Wedge compression fracture of first lumbar vertebra, initial encounter for closed fracture: Secondary | ICD-10-CM

## 2018-08-27 DIAGNOSIS — Z539 Procedure and treatment not carried out, unspecified reason: Secondary | ICD-10-CM | POA: Diagnosis not present

## 2018-08-27 LAB — PROTIME-INR
INR: 1 (ref 0.8–1.2)
Prothrombin Time: 13.2 seconds (ref 11.4–15.2)

## 2018-08-27 LAB — URINALYSIS, ROUTINE W REFLEX MICROSCOPIC
Bilirubin Urine: NEGATIVE
Glucose, UA: >=500 mg/dL — AB
Ketones, ur: 5 mg/dL — AB
Nitrite: NEGATIVE
Protein, ur: 30 mg/dL — AB
Specific Gravity, Urine: 1.029 (ref 1.005–1.030)
pH: 5 (ref 5.0–8.0)

## 2018-08-27 LAB — CBC
HCT: 38.3 % (ref 36.0–46.0)
Hemoglobin: 11.2 g/dL — ABNORMAL LOW (ref 12.0–15.0)
MCH: 29.1 pg (ref 26.0–34.0)
MCHC: 29.2 g/dL — ABNORMAL LOW (ref 30.0–36.0)
MCV: 99.5 fL (ref 80.0–100.0)
Platelets: 313 10*3/uL (ref 150–400)
RBC: 3.85 MIL/uL — ABNORMAL LOW (ref 3.87–5.11)
RDW: 13.7 % (ref 11.5–15.5)
WBC: 15.9 10*3/uL — ABNORMAL HIGH (ref 4.0–10.5)
nRBC: 0 % (ref 0.0–0.2)

## 2018-08-27 LAB — GLUCOSE, CAPILLARY: Glucose-Capillary: 225 mg/dL — ABNORMAL HIGH (ref 70–99)

## 2018-08-27 MED ORDER — CEFAZOLIN SODIUM-DEXTROSE 2-4 GM/100ML-% IV SOLN: 2.0000 g | Freq: Once | INTRAVENOUS | Status: DC

## 2018-08-27 MED ORDER — SODIUM CHLORIDE 0.9 % IV SOLN: INTRAVENOUS | Status: DC

## 2018-08-27 NOTE — Progress Notes (Signed)
Patient was scheduled for an image-guided L1 KP/VP tentatively for today with Dr. Estanislado Pandy.  Urinalysis today revealed bacteria and leukocytes- probable UTI. Discussed case with Dr. Estanislado Pandy who recommends postponing procedure until UTI treated due to infection risk. Will treat with Ciprofloxacin 250 mg taken twice daily x 3 days and recheck UA Friday. If UA trending closer to normal, plan for procedure Friday at 10 am. Informed patient of above, pre-procedure handout and prescription for Ciprofloxacin given to patient. Informed patient that she must begin taking this antibiotic today if she wants procedure done Friday. All questions answered and concerns addressed. Patient conveys understanding and agrees with plan.  Please call IR with questions/concerns.   Bea Graff Louk, PA-C 08/27/2018, 12:10 PM

## 2018-08-28 ENCOUNTER — Other Ambulatory Visit: Payer: Self-pay | Admitting: Radiology

## 2018-08-28 DIAGNOSIS — J9611 Chronic respiratory failure with hypoxia: Secondary | ICD-10-CM | POA: Diagnosis not present

## 2018-08-28 DIAGNOSIS — I4892 Unspecified atrial flutter: Secondary | ICD-10-CM | POA: Diagnosis not present

## 2018-08-28 DIAGNOSIS — J449 Chronic obstructive pulmonary disease, unspecified: Secondary | ICD-10-CM | POA: Diagnosis not present

## 2018-08-28 DIAGNOSIS — I5032 Chronic diastolic (congestive) heart failure: Secondary | ICD-10-CM | POA: Diagnosis not present

## 2018-08-29 DIAGNOSIS — J189 Pneumonia, unspecified organism: Secondary | ICD-10-CM | POA: Diagnosis not present

## 2018-08-29 DIAGNOSIS — I10 Essential (primary) hypertension: Secondary | ICD-10-CM | POA: Diagnosis not present

## 2018-08-29 DIAGNOSIS — Z7952 Long term (current) use of systemic steroids: Secondary | ICD-10-CM | POA: Diagnosis not present

## 2018-08-29 DIAGNOSIS — J9621 Acute and chronic respiratory failure with hypoxia: Secondary | ICD-10-CM | POA: Diagnosis not present

## 2018-08-29 DIAGNOSIS — J440 Chronic obstructive pulmonary disease with acute lower respiratory infection: Secondary | ICD-10-CM | POA: Diagnosis not present

## 2018-08-29 DIAGNOSIS — E119 Type 2 diabetes mellitus without complications: Secondary | ICD-10-CM | POA: Diagnosis not present

## 2018-08-29 DIAGNOSIS — Z9181 History of falling: Secondary | ICD-10-CM | POA: Diagnosis not present

## 2018-08-29 DIAGNOSIS — Z7901 Long term (current) use of anticoagulants: Secondary | ICD-10-CM | POA: Diagnosis not present

## 2018-08-29 DIAGNOSIS — Z9011 Acquired absence of right breast and nipple: Secondary | ICD-10-CM | POA: Diagnosis not present

## 2018-08-29 DIAGNOSIS — I4892 Unspecified atrial flutter: Secondary | ICD-10-CM | POA: Diagnosis not present

## 2018-08-29 DIAGNOSIS — Z853 Personal history of malignant neoplasm of breast: Secondary | ICD-10-CM | POA: Diagnosis not present

## 2018-08-29 DIAGNOSIS — Z9981 Dependence on supplemental oxygen: Secondary | ICD-10-CM | POA: Diagnosis not present

## 2018-08-29 DIAGNOSIS — Z7984 Long term (current) use of oral hypoglycemic drugs: Secondary | ICD-10-CM | POA: Diagnosis not present

## 2018-08-30 ENCOUNTER — Telehealth: Payer: Self-pay | Admitting: Student

## 2018-08-30 ENCOUNTER — Encounter (HOSPITAL_COMMUNITY): Payer: Self-pay

## 2018-08-30 ENCOUNTER — Other Ambulatory Visit: Payer: Self-pay

## 2018-08-30 ENCOUNTER — Other Ambulatory Visit: Payer: Self-pay | Admitting: *Deleted

## 2018-08-30 ENCOUNTER — Ambulatory Visit (HOSPITAL_COMMUNITY)
Admission: RE | Admit: 2018-08-30 | Discharge: 2018-08-30 | Disposition: A | Discharge status: Home / Self Care | Payer: Medicare Other | Source: Ambulatory Visit | Attending: Interventional Radiology | Admitting: Interventional Radiology

## 2018-08-30 DIAGNOSIS — Z79899 Other long term (current) drug therapy: Secondary | ICD-10-CM | POA: Insufficient documentation

## 2018-08-30 DIAGNOSIS — I10 Essential (primary) hypertension: Secondary | ICD-10-CM | POA: Diagnosis not present

## 2018-08-30 DIAGNOSIS — E669 Obesity, unspecified: Secondary | ICD-10-CM | POA: Diagnosis not present

## 2018-08-30 DIAGNOSIS — Z539 Procedure and treatment not carried out, unspecified reason: Secondary | ICD-10-CM | POA: Diagnosis not present

## 2018-08-30 DIAGNOSIS — E119 Type 2 diabetes mellitus without complications: Secondary | ICD-10-CM | POA: Insufficient documentation

## 2018-08-30 DIAGNOSIS — Z9981 Dependence on supplemental oxygen: Secondary | ICD-10-CM | POA: Diagnosis not present

## 2018-08-30 DIAGNOSIS — Z7901 Long term (current) use of anticoagulants: Secondary | ICD-10-CM | POA: Diagnosis not present

## 2018-08-30 DIAGNOSIS — Z6831 Body mass index (BMI) 31.0-31.9, adult: Secondary | ICD-10-CM | POA: Diagnosis not present

## 2018-08-30 DIAGNOSIS — I4892 Unspecified atrial flutter: Secondary | ICD-10-CM | POA: Insufficient documentation

## 2018-08-30 DIAGNOSIS — J449 Chronic obstructive pulmonary disease, unspecified: Secondary | ICD-10-CM | POA: Insufficient documentation

## 2018-08-30 DIAGNOSIS — Z853 Personal history of malignant neoplasm of breast: Secondary | ICD-10-CM | POA: Diagnosis not present

## 2018-08-30 DIAGNOSIS — M4856XA Collapsed vertebra, not elsewhere classified, lumbar region, initial encounter for fracture: Secondary | ICD-10-CM | POA: Diagnosis not present

## 2018-08-30 DIAGNOSIS — Z7984 Long term (current) use of oral hypoglycemic drugs: Secondary | ICD-10-CM | POA: Insufficient documentation

## 2018-08-30 DIAGNOSIS — Z87891 Personal history of nicotine dependence: Secondary | ICD-10-CM | POA: Diagnosis not present

## 2018-08-30 DIAGNOSIS — M810 Age-related osteoporosis without current pathological fracture: Secondary | ICD-10-CM | POA: Insufficient documentation

## 2018-08-30 DIAGNOSIS — R0902 Hypoxemia: Secondary | ICD-10-CM | POA: Insufficient documentation

## 2018-08-30 DIAGNOSIS — S32010A Wedge compression fracture of first lumbar vertebra, initial encounter for closed fracture: Secondary | ICD-10-CM | POA: Diagnosis not present

## 2018-08-30 LAB — CBC
HCT: 37.4 % (ref 36.0–46.0)
Hemoglobin: 11 g/dL — ABNORMAL LOW (ref 12.0–15.0)
MCH: 29.3 pg (ref 26.0–34.0)
MCHC: 29.4 g/dL — ABNORMAL LOW (ref 30.0–36.0)
MCV: 99.5 fL (ref 80.0–100.0)
Platelets: 279 10*3/uL (ref 150–400)
RBC: 3.76 MIL/uL — ABNORMAL LOW (ref 3.87–5.11)
RDW: 13.9 % (ref 11.5–15.5)
WBC: 14.3 10*3/uL — ABNORMAL HIGH (ref 4.0–10.5)
nRBC: 0 % (ref 0.0–0.2)

## 2018-08-30 LAB — URINALYSIS, COMPLETE (UACMP) WITH MICROSCOPIC
Bacteria, UA: NONE SEEN
Glucose, UA: 150 mg/dL — AB
Hgb urine dipstick: NEGATIVE
Ketones, ur: 5 mg/dL — AB
Nitrite: NEGATIVE
Protein, ur: 100 mg/dL — AB
Specific Gravity, Urine: 1.035 — ABNORMAL HIGH (ref 1.005–1.030)
pH: 6 (ref 5.0–8.0)

## 2018-08-30 LAB — CBC WITH DIFFERENTIAL/PLATELET
Abs Immature Granulocytes: 0.11 10*3/uL — ABNORMAL HIGH (ref 0.00–0.07)
Basophils Absolute: 0 10*3/uL (ref 0.0–0.1)
Basophils Relative: 0 %
Eosinophils Absolute: 0.1 10*3/uL (ref 0.0–0.5)
Eosinophils Relative: 0 %
HCT: 37.6 % (ref 36.0–46.0)
Hemoglobin: 11 g/dL — ABNORMAL LOW (ref 12.0–15.0)
Immature Granulocytes: 1 %
Lymphocytes Relative: 4 %
Lymphs Abs: 0.6 10*3/uL — ABNORMAL LOW (ref 0.7–4.0)
MCH: 29.1 pg (ref 26.0–34.0)
MCHC: 29.3 g/dL — ABNORMAL LOW (ref 30.0–36.0)
MCV: 99.5 fL (ref 80.0–100.0)
Monocytes Absolute: 0.4 10*3/uL (ref 0.1–1.0)
Monocytes Relative: 3 %
Neutro Abs: 12.8 10*3/uL — ABNORMAL HIGH (ref 1.7–7.7)
Neutrophils Relative %: 92 %
Platelets: 286 10*3/uL (ref 150–400)
RBC: 3.78 MIL/uL — ABNORMAL LOW (ref 3.87–5.11)
RDW: 13.9 % (ref 11.5–15.5)
WBC: 13.9 10*3/uL — ABNORMAL HIGH (ref 4.0–10.5)
nRBC: 0 % (ref 0.0–0.2)

## 2018-08-30 LAB — BASIC METABOLIC PANEL
Anion gap: 9 (ref 5–15)
BUN: 10 mg/dL (ref 8–23)
CO2: 39 mmol/L — ABNORMAL HIGH (ref 22–32)
Calcium: 9.2 mg/dL (ref 8.9–10.3)
Chloride: 93 mmol/L — ABNORMAL LOW (ref 98–111)
Creatinine, Ser: 0.73 mg/dL (ref 0.44–1.00)
GFR calc Af Amer: >60 mL/min (ref >60)
GFR calc non Af Amer: >60 mL/min (ref >60)
Glucose, Bld: 232 mg/dL — ABNORMAL HIGH (ref 70–99)
Potassium: 3.7 mmol/L (ref 3.5–5.1)
Sodium: 141 mmol/L (ref 135–145)

## 2018-08-30 LAB — PROTIME-INR
INR: 1 (ref 0.8–1.2)
Prothrombin Time: 13.3 seconds (ref 11.4–15.2)

## 2018-08-30 MED ORDER — BUPIVACAINE HCL (PF) 0.5 % IJ SOLN
INTRAMUSCULAR | Status: AC
  Filled 2018-08-30: qty 30

## 2018-08-30 MED ORDER — CEFAZOLIN SODIUM-DEXTROSE 2-4 GM/100ML-% IV SOLN
INTRAVENOUS | Status: AC
  Filled 2018-08-30: qty 100

## 2018-08-30 MED ORDER — GELATIN ABSORBABLE 12-7 MM EX MISC
CUTANEOUS | Status: AC
  Filled 2018-08-30: qty 1

## 2018-08-30 MED ORDER — CEFAZOLIN SODIUM-DEXTROSE 2-4 GM/100ML-% IV SOLN: 2.0000 g | INTRAVENOUS | Status: DC

## 2018-08-30 MED ORDER — FENTANYL CITRATE (PF) 100 MCG/2ML IJ SOLN
INTRAMUSCULAR | Status: AC
  Filled 2018-08-30: qty 2

## 2018-08-30 MED ORDER — TOBRAMYCIN SULFATE 1.2 G IJ SOLR
INTRAMUSCULAR | Status: AC
  Filled 2018-08-30: qty 1.2

## 2018-08-30 MED ORDER — MIDAZOLAM HCL 2 MG/2ML IJ SOLN
INTRAMUSCULAR | Status: AC
  Filled 2018-08-30: qty 2

## 2018-08-30 MED ORDER — SODIUM CHLORIDE 0.9 % IV SOLN
INTRAVENOUS | Status: DC
  Administered 2018-08-30: 10:00:00 via INTRAVENOUS

## 2018-08-30 NOTE — Progress Notes (Signed)
Patient was scheduled for an image-guided L1 KP/VP tentatively for today with Dr. Estanislado Pandy.  Urinalysis today revealed leukocytes and WBCs still high- probable still with UTI. Discussed case with Dr. Estanislado Pandy who recommends postponing procedure until UTI treated due to infection risk. Will treat with Ciprofloxacin 250 mg taken twice daily x 4 days and recheck UA Tuesday. If UA trending closer to normal, plan for procedure Tuesday at 0830 am. Informed patient of above, pre-procedure handout and prescription for Ciprofloxacin given to patient. Informed patient that she must begin taking this antibiotic today if she wants procedure done Tuesday. All questions answered and concerns addressed. Patient conveys understanding and agrees with plan.  Please call IR with questions/concerns.   Bea Graff Louk, PA-C 08/30/2018, 11:42 AM

## 2018-08-30 NOTE — Patient Outreach (Signed)
Telephone assessment for follow up COPD  Mrs. Bolon states she is doing very well. She denies any SOB today. She is wearing her O2 continuously. She reports her pulse oxygen readings have been between 87%-97%. She is familiar with her COPD Action Plan. She has met her care plan goals today.   [facility] CM Care Plan Problem One     Most Recent Value  Care Plan Problem One  Knowledge deficit related to COPD  Role Documenting the Problem One  Care Management Coordinator  Care Plan for Problem One  Active  [facility] Long Term Goal   Pt will verbalize improved self care for COPD within 60 days.  [facility] Long Term Goal Start Date  08/23/18 [goal re-established]  [facility] Long Term Goal Met Date  08/30/18  Interventions for Problem One Long Term Goal  Reinforced need to follow the COPD Action Plan every day and report to MD when she is in the yellow zone.  [facility] CM Short Term Goal #1   Pt will verbalize COPD action plan/ zones within 30 days  [facility] CM Short Term Goal #1 Start Date  08/23/18 [goal re-established]  [facility] CM Short Term Goal #1 Met Date  08/30/18  Interventions for Short Term Goal #1  Pt is in the Green zone today and recognizes sxs that are in the yellow zone. Denies being in the red zone.  [facility] CM Short Term Goal #2   Pt will verbalize signs/ symptoms exacerbation of A-Fib/ A-flutter within 30 days.  [facility] CM Short Term Goal #2 Start Date  07/22/18 [goal re-established]  [facility] CM Short Term Goal #2 Met Date  08/23/18       Jacqlyn Larsen, RN to follow up one more time next week.  Eulah Pont. Myrtie Neither, MSN, [facility] Gerontological Nurse Practitioner [facility] Care Management [phone]

## 2018-08-30 NOTE — H&P (Signed)
Chief Complaint: Back pain  Referring Physician(s): Deveshwar,Sanjeev  Supervising Physician: Luanne Bras  Patient Status: [facility] - Out-pt  History of Present Illness: Eileen [name] is a 73 y.o. female who has been c/o low back since a previous hospital admission.  Per notes, there was a questionable fall in the ICU.  MRI showed L1 fracture.  Other medical issues included type 2 diabetes, chronic atrial flutter on Xarelto, and steroid-dependent COPD with chronic hypoxemia.  She is here today for kyphoplasty.  She has completed her course of antibiotics for recent UTI.  UA today shows improved. No nausea/vomiting. No Fever/chills. ROS negative.   Past Medical History:  Diagnosis Date   Asthma    Atrial flutter ([place])    Cancer ([place])    Right breast   COPD (chronic obstructive pulmonary disease) ([place])    History of breast cancer    right breast   Hypertension    On home O2    Type 2 diabetes mellitus ([place])     Past Surgical History:  Procedure Laterality Date   BREAST SURGERY  2011   Right breast mastectomy   CARDIOVERSION N/A 06/27/2018   Procedure: CARDIOVERSION;  Surgeon: Arnoldo Lenis, MD;  Location: [facility] SU[facility];  Service: Endoscopy;  Laterality: N/A;   CESAREAN SECTION     COLONOSCOPY N/A 01/22/2018   Procedure: COLONOSCOPY;  Surgeon: Rogene Houston, MD;  Location: [facility];  Service: Endoscopy;  Laterality: N/A;   HERNIA REPAIR     RIH   MASTECTOMY  2011   right breast   TEE WITHOUT CARDIOVERSION N/A 06/27/2018   Procedure: TRANSESOPHAGEAL ECHOCARDIOGRAM (TEE) WITH PROPOFOL;  Surgeon: Arnoldo Lenis, MD;  Location: [facility];  Service: Endoscopy;  Laterality: N/A;    Allergies: Codeine  Medications: Prior to Admission medications   Medication Sig Start Date End Date Taking? Authorizing Provider  acetaminophen (TYLENOL) 500 MG tablet Take 500 mg by mouth every 8 (eight) hours as needed for mild pain or  headache.    Yes Lavella Lemons, PA  albuterol (PROVENTIL) (2.5 MG/3ML) 0.083% nebulizer solution Take 2.5 mg by nebulization every 6 (six) hours as needed for wheezing or shortness of breath.   Yes [provider]  amiodarone (PACERONE) 200 MG tablet Take 1 tablet (200 mg total) by mouth daily. 07/20/18  Yes Strader, [place] M, PA-C  docusate sodium (COLACE) 100 MG capsule Take 1 capsule (100 mg total) by mouth 2 (two) times daily. Patient taking differently: Take 100 mg by mouth daily.  08/22/18  Yes Barton Dubois, MD  furosemide (LASIX) 40 MG tablet Take 40 mg by mouth daily.  07/10/18  Yes [provider]  guaiFENesin (MUCINEX) 600 MG 12 hr tablet Take 1 tablet (600 mg total) by mouth 2 (two) times daily. 08/22/18  Yes Barton Dubois, MD  metFORMIN (GLUCOPHAGE-XR) 500 MG 24 hr tablet Take 500 mg by mouth daily with breakfast.  08/10/17  Yes [provider]  metoprolol tartrate (LOPRESSOR) 50 MG tablet Take 50 mg by mouth 2 (two) times daily. 04/12/18  Yes [provider]  OXYGEN Inhale 2.5-3 L into the lungs continuous.    Yes [provider]  polyethylene glycol (MIRALAX / GLYCOLAX) 17 g packet Take 17 g by mouth daily as needed for mild constipation. 08/22/18  Yes Barton Dubois, MD  predniSONE (DELTASONE) 20 MG tablet Take 3 tablets daily x1 day; then 2 tablets daily x2-day; then 1 tablet daily x2  days; then half tablet daily x3 days and stop prednisone. 08/22/18  Yes Barton Dubois, MD  PROAIR HFA 108 [phone] BASE) MCG/ACT inhaler Inhale 2 puffs into the lungs every 6 (six) hours as needed for wheezing or shortness of breath.  03/06/11  Yes [provider]  rivaroxaban (XARELTO) 20 MG TABS tablet Take 1 tablet (20 mg total) by mouth daily with supper. Stop Xarelto until Monday (08/26/18) in anticipation for kyphoplasty 08/22/18  Yes Barton Dubois, MD  TRELEGY ELLIPTA 100-62.5-25 MCG/INH AEPB Take 1 puff by mouth daily. 08/27/17  Yes [provider]  blood glucose meter kit and supplies KIT Dispense based on patient and insurance preference. Use up to four times daily as directed. (FOR ICD-9 250.00, 250.01).  Check sugar before meals and at night, keep a log. 09/29/16   Arrien, Jimmy Picket, MD     Family History  Problem Relation Age of Onset   Cancer Mother        lung   Diabetes Brother     Social History   Socioeconomic History   Marital status: Single    Spouse name: Not on file   Number of children: Not on file   Years of education: Not on file   Highest education level: Not on file  Occupational History   Not on file  Social Needs   Financial resource strain: Not hard at all   Food insecurity    Worry: Never true    Inability: Never true   Transportation needs    Medical: Patient refused    Non-medical: Patient refused  Tobacco Use   Smoking status: Former Smoker    Packs/day: 0.50    Years: 32.00    Pack years: 16.00    Types: Cigarettes    Start date: 12/12/1962    Quit date: 03/13/1994    Years since quitting: 24.4   Smokeless tobacco: Never Used  Substance and Sexual Activity   Alcohol use: No    Alcohol/week: 0.0 standard drinks   Drug use: No   Sexual activity: Not on file  Lifestyle   Physical activity    Days per week: Patient refused    Minutes per session: Patient refused   Stress: Not at all  Relationships   Social connections    Talks on phone: Three times a week    Gets together: Three times a week    Attends religious service: More than 4 times per year    Active member of club or organization: Yes    Attends meetings of clubs or organizations: More than 4 times per year    Relationship status: Never married  Other Topics Concern   Not on file  Social History Narrative   Not on file     Review of Systems: A 12 point ROS discussed and pertinent positives are indicated in the HPI above.  All other systems are negative.  Review of  Systems  Vital Signs: BP 138/78    Pulse 83    Temp 97.8 F (36.6 C) (Skin)    Resp 20    Ht 5' 2" (1.575 m)    Wt 78.9 kg    SpO2 94%    BMI 31.83 kg/m   Physical Exam Vitals signs reviewed.  Constitutional:      Appearance: She is obese.  HENT:     Head: Normocephalic and atraumatic.  Neck:     Musculoskeletal: Normal range of motion.  Cardiovascular:     Rate and  Rhythm: Normal rate and regular rhythm.  Pulmonary:     Effort: Pulmonary effort is normal.     Comments: Decreased breath sounds secondary to COPD. No wheezing, no rhonchi. Abdominal:     Palpations: Abdomen is soft.  Musculoskeletal:     Lumbar back: She exhibits tenderness, bony tenderness and pain.       Back:  Skin:    General: Skin is warm and dry.  Neurological:     General: No focal deficit present.     Mental Status: She is alert and oriented to person, place, and time.  Psychiatric:        Mood and Affect: Mood normal.        Behavior: Behavior normal.        Thought Content: Thought content normal.        Judgment: Judgment normal.     Imaging: Ct Abdomen Pelvis Wo Contrast  Result Date: 08/20/2018 CLINICAL DATA:  Abdominal bloating and constipation.  Hypoxia. EXAM: CT ABDOMEN AND PELVIS WITHOUT CONTRAST TECHNIQUE: Multidetector CT imaging of the abdomen and pelvis was performed following the standard protocol without IV contrast. COMPARISON:  Abdominal x-rays dated August 13, 2018. Abdominal ultrasound dated February 21, 2016. FINDINGS: Lower chest: No acute abnormality.  Bibasilar atelectasis. Hepatobiliary: No focal liver abnormality is seen. No gallstones, gallbladder wall thickening, or biliary dilatation. Pancreas: Unremarkable. No pancreatic ductal dilatation or surrounding inflammatory changes. Spleen: Normal in size without focal abnormality. Adrenals/Urinary Tract: The adrenal glands are unremarkable. Small bilateral renal cysts. Punctate nonobstructive right renal calculi. 6 mm nonobstructive  left renal calculus. No hydronephrosis. The bladder is unremarkable. Stomach/Bowel: Stomach is within normal limits. Appendix appears normal. No evidence of bowel wall thickening, distention, or inflammatory changes. Diverticulosis of the descending colon. Large amount of stool in the right colon. Vascular/Lymphatic: Aortic atherosclerosis. No enlarged abdominal or pelvic lymph nodes. Reproductive: Uterus and bilateral adnexa are unremarkable. Other: No abdominal wall hernia or abnormality. No abdominopelvic ascites. No pneumoperitoneum. Musculoskeletal: New age-indeterminate moderate L1 compression fracture with 7 mm retropulsion, not present in April. No surrounding paravertebral hematoma. IMPRESSION: 1.  No acute intra-abdominal process. 2. Bilateral nonobstructive nephrolithiasis. 3. New age-indeterminate moderate L1 compression fracture with 7 mm retropulsion, not present in April. Lack of significant surrounding paravertebral hematoma suggests the fracture is chronic. Correlate with point tenderness. 4.  Aortic atherosclerosis (ICD10-I70.0). Electronically Signed   By: Titus Dubin M.D.   On: 08/20/2018 14:12   Dg Chest 2 View  Result Date: 08/20/2018 CLINICAL DATA:  Shortness of breath. Recently discharged from the hospital for pneumonia. EXAM: CHEST - 2 VIEW COMPARISON:  Chest x-rays dated August 15, 2018, and June 24, 2010. FINDINGS: Stable cardiomediastinal silhouette with enlargement of the main pulmonary artery. Normal pulmonary vascularity. Emphysematous changes again noted. Similar-appearing bibasilar atelectasis. No focal consolidation, pleural effusion, or pneumothorax. Age indeterminate severe L1 compression fracture, not present in April. IMPRESSION: 1. No active disease.  COPD and bibasilar atelectasis. 2. Age-indeterminate severe L1 compression fracture, not present in April. Correlate with point tenderness. Electronically Signed   By: Titus Dubin M.D.   On: 08/20/2018 14:05   Mr Lumbar  Spine Wo Contrast  Result Date: 08/21/2018 CLINICAL DATA:  Low back pain EXAM: MRI LUMBAR SPINE WITHOUT CONTRAST TECHNIQUE: Multiplanar, multisequence MR imaging of the lumbar spine was performed. No intravenous contrast was administered. COMPARISON:  CT abdomen pelvis 08/20/2018 FINDINGS: Segmentation:  Normal Alignment:  Normal Vertebrae: There is a subacute compression fracture of L1 with a osteonecrotic  cleft underlying the superior endplate. There is approximately 50% height loss with retropulsion of the posterosuperior corner measuring 6 mm. There is no other marrow abnormality. Conus medullaris and cauda equina: Conus extends to the L1 level. Conus and cauda equina appear normal. Paraspinal and other soft tissues: Bilateral renal cysts. Disc levels: T11-12: Normal. T12-L1: Normal disc space. Retropulsion from the L1 compression fracture mildly narrows the spinal canal. L1-2: Normal disc space. L2-3: Normal. L3-4: Normal. L4-5: Normal disc space. Mild right facet hypertrophy. No stenosis. L5-S1: Mild disc space narrowing without spinal canal or neural foraminal stenosis. IMPRESSION: 1. Subacute compression fracture of L1 with 50% height loss and 6 mm retropulsion. Mild spinal canal stenosis. 2. Mild lumbar degenerative disc disease without spinal canal or neural foraminal stenosis otherwise. Electronically Signed   By: Ulyses Jarred M.D.   On: 08/21/2018 18:01   Dg Chest Port 1 View  Result Date: 08/15/2018 CLINICAL DATA:  Shortness of breath. EXAM: PORTABLE CHEST 1 VIEW COMPARISON:  Radiographs of August 13, 2018. FINDINGS: Stable cardiomediastinal silhouette. No pneumothorax is noted. Right lung is clear. Minimal left basilar atelectasis is noted with possible associated pleural effusion. Bony thorax is unremarkable. IMPRESSION: Minimal left basilar subsegmental atelectasis is noted with minimal left pleural effusion. Electronically Signed   By: Marijo Conception M.D.   On: 08/15/2018 09:48   Dg Chest Port  1 View  Result Date: 08/10/2018 CLINICAL DATA:  Short of breath EXAM: PORTABLE CHEST 1 VIEW COMPARISON:  06/24/2018 FINDINGS: Upper normal heart size. Low lung volumes. Subsegmental atelectasis at the right base. Hazy airspace disease at the left base. Upper lungs clear. No pneumothorax. IMPRESSION: Left basilar consolidation. Followup PA and lateral chest X-ray is recommended in 3-4 weeks following trial of antibiotic therapy to ensure resolution and exclude underlying malignancy. Electronically Signed   By: Marybelle Killings M.D.   On: 08/10/2018 13:47   Dg Abd Acute 2+v W 1v Chest  Result Date: 08/13/2018 CLINICAL DATA:  Confusion, abdominal distension EXAM: DG ABDOMEN ACUTE W/ 1V CHEST COMPARISON:  Chest radiograph 08/10/2018 FINDINGS: Enlargement of cardiac silhouette. Mediastinal contours and pulmonary vascularity normal. Lordotic positioning. Bibasilar atelectasis and questionable small bibasilar effusions. No definite infiltrate or pneumothorax. Bones demineralized. Nonobstructive bowel gas pattern. Increased stool in RIGHT colon. No bowel dilatation, bowel wall thickening or free air. No definite urinary tract calcifications. IMPRESSION: Nonobstructive bowel gas pattern with increased stool in RIGHT colon. Enlargement of cardiac silhouette. Bibasilar atelectasis and questionable small pleural effusions. Electronically Signed   By: Lavonia Dana M.D.   On: 08/13/2018 10:10    Labs:  CBC: Recent Labs    08/20/18 1228 08/21/18 0502 08/22/18 0637 08/27/18 1054  WBC 13.4* 17.0* 18.1* 15.9*  HGB 11.8* 11.1* 12.2 11.2*  HCT 38.6 36.2 41.5 38.3  PLT 276 268 298 313    COAGS: Recent Labs    01/20/18 0609  08/20/18 1228 08/21/18 0502 08/21/18 1751 08/22/18 0637 08/22/18 1001 08/27/18 1054  INR 1.45  --  1.0  --   --   --   --  1.0  APTT  --    < > <20* 26 38* >200* 81*  --    < > = values in this interval not displayed.    BMP: Recent Labs    08/18/18 0606 08/20/18 1228  08/21/18 0502 08/22/18 0637  NA 141 141 142 139  K 3.8 4.4 4.2 4.3  CL 88* 92* 92* 88*  CO2 40* 34* 37* 35*  GLUCOSE  214* 411* 205* 132*  BUN 31* 31* 27* 25*  CALCIUM 10.0 9.6 9.3 9.6  CREATININE 0.68 0.73 0.67 0.67  GFRNONAA >60 >60 >60 >60  GFRAA >60 >60 >60 >60    LIVER FUNCTION TESTS: Recent Labs    08/11/18 0359 08/12/18 0436 08/13/18 0506 08/20/18 1228  BILITOT 0.6 0.5 0.5 0.8  AST _0 ALT 22 26 37 27  ALKPHOS 46 46 51 68  PROT 6.4* 6.2* 6.1* 6.3*  ALBUMIN 3.8 3.6 3.7 3.7    TUMOR MARKERS: No results for input(s): AFPTM, CEA, CA199, CHROMGRNA in the last 8760 hours.  Assessment and Plan:  L1 compression fracture.  Will proceed with kyphoplasty today by Dr. Estanislado Pandy.  Risks and benefits of L1 kyphoplasty were discussed with the patient including, but not limited to education regarding the natural healing process of compression fractures without intervention, bleeding, infection, cement migration which may cause spinal cord damage, paralysis, pulmonary embolism or even death.  This interventional procedure involves the use of X-rays and because of the nature of the planned procedure, it is possible that we will have prolonged use of X-ray fluoroscopy.  Potential radiation risks to you include (but are not limited to) the following: - A slightly elevated risk for cancer  several years later in life. This risk is typically less than 0.5% percent. This risk is low in comparison to the normal incidence of human cancer, which is 33% for women and 50% for men according to the [place]. - Radiation induced injury can include skin redness, resembling a rash, tissue breakdown / ulcers and hair loss (which can be temporary or permanent).   The likelihood of either of these occurring depends on the difficulty of the procedure and whether you are sensitive to radiation due to previous procedures, disease, or genetic conditions.   IF your procedure  requires a prolonged use of radiation, you will be notified and given written instructions for further action.  It is your responsibility to monitor the irradiated area for the 2 weeks following the procedure and to notify your physician if you are concerned that you have suffered a radiation induced injury.    All of the patient's questions were answered, patient is agreeable to proceed.  Consent signed and in chart.  Patient has difficult IV access on the left arm/hand. She has h/o mastectomy on the right.   Per published Editorial in Breast Cancer Management, Vol. 7, No. 3, ".... If possible, venepuncture should be performed on the contralateral arm but where this is not easily achieved, in the absence of lymphedema, it is preferable to consider venepuncture or siting a venous access cannula in the ipsilateral arm rather than making further attempts in the contralateral arm..."  Ms.Chumley has no lymphedema and appears to have adequate veins in the right hand. I have given nurse permission to attempt IV start in the right hand. If unsuccessful, we may need to attempt micropuncture access in the basilic/brachial vein of the left arm under US guidance. Thank you for this interesting consult.  I greatly enjoyed meeting Eileen [name] and look forward to participating in their care.  A copy of this report was sent to the requesting provider on this date.  Electronically Signed: Murrell Redden, PA-C   08/30/2018, 9:49 AM      I spent a total of  30 Minutes   in face to face in clinical consultation, greater than 50% of which was counseling/coordinating care for  L1 kyphoplasty.

## 2018-08-30 NOTE — Progress Notes (Signed)
Gareth Eagle, PA to room and made aware that RN unable to obtain IV on left arm.  Abigail Butts ok with starting an IV on right hand (right arm restriction) and patient on with that plan.    RN started IV on right hand but unable to obtain blood for labs.  Precious Bard, RN in radiology made aware.

## 2018-08-31 DIAGNOSIS — J9621 Acute and chronic respiratory failure with hypoxia: Secondary | ICD-10-CM | POA: Diagnosis not present

## 2018-08-31 DIAGNOSIS — Z9981 Dependence on supplemental oxygen: Secondary | ICD-10-CM | POA: Diagnosis not present

## 2018-08-31 DIAGNOSIS — J440 Chronic obstructive pulmonary disease with acute lower respiratory infection: Secondary | ICD-10-CM | POA: Diagnosis not present

## 2018-08-31 DIAGNOSIS — Z9181 History of falling: Secondary | ICD-10-CM | POA: Diagnosis not present

## 2018-08-31 DIAGNOSIS — Z7901 Long term (current) use of anticoagulants: Secondary | ICD-10-CM | POA: Diagnosis not present

## 2018-08-31 DIAGNOSIS — Z853 Personal history of malignant neoplasm of breast: Secondary | ICD-10-CM | POA: Diagnosis not present

## 2018-08-31 DIAGNOSIS — I10 Essential (primary) hypertension: Secondary | ICD-10-CM | POA: Diagnosis not present

## 2018-08-31 DIAGNOSIS — J189 Pneumonia, unspecified organism: Secondary | ICD-10-CM | POA: Diagnosis not present

## 2018-08-31 DIAGNOSIS — Z7952 Long term (current) use of systemic steroids: Secondary | ICD-10-CM | POA: Diagnosis not present

## 2018-08-31 DIAGNOSIS — E119 Type 2 diabetes mellitus without complications: Secondary | ICD-10-CM | POA: Diagnosis not present

## 2018-08-31 DIAGNOSIS — I4892 Unspecified atrial flutter: Secondary | ICD-10-CM | POA: Diagnosis not present

## 2018-08-31 DIAGNOSIS — Z9011 Acquired absence of right breast and nipple: Secondary | ICD-10-CM | POA: Diagnosis not present

## 2018-08-31 DIAGNOSIS — Z7984 Long term (current) use of oral hypoglycemic drugs: Secondary | ICD-10-CM | POA: Diagnosis not present

## 2018-09-03 ENCOUNTER — Ambulatory Visit (HOSPITAL_COMMUNITY): Payer: Medicare Other

## 2018-09-03 DIAGNOSIS — I4892 Unspecified atrial flutter: Secondary | ICD-10-CM | POA: Diagnosis not present

## 2018-09-03 DIAGNOSIS — J449 Chronic obstructive pulmonary disease, unspecified: Secondary | ICD-10-CM | POA: Diagnosis not present

## 2018-09-03 DIAGNOSIS — S32010A Wedge compression fracture of first lumbar vertebra, initial encounter for closed fracture: Secondary | ICD-10-CM | POA: Diagnosis not present

## 2018-09-03 DIAGNOSIS — I10 Essential (primary) hypertension: Secondary | ICD-10-CM | POA: Diagnosis not present

## 2018-09-03 DIAGNOSIS — I480 Paroxysmal atrial fibrillation: Secondary | ICD-10-CM | POA: Diagnosis not present

## 2018-09-04 ENCOUNTER — Other Ambulatory Visit: Payer: Self-pay | Admitting: Student

## 2018-09-04 ENCOUNTER — Other Ambulatory Visit: Payer: Self-pay | Admitting: Radiology

## 2018-09-04 DIAGNOSIS — Z853 Personal history of malignant neoplasm of breast: Secondary | ICD-10-CM | POA: Diagnosis not present

## 2018-09-04 DIAGNOSIS — Z9181 History of falling: Secondary | ICD-10-CM | POA: Diagnosis not present

## 2018-09-04 DIAGNOSIS — Z7984 Long term (current) use of oral hypoglycemic drugs: Secondary | ICD-10-CM | POA: Diagnosis not present

## 2018-09-04 DIAGNOSIS — J9621 Acute and chronic respiratory failure with hypoxia: Secondary | ICD-10-CM | POA: Diagnosis not present

## 2018-09-04 DIAGNOSIS — Z7901 Long term (current) use of anticoagulants: Secondary | ICD-10-CM | POA: Diagnosis not present

## 2018-09-04 DIAGNOSIS — Z7952 Long term (current) use of systemic steroids: Secondary | ICD-10-CM | POA: Diagnosis not present

## 2018-09-04 DIAGNOSIS — Z9981 Dependence on supplemental oxygen: Secondary | ICD-10-CM | POA: Diagnosis not present

## 2018-09-04 DIAGNOSIS — E119 Type 2 diabetes mellitus without complications: Secondary | ICD-10-CM | POA: Diagnosis not present

## 2018-09-04 DIAGNOSIS — I4892 Unspecified atrial flutter: Secondary | ICD-10-CM | POA: Diagnosis not present

## 2018-09-04 DIAGNOSIS — Z9011 Acquired absence of right breast and nipple: Secondary | ICD-10-CM | POA: Diagnosis not present

## 2018-09-04 DIAGNOSIS — J189 Pneumonia, unspecified organism: Secondary | ICD-10-CM | POA: Diagnosis not present

## 2018-09-04 DIAGNOSIS — J440 Chronic obstructive pulmonary disease with acute lower respiratory infection: Secondary | ICD-10-CM | POA: Diagnosis not present

## 2018-09-04 DIAGNOSIS — I10 Essential (primary) hypertension: Secondary | ICD-10-CM | POA: Diagnosis not present

## 2018-09-05 ENCOUNTER — Other Ambulatory Visit (HOSPITAL_COMMUNITY): Payer: Self-pay | Admitting: Interventional Radiology

## 2018-09-05 ENCOUNTER — Other Ambulatory Visit: Payer: Self-pay

## 2018-09-05 ENCOUNTER — Encounter (HOSPITAL_COMMUNITY): Payer: Self-pay

## 2018-09-05 ENCOUNTER — Ambulatory Visit (HOSPITAL_COMMUNITY)
Admission: RE | Admit: 2018-09-05 | Discharge: 2018-09-05 | Disposition: A | Discharge status: Home / Self Care | Payer: Medicare Other | Source: Ambulatory Visit | Attending: Interventional Radiology | Admitting: Interventional Radiology

## 2018-09-05 DIAGNOSIS — S32010A Wedge compression fracture of first lumbar vertebra, initial encounter for closed fracture: Secondary | ICD-10-CM | POA: Diagnosis not present

## 2018-09-05 DIAGNOSIS — Z79899 Other long term (current) drug therapy: Secondary | ICD-10-CM | POA: Diagnosis not present

## 2018-09-05 DIAGNOSIS — I4892 Unspecified atrial flutter: Secondary | ICD-10-CM | POA: Diagnosis not present

## 2018-09-05 DIAGNOSIS — E119 Type 2 diabetes mellitus without complications: Secondary | ICD-10-CM | POA: Diagnosis not present

## 2018-09-05 DIAGNOSIS — Z7901 Long term (current) use of anticoagulants: Secondary | ICD-10-CM | POA: Insufficient documentation

## 2018-09-05 DIAGNOSIS — J449 Chronic obstructive pulmonary disease, unspecified: Secondary | ICD-10-CM | POA: Insufficient documentation

## 2018-09-05 DIAGNOSIS — Z9981 Dependence on supplemental oxygen: Secondary | ICD-10-CM | POA: Diagnosis not present

## 2018-09-05 DIAGNOSIS — Z7984 Long term (current) use of oral hypoglycemic drugs: Secondary | ICD-10-CM | POA: Diagnosis not present

## 2018-09-05 DIAGNOSIS — I10 Essential (primary) hypertension: Secondary | ICD-10-CM | POA: Insufficient documentation

## 2018-09-05 DIAGNOSIS — Z853 Personal history of malignant neoplasm of breast: Secondary | ICD-10-CM | POA: Insufficient documentation

## 2018-09-05 DIAGNOSIS — Z87891 Personal history of nicotine dependence: Secondary | ICD-10-CM | POA: Diagnosis not present

## 2018-09-05 DIAGNOSIS — X58XXXA Exposure to other specified factors, initial encounter: Secondary | ICD-10-CM | POA: Diagnosis not present

## 2018-09-05 HISTORY — PX: IR VERTEBROPLASTY LUMBAR BX INC UNI/BIL INC/INJECT/IMAGING: IMG5516

## 2018-09-05 LAB — URINALYSIS, COMPLETE (UACMP) WITH MICROSCOPIC
Bilirubin Urine: NEGATIVE
Glucose, UA: >=500 mg/dL — AB
Hgb urine dipstick: NEGATIVE
Ketones, ur: 5 mg/dL — AB
Leukocytes,Ua: NEGATIVE
Nitrite: NEGATIVE
Protein, ur: 100 mg/dL — AB
Specific Gravity, Urine: 1.032 — ABNORMAL HIGH (ref 1.005–1.030)
pH: 5 (ref 5.0–8.0)

## 2018-09-05 LAB — CBC
HCT: 39.8 % (ref 36.0–46.0)
Hemoglobin: 12 g/dL (ref 12.0–15.0)
MCH: 29.3 pg (ref 26.0–34.0)
MCHC: 30.2 g/dL (ref 30.0–36.0)
MCV: 97.1 fL (ref 80.0–100.0)
Platelets: 331 10*3/uL (ref 150–400)
RBC: 4.1 MIL/uL (ref 3.87–5.11)
RDW: 14.3 % (ref 11.5–15.5)
WBC: 14 10*3/uL — ABNORMAL HIGH (ref 4.0–10.5)
nRBC: 0 % (ref 0.0–0.2)

## 2018-09-05 LAB — GLUCOSE, CAPILLARY
Glucose-Capillary: 247 mg/dL — ABNORMAL HIGH (ref 70–99)
Glucose-Capillary: 241 mg/dL — ABNORMAL HIGH (ref 70–99)

## 2018-09-05 MED ORDER — IOHEXOL 300 MG/ML  SOLN
50.0000 mL | Freq: Once | INTRAMUSCULAR | Status: AC | PRN
  Administered 2018-09-05: 3 mL via INTRAVENOUS

## 2018-09-05 MED ORDER — CEFAZOLIN SODIUM-DEXTROSE 2-4 GM/100ML-% IV SOLN
2.0000 g | INTRAVENOUS | Status: AC
  Administered 2018-09-05: 2 g via INTRAVENOUS

## 2018-09-05 MED ORDER — SODIUM CHLORIDE 0.9 % IV SOLN: INTRAVENOUS | Status: DC

## 2018-09-05 MED ORDER — MIDAZOLAM HCL 2 MG/2ML IJ SOLN
INTRAMUSCULAR | Status: AC
  Filled 2018-09-05: qty 2

## 2018-09-05 MED ORDER — HYDROMORPHONE HCL 1 MG/ML IJ SOLN
INTRAMUSCULAR | Status: AC
  Filled 2018-09-05: qty 1

## 2018-09-05 MED ORDER — MIDAZOLAM HCL 2 MG/2ML IJ SOLN
INTRAMUSCULAR | Status: AC | PRN
  Administered 2018-09-05: 1 mg via INTRAVENOUS

## 2018-09-05 MED ORDER — FENTANYL CITRATE (PF) 100 MCG/2ML IJ SOLN
INTRAMUSCULAR | Status: AC
  Filled 2018-09-05: qty 2

## 2018-09-05 MED ORDER — HYDROCODONE-ACETAMINOPHEN 5-325 MG PO TABS
1.0000 | ORAL_TABLET | Freq: Once | ORAL | Status: AC
  Administered 2018-09-05: 1 via ORAL

## 2018-09-05 MED ORDER — SODIUM CHLORIDE 0.9 % IV SOLN: INTRAVENOUS | Status: AC

## 2018-09-05 MED ORDER — BUPIVACAINE HCL (PF) 0.5 % IJ SOLN
INTRAMUSCULAR | Status: AC
  Filled 2018-09-05: qty 30

## 2018-09-05 MED ORDER — BUPIVACAINE HCL (PF) 0.5 % IJ SOLN
INTRAMUSCULAR | Status: AC | PRN
  Administered 2018-09-05: 20 mL

## 2018-09-05 MED ORDER — FENTANYL CITRATE (PF) 100 MCG/2ML IJ SOLN
INTRAMUSCULAR | Status: AC | PRN
  Administered 2018-09-05: 25 ug via INTRAVENOUS

## 2018-09-05 MED ORDER — TOBRAMYCIN SULFATE 1.2 G IJ SOLR
INTRAMUSCULAR | Status: AC
  Filled 2018-09-05: qty 1.2

## 2018-09-05 MED ORDER — HYDROCODONE-ACETAMINOPHEN 5-325 MG PO TABS
ORAL_TABLET | ORAL | Status: AC
  Filled 2018-09-05: qty 1

## 2018-09-05 MED ORDER — CEFAZOLIN SODIUM-DEXTROSE 2-4 GM/100ML-% IV SOLN
INTRAVENOUS | Status: AC
  Filled 2018-09-05: qty 100

## 2018-09-05 NOTE — Progress Notes (Signed)
Pt unable to void at this time Jannifer Franklin, PA informed

## 2018-09-05 NOTE — Progress Notes (Signed)
In and out cath complete urine was dark yellow and cloudy in appearance

## 2018-09-05 NOTE — Progress Notes (Signed)
Discharge instructions reviewed with pt daughter (via telephone). Voices understanding.   

## 2018-09-05 NOTE — Progress Notes (Addendum)
Ice pack placed to lower back. Attempted to call pt daughter to go over d/c instructions message left for her to call us back.

## 2018-09-05 NOTE — Discharge Instructions (Addendum)
Balloon Kyphoplasty, Care After Refer to this sheet in the next few weeks. These instructions provide you with information about caring for yourself after your procedure. Your health care provider may also give you more specific instructions. Your treatment has been planned according to current medical practices, but problems sometimes occur. Call your health care provider if you have any problems or questions after your procedure. What can I expect after the procedure? After your procedure, it is common to have back pain. Follow these instructions at home: Incision care  Follow instructions from your health care provider about how to take care of your incisions. Make sure you: ? Wash your hands with soap and water before you change your bandage (dressing). If soap and water are not available, use hand sanitizer. ? Change your dressing as told by your health care provider. ? Leave stitches (sutures), skin glue, or adhesive strips in place. These skin closures may need to be in place for 2 weeks or longer. If adhesive strip edges start to loosen and curl up, you may trim the loose edges. Do not remove adhesive strips completely unless your health care provider tells you to do that.  Check your incision area every day for signs of infection. Watch for: ? Redness, swelling, or pain. ? Fluid, blood, or pus.  Keep your dressing dry until your health care provider says that it can be removed. Activity   Rest your back and avoid intense physical activity for as long as told by your health care provider.  Return to your normal activities as told by your health care provider. Ask your health care provider what activities are safe for you.  Do not lift anything that is heavier than 10 lb (4.5 kg). This is about the weight of a gallon of milk.You may need to avoid heavy lifting for several weeks. General instructions  Take over-the-counter and prescription medicines only as told by your health care  provider.  If directed, apply ice to the painful area: ? Put ice in a plastic bag. ? Place a towel between your skin and the bag. ? Leave the ice on for 20 minutes, 2-3 times per day.  Do not use tobacco products, including cigarettes, chewing tobacco, or e-cigarettes. If you need help quitting, ask your health care provider.  Keep all follow-up visits as told by your health care provider. This is important. Contact a health care provider if:  You have a fever.  You have redness, swelling, or pain at the site of your incisions.  You have fluid, blood, or pus coming from your incisions.  You have pain that gets worse or does not get better with medicine.  You develop numbness or weakness in any part of your body. Get help right away if:  You have chest pain.  You have difficulty breathing.  You cannot move your legs.  You cannot control your bladder or bowel movements.  You suddenly become weak or numb on one side of your body.  You become very confused.  You have trouble speaking or understanding, or both. This information is not intended to replace advice given to you by your health care provider. Make sure you discuss any questions you have with your health care provider. Document Released: 11/18/2014 Document Revised: 08/05/2015 Document Reviewed: 06/22/2014 Elsevier Interactive Patient Education  2019 [place].   1. No stooping,or bending or lifting more than 10 lbs for 2 weeks. 2.use walker to ambulate for 2 weeks . 3. No driving for  2 weeks. 4.RTC 2 to 3 weeks PRN

## 2018-09-05 NOTE — Procedures (Signed)
S/P L1 VP 

## 2018-09-05 NOTE — H&P (Signed)
Chief Complaint: Patient was seen in consultation today for Lumbar 1 KP at the request Dr Reino Bellis  Supervising Physician: Luanne Bras  Patient Status: [facility] - Out-pt  History of Present Illness: Eileen [name] is a 73 y.o. female   Was seen as IP at [facility] for SOB/COPD exacerbation Noted was acute back pain  MRI 08/21/18:  IMPRESSION: 1. Subacute compression fracture of L1 with 50% height loss and 6 mm retropulsion. Mild spinal canal stenosis. 2. Mild lumbar degenerative disc disease without spinal canal or neural foraminal stenosis otherwise.  Request made for Kyphoplasty Imaging reviewed and approved with Dr Estanislado Pandy Now scheduled as OP for L1KP  Pt was originally scheduled 6/16 but noted was UTI Cipro 250 mg BID x 3 days Rescheduled to 6/19: UTI still present Another Cipro 250 mg BID x 4 more days  Now scheduled for today UA pending Pt feels fine Denies urinary symptoms   Past Medical History:  Diagnosis Date   Asthma    Atrial flutter ([place])    Cancer ([place])    Right breast   COPD (chronic obstructive pulmonary disease) ([place])    History of breast cancer    right breast   Hypertension    On home O2    Type 2 diabetes mellitus ([place])     Past Surgical History:  Procedure Laterality Date   BREAST SURGERY  2011   Right breast mastectomy   CARDIOVERSION N/A 06/27/2018   Procedure: CARDIOVERSION;  Surgeon: Arnoldo Lenis, MD;  Location: [facility];  Service: Endoscopy;  Laterality: N/A;   CESAREAN SECTION     COLONOSCOPY N/A 01/22/2018   Procedure: COLONOSCOPY;  Surgeon: Rogene Houston, MD;  Location: [facility];  Service: Endoscopy;  Laterality: N/A;   HERNIA REPAIR     RIH   MASTECTOMY  2011   right breast   TEE WITHOUT CARDIOVERSION N/A 06/27/2018   Procedure: TRANSESOPHAGEAL ECHOCARDIOGRAM (TEE) WITH PROPOFOL;  Surgeon: Arnoldo Lenis, MD;  Location: [facility];  Service: Endoscopy;  Laterality: N/A;     Allergies: Codeine  Medications: Prior to Admission medications   Medication Sig Start Date End Date Taking? Authorizing Provider  acetaminophen (TYLENOL) 500 MG tablet Take 500 mg by mouth every 8 (eight) hours as needed for mild pain or headache.    Yes Lavella Lemons, PA  albuterol (PROVENTIL) (2.5 MG/3ML) 0.083% nebulizer solution Take 2.5 mg by nebulization every 6 (six) hours as needed for wheezing or shortness of breath.   Yes [provider]  amiodarone (PACERONE) 200 MG tablet Take 1 tablet (200 mg total) by mouth daily. 07/20/18  Yes Strader, [place] M, PA-C  docusate sodium (COLACE) 100 MG capsule Take 1 capsule (100 mg total) by mouth 2 (two) times daily. Patient taking differently: Take 100 mg by mouth daily.  08/22/18  Yes Barton Dubois, MD  furosemide (LASIX) 40 MG tablet Take 40 mg by mouth daily.  07/10/18  Yes [provider]  guaiFENesin (MUCINEX) 600 MG 12 hr tablet Take 1 tablet (600 mg total) by mouth 2 (two) times daily. 08/22/18  Yes Barton Dubois, MD  metFORMIN (GLUCOPHAGE-XR) 500 MG 24 hr tablet Take 500 mg by mouth daily with breakfast.  08/10/17  Yes [provider]  metoprolol tartrate (LOPRESSOR) 50 MG tablet Take 50 mg by mouth 2 (two) times daily. 04/12/18  Yes [provider]  OXYGEN Inhale 2.5-3 L into the lungs continuous.    Yes [provider]  polyethylene glycol (MIRALAX / GLYCOLAX) 17 g packet Take 17 g by mouth daily as needed for mild constipation. 08/22/18  Yes Barton Dubois, MD  predniSONE (DELTASONE) 10 MG tablet Take 10 mg by mouth daily with breakfast.   Yes [provider]  predniSONE (DELTASONE) 20 MG tablet Take 3 tablets daily x1 day; then 2 tablets daily x2-day; then 1 tablet daily x2 days; then half tablet daily x3 days and stop prednisone. 08/22/18  Yes Barton Dubois, MD  PROAIR HFA 108 [phone] BASE) MCG/ACT inhaler Inhale 2 puffs into the lungs every 6 (six) hours as needed for wheezing or  shortness of breath.  03/06/11  Yes [provider]  TRELEGY ELLIPTA 100-62.5-25 MCG/INH AEPB Take 1 puff by mouth daily. 08/27/17  Yes [provider]  blood glucose meter kit and supplies KIT Dispense based on patient and insurance preference. Use up to four times daily as directed. (FOR ICD-9 250.00, 250.01).  Check sugar before meals and at night, keep a log. 09/29/16   Arrien, Jimmy Picket, MD  rivaroxaban (XARELTO) 20 MG TABS tablet Take 1 tablet (20 mg total) by mouth daily with supper. Stop Xarelto until Monday (08/26/18) in anticipation for kyphoplasty 08/22/18   Barton Dubois, MD     Family History  Problem Relation Age of Onset   Cancer Mother        lung   Diabetes Brother     Social History   Socioeconomic History   Marital status: Single    Spouse name: Not on file   Number of children: Not on file   Years of education: Not on file   Highest education level: Not on file  Occupational History   Not on file  Social Needs   Financial resource strain: Not hard at all   Food insecurity    Worry: Never true    Inability: Never true   Transportation needs    Medical: Patient refused    Non-medical: Patient refused  Tobacco Use   Smoking status: Former Smoker    Packs/day: 0.50    Years: 32.00    Pack years: 16.00    Types: Cigarettes    Start date: 12/12/1962    Quit date: 03/13/1994    Years since quitting: 24.4   Smokeless tobacco: Never Used  Substance and Sexual Activity   Alcohol use: No    Alcohol/week: 0.0 standard drinks   Drug use: No   Sexual activity: Not on file  Lifestyle   Physical activity    Days per week: Patient refused    Minutes per session: Patient refused   Stress: Not at all  Relationships   Social connections    Talks on phone: Three times a week    Gets together: Three times a week    Attends religious service: More than 4 times per year    Active member of club or organization: Yes    Attends  meetings of clubs or organizations: More than 4 times per year    Relationship status: Never married  Other Topics Concern   Not on file  Social History Narrative   Not on file    Review of Systems: A 12 point ROS discussed and pertinent positives are indicated in the HPI above.  All other systems are negative.  Review of Systems  Constitutional: Positive for activity change. Negative for appetite change and fever.  Respiratory: Positive for shortness of breath.   Cardiovascular: Negative for chest pain.  Gastrointestinal: Negative for  abdominal pain.  Genitourinary: Negative for difficulty urinating, dysuria, flank pain, frequency and urgency.  Musculoskeletal: Positive for back pain.  Neurological: Negative for weakness.  Psychiatric/Behavioral: Negative for behavioral problems and confusion.    Vital Signs: BP 136/71    Pulse 79    Temp 97.7 F (36.5 C) (Oral)    Ht _0  (1.575 m)    Wt 174 lb (78.9 kg)    SpO2 (!) 71%    BMI 31.83 kg/m   Physical Exam Vitals signs reviewed.  Cardiovascular:     Rate and Rhythm: Normal rate and regular rhythm.     Heart sounds: Normal heart sounds.  Pulmonary:     Effort: Pulmonary effort is normal.     Breath sounds: Normal breath sounds.  Abdominal:     General: There is no distension.  Musculoskeletal: Normal range of motion.     Comments: Pain low back  Skin:    General: Skin is warm and dry.  Neurological:     Mental Status: She is alert and oriented to person, place, and time.  Psychiatric:        Mood and Affect: Mood normal.        Behavior: Behavior normal.        Thought Content: Thought content normal.        Judgment: Judgment normal.     Imaging: Ct Abdomen Pelvis Wo Contrast  Result Date: 08/20/2018 CLINICAL DATA:  Abdominal bloating and constipation.  Hypoxia. EXAM: CT ABDOMEN AND PELVIS WITHOUT CONTRAST TECHNIQUE: Multidetector CT imaging of the abdomen and pelvis was performed following the standard protocol  without IV contrast. COMPARISON:  Abdominal x-rays dated August 13, 2018. Abdominal ultrasound dated February 21, 2016. FINDINGS: Lower chest: No acute abnormality.  Bibasilar atelectasis. Hepatobiliary: No focal liver abnormality is seen. No gallstones, gallbladder wall thickening, or biliary dilatation. Pancreas: Unremarkable. No pancreatic ductal dilatation or surrounding inflammatory changes. Spleen: Normal in size without focal abnormality. Adrenals/Urinary Tract: The adrenal glands are unremarkable. Small bilateral renal cysts. Punctate nonobstructive right renal calculi. 6 mm nonobstructive left renal calculus. No hydronephrosis. The bladder is unremarkable. Stomach/Bowel: Stomach is within normal limits. Appendix appears normal. No evidence of bowel wall thickening, distention, or inflammatory changes. Diverticulosis of the descending colon. Large amount of stool in the right colon. Vascular/Lymphatic: Aortic atherosclerosis. No enlarged abdominal or pelvic lymph nodes. Reproductive: Uterus and bilateral adnexa are unremarkable. Other: No abdominal wall hernia or abnormality. No abdominopelvic ascites. No pneumoperitoneum. Musculoskeletal: New age-indeterminate moderate L1 compression fracture with 7 mm retropulsion, not present in April. No surrounding paravertebral hematoma. IMPRESSION: 1.  No acute intra-abdominal process. 2. Bilateral nonobstructive nephrolithiasis. 3. New age-indeterminate moderate L1 compression fracture with 7 mm retropulsion, not present in April. Lack of significant surrounding paravertebral hematoma suggests the fracture is chronic. Correlate with point tenderness. 4.  Aortic atherosclerosis (ICD10-I70.0). Electronically Signed   By: Titus Dubin M.D.   On: 08/20/2018 14:12   Dg Chest 2 View  Result Date: 08/20/2018 CLINICAL DATA:  Shortness of breath. Recently discharged from the hospital for pneumonia. EXAM: CHEST - 2 VIEW COMPARISON:  Chest x-rays dated August 15, 2018, and  June 24, 2010. FINDINGS: Stable cardiomediastinal silhouette with enlargement of the main pulmonary artery. Normal pulmonary vascularity. Emphysematous changes again noted. Similar-appearing bibasilar atelectasis. No focal consolidation, pleural effusion, or pneumothorax. Age indeterminate severe L1 compression fracture, not present in April. IMPRESSION: 1. No active disease.  COPD and bibasilar atelectasis. 2. Age-indeterminate severe L1 compression fracture, not  present in April. Correlate with point tenderness. Electronically Signed   By: Titus Dubin M.D.   On: 08/20/2018 14:05   Mr Lumbar Spine Wo Contrast  Result Date: 08/21/2018 CLINICAL DATA:  Low back pain EXAM: MRI LUMBAR SPINE WITHOUT CONTRAST TECHNIQUE: Multiplanar, multisequence MR imaging of the lumbar spine was performed. No intravenous contrast was administered. COMPARISON:  CT abdomen pelvis 08/20/2018 FINDINGS: Segmentation:  Normal Alignment:  Normal Vertebrae: There is a subacute compression fracture of L1 with a osteonecrotic cleft underlying the superior endplate. There is approximately 50% height loss with retropulsion of the posterosuperior corner measuring 6 mm. There is no other marrow abnormality. Conus medullaris and cauda equina: Conus extends to the L1 level. Conus and cauda equina appear normal. Paraspinal and other soft tissues: Bilateral renal cysts. Disc levels: T11-12: Normal. T12-L1: Normal disc space. Retropulsion from the L1 compression fracture mildly narrows the spinal canal. L1-2: Normal disc space. L2-3: Normal. L3-4: Normal. L4-5: Normal disc space. Mild right facet hypertrophy. No stenosis. L5-S1: Mild disc space narrowing without spinal canal or neural foraminal stenosis. IMPRESSION: 1. Subacute compression fracture of L1 with 50% height loss and 6 mm retropulsion. Mild spinal canal stenosis. 2. Mild lumbar degenerative disc disease without spinal canal or neural foraminal stenosis otherwise. Electronically Signed    By: Ulyses Jarred M.D.   On: 08/21/2018 18:01   Dg Chest Port 1 View  Result Date: 08/15/2018 CLINICAL DATA:  Shortness of breath. EXAM: PORTABLE CHEST 1 VIEW COMPARISON:  Radiographs of August 13, 2018. FINDINGS: Stable cardiomediastinal silhouette. No pneumothorax is noted. Right lung is clear. Minimal left basilar atelectasis is noted with possible associated pleural effusion. Bony thorax is unremarkable. IMPRESSION: Minimal left basilar subsegmental atelectasis is noted with minimal left pleural effusion. Electronically Signed   By: Marijo Conception M.D.   On: 08/15/2018 09:48   Dg Chest Port 1 View  Result Date: 08/10/2018 CLINICAL DATA:  Short of breath EXAM: PORTABLE CHEST 1 VIEW COMPARISON:  06/24/2018 FINDINGS: Upper normal heart size. Low lung volumes. Subsegmental atelectasis at the right base. Hazy airspace disease at the left base. Upper lungs clear. No pneumothorax. IMPRESSION: Left basilar consolidation. Followup PA and lateral chest X-ray is recommended in 3-4 weeks following trial of antibiotic therapy to ensure resolution and exclude underlying malignancy. Electronically Signed   By: Marybelle Killings M.D.   On: 08/10/2018 13:47   Dg Abd Acute 2+v W 1v Chest  Result Date: 08/13/2018 CLINICAL DATA:  Confusion, abdominal distension EXAM: DG ABDOMEN ACUTE W/ 1V CHEST COMPARISON:  Chest radiograph 08/10/2018 FINDINGS: Enlargement of cardiac silhouette. Mediastinal contours and pulmonary vascularity normal. Lordotic positioning. Bibasilar atelectasis and questionable small bibasilar effusions. No definite infiltrate or pneumothorax. Bones demineralized. Nonobstructive bowel gas pattern. Increased stool in RIGHT colon. No bowel dilatation, bowel wall thickening or free air. No definite urinary tract calcifications. IMPRESSION: Nonobstructive bowel gas pattern with increased stool in RIGHT colon. Enlargement of cardiac silhouette. Bibasilar atelectasis and questionable small pleural effusions.  Electronically Signed   By: Lavonia Dana M.D.   On: 08/13/2018 10:10    Labs:  CBC: Recent Labs    08/21/18 0502 08/22/18 0637 08/27/18 1054 08/30/18 0926  WBC 17.0* 18.1* 15.9* 13.9*   14.3*  HGB 11.1* 12.2 11.2* 11.0*   11.0*  HCT 36.2 41.5 38.3 37.6   37.4  PLT 268 298 313 286   279    COAGS: Recent Labs    01/20/18 0609  08/20/18 1228 08/21/18 0502 08/21/18 1751  08/22/18 0637 08/22/18 1001 08/27/18 1054 08/30/18 0926  INR 1.45  --  1.0  --   --   --   --  1.0 1.0  APTT  --    < > <20* 26 38* >200* 81*  --   --    < > = values in this interval not displayed.    BMP: Recent Labs    08/20/18 1228 08/21/18 0502 08/22/18 0637 08/30/18 0926  NA 141 142 139 141  K 4.4 4.2 4.3 3.7  CL 92* 92* 88* 93*  CO2 34* 37* 35* 39*  GLUCOSE 411* 205* 132* 232*  BUN 31* 27* 25* 10  CALCIUM 9.6 9.3 9.6 9.2  CREATININE 0.73 0.67 0.67 0.73  GFRNONAA >60 >60 >60 >60  GFRAA >60 >60 >60 >60    LIVER FUNCTION TESTS: Recent Labs    08/11/18 0359 08/12/18 0436 08/13/18 0506 08/20/18 1228  BILITOT 0.6 0.5 0.5 0.8  AST _0 ALT 22 26 37 27  ALKPHOS 46 46 51 68  PROT 6.4* 6.2* 6.1* 6.3*  ALBUMIN 3.8 3.6 3.7 3.7    TUMOR MARKERS: No results for input(s): AFPTM, CEA, CA199, CHROMGRNA in the last 8760 hours.  Assessment and Plan:  Acute L1 painful fracture Scheduled for L1 Kyphoplasty Awaiting UA result-- has taken Cipro 250 mg BID x 7 days Risks and benefits of KP were discussed with the patient including, but not limited to education regarding the natural healing process of compression fractures without intervention, bleeding, infection, cement migration which may cause spinal cord damage, paralysis, pulmonary embolism or even death.  This interventional procedure involves the use of X-rays and because of the nature of the planned procedure, it is possible that we will have prolonged use of X-ray fluoroscopy.  Potential radiation risks to you include (but are  not limited to) the following: - A slightly elevated risk for cancer  several years later in life. This risk is typically less than 0.5% percent. This risk is low in comparison to the normal incidence of human cancer, which is 33% for women and 50% for men according to the [place]. - Radiation induced injury can include skin redness, resembling a rash, tissue breakdown / ulcers and hair loss (which can be temporary or permanent).   The likelihood of either of these occurring depends on the difficulty of the procedure and whether you are sensitive to radiation due to previous procedures, disease, or genetic conditions.   IF your procedure requires a prolonged use of radiation, you will be notified and given written instructions for further action.  It is your responsibility to monitor the irradiated area for the 2 weeks following the procedure and to notify your physician if you are concerned that you have suffered a radiation induced injury.    All of the patient's questions were answered, patient is agreeable to proceed.  Consent signed and in chart.  Thank you for this interesting consult.  I greatly enjoyed meeting Eileen [name] and look forward to participating in their care.  A copy of this report was sent to the requesting provider on this date.  Electronically Signed: Lavonia Drafts, PA-C 09/05/2018, 8:33 AM   I spent a total of    25 Minutes in face to face in clinical consultation, greater than 50% of which was counseling/coordinating care for KP L1

## 2018-09-06 ENCOUNTER — Encounter (HOSPITAL_COMMUNITY): Payer: Self-pay | Admitting: Interventional Radiology

## 2018-09-07 DIAGNOSIS — Z7901 Long term (current) use of anticoagulants: Secondary | ICD-10-CM | POA: Diagnosis not present

## 2018-09-07 DIAGNOSIS — Z853 Personal history of malignant neoplasm of breast: Secondary | ICD-10-CM | POA: Diagnosis not present

## 2018-09-07 DIAGNOSIS — E119 Type 2 diabetes mellitus without complications: Secondary | ICD-10-CM | POA: Diagnosis not present

## 2018-09-07 DIAGNOSIS — J9621 Acute and chronic respiratory failure with hypoxia: Secondary | ICD-10-CM | POA: Diagnosis not present

## 2018-09-07 DIAGNOSIS — I4892 Unspecified atrial flutter: Secondary | ICD-10-CM | POA: Diagnosis not present

## 2018-09-07 DIAGNOSIS — J189 Pneumonia, unspecified organism: Secondary | ICD-10-CM | POA: Diagnosis not present

## 2018-09-07 DIAGNOSIS — Z9981 Dependence on supplemental oxygen: Secondary | ICD-10-CM | POA: Diagnosis not present

## 2018-09-07 DIAGNOSIS — I10 Essential (primary) hypertension: Secondary | ICD-10-CM | POA: Diagnosis not present

## 2018-09-07 DIAGNOSIS — Z9011 Acquired absence of right breast and nipple: Secondary | ICD-10-CM | POA: Diagnosis not present

## 2018-09-07 DIAGNOSIS — Z9181 History of falling: Secondary | ICD-10-CM | POA: Diagnosis not present

## 2018-09-07 DIAGNOSIS — Z7952 Long term (current) use of systemic steroids: Secondary | ICD-10-CM | POA: Diagnosis not present

## 2018-09-07 DIAGNOSIS — Z7984 Long term (current) use of oral hypoglycemic drugs: Secondary | ICD-10-CM | POA: Diagnosis not present

## 2018-09-07 DIAGNOSIS — J440 Chronic obstructive pulmonary disease with acute lower respiratory infection: Secondary | ICD-10-CM | POA: Diagnosis not present

## 2018-09-09 DIAGNOSIS — R0902 Hypoxemia: Secondary | ICD-10-CM | POA: Diagnosis not present

## 2018-09-09 DIAGNOSIS — J449 Chronic obstructive pulmonary disease, unspecified: Secondary | ICD-10-CM | POA: Diagnosis not present

## 2018-09-10 DIAGNOSIS — Z853 Personal history of malignant neoplasm of breast: Secondary | ICD-10-CM | POA: Diagnosis not present

## 2018-09-10 DIAGNOSIS — Z7952 Long term (current) use of systemic steroids: Secondary | ICD-10-CM | POA: Diagnosis not present

## 2018-09-10 DIAGNOSIS — Z9981 Dependence on supplemental oxygen: Secondary | ICD-10-CM | POA: Diagnosis not present

## 2018-09-10 DIAGNOSIS — Z7901 Long term (current) use of anticoagulants: Secondary | ICD-10-CM | POA: Diagnosis not present

## 2018-09-10 DIAGNOSIS — Z9011 Acquired absence of right breast and nipple: Secondary | ICD-10-CM | POA: Diagnosis not present

## 2018-09-10 DIAGNOSIS — I10 Essential (primary) hypertension: Secondary | ICD-10-CM | POA: Diagnosis not present

## 2018-09-10 DIAGNOSIS — Z7984 Long term (current) use of oral hypoglycemic drugs: Secondary | ICD-10-CM | POA: Diagnosis not present

## 2018-09-10 DIAGNOSIS — J9621 Acute and chronic respiratory failure with hypoxia: Secondary | ICD-10-CM | POA: Diagnosis not present

## 2018-09-10 DIAGNOSIS — I4892 Unspecified atrial flutter: Secondary | ICD-10-CM | POA: Diagnosis not present

## 2018-09-10 DIAGNOSIS — Z9181 History of falling: Secondary | ICD-10-CM | POA: Diagnosis not present

## 2018-09-10 DIAGNOSIS — E119 Type 2 diabetes mellitus without complications: Secondary | ICD-10-CM | POA: Diagnosis not present

## 2018-09-10 DIAGNOSIS — J440 Chronic obstructive pulmonary disease with acute lower respiratory infection: Secondary | ICD-10-CM | POA: Diagnosis not present

## 2018-09-10 DIAGNOSIS — J189 Pneumonia, unspecified organism: Secondary | ICD-10-CM | POA: Diagnosis not present

## 2018-09-11 DIAGNOSIS — I4892 Unspecified atrial flutter: Secondary | ICD-10-CM | POA: Diagnosis not present

## 2018-09-11 DIAGNOSIS — J449 Chronic obstructive pulmonary disease, unspecified: Secondary | ICD-10-CM | POA: Diagnosis not present

## 2018-09-11 DIAGNOSIS — I5032 Chronic diastolic (congestive) heart failure: Secondary | ICD-10-CM | POA: Diagnosis not present

## 2018-09-11 DIAGNOSIS — J9611 Chronic respiratory failure with hypoxia: Secondary | ICD-10-CM | POA: Diagnosis not present

## 2018-09-12 ENCOUNTER — Telehealth (INDEPENDENT_AMBULATORY_CARE_PROVIDER_SITE_OTHER): Payer: Medicare Other | Admitting: Student

## 2018-09-12 ENCOUNTER — Other Ambulatory Visit: Payer: Self-pay | Admitting: *Deleted

## 2018-09-12 ENCOUNTER — Encounter: Payer: Self-pay | Admitting: *Deleted

## 2018-09-12 ENCOUNTER — Encounter: Payer: Self-pay | Admitting: Student

## 2018-09-12 VITALS — HR 75 | Ht 63.0 in | Wt 162.0 lb

## 2018-09-12 DIAGNOSIS — I4892 Unspecified atrial flutter: Secondary | ICD-10-CM

## 2018-09-12 DIAGNOSIS — R600 Localized edema: Secondary | ICD-10-CM

## 2018-09-12 DIAGNOSIS — J9611 Chronic respiratory failure with hypoxia: Secondary | ICD-10-CM | POA: Diagnosis not present

## 2018-09-12 DIAGNOSIS — J441 Chronic obstructive pulmonary disease with (acute) exacerbation: Secondary | ICD-10-CM

## 2018-09-12 DIAGNOSIS — I10 Essential (primary) hypertension: Secondary | ICD-10-CM

## 2018-09-12 NOTE — Patient Instructions (Addendum)
Medication Instructions:  Your physician has recommended you make the following change in your medication:  Increase Lasix to 60 mg in AM and 40 mg in the PM   Labwork: Will request labs from Dr. Luan Pulling  Testing/Procedures: NONE   Follow-Up: Your physician recommends that you schedule a follow-up appointment in: 2 Months    Any Other Special Instructions Will Be Listed Below (If Applicable).     If you need a refill on your cardiac medications before your next appointment, please call your pharmacy.  Thank you for choosing [facility]!       Potassium Content of Foods  Potassium is a mineral found in many foods and drinks. It affects how the heart works, and helps keep fluids and minerals balanced in the body. The amount of potassium you need each day depends on your age and any medical conditions you may have. Talk to your health care provider or dietitian about how much potassium you need. The following lists of foods provide the general serving size for foods and the approximate amount of potassium in each serving, listed in milligrams (mg). Actual values may vary depending on the product and how it is processed. High in potassium The following foods and beverages have 200 mg or more of potassium per serving:  Apricots (raw) - 2 have 200 mg of potassium.  Apricots (dry) - 5 have 200 mg of potassium.  Artichoke - 1 medium has 345 mg of potassium.  Avocado -  fruit has 245 mg of potassium.  Banana - 1 medium fruit has 425 mg of potassium.  [place] or baked beans (canned) -  cup has 280 mg of potassium.  White beans (canned) -  cup has 595 mg potassium.  Beef roast - 3 oz has 320 mg of potassium.  Ground beef - 3 oz has 270 mg of potassium.  Beets (raw or cooked) -  cup has 260 mg of potassium.  Bran muffin - 2 oz has 300 mg of potassium.  Broccoli (cooked) -  cup has 230 mg of potassium.  Brussels sprouts -  cup has 250 mg of  potassium.  Cantaloupe -  cup has 215 mg of potassium.  Cereal, 100% bran -  cup has 200-400 mg of potassium.  Cheeseburger -1 single fast food burger has 225-400 mg of potassium.  Chicken - 3 oz has 220 mg of potassium.  Clams (canned) - 3 oz has 535 mg of potassium.  Crab - 3 oz has 225 mg of potassium.  Dates - 5 have 270 mg of potassium.  Dried beans and peas -  cup has 300-475 mg of potassium.  Figs (dried) - 2 have 260 mg of potassium.  Fish (halibut, tuna, cod, snapper) - 3 oz has 480 mg of potassium.  Fish (salmon, haddock, swordfish, perch) - 3 oz has 300 mg of potassium.  Fish (tuna, canned) - 3 oz has 200 mg of potassium.  [place] fries (fast food) - 3 oz has 470 mg of potassium.  Granola with fruit and nuts -  cup has 200 mg of potassium.  Grapefruit juice -  cup has 200 mg of potassium.  Honeydew melon -  cup has 200 mg of potassium.  Kale (raw) - 1 cup has 300 mg of potassium.  Kiwi - 1 medium fruit has 240 mg of potassium.  Kohlrabi, rutabaga, parsnips -  cup has 280 mg of potassium.  Lentils -  cup has 365 mg of potassium.  Mango -  1 each has 325 mg of potassium.  Milk (nonfat, low-fat, whole, buttermilk) - 1 cup has 350-380 mg of potassium.  Milk (chocolate) - 1 cup has 420 mg of potassium  Molasses - 1 Tbsp has 295 mg of potassium.  Mushrooms -  cup has 280 mg of potassium.  Nectarine - 1 each has 275 mg of potassium.  Nuts (almonds, peanuts, hazelnuts, [place], cashew, mixed) - 1 oz has 200 mg of potassium.  Nuts (pistachios) - 1 oz has 295 mg of potassium.  Orange - 1 fruit has 240 mg of potassium.  Orange juice -  cup has 235 mg of potassium.  Papaya -  medium fruit has 390 mg of potassium.  Peanut butter (chunky) - 2 Tbsp has 240 mg of potassium.  Peanut butter (smooth) - 2 Tbsp has 210 mg of potassium.  Pear - 1 medium (200 mg) of potassium.  Pomegranate - 1 whole fruit has 400 mg of potassium.  Pomegranate juice  -  cup has 215 mg of potassium.  Pork - 3 oz has 350 mg of potassium.  Potato chips (salted) - 1 oz has 465 mg of potassium.  Potato (baked with skin) - 1 medium has 925 mg of potassium.  Potato (boiled) -  cup has 255 mg of potassium.  Potato (Mashed) -  cup has 330 mg of potassium.  Prune juice -  cup has 370 mg of potassium.  Prunes - 5 have 305 mg of potassium.  Pudding (chocolate) -  cup has 230 mg of potassium.  Pumpkin (canned) -  cup has 250 mg of potassium.  Raisins (seedless) -  cup has 270 mg of potassium.  Seeds (sunflower or pumpkin) - 1 oz has 240 mg of potassium.  Soy milk - 1 cup has 300 mg of potassium.  Spinach (cooked) - 1/2 cup has 420 mg of potassium.  Spinach (canned) -  cup has 370 mg of potassium.  Sweet potato (baked with skin) - 1 medium has 450 mg of potassium.  Swiss chard -  cup has 480 mg of potassium.  Tomato or vegetable juice -  cup has 275 mg of potassium.  Tomato (sauce or puree) -  cup has 400-550 mg of potassium.  Tomato (raw) - 1 medium has 290 mg of potassium.  Tomato (canned) -  cup has 200-300 mg of potassium.  [place] - 3 oz has 250 mg of potassium.  Wheat germ - 1 oz has 250 mg of potassium.  Winter squash -  cup has 250 mg of potassium.  Yogurt (plain or fruited) - 6 oz has 260-435 mg of potassium.  Zucchini -  cup has 220 mg of potassium. Moderate in potassium The following foods and beverages have 50-200 mg of potassium per serving:  Apple - 1 fruit has 150 mg of potassium  Apple juice -  cup has 150 mg of potassium  Applesauce -  cup has 90 mg of potassium  Apricot nectar -  cup has 140 mg of potassium  Asparagus (small spears) -  cup has 155 mg of potassium  Asparagus (large spears) - 6 have 155 mg of potassium  Bagel (cinnamon raisin) - 1 four-inch bagel has 130 mg of potassium  Bagel (egg or plain) - 1 four- inch bagel has 70 mg of potassium  Beans (green) -  cup has 90 mg of  potassium  Beans (yellow) -  cup has 190 mg of potassium  Beer, regular - 12 oz has 100  mg of potassium  Beets (canned) -  cup has 125 mg of potassium  Blackberries -  cup has 115 mg of potassium  Blueberries -  cup has 60 mg of potassium  Bread (whole wheat) - 1 slice has 70 mg of potassium  Broccoli (raw) -  cup has 145 mg of potassium  Cabbage -  cup has 150 mg of potassium  Carrots (cooked or raw) -  cup has 180 mg of potassium  Cauliflower (raw) -  cup has 150 mg of potassium  Celery (raw) -  cup has 155 mg of potassium  Cereal, bran flakes -  cup has 120-150 mg of potassium  Cheese (cottage) -  cup has 110 mg of potassium  Cherries - 10 have 150 mg of potassium  Chocolate - 1 oz bar has 165 mg of potassium  Coffee (brewed) - 6 oz has 90 mg of potassium  Corn -  cup or 1 ear has 195 mg of potassium  Cucumbers -  cup has 80 mg of potassium  Egg - 1 large egg has 60 mg of potassium  Eggplant -  cup has 60 mg of potassium  Endive (raw) -  cup has 80 mg of potassium  English muffin - 1 has 65 mg of potassium  Fish (ocean perch) - 3 oz has 192 mg of potassium  Frankfurter, beef or pork - 1 has 75 mg of potassium  Fruit cocktail -  cup has 115 mg of potassium  Grape juice -  cup has 170 mg of potassium  Grapefruit -  fruit has 175 mg of potassium  Grapes -  cup has 155 mg of potassium  Greens: kale, turnip, collard -  cup has 110-150 mg of potassium  Ice cream or frozen yogurt (chocolate) -  cup has 175 mg of potassium  Ice cream or frozen yogurt (vanilla) -  cup has 120-150 mg of potassium  Lemons, limes - 1 each has 80 mg of potassium  Lettuce - 1 cup has 100 mg of potassium  Mixed vegetables -  cup has 150 mg of potassium  Mushrooms, raw -  cup has 110 mg of potassium  Nuts (walnuts, pecans, or macadamia) - 1 oz has 125 mg of potassium  Oatmeal -  cup has 80 mg of potassium  Okra -  cup has 110 mg of  potassium  Onions -  cup has 120 mg of potassium  Peach - 1 has 185 mg of potassium  Peaches (canned) -  cup has 120 mg of potassium  Pears (canned) -  cup has 120 mg of potassium  Peas, green (frozen) -  cup has 90 mg of potassium  Peppers (Green) -  cup has 130 mg of potassium  Peppers (Red) -  cup has 160 mg of potassium  Pineapple juice -  cup has 165 mg of potassium  Pineapple (fresh or canned) -  cup has 100 mg of potassium  Plums - 1 has 105 mg of potassium  Pudding, vanilla -  cup has 150 mg of potassium  Raspberries -  cup has 90 mg of potassium  Rhubarb -  cup has 115 mg of potassium  Rice, wild -  cup has 80 mg of potassium  Shrimp - 3 oz has 155 mg of potassium  Spinach (raw) - 1 cup has 170 mg of potassium  Strawberries -  cup has 125 mg of potassium  Summer squash -  cup has 175-200 mg of potassium  Swiss chard (raw) - 1 cup has 135 mg of potassium  Tangerines - 1 fruit has 140 mg of potassium  Tea, brewed - 6 oz has 65 mg of potassium  Turnips -  cup has 140 mg of potassium  Watermelon -  cup has 85 mg of potassium  Wine (Red, table) - 5 oz has 180 mg of potassium  Wine (White, table) - 5 oz 100 mg of potassium Low in potassium The following foods and beverages have less than 50 mg of potassium per serving.  Bread (white) - 1 slice has 30 mg of potassium  Carbonated beverages - 12 oz has less than 5 mg of potassium  Cheese - 1 oz has 20-30 mg of potassium  Cranberries -  cup has 45 mg of potassium  Cranberry juice cocktail -  cup has 20 mg of potassium  Fats and oils - 1 Tbsp has less than 5 mg of potassium  Hummus - 1 Tbsp has 32 mg of potassium  Nectar (papaya, mango, or pear) -  cup has 35 mg of potassium  Rice (white or brown) -  cup has 50 mg of potassium  Spaghetti or macaroni (cooked) -  cup has 30 mg of potassium  Tortilla, flour or corn - 1 has 50 mg of potassium  Waffle - 1 four-inch waffle has  50 mg of potassium  Water chestnuts -  cup has 40 mg of potassium Summary  Potassium is a mineral found in many foods and drinks. It affects how the heart works, and helps keep fluids and minerals balanced in the body.  The amount of potassium you need each day depends on your age and any existing medical conditions you may have. Your health care provider or dietitian may recommend an amount of potassium that you should have each day. This information is not intended to replace advice given to you by your health care provider. Make sure you discuss any questions you have with your health care provider. Document Released: 10/11/2004 Document Revised: 02/09/2017 Document Reviewed: 05/24/2016 Elsevier Patient Education  [place].

## 2018-09-12 NOTE — Patient Outreach (Signed)
Outreach call to pt for telephone assessment, spoke with pt, HIPAA verified, pt reports she had outpatient back surgery last Thursday and is doing well, taking tylenol q 4 hours " and this has helped"  Pt states CBG 193 today and 221 yesterday and still on prednisone, states " it's always higher when I'm on that and I got prednisone injection yesterday which made it worse"  Pt states she saw Dr. Luan Pulling (pulmonary ) yesterday and is to see cardiologist today,  Reports has all medications and taking as prescribed, has been using inhalers as prescribed, states " breathing a little better"  Oxygen saturations range upper 80's to low 90's.  Home health RN continues seeing pt.  Pt states she is having difficulty afford xarelto and states " the price keeps going up every month, now around 80$ month"  Pt requests [facility] pharmacist call her. RN CM placed order for [facility] pharmacist.  [facility] CM Care Plan Problem One     Most Recent Value  Care Plan Problem One  Knowledge deficit related to COPD  Role Documenting the Problem One  Care Management [place] for Problem One  Active  [facility] Long Term Goal   Pt will verbalize improved self care for COPD within 60 days.  [facility] Long Term Goal Start Date  08/23/18 [goal re-established]  [facility] Long Term Goal Met Date  08/30/18  Interventions for Problem One Long Term Goal  RN CM reviewed plan of care with pt, placed order for [facility] pharmacist assistance, reviewed recent MD appointments and upcoming appointments  [facility] CM Short Term Goal #1   Pt will verbalize COPD action plan/ zones within 30 days  [facility] CM Short Term Goal #1 Start Date  09/12/18 [goal re-established]  Interventions for Short Term Goal #1  RN CM reviewed COPD action plan, importance of calling MD early on for change in health status, pt states she is in green zone today  [facility] CM Short Term Goal #2   Pt will verbalize signs/ symptoms exacerbation of A-Fib/ A-flutter within 30 days.  [facility] CM Short Term Goal #2 Start  Date  07/22/18 [goal re-established]  [facility] CM Short Term Goal #2 Met Date  08/23/18      PLAN Outreach pt for telephone assessment next month  Jacqlyn Larsen [facility], [place] Coordinator [phone]

## 2018-09-12 NOTE — Progress Notes (Signed)
Virtual Visit via Telephone Note   This visit type was conducted due to national recommendations for restrictions regarding the COVID-19 Pandemic (e.g. social distancing) in an effort to limit this patient's exposure and mitigate transmission in our community.  Due to her co-morbid illnesses, this patient is at least at moderate risk for complications without adequate follow up.  This format is felt to be most appropriate for this patient at this time.  The patient did not have access to video technology/had technical difficulties with video requiring transitioning to audio format only (telephone).  All issues noted in this document were discussed and addressed.  No physical exam could be performed with this format.  Please refer to the patient's chart for her  consent to telehealth for [facility].   Date:  09/12/2018   ID:  Eileen [name], DOB [birth year], MRN [id number]  Patient Location: Home Provider Location: Office  PCP:  Lavella Lemons, PA  Cardiologist:  Kate Sable, MD  Electrophysiologist:  None   Evaluation Performed:  Follow-Up Visit  Chief Complaint:  Edema  History of Present Illness:    Eileen [name] is a 73 y.o. female with past medical history of PAF (on Xarelto), HTN, Type 2 DM and COPD who presents for a telehealth visit for evaluation of worsening lower extremity edema.   She most recently had a telehealth visit with myself in 06/2018 following a recent admission for atrial flutter with RVR and requiring TEE guided DCCV on 06/27/2018.  She had been discharged on Amiodarone 200 mg twice daily for 3 weeks then reduce to 200 mg daily along with Lopressor and Xarelto. She was overall doing well at the time of her follow-up visit and denied any recent palpitations. Had baseline dyspnea on exertion due to COPD and was using 3 L nasal cannula. Was instructed to reduce Amiodarone to 200 mg daily the following week.   In the interim, she was admitted to [facility]  from 08/20/2018 - 08/22/2018 for evaluation of worsening dyspnea and acute hypoxia. Was admitted with acute on chronic hypoxic respiratory failure secondary to COPD. Was found to have an L1 acute/subacute compression fracture and outpatient follow-up with IR was recommended for kyphoplasty intervention. Maintained NSR during admission by review of her EKG's.   In talking with the patient today, she reports having baseline dyspnea on exertion and denies any recent change in her symptoms. On 3.5L [place] at baseline. Denies any specific orthopnea or PND. No recent chest pain or palpitations.    She has been experiencing worsening lower extremity edema despite compliance with Lasix 67m BID. Had repeat labs for Dr. HLuan Pullingearlier this week which were obtained by Home Health. Follows a low-sodium diet and consumes ~ 1.5L of fluid each day. Weight has actually declined on her home scales as this was previously 174 lbs, now at 162 lbs.   The patient does not have symptoms concerning for COVID-19 infection (fever, chills, cough, or new shortness of breath).    Past Medical History:  Diagnosis Date  . Asthma   . Atrial flutter ([facility]   . Cancer ([facility]    Right breast  . [place] obstructive [place] disease[place] [place]   . History of breast cancer    right breast  . Hypertension   . On home O2   . Type 2 diabetes mellitus [place]    Past Surgical History:  Procedure Laterality Date  . BREAST SURGERY  2011   Right breast mastectomy  .  CARDIOVERSION N/A 06/27/2018   Procedure: CARDIOVERSION;  Surgeon: Arnoldo Lenis, MD;  [facility];  Service: Endoscopy;  Laterality: N/A;  . CESAREAN SECTION    . COLONOSCOPY N/A 01/22/2018   Procedure: COLONOSCOPY;  Surgeon: Rogene Houston, MD;  Location: [facility] SU[facility];  Service: Endoscopy;  Laterality: N/A;  . HERNIA REPAIR     RIH  . IR VERTEBROPLASTY LUMBAR BX INC UNI/BIL INC/INJECT/IMAGING  09/05/2018  . MASTECTOMY  2011   right breast  . TEE  WITHOUT CARDIOVERSION N/A 06/27/2018   Procedure: TRANSESOPHAGEAL ECHOCARDIOGRAM (TEE) WITH PROPOFOL;  Surgeon: Arnoldo Lenis, MD;  Location: [facility];  Service: Endoscopy;  Laterality: N/A;     Current Meds  Medication Sig  . acetaminophen (TYLENOL) 500 MG tablet Take 500 mg by mouth every 8 (eight) hours as needed for mild pain or headache.   . albuterol (PROVENTIL) (2.5 MG/3ML) 0.083% nebulizer solution Take 2.5 mg by nebulization every 6 (six) hours as needed for wheezing or shortness of breath.  Marland Kitchen amiodarone (PACERONE) 200 MG tablet Take 1 tablet (200 mg total) by mouth daily.  . blood glucose meter kit and supplies KIT Dispense based on patient and insurance preference. Use up to four times daily as directed. (FOR ICD-9 250.00, 250.01).  Check sugar before meals and at night, keep a log.  . docusate sodium (COLACE) 100 MG capsule Take 1 capsule (100 mg total) by mouth 2 (two) times daily. (Patient taking differently: Take 100 mg by mouth daily. )  . furosemide (LASIX) 40 MG tablet Take 80 mg by mouth daily.   Marland Kitchen guaiFENesin (MUCINEX) 600 MG 12 hr tablet Take 1 tablet (600 mg total) by mouth 2 (two) times daily.  . metFORMIN (GLUCOPHAGE-XR) 500 MG 24 hr tablet Take 500 mg by mouth daily with breakfast.   . metoprolol tartrate (LOPRESSOR) 50 MG tablet Take 50 mg by mouth 2 (two) times daily.  . OXYGEN Inhale 2.5-3 L into the lungs continuous.   . polyethylene glycol (MIRALAX / GLYCOLAX) 17 g packet Take 17 g by mouth daily as needed for mild constipation.  . potassium chloride SA (K-DUR) 20 MEQ tablet Take 20 mEq by mouth 2 (two) times daily.  . predniSONE (DELTASONE) 10 MG tablet Take 10 mg by mouth daily with breakfast.  . PROAIR HFA 108 (90 BASE) MCG/ACT inhaler Inhale 2 puffs into the lungs every 6 (six) hours as needed for wheezing or shortness of breath.   . rivaroxaban (XARELTO) 20 MG TABS tablet Take 1 tablet (20 mg total) by mouth daily with supper. Stop Xarelto until  Monday (08/26/18) in anticipation for kyphoplasty  . TRELEGY ELLIPTA 100-62.5-25 MCG/INH AEPB Take 1 puff by mouth daily.     Allergies:   Codeine   Social History   Tobacco Use  . Smoking status: Former Smoker    Packs/day: 0.50    Years: 32.00    Pack years: 16.00    Types: Cigarettes    Start date: 12/12/1962    Quit date: 03/13/1994    Years since quitting: 24.5  . Smokeless tobacco: Never Used  Substance Use Topics  . Alcohol use: No    Alcohol/week: 0.0 standard drinks  . Drug use: No     Family Hx: The patient's family history includes Cancer in her mother; Diabetes in her brother.  ROS:   Please see the history of present illness.     All other systems reviewed and are negative.   Prior  CV studies:   The following studies were reviewed today:  TEE: 06/2018 IMPRESSIONS   1. Left atrial size was moderately dilated.  2. No evidence of a thrombus present in the left atrial appendage.  3. Right atrial size was moderately dilated.  4. Mitral valve regurgitation is moderate by color flow Doppler. No evidence of mitral valve stenosis.  5. The tricuspid valve was not well visualized. Tricuspid valve regurgitation is moderate.  6. The aortic valve is tricuspid Aortic valve regurgitation is mild by color flow Doppler. No stenosis of the aortic valve.  7. Limited focused study due to hypoxia during study.  8. The left ventricle has low normal systolic function, with an ejection fraction of 50-55%.  9. Would recommend TTE to better evaluate MR and TR after COVID-19 limitations have passed, would also be helpful to evaluate LVEF with aflutter resolved. Limited images taken during this study due to hypoxia during study, MR and TR appear at least  moderate.  Labs/Other Tests and Data Reviewed:    EKG:  An ECG dated 08/20/2018 was personally reviewed today and demonstrated:  NSR, HR 81, with no acute ST or T-wave changes.   Recent Labs: 06/24/2018: TSH 2.605 08/20/2018: ALT 27;  B Natriuretic Peptide 889.0 08/21/2018: Magnesium 2.3 08/30/2018: BUN 10; Creatinine, Ser 0.73; Potassium 3.7; Sodium 141 09/05/2018: Hemoglobin 12.0; Platelets 331   Recent Lipid Panel Lab Results  Component Value Date/Time   CHOL 204 (H) 03/23/2015 04:20 AM   TRIG 86 03/23/2015 04:20 AM   HDL 85 03/23/2015 04:20 AM   CHOLHDL 2.4 03/23/2015 04:20 AM   LDLCALC 102 (H) 03/23/2015 04:20 AM    Wt Readings from Last 3 Encounters:  09/12/18 162 lb (73.5 kg)  09/05/18 174 lb (78.9 kg)  08/30/18 174 lb (78.9 kg)     Objective:    Vital Signs:  Pulse 75   Ht _0  (1.6 m)   Wt 162 lb (73.5 kg)   SpO2 93%   BMI 28.70 kg/m    General: Pleasant female sounding in NAD Psych: Normal affect. Neuro: Alert and oriented X 3. Lungs:  Resp regular and unlabored while talking on the phone.   ASSESSMENT & PLAN:    1. Paroxysmal Atrial Flutter - diagnosed in 06/2018 and underwent successful TEE-guided DCCV on 06/27/2018. Was maintaining NSR during her most recent admission by review of EKG tracings and she denies any recurrent palpitations.  - continue  Amiodarone 200 mg daily along with Lopressor 59m BID.  - she denies any evidence of active bleeding. Remains on Xarelto for anticoagulation.  2. HTN - she does not have a BP cuff at home but reports this was well-controlled when checked by her PCP yesterday. Continue current regimen with Lopressor 522mBID.    3. Lower Extremity Edema - reports worsening bilateral lower extremity edema despite compliance with Lasix 4079mID. Per her report, she has been monitoring sodium and fluid intake. No specific orthopnea or PND with weight having actually declined as this was previously 174 lbs, now at 162 lbs.  - recommended to elevated her extremities as much as possible and to use compression stockings. Will titrate Lasix from 64m49mD to 60mg78mAM/64mg 61mM. She just had labs with Dr. HawkinLuan Pullingas repeat labs scheduled in 2-3 weeks. Will request  these records.   4. COPD - she is on 3.5L [place] at baseline. Followed by Dr. HawkinLuan PullingVID-19 Education: The signs and symptoms of COVID-19 were discussed  with the patient and how to seek care for testing (follow up with PCP or arrange E-visit).  The importance of social distancing was discussed today.  Time:   Today, I have spent 19 minutes with the patient with telehealth technology discussing the above problems.     Medication Adjustments/Labs and Tests Ordered: Current medicines are reviewed at length with the patient today.  Concerns regarding medicines are outlined above.   Tests Ordered: No orders of the defined types were placed in this encounter.   Medication Changes: No orders of the defined types were placed in this encounter.   Follow Up:  In Person in 2 month(s)  Signed, Erma Heritage, PA-C  09/12/2018 4:23 PM    [place]

## 2018-09-17 ENCOUNTER — Other Ambulatory Visit: Payer: Self-pay | Admitting: Pharmacist

## 2018-09-17 DIAGNOSIS — Z7952 Long term (current) use of systemic steroids: Secondary | ICD-10-CM | POA: Diagnosis not present

## 2018-09-17 DIAGNOSIS — Z9011 Acquired absence of right breast and nipple: Secondary | ICD-10-CM | POA: Diagnosis not present

## 2018-09-17 DIAGNOSIS — Z853 Personal history of malignant neoplasm of breast: Secondary | ICD-10-CM | POA: Diagnosis not present

## 2018-09-17 DIAGNOSIS — I10 Essential (primary) hypertension: Secondary | ICD-10-CM | POA: Diagnosis not present

## 2018-09-17 DIAGNOSIS — J440 Chronic obstructive pulmonary disease with acute lower respiratory infection: Secondary | ICD-10-CM | POA: Diagnosis not present

## 2018-09-17 DIAGNOSIS — I4892 Unspecified atrial flutter: Secondary | ICD-10-CM | POA: Diagnosis not present

## 2018-09-17 DIAGNOSIS — Z7901 Long term (current) use of anticoagulants: Secondary | ICD-10-CM | POA: Diagnosis not present

## 2018-09-17 DIAGNOSIS — Z7984 Long term (current) use of oral hypoglycemic drugs: Secondary | ICD-10-CM | POA: Diagnosis not present

## 2018-09-17 DIAGNOSIS — E119 Type 2 diabetes mellitus without complications: Secondary | ICD-10-CM | POA: Diagnosis not present

## 2018-09-17 DIAGNOSIS — J189 Pneumonia, unspecified organism: Secondary | ICD-10-CM | POA: Diagnosis not present

## 2018-09-17 DIAGNOSIS — Z9181 History of falling: Secondary | ICD-10-CM | POA: Diagnosis not present

## 2018-09-17 DIAGNOSIS — Z9981 Dependence on supplemental oxygen: Secondary | ICD-10-CM | POA: Diagnosis not present

## 2018-09-17 DIAGNOSIS — J9621 Acute and chronic respiratory failure with hypoxia: Secondary | ICD-10-CM | POA: Diagnosis not present

## 2018-09-17 NOTE — Patient Outreach (Addendum)
[facility]) Care Management [place]  09/17/2018  Eileen [name] Oct 25, 1945 [id number]  Reason for referral: medication assistance  [facility] pharmacy case is being closed due to the following reasons:  The patient is participating in another care management program.  Handoff given to intake coordinator who will relay message to PharmD at [facility] in [place], [place] who will follow case.  Thank you for allowing [facility] pharmacy to be involved in this patient's care.     Regina Eck, PharmD, [place]  [phone]

## 2018-09-21 DIAGNOSIS — J449 Chronic obstructive pulmonary disease, unspecified: Secondary | ICD-10-CM | POA: Diagnosis not present

## 2018-09-21 DIAGNOSIS — I10 Essential (primary) hypertension: Secondary | ICD-10-CM | POA: Diagnosis not present

## 2018-09-21 DIAGNOSIS — I4892 Unspecified atrial flutter: Secondary | ICD-10-CM | POA: Diagnosis not present

## 2018-09-21 DIAGNOSIS — I480 Paroxysmal atrial fibrillation: Secondary | ICD-10-CM | POA: Diagnosis not present

## 2018-09-21 DIAGNOSIS — S32010A Wedge compression fracture of first lumbar vertebra, initial encounter for closed fracture: Secondary | ICD-10-CM | POA: Diagnosis not present

## 2018-09-22 DIAGNOSIS — J9621 Acute and chronic respiratory failure with hypoxia: Secondary | ICD-10-CM | POA: Diagnosis not present

## 2018-09-22 DIAGNOSIS — Z7984 Long term (current) use of oral hypoglycemic drugs: Secondary | ICD-10-CM | POA: Diagnosis not present

## 2018-09-22 DIAGNOSIS — I10 Essential (primary) hypertension: Secondary | ICD-10-CM | POA: Diagnosis not present

## 2018-09-22 DIAGNOSIS — Z7901 Long term (current) use of anticoagulants: Secondary | ICD-10-CM | POA: Diagnosis not present

## 2018-09-22 DIAGNOSIS — Z853 Personal history of malignant neoplasm of breast: Secondary | ICD-10-CM | POA: Diagnosis not present

## 2018-09-22 DIAGNOSIS — Z7952 Long term (current) use of systemic steroids: Secondary | ICD-10-CM | POA: Diagnosis not present

## 2018-09-22 DIAGNOSIS — Z9011 Acquired absence of right breast and nipple: Secondary | ICD-10-CM | POA: Diagnosis not present

## 2018-09-22 DIAGNOSIS — J440 Chronic obstructive pulmonary disease with acute lower respiratory infection: Secondary | ICD-10-CM | POA: Diagnosis not present

## 2018-09-22 DIAGNOSIS — Z9981 Dependence on supplemental oxygen: Secondary | ICD-10-CM | POA: Diagnosis not present

## 2018-09-22 DIAGNOSIS — J189 Pneumonia, unspecified organism: Secondary | ICD-10-CM | POA: Diagnosis not present

## 2018-09-22 DIAGNOSIS — E119 Type 2 diabetes mellitus without complications: Secondary | ICD-10-CM | POA: Diagnosis not present

## 2018-09-22 DIAGNOSIS — Z9181 History of falling: Secondary | ICD-10-CM | POA: Diagnosis not present

## 2018-09-22 DIAGNOSIS — I4892 Unspecified atrial flutter: Secondary | ICD-10-CM | POA: Diagnosis not present

## 2018-09-26 ENCOUNTER — Emergency Department (HOSPITAL_COMMUNITY): Payer: Medicare Other

## 2018-09-26 ENCOUNTER — Encounter (HOSPITAL_COMMUNITY): Payer: Self-pay | Admitting: Emergency Medicine

## 2018-09-26 ENCOUNTER — Inpatient Hospital Stay (HOSPITAL_COMMUNITY)
Admission: EM | Admit: 2018-09-26 | Discharge: 2018-09-30 | DRG: 190 | Disposition: A | Discharge status: Home / Self Care | Payer: Medicare Other | Attending: Internal Medicine | Admitting: Internal Medicine

## 2018-09-26 ENCOUNTER — Other Ambulatory Visit: Payer: Self-pay

## 2018-09-26 DIAGNOSIS — J441 Chronic obstructive pulmonary disease with (acute) exacerbation: Secondary | ICD-10-CM | POA: Diagnosis not present

## 2018-09-26 DIAGNOSIS — Z853 Personal history of malignant neoplasm of breast: Secondary | ICD-10-CM

## 2018-09-26 DIAGNOSIS — X58XXXA Exposure to other specified factors, initial encounter: Secondary | ICD-10-CM | POA: Diagnosis present

## 2018-09-26 DIAGNOSIS — M810 Age-related osteoporosis without current pathological fracture: Secondary | ICD-10-CM | POA: Diagnosis not present

## 2018-09-26 DIAGNOSIS — I4892 Unspecified atrial flutter: Secondary | ICD-10-CM | POA: Diagnosis not present

## 2018-09-26 DIAGNOSIS — Z87891 Personal history of nicotine dependence: Secondary | ICD-10-CM | POA: Diagnosis not present

## 2018-09-26 DIAGNOSIS — J9621 Acute and chronic respiratory failure with hypoxia: Secondary | ICD-10-CM | POA: Diagnosis present

## 2018-09-26 DIAGNOSIS — K5900 Constipation, unspecified: Secondary | ICD-10-CM | POA: Diagnosis present

## 2018-09-26 DIAGNOSIS — Z7984 Long term (current) use of oral hypoglycemic drugs: Secondary | ICD-10-CM | POA: Diagnosis not present

## 2018-09-26 DIAGNOSIS — Z833 Family history of diabetes mellitus: Secondary | ICD-10-CM | POA: Diagnosis not present

## 2018-09-26 DIAGNOSIS — Z7901 Long term (current) use of anticoagulants: Secondary | ICD-10-CM

## 2018-09-26 DIAGNOSIS — Z9981 Dependence on supplemental oxygen: Secondary | ICD-10-CM | POA: Diagnosis not present

## 2018-09-26 DIAGNOSIS — Z9011 Acquired absence of right breast and nipple: Secondary | ICD-10-CM

## 2018-09-26 DIAGNOSIS — Z7952 Long term (current) use of systemic steroids: Secondary | ICD-10-CM

## 2018-09-26 DIAGNOSIS — M4856XA Collapsed vertebra, not elsewhere classified, lumbar region, initial encounter for fracture: Secondary | ICD-10-CM | POA: Diagnosis present

## 2018-09-26 DIAGNOSIS — S32010A Wedge compression fracture of first lumbar vertebra, initial encounter for closed fracture: Secondary | ICD-10-CM | POA: Diagnosis not present

## 2018-09-26 DIAGNOSIS — I10 Essential (primary) hypertension: Secondary | ICD-10-CM | POA: Diagnosis not present

## 2018-09-26 DIAGNOSIS — R0602 Shortness of breath: Secondary | ICD-10-CM | POA: Diagnosis not present

## 2018-09-26 DIAGNOSIS — Z801 Family history of malignant neoplasm of trachea, bronchus and lung: Secondary | ICD-10-CM | POA: Diagnosis not present

## 2018-09-26 DIAGNOSIS — J9811 Atelectasis: Secondary | ICD-10-CM | POA: Diagnosis present

## 2018-09-26 DIAGNOSIS — J9620 Acute and chronic respiratory failure, unspecified whether with hypoxia or hypercapnia: Secondary | ICD-10-CM | POA: Diagnosis present

## 2018-09-26 DIAGNOSIS — Z885 Allergy status to narcotic agent status: Secondary | ICD-10-CM | POA: Diagnosis not present

## 2018-09-26 DIAGNOSIS — Z79899 Other long term (current) drug therapy: Secondary | ICD-10-CM

## 2018-09-26 DIAGNOSIS — T380X5A Adverse effect of glucocorticoids and synthetic analogues, initial encounter: Secondary | ICD-10-CM | POA: Diagnosis present

## 2018-09-26 DIAGNOSIS — Z7951 Long term (current) use of inhaled steroids: Secondary | ICD-10-CM

## 2018-09-26 DIAGNOSIS — E1165 Type 2 diabetes mellitus with hyperglycemia: Secondary | ICD-10-CM | POA: Diagnosis present

## 2018-09-26 DIAGNOSIS — Z20828 Contact with and (suspected) exposure to other viral communicable diseases: Secondary | ICD-10-CM | POA: Diagnosis present

## 2018-09-26 DIAGNOSIS — M7989 Other specified soft tissue disorders: Secondary | ICD-10-CM | POA: Diagnosis not present

## 2018-09-26 LAB — CBC WITH DIFFERENTIAL/PLATELET
Abs Immature Granulocytes: 0.05 10*3/uL (ref 0.00–0.07)
Basophils Absolute: 0 10*3/uL (ref 0.0–0.1)
Basophils Relative: 0 %
Eosinophils Absolute: 0 10*3/uL (ref 0.0–0.5)
Eosinophils Relative: 0 %
HCT: 39.7 % (ref 36.0–46.0)
Hemoglobin: 11.4 g/dL — ABNORMAL LOW (ref 12.0–15.0)
Immature Granulocytes: 1 %
Lymphocytes Relative: 13 %
Lymphs Abs: 1.3 10*3/uL (ref 0.7–4.0)
MCH: 28.6 pg (ref 26.0–34.0)
MCHC: 28.7 g/dL — ABNORMAL LOW (ref 30.0–36.0)
MCV: 99.5 fL (ref 80.0–100.0)
Monocytes Absolute: 0.3 10*3/uL (ref 0.1–1.0)
Monocytes Relative: 3 %
Neutro Abs: 8.2 10*3/uL — ABNORMAL HIGH (ref 1.7–7.7)
Neutrophils Relative %: 83 %
Platelets: 312 10*3/uL (ref 150–400)
RBC: 3.99 MIL/uL (ref 3.87–5.11)
RDW: 14 % (ref 11.5–15.5)
WBC: 9.9 10*3/uL (ref 4.0–10.5)
nRBC: 0 % (ref 0.0–0.2)

## 2018-09-26 LAB — FERRITIN: Ferritin: 33 ng/mL (ref 11–307)

## 2018-09-26 LAB — BRAIN NATRIURETIC PEPTIDE: B Natriuretic Peptide: 302 pg/mL — ABNORMAL HIGH (ref 0.0–100.0)

## 2018-09-26 LAB — COMPREHENSIVE METABOLIC PANEL
ALT: 20 U/L (ref 0–44)
AST: 24 U/L (ref 15–41)
Albumin: 3.8 g/dL (ref 3.5–5.0)
Alkaline Phosphatase: 111 U/L (ref 38–126)
Anion gap: 9 (ref 5–15)
BUN: 13 mg/dL (ref 8–23)
CO2: 37 mmol/L — ABNORMAL HIGH (ref 22–32)
Calcium: 9 mg/dL (ref 8.9–10.3)
Chloride: 98 mmol/L (ref 98–111)
Creatinine, Ser: 0.77 mg/dL (ref 0.44–1.00)
GFR calc Af Amer: >60 mL/min (ref >60)
GFR calc non Af Amer: >60 mL/min (ref >60)
Glucose, Bld: 238 mg/dL — ABNORMAL HIGH (ref 70–99)
Potassium: 4 mmol/L (ref 3.5–5.1)
Sodium: 144 mmol/L (ref 135–145)
Total Bilirubin: 0.4 mg/dL (ref 0.3–1.2)
Total Protein: 6.7 g/dL (ref 6.5–8.1)

## 2018-09-26 LAB — D-DIMER, QUANTITATIVE: D-Dimer, Quant: 0.84 ug/mL-FEU — ABNORMAL HIGH (ref 0.00–0.50)

## 2018-09-26 LAB — GLUCOSE, CAPILLARY
Glucose-Capillary: 252 mg/dL — ABNORMAL HIGH (ref 70–99)
Glucose-Capillary: 322 mg/dL — ABNORMAL HIGH (ref 70–99)

## 2018-09-26 LAB — PROCALCITONIN: Procalcitonin: <0.1 ng/mL

## 2018-09-26 LAB — LACTATE DEHYDROGENASE: LDH: 221 U/L — ABNORMAL HIGH (ref 98–192)

## 2018-09-26 LAB — SARS CORONAVIRUS 2 BY RT PCR (HOSPITAL ORDER, PERFORMED IN ~~LOC~~ HOSPITAL LAB): SARS Coronavirus 2: NEGATIVE

## 2018-09-26 MED ORDER — DOCUSATE SODIUM 100 MG PO CAPS
100.0000 mg | ORAL_CAPSULE | Freq: Two times a day (BID) | ORAL | Status: DC
  Administered 2018-09-27 – 2018-09-30 (×7): 100 mg via ORAL
  Filled 2018-09-26 (×7): qty 1

## 2018-09-26 MED ORDER — PHENOL 1.4 % MT LIQD
1.0000 | OROMUCOSAL | Status: DC | PRN
  Filled 2018-09-26: qty 177

## 2018-09-26 MED ORDER — IPRATROPIUM-ALBUTEROL 0.5-2.5 (3) MG/3ML IN SOLN: 3.0000 mL | RESPIRATORY_TRACT | Status: DC | PRN

## 2018-09-26 MED ORDER — ALUM & MAG HYDROXIDE-SIMETH 200-200-20 MG/5ML PO SUSP: 30.0000 mL | ORAL | Status: DC | PRN

## 2018-09-26 MED ORDER — MUSCLE RUB 10-15 % EX CREA
1.0000 "application " | TOPICAL_CREAM | CUTANEOUS | Status: DC | PRN
  Filled 2018-09-26: qty 85

## 2018-09-26 MED ORDER — FUROSEMIDE 80 MG PO TABS
80.0000 mg | ORAL_TABLET | Freq: Every day | ORAL | Status: DC
  Administered 2018-09-26 – 2018-09-30 (×5): 80 mg via ORAL
  Filled 2018-09-26 (×5): qty 1

## 2018-09-26 MED ORDER — LEVALBUTEROL HCL 1.25 MG/0.5ML IN NEBU
1.2500 mg | INHALATION_SOLUTION | Freq: Three times a day (TID) | RESPIRATORY_TRACT | Status: DC
  Administered 2018-09-27 – 2018-09-30 (×10): 1.25 mg via RESPIRATORY_TRACT
  Filled 2018-09-26 (×11): qty 0.5

## 2018-09-26 MED ORDER — INSULIN ASPART 100 UNIT/ML ~~LOC~~ SOLN
6.0000 [IU] | Freq: Three times a day (TID) | SUBCUTANEOUS | Status: DC
  Administered 2018-09-26 – 2018-09-28 (×6): 6 [IU] via SUBCUTANEOUS

## 2018-09-26 MED ORDER — HYDROCODONE-ACETAMINOPHEN 5-325 MG PO TABS: 1.0000 | ORAL_TABLET | ORAL | Status: DC | PRN

## 2018-09-26 MED ORDER — CHLORHEXIDINE GLUCONATE CLOTH 2 % EX PADS
6.0000 | MEDICATED_PAD | Freq: Every day | CUTANEOUS | Status: DC
  Administered 2018-09-27 – 2018-09-28 (×2): 6 via TOPICAL

## 2018-09-26 MED ORDER — IPRATROPIUM BROMIDE 0.02 % IN SOLN
RESPIRATORY_TRACT | Status: AC
  Administered 2018-09-26: 17:00:00 0.5 mg via RESPIRATORY_TRACT
  Filled 2018-09-26: qty 2.5

## 2018-09-26 MED ORDER — LIP MEDEX EX OINT
1.0000 "application " | TOPICAL_OINTMENT | CUTANEOUS | Status: DC | PRN
  Filled 2018-09-26: qty 7

## 2018-09-26 MED ORDER — IPRATROPIUM BROMIDE HFA 17 MCG/ACT IN AERS
2.0000 | INHALATION_SPRAY | Freq: Once | RESPIRATORY_TRACT | Status: AC
  Administered 2018-09-26: 14:00:00 2 via RESPIRATORY_TRACT
  Filled 2018-09-26: qty 12.9

## 2018-09-26 MED ORDER — BUDESONIDE 0.5 MG/2ML IN SUSP
0.5000 mg | Freq: Two times a day (BID) | RESPIRATORY_TRACT | Status: DC
  Administered 2018-09-26 – 2018-09-30 (×8): 0.5 mg via RESPIRATORY_TRACT
  Filled 2018-09-26 (×7): qty 2

## 2018-09-26 MED ORDER — METOPROLOL TARTRATE 50 MG PO TABS
50.0000 mg | ORAL_TABLET | Freq: Two times a day (BID) | ORAL | Status: DC
  Administered 2018-09-26 – 2018-09-30 (×8): 50 mg via ORAL
  Filled 2018-09-26 (×8): qty 1

## 2018-09-26 MED ORDER — IPRATROPIUM BROMIDE 0.02 % IN SOLN
0.5000 mg | Freq: Three times a day (TID) | RESPIRATORY_TRACT | Status: DC
  Administered 2018-09-26 (×2): 0.5 mg via RESPIRATORY_TRACT
  Filled 2018-09-26: qty 2.5

## 2018-09-26 MED ORDER — ONDANSETRON HCL 4 MG/2ML IJ SOLN: 4.0000 mg | Freq: Four times a day (QID) | INTRAMUSCULAR | Status: DC | PRN

## 2018-09-26 MED ORDER — ONDANSETRON HCL 4 MG PO TABS: 4.0000 mg | ORAL_TABLET | Freq: Four times a day (QID) | ORAL | Status: DC | PRN

## 2018-09-26 MED ORDER — POLYVINYL ALCOHOL 1.4 % OP SOLN
1.0000 [drp] | OPHTHALMIC | Status: DC | PRN
  Filled 2018-09-26: qty 15

## 2018-09-26 MED ORDER — INSULIN ASPART 100 UNIT/ML ~~LOC~~ SOLN
0.0000 [IU] | Freq: Three times a day (TID) | SUBCUTANEOUS | Status: DC
  Administered 2018-09-26: 11 [IU] via SUBCUTANEOUS
  Administered 2018-09-27: 08:00:00 7 [IU] via SUBCUTANEOUS
  Administered 2018-09-27: 4 [IU] via SUBCUTANEOUS
  Administered 2018-09-28: 15 [IU] via SUBCUTANEOUS
  Administered 2018-09-28: 4 [IU] via SUBCUTANEOUS
  Administered 2018-09-29: 08:00:00 7 [IU] via SUBCUTANEOUS
  Administered 2018-09-29: 15 [IU] via SUBCUTANEOUS
  Administered 2018-09-30: 11 [IU] via SUBCUTANEOUS
  Administered 2018-09-30: 3 [IU] via SUBCUTANEOUS

## 2018-09-26 MED ORDER — ACETAMINOPHEN 325 MG PO TABS
650.0000 mg | ORAL_TABLET | Freq: Four times a day (QID) | ORAL | Status: DC | PRN
  Administered 2018-09-26 – 2018-09-30 (×5): 650 mg via ORAL
  Filled 2018-09-26 (×5): qty 2

## 2018-09-26 MED ORDER — INSULIN ASPART 100 UNIT/ML ~~LOC~~ SOLN
0.0000 [IU] | Freq: Every day | SUBCUTANEOUS | Status: DC
  Administered 2018-09-26: 4 [IU] via SUBCUTANEOUS
  Administered 2018-09-28: 22:00:00 3 [IU] via SUBCUTANEOUS
  Administered 2018-09-29: 21:00:00 2 [IU] via SUBCUTANEOUS

## 2018-09-26 MED ORDER — AMIODARONE HCL 200 MG PO TABS
200.0000 mg | ORAL_TABLET | Freq: Every day | ORAL | Status: DC
  Administered 2018-09-27 – 2018-09-30 (×4): 200 mg via ORAL
  Filled 2018-09-26 (×4): qty 1

## 2018-09-26 MED ORDER — IPRATROPIUM-ALBUTEROL 0.5-2.5 (3) MG/3ML IN SOLN: 3.0000 mL | Freq: Four times a day (QID) | RESPIRATORY_TRACT | Status: DC

## 2018-09-26 MED ORDER — ACETAMINOPHEN 650 MG RE SUPP: 650.0000 mg | Freq: Four times a day (QID) | RECTAL | Status: DC | PRN

## 2018-09-26 MED ORDER — POTASSIUM CHLORIDE CRYS ER 20 MEQ PO TBCR
20.0000 meq | EXTENDED_RELEASE_TABLET | Freq: Two times a day (BID) | ORAL | Status: DC
  Administered 2018-09-26 – 2018-09-28 (×4): 20 meq via ORAL
  Filled 2018-09-26 (×4): qty 1

## 2018-09-26 MED ORDER — SENNOSIDES-DOCUSATE SODIUM 8.6-50 MG PO TABS
2.0000 | ORAL_TABLET | Freq: Every evening | ORAL | Status: DC | PRN
  Filled 2018-09-26: qty 2

## 2018-09-26 MED ORDER — POLYETHYLENE GLYCOL 3350 17 G PO PACK: 17.0000 g | PACK | Freq: Every day | ORAL | Status: DC | PRN

## 2018-09-26 MED ORDER — LEVALBUTEROL HCL 1.25 MG/0.5ML IN NEBU
1.2500 mg | INHALATION_SOLUTION | Freq: Three times a day (TID) | RESPIRATORY_TRACT | Status: DC
  Administered 2018-09-26 (×2): 1.25 mg via RESPIRATORY_TRACT
  Filled 2018-09-26: qty 0.5

## 2018-09-26 MED ORDER — HYDRALAZINE HCL 20 MG/ML IJ SOLN: 10.0000 mg | INTRAMUSCULAR | Status: DC | PRN

## 2018-09-26 MED ORDER — SALINE SPRAY 0.65 % NA SOLN
1.0000 | NASAL | Status: DC | PRN
  Filled 2018-09-26: qty 44

## 2018-09-26 MED ORDER — MAGNESIUM SULFATE 2 GM/50ML IV SOLN
2.0000 g | Freq: Once | INTRAVENOUS | Status: AC
  Administered 2018-09-26: 2 g via INTRAVENOUS
  Filled 2018-09-26: qty 50

## 2018-09-26 MED ORDER — PREDNISONE 10 MG PO TABS: 10.0000 mg | ORAL_TABLET | Freq: Every day | ORAL | Status: DC

## 2018-09-26 MED ORDER — AEROCHAMBER PLUS FLO-VU MEDIUM MISC
1.0000 | Freq: Once | Status: AC
  Administered 2018-09-26: 14:00:00 1
  Filled 2018-09-26: qty 1

## 2018-09-26 MED ORDER — ALBUTEROL SULFATE HFA 108 (90 BASE) MCG/ACT IN AERS
6.0000 | INHALATION_SPRAY | Freq: Once | RESPIRATORY_TRACT | Status: AC
  Administered 2018-09-26: 6 via RESPIRATORY_TRACT
  Filled 2018-09-26: qty 6.7

## 2018-09-26 MED ORDER — ACETAMINOPHEN 325 MG PO TABS
650.0000 mg | ORAL_TABLET | Freq: Once | ORAL | Status: AC
  Administered 2018-09-26: 16:00:00 650 mg via ORAL
  Filled 2018-09-26: qty 2

## 2018-09-26 MED ORDER — LEVALBUTEROL HCL 1.25 MG/0.5ML IN NEBU
INHALATION_SOLUTION | RESPIRATORY_TRACT | Status: AC
  Administered 2018-09-26: 17:00:00 1.25 mg via RESPIRATORY_TRACT
  Filled 2018-09-26: qty 0.5

## 2018-09-26 MED ORDER — RIVAROXABAN 20 MG PO TABS
20.0000 mg | ORAL_TABLET | Freq: Every day | ORAL | Status: DC
  Administered 2018-09-26 – 2018-09-29 (×4): 20 mg via ORAL
  Filled 2018-09-26 (×5): qty 1

## 2018-09-26 MED ORDER — METHYLPREDNISOLONE SODIUM SUCC 40 MG IJ SOLR
40.0000 mg | Freq: Three times a day (TID) | INTRAMUSCULAR | Status: AC
  Administered 2018-09-26 – 2018-09-28 (×7): 40 mg via INTRAVENOUS
  Filled 2018-09-26 (×7): qty 1

## 2018-09-26 MED ORDER — ORAL CARE MOUTH RINSE
15.0000 mL | Freq: Two times a day (BID) | OROMUCOSAL | Status: DC
  Administered 2018-09-27 – 2018-09-30 (×7): 15 mL via OROMUCOSAL

## 2018-09-26 MED ORDER — GUAIFENESIN-DM 100-10 MG/5ML PO SYRP: 5.0000 mL | ORAL_SOLUTION | ORAL | Status: DC | PRN

## 2018-09-26 MED ORDER — HYDROCORTISONE 1 % EX CREA
1.0000 "application " | TOPICAL_CREAM | Freq: Three times a day (TID) | CUTANEOUS | Status: DC | PRN
  Filled 2018-09-26: qty 28

## 2018-09-26 MED ORDER — IPRATROPIUM BROMIDE 0.02 % IN SOLN
0.5000 mg | Freq: Three times a day (TID) | RESPIRATORY_TRACT | Status: DC
  Administered 2018-09-27 – 2018-09-30 (×10): 0.5 mg via RESPIRATORY_TRACT
  Filled 2018-09-26 (×10): qty 2.5

## 2018-09-26 MED ORDER — LORATADINE 10 MG PO TABS: 10.0000 mg | ORAL_TABLET | Freq: Every day | ORAL | Status: DC | PRN

## 2018-09-26 MED ORDER — METHYLPREDNISOLONE SODIUM SUCC 125 MG IJ SOLR
125.0000 mg | Freq: Once | INTRAMUSCULAR | Status: AC
  Administered 2018-09-26: 125 mg via INTRAVENOUS
  Filled 2018-09-26: qty 2

## 2018-09-26 MED ORDER — HYDROCORTISONE (PERIANAL) 2.5 % EX CREA
1.0000 "application " | TOPICAL_CREAM | Freq: Four times a day (QID) | CUTANEOUS | Status: DC | PRN
  Filled 2018-09-26: qty 28.35

## 2018-09-26 NOTE — ED Provider Notes (Signed)
[facility] EMERGENCY DEPARTMENT Provider Note   CSN: [id number] Arrival date & time: 09/26/18  1254    History   Chief Complaint Chief Complaint  Patient presents with  . Shortness of Breath    HPI Eileen [name] is a 73 y.o. female with a hx of COPD, chronic respiratory failure on 3L via [place] @ baseline,  Aflutter on chronic anticoagulation s/p cardioversion 06/2018, T2DM, HTN, prior GI bleed, & prior breast cancer who presents to the ED from her pulmonologist office with complaints of dyspnea x 4 days. Patient reports dyspnea is constant, worse with exertion, minimally alleviated w/ albuterol nebs @ home (minimal use as these make her jittery) on her baseline 3L. She notes associated mild wheezing & BLE swelling/tightness. She feels this is similar to prior COPD exacerbations. Denies fever, cough, chest pain, abdominal pain, or N/V/D. No known covid 19 exposures.   Patient's pulmonologist Dr. Luan Pulling called the ED & spoke w/ ED attending Dr. Roderic Palau- patient present in clinic, concern for COPD and/or CHF exacerbation, SpO2 in the mid 80s on her baseline oxygen, sent for admission.    HPI  Past Medical History:  Diagnosis Date  . Asthma   . Atrial flutter ([place])   . Cancer [facility])    Right breast  . COPD (chronic obstructive pulmonary disease) ([place])   . History of breast cancer    right breast  . Hypertension   . On home O2   . Type 2 diabetes mellitus [facility])    Patient Active Problem List   Diagnosis Date Noted  . Osteoporosis 08/30/2018  . Closed compression fracture of body of L1 vertebra ([place])   . Constipation   . Acute and chronic respiratory failure (acute-on-chronic) ([place]) 08/10/2018  . Left lower lobe pneumonia ([place]) 08/10/2018  . Leukocytosis 08/10/2018  . Chronic respiratory failure with hypoxia ([place]) 06/28/2018  . Acute upper GI bleeding 01/20/2018  . Lower GI bleed 01/20/2018  . Acute respiratory failure with hypoxia ([place]) 09/17/2017  . Diabetes mellitus ([place])  08/31/2017  . Acute hypoxemic respiratory failure ([place]) 08/31/2017  . Hemorrhoids 10/30/2016  . Atrial fibrillation with RVR ([place]) 09/24/2016  . Atrial flutter ([place]) 09/23/2016  . Obstructive chronic bronchitis with exacerbation ([place]) 05/23/2016  . Vitamin D deficiency 05/23/2016  . Acute on chronic respiratory failure ([place]) 03/24/2015  . Metabolic encephalopathy 25/07/3974  . HCAP (healthcare-associated pneumonia) 03/24/2015  . Anticoagulation management encounter   . New onset atrial flutter ([place]) 03/21/2015  . Obesity 03/21/2015  . Atrial flutter with rapid ventricular response ([place]) 03/21/2015  . COPD (chronic obstructive pulmonary disease) ([place]) 12/20/2014  . COPD with acute exacerbation ([place]) 12/20/2014  . 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 Breast cancer ([place]) 10/11/2009    Past Surgical History:  Procedure Laterality Date  . BREAST SURGERY  2011   Right breast mastectomy  . CARDIOVERSION N/A 06/27/2018   Procedure: CARDIOVERSION;  Surgeon: Arnoldo Lenis, MD;  Location: [facility] ENDO SUITE;  Service: Endoscopy;  Laterality: N/A;  . CESAREAN SECTION    . COLONOSCOPY N/A 01/22/2018   Procedure: COLONOSCOPY;  Surgeon: Rogene Houston, MD;  Location: [facility] ENDO SUITE;  Service: Endoscopy;  Laterality: N/A;  . HERNIA REPAIR     RIH  . IR VERTEBROPLASTY LUMBAR BX INC UNI/BIL INC/INJECT/IMAGING  09/05/2018  . MASTECTOMY  2011   right breast  . TEE WITHOUT CARDIOVERSION N/A 06/27/2018   Procedure: TRANSESOPHAGEAL ECHOCARDIOGRAM (TEE) WITH PROPOFOL;  Surgeon: Arnoldo Lenis, MD;  Location: [facility] ENDO SUITE;  Service: Endoscopy;  Laterality: N/A;     OB History    Gravida      Para      Term      Preterm      AB      Living  5     SAB      TAB      Ectopic      Multiple      Live Births               Home Medications    Prior to Admission medications   Medication Sig Start Date End Date  Taking? Authorizing Provider  acetaminophen (TYLENOL) 500 MG tablet Take 500 mg by mouth every 8 (eight) hours as needed for mild pain or headache.     Lavella Lemons, PA  albuterol (PROVENTIL) (2.5 MG/3ML) 0.083% nebulizer solution Take 2.5 mg by nebulization every 6 (six) hours as needed for wheezing or shortness of breath.    [provider]  amiodarone (PACERONE) 200 MG tablet Take 1 tablet (200 mg total) by mouth daily. 07/20/18   Strader, Fransisco Hertz, PA-C  blood glucose meter kit and supplies KIT Dispense based on patient and insurance preference. Use up to four times daily as directed. (FOR ICD-9 250.00, 250.01).  Check sugar before meals and at night, keep a log. 09/29/16   Arrien, Jimmy Picket, MD  docusate sodium (COLACE) 100 MG capsule Take 1 capsule (100 mg total) by mouth 2 (two) times daily. Patient taking differently: Take 100 mg by mouth daily.  08/22/18   Barton Dubois, MD  furosemide (LASIX) 40 MG tablet Take 80 mg by mouth daily.  07/10/18   [provider]  guaiFENesin (MUCINEX) 600 MG 12 hr tablet Take 1 tablet (600 mg total) by mouth 2 (two) times daily. 08/22/18   Barton Dubois, MD  metFORMIN (GLUCOPHAGE-XR) 500 MG 24 hr tablet Take 500 mg by mouth daily with breakfast.  08/10/17   [provider]  metoprolol tartrate (LOPRESSOR) 50 MG tablet Take 50 mg by mouth 2 (two) times daily. 04/12/18   [provider]  OXYGEN Inhale 2.5-3 L into the lungs continuous.     [provider]  polyethylene glycol (MIRALAX / GLYCOLAX) 17 g packet Take 17 g by mouth daily as needed for mild constipation. 08/22/18   Barton Dubois, MD  polyethylene glycol powder (GLYCOLAX/MIRALAX) 17 GM/SCOOP powder TAKE 17 GRAMS (1 CAPFUL) IN 8 OZ OF FLUID ONCE DAILY 08/22/18   [provider]  potassium chloride SA (K-DUR) 20 MEQ tablet Take 20 mEq by mouth 2 (two) times daily.    [provider]  predniSONE (DELTASONE) 10 MG tablet Take 10 mg by  mouth daily with breakfast.    [provider]  PROAIR HFA 108 (90 BASE) MCG/ACT inhaler Inhale 2 puffs into the lungs every 6 (six) hours as needed for wheezing or shortness of breath.  03/06/11   [provider]  rivaroxaban (XARELTO) 20 MG TABS tablet Take 1 tablet (20 mg total) by mouth daily with supper. Stop Xarelto until Monday (08/26/18) in anticipation for kyphoplasty 08/22/18   Barton Dubois, MD  TRELEGY ELLIPTA 100-62.5-25 MCG/INH AEPB Take 1 puff by mouth daily. 08/27/17   [provider]    Family History Family History  Problem Relation Age of Onset  . Cancer Mother        lung  . Diabetes  Brother     Social History Social History   Tobacco Use  . Smoking status: Former Smoker    Packs/day: 0.50    Years: 32.00    Pack years: 16.00    Types: Cigarettes    Start date: 12/12/1962    Quit date: 03/13/1994    Years since quitting: 24.5  . Smokeless tobacco: Never Used  Substance Use Topics  . Alcohol use: No    Alcohol/week: 0.0 standard drinks  . Drug use: No     Allergies   Codeine   Review of Systems Review of Systems  Constitutional: Negative for chills and fever.  HENT: Negative for ear pain and sore throat.   Respiratory: Positive for shortness of breath and wheezing. Negative for cough.   Cardiovascular: Positive for leg swelling. Negative for chest pain.  Gastrointestinal: Negative for abdominal pain, diarrhea, nausea and vomiting.  Genitourinary: Negative for dysuria.  Neurological: Negative for syncope.  All other systems reviewed and are negative.   Physical Exam Updated Vital Signs BP 125/69   Pulse 76   Temp 98.9 F (37.2 C) (Oral)   Resp 17   Ht 5' 3"  (1.6 m)   Wt 73.5 kg   SpO2 97%   BMI 28.70 kg/m   Physical Exam Vitals signs and nursing note reviewed.  Constitutional:      Appearance: She is well-developed.  HENT:     Head: Normocephalic and atraumatic.  Eyes:     General:        Right eye: No  discharge.        Left eye: No discharge.     Conjunctiva/sclera: Conjunctivae normal.  Neck:     Musculoskeletal: Neck supple.  Cardiovascular:     Rate and Rhythm: Normal rate and regular rhythm.  Pulmonary:     Effort: No tachypnea.     Comments: SpO2 per nursing staff 70% on her 3L via [place]- increased to 6L via [place] w/ improvement to mid 90s. I started to decrease patient's O2 & she desated to mid 80s, returned to 6Ls.  Extremely poor air movement throughout with tight breath sounds.  Abdominal:     General: There is no distension.     Palpations: Abdomen is soft.     Tenderness: There is no abdominal tenderness.  Musculoskeletal:     Comments: BLE symmetric 2+-1+ pitting edema to the feet & distal 1/3rd of the lower legs without overlying erythema/warmth. No calf tenderness.   Skin:    General: Skin is warm and dry.     Findings: No rash.  Neurological:     Mental Status: She is alert.     Comments: Clear speech.   Psychiatric:        Behavior: Behavior normal.    ED Treatments / Results  Labs (all labs ordered are listed, but only abnormal results are displayed) Labs Reviewed  COMPREHENSIVE METABOLIC PANEL - Abnormal; Notable for the following components:      Result Value   CO2 37 (*)    Glucose, Bld 238 (*)    All other components within normal limits  CBC WITH DIFFERENTIAL/PLATELET - Abnormal; Notable for the following components:   Hemoglobin 11.4 (*)    MCHC 28.7 (*)    Neutro Abs 8.2 (*)    All other components within normal limits  BRAIN NATRIURETIC PEPTIDE - Abnormal; Notable for the following components:   B Natriuretic Peptide 302.0 (*)    All other components within normal limits  SARS CORONAVIRUS 2 (HOSPITAL ORDER, [facility])    EKG EKG Interpretation  Date/Time:  Thursday September 26 2018 13:20:08 EDT Ventricular Rate:  76 PR Interval:    QRS Duration: 86 QT Interval:  415 QTC Calculation: 467 R Axis:   -11 Text  Interpretation:  Sinus rhythm Borderline repolarization abnormality Confirmed by Veryl Speak [phone]) on 09/26/2018 3:46:51 PM   Radiology Dg Chest Portable 1 View  Result Date: 09/26/2018 CLINICAL DATA:  Shortness of breath. EXAM: PORTABLE CHEST 1 VIEW COMPARISON:  Radiographs of August 20, 2018. FINDINGS: Stable cardiomediastinal silhouette. No pneumothorax is noted. Mild bibasilar subsegmental atelectasis is noted. Bony thorax is unremarkable. IMPRESSION: Mild bibasilar subsegmental atelectasis. Electronically Signed   By: Marijo Conception M.D.   On: 09/26/2018 14:13    Procedures Procedures (including critical care time) CRITICAL CARE Performed by: Kennith Maes   Total critical care time: 30 minutes  Critical care time was exclusive of separately billable procedures and treating other patients.  Critical care was necessary to treat or prevent imminent or life-threatening deterioration.  Critical care was time spent personally by me on the following activities: development of treatment plan with patient and/or surrogate as well as nursing, discussions with consultants, evaluation of patient's response to treatment, examination of patient, obtaining history from patient or surrogate, ordering and performing treatments and interventions, ordering and review of laboratory studies, ordering and review of radiographic studies, pulse oximetry and re-evaluation of patient's condition.  Medications Ordered in ED Medications  methylPREDNISolone sodium succinate (SOLU-MEDROL) 125 mg/2 mL injection 125 mg (125 mg Intravenous Given 09/26/18 1415)  magnesium sulfate IVPB 2 g 50 mL (0 g Intravenous Stopped 09/26/18 1515)  albuterol (VENTOLIN HFA) 108 (90 Base) MCG/ACT inhaler 6 puff (6 puffs Inhalation Given 09/26/18 1401)  ipratropium (ATROVENT HFA) inhaler 2 puff (2 puffs Inhalation Given 09/26/18 1401)  AeroChamber Plus Flo-Vu Medium MISC 1 each (1 each Other Given 09/26/18 1402)     Initial  Impression / Assessment and Plan / ED Course  I have reviewed the triage vital signs and the nursing notes.  Pertinent labs & imaging results that were available during my care of the patient were reviewed by me and considered in my medical decision making (see chart for details).   Patient presents to the ED w/ dyspnea. She has a hx of COPD w/ chronic respiratory failure on 3L via [place] @ baseline. Sent by pulmonology for admission.   Nontoxic appearing, she is hypoxic to as low as 70% on her baseilne 3L via [place] per nursing staff- improved to lower to mid 90s on 6L via . She has extremely poor air movement with tight breath sounds throughout as well as bilateral lower extremity edema.  DDX: COPD exacerbation, bacterial pneumonia, covid 57, CHF, PE, anemia.  Plan: 8 puffs albuterol & 2 puffs atrovent w/ spacer, mag, solumedrol; hold off on nebs given unknown covd status.   CBC: No leukocytosis. Anemia similar to prior.  CMP: Hyperglycemia w/ elevated bicarb. No electrolyte derangement.  BNP: Elevated @ 302.0  CXR: Mild bibasilar subsegmental atelectasis COVID: Negative EKG: Sinus, no STEMI    No infiltrate on CXR to suggest pneumonia COVID negative Patient reports compliance on her anticoagulation making PE less likely CXR w/o fluid overload, BNP mildly elevated  Suspect primarily COPD exacerbation On re-assessment, remains w/ increased oxygen requirement above her baseline & w/ poor air movement. Will consult for admission.   15:44: CONSULT: Discussed w/ hospitalist Dr. Reesa Chew who  accepts admission.   Findings and plan of care discussed with supervising physician Dr. Roderic Palau who has evaluated patient, provided guidance & is in agreement.   Eileen A Kight was evaluated in Emergency Department on 09/26/2018 for the symptoms described in the history of present illness. He/she was evaluated in the context of the global COVID-19 pandemic, which necessitated consideration that the patient might  be at risk for infection with the SARS-CoV-2 virus that causes COVID-19. Institutional protocols and algorithms that pertain to the evaluation of patients at risk for COVID-19 are in a state of rapid change based on information released by regulatory bodies including the CDC and federal and state organizations. These policies and algorithms were followed during the patient's care in the ED.  Final Clinical Impressions(s) / ED Diagnoses   Final diagnoses:  COPD exacerbation [facility])    ED Discharge Orders    None       Amaryllis Dyke, PA-C 09/26/18 1548    Milton Ferguson, MD 09/28/18 [phone]

## 2018-09-26 NOTE — Progress Notes (Signed)
Airborne and contact precautions discontinued at this time due to NEGATIVE Covid testing. Dr. Reesa Chew, admitting physician, gave verbal order to discontinue .

## 2018-09-26 NOTE — H&P (Signed)
History and Physical    Eileen A Risse VEL:[id number] DOB: 1945-11-26 DOA: 09/26/2018  PCP: Lavella Lemons, PA Patient coming from: Pulmonary, Dr. Luan Pulling office  Chief Complaint: Shortness of breath  HPI: Eileen [name] is a 73 y.o. female with medical history significant of with history of chronic hypoxia on 3 L nasal cannula, COPD, diabetes mellitus type 2, atrial flutter on Xarelto, previous history of breast cancer and GI bleed was sent from pulmonary, Dr. Luan Pulling office for evaluation of shortness of breath.  Patient stated that she had been feeling short of breath for the past 4 days which has progressively worsened especially with exertion.  At baseline she uses 3 L nasal cannula.  Denies any fevers, chills and other complaints.  She also denied any exposure to COVID-19.  She was admitted here about a month ago for similar reasons.  In the ER patient was noted to be quite hypoxic saturating 70% on 3 L nasal cannula with diminished breath sounds bilaterally.  It was increased to 6 L nasal cannula with saturation greater than 90%.  She was started on bronchodilators and steroids.  Call with test was negative.  Chest x-ray showed some bibasilar atelectasis.  Lives at home with her daughter Quit smoking cigarettes in [place]: As per HPI otherwise 10 point review of systems negative.  Review of Systems Otherwise negative except as per HPI, including: General: Denies fever, chills, night sweats or unintended weight loss. Resp: Denies hemoptysis Cardiac: Denies chest pain, palpitations, orthopnea, paroxysmal nocturnal dyspnea. GI: Denies abdominal pain, nausea, vomiting, diarrhea or constipation GU: Denies dysuria, frequency, hesitancy or incontinence MS: Denies muscle aches, joint pain or swelling Neuro: Denies headache, neurologic deficits (focal weakness, numbness, tingling), abnormal gait Psych: Denies anxiety, depression, SI/HI/AVH Skin: Denies new rashes or  lesions ID: Denies sick contacts, exotic exposures, travel  Past Medical History:  Diagnosis Date  . Asthma   . Atrial flutter ([place])   . Cancer [facility])    Right breast  . COPD (chronic obstructive pulmonary disease) ([place])   . History of breast cancer    right breast  . Hypertension   . On home O2   . Type 2 diabetes mellitus ([place])     Past Surgical History:  Procedure Laterality Date  . BREAST SURGERY  2011   Right breast mastectomy  . CARDIOVERSION N/A 06/27/2018   Procedure: CARDIOVERSION;  Surgeon: Arnoldo Lenis, MD;  [facility];  Service: Endoscopy;  Laterality: N/A;  . CESAREAN SECTION    . COLONOSCOPY N/A 01/22/2018   Procedure: COLONOSCOPY;  Surgeon: Rogene Houston, MD;  Location: [facility];  Service: Endoscopy;  Laterality: N/A;  . HERNIA REPAIR     RIH  . IR VERTEBROPLASTY LUMBAR BX INC UNI/BIL INC/INJECT/IMAGING  09/05/2018  . MASTECTOMY  2011   right breast  . TEE WITHOUT CARDIOVERSION N/A 06/27/2018   Procedure: TRANSESOPHAGEAL ECHOCARDIOGRAM (TEE) WITH PROPOFOL;  Surgeon: Arnoldo Lenis, MD;  Location: [facility];  Service: Endoscopy;  Laterality: N/A;    SOCIAL HISTORY:  reports that she quit smoking about 24 years ago. Her smoking use included cigarettes. She started smoking about 55 years ago. She has a 16.00 pack-year smoking history. She has never used smokeless tobacco. She reports that she does not drink alcohol or use drugs.  Allergies  Allergen Reactions  . Codeine Other (See Comments)    "jittery"    FAMILY HISTORY: Family History  Problem Relation Age  of Onset  . Cancer Mother        lung  . Diabetes Brother      Prior to Admission medications   Medication Sig Start Date End Date Taking? Authorizing Provider  acetaminophen (TYLENOL) 500 MG tablet Take 500 mg by mouth every 8 (eight) hours as needed for mild pain or headache.    Yes Lavella Lemons, PA  albuterol (PROVENTIL) (2.5 MG/3ML) 0.083% nebulizer solution  Take 2.5 mg by nebulization every 6 (six) hours as needed for wheezing or shortness of breath.   Yes [provider]  amiodarone (PACERONE) 200 MG tablet Take 1 tablet (200 mg total) by mouth daily. 07/20/18  Yes Strader, [place] M, PA-C  docusate sodium (COLACE) 100 MG capsule Take 1 capsule (100 mg total) by mouth 2 (two) times daily. Patient taking differently: Take 100 mg by mouth daily.  08/22/18  Yes Barton Dubois, MD  furosemide (LASIX) 40 MG tablet Take 80 mg by mouth daily.  07/10/18  Yes [provider]  metFORMIN (GLUCOPHAGE-XR) 500 MG 24 hr tablet Take 500 mg by mouth daily with breakfast.  08/10/17  Yes [provider]  metoprolol tartrate (LOPRESSOR) 50 MG tablet Take 50 mg by mouth 2 (two) times daily. 04/12/18  Yes [provider]  OXYGEN Inhale 2.5-3 L into the lungs continuous.    Yes [provider]  polyethylene glycol (MIRALAX / GLYCOLAX) 17 g packet Take 17 g by mouth daily as needed for mild constipation. 08/22/18  Yes Barton Dubois, MD  potassium chloride SA (K-DUR) 20 MEQ tablet Take 20 mEq by mouth 2 (two) times daily.   Yes [provider]  predniSONE (DELTASONE) 10 MG tablet Take 10 mg by mouth daily with breakfast.   Yes [provider]  PROAIR HFA 108 (90 BASE) MCG/ACT inhaler Inhale 2 puffs into the lungs every 6 (six) hours as needed for wheezing or shortness of breath.  03/06/11  Yes [provider]  rivaroxaban (XARELTO) 20 MG TABS tablet Take 1 tablet (20 mg total) by mouth daily with supper. Stop Xarelto until Monday (08/26/18) in anticipation for kyphoplasty 08/22/18  Yes Barton Dubois, MD  terconazole (TERAZOL 3) 0.8 % vaginal cream Place 1 applicator vaginally at bedtime.  09/24/18  Yes [provider]  TRELEGY ELLIPTA 100-62.5-25 MCG/INH AEPB Take 1 puff by mouth daily. 08/27/17  Yes [provider]  guaiFENesin (MUCINEX) 600 MG 12 hr tablet Take 1 tablet (600 mg total) by mouth 2  (two) times daily. Patient not taking: Reported on 09/26/2018 08/22/18   Barton Dubois, MD    Physical Exam: Vitals:   09/26/18 1415 09/26/18 1500 09/26/18 1545 09/26/18 1607  BP:  119/71    Pulse: 77 77 85 85  Resp:      Temp:      TempSrc:      SpO2: 98% 98% 93% 93%  Weight:      Height:          Constitutional: NAD, calm, comfortable Eyes: PERRL, lids and conjunctivae normal ENMT: Mucous membranes are moist. Posterior pharynx clear of any exudate or lesions.Normal dentition.  Neck: normal, supple, no masses, no thyromegaly Respiratory: Diffuse diminished breath sounds Cardiovascular: Regular rate and rhythm, no murmurs / rubs / gallops.  2+ bilateral lower extremity pitting edema. 2+ pedal pulses. No carotid bruits.  Abdomen: no tenderness, no masses palpated. No hepatosplenomegaly. Bowel sounds positive.  Musculoskeletal: no clubbing / cyanosis. No joint deformity upper and lower extremities. Good  ROM, no contractures. Normal muscle tone.  Skin: no rashes, lesions, ulcers. No induration Neurologic: CN 2-12 grossly intact. Sensation intact, DTR normal. Strength 5/5 in all 4.  Psychiatric: Normal judgment and insight. Alert and oriented x 3. Normal mood.     Labs on Admission: I have personally reviewed following labs and imaging studies  CBC: Recent Labs  Lab 09/26/18 1419  WBC 9.9  NEUTROABS 8.2*  HGB 11.4*  HCT 39.7  MCV 99.5  PLT 932   Basic Metabolic Panel: Recent Labs  Lab 09/26/18 1419  NA 144  K 4.0  CL 98  CO2 37*  GLUCOSE 238*  BUN 13  CREATININE 0.77  CALCIUM 9.0   GFR: Estimated Creatinine Clearance: 60.1 mL/min (by C-G formula based on SCr of 0.77 mg/dL). Liver Function Tests: Recent Labs  Lab 09/26/18 1419  AST 24  ALT 20  ALKPHOS 111  BILITOT 0.4  PROT 6.7  ALBUMIN 3.8   No results for input(s): LIPASE, AMYLASE in the last 168 hours. No results for input(s): AMMONIA in the last 168 hours. Coagulation Profile: No results for  input(s): INR, PROTIME in the last 168 hours. Cardiac Enzymes: No results for input(s): CKTOTAL, CKMB, CKMBINDEX, TROPONINI in the last 168 hours. BNP (last 3 results) No results for input(s): PROBNP in the last 8760 hours. HbA1C: No results for input(s): HGBA1C in the last 72 hours. CBG: No results for input(s): GLUCAP in the last 168 hours. Lipid Profile: No results for input(s): CHOL, HDL, LDLCALC, TRIG, CHOLHDL, LDLDIRECT in the last 72 hours. Thyroid Function Tests: No results for input(s): TSH, T4TOTAL, FREET4, T3FREE, THYROIDAB in the last 72 hours. Anemia Panel: No results for input(s): VITAMINB12, FOLATE, FERRITIN, TIBC, IRON, RETICCTPCT in the last 72 hours. Urine analysis:    Component Value Date/Time   COLORURINE YELLOW 09/05/2018 0850   APPEARANCEUR CLEAR 09/05/2018 0850   LABSPEC 1.032 (H) 09/05/2018 0850   PHURINE 5.0 09/05/2018 0850   GLUCOSEU >=500 (A) 09/05/2018 0850   HGBUR NEGATIVE 09/05/2018 0850   BILIRUBINUR NEGATIVE 09/05/2018 0850   KETONESUR 5 (A) 09/05/2018 0850   PROTEINUR 100 (A) 09/05/2018 0850   UROBILINOGEN 0.2 09/09/2009 1010   NITRITE NEGATIVE 09/05/2018 0850   LEUKOCYTESUR NEGATIVE 09/05/2018 0850   Sepsis Labs: !!!!!!!!!!!!!!!!!!!!!!!!!!!!!!!!!!!!!!!!!!!! @LABRCNTIP (procalcitonin:4,lacticidven:4) ) Recent Results (from the past 240 hour(s))  SARS Coronavirus 2 (CEPHEID- Performed in [place] hospital lab), Hosp Order     Status: None   Collection Time: 09/26/18  2:18 PM   Specimen: Nasopharyngeal Swab  Result Value Ref Range Status   SARS Coronavirus 2 NEGATIVE NEGATIVE Final    Comment: (NOTE) If result is NEGATIVE SARS-CoV-2 target nucleic acids are NOT DETECTED. The SARS-CoV-2 RNA is generally detectable in upper and lower  respiratory specimens during the acute phase of infection. The lowest  concentration of SARS-CoV-2 viral copies this assay can detect is 250  copies / mL. A negative result does not preclude SARS-CoV-2  infection  and should not be used as the sole basis for treatment or other  patient management decisions.  A negative result may occur with  improper specimen collection / handling, submission of specimen other  than nasopharyngeal swab, presence of viral mutation(s) within the  areas targeted by this assay, and inadequate number of viral copies  (<250 copies / mL). A negative result must be combined with clinical  observations, patient history, and epidemiological information. If result is POSITIVE SARS-CoV-2 target nucleic acids are DETECTED. The SARS-CoV-2 RNA is generally detectable  in upper and lower  respiratory specimens dur ing the acute phase of infection.  Positive  results are indicative of active infection with SARS-CoV-2.  Clinical  correlation with patient history and other diagnostic information is  necessary to determine patient infection status.  Positive results do  not rule out bacterial infection or co-infection with other viruses. If result is PRESUMPTIVE POSTIVE SARS-CoV-2 nucleic acids MAY BE PRESENT.   A presumptive positive result was obtained on the submitted specimen  and confirmed on repeat testing.  While 2019 novel coronavirus  (SARS-CoV-2) nucleic acids may be present in the submitted sample  additional confirmatory testing may be necessary for epidemiological  and / or clinical management purposes  to differentiate between  SARS-CoV-2 and other Sarbecovirus currently known to infect humans.  If clinically indicated additional testing with an alternate test  methodology [phone]) is advised. The SARS-CoV-2 RNA is generally  detectable in upper and lower respiratory sp ecimens during the acute  phase of infection. The expected result is Negative. Fact Sheet for Patients:  StrictlyIdeas.no Fact Sheet for Healthcare Providers: BankingDealers.co.za This test is not yet approved or cleared by the [place]  FDA and has been authorized for detection and/or diagnosis of SARS-CoV-2 by FDA under an Emergency Use Authorization (EUA).  This EUA will remain in effect (meaning this test can be used) for the duration of the COVID-19 declaration under Section 564(b)(1) of the Act, 21 U.S.C. section 360bbb-3(b)(1), unless the authorization is terminated or revoked sooner. Performed at [facility], [address] [place], [place] [address]      Radiological Exams on Admission: Dg Chest Portable 1 View  Result Date: 09/26/2018 CLINICAL DATA:  Shortness of breath. EXAM: PORTABLE CHEST 1 VIEW COMPARISON:  Radiographs of August 20, 2018. FINDINGS: Stable cardiomediastinal silhouette. No pneumothorax is noted. Mild bibasilar subsegmental atelectasis is noted. Bony thorax is unremarkable. IMPRESSION: Mild bibasilar subsegmental atelectasis. Electronically Signed   By: Marijo Conception M.D.   On: 09/26/2018 14:13     All images have been reviewed by me personally.   Assessment/Plan Principal Problem:   COPD with acute exacerbation (HCC) Active Problems:   Acute on chronic respiratory failure (HCC)   Atrial flutter (HCC)   Closed compression fracture of body of L1 vertebra (HCC)   Constipation   Osteoporosis    Acute on chronic hypoxic respiratory failure, 6 L nasal cannula Acute exacerbation of COPD, moderate to severe -Admit to stepdown unit.  Baseline 3 L nasal cannula, currently requiring 6 L nasal cannula -Nebulizers standing and as needed ordered, IV Solu-Medrol which we will eventually transition to oral prednisone - Accu-Cheks and sliding scale while on steroids -Chest x-ray-bibasilar atelectasis.  Incentive spirometer and flutter valve. -Antibiotics= none, checking procalcitonin - Antitussives as needed -Supplemental oxygen as needed, check ambulatory pulse ox if needed - Continue to provide supportive care - COVID= neg  Diabetes mellitus type 2 - Insulin sliding scale Accu-Chek.  Home  metformin on hold.  History of atrial flutter - Continue home medications-amiodarone and metoprolol, on Xarelto.  Chronic compression fracture of L1 -Pain control.  Bowel regimen PRN.   DVT prophylaxis: Xarelto Code Status: Full code Family Communication: None at bedside Disposition Plan: Stepdown unit admission, requiring 6 L nasal cannula Consults called: None Admission status: Admit to stepdown.   Time Spent: 65 minutes.  >50% of the time was devoted to discussing the patients care, assessment, plan and disposition with other care givers along with counseling the patient about the  risks and benefits of treatment.    Kayin Kettering Arsenio Loader MD [facility]  If 7PM-7AM, please contact night-coverage [email or web address]  09/26/2018, 4:25 PM

## 2018-09-26 NOTE — ED Triage Notes (Signed)
Increased sob x past few weeks.  sats in 70's on 3 1/2 liters which pt wears all the time.

## 2018-09-27 ENCOUNTER — Encounter (HOSPITAL_COMMUNITY): Payer: Self-pay

## 2018-09-27 ENCOUNTER — Inpatient Hospital Stay (HOSPITAL_COMMUNITY): Payer: Medicare Other

## 2018-09-27 DIAGNOSIS — J9621 Acute and chronic respiratory failure with hypoxia: Secondary | ICD-10-CM

## 2018-09-27 DIAGNOSIS — I4892 Unspecified atrial flutter: Secondary | ICD-10-CM

## 2018-09-27 LAB — GLUCOSE, CAPILLARY
Glucose-Capillary: 239 mg/dL — ABNORMAL HIGH (ref 70–99)
Glucose-Capillary: 179 mg/dL — ABNORMAL HIGH (ref 70–99)
Glucose-Capillary: 100 mg/dL — ABNORMAL HIGH (ref 70–99)
Glucose-Capillary: 197 mg/dL — ABNORMAL HIGH (ref 70–99)

## 2018-09-27 LAB — BASIC METABOLIC PANEL
Anion gap: 12 (ref 5–15)
BUN: 15 mg/dL (ref 8–23)
CO2: 40 mmol/L — ABNORMAL HIGH (ref 22–32)
Calcium: 9 mg/dL (ref 8.9–10.3)
Chloride: 92 mmol/L — ABNORMAL LOW (ref 98–111)
Creatinine, Ser: 0.61 mg/dL (ref 0.44–1.00)
GFR calc Af Amer: >60 mL/min (ref >60)
GFR calc non Af Amer: >60 mL/min (ref >60)
Glucose, Bld: 265 mg/dL — ABNORMAL HIGH (ref 70–99)
Potassium: 4 mmol/L (ref 3.5–5.1)
Sodium: 144 mmol/L (ref 135–145)

## 2018-09-27 LAB — MRSA PCR SCREENING: MRSA by PCR: NEGATIVE

## 2018-09-27 LAB — MAGNESIUM: Magnesium: 2 mg/dL (ref 1.7–2.4)

## 2018-09-27 LAB — CBC
HCT: 39.7 % (ref 36.0–46.0)
Hemoglobin: 11.6 g/dL — ABNORMAL LOW (ref 12.0–15.0)
MCH: 28.6 pg (ref 26.0–34.0)
MCHC: 29.2 g/dL — ABNORMAL LOW (ref 30.0–36.0)
MCV: 97.8 fL (ref 80.0–100.0)
Platelets: 332 10*3/uL (ref 150–400)
RBC: 4.06 MIL/uL (ref 3.87–5.11)
RDW: 13.9 % (ref 11.5–15.5)
WBC: 12 10*3/uL — ABNORMAL HIGH (ref 4.0–10.5)
nRBC: 0 % (ref 0.0–0.2)

## 2018-09-27 MED ORDER — IOHEXOL 350 MG/ML SOLN
75.0000 mL | Freq: Once | INTRAVENOUS | Status: AC | PRN
  Administered 2018-09-27: 15:00:00 75 mL via INTRAVENOUS

## 2018-09-27 MED ORDER — INSULIN DETEMIR 100 UNIT/ML ~~LOC~~ SOLN
10.0000 [IU] | Freq: Every day | SUBCUTANEOUS | Status: DC
  Administered 2018-09-27 – 2018-09-28 (×2): 10 [IU] via SUBCUTANEOUS
  Filled 2018-09-27 (×4): qty 0.1

## 2018-09-27 NOTE — Care Management Important Message (Signed)
Important Message  Patient Details  Name: Eileen [name] MRN: [id number] Date of Birth: 10/09/1945   Medicare Important Message Given:  Yes     [name] 09/27/2018, 4:23 PM

## 2018-09-27 NOTE — Progress Notes (Signed)
PROGRESS NOTE  [name] Reagle QVZ:[id number] DOB: [birth year] DOA: 09/26/2018 PCP: Lavella Lemons, PA  Brief History:  73 year old female with a history of COPD, right breast cancer in remission, chronic respiratory failure on 3 L, atrial flutter on rivaroxaban, breast cancer, diabetes mellitus presenting with shortness of breath for the past 3 days.  The patient denied any fevers, chills, chest pain, nausea, vomiting, diarrhea, abdominal pain, headache, neck pain, hemoptysis.  She states that she was compliant with all her medications at home.  She has had 2 recent hospital admissions for COPD exacerbation.  Most recently, the patient was recently discharged from 08/19/2020 08/22/2018.  In addition, the patient had an admission from 08/10/2018 through 08/19/2018 pneumonia and COPD exacerbation. The patient followed up with her pulmonologist, Dr. Luan Pulling.  The patient was noted to be hypoxic with oxygen saturation of 70% on 3L.  As result, the patient was sent to the emergency department for further evaluation.  In the emergency department, the patient was afebrile hemodynamically stable.  BMP and CBC were essentially unremarkable with her hemoglobin at baseline.  Chest x-ray showed bibasilar atelectasis.  The patient was started on Solu-Medrol and bronchodilators.  Assessment/Plan: Acute on chronic respiratory failure with hypoxia -Secondary to COPD exacerbation -Wean oxygen back to baseline as tolerated for saturation greater 92%  COPD exacerbation -Continue Xopenex and Atrovent -Continue Solu-Medrol -Continue Pulmicort  Essential hypertension -Continue metoprolol tartrate  Atrial flutter -Continue amiodarone -Continue metoprolol tartrate -Continue rivaroxaban -currently in sinus  Diabetes mellitus type 2, uncontrolled with hyperglycemia -Recheck hemoglobin A1c -06/24/2018 hemoglobin A1c 8.6 -Resistant NovoLog sliding scale -Pre-meal insulin -holding metformin -add levemir 10  units -Anticipate elevated CBGs secondary to steroids  Chronic L1 compression fracture -PT evaluation      Disposition Plan:   Home in 2-3 days  Family Communication:   No Family at bedside  Consultants:  none  Code Status:  FULL  DVT Prophylaxis:  Xarelto   Procedures: As Listed in Progress Note Above  Antibiotics: None       Subjective: Patient denies fevers, chills, headache, chest pain,  nausea, vomiting, diarrhea, abdominal pain, dysuria, hematuria, hematochezia, and melena.  She complains of dyspnea on exertion and nonproductive cough   Objective: Vitals:   09/27/18 0800 09/27/18 0812 09/27/18 0916 09/27/18 1000  BP: 103/71  (!) 110/54 114/65  Pulse: 72  90 79  Resp: 20   (!) 21  Temp:  98.4 F (36.9 C)    TempSrc:  Oral    SpO2: 97%   91%  Weight:      Height:        Intake/Output Summary (Last 24 hours) at 09/27/2018 1054 Last data filed at 09/27/2018 0700 Gross per 24 hour  Intake 487.21 ml  Output 2800 ml  Net -2312.79 ml   Weight change:  Exam:   General:  Pt is alert, follows commands appropriately, not in acute distress  HEENT: No icterus, No thrush, No neck mass, [place]/AT  Cardiovascular: RRR, S1/S2, no rubs, no gallops  Respiratory: diminished BS bilateral.  Bilateral rales  Abdomen: Soft/+BS, non tender, non distended, no guarding  Extremities: trace LE edema, No lymphangitis, No petechiae, No rashes, no synovitis   Data Reviewed: I have personally reviewed following labs and imaging studies Basic Metabolic Panel: Recent Labs  Lab 09/26/18 1419 09/27/18 0609  NA 144 144  K 4.0 4.0  CL 98 92*  CO2 37* 40*  GLUCOSE 238* 265*  BUN 13 15  CREATININE 0.77 0.61  CALCIUM 9.0 9.0  MG  --  2.0   Liver Function Tests: Recent Labs  Lab 09/26/18 1419  AST 24  ALT 20  ALKPHOS 111  BILITOT 0.4  PROT 6.7  ALBUMIN 3.8   No results for input(s): LIPASE, AMYLASE in the last 168 hours. No results for input(s): AMMONIA in  the last 168 hours. Coagulation Profile: No results for input(s): INR, PROTIME in the last 168 hours. CBC: Recent Labs  Lab 09/26/18 1419 09/27/18 0609  WBC 9.9 12.0*  NEUTROABS 8.2*  --   HGB 11.4* 11.6*  HCT 39.7 39.7  MCV 99.5 97.8  PLT 312 332   Cardiac Enzymes: No results for input(s): CKTOTAL, CKMB, CKMBINDEX, TROPONINI in the last 168 hours. BNP: Invalid input(s): POCBNP CBG: Recent Labs  Lab 09/26/18 1814 09/26/18 2148 09/27/18 0758  GLUCAP 252* 322* 239*   HbA1C: No results for input(s): HGBA1C in the last 72 hours. Urine analysis:    Component Value Date/Time   COLORURINE YELLOW 09/05/2018 0850   APPEARANCEUR CLEAR 09/05/2018 0850   LABSPEC 1.032 (H) 09/05/2018 0850   PHURINE 5.0 09/05/2018 0850   GLUCOSEU >=500 (A) 09/05/2018 0850   HGBUR NEGATIVE 09/05/2018 0850   BILIRUBINUR NEGATIVE 09/05/2018 0850   KETONESUR 5 (A) 09/05/2018 0850   PROTEINUR 100 (A) 09/05/2018 0850   UROBILINOGEN 0.2 09/09/2009 1010   NITRITE NEGATIVE 09/05/2018 0850   LEUKOCYTESUR NEGATIVE 09/05/2018 0850   Sepsis Labs: @LABRCNTIP (procalcitonin:4,lacticidven:4) ) Recent Results (from the past 240 hour(s))  SARS Coronavirus 2 (CEPHEID- Performed in [facility] hospital lab), Hosp Order     Status: None   Collection Time: 09/26/18  2:18 PM   Specimen: Nasopharyngeal Swab  Result Value Ref Range Status   SARS Coronavirus 2 NEGATIVE NEGATIVE Final    Comment: (NOTE) If result is NEGATIVE SARS-CoV-2 target nucleic acids are NOT DETECTED. The SARS-CoV-2 RNA is generally detectable in upper and lower  respiratory specimens during the acute phase of infection. The lowest  concentration of SARS-CoV-2 viral copies this assay can detect is 250  copies / mL. A negative result does not preclude SARS-CoV-2 infection  and should not be used as the sole basis for treatment or other  patient management decisions.  A negative result may occur with  improper specimen collection / handling,  submission of specimen other  than nasopharyngeal swab, presence of viral mutation(s) within the  areas targeted by this assay, and inadequate number of viral copies  (<250 copies / mL). A negative result must be combined with clinical  observations, patient history, and epidemiological information. If result is POSITIVE SARS-CoV-2 target nucleic acids are DETECTED. The SARS-CoV-2 RNA is generally detectable in upper and lower  respiratory specimens dur ing the acute phase of infection.  Positive  results are indicative of active infection with SARS-CoV-2.  Clinical  correlation with patient history and other diagnostic information is  necessary to determine patient infection status.  Positive results do  not rule out bacterial infection or co-infection with other viruses. If result is PRESUMPTIVE POSTIVE SARS-CoV-2 nucleic acids MAY BE PRESENT.   A presumptive positive result was obtained on the submitted specimen  and confirmed on repeat testing.  While 2019 novel coronavirus  (SARS-CoV-2) nucleic acids may be present in the submitted sample  additional confirmatory testing may be necessary for epidemiological  and / or clinical management purposes  to differentiate between  SARS-CoV-2 and other Sarbecovirus currently known to infect humans.  If clinically indicated additional testing with an alternate test  methodology [phone]) is advised. The SARS-CoV-2 RNA is generally  detectable in upper and lower respiratory sp ecimens during the acute  phase of infection. The expected result is Negative. Fact Sheet for Patients:  StrictlyIdeas.no Fact Sheet for Healthcare Providers: BankingDealers.co.za This test is not yet approved or cleared by the [place] FDA and has been authorized for detection and/or diagnosis of SARS-CoV-2 by FDA under an Emergency Use Authorization (EUA).  This EUA will remain in effect (meaning this test can be  used) for the duration of the COVID-19 declaration under Section 564(b)(1) of the Act, 21 U.S.C. section 360bbb-3(b)(1), unless the authorization is terminated or revoked sooner. Performed at [facility], [address], [place], [place] [address]   MRSA PCR Screening     Status: None   Collection Time: 09/26/18  5:20 PM   Specimen: Nasal Mucosa; Nasopharyngeal  Result Value Ref Range Status   MRSA by PCR NEGATIVE NEGATIVE Final    Comment:        The GeneXpert MRSA Assay (FDA approved for NASAL specimens only), is one component of a comprehensive MRSA colonization surveillance program. It is not intended to diagnose MRSA infection nor to guide or monitor treatment for MRSA infections. Performed at [facility], [address], [place], [place] [address]      Scheduled Meds:  amiodarone  200 mg Oral Daily   budesonide (PULMICORT) nebulizer solution  0.5 mg Nebulization BID   Chlorhexidine Gluconate Cloth  6 each Topical Q0600   docusate sodium  100 mg Oral BID   furosemide  80 mg Oral Daily   insulin aspart  0-20 Units Subcutaneous TID WC   insulin aspart  0-5 Units Subcutaneous QHS   insulin aspart  6 Units Subcutaneous TID WC   ipratropium  0.5 mg Nebulization TID   levalbuterol  1.25 mg Nebulization TID   mouth rinse  15 mL Mouth Rinse BID   methylPREDNISolone (SOLU-MEDROL) injection  40 mg Intravenous Q8H   metoprolol tartrate  50 mg Oral BID   potassium chloride SA  20 mEq Oral BID   rivaroxaban  20 mg Oral Q supper   Continuous Infusions:  Procedures/Studies: Dg Chest Portable 1 View  Result Date: 09/26/2018 CLINICAL DATA:  Shortness of breath. EXAM: PORTABLE CHEST 1 VIEW COMPARISON:  Radiographs of August 20, 2018. FINDINGS: Stable cardiomediastinal silhouette. No pneumothorax is noted. Mild bibasilar subsegmental atelectasis is noted. Bony thorax is unremarkable. IMPRESSION: Mild bibasilar subsegmental atelectasis. Electronically Signed   By: Marijo Conception M.D.   On: 09/26/2018 14:13   Ir Vertebroplasty Lumbar Bx Inc Uni/bil Inc Inject/imaging  Result Date: 09/06/2018 INDICATION: Severe low back pain secondary to compression fracture at L1. EXAM: VERTEBROPLASTY AT L1 MEDICATIONS: As antibiotic prophylaxis, Ancef 2 g IV was ordered pre-procedure and administered intravenously within 1 hour of incision. ANESTHESIA/SEDATION: Moderate (conscious) sedation was employed during this procedure. A total of Versed 1 mg and Fentanyl 25 mcg was administered intravenously. Moderate Sedation Time: 14 minutes. The patient's level of consciousness and vital signs were monitored continuously by radiology nursing throughout the procedure under my direct supervision. FLUOROSCOPY TIME:  Fluoroscopy Time: 4 minutes 30 seconds (629 mGy) COMPLICATIONS: None immediate. TECHNIQUE: Informed written consent was obtained from the patient after a thorough discussion of the procedural risks, benefits and alternatives. All questions were addressed. Maximal Sterile Barrier Technique was utilized including caps, mask, sterile gowns, sterile gloves, sterile drape, hand hygiene and  skin antiseptic. A timeout was performed prior to the initiation of the procedure. PROCEDURE: The patient was placed prone on the fluoroscopic table. Nasal oxygen was administered. Physiologic monitoring was performed throughout the duration of the procedure. The skin overlying the thoracolumbar region was prepped and draped in the usual sterile fashion. The L1 vertebral body was identified and the right pedicle was infiltrated with 0.25% Bupivacaine. This was then followed by the advancement of a 13-gauge Cook needle through the right pedicle into the anterior one-third at L1. A gentle contrast injection demonstrated a trabecular pattern of contrast. At this time, methylmethacrylate mixture was reconstituted. Under biplane intermittent fluoroscopy, the methylmethacrylate was then injected into the L1 vertebral  body with filling of the vertebral body. No extravasation was noted into the disk spaces or posteriorly into the spinal canal. No epidural venous contamination was seen. The needle was then removed. Hemostasis was achieved at the skin entry site. There were no acute complications. Patient tolerated the procedure well. The patient was observed for 3 hours and discharged in good condition. IMPRESSION: 1. Status post vertebral body augmentation for painful compression fracture at L1 using vertebroplasty technique. Electronically Signed   By: Luanne Bras M.D.   On: 09/05/2018 11:02    Orson Eva, DO  [facility] Pager [phone]  If 7PM-7AM, please contact night-coverage [email or web address] Password [email or web address] 09/27/2018, 10:54 AM   LOS: 1 day

## 2018-09-27 NOTE — TOC Initial Note (Signed)
Transition of Care [facility]) - Initial/Assessment Note    Patient Details  Name: Eileen [name] MRN: [id number] Date of Birth: 1945-11-24  Transition of Care [facility]) CM/SW Contact:    Ihor Gully, LCSW Phone Number: 09/27/2018, 6:19 PM  Clinical Narrative:                 Patient admitted for COPD with acute exacerbation. Patient [facility] discharged from [facility].  Patient reports being independent in the home. She states that she in not interested in rehab nor having [place] services again at this time.   Expected Discharge Plan: Home/Self Care Barriers to Discharge: No Barriers Identified   Patient Goals and CMS Choice Patient states their goals for this hospitalization and ongoing recovery are:: to return home to [facility]      Expected Discharge Plan and Services Expected Discharge Plan: Home/Self Care       Living arrangements for the past 2 months: Single Family Home                                      Prior Living Arrangements/Services Living arrangements for the past 2 months: Single Family Home Lives with:: Adult Children, Relatives Patient language and need for interpreter reviewed:: Yes Do you feel safe going back to the place where you live?: Yes      Need for Family Participation in Patient Care: Yes (Comment) Care giver support system in place?: Yes (comment) Current home services: DME Criminal Activity/Legal Involvement Pertinent to Current Situation/Hospitalization: No - Comment as needed  Activities of Daily Living Home Assistive Devices/Equipment: Walker (specify type) ADL Screening (condition at time of admission) Patient's cognitive ability adequate to safely complete daily activities?: Yes Is the patient deaf or have difficulty hearing?: No Does the patient have difficulty seeing, even when wearing glasses/contacts?: No Does the patient have difficulty concentrating, remembering, or making decisions?: No Patient able to express need for assistance  with ADLs?: Yes Does the patient have difficulty dressing or bathing?: Yes Independently performs ADLs?: No Communication: Independent Dressing (OT): Needs assistance Is this a change from baseline?: Change from baseline, expected to last <3days Grooming: Needs assistance Is this a change from baseline?: Pre-admission baseline Feeding: Independent Bathing: Needs assistance Is this a change from baseline?: Pre-admission baseline Toileting: Needs assistance Is this a change from baseline?: Pre-admission baseline In/Out Bed: Needs assistance Is this a change from baseline?: Pre-admission baseline Walks in Home: Needs assistance Is this a change from baseline?: Pre-admission baseline Does the patient have difficulty walking or climbing stairs?: Yes Weakness of Legs: Both Weakness of Arms/Hands: None  Permission Sought/Granted Permission sought to share information with : PCP                Emotional Assessment Appearance:: Appears stated age   Affect (typically observed): Accepting, Calm Orientation: : Oriented to Self, Oriented to  Time, Oriented to Situation, Oriented to Place Alcohol / Substance Use: Not Applicable Psych Involvement: No (comment)  Admission diagnosis:  COPD exacerbation ([place]) [J44.1] Patient Active Problem List   Diagnosis Date Noted  . Osteoporosis 08/30/2018  . Closed compression fracture of body of L1 vertebra ([place])   . Constipation   . Acute and chronic respiratory failure (acute-on-chronic) ([place]) 08/10/2018  . Left lower lobe pneumonia ([place]) 08/10/2018  . Leukocytosis 08/10/2018  . Chronic respiratory failure with hypoxia ([place]) 06/28/2018  . Acute upper GI bleeding 01/20/2018  .  Lower GI bleed 01/20/2018  . Acute respiratory failure with hypoxia ([place]) 09/17/2017  . Diabetes mellitus ([place]) 08/31/2017  . Acute hypoxemic respiratory failure ([place]) 08/31/2017  . Hemorrhoids 10/30/2016  . Atrial fibrillation with RVR ([place]) 09/24/2016  . Atrial  flutter ([place]) 09/23/2016  . Obstructive chronic bronchitis with exacerbation ([place]) 05/23/2016  . Vitamin D deficiency 05/23/2016  . Acute on chronic respiratory failure ([place]) 03/24/2015  . Metabolic encephalopathy 54/36/0677  . HCAP (healthcare-associated pneumonia) 03/24/2015  . Anticoagulation management encounter   . New onset atrial flutter ([place]) 03/21/2015  . Obesity 03/21/2015  . Atrial flutter with rapid ventricular response ([place]) 03/21/2015  . COPD (chronic obstructive pulmonary disease) ([place]) 12/20/2014  . COPD with acute exacerbation ([place]) 12/20/2014  . 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 Breast cancer ([place]) 10/11/2009   PCP:  Lavella Lemons, PA Pharmacy:   [facility] [address] [place] [address] Phone: [phone] Fax: [phone]     Social Determinants of Health ([place]) Interventions    Readmission Risk Interventions Readmission Risk Prevention Plan 08/22/2018 08/21/2018 08/19/2018  Transportation Screening Complete Complete -  PCP or Specialist Appt within 5-7 Days Complete - Complete  Home Care Screening Complete Complete -  Medication Review (RN CM) Complete - Complete  Some recent data might be hidden

## 2018-09-27 NOTE — Progress Notes (Signed)
Inpatient Diabetes Program Recommendations  AACE/ADA: New Consensus Statement on Inpatient Glycemic Control (2015)  Target Ranges:  Prepandial:   less than 140 mg/dL      Peak postprandial:   less than 180 mg/dL (1-2 hours)      Critically ill patients:  140 - 180 mg/dL   Lab Results  Component Value Date   GLUCAP 239 (H) 09/27/2018   HGBA1C 8.6 (H) 06/24/2018    Review of Glycemic Control Results for Eileen [name], Eileen [name] (MRN [id number]) as of 09/27/2018 09:58  Ref. Range 09/26/2018 18:14 09/26/2018 21:48 09/27/2018 07:58  Glucose-Capillary Latest Ref Range: 70 - 99 mg/dL 252 (H) 322 (H) 239 (H)   Diabetes history: DM 2 Outpatient Diabetes medications: Metformin XR 500 mg daily Current orders for Inpatient glycemic control:  Novolog resistant tid with meals and HS Novolog 6 units tid with meals Solumedrol 40 mg q 8 hours Inpatient Diabetes Program Recommendations:    Please consider adding Levemir 14 units daily.  Thanks,  Adah Perl, RN, BC-ADM Inpatient Diabetes Coordinator Pager [phone] (8a-5p)

## 2018-09-28 LAB — CBC
HCT: 37.1 % (ref 36.0–46.0)
Hemoglobin: 10.8 g/dL — ABNORMAL LOW (ref 12.0–15.0)
MCH: 28.6 pg (ref 26.0–34.0)
MCHC: 29.1 g/dL — ABNORMAL LOW (ref 30.0–36.0)
MCV: 98.4 fL (ref 80.0–100.0)
Platelets: 318 10*3/uL (ref 150–400)
RBC: 3.77 MIL/uL — ABNORMAL LOW (ref 3.87–5.11)
RDW: 14.1 % (ref 11.5–15.5)
WBC: 16.1 10*3/uL — ABNORMAL HIGH (ref 4.0–10.5)
nRBC: 0 % (ref 0.0–0.2)

## 2018-09-28 LAB — BASIC METABOLIC PANEL
Anion gap: 12 (ref 5–15)
BUN: 22 mg/dL (ref 8–23)
CO2: 38 mmol/L — ABNORMAL HIGH (ref 22–32)
Calcium: 9.5 mg/dL (ref 8.9–10.3)
Chloride: 92 mmol/L — ABNORMAL LOW (ref 98–111)
Creatinine, Ser: 0.64 mg/dL (ref 0.44–1.00)
GFR calc Af Amer: >60 mL/min (ref >60)
GFR calc non Af Amer: >60 mL/min (ref >60)
Glucose, Bld: 215 mg/dL — ABNORMAL HIGH (ref 70–99)
Potassium: 4.6 mmol/L (ref 3.5–5.1)
Sodium: 142 mmol/L (ref 135–145)

## 2018-09-28 LAB — GLUCOSE, CAPILLARY
Glucose-Capillary: 188 mg/dL — ABNORMAL HIGH (ref 70–99)
Glucose-Capillary: 342 mg/dL — ABNORMAL HIGH (ref 70–99)
Glucose-Capillary: 35 mg/dL — CL (ref 70–99)
Glucose-Capillary: 67 mg/dL — ABNORMAL LOW (ref 70–99)
Glucose-Capillary: 173 mg/dL — ABNORMAL HIGH (ref 70–99)
Glucose-Capillary: 295 mg/dL — ABNORMAL HIGH (ref 70–99)

## 2018-09-28 LAB — MAGNESIUM: Magnesium: 2.1 mg/dL (ref 1.7–2.4)

## 2018-09-28 MED ORDER — PREDNISONE 20 MG PO TABS
60.0000 mg | ORAL_TABLET | Freq: Every day | ORAL | Status: DC
  Administered 2018-09-29 – 2018-09-30 (×2): 60 mg via ORAL
  Filled 2018-09-28 (×2): qty 3

## 2018-09-28 MED ORDER — INSULIN ASPART 100 UNIT/ML ~~LOC~~ SOLN
8.0000 [IU] | Freq: Three times a day (TID) | SUBCUTANEOUS | Status: DC
  Administered 2018-09-29 (×2): 8 [IU] via SUBCUTANEOUS

## 2018-09-28 MED ORDER — INSULIN DETEMIR 100 UNIT/ML ~~LOC~~ SOLN
15.0000 [IU] | Freq: Every day | SUBCUTANEOUS | Status: DC
  Administered 2018-09-29: 15 [IU] via SUBCUTANEOUS
  Filled 2018-09-28 (×3): qty 0.15

## 2018-09-28 NOTE — Progress Notes (Signed)
PROGRESS NOTE  Eileen [name] UQJ:[id number] DOB: [birth year] DOA: 09/26/2018 PCP: Lavella Lemons, PA  Brief History:  73 year old female with a history of COPD, right breast cancer in remission, chronic respiratory failure on 3 L, atrial flutter on rivaroxaban, breast cancer, diabetes mellitus presenting with shortness of breath for the past 3 days.  The patient denied any fevers, chills, chest pain, nausea, vomiting, diarrhea, abdominal pain, headache, neck pain, hemoptysis.  She states that she was compliant with all her medications at home.  She has had 2 recent hospital admissions for COPD exacerbation.  Most recently, the patient was recently discharged from 08/19/2020 08/22/2018.  In addition, the patient had an admission from 08/10/2018 through 08/19/2018 pneumonia and COPD exacerbation. The patient followed up with her pulmonologist, Dr. Luan Pulling.  The patient was noted to be hypoxic with oxygen saturation of 70% on 3L.  As result, the patient was sent to the emergency department for further evaluation.  In the emergency department, the patient was afebrile hemodynamically stable.  BMP and CBC were essentially unremarkable with her hemoglobin at baseline.  Chest x-ray showed bibasilar atelectasis.  The patient was started on Solu-Medrol and bronchodilators.  Assessment/Plan: Acute on chronic respiratory failure with hypoxia -Secondary to COPD exacerbation -Wean oxygen back to baseline as tolerated for saturation greater 92%  COPD exacerbation -Continue Xopenex and Atrovent -Continue Solu-Medrol -Continue Pulmicort  Essential hypertension -Continue metoprolol tartrate  Atrial flutter -Continue amiodarone -Continue metoprolol tartrate -Continue rivaroxaban -currently in sinus  Diabetes mellitus type 2, uncontrolled with hyperglycemia -Recheck hemoglobin A1c -06/24/2018 hemoglobin A1c 8.6 -Resistant NovoLog sliding scale -increase novolog to 8 units with meas -holding  metformin -increase levemir 15 units -Anticipate elevated CBGs secondary to steroids  Chronic L1 compression fracture -PT evaluation      Disposition Plan:   Home 7/19 if stable Family Communication:   No Family at bedside  Consultants:  none  Code Status:  FULL  DVT Prophylaxis:  Xarelto   Procedures: As Listed in Progress Note Above  Antibiotics: None   Subjective: Pt is breathing better.  She feels she is 75% better.  She denies f/c, cp, n/v/d, abd pain, headache  Objective: Vitals:   09/28/18 0817 09/28/18 0819 09/28/18 1424 09/28/18 1507  BP:    119/69  Pulse:    72  Resp:    18  Temp:    (!) 97.5 F (36.4 C)  TempSrc:    Oral  SpO2: 100% 100% 99% 93%  Weight:      Height:        Intake/Output Summary (Last 24 hours) at 09/28/2018 1615 Last data filed at 09/27/2018 1800 Gross per 24 hour  Intake 120 ml  Output --  Net 120 ml   Weight change:  Exam:   General:  Pt is alert, follows commands appropriately, not in acute distress  HEENT: No icterus, No thrush, No neck mass, [place]/AT  Cardiovascular: RRR, S1/S2, no rubs, no gallops  Respiratory: scattered bilateral rales.  No wheeze. Diminished breath sounds  Abdomen: Soft/+BS, non tender, non distended, no guarding  Extremities: No edema, No lymphangitis, No petechiae, No rashes, no synovitis   Data Reviewed: I have personally reviewed following labs and imaging studies Basic Metabolic Panel: Recent Labs  Lab 09/26/18 1419 09/27/18 0609 09/28/18 0522  NA 144 144 142  K 4.0 4.0 4.6  CL 98 92* 92*  CO2 37* 40* 38*  GLUCOSE 238* 265* 215*  BUN  13 15 22   CREATININE 0.77 0.61 0.64  CALCIUM 9.0 9.0 9.5  MG  --  2.0 2.1   Liver Function Tests: Recent Labs  Lab 09/26/18 1419  AST 24  ALT 20  ALKPHOS 111  BILITOT 0.4  PROT 6.7  ALBUMIN 3.8   No results for input(s): LIPASE, AMYLASE in the last 168 hours. No results for input(s): AMMONIA in the last 168  hours. Coagulation Profile: No results for input(s): INR, PROTIME in the last 168 hours. CBC: Recent Labs  Lab 09/26/18 1419 09/27/18 0609 09/28/18 0522  WBC 9.9 12.0* 16.1*  NEUTROABS 8.2*  --   --   HGB 11.4* 11.6* 10.8*  HCT 39.7 39.7 37.1  MCV 99.5 97.8 98.4  PLT 312 332 318   Cardiac Enzymes: No results for input(s): CKTOTAL, CKMB, CKMBINDEX, TROPONINI in the last 168 hours. BNP: Invalid input(s): POCBNP CBG: Recent Labs  Lab 09/27/18 1634 09/27/18 2155 09/28/18 0724 09/28/18 1111 09/28/18 1605  GLUCAP 100* 197* 188* 342* 35*   HbA1C: No results for input(s): HGBA1C in the last 72 hours. Urine analysis:    Component Value Date/Time   COLORURINE YELLOW 09/05/2018 0850   APPEARANCEUR CLEAR 09/05/2018 0850   LABSPEC 1.032 (H) 09/05/2018 0850   PHURINE 5.0 09/05/2018 0850   GLUCOSEU >=500 (A) 09/05/2018 0850   HGBUR NEGATIVE 09/05/2018 0850   BILIRUBINUR NEGATIVE 09/05/2018 0850   KETONESUR 5 (A) 09/05/2018 0850   PROTEINUR 100 (A) 09/05/2018 0850   UROBILINOGEN 0.2 09/09/2009 1010   NITRITE NEGATIVE 09/05/2018 0850   LEUKOCYTESUR NEGATIVE 09/05/2018 0850   Sepsis Labs: @LABRCNTIP (procalcitonin:4,lacticidven:4) ) Recent Results (from the past 240 hour(s))  SARS Coronavirus 2 (CEPHEID- Performed in [place] hospital lab), Hosp Order     Status: None   Collection Time: 09/26/18  2:18 PM   Specimen: Nasopharyngeal Swab  Result Value Ref Range Status   SARS Coronavirus 2 NEGATIVE NEGATIVE Final    Comment: (NOTE) If result is NEGATIVE SARS-CoV-2 target nucleic acids are NOT DETECTED. The SARS-CoV-2 RNA is generally detectable in upper and lower  respiratory specimens during the acute phase of infection. The lowest  concentration of SARS-CoV-2 viral copies this assay can detect is 250  copies / mL. A negative result does not preclude SARS-CoV-2 infection  and should not be used as the sole basis for treatment or other  patient management decisions.  A  negative result may occur with  improper specimen collection / handling, submission of specimen other  than nasopharyngeal swab, presence of viral mutation(s) within the  areas targeted by this assay, and inadequate number of viral copies  (<250 copies / mL). A negative result must be combined with clinical  observations, patient history, and epidemiological information. If result is POSITIVE SARS-CoV-2 target nucleic acids are DETECTED. The SARS-CoV-2 RNA is generally detectable in upper and lower  respiratory specimens dur ing the acute phase of infection.  Positive  results are indicative of active infection with SARS-CoV-2.  Clinical  correlation with patient history and other diagnostic information is  necessary to determine patient infection status.  Positive results do  not rule out bacterial infection or co-infection with other viruses. If result is PRESUMPTIVE POSTIVE SARS-CoV-2 nucleic acids MAY BE PRESENT.   A presumptive positive result was obtained on the submitted specimen  and confirmed on repeat testing.  While 2019 novel coronavirus  (SARS-CoV-2) nucleic acids may be present in the submitted sample  additional confirmatory testing may be necessary for epidemiological  and /  or clinical management purposes  to differentiate between  SARS-CoV-2 and other Sarbecovirus currently known to infect humans.  If clinically indicated additional testing with an alternate test  methodology [phone]) is advised. The SARS-CoV-2 RNA is generally  detectable in upper and lower respiratory sp ecimens during the acute  phase of infection. The expected result is Negative. Fact Sheet for Patients:  StrictlyIdeas.no Fact Sheet for Healthcare Providers: BankingDealers.co.za This test is not yet approved or cleared by the [place] FDA and has been authorized for detection and/or diagnosis of SARS-CoV-2 by FDA under an Emergency Use  Authorization (EUA).  This EUA will remain in effect (meaning this test can be used) for the duration of the COVID-19 declaration under Section 564(b)(1) of the Act, 21 U.S.C. section 360bbb-3(b)(1), unless the authorization is terminated or revoked sooner. Performed at [facility], [address] [place], [place] [address]   MRSA PCR Screening     Status: None   Collection Time: 09/26/18  5:20 PM   Specimen: Nasal Mucosa; Nasopharyngeal  Result Value Ref Range Status   MRSA by PCR NEGATIVE NEGATIVE Final    Comment:        The GeneXpert MRSA Assay (FDA approved for NASAL specimens only), is one component of a comprehensive MRSA colonization surveillance program. It is not intended to diagnose MRSA infection nor to guide or monitor treatment for MRSA infections. Performed at [facility], [address] [place], [place] [address]      Scheduled Meds:  amiodarone  200 mg Oral Daily   budesonide (PULMICORT) nebulizer solution  0.5 mg Nebulization BID   Chlorhexidine Gluconate Cloth  6 each Topical Q0600   docusate sodium  100 mg Oral BID   furosemide  80 mg Oral Daily   insulin aspart  0-20 Units Subcutaneous TID WC   insulin aspart  0-5 Units Subcutaneous QHS   insulin aspart  8 Units Subcutaneous TID WC   [START ON 09/29/2018] insulin detemir  15 Units Subcutaneous Daily   ipratropium  0.5 mg Nebulization TID   levalbuterol  1.25 mg Nebulization TID   mouth rinse  15 mL Mouth Rinse BID   methylPREDNISolone (SOLU-MEDROL) injection  40 mg Intravenous Q8H   metoprolol tartrate  50 mg Oral BID   rivaroxaban  20 mg Oral Q supper   Continuous Infusions:  Procedures/Studies: Ct Angio Chest Pe W Or Wo Contrast  Result Date: 09/27/2018 CLINICAL DATA:  Increased shortness of breath over the last few weeks. History of COPD. Right breast cancer in remission. EXAM: CT ANGIOGRAPHY CHEST WITH CONTRAST TECHNIQUE: Multidetector CT imaging of the chest was performed using  the standard protocol during bolus administration of intravenous contrast. Multiplanar CT image reconstructions and MIPs were obtained to evaluate the vascular anatomy. CONTRAST:  79mL OMNIPAQUE IOHEXOL 350 MG/ML SOLN COMPARISON:  Chest radiograph, 09/26/2018. FINDINGS: Cardiovascular: There is satisfactory opacification of the pulmonary arteries to the segmental level. There is no evidence of a pulmonary embolism. Main pulmonary artery is dilated to 3.7 cm. Heart is borderline enlarged. No pericardial effusion. Minor left and right coronary artery calcifications. Aorta is normal in caliber. No dissection. Partly calcified atherosclerotic plaque at the origin of the left subclavian artery. No significant stenosis. Mediastinum/Nodes: No enlarged mediastinal, hilar, or axillary lymph nodes. Thyroid gland, trachea, and esophagus demonstrate no significant findings. Lungs/Pleura: Advanced centrilobular emphysema. Linear and discoid type opacities at the lung bases consistent with atelectasis. No evidence of pneumonia or pulmonary edema. No lung mass or suspicious nodule.  No pleural effusion or pneumothorax. Upper Abdomen: No acute findings. Nonobstructing intrarenal stones. Renal cortical thinning bilaterally low-density renal masses consistent with cysts. Musculoskeletal: Chronic compression fracture of L1 treated with vertebroplasty. No other fractures. No osteoblastic or osteolytic lesions. Previous right mastectomy.  No chest wall masses. Review of the MIP images confirms the above findings. IMPRESSION: 1. No evidence of a pulmonary embolism. 2. No acute findings. 3. Advanced COPD/emphysema.  Mild lung base atelectasis. 4. Enlargement of the main pulmonary artery suggesting pulmonary hypertension. Emphysema (ICD10-J43.9). Electronically Signed   By: Lajean Manes M.D.   On: 09/27/2018 15:36   Dg Chest Portable 1 View  Result Date: 09/26/2018 CLINICAL DATA:  Shortness of breath. EXAM: PORTABLE CHEST 1 VIEW  COMPARISON:  Radiographs of August 20, 2018. FINDINGS: Stable cardiomediastinal silhouette. No pneumothorax is noted. Mild bibasilar subsegmental atelectasis is noted. Bony thorax is unremarkable. IMPRESSION: Mild bibasilar subsegmental atelectasis. Electronically Signed   By: Marijo Conception M.D.   On: 09/26/2018 14:13   Ir Vertebroplasty Lumbar Bx Inc Uni/bil Inc Inject/imaging  Result Date: 09/06/2018 INDICATION: Severe low back pain secondary to compression fracture at L1. EXAM: VERTEBROPLASTY AT L1 MEDICATIONS: As antibiotic prophylaxis, Ancef 2 g IV was ordered pre-procedure and administered intravenously within 1 hour of incision. ANESTHESIA/SEDATION: Moderate (conscious) sedation was employed during this procedure. A total of Versed 1 mg and Fentanyl 25 mcg was administered intravenously. Moderate Sedation Time: 14 minutes. The patient's level of consciousness and vital signs were monitored continuously by radiology nursing throughout the procedure under my direct supervision. FLUOROSCOPY TIME:  Fluoroscopy Time: 4 minutes 30 seconds (858 mGy) COMPLICATIONS: None immediate. TECHNIQUE: Informed written consent was obtained from the patient after a thorough discussion of the procedural risks, benefits and alternatives. All questions were addressed. Maximal Sterile Barrier Technique was utilized including caps, mask, sterile gowns, sterile gloves, sterile drape, hand hygiene and skin antiseptic. A timeout was performed prior to the initiation of the procedure. PROCEDURE: The patient was placed prone on the fluoroscopic table. Nasal oxygen was administered. Physiologic monitoring was performed throughout the duration of the procedure. The skin overlying the thoracolumbar region was prepped and draped in the usual sterile fashion. The L1 vertebral body was identified and the right pedicle was infiltrated with 0.25% Bupivacaine. This was then followed by the advancement of a 13-gauge Cook needle through the right  pedicle into the anterior one-third at L1. A gentle contrast injection demonstrated a trabecular pattern of contrast. At this time, methylmethacrylate mixture was reconstituted. Under biplane intermittent fluoroscopy, the methylmethacrylate was then injected into the L1 vertebral body with filling of the vertebral body. No extravasation was noted into the disk spaces or posteriorly into the spinal canal. No epidural venous contamination was seen. The needle was then removed. Hemostasis was achieved at the skin entry site. There were no acute complications. Patient tolerated the procedure well. The patient was observed for 3 hours and discharged in good condition. IMPRESSION: 1. Status post vertebral body augmentation for painful compression fracture at L1 using vertebroplasty technique. Electronically Signed   By: Luanne Bras M.D.   On: 09/05/2018 11:02    Orson Eva, DO  [facility] Pager [phone]  If 7PM-7AM, please contact night-coverage [email or web address] Password [email or web address] 09/28/2018, 4:15 PM   LOS: 2 days

## 2018-09-29 DIAGNOSIS — S32010A Wedge compression fracture of first lumbar vertebra, initial encounter for closed fracture: Secondary | ICD-10-CM

## 2018-09-29 LAB — HEMOGLOBIN A1C
Hgb A1c MFr Bld: 8.5 % — ABNORMAL HIGH (ref 4.8–5.6)
Mean Plasma Glucose: 197.25 mg/dL

## 2018-09-29 LAB — MAGNESIUM: Magnesium: 2.1 mg/dL (ref 1.7–2.4)

## 2018-09-29 LAB — BASIC METABOLIC PANEL
Anion gap: 11 (ref 5–15)
BUN: 22 mg/dL (ref 8–23)
CO2: 38 mmol/L — ABNORMAL HIGH (ref 22–32)
Calcium: 9.5 mg/dL (ref 8.9–10.3)
Chloride: 90 mmol/L — ABNORMAL LOW (ref 98–111)
Creatinine, Ser: 0.54 mg/dL (ref 0.44–1.00)
GFR calc Af Amer: >60 mL/min (ref >60)
GFR calc non Af Amer: >60 mL/min (ref >60)
Glucose, Bld: 224 mg/dL — ABNORMAL HIGH (ref 70–99)
Potassium: 4.5 mmol/L (ref 3.5–5.1)
Sodium: 139 mmol/L (ref 135–145)

## 2018-09-29 LAB — CBC
HCT: 39.3 % (ref 36.0–46.0)
Hemoglobin: 11.4 g/dL — ABNORMAL LOW (ref 12.0–15.0)
MCH: 28.4 pg (ref 26.0–34.0)
MCHC: 29 g/dL — ABNORMAL LOW (ref 30.0–36.0)
MCV: 97.8 fL (ref 80.0–100.0)
Platelets: 325 10*3/uL (ref 150–400)
RBC: 4.02 MIL/uL (ref 3.87–5.11)
RDW: 14.1 % (ref 11.5–15.5)
WBC: 13.7 10*3/uL — ABNORMAL HIGH (ref 4.0–10.5)
nRBC: 0 % (ref 0.0–0.2)

## 2018-09-29 LAB — GLUCOSE, CAPILLARY
Glucose-Capillary: 213 mg/dL — ABNORMAL HIGH (ref 70–99)
Glucose-Capillary: 316 mg/dL — ABNORMAL HIGH (ref 70–99)
Glucose-Capillary: 61 mg/dL — ABNORMAL LOW (ref 70–99)
Glucose-Capillary: 149 mg/dL — ABNORMAL HIGH (ref 70–99)
Glucose-Capillary: 237 mg/dL — ABNORMAL HIGH (ref 70–99)

## 2018-09-29 NOTE — Discharge Summary (Signed)
Physician Discharge Summary  [name] Mounger VQQ:[id number] DOB: [birth year] DOA: 09/26/2018  PCP: Lavella Lemons, PA  Admit date: 09/26/2018 Discharge date: 09/30/2018  Admitted From: Home Disposition:  Home   Recommendations for Outpatient Follow-up:  1. Follow up with PCP in 1-2 weeks 2. Please obtain BMP/CBC in one week    Discharge Condition: Stable CODE STATUS: FULL Diet recommendation: Heart Healthy / Carb Modified   Brief/Interim Summary: 73 year old female with a history of COPD,right breast cancer in remission,chronic respiratory failure on 3 L, atrial flutter on rivaroxaban, breast cancer, diabetes mellitus presenting with shortness of breath for the past 3 days. The patient denied any fevers, chills, chest pain, nausea, vomiting, diarrhea, abdominal pain, headache, neck pain, hemoptysis. She states that she was compliant with all her medications at home. She has had 2 recent hospital admissions for COPD exacerbation. Most recently, the patient was recently discharged from 08/19/2020 08/22/2018. In addition, the patient had an admission from 08/10/2018 through 6/8/2020pneumonia and COPD exacerbation. The patient followed up with her pulmonologist, Dr. Luan Pulling. The patient was noted to be hypoxic with oxygen saturation of 70% on 3L.As result, the patient was sent to the emergency department for further evaluation. In the emergency department, the patient was afebrile hemodynamically stable. BMP and CBC were essentially unremarkable with her hemoglobin at baseline. Chest x-ray showed bibasilar atelectasis. The patient was started on Solu-Medrol and bronchodilators with slow clinical improvement.  Discharge Diagnoses:  Acute on chronic respiratory failure with hypoxia -Secondary to COPD exacerbation -Wean oxygen back to baseline as tolerated for saturation greater 92% -back to baseline 3L at time of d/c  COPD exacerbation -Continue Xopenex and Atrovent -Continue  Solu-Medrol>>>d/c home with prednisone taper -Continue Pulmicort  Essential hypertension -Continue metoprololtartrate  Atrial flutter -Continue amiodarone -Continue metoprolol tartrate -Continue rivaroxaban -currently in sinus  Diabetes mellitus type 2, uncontrolled with hyperglycemia -09/29/18 hemoglobin A1c--8.5 -06/24/2018 hemoglobin A1c 8.6 -Resistant NovoLog sliding scale -increase novolog to 8 units with meals -holding metformin--restart after discharge -discussed adding glipizide after d/c; however, daughter did not want me to add more meds and prefers to follow with PCP for any additional changes -increaselevemir 15units during the hospitalization -Anticipate elevated CBGs secondary to steroids  Chronic L1 compression fracture -stable -pain controlled    Discharge Instructions   Allergies as of 09/30/2018      Reactions   Codeine Other (See Comments)   "jittery"      Medication List    STOP taking these medications   terconazole 0.8 % vaginal cream Commonly known as: TERAZOL 3     TAKE these medications   acetaminophen 500 MG tablet Commonly known as: TYLENOL Take 500 mg by mouth every 8 (eight) hours as needed for mild pain or headache.   amiodarone 200 MG tablet Commonly known as: PACERONE Take 1 tablet (200 mg total) by mouth daily.   docusate sodium 100 MG capsule Commonly known as: Colace Take 1 capsule (100 mg total) by mouth 2 (two) times daily. What changed: when to take this   furosemide 40 MG tablet Commonly known as: LASIX Take 80 mg by mouth daily.   guaiFENesin 600 MG 12 hr tablet Commonly known as: MUCINEX Take 1 tablet (600 mg total) by mouth 2 (two) times daily.   metFORMIN 500 MG 24 hr tablet Commonly known as: GLUCOPHAGE-XR Take 500 mg by mouth daily with breakfast.   metoprolol tartrate 50 MG tablet Commonly known as: LOPRESSOR Take 50 mg by mouth 2 (two) times daily.  OXYGEN Inhale 2.5-3 L into the lungs  continuous.   polyethylene glycol 17 g packet Commonly known as: MIRALAX / GLYCOLAX Take 17 g by mouth daily as needed for mild constipation.   potassium chloride SA 20 MEQ tablet Commonly known as: K-DUR Take 20 mEq by mouth 2 (two) times daily.   predniSONE 10 MG tablet Commonly known as: DELTASONE Take 10 mg by mouth daily with breakfast. What changed: Another medication with the same name was added. Make sure you understand how and when to take each.   predniSONE 10 MG tablet Commonly known as: DELTASONE Take 6 tablets (60 mg total) by mouth daily with breakfast. And decrease by one tablet daily Start taking on: October 01, 2018 What changed: You were already taking a medication with the same name, and this prescription was added. Make sure you understand how and when to take each.   albuterol (2.5 MG/3ML) 0.083% nebulizer solution Commonly known as: PROVENTIL Take 2.5 mg by nebulization every 6 (six) hours as needed for wheezing or shortness of breath.   ProAir HFA 108 (90 Base) MCG/ACT inhaler Generic drug: albuterol Inhale 2 puffs into the lungs every 6 (six) hours as needed for wheezing or shortness of breath.   rivaroxaban 20 MG Tabs tablet Commonly known as: Xarelto Take 1 tablet (20 mg total) by mouth daily with supper. Stop Xarelto until Monday (08/26/18) in anticipation for kyphoplasty   Trelegy Ellipta 100-62.5-25 MCG/INH Aepb Generic drug: Fluticasone-Umeclidin-Vilant Take 1 puff by mouth daily.       Allergies  Allergen Reactions   Codeine Other (See Comments)    "jittery"    Consultations:  none   Procedures/Studies: Ct Angio Chest Pe W Or Wo Contrast  Result Date: 09/27/2018 CLINICAL DATA:  Increased shortness of breath over the last few weeks. History of COPD. Right breast cancer in remission. EXAM: CT ANGIOGRAPHY CHEST WITH CONTRAST TECHNIQUE: Multidetector CT imaging of the chest was performed using the standard protocol during bolus  administration of intravenous contrast. Multiplanar CT image reconstructions and MIPs were obtained to evaluate the vascular anatomy. CONTRAST:  22mL OMNIPAQUE IOHEXOL 350 MG/ML SOLN COMPARISON:  Chest radiograph, 09/26/2018. FINDINGS: Cardiovascular: There is satisfactory opacification of the pulmonary arteries to the segmental level. There is no evidence of a pulmonary embolism. Main pulmonary artery is dilated to 3.7 cm. Heart is borderline enlarged. No pericardial effusion. Minor left and right coronary artery calcifications. Aorta is normal in caliber. No dissection. Partly calcified atherosclerotic plaque at the origin of the left subclavian artery. No significant stenosis. Mediastinum/Nodes: No enlarged mediastinal, hilar, or axillary lymph nodes. Thyroid gland, trachea, and esophagus demonstrate no significant findings. Lungs/Pleura: Advanced centrilobular emphysema. Linear and discoid type opacities at the lung bases consistent with atelectasis. No evidence of pneumonia or pulmonary edema. No lung mass or suspicious nodule. No pleural effusion or pneumothorax. Upper Abdomen: No acute findings. Nonobstructing intrarenal stones. Renal cortical thinning bilaterally low-density renal masses consistent with cysts. Musculoskeletal: Chronic compression fracture of L1 treated with vertebroplasty. No other fractures. No osteoblastic or osteolytic lesions. Previous right mastectomy.  No chest wall masses. Review of the MIP images confirms the above findings. IMPRESSION: 1. No evidence of a pulmonary embolism. 2. No acute findings. 3. Advanced COPD/emphysema.  Mild lung base atelectasis. 4. Enlargement of the main pulmonary artery suggesting pulmonary hypertension. Emphysema (ICD10-J43.9). Electronically Signed   By: Lajean Manes M.D.   On: 09/27/2018 15:36   Dg Chest Portable 1 View  Result Date: 09/26/2018 CLINICAL DATA:  Shortness of breath. EXAM: PORTABLE CHEST 1 VIEW COMPARISON:  Radiographs of August 20, 2018.  FINDINGS: Stable cardiomediastinal silhouette. No pneumothorax is noted. Mild bibasilar subsegmental atelectasis is noted. Bony thorax is unremarkable. IMPRESSION: Mild bibasilar subsegmental atelectasis. Electronically Signed   By: Marijo Conception M.D.   On: 09/26/2018 14:13   Ir Vertebroplasty Lumbar Bx Inc Uni/bil Inc Inject/imaging  Result Date: 09/06/2018 INDICATION: Severe low back pain secondary to compression fracture at L1. EXAM: VERTEBROPLASTY AT L1 MEDICATIONS: As antibiotic prophylaxis, Ancef 2 g IV was ordered pre-procedure and administered intravenously within 1 hour of incision. ANESTHESIA/SEDATION: Moderate (conscious) sedation was employed during this procedure. A total of Versed 1 mg and Fentanyl 25 mcg was administered intravenously. Moderate Sedation Time: 14 minutes. The patient's level of consciousness and vital signs were monitored continuously by radiology nursing throughout the procedure under my direct supervision. FLUOROSCOPY TIME:  Fluoroscopy Time: 4 minutes 30 seconds (767 mGy) COMPLICATIONS: None immediate. TECHNIQUE: Informed written consent was obtained from the patient after a thorough discussion of the procedural risks, benefits and alternatives. All questions were addressed. Maximal Sterile Barrier Technique was utilized including caps, mask, sterile gowns, sterile gloves, sterile drape, hand hygiene and skin antiseptic. A timeout was performed prior to the initiation of the procedure. PROCEDURE: The patient was placed prone on the fluoroscopic table. Nasal oxygen was administered. Physiologic monitoring was performed throughout the duration of the procedure. The skin overlying the thoracolumbar region was prepped and draped in the usual sterile fashion. The L1 vertebral body was identified and the right pedicle was infiltrated with 0.25% Bupivacaine. This was then followed by the advancement of a 13-gauge Cook needle through the right pedicle into the anterior one-third at  L1. A gentle contrast injection demonstrated a trabecular pattern of contrast. At this time, methylmethacrylate mixture was reconstituted. Under biplane intermittent fluoroscopy, the methylmethacrylate was then injected into the L1 vertebral body with filling of the vertebral body. No extravasation was noted into the disk spaces or posteriorly into the spinal canal. No epidural venous contamination was seen. The needle was then removed. Hemostasis was achieved at the skin entry site. There were no acute complications. Patient tolerated the procedure well. The patient was observed for 3 hours and discharged in good condition. IMPRESSION: 1. Status post vertebral body augmentation for painful compression fracture at L1 using vertebroplasty technique. Electronically Signed   By: Luanne Bras M.D.   On: 09/05/2018 11:02        Discharge Exam: Vitals:   09/30/18 0825 09/30/18 0826  BP:    Pulse:    Resp:    Temp:    SpO2: 99% 99%   Vitals:   09/30/18 0500 09/30/18 0548 09/30/18 0825 09/30/18 0826  BP:  129/72    Pulse:  68    Resp:  18    Temp:  97.9 F (36.6 C)    TempSrc:  Oral    SpO2:  100% 99% 99%  Weight: 74.8 kg     Height:        General: Pt is alert, awake, not in acute distress Cardiovascular: RRR, S1/S2 +, no rubs, no gallops Respiratory: bibasilar rales. No wheeze Abdominal: Soft, NT, ND, bowel sounds + Extremities: no edema, no cyanosis   The results of significant diagnostics from this hospitalization (including imaging, microbiology, ancillary and laboratory) are listed below for reference.    Significant Diagnostic Studies: Ct Angio Chest Pe W Or Wo Contrast  Result Date: 09/27/2018 CLINICAL DATA:  Increased shortness  of breath over the last few weeks. History of COPD. Right breast cancer in remission. EXAM: CT ANGIOGRAPHY CHEST WITH CONTRAST TECHNIQUE: Multidetector CT imaging of the chest was performed using the standard protocol during bolus administration  of intravenous contrast. Multiplanar CT image reconstructions and MIPs were obtained to evaluate the vascular anatomy. CONTRAST:  3mL OMNIPAQUE IOHEXOL 350 MG/ML SOLN COMPARISON:  Chest radiograph, 09/26/2018. FINDINGS: Cardiovascular: There is satisfactory opacification of the pulmonary arteries to the segmental level. There is no evidence of a pulmonary embolism. Main pulmonary artery is dilated to 3.7 cm. Heart is borderline enlarged. No pericardial effusion. Minor left and right coronary artery calcifications. Aorta is normal in caliber. No dissection. Partly calcified atherosclerotic plaque at the origin of the left subclavian artery. No significant stenosis. Mediastinum/Nodes: No enlarged mediastinal, hilar, or axillary lymph nodes. Thyroid gland, trachea, and esophagus demonstrate no significant findings. Lungs/Pleura: Advanced centrilobular emphysema. Linear and discoid type opacities at the lung bases consistent with atelectasis. No evidence of pneumonia or pulmonary edema. No lung mass or suspicious nodule. No pleural effusion or pneumothorax. Upper Abdomen: No acute findings. Nonobstructing intrarenal stones. Renal cortical thinning bilaterally low-density renal masses consistent with cysts. Musculoskeletal: Chronic compression fracture of L1 treated with vertebroplasty. No other fractures. No osteoblastic or osteolytic lesions. Previous right mastectomy.  No chest wall masses. Review of the MIP images confirms the above findings. IMPRESSION: 1. No evidence of a pulmonary embolism. 2. No acute findings. 3. Advanced COPD/emphysema.  Mild lung base atelectasis. 4. Enlargement of the main pulmonary artery suggesting pulmonary hypertension. Emphysema (ICD10-J43.9). Electronically Signed   By: Lajean Manes M.D.   On: 09/27/2018 15:36   Dg Chest Portable 1 View  Result Date: 09/26/2018 CLINICAL DATA:  Shortness of breath. EXAM: PORTABLE CHEST 1 VIEW COMPARISON:  Radiographs of August 20, 2018. FINDINGS:  Stable cardiomediastinal silhouette. No pneumothorax is noted. Mild bibasilar subsegmental atelectasis is noted. Bony thorax is unremarkable. IMPRESSION: Mild bibasilar subsegmental atelectasis. Electronically Signed   By: Marijo Conception M.D.   On: 09/26/2018 14:13   Ir Vertebroplasty Lumbar Bx Inc Uni/bil Inc Inject/imaging  Result Date: 09/06/2018 INDICATION: Severe low back pain secondary to compression fracture at L1. EXAM: VERTEBROPLASTY AT L1 MEDICATIONS: As antibiotic prophylaxis, Ancef 2 g IV was ordered pre-procedure and administered intravenously within 1 hour of incision. ANESTHESIA/SEDATION: Moderate (conscious) sedation was employed during this procedure. A total of Versed 1 mg and Fentanyl 25 mcg was administered intravenously. Moderate Sedation Time: 14 minutes. The patient's level of consciousness and vital signs were monitored continuously by radiology nursing throughout the procedure under my direct supervision. FLUOROSCOPY TIME:  Fluoroscopy Time: 4 minutes 30 seconds (474 mGy) COMPLICATIONS: None immediate. TECHNIQUE: Informed written consent was obtained from the patient after a thorough discussion of the procedural risks, benefits and alternatives. All questions were addressed. Maximal Sterile Barrier Technique was utilized including caps, mask, sterile gowns, sterile gloves, sterile drape, hand hygiene and skin antiseptic. A timeout was performed prior to the initiation of the procedure. PROCEDURE: The patient was placed prone on the fluoroscopic table. Nasal oxygen was administered. Physiologic monitoring was performed throughout the duration of the procedure. The skin overlying the thoracolumbar region was prepped and draped in the usual sterile fashion. The L1 vertebral body was identified and the right pedicle was infiltrated with 0.25% Bupivacaine. This was then followed by the advancement of a 13-gauge Cook needle through the right pedicle into the anterior one-third at L1. A gentle  contrast injection demonstrated a trabecular  pattern of contrast. At this time, methylmethacrylate mixture was reconstituted. Under biplane intermittent fluoroscopy, the methylmethacrylate was then injected into the L1 vertebral body with filling of the vertebral body. No extravasation was noted into the disk spaces or posteriorly into the spinal canal. No epidural venous contamination was seen. The needle was then removed. Hemostasis was achieved at the skin entry site. There were no acute complications. Patient tolerated the procedure well. The patient was observed for 3 hours and discharged in good condition. IMPRESSION: 1. Status post vertebral body augmentation for painful compression fracture at L1 using vertebroplasty technique. Electronically Signed   By: Luanne Bras M.D.   On: 09/05/2018 11:02     Microbiology: Recent Results (from the past 240 hour(s))  SARS Coronavirus 2 (CEPHEID- Performed in [facility] hospital lab), Hosp Order     Status: None   Collection Time: 09/26/18  2:18 PM   Specimen: Nasopharyngeal Swab  Result Value Ref Range Status   SARS Coronavirus 2 NEGATIVE NEGATIVE Final    Comment: (NOTE) If result is NEGATIVE SARS-CoV-2 target nucleic acids are NOT DETECTED. The SARS-CoV-2 RNA is generally detectable in upper and lower  respiratory specimens during the acute phase of infection. The lowest  concentration of SARS-CoV-2 viral copies this assay can detect is 250  copies / mL. A negative result does not preclude SARS-CoV-2 infection  and should not be used as the sole basis for treatment or other  patient management decisions.  A negative result may occur with  improper specimen collection / handling, submission of specimen other  than nasopharyngeal swab, presence of viral mutation(s) within the  areas targeted by this assay, and inadequate number of viral copies  (<250 copies / mL). A negative result must be combined with clinical  observations, patient  history, and epidemiological information. If result is POSITIVE SARS-CoV-2 target nucleic acids are DETECTED. The SARS-CoV-2 RNA is generally detectable in upper and lower  respiratory specimens dur ing the acute phase of infection.  Positive  results are indicative of active infection with SARS-CoV-2.  Clinical  correlation with patient history and other diagnostic information is  necessary to determine patient infection status.  Positive results do  not rule out bacterial infection or co-infection with other viruses. If result is PRESUMPTIVE POSTIVE SARS-CoV-2 nucleic acids MAY BE PRESENT.   A presumptive positive result was obtained on the submitted specimen  and confirmed on repeat testing.  While 2019 novel coronavirus  (SARS-CoV-2) nucleic acids may be present in the submitted sample  additional confirmatory testing may be necessary for epidemiological  and / or clinical management purposes  to differentiate between  SARS-CoV-2 and other Sarbecovirus currently known to infect humans.  If clinically indicated additional testing with an alternate test  methodology [id number]) is advised. The SARS-CoV-2 RNA is generally  detectable in upper and lower respiratory sp ecimens during the acute  phase of infection. The expected result is Negative. Fact Sheet for Patients:  StrictlyIdeas.no Fact Sheet for Healthcare Providers: BankingDealers.co.za This test is not yet approved or cleared by the [place] FDA and has been authorized for detection and/or diagnosis of SARS-CoV-2 by FDA under an Emergency Use Authorization (EUA).  This EUA will remain in effect (meaning this test can be used) for the duration of the COVID-19 declaration under Section 564(b)(1) of the Act, 21 U.S.C. section 360bbb-3(b)(1), unless the authorization is terminated or revoked sooner. Performed at [facility], [address], [place], [place] [address]   MRSA PCR  Screening     Status: None   Collection Time: 09/26/18  5:20 PM   Specimen: Nasal Mucosa; Nasopharyngeal  Result Value Ref Range Status   MRSA by PCR NEGATIVE NEGATIVE Final    Comment:        The GeneXpert MRSA Assay (FDA approved for NASAL specimens only), is one component of a comprehensive MRSA colonization surveillance program. It is not intended to diagnose MRSA infection nor to guide or monitor treatment for MRSA infections. Performed at [facility], [address] [place], [place] [address]      Labs: Basic Metabolic Panel: Recent Labs  Lab 09/26/18 1419 09/27/18 0609 09/28/18 0522 09/29/18 0602 09/30/18 0528  NA 144 144 142 139 144  K 4.0 4.0 4.6 4.5 4.3  CL 98 92* 92* 90* 91*  CO2 37* 40* 38* 38* 42*  GLUCOSE 238* 265* 215* 224* 95  BUN 13 15 22 22 21   CREATININE 0.77 0.61 0.64 0.54 0.66  CALCIUM 9.0 9.0 9.5 9.5 9.6  MG  --  2.0 2.1 2.1 2.2   Liver Function Tests: Recent Labs  Lab 09/26/18 1419  AST 24  ALT 20  ALKPHOS 111  BILITOT 0.4  PROT 6.7  ALBUMIN 3.8   No results for input(s): LIPASE, AMYLASE in the last 168 hours. No results for input(s): AMMONIA in the last 168 hours. CBC: Recent Labs  Lab 09/26/18 1419 09/27/18 0609 09/28/18 0522 09/29/18 0602 09/30/18 0528  WBC 9.9 12.0* 16.1* 13.7* 11.5*  NEUTROABS 8.2*  --   --   --   --   HGB 11.4* 11.6* 10.8* 11.4* 12.2  HCT 39.7 39.7 37.1 39.3 43.1  MCV 99.5 97.8 98.4 97.8 99.1  PLT 312 332 318 325 332   Cardiac Enzymes: No results for input(s): CKTOTAL, CKMB, CKMBINDEX, TROPONINI in the last 168 hours. BNP: Invalid input(s): POCBNP CBG: Recent Labs  Lab 09/29/18 1623 09/29/18 1711 09/29/18 2122 09/30/18 0724 09/30/18 1122  GLUCAP 61* 149* 237* 121* 253*    Time coordinating discharge:  36 minutes  Signed:  Orson Eva, DO [facility] Pager: [phone] 09/30/2018, 12:45 PM

## 2018-09-29 NOTE — Progress Notes (Addendum)
PROGRESS NOTE  Eileen [name] BSW:[id number] DOB: 12/04/1945 DOA: 09/26/2018 PCP: Lavella Lemons, PA  Brief History: 73 year old female with a history of COPD,right breast cancer in remission,chronic respiratory failure on 3 L, atrial flutter on rivaroxaban, breast cancer, diabetes mellitus presenting with shortness of breath for the past 3 days. The patient denied any fevers, chills, chest pain, nausea, vomiting, diarrhea, abdominal pain, headache, neck pain, hemoptysis. She states that she was compliant with all her medications at home. She has had 2 recent hospital admissions for COPD exacerbation. Most recently, the patient was recently discharged from 08/19/2020 08/22/2018. In addition, the patient had an admission from 08/10/2018 through 6/8/2020pneumonia and COPD exacerbation. The patient followed up with her pulmonologist, Dr. Luan Pulling. The patient was noted to be hypoxic with oxygen saturation of 70% on 3L.As result, the patient was sent to the emergency department for further evaluation. In the emergency department, the patient was afebrile hemodynamically stable. BMP and CBC were essentially unremarkable with her hemoglobin at baseline. Chest x-ray showed bibasilar atelectasis. The patient was started on Solu-Medrol and bronchodilators with slow clinical improvement.  Assessment/Plan: Acute on chronic respiratory failure with hypoxia -Secondary to COPD exacerbation -Wean oxygen back to baseline as tolerated for saturation greater 92%  COPD exacerbation -Continue Xopenex and Atrovent -Continue Solu-Medrol -Continue Pulmicort  Essential hypertension -Continue metoprololtartrate  Atrial flutter -Continue amiodarone -Continue metoprolol tartrate -Continue rivaroxaban -currently in sinus  Diabetes mellitus type 2, uncontrolled with hyperglycemia -09/29/18 hemoglobin A1c--8.5 -06/24/2018 hemoglobin A1c 8.6 -Resistant NovoLog sliding scale -increase  novolog to 8 units with meas -holding metformin -increase levemir 15 units -Anticipate elevated CBGs secondary to steroids  Chronic L1 compression fracture -PT evaluation      Disposition Plan: Home 7/20 if stable Family Communication:NoFamily at bedside  Consultants:none  Code Status: FULL  DVT Prophylaxis:Xarelto   Procedures: As Listed in Progress Note Above  Antibiotics: None     Subjective: Pt does not feel as good as yesterday.  She has a little bit more dyspnea on exertion. Patient denies fevers, chills, headache, chest pain,  nausea, vomiting, diarrhea, abdominal pain, dysuria, hematuria, hematochezia, and melena.   Objective: Vitals:   09/29/18 0846 09/29/18 0854 09/29/18 1435 09/29/18 1437  BP:   129/77   Pulse:   73   Resp:   18   Temp:      TempSrc:      SpO2: 92% 99% 100% 97%  Weight:      Height:        Intake/Output Summary (Last 24 hours) at 09/29/2018 1550 Last data filed at 09/29/2018 0900 Gross per 24 hour  Intake 480 ml  Output 1100 ml  Net -620 ml   Weight change:  Exam:   General:  Pt is alert, follows commands appropriately, not in acute distress  HEENT: No icterus, No thrush, No neck mass, [place]/AT  Cardiovascular: RRR, S1/S2, no rubs, no gallops  Respiratory: diminished breath sounds bilateral.  Bibasilar rales  Abdomen: Soft/+BS, non tender, non distended, no guarding  Extremities: No edema, No lymphangitis, No petechiae, No rashes, no synovitis   Data Reviewed: I have personally reviewed following labs and imaging studies Basic Metabolic Panel: Recent Labs  Lab 09/26/18 1419 09/27/18 0609 09/28/18 0522 09/29/18 0602  NA 144 144 142 139  K 4.0 4.0 4.6 4.5  CL 98 92* 92* 90*  CO2 37* 40* 38* 38*  GLUCOSE 238* 265* 215* 224*  BUN 13 15 22  22  CREATININE 0.77 0.61 0.64 0.54  CALCIUM 9.0 9.0 9.5 9.5  MG  --  2.0 2.1 2.1   Liver Function Tests: Recent Labs  Lab 09/26/18 1419  AST 24    ALT 20  ALKPHOS 111  BILITOT 0.4  PROT 6.7  ALBUMIN 3.8   No results for input(s): LIPASE, AMYLASE in the last 168 hours. No results for input(s): AMMONIA in the last 168 hours. Coagulation Profile: No results for input(s): INR, PROTIME in the last 168 hours. CBC: Recent Labs  Lab 09/26/18 1419 09/27/18 0609 09/28/18 0522 09/29/18 0602  WBC 9.9 12.0* 16.1* 13.7*  NEUTROABS 8.2*  --   --   --   HGB 11.4* 11.6* 10.8* 11.4*  HCT 39.7 39.7 37.1 39.3  MCV 99.5 97.8 98.4 97.8  PLT 312 332 318 325   Cardiac Enzymes: No results for input(s): CKTOTAL, CKMB, CKMBINDEX, TROPONINI in the last 168 hours. BNP: Invalid input(s): POCBNP CBG: Recent Labs  Lab 09/28/18 1622 09/28/18 1735 09/28/18 2125 09/29/18 0714 09/29/18 1119  GLUCAP 67* 173* 295* 213* 316*   HbA1C: Recent Labs    09/29/18 0602  HGBA1C 8.5*   Urine analysis:    Component Value Date/Time   COLORURINE YELLOW 09/05/2018 0850   APPEARANCEUR CLEAR 09/05/2018 0850   LABSPEC 1.032 (H) 09/05/2018 0850   PHURINE 5.0 09/05/2018 0850   GLUCOSEU >=500 (A) 09/05/2018 0850   HGBUR NEGATIVE 09/05/2018 0850   BILIRUBINUR NEGATIVE 09/05/2018 0850   KETONESUR 5 (A) 09/05/2018 0850   PROTEINUR 100 (A) 09/05/2018 0850   UROBILINOGEN 0.2 09/09/2009 1010   NITRITE NEGATIVE 09/05/2018 0850   LEUKOCYTESUR NEGATIVE 09/05/2018 0850   Sepsis Labs: @LABRCNTIP (procalcitonin:4,lacticidven:4) ) Recent Results (from the past 240 hour(s))  SARS Coronavirus 2 (CEPHEID- Performed in [place] hospital lab), Hosp Order     Status: None   Collection Time: 09/26/18  2:18 PM   Specimen: Nasopharyngeal Swab  Result Value Ref Range Status   SARS Coronavirus 2 NEGATIVE NEGATIVE Final    Comment: (NOTE) If result is NEGATIVE SARS-CoV-2 target nucleic acids are NOT DETECTED. The SARS-CoV-2 RNA is generally detectable in upper and lower  respiratory specimens during the acute phase of infection. The lowest  concentration of  SARS-CoV-2 viral copies this assay can detect is 250  copies / mL. A negative result does not preclude SARS-CoV-2 infection  and should not be used as the sole basis for treatment or other  patient management decisions.  A negative result may occur with  improper specimen collection / handling, submission of specimen other  than nasopharyngeal swab, presence of viral mutation(s) within the  areas targeted by this assay, and inadequate number of viral copies  (<250 copies / mL). A negative result must be combined with clinical  observations, patient history, and epidemiological information. If result is POSITIVE SARS-CoV-2 target nucleic acids are DETECTED. The SARS-CoV-2 RNA is generally detectable in upper and lower  respiratory specimens dur ing the acute phase of infection.  Positive  results are indicative of active infection with SARS-CoV-2.  Clinical  correlation with patient history and other diagnostic information is  necessary to determine patient infection status.  Positive results do  not rule out bacterial infection or co-infection with other viruses. If result is PRESUMPTIVE POSTIVE SARS-CoV-2 nucleic acids MAY BE PRESENT.   A presumptive positive result was obtained on the submitted specimen  and confirmed on repeat testing.  While 2019 novel coronavirus  (SARS-CoV-2) nucleic acids may be present in  the submitted sample  additional confirmatory testing may be necessary for epidemiological  and / or clinical management purposes  to differentiate between  SARS-CoV-2 and other Sarbecovirus currently known to infect humans.  If clinically indicated additional testing with an alternate test  methodology [phone]) is advised. The SARS-CoV-2 RNA is generally  detectable in upper and lower respiratory sp ecimens during the acute  phase of infection. The expected result is Negative. Fact Sheet for Patients:  StrictlyIdeas.no Fact Sheet for Healthcare  Providers: BankingDealers.co.za This test is not yet approved or cleared by the [place] FDA and has been authorized for detection and/or diagnosis of SARS-CoV-2 by FDA under an Emergency Use Authorization (EUA).  This EUA will remain in effect (meaning this test can be used) for the duration of the COVID-19 declaration under Section 564(b)(1) of the Act, 21 U.S.C. section 360bbb-3(b)(1), unless the authorization is terminated or revoked sooner. Performed at [facility], [address] [place], [place] [address]   MRSA PCR Screening     Status: None   Collection Time: 09/26/18  5:20 PM   Specimen: Nasal Mucosa; Nasopharyngeal  Result Value Ref Range Status   MRSA by PCR NEGATIVE NEGATIVE Final    Comment:        The GeneXpert MRSA Assay (FDA approved for NASAL specimens only), is one component of a comprehensive MRSA colonization surveillance program. It is not intended to diagnose MRSA infection nor to guide or monitor treatment for MRSA infections. Performed at [facility], [address], [place],  [address]      Scheduled Meds:  amiodarone  200 mg Oral Daily   budesonide (PULMICORT) nebulizer solution  0.5 mg Nebulization BID   Chlorhexidine Gluconate Cloth  6 each Topical Q0600   docusate sodium  100 mg Oral BID   furosemide  80 mg Oral Daily   insulin aspart  0-20 Units Subcutaneous TID WC   insulin aspart  0-5 Units Subcutaneous QHS   insulin aspart  8 Units Subcutaneous TID WC   insulin detemir  15 Units Subcutaneous Daily   ipratropium  0.5 mg Nebulization TID   levalbuterol  1.25 mg Nebulization TID   mouth rinse  15 mL Mouth Rinse BID   metoprolol tartrate  50 mg Oral BID   predniSONE  60 mg Oral Q breakfast   rivaroxaban  20 mg Oral Q supper   Continuous Infusions:  Procedures/Studies: Ct Angio Chest Pe W Or Wo Contrast  Result Date: 09/27/2018 CLINICAL DATA:  Increased shortness of breath over the last  few weeks. History of COPD. Right breast cancer in remission. EXAM: CT ANGIOGRAPHY CHEST WITH CONTRAST TECHNIQUE: Multidetector CT imaging of the chest was performed using the standard protocol during bolus administration of intravenous contrast. Multiplanar CT image reconstructions and MIPs were obtained to evaluate the vascular anatomy. CONTRAST:  71mL OMNIPAQUE IOHEXOL 350 MG/ML SOLN COMPARISON:  Chest radiograph, 09/26/2018. FINDINGS: Cardiovascular: There is satisfactory opacification of the pulmonary arteries to the segmental level. There is no evidence of a pulmonary embolism. Main pulmonary artery is dilated to 3.7 cm. Heart is borderline enlarged. No pericardial effusion. Minor left and right coronary artery calcifications. Aorta is normal in caliber. No dissection. Partly calcified atherosclerotic plaque at the origin of the left subclavian artery. No significant stenosis. Mediastinum/Nodes: No enlarged mediastinal, hilar, or axillary lymph nodes. Thyroid gland, trachea, and esophagus demonstrate no significant findings. Lungs/Pleura: Advanced centrilobular emphysema. Linear and discoid type opacities at the lung bases consistent with atelectasis. No evidence  of pneumonia or pulmonary edema. No lung mass or suspicious nodule. No pleural effusion or pneumothorax. Upper Abdomen: No acute findings. Nonobstructing intrarenal stones. Renal cortical thinning bilaterally low-density renal masses consistent with cysts. Musculoskeletal: Chronic compression fracture of L1 treated with vertebroplasty. No other fractures. No osteoblastic or osteolytic lesions. Previous right mastectomy.  No chest wall masses. Review of the MIP images confirms the above findings. IMPRESSION: 1. No evidence of a pulmonary embolism. 2. No acute findings. 3. Advanced COPD/emphysema.  Mild lung base atelectasis. 4. Enlargement of the main pulmonary artery suggesting pulmonary hypertension. Emphysema (ICD10-J43.9). Electronically Signed    By: Lajean Manes M.D.   On: 09/27/2018 15:36   Dg Chest Portable 1 View  Result Date: 09/26/2018 CLINICAL DATA:  Shortness of breath. EXAM: PORTABLE CHEST 1 VIEW COMPARISON:  Radiographs of August 20, 2018. FINDINGS: Stable cardiomediastinal silhouette. No pneumothorax is noted. Mild bibasilar subsegmental atelectasis is noted. Bony thorax is unremarkable. IMPRESSION: Mild bibasilar subsegmental atelectasis. Electronically Signed   By: Marijo Conception M.D.   On: 09/26/2018 14:13   Ir Vertebroplasty Lumbar Bx Inc Uni/bil Inc Inject/imaging  Result Date: 09/06/2018 INDICATION: Severe low back pain secondary to compression fracture at L1. EXAM: VERTEBROPLASTY AT L1 MEDICATIONS: As antibiotic prophylaxis, Ancef 2 g IV was ordered pre-procedure and administered intravenously within 1 hour of incision. ANESTHESIA/SEDATION: Moderate (conscious) sedation was employed during this procedure. A total of Versed 1 mg and Fentanyl 25 mcg was administered intravenously. Moderate Sedation Time: 14 minutes. The patient's level of consciousness and vital signs were monitored continuously by radiology nursing throughout the procedure under my direct supervision. FLUOROSCOPY TIME:  Fluoroscopy Time: 4 minutes 30 seconds (144 mGy) COMPLICATIONS: None immediate. TECHNIQUE: Informed written consent was obtained from the patient after a thorough discussion of the procedural risks, benefits and alternatives. All questions were addressed. Maximal Sterile Barrier Technique was utilized including caps, mask, sterile gowns, sterile gloves, sterile drape, hand hygiene and skin antiseptic. A timeout was performed prior to the initiation of the procedure. PROCEDURE: The patient was placed prone on the fluoroscopic table. Nasal oxygen was administered. Physiologic monitoring was performed throughout the duration of the procedure. The skin overlying the thoracolumbar region was prepped and draped in the usual sterile fashion. The L1 vertebral  body was identified and the right pedicle was infiltrated with 0.25% Bupivacaine. This was then followed by the advancement of a 13-gauge Cook needle through the right pedicle into the anterior one-third at L1. A gentle contrast injection demonstrated a trabecular pattern of contrast. At this time, methylmethacrylate mixture was reconstituted. Under biplane intermittent fluoroscopy, the methylmethacrylate was then injected into the L1 vertebral body with filling of the vertebral body. No extravasation was noted into the disk spaces or posteriorly into the spinal canal. No epidural venous contamination was seen. The needle was then removed. Hemostasis was achieved at the skin entry site. There were no acute complications. Patient tolerated the procedure well. The patient was observed for 3 hours and discharged in good condition. IMPRESSION: 1. Status post vertebral body augmentation for painful compression fracture at L1 using vertebroplasty technique. Electronically Signed   By: Luanne Bras M.D.   On: 09/05/2018 11:02    Orson Eva, DO  [facility] Pager [phone]  If 7PM-7AM, please contact night-coverage [email or web address] Password [email or web address] 09/29/2018, 3:50 PM   LOS: 3 days

## 2018-09-30 LAB — BASIC METABOLIC PANEL
Anion gap: 11 (ref 5–15)
BUN: 21 mg/dL (ref 8–23)
CO2: 42 mmol/L — ABNORMAL HIGH (ref 22–32)
Calcium: 9.6 mg/dL (ref 8.9–10.3)
Chloride: 91 mmol/L — ABNORMAL LOW (ref 98–111)
Creatinine, Ser: 0.66 mg/dL (ref 0.44–1.00)
GFR calc Af Amer: >60 mL/min (ref >60)
GFR calc non Af Amer: >60 mL/min (ref >60)
Glucose, Bld: 95 mg/dL (ref 70–99)
Potassium: 4.3 mmol/L (ref 3.5–5.1)
Sodium: 144 mmol/L (ref 135–145)

## 2018-09-30 LAB — CBC
HCT: 43.1 % (ref 36.0–46.0)
Hemoglobin: 12.2 g/dL (ref 12.0–15.0)
MCH: 28 pg (ref 26.0–34.0)
MCHC: 28.3 g/dL — ABNORMAL LOW (ref 30.0–36.0)
MCV: 99.1 fL (ref 80.0–100.0)
Platelets: 332 10*3/uL (ref 150–400)
RBC: 4.35 MIL/uL (ref 3.87–5.11)
RDW: 13.9 % (ref 11.5–15.5)
WBC: 11.5 10*3/uL — ABNORMAL HIGH (ref 4.0–10.5)
nRBC: 0 % (ref 0.0–0.2)

## 2018-09-30 LAB — GLUCOSE, CAPILLARY
Glucose-Capillary: 121 mg/dL — ABNORMAL HIGH (ref 70–99)
Glucose-Capillary: 253 mg/dL — ABNORMAL HIGH (ref 70–99)

## 2018-09-30 LAB — MAGNESIUM: Magnesium: 2.2 mg/dL (ref 1.7–2.4)

## 2018-09-30 MED ORDER — PREDNISONE 10 MG PO TABS: 60.0000 mg | ORAL_TABLET | Freq: Every day | ORAL | 0 refills | Status: DC

## 2018-09-30 NOTE — Progress Notes (Signed)
Nsg Discharge Note  Admit Date:  09/26/2018 Discharge date: 09/30/2018   [name] Lavallie to be D/C'd Home per MD order.  AVS completed.  Patient able to verbalize understanding.  Discharge Medication: Allergies as of 09/30/2018      Reactions   Codeine Other (See Comments)   "jittery"      Medication List    STOP taking these medications   terconazole 0.8 % vaginal cream Commonly known as: TERAZOL 3     TAKE these medications   acetaminophen 500 MG tablet Commonly known as: TYLENOL Take 500 mg by mouth every 8 (eight) hours as needed for mild pain or headache.   amiodarone 200 MG tablet Commonly known as: PACERONE Take 1 tablet (200 mg total) by mouth daily.   docusate sodium 100 MG capsule Commonly known as: Colace Take 1 capsule (100 mg total) by mouth 2 (two) times daily. What changed: when to take this   furosemide 40 MG tablet Commonly known as: LASIX Take 80 mg by mouth daily.   guaiFENesin 600 MG 12 hr tablet Commonly known as: MUCINEX Take 1 tablet (600 mg total) by mouth 2 (two) times daily.   metFORMIN 500 MG 24 hr tablet Commonly known as: GLUCOPHAGE-XR Take 500 mg by mouth daily with breakfast.   metoprolol tartrate 50 MG tablet Commonly known as: LOPRESSOR Take 50 mg by mouth 2 (two) times daily.   OXYGEN Inhale 2.5-3 L into the lungs continuous.   polyethylene glycol 17 g packet Commonly known as: MIRALAX / GLYCOLAX Take 17 g by mouth daily as needed for mild constipation.   potassium chloride SA 20 MEQ tablet Commonly known as: K-DUR Take 20 mEq by mouth 2 (two) times daily.   predniSONE 10 MG tablet Commonly known as: DELTASONE Take 10 mg by mouth daily with breakfast. What changed: Another medication with the same name was added. Make sure you understand how and when to take each.   predniSONE 10 MG tablet Commonly known as: DELTASONE Take 6 tablets (60 mg total) by mouth daily with breakfast. And decrease by one tablet daily Start  taking on: October 01, 2018 What changed: You were already taking a medication with the same name, and this prescription was added. Make sure you understand how and when to take each.   albuterol (2.5 MG/3ML) 0.083% nebulizer solution Commonly known as: PROVENTIL Take 2.5 mg by nebulization every 6 (six) hours as needed for wheezing or shortness of breath.   ProAir HFA 108 (90 Base) MCG/ACT inhaler Generic drug: albuterol Inhale 2 puffs into the lungs every 6 (six) hours as needed for wheezing or shortness of breath.   rivaroxaban 20 MG Tabs tablet Commonly known as: Xarelto Take 1 tablet (20 mg total) by mouth daily with supper. Stop Xarelto until Monday (08/26/18) in anticipation for kyphoplasty   Trelegy Ellipta 100-62.5-25 MCG/INH Aepb Generic drug: Fluticasone-Umeclidin-Vilant Take 1 puff by mouth daily.       Discharge Assessment: Vitals:   09/30/18 0825 09/30/18 0826  BP:    Pulse:    Resp:    Temp:    SpO2: 99% 99%   Skin clean, dry and intact without evidence of skin break down, no evidence of skin tears noted. IV catheter discontinued intact. Site without signs and symptoms of complications - no redness or edema noted at insertion site, patient denies c/o pain - only slight tenderness at site.  Dressing with slight pressure applied.  D/c Instructions-Education: Discharge instructions given to patient with  verbalized understanding. D/c education completed with patient including follow up instructions, medication list, d/c activities limitations if indicated, with other d/c instructions as indicated by MD - patient able to verbalize understanding, all questions fully answered. Patient instructed to return to ED, call 911, or call MD for any changes in condition.  Patient escorted via [place], and D/C home via private auto.  Deshonda Cryderman C, RN 09/30/2018 1:11 PM

## 2018-09-30 NOTE — TOC Transition Note (Signed)
Transition of Care [facility]) - CM/SW Discharge Note   Patient Details  Name: Eileen [name] MRN: [id number] Date of Birth: 11-18-1945  [facility] CM/SW Contact:  Shade Flood, LCSW Phone Number: 09/30/2018, 11:23 AM   Clinical Narrative:     TOC following. Pt discussed in Progression today. MD indicates pt likely will dc today. Pt has indicated she does not want HH or SNF. Updated [facility] CM of pt's stay and dc as she is active with their services.   No other TOC needs for dc.   Final next level of care: Home/Self Care Barriers to Discharge: No Barriers Identified   Patient Goals and CMS Choice Patient states their goals for this hospitalization and ongoing recovery are:: to return home to [facility]      Discharge Placement                       Discharge Plan and Services In-house Referral: [facility]                                   Social Determinants of Health (SDOH) Interventions     Readmission Risk Interventions Readmission Risk Prevention Plan 08/22/2018 08/21/2018 08/19/2018  Transportation Screening Complete Complete -  PCP or Specialist Appt within 5-7 Days Complete - Complete  Home Care Screening Complete Complete -  Medication Review (RN CM) Complete - Complete  Some recent data might be hidden

## 2018-10-02 ENCOUNTER — Other Ambulatory Visit: Payer: Self-pay | Admitting: *Deleted

## 2018-10-02 ENCOUNTER — Encounter: Payer: Self-pay | Admitting: *Deleted

## 2018-10-02 NOTE — Patient Outreach (Signed)
Outreach call to pt for hospital follow up, pt hospitalized 09/26/18-09/12/18 for dyspnea, COPD exacerbation, Primary care MD completes transition of care, spoke with pt, HIPAA verified, pt states "breathing is better" pt states she cannot remember what last 02 sat was,  RN CM reviewed normal range for 02 saturations,  Pt states CBG "around 200 while I'm on prednisone"  Pt states her back is healing well and continues to have pain rated 6 at times, continues taking tylenol,  Pt states she has telephone visit next week with surgeon, has telephone visit with primary care on 10/15/18.  No new concerns voiced.  RN CM faxed today's note to primary MD.  [facility] CM Care Plan Problem One     Most Recent Value  Care Plan Problem One  Knowledge deficit related to COPD  Role Documenting the Problem One  Care Management [place] for Problem One  Active  [facility] Long Term Goal   Pt will verbalize improved self care for COPD within 60 days.  [facility] Long Term Goal Start Date  08/23/18 Barrie Folk re-established]  Interventions for Problem One Long Term Goal  RN CM reinforced plan of care with pt, reviewed medications, reiterated with pt that pharmacist at [facility] will be assisting her with cost of medications, pt states she is aware and knows she is to speak with them.    Pt has follow up appointment with primary care on 10/15/18  [facility] CM Short Term Goal #1   Pt will verbalize COPD action plan/ zones within 30 days  [facility] CM Short Term Goal #1 Start Date  09/12/18 [goal re-established]  Interventions for Short Term Goal #1  Rn CM reinforced COPD action plan/ zones, pt is in green zone today, continues using oxygen 3.5 liters 24/7  [facility] CM Short Term Goal #2   Pt will verbalize signs/ symptoms exacerbation of A-Fib/ A-flutter within 30 days.  [facility] CM Short Term Goal #2 Start Date  07/22/18 [goal re-established]  [facility] CM Short Term Goal #2 Met Date  08/23/18      Plan Outreach pt for telephone assessment within 2  weeks  Jacqlyn Larsen [facility], [place] Coordinator [phone]

## 2018-10-07 ENCOUNTER — Other Ambulatory Visit: Payer: Self-pay | Admitting: *Deleted

## 2018-10-07 NOTE — Patient Outreach (Signed)
Outreach call to pt for follow up on Red EMMI flag 10/05/18- for feelings of sadness and hopelessness, spoke with pt, HIPAA verified, pt states "no, that's not correct, I'm doing fine"  No new concerns or issues voiced.  Jacqlyn Larsen [facility], [place] Coordinator [phone]

## 2018-10-09 DIAGNOSIS — J449 Chronic obstructive pulmonary disease, unspecified: Secondary | ICD-10-CM | POA: Diagnosis not present

## 2018-10-09 DIAGNOSIS — R0902 Hypoxemia: Secondary | ICD-10-CM | POA: Diagnosis not present

## 2018-10-11 DIAGNOSIS — E1165 Type 2 diabetes mellitus with hyperglycemia: Secondary | ICD-10-CM | POA: Diagnosis not present

## 2018-10-11 DIAGNOSIS — I10 Essential (primary) hypertension: Secondary | ICD-10-CM | POA: Diagnosis not present

## 2018-10-14 ENCOUNTER — Encounter: Payer: Self-pay | Admitting: *Deleted

## 2018-10-14 ENCOUNTER — Other Ambulatory Visit: Payer: Self-pay | Admitting: *Deleted

## 2018-10-14 NOTE — Patient Outreach (Signed)
Outreach call to pt for telephone assessment, spoke with pt, HIPAA verified, pt reports she will speak with pharmacist at [place] about medication affordability,  RN CM reminded pt that if she does not hear from pharmacist or speak with them during her MD visit, to call and remind them that she needs assistnace.  Pt verbalizes understanding and states she is able to do this.  Pt states home health discharged her last week, pt states CBG readings in 100's range with most recent readings 133, 180 today, continues on prednisone which has elevated readings per pt.  Pt continues to have dyspnea with exertion and uses oxygen 24/7 at 3.5 liters.  No new concerns voiced today.  RN CM discussed discharge plan with pt and due to pt being out of the hospital less than a month, RN CM will contact pt again next month.  THN CM Care Plan Problem One     Most Recent Value  Care Plan Problem One  Knowledge deficit related to COPD  Role Documenting the Problem One  Care Management Coordinator  Care Plan for Problem One  Active  THN Long Term Goal   Pt will verbalize improved self care for COPD within 60 days.  THN Long Term Goal Start Date  08/23/18 Barrie Folk re-established]  Interventions for Problem One Long Term Goal  RN CM reviewed plan of care with pt, pt states she will speak with someone about pharmacy needs at primary care visit this week.  Pt states she is to have telephone visit with surgeon this week.  THN CM Short Term Goal #1   Pt will verbalize COPD action plan/ zones within 30 days  THN CM Short Term Goal #1 Start Date  10/14/18 [goal re-established]  Interventions for Short Term Goal #1  RN CM reviewed COPD action plan, reminded pt to call MD early for change in health status, pt states she is green zone today although she does have chronic dyspnea with exertion  THN CM Short Term Goal #2   Pt will verbalize signs/ symptoms exacerbation of A-Fib/ A-flutter within 30 days.  THN CM Short Term Goal #2 Start  Date  07/22/18 [goal re-established]  [facility] CM Short Term Goal #2 Met Date  08/23/18      PLAN Outreach pt next month for telephone assessment  Jacqlyn Larsen [facility], [place] Coordinator [phone]

## 2018-10-18 ENCOUNTER — Inpatient Hospital Stay (HOSPITAL_COMMUNITY)
Admission: EM | Admit: 2018-10-18 | Discharge: 2018-10-21 | DRG: 190 | Disposition: A | Discharge status: Home / Self Care | Payer: Medicare Other | Attending: Internal Medicine | Admitting: Internal Medicine

## 2018-10-18 ENCOUNTER — Other Ambulatory Visit: Payer: Self-pay

## 2018-10-18 ENCOUNTER — Encounter (HOSPITAL_COMMUNITY): Payer: Self-pay

## 2018-10-18 ENCOUNTER — Emergency Department (HOSPITAL_COMMUNITY): Payer: Medicare Other

## 2018-10-18 DIAGNOSIS — I4891 Unspecified atrial fibrillation: Secondary | ICD-10-CM | POA: Diagnosis not present

## 2018-10-18 DIAGNOSIS — Z7901 Long term (current) use of anticoagulants: Secondary | ICD-10-CM | POA: Diagnosis not present

## 2018-10-18 DIAGNOSIS — R0602 Shortness of breath: Secondary | ICD-10-CM | POA: Diagnosis not present

## 2018-10-18 DIAGNOSIS — Z853 Personal history of malignant neoplasm of breast: Secondary | ICD-10-CM

## 2018-10-18 DIAGNOSIS — J471 Bronchiectasis with (acute) exacerbation: Secondary | ICD-10-CM | POA: Diagnosis not present

## 2018-10-18 DIAGNOSIS — Z9981 Dependence on supplemental oxygen: Secondary | ICD-10-CM

## 2018-10-18 DIAGNOSIS — J189 Pneumonia, unspecified organism: Secondary | ICD-10-CM | POA: Diagnosis not present

## 2018-10-18 DIAGNOSIS — Z9011 Acquired absence of right breast and nipple: Secondary | ICD-10-CM | POA: Diagnosis not present

## 2018-10-18 DIAGNOSIS — Z20828 Contact with and (suspected) exposure to other viral communicable diseases: Secondary | ICD-10-CM | POA: Diagnosis present

## 2018-10-18 DIAGNOSIS — E1165 Type 2 diabetes mellitus with hyperglycemia: Secondary | ICD-10-CM | POA: Diagnosis present

## 2018-10-18 DIAGNOSIS — D649 Anemia, unspecified: Secondary | ICD-10-CM

## 2018-10-18 DIAGNOSIS — Z7984 Long term (current) use of oral hypoglycemic drugs: Secondary | ICD-10-CM | POA: Diagnosis not present

## 2018-10-18 DIAGNOSIS — Z87891 Personal history of nicotine dependence: Secondary | ICD-10-CM | POA: Diagnosis not present

## 2018-10-18 DIAGNOSIS — J441 Chronic obstructive pulmonary disease with (acute) exacerbation: Secondary | ICD-10-CM | POA: Diagnosis not present

## 2018-10-18 DIAGNOSIS — D509 Iron deficiency anemia, unspecified: Secondary | ICD-10-CM | POA: Diagnosis not present

## 2018-10-18 DIAGNOSIS — Z7952 Long term (current) use of systemic steroids: Secondary | ICD-10-CM

## 2018-10-18 DIAGNOSIS — E1169 Type 2 diabetes mellitus with other specified complication: Secondary | ICD-10-CM

## 2018-10-18 DIAGNOSIS — I110 Hypertensive heart disease with heart failure: Secondary | ICD-10-CM | POA: Diagnosis not present

## 2018-10-18 DIAGNOSIS — Z833 Family history of diabetes mellitus: Secondary | ICD-10-CM

## 2018-10-18 DIAGNOSIS — Z7951 Long term (current) use of inhaled steroids: Secondary | ICD-10-CM | POA: Diagnosis not present

## 2018-10-18 DIAGNOSIS — E119 Type 2 diabetes mellitus without complications: Secondary | ICD-10-CM

## 2018-10-18 DIAGNOSIS — J9601 Acute respiratory failure with hypoxia: Secondary | ICD-10-CM | POA: Diagnosis not present

## 2018-10-18 DIAGNOSIS — I340 Nonrheumatic mitral (valve) insufficiency: Secondary | ICD-10-CM | POA: Diagnosis not present

## 2018-10-18 DIAGNOSIS — I4892 Unspecified atrial flutter: Secondary | ICD-10-CM | POA: Diagnosis not present

## 2018-10-18 DIAGNOSIS — Y95 Nosocomial condition: Secondary | ICD-10-CM | POA: Diagnosis present

## 2018-10-18 DIAGNOSIS — J470 Bronchiectasis with acute lower respiratory infection: Secondary | ICD-10-CM | POA: Diagnosis not present

## 2018-10-18 DIAGNOSIS — J9621 Acute and chronic respiratory failure with hypoxia: Secondary | ICD-10-CM | POA: Diagnosis present

## 2018-10-18 DIAGNOSIS — I5032 Chronic diastolic (congestive) heart failure: Secondary | ICD-10-CM | POA: Diagnosis not present

## 2018-10-18 LAB — BASIC METABOLIC PANEL
Anion gap: 11 (ref 5–15)
BUN: 12 mg/dL (ref 8–23)
CO2: 37 mmol/L — ABNORMAL HIGH (ref 22–32)
Calcium: 9.6 mg/dL (ref 8.9–10.3)
Chloride: 94 mmol/L — ABNORMAL LOW (ref 98–111)
Creatinine, Ser: 0.61 mg/dL (ref 0.44–1.00)
GFR calc Af Amer: >60 mL/min (ref >60)
GFR calc non Af Amer: >60 mL/min (ref >60)
Glucose, Bld: 291 mg/dL — ABNORMAL HIGH (ref 70–99)
Potassium: 5 mmol/L (ref 3.5–5.1)
Sodium: 142 mmol/L (ref 135–145)

## 2018-10-18 LAB — GLUCOSE, CAPILLARY: Glucose-Capillary: 278 mg/dL — ABNORMAL HIGH (ref 70–99)

## 2018-10-18 LAB — CBC WITH DIFFERENTIAL/PLATELET
Abs Immature Granulocytes: 0.07 10*3/uL (ref 0.00–0.07)
Basophils Absolute: 0 10*3/uL (ref 0.0–0.1)
Basophils Relative: 0 %
Eosinophils Absolute: 0 10*3/uL (ref 0.0–0.5)
Eosinophils Relative: 0 %
HCT: 41.9 % (ref 36.0–46.0)
Hemoglobin: 11.5 g/dL — ABNORMAL LOW (ref 12.0–15.0)
Immature Granulocytes: 1 %
Lymphocytes Relative: 13 %
Lymphs Abs: 1.3 10*3/uL (ref 0.7–4.0)
MCH: 28 pg (ref 26.0–34.0)
MCHC: 27.4 g/dL — ABNORMAL LOW (ref 30.0–36.0)
MCV: 101.9 fL — ABNORMAL HIGH (ref 80.0–100.0)
Monocytes Absolute: 0.5 10*3/uL (ref 0.1–1.0)
Monocytes Relative: 5 %
Neutro Abs: 7.8 10*3/uL — ABNORMAL HIGH (ref 1.7–7.7)
Neutrophils Relative %: 81 %
Platelets: 232 10*3/uL (ref 150–400)
RBC: 4.11 MIL/uL (ref 3.87–5.11)
RDW: 13.9 % (ref 11.5–15.5)
WBC: 9.6 10*3/uL (ref 4.0–10.5)
nRBC: 0 % (ref 0.0–0.2)

## 2018-10-18 LAB — TROPONIN I (HIGH SENSITIVITY)
Troponin I (High Sensitivity): 9 ng/L (ref <18)
Troponin I (High Sensitivity): 10 ng/L (ref <18)

## 2018-10-18 LAB — SARS CORONAVIRUS 2 BY RT PCR (HOSPITAL ORDER, PERFORMED IN ~~LOC~~ HOSPITAL LAB): SARS Coronavirus 2: NEGATIVE

## 2018-10-18 MED ORDER — LEVALBUTEROL HCL 1.25 MG/0.5ML IN NEBU: 1.2500 mg | INHALATION_SOLUTION | Freq: Four times a day (QID) | RESPIRATORY_TRACT | Status: DC | PRN

## 2018-10-18 MED ORDER — VANCOMYCIN HCL IN DEXTROSE 750-5 MG/150ML-% IV SOLN
750.0000 mg | Freq: Once | INTRAVENOUS | Status: AC
  Administered 2018-10-19: 750 mg via INTRAVENOUS
  Filled 2018-10-18: qty 150

## 2018-10-18 MED ORDER — INSULIN ASPART 100 UNIT/ML ~~LOC~~ SOLN
0.0000 [IU] | Freq: Every day | SUBCUTANEOUS | Status: DC
  Administered 2018-10-19: 2 [IU] via SUBCUTANEOUS
  Administered 2018-10-19 – 2018-10-20 (×2): 3 [IU] via SUBCUTANEOUS

## 2018-10-18 MED ORDER — RIVAROXABAN 20 MG PO TABS
20.0000 mg | ORAL_TABLET | Freq: Every day | ORAL | Status: DC
  Administered 2018-10-19 – 2018-10-21 (×4): 20 mg via ORAL
  Filled 2018-10-18 (×4): qty 1

## 2018-10-18 MED ORDER — METHYLPREDNISOLONE SODIUM SUCC 125 MG IJ SOLR
80.0000 mg | Freq: Three times a day (TID) | INTRAMUSCULAR | Status: DC
  Administered 2018-10-19 – 2018-10-21 (×8): 80 mg via INTRAVENOUS
  Filled 2018-10-18 (×8): qty 2

## 2018-10-18 MED ORDER — VANCOMYCIN HCL IN DEXTROSE 1-5 GM/200ML-% IV SOLN: 1000.0000 mg | Freq: Once | INTRAVENOUS | Status: DC

## 2018-10-18 MED ORDER — AMIODARONE HCL 200 MG PO TABS
200.0000 mg | ORAL_TABLET | Freq: Every day | ORAL | Status: DC
  Administered 2018-10-19 – 2018-10-21 (×3): 200 mg via ORAL
  Filled 2018-10-18 (×3): qty 1

## 2018-10-18 MED ORDER — INSULIN ASPART 100 UNIT/ML ~~LOC~~ SOLN
0.0000 [IU] | Freq: Three times a day (TID) | SUBCUTANEOUS | Status: DC
  Administered 2018-10-19: 3 [IU] via SUBCUTANEOUS
  Administered 2018-10-19: 2 [IU] via SUBCUTANEOUS
  Administered 2018-10-19: 3 [IU] via SUBCUTANEOUS
  Administered 2018-10-20: 1 [IU] via SUBCUTANEOUS
  Administered 2018-10-20: 3 [IU] via SUBCUTANEOUS
  Administered 2018-10-20: 2 [IU] via SUBCUTANEOUS
  Administered 2018-10-21: 3 [IU] via SUBCUTANEOUS
  Administered 2018-10-21: 7 [IU] via SUBCUTANEOUS
  Administered 2018-10-21: 1 [IU] via SUBCUTANEOUS

## 2018-10-18 MED ORDER — ALBUTEROL SULFATE HFA 108 (90 BASE) MCG/ACT IN AERS
2.0000 | INHALATION_SPRAY | Freq: Once | RESPIRATORY_TRACT | Status: AC
  Administered 2018-10-18: 2 via RESPIRATORY_TRACT
  Filled 2018-10-18: qty 6.7

## 2018-10-18 MED ORDER — DOCUSATE SODIUM 100 MG PO CAPS
100.0000 mg | ORAL_CAPSULE | Freq: Every day | ORAL | Status: DC
  Administered 2018-10-19 – 2018-10-21 (×3): 100 mg via ORAL
  Filled 2018-10-18 (×3): qty 1

## 2018-10-18 MED ORDER — FUROSEMIDE 80 MG PO TABS
80.0000 mg | ORAL_TABLET | Freq: Every day | ORAL | Status: DC
  Administered 2018-10-19 – 2018-10-21 (×3): 80 mg via ORAL
  Filled 2018-10-18: qty 1
  Filled 2018-10-18: qty 2
  Filled 2018-10-18: qty 1

## 2018-10-18 MED ORDER — LEVALBUTEROL HCL 1.25 MG/0.5ML IN NEBU
1.2500 mg | INHALATION_SOLUTION | Freq: Four times a day (QID) | RESPIRATORY_TRACT | Status: DC
  Administered 2018-10-19: 1.25 mg via RESPIRATORY_TRACT
  Filled 2018-10-18: qty 0.5

## 2018-10-18 MED ORDER — METOPROLOL TARTRATE 50 MG PO TABS
50.0000 mg | ORAL_TABLET | Freq: Two times a day (BID) | ORAL | Status: DC
  Administered 2018-10-19 – 2018-10-21 (×6): 50 mg via ORAL
  Filled 2018-10-18 (×6): qty 1

## 2018-10-18 MED ORDER — METHYLPREDNISOLONE SODIUM SUCC 125 MG IJ SOLR
125.0000 mg | Freq: Once | INTRAMUSCULAR | Status: AC
  Administered 2018-10-18: 125 mg via INTRAVENOUS
  Filled 2018-10-18: qty 2

## 2018-10-18 MED ORDER — FLUTICASONE-UMECLIDIN-VILANT 100-62.5-25 MCG/INH IN AEPB: 1.0000 | INHALATION_SPRAY | Freq: Every day | RESPIRATORY_TRACT | Status: DC

## 2018-10-18 MED ORDER — LEVALBUTEROL HCL 1.25 MG/0.5ML IN NEBU
INHALATION_SOLUTION | RESPIRATORY_TRACT | Status: AC
  Filled 2018-10-18: qty 0.5

## 2018-10-18 MED ORDER — VANCOMYCIN HCL IN DEXTROSE 750-5 MG/150ML-% IV SOLN: 750.0000 mg | Freq: Two times a day (BID) | INTRAVENOUS | Status: DC

## 2018-10-18 MED ORDER — SODIUM CHLORIDE 0.9 % IV SOLN
2.0000 g | Freq: Two times a day (BID) | INTRAVENOUS | Status: DC
  Administered 2018-10-19: 2 g via INTRAVENOUS
  Filled 2018-10-18 (×2): qty 2

## 2018-10-18 MED ORDER — METFORMIN HCL ER 500 MG PO TB24
500.0000 mg | ORAL_TABLET | Freq: Every day | ORAL | Status: DC
  Administered 2018-10-19: 500 mg via ORAL
  Filled 2018-10-18: qty 1

## 2018-10-18 MED ORDER — SODIUM CHLORIDE 0.9 % IV SOLN: 1.0000 g | INTRAVENOUS | Status: DC

## 2018-10-18 MED ORDER — SODIUM CHLORIDE 0.9 % IV SOLN
1.0000 g | Freq: Once | INTRAVENOUS | Status: AC
  Administered 2018-10-18: 1 g via INTRAVENOUS
  Filled 2018-10-18: qty 10

## 2018-10-18 MED ORDER — POTASSIUM CHLORIDE CRYS ER 20 MEQ PO TBCR
20.0000 meq | EXTENDED_RELEASE_TABLET | Freq: Two times a day (BID) | ORAL | Status: DC
  Administered 2018-10-19 – 2018-10-21 (×6): 20 meq via ORAL
  Filled 2018-10-18 (×6): qty 1

## 2018-10-18 MED ORDER — SODIUM CHLORIDE 0.9 % IV SOLN
INTRAVENOUS | Status: AC
  Administered 2018-10-19: via INTRAVENOUS

## 2018-10-18 MED ORDER — POLYETHYLENE GLYCOL 3350 17 G PO PACK: 17.0000 g | PACK | Freq: Every day | ORAL | Status: DC | PRN

## 2018-10-18 MED ORDER — SODIUM CHLORIDE 0.9 % IV SOLN
500.0000 mg | INTRAVENOUS | Status: DC
  Administered 2018-10-19: 500 mg via INTRAVENOUS
  Filled 2018-10-18: qty 500

## 2018-10-18 MED ORDER — LEVALBUTEROL HCL 1.25 MG/0.5ML IN NEBU
1.2500 mg | INHALATION_SOLUTION | Freq: Once | RESPIRATORY_TRACT | Status: AC
  Administered 2018-10-18: 1.25 mg via RESPIRATORY_TRACT
  Filled 2018-10-18: qty 0.5

## 2018-10-18 MED ORDER — VANCOMYCIN HCL IN DEXTROSE 1-5 GM/200ML-% IV SOLN
1000.0000 mg | Freq: Once | INTRAVENOUS | Status: AC
  Administered 2018-10-19: 1000 mg via INTRAVENOUS
  Filled 2018-10-18: qty 200

## 2018-10-18 NOTE — ED Notes (Signed)
Almyra Free PA updated on hypoxia

## 2018-10-18 NOTE — ED Provider Notes (Signed)
[facility] EMERGENCY DEPARTMENT Provider Note   CSN: [id number] Arrival date & time: 10/18/18  1751    History   Chief Complaint Chief Complaint  Patient presents with  . HYPOXIA    HPI Eileen [name] is a 73 y.o. female with a history of COPD, chronic respiratory failure on 3.5 L of oxygen at home, atrial fibrillation on Xarelto, history of right breast cancer in remission, diabetes presenting with increasing shortness of breath with exertion since yesterday.  She has had a nonproductive cough, denies fevers or chills, denies chest pain.  She has been compliant with her routine medications including her inhalers, however has not used her albuterol nebulizer treatments since yesterday stating she was too short of breath to get to the next room to set it up.  She endorses she is at her baseline with breathing at rest, but any ambulation makes her very winded and sitting at her oxygen levels are dipping into the 70s with any exertion.     The history is provided by the patient.    Past Medical History:  Diagnosis Date  . Asthma   . Atrial flutter ([place])   . Cancer [facility])    Right breast  . COPD (chronic obstructive pulmonary disease) ([place])   . History of breast cancer    right breast  . Hypertension   . On home O2   . Type 2 diabetes mellitus [facility])     Patient Active Problem List   Diagnosis Date Noted  . COPD exacerbation ([place]) 10/18/2018  . Osteoporosis 08/30/2018  . Closed compression fracture of body of L1 vertebra ([place])   . Constipation   . Acute and chronic respiratory failure (acute-on-chronic) ([place]) 08/10/2018  . Left lower lobe pneumonia ([place]) 08/10/2018  . Leukocytosis 08/10/2018  . Chronic respiratory failure with hypoxia ([place]) 06/28/2018  . Acute upper GI bleeding 01/20/2018  . Lower GI bleed 01/20/2018  . Acute respiratory failure with hypoxia ([place]) 09/17/2017  . Diabetes mellitus ([place]) 08/31/2017  . Acute hypoxemic respiratory failure ([place]) 08/31/2017   . Hemorrhoids 10/30/2016  . Atrial fibrillation with RVR ([place]) 09/24/2016  . Atrial flutter ([place]) 09/23/2016  . Obstructive chronic bronchitis with exacerbation ([place]) 05/23/2016  . Vitamin D deficiency 05/23/2016  . Acute on chronic respiratory failure ([place]) 03/24/2015  . Metabolic encephalopathy 19/14/7829  . HCAP (healthcare-associated pneumonia) 03/24/2015  . Anticoagulation management encounter   . New onset atrial flutter ([place]) 03/21/2015  . Obesity 03/21/2015  . Atrial flutter with rapid ventricular response ([place]) 03/21/2015  . COPD (chronic obstructive pulmonary disease) ([place]) 12/20/2014  . COPD with acute exacerbation ([place]) 12/20/2014  . 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 Breast cancer ([place]) 10/11/2009    Past Surgical History:  Procedure Laterality Date  . BREAST SURGERY  2011   Right breast mastectomy  . CARDIOVERSION N/A 06/27/2018   Procedure: CARDIOVERSION;  Surgeon: Arnoldo Lenis, MD;  Location: [facility] ENDO SUITE;  Service: Endoscopy;  Laterality: N/A;  . CESAREAN SECTION    . COLONOSCOPY N/A 01/22/2018   Procedure: COLONOSCOPY;  Surgeon: Rogene Houston, MD;  Location: [facility] ENDO SUITE;  Service: Endoscopy;  Laterality: N/A;  . HERNIA REPAIR     RIH  . IR VERTEBROPLASTY LUMBAR BX INC UNI/BIL INC/INJECT/IMAGING  09/05/2018  . MASTECTOMY  2011   right breast  . TEE WITHOUT CARDIOVERSION N/A 06/27/2018   Procedure: TRANSESOPHAGEAL ECHOCARDIOGRAM (TEE) WITH PROPOFOL;  Surgeon: Arnoldo Lenis,  MD;  Location: [facility] ENDO SUITE;  Service: Endoscopy;  Laterality: N/A;     OB History    Gravida      Para      Term      Preterm      AB      Living  5     SAB      TAB      Ectopic      Multiple      Live Births               Home Medications    Prior to Admission medications   Medication Sig Start Date End Date Taking? Authorizing Provider  acetaminophen (TYLENOL) 500 MG tablet  Take 500 mg by mouth every 8 (eight) hours as needed for mild pain or headache.     Lavella Lemons, PA  albuterol (PROVENTIL) (2.5 MG/3ML) 0.083% nebulizer solution Take 2.5 mg by nebulization every 6 (six) hours as needed for wheezing or shortness of breath.    [provider]  amiodarone (PACERONE) 200 MG tablet Take 1 tablet (200 mg total) by mouth daily. 07/20/18   Strader, Fransisco Hertz, PA-C  docusate sodium (COLACE) 100 MG capsule Take 1 capsule (100 mg total) by mouth 2 (two) times daily. Patient taking differently: Take 100 mg by mouth daily.  08/22/18   Barton Dubois, MD  furosemide (LASIX) 40 MG tablet Take 80 mg by mouth daily.  07/10/18   [provider]  guaiFENesin (MUCINEX) 600 MG 12 hr tablet Take 1 tablet (600 mg total) by mouth 2 (two) times daily. Patient not taking: Reported on 09/26/2018 08/22/18   Barton Dubois, MD  metFORMIN (GLUCOPHAGE-XR) 500 MG 24 hr tablet Take 500 mg by mouth daily with breakfast.  08/10/17   [provider]  metoprolol tartrate (LOPRESSOR) 50 MG tablet Take 50 mg by mouth 2 (two) times daily. 04/12/18   [provider]  OXYGEN Inhale 2.5-3 L into the lungs continuous.     [provider]  polyethylene glycol (MIRALAX / GLYCOLAX) 17 g packet Take 17 g by mouth daily as needed for mild constipation. 08/22/18   Barton Dubois, MD  potassium chloride SA (K-DUR) 20 MEQ tablet Take 20 mEq by mouth 2 (two) times daily.    [provider]  predniSONE (DELTASONE) 10 MG tablet Take 10 mg by mouth daily with breakfast.    [provider]  predniSONE (DELTASONE) 10 MG tablet Take 6 tablets (60 mg total) by mouth daily with breakfast. And decrease by one tablet daily Patient not taking: Reported on 10/02/2018 10/01/18   Orson Eva, MD  PROAIR HFA 108 (90 BASE) MCG/ACT inhaler Inhale 2 puffs into the lungs every 6 (six) hours as needed for wheezing or shortness of breath.  03/06/11   [provider]   rivaroxaban (XARELTO) 20 MG TABS tablet Take 1 tablet (20 mg total) by mouth daily with supper. Stop Xarelto until Monday (08/26/18) in anticipation for kyphoplasty 08/22/18   Barton Dubois, MD  TRELEGY ELLIPTA 100-62.5-25 MCG/INH AEPB Take 1 puff by mouth daily. 08/27/17   [provider]    Family History Family History  Problem Relation Age of Onset  . Cancer Mother        lung  . Diabetes Brother     Social History Social History   Tobacco Use  . Smoking status: Former Smoker    Packs/day: 0.50    Years: 32.00    Pack years:  16.00    Types: Cigarettes    Start date: 12/12/1962    Quit date: 03/13/1994    Years since quitting: 24.6  . Smokeless tobacco: Never Used  Substance Use Topics  . Alcohol use: No    Alcohol/week: 0.0 standard drinks  . Drug use: No     Allergies   Codeine   Review of Systems Review of Systems  Constitutional: Positive for fatigue. Negative for chills and fever.  HENT: Negative for congestion and sore throat.   Eyes: Negative.   Respiratory: Positive for cough, shortness of breath and wheezing. Negative for chest tightness.   Cardiovascular: Negative for chest pain, palpitations and leg swelling.  Gastrointestinal: Negative for abdominal pain, nausea and vomiting.  Genitourinary: Negative.   Musculoskeletal: Negative for arthralgias, joint swelling and neck pain.  Skin: Negative.  Negative for rash and wound.  Neurological: Negative for dizziness, weakness, light-headedness, numbness and headaches.  Psychiatric/Behavioral: Negative.      Physical Exam Updated Vital Signs BP 132/68   Pulse 89   Temp (!) 97.5 F (36.4 C) (Oral)   Resp (!) 23   Ht 5\' 3"  (1.6 m)   Wt 72.6 kg   SpO2 (!) 84%   BMI 28.34 kg/m   Physical Exam Vitals signs and nursing note reviewed.  Constitutional:      Appearance: Normal appearance. She is well-developed.  HENT:     Head: Normocephalic and atraumatic.     Mouth/Throat:     Mouth: Mucous  membranes are moist.  Eyes:     Conjunctiva/sclera: Conjunctivae normal.  Neck:     Musculoskeletal: Normal range of motion.  Cardiovascular:     Rate and Rhythm: Normal rate and regular rhythm.     Pulses:          Radial pulses are 2+ on the right side and 2+ on the left side.       Dorsalis pedis pulses are 2+ on the right side and 2+ on the left side.     Heart sounds: Normal heart sounds.  Pulmonary:     Effort: Tachypnea and accessory muscle usage present.     Breath sounds: Decreased breath sounds present. No wheezing, rhonchi or rales.  Abdominal:     General: Bowel sounds are normal.     Palpations: Abdomen is soft.     Tenderness: There is no abdominal tenderness.  Musculoskeletal: Normal range of motion.        General: No swelling.     Right lower leg: No edema.     Left lower leg: No edema.  Skin:    General: Skin is warm and dry.  Neurological:     Mental Status: She is alert.      ED Treatments / Results  Labs (all labs ordered are listed, but only abnormal results are displayed) Labs Reviewed  BASIC METABOLIC PANEL - Abnormal; Notable for the following components:      Result Value   Chloride 94 (*)    CO2 37 (*)    Glucose, Bld 291 (*)    All other components within normal limits  CBC WITH DIFFERENTIAL/PLATELET - Abnormal; Notable for the following components:   Hemoglobin 11.5 (*)    MCV 101.9 (*)    MCHC 27.4 (*)    Neutro Abs 7.8 (*)    All other components within normal limits  SARS CORONAVIRUS 2 (HOSPITAL ORDER, [facility])  HIV ANTIBODY (ROUTINE TESTING W REFLEX)  LEGIONELLA  PNEUMOPHILA SEROGP 1 UR AG  STREP PNEUMONIAE URINARY ANTIGEN  TROPONIN I (HIGH SENSITIVITY)  TROPONIN I (HIGH SENSITIVITY)    EKG EKG Interpretation  Date/Time:  Friday October 18 2018 18:37:56 EDT Ventricular Rate:  90 PR Interval:    QRS Duration: 107 QT Interval:  402 QTC Calculation: 481 R Axis:   -12 Text Interpretation:  Sinus  rhythm Atrial premature complex Anteroseptal infarct, age indeterminate Lateral leads are also involved Baseline wander in lead(s) V5 Confirmed by Nat Christen [phone]) on 10/18/2018 7:38:03 PM   Radiology Dg Chest Portable 1 View  Result Date: 10/18/2018 CLINICAL DATA:  Shortness of breath. EXAM: PORTABLE CHEST 1 VIEW COMPARISON:  September 26, 2018 FINDINGS: Emphysematous changes identified. Opacity in the right base. Atelectasis in the left base. Stable cardiomegaly. No other changes. IMPRESSION: 1. Opacity in the right base could represent atelectasis or developing infiltrate. Recommend follow-up to resolution. Electronically Signed   By: Dorise Bullion III M.D   On: 10/18/2018 19:42    Procedures Procedures (including critical care time)  Medications Ordered in ED Medications  0.9 %  sodium chloride infusion (has no administration in time range)  albuterol (VENTOLIN HFA) 108 (90 Base) MCG/ACT inhaler 2 puff (2 puffs Inhalation Given 10/18/18 1905)  methylPREDNISolone sodium succinate (SOLU-MEDROL) 125 mg/2 mL injection 125 mg (125 mg Intravenous Given 10/18/18 1906)  levalbuterol (XOPENEX) nebulizer solution 1.25 mg (1.25 mg Nebulization Given 10/18/18 2115)  cefTRIAXone (ROCEPHIN) 1 g in sodium chloride 0.9 % 100 mL IVPB (0 g Intravenous Stopped 10/18/18 2143)     Initial Impression / Assessment and Plan / ED Course  I have reviewed the triage vital signs and the nursing notes.  Pertinent labs & imaging results that were available during my care of the patient were reviewed by me and considered in my medical decision making (see chart for details).        Pt with copd exacerbation. covid negative.  Given albuterol mdi with mild improvement in work of breathing, solumedrol IV.  Once the Covid test negative, was also given xopenex neb tx, after which pt sat's remained in the mid 80's, although not in acute respiratory distress at rest.  Discussed with Dr. Maudie Mercury who will admit pt for COPD exacerbation  flare.  Labs and imaging reviewed, questioned early infiltrate, rocephin started.   Final Clinical Impressions(s) / ED Diagnoses   Final diagnoses:  COPD exacerbation [facility])    ED Discharge Orders    None       Landis Martins 10/18/18 2212    Nat Christen, MD 10/19/18 [phone]

## 2018-10-18 NOTE — ED Triage Notes (Addendum)
Pt sats in 70's with 3 1/2 liter O@. Pt reports SOB since yesterday . Non productive cough. Required 6 Linitially to get up to 90's then decreased to 4 L

## 2018-10-18 NOTE — H&P (Addendum)
TRH H&P    Patient Demographics:    Eileen [name], is a 73 y.o. female  MRN: [id number]  DOB - 09-27-1945  Admit Date - 10/18/2018  Referring MD/NP/PA:  Evalee Jefferson  Outpatient Primary MD for the patient is Lavella Lemons, PA  Patient coming from:  home  Chief complaint-  dyspnea   HPI:    Eileen [name]  is a 73 y.o. female,  Right breast cancer in remission,.L1 compression fracture s/p vertebroplasty, Anemia (iron def), Diverticulosis, Dm2, Aflutter, Moderate mitral regurgitation (06/27/2018),  Copd (severe), chronic respiratory failure on 3.5 L, presents with c/o dyspnea since Monday.  Pt notes slight cough, mostly dry.  Pt denies fever, chills, cp, palp, n/v, abd pain, diarrhea, brbpr, black stool, dysuria, hematuria.   In ED,  Temperature 97.5, pulse 90 blood pressure 132/74, pulse ox 88% on O2 nasal cannula, 3.5 L  Chest x-ray Impression: Opacity in the right base could represent atelectasis or developing infiltrate, recommend follow-up till resolution  Sodium 142, potassium 5.0 BUN 12, creatinine 0.61, HCO3 37 Glucose 291 WBC 9.6, hemoglobin 11.5, platelet count 232  Troponin 9  COVID-19- negative  Pt will be admitted for dyspnea secondary to Copd exacerbation and Hcap     Review of systems:    In addition to the HPI above,  No Fever-chills, No Headache, No changes with Vision or hearing, No problems swallowing food or Liquids, No Chest pain  No Abdominal pain, No Nausea or Vomiting, bowel movements are regular, No Blood in stool or Urine, No dysuria, No new skin rashes or bruises, No new joints pains-aches,  No new weakness, tingling, numbness in any extremity, No recent weight gain or loss, No polyuria, polydypsia or polyphagia, No significant Mental Stressors.  All other systems reviewed and are negative.    Past History of the following :    Past Medical History:   Diagnosis Date  . Asthma   . Atrial flutter ([place])   . Cancer [facility])    Right breast  . COPD (chronic obstructive pulmonary disease) ([place])   . History of breast cancer    right breast  . Hypertension   . On home O2   . Type 2 diabetes mellitus ([place])       Past Surgical History:  Procedure Laterality Date  . BREAST SURGERY  2011   Right breast mastectomy  . CARDIOVERSION N/A 06/27/2018   Procedure: CARDIOVERSION;  Surgeon: Arnoldo Lenis, MD;  [facility];  Service: Endoscopy;  Laterality: N/A;  . CESAREAN SECTION    . COLONOSCOPY N/A 01/22/2018   Procedure: COLONOSCOPY;  Surgeon: Rogene Houston, MD;  Location[facility];  Service: Endoscopy;  Laterality: N/A;  . HERNIA REPAIR     RIH  . IR VERTEBROPLASTY LUMBAR BX INC UNI/BIL INC/INJECT/IMAGING  09/05/2018  . MASTECTOMY  2011   right breast  . TEE WITHOUT CARDIOVERSION N/A 06/27/2018   Procedure: TRANSESOPHAGEAL ECHOCARDIOGRAM (TEE) WITH PROPOFOL;  Surgeon: Arnoldo Lenis, MD;  [facility];  Service: Endoscopy;  Laterality: N/A;      Social History:      Social History   Tobacco Use  . Smoking status: Former Smoker    Packs/day: 0.50    Years: 32.00    Pack years: 16.00    Types: Cigarettes    Start date: 12/12/1962    Quit date: 03/13/1994    Years since quitting: 24.6  . Smokeless tobacco: Never Used  Substance Use Topics  . Alcohol use: No    Alcohol/week: 0.0 standard drinks       Family History :     Family History  Problem Relation Age of Onset  . Cancer Mother        lung  . Diabetes Brother        Home Medications:   Prior to Admission medications   Medication Sig Start Date End Date Taking? Authorizing Provider  acetaminophen (TYLENOL) 500 MG tablet Take 500 mg by mouth every 8 (eight) hours as needed for mild pain or headache.     Lavella Lemons, PA  albuterol (PROVENTIL) (2.5 MG/3ML) 0.083% nebulizer solution Take 2.5 mg by nebulization every 6 (six)  hours as needed for wheezing or shortness of breath.    [provider]  amiodarone (PACERONE) 200 MG tablet Take 1 tablet (200 mg total) by mouth daily. 07/20/18   Strader, Fransisco Hertz, PA-C  docusate sodium (COLACE) 100 MG capsule Take 1 capsule (100 mg total) by mouth 2 (two) times daily. Patient taking differently: Take 100 mg by mouth daily.  08/22/18   Barton Dubois, MD  furosemide (LASIX) 40 MG tablet Take 80 mg by mouth daily.  07/10/18   [provider]  guaiFENesin (MUCINEX) 600 MG 12 hr tablet Take 1 tablet (600 mg total) by mouth 2 (two) times daily. Patient not taking: Reported on 09/26/2018 08/22/18   Barton Dubois, MD  metFORMIN (GLUCOPHAGE-XR) 500 MG 24 hr tablet Take 500 mg by mouth daily with breakfast.  08/10/17   [provider]  metoprolol tartrate (LOPRESSOR) 50 MG tablet Take 50 mg by mouth 2 (two) times daily. 04/12/18   [provider]  OXYGEN Inhale 2.5-3 L into the lungs continuous.     [provider]  polyethylene glycol (MIRALAX / GLYCOLAX) 17 g packet Take 17 g by mouth daily as needed for mild constipation. 08/22/18   Barton Dubois, MD  potassium chloride SA (K-DUR) 20 MEQ tablet Take 20 mEq by mouth 2 (two) times daily.    [provider]  predniSONE (DELTASONE) 10 MG tablet Take 10 mg by mouth daily with breakfast.    [provider]  predniSONE (DELTASONE) 10 MG tablet Take 6 tablets (60 mg total) by mouth daily with breakfast. And decrease by one tablet daily Patient not taking: Reported on 10/02/2018 10/01/18   Orson Eva, MD  PROAIR HFA 108 (90 BASE) MCG/ACT inhaler Inhale 2 puffs into the lungs every 6 (six) hours as needed for wheezing or shortness of breath.  03/06/11   [provider]  rivaroxaban (XARELTO) 20 MG TABS tablet Take 1 tablet (20 mg total) by mouth daily with supper. Stop Xarelto until Monday (08/26/18) in anticipation for kyphoplasty 08/22/18   Barton Dubois, MD  TRELEGY ELLIPTA  100-62.5-25 MCG/INH AEPB Take 1 puff by mouth daily. 08/27/17   [provider]     Allergies:     Allergies  Allergen Reactions  . Codeine Other (See Comments)    "jittery"     Physical Exam:  Vitals  Blood pressure 124/65, pulse 87, temperature (!) 97.5 F (36.4 C), temperature source Oral, resp. rate (!) 23, height 5\' 3"  (1.6 m), weight 72.6 kg, SpO2 (!) 89 %.  1.  General: Alert and oriented x3  2. Psychiatric: Euthymic  3. Neurologic: Cranial nerves II through XII intact, reflexes 2+, symmetric, diffuse with no clonus, motor 5/5 in all 4 extremities  4. HEENMT:  Anicteric, pupils 1.5 mm, symmetric, direct consensual, near intact Neck: No JVD, no bruit  5. Respiratory : Positive crackles at the right lung base, tight, prolonged expiratory phase, bilateral expiratory wheezing  6. Cardiovascular : Regular rate rhythm, S1, S2, 1 / 6 systolic ejection murmur at the apex  7. Gastrointestinal:  Abdomen: Soft obese slightly distended, nontender positive normoactive bowel sounds  8. Skin:  Extremities: No cyanosis clubbing or edema No rash Onychomycosis 9.Musculoskeletal:  Good range of motion, no adenopathy    Data Review:    CBC Recent Labs  Lab 10/18/18 1834  WBC 9.6  HGB 11.5*  HCT 41.9  PLT 232  MCV 101.9*  MCH 28.0  MCHC 27.4*  RDW 13.9  LYMPHSABS 1.3  MONOABS 0.5  EOSABS 0.0  BASOSABS 0.0   ------------------------------------------------------------------------------------------------------------------  Results for orders placed or performed during the hospital encounter of 10/18/18 (from the past 48 hour(s))  Basic metabolic panel     Status: Abnormal   Collection Time: 10/18/18  6:34 PM  Result Value Ref Range   Sodium 142 135 - 145 mmol/L   Potassium 5.0 3.5 - 5.1 mmol/L   Chloride 94 (L) 98 - 111 mmol/L   CO2 37 (H) 22 - 32 mmol/L   Glucose, Bld 291 (H) 70 - 99 mg/dL   BUN 12 8 - 23 mg/dL   Creatinine, Ser 0.61 0.44 -  1.00 mg/dL   Calcium 9.6 8.9 - 10.3 mg/dL   GFR calc non Af Amer >60 >60 mL/min   GFR calc Af Amer >60 >60 mL/min   Anion gap 11 5 - 15    Comment: Performed at [facility], [address], [place], [place] [address]  CBC with Differential     Status: Abnormal   Collection Time: 10/18/18  6:34 PM  Result Value Ref Range   WBC 9.6 4.0 - 10.5 K/uL   RBC 4.11 3.87 - 5.11 MIL/uL   Hemoglobin 11.5 (L) 12.0 - 15.0 g/dL   HCT 41.9 36.0 - 46.0 %   MCV 101.9 (H) 80.0 - 100.0 fL   MCH 28.0 26.0 - 34.0 pg   MCHC 27.4 (L) 30.0 - 36.0 g/dL   RDW 13.9 11.5 - 15.5 %   Platelets 232 150 - 400 K/uL   nRBC 0.0 0.0 - 0.2 %   Neutrophils Relative % 81 %   Neutro Abs 7.8 (H) 1.7 - 7.7 K/uL   Lymphocytes Relative 13 %   Lymphs Abs 1.3 0.7 - 4.0 K/uL   Monocytes Relative 5 %   Monocytes Absolute 0.5 0.1 - 1.0 K/uL   Eosinophils Relative 0 %   Eosinophils Absolute 0.0 0.0 - 0.5 K/uL   Basophils Relative 0 %   Basophils Absolute 0.0 0.0 - 0.1 K/uL   Immature Granulocytes 1 %   Abs Immature Granulocytes 0.07 0.00 - 0.07 K/uL    Comment: Performed at [facility], [address] [place], [place] [address]  Troponin I (High Sensitivity)     Status: None   Collection Time: 10/18/18  6:34 PM  Result Value Ref Range  Troponin I (High Sensitivity) 9 <18 ng/L    Comment: (NOTE) Elevated high sensitivity troponin I (hsTnI) values and significant  changes across serial measurements may suggest ACS but many other  chronic and acute conditions are known to elevate hsTnI results.  Refer to the "Links" section for chest pain algorithms and additional  guidance. Performed at [facility], [address] [place], [place] [address]   SARS Coronavirus 2 [facility] order, Performed in [facility] hospital lab) Nasopharyngeal Nasopharyngeal Swab     Status: None   Collection Time: 10/18/18  7:20 PM   Specimen: Nasopharyngeal Swab  Result Value Ref Range   SARS Coronavirus 2 NEGATIVE NEGATIVE    Comment: (NOTE)  If result is NEGATIVE SARS-CoV-2 target nucleic acids are NOT DETECTED. The SARS-CoV-2 RNA is generally detectable in upper and lower  respiratory specimens during the acute phase of infection. The lowest  concentration of SARS-CoV-2 viral copies this assay can detect is 250  copies / mL. A negative result does not preclude SARS-CoV-2 infection  and should not be used as the sole basis for treatment or other  patient management decisions.  A negative result may occur with  improper specimen collection / handling, submission of specimen other  than nasopharyngeal swab, presence of viral mutation(s) within the  areas targeted by this assay, and inadequate number of viral copies  (<250 copies / mL). A negative result must be combined with clinical  observations, patient history, and epidemiological information. If result is POSITIVE SARS-CoV-2 target nucleic acids are DETECTED. The SARS-CoV-2 RNA is generally detectable in upper and lower  respiratory specimens dur ing the acute phase of infection.  Positive  results are indicative of active infection with SARS-CoV-2.  Clinical  correlation with patient history and other diagnostic information is  necessary to determine patient infection status.  Positive results do  not rule out bacterial infection or co-infection with other viruses. If result is PRESUMPTIVE POSTIVE SARS-CoV-2 nucleic acids MAY BE PRESENT.   A presumptive positive result was obtained on the submitted specimen  and confirmed on repeat testing.  While 2019 novel coronavirus  (SARS-CoV-2) nucleic acids may be present in the submitted sample  additional confirmatory testing may be necessary for epidemiological  and / or clinical management purposes  to differentiate between  SARS-CoV-2 and other Sarbecovirus currently known to infect humans.  If clinically indicated additional testing with an alternate test  methodology [phone]) is advised. The SARS-CoV-2 RNA is generally   detectable in upper and lower respiratory sp ecimens during the acute  phase of infection. The expected result is Negative. Fact Sheet for Patients:  StrictlyIdeas.no Fact Sheet for Healthcare Providers: BankingDealers.co.za This test is not yet approved or cleared by the [place] FDA and has been authorized for detection and/or diagnosis of SARS-CoV-2 by FDA under an Emergency Use Authorization (EUA).  This EUA will remain in effect (meaning this test can be used) for the duration of the COVID-19 declaration under Section 564(b)(1) of the Act, 21 U.S.C. section 360bbb-3(b)(1), unless the authorization is terminated or revoked sooner. Performed at [facility], [address] [place], [place] [address]   Troponin I (High Sensitivity)     Status: None   Collection Time: 10/18/18  8:56 PM  Result Value Ref Range   Troponin I (High Sensitivity) 10 <18 ng/L    Comment: (NOTE) Elevated high sensitivity troponin I (hsTnI) values and significant  changes across serial measurements may suggest ACS but many other  chronic and  acute conditions are known to elevate hsTnI results.  Refer to the "Links" section for chest pain algorithms and additional  guidance. Performed at [facility], [address] [place], [place] [address]     Chemistries  Recent Labs  Lab 10/18/18 1834  NA 142  K 5.0  CL 94*  CO2 37*  GLUCOSE 291*  BUN 12  CREATININE 0.61  CALCIUM 9.6   ------------------------------------------------------------------------------------------------------------------  ------------------------------------------------------------------------------------------------------------------ GFR: Estimated Creatinine Clearance: 59.8 mL/min (by C-G formula based on SCr of 0.61 mg/dL). Liver Function Tests: No results for input(s): AST, ALT, ALKPHOS, BILITOT, PROT, ALBUMIN in the last 168 hours. No results for input(s): LIPASE,  AMYLASE in the last 168 hours. No results for input(s): AMMONIA in the last 168 hours. Coagulation Profile: No results for input(s): INR, PROTIME in the last 168 hours. Cardiac Enzymes: No results for input(s): CKTOTAL, CKMB, CKMBINDEX, TROPONINI in the last 168 hours. BNP (last 3 results) No results for input(s): PROBNP in the last 8760 hours. HbA1C: No results for input(s): HGBA1C in the last 72 hours. CBG: No results for input(s): GLUCAP in the last 168 hours. Lipid Profile: No results for input(s): CHOL, HDL, LDLCALC, TRIG, CHOLHDL, LDLDIRECT in the last 72 hours. Thyroid Function Tests: No results for input(s): TSH, T4TOTAL, FREET4, T3FREE, THYROIDAB in the last 72 hours. Anemia Panel: No results for input(s): VITAMINB12, FOLATE, FERRITIN, TIBC, IRON, RETICCTPCT in the last 72 hours.  --------------------------------------------------------------------------------------------------------------- Urine analysis:    Component Value Date/Time   COLORURINE YELLOW 09/05/2018 0850   APPEARANCEUR CLEAR 09/05/2018 0850   LABSPEC 1.032 (H) 09/05/2018 0850   PHURINE 5.0 09/05/2018 0850   GLUCOSEU >=500 (A) 09/05/2018 0850   HGBUR NEGATIVE 09/05/2018 0850   BILIRUBINUR NEGATIVE 09/05/2018 0850   KETONESUR 5 (A) 09/05/2018 0850   PROTEINUR 100 (A) 09/05/2018 0850   UROBILINOGEN 0.2 09/09/2009 1010   NITRITE NEGATIVE 09/05/2018 0850   LEUKOCYTESUR NEGATIVE 09/05/2018 0850      Imaging Results:    Dg Chest Portable 1 View  Result Date: 10/18/2018 CLINICAL DATA:  Shortness of breath. EXAM: PORTABLE CHEST 1 VIEW COMPARISON:  September 26, 2018 FINDINGS: Emphysematous changes identified. Opacity in the right base. Atelectasis in the left base. Stable cardiomegaly. No other changes. IMPRESSION: 1. Opacity in the right base could represent atelectasis or developing infiltrate. Recommend follow-up to resolution. Electronically Signed   By: Dorise Bullion III M.D   On: 10/18/2018 19:42       Assessment & Plan:    Principal Problem:   Acute respiratory failure with hypoxia ([place]) Active Problems:   HCAP (healthcare-associated pneumonia)   Atrial flutter ([place])   Diabetes mellitus ([place])   COPD exacerbation ([place])   Anemia  Acute respiratory failure with hypoxia Secondary to HCAP, COPD exacerbation  HCAP Blood cultures X2 sets Urine strep antigen Urine Legionella antigen MRSA PCR screen Vanco IV pharmacy to dose, cefepime IV pharmacy to dose Zithromax 500 mg IV daily  COPD exacerbation, Pt is a CO2 retainer, keep o2 sat <96% DC chronic oral prednisone-> Solu-Medrol 80 mg IV 3 times daily Antibiotics as above Continue Trelegy Discontinue albuterol, use levalbuterol nebulizer every 6 hours and every 6 hours as needed   Atrial flutter, moderate MR Continue amiodarone 200 mg p.o. daily Continue Xarelto pharmacy to dose Continue Lopressor 50 mg p.o. twice daily  Edema Continue Lasix 80 mg p.o. daily Continue potassium chloride Check CMP in a.m.  Diabetes type 2 (Hga1c=8.5 on 09/29/2018) Continue metformin Fingerstick blood sugars before meals and  nightly, insulin sliding scale(sensitive)  Anemia, history of diverticulosis Check CBC in a.m.  History of L1 compression fracture status post vertebroplasty Follow-up with PCP, will need regular bone density testing as well as possible anti-resorptive therapy  DVT Prophylaxis-   Xarelto, SCD  AM Labs Ordered, also please review Full Orders  Family Communication: Admission, patients condition and plan of care including tests being ordered have been discussed with the patient and grandson who indicate understanding and agree with the plan and Code Status.  Code Status:  FULL CODE,   Admission status: Inpatient: Based on patients clinical presentation and evaluation of above clinical data, I have made determination that patient meets Inpatient criteria at this time.  Patient has a high risk of clinical deterioration,  patient will require IV antibiotics for H CAP, and IV Solu-Medrol for COPD exacerbation, patient will require greater than 2 night stay, patient will require inpatient admission   Time spent in minutes : 70   Jani Gravel M.D on 10/18/2018 at 11:17 PM

## 2018-10-18 NOTE — Progress Notes (Signed)
ANTICOAGULATION CONSULT NOTE - Initial Consult  Pharmacy Consult for Xarelto (rivaroxaban) Indication: atrial fibrillation/flutter  Allergies  Allergen Reactions  . Codeine Other (See Comments)    "jittery"    Patient Measurements: Height: 5\' 3"  (160 cm) Weight: 160 lb (72.6 kg) IBW/kg (Calculated) : 52.4  Vital Signs: Temp: 97.5 F (36.4 C) (08/07 1759) Temp Source: Oral (08/07 1759) BP: 124/65 (08/07 2300) Pulse Rate: 87 (08/07 2300)  Labs: Recent Labs    10/18/18 1834 10/18/18 2056  HGB 11.5*  --   HCT 41.9  --   PLT 232  --   CREATININE 0.61  --   TROPONINIHS 9 10    Estimated Creatinine Clearance: 59.8 mL/min (by C-G formula based on SCr of 0.61 mg/dL).   Medical History: Past Medical History:  Diagnosis Date  . Asthma   . Atrial flutter ([place])   . Cancer [facility])    Right breast  . COPD (chronic obstructive pulmonary disease) ([place])   . History of breast cancer    right breast  . Hypertension   . On home O2   . Type 2 diabetes mellitus ([facility])      Assessment: 72yoF with history of atrial fibrillation on Xarelto. Pharmacy consulted to dose and monitor Xarelto. Patient taking Xarelto 20 mg daily PTA. Per patient has not taken dose today. Of note patient has a history of upper and lower GI bleed from 01/2018.  Goal of Therapy:  Anticoagulation Monitor platelets by anticoagulation protocol: Yes   Plan:  Continue Xarelto 20 mg PO daily with supper (starting tonight) Monitor renal function and for signs/symptoms of bleeding   Thank you for allowing [place] to participate in this patients care. Jens Som, PharmD  10/18/2018,11:21 PM

## 2018-10-18 NOTE — ED Notes (Signed)
PA made aware of hypoxia

## 2018-10-18 NOTE — Progress Notes (Signed)
Pharmacy Antibiotic Note  Eileen [name] is a 73 y.o. female admitted on 10/18/2018 with pneumonia.  Pharmacy has been consulted for vancomycin and cefepime dosing.  Plan: Vancomycin 1750 mg IV x 1 loading dose, followed by Vancomycin 750 mg IV every 12 hours. Cefepime 2 grams IV every 12 hours Azithromycin 500 mg IV every 24 hours per MD Monitor clinical progress, cultures/sensitivities, renal function, abx plan, Vancomycin levels as indicated.    Height: 5\' 3"  (160 cm) Weight: 160 lb (72.6 kg) IBW/kg (Calculated) : 52.4  Temp (24hrs), Avg:97.5 F (36.4 C), Min:97.5 F (36.4 C), Max:97.5 F (36.4 C)  Recent Labs  Lab 10/18/18 1834  WBC 9.6  CREATININE 0.61    Estimated Creatinine Clearance: 59.8 mL/min (by C-G formula based on SCr of 0.61 mg/dL).    Allergies  Allergen Reactions  . Codeine Other (See Comments)    "jittery"    Antimicrobials this admission: 8/7 ceftriaxone x 1 8/7 azithromycin >>  8/7 cefepime >>  8/7 vancomycin >>  Dose adjustments this admission:  Microbiology results: 8/7 BCx: sent 8/7 COVID: negative  8/7 MRSA PCR: sent  Thank you for allowing pharmacy to be a part of this patient's care.  Jens Som, PharmD 10/18/2018 11:35 PM

## 2018-10-19 DIAGNOSIS — J9621 Acute and chronic respiratory failure with hypoxia: Secondary | ICD-10-CM

## 2018-10-19 DIAGNOSIS — I5032 Chronic diastolic (congestive) heart failure: Secondary | ICD-10-CM

## 2018-10-19 DIAGNOSIS — J471 Bronchiectasis with (acute) exacerbation: Principal | ICD-10-CM

## 2018-10-19 LAB — GLUCOSE, CAPILLARY
Glucose-Capillary: 243 mg/dL — ABNORMAL HIGH (ref 70–99)
Glucose-Capillary: 230 mg/dL — ABNORMAL HIGH (ref 70–99)
Glucose-Capillary: 184 mg/dL — ABNORMAL HIGH (ref 70–99)
Glucose-Capillary: 233 mg/dL — ABNORMAL HIGH (ref 70–99)

## 2018-10-19 LAB — MRSA PCR SCREENING: MRSA by PCR: NEGATIVE

## 2018-10-19 MED ORDER — BUDESONIDE 0.5 MG/2ML IN SUSP
0.5000 mg | Freq: Two times a day (BID) | RESPIRATORY_TRACT | Status: DC
  Administered 2018-10-19 – 2018-10-21 (×5): 0.5 mg via RESPIRATORY_TRACT
  Filled 2018-10-19 (×5): qty 2

## 2018-10-19 MED ORDER — DM-GUAIFENESIN ER 30-600 MG PO TB12
1.0000 | ORAL_TABLET | Freq: Two times a day (BID) | ORAL | Status: DC
  Administered 2018-10-19 – 2018-10-21 (×5): 1 via ORAL
  Filled 2018-10-19 (×5): qty 1

## 2018-10-19 MED ORDER — DOXYCYCLINE HYCLATE 100 MG PO TABS
100.0000 mg | ORAL_TABLET | Freq: Two times a day (BID) | ORAL | Status: DC
  Administered 2018-10-19 – 2018-10-21 (×5): 100 mg via ORAL
  Filled 2018-10-19 (×5): qty 1

## 2018-10-19 MED ORDER — ARFORMOTEROL TARTRATE 15 MCG/2ML IN NEBU
15.0000 ug | INHALATION_SOLUTION | Freq: Two times a day (BID) | RESPIRATORY_TRACT | Status: DC
  Administered 2018-10-19 – 2018-10-21 (×5): 15 ug via RESPIRATORY_TRACT
  Filled 2018-10-19 (×5): qty 2

## 2018-10-19 MED ORDER — FLUTICASONE FUROATE-VILANTEROL 100-25 MCG/INH IN AEPB
1.0000 | INHALATION_SPRAY | Freq: Every day | RESPIRATORY_TRACT | Status: DC
  Filled 2018-10-19: qty 28

## 2018-10-19 MED ORDER — IPRATROPIUM BROMIDE 0.02 % IN SOLN
0.5000 mg | Freq: Four times a day (QID) | RESPIRATORY_TRACT | Status: DC
  Administered 2018-10-19 – 2018-10-21 (×10): 0.5 mg via RESPIRATORY_TRACT
  Filled 2018-10-19 (×10): qty 2.5

## 2018-10-19 MED ORDER — INSULIN DETEMIR 100 UNIT/ML ~~LOC~~ SOLN
12.0000 [IU] | Freq: Every day | SUBCUTANEOUS | Status: DC
  Administered 2018-10-19 – 2018-10-21 (×3): 12 [IU] via SUBCUTANEOUS
  Filled 2018-10-19 (×4): qty 0.12

## 2018-10-19 MED ORDER — LEVALBUTEROL HCL 1.25 MG/0.5ML IN NEBU
1.2500 mg | INHALATION_SOLUTION | Freq: Four times a day (QID) | RESPIRATORY_TRACT | Status: DC
  Administered 2018-10-19 – 2018-10-20 (×4): 1.25 mg via RESPIRATORY_TRACT
  Filled 2018-10-19 (×4): qty 0.5

## 2018-10-19 MED ORDER — ACETAMINOPHEN 325 MG PO TABS
650.0000 mg | ORAL_TABLET | Freq: Four times a day (QID) | ORAL | Status: DC | PRN
  Administered 2018-10-20 (×2): 650 mg via ORAL
  Filled 2018-10-19 (×2): qty 2

## 2018-10-19 MED ORDER — UMECLIDINIUM BROMIDE 62.5 MCG/INH IN AEPB
1.0000 | INHALATION_SPRAY | Freq: Every day | RESPIRATORY_TRACT | Status: DC
  Filled 2018-10-19: qty 7

## 2018-10-19 NOTE — Progress Notes (Signed)
PROGRESS NOTE    [name] Carusone  NTZ:[id number] DOB: January 09, 1946 DOA: 10/18/2018 PCP: Lavella Lemons, PA     Brief Narrative:  73 y.o. female,  Right breast cancer in remission,.L1 compression fracture s/p vertebroplasty, Anemia (iron def), Diverticulosis, Dm2, Aflutter, Moderate mitral regurgitation (06/27/2018),  Copd (severe), chronic respiratory failure on 3.5 L, presents with c/o dyspnea since Monday.  Pt notes slight cough, mostly dry.  Pt denies fever, chills, cp, palp, n/v, abd pain, diarrhea, brbpr, black stool, dysuria, hematuria.  In ED,  Temperature 97.5, pulse 90 blood pressure 132/74, pulse ox 88% on O2 nasal cannula, 3.5 L  Chest x-ray Impression: Opacity in the right base could represent atelectasis or developing infiltrate, recommend follow-up till resolution  Sodium 142, potassium 5.0 BUN 12, creatinine 0.61, HCO3 37 Glucose 291 WBC 9.6, hemoglobin 11.5, platelet count 232. COVID test neg   Assessment & Plan: 1-acute on chronic respiratory failure with hypoxia ([place]) -Patient chronically on 3.5 L nasal cannula supplementation -At this moment requiring 5 L, having expiratory wheezing, feeling short of breath and with difficulty to speak in full sentences. -Continue IV steroids, continue Xopenex, continue Atrovent, start Pulmicort and Brovana -Doxycycline will be provided for treatment of bronchiectasis and concern for pneumonia -Vancomycin has been discontinue in the setting of negative MRSA -Patient is afebrile with normal WBCs -Continue to wean oxygen supplementation to baseline as tolerated. -Patient started on flutter valve and Mucinex.  2-bronchiectasis/pneumonia -As mentioned above we will continue treatment with antibiotics -Given the fact patient is afebrile, with elevation of WBCs and negative MRSA will narrow her antibiotics throughout the use of doxycycline.  3-history of atrial flutter -Rate controlled -Continue Lopressor and amiodarone -Continue  Xarelto  4-type 2 diabetes mellitus -Hyperglycemia in the setting of a steroid usage -Continue sliding scale insulin and continue Levemir -Hold oral hypoglycemic agents while inpatient.  5-chronic diastolic heart failure -Appears to be compensated -Continue current dose of Lasix -Follow daily weights and strict I's and O's -Low-sodium diet has been instructed.   DVT prophylaxis: xarelto  Code Status: Full code Family Communication: No family at bedside. Disposition Plan: Continue nebulizer management, IV steroids, antibiotics (narrowing to doxycycline) and supportive care.  Continue to wean oxygen supplementation as tolerated.  Consultants:   None  Procedures:   See below for x-ray reports  Antimicrobials:  Anti-infectives (From admission, onward)   Start     Dose/Rate Route Frequency Ordered Stop   10/19/18 2200  cefTRIAXone (ROCEPHIN) 1 g in sodium chloride 0.9 % 100 mL IVPB  Status:  Discontinued     1 g 200 mL/hr over 30 Minutes Intravenous Every 24 hours 10/18/18 2219 10/18/18 2301   10/19/18 1400  vancomycin (VANCOCIN) IVPB 750 mg/150 ml premix  Status:  Discontinued     750 mg 150 mL/hr over 60 Minutes Intravenous Every 12 hours 10/18/18 2347 10/19/18 1058   10/19/18 1100  doxycycline (VIBRA-TABS) tablet 100 mg     100 mg Oral Every 12 hours 10/19/18 1058     10/19/18 0200  vancomycin (VANCOCIN) IVPB 750 mg/150 ml premix     750 mg 150 mL/hr over 60 Minutes Intravenous  Once 10/18/18 2345 10/19/18 0456   10/19/18 0100  vancomycin (VANCOCIN) IVPB 1000 mg/200 mL premix     1,000 mg 200 mL/hr over 60 Minutes Intravenous  Once 10/18/18 2345 10/19/18 0222   10/19/18 0030  vancomycin (VANCOCIN) IVPB 1000 mg/200 mL premix  Status:  Discontinued     1,000 mg 200 mL/hr  over 60 Minutes Intravenous  Once 10/18/18 2313 10/18/18 2345   10/18/18 2330  ceFEPIme (MAXIPIME) 2 g in sodium chloride 0.9 % 100 mL IVPB  Status:  Discontinued     2 g 200 mL/hr over 30 Minutes  Intravenous Every 12 hours 10/18/18 2313 10/19/18 1058   10/18/18 2230  azithromycin (ZITHROMAX) 500 mg in sodium chloride 0.9 % 250 mL IVPB  Status:  Discontinued     500 mg 250 mL/hr over 60 Minutes Intravenous Every 24 hours 10/18/18 2219 10/19/18 1058   10/18/18 2115  cefTRIAXone (ROCEPHIN) 1 g in sodium chloride 0.9 % 100 mL IVPB     1 g 200 mL/hr over 30 Minutes Intravenous  Once 10/18/18 2108 10/18/18 2143       Subjective: No fever, still reporting feeling short of breath with activity and having difficulty speaking full sentences.  Patient is also requiring higher levels of oxygen supplementation than at baseline (chronically 3.5 L, currently on 5 L)   Objective: Vitals:   10/19/18 0135 10/19/18 0613 10/19/18 0952 10/19/18 1331  BP:  111/72    Pulse:  73    Resp:  20    Temp:  98.5 F (36.9 C)    TempSrc:  Oral    SpO2: 92% 100% 96% 96%  Weight:      Height:        Intake/Output Summary (Last 24 hours) at 10/19/2018 1404 Last data filed at 10/19/2018 0500 Gross per 24 hour  Intake 1073.75 ml  Output 300 ml  Net 773.75 ml   Filed Weights   10/18/18 1801  Weight: 72.6 kg    Examination: General exam: Alert, awake, oriented x 3; still feeling short of breath and having difficulty speaking in full sentences.  Patient is afebrile.  Reports no chest pain, no nausea, no vomiting. Respiratory system: Positive rhonchi right, positive expiratory wheezing, mild tachypnea.  Using 4-5 L nasal cannula supplementation.  Patient chronically on 3.5 L. Cardiovascular system:RRR. No murmurs, rubs, gallops. Gastrointestinal system: Abdomen is nondistended, soft and nontender. No organomegaly or masses felt. Normal bowel sounds heard. Central nervous system: Alert and oriented. No focal neurological deficits. Extremities: No cyanosis or clubbing.  Skin: No rashes, lesions or ulcers Psychiatry: Judgement and insight appear normal. Mood & affect appropriate.    Data Reviewed: I have  personally reviewed following labs and imaging studies  CBC: Recent Labs  Lab 10/18/18 1834  WBC 9.6  NEUTROABS 7.8*  HGB 11.5*  HCT 41.9  MCV 101.9*  PLT 259   Basic Metabolic Panel: Recent Labs  Lab 10/18/18 1834  NA 142  K 5.0  CL 94*  CO2 37*  GLUCOSE 291*  BUN 12  CREATININE 0.61  CALCIUM 9.6   GFR: Estimated Creatinine Clearance: 59.8 mL/min (by C-G formula based on SCr of 0.61 mg/dL).  CBG: Recent Labs  Lab 10/18/18 2353 10/19/18 0753 10/19/18 1122  GLUCAP 278* 243* 230*   Urine analysis:    Component Value Date/Time   COLORURINE YELLOW 09/05/2018 0850   APPEARANCEUR CLEAR 09/05/2018 0850   LABSPEC 1.032 (H) 09/05/2018 0850   PHURINE 5.0 09/05/2018 0850   GLUCOSEU >=500 (A) 09/05/2018 0850   HGBUR NEGATIVE 09/05/2018 0850   BILIRUBINUR NEGATIVE 09/05/2018 0850   KETONESUR 5 (A) 09/05/2018 0850   PROTEINUR 100 (A) 09/05/2018 0850   UROBILINOGEN 0.2 09/09/2009 1010   NITRITE NEGATIVE 09/05/2018 0850   LEUKOCYTESUR NEGATIVE 09/05/2018 0850    Recent Results (from the past 240 hour(s))  SARS Coronavirus 2 [facility] order, Performed in [facility] hospital lab) Nasopharyngeal Nasopharyngeal Swab     Status: None   Collection Time: 10/18/18  7:20 PM   Specimen: Nasopharyngeal Swab  Result Value Ref Range Status   SARS Coronavirus 2 NEGATIVE NEGATIVE Final    Comment: (NOTE) If result is NEGATIVE SARS-CoV-2 target nucleic acids are NOT DETECTED. The SARS-CoV-2 RNA is generally detectable in upper and lower  respiratory specimens during the acute phase of infection. The lowest  concentration of SARS-CoV-2 viral copies this assay can detect is 250  copies / mL. A negative result does not preclude SARS-CoV-2 infection  and should not be used as the sole basis for treatment or other  patient management decisions.  A negative result may occur with  improper specimen collection / handling, submission of specimen other  than nasopharyngeal swab, presence  of viral mutation(s) within the  areas targeted by this assay, and inadequate number of viral copies  (<250 copies / mL). A negative result must be combined with clinical  observations, patient history, and epidemiological information. If result is POSITIVE SARS-CoV-2 target nucleic acids are DETECTED. The SARS-CoV-2 RNA is generally detectable in upper and lower  respiratory specimens dur ing the acute phase of infection.  Positive  results are indicative of active infection with SARS-CoV-2.  Clinical  correlation with patient history and other diagnostic information is  necessary to determine patient infection status.  Positive results do  not rule out bacterial infection or co-infection with other viruses. If result is PRESUMPTIVE POSTIVE SARS-CoV-2 nucleic acids MAY BE PRESENT.   A presumptive positive result was obtained on the submitted specimen  and confirmed on repeat testing.  While 2019 novel coronavirus  (SARS-CoV-2) nucleic acids may be present in the submitted sample  additional confirmatory testing may be necessary for epidemiological  and / or clinical management purposes  to differentiate between  SARS-CoV-2 and other Sarbecovirus currently known to infect humans.  If clinically indicated additional testing with an alternate test  methodology [phone]) is advised. The SARS-CoV-2 RNA is generally  detectable in upper and lower respiratory sp ecimens during the acute  phase of infection. The expected result is Negative. Fact Sheet for Patients:  StrictlyIdeas.no Fact Sheet for Healthcare Providers: BankingDealers.co.za This test is not yet approved or cleared by the [place] FDA and has been authorized for detection and/or diagnosis of SARS-CoV-2 by FDA under an Emergency Use Authorization (EUA).  This EUA will remain in effect (meaning this test can be used) for the duration of the COVID-19 declaration under Section  564(b)(1) of the Act, 21 U.S.C. section 360bbb-3(b)(1), unless the authorization is terminated or revoked sooner. Performed at [facility], [address] [place] [address], [place], [place] [address]   Culture, blood (Routine X 2) w Reflex to ID Panel     Status: None (Preliminary result)   Collection Time: 10/18/18 11:43 PM   Specimen: Left Antecubital; Blood  Result Value Ref Range Status   Specimen Description LEFT ANTECUBITAL  Final   Special Requests   Final    BOTTLES DRAWN AEROBIC AND ANAEROBIC Blood Culture adequate volume   Culture   Final    NO GROWTH < 12 HOURS Performed at [facility], [address] [place], [place] [address]    Report Status PENDING  Incomplete  Culture, blood (Routine X 2) w Reflex to ID Panel     Status: None (Preliminary result)   Collection Time: 10/19/18 12:00 AM   Specimen: BLOOD LEFT  WRIST  Result Value Ref Range Status   Specimen Description BLOOD LEFT WRIST  Final   Special Requests   Final    BOTTLES DRAWN AEROBIC AND ANAEROBIC Blood Culture adequate volume   Culture   Final    NO GROWTH < 12 HOURS Performed at [facility], [address], [place], [place] [address]    Report Status PENDING  Incomplete  MRSA PCR Screening     Status: None   Collection Time: 10/19/18  6:04 AM   Specimen: Nasal Mucosa; Nasopharyngeal  Result Value Ref Range Status   MRSA by PCR NEGATIVE NEGATIVE Final    Comment:        The GeneXpert MRSA Assay (FDA approved for NASAL specimens only), is one component of a comprehensive MRSA colonization surveillance program. It is not intended to diagnose MRSA infection nor to guide or monitor treatment for MRSA infections. Performed at [facility], [address], [place], [place] [address]      Radiology Studies: Dg Chest Portable 1 View  Result Date: 10/18/2018 CLINICAL DATA:  Shortness of breath. EXAM: PORTABLE CHEST 1 VIEW COMPARISON:  September 26, 2018 FINDINGS: Emphysematous changes identified. Opacity in the right base.  Atelectasis in the left base. Stable cardiomegaly. No other changes. IMPRESSION: 1. Opacity in the right base could represent atelectasis or developing infiltrate. Recommend follow-up to resolution. Electronically Signed   By: Dorise Bullion III M.D   On: 10/18/2018 19:42    Scheduled Meds: . amiodarone  200 mg Oral Daily  . arformoterol  15 mcg Nebulization BID  . budesonide (PULMICORT) nebulizer solution  0.5 mg Nebulization BID  . dextromethorphan-guaiFENesin  1 tablet Oral BID  . docusate sodium  100 mg Oral Daily  . doxycycline  100 mg Oral Q12H  . fluticasone furoate-vilanterol  1 puff Inhalation Daily  . furosemide  80 mg Oral Daily  . insulin aspart  0-5 Units Subcutaneous QHS  . insulin aspart  0-9 Units Subcutaneous TID WC  . insulin detemir  12 Units Subcutaneous Daily  . ipratropium  0.5 mg Nebulization QID  . levalbuterol  1.25 mg Nebulization Q6H WA  . methylPREDNISolone (SOLU-MEDROL) injection  80 mg Intravenous Q8H  . metoprolol tartrate  50 mg Oral BID  . potassium chloride SA  20 mEq Oral BID  . rivaroxaban  20 mg Oral Q supper   Continuous Infusions:   LOS: 1 day    Time spent: 30 minutes.    Barton Dubois, MD [facility] Pager [phone]   10/19/2018, 2:04 PM

## 2018-10-20 LAB — BASIC METABOLIC PANEL
Anion gap: 13 (ref 5–15)
BUN: 21 mg/dL (ref 8–23)
CO2: 38 mmol/L — ABNORMAL HIGH (ref 22–32)
Calcium: 9.5 mg/dL (ref 8.9–10.3)
Chloride: 91 mmol/L — ABNORMAL LOW (ref 98–111)
Creatinine, Ser: 0.74 mg/dL (ref 0.44–1.00)
GFR calc Af Amer: >60 mL/min (ref >60)
GFR calc non Af Amer: >60 mL/min (ref >60)
Glucose, Bld: 238 mg/dL — ABNORMAL HIGH (ref 70–99)
Potassium: 4.7 mmol/L (ref 3.5–5.1)
Sodium: 142 mmol/L (ref 135–145)

## 2018-10-20 LAB — GLUCOSE, CAPILLARY
Glucose-Capillary: 214 mg/dL — ABNORMAL HIGH (ref 70–99)
Glucose-Capillary: 184 mg/dL — ABNORMAL HIGH (ref 70–99)
Glucose-Capillary: 136 mg/dL — ABNORMAL HIGH (ref 70–99)
Glucose-Capillary: 269 mg/dL — ABNORMAL HIGH (ref 70–99)

## 2018-10-20 LAB — CBC
HCT: 38.3 % (ref 36.0–46.0)
Hemoglobin: 11 g/dL — ABNORMAL LOW (ref 12.0–15.0)
MCH: 28 pg (ref 26.0–34.0)
MCHC: 28.7 g/dL — ABNORMAL LOW (ref 30.0–36.0)
MCV: 97.5 fL (ref 80.0–100.0)
Platelets: 259 10*3/uL (ref 150–400)
RBC: 3.93 MIL/uL (ref 3.87–5.11)
RDW: 13.9 % (ref 11.5–15.5)
WBC: 12.1 10*3/uL — ABNORMAL HIGH (ref 4.0–10.5)
nRBC: 0 % (ref 0.0–0.2)

## 2018-10-20 LAB — HIV ANTIBODY (ROUTINE TESTING W REFLEX): HIV Screen 4th Generation wRfx: NONREACTIVE

## 2018-10-20 MED ORDER — LORAZEPAM 2 MG/ML IJ SOLN
1.0000 mg | Freq: Once | INTRAMUSCULAR | Status: AC
  Administered 2018-10-20: 1 mg via INTRAVENOUS
  Filled 2018-10-20: qty 1

## 2018-10-20 MED ORDER — LEVALBUTEROL HCL 1.25 MG/0.5ML IN NEBU
1.2500 mg | INHALATION_SOLUTION | Freq: Four times a day (QID) | RESPIRATORY_TRACT | Status: DC
  Administered 2018-10-20 – 2018-10-21 (×6): 1.25 mg via RESPIRATORY_TRACT
  Filled 2018-10-20 (×7): qty 0.5

## 2018-10-20 NOTE — Progress Notes (Signed)
PROGRESS NOTE    Eileen [name]  JSE:[id number] DOB: 1945-05-04 DOA: 10/18/2018 PCP: Lavella Lemons, PA     Brief Narrative:  73 y.o. female,  Right breast cancer in remission,.L1 compression fracture s/p vertebroplasty, Anemia (iron def), Diverticulosis, Dm2, Aflutter, Moderate mitral regurgitation (06/27/2018),  Copd (severe), chronic respiratory failure on 3.5 L, presents with c/o dyspnea since Monday.  Pt notes slight cough, mostly dry.  Pt denies fever, chills, cp, palp, n/v, abd pain, diarrhea, brbpr, black stool, dysuria, hematuria.  In ED,  Temperature 97.5, pulse 90 blood pressure 132/74, pulse ox 88% on O2 nasal cannula, 3.5 L  Chest x-ray Impression: Opacity in the right base could represent atelectasis or developing infiltrate, recommend follow-up till resolution  Sodium 142, potassium 5.0 BUN 12, creatinine 0.61, HCO3 37 Glucose 291 WBC 9.6, hemoglobin 11.5, platelet count 232. COVID test neg   Assessment & Plan: 1-acute on chronic respiratory failure with hypoxia ([place]) -Patient chronically on 3.5 L nasal cannula supplementation -At this moment requiring 4 L, having expiratory wheezing, feeling short of breath and with mild difficulty speaking in full sentences. -Continue IV steroids, continue Xopenex, continue Atrovent, start Pulmicort and Brovana -continue doxycycline -Vancomycin has been discontinue in the setting of negative MRSA -Patient is afebrile and with normal WBCs -Continue to wean oxygen supplementation to baseline (3-3.5 L) as tolerated. -continue flutter valve and Mucinex.  2-bronchiectasis/pneumonia -As mentioned above we will continue treatment with antibiotics -Given the fact patient is afebrile, with elevation of WBCs and negative MRSA will narrow her antibiotics throughout the use of doxycycline.  3-history of atrial flutter -Rate controlled -Continue Lopressor and amiodarone -Continue Xarelto  4-type 2 diabetes mellitus -Hyperglycemia in  the setting of a steroid usage -Continue sliding scale insulin and continue Levemir -Hold oral hypoglycemic agents while inpatient.  5-chronic diastolic heart failure -Appears to be compensated -Continue current dose of Lasix -Follow daily weights and strict I's and O's -Low-sodium diet has been instructed.   DVT prophylaxis: xarelto  Code Status: Full code Family Communication: No family at bedside. Disposition Plan: Continue nebulizer management, IV steroids for another 24 hours, continue doxycycline and supportive care.  Continue to wean oxygen supplementation as tolerated.  Consultants:   None  Procedures:   See below for x-ray reports  Antimicrobials:  Anti-infectives (From admission, onward)   Start     Dose/Rate Route Frequency Ordered Stop   10/19/18 2200  cefTRIAXone (ROCEPHIN) 1 g in sodium chloride 0.9 % 100 mL IVPB  Status:  Discontinued     1 g 200 mL/hr over 30 Minutes Intravenous Every 24 hours 10/18/18 2219 10/18/18 2301   10/19/18 1400  vancomycin (VANCOCIN) IVPB 750 mg/150 ml premix  Status:  Discontinued     750 mg 150 mL/hr over 60 Minutes Intravenous Every 12 hours 10/18/18 2347 10/19/18 1058   10/19/18 1100  doxycycline (VIBRA-TABS) tablet 100 mg     100 mg Oral Every 12 hours 10/19/18 1058     10/19/18 0200  vancomycin (VANCOCIN) IVPB 750 mg/150 ml premix     750 mg 150 mL/hr over 60 Minutes Intravenous  Once 10/18/18 2345 10/19/18 0456   10/19/18 0100  vancomycin (VANCOCIN) IVPB 1000 mg/200 mL premix     1,000 mg 200 mL/hr over 60 Minutes Intravenous  Once 10/18/18 2345 10/19/18 0222   10/19/18 0030  vancomycin (VANCOCIN) IVPB 1000 mg/200 mL premix  Status:  Discontinued     1,000 mg 200 mL/hr over 60 Minutes Intravenous  Once 10/18/18  2313 10/18/18 2345   10/18/18 2330  ceFEPIme (MAXIPIME) 2 g in sodium chloride 0.9 % 100 mL IVPB  Status:  Discontinued     2 g 200 mL/hr over 30 Minutes Intravenous Every 12 hours 10/18/18 2313 10/19/18 1058    10/18/18 2230  azithromycin (ZITHROMAX) 500 mg in sodium chloride 0.9 % 250 mL IVPB  Status:  Discontinued     500 mg 250 mL/hr over 60 Minutes Intravenous Every 24 hours 10/18/18 2219 10/19/18 1058   10/18/18 2115  cefTRIAXone (ROCEPHIN) 1 g in sodium chloride 0.9 % 100 mL IVPB     1 g 200 mL/hr over 30 Minutes Intravenous  Once 10/18/18 2108 10/18/18 2143       Subjective: No fever, still reporting some shortness of breath on exertion and is having mild difficulty speaking in full sentences.  Intermittent wheezing and productive cough reported.  Objective: Vitals:   10/20/18 0846 10/20/18 1108 10/20/18 1433 10/20/18 1504  BP: (!) 131/121  123/73   Pulse: 86  71   Resp:   17   Temp:   97.7 F (36.5 C)   TempSrc:   Oral   SpO2:  91% 99% 95%  Weight:      Height:       No intake or output data in the 24 hours ending 10/20/18 1555 Filed Weights   10/18/18 1801 10/20/18 0500  Weight: 72.6 kg 72.6 kg    Examination: General exam: Alert, awake, oriented x 3; reports breathing is better today.  Still having mild difficulty speaking in full sentences and experiencing productive cough.  No chest pain, no nausea, no vomiting.  Oxygen supplementation 3.5-4 L. Respiratory system: Fair air movement, positive rhonchi, mild expiratory wheezing at the end of expiratory phase.  Using 3.5-4 L nasal cannula supplementation. Cardiovascular system:RRR. No murmurs, rubs, gallops.  No JVD Gastrointestinal system: Abdomen is nondistended, soft and nontender. No organomegaly or masses felt. Normal bowel sounds heard. Central nervous system: Alert and oriented. No focal neurological deficits. Extremities: No C/C/E, +pedal pulses Skin: No rashes, lesions or ulcers Psychiatry: Judgement and insight appear normal. Mood & affect appropriate.    Data Reviewed: I have personally reviewed following labs and imaging studies  CBC: Recent Labs  Lab 10/18/18 1834 10/20/18 0703  WBC 9.6 12.1*   NEUTROABS 7.8*  --   HGB 11.5* 11.0*  HCT 41.9 38.3  MCV 101.9* 97.5  PLT 232 256   Basic Metabolic Panel: Recent Labs  Lab 10/18/18 1834 10/20/18 0703  NA 142 142  K 5.0 4.7  CL 94* 91*  CO2 37* 38*  GLUCOSE 291* 238*  BUN 12 21  CREATININE 0.61 0.74  CALCIUM 9.6 9.5   GFR: Estimated Creatinine Clearance: 59.8 mL/min (by C-G formula based on SCr of 0.74 mg/dL).  CBG: Recent Labs  Lab 10/19/18 1122 10/19/18 1610 10/19/18 2212 10/20/18 0805 10/20/18 1202  GLUCAP 230* 184* 233* 214* 184*   Urine analysis:    Component Value Date/Time   COLORURINE YELLOW 09/05/2018 0850   APPEARANCEUR CLEAR 09/05/2018 0850   LABSPEC 1.032 (H) 09/05/2018 0850   PHURINE 5.0 09/05/2018 0850   GLUCOSEU >=500 (A) 09/05/2018 0850   HGBUR NEGATIVE 09/05/2018 0850   BILIRUBINUR NEGATIVE 09/05/2018 0850   KETONESUR 5 (A) 09/05/2018 0850   PROTEINUR 100 (A) 09/05/2018 0850   UROBILINOGEN 0.2 09/09/2009 1010   NITRITE NEGATIVE 09/05/2018 0850   LEUKOCYTESUR NEGATIVE 09/05/2018 0850    Recent Results (from the past 240 hour(s))  SARS Coronavirus 2 [facility] order, Performed in [facility] hospital lab) Nasopharyngeal Nasopharyngeal Swab     Status: None   Collection Time: 10/18/18  7:20 PM   Specimen: Nasopharyngeal Swab  Result Value Ref Range Status   SARS Coronavirus 2 NEGATIVE NEGATIVE Final    Comment: (NOTE) If result is NEGATIVE SARS-CoV-2 target nucleic acids are NOT DETECTED. The SARS-CoV-2 RNA is generally detectable in upper and lower  respiratory specimens during the acute phase of infection. The lowest  concentration of SARS-CoV-2 viral copies this assay can detect is 250  copies / mL. A negative result does not preclude SARS-CoV-2 infection  and should not be used as the sole basis for treatment or other  patient management decisions.  A negative result may occur with  improper specimen collection / handling, submission of specimen other  than nasopharyngeal swab,  presence of viral mutation(s) within the  areas targeted by this assay, and inadequate number of viral copies  (<250 copies / mL). A negative result must be combined with clinical  observations, patient history, and epidemiological information. If result is POSITIVE SARS-CoV-2 target nucleic acids are DETECTED. The SARS-CoV-2 RNA is generally detectable in upper and lower  respiratory specimens dur ing the acute phase of infection.  Positive  results are indicative of active infection with SARS-CoV-2.  Clinical  correlation with patient history and other diagnostic information is  necessary to determine patient infection status.  Positive results do  not rule out bacterial infection or co-infection with other viruses. If result is PRESUMPTIVE POSTIVE SARS-CoV-2 nucleic acids MAY BE PRESENT.   A presumptive positive result was obtained on the submitted specimen  and confirmed on repeat testing.  While 2019 novel coronavirus  (SARS-CoV-2) nucleic acids may be present in the submitted sample  additional confirmatory testing may be necessary for epidemiological  and / or clinical management purposes  to differentiate between  SARS-CoV-2 and other Sarbecovirus currently known to infect humans.  If clinically indicated additional testing with an alternate test  methodology [phone]) is advised. The SARS-CoV-2 RNA is generally  detectable in upper and lower respiratory sp ecimens during the acute  phase of infection. The expected result is Negative. Fact Sheet for Patients:  StrictlyIdeas.no Fact Sheet for Healthcare Providers: BankingDealers.co.za This test is not yet approved or cleared by the [place] FDA and has been authorized for detection and/or diagnosis of SARS-CoV-2 by FDA under an Emergency Use Authorization (EUA).  This EUA will remain in effect (meaning this test can be used) for the duration of the COVID-19 declaration under  Section 564(b)(1) of the Act, 21 U.S.C. section 360bbb-3(b)(1), unless the authorization is terminated or revoked sooner. Performed at [facility], [address] [place], [place] [address]   Culture, blood (Routine X 2) w Reflex to ID Panel     Status: None (Preliminary result)   Collection Time: 10/18/18 11:43 PM   Specimen: Left Antecubital; Blood  Result Value Ref Range Status   Specimen Description LEFT ANTECUBITAL  Final   Special Requests   Final    BOTTLES DRAWN AEROBIC AND ANAEROBIC Blood Culture adequate volume   Culture   Final    NO GROWTH 1 DAY Performed at [facility], [address] [place],  [address]    Report Status PENDING  Incomplete  Culture, blood (Routine X 2) w Reflex to ID Panel     Status: None (Preliminary result)   Collection Time: 10/19/18 12:00 AM   Specimen: BLOOD LEFT WRIST  Result Value Ref Range Status   Specimen Description BLOOD LEFT WRIST  Final   Special Requests   Final    BOTTLES DRAWN AEROBIC AND ANAEROBIC Blood Culture adequate volume   Culture   Final    NO GROWTH 1 DAY Performed at [facility], [address] [place], [place] [address]    Report Status PENDING  Incomplete  MRSA PCR Screening     Status: None   Collection Time: 10/19/18  6:04 AM   Specimen: Nasal Mucosa; Nasopharyngeal  Result Value Ref Range Status   MRSA by PCR NEGATIVE NEGATIVE Final    Comment:        The GeneXpert MRSA Assay (FDA approved for NASAL specimens only), is one component of a comprehensive MRSA colonization surveillance program. It is not intended to diagnose MRSA infection nor to guide or monitor treatment for MRSA infections. Performed at [facility], [address] [place], [place] [address]      Radiology Studies: Dg Chest Portable 1 View  Result Date: 10/18/2018 CLINICAL DATA:  Shortness of breath. EXAM: PORTABLE CHEST 1 VIEW COMPARISON:  September 26, 2018 FINDINGS: Emphysematous changes identified. Opacity in the right base.  Atelectasis in the left base. Stable cardiomegaly. No other changes. IMPRESSION: 1. Opacity in the right base could represent atelectasis or developing infiltrate. Recommend follow-up to resolution. Electronically Signed   By: Dorise Bullion III M.D   On: 10/18/2018 19:42    Scheduled Meds: . amiodarone  200 mg Oral Daily  . arformoterol  15 mcg Nebulization BID  . budesonide (PULMICORT) nebulizer solution  0.5 mg Nebulization BID  . dextromethorphan-guaiFENesin  1 tablet Oral BID  . docusate sodium  100 mg Oral Daily  . doxycycline  100 mg Oral Q12H  . fluticasone furoate-vilanterol  1 puff Inhalation Daily  . furosemide  80 mg Oral Daily  . insulin aspart  0-5 Units Subcutaneous QHS  . insulin aspart  0-9 Units Subcutaneous TID WC  . insulin detemir  12 Units Subcutaneous Daily  . ipratropium  0.5 mg Nebulization QID  . levalbuterol  1.25 mg Nebulization QID  . methylPREDNISolone (SOLU-MEDROL) injection  80 mg Intravenous Q8H  . metoprolol tartrate  50 mg Oral BID  . potassium chloride SA  20 mEq Oral BID  . rivaroxaban  20 mg Oral Q supper   Continuous Infusions:   LOS: 2 days    Time spent: 30 minutes.    Barton Dubois, MD [facility] Pager [phone]   10/20/2018, 3:55 PM

## 2018-10-21 DIAGNOSIS — J471 Bronchiectasis with (acute) exacerbation: Secondary | ICD-10-CM

## 2018-10-21 LAB — GLUCOSE, CAPILLARY
Glucose-Capillary: 220 mg/dL — ABNORMAL HIGH (ref 70–99)
Glucose-Capillary: 346 mg/dL — ABNORMAL HIGH (ref 70–99)
Glucose-Capillary: 142 mg/dL — ABNORMAL HIGH (ref 70–99)

## 2018-10-21 MED ORDER — FUROSEMIDE 40 MG PO TABS: 60.0000 mg | ORAL_TABLET | Freq: Every day | ORAL | Status: DC

## 2018-10-21 MED ORDER — PREDNISONE 10 MG PO TABS: 50.0000 mg | ORAL_TABLET | Freq: Every day | ORAL | 0 refills | Status: DC

## 2018-10-21 MED ORDER — DOXYCYCLINE HYCLATE 100 MG PO TABS: 100.0000 mg | ORAL_TABLET | Freq: Two times a day (BID) | ORAL | 0 refills | Status: DC

## 2018-10-21 MED ORDER — PREDNISONE 10 MG PO TABS: 10.0000 mg | ORAL_TABLET | Freq: Every day | ORAL | Status: DC

## 2018-10-21 NOTE — Discharge Summary (Signed)
Physician Discharge Summary  [name] Urick UGQ:[id number] DOB: 17-Mar-1945 DOA: 10/18/2018  PCP: Lavella Lemons, PA  Admit date: 10/18/2018 Discharge date: 10/21/2018  Time spent: 35 minutes  Recommendations for Outpatient Follow-up:  1. Repeat basic metabolic panel to follow electrolytes and renal function 2. Reassess stability and resolution of acute on chronic respiratory failure due to COPD exacerbation. 3. -Continue to follow CBGs with further adjustment on hypoglycemic regimen as needed.   Discharge Diagnoses:  Principal Problem:   Acute respiratory failure with hypoxia ([place]) Active Problems:   HCAP (healthcare-associated pneumonia)   Atrial flutter ([place])   Diabetes mellitus ([place])   COPD exacerbation ([place])   Anemia   Bronchiectasis with acute exacerbation ([place])   Discharge Condition: Stable and improved.  Discharged home with instruction to follow-up with PCP in 10 days.  Diet recommendation: Heart healthy and modify carbohydrates.  Filed Weights   10/18/18 1801 10/20/18 0500 10/21/18 0500  Weight: 72.6 kg 72.6 kg 75.2 kg    History of present illness:  As per H&P written by Dr. Jani Gravel on 10/18/2018 73 y.o.female,Right breast cancer in remission,.L1 compression fracture s/p vertebroplasty, Anemia (iron def), Diverticulosis,Dm2, Aflutter, Moderate mitral regurgitation (06/27/2018), Copd (severe), chronic respiratory failure on 3.5L, presents with c/o dyspnea since Monday. Pt notes slight cough, mostly dry. Pt denies fever, chills, cp, palp, n/v, abd pain, diarrhea, brbpr, black stool, dysuria, hematuria.  In ED, Temperature 97.5, pulse 90 blood pressure 132/74, pulse ox 88% on O2 nasal cannula, 3.5 L  Chest x-ray Impression: Opacity in the right base could represent atelectasis or developing infiltrate, recommend follow-up till resolution  Sodium 142, potassium 5.0 BUN 12, creatinine 0.61, HCO3 37 Glucose 291 WBC 9.6, hemoglobin 11.5, platelet count 232.  COVID test neg  Hospital Course:  1-acute on chronic respiratory failure with hypoxia (HCC) -Improved and is stable at time of discharge.  Patient able to speak in full sentences, without acute wheezing and requiring chronic 3.5 L nasal cannula supplementation.  Will discharge on a steroids tapering, resumption of home inhalers/nebulizer management. -Patient was initially treated with broad-spectrum antibiotics; subsequently narrowed to the use of doxycycline to complete antibiotic therapy; 5 days of antibiotics pending at time of discharge.. -Patient is afebrile with normal WBCs; no using accessory muscles and with normal respiratory effort. -At discharge oxygen supplementation back to patient's baseline 3.5 L through nasal cannula. -Patient instructed to continue using flutter valve and Mucinex.  2-bronchiectasis/pneumonia -As mentioned above will continue treatment with doxycycline to complete antibiotic therapy.   -Continue the use of flutter valve and Mucinex. -Blood cultures negative.  3-history of atrial flutter -Rate controlled and stable -Continue Lopressor and amiodarone -Continue Xarelto for secondary prevention  4-type 2 diabetes mellitus -Hyperglycemia in the setting of steroid usage -Home oral hypoglycemic agent and the use of Levemir -Modified carbohydrate diet has been recommended.  5-chronic diastolic heart failure -Appears to be compensated -Continue current home dose of Lasix -Maintain adequate hydration and to check her weight on daily basis. -Low-sodium diet has been instructed.  Procedures:  See below for x-ray reports.  Consultations:  None  Discharge Exam: Vitals:   10/21/18 0930 10/21/18 1236  BP: (!) 117/46   Pulse: 90   Resp:    Temp:    SpO2:  92%    General:, Afebrile, oriented x3, with significant improvement in her breathing.  Capable of completing sentences while speaking and at this moment back to baseline 3.5 L nasal cannula  supplementation.  No nausea, no vomiting,  no chest pain. Cardiovascular: Rate controlled. S1 and S2.  No rubs, no gallops, no JVD. Respiratory: Improved air movement bilaterally, currently no wheezing; no using accessory muscles.  Positive for scattered rhonchi; normal respiratory effort. Abdomen: Soft, nontender, nondistended, positive bowel sounds Extremities: No cyanosis, no clubbing, no edema on exam.  Discharge Instructions   Discharge Instructions    (HEART FAILURE PATIENTS) Call MD:  Anytime you have any of the following symptoms: 1) 3 pound weight gain in 24 hours or 5 pounds in 1 week 2) shortness of breath, with or without a dry hacking cough 3) swelling in the hands, feet or stomach 4) if you have to sleep on extra pillows at night in order to breathe.   Complete by: As directed    Diet - low sodium heart healthy   Complete by: As directed    Discharge instructions   Complete by: As directed    Take medications as prescribed (special instructions for antibiotics and steroids tapering) Maintain the use of your chronic oxygen supplementation Arrange follow-up with PCP in 10 days Follow heart healthy diet and check weight on daily basis     Allergies as of 10/21/2018      Reactions   Codeine Other (See Comments)   "jittery"      Medication List    TAKE these medications   acetaminophen 500 MG tablet Commonly known as: TYLENOL Take 500 mg by mouth every 8 (eight) hours as needed for mild pain or headache.   amiodarone 200 MG tablet Commonly known as: PACERONE Take 1 tablet (200 mg total) by mouth daily.   docusate sodium 100 MG capsule Commonly known as: Colace Take 1 capsule (100 mg total) by mouth 2 (two) times daily. What changed: when to take this   doxycycline 100 MG tablet Commonly known as: VIBRA-TABS Take 1 tablet (100 mg total) by mouth every 12 (twelve) hours for 5 days.   furosemide 40 MG tablet Commonly known as: LASIX Take 1.5 tablets (60 mg total)  by mouth daily. What changed: how much to take   guaiFENesin 600 MG 12 hr tablet Commonly known as: MUCINEX Take 1 tablet (600 mg total) by mouth 2 (two) times daily.   metFORMIN 500 MG 24 hr tablet Commonly known as: GLUCOPHAGE-XR Take 500 mg by mouth daily with breakfast.   metoprolol tartrate 50 MG tablet Commonly known as: LOPRESSOR Take 50 mg by mouth 2 (two) times daily.   polyethylene glycol 17 g packet Commonly known as: MIRALAX / GLYCOLAX Take 17 g by mouth daily as needed for mild constipation.   potassium chloride SA 20 MEQ tablet Commonly known as: K-DUR Take 20 mEq by mouth 2 (two) times daily.   predniSONE 10 MG tablet Commonly known as: DELTASONE Take 1 tablet (10 mg total) by mouth daily with breakfast. Hold for now and resume when a steroid tapering completed. What changed: additional instructions   predniSONE 10 MG tablet Commonly known as: DELTASONE Take 5 tablets (50 mg total) by mouth daily with breakfast. And decrease by one tablet every 2 days, until down to your chronic 10 mg daily. What changed:   how much to take  additional instructions   albuterol (2.5 MG/3ML) 0.083% nebulizer solution Commonly known as: PROVENTIL Take 2.5 mg by nebulization every 6 (six) hours as needed for wheezing or shortness of breath.   ProAir HFA 108 (90 Base) MCG/ACT inhaler Generic drug: albuterol Inhale 2 puffs into the lungs every 6 (six) hours as  needed for wheezing or shortness of breath.   rivaroxaban 20 MG Tabs tablet Commonly known as: Xarelto Take 1 tablet (20 mg total) by mouth daily with supper. Stop Xarelto until Monday (08/26/18) in anticipation for kyphoplasty   Trelegy Ellipta 100-62.5-25 MCG/INH Aepb Generic drug: Fluticasone-Umeclidin-Vilant Take 1 puff by mouth daily.      Allergies  Allergen Reactions  . Codeine Other (See Comments)    "jittery"   Follow-up Information    Lavella Lemons, [place]. Schedule an appointment as soon as possible  for a visit in 10 day(s).   Specialty: Physician Assistant Contact information: [place] [address] [phone]        Herminio Commons, MD .   Specialty: Cardiology Contact information: [place] [address] [phone]           The results of significant diagnostics from this hospitalization (including imaging, microbiology, ancillary and laboratory) are listed below for reference.    Significant Diagnostic Studies: Ct Angio Chest Pe W Or Wo Contrast  Result Date: 09/27/2018 CLINICAL DATA:  Increased shortness of breath over the last few weeks. History of COPD. Right breast cancer in remission. EXAM: CT ANGIOGRAPHY CHEST WITH CONTRAST TECHNIQUE: Multidetector CT imaging of the chest was performed using the standard protocol during bolus administration of intravenous contrast. Multiplanar CT image reconstructions and MIPs were obtained to evaluate the vascular anatomy. CONTRAST:  28mL OMNIPAQUE IOHEXOL 350 MG/ML SOLN COMPARISON:  Chest radiograph, 09/26/2018. FINDINGS: Cardiovascular: There is satisfactory opacification of the pulmonary arteries to the segmental level. There is no evidence of a pulmonary embolism. Main pulmonary artery is dilated to 3.7 cm. Heart is borderline enlarged. No pericardial effusion. Minor left and right coronary artery calcifications. Aorta is normal in caliber. No dissection. Partly calcified atherosclerotic plaque at the origin of the left subclavian artery. No significant stenosis. Mediastinum/Nodes: No enlarged mediastinal, hilar, or axillary lymph nodes. Thyroid gland, trachea, and esophagus demonstrate no significant findings. Lungs/Pleura: Advanced centrilobular emphysema. Linear and discoid type opacities at the lung bases consistent with atelectasis. No evidence of pneumonia or pulmonary edema. No lung mass or suspicious nodule. No pleural effusion or pneumothorax. Upper Abdomen: No acute findings.  Nonobstructing intrarenal stones. Renal cortical thinning bilaterally low-density renal masses consistent with cysts. Musculoskeletal: Chronic compression fracture of L1 treated with vertebroplasty. No other fractures. No osteoblastic or osteolytic lesions. Previous right mastectomy.  No chest wall masses. Review of the MIP images confirms the above findings. IMPRESSION: 1. No evidence of a pulmonary embolism. 2. No acute findings. 3. Advanced COPD/emphysema.  Mild lung base atelectasis. 4. Enlargement of the main pulmonary artery suggesting pulmonary hypertension. Emphysema (ICD10-J43.9). Electronically Signed   By: Lajean Manes M.D.   On: 09/27/2018 15:36   Dg Chest Portable 1 View  Result Date: 10/18/2018 CLINICAL DATA:  Shortness of breath. EXAM: PORTABLE CHEST 1 VIEW COMPARISON:  September 26, 2018 FINDINGS: Emphysematous changes identified. Opacity in the right base. Atelectasis in the left base. Stable cardiomegaly. No other changes. IMPRESSION: 1. Opacity in the right base could represent atelectasis or developing infiltrate. Recommend follow-up to resolution. Electronically Signed   By: Dorise Bullion III M.D   On: 10/18/2018 19:42   Dg Chest Portable 1 View  Result Date: 09/26/2018 CLINICAL DATA:  Shortness of breath. EXAM: PORTABLE CHEST 1 VIEW COMPARISON:  Radiographs of August 20, 2018. FINDINGS: Stable cardiomediastinal silhouette. No pneumothorax is noted. Mild bibasilar subsegmental atelectasis is noted. Bony thorax is  unremarkable. IMPRESSION: Mild bibasilar subsegmental atelectasis. Electronically Signed   By: Marijo Conception M.D.   On: 09/26/2018 14:13    Microbiology: Recent Results (from the past 240 hour(s))  SARS Coronavirus 2 [facility] order, Performed in [facility] hospital lab) Nasopharyngeal Nasopharyngeal Swab     Status: None   Collection Time: 10/18/18  7:20 PM   Specimen: Nasopharyngeal Swab  Result Value Ref Range Status   SARS Coronavirus 2 NEGATIVE NEGATIVE Final     Comment: (NOTE) If result is NEGATIVE SARS-CoV-2 target nucleic acids are NOT DETECTED. The SARS-CoV-2 RNA is generally detectable in upper and lower  respiratory specimens during the acute phase of infection. The lowest  concentration of SARS-CoV-2 viral copies this assay can detect is 250  copies / mL. A negative result does not preclude SARS-CoV-2 infection  and should not be used as the sole basis for treatment or other  patient management decisions.  A negative result may occur with  improper specimen collection / handling, submission of specimen other  than nasopharyngeal swab, presence of viral mutation(s) within the  areas targeted by this assay, and inadequate number of viral copies  (<250 copies / mL). A negative result must be combined with clinical  observations, patient history, and epidemiological information. If result is POSITIVE SARS-CoV-2 target nucleic acids are DETECTED. The SARS-CoV-2 RNA is generally detectable in upper and lower  respiratory specimens dur ing the acute phase of infection.  Positive  results are indicative of active infection with SARS-CoV-2.  Clinical  correlation with patient history and other diagnostic information is  necessary to determine patient infection status.  Positive results do  not rule out bacterial infection or co-infection with other viruses. If result is PRESUMPTIVE POSTIVE SARS-CoV-2 nucleic acids MAY BE PRESENT.   A presumptive positive result was obtained on the submitted specimen  and confirmed on repeat testing.  While 2019 novel coronavirus  (SARS-CoV-2) nucleic acids may be present in the submitted sample  additional confirmatory testing may be necessary for epidemiological  and / or clinical management purposes  to differentiate between  SARS-CoV-2 and other Sarbecovirus currently known to infect humans.  If clinically indicated additional testing with an alternate test  methodology [phone]) is advised. The SARS-CoV-2  RNA is generally  detectable in upper and lower respiratory sp ecimens during the acute  phase of infection. The expected result is Negative. Fact Sheet for Patients:  StrictlyIdeas.no Fact Sheet for Healthcare Providers: BankingDealers.co.za This test is not yet approved or cleared by the [place] FDA and has been authorized for detection and/or diagnosis of SARS-CoV-2 by FDA under an Emergency Use Authorization (EUA).  This EUA will remain in effect (meaning this test can be used) for the duration of the COVID-19 declaration under Section 564(b)(1) of the Act, 21 U.S.C. section 360bbb-3(b)(1), unless the authorization is terminated or revoked sooner. Performed at [facility], [address], [place], [place] [address]   Culture, blood (Routine X 2) w Reflex to ID Panel     Status: None (Preliminary result)   Collection Time: 10/18/18 11:43 PM   Specimen: Left Antecubital; Blood  Result Value Ref Range Status   Specimen Description LEFT ANTECUBITAL  Final   Special Requests   Final    BOTTLES DRAWN AEROBIC AND ANAEROBIC Blood Culture adequate volume   Culture   Final    NO GROWTH 2 DAYS Performed at [facility] [place], [address], [place], [place] [address]    Report Status PENDING  Incomplete  Culture, blood (Routine X 2) w Reflex to ID Panel     Status: None (Preliminary result)   Collection Time: 10/19/18 12:00 AM   Specimen: BLOOD LEFT WRIST  Result Value Ref Range Status   Specimen Description BLOOD LEFT WRIST  Final   Special Requests   Final    BOTTLES DRAWN AEROBIC AND ANAEROBIC Blood Culture adequate volume   Culture   Final    NO GROWTH 2 DAYS Performed at [facility], [address] [place], [place] [address]    Report Status PENDING  Incomplete  MRSA PCR Screening     Status: None   Collection Time: 10/19/18  6:04 AM   Specimen: Nasal Mucosa; Nasopharyngeal  Result Value Ref Range Status   MRSA by PCR NEGATIVE  NEGATIVE Final    Comment:        The GeneXpert MRSA Assay (FDA approved for NASAL specimens only), is one component of a comprehensive MRSA colonization surveillance program. It is not intended to diagnose MRSA infection nor to guide or monitor treatment for MRSA infections. Performed at [facility], [address] [place], [place] [address]      Labs: Basic Metabolic Panel: Recent Labs  Lab 10/18/18 1834 10/20/18 0703  NA 142 142  K 5.0 4.7  CL 94* 91*  CO2 37* 38*  GLUCOSE 291* 238*  BUN 12 21  CREATININE 0.61 0.74  CALCIUM 9.6 9.5   CBC: Recent Labs  Lab 10/18/18 1834 10/20/18 0703  WBC 9.6 12.1*  NEUTROABS 7.8*  --   HGB 11.5* 11.0*  HCT 41.9 38.3  MCV 101.9* 97.5  PLT 232 259   BNP (last 3 results) Recent Labs    08/10/18 1156 08/20/18 1228 09/26/18 1419  BNP 726.0* 889.0* 302.0*    CBG: Recent Labs  Lab 10/20/18 1605 10/20/18 2221 10/21/18 0815 10/21/18 1255 10/21/18 1612  GLUCAP 136* 269* 220* 346* 142*    Signed:  Barton Dubois MD.  [facility] 10/21/2018, 4:51 PM

## 2018-10-21 NOTE — Progress Notes (Signed)
Nsg Discharge Note  Admit Date:  10/18/2018 Discharge date: 10/21/2018   Eileen [name] to be D/C'd Home per MD order.  AVS completed.  Patient able to verbalize understanding.  Discharge Medication: Allergies as of 10/21/2018      Reactions   Codeine Other (See Comments)   "jittery"      Medication List    TAKE these medications   acetaminophen 500 MG tablet Commonly known as: TYLENOL Take 500 mg by mouth every 8 (eight) hours as needed for mild pain or headache.   amiodarone 200 MG tablet Commonly known as: PACERONE Take 1 tablet (200 mg total) by mouth daily.   docusate sodium 100 MG capsule Commonly known as: Colace Take 1 capsule (100 mg total) by mouth 2 (two) times daily. What changed: when to take this   doxycycline 100 MG tablet Commonly known as: VIBRA-TABS Take 1 tablet (100 mg total) by mouth every 12 (twelve) hours for 5 days.   furosemide 40 MG tablet Commonly known as: LASIX Take 1.5 tablets (60 mg total) by mouth daily. What changed: how much to take   guaiFENesin 600 MG 12 hr tablet Commonly known as: MUCINEX Take 1 tablet (600 mg total) by mouth 2 (two) times daily.   metFORMIN 500 MG 24 hr tablet Commonly known as: GLUCOPHAGE-XR Take 500 mg by mouth daily with breakfast.   metoprolol tartrate 50 MG tablet Commonly known as: LOPRESSOR Take 50 mg by mouth 2 (two) times daily.   polyethylene glycol 17 g packet Commonly known as: MIRALAX / GLYCOLAX Take 17 g by mouth daily as needed for mild constipation.   potassium chloride SA 20 MEQ tablet Commonly known as: K-DUR Take 20 mEq by mouth 2 (two) times daily.   predniSONE 10 MG tablet Commonly known as: DELTASONE Take 1 tablet (10 mg total) by mouth daily with breakfast. Hold for now and resume when a steroid tapering completed. What changed: additional instructions   predniSONE 10 MG tablet Commonly known as: DELTASONE Take 5 tablets (50 mg total) by mouth daily with breakfast. And  decrease by one tablet every 2 days, until down to your chronic 10 mg daily. What changed:   how much to take  additional instructions   albuterol (2.5 MG/3ML) 0.083% nebulizer solution Commonly known as: PROVENTIL Take 2.5 mg by nebulization every 6 (six) hours as needed for wheezing or shortness of breath.   ProAir HFA 108 (90 Base) MCG/ACT inhaler Generic drug: albuterol Inhale 2 puffs into the lungs every 6 (six) hours as needed for wheezing or shortness of breath.   rivaroxaban 20 MG Tabs tablet Commonly known as: Xarelto Take 1 tablet (20 mg total) by mouth daily with supper. Stop Xarelto until Monday (08/26/18) in anticipation for kyphoplasty   Trelegy Ellipta 100-62.5-25 MCG/INH Aepb Generic drug: Fluticasone-Umeclidin-Vilant Take 1 puff by mouth daily.       Discharge Assessment: Vitals:   10/21/18 0930 10/21/18 1236  BP: (!) 117/46   Pulse: 90   Resp:    Temp:    SpO2:  92%   Skin clean, dry and intact without evidence of skin break down, no evidence of skin tears noted. IV catheter discontinued intact. Site without signs and symptoms of complications - no redness or edema noted at insertion site, patient denies c/o pain - only slight tenderness at site.  Dressing with slight pressure applied.  D/c Instructions-Education: Discharge instructions given to patient with verbalized understanding. D/c education completed with patient including follow up  instructions, medication list, d/c activities limitations if indicated, with other d/c instructions as indicated by MD - patient able to verbalize understanding, all questions fully answered. Patient instructed to return to ED, call 911, or call MD for any changes in condition.  Patient escorted via [place], and D/C home via private auto.  Mixtli Reno Loletha Grayer, RN 10/21/2018 4:59 PM

## 2018-10-21 NOTE — Progress Notes (Signed)
Patient having periods of confusion.  Patient talking to people not in room.  Patient confused as to where she is. Patient pulling on lines/tubes.  MD notified.

## 2018-10-21 NOTE — Care Management Important Message (Signed)
Important Message  Patient Details  Name: Eileen [name] MRN: [id number] Date of Birth: 1945-12-14   Medicare Important Message Given:  Yes     [name] 10/21/2018, 2:37 PM

## 2018-10-22 ENCOUNTER — Other Ambulatory Visit: Payer: Self-pay | Admitting: *Deleted

## 2018-10-22 NOTE — Patient Outreach (Signed)
Outreach call to pt for post hospital follow up, Primary care MD completes transition of care, pt hospitalized 8/7-8/10/20 for COPD exacerbation, spoke with pt, HIPAA verified, pt reports she "feels better", pt reports CBG fasting today 188, pt states she is weighing daily and will start to record with tomorrow's reading, pt states she cannot remember what weight was today but thinks in 160's range. Pt reports " back pain much better"  Rates 2,  Pt states she did telephone visit with primary care on 10/15/18, pt states she still does not have xarelto and trelegy inhaler and not sure if she has talked with anyone (pharmacist) from [place], initial referral sent to [facility] pharmacist on 09/12/18 and they sent to [place].  RN CM to check into this for pt and call pt back. RN CM called [facility] and spoke with pharmacist Darryl who reports he will check to see if pt qualifies for patient assistance and in the mean time, he can provide samples for both medications and pt will need to pickup from their office, Darryl states he will call pt and let her know this. RN CM called pt and informed her there will be samples to pickup, pt states she will have family member to pickup,  RN CM informed pt that Darryl from [facility] will be contacting her.  RN CM ask pt to call if she has any further issues with medications or needs assistance with anything else.   [facility] CM Care Plan Problem One     Most Recent Value  Care Plan Problem One  Knowledge deficit related to COPD  Role Documenting the Problem One  Care Management Coordinator  Care Plan for Problem One  Active  [facility] Long Term Goal   Pt will verbalize improved self care for COPD within 60 days.  [facility] Long Term Goal Start Date  08/23/18 Barrie Folk re-established]  Interventions for Problem One Long Term Goal  RN CM reinforced plan of care, COPD action plan  [facility] CM Short Term Goal #1   Pt will verbalize COPD action plan/ zones within 30 days  [facility] CM Short Term Goal #1 Start  Date  10/14/18 [goal re-established]  Interventions for Short Term Goal #1  RN CM reinforced COPD action plan, pt states she is in green zone  [facility] CM Short Term Goal #2   Pt will verbalize signs/ symptoms exacerbation of A-Fib/ A-flutter within 30 days.  [facility] CM Short Term Goal #2 Start Date  07/22/18 [goal re-established]  [facility] CM Short Term Goal #2 Met Date  08/23/18      PLAN Outreach pt in 3 weeks for telephone assessment  Jacqlyn Larsen [facility], [place] Coordinator [phone]

## 2018-10-23 ENCOUNTER — Other Ambulatory Visit: Payer: Self-pay

## 2018-10-23 ENCOUNTER — Encounter (HOSPITAL_COMMUNITY): Payer: Self-pay | Admitting: Emergency Medicine

## 2018-10-23 ENCOUNTER — Inpatient Hospital Stay (HOSPITAL_COMMUNITY)
Admission: EM | Admit: 2018-10-23 | Discharge: 2018-10-28 | DRG: 190 | Disposition: A | Discharge status: Home Health | Payer: Medicare Other | Attending: Internal Medicine | Admitting: Internal Medicine

## 2018-10-23 ENCOUNTER — Emergency Department (HOSPITAL_COMMUNITY): Payer: Medicare Other

## 2018-10-23 DIAGNOSIS — D649 Anemia, unspecified: Secondary | ICD-10-CM | POA: Diagnosis present

## 2018-10-23 DIAGNOSIS — Z853 Personal history of malignant neoplasm of breast: Secondary | ICD-10-CM

## 2018-10-23 DIAGNOSIS — Z7952 Long term (current) use of systemic steroids: Secondary | ICD-10-CM | POA: Diagnosis not present

## 2018-10-23 DIAGNOSIS — I4892 Unspecified atrial flutter: Secondary | ICD-10-CM | POA: Diagnosis not present

## 2018-10-23 DIAGNOSIS — G9340 Encephalopathy, unspecified: Secondary | ICD-10-CM | POA: Diagnosis not present

## 2018-10-23 DIAGNOSIS — Z7901 Long term (current) use of anticoagulants: Secondary | ICD-10-CM | POA: Diagnosis not present

## 2018-10-23 DIAGNOSIS — Z79899 Other long term (current) drug therapy: Secondary | ICD-10-CM

## 2018-10-23 DIAGNOSIS — J441 Chronic obstructive pulmonary disease with (acute) exacerbation: Secondary | ICD-10-CM | POA: Diagnosis not present

## 2018-10-23 DIAGNOSIS — Z9981 Dependence on supplemental oxygen: Secondary | ICD-10-CM

## 2018-10-23 DIAGNOSIS — R000 Tachycardia, unspecified: Secondary | ICD-10-CM | POA: Diagnosis not present

## 2018-10-23 DIAGNOSIS — E1169 Type 2 diabetes mellitus with other specified complication: Secondary | ICD-10-CM | POA: Diagnosis not present

## 2018-10-23 DIAGNOSIS — E1165 Type 2 diabetes mellitus with hyperglycemia: Secondary | ICD-10-CM | POA: Diagnosis not present

## 2018-10-23 DIAGNOSIS — E875 Hyperkalemia: Secondary | ICD-10-CM | POA: Diagnosis present

## 2018-10-23 DIAGNOSIS — J9621 Acute and chronic respiratory failure with hypoxia: Secondary | ICD-10-CM | POA: Diagnosis present

## 2018-10-23 DIAGNOSIS — T380X5A Adverse effect of glucocorticoids and synthetic analogues, initial encounter: Secondary | ICD-10-CM | POA: Diagnosis present

## 2018-10-23 DIAGNOSIS — Z7984 Long term (current) use of oral hypoglycemic drugs: Secondary | ICD-10-CM | POA: Diagnosis not present

## 2018-10-23 DIAGNOSIS — E119 Type 2 diabetes mellitus without complications: Secondary | ICD-10-CM

## 2018-10-23 DIAGNOSIS — J189 Pneumonia, unspecified organism: Secondary | ICD-10-CM | POA: Diagnosis present

## 2018-10-23 DIAGNOSIS — Y95 Nosocomial condition: Secondary | ICD-10-CM | POA: Diagnosis present

## 2018-10-23 DIAGNOSIS — Z9011 Acquired absence of right breast and nipple: Secondary | ICD-10-CM | POA: Diagnosis not present

## 2018-10-23 DIAGNOSIS — I10 Essential (primary) hypertension: Secondary | ICD-10-CM | POA: Diagnosis not present

## 2018-10-23 DIAGNOSIS — I4891 Unspecified atrial fibrillation: Secondary | ICD-10-CM | POA: Diagnosis not present

## 2018-10-23 DIAGNOSIS — R0602 Shortness of breath: Secondary | ICD-10-CM | POA: Diagnosis not present

## 2018-10-23 DIAGNOSIS — R079 Chest pain, unspecified: Secondary | ICD-10-CM

## 2018-10-23 DIAGNOSIS — J440 Chronic obstructive pulmonary disease with acute lower respiratory infection: Secondary | ICD-10-CM | POA: Diagnosis not present

## 2018-10-23 DIAGNOSIS — Z87891 Personal history of nicotine dependence: Secondary | ICD-10-CM | POA: Diagnosis not present

## 2018-10-23 DIAGNOSIS — Z20828 Contact with and (suspected) exposure to other viral communicable diseases: Secondary | ICD-10-CM | POA: Diagnosis present

## 2018-10-23 DIAGNOSIS — Z833 Family history of diabetes mellitus: Secondary | ICD-10-CM | POA: Diagnosis not present

## 2018-10-23 MED ORDER — ACETAMINOPHEN 325 MG PO TABS
650.0000 mg | ORAL_TABLET | Freq: Once | ORAL | Status: AC
  Administered 2018-10-24: 650 mg via ORAL
  Filled 2018-10-23: qty 2

## 2018-10-23 NOTE — ED Triage Notes (Signed)
Patient states she was discharged yesterday from here for COPD exacerbation. States she is no better and still complaining of shortness of breath and weakness.

## 2018-10-23 NOTE — ED Provider Notes (Addendum)
[facility] EMERGENCY DEPARTMENT Provider Note   CSN: [id number] Arrival date & time: 10/23/18  2152    History   Chief Complaint Chief Complaint  Patient presents with   Shortness of Breath    HPI Eileen [name] is a 73 y.o. female.     Patient presents with complaints of shortness of breath.  Patient does have history of COPD.  She was admitted to the hospital this week, discharged yesterday.  Reports that she felt better in the hospital, however after getting home her breathing has worsened.  She feels very fatigued and her breathing is labored.  She has been taking the medications as prescribed at discharge but they seem to be making her worse.  She has had a cough that is productive of thick sputum.  She has not had a fever since discharge.     Past Medical History:  Diagnosis Date   Asthma    Atrial flutter ([place])    Cancer ([place])    Right breast   COPD (chronic obstructive pulmonary disease) ([place])    History of breast cancer    right breast   Hypertension    On home O2    Type 2 diabetes mellitus ([place])     Patient Active Problem List   Diagnosis Date Noted   Bronchiectasis with acute exacerbation ([place])    COPD exacerbation ([place]) 10/18/2018   Anemia 10/18/2018   Osteoporosis 08/30/2018   Closed compression fracture of body of L1 vertebra (HCC)    Constipation    Acute and chronic respiratory failure (acute-on-chronic) ([place]) 08/10/2018   Left lower lobe pneumonia ([place]) 08/10/2018   Leukocytosis 08/10/2018   Chronic respiratory failure with hypoxia ([place]) 06/28/2018   Acute upper GI bleeding 01/20/2018   Lower GI bleed 01/20/2018   Acute respiratory failure with hypoxia ([place]) 09/17/2017   Diabetes mellitus ([place]) 08/31/2017   Acute hypoxemic respiratory failure ([place]) 08/31/2017   Hemorrhoids 10/30/2016   Atrial fibrillation with RVR ([place]) 09/24/2016   Atrial flutter ([place]) 09/23/2016   Obstructive chronic bronchitis with  exacerbation ([place]) 05/23/2016   Vitamin D deficiency 05/23/2016   Acute on chronic respiratory failure ([place]) 43/15/4008   Metabolic encephalopathy 67/61/9509   HCAP (healthcare-associated pneumonia) 03/24/2015   Anticoagulation management encounter    New onset atrial flutter ([place]) 03/21/2015   Obesity 03/21/2015   Atrial flutter with rapid ventricular response ([place]) 03/21/2015   COPD (chronic obstructive pulmonary disease) ([place]) 12/20/2014   COPD with acute exacerbation ([place]) 12/20/2014   DCIS (ductal carcinoma in situ) of breast, right, 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, Arimadex 03/10/2011   Breast cancer ([place]) 10/11/2009    Past Surgical History:  Procedure Laterality Date   BREAST SURGERY  2011   Right breast mastectomy   CARDIOVERSION N/A 06/27/2018   Procedure: CARDIOVERSION;  Surgeon: Arnoldo Lenis, MD;  Location: [facility] END[facility];  Service: Endoscopy;  Laterality: N/A;   CESAREAN SECTION     COLONOSCOPY N/A 01/22/2018   Procedure: COLONOSCOPY;  Surgeon: Rogene Houston, MD;  Location: [facility] ENDO SU[facility];  Service: Endoscopy;  Laterality: N/A;   HERNIA REPAIR     RIH   IR VERTEBROPLASTY LUMBAR BX INC UNI/BIL INC/INJECT/IMAGING  09/05/2018   MASTECTOMY  2011   right breast   TEE WITHOUT CARDIOVERSION N/A 06/27/2018   Procedure: TRANSESOPHAGEAL ECHOCARDIOGRAM (TEE) WITH PROPOFOL;  Surgeon: Arnoldo Lenis, MD;  Location: [facility] END[facility];  Service: Endoscopy;  Laterality: N/A;     OB History    Gravida  Para      Term      Preterm      AB      Living  5     SAB      TAB      Ectopic      Multiple      Live Births               Home Medications    Prior to Admission medications   Medication Sig Start Date End Date Taking? Authorizing Provider  acetaminophen (TYLENOL) 500 MG tablet Take 500 mg by mouth every 8 (eight) hours as needed for mild pain or headache.    Yes Lavella Lemons, PA  albuterol (PROVENTIL) (2.5 MG/3ML) 0.083%  nebulizer solution Take 2.5 mg by nebulization every 6 (six) hours as needed for wheezing or shortness of breath.   Yes [provider]  amiodarone (PACERONE) 200 MG tablet Take 1 tablet (200 mg total) by mouth daily. 07/20/18  Yes Strader, [place] M, PA-C  docusate sodium (COLACE) 100 MG capsule Take 1 capsule (100 mg total) by mouth 2 (two) times daily. Patient taking differently: Take 100 mg by mouth daily.  08/22/18  Yes Barton Dubois, MD  furosemide (LASIX) 40 MG tablet Take 1.5 tablets (60 mg total) by mouth daily. Patient taking differently: Take 60 mg by mouth every other day.  10/21/18  Yes Barton Dubois, MD  guaiFENesin (MUCINEX) 600 MG 12 hr tablet Take 1 tablet (600 mg total) by mouth 2 (two) times daily. 08/22/18  Yes Barton Dubois, MD  metFORMIN (GLUCOPHAGE-XR) 500 MG 24 hr tablet Take 500 mg by mouth daily with breakfast.  08/10/17  Yes [provider]  metoprolol tartrate (LOPRESSOR) 50 MG tablet Take 50 mg by mouth 2 (two) times daily. 04/12/18  Yes [provider]  potassium chloride SA (K-DUR) 20 MEQ tablet Take 20 mEq by mouth 2 (two) times daily.   Yes [provider]  PROAIR HFA 108 (90 BASE) MCG/ACT inhaler Inhale 2 puffs into the lungs every 6 (six) hours as needed for wheezing or shortness of breath.  03/06/11  Yes [provider]  rivaroxaban (XARELTO) 20 MG TABS tablet Take 1 tablet (20 mg total) by mouth daily with supper. Stop Xarelto until Monday (08/26/18) in anticipation for kyphoplasty 08/22/18  Yes Barton Dubois, MD  TRELEGY ELLIPTA 100-62.5-25 MCG/INH AEPB Take 1 puff by mouth daily. 08/27/17  Yes [provider]  amoxicillin-clavulanate (AUGMENTIN) 875-125 MG tablet Take 1 tablet by mouth every 12 (twelve) hours for 3 days. 10/28/18 10/31/18  Manuella Ghazi, Pratik D, DO  polyethylene glycol (MIRALAX / GLYCOLAX) 17 g packet Take 17 g by mouth daily as needed for mild constipation. Patient not taking: Reported on 10/24/2018 08/22/18    Barton Dubois, MD  QUEtiapine (SEROQUEL) 25 MG tablet Take 1 tablet (25 mg total) by mouth at bedtime. 10/28/18 11/27/18  Heath Lark D, DO    Family History Family History  Problem Relation Age of Onset   Cancer Mother        lung   Diabetes Brother     Social History Social History   Tobacco Use   Smoking status: Former Smoker    Packs/day: 0.50    Years: 32.00    Pack years: 16.00    Types: Cigarettes    Start date: 12/12/1962    Quit date: 03/13/1994    Years since quitting: 24.6   Smokeless tobacco: Never Used  Substance Use Topics  Alcohol use: No    Alcohol/week: 0.0 standard drinks   Drug use: No     Allergies   Codeine   Review of Systems Review of Systems  Respiratory: Positive for cough and shortness of breath.   All other systems reviewed and are negative.    Physical Exam Updated Vital Signs BP (!) 150/74 (BP Location: Left Arm)    Pulse 84    Temp (!) 97.2 F (36.2 C) (Oral)    Resp 18    Ht 5\' 2"  (1.575 m)    Wt 74.4 kg    SpO2 91%    BMI 30.00 kg/m   Physical Exam Vitals signs and nursing note reviewed.  Constitutional:      General: She is not in acute distress.    Appearance: Normal appearance. She is well-developed.  HENT:     Head: Normocephalic and atraumatic.     Right Ear: Hearing normal.     Left Ear: Hearing normal.     Nose: Nose normal.  Eyes:     Conjunctiva/sclera: Conjunctivae normal.     Pupils: Pupils are equal, round, and reactive to light.  Neck:     Musculoskeletal: Normal range of motion and neck supple.  Cardiovascular:     Rate and Rhythm: Regular rhythm.     Heart sounds: S1 normal and S2 normal. No murmur. No friction rub. No gallop.   Pulmonary:     Effort: Pulmonary effort is normal. Tachypnea present.     Breath sounds: Decreased breath sounds and wheezing present.  Chest:     Chest wall: No tenderness.  Abdominal:     General: Bowel sounds are normal.     Palpations: Abdomen is soft.      Tenderness: There is no abdominal tenderness. There is no guarding or rebound. Negative signs include Murphy's sign and McBurney's sign.     Hernia: No hernia is present.  Musculoskeletal: Normal range of motion.  Skin:    General: Skin is warm and dry.     Findings: No rash.  Neurological:     Mental Status: She is alert and oriented to person, place, and time.     GCS: GCS eye subscore is 4. GCS verbal subscore is 5. GCS motor subscore is 6.     Cranial Nerves: No cranial nerve deficit.     Sensory: No sensory deficit.     Coordination: Coordination normal.  Psychiatric:        Speech: Speech normal.        Behavior: Behavior normal.        Thought Content: Thought content normal.      ED Treatments / Results  Labs (all labs ordered are listed, but only abnormal results are displayed) Labs Reviewed  CBC WITH DIFFERENTIAL/PLATELET - Abnormal; Notable for the following components:      Result Value   WBC 11.5 (*)    Hemoglobin 11.1 (*)    MCV 100.2 (*)    MCHC 27.5 (*)    Neutro Abs 9.0 (*)    Monocytes Absolute 1.2 (*)    All other components within normal limits  BASIC METABOLIC PANEL - Abnormal; Notable for the following components:   Chloride 93 (*)    CO2 41 (*)    Glucose, Bld 201 (*)    All other components within normal limits  BRAIN NATRIURETIC PEPTIDE - Abnormal; Notable for the following components:   B Natriuretic Peptide 318.0 (*)    All other components  within normal limits  COMPREHENSIVE METABOLIC PANEL - Abnormal; Notable for the following components:   Chloride 92 (*)    CO2 39 (*)    Glucose, Bld 184 (*)    All other components within normal limits  CBC - Abnormal; Notable for the following components:   Hemoglobin 11.1 (*)    MCV 101.3 (*)    MCHC 27.7 (*)    All other components within normal limits  GLUCOSE, CAPILLARY - Abnormal; Notable for the following components:   Glucose-Capillary 159 (*)    All other components within normal limits    GLUCOSE, CAPILLARY - Abnormal; Notable for the following components:   Glucose-Capillary 165 (*)    All other components within normal limits  GLUCOSE, CAPILLARY - Abnormal; Notable for the following components:   Glucose-Capillary 277 (*)    All other components within normal limits  GLUCOSE, CAPILLARY - Abnormal; Notable for the following components:   Glucose-Capillary 298 (*)    All other components within normal limits  CBC - Abnormal; Notable for the following components:   WBC 11.7 (*)    RBC 3.84 (*)    Hemoglobin 10.9 (*)    MCHC 28.6 (*)    All other components within normal limits  BASIC METABOLIC PANEL - Abnormal; Notable for the following components:   Chloride 91 (*)    CO2 36 (*)    Glucose, Bld 299 (*)    All other components within normal limits  GLUCOSE, CAPILLARY - Abnormal; Notable for the following components:   Glucose-Capillary 263 (*)    All other components within normal limits  GLUCOSE, CAPILLARY - Abnormal; Notable for the following components:   Glucose-Capillary 261 (*)    All other components within normal limits  GLUCOSE, CAPILLARY - Abnormal; Notable for the following components:   Glucose-Capillary 283 (*)    All other components within normal limits  GLUCOSE, CAPILLARY - Abnormal; Notable for the following components:   Glucose-Capillary 125 (*)    All other components within normal limits  BASIC METABOLIC PANEL - Abnormal; Notable for the following components:   Chloride 91 (*)    CO2 35 (*)    Glucose, Bld 233 (*)    All other components within normal limits  GLUCOSE, CAPILLARY - Abnormal; Notable for the following components:   Glucose-Capillary 242 (*)    All other components within normal limits  GLUCOSE, CAPILLARY - Abnormal; Notable for the following components:   Glucose-Capillary 208 (*)    All other components within normal limits  URINALYSIS, ROUTINE W REFLEX MICROSCOPIC - Abnormal; Notable for the following components:    APPearance HAZY (*)    Glucose, UA 150 (*)    Protein, ur 100 (*)    Bacteria, UA MANY (*)    Non Squamous Epithelial 0-5 (*)    All other components within normal limits  GLUCOSE, CAPILLARY - Abnormal; Notable for the following components:   Glucose-Capillary 193 (*)    All other components within normal limits  GLUCOSE, CAPILLARY - Abnormal; Notable for the following components:   Glucose-Capillary 157 (*)    All other components within normal limits  BASIC METABOLIC PANEL - Abnormal; Notable for the following components:   Chloride 94 (*)    CO2 37 (*)    Glucose, Bld 111 (*)    All other components within normal limits  CBC - Abnormal; Notable for the following components:   WBC 13.6 (*)    MCHC 28.6 (*)  All other components within normal limits  GLUCOSE, CAPILLARY - Abnormal; Notable for the following components:   Glucose-Capillary 118 (*)    All other components within normal limits  GLUCOSE, CAPILLARY - Abnormal; Notable for the following components:   Glucose-Capillary 177 (*)    All other components within normal limits  GLUCOSE, CAPILLARY - Abnormal; Notable for the following components:   Glucose-Capillary 52 (*)    All other components within normal limits  GLUCOSE, CAPILLARY - Abnormal; Notable for the following components:   Glucose-Capillary 240 (*)    All other components within normal limits  GLUCOSE, CAPILLARY - Abnormal; Notable for the following components:   Glucose-Capillary 177 (*)    All other components within normal limits  GLUCOSE, CAPILLARY - Abnormal; Notable for the following components:   Glucose-Capillary 58 (*)    All other components within normal limits  GLUCOSE, CAPILLARY - Abnormal; Notable for the following components:   Glucose-Capillary 110 (*)    All other components within normal limits  SARS CORONAVIRUS 2 (HOSPITAL ORDER, [place] [facility])  CULTURE, BLOOD (ROUTINE X 2)  CULTURE, BLOOD (ROUTINE X 2)  MRSA  PCR SCREENING  URINE CULTURE  STREP PNEUMONIAE URINARY ANTIGEN  LEGIONELLA PNEUMOPHILA SEROGP 1 UR AG  TSH  AMMONIA  GLUCOSE, CAPILLARY  GLUCOSE, CAPILLARY  CREATININE, SERUM    EKG EKG Interpretation  Date/Time:  Wednesday October 23 2018 22:21:49 EDT Ventricular Rate:  108 PR Interval:    QRS Duration: 90 QT Interval:  321 QTC Calculation: 427 R Axis:   12 Text Interpretation:  Sinus tachycardia Ventricular premature complex Probable left atrial enlargement Baseline wander in lead(s) V3 No significant change since last tracing Confirmed by Orpah Greek [phone]) on 10/23/2018 10:57:25 PM   Radiology No results found.  Procedures Procedures (including critical care time)  Medications Ordered in ED Medications  acetaminophen (TYLENOL) tablet 650 mg (650 mg Oral Given 10/24/18 0011)  albuterol (PROVENTIL,VENTOLIN) solution continuous neb (10 mg/hr Nebulization Given 10/24/18 0045)  vancomycin (VANCOCIN) 1,500 mg in sodium chloride 0.9 % 500 mL IVPB (1,500 mg Intravenous Transfusing/Transfer 10/24/18 0328)  ceFEPIme (MAXIPIME) 2 g in sodium chloride 0.9 % 100 mL IVPB ( Intravenous Paused 10/24/18 0315)  traZODone (DESYREL) tablet 50 mg (50 mg Oral Given 10/24/18 2056)  sodium chloride 0.9 % with vancomycin (VANCOCIN) ADS Med (has no administration in time range)     Initial Impression / Assessment and Plan / ED Course  I have reviewed the triage vital signs and the nursing notes.  Pertinent labs & imaging results that were available during my care of the patient were reviewed by me and considered in my medical decision making (see chart for details).        Patient with history of COPD, recent hospitalization and discharge yesterday presents to the ER for worsening shortness of breath.  She reports that she improved in the hospital but since going home her breathing has worsened.  She became very short of breath tonight and presented to the ER.  Patient appears very  dyspneic on arrival.  She was initially maintaining her oxygen saturations on 4 L by nasal cannula but minimal movement because severe desaturations.  Patient dropped to 58% on her nasal cannula oxygen with simply moving from the bed to a bedside commode.  She will require repeat hospitalization.  CRITICAL CARE Performed by: Orpah Greek   Total critical care time: 35 minutes  Critical care time was exclusive of separately  billable procedures and treating other patients.  Critical care was necessary to treat or prevent imminent or life-threatening deterioration.  Critical care was time spent personally by me on the following activities: development of treatment plan with patient and/or surrogate as well as nursing, discussions with consultants, evaluation of patient's response to treatment, examination of patient, obtaining history from patient or surrogate, ordering and performing treatments and interventions, ordering and review of laboratory studies, ordering and review of radiographic studies, pulse oximetry and re-evaluation of patient's condition.   Final Clinical Impressions(s) / ED Diagnoses   Final diagnoses:  COPD exacerbation ([place])  Acute on chronic respiratory failure with hypoxia [facility])    ED Discharge Orders         Ordered    amoxicillin-clavulanate (AUGMENTIN) 875-125 MG tablet  Every 12 hours     10/28/18 1043    QUEtiapine (SEROQUEL) 25 MG tablet  Daily at bedtime     10/28/18 1043    Increase activity slowly     10/28/18 1043    Diet - low sodium heart healthy     10/28/18 1043           Shilee Biggs, Gwenyth Allegra, MD 10/24/18 0354    Orpah Greek, MD 10/30/18 1052

## 2018-10-24 ENCOUNTER — Other Ambulatory Visit: Payer: Self-pay | Admitting: *Deleted

## 2018-10-24 DIAGNOSIS — J9621 Acute and chronic respiratory failure with hypoxia: Secondary | ICD-10-CM | POA: Diagnosis present

## 2018-10-24 DIAGNOSIS — J441 Chronic obstructive pulmonary disease with (acute) exacerbation: Secondary | ICD-10-CM | POA: Diagnosis not present

## 2018-10-24 DIAGNOSIS — J189 Pneumonia, unspecified organism: Secondary | ICD-10-CM | POA: Diagnosis present

## 2018-10-24 DIAGNOSIS — E1169 Type 2 diabetes mellitus with other specified complication: Secondary | ICD-10-CM | POA: Diagnosis not present

## 2018-10-24 DIAGNOSIS — I4891 Unspecified atrial fibrillation: Secondary | ICD-10-CM

## 2018-10-24 DIAGNOSIS — E875 Hyperkalemia: Secondary | ICD-10-CM | POA: Diagnosis present

## 2018-10-24 DIAGNOSIS — Y95 Nosocomial condition: Secondary | ICD-10-CM | POA: Diagnosis present

## 2018-10-24 DIAGNOSIS — Z7901 Long term (current) use of anticoagulants: Secondary | ICD-10-CM | POA: Diagnosis not present

## 2018-10-24 DIAGNOSIS — Z9981 Dependence on supplemental oxygen: Secondary | ICD-10-CM | POA: Diagnosis not present

## 2018-10-24 DIAGNOSIS — R9082 White matter disease, unspecified: Secondary | ICD-10-CM | POA: Diagnosis not present

## 2018-10-24 DIAGNOSIS — Z7984 Long term (current) use of oral hypoglycemic drugs: Secondary | ICD-10-CM | POA: Diagnosis not present

## 2018-10-24 DIAGNOSIS — Z7952 Long term (current) use of systemic steroids: Secondary | ICD-10-CM | POA: Diagnosis not present

## 2018-10-24 DIAGNOSIS — Z87891 Personal history of nicotine dependence: Secondary | ICD-10-CM | POA: Diagnosis not present

## 2018-10-24 DIAGNOSIS — D649 Anemia, unspecified: Secondary | ICD-10-CM | POA: Diagnosis present

## 2018-10-24 DIAGNOSIS — I10 Essential (primary) hypertension: Secondary | ICD-10-CM | POA: Diagnosis present

## 2018-10-24 DIAGNOSIS — J440 Chronic obstructive pulmonary disease with acute lower respiratory infection: Secondary | ICD-10-CM | POA: Diagnosis present

## 2018-10-24 DIAGNOSIS — I4892 Unspecified atrial flutter: Secondary | ICD-10-CM | POA: Diagnosis present

## 2018-10-24 DIAGNOSIS — E1165 Type 2 diabetes mellitus with hyperglycemia: Secondary | ICD-10-CM | POA: Diagnosis present

## 2018-10-24 DIAGNOSIS — Z79899 Other long term (current) drug therapy: Secondary | ICD-10-CM | POA: Diagnosis not present

## 2018-10-24 DIAGNOSIS — R410 Disorientation, unspecified: Secondary | ICD-10-CM | POA: Diagnosis not present

## 2018-10-24 DIAGNOSIS — Z9011 Acquired absence of right breast and nipple: Secondary | ICD-10-CM | POA: Diagnosis not present

## 2018-10-24 DIAGNOSIS — Z853 Personal history of malignant neoplasm of breast: Secondary | ICD-10-CM | POA: Diagnosis not present

## 2018-10-24 DIAGNOSIS — Z20828 Contact with and (suspected) exposure to other viral communicable diseases: Secondary | ICD-10-CM | POA: Diagnosis present

## 2018-10-24 DIAGNOSIS — Z833 Family history of diabetes mellitus: Secondary | ICD-10-CM | POA: Diagnosis not present

## 2018-10-24 DIAGNOSIS — G9340 Encephalopathy, unspecified: Secondary | ICD-10-CM | POA: Diagnosis present

## 2018-10-24 DIAGNOSIS — T380X5A Adverse effect of glucocorticoids and synthetic analogues, initial encounter: Secondary | ICD-10-CM | POA: Diagnosis present

## 2018-10-24 LAB — CBC WITH DIFFERENTIAL/PLATELET
Abs Immature Granulocytes: 0.06 10*3/uL (ref 0.00–0.07)
Basophils Absolute: 0 10*3/uL (ref 0.0–0.1)
Basophils Relative: 0 %
Eosinophils Absolute: 0.2 10*3/uL (ref 0.0–0.5)
Eosinophils Relative: 1 %
HCT: 40.4 % (ref 36.0–46.0)
Hemoglobin: 11.1 g/dL — ABNORMAL LOW (ref 12.0–15.0)
Immature Granulocytes: 1 %
Lymphocytes Relative: 9 %
Lymphs Abs: 1.1 10*3/uL (ref 0.7–4.0)
MCH: 27.5 pg (ref 26.0–34.0)
MCHC: 27.5 g/dL — ABNORMAL LOW (ref 30.0–36.0)
MCV: 100.2 fL — ABNORMAL HIGH (ref 80.0–100.0)
Monocytes Absolute: 1.2 10*3/uL — ABNORMAL HIGH (ref 0.1–1.0)
Monocytes Relative: 10 %
Neutro Abs: 9 10*3/uL — ABNORMAL HIGH (ref 1.7–7.7)
Neutrophils Relative %: 79 %
Platelets: 265 10*3/uL (ref 150–400)
RBC: 4.03 MIL/uL (ref 3.87–5.11)
RDW: 13.9 % (ref 11.5–15.5)
WBC: 11.5 10*3/uL — ABNORMAL HIGH (ref 4.0–10.5)
nRBC: 0 % (ref 0.0–0.2)

## 2018-10-24 LAB — COMPREHENSIVE METABOLIC PANEL
ALT: 21 U/L (ref 0–44)
AST: 15 U/L (ref 15–41)
Albumin: 3.9 g/dL (ref 3.5–5.0)
Alkaline Phosphatase: 76 U/L (ref 38–126)
Anion gap: 14 (ref 5–15)
BUN: 17 mg/dL (ref 8–23)
CO2: 39 mmol/L — ABNORMAL HIGH (ref 22–32)
Calcium: 9.4 mg/dL (ref 8.9–10.3)
Chloride: 92 mmol/L — ABNORMAL LOW (ref 98–111)
Creatinine, Ser: 0.6 mg/dL (ref 0.44–1.00)
GFR calc Af Amer: >60 mL/min (ref >60)
GFR calc non Af Amer: >60 mL/min (ref >60)
Glucose, Bld: 184 mg/dL — ABNORMAL HIGH (ref 70–99)
Potassium: 3.7 mmol/L (ref 3.5–5.1)
Sodium: 145 mmol/L (ref 135–145)
Total Bilirubin: 0.7 mg/dL (ref 0.3–1.2)
Total Protein: 6.6 g/dL (ref 6.5–8.1)

## 2018-10-24 LAB — CBC
HCT: 40.1 % (ref 36.0–46.0)
Hemoglobin: 11.1 g/dL — ABNORMAL LOW (ref 12.0–15.0)
MCH: 28 pg (ref 26.0–34.0)
MCHC: 27.7 g/dL — ABNORMAL LOW (ref 30.0–36.0)
MCV: 101.3 fL — ABNORMAL HIGH (ref 80.0–100.0)
Platelets: 254 10*3/uL (ref 150–400)
RBC: 3.96 MIL/uL (ref 3.87–5.11)
RDW: 13.8 % (ref 11.5–15.5)
WBC: 9.8 10*3/uL (ref 4.0–10.5)
nRBC: 0 % (ref 0.0–0.2)

## 2018-10-24 LAB — BASIC METABOLIC PANEL
Anion gap: 9 (ref 5–15)
BUN: 19 mg/dL (ref 8–23)
CO2: 41 mmol/L — ABNORMAL HIGH (ref 22–32)
Calcium: 9.4 mg/dL (ref 8.9–10.3)
Chloride: 93 mmol/L — ABNORMAL LOW (ref 98–111)
Creatinine, Ser: 0.64 mg/dL (ref 0.44–1.00)
GFR calc Af Amer: >60 mL/min (ref >60)
GFR calc non Af Amer: >60 mL/min (ref >60)
Glucose, Bld: 201 mg/dL — ABNORMAL HIGH (ref 70–99)
Potassium: 4.3 mmol/L (ref 3.5–5.1)
Sodium: 143 mmol/L (ref 135–145)

## 2018-10-24 LAB — MRSA PCR SCREENING: MRSA by PCR: NEGATIVE

## 2018-10-24 LAB — STREP PNEUMONIAE URINARY ANTIGEN: Strep Pneumo Urinary Antigen: NEGATIVE

## 2018-10-24 LAB — GLUCOSE, CAPILLARY
Glucose-Capillary: 159 mg/dL — ABNORMAL HIGH (ref 70–99)
Glucose-Capillary: 165 mg/dL — ABNORMAL HIGH (ref 70–99)
Glucose-Capillary: 277 mg/dL — ABNORMAL HIGH (ref 70–99)
Glucose-Capillary: 298 mg/dL — ABNORMAL HIGH (ref 70–99)
Glucose-Capillary: 263 mg/dL — ABNORMAL HIGH (ref 70–99)

## 2018-10-24 LAB — CULTURE, BLOOD (ROUTINE X 2)
Culture: NO GROWTH
Culture: NO GROWTH
Special Requests: ADEQUATE
Special Requests: ADEQUATE

## 2018-10-24 LAB — BRAIN NATRIURETIC PEPTIDE: B Natriuretic Peptide: 318 pg/mL — ABNORMAL HIGH (ref 0.0–100.0)

## 2018-10-24 LAB — SARS CORONAVIRUS 2 BY RT PCR (HOSPITAL ORDER, PERFORMED IN ~~LOC~~ HOSPITAL LAB): SARS Coronavirus 2: NEGATIVE

## 2018-10-24 MED ORDER — ACETAMINOPHEN 650 MG RE SUPP: 650.0000 mg | Freq: Four times a day (QID) | RECTAL | Status: DC | PRN

## 2018-10-24 MED ORDER — ALBUTEROL SULFATE (2.5 MG/3ML) 0.083% IN NEBU
2.5000 mg | INHALATION_SOLUTION | Freq: Four times a day (QID) | RESPIRATORY_TRACT | Status: DC
  Administered 2018-10-24 – 2018-10-26 (×7): 2.5 mg via RESPIRATORY_TRACT
  Filled 2018-10-24 (×8): qty 3

## 2018-10-24 MED ORDER — VANCOMYCIN HCL 1.25 G IV SOLR
1250.0000 mg | INTRAVENOUS | Status: DC
  Administered 2018-10-25: 04:00:00 1250 mg via INTRAVENOUS
  Filled 2018-10-24 (×4): qty 1250

## 2018-10-24 MED ORDER — ALBUTEROL (5 MG/ML) CONTINUOUS INHALATION SOLN
10.0000 mg/h | INHALATION_SOLUTION | Freq: Once | RESPIRATORY_TRACT | Status: AC
  Administered 2018-10-24: 10 mg/h via RESPIRATORY_TRACT
  Filled 2018-10-24: qty 20

## 2018-10-24 MED ORDER — ACETAMINOPHEN 325 MG PO TABS
650.0000 mg | ORAL_TABLET | Freq: Four times a day (QID) | ORAL | Status: DC | PRN
  Administered 2018-10-24 – 2018-10-28 (×2): 650 mg via ORAL
  Filled 2018-10-24 (×2): qty 2

## 2018-10-24 MED ORDER — FUROSEMIDE 40 MG PO TABS
60.0000 mg | ORAL_TABLET | Freq: Every day | ORAL | Status: DC
  Administered 2018-10-26 – 2018-10-28 (×3): 60 mg via ORAL
  Filled 2018-10-24 (×5): qty 1

## 2018-10-24 MED ORDER — INSULIN ASPART 100 UNIT/ML ~~LOC~~ SOLN
0.0000 [IU] | Freq: Every day | SUBCUTANEOUS | Status: DC
  Administered 2018-10-24: 21:00:00 3 [IU] via SUBCUTANEOUS

## 2018-10-24 MED ORDER — AMIODARONE HCL 200 MG PO TABS
200.0000 mg | ORAL_TABLET | Freq: Every day | ORAL | Status: DC
  Administered 2018-10-24 – 2018-10-28 (×5): 200 mg via ORAL
  Filled 2018-10-24 (×5): qty 1

## 2018-10-24 MED ORDER — SODIUM CHLORIDE 0.9% FLUSH: 3.0000 mL | INTRAVENOUS | Status: DC | PRN

## 2018-10-24 MED ORDER — TRAZODONE HCL 50 MG PO TABS
50.0000 mg | ORAL_TABLET | Freq: Once | ORAL | Status: AC
  Administered 2018-10-24: 50 mg via ORAL
  Filled 2018-10-24: qty 1

## 2018-10-24 MED ORDER — VANCOMYCIN HCL 10 G IV SOLR
INTRAVENOUS | Status: AC
  Filled 2018-10-24: qty 1500

## 2018-10-24 MED ORDER — INSULIN ASPART 100 UNIT/ML ~~LOC~~ SOLN
0.0000 [IU] | Freq: Three times a day (TID) | SUBCUTANEOUS | Status: DC
  Administered 2018-10-24: 09:00:00 2 [IU] via SUBCUTANEOUS
  Administered 2018-10-24 – 2018-10-25 (×3): 5 [IU] via SUBCUTANEOUS

## 2018-10-24 MED ORDER — VANCOMYCIN HCL 1.5 G IV SOLR
1500.0000 mg | Freq: Once | INTRAVENOUS | Status: AC
  Administered 2018-10-24: 03:00:00 1500 mg via INTRAVENOUS
  Filled 2018-10-24: qty 1500

## 2018-10-24 MED ORDER — METFORMIN HCL ER 500 MG PO TB24
500.0000 mg | ORAL_TABLET | Freq: Every day | ORAL | Status: DC
  Administered 2018-10-24 – 2018-10-28 (×5): 500 mg via ORAL
  Filled 2018-10-24 (×5): qty 1

## 2018-10-24 MED ORDER — GUAIFENESIN ER 600 MG PO TB12
600.0000 mg | ORAL_TABLET | Freq: Two times a day (BID) | ORAL | Status: DC
  Administered 2018-10-24 – 2018-10-28 (×9): 600 mg via ORAL
  Filled 2018-10-24 (×9): qty 1

## 2018-10-24 MED ORDER — FLUTICASONE-UMECLIDIN-VILANT 100-62.5-25 MCG/INH IN AEPB: 1.0000 | INHALATION_SPRAY | Freq: Every day | RESPIRATORY_TRACT | Status: DC

## 2018-10-24 MED ORDER — METHYLPREDNISOLONE SODIUM SUCC 125 MG IJ SOLR
80.0000 mg | Freq: Three times a day (TID) | INTRAMUSCULAR | Status: DC
  Administered 2018-10-24 – 2018-10-25 (×4): 80 mg via INTRAVENOUS
  Filled 2018-10-24 (×4): qty 2

## 2018-10-24 MED ORDER — SODIUM CHLORIDE 0.9 % IV SOLN
250.0000 mL | INTRAVENOUS | Status: DC | PRN
  Administered 2018-10-26: 15:00:00 250 mL via INTRAVENOUS

## 2018-10-24 MED ORDER — RIVAROXABAN 20 MG PO TABS
20.0000 mg | ORAL_TABLET | Freq: Every day | ORAL | Status: DC
  Administered 2018-10-24 – 2018-10-27 (×4): 20 mg via ORAL
  Filled 2018-10-24 (×6): qty 1

## 2018-10-24 MED ORDER — SODIUM CHLORIDE 0.9% FLUSH
3.0000 mL | Freq: Two times a day (BID) | INTRAVENOUS | Status: DC
  Administered 2018-10-24 – 2018-10-27 (×8): 3 mL via INTRAVENOUS

## 2018-10-24 MED ORDER — METOPROLOL TARTRATE 50 MG PO TABS
50.0000 mg | ORAL_TABLET | Freq: Two times a day (BID) | ORAL | Status: DC
  Administered 2018-10-24 – 2018-10-28 (×9): 50 mg via ORAL
  Filled 2018-10-24 (×9): qty 1

## 2018-10-24 MED ORDER — POTASSIUM CHLORIDE CRYS ER 20 MEQ PO TBCR
20.0000 meq | EXTENDED_RELEASE_TABLET | Freq: Two times a day (BID) | ORAL | Status: DC
  Administered 2018-10-24 – 2018-10-25 (×3): 20 meq via ORAL
  Filled 2018-10-24 (×3): qty 1

## 2018-10-24 MED ORDER — SODIUM CHLORIDE 0.9 % IV SOLN
2.0000 g | Freq: Two times a day (BID) | INTRAVENOUS | Status: DC
  Administered 2018-10-24 – 2018-10-26 (×4): 2 g via INTRAVENOUS
  Filled 2018-10-24 (×5): qty 2

## 2018-10-24 MED ORDER — DOCUSATE SODIUM 100 MG PO CAPS
100.0000 mg | ORAL_CAPSULE | Freq: Every day | ORAL | Status: DC
  Administered 2018-10-25 – 2018-10-28 (×4): 100 mg via ORAL
  Filled 2018-10-24 (×5): qty 1

## 2018-10-24 MED ORDER — FLUTICASONE FUROATE-VILANTEROL 100-25 MCG/INH IN AEPB
1.0000 | INHALATION_SPRAY | Freq: Every day | RESPIRATORY_TRACT | Status: DC
  Administered 2018-10-25 – 2018-10-28 (×4): 1 via RESPIRATORY_TRACT
  Filled 2018-10-24: qty 28

## 2018-10-24 MED ORDER — SODIUM CHLORIDE 0.9 % IV SOLN
2.0000 g | Freq: Once | INTRAVENOUS | Status: AC
  Administered 2018-10-24: 2 g via INTRAVENOUS
  Filled 2018-10-24: qty 2

## 2018-10-24 MED ORDER — UMECLIDINIUM BROMIDE 62.5 MCG/INH IN AEPB
1.0000 | INHALATION_SPRAY | Freq: Every day | RESPIRATORY_TRACT | Status: DC
  Administered 2018-10-24 – 2018-10-28 (×5): 1 via RESPIRATORY_TRACT
  Filled 2018-10-24: qty 7

## 2018-10-24 MED ORDER — ALBUTEROL SULFATE (2.5 MG/3ML) 0.083% IN NEBU: 2.5000 mg | INHALATION_SOLUTION | Freq: Four times a day (QID) | RESPIRATORY_TRACT | Status: DC | PRN

## 2018-10-24 NOTE — Patient Outreach (Signed)
Red EMMI flags noted for pt on 10/23/18 and noted pt hospitalized [facility] 10/23/18 for COPD exacerbation, Message received from [place] advising of inpatient admit,  RN CM replied with in basket to LCSW with brief update of pt status being out of 2 medications and primary care MD office (pharmacists) are assisting pt per workflow and have samples for pt.    PLAN Continue to follow  Jacqlyn Larsen [facility], BSN [place] Coordinator [phone]

## 2018-10-24 NOTE — H&P (Signed)
TRH H&P    Patient Demographics:    Eileen [name], is a 73 y.o. female  MRN: [id number]  DOB - [birth year]  Admit Date - 10/23/2018  Referring MD/NP/PA: Malachy Moan  Outpatient Primary MD for the patient is Lavella Lemons, PA  Patient coming from: home  Chief complaint- sob   HPI:    Eileen Vallecillo  is a 73 y.o. female,  w  Right breast cancer in remission,.L1 compression fracture s/p vertebroplasty, Anemia (iron def), Diverticulosis, Dm2, Aflutter, Moderate mitral regurgitation (06/27/2018),  Copd (severe), chronic respiratory failure on 3.5 L who was recently admitted for Copd exacerbation and pneumonia on 10/18/2018 and now presents with c/o dyspnea. Slight dry cough. No fever, no chills, no chest pain.   In ED, T 98.4, P 109 R 28 Bp 133/63  Pox 86% on 3.5L [place]  CXR IMPRESSION: Slight interval worsening in the patchy airspace opacities in both lung bases which could be due to edema, atelectasis, and/or infectious etiology.  EKG ST at 108, nl axis, early R progression,   Wbc 11.5, Hgb 11.1, Plt 265 Na 143, K 4.3,  Glucose 201, Bun 19, Creatinine .064 BNP 318  Pt will be admitted for acute on chronic respiratory failure secondary to Copd exacerbation and Hcap   Review of systems:    In addition to the HPI above,  No Fever-chills, No Headache, No changes with Vision or hearing, No problems swallowing food or Liquids, No Chest pain,  No Abdominal pain, No Nausea or Vomiting, bowel movements are regular, No Blood in stool or Urine, No dysuria, No new skin rashes or bruises, No new joints pains-aches,  No new weakness, tingling, numbness in any extremity, No recent weight gain or loss, No polyuria, polydypsia or polyphagia, No significant Mental Stressors.  All other systems reviewed and are negative.    Past History of the following :    Past Medical History:  Diagnosis Date  .  Asthma   . Atrial flutter ([place])   . Cancer [facility])    Right breast  . COPD (chronic obstructive pulmonary disease) ([place])   . History of breast cancer    right breast  . Hypertension   . On home O2   . Type 2 diabetes mellitus ([place])       Past Surgical History:  Procedure Laterality Date  . BREAST SURGERY  2011   Right breast mastectomy  . CARDIOVERSION N/A 06/27/2018   Procedure: CARDIOVERSION;  Surgeon: Arnoldo Lenis, MD;  [facility];  Service: Endoscopy;  Laterality: N/A;  . CESAREAN SECTION    . COLONOSCOPY N/A 01/22/2018   Procedure: COLONOSCOPY;  Surgeon: Rogene Houston, MD;  Location: [facility];  Service: Endoscopy;  Laterality: N/A;  . HERNIA REPAIR     RIH  . IR VERTEBROPLASTY LUMBAR BX INC UNI/BIL INC/INJECT/IMAGING  09/05/2018  . MASTECTOMY  2011   right breast  . TEE WITHOUT CARDIOVERSION N/A 06/27/2018   Procedure: TRANSESOPHAGEAL ECHOCARDIOGRAM (TEE) WITH PROPOFOL;  Surgeon: Arnoldo Lenis, MD;  Location[facility]  [facility];  Service: Endoscopy;  Laterality: N/A;      Social History:      Social History   Tobacco Use  . Smoking status: Former Smoker    Packs/day: 0.50    Years: 32.00    Pack years: 16.00    Types: Cigarettes    Start date: 12/12/1962    Quit date: 03/13/1994    Years since quitting: 24.6  . Smokeless tobacco: Never Used  Substance Use Topics  . Alcohol use: No    Alcohol/week: 0.0 standard drinks       Family History :     Family History  Problem Relation Age of Onset  . Cancer Mother        lung  . Diabetes Brother       Home Medications:   Prior to Admission medications   Medication Sig Start Date End Date Taking? Authorizing Provider  acetaminophen (TYLENOL) 500 MG tablet Take 500 mg by mouth every 8 (eight) hours as needed for mild pain or headache.     Lavella Lemons, PA  albuterol (PROVENTIL) (2.5 MG/3ML) 0.083% nebulizer solution Take 2.5 mg by nebulization every 6 (six) hours as needed for  wheezing or shortness of breath.    [provider]  amiodarone (PACERONE) 200 MG tablet Take 1 tablet (200 mg total) by mouth daily. 07/20/18   Strader, Fransisco Hertz, PA-C  docusate sodium (COLACE) 100 MG capsule Take 1 capsule (100 mg total) by mouth 2 (two) times daily. Patient taking differently: Take 100 mg by mouth daily.  08/22/18   Barton Dubois, MD  doxycycline (VIBRA-TABS) 100 MG tablet Take 1 tablet (100 mg total) by mouth every 12 (twelve) hours for 5 days. 10/21/18 10/26/18  Barton Dubois, MD  furosemide (LASIX) 40 MG tablet Take 1.5 tablets (60 mg total) by mouth daily. 10/21/18   Barton Dubois, MD  guaiFENesin (MUCINEX) 600 MG 12 hr tablet Take 1 tablet (600 mg total) by mouth 2 (two) times daily. 08/22/18   Barton Dubois, MD  metFORMIN (GLUCOPHAGE-XR) 500 MG 24 hr tablet Take 500 mg by mouth daily with breakfast.  08/10/17   [provider]  metoprolol tartrate (LOPRESSOR) 50 MG tablet Take 50 mg by mouth 2 (two) times daily. 04/12/18   [provider]  polyethylene glycol (MIRALAX / GLYCOLAX) 17 g packet Take 17 g by mouth daily as needed for mild constipation. Patient not taking: Reported on 10/19/2018 08/22/18   Barton Dubois, MD  potassium chloride SA (K-DUR) 20 MEQ tablet Take 20 mEq by mouth 2 (two) times daily.    [provider]  predniSONE (DELTASONE) 10 MG tablet Take 1 tablet (10 mg total) by mouth daily with breakfast. Hold for now and resume when a steroid tapering completed. 10/21/18   Barton Dubois, MD  predniSONE (DELTASONE) 10 MG tablet Take 5 tablets (50 mg total) by mouth daily with breakfast. And decrease by one tablet every 2 days, until down to your chronic 10 mg daily. 10/21/18   Barton Dubois, MD  PROAIR HFA 108 [phone] BASE) MCG/ACT inhaler Inhale 2 puffs into the lungs every 6 (six) hours as needed for wheezing or shortness of breath.  03/06/11   [provider]  rivaroxaban (XARELTO) 20 MG TABS tablet Take 1 tablet (20 mg  total) by mouth daily with supper. Stop Xarelto until Monday (08/26/18) in anticipation for kyphoplasty 08/22/18   Barton Dubois, MD  TRELEGY ELLIPTA 100-62.5-25 MCG/INH AEPB Take 1 puff by mouth  daily. 08/27/17   [provider]     Allergies:     Allergies  Allergen Reactions  . Codeine Other (See Comments)    "jittery"     Physical Exam:   Vitals  Blood pressure 136/67, pulse 93, temperature 97.8 F (36.6 C), temperature source Oral, resp. rate (!) 25, height 5\' 2"  (1.575 m), weight 69.9 kg, SpO2 99 %.  1.  General: axoxo3  2. Psychiatric: euthymic  3. Neurologic: cn2-12 intact, reflexes 2+ symmetric, diffuse with no clonus, motor 5/5 in all 4 ext  4. HEENMT:  Anicteric, pupils 1.25mm symmetric, direct, consensual, near intact Neck: no jvd, no bruit  5. Respiratory : Tight, prolonged exp phase, + bilateral exp wheezing, slight crackles right lung base  6. Cardiovascular : rrr s1, s2,   7. Gastrointestinal:  Abd: soft, nt, nd, +bs  8. Skin:  Ext: no c/c/e,  No rash  9.Musculoskeletal:  Good ROM,  No adenopathy    Data Review:    CBC Recent Labs  Lab 10/18/18 1834 10/20/18 0703 10/24/18 0000 10/24/18 0221  WBC 9.6 12.1* 11.5* 9.8  HGB 11.5* 11.0* 11.1* 11.1*  HCT 41.9 38.3 40.4 40.1  PLT 232 259 265 254  MCV 101.9* 97.5 100.2* 101.3*  MCH 28.0 28.0 27.5 28.0  MCHC 27.4* 28.7* 27.5* 27.7*  RDW 13.9 13.9 13.9 13.8  LYMPHSABS 1.3  --  1.1  --   MONOABS 0.5  --  1.2*  --   EOSABS 0.0  --  0.2  --   BASOSABS 0.0  --  0.0  --    ------------------------------------------------------------------------------------------------------------------  Results for orders placed or performed during the hospital encounter of 10/23/18 (from the past 48 hour(s))  SARS Coronavirus 2 [facility] order, Performed in [facility] hospital lab) Nasopharyngeal Nasopharyngeal Swab     Status: None   Collection Time: 10/23/18 11:14 PM   Specimen: Nasopharyngeal  Swab  Result Value Ref Range   SARS Coronavirus 2 NEGATIVE NEGATIVE    Comment: (NOTE) If result is NEGATIVE SARS-CoV-2 target nucleic acids are NOT DETECTED. The SARS-CoV-2 RNA is generally detectable in upper and lower  respiratory specimens during the acute phase of infection. The lowest  concentration of SARS-CoV-2 viral copies this assay can detect is 250  copies / mL. A negative result does not preclude SARS-CoV-2 infection  and should not be used as the sole basis for treatment or other  patient management decisions.  A negative result may occur with  improper specimen collection / handling, submission of specimen other  than nasopharyngeal swab, presence of viral mutation(s) within the  areas targeted by this assay, and inadequate number of viral copies  (<250 copies / mL). A negative result must be combined with clinical  observations, patient history, and epidemiological information. If result is POSITIVE SARS-CoV-2 target nucleic acids are DETECTED. The SARS-CoV-2 RNA is generally detectable in upper and lower  respiratory specimens dur ing the acute phase of infection.  Positive  results are indicative of active infection with SARS-CoV-2.  Clinical  correlation with patient history and other diagnostic information is  necessary to determine patient infection status.  Positive results do  not rule out bacterial infection or co-infection with other viruses. If result is PRESUMPTIVE POSTIVE SARS-CoV-2 nucleic acids MAY BE PRESENT.   A presumptive positive result was obtained on the submitted specimen  and confirmed on repeat testing.  While 2019 novel coronavirus  (SARS-CoV-2) nucleic acids may be present in the submitted sample  additional  confirmatory testing may be necessary for epidemiological  and / or clinical management purposes  to differentiate between  SARS-CoV-2 and other Sarbecovirus currently known to infect humans.  If clinically indicated additional testing  with an alternate test  methodology [phone]) is advised. The SARS-CoV-2 RNA is generally  detectable in upper and lower respiratory sp ecimens during the acute  phase of infection. The expected result is Negative. Fact Sheet for Patients:  StrictlyIdeas.no Fact Sheet for Healthcare Providers: BankingDealers.co.za This test is not yet approved or cleared by the [place] FDA and has been authorized for detection and/or diagnosis of SARS-CoV-2 by FDA under an Emergency Use Authorization (EUA).  This EUA will remain in effect (meaning this test can be used) for the duration of the COVID-19 declaration under Section 564(b)(1) of the Act, 21 U.S.C. section 360bbb-3(b)(1), unless the authorization is terminated or revoked sooner. Performed at [facility], [address] [place], [place] [address]   CBC with Differential/Platelet     Status: Abnormal   Collection Time: 10/24/18 12:00 AM  Result Value Ref Range   WBC 11.5 (H) 4.0 - 10.5 K/uL   RBC 4.03 3.87 - 5.11 MIL/uL   Hemoglobin 11.1 (L) 12.0 - 15.0 g/dL   HCT 40.4 36.0 - 46.0 %   MCV 100.2 (H) 80.0 - 100.0 fL   MCH 27.5 26.0 - 34.0 pg   MCHC 27.5 (L) 30.0 - 36.0 g/dL   RDW 13.9 11.5 - 15.5 %   Platelets 265 150 - 400 K/uL   nRBC 0.0 0.0 - 0.2 %   Neutrophils Relative % 79 %   Neutro Abs 9.0 (H) 1.7 - 7.7 K/uL   Lymphocytes Relative 9 %   Lymphs Abs 1.1 0.7 - 4.0 K/uL   Monocytes Relative 10 %   Monocytes Absolute 1.2 (H) 0.1 - 1.0 K/uL   Eosinophils Relative 1 %   Eosinophils Absolute 0.2 0.0 - 0.5 K/uL   Basophils Relative 0 %   Basophils Absolute 0.0 0.0 - 0.1 K/uL   Immature Granulocytes 1 %   Abs Immature Granulocytes 0.06 0.00 - 0.07 K/uL    Comment: Performed at [facility], [address] [place], [place] [address]  Basic metabolic panel     Status: Abnormal   Collection Time: 10/24/18 12:00 AM  Result Value Ref Range   Sodium 143 135 - 145 mmol/L   Potassium  4.3 3.5 - 5.1 mmol/L   Chloride 93 (L) 98 - 111 mmol/L   CO2 41 (H) 22 - 32 mmol/L   Glucose, Bld 201 (H) 70 - 99 mg/dL   BUN 19 8 - 23 mg/dL   Creatinine, Ser 0.64 0.44 - 1.00 mg/dL   Calcium 9.4 8.9 - 10.3 mg/dL   GFR calc non Af Amer >60 >60 mL/min   GFR calc Af Amer >60 >60 mL/min   Anion gap 9 5 - 15    Comment: Performed at [facility], [address], [place], [place] [address]  Brain natriuretic peptide     Status: Abnormal   Collection Time: 10/24/18 12:00 AM  Result Value Ref Range   B Natriuretic Peptide 318.0 (H) 0.0 - 100.0 pg/mL    Comment: Performed at [facility], [address] [place], [place] [address]  CBC     Status: Abnormal   Collection Time: 10/24/18  2:21 AM  Result Value Ref Range   WBC 9.8 4.0 - 10.5 K/uL   RBC 3.96 3.87 - 5.11 MIL/uL   Hemoglobin  11.1 (L) 12.0 - 15.0 g/dL   HCT 40.1 36.0 - 46.0 %   MCV 101.3 (H) 80.0 - 100.0 fL   MCH 28.0 26.0 - 34.0 pg   MCHC 27.7 (L) 30.0 - 36.0 g/dL   RDW 13.8 11.5 - 15.5 %   Platelets 254 150 - 400 K/uL   nRBC 0.0 0.0 - 0.2 %    Comment: Performed at [facility], [address] [place], [place] [address]    Chemistries  Recent Labs  Lab 10/18/18 1834 10/20/18 0703 10/24/18 0000  NA 142 142 143  K 5.0 4.7 4.3  CL 94* 91* 93*  CO2 37* 38* 41*  GLUCOSE 291* 238* 201*  BUN 12 21 19   CREATININE 0.61 0.74 0.64  CALCIUM 9.6 9.5 9.4   ------------------------------------------------------------------------------------------------------------------  ------------------------------------------------------------------------------------------------------------------ GFR: Estimated Creatinine Clearance: 57.3 mL/min (by C-G formula based on SCr of 0.64 mg/dL). Liver Function Tests: No results for input(s): AST, ALT, ALKPHOS, BILITOT, PROT, ALBUMIN in the last 168 hours. No results for input(s): LIPASE, AMYLASE in the last 168 hours. No results for input(s): AMMONIA in the last 168 hours. Coagulation Profile:  No results for input(s): INR, PROTIME in the last 168 hours. Cardiac Enzymes: No results for input(s): CKTOTAL, CKMB, CKMBINDEX, TROPONINI in the last 168 hours. BNP (last 3 results) No results for input(s): PROBNP in the last 8760 hours. HbA1C: No results for input(s): HGBA1C in the last 72 hours. CBG: Recent Labs  Lab 10/20/18 1605 10/20/18 2221 10/21/18 0815 10/21/18 1255 10/21/18 1612  GLUCAP 136* 269* 220* 346* 142*   Lipid Profile: No results for input(s): CHOL, HDL, LDLCALC, TRIG, CHOLHDL, LDLDIRECT in the last 72 hours. Thyroid Function Tests: No results for input(s): TSH, T4TOTAL, FREET4, T3FREE, THYROIDAB in the last 72 hours. Anemia Panel: No results for input(s): VITAMINB12, FOLATE, FERRITIN, TIBC, IRON, RETICCTPCT in the last 72 hours.  --------------------------------------------------------------------------------------------------------------- Urine analysis:    Component Value Date/Time   COLORURINE YELLOW 09/05/2018 0850   APPEARANCEUR CLEAR 09/05/2018 0850   LABSPEC 1.032 (H) 09/05/2018 0850   PHURINE 5.0 09/05/2018 0850   GLUCOSEU >=500 (A) 09/05/2018 0850   HGBUR NEGATIVE 09/05/2018 0850   BILIRUBINUR NEGATIVE 09/05/2018 0850   KETONESUR 5 (A) 09/05/2018 0850   PROTEINUR 100 (A) 09/05/2018 0850   UROBILINOGEN 0.2 09/09/2009 1010   NITRITE NEGATIVE 09/05/2018 0850   LEUKOCYTESUR NEGATIVE 09/05/2018 0850      Imaging Results:    Dg Chest Port 1 View  Result Date: 10/23/2018 CLINICAL DATA:  Shortness of breath EXAM: PORTABLE CHEST 1 VIEW COMPARISON:  October 18, 2018 FINDINGS: There is slight interval worsening in the patchy airspace opacities seen at both lung bases. There is mildly increased interstitial markings seen throughout both lungs. The cardiomediastinal silhouette is unchanged. No acute osseous abnormality. IMPRESSION: Slight interval worsening in the patchy airspace opacities in both lung bases which could be due to edema, atelectasis,  and/or infectious etiology. Electronically Signed   By: Prudencio Pair M.D.   On: 10/23/2018 23:28       Assessment & Plan:    Principal Problem:   COPD exacerbation ([place]) Active Problems:   Atrial flutter ([facility])   Atrial fibrillation with RVR ([facility])   Diabetes mellitus ([place])  Acute respiratory failure with hypoxia Secondary to HCAP, COPD exacerbation  COPD exacerbation, Pt is a CO2 retainer, keep o2 sat <96% DC chronic oral prednisone-> Solu-Medrol 80 mg IV 3 times daily Antibiotics as above Continue Trelegy Albuterol neb q6h and q6h prn  HCAP Blood cultures X2 sets Urine strep antigen Urine Legionella antigen MRSA PCR screen Vanco IV pharmacy to dose, cefepime IV pharmacy to dose  Atrial flutter, moderate MR Continue amiodarone 200 mg p.o. daily Continue Xarelto pharmacy to dose Continue Lopressor 50 mg p.o. twice daily  Edema Continue Lasix 60 mg p.o. daily Continue potassium chloride Check CMP in a.m.  Diabetes type 2 (Hga1c=8.5 on 09/29/2018) Continue metformin Fingerstick blood sugars before meals and nightly, insulin sliding scale(sensitive)  Anemia, history of diverticulosis Check CBC in a.m.  History of L1 compression fracture status post vertebroplasty Follow-up with PCP, will need regular bone density testing as well as possible anti-resorptive therapy  DVT Prophylaxis-   Xarelto, SCD  AM Labs Ordered, also please review Full Orders  Family Communication: Admission, patients condition and plan of care including tests being ordered have been discussed with the patient and grandson who indicate understanding and agree with the plan and Code Status.  Code Status:  FULL CODE, attempted to call daughter, no response, left message that pt will be admitted to [facility]  Admission status: Inpatient: Based on patients clinical presentation and evaluation of above clinical data, I have made determination that patient meets Inpatient criteria at this time.   Patient has a high risk of clinical deterioration, patient will require IV antibiotics for H CAP, and IV Solu-Medrol for COPD exacerbation, patient will require greater than 2 night stay, patient will require inpatient admission   Time spent in minutes : 70     Jani Gravel M.D on 10/24/2018 at 3:02 AM

## 2018-10-24 NOTE — Progress Notes (Signed)
ANTIBIOTIC CONSULT NOTE-Preliminary  Pharmacy Consult for vancomycin and cefepime Indication: pneumonia  Allergies  Allergen Reactions  . Codeine Other (See Comments)    "jittery"    Patient Measurements: Height: 5\' 2"  (157.5 cm) Weight: 154 lb (69.9 kg) IBW/kg (Calculated) : 50.1 Adjusted Body Weight:   Vital Signs: Temp: 97.8 F (36.6 C) (08/13 0130) Temp Source: Oral (08/13 0130) BP: 136/67 (08/13 0130) Pulse Rate: 93 (08/13 0130)  Labs: Recent Labs    10/24/18 0000  WBC 11.5*  HGB 11.1*  PLT 265  CREATININE 0.64    Estimated Creatinine Clearance: 57.3 mL/min (by C-G formula based on SCr of 0.64 mg/dL).  No results for input(s): VANCOTROUGH, VANCOPEAK, VANCORANDOM, GENTTROUGH, GENTPEAK, GENTRANDOM, TOBRATROUGH, TOBRAPEAK, TOBRARND, AMIKACINPEAK, AMIKACINTROU, AMIKACIN in the last 72 hours.   Microbiology: Recent Results (from the past 720 hour(s))  SARS Coronavirus 2 (CEPHEID- Performed in [facility] hospital lab), Hosp Order     Status: None   Collection Time: 09/26/18  2:18 PM   Specimen: Nasopharyngeal Swab  Result Value Ref Range Status   SARS Coronavirus 2 NEGATIVE NEGATIVE Final    Comment: (NOTE) If result is NEGATIVE SARS-CoV-2 target nucleic acids are NOT DETECTED. The SARS-CoV-2 RNA is generally detectable in upper and lower  respiratory specimens during the acute phase of infection. The lowest  concentration of SARS-CoV-2 viral copies this assay can detect is 250  copies / mL. A negative result does not preclude SARS-CoV-2 infection  and should not be used as the sole basis for treatment or other  patient management decisions.  A negative result may occur with  improper specimen collection / handling, submission of specimen other  than nasopharyngeal swab, presence of viral mutation(s) within the  areas targeted by this assay, and inadequate number of viral copies  (<250 copies / mL). A negative result must be combined with clinical   observations, patient history, and epidemiological information. If result is POSITIVE SARS-CoV-2 target nucleic acids are DETECTED. The SARS-CoV-2 RNA is generally detectable in upper and lower  respiratory specimens dur ing the acute phase of infection.  Positive  results are indicative of active infection with SARS-CoV-2.  Clinical  correlation with patient history and other diagnostic information is  necessary to determine patient infection status.  Positive results do  not rule out bacterial infection or co-infection with other viruses. If result is PRESUMPTIVE POSTIVE SARS-CoV-2 nucleic acids MAY BE PRESENT.   A presumptive positive result was obtained on the submitted specimen  and confirmed on repeat testing.  While 2019 novel coronavirus  (SARS-CoV-2) nucleic acids may be present in the submitted sample  additional confirmatory testing may be necessary for epidemiological  and / or clinical management purposes  to differentiate between  SARS-CoV-2 and other Sarbecovirus currently known to infect humans.  If clinically indicated additional testing with an alternate test  methodology [phone]) is advised. The SARS-CoV-2 RNA is generally  detectable in upper and lower respiratory sp ecimens during the acute  phase of infection. The expected result is Negative. Fact Sheet for Patients:  StrictlyIdeas.no Fact Sheet for Healthcare Providers: BankingDealers.co.za This test is not yet approved or cleared by the [place] FDA and has been authorized for detection and/or diagnosis of SARS-CoV-2 by FDA under an Emergency Use Authorization (EUA).  This EUA will remain in effect (meaning this test can be used) for the duration of the COVID-19 declaration under Section 564(b)(1) of the Act, 21 U.S.C. section 360bbb-3(b)(1), unless the authorization is terminated or revoked  sooner. Performed at [facility], [address],  [place], [place] [address]   MRSA PCR Screening     Status: None   Collection Time: 09/26/18  5:20 PM   Specimen: Nasal Mucosa; Nasopharyngeal  Result Value Ref Range Status   MRSA by PCR NEGATIVE NEGATIVE Final    Comment:        The GeneXpert MRSA Assay (FDA approved for NASAL specimens only), is one component of a comprehensive MRSA colonization surveillance program. It is not intended to diagnose MRSA infection nor to guide or monitor treatment for MRSA infections. Performed at [facility], [address], [place], [place] [address]   SARS Coronavirus 2 [facility] order, Performed in [facility] hospital lab) Nasopharyngeal Nasopharyngeal Swab     Status: None   Collection Time: 10/18/18  7:20 PM   Specimen: Nasopharyngeal Swab  Result Value Ref Range Status   SARS Coronavirus 2 NEGATIVE NEGATIVE Final    Comment: (NOTE) If result is NEGATIVE SARS-CoV-2 target nucleic acids are NOT DETECTED. The SARS-CoV-2 RNA is generally detectable in upper and lower  respiratory specimens during the acute phase of infection. The lowest  concentration of SARS-CoV-2 viral copies this assay can detect is 250  copies / mL. A negative result does not preclude SARS-CoV-2 infection  and should not be used as the sole basis for treatment or other  patient management decisions.  A negative result may occur with  improper specimen collection / handling, submission of specimen other  than nasopharyngeal swab, presence of viral mutation(s) within the  areas targeted by this assay, and inadequate number of viral copies  (<250 copies / mL). A negative result must be combined with clinical  observations, patient history, and epidemiological information. If result is POSITIVE SARS-CoV-2 target nucleic acids are DETECTED. The SARS-CoV-2 RNA is generally detectable in upper and lower  respiratory specimens dur ing the acute phase of infection.  Positive  results are indicative of active infection with  SARS-CoV-2.  Clinical  correlation with patient history and other diagnostic information is  necessary to determine patient infection status.  Positive results do  not rule out bacterial infection or co-infection with other viruses. If result is PRESUMPTIVE POSTIVE SARS-CoV-2 nucleic acids MAY BE PRESENT.   A presumptive positive result was obtained on the submitted specimen  and confirmed on repeat testing.  While 2019 novel coronavirus  (SARS-CoV-2) nucleic acids may be present in the submitted sample  additional confirmatory testing may be necessary for epidemiological  and / or clinical management purposes  to differentiate between  SARS-CoV-2 and other Sarbecovirus currently known to infect humans.  If clinically indicated additional testing with an alternate test  methodology [phone]) is advised. The SARS-CoV-2 RNA is generally  detectable in upper and lower respiratory sp ecimens during the acute  phase of infection. The expected result is Negative. Fact Sheet for Patients:  StrictlyIdeas.no Fact Sheet for Healthcare Providers: BankingDealers.co.za This test is not yet approved or cleared by the [place] FDA and has been authorized for detection and/or diagnosis of SARS-CoV-2 by FDA under an Emergency Use Authorization (EUA).  This EUA will remain in effect (meaning this test can be used) for the duration of the COVID-19 declaration under Section 564(b)(1) of the Act, 21 U.S.C. section 360bbb-3(b)(1), unless the authorization is terminated or revoked sooner. Performed at [facility], [address] [place], [place] [address]   Culture, blood (Routine X 2) w Reflex to ID Panel     Status: None (  Preliminary result)   Collection Time: 10/18/18 11:43 PM   Specimen: Left Antecubital; Blood  Result Value Ref Range Status   Specimen Description LEFT ANTECUBITAL  Final   Special Requests   Final    BOTTLES DRAWN AEROBIC AND  ANAEROBIC Blood Culture adequate volume   Culture   Final    NO GROWTH 4 DAYS Performed at [facility], [address] [place], [place] [address]    Report Status PENDING  Incomplete  Culture, blood (Routine X 2) w Reflex to ID Panel     Status: None (Preliminary result)   Collection Time: 10/19/18 12:00 AM   Specimen: BLOOD LEFT WRIST  Result Value Ref Range Status   Specimen Description BLOOD LEFT WRIST  Final   Special Requests   Final    BOTTLES DRAWN AEROBIC AND ANAEROBIC Blood Culture adequate volume   Culture   Final    NO GROWTH 4 DAYS Performed at [facility], [address], [place], [place] [address]    Report Status PENDING  Incomplete  MRSA PCR Screening     Status: None   Collection Time: 10/19/18  6:04 AM   Specimen: Nasal Mucosa; Nasopharyngeal  Result Value Ref Range Status   MRSA by PCR NEGATIVE NEGATIVE Final    Comment:        The GeneXpert MRSA Assay (FDA approved for NASAL specimens only), is one component of a comprehensive MRSA colonization surveillance program. It is not intended to diagnose MRSA infection nor to guide or monitor treatment for MRSA infections. Performed at [facility], [address], [place], [place] [address]   SARS Coronavirus 2 [facility] order, Performed in [facility] hospital lab) Nasopharyngeal Nasopharyngeal Swab     Status: None   Collection Time: 10/23/18 11:14 PM   Specimen: Nasopharyngeal Swab  Result Value Ref Range Status   SARS Coronavirus 2 NEGATIVE NEGATIVE Final    Comment: (NOTE) If result is NEGATIVE SARS-CoV-2 target nucleic acids are NOT DETECTED. The SARS-CoV-2 RNA is generally detectable in upper and lower  respiratory specimens during the acute phase of infection. The lowest  concentration of SARS-CoV-2 viral copies this assay can detect is 250  copies / mL. A negative result does not preclude SARS-CoV-2 infection  and should not be used as the sole basis for treatment or other  patient management  decisions.  A negative result may occur with  improper specimen collection / handling, submission of specimen other  than nasopharyngeal swab, presence of viral mutation(s) within the  areas targeted by this assay, and inadequate number of viral copies  (<250 copies / mL). A negative result must be combined with clinical  observations, patient history, and epidemiological information. If result is POSITIVE SARS-CoV-2 target nucleic acids are DETECTED. The SARS-CoV-2 RNA is generally detectable in upper and lower  respiratory specimens dur ing the acute phase of infection.  Positive  results are indicative of active infection with SARS-CoV-2.  Clinical  correlation with patient history and other diagnostic information is  necessary to determine patient infection status.  Positive results do  not rule out bacterial infection or co-infection with other viruses. If result is PRESUMPTIVE POSTIVE SARS-CoV-2 nucleic acids MAY BE PRESENT.   A presumptive positive result was obtained on the submitted specimen  and confirmed on repeat testing.  While 2019 novel coronavirus  (SARS-CoV-2) nucleic acids may be present in the submitted sample  additional confirmatory testing may be necessary for epidemiological  and / or clinical management purposes  to differentiate between  SARS-CoV-2 and other Sarbecovirus currently known to infect humans.  If clinically indicated additional testing with an alternate test  methodology [phone]) is advised. The SARS-CoV-2 RNA is generally  detectable in upper and lower respiratory sp ecimens during the acute  phase of infection. The expected result is Negative. Fact Sheet for Patients:  StrictlyIdeas.no Fact Sheet for Healthcare Providers: BankingDealers.co.za This test is not yet approved or cleared by the [place] FDA and has been authorized for detection and/or diagnosis of SARS-CoV-2 by FDA under an  Emergency Use Authorization (EUA).  This EUA will remain in effect (meaning this test can be used) for the duration of the COVID-19 declaration under Section 564(b)(1) of the Act, 21 U.S.C. section 360bbb-3(b)(1), unless the authorization is terminated or revoked sooner. Performed at [facility], [address], [place], [place] [address]     Medical History: Past Medical History:  Diagnosis Date  . Asthma   . Atrial flutter ([place])   . Cancer [facility])    Right breast  . COPD (chronic obstructive pulmonary disease) ([place])   . History of breast cancer    right breast  . Hypertension   . On home O2   . Type 2 diabetes mellitus (HCC)     Medications:  Anti-infectives (From admission, onward)   Start     Dose/Rate Route Frequency Ordered Stop   10/24/18 0245  vancomycin (VANCOCIN) 1,500 mg in sodium chloride 0.9 % 500 mL IVPB     1,500 mg 250 mL/hr over 120 Minutes Intravenous  Once 10/24/18 0224     10/24/18 0230  ceFEPIme (MAXIPIME) 2 g in sodium chloride 0.9 % 100 mL IVPB     2 g 200 mL/hr over 30 Minutes Intravenous  Once 10/24/18 0224        Assessment: 73 yo female with SOB and hx of COPD.  Was admitted last week, went home and returned with worsened breathing.  Pharmacy has been asked to dose cefepime and vancomycin for presumed PNA.  Goal of Therapy:  Vancomycin trough level 15-20 mcg/ml  Plan:  Preliminary review of pertinent patient information completed.  Protocol will be initiated with dose(s) of cefepime 2 grams x1 and vancomycin 20mg /kg=1500mg  loading dose.   Forestine Na clinical pharmacist will complete review during morning rounds to assess patient and finalize treatment regimen if needed.  Nyra Capes, RPH 10/24/2018,2:33 AM

## 2018-10-24 NOTE — Progress Notes (Signed)
Pharmacy Antibiotic Note  Eileen [name] is a 73 y.o. female admitted on 10/23/2018 with pneumonia.  Pharmacy has been consulted for Vancomycin and Cefepime dosing.  Plan: Vancomycin 1250 mg IV every 24 hours.  Goal trough 15-20 mcg/mL. Cefepime 2000 mg IV every 12 hours. Monitor labs, c/s, and vanco levels as indicated.  Height: 5\' 2"  (157.5 cm) Weight: 157 lb 10.1 oz (71.5 kg) IBW/kg (Calculated) : 50.1  Temp (24hrs), Avg:98.1 F (36.7 C), Min:97.8 F (36.6 C), Max:98.4 F (36.9 C)  Recent Labs  Lab 10/18/18 1834 10/20/18 0703 10/24/18 0000 10/24/18 0221  WBC 9.6 12.1* 11.5* 9.8  CREATININE 0.61 0.74 0.64 0.60    Estimated Creatinine Clearance: 58 mL/min (by C-G formula based on SCr of 0.6 mg/dL).    Allergies  Allergen Reactions  . Codeine Other (See Comments)    "jittery"    Antimicrobials this admission: Vanco 8/13 >>  Cefepime 8/13 >>   Dose adjustments this admission: Vanco/Cefepime  Microbiology results: 8/13 BCx: pending  8/13 MRSA PCR: pending  Thank you for allowing pharmacy to be a part of this patient's care.  Ramond Craver 10/24/2018 7:57 AM

## 2018-10-24 NOTE — ED Notes (Signed)
Patient took off nonrebreather placed on 5L via [place]. Tolerating well. RT in to give continuous neb.

## 2018-10-24 NOTE — ED Notes (Signed)
Patient on 5 L sats are 96%. Resting. Dr. Maudie Mercury in to see patient

## 2018-10-24 NOTE — TOC Initial Note (Addendum)
Transition of Care [facility]) - Initial/Assessment Note    Patient Details  Name: Eileen [name] MRN: [id number] Date of Birth: [birth year]  [facility]) CM/SW Contact:    Eileen Flood, LCSW Phone Number: 10/24/2018, 3:13 PM  Clinical Narrative:                  Pt admitted from home. Pt well known to TOC from previous admissions. Pt lives with her daughter. Spoke with pt's daughter by phone today to review pt's status at home. Per daughter, pt has all the DME she needs at home. Discussed [facility] referral again and daughter feels this would be beneficial.   [facility] CM has been following pt as well. From CM notes, it appears pt has been stating she does not have Xarelto or Trelegy Ellipta at home because she can't afford them. CM had arranged for samples for pt at PCP office. Discussed this with pt's daughter, Eileen [name]. Per IXL, pt has both of these medications at home. She states that she does not want it written in patient's record that pt cannot afford her medicine. Per Eileen [name], pt has good insurance and pt has 5 children who would help her pay for it if she somehow couldn't afford it. Will follow up with patient to further look into this concern.  Spoke with [facility] CM to update. If pt agreeable, will arrange [facility] RN and CSW at dc.  Will follow.  Expected Discharge Plan: [place] Barriers to Discharge: Continued Medical Work up   Patient Goals and CMS Choice        Expected Discharge Plan and Services Expected Discharge Plan: [place] In-house Referral: Clinical Social Work     Living arrangements for the past 2 months: [place]                                      Prior Living Arrangements/Services Living arrangements for the past 2 months: Single Family Home Lives with:: Adult Children Patient language and need for interpreter reviewed:: Yes Do you feel safe going back to the place where you live?: Yes      Need for  Family Participation in Patient Care: Yes (Comment) Care giver support system in place?: Yes (comment) Current home services: DME Criminal Activity/Legal Involvement Pertinent to Current Situation/Hospitalization: No - Comment as needed  Activities of Daily Living Home Assistive Devices/Equipment: CBG Meter, Dentures (specify type), Eyeglasses, Walker (specify type), Oxygen, Nebulizer ADL Screening (condition at time of admission) Patient's cognitive ability adequate to safely complete daily activities?: Yes Is the patient deaf or have difficulty hearing?: No Does the patient have difficulty seeing, even when wearing glasses/contacts?: No Does the patient have difficulty concentrating, remembering, or making decisions?: Yes Patient able to express need for assistance with ADLs?: Yes Does the patient have difficulty dressing or bathing?: No Independently performs ADLs?: Yes (appropriate for developmental age) Communication: Independent Dressing (OT): Independent Is this a change from baseline?: Pre-admission baseline Grooming: Independent Is this a change from baseline?: Pre-admission baseline Feeding: Independent Bathing: Independent Is this a change from baseline?: Pre-admission baseline Toileting: Independent Is this a change from baseline?: Pre-admission baseline In/Out Bed: Independent Is this a change from baseline?: Pre-admission baseline Walks in Home: Independent Is this a change from baseline?: Pre-admission baseline Does the patient have difficulty walking or climbing stairs?: Yes Weakness of Legs: Both Weakness  of Arms/Hands: Both  Permission Sought/Granted                  Emotional Assessment Appearance:: Appears stated age Attitude/Demeanor/Rapport: Engaged Affect (typically observed): Pleasant Orientation: : Oriented to Self, Oriented to Place, Oriented to  Time, Oriented to Situation Alcohol / Substance Use: Not Applicable Psych Involvement: No  (comment)  Admission diagnosis:  COPD exacerbation ([place]) [J44.1] Chest pain [R07.9] Acute on chronic respiratory failure with hypoxia ([place]) [J96.21] Patient Active Problem List   Diagnosis Date Noted  . Bronchiectasis with acute exacerbation ([place])   . COPD exacerbation ([place]) 10/18/2018  . Anemia 10/18/2018  . Osteoporosis 08/30/2018  . Closed compression fracture of body of L1 vertebra ([place])   . Constipation   . Acute and chronic respiratory failure (acute-on-chronic) ([place]) 08/10/2018  . Left lower lobe pneumonia ([place]) 08/10/2018  . Leukocytosis 08/10/2018  . Chronic respiratory failure with hypoxia ([place]) 06/28/2018  . Acute upper GI bleeding 01/20/2018  . Lower GI bleed 01/20/2018  . Acute respiratory failure with hypoxia ([place]) 09/17/2017  . Diabetes mellitus ([place]) 08/31/2017  . Acute hypoxemic respiratory failure ([place]) 08/31/2017  . Hemorrhoids 10/30/2016  . Atrial fibrillation with RVR ([place]) 09/24/2016  . Atrial flutter ([place]) 09/23/2016  . Obstructive chronic bronchitis with exacerbation ([place]) 05/23/2016  . Vitamin D deficiency 05/23/2016  . Acute on chronic respiratory failure ([place]) 03/24/2015  . Metabolic encephalopathy 28/76/8115  . HCAP (healthcare-associated pneumonia) 03/24/2015  . Anticoagulation management encounter   . New onset atrial flutter ([place]) 03/21/2015  . Obesity 03/21/2015  . Atrial flutter with rapid ventricular response ([place]) 03/21/2015  . COPD (chronic obstructive pulmonary disease) ([place]) 12/20/2014  . COPD with acute exacerbation ([place]) 12/20/2014  . 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 Breast cancer ([place]) 10/11/2009   PCP:  Eileen Lemons, PA Pharmacy:   [facility], [place] [address] Phone: [phone] Fax: [phone]     Social Determinants of Health (SDOH) Interventions    Readmission Risk Interventions Readmission  Risk Prevention Plan 10/24/2018 08/22/2018 08/21/2018  Transportation Screening Complete Complete Complete  PCP or Specialist Appt within 5-7 Days - Complete -  Home Care Screening - Complete Complete  Medication Review (RN CM) - Complete -  Medication Review (RN Care Manager) Complete - -  SW Recovery Care/Counseling Consult Complete - -  Some recent data might be hidden

## 2018-10-24 NOTE — Progress Notes (Signed)
ANTICOAGULATION CONSULT NOTE - Preliminary  Pharmacy Consult for Xarelto Indication: atrial fibrillation  Allergies  Allergen Reactions  . Codeine Other (See Comments)    "jittery"    Patient Measurements: Height: 5\' 2"  (157.5 cm) Weight: 154 lb (69.9 kg) IBW/kg (Calculated) : 50.1 HEPARIN DW (KG): 64.8   Vital Signs: Temp: 97.8 F (36.6 C) (08/13 0130) Temp Source: Oral (08/13 0130) BP: 136/67 (08/13 0130) Pulse Rate: 93 (08/13 0130)  Labs: Recent Labs    10/24/18 0000  HGB 11.1*  HCT 40.4  PLT 265  CREATININE 0.64   Estimated Creatinine Clearance: 57.3 mL/min (by C-G formula based on SCr of 0.64 mg/dL).  Medical History: Past Medical History:  Diagnosis Date  . Asthma   . Atrial flutter ([place])   . Cancer [facility])    Right breast  . COPD (chronic obstructive pulmonary disease) ([place])   . History of breast cancer    right breast  . Hypertension   . On home O2   . Type 2 diabetes mellitus (HCC)     Medications:  (Not in a hospital admission)   Assessment: 73 yo female with non-valvular atrial fibrillation.  Pharmacy has been asked to dose Xarelto.  Patient currently takes 20mg  at home and her last dose was 8/12 at lunch time.     Plan:  Continue PTA dose, Xarelton 20mg  at supper, will start 8/13 at 1700 Preliminary review of pertinent patient information completed.  Forestine Na clinical pharmacist will complete review during morning rounds to assess the patient and finalize treatment regimen.  Oralia Criger Scarlett, RPH 10/24/2018,2:37 AM

## 2018-10-24 NOTE — Progress Notes (Signed)
Per HPI: Eileen [name]  is a 73 y.o. female,  w Right breast cancer in remission,.L1 compression fracture s/p vertebroplasty, Anemia (iron def), Diverticulosis,Dm2, Aflutter, Moderate mitral regurgitation (06/27/2018), Copd (severe), chronic respiratory failure on 3.5L who was recently admitted for Copd exacerbation and pneumonia on 10/18/2018 and now presents with c/o dyspnea. Slight dry cough. No fever, no chills, no chest pain.   Patient was admitted for acute on chronic hypoxemic respiratory failure secondary to COPD exacerbation with H CAP.  She has been started on IV vancomycin and cefepime empirically as well as IV Solu-Medrol and has ongoing breathing treatments.  We will continue through today and wean antibiotics and Solu-Medrol over the next 24 to 48 hours.  Total care time: 25 minutes.

## 2018-10-24 NOTE — ED Notes (Signed)
Patient up to bedside commode next to bed, tachyphenic, short of breath on 4L. O2 initially read 53%. Placed back in bed SATs read 71%. Placed on nonrebreather SATs improved to mid 90's. Notified Dr. Waverly Ferrari.

## 2018-10-25 LAB — BASIC METABOLIC PANEL
Anion gap: 11 (ref 5–15)
BUN: 17 mg/dL (ref 8–23)
CO2: 36 mmol/L — ABNORMAL HIGH (ref 22–32)
Calcium: 9.4 mg/dL (ref 8.9–10.3)
Chloride: 91 mmol/L — ABNORMAL LOW (ref 98–111)
Creatinine, Ser: 0.62 mg/dL (ref 0.44–1.00)
GFR calc Af Amer: >60 mL/min (ref >60)
GFR calc non Af Amer: >60 mL/min (ref >60)
Glucose, Bld: 299 mg/dL — ABNORMAL HIGH (ref 70–99)
Potassium: 4.9 mmol/L (ref 3.5–5.1)
Sodium: 138 mmol/L (ref 135–145)

## 2018-10-25 LAB — GLUCOSE, CAPILLARY
Glucose-Capillary: 261 mg/dL — ABNORMAL HIGH (ref 70–99)
Glucose-Capillary: 283 mg/dL — ABNORMAL HIGH (ref 70–99)
Glucose-Capillary: 125 mg/dL — ABNORMAL HIGH (ref 70–99)
Glucose-Capillary: 242 mg/dL — ABNORMAL HIGH (ref 70–99)

## 2018-10-25 LAB — CBC
HCT: 38.1 % (ref 36.0–46.0)
Hemoglobin: 10.9 g/dL — ABNORMAL LOW (ref 12.0–15.0)
MCH: 28.4 pg (ref 26.0–34.0)
MCHC: 28.6 g/dL — ABNORMAL LOW (ref 30.0–36.0)
MCV: 99.2 fL (ref 80.0–100.0)
Platelets: 291 10*3/uL (ref 150–400)
RBC: 3.84 MIL/uL — ABNORMAL LOW (ref 3.87–5.11)
RDW: 13.6 % (ref 11.5–15.5)
WBC: 11.7 10*3/uL — ABNORMAL HIGH (ref 4.0–10.5)
nRBC: 0 % (ref 0.0–0.2)

## 2018-10-25 MED ORDER — INSULIN GLARGINE 100 UNIT/ML ~~LOC~~ SOLN
10.0000 [IU] | Freq: Every day | SUBCUTANEOUS | Status: DC
  Administered 2018-10-25 – 2018-10-28 (×4): 10 [IU] via SUBCUTANEOUS
  Filled 2018-10-25 (×5): qty 0.1

## 2018-10-25 MED ORDER — METHYLPREDNISOLONE SODIUM SUCC 40 MG IJ SOLR
40.0000 mg | Freq: Three times a day (TID) | INTRAMUSCULAR | Status: DC
  Administered 2018-10-25 – 2018-10-26 (×3): 40 mg via INTRAVENOUS
  Filled 2018-10-25 (×3): qty 1

## 2018-10-25 MED ORDER — INSULIN ASPART 100 UNIT/ML ~~LOC~~ SOLN
3.0000 [IU] | Freq: Three times a day (TID) | SUBCUTANEOUS | Status: DC
  Administered 2018-10-25 – 2018-10-28 (×9): 3 [IU] via SUBCUTANEOUS

## 2018-10-25 MED ORDER — INSULIN ASPART 100 UNIT/ML ~~LOC~~ SOLN
0.0000 [IU] | Freq: Three times a day (TID) | SUBCUTANEOUS | Status: DC
  Administered 2018-10-25: 17:00:00 2 [IU] via SUBCUTANEOUS
  Administered 2018-10-25: 8 [IU] via SUBCUTANEOUS
  Administered 2018-10-26: 3 [IU] via SUBCUTANEOUS
  Administered 2018-10-26: 09:00:00 5 [IU] via SUBCUTANEOUS
  Administered 2018-10-26 – 2018-10-28 (×3): 3 [IU] via SUBCUTANEOUS

## 2018-10-25 MED ORDER — INSULIN ASPART 100 UNIT/ML ~~LOC~~ SOLN
0.0000 [IU] | Freq: Every day | SUBCUTANEOUS | Status: DC
  Administered 2018-10-25 – 2018-10-27 (×2): 2 [IU] via SUBCUTANEOUS

## 2018-10-25 MED ORDER — HALOPERIDOL LACTATE 5 MG/ML IJ SOLN
0.5000 mg | Freq: Four times a day (QID) | INTRAMUSCULAR | Status: DC | PRN
  Administered 2018-10-26 (×2): 0.5 mg via INTRAVENOUS
  Filled 2018-10-25 (×2): qty 1

## 2018-10-25 NOTE — TOC Progression Note (Signed)
Transition of Care [facility]) - Progression Note    Patient Details  Name: Eileen [name] MRN: [id number] Date of Birth: Jun 12, 1945  Transition of Care [facility]) CM/SW Contact  Shade Flood, LCSW Phone Number: 10/25/2018, 11:25 AM  Clinical Narrative:     TOC following. Discussed pt with [place], Anderson Malta, and requested assistance determining the last time pt filled her Xarelto and Trelegy prescriptions at her pharmacy. Anderson Malta contacted pt's pharmacy and was informed that pt last filled them in May 2020. Copay for Xarelto is currently $74.13 and Trelegy is $90.39.   Updated [facility] CM, Almyra Free, that Anderson Malta is going to reach out to [facility] pharmacist, Lottie Dawson, to suggest getting pt switched to alternative medications that have more generous patient assistance programs that pt might qualify for. This will likely be a process involving pt's PCP team as well. In the meantime, pt can pickup some samples of the medicines she cannot afford at her PCP office.   Updated pt and her daughter. Pt will dc home with [facility] for RN, SW and possibly PT. MD anticipating pt will dc home over the weekend. Referred HH needs to [facility] with [facility].   Weekend TOC will be available if any other needs arise.   Expected Discharge Plan: [place] Barriers to Discharge: Continued Medical Work up  Expected Discharge Plan and Services Expected Discharge Plan: [place] In-house Referral: Clinical Social Work     Living arrangements for the past 2 months: Single Family Home                                       Social Determinants of Health (SDOH) Interventions    Readmission Risk Interventions Readmission Risk Prevention Plan 10/24/2018 08/22/2018 08/21/2018  Transportation Screening Complete Complete Complete  PCP or Specialist Appt within 5-7 Days - Complete -  Home Care Screening - Complete Complete  Medication Review (RN CM) - Complete -   Medication Review Press photographer) Complete - -  SW Recovery Care/Counseling Consult Complete - -  Some recent data might be hidden

## 2018-10-25 NOTE — Progress Notes (Signed)
PROGRESS NOTE    Eileen [name]  KGY:[id number] DOB: 15-Apr-1945 DOA: 10/23/2018 PCP: Lavella Lemons, PA   Brief Narrative:  Per HPI: [name],wRight breast cancer in remission,.L1 compression fracture s/p vertebroplasty, Anemia (iron def), Diverticulosis,Dm2, Aflutter, Moderate mitral regurgitation (06/27/2018), Copd (severe), chronic respiratory failure on 3.5Lwho was recently admitted for Copd exacerbation and pneumonia on 10/18/2018 and now presents with c/o dyspnea.Slight dry cough. No fever, no chills, no chest pain.  Patient was admitted for acute on chronic hypoxemic respiratory failure secondary to COPD exacerbation with H CAP.  She has been started on IV vancomycin and cefepime empirically and her MRSA screen has returned negative and therefore IV vancomycin will be discontinued on 8/14.  She appears to overall be doing some better this morning and is currently on her baseline nasal cannula oxygen requirements of 3 L this morning.  We will plan to decrease steroid use today.   Assessment & Plan:   Principal Problem:   COPD exacerbation ([place]) Active Problems:   Atrial flutter (HCC)   Atrial fibrillation with RVR (HCC)   Diabetes mellitus ([place])   Acute on chronic hypoxemic respiratory failure secondary to COPD exacerbation with H CAP -Plan to wean IV steroids to 40 mg 3 times daily today -Continue on IV cefepime and discontinue vancomycin -Continue breathing treatments every 6 hours -Urine strep pneumonia antigen negative with Legionella pending -Blood cultures negative x1 day  Atrial flutter with moderate MR -Continue amiodarone, Lopressor, and Xarelto  Edema-improved -Continue Lasix 60 mg p.o. daily and hold potassium today due to some hyperkalemia -Recheck labs in a.m.  Type 2 diabetes with steroid-induced hyperglycemia -Hemoglobin A1c 8.5% on 09/2018 -Continue metformin -Increase SSI and add mealtime coverage -Add Lantus 10  units daily per coordinator recommendations while on steroids  Chronic anemia-stable -History of diverticulosis noted, but with no overt bleeding  History of L1 compression fracture status post vertebroplasty -Follow-up with PCP and will require regular bone density testing as well as anti-resorptive therapy   DVT prophylaxis: Xarelto Code Status: Full Family Communication: None at bedside Disposition Plan: Plan for discharge hopefully in the next 24 hours to home with home health PT and RN as well as Education officer, museum.  Has home oxygen.   Consultants:   None  Procedures:   None  Antimicrobials:  Anti-infectives (From admission, onward)   Start     Dose/Rate Route Frequency Ordered Stop   10/25/18 0500  vancomycin (VANCOCIN) 1,250 mg in sodium chloride 0.9 % 250 mL IVPB  Status:  Discontinued     1,250 mg 166.7 mL/hr over 90 Minutes Intravenous Every 24 hours 10/24/18 0758 10/25/18 0858   10/24/18 1600  ceFEPIme (MAXIPIME) 2 g in sodium chloride 0.9 % 100 mL IVPB     2 g 200 mL/hr over 30 Minutes Intravenous Every 12 hours 10/24/18 0755     10/24/18 0245  vancomycin (VANCOCIN) 1,500 mg in sodium chloride 0.9 % 500 mL IVPB     1,500 mg 250 mL/hr over 120 Minutes Intravenous  Once 10/24/18 0224 10/24/18 0516   10/24/18 0230  ceFEPIme (MAXIPIME) 2 g in sodium chloride 0.9 % 100 mL IVPB     2 g 200 mL/hr over 30 Minutes Intravenous  Once 10/24/18 0224 10/24/18 0315        Subjective: Patient seen and evaluated today with no new acute complaints or concerns. No acute concerns or events noted overnight.  She states she has less chest tightness and shortness of breath noted  today.  She is currently on 3 L nasal cannula.  Objective: Vitals:   10/25/18 0605 10/25/18 0716 10/25/18 0732 10/25/18 0735  BP: 123/67     Pulse: 73     Resp: 19     Temp: 98 F (36.7 C)     TempSrc: Oral     SpO2: 96% 98% 98% 98%  Weight:      Height:        Intake/Output Summary (Last 24 hours)  at 10/25/2018 1237 Last data filed at 10/25/2018 0800 Gross per 24 hour  Intake 440 ml  Output 1000 ml  Net -560 ml   Filed Weights   10/23/18 2213 10/24/18 0500 10/25/18 0415  Weight: 69.9 kg 71.5 kg 73.3 kg    Examination:  General exam: Appears calm and comfortable  Respiratory system: Clear to auscultation. Respiratory effort normal.  No wheezing noted.  3 L nasal cannula. Cardiovascular system: S1 & S2 heard, RRR. No JVD, murmurs, rubs, gallops or clicks. No pedal edema. Gastrointestinal system: Abdomen is nondistended, soft and nontender. No organomegaly or masses felt. Normal bowel sounds heard. Central nervous system: Alert and oriented. No focal neurological deficits. Extremities: Symmetric 5 x 5 power. Skin: No rashes, lesions or ulcers Psychiatry: Judgement and insight appear normal. Mood & affect appropriate.     Data Reviewed: I have personally reviewed following labs and imaging studies  CBC: Recent Labs  Lab 10/18/18 1834 10/20/18 0703 10/24/18 0000 10/24/18 0221 10/25/18 0536  WBC 9.6 12.1* 11.5* 9.8 11.7*  NEUTROABS 7.8*  --  9.0*  --   --   HGB 11.5* 11.0* 11.1* 11.1* 10.9*  HCT 41.9 38.3 40.4 40.1 38.1  MCV 101.9* 97.5 100.2* 101.3* 99.2  PLT 232 259 265 254 917   Basic Metabolic Panel: Recent Labs  Lab 10/18/18 1834 10/20/18 0703 10/24/18 0000 10/24/18 0221 10/25/18 0536  NA 142 142 143 145 138  K 5.0 4.7 4.3 3.7 4.9  CL 94* 91* 93* 92* 91*  CO2 37* 38* 41* 39* 36*  GLUCOSE 291* 238* 201* 184* 299*  BUN 12 21 19 17 17   CREATININE 0.61 0.74 0.64 0.60 0.62  CALCIUM 9.6 9.5 9.4 9.4 9.4   GFR: Estimated Creatinine Clearance: 58.7 mL/min (by C-G formula based on SCr of 0.62 mg/dL). Liver Function Tests: Recent Labs  Lab 10/24/18 0221  AST 15  ALT 21  ALKPHOS 76  BILITOT 0.7  PROT 6.6  ALBUMIN 3.9   No results for input(s): LIPASE, AMYLASE in the last 168 hours. No results for input(s): AMMONIA in the last 168 hours. Coagulation  Profile: No results for input(s): INR, PROTIME in the last 168 hours. Cardiac Enzymes: No results for input(s): CKTOTAL, CKMB, CKMBINDEX, TROPONINI in the last 168 hours. BNP (last 3 results) No results for input(s): PROBNP in the last 8760 hours. HbA1C: No results for input(s): HGBA1C in the last 72 hours. CBG: Recent Labs  Lab 10/24/18 1109 10/24/18 1620 10/24/18 2055 10/25/18 0739 10/25/18 1141  GLUCAP 277* 298* 263* 261* 283*   Lipid Profile: No results for input(s): CHOL, HDL, LDLCALC, TRIG, CHOLHDL, LDLDIRECT in the last 72 hours. Thyroid Function Tests: No results for input(s): TSH, T4TOTAL, FREET4, T3FREE, THYROIDAB in the last 72 hours. Anemia Panel: No results for input(s): VITAMINB12, FOLATE, FERRITIN, TIBC, IRON, RETICCTPCT in the last 72 hours. Sepsis Labs: No results for input(s): PROCALCITON, LATICACIDVEN in the last 168 hours.  Recent Results (from the past 240 hour(s))  SARS Coronavirus 2 [facility] order,  Performed in [facility] hospital lab) Nasopharyngeal Nasopharyngeal Swab     Status: None   Collection Time: 10/18/18  7:20 PM   Specimen: Nasopharyngeal Swab  Result Value Ref Range Status   SARS Coronavirus 2 NEGATIVE NEGATIVE Final    Comment: (NOTE) If result is NEGATIVE SARS-CoV-2 target nucleic acids are NOT DETECTED. The SARS-CoV-2 RNA is generally detectable in upper and lower  respiratory specimens during the acute phase of infection. The lowest  concentration of SARS-CoV-2 viral copies this assay can detect is 250  copies / mL. A negative result does not preclude SARS-CoV-2 infection  and should not be used as the sole basis for treatment or other  patient management decisions.  A negative result may occur with  improper specimen collection / handling, submission of specimen other  than nasopharyngeal swab, presence of viral mutation(s) within the  areas targeted by this assay, and inadequate number of viral copies  (<250 copies / mL). A  negative result must be combined with clinical  observations, patient history, and epidemiological information. If result is POSITIVE SARS-CoV-2 target nucleic acids are DETECTED. The SARS-CoV-2 RNA is generally detectable in upper and lower  respiratory specimens dur ing the acute phase of infection.  Positive  results are indicative of active infection with SARS-CoV-2.  Clinical  correlation with patient history and other diagnostic information is  necessary to determine patient infection status.  Positive results do  not rule out bacterial infection or co-infection with other viruses. If result is PRESUMPTIVE POSTIVE SARS-CoV-2 nucleic acids MAY BE PRESENT.   A presumptive positive result was obtained on the submitted specimen  and confirmed on repeat testing.  While 2019 novel coronavirus  (SARS-CoV-2) nucleic acids may be present in the submitted sample  additional confirmatory testing may be necessary for epidemiological  and / or clinical management purposes  to differentiate between  SARS-CoV-2 and other Sarbecovirus currently known to infect humans.  If clinically indicated additional testing with an alternate test  methodology [phone]) is advised. The SARS-CoV-2 RNA is generally  detectable in upper and lower respiratory sp ecimens during the acute  phase of infection. The expected result is Negative. Fact Sheet for Patients:  StrictlyIdeas.no Fact Sheet for Healthcare Providers: BankingDealers.co.za This test is not yet approved or cleared by the [place] FDA and has been authorized for detection and/or diagnosis of SARS-CoV-2 by FDA under an Emergency Use Authorization (EUA).  This EUA will remain in effect (meaning this test can be used) for the duration of the COVID-19 declaration under Section 564(b)(1) of the Act, 21 U.S.C. section 360bbb-3(b)(1), unless the authorization is terminated or revoked sooner. Performed  at [facility], [address] [place], [place] [address]   Culture, blood (Routine X 2) w Reflex to ID Panel     Status: None   Collection Time: 10/18/18 11:43 PM   Specimen: Left Antecubital; Blood  Result Value Ref Range Status   Specimen Description LEFT ANTECUBITAL  Final   Special Requests   Final    BOTTLES DRAWN AEROBIC AND ANAEROBIC Blood Culture adequate volume   Culture   Final    NO GROWTH 5 DAYS Performed at [facility], [address], [place], [place] [address]    Report Status 10/24/2018 FINAL  Final  Culture, blood (Routine X 2) w Reflex to ID Panel     Status: None   Collection Time: 10/19/18 12:00 AM   Specimen: BLOOD LEFT WRIST  Result Value Ref Range Status  Specimen Description BLOOD LEFT WRIST  Final   Special Requests   Final    BOTTLES DRAWN AEROBIC AND ANAEROBIC Blood Culture adequate volume   Culture   Final    NO GROWTH 5 DAYS Performed at [facility], [address], [place], [place] [address]    Report Status 10/24/2018 FINAL  Final  MRSA PCR Screening     Status: None   Collection Time: 10/19/18  6:04 AM   Specimen: Nasal Mucosa; Nasopharyngeal  Result Value Ref Range Status   MRSA by PCR NEGATIVE NEGATIVE Final    Comment:        The GeneXpert MRSA Assay (FDA approved for NASAL specimens only), is one component of a comprehensive MRSA colonization surveillance program. It is not intended to diagnose MRSA infection nor to guide or monitor treatment for MRSA infections. Performed at [facility], [address] [place], [place] [address]   SARS Coronavirus 2 [facility] order, Performed in [facility] hospital lab) Nasopharyngeal Nasopharyngeal Swab     Status: None   Collection Time: 10/23/18 11:14 PM   Specimen: Nasopharyngeal Swab  Result Value Ref Range Status   SARS Coronavirus 2 NEGATIVE NEGATIVE Final    Comment: (NOTE) If result is NEGATIVE SARS-CoV-2 target nucleic acids are NOT DETECTED. The SARS-CoV-2 RNA is generally  detectable in upper and lower  respiratory specimens during the acute phase of infection. The lowest  concentration of SARS-CoV-2 viral copies this assay can detect is 250  copies / mL. A negative result does not preclude SARS-CoV-2 infection  and should not be used as the sole basis for treatment or other  patient management decisions.  A negative result may occur with  improper specimen collection / handling, submission of specimen other  than nasopharyngeal swab, presence of viral mutation(s) within the  areas targeted by this assay, and inadequate number of viral copies  (<250 copies / mL). A negative result must be combined with clinical  observations, patient history, and epidemiological information. If result is POSITIVE SARS-CoV-2 target nucleic acids are DETECTED. The SARS-CoV-2 RNA is generally detectable in upper and lower  respiratory specimens dur ing the acute phase of infection.  Positive  results are indicative of active infection with SARS-CoV-2.  Clinical  correlation with patient history and other diagnostic information is  necessary to determine patient infection status.  Positive results do  not rule out bacterial infection or co-infection with other viruses. If result is PRESUMPTIVE POSTIVE SARS-CoV-2 nucleic acids MAY BE PRESENT.   A presumptive positive result was obtained on the submitted specimen  and confirmed on repeat testing.  While 2019 novel coronavirus  (SARS-CoV-2) nucleic acids may be present in the submitted sample  additional confirmatory testing may be necessary for epidemiological  and / or clinical management purposes  to differentiate between  SARS-CoV-2 and other Sarbecovirus currently known to infect humans.  If clinically indicated additional testing with an alternate test  methodology [phone]) is advised. The SARS-CoV-2 RNA is generally  detectable in upper and lower respiratory sp ecimens during the acute  phase of infection. The  expected result is Negative. Fact Sheet for Patients:  StrictlyIdeas.no Fact Sheet for Healthcare Providers: BankingDealers.co.za This test is not yet approved or cleared by the [place] FDA and has been authorized for detection and/or diagnosis of SARS-CoV-2 by FDA under an Emergency Use Authorization (EUA).  This EUA will remain in effect (meaning this test can be used) for the duration of the COVID-19 declaration under Section  564(b)(1) of the Act, 21 U.S.C. section 360bbb-3(b)(1), unless the authorization is terminated or revoked sooner. Performed at [facility], [address], [place], [place] [address]   Culture, blood (Routine X 2) w Reflex to ID Panel     Status: None (Preliminary result)   Collection Time: 10/24/18  2:21 AM   Specimen: BLOOD  Result Value Ref Range Status   Specimen Description BLOOD  Final   Special Requests NONE  Final   Culture   Final    NO GROWTH 1 DAY Performed at [facility], [address] [place], [place] [address]    Report Status PENDING  Incomplete  Culture, blood (Routine X 2) w Reflex to ID Panel     Status: None (Preliminary result)   Collection Time: 10/24/18  2:27 AM   Specimen: BLOOD  Result Value Ref Range Status   Specimen Description BLOOD  Final   Special Requests NONE  Final   Culture   Final    NO GROWTH 1 DAY Performed at [facility], [address], [place], [place] [address]    Report Status PENDING  Incomplete  MRSA PCR Screening     Status: None   Collection Time: 10/24/18  7:57 AM   Specimen: Nasopharyngeal  Result Value Ref Range Status   MRSA by PCR NEGATIVE NEGATIVE Final    Comment:        The GeneXpert MRSA Assay (FDA approved for NASAL specimens only), is one component of a comprehensive MRSA colonization surveillance program. It is not intended to diagnose MRSA infection nor to guide or monitor treatment for MRSA infections. Performed at [facility], [address], [place], [place] [address]          Radiology Studies: Dg Chest [facility] 1 View  Result Date: 10/23/2018 CLINICAL DATA:  Shortness of breath EXAM: PORTABLE CHEST 1 VIEW COMPARISON:  October 18, 2018 FINDINGS: There is slight interval worsening in the patchy airspace opacities seen at both lung bases. There is mildly increased interstitial markings seen throughout both lungs. The cardiomediastinal silhouette is unchanged. No acute osseous abnormality. IMPRESSION: Slight interval worsening in the patchy airspace opacities in both lung bases which could be due to edema, atelectasis, and/or infectious etiology. Electronically Signed   By: Prudencio Pair M.D.   On: 10/23/2018 23:28        Scheduled Meds: . albuterol  2.5 mg Nebulization Q6H  . amiodarone  200 mg Oral Daily  . docusate sodium  100 mg Oral Daily  . fluticasone furoate-vilanterol  1 puff Inhalation Daily   And  . umeclidinium bromide  1 puff Inhalation Daily  . furosemide  60 mg Oral Daily  . guaiFENesin  600 mg Oral BID  . insulin aspart  0-15 Units Subcutaneous TID WC  . insulin aspart  0-5 Units Subcutaneous QHS  . insulin aspart  3 Units Subcutaneous TID WC  . metFORMIN  500 mg Oral Q breakfast  . methylPREDNISolone (SOLU-MEDROL) injection  40 mg Intravenous Q8H  . metoprolol tartrate  50 mg Oral BID  . rivaroxaban  20 mg Oral Q supper  . sodium chloride flush  3 mL Intravenous Q12H   Continuous Infusions: . sodium chloride    . ceFEPime (MAXIPIME) IV 200 mL/hr at 10/25/18 0300     LOS: 1 day    Time spent: 30 minutes    Arick Mareno Darleen Crocker, DO [facility] Pager [phone]  If 7PM-7AM, please contact night-coverage [email or web address] Password [facility] 10/25/2018, 12:37 PM

## 2018-10-25 NOTE — Care Management Important Message (Signed)
Important Message  Patient Details  Name: [name] Lograsso MRN: [id number] Date of Birth: [birth year]   Medicare Important Message Given:        Shade Flood, LCSW 10/25/2018, 12:17 PM

## 2018-10-25 NOTE — Progress Notes (Signed)
Inpatient Diabetes Program Recommendations  AACE/ADA: New Consensus Statement on Inpatient Glycemic Control (2015)  Target Ranges:  Prepandial:   less than 140 mg/dL      Peak postprandial:   less than 180 mg/dL (1-2 hours)      Critically ill patients:  140 - 180 mg/dL   Lab Results  Component Value Date   GLUCAP 261 (H) 10/25/2018   HGBA1C 8.5 (H) 09/29/2018    Review of Glycemic Control  Diabetes history: DM2 Outpatient Diabetes medications: metformin 500 mg QAM Current orders for Inpatient glycemic control: Novolog 0-9 units tidwc and 0-5 units QHS, metformin 500 mg QAM  HgbA1C - 8.5% On Solumedrol 80 mg Q8H Blood sugars > goal of less than 180 mg/dL. May benefit from addition of small amount basal insulin.  Inpatient Diabetes Program Recommendations:     Add Lantus 10 units QD Increase Novolog to 0-15 units tidwc and 0-5 units QHS  Will follow closely.  Thank you. Lorenda Peck, RD, LDN, CDE Inpatient Diabetes Coordinator [phone]

## 2018-10-26 ENCOUNTER — Inpatient Hospital Stay (HOSPITAL_COMMUNITY): Payer: Medicare Other

## 2018-10-26 LAB — BASIC METABOLIC PANEL
Anion gap: 13 (ref 5–15)
BUN: 20 mg/dL (ref 8–23)
CO2: 35 mmol/L — ABNORMAL HIGH (ref 22–32)
Calcium: 10.1 mg/dL (ref 8.9–10.3)
Chloride: 91 mmol/L — ABNORMAL LOW (ref 98–111)
Creatinine, Ser: 0.67 mg/dL (ref 0.44–1.00)
GFR calc Af Amer: >60 mL/min (ref >60)
GFR calc non Af Amer: >60 mL/min (ref >60)
Glucose, Bld: 233 mg/dL — ABNORMAL HIGH (ref 70–99)
Potassium: 4.7 mmol/L (ref 3.5–5.1)
Sodium: 139 mmol/L (ref 135–145)

## 2018-10-26 LAB — LEGIONELLA PNEUMOPHILA SEROGP 1 UR AG: L. pneumophila Serogp 1 Ur Ag: NEGATIVE

## 2018-10-26 LAB — GLUCOSE, CAPILLARY
Glucose-Capillary: 208 mg/dL — ABNORMAL HIGH (ref 70–99)
Glucose-Capillary: 193 mg/dL — ABNORMAL HIGH (ref 70–99)
Glucose-Capillary: 157 mg/dL — ABNORMAL HIGH (ref 70–99)
Glucose-Capillary: 118 mg/dL — ABNORMAL HIGH (ref 70–99)

## 2018-10-26 LAB — URINALYSIS, ROUTINE W REFLEX MICROSCOPIC
Bilirubin Urine: NEGATIVE
Glucose, UA: 150 mg/dL — AB
Hgb urine dipstick: NEGATIVE
Ketones, ur: NEGATIVE mg/dL
Leukocytes,Ua: NEGATIVE
Nitrite: NEGATIVE
Protein, ur: 100 mg/dL — AB
Specific Gravity, Urine: 1.026 (ref 1.005–1.030)
pH: 5 (ref 5.0–8.0)

## 2018-10-26 LAB — AMMONIA: Ammonia: 17 umol/L (ref 9–35)

## 2018-10-26 LAB — TSH: TSH: 0.831 u[IU]/mL (ref 0.350–4.500)

## 2018-10-26 MED ORDER — QUETIAPINE FUMARATE 25 MG PO TABS: 25.0000 mg | ORAL_TABLET | Freq: Two times a day (BID) | ORAL | Status: DC | PRN

## 2018-10-26 MED ORDER — QUETIAPINE FUMARATE 25 MG PO TABS
25.0000 mg | ORAL_TABLET | Freq: Every day | ORAL | Status: DC
  Administered 2018-10-26 – 2018-10-27 (×3): 25 mg via ORAL
  Filled 2018-10-26 (×3): qty 1

## 2018-10-26 MED ORDER — METHYLPREDNISOLONE SODIUM SUCC 40 MG IJ SOLR
40.0000 mg | Freq: Two times a day (BID) | INTRAMUSCULAR | Status: DC
  Administered 2018-10-26: 16:00:00 40 mg via INTRAVENOUS
  Filled 2018-10-26: qty 1

## 2018-10-26 NOTE — Progress Notes (Addendum)
Patient has been confused since beginning of shift. Per hand off report, patient has been confused in daytime as well.   Patient has been making numerous phone calls to daughter, making confusing statements.  Daughter called and voiced concerns over patient confusion.  Patient daughter states patient is intermittently confused at times at home, and does not feel comfortable with patient coming home if confused.  Patient is able to have conversation.  Attempted several times in night to reorient patient, but patient does have difficulty maintaining belief she is in the hospital, her family is not present. Daughter is questioning if urine can be tested.

## 2018-10-26 NOTE — Progress Notes (Signed)
PROGRESS NOTE    Eileen [name]  ZHG:[id number] DOB: 1945-08-16 DOA: 10/23/2018 PCP: Lavella Lemons, PA   Brief Narrative:  Per HPI: [name],wRight breast cancer in remission,.L1 compression fracture s/p vertebroplasty, Anemia (iron def), Diverticulosis,Dm2, Aflutter, Moderate mitral regurgitation (06/27/2018), Copd (severe), chronic respiratory failure on 3.5Lwho was recently admitted for Copd exacerbation and pneumonia on 10/18/2018 and now presents with c/o dyspnea.Slight dry cough. No fever, no chills, no chest pain.  Patient was admitted for acute on chronic hypoxemic respiratory failure secondary to COPD exacerbation with H CAP.  She has been started on IV vancomycin and cefepime empirically and her MRSA screen has returned negative and therefore IV vancomycin will be discontinued on 8/14.  She appears to overall be doing some better this morning and is currently on her baseline nasal cannula oxygen requirements of 3 L this morning.  We will plan to decrease steroid use today.  8/15: Patient has had some episodes of agitation and confusion during this hospitalization which has worsened overnight.  Daughter states that this is been ongoing for several weeks at home as well.  She will require further evaluation with urine analysis as well as CT head and other studies as ordered.  She appears to be doing better from a respiratory standpoint, however.  Assessment & Plan:   Principal Problem:   COPD exacerbation ([place]) Active Problems:   Atrial flutter ([facility])   Atrial fibrillation with RVR ([facility])   Diabetes mellitus ([place])   Acute on chronic hypoxemic respiratory failure secondary to COPD exacerbation with H CAP-improving -Plan to wean IV steroids to 40 mg to BID today -Continue on IV cefepime for now -Continue breathing treatments only as needed for dyspnea/wheezing -Urine strep pneumonia antigen negative with Legionella negative as well -Blood  cultures negative x2 day  Acute encephalopathy -Periods of confusion and agitation noted which likely may be related to delirium/early dementia -We will obtain urine analysis -Obtain CT head -Continue Haldol as needed -Seroquel started at bedtime today  Atrial flutter with moderate MR -Continue amiodarone, Lopressor, and Xarelto  Edema-improved -Continue Lasix 60 mg p.o. daily and hold potassium today due to some hyperkalemia -Recheck labs in a.m.  Type 2 diabetes with steroid-induced hyperglycemia -Hemoglobin A1c 8.5% on 09/2018 -Continue metformin -Continue SSI mealtime coverage -Continue Lantus 10 units daily for now -Plan to wean steroids further which should help  Chronic anemia-stable -History of diverticulosis noted, but with no overt bleeding  History of L1 compression fracture status post vertebroplasty -Follow-up with PCP and will require regular bone density testing as well as anti-resorptive therapy   DVT prophylaxis: Xarelto Code Status: Full Family Communication:  Spoke with daughter [name] on phone Disposition Plan: Plan for discharge hopefully in the next 24 hours to home with home health PT and RN as well as Education officer, [employer].  Has home oxygen.  Will need further evaluation at this time for confusion/encephalopathy.   Consultants:   None  Procedures:   None  Antimicrobials:  Anti-infectives (From admission, onward)   Start     Dose/Rate Route Frequency Ordered Stop   10/25/18 0500  vancomycin (VANCOCIN) 1,250 mg in sodium chloride 0.9 % 250 mL IVPB  Status:  Discontinued     1,250 mg 166.7 mL/hr over 90 Minutes Intravenous Every 24 hours 10/24/18 0758 10/25/18 0858   10/24/18 1600  ceFEPIme (MAXIPIME) 2 g in sodium chloride 0.9 % 100 mL IVPB     2 g 200 mL/hr over 30 Minutes Intravenous Every 12  hours 10/24/18 0755     10/24/18 0245  vancomycin (VANCOCIN) 1,500 mg in sodium chloride 0.9 % 500 mL IVPB     1,500 mg 250 mL/hr over 120 Minutes  Intravenous  Once 10/24/18 0224 10/24/18 0516   10/24/18 0230  ceFEPIme (MAXIPIME) 2 g in sodium chloride 0.9 % 100 mL IVPB     2 g 200 mL/hr over 30 Minutes Intravenous  Once 10/24/18 0224 10/24/18 0315       Subjective: Patient seen and evaluated today with overall improvements in her shortness of breath, but she is noted to have some ongoing confusion and agitation that began overnight.  Objective: Vitals:   10/26/18 0524 10/26/18 0808 10/26/18 0817 10/26/18 0832  BP: 130/72   125/67  Pulse: 78   92  Resp: 18     Temp: 98.3 F (36.8 C)     TempSrc: Oral     SpO2: 91% 91% 94%   Weight:      Height:        Intake/Output Summary (Last 24 hours) at 10/26/2018 1308 Last data filed at 10/26/2018 0850 Gross per 24 hour  Intake 840 ml  Output 350 ml  Net 490 ml   Filed Weights   10/24/18 0500 10/25/18 0415 10/26/18 0500  Weight: 71.5 kg 73.3 kg 73.3 kg    Examination:  General exam: Appears calm and comfortable, minimally confused Respiratory system: Clear to auscultation. Respiratory effort normal.  Currently on nasal cannula oxygen 3 L Cardiovascular system: S1 & S2 heard, RRR. No JVD, murmurs, rubs, gallops or clicks. No pedal edema. Gastrointestinal system: Abdomen is nondistended, soft and nontender. No organomegaly or masses felt. Normal bowel sounds heard. Central nervous system: Alert and awake. Extremities: Symmetric 5 x 5 power. Skin: No rashes, lesions or ulcers Psychiatry: Appears confused and hyperactive.    Data Reviewed: I have personally reviewed following labs and imaging studies  CBC: Recent Labs  Lab 10/20/18 0703 10/24/18 0000 10/24/18 0221 10/25/18 0536  WBC 12.1* 11.5* 9.8 11.7*  NEUTROABS  --  9.0*  --   --   HGB 11.0* 11.1* 11.1* 10.9*  HCT 38.3 40.4 40.1 38.1  MCV 97.5 100.2* 101.3* 99.2  PLT 259 265 254 998   Basic Metabolic Panel: Recent Labs  Lab 10/20/18 0703 10/24/18 0000 10/24/18 0221 10/25/18 0536 10/26/18 0650  NA 142  143 145 138 139  K 4.7 4.3 3.7 4.9 4.7  CL 91* 93* 92* 91* 91*  CO2 38* 41* 39* 36* 35*  GLUCOSE 238* 201* 184* 299* 233*  BUN 21 19 17 17 20   CREATININE 0.74 0.64 0.60 0.62 0.67  CALCIUM 9.5 9.4 9.4 9.4 10.1   GFR: Estimated Creatinine Clearance: 58.7 mL/min (by C-G formula based on SCr of 0.67 mg/dL). Liver Function Tests: Recent Labs  Lab 10/24/18 0221  AST 15  ALT 21  ALKPHOS 76  BILITOT 0.7  PROT 6.6  ALBUMIN 3.9   No results for input(s): LIPASE, AMYLASE in the last 168 hours. No results for input(s): AMMONIA in the last 168 hours. Coagulation Profile: No results for input(s): INR, PROTIME in the last 168 hours. Cardiac Enzymes: No results for input(s): CKTOTAL, CKMB, CKMBINDEX, TROPONINI in the last 168 hours. BNP (last 3 results) No results for input(s): PROBNP in the last 8760 hours. HbA1C: No results for input(s): HGBA1C in the last 72 hours. CBG: Recent Labs  Lab 10/25/18 1141 10/25/18 1644 10/25/18 2034 10/26/18 0732 10/26/18 1119  GLUCAP 283* 125* 242* 208*  193*   Lipid Profile: No results for input(s): CHOL, HDL, LDLCALC, TRIG, CHOLHDL, LDLDIRECT in the last 72 hours. Thyroid Function Tests: No results for input(s): TSH, T4TOTAL, FREET4, T3FREE, THYROIDAB in the last 72 hours. Anemia Panel: No results for input(s): VITAMINB12, FOLATE, FERRITIN, TIBC, IRON, RETICCTPCT in the last 72 hours. Sepsis Labs: No results for input(s): PROCALCITON, LATICACIDVEN in the last 168 hours.  Recent Results (from the past 240 hour(s))  SARS Coronavirus 2 [facility] order, Performed in [facility] hospital lab) Nasopharyngeal Nasopharyngeal Swab     Status: None   Collection Time: 10/18/18  7:20 PM   Specimen: Nasopharyngeal Swab  Result Value Ref Range Status   SARS Coronavirus 2 NEGATIVE NEGATIVE Final    Comment: (NOTE) If result is NEGATIVE SARS-CoV-2 target nucleic acids are NOT DETECTED. The SARS-CoV-2 RNA is generally detectable in upper and lower   respiratory specimens during the acute phase of infection. The lowest  concentration of SARS-CoV-2 viral copies this assay can detect is 250  copies / mL. A negative result does not preclude SARS-CoV-2 infection  and should not be used as the sole basis for treatment or other  patient management decisions.  A negative result may occur with  improper specimen collection / handling, submission of specimen other  than nasopharyngeal swab, presence of viral mutation(s) within the  areas targeted by this assay, and inadequate number of viral copies  (<250 copies / mL). A negative result must be combined with clinical  observations, patient history, and epidemiological information. If result is POSITIVE SARS-CoV-2 target nucleic acids are DETECTED. The SARS-CoV-2 RNA is generally detectable in upper and lower  respiratory specimens dur ing the acute phase of infection.  Positive  results are indicative of active infection with SARS-CoV-2.  Clinical  correlation with patient history and other diagnostic information is  necessary to determine patient infection status.  Positive results do  not rule out bacterial infection or co-infection with other viruses. If result is PRESUMPTIVE POSTIVE SARS-CoV-2 nucleic acids MAY BE PRESENT.   A presumptive positive result was obtained on the submitted specimen  and confirmed on repeat testing.  While 2019 novel coronavirus  (SARS-CoV-2) nucleic acids may be present in the submitted sample  additional confirmatory testing may be necessary for epidemiological  and / or clinical management purposes  to differentiate between  SARS-CoV-2 and other Sarbecovirus currently known to infect humans.  If clinically indicated additional testing with an alternate test  methodology [phone]) is advised. The SARS-CoV-2 RNA is generally  detectable in upper and lower respiratory sp ecimens during the acute  phase of infection. The expected result is Negative. Fact  Sheet for Patients:  StrictlyIdeas.no Fact Sheet for Healthcare Providers: BankingDealers.co.za This test is not yet approved or cleared by the [place] FDA and has been authorized for detection and/or diagnosis of SARS-CoV-2 by FDA under an Emergency Use Authorization (EUA).  This EUA will remain in effect (meaning this test can be used) for the duration of the COVID-19 declaration under Section 564(b)(1) of the Act, 21 U.S.C. section 360bbb-3(b)(1), unless the authorization is terminated or revoked sooner. Performed at [facility], [address], [place], [place] [address]   Culture, blood (Routine X 2) w Reflex to ID Panel     Status: None   Collection Time: 10/18/18 11:43 PM   Specimen: Left Antecubital; Blood  Result Value Ref Range Status   Specimen Description LEFT ANTECUBITAL  Final   Special Requests   Final  BOTTLES DRAWN AEROBIC AND ANAEROBIC Blood Culture adequate volume   Culture   Final    NO GROWTH 5 DAYS Performed at [facility], [address] [place], [place] [address]    Report Status 10/24/2018 FINAL  Final  Culture, blood (Routine X 2) w Reflex to ID Panel     Status: None   Collection Time: 10/19/18 12:00 AM   Specimen: BLOOD LEFT WRIST  Result Value Ref Range Status   Specimen Description BLOOD LEFT WRIST  Final   Special Requests   Final    BOTTLES DRAWN AEROBIC AND ANAEROBIC Blood Culture adequate volume   Culture   Final    NO GROWTH 5 DAYS Performed at [facility], [address], [place], [place] [address]    Report Status 10/24/2018 FINAL  Final  MRSA PCR Screening     Status: None   Collection Time: 10/19/18  6:04 AM   Specimen: Nasal Mucosa; Nasopharyngeal  Result Value Ref Range Status   MRSA by PCR NEGATIVE NEGATIVE Final    Comment:        The GeneXpert MRSA Assay (FDA approved for NASAL specimens only), is one component of a comprehensive MRSA colonization surveillance program. It  is not intended to diagnose MRSA infection nor to guide or monitor treatment for MRSA infections. Performed at [facility], [address], [place], [place] [address]   SARS Coronavirus 2 [facility] order, Performed in [facility] hospital lab) Nasopharyngeal Nasopharyngeal Swab     Status: None   Collection Time: 10/23/18 11:14 PM   Specimen: Nasopharyngeal Swab  Result Value Ref Range Status   SARS Coronavirus 2 NEGATIVE NEGATIVE Final    Comment: (NOTE) If result is NEGATIVE SARS-CoV-2 target nucleic acids are NOT DETECTED. The SARS-CoV-2 RNA is generally detectable in upper and lower  respiratory specimens during the acute phase of infection. The lowest  concentration of SARS-CoV-2 viral copies this assay can detect is 250  copies / mL. A negative result does not preclude SARS-CoV-2 infection  and should not be used as the sole basis for treatment or other  patient management decisions.  A negative result may occur with  improper specimen collection / handling, submission of specimen other  than nasopharyngeal swab, presence of viral mutation(s) within the  areas targeted by this assay, and inadequate number of viral copies  (<250 copies / mL). A negative result must be combined with clinical  observations, patient history, and epidemiological information. If result is POSITIVE SARS-CoV-2 target nucleic acids are DETECTED. The SARS-CoV-2 RNA is generally detectable in upper and lower  respiratory specimens dur ing the acute phase of infection.  Positive  results are indicative of active infection with SARS-CoV-2.  Clinical  correlation with patient history and other diagnostic information is  necessary to determine patient infection status.  Positive results do  not rule out bacterial infection or co-infection with other viruses. If result is PRESUMPTIVE POSTIVE SARS-CoV-2 nucleic acids MAY BE PRESENT.   A presumptive positive result was obtained on the submitted specimen   and confirmed on repeat testing.  While 2019 novel coronavirus  (SARS-CoV-2) nucleic acids may be present in the submitted sample  additional confirmatory testing may be necessary for epidemiological  and / or clinical management purposes  to differentiate between  SARS-CoV-2 and other Sarbecovirus currently known to infect humans.  If clinically indicated additional testing with an alternate test  methodology [phone]) is advised. The SARS-CoV-2 RNA is generally  detectable in upper and lower  respiratory sp ecimens during the acute  phase of infection. The expected result is Negative. Fact Sheet for Patients:  StrictlyIdeas.no Fact Sheet for Healthcare Providers: BankingDealers.co.za This test is not yet approved or cleared by the [place] FDA and has been authorized for detection and/or diagnosis of SARS-CoV-2 by FDA under an Emergency Use Authorization (EUA).  This EUA will remain in effect (meaning this test can be used) for the duration of the COVID-19 declaration under Section 564(b)(1) of the Act, 21 U.S.C. section 360bbb-3(b)(1), unless the authorization is terminated or revoked sooner. Performed at [facility], [address], [place], [place] [address]   Culture, blood (Routine X 2) w Reflex to ID Panel     Status: None (Preliminary result)   Collection Time: 10/24/18  2:21 AM   Specimen: BLOOD  Result Value Ref Range Status   Specimen Description BLOOD  Final   Special Requests NONE  Final   Culture   Final    NO GROWTH 2 DAYS Performed at [facility], [address] [place], [place] [address]    Report Status PENDING  Incomplete  Culture, blood (Routine X 2) w Reflex to ID Panel     Status: None (Preliminary result)   Collection Time: 10/24/18  2:27 AM   Specimen: BLOOD  Result Value Ref Range Status   Specimen Description BLOOD  Final   Special Requests NONE  Final   Culture   Final    NO GROWTH 2 DAYS  Performed at [facility], [address], [place], [place] [address]    Report Status PENDING  Incomplete  MRSA PCR Screening     Status: None   Collection Time: 10/24/18  7:57 AM   Specimen: Nasopharyngeal  Result Value Ref Range Status   MRSA by PCR NEGATIVE NEGATIVE Final    Comment:        The GeneXpert MRSA Assay (FDA approved for NASAL specimens only), is one component of a comprehensive MRSA colonization surveillance program. It is not intended to diagnose MRSA infection nor to guide or monitor treatment for MRSA infections. Performed at [facility], [address], [place],  [address]          Radiology Studies: No results found.      Scheduled Meds: . amiodarone  200 mg Oral Daily  . docusate sodium  100 mg Oral Daily  . fluticasone furoate-vilanterol  1 puff Inhalation Daily   And  . umeclidinium bromide  1 puff Inhalation Daily  . furosemide  60 mg Oral Daily  . guaiFENesin  600 mg Oral BID  . insulin aspart  0-15 Units Subcutaneous TID WC  . insulin aspart  0-5 Units Subcutaneous QHS  . insulin aspart  3 Units Subcutaneous TID WC  . insulin glargine  10 Units Subcutaneous Daily  . metFORMIN  500 mg Oral Q breakfast  . methylPREDNISolone (SOLU-MEDROL) injection  40 mg Intravenous Q12H  . metoprolol tartrate  50 mg Oral BID  . rivaroxaban  20 mg Oral Q supper  . sodium chloride flush  3 mL Intravenous Q12H   Continuous Infusions: . sodium chloride    . ceFEPime (MAXIPIME) IV 2 g (10/26/18 0506)     LOS: 2 days    Time spent: 30 minutes    Rameses Ou Darleen Crocker, DO [facility] Pager [phone]  If 7PM-7AM, please contact night-coverage [email or web address] Password [email or web address] 10/26/2018, 1:08 PM

## 2018-10-26 NOTE — Progress Notes (Signed)
Confusion remains throughout today. Now, patient sitting at bedside placing non food items in her mouth and pulling at IV. Dr. Manuella Ghazi notified. Elodia Florence RN 10/26/2018 1634

## 2018-10-27 LAB — CBC
HCT: 42 % (ref 36.0–46.0)
Hemoglobin: 12 g/dL (ref 12.0–15.0)
MCH: 28.2 pg (ref 26.0–34.0)
MCHC: 28.6 g/dL — ABNORMAL LOW (ref 30.0–36.0)
MCV: 98.6 fL (ref 80.0–100.0)
Platelets: 332 10*3/uL (ref 150–400)
RBC: 4.26 MIL/uL (ref 3.87–5.11)
RDW: 14 % (ref 11.5–15.5)
WBC: 13.6 10*3/uL — ABNORMAL HIGH (ref 4.0–10.5)
nRBC: 0 % (ref 0.0–0.2)

## 2018-10-27 LAB — BASIC METABOLIC PANEL
Anion gap: 11 (ref 5–15)
BUN: 18 mg/dL (ref 8–23)
CO2: 37 mmol/L — ABNORMAL HIGH (ref 22–32)
Calcium: 10.1 mg/dL (ref 8.9–10.3)
Chloride: 94 mmol/L — ABNORMAL LOW (ref 98–111)
Creatinine, Ser: 0.62 mg/dL (ref 0.44–1.00)
GFR calc Af Amer: >60 mL/min (ref >60)
GFR calc non Af Amer: >60 mL/min (ref >60)
Glucose, Bld: 111 mg/dL — ABNORMAL HIGH (ref 70–99)
Potassium: 4.4 mmol/L (ref 3.5–5.1)
Sodium: 142 mmol/L (ref 135–145)

## 2018-10-27 LAB — GLUCOSE, CAPILLARY
Glucose-Capillary: 91 mg/dL (ref 70–99)
Glucose-Capillary: 177 mg/dL — ABNORMAL HIGH (ref 70–99)
Glucose-Capillary: 52 mg/dL — ABNORMAL LOW (ref 70–99)
Glucose-Capillary: 90 mg/dL (ref 70–99)
Glucose-Capillary: 240 mg/dL — ABNORMAL HIGH (ref 70–99)

## 2018-10-27 MED ORDER — AMOXICILLIN-POT CLAVULANATE 875-125 MG PO TABS
1.0000 | ORAL_TABLET | Freq: Two times a day (BID) | ORAL | Status: DC
  Administered 2018-10-27 – 2018-10-28 (×2): 1 via ORAL
  Filled 2018-10-27 (×2): qty 1

## 2018-10-27 NOTE — Progress Notes (Signed)
Pharmacy Antibiotic Note  Today is day #4 of cefepime therapy for this 73 yo female with pneumonia.  WBC count is still slightly elevated, but patient is currently afebrile.  Blood cultures have been negative to date. Renal function appears fairly stable.  Plan: Vancomycin 1250 mg IV every 24 hours.  Goal trough 15-20 mcg/mL. Cefepime 2000 mg IV every 12 hours. Monitor labs, c/s, and vanco levels as indicated.  Height: 5\' 2"  (157.5 cm) Weight: 164 lb 0.4 oz (74.4 kg) IBW/kg (Calculated) : 50.1  Temp (24hrs), Avg:98.1 F (36.7 C), Min:97.9 F (36.6 C), Max:98.3 F (36.8 C)  Recent Labs  Lab 10/24/18 0000 10/24/18 0221 10/25/18 0536 10/26/18 0650 10/27/18 0647  WBC 11.5* 9.8 11.7*  --  13.6*  CREATININE 0.64 0.60 0.62 0.67 0.62    Estimated Creatinine Clearance: 59.1 mL/min (by C-G formula based on SCr of 0.62 mg/dL).    Allergies  Allergen Reactions  . Codeine Other (See Comments)    "jittery"    Antimicrobials this admission: vancomycin 8/13 >> 8/14 cefepime 8/13 >>    Microbiology results: 8/13 BC x2: ng x2 days 8/13 MRSA PCR: neg 8/15 UCx: pending  Thank you for allowing pharmacy to participate in  this patient's care.  Despina Pole, Pharm. D. Clinical Pharmacist 10/27/2018 11:06 AM

## 2018-10-27 NOTE — Progress Notes (Signed)
PROGRESS NOTE    Eileen [name]  PTW:[id number] DOB: [birth year] DOA: 10/23/2018 PCP: Lavella Lemons, PA   Brief Narrative:  Per HPI: [name],wRight breast cancer in remission,.L1 compression fracture s/p vertebroplasty, Anemia (iron def), Diverticulosis,Dm2, Aflutter, Moderate mitral regurgitation (06/27/2018), Copd (severe), chronic respiratory failure on 3.5Lwho was recently admitted for Copd exacerbation and pneumonia on 10/18/2018 and now presents with c/o dyspnea.Slight dry cough. No fever, no chills, no chest pain.  Patient was admitted for acute on chronic hypoxemic respiratory failure secondary to COPD exacerbation with H CAP.  She has been started on IV vancomycin and cefepime empiricallyand her MRSA screen has returned negative and therefore IV vancomycin will be discontinued on 8/14. She appears to overall be doing some better this morning and is currently on her baseline nasal cannula oxygen requirements of 3 L this morning. We will plan to decrease steroid use today.  8/15: Patient has had some episodes of agitation and confusion during this hospitalization which has worsened overnight.  Daughter states that this is been ongoing for several weeks at home as well.  She will require further evaluation with urine analysis as well as CT head and other studies as ordered.  She appears to be doing better from a respiratory standpoint, however.  8/16: Patient continued to have some worsening hallucinations and agitation yesterday, but appears to have responded well to some Haldol and administration of Seroquel and appears to be doing better today.  Urine analysis negative for signs of UTI and CT head negative for any acute abnormalities. Assessment & Plan:   Principal Problem:   COPD exacerbation ([name]) Active Problems:   Atrial flutter (HCC)   Atrial fibrillation with RVR (HCC)   Diabetes mellitus ([name])   Acute on chronic hypoxemic  respiratory failure secondary to COPD exacerbation with H CAP-improving -Hold further steroids at this time -Discontinue IV cefepime and transitioned to Augmentin for 3 more days -Continue breathing treatments only as needed for dyspnea/wheezing -Urine strep pneumonia antigen negative with Legionella negative as well -Blood cultures negative x3 day  Acute encephalopathy with hallucinations-improved -Periods of confusion and agitation noted which likely may be related to delirium and possible steroid-induced psychosis -Urinalysis without signs of UTI -CT head with no acute findings -Continue Haldol as needed -Seroquel started at bedtime on 8/15 -Continue to monitor for now on Seroquel and will likely require outpatient behavioral health evaluation versus inpatient if she continues to have ongoing symptomatology  Atrial flutter with moderate MR -Continue amiodarone, Lopressor, and Xarelto  Edema-improved -Continue Lasix 60 mg p.o. daily and hold potassium today due to some hyperkalemia -Recheck labs in a.m.  Type 2 diabetes with steroid-induced hyperglycemia -Hemoglobin A1c 8.5% on 09/2018 -Continue metformin -Continue SSI mealtime coverage -Continue Lantus 10 units daily for now -No further steroids at this time  Chronic anemia-stable -History of diverticulosis noted, but with no overt bleeding  History of L1 compression fracture status post vertebroplasty -Follow-up with PCP and will require regular bone density testing as well as anti-resorptive therapy   DVT prophylaxis:Xarelto Code Status:Full Family Communication: Spoke with daughter [name] on phone on 8/15 Disposition Plan:Plan for discharge hopefully in the next 24 hours to home with home health PT and RN as well as Education officer, museum. Has home oxygen.    Continue monitor for further agitation/delirium and consider TTS evaluation as needed.   Consultants:  None  Procedures:  None  Antimicrobials:   Anti-infectives (From admission, onward)   Start     Dose/Rate  Route Frequency Ordered Stop   10/27/18 1115  amoxicillin-clavulanate (AUGMENTIN) 875-125 MG per tablet 1 tablet     1 tablet Oral Every 12 hours 10/27/18 1111     10/25/18 0500  vancomycin (VANCOCIN) 1,250 mg in sodium chloride 0.9 % 250 mL IVPB  Status:  Discontinued     1,250 mg 166.7 mL/hr over 90 Minutes Intravenous Every 24 hours 10/24/18 0758 10/25/18 0858   10/24/18 1600  ceFEPIme (MAXIPIME) 2 g in sodium chloride 0.9 % 100 mL IVPB  Status:  Discontinued     2 g 200 mL/hr over 30 Minutes Intravenous Every 12 hours 10/24/18 0755 10/27/18 1111   10/24/18 0245  vancomycin (VANCOCIN) 1,500 mg in sodium chloride 0.9 % 500 mL IVPB     1,500 mg 250 mL/hr over 120 Minutes Intravenous  Once 10/24/18 0224 10/24/18 0516   10/24/18 0230  ceFEPIme (MAXIPIME) 2 g in sodium chloride 0.9 % 100 mL IVPB     2 g 200 mL/hr over 30 Minutes Intravenous  Once 10/24/18 0224 10/24/18 0315       Subjective: Patient seen and evaluated today with no new acute complaints or concerns.  She is resting comfortably and appears to be much less confused.  She was having periods of hallucinations and agitation yesterday.  Objective: Vitals:   10/27/18 0500 10/27/18 0517 10/27/18 0802 10/27/18 0943  BP:  140/75  124/82  Pulse:  75  66  Resp:  17    Temp:  97.9 F (36.6 C)    TempSrc:  Oral    SpO2:  92% 96%   Weight: 74.4 kg     Height:        Intake/Output Summary (Last 24 hours) at 10/27/2018 1112 Last data filed at 10/27/2018 0650 Gross per 24 hour  Intake 600 ml  Output 200 ml  Net 400 ml   Filed Weights   10/25/18 0415 10/26/18 0500 10/27/18 0500  Weight: 73.3 kg 73.3 kg 74.4 kg    Examination:  General exam: Appears calm and comfortable, somnolent Respiratory system: Clear to auscultation. Respiratory effort normal.  Currently on 3 L nasal cannula Cardiovascular system: S1 & S2 heard, RRR. No JVD, murmurs, rubs, gallops or  clicks. No pedal edema. Gastrointestinal system: Abdomen is nondistended, soft and nontender. No organomegaly or masses felt. Normal bowel sounds heard. Central nervous system: Somnolent Extremities: No significant edema Skin: No rashes, lesions or ulcers Psychiatry: Cannot be appropriately assessed    Data Reviewed: I have personally reviewed following labs and imaging studies  CBC: Recent Labs  Lab 10/24/18 0000 10/24/18 0221 10/25/18 0536 10/27/18 0647  WBC 11.5* 9.8 11.7* 13.6*  NEUTROABS 9.0*  --   --   --   HGB 11.1* 11.1* 10.9* 12.0  HCT 40.4 40.1 38.1 42.0  MCV 100.2* 101.3* 99.2 98.6  PLT 265 254 291 742   Basic Metabolic Panel: Recent Labs  Lab 10/24/18 0000 10/24/18 0221 10/25/18 0536 10/26/18 0650 10/27/18 0647  NA 143 145 138 139 142  K 4.3 3.7 4.9 4.7 4.4  CL 93* 92* 91* 91* 94*  CO2 41* 39* 36* 35* 37*  GLUCOSE 201* 184* 299* 233* 111*  BUN 19 17 17 20 18   CREATININE 0.64 0.60 0.62 0.67 0.62  CALCIUM 9.4 9.4 9.4 10.1 10.1   GFR: Estimated Creatinine Clearance: 59.1 mL/min (by C-G formula based on SCr of 0.62 mg/dL). Liver Function Tests: Recent Labs  Lab 10/24/18 0221  AST 15  ALT 21  ALKPHOS  76  BILITOT 0.7  PROT 6.6  ALBUMIN 3.9   No results for input(s): LIPASE, AMYLASE in the last 168 hours. Recent Labs  Lab 10/26/18 1322  AMMONIA 17   Coagulation Profile: No results for input(s): INR, PROTIME in the last 168 hours. Cardiac Enzymes: No results for input(s): CKTOTAL, CKMB, CKMBINDEX, TROPONINI in the last 168 hours. BNP (last 3 results) No results for input(s): PROBNP in the last 8760 hours. HbA1C: No results for input(s): HGBA1C in the last 72 hours. CBG: Recent Labs  Lab 10/26/18 0732 10/26/18 1119 10/26/18 1601 10/26/18 2036 10/27/18 0754  GLUCAP 208* 193* 157* 118* 91   Lipid Profile: No results for input(s): CHOL, HDL, LDLCALC, TRIG, CHOLHDL, LDLDIRECT in the last 72 hours. Thyroid Function Tests: Recent Labs     10/26/18 1322  TSH 0.831   Anemia Panel: No results for input(s): VITAMINB12, FOLATE, FERRITIN, TIBC, IRON, RETICCTPCT in the last 72 hours. Sepsis Labs: No results for input(s): PROCALCITON, LATICACIDVEN in the last 168 hours.  Recent Results (from the past 240 hour(s))  SARS Coronavirus 2 [facility] order, Performed in [facility] hospital lab) Nasopharyngeal Nasopharyngeal Swab     Status: None   Collection Time: 10/18/18  7:20 PM   Specimen: Nasopharyngeal Swab  Result Value Ref Range Status   SARS Coronavirus 2 NEGATIVE NEGATIVE Final    Comment: (NOTE) If result is NEGATIVE SARS-CoV-2 target nucleic acids are NOT DETECTED. The SARS-CoV-2 RNA is generally detectable in upper and lower  respiratory specimens during the acute phase of infection. The lowest  concentration of SARS-CoV-2 viral copies this assay can detect is 250  copies / mL. A negative result does not preclude SARS-CoV-2 infection  and should not be used as the sole basis for treatment or other  patient management decisions.  A negative result may occur with  improper specimen collection / handling, submission of specimen other  than nasopharyngeal swab, presence of viral mutation(s) within the  areas targeted by this assay, and inadequate number of viral copies  (<250 copies / mL). A negative result must be combined with clinical  observations, patient history, and epidemiological information. If result is POSITIVE SARS-CoV-2 target nucleic acids are DETECTED. The SARS-CoV-2 RNA is generally detectable in upper and lower  respiratory specimens dur ing the acute phase of infection.  Positive  results are indicative of active infection with SARS-CoV-2.  Clinical  correlation with patient history and other diagnostic information is  necessary to determine patient infection status.  Positive results do  not rule out bacterial infection or co-infection with other viruses. If result is PRESUMPTIVE POSTIVE SARS-CoV-2  nucleic acids MAY BE PRESENT.   A presumptive positive result was obtained on the submitted specimen  and confirmed on repeat testing.  While 2019 novel coronavirus  (SARS-CoV-2) nucleic acids may be present in the submitted sample  additional confirmatory testing may be necessary for epidemiological  and / or clinical management purposes  to differentiate between  SARS-CoV-2 and other Sarbecovirus currently known to infect humans.  If clinically indicated additional testing with an alternate test  methodology [phone]) is advised. The SARS-CoV-2 RNA is generally  detectable in upper and lower respiratory sp ecimens during the acute  phase of infection. The expected result is Negative. Fact Sheet for Patients:  StrictlyIdeas.no Fact Sheet for Healthcare Providers: BankingDealers.co.za This test is not yet approved or cleared by the [place] FDA and has been authorized for detection and/or diagnosis of SARS-CoV-2 by FDA under an Emergency  Use Authorization (EUA).  This EUA will remain in effect (meaning this test can be used) for the duration of the COVID-19 declaration under Section 564(b)(1) of the Act, 21 U.S.C. section 360bbb-3(b)(1), unless the authorization is terminated or revoked sooner. Performed at [facility], [address] [place], [place] [address]   Culture, blood (Routine X 2) w Reflex to ID Panel     Status: None   Collection Time: 10/18/18 11:43 PM   Specimen: Left Antecubital; Blood  Result Value Ref Range Status   Specimen Description LEFT ANTECUBITAL  Final   Special Requests   Final    BOTTLES DRAWN AEROBIC AND ANAEROBIC Blood Culture adequate volume   Culture   Final    NO GROWTH 5 DAYS Performed at [facility], [address], [place], [place] [address]    Report Status 10/24/2018 FINAL  Final  Culture, blood (Routine X 2) w Reflex to ID Panel     Status: None   Collection Time: 10/19/18 12:00 AM    Specimen: BLOOD LEFT WRIST  Result Value Ref Range Status   Specimen Description BLOOD LEFT WRIST  Final   Special Requests   Final    BOTTLES DRAWN AEROBIC AND ANAEROBIC Blood Culture adequate volume   Culture   Final    NO GROWTH 5 DAYS Performed at [facility], [address], [place], [place] [address]    Report Status 10/24/2018 FINAL  Final  MRSA PCR Screening     Status: None   Collection Time: 10/19/18  6:04 AM   Specimen: Nasal Mucosa; Nasopharyngeal  Result Value Ref Range Status   MRSA by PCR NEGATIVE NEGATIVE Final    Comment:        The GeneXpert MRSA Assay (FDA approved for NASAL specimens only), is one component of a comprehensive MRSA colonization surveillance program. It is not intended to diagnose MRSA infection nor to guide or monitor treatment for MRSA infections. Performed at [facility], [address], [place], [place] [address]   SARS Coronavirus 2 [facility] order, Performed in [facility] hospital lab) Nasopharyngeal Nasopharyngeal Swab     Status: None   Collection Time: 10/23/18 11:14 PM   Specimen: Nasopharyngeal Swab  Result Value Ref Range Status   SARS Coronavirus 2 NEGATIVE NEGATIVE Final    Comment: (NOTE) If result is NEGATIVE SARS-CoV-2 target nucleic acids are NOT DETECTED. The SARS-CoV-2 RNA is generally detectable in upper and lower  respiratory specimens during the acute phase of infection. The lowest  concentration of SARS-CoV-2 viral copies this assay can detect is 250  copies / mL. A negative result does not preclude SARS-CoV-2 infection  and should not be used as the sole basis for treatment or other  patient management decisions.  A negative result may occur with  improper specimen collection / handling, submission of specimen other  than nasopharyngeal swab, presence of viral mutation(s) within the  areas targeted by this assay, and inadequate number of viral copies  (<250 copies / mL). A negative result must be combined with  clinical  observations, patient history, and epidemiological information. If result is POSITIVE SARS-CoV-2 target nucleic acids are DETECTED. The SARS-CoV-2 RNA is generally detectable in upper and lower  respiratory specimens dur ing the acute phase of infection.  Positive  results are indicative of active infection with SARS-CoV-2.  Clinical  correlation with patient history and other diagnostic information is  necessary to determine patient infection status.  Positive results do  not rule out bacterial  infection or co-infection with other viruses. If result is PRESUMPTIVE POSTIVE SARS-CoV-2 nucleic acids MAY BE PRESENT.   A presumptive positive result was obtained on the submitted specimen  and confirmed on repeat testing.  While 2019 novel coronavirus  (SARS-CoV-2) nucleic acids may be present in the submitted sample  additional confirmatory testing may be necessary for epidemiological  and / or clinical management purposes  to differentiate between  SARS-CoV-2 and other Sarbecovirus currently known to infect humans.  If clinically indicated additional testing with an alternate test  methodology [phone]) is advised. The SARS-CoV-2 RNA is generally  detectable in upper and lower respiratory sp ecimens during the acute  phase of infection. The expected result is Negative. Fact Sheet for Patients:  StrictlyIdeas.no Fact Sheet for Healthcare Providers: BankingDealers.co.za This test is not yet approved or cleared by the [place] FDA and has been authorized for detection and/or diagnosis of SARS-CoV-2 by FDA under an Emergency Use Authorization (EUA).  This EUA will remain in effect (meaning this test can be used) for the duration of the COVID-19 declaration under Section 564(b)(1) of the Act, 21 U.S.C. section 360bbb-3(b)(1), unless the authorization is terminated or revoked sooner. Performed at [facility], [address]  [place], [place] [address]   Culture, blood (Routine X 2) w Reflex to ID Panel     Status: None (Preliminary result)   Collection Time: 10/24/18  2:21 AM   Specimen: BLOOD  Result Value Ref Range Status   Specimen Description BLOOD  Final   Special Requests NONE  Final   Culture   Final    NO GROWTH 2 DAYS Performed at [facility], [address], [place], [place] [address]    Report Status PENDING  Incomplete  Culture, blood (Routine X 2) w Reflex to ID Panel     Status: None (Preliminary result)   Collection Time: 10/24/18  2:27 AM   Specimen: BLOOD  Result Value Ref Range Status   Specimen Description BLOOD  Final   Special Requests NONE  Final   Culture   Final    NO GROWTH 2 DAYS Performed at [facility], [address] [place], [place] [address]    Report Status PENDING  Incomplete  MRSA PCR Screening     Status: None   Collection Time: 10/24/18  7:57 AM   Specimen: Nasopharyngeal  Result Value Ref Range Status   MRSA by PCR NEGATIVE NEGATIVE Final    Comment:        The GeneXpert MRSA Assay (FDA approved for NASAL specimens only), is one component of a comprehensive MRSA colonization surveillance program. It is not intended to diagnose MRSA infection nor to guide or monitor treatment for MRSA infections. Performed at [facility], [address] [place],  [address]          Radiology Studies: Ct Head Wo Contrast  Result Date: 10/26/2018 CLINICAL DATA:  Patient has had some episodes of agitation and confusion during this hospitalization which has worsened overnight. Daughter states that this is been ongoing for several weeks at home as well.Altered level of consciousness (LOC), unexplained EXAM: CT HEAD WITHOUT CONTRAST TECHNIQUE: Contiguous axial images were obtained from the base of the skull through the vertex without intravenous contrast. COMPARISON:  None. FINDINGS: Brain: No acute intracranial hemorrhage. No focal mass lesion. No CT evidence of acute  infarction. No midline shift or mass effect. No hydrocephalus. Basilar cisterns are patent. Mild periventricular white matter hypodensities. Vascular: No hyperdense vessel or unexpected  calcification. Skull: Normal. Negative for fracture or focal lesion. Sinuses/Orbits: Paranasal sinuses and mastoid air cells are clear. Orbits are clear. Other: None. IMPRESSION: No acute intracranial findings. Electronically Signed   By: Suzy Bouchard M.D.   On: 10/26/2018 15:45        Scheduled Meds:  amiodarone  200 mg Oral Daily   amoxicillin-clavulanate  1 tablet Oral Q12H   docusate sodium  100 mg Oral Daily   fluticasone furoate-vilanterol  1 puff Inhalation Daily   And   umeclidinium bromide  1 puff Inhalation Daily   furosemide  60 mg Oral Daily   guaiFENesin  600 mg Oral BID   insulin aspart  0-15 Units Subcutaneous TID WC   insulin aspart  0-5 Units Subcutaneous QHS   insulin aspart  3 Units Subcutaneous TID WC   insulin glargine  10 Units Subcutaneous Daily   metFORMIN  500 mg Oral Q breakfast   metoprolol tartrate  50 mg Oral BID   QUEtiapine  25 mg Oral QHS   rivaroxaban  20 mg Oral Q supper   sodium chloride flush  3 mL Intravenous Q12H   Continuous Infusions:  sodium chloride 250 mL (10/26/18 1524)     LOS: 3 days    Time spent: 30 minutes    Pheonix Wisby Darleen Crocker, DO [facility] Pager [phone]  If 7PM-7AM, please contact night-coverage [email or web address] Password [email or web address] 10/27/2018, 11:12 AM

## 2018-10-28 LAB — URINE CULTURE: Culture: NO GROWTH

## 2018-10-28 LAB — GLUCOSE, CAPILLARY
Glucose-Capillary: 177 mg/dL — ABNORMAL HIGH (ref 70–99)
Glucose-Capillary: 110 mg/dL — ABNORMAL HIGH (ref 70–99)
Glucose-Capillary: 58 mg/dL — ABNORMAL LOW (ref 70–99)

## 2018-10-28 LAB — CREATININE, SERUM
Creatinine, Ser: 0.72 mg/dL (ref 0.44–1.00)
GFR calc Af Amer: >60 mL/min (ref >60)
GFR calc non Af Amer: >60 mL/min (ref >60)

## 2018-10-28 MED ORDER — AMOXICILLIN-POT CLAVULANATE 875-125 MG PO TABS: 1.0000 | ORAL_TABLET | Freq: Two times a day (BID) | ORAL | 0 refills | Status: AC

## 2018-10-28 MED ORDER — QUETIAPINE FUMARATE 25 MG PO TABS: 25.0000 mg | ORAL_TABLET | Freq: Every day | ORAL | 2 refills | Status: DC

## 2018-10-28 NOTE — Progress Notes (Addendum)
Hypoglycemic Event  CBG: 57  Treatment: Juice  Symptoms: None  Possible Reasons for Event: Pt only ate 50% of breakfast  Recheck: 110 - Patient now has lunch tray as well, instructed to eat as much as she could   Karlton Lemon, Marland Kitchen

## 2018-10-28 NOTE — TOC Transition Note (Addendum)
Transition of Care [facility]) - CM/SW Discharge Note   Patient Details  Name: Eileen [name] MRN: [id number] Date of Birth: 24-Apr-1945  [facility] of [facility]) CM/SW Contact:  Boneta Lucks, RN Phone Number: 10/28/2018, 10:55 AM   Clinical Narrative:   Patient discharging home, Patient will resume Home health with [facility] for RN/PT/SW. Vaughan Basta is aware for discharge.Spoke with [name]- daughter, CM is working on [employer] patient behavioral health appointment. [facility] will not accept due to only Medicare. Referred to  [place] office is reviewing the chart to determine if they can accept the patient. THis will have to be a Phone visit with camera phone. [name] states she can use her phone for the visit. Patient used Home [employer] form [employer] and has DME's she needs.    Addendum [facility] did not accept the referral.  CM called [facility], they will do the [facility] assessment tomorrow using [name]'s phone. MD,and [name] updated, Information placed on AVS.  Final next level of care: [place] Barriers to Discharge: Barriers Resolved   Patient Goals and CMS Choice     Choice offered to / list presented to : Patient  Discharge Placement                  Name of family member notified: [facility] Patient and family notified of of transfer: 10/28/18  Discharge Plan and Services In-house Referral: Clinical Social Work   Post Acute Care Choice: [place]: RN, Social Work [facility] Agency: [place] ([place]) Date [place]: 10/25/18 Time [place]: 1207 Representative spoke with at [place]: [place] ([place]) Interventions     Readmission Risk Interventions Readmission Risk Prevention Plan 10/25/2018 10/24/2018 08/22/2018  Transportation Screening - Complete Complete  PCP or Specialist Appt within 5-7 Days - - Complete  Home Care Screening - -  Complete  Medication Review (RN CM) - - Complete  Medication Review Press photographer) - Complete -  PCP or Specialist appointment within 3-5 days of discharge Complete - -  [place] or Home Care Consult Complete - -  SW Recovery Care/Counseling Consult - Complete -  Palliative Care Screening Not Complete - -  Comments Palliative not available - -  [place] Not Applicable - -  Some recent data might be hidden

## 2018-10-28 NOTE — Care Management Important Message (Signed)
Important Message  Patient Details  Name: Eileen [name] MRN: [id number] Date of Birth: [birth year]   Medicare Important Message Given:  Yes     Boneta Lucks, RN 10/28/2018, 11:53 AM

## 2018-10-28 NOTE — Progress Notes (Signed)
IV removed, site WNL.  Pt given d/c instructions, new prescriptions called into pharmacy of choice. Discussed all home medications (when, how, and why to take) with patient and her daughter, verbalizes understanding. Discussed home care with patient, teachback completed. F/U appointment in place, pt states they will keep appointment. Pt is stable at this time. Pt taken to main entrance in wheelchair by staff member.

## 2018-10-28 NOTE — Discharge Summary (Addendum)
Physician Discharge Summary  [name] Belson IRC:[id number] DOB: [birth year] DOA: 10/23/2018  PCP: Lavella Lemons, PA  Admit date: 10/23/2018  Discharge date: 10/28/2018  Admitted From:Home  Disposition:  Home  Recommendations for Outpatient Follow-up:  1. Follow up with PCP in 1-2 weeks 2. [facility]-up [facility] on 8/18 at 3 PM as scheduled 3. Continue on Seroquel 25 mg at bedtime and avoid further steroid use which appears to exacerbate behavioral disturbance 4. Continue on Augmentin as prescribed for 3 more days to complete course of treatment 5. Patient will be discharged with home health services  Home Health: Yes, with PT, RN, and CSW  Equipment/Devices: Has home oxygen 3-4 L  Discharge Condition: Stable  CODE STATUS: Full  Diet recommendation: Heart Healthy/carb modified  Brief/Interim Summary: Per HPI: [name],wRight breast cancer in remission,.L1 compression fracture s/p vertebroplasty, Anemia (iron def), Diverticulosis,Dm2, Aflutter, Moderate mitral regurgitation (06/27/2018), Copd (severe), chronic respiratory failure on 3.5Lwho was recently admitted for Copd exacerbation and pneumonia on 10/18/2018 and now presents with c/o dyspnea.Slight dry cough. No fever, no chills, no chest pain.  Patient was admitted for acute on chronic hypoxemic respiratory failure secondary to COPD exacerbation with H CAP.  She has been started on IV vancomycin and cefepime empiricallyand her MRSA screen has returned negative and therefore IV vancomycin will be discontinued on 8/14. She appears to overall be doing some better this morning and is currently on her baseline nasal cannula oxygen requirements of 3 L this morning. We will plan to decrease steroid use today.  8/15:Patient has had some episodes of agitation and confusion during this hospitalization which has worsened overnight. Daughter states that this is been ongoing for several  weeks at home as well. She will require further evaluation with urine analysis as well as CT head and other studies as ordered. She appears to be doing better from a respiratory standpoint, however.  8/16: Patient continued to have some worsening hallucinations and agitation yesterday, but appears to have responded well to some Haldol and administration of Seroquel and appears to be doing better today.  Urine analysis negative for signs of UTI and CT head negative for any acute abnormalities.  8/17: Patient has been doing quite well over the last 48 hours with use of Seroquel at bedtime.  No further signs of agitation or confusion noted and she appears to be ready for discharge.  She is tolerating oral Augmentin which will be continued for 3 more days.  She will be followed up outpatient [facility] to further assess her behavioral disturbance.  She is to continue her usual home oxygen and breathing treatments as needed.  She will be discharged with Seroquel.  Close follow-up with PCP recommended.  Discharge Diagnoses:  Principal Problem:   COPD exacerbation ([place]) Active Problems:   Atrial flutter ([place])   Atrial fibrillation with RVR ([place])   Diabetes mellitus ([place])  Principal discharge diagnosis: Acute on chronic hypoxemic respiratory failure secondary to COPD exacerbation with HCAP.  Discharge Instructions  Discharge Instructions    Diet - low sodium heart healthy   Complete by: As directed    Increase activity slowly   Complete by: As directed      Allergies as of 10/28/2018      Reactions   Codeine Other (See Comments)   "jittery"      Medication List    STOP taking these medications   doxycycline 100 MG tablet Commonly known as: VIBRA-TABS   predniSONE  10 MG tablet Commonly known as: DELTASONE     TAKE these medications   acetaminophen 500 MG tablet Commonly known as: TYLENOL Take 500 mg by mouth every 8 (eight) hours as needed for mild pain or  headache.   amiodarone 200 MG tablet Commonly known as: PACERONE Take 1 tablet (200 mg total) by mouth daily.   amoxicillin-clavulanate 875-125 MG tablet Commonly known as: AUGMENTIN Take 1 tablet by mouth every 12 (twelve) hours for 3 days.   docusate sodium 100 MG capsule Commonly known as: Colace Take 1 capsule (100 mg total) by mouth 2 (two) times daily. What changed: when to take this   furosemide 40 MG tablet Commonly known as: LASIX Take 1.5 tablets (60 mg total) by mouth daily. What changed: when to take this   guaiFENesin 600 MG 12 hr tablet Commonly known as: MUCINEX Take 1 tablet (600 mg total) by mouth 2 (two) times daily.   metFORMIN 500 MG 24 hr tablet Commonly known as: GLUCOPHAGE-XR Take 500 mg by mouth daily with breakfast.   metoprolol tartrate 50 MG tablet Commonly known as: LOPRESSOR Take 50 mg by mouth 2 (two) times daily.   polyethylene glycol 17 g packet Commonly known as: MIRALAX / GLYCOLAX Take 17 g by mouth daily as needed for mild constipation.   potassium chloride SA 20 MEQ tablet Commonly known as: K-DUR Take 20 mEq by mouth 2 (two) times daily.   albuterol (2.5 MG/3ML) 0.083% nebulizer solution Commonly known as: PROVENTIL Take 2.5 mg by nebulization every 6 (six) hours as needed for wheezing or shortness of breath.   ProAir HFA 108 (90 Base) MCG/ACT inhaler Generic drug: albuterol Inhale 2 puffs into the lungs every 6 (six) hours as needed for wheezing or shortness of breath.   QUEtiapine 25 MG tablet Commonly known as: SEROQUEL Take 1 tablet (25 mg total) by mouth at bedtime.   rivaroxaban 20 MG Tabs tablet Commonly known as: Xarelto Take 1 tablet (20 mg total) by mouth daily with supper. Stop Xarelto until Monday (08/26/18) in anticipation for kyphoplasty   Trelegy Ellipta 100-62.5-25 MCG/INH Aepb Generic drug: Fluticasone-Umeclidin-Vilant Take 1 puff by mouth daily.      Follow-up Information    Lavella Lemons, [place]. Go on  10/31/2018.   Specialty: Physician Assistant Why: Please arrive by 11:15 for a hospital follow up appointment Contact information: [place] [address] [phone]        Services, [facility]. Schedule an appointment as soon as possible for a visit.   Contact information: [address] [place] [address] [phone]          Allergies  Allergen Reactions  . Codeine Other (See Comments)    "jittery"    Consultations:  None   Procedures/Studies: Ct Head Wo Contrast  Result Date: 10/26/2018 CLINICAL DATA:  Patient has had some episodes of agitation and confusion during this hospitalization which has worsened overnight. Daughter states that this is been ongoing for several weeks at home as well.Altered level of consciousness (LOC), unexplained EXAM: CT HEAD WITHOUT CONTRAST TECHNIQUE: Contiguous axial images were obtained from the base of the skull through the vertex without intravenous contrast. COMPARISON:  None. FINDINGS: Brain: No acute intracranial hemorrhage. No focal mass lesion. No CT evidence of acute infarction. No midline shift or mass effect. No hydrocephalus. Basilar cisterns are patent. Mild periventricular white matter hypodensities. Vascular: No hyperdense vessel or unexpected calcification. Skull: Normal. Negative for fracture or focal lesion. Sinuses/Orbits: Paranasal  sinuses and mastoid air cells are clear. Orbits are clear. Other: None. IMPRESSION: No acute intracranial findings. Electronically Signed   By: Suzy Bouchard M.D.   On: 10/26/2018 15:45   Dg Chest Port 1 View  Result Date: 10/23/2018 CLINICAL DATA:  Shortness of breath EXAM: PORTABLE CHEST 1 VIEW COMPARISON:  October 18, 2018 FINDINGS: There is slight interval worsening in the patchy airspace opacities seen at both lung bases. There is mildly increased interstitial markings seen throughout both lungs. The cardiomediastinal silhouette is unchanged. No acute osseous abnormality.  IMPRESSION: Slight interval worsening in the patchy airspace opacities in both lung bases which could be due to edema, atelectasis, and/or infectious etiology. Electronically Signed   By: Prudencio Pair M.D.   On: 10/23/2018 23:28   Dg Chest Portable 1 View  Result Date: 10/18/2018 CLINICAL DATA:  Shortness of breath. EXAM: PORTABLE CHEST 1 VIEW COMPARISON:  September 26, 2018 FINDINGS: Emphysematous changes identified. Opacity in the right base. Atelectasis in the left base. Stable cardiomegaly. No other changes. IMPRESSION: 1. Opacity in the right base could represent atelectasis or developing infiltrate. Recommend follow-up to resolution. Electronically Signed   By: Dorise Bullion III M.D   On: 10/18/2018 19:42     Discharge Exam: Vitals:   10/28/18 0832 10/28/18 0900  BP:  (!) 150/74  Pulse:  84  Resp:  18  Temp:  (!) 97.2 F (36.2 C)  SpO2: 96% 91%   Vitals:   10/27/18 2144 10/28/18 0515 10/28/18 0832 10/28/18 0900  BP: (!) 125/55 133/72  (!) 150/74  Pulse: 85 69  84  Resp: 19 17  18   Temp: 98.5 F (36.9 C) 98.6 F (37 C)  (!) 97.2 F (36.2 C)  TempSrc: Oral Oral  Oral  SpO2: 96% 95% 96% 91%  Weight:      Height:        General: Pt is alert, awake, not in acute distress, oriented x3 Cardiovascular: RRR, S1/S2 +, no rubs, no gallops Respiratory: CTA bilaterally, no wheezing, no rhonchi, currently on 4 L nasal cannula Abdominal: Soft, NT, ND, bowel sounds + Extremities: no edema, no cyanosis    The results of significant diagnostics from this hospitalization (including imaging, microbiology, ancillary and laboratory) are listed below for reference.     Microbiology: Recent Results (from the past 240 hour(s))  SARS Coronavirus 2 [facility] order, Performed in [facility] hospital lab) Nasopharyngeal Nasopharyngeal Swab     Status: None   Collection Time: 10/18/18  7:20 PM   Specimen: Nasopharyngeal Swab  Result Value Ref Range Status   SARS Coronavirus 2 NEGATIVE NEGATIVE  Final    Comment: (NOTE) If result is NEGATIVE SARS-CoV-2 target nucleic acids are NOT DETECTED. The SARS-CoV-2 RNA is generally detectable in upper and lower  respiratory specimens during the acute phase of infection. The lowest  concentration of SARS-CoV-2 viral copies this assay can detect is 250  copies / mL. A negative result does not preclude SARS-CoV-2 infection  and should not be used as the sole basis for treatment or other  patient management decisions.  A negative result may occur with  improper specimen collection / handling, submission of specimen other  than nasopharyngeal swab, presence of viral mutation(s) within the  areas targeted by this assay, and inadequate number of viral copies  (<250 copies / mL). A negative result must be combined with clinical  observations, patient history, and epidemiological information. If result is POSITIVE SARS-CoV-2 target nucleic acids are DETECTED. The SARS-CoV-2  RNA is generally detectable in upper and lower  respiratory specimens dur ing the acute phase of infection.  Positive  results are indicative of active infection with SARS-CoV-2.  Clinical  correlation with patient history and other diagnostic information is  necessary to determine patient infection status.  Positive results do  not rule out bacterial infection or co-infection with other viruses. If result is PRESUMPTIVE POSTIVE SARS-CoV-2 nucleic acids MAY BE PRESENT.   A presumptive positive result was obtained on the submitted specimen  and confirmed on repeat testing.  While 2019 novel coronavirus  (SARS-CoV-2) nucleic acids may be present in the submitted sample  additional confirmatory testing may be necessary for epidemiological  and / or clinical management purposes  to differentiate between  SARS-CoV-2 and other Sarbecovirus currently known to infect humans.  If clinically indicated additional testing with an alternate test  methodology [phone]) is advised. The  SARS-CoV-2 RNA is generally  detectable in upper and lower respiratory sp ecimens during the acute  phase of infection. The expected result is Negative. Fact Sheet for Patients:  StrictlyIdeas.no Fact Sheet for Healthcare Providers: BankingDealers.co.za This test is not yet approved or cleared by the [place] FDA and has been authorized for detection and/or diagnosis of SARS-CoV-2 by FDA under an Emergency Use Authorization (EUA).  This EUA will remain in effect (meaning this test can be used) for the duration of the COVID-19 declaration under Section 564(b)(1) of the Act, 21 U.S.C. section 360bbb-3(b)(1), unless the authorization is terminated or revoked sooner. Performed at [facility], [address] [place], [place] [address]   Culture, blood (Routine X 2) w Reflex to ID Panel     Status: None   Collection Time: 10/18/18 11:43 PM   Specimen: Left Antecubital; Blood  Result Value Ref Range Status   Specimen Description LEFT ANTECUBITAL  Final   Special Requests   Final    BOTTLES DRAWN AEROBIC AND ANAEROBIC Blood Culture adequate volume   Culture   Final    NO GROWTH 5 DAYS Performed at [facility], [address], [place], [place] [address]    Report Status 10/24/2018 FINAL  Final  Culture, blood (Routine X 2) w Reflex to ID Panel     Status: None   Collection Time: 10/19/18 12:00 AM   Specimen: BLOOD LEFT WRIST  Result Value Ref Range Status   Specimen Description BLOOD LEFT WRIST  Final   Special Requests   Final    BOTTLES DRAWN AEROBIC AND ANAEROBIC Blood Culture adequate volume   Culture   Final    NO GROWTH 5 DAYS Performed at [facility], [address], [place], [place] [address]    Report Status 10/24/2018 FINAL  Final  MRSA PCR Screening     Status: None   Collection Time: 10/19/18  6:04 AM   Specimen: Nasal Mucosa; Nasopharyngeal  Result Value Ref Range Status   MRSA by PCR NEGATIVE NEGATIVE Final     Comment:        The GeneXpert MRSA Assay (FDA approved for NASAL specimens only), is one component of a comprehensive MRSA colonization surveillance program. It is not intended to diagnose MRSA infection nor to guide or monitor treatment for MRSA infections. Performed at [facility], [address], [place], [place] [address]   SARS Coronavirus 2 [facility] order, Performed in [facility] hospital lab) Nasopharyngeal Nasopharyngeal Swab     Status: None   Collection Time: 10/23/18 11:14 PM   Specimen: Nasopharyngeal Swab  Result  Value Ref Range Status   SARS Coronavirus 2 NEGATIVE NEGATIVE Final    Comment: (NOTE) If result is NEGATIVE SARS-CoV-2 target nucleic acids are NOT DETECTED. The SARS-CoV-2 RNA is generally detectable in upper and lower  respiratory specimens during the acute phase of infection. The lowest  concentration of SARS-CoV-2 viral copies this assay can detect is 250  copies / mL. A negative result does not preclude SARS-CoV-2 infection  and should not be used as the sole basis for treatment or other  patient management decisions.  A negative result may occur with  improper specimen collection / handling, submission of specimen other  than nasopharyngeal swab, presence of viral mutation(s) within the  areas targeted by this assay, and inadequate number of viral copies  (<250 copies / mL). A negative result must be combined with clinical  observations, patient history, and epidemiological information. If result is POSITIVE SARS-CoV-2 target nucleic acids are DETECTED. The SARS-CoV-2 RNA is generally detectable in upper and lower  respiratory specimens dur ing the acute phase of infection.  Positive  results are indicative of active infection with SARS-CoV-2.  Clinical  correlation with patient history and other diagnostic information is  necessary to determine patient infection status.  Positive results do  not rule out bacterial infection or co-infection with  other viruses. If result is PRESUMPTIVE POSTIVE SARS-CoV-2 nucleic acids MAY BE PRESENT.   A presumptive positive result was obtained on the submitted specimen  and confirmed on repeat testing.  While 2019 novel coronavirus  (SARS-CoV-2) nucleic acids may be present in the submitted sample  additional confirmatory testing may be necessary for epidemiological  and / or clinical management purposes  to differentiate between  SARS-CoV-2 and other Sarbecovirus currently known to infect humans.  If clinically indicated additional testing with an alternate test  methodology [phone]) is advised. The SARS-CoV-2 RNA is generally  detectable in upper and lower respiratory sp ecimens during the acute  phase of infection. The expected result is Negative. Fact Sheet for Patients:  StrictlyIdeas.no Fact Sheet for Healthcare Providers: BankingDealers.co.za This test is not yet approved or cleared by the [place] FDA and has been authorized for detection and/or diagnosis of SARS-CoV-2 by FDA under an Emergency Use Authorization (EUA).  This EUA will remain in effect (meaning this test can be used) for the duration of the COVID-19 declaration under Section 564(b)(1) of the Act, 21 U.S.C. section 360bbb-3(b)(1), unless the authorization is terminated or revoked sooner. Performed at [facility], [address], [place], [place] [address]   Culture, blood (Routine X 2) w Reflex to ID Panel     Status: None (Preliminary result)   Collection Time: 10/24/18  2:21 AM   Specimen: BLOOD  Result Value Ref Range Status   Specimen Description BLOOD  Final   Special Requests NONE  Final   Culture   Final    NO GROWTH 4 DAYS Performed at [facility], [address], [place], [place] [address]    Report Status PENDING  Incomplete  Culture, blood (Routine X 2) w Reflex to ID Panel     Status: None (Preliminary result)   Collection Time: 10/24/18  2:27 AM    Specimen: BLOOD  Result Value Ref Range Status   Specimen Description BLOOD  Final   Special Requests NONE  Final   Culture   Final    NO GROWTH 4 DAYS Performed at [facility], [address] [place], [place] [address]    Report Status PENDING  Incomplete  MRSA PCR Screening     Status: None   Collection Time: 10/24/18  7:57 AM   Specimen: Nasopharyngeal  Result Value Ref Range Status   MRSA by PCR NEGATIVE NEGATIVE Final    Comment:        The GeneXpert MRSA Assay (FDA approved for NASAL specimens only), is one component of a comprehensive MRSA colonization surveillance program. It is not intended to diagnose MRSA infection nor to guide or monitor treatment for MRSA infections. Performed at [facility], [address], [place], [place] [address]   Culture, Urine     Status: None   Collection Time: 10/26/18 10:38 AM   Specimen: Urine, Random  Result Value Ref Range Status   Specimen Description   Final    URINE, RANDOM Performed at [facility], [address] [place], [place] [address]    Special Requests   Final    NONE Performed at [facility], [address] [place], [place] [address]    Culture   Final    NO GROWTH Performed at [facility], [place] [address] [place], [place] [address]    Report Status 10/28/2018 FINAL  Final     Labs: BNP (last 3 results) Recent Labs    08/20/18 1228 09/26/18 1419 10/24/18 0000  BNP 889.0* 302.0* 196.2*   Basic Metabolic Panel: Recent Labs  Lab 10/24/18 0000 10/24/18 0221 10/25/18 0536 10/26/18 0650 10/27/18 0647 10/28/18 0426  NA 143 145 138 139 142  --   K 4.3 3.7 4.9 4.7 4.4  --   CL 93* 92* 91* 91* 94*  --   CO2 41* 39* 36* 35* 37*  --   GLUCOSE 201* 184* 299* 233* 111*  --   BUN 19 17 17 20 18   --   CREATININE 0.64 0.60 0.62 0.67 0.62 0.72  CALCIUM 9.4 9.4 9.4 10.1 10.1  --    Liver Function Tests: Recent Labs  Lab 10/24/18 0221  AST 15  ALT 21  ALKPHOS 76  BILITOT 0.7  PROT 6.6   ALBUMIN 3.9   No results for input(s): LIPASE, AMYLASE in the last 168 hours. Recent Labs  Lab 10/26/18 1322  AMMONIA 17   CBC: Recent Labs  Lab 10/24/18 0000 10/24/18 0221 10/25/18 0536 10/27/18 0647  WBC 11.5* 9.8 11.7* 13.6*  NEUTROABS 9.0*  --   --   --   HGB 11.1* 11.1* 10.9* 12.0  HCT 40.4 40.1 38.1 42.0  MCV 100.2* 101.3* 99.2 98.6  PLT 265 254 291 332   Cardiac Enzymes: No results for input(s): CKTOTAL, CKMB, CKMBINDEX, TROPONINI in the last 168 hours. BNP: Invalid input(s): POCBNP CBG: Recent Labs  Lab 10/27/18 1135 10/27/18 1615 10/27/18 1655 10/27/18 2146 10/28/18 0807  GLUCAP 177* 52* 90 240* 177*   D-Dimer No results for input(s): DDIMER in the last 72 hours. Hgb A1c No results for input(s): HGBA1C in the last 72 hours. Lipid Profile No results for input(s): CHOL, HDL, LDLCALC, TRIG, CHOLHDL, LDLDIRECT in the last 72 hours. Thyroid function studies Recent Labs    10/26/18 1322  TSH 0.831   Anemia work up No results for input(s): VITAMINB12, FOLATE, FERRITIN, TIBC, IRON, RETICCTPCT in the last 72 hours. Urinalysis    Component Value Date/Time   COLORURINE YELLOW 10/26/2018 1038   APPEARANCEUR HAZY (A) 10/26/2018 1038   LABSPEC 1.026 10/26/2018 1038   PHURINE 5.0 10/26/2018 1038   GLUCOSEU 150 (A) 10/26/2018 1038   HGBUR NEGATIVE 10/26/2018 1038  BILIRUBINUR NEGATIVE 10/26/2018 1038   KETONESUR NEGATIVE 10/26/2018 1038   PROTEINUR 100 (A) 10/26/2018 1038   UROBILINOGEN 0.2 09/09/2009 1010   NITRITE NEGATIVE 10/26/2018 1038   LEUKOCYTESUR NEGATIVE 10/26/2018 1038   Sepsis Labs Invalid input(s): PROCALCITONIN,  WBC,  LACTICIDVEN Microbiology Recent Results (from the past 240 hour(s))  SARS Coronavirus 2 [facility] order, Performed in [facility] hospital lab) Nasopharyngeal Nasopharyngeal Swab     Status: None   Collection Time: 10/18/18  7:20 PM   Specimen: Nasopharyngeal Swab  Result Value Ref Range Status   SARS Coronavirus 2  NEGATIVE NEGATIVE Final    Comment: (NOTE) If result is NEGATIVE SARS-CoV-2 target nucleic acids are NOT DETECTED. The SARS-CoV-2 RNA is generally detectable in upper and lower  respiratory specimens during the acute phase of infection. The lowest  concentration of SARS-CoV-2 viral copies this assay can detect is 250  copies / mL. A negative result does not preclude SARS-CoV-2 infection  and should not be used as the sole basis for treatment or other  patient management decisions.  A negative result may occur with  improper specimen collection / handling, submission of specimen other  than nasopharyngeal swab, presence of viral mutation(s) within the  areas targeted by this assay, and inadequate number of viral copies  (<250 copies / mL). A negative result must be combined with clinical  observations, patient history, and epidemiological information. If result is POSITIVE SARS-CoV-2 target nucleic acids are DETECTED. The SARS-CoV-2 RNA is generally detectable in upper and lower  respiratory specimens dur ing the acute phase of infection.  Positive  results are indicative of active infection with SARS-CoV-2.  Clinical  correlation with patient history and other diagnostic information is  necessary to determine patient infection status.  Positive results do  not rule out bacterial infection or co-infection with other viruses. If result is PRESUMPTIVE POSTIVE SARS-CoV-2 nucleic acids MAY BE PRESENT.   A presumptive positive result was obtained on the submitted specimen  and confirmed on repeat testing.  While 2019 novel coronavirus  (SARS-CoV-2) nucleic acids may be present in the submitted sample  additional confirmatory testing may be necessary for epidemiological  and / or clinical management purposes  to differentiate between  SARS-CoV-2 and other Sarbecovirus currently known to infect humans.  If clinically indicated additional testing with an alternate test  methodology [phone])  is advised. The SARS-CoV-2 RNA is generally  detectable in upper and lower respiratory sp ecimens during the acute  phase of infection. The expected result is Negative. Fact Sheet for Patients:  StrictlyIdeas.no Fact Sheet for Healthcare Providers: BankingDealers.co.za This test is not yet approved or cleared by the [place] FDA and has been authorized for detection and/or diagnosis of SARS-CoV-2 by FDA under an Emergency Use Authorization (EUA).  This EUA will remain in effect (meaning this test can be used) for the duration of the COVID-19 declaration under Section 564(b)(1) of the Act, 21 U.S.C. section 360bbb-3(b)(1), unless the authorization is terminated or revoked sooner. Performed at [facility], [address] [place], [place] [address]   Culture, blood (Routine X 2) w Reflex to ID Panel     Status: None   Collection Time: 10/18/18 11:43 PM   Specimen: Left Antecubital; Blood  Result Value Ref Range Status   Specimen Description LEFT ANTECUBITAL  Final   Special Requests   Final    BOTTLES DRAWN AEROBIC AND ANAEROBIC Blood Culture adequate volume   Culture   Final    NO GROWTH  5 DAYS Performed at [facility], [address] [place], [place] [address]    Report Status 10/24/2018 FINAL  Final  Culture, blood (Routine X 2) w Reflex to ID Panel     Status: None   Collection Time: 10/19/18 12:00 AM   Specimen: BLOOD LEFT WRIST  Result Value Ref Range Status   Specimen Description BLOOD LEFT WRIST  Final   Special Requests   Final    BOTTLES DRAWN AEROBIC AND ANAEROBIC Blood Culture adequate volume   Culture   Final    NO GROWTH 5 DAYS Performed at [facility], [address] [place], [place] [address]    Report Status 10/24/2018 FINAL  Final  MRSA PCR Screening     Status: None   Collection Time: 10/19/18  6:04 AM   Specimen: Nasal Mucosa; Nasopharyngeal  Result Value Ref Range Status   MRSA by PCR NEGATIVE  NEGATIVE Final    Comment:        The GeneXpert MRSA Assay (FDA approved for NASAL specimens only), is one component of a comprehensive MRSA colonization surveillance program. It is not intended to diagnose MRSA infection nor to guide or monitor treatment for MRSA infections. Performed at [facility], [address] [place], [place] [address]   SARS Coronavirus 2 [facility] order, Performed in [facility] hospital lab) Nasopharyngeal Nasopharyngeal Swab     Status: None   Collection Time: 10/23/18 11:14 PM   Specimen: Nasopharyngeal Swab  Result Value Ref Range Status   SARS Coronavirus 2 NEGATIVE NEGATIVE Final    Comment: (NOTE) If result is NEGATIVE SARS-CoV-2 target nucleic acids are NOT DETECTED. The SARS-CoV-2 RNA is generally detectable in upper and lower  respiratory specimens during the acute phase of infection. The lowest  concentration of SARS-CoV-2 viral copies this assay can detect is 250  copies / mL. A negative result does not preclude SARS-CoV-2 infection  and should not be used as the sole basis for treatment or other  patient management decisions.  A negative result may occur with  improper specimen collection / handling, submission of specimen other  than nasopharyngeal swab, presence of viral mutation(s) within the  areas targeted by this assay, and inadequate number of viral copies  (<250 copies / mL). A negative result must be combined with clinical  observations, patient history, and epidemiological information. If result is POSITIVE SARS-CoV-2 target nucleic acids are DETECTED. The SARS-CoV-2 RNA is generally detectable in upper and lower  respiratory specimens dur ing the acute phase of infection.  Positive  results are indicative of active infection with SARS-CoV-2.  Clinical  correlation with patient history and other diagnostic information is  necessary to determine patient infection status.  Positive results do  not rule out bacterial infection or  co-infection with other viruses. If result is PRESUMPTIVE POSTIVE SARS-CoV-2 nucleic acids MAY BE PRESENT.   A presumptive positive result was obtained on the submitted specimen  and confirmed on repeat testing.  While 2019 novel coronavirus  (SARS-CoV-2) nucleic acids may be present in the submitted sample  additional confirmatory testing may be necessary for epidemiological  and / or clinical management purposes  to differentiate between  SARS-CoV-2 and other Sarbecovirus currently known to infect humans.  If clinically indicated additional testing with an alternate test  methodology [phone]) is advised. The SARS-CoV-2 RNA is generally  detectable in upper and lower respiratory sp ecimens during the acute  phase of infection. The expected result is Negative. Fact Sheet for Patients:  StrictlyIdeas.no Fact Sheet for Healthcare Providers: BankingDealers.co.za This test is not yet approved or cleared by the [place] FDA and has been authorized for detection and/or diagnosis of SARS-CoV-2 by FDA under an Emergency Use Authorization (EUA).  This EUA will remain in effect (meaning this test can be used) for the duration of the COVID-19 declaration under Section 564(b)(1) of the Act, 21 U.S.C. section 360bbb-3(b)(1), unless the authorization is terminated or revoked sooner. Performed at [facility], [address] [place], [place] [address]   Culture, blood (Routine X 2) w Reflex to ID Panel     Status: None (Preliminary result)   Collection Time: 10/24/18  2:21 AM   Specimen: BLOOD  Result Value Ref Range Status   Specimen Description BLOOD  Final   Special Requests NONE  Final   Culture   Final    NO GROWTH 4 DAYS Performed at [facility], [address], [place], [place] [address]    Report Status PENDING  Incomplete  Culture, blood (Routine X 2) w Reflex to ID Panel     Status: None (Preliminary result)   Collection Time:  10/24/18  2:27 AM   Specimen: BLOOD  Result Value Ref Range Status   Specimen Description BLOOD  Final   Special Requests NONE  Final   Culture   Final    NO GROWTH 4 DAYS Performed at [facility], [address], [place], [place] [address]    Report Status PENDING  Incomplete  MRSA PCR Screening     Status: None   Collection Time: 10/24/18  7:57 AM   Specimen: Nasopharyngeal  Result Value Ref Range Status   MRSA by PCR NEGATIVE NEGATIVE Final    Comment:        The GeneXpert MRSA Assay (FDA approved for NASAL specimens only), is one component of a comprehensive MRSA colonization surveillance program. It is not intended to diagnose MRSA infection nor to guide or monitor treatment for MRSA infections. Performed at [facility], [address] [place], [place] [address]   Culture, Urine     Status: None   Collection Time: 10/26/18 10:38 AM   Specimen: Urine, Random  Result Value Ref Range Status   Specimen Description   Final    URINE, RANDOM Performed at [facility], [address] [place], [place] [address]    Special Requests   Final    NONE Performed at [facility], [address], [place], [place] [address]    Culture   Final    NO GROWTH Performed at [facility], [place] [address] [place], [place] [address]    Report Status 10/28/2018 FINAL  Final     Time coordinating discharge: 35 minutes  SIGNED:   Rodena Goldmann, DO [facility] 10/28/2018, 10:48 AM  If 7PM-7AM, please contact night-coverage [email or web address] Password [email or web address]

## 2018-10-29 ENCOUNTER — Other Ambulatory Visit: Payer: Self-pay | Admitting: *Deleted

## 2018-10-29 NOTE — Patient Outreach (Signed)
Outreach call to pt for post hospital follow up,  Pt hospitalized 8/12-8/17/20 for COPD exacerbation.  No answer to telephone and no option to leave voicemail.  RN CM mailed unsuccessful outreach letter to pt home.  PLAN Outreach pt in 3-4 business days  Jacqlyn Larsen [facility], [place] [phone]

## 2018-10-30 LAB — CULTURE, BLOOD (ROUTINE X 2)
Culture: NO GROWTH
Culture: NO GROWTH
Special Requests: ADEQUATE
Special Requests: ADEQUATE

## 2018-10-31 DIAGNOSIS — I10 Essential (primary) hypertension: Secondary | ICD-10-CM | POA: Diagnosis not present

## 2018-10-31 DIAGNOSIS — G9341 Metabolic encephalopathy: Secondary | ICD-10-CM | POA: Diagnosis not present

## 2018-10-31 DIAGNOSIS — I480 Paroxysmal atrial fibrillation: Secondary | ICD-10-CM | POA: Diagnosis not present

## 2018-10-31 DIAGNOSIS — J449 Chronic obstructive pulmonary disease, unspecified: Secondary | ICD-10-CM | POA: Diagnosis not present

## 2018-11-01 ENCOUNTER — Other Ambulatory Visit: Payer: Self-pay | Admitting: *Deleted

## 2018-11-01 NOTE — Patient Outreach (Signed)
Outreach call to pt for post hospital follow up (2nd attempt)   No answer to telephone, left voicemail requesting return phone call.  PLAN Outreach pt in 3-4 business days  Jacqlyn Larsen [place], [place] [phone]

## 2018-11-06 ENCOUNTER — Emergency Department (HOSPITAL_COMMUNITY): Payer: Medicare Other

## 2018-11-06 ENCOUNTER — Encounter (HOSPITAL_COMMUNITY): Payer: Self-pay | Admitting: *Deleted

## 2018-11-06 ENCOUNTER — Inpatient Hospital Stay (HOSPITAL_COMMUNITY)
Admission: EM | Admit: 2018-11-06 | Discharge: 2018-11-11 | DRG: 177 | Disposition: A | Discharge status: Home Health | Payer: Medicare Other | Attending: Internal Medicine | Admitting: Internal Medicine

## 2018-11-06 ENCOUNTER — Other Ambulatory Visit: Payer: Self-pay

## 2018-11-06 DIAGNOSIS — M810 Age-related osteoporosis without current pathological fracture: Secondary | ICD-10-CM | POA: Diagnosis present

## 2018-11-06 DIAGNOSIS — E010 Iodine-deficiency related diffuse (endemic) goiter: Secondary | ICD-10-CM | POA: Diagnosis not present

## 2018-11-06 DIAGNOSIS — R404 Transient alteration of awareness: Secondary | ICD-10-CM | POA: Diagnosis not present

## 2018-11-06 DIAGNOSIS — Z87891 Personal history of nicotine dependence: Secondary | ICD-10-CM

## 2018-11-06 DIAGNOSIS — Z515 Encounter for palliative care: Secondary | ICD-10-CM | POA: Diagnosis not present

## 2018-11-06 DIAGNOSIS — Z8701 Personal history of pneumonia (recurrent): Secondary | ICD-10-CM

## 2018-11-06 DIAGNOSIS — G92 Toxic encephalopathy: Secondary | ICD-10-CM | POA: Diagnosis present

## 2018-11-06 DIAGNOSIS — J9621 Acute and chronic respiratory failure with hypoxia: Secondary | ICD-10-CM | POA: Diagnosis present

## 2018-11-06 DIAGNOSIS — E1065 Type 1 diabetes mellitus with hyperglycemia: Secondary | ICD-10-CM | POA: Diagnosis not present

## 2018-11-06 DIAGNOSIS — Z9981 Dependence on supplemental oxygen: Secondary | ICD-10-CM | POA: Diagnosis not present

## 2018-11-06 DIAGNOSIS — J449 Chronic obstructive pulmonary disease, unspecified: Secondary | ICD-10-CM

## 2018-11-06 DIAGNOSIS — Z7901 Long term (current) use of anticoagulants: Secondary | ICD-10-CM | POA: Diagnosis not present

## 2018-11-06 DIAGNOSIS — I959 Hypotension, unspecified: Secondary | ICD-10-CM | POA: Diagnosis not present

## 2018-11-06 DIAGNOSIS — J4489 Other specified chronic obstructive pulmonary disease: Secondary | ICD-10-CM

## 2018-11-06 DIAGNOSIS — E876 Hypokalemia: Secondary | ICD-10-CM | POA: Diagnosis not present

## 2018-11-06 DIAGNOSIS — R069 Unspecified abnormalities of breathing: Secondary | ICD-10-CM | POA: Diagnosis not present

## 2018-11-06 DIAGNOSIS — Z7189 Other specified counseling: Secondary | ICD-10-CM | POA: Diagnosis not present

## 2018-11-06 DIAGNOSIS — Z20828 Contact with and (suspected) exposure to other viral communicable diseases: Secondary | ICD-10-CM | POA: Diagnosis present

## 2018-11-06 DIAGNOSIS — Z801 Family history of malignant neoplasm of trachea, bronchus and lung: Secondary | ICD-10-CM

## 2018-11-06 DIAGNOSIS — R0902 Hypoxemia: Secondary | ICD-10-CM | POA: Diagnosis not present

## 2018-11-06 DIAGNOSIS — K5790 Diverticulosis of intestine, part unspecified, without perforation or abscess without bleeding: Secondary | ICD-10-CM | POA: Diagnosis not present

## 2018-11-06 DIAGNOSIS — J9622 Acute and chronic respiratory failure with hypercapnia: Secondary | ICD-10-CM | POA: Diagnosis not present

## 2018-11-06 DIAGNOSIS — F05 Delirium due to known physiological condition: Secondary | ICD-10-CM | POA: Diagnosis present

## 2018-11-06 DIAGNOSIS — Z9011 Acquired absence of right breast and nipple: Secondary | ICD-10-CM

## 2018-11-06 DIAGNOSIS — Y92009 Unspecified place in unspecified non-institutional (private) residence as the place of occurrence of the external cause: Secondary | ICD-10-CM | POA: Diagnosis not present

## 2018-11-06 DIAGNOSIS — Z885 Allergy status to narcotic agent status: Secondary | ICD-10-CM

## 2018-11-06 DIAGNOSIS — J441 Chronic obstructive pulmonary disease with (acute) exacerbation: Secondary | ICD-10-CM | POA: Diagnosis present

## 2018-11-06 DIAGNOSIS — I5033 Acute on chronic diastolic (congestive) heart failure: Secondary | ICD-10-CM | POA: Diagnosis not present

## 2018-11-06 DIAGNOSIS — R0602 Shortness of breath: Secondary | ICD-10-CM | POA: Diagnosis not present

## 2018-11-06 DIAGNOSIS — Z781 Physical restraint status: Secondary | ICD-10-CM | POA: Diagnosis not present

## 2018-11-06 DIAGNOSIS — F03918 Unspecified dementia, unspecified severity, with other behavioral disturbance: Secondary | ICD-10-CM

## 2018-11-06 DIAGNOSIS — I110 Hypertensive heart disease with heart failure: Secondary | ICD-10-CM | POA: Diagnosis not present

## 2018-11-06 DIAGNOSIS — Z794 Long term (current) use of insulin: Secondary | ICD-10-CM

## 2018-11-06 DIAGNOSIS — J690 Pneumonitis due to inhalation of food and vomit: Secondary | ICD-10-CM | POA: Diagnosis not present

## 2018-11-06 DIAGNOSIS — R443 Hallucinations, unspecified: Secondary | ICD-10-CM | POA: Diagnosis not present

## 2018-11-06 DIAGNOSIS — R4020 Unspecified coma: Secondary | ICD-10-CM | POA: Diagnosis not present

## 2018-11-06 DIAGNOSIS — T424X5A Adverse effect of benzodiazepines, initial encounter: Secondary | ICD-10-CM | POA: Diagnosis present

## 2018-11-06 DIAGNOSIS — I10 Essential (primary) hypertension: Secondary | ICD-10-CM | POA: Diagnosis not present

## 2018-11-06 DIAGNOSIS — E875 Hyperkalemia: Secondary | ICD-10-CM | POA: Diagnosis present

## 2018-11-06 DIAGNOSIS — Z9114 Patient's other noncompliance with medication regimen: Secondary | ICD-10-CM | POA: Diagnosis not present

## 2018-11-06 DIAGNOSIS — R531 Weakness: Secondary | ICD-10-CM | POA: Diagnosis not present

## 2018-11-06 DIAGNOSIS — I480 Paroxysmal atrial fibrillation: Secondary | ICD-10-CM | POA: Diagnosis not present

## 2018-11-06 DIAGNOSIS — Z853 Personal history of malignant neoplasm of breast: Secondary | ICD-10-CM

## 2018-11-06 DIAGNOSIS — R4182 Altered mental status, unspecified: Secondary | ICD-10-CM

## 2018-11-06 DIAGNOSIS — F0391 Unspecified dementia with behavioral disturbance: Secondary | ICD-10-CM | POA: Diagnosis present

## 2018-11-06 DIAGNOSIS — I4892 Unspecified atrial flutter: Secondary | ICD-10-CM | POA: Diagnosis present

## 2018-11-06 DIAGNOSIS — J189 Pneumonia, unspecified organism: Secondary | ICD-10-CM

## 2018-11-06 DIAGNOSIS — Z79899 Other long term (current) drug therapy: Secondary | ICD-10-CM

## 2018-11-06 DIAGNOSIS — Z833 Family history of diabetes mellitus: Secondary | ICD-10-CM

## 2018-11-06 LAB — CBC WITH DIFFERENTIAL/PLATELET
Abs Immature Granulocytes: 0.08 10*3/uL — ABNORMAL HIGH (ref 0.00–0.07)
Basophils Absolute: 0 10*3/uL (ref 0.0–0.1)
Basophils Relative: 0 %
Eosinophils Absolute: 0.1 10*3/uL (ref 0.0–0.5)
Eosinophils Relative: 1 %
HCT: 39 % (ref 36.0–46.0)
Hemoglobin: 10.7 g/dL — ABNORMAL LOW (ref 12.0–15.0)
Immature Granulocytes: 1 %
Lymphocytes Relative: 9 %
Lymphs Abs: 1.3 10*3/uL (ref 0.7–4.0)
MCH: 27.9 pg (ref 26.0–34.0)
MCHC: 27.4 g/dL — ABNORMAL LOW (ref 30.0–36.0)
MCV: 101.6 fL — ABNORMAL HIGH (ref 80.0–100.0)
Monocytes Absolute: 1.1 10*3/uL — ABNORMAL HIGH (ref 0.1–1.0)
Monocytes Relative: 8 %
Neutro Abs: 12.3 10*3/uL — ABNORMAL HIGH (ref 1.7–7.7)
Neutrophils Relative %: 81 %
Platelets: 307 10*3/uL (ref 150–400)
RBC: 3.84 MIL/uL — ABNORMAL LOW (ref 3.87–5.11)
RDW: 14.6 % (ref 11.5–15.5)
WBC: 14.9 10*3/uL — ABNORMAL HIGH (ref 4.0–10.5)
nRBC: 0 % (ref 0.0–0.2)

## 2018-11-06 LAB — URINALYSIS, ROUTINE W REFLEX MICROSCOPIC
Bilirubin Urine: NEGATIVE
Glucose, UA: NEGATIVE mg/dL
Hgb urine dipstick: NEGATIVE
Ketones, ur: NEGATIVE mg/dL
Nitrite: POSITIVE — AB
Protein, ur: 30 mg/dL — AB
Specific Gravity, Urine: 1.009 (ref 1.005–1.030)
pH: 5 (ref 5.0–8.0)

## 2018-11-06 LAB — COMPREHENSIVE METABOLIC PANEL
ALT: 26 U/L (ref 0–44)
AST: 24 U/L (ref 15–41)
Albumin: 3.8 g/dL (ref 3.5–5.0)
Alkaline Phosphatase: 89 U/L (ref 38–126)
Anion gap: 10 (ref 5–15)
BUN: 14 mg/dL (ref 8–23)
CO2: 37 mmol/L — ABNORMAL HIGH (ref 22–32)
Calcium: 9.3 mg/dL (ref 8.9–10.3)
Chloride: 91 mmol/L — ABNORMAL LOW (ref 98–111)
Creatinine, Ser: 0.8 mg/dL (ref 0.44–1.00)
GFR calc Af Amer: >60 mL/min (ref >60)
GFR calc non Af Amer: >60 mL/min (ref >60)
Glucose, Bld: 180 mg/dL — ABNORMAL HIGH (ref 70–99)
Potassium: 5 mmol/L (ref 3.5–5.1)
Sodium: 138 mmol/L (ref 135–145)
Total Bilirubin: 0.6 mg/dL (ref 0.3–1.2)
Total Protein: 6.8 g/dL (ref 6.5–8.1)

## 2018-11-06 LAB — CBG MONITORING, ED: Glucose-Capillary: 174 mg/dL — ABNORMAL HIGH (ref 70–99)

## 2018-11-06 LAB — PHOSPHORUS: Phosphorus: 3.7 mg/dL (ref 2.5–4.6)

## 2018-11-06 LAB — SARS CORONAVIRUS 2 BY RT PCR (HOSPITAL ORDER, PERFORMED IN ~~LOC~~ HOSPITAL LAB): SARS Coronavirus 2: NEGATIVE

## 2018-11-06 LAB — MAGNESIUM: Magnesium: 2 mg/dL (ref 1.7–2.4)

## 2018-11-06 MED ORDER — VANCOMYCIN HCL 1.5 G IV SOLR
1500.0000 mg | Freq: Once | INTRAVENOUS | Status: DC
  Administered 2018-11-06: 1500 mg via INTRAVENOUS
  Filled 2018-11-06: qty 1500

## 2018-11-06 MED ORDER — AMIODARONE HCL 200 MG PO TABS
200.0000 mg | ORAL_TABLET | Freq: Every day | ORAL | Status: DC
  Administered 2018-11-08 – 2018-11-11 (×4): 200 mg via ORAL
  Filled 2018-11-06 (×5): qty 1

## 2018-11-06 MED ORDER — METOPROLOL TARTRATE 50 MG PO TABS
50.0000 mg | ORAL_TABLET | Freq: Two times a day (BID) | ORAL | Status: DC
  Administered 2018-11-07 – 2018-11-11 (×5): 50 mg via ORAL
  Filled 2018-11-06 (×7): qty 1

## 2018-11-06 MED ORDER — SODIUM CHLORIDE 0.9 % IV SOLN
500.0000 mg | INTRAVENOUS | Status: DC
  Administered 2018-11-07 – 2018-11-08 (×2): 500 mg via INTRAVENOUS
  Filled 2018-11-06 (×2): qty 500

## 2018-11-06 MED ORDER — POLYETHYLENE GLYCOL 3350 17 G PO PACK: 17.0000 g | PACK | Freq: Every day | ORAL | Status: DC | PRN

## 2018-11-06 MED ORDER — ALBUTEROL SULFATE (2.5 MG/3ML) 0.083% IN NEBU: 3.0000 mL | INHALATION_SOLUTION | Freq: Four times a day (QID) | RESPIRATORY_TRACT | Status: DC | PRN

## 2018-11-06 MED ORDER — SODIUM CHLORIDE 0.9 % IV SOLN
2.0000 g | INTRAVENOUS | Status: DC
  Administered 2018-11-07 (×2): 2 g via INTRAVENOUS
  Filled 2018-11-06 (×2): qty 20

## 2018-11-06 MED ORDER — DOCUSATE SODIUM 100 MG PO CAPS
100.0000 mg | ORAL_CAPSULE | Freq: Every day | ORAL | Status: DC
  Administered 2018-11-08 – 2018-11-10 (×3): 100 mg via ORAL
  Filled 2018-11-06 (×5): qty 1

## 2018-11-06 MED ORDER — FLUTICASONE-UMECLIDIN-VILANT 100-62.5-25 MCG/INH IN AEPB: 1.0000 | INHALATION_SPRAY | Freq: Every day | RESPIRATORY_TRACT | Status: DC

## 2018-11-06 MED ORDER — METHYLPREDNISOLONE SODIUM SUCC 125 MG IJ SOLR
80.0000 mg | Freq: Once | INTRAMUSCULAR | Status: AC
  Administered 2018-11-06: 80 mg via INTRAVENOUS
  Filled 2018-11-06: qty 2

## 2018-11-06 MED ORDER — SODIUM CHLORIDE 0.9 % IV SOLN: 2.0000 g | Freq: Two times a day (BID) | INTRAVENOUS | Status: DC

## 2018-11-06 MED ORDER — ALBUTEROL SULFATE (2.5 MG/3ML) 0.083% IN NEBU: 2.5000 mg | INHALATION_SOLUTION | Freq: Four times a day (QID) | RESPIRATORY_TRACT | Status: DC | PRN

## 2018-11-06 MED ORDER — INSULIN ASPART 100 UNIT/ML ~~LOC~~ SOLN
0.0000 [IU] | Freq: Three times a day (TID) | SUBCUTANEOUS | Status: DC
  Administered 2018-11-07 – 2018-11-08 (×3): 2 [IU] via SUBCUTANEOUS
  Administered 2018-11-09 – 2018-11-10 (×2): 3 [IU] via SUBCUTANEOUS
  Administered 2018-11-10: 1 [IU] via SUBCUTANEOUS
  Administered 2018-11-11: 2 [IU] via SUBCUTANEOUS

## 2018-11-06 MED ORDER — LORAZEPAM 2 MG/ML IJ SOLN
1.0000 mg | Freq: Once | INTRAMUSCULAR | Status: AC
  Administered 2018-11-06: 1 mg via INTRAVENOUS
  Filled 2018-11-06: qty 1

## 2018-11-06 MED ORDER — RIVAROXABAN 20 MG PO TABS: 20.0000 mg | ORAL_TABLET | Freq: Every day | ORAL | Status: DC

## 2018-11-06 MED ORDER — IPRATROPIUM-ALBUTEROL 0.5-2.5 (3) MG/3ML IN SOLN
3.0000 mL | Freq: Once | RESPIRATORY_TRACT | Status: AC
  Administered 2018-11-06: 3 mL via RESPIRATORY_TRACT
  Filled 2018-11-06: qty 3

## 2018-11-06 MED ORDER — FUROSEMIDE 40 MG PO TABS
60.0000 mg | ORAL_TABLET | ORAL | Status: DC
  Filled 2018-11-06: qty 2

## 2018-11-06 NOTE — ED Notes (Signed)
This RN placed pt in mitts due to her continuously pulling at lines and removing her pulse ox and oxygen.

## 2018-11-06 NOTE — ED Provider Notes (Addendum)
[facility] EMERGENCY DEPARTMENT Provider Note   CSN: [id number] Arrival date & time: 11/06/18  1550     History   Chief Complaint Chief Complaint  Patient presents with   Shortness of Breath    HPI Eileen [name] is a 73 y.o. female.     Level 5 caveat for uncertain history.  Chief complaint shortness of breath.  Patient has COPD and is on home oxygen.  Recent admission in August for HCAP discharged on 10/28/2018.  She apparently is not taking her Lasix correctly.  EMS states a brief episode at 1540 where she stopped talking and became lethargic.  In the emergency department, she appears to be answering questions appropriately.     Past Medical History:  Diagnosis Date   Asthma    Atrial flutter ([place])    Cancer ([place])    Right breast   COPD (chronic obstructive pulmonary disease) ([place])    History of breast cancer    right breast   Hypertension    On home O2    Type 2 diabetes mellitus ([place])     Patient Active Problem List   Diagnosis Date Noted   Bronchiectasis with acute exacerbation ([place])    COPD exacerbation ([place]) 10/18/2018   Anemia 10/18/2018   Osteoporosis 08/30/2018   Closed compression fracture of body of L1 vertebra (HCC)    Constipation    Acute and chronic respiratory failure (acute-on-chronic) ([place]) 08/10/2018   Left lower lobe pneumonia ([place]) 08/10/2018   Leukocytosis 08/10/2018   Chronic respiratory failure with hypoxia ([place]) 06/28/2018   Acute upper GI bleeding 01/20/2018   Lower GI bleed 01/20/2018   Acute respiratory failure with hypoxia ([place]) 09/17/2017   Diabetes mellitus ([place]) 08/31/2017   Acute hypoxemic respiratory failure ([place]) 08/31/2017   Hemorrhoids 10/30/2016   Atrial fibrillation with RVR ([place]) 09/24/2016   Atrial flutter ([place]) 09/23/2016   Obstructive chronic bronchitis with exacerbation ([place]) 05/23/2016   Vitamin D deficiency 05/23/2016   Acute on chronic respiratory failure ([place]) Q000111Q    Metabolic encephalopathy Q000111Q   HCAP (healthcare-associated pneumonia) 03/24/2015   Anticoagulation management encounter    New onset atrial flutter ([place]) 03/21/2015   Obesity 03/21/2015   Atrial flutter with rapid ventricular response ([place]) 03/21/2015   COPD (chronic obstructive pulmonary disease) ([place]) 12/20/2014   COPD with acute exacerbation ([place]) 12/20/2014   DCIS (ductal carcinoma in situ) of breast, right, 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, Arimadex 03/10/2011   Breast cancer ([place]) 10/11/2009    Past Surgical History:  Procedure Laterality Date   BREAST SURGERY  2011   Right breast mastectomy   CARDIOVERSION N/A 06/27/2018   Procedure: CARDIOVERSION;  Surgeon: Arnoldo Lenis, MD;  Location[facility];  Service: Endoscopy;  Laterality: N/A;   CESAREAN SECTION     COLONOSCOPY N/A 01/22/2018   Procedure: COLONOSCOPY;  Surgeon: Rogene Houston, MD;  Location: [facility];  Service: Endoscopy;  Laterality: N/A;   HERNIA REPAIR     RIH   IR VERTEBROPLASTY LUMBAR BX INC UNI/BIL INC/INJECT/IMAGING  09/05/2018   MASTECTOMY  2011   right breast   TEE WITHOUT CARDIOVERSION N/A 06/27/2018   Procedure: TRANSESOPHAGEAL ECHOCARDIOGRAM (TEE) WITH PROPOFOL;  Surgeon: Arnoldo Lenis, MD;  Location: [facility];  Service: Endoscopy;  Laterality: N/A;     OB History    Gravida      Para      Term      Preterm      AB  Living  5     SAB      TAB      Ectopic      Multiple      Live Births               Home Medications    Prior to Admission medications   Medication Sig Start Date End Date Taking? Authorizing Provider  acetaminophen (TYLENOL) 500 MG tablet Take 500 mg by mouth every 8 (eight) hours as needed for mild pain or headache.     Lavella Lemons, PA  albuterol (PROVENTIL) (2.5 MG/3ML) 0.083% nebulizer solution Take 2.5 mg by nebulization every 6 (six) hours as needed for wheezing or shortness of breath.    [provider]  amiodarone (PACERONE) 200 MG tablet Take 1 tablet (200 mg total) by mouth daily. 07/20/18   Strader, Fransisco Hertz, PA-C  docusate sodium (COLACE) 100 MG capsule Take 1 capsule (100 mg total) by mouth 2 (two) times daily. Patient taking differently: Take 100 mg by mouth daily.  08/22/18   Barton Dubois, MD  furosemide (LASIX) 40 MG tablet Take 1.5 tablets (60 mg total) by mouth daily. Patient taking differently: Take 60 mg by mouth every other day.  10/21/18   Barton Dubois, MD  guaiFENesin (MUCINEX) 600 MG 12 hr tablet Take 1 tablet (600 mg total) by mouth 2 (two) times daily. 08/22/18   Barton Dubois, MD  metFORMIN (GLUCOPHAGE-XR) 500 MG 24 hr tablet Take 500 mg by mouth daily with breakfast.  08/10/17   [provider]  metoprolol tartrate (LOPRESSOR) 50 MG tablet Take 50 mg by mouth 2 (two) times daily. 04/12/18   [provider]  polyethylene glycol (MIRALAX / GLYCOLAX) 17 g packet Take 17 g by mouth daily as needed for mild constipation. Patient not taking: Reported on 10/24/2018 08/22/18   Barton Dubois, MD  potassium chloride SA (K-DUR) 20 MEQ tablet Take 20 mEq by mouth 2 (two) times daily.    [provider]  PROAIR HFA 108 (90 BASE) MCG/ACT inhaler Inhale 2 puffs into the lungs every 6 (six) hours as needed for wheezing or shortness of breath.  03/06/11   [provider]  QUEtiapine (SEROQUEL) 25 MG tablet Take 1 tablet (25 mg total) by mouth at bedtime. 10/28/18 11/27/18  Manuella Ghazi, Pratik D, DO  rivaroxaban (XARELTO) 20 MG TABS tablet Take 1 tablet (20 mg total) by mouth daily with supper. Stop Xarelto until Monday (08/26/18) in anticipation for kyphoplasty 08/22/18   Barton Dubois, MD  TRELEGY ELLIPTA 100-62.5-25 MCG/INH AEPB Take 1 puff by mouth daily. 08/27/17   [provider]    Family History Family History  Problem Relation Age of Onset   Cancer Mother        lung   Diabetes Brother     Social History Social History    Tobacco Use   Smoking status: Former Smoker    Packs/day: 0.50    Years: 32.00    Pack years: 16.00    Types: Cigarettes    Start date: 12/12/1962    Quit date: 03/13/1994    Years since quitting: 24.6   Smokeless tobacco: Never Used  Substance Use Topics   Alcohol use: No    Alcohol/week: 0.0 standard drinks   Drug use: No     Allergies   Codeine   Review of Systems Review of Systems  Unable to perform ROS: Acuity of condition     Physical Exam Updated Vital Signs  BP 107/72    Pulse 75    Temp 98.5 F (36.9 C) (Oral)    Resp 17 Comment: Simultaneous filing. User may not have seen previous data.   Ht 5\' 2"  (1.575 m)    SpO2 90%    BMI 30.00 kg/m   Physical Exam Vitals signs and nursing note reviewed.  Constitutional:      Comments: Alert and oriented x3  HENT:     Head: Normocephalic and atraumatic.  Eyes:     Conjunctiva/sclera: Conjunctivae normal.  Neck:     Musculoskeletal: Neck supple.  Cardiovascular:     Rate and Rhythm: Normal rate and regular rhythm.  Pulmonary:     Effort: Pulmonary effort is normal.     Breath sounds: Normal breath sounds.  Abdominal:     General: Bowel sounds are normal.     Palpations: Abdomen is soft.  Musculoskeletal: Normal range of motion.  Skin:    General: Skin is warm and dry.  Neurological:     Mental Status: She is oriented to person, place, and time.     Comments: Able to move all 4 extremities, but confused as to specific instructions for movement.  Psychiatric:     Comments: Flat affect      ED Treatments / Results  Labs (all labs ordered are listed, but only abnormal results are displayed) Labs Reviewed  CBC WITH DIFFERENTIAL/PLATELET - Abnormal; Notable for the following components:      Result Value   WBC 14.9 (*)    RBC 3.84 (*)    Hemoglobin 10.7 (*)    MCV 101.6 (*)    MCHC 27.4 (*)    Neutro Abs 12.3 (*)    Monocytes Absolute 1.1 (*)    Abs Immature Granulocytes 0.08 (*)    All other  components within normal limits  COMPREHENSIVE METABOLIC PANEL - Abnormal; Notable for the following components:   Chloride 91 (*)    CO2 37 (*)    Glucose, Bld 180 (*)    All other components within normal limits  URINALYSIS, ROUTINE W REFLEX MICROSCOPIC - Abnormal; Notable for the following components:   Protein, ur 30 (*)    Nitrite POSITIVE (*)    Leukocytes,Ua TRACE (*)    Bacteria, UA MANY (*)    All other components within normal limits  CBG MONITORING, ED - Abnormal; Notable for the following components:   Glucose-Capillary 174 (*)    All other components within normal limits  SARS CORONAVIRUS 2 (HOSPITAL ORDER, [place] LAB)    EKG EKG Interpretation  Date/Time:  Wednesday November 06 2018 16:24:33 EDT Ventricular Rate:  80 PR Interval:    QRS Duration: 90 QT Interval:  371 QTC Calculation: 428 R Axis:   37 Text Interpretation:  Sinus rhythm Atrial premature complexes Probable left atrial enlargement Confirmed by Nat Christen [phone]) on 11/06/2018 4:45:29 PM   Radiology Ct Head Wo Contrast  Result Date: 11/06/2018 CLINICAL DATA:  Altered level of consciousness EXAM: CT HEAD WITHOUT CONTRAST TECHNIQUE: Contiguous axial images were obtained from the base of the skull through the vertex without intravenous contrast. COMPARISON:  10/26/2018 FINDINGS: Brain: No acute intracranial abnormality. Specifically, no hemorrhage, hydrocephalus, mass lesion, acute infarction, or significant intracranial injury. Vascular: No hyperdense vessel or unexpected calcification. Skull: No acute calvarial abnormality. Sinuses/Orbits: Visualized paranasal sinuses and mastoids clear. Orbital soft tissues unremarkable. Other: None IMPRESSION: No acute intracranial abnormality. Electronically Signed   By: Rolm Baptise M.D.  On: 11/06/2018 18:45   Dg Chest Port 1 View  Result Date: 11/06/2018 CLINICAL DATA:  Shortness of breath EXAM: PORTABLE CHEST 1 VIEW COMPARISON:  October 23, 2018 FINDINGS: There is persistent airspace opacity in the left base. There is atelectatic change in the right base. Heart remains enlarged with mild pulmonary venous hypertension. No adenopathy. No bone lesions. IMPRESSION: Persistent airspace consolidation consistent with pneumonia left base. Atelectasis right base. Underlying cardiomegaly with a degree of pulmonary vascular congestion. Electronically Signed   By: Lowella Grip III M.D.   On: 11/06/2018 17:08    Procedures Procedures (including critical care time)  Medications Ordered in ED Medications  ipratropium-albuterol (DUONEB) 0.5-2.5 (3) MG/3ML nebulizer solution 3 mL (has no administration in time range)     Initial Impression / Assessment and Plan / ED Course  I have reviewed the triage vital signs and the nursing notes.  Pertinent labs & imaging results that were available during my care of the patient were reviewed by me and considered in my medical decision making (see chart for details).        Complex patient presents with dyspnea versus altered mental status.  She is not an extremis.  Chest x-ray, CT head, labs, EKG.   CT head negative.  Portable chest x-ray reveals a persistent consolidation in the left base.  Will Rx IV steroids, nebulizer treatment, IV vancomycin, IV Maxipime, admit. Final Clinical Impressions(s) / ED Diagnoses   Final diagnoses:  Altered mental status, unspecified altered mental status type  HCAP (healthcare-associated pneumonia)    ED Discharge Orders    None       Nat Christen, MD 11/06/18 1626    Nat Christen, MD 11/06/18 2015

## 2018-11-06 NOTE — ED Notes (Signed)
ED TO INPATIENT HANDOFF REPORT  ED Nurse Name and Phone #: Antony Blackbird RN [phone]  S Name/Age/Gender Eileen [name] 73 y.o. female Room/Bed: APA04/APA04  Code Status   Code Status: Prior  Home/SNF/Other Home Patient oriented to: self Is this baseline? Yes   Triage Complete: Triage complete  Chief Complaint Shortness of Breath  Triage Note Pt brought in by [facility] with c/o SOB x last couple of days. Daughter reports to EMS that she isn't taking her Lasix correctly. No fever. Pt did report to EMS that she hasn't wanted to eat and is having trouble tasting her food. EMS reports that pt was initially alert and oriented but then about 1540 pt stopped talking and became lethargic. Pt goes from talking with staff to a blank stare and doesn't respond upon arrival.   EDP at bedside.   Allergies Allergies  Allergen Reactions  . Codeine Other (See Comments)    "jittery"    Level of Care/Admitting Diagnosis ED Disposition    ED Disposition Condition [place]: [facility] [id number]  Level of Care: Telemetry [5]  Covid Evaluation: Confirmed COVID Negative  Diagnosis: HCAP (healthcare-associated pneumonia) [id number]  Admitting Physician: Rolla Plate [id number]  Attending Physician: Rolla Plate [id number]  Estimated length of stay: past midnight tomorrow  Certification:: I certify this patient will need inpatient services for at least 2 midnights  PT Class (Do Not Modify): Inpatient [101]  PT Acc Code (Do Not Modify): Private [1]       B Medical/Surgery History Past Medical History:  Diagnosis Date  . Asthma   . Atrial flutter ([place])   . Cancer [facility])    Right breast  . COPD (chronic obstructive pulmonary disease) ([place])   . History of breast cancer    right breast  . Hypertension   . On home O2   . Type 2 diabetes mellitus ([place])    Past Surgical History:  Procedure Laterality Date  . BREAST SURGERY  2011   Right breast  mastectomy  . CARDIOVERSION N/A 06/27/2018   Procedure: CARDIOVERSION;  Surgeon: Arnoldo Lenis, MD;  [facility];  Service: Endoscopy;  Laterality: N/A;  . CESAREAN SECTION    . COLONOSCOPY N/A 01/22/2018   Procedure: COLONOSCOPY;  Surgeon: Rogene Houston, MD;  Location: [facility];  Service: Endoscopy;  Laterality: N/A;  . HERNIA REPAIR     RIH  . IR VERTEBROPLASTY LUMBAR BX INC UNI/BIL INC/INJECT/IMAGING  09/05/2018  . MASTECTOMY  2011   right breast  . TEE WITHOUT CARDIOVERSION N/A 06/27/2018   Procedure: TRANSESOPHAGEAL ECHOCARDIOGRAM (TEE) WITH PROPOFOL;  Surgeon: Arnoldo Lenis, MD;  Location[facility];  Service: Endoscopy;  Laterality: N/A;     A IV Location/Drains/Wounds Patient Lines/Drains/Airways Status   Active Line/Drains/Airways    Name:   Placement date:   Placement time:   Site:   Days:   Peripheral IV 11/06/18 Left Wrist   11/06/18    1900    Wrist   less than 1   External Urinary Catheter   11/06/18    1900    -   less than 1          Intake/Output Last 24 hours No intake or output data in the 24 hours ending 11/06/18 2152  Labs/Imaging Results for orders placed or performed during the hospital encounter of 11/06/18 (from the past 48 hour(s))  CBG monitoring, ED     Status:  Abnormal   Collection Time: 11/06/18  4:04 PM  Result Value Ref Range   Glucose-Capillary 174 (H) 70 - 99 mg/dL  CBC with Differential     Status: Abnormal   Collection Time: 11/06/18  4:23 PM  Result Value Ref Range   WBC 14.9 (H) 4.0 - 10.5 K/uL   RBC 3.84 (L) 3.87 - 5.11 MIL/uL   Hemoglobin 10.7 (L) 12.0 - 15.0 g/dL   HCT 39.0 36.0 - 46.0 %   MCV 101.6 (H) 80.0 - 100.0 fL   MCH 27.9 26.0 - 34.0 pg   MCHC 27.4 (L) 30.0 - 36.0 g/dL   RDW 14.6 11.5 - 15.5 %   Platelets 307 150 - 400 K/uL   nRBC 0.0 0.0 - 0.2 %   Neutrophils Relative % 81 %   Neutro Abs 12.3 (H) 1.7 - 7.7 K/uL   Lymphocytes Relative 9 %   Lymphs Abs 1.3 0.7 - 4.0 K/uL   Monocytes  Relative 8 %   Monocytes Absolute 1.1 (H) 0.1 - 1.0 K/uL   Eosinophils Relative 1 %   Eosinophils Absolute 0.1 0.0 - 0.5 K/uL   Basophils Relative 0 %   Basophils Absolute 0.0 0.0 - 0.1 K/uL   Immature Granulocytes 1 %   Abs Immature Granulocytes 0.08 (H) 0.00 - 0.07 K/uL    Comment: Performed at [facility], [address] [place], [place] [address]  Comprehensive metabolic panel     Status: Abnormal   Collection Time: 11/06/18  4:23 PM  Result Value Ref Range   Sodium 138 135 - 145 mmol/L   Potassium 5.0 3.5 - 5.1 mmol/L   Chloride 91 (L) 98 - 111 mmol/L   CO2 37 (H) 22 - 32 mmol/L   Glucose, Bld 180 (H) 70 - 99 mg/dL   BUN 14 8 - 23 mg/dL   Creatinine, Ser 0.80 0.44 - 1.00 mg/dL   Calcium 9.3 8.9 - 10.3 mg/dL   Total Protein 6.8 6.5 - 8.1 g/dL   Albumin 3.8 3.5 - 5.0 g/dL   AST 24 15 - 41 U/L   ALT 26 0 - 44 U/L   Alkaline Phosphatase 89 38 - 126 U/L   Total Bilirubin 0.6 0.3 - 1.2 mg/dL   GFR calc non Af Amer >60 >60 mL/min   GFR calc Af Amer >60 >60 mL/min   Anion gap 10 5 - 15    Comment: Performed at [facility], [address], [place], [place] [address]  Urinalysis, Routine w reflex microscopic     Status: Abnormal   Collection Time: 11/06/18  4:38 PM  Result Value Ref Range   Color, Urine YELLOW YELLOW   APPearance CLEAR CLEAR   Specific Gravity, Urine 1.009 1.005 - 1.030   pH 5.0 5.0 - 8.0   Glucose, UA NEGATIVE NEGATIVE mg/dL   Hgb urine dipstick NEGATIVE NEGATIVE   Bilirubin Urine NEGATIVE NEGATIVE   Ketones, ur NEGATIVE NEGATIVE mg/dL   Protein, ur 30 (A) NEGATIVE mg/dL   Nitrite POSITIVE (A) NEGATIVE   Leukocytes,Ua TRACE (A) NEGATIVE   RBC / HPF 0-5 0 - 5 RBC/hpf   WBC, UA 0-5 0 - 5 WBC/hpf   Bacteria, UA MANY (A) NONE SEEN   Squamous Epithelial / LPF 0-5 0 - 5   Hyaline Casts, UA PRESENT     Comment: Performed at [facility], [address], [place], [place] [address]  SARS Coronavirus 2 [facility] order, Performed in [facility] hospital lab)  Nasopharyngeal Urine,  Random     Status: None   Collection Time: 11/06/18  4:38 PM   Specimen: Urine, Random; Nasopharyngeal  Result Value Ref Range   SARS Coronavirus 2 NEGATIVE NEGATIVE    Comment: (NOTE) If result is NEGATIVE SARS-CoV-2 target nucleic acids are NOT DETECTED. The SARS-CoV-2 RNA is generally detectable in upper and lower  respiratory specimens during the acute phase of infection. The lowest  concentration of SARS-CoV-2 viral copies this assay can detect is 250  copies / mL. A negative result does not preclude SARS-CoV-2 infection  and should not be used as the sole basis for treatment or other  patient management decisions.  A negative result may occur with  improper specimen collection / handling, submission of specimen other  than nasopharyngeal swab, presence of viral mutation(s) within the  areas targeted by this assay, and inadequate number of viral copies  (<250 copies / mL). A negative result must be combined with clinical  observations, patient history, and epidemiological information. If result is POSITIVE SARS-CoV-2 target nucleic acids are DETECTED. The SARS-CoV-2 RNA is generally detectable in upper and lower  respiratory specimens dur ing the acute phase of infection.  Positive  results are indicative of active infection with SARS-CoV-2.  Clinical  correlation with patient history and other diagnostic information is  necessary to determine patient infection status.  Positive results do  not rule out bacterial infection or co-infection with other viruses. If result is PRESUMPTIVE POSTIVE SARS-CoV-2 nucleic acids MAY BE PRESENT.   A presumptive positive result was obtained on the submitted specimen  and confirmed on repeat testing.  While 2019 novel coronavirus  (SARS-CoV-2) nucleic acids may be present in the submitted sample  additional confirmatory testing may be necessary for epidemiological  and / or clinical management purposes  to differentiate  between  SARS-CoV-2 and other Sarbecovirus currently known to infect humans.  If clinically indicated additional testing with an alternate test  methodology [phone]) is advised. The SARS-CoV-2 RNA is generally  detectable in upper and lower respiratory sp ecimens during the acute  phase of infection. The expected result is Negative. Fact Sheet for Patients:  StrictlyIdeas.no Fact Sheet for Healthcare Providers: BankingDealers.co.za This test is not yet approved or cleared by the [place] FDA and has been authorized for detection and/or diagnosis of SARS-CoV-2 by FDA under an Emergency Use Authorization (EUA).  This EUA will remain in effect (meaning this test can be used) for the duration of the COVID-19 declaration under Section 564(b)(1) of the Act, 21 U.S.C. section 360bbb-3(b)(1), unless the authorization is terminated or revoked sooner. Performed at [facility], [address], [place],  [address]    Ct Head Wo Contrast  Result Date: 11/06/2018 CLINICAL DATA:  Altered level of consciousness EXAM: CT HEAD WITHOUT CONTRAST TECHNIQUE: Contiguous axial images were obtained from the base of the skull through the vertex without intravenous contrast. COMPARISON:  10/26/2018 FINDINGS: Brain: No acute intracranial abnormality. Specifically, no hemorrhage, hydrocephalus, mass lesion, acute infarction, or significant intracranial injury. Vascular: No hyperdense vessel or unexpected calcification. Skull: No acute calvarial abnormality. Sinuses/Orbits: Visualized paranasal sinuses and mastoids clear. Orbital soft tissues unremarkable. Other: None IMPRESSION: No acute intracranial abnormality. Electronically Signed   By: Rolm Baptise M.D.   On: 11/06/2018 18:45   Dg Chest Port 1 View  Result Date: 11/06/2018 CLINICAL DATA:  Shortness of breath EXAM: PORTABLE CHEST 1 VIEW COMPARISON:  October 23, 2018 FINDINGS: There is persistent airspace  opacity in the left base. There  is atelectatic change in the right base. Heart remains enlarged with mild pulmonary venous hypertension. No adenopathy. No bone lesions. IMPRESSION: Persistent airspace consolidation consistent with pneumonia left base. Atelectasis right base. Underlying cardiomegaly with a degree of pulmonary vascular congestion. Electronically Signed   By: Lowella Grip III M.D.   On: 11/06/2018 17:08    Pending Labs Unresulted Labs (From admission, onward)    Start     Ordered   11/06/18 2040  MRSA PCR Screening  Once,   STAT     11/06/18 2039   Signed and Held  HIV antibody (Routine Screening)  Once,   R     Signed and Held   Signed and Held  CBC  (enoxaparin (LOVENOX)    CrCl >/= 30 ml/min)  Once,   R    Comments: Baseline for enoxaparin therapy IF NOT ALREADY DRAWN.  Notify MD if PLT < 100 K.    Signed and Held   Signed and Held  Creatinine, serum  (enoxaparin (LOVENOX)    CrCl >/= 30 ml/min)  Once,   R    Comments: Baseline for enoxaparin therapy IF NOT ALREADY DRAWN.    Signed and Held   Signed and Held  Creatinine, serum  (enoxaparin (LOVENOX)    CrCl >/= 30 ml/min)  Weekly,   R    Comments: while on enoxaparin therapy    Signed and Held   Signed and Held  Magnesium  Add-on,   R     Signed and Held   Signed and Held  Phosphorus  Add-on,   R     Signed and Held   Signed and Held  Culture, sputum-assessment  Once,   R     Signed and Held   Signed and Held  Strep pneumoniae urinary antigen  Once,   R     Signed and Held   Signed and Held  Comprehensive metabolic panel  Tomorrow morning,   R     Signed and Held   Visual merchandiser and Held  CBC  Tomorrow morning,   R     Signed and Held   Signed and Held  Sputum culture  (Non-severe pneumonia (non-ICU care) in adult without resistant organism risk factors.)  Once,   R     Signed and Held          Vitals/Pain Today's Vitals   11/06/18 2000 11/06/18 2028 11/06/18 2030 11/06/18 2100  BP: 127/74  (!) 115/59 117/85   Pulse: 86  83 85  Resp: 19  18 (!) 24  Temp:      TempSrc:      SpO2: 94% 96% 95% 94%  Height:      PainSc:        Isolation Precautions No active isolations  Medications Medications  ipratropium-albuterol (DUONEB) 0.5-2.5 (3) MG/3ML nebulizer solution 3 mL (3 mLs Nebulization Given 11/06/18 2028)  methylPREDNISolone sodium succinate (SOLU-MEDROL) 125 mg/2 mL injection 80 mg (80 mg Intravenous Given 11/06/18 2027)    Mobility walks with device High fall risk   Focused Assessments    R Recommendations: See Admitting Provider Note  Report given to:   Additional Notes:

## 2018-11-06 NOTE — ED Notes (Addendum)
Pt states, "I just can't smell or taste anything. I don't know why I feel so bad". Pt reports these symptoms started about a week ago while in the hospital.

## 2018-11-06 NOTE — ED Triage Notes (Signed)
Pt brought in by [facility] with c/o SOB x last couple of days. Daughter reports to EMS that she isn't taking her Lasix correctly. No fever. Pt did report to EMS that she hasn't wanted to eat and is having trouble tasting her food. EMS reports that pt was initially alert and oriented but then about 1540 pt stopped talking and became lethargic. Pt goes from talking with staff to a blank stare and doesn't respond upon arrival.

## 2018-11-06 NOTE — H&P (Signed)
TRH H&P    Patient Demographics:    Eileen [name], is a 73 y.o. female  MRN: [id number]  DOB - 1946/01/07  Admit Date - 11/06/2018  Referring MD/NP/PA: Dr. Lacinda Axon  Outpatient Primary MD for the patient is Lavella Lemons, PA  Patient coming from: Home via EMS  Chief complaint-"I do not feel good"   HPI:    Eileen [name]  is a 73 y.o. female, with history of right breast cancer in remission, L1 compression fracture status post kyphoplasty, iron deficiency anemia, diverticulosis, diabetes mellitus type 2, atrial flutter, moderate mitral regurgitation (E echo 06-27-2026), COPD, chronic respiratory failure on 3.5 L at baseline presents to the ER today because she does not feel well.  Patient has dementia and is not able to offer reliable history.  She is alert and oriented x2-to self and place, but she is not able to accurately identify her daughter at bedside.  History is taken from chart review, daughter at bedside, and primary caregiver [name] (another daughter) whose phone number is [phone].  Patiently recently admitted on October 24, 2018 for COPD exacerbation.  At that time she was started on vancomycin, cefepime, and had MRSA screening, urine Legionella antigen, and urine strep antigen, as well as 2 sets of blood cultures.  At that time blood cultures had no growth at 5 days.  MRSA screen by PCR has been consistently negative on October 24, 2018, October 19, 2018, September 26, 2018, Aug 10, 2018, etc.  Urinary antigen for strep pneumo has been negative consistently as well over the last 3 years.  Urine Legionella was negative at that admission as well.  Unfortunately I am unable to locate the sputum culture.  Today, daughter [name], reports that patient requested to call ambulance to come into the ED due to not feeling well.  Daughter does report the patient has had decreased appetite over the past 2 days.  Although she  has eaten it has not been her normal amount.  Patient has not had any vomiting diarrhea or foul-smelling urine.  Patient has not had a cough more than her normal.  She has not required an increase in her oxygen supplementation.  Daughter thinks that this complaint is secondary to dementia.  She reports that the patient gets much worse every evening.  And it was not until evening that the patient started saying that she did not feel well and wanted to go to the hospital.  Daughter reports that she tried to get patient to describe which symptoms she was experiencing.  Patient just repeated over and over that she was not feeling well.  Patient herself is not able to contribute to review of systems due to dementia.  At the moment of the exam patient reports that her breathing feels so/so.  She reports that sometimes it feels good, but most the time it feels so-so.  In the ER: Patient did have a leukocytosis of 14.9.  She had a hemoglobin of 10.7-down from 12.0 10 days ago.  However her baseline appears to be close to  11.  On a chemistry profile her glucose was 180, BUN and creatinine were within normal limits, liver function was in normal limits, GFR was greater than 60, anion gap of 10.  Patient had urine analysis that did show positive nitrites and positive leukocytes as well as many bacteria-epithelial cells are 0-5.  Patient was COVID negative.  Chest x-ray revealed a persistent airspace consolidation consistent with pneumonia left base.  Atelectasis right base.  Underlying cardiomegaly with a degree of pulmonary vascular congestion. (Daughter did report that patient does not take her Lasix daily, instead she takes it every other day, but she appears euvolemic at this time).  Patient was started on vancomycin and cefepime in the ED.  She is given a nebulizer treatment.  She is given 1 dose of Solu-Medrol.  Daughter does report the patient is not able to tolerate steroids due to muscle twitching as a side effect.    Review of systems:    In addition to the HPI above no further review of systems could be obtained due to patient's dementia.    Past History of the following :    Past Medical History:  Diagnosis Date  . Asthma   . Atrial flutter ([place])   . Cancer [facility])    Right breast  . COPD (chronic obstructive pulmonary disease) ([place])   . History of breast cancer    right breast  . Hypertension   . On home O2   . Type 2 diabetes mellitus ([place])       Past Surgical History:  Procedure Laterality Date  . BREAST SURGERY  2011   Right breast mastectomy  . CARDIOVERSION N/A 06/27/2018   Procedure: CARDIOVERSION;  Surgeon: Arnoldo Lenis, MD;  [facility];  Service: Endoscopy;  Laterality: N/A;  . CESAREAN SECTION    . COLONOSCOPY N/A 01/22/2018   Procedure: COLONOSCOPY;  Surgeon: Rogene Houston, MD;  Location: [facility];  Service: Endoscopy;  Laterality: N/A;  . HERNIA REPAIR     RIH  . IR VERTEBROPLASTY LUMBAR BX INC UNI/BIL INC/INJECT/IMAGING  09/05/2018  . MASTECTOMY  2011   right breast  . TEE WITHOUT CARDIOVERSION N/A 06/27/2018   Procedure: TRANSESOPHAGEAL ECHOCARDIOGRAM (TEE) WITH PROPOFOL;  Surgeon: Arnoldo Lenis, MD;  Location[facility];  Service: Endoscopy;  Laterality: N/A;      Social History:      Social History   Tobacco Use  . Smoking status: Former Smoker    Packs/day: 0.50    Years: 32.00    Pack years: 16.00    Types: Cigarettes    Start date: 12/12/1962    Quit date: 03/13/1994    Years since quitting: 24.6  . Smokeless tobacco: Never Used  Substance Use Topics  . Alcohol use: No    Alcohol/week: 0.0 standard drinks       Family History :     Family History  Problem Relation Age of Onset  . Cancer Mother        lung  . Diabetes Brother       Home Medications:   Prior to Admission medications   Medication Sig Start Date End Date Taking? Authorizing Provider  acetaminophen (TYLENOL) 500 MG tablet Take 500 mg by  mouth every 8 (eight) hours as needed for mild pain or headache.     Lavella Lemons, PA  albuterol (PROVENTIL) (2.5 MG/3ML) 0.083% nebulizer solution Take 2.5 mg by nebulization every 6 (six) hours as needed for wheezing  or shortness of breath.    [provider]  amiodarone (PACERONE) 200 MG tablet Take 1 tablet (200 mg total) by mouth daily. 07/20/18   Strader, Fransisco Hertz, PA-C  docusate sodium (COLACE) 100 MG capsule Take 1 capsule (100 mg total) by mouth 2 (two) times daily. Patient taking differently: Take 100 mg by mouth daily.  08/22/18   Barton Dubois, MD  furosemide (LASIX) 40 MG tablet Take 1.5 tablets (60 mg total) by mouth daily. Patient taking differently: Take 60 mg by mouth every other day.  10/21/18   Barton Dubois, MD  guaiFENesin (MUCINEX) 600 MG 12 hr tablet Take 1 tablet (600 mg total) by mouth 2 (two) times daily. 08/22/18   Barton Dubois, MD  metFORMIN (GLUCOPHAGE-XR) 500 MG 24 hr tablet Take 500 mg by mouth daily with breakfast.  08/10/17   [provider]  metoprolol tartrate (LOPRESSOR) 50 MG tablet Take 50 mg by mouth 2 (two) times daily. 04/12/18   [provider]  polyethylene glycol (MIRALAX / GLYCOLAX) 17 g packet Take 17 g by mouth daily as needed for mild constipation. Patient not taking: Reported on 10/24/2018 08/22/18   Barton Dubois, MD  potassium chloride SA (K-DUR) 20 MEQ tablet Take 20 mEq by mouth 2 (two) times daily.    [provider]  PROAIR HFA 108 (90 BASE) MCG/ACT inhaler Inhale 2 puffs into the lungs every 6 (six) hours as needed for wheezing or shortness of breath.  03/06/11   [provider]  rivaroxaban (XARELTO) 20 MG TABS tablet Take 1 tablet (20 mg total) by mouth daily with supper. Stop Xarelto until Monday (08/26/18) in anticipation for kyphoplasty 08/22/18   Barton Dubois, MD  TRELEGY ELLIPTA 100-62.5-25 MCG/INH AEPB Take 1 puff by mouth daily. 08/27/17   [provider]     Allergies:      Allergies  Allergen Reactions  . Codeine Other (See Comments)    "jittery"     Physical Exam:   Vitals  Blood pressure 116/75, pulse 85, temperature 98.5 F (36.9 C), temperature source Oral, resp. rate (!) 23, height 5\' 2"  (1.575 m), SpO2 97 %.  1.  General: Patient lying supine in bed with head of bed at 30 degrees in no acute distress.  2. Psychiatric: Patient alert and oriented x2.  Not able to identify daughter accurately at bedside.  Pleasant.  3. Neurologic: No asymmetrical weakness, cranial nerves II through XII are grossly intact, no focal deficits on limited exam.  4. HEENMT:  Head is atraumatic normocephalic.  Pupils equal round and reactive to light.  Neck reveals thyromegaly but no nodules palpated.  5. Respiratory : Lungs are clear to auscultation bilaterally.  Diminished lung sounds in bilateral bases.  6. Cardiovascular : Heart rate and rhythm are regular.  Slight murmur heard at left sternal border.  No rubs or gallops.  7. Gastrointestinal:  Abdomen is soft, bloated, nontender to palpation  8. Skin:  Skin in bilateral lower extremities show signs of diuresis with minimal wrinkling and some hyperpigmentation.  No rashes on limited skin exam.  No suspicious lesions on limited skin exam.  9.Musculoskeletal:  Patient is having myoclonic spasms.  This is occurring mostly in her left upper extremity at this time.    Data Review:    CBC Recent Labs  Lab 11/06/18 1623  WBC 14.9*  HGB 10.7*  HCT 39.0  PLT 307  MCV 101.6*  MCH 27.9  MCHC 27.4*  RDW 14.6  LYMPHSABS 1.3  MONOABS 1.1*  EOSABS 0.1  BASOSABS 0.0   ------------------------------------------------------------------------------------------------------------------  Results for orders placed or performed during the hospital encounter of 11/06/18 (from the past 48 hour(s))  CBG monitoring, ED     Status: Abnormal   Collection Time: 11/06/18  4:04 PM  Result Value Ref Range    Glucose-Capillary 174 (H) 70 - 99 mg/dL  CBC with Differential     Status: Abnormal   Collection Time: 11/06/18  4:23 PM  Result Value Ref Range   WBC 14.9 (H) 4.0 - 10.5 K/uL   RBC 3.84 (L) 3.87 - 5.11 MIL/uL   Hemoglobin 10.7 (L) 12.0 - 15.0 g/dL   HCT 39.0 36.0 - 46.0 %   MCV 101.6 (H) 80.0 - 100.0 fL   MCH 27.9 26.0 - 34.0 pg   MCHC 27.4 (L) 30.0 - 36.0 g/dL   RDW 14.6 11.5 - 15.5 %   Platelets 307 150 - 400 K/uL   nRBC 0.0 0.0 - 0.2 %   Neutrophils Relative % 81 %   Neutro Abs 12.3 (H) 1.7 - 7.7 K/uL   Lymphocytes Relative 9 %   Lymphs Abs 1.3 0.7 - 4.0 K/uL   Monocytes Relative 8 %   Monocytes Absolute 1.1 (H) 0.1 - 1.0 K/uL   Eosinophils Relative 1 %   Eosinophils Absolute 0.1 0.0 - 0.5 K/uL   Basophils Relative 0 %   Basophils Absolute 0.0 0.0 - 0.1 K/uL   Immature Granulocytes 1 %   Abs Immature Granulocytes 0.08 (H) 0.00 - 0.07 K/uL    Comment: Performed at [facility], [address] [place], [place] [address]  Comprehensive metabolic panel     Status: Abnormal   Collection Time: 11/06/18  4:23 PM  Result Value Ref Range   Sodium 138 135 - 145 mmol/L   Potassium 5.0 3.5 - 5.1 mmol/L   Chloride 91 (L) 98 - 111 mmol/L   CO2 37 (H) 22 - 32 mmol/L   Glucose, Bld 180 (H) 70 - 99 mg/dL   BUN 14 8 - 23 mg/dL   Creatinine, Ser 0.80 0.44 - 1.00 mg/dL   Calcium 9.3 8.9 - 10.3 mg/dL   Total Protein 6.8 6.5 - 8.1 g/dL   Albumin 3.8 3.5 - 5.0 g/dL   AST 24 15 - 41 U/L   ALT 26 0 - 44 U/L   Alkaline Phosphatase 89 38 - 126 U/L   Total Bilirubin 0.6 0.3 - 1.2 mg/dL   GFR calc non Af Amer >60 >60 mL/min   GFR calc Af Amer >60 >60 mL/min   Anion gap 10 5 - 15    Comment: Performed at [facility], [address], [place], [place] [address]  Urinalysis, Routine w reflex microscopic     Status: Abnormal   Collection Time: 11/06/18  4:38 PM  Result Value Ref Range   Color, Urine YELLOW YELLOW   APPearance CLEAR CLEAR   Specific Gravity, Urine 1.009 1.005 - 1.030   pH 5.0  5.0 - 8.0   Glucose, UA NEGATIVE NEGATIVE mg/dL   Hgb urine dipstick NEGATIVE NEGATIVE   Bilirubin Urine NEGATIVE NEGATIVE   Ketones, ur NEGATIVE NEGATIVE mg/dL   Protein, ur 30 (A) NEGATIVE mg/dL   Nitrite POSITIVE (A) NEGATIVE   Leukocytes,Ua TRACE (A) NEGATIVE   RBC / HPF 0-5 0 - 5 RBC/hpf   WBC, UA 0-5 0 - 5 WBC/hpf   Bacteria, UA MANY (A) NONE SEEN   Squamous Epithelial / LPF 0-5 0 -  5   Hyaline Casts, UA PRESENT     Comment: Performed at [facility], [address], [place], [place] [address]  SARS Coronavirus 2 [facility] order, Performed in [facility] hospital lab) Nasopharyngeal Urine, Random     Status: None   Collection Time: 11/06/18  4:38 PM   Specimen: Urine, Random; Nasopharyngeal  Result Value Ref Range   SARS Coronavirus 2 NEGATIVE NEGATIVE    Comment: (NOTE) If result is NEGATIVE SARS-CoV-2 target nucleic acids are NOT DETECTED. The SARS-CoV-2 RNA is generally detectable in upper and lower  respiratory specimens during the acute phase of infection. The lowest  concentration of SARS-CoV-2 viral copies this assay can detect is 250  copies / mL. A negative result does not preclude SARS-CoV-2 infection  and should not be used as the sole basis for treatment or other  patient management decisions.  A negative result may occur with  improper specimen collection / handling, submission of specimen other  than nasopharyngeal swab, presence of viral mutation(s) within the  areas targeted by this assay, and inadequate number of viral copies  (<250 copies / mL). A negative result must be combined with clinical  observations, patient history, and epidemiological information. If result is POSITIVE SARS-CoV-2 target nucleic acids are DETECTED. The SARS-CoV-2 RNA is generally detectable in upper and lower  respiratory specimens dur ing the acute phase of infection.  Positive  results are indicative of active infection with SARS-CoV-2.  Clinical  correlation with patient  history and other diagnostic information is  necessary to determine patient infection status.  Positive results do  not rule out bacterial infection or co-infection with other viruses. If result is PRESUMPTIVE POSTIVE SARS-CoV-2 nucleic acids MAY BE PRESENT.   A presumptive positive result was obtained on the submitted specimen  and confirmed on repeat testing.  While 2019 novel coronavirus  (SARS-CoV-2) nucleic acids may be present in the submitted sample  additional confirmatory testing may be necessary for epidemiological  and / or clinical management purposes  to differentiate between  SARS-CoV-2 and other Sarbecovirus currently known to infect humans.  If clinically indicated additional testing with an alternate test  methodology [phone]) is advised. The SARS-CoV-2 RNA is generally  detectable in upper and lower respiratory sp ecimens during the acute  phase of infection. The expected result is Negative. Fact Sheet for Patients:  StrictlyIdeas.no Fact Sheet for Healthcare Providers: BankingDealers.co.za This test is not yet approved or cleared by the [place] FDA and has been authorized for detection and/or diagnosis of SARS-CoV-2 by FDA under an Emergency Use Authorization (EUA).  This EUA will remain in effect (meaning this test can be used) for the duration of the COVID-19 declaration under Section 564(b)(1) of the Act, 21 U.S.C. section 360bbb-3(b)(1), unless the authorization is terminated or revoked sooner. Performed at [facility], [address], [place], [place] [address]     Chemistries  Recent Labs  Lab 11/06/18 1623  NA 138  K 5.0  CL 91*  CO2 37*  GLUCOSE 180*  BUN 14  CREATININE 0.80  CALCIUM 9.3  AST 24  ALT 26  ALKPHOS 89  BILITOT 0.6   ------------------------------------------------------------------------------------------------------------------   ------------------------------------------------------------------------------------------------------------------ GFR: Estimated Creatinine Clearance: 59.1 mL/min (by C-G formula based on SCr of 0.8 mg/dL). Liver Function Tests: Recent Labs  Lab 11/06/18 1623  AST 24  ALT 26  ALKPHOS 89  BILITOT 0.6  PROT 6.8  ALBUMIN 3.8   No results for input(s): LIPASE, AMYLASE in the last 168  hours. No results for input(s): AMMONIA in the last 168 hours. Coagulation Profile: No results for input(s): INR, PROTIME in the last 168 hours. Cardiac Enzymes: No results for input(s): CKTOTAL, CKMB, CKMBINDEX, TROPONINI in the last 168 hours. BNP (last 3 results) No results for input(s): PROBNP in the last 8760 hours. HbA1C: No results for input(s): HGBA1C in the last 72 hours. CBG: Recent Labs  Lab 11/06/18 1604  GLUCAP 174*   Lipid Profile: No results for input(s): CHOL, HDL, LDLCALC, TRIG, CHOLHDL, LDLDIRECT in the last 72 hours. Thyroid Function Tests: No results for input(s): TSH, T4TOTAL, FREET4, T3FREE, THYROIDAB in the last 72 hours. Anemia Panel: No results for input(s): VITAMINB12, FOLATE, FERRITIN, TIBC, IRON, RETICCTPCT in the last 72 hours.  --------------------------------------------------------------------------------------------------------------- Urine analysis:    Component Value Date/Time   COLORURINE YELLOW 11/06/2018 1638   APPEARANCEUR CLEAR 11/06/2018 1638   LABSPEC 1.009 11/06/2018 1638   PHURINE 5.0 11/06/2018 1638   GLUCOSEU NEGATIVE 11/06/2018 1638   HGBUR NEGATIVE 11/06/2018 [place] 11/06/2018 [place] 11/06/2018 1638   PROTEINUR 30 (A) 11/06/2018 1638   UROBILINOGEN 0.2 09/09/2009 1010   NITRITE POSITIVE (A) 11/06/2018 1638   LEUKOCYTESUR TRACE (A) 11/06/2018 1638      Imaging Results:    Ct Head Wo Contrast  Result Date: 11/06/2018 CLINICAL DATA:  Altered level of consciousness EXAM: CT HEAD WITHOUT CONTRAST  TECHNIQUE: Contiguous axial images were obtained from the base of the skull through the vertex without intravenous contrast. COMPARISON:  10/26/2018 FINDINGS: Brain: No acute intracranial abnormality. Specifically, no hemorrhage, hydrocephalus, mass lesion, acute infarction, or significant intracranial injury. Vascular: No hyperdense vessel or unexpected calcification. Skull: No acute calvarial abnormality. Sinuses/Orbits: Visualized paranasal sinuses and mastoids clear. Orbital soft tissues unremarkable. Other: None IMPRESSION: No acute intracranial abnormality. Electronically Signed   By: Rolm Baptise M.D.   On: 11/06/2018 18:45   Dg Chest Port 1 View  Result Date: 11/06/2018 CLINICAL DATA:  Shortness of breath EXAM: PORTABLE CHEST 1 VIEW COMPARISON:  October 23, 2018 FINDINGS: There is persistent airspace opacity in the left base. There is atelectatic change in the right base. Heart remains enlarged with mild pulmonary venous hypertension. No adenopathy. No bone lesions. IMPRESSION: Persistent airspace consolidation consistent with pneumonia left base. Atelectasis right base. Underlying cardiomegaly with a degree of pulmonary vascular congestion. Electronically Signed   By: Lowella Grip III M.D.   On: 11/06/2018 17:08      Assessment & Plan:    Active Problems:   HCAP (healthcare-associated pneumonia)   1. H CAP present on admission 1. Patient started on vancomycin and cefepime in the ER. 2. Patient has consistently had negative MRSA screening.  We will de-escalate antibiotics upon admission.  Vancomycin can be discontinued.  New antibiotic regimen will be Rocephin and azithromycin.  Patient is a pseudomonal risk due to multiple hospitalizations within the last 90 days. 3. Patient had a leukocytosis, heart rate was noted with normal limits at 85, blood pressure was within normal limits of 116/75, respiratory rate at the time of my exam was 16.  There is not concern for sepsis in this patient  at this time. 4. Chest x-ray showed a persistent left base pneumonia.  Patient recently discharged on August 17-and had left base pneumonia at that time as well. 5. We will continue to monitor vitals, pulse ox, continue her supplemental oxygen at 3.5 L nasal cannula (baseline). 6. Currently holding off on Legionella antigen.  Sputum culture  and strep urine antigen have been ordered. 7. Continue patient's Proventil, Trelegy, but holding systemic steroids at this time. 2. Thyromegaly 1. Patient recently had TSH drawn on October 26, 2018.  Her TSH has been consistently normal.  We will not repeat this lab at this admission. 3. Hyperglycemia in the setting of diabetes mellitus type 2 1. Patient takes metformin at home.  We are holding this in the hospital.  Will have patient on sliding scale correction.  Patient's last hemoglobin A1c was drawn on September 29, 2018.  It was 8.5.  We will not be redrawing a hemoglobin A1c on this admission. 4. History of dementia with sundowning 1. Daughter reports that patient has outpatient appointment to address this.  Patient has stopped her Seroquel at home.  She told her primary care physician that it was causing her to be more altered.  Daughter does not agree with this assessment but patient was ordered to discontinue it based on this report.  We will work to reorient patient especially in the evenings.  If she should need medication in the evening we will can consider starting Ativan. 5. History of atrial flutter 1. Patient is on Xarelto and amiodarone.  EKG shows sinus rhythm with some PACs.  Patient has been admitted to telemetry floor -for continued monitoring.  DVT Prophylaxis-Xarelto  AM Labs Ordered, also please review Full Orders  Family Communication: Admission, patients condition and plan of care including tests being ordered have been discussed with the patient and daughter [name] who indicate understanding and agree with the plan and Code Status.  Code  Status: Full  Admission status:Inpatient: Based on patients clinical presentation and evaluation of above clinical data, I have made determination that patient meets Inpatient criteria at this time.  Time spent in minutes : 65   [place] B Zierle-Ghosh M.D on 11/06/2018 at 10:04 PM

## 2018-11-06 NOTE — Plan of Care (Signed)
  Problem: Activity: Goal: Ability to tolerate increased activity will improve Outcome: Not Progressing   Problem: Respiratory: Goal: Ability to maintain a clear airway will improve Outcome: Not Progressing   Problem: Education: Goal: Knowledge of disease or condition will improve Outcome: Not Progressing

## 2018-11-06 NOTE — ED Triage Notes (Signed)
EDP at bedside  

## 2018-11-07 ENCOUNTER — Other Ambulatory Visit: Payer: Self-pay | Admitting: *Deleted

## 2018-11-07 ENCOUNTER — Inpatient Hospital Stay (HOSPITAL_COMMUNITY): Payer: Medicare Other

## 2018-11-07 ENCOUNTER — Ambulatory Visit: Payer: Self-pay | Admitting: *Deleted

## 2018-11-07 DIAGNOSIS — F0391 Unspecified dementia with behavioral disturbance: Secondary | ICD-10-CM

## 2018-11-07 DIAGNOSIS — F03918 Unspecified dementia, unspecified severity, with other behavioral disturbance: Secondary | ICD-10-CM

## 2018-11-07 DIAGNOSIS — J4489 Other specified chronic obstructive pulmonary disease: Secondary | ICD-10-CM

## 2018-11-07 DIAGNOSIS — Z7189 Other specified counseling: Secondary | ICD-10-CM

## 2018-11-07 DIAGNOSIS — Z515 Encounter for palliative care: Secondary | ICD-10-CM

## 2018-11-07 DIAGNOSIS — J449 Chronic obstructive pulmonary disease, unspecified: Secondary | ICD-10-CM

## 2018-11-07 LAB — BLOOD GAS, ARTERIAL
Acid-Base Excess: 13.1 mmol/L — ABNORMAL HIGH (ref 0.0–2.0)
Bicarbonate: 34.3 mmol/L — ABNORMAL HIGH (ref 20.0–28.0)
FIO2: 32
FIO2: 40
O2 Saturation: 87.5 %
O2 Saturation: 88.2 %
Patient temperature: 37.5
Patient temperature: 36.3
pCO2 arterial: >120 mmHg (ref 32.0–48.0)
pCO2 arterial: 95.5 mmHg (ref 32.0–48.0)
pH, Arterial: 7.042 — CL (ref 7.350–7.450)
pH, Arterial: 7.251 — ABNORMAL LOW (ref 7.350–7.450)
pO2, Arterial: 74.9 mmHg — ABNORMAL LOW (ref 83.0–108.0)
pO2, Arterial: 58.2 mmHg — ABNORMAL LOW (ref 83.0–108.0)

## 2018-11-07 LAB — CBC
HCT: 39.2 % (ref 36.0–46.0)
Hemoglobin: 10.7 g/dL — ABNORMAL LOW (ref 12.0–15.0)
MCH: 28.2 pg (ref 26.0–34.0)
MCHC: 27.3 g/dL — ABNORMAL LOW (ref 30.0–36.0)
MCV: 103.2 fL — ABNORMAL HIGH (ref 80.0–100.0)
Platelets: 288 10*3/uL (ref 150–400)
RBC: 3.8 MIL/uL — ABNORMAL LOW (ref 3.87–5.11)
RDW: 14.5 % (ref 11.5–15.5)
WBC: 11.6 10*3/uL — ABNORMAL HIGH (ref 4.0–10.5)
nRBC: 0 % (ref 0.0–0.2)

## 2018-11-07 LAB — COMPREHENSIVE METABOLIC PANEL
ALT: 70 U/L — ABNORMAL HIGH (ref 0–44)
AST: 82 U/L — ABNORMAL HIGH (ref 15–41)
Albumin: 3.6 g/dL (ref 3.5–5.0)
Alkaline Phosphatase: 103 U/L (ref 38–126)
Anion gap: 12 (ref 5–15)
BUN: 19 mg/dL (ref 8–23)
CO2: 36 mmol/L — ABNORMAL HIGH (ref 22–32)
Calcium: 9.1 mg/dL (ref 8.9–10.3)
Chloride: 94 mmol/L — ABNORMAL LOW (ref 98–111)
Creatinine, Ser: 1.05 mg/dL — ABNORMAL HIGH (ref 0.44–1.00)
GFR calc Af Amer: >60 mL/min (ref >60)
GFR calc non Af Amer: 53 mL/min — ABNORMAL LOW (ref >60)
Glucose, Bld: 218 mg/dL — ABNORMAL HIGH (ref 70–99)
Potassium: 5.9 mmol/L — ABNORMAL HIGH (ref 3.5–5.1)
Sodium: 142 mmol/L (ref 135–145)
Total Bilirubin: 0.7 mg/dL (ref 0.3–1.2)
Total Protein: 6.8 g/dL (ref 6.5–8.1)

## 2018-11-07 LAB — PROCALCITONIN: Procalcitonin: 0.74 ng/mL

## 2018-11-07 LAB — MRSA PCR SCREENING: MRSA by PCR: NEGATIVE

## 2018-11-07 LAB — GLUCOSE, CAPILLARY
Glucose-Capillary: 168 mg/dL — ABNORMAL HIGH (ref 70–99)
Glucose-Capillary: 184 mg/dL — ABNORMAL HIGH (ref 70–99)
Glucose-Capillary: 105 mg/dL — ABNORMAL HIGH (ref 70–99)
Glucose-Capillary: 96 mg/dL (ref 70–99)

## 2018-11-07 MED ORDER — LORAZEPAM 2 MG/ML IJ SOLN: 1.0000 mg | Freq: Once | INTRAMUSCULAR | Status: AC

## 2018-11-07 MED ORDER — RIVAROXABAN 15 MG PO TABS: 15.0000 mg | ORAL_TABLET | Freq: Every day | ORAL | Status: DC

## 2018-11-07 MED ORDER — OLANZAPINE 5 MG PO TBDP: 2.5000 mg | ORAL_TABLET | Freq: Every day | ORAL | Status: DC

## 2018-11-07 MED ORDER — SODIUM ZIRCONIUM CYCLOSILICATE 10 G PO PACK
10.0000 g | PACK | Freq: Once | ORAL | Status: DC
  Filled 2018-11-07: qty 1

## 2018-11-07 MED ORDER — UMECLIDINIUM BROMIDE 62.5 MCG/INH IN AEPB
1.0000 | INHALATION_SPRAY | Freq: Every day | RESPIRATORY_TRACT | Status: DC
  Administered 2018-11-09 – 2018-11-10 (×2): 1 via RESPIRATORY_TRACT
  Filled 2018-11-07 (×2): qty 7

## 2018-11-07 MED ORDER — CHLORHEXIDINE GLUCONATE CLOTH 2 % EX PADS
6.0000 | MEDICATED_PAD | Freq: Every day | CUTANEOUS | Status: DC
  Administered 2018-11-07 – 2018-11-10 (×4): 6 via TOPICAL

## 2018-11-07 MED ORDER — FUROSEMIDE 10 MG/ML IJ SOLN
40.0000 mg | Freq: Two times a day (BID) | INTRAMUSCULAR | Status: DC
  Administered 2018-11-07 – 2018-11-10 (×6): 40 mg via INTRAVENOUS
  Filled 2018-11-07 (×7): qty 4

## 2018-11-07 MED ORDER — LORAZEPAM 2 MG/ML IJ SOLN
INTRAMUSCULAR | Status: AC
  Administered 2018-11-07: 1 mg via INTRAVENOUS
  Filled 2018-11-07: qty 1

## 2018-11-07 MED ORDER — FLUTICASONE FUROATE-VILANTEROL 100-25 MCG/INH IN AEPB
1.0000 | INHALATION_SPRAY | Freq: Every day | RESPIRATORY_TRACT | Status: DC
  Administered 2018-11-09 – 2018-11-10 (×2): 1 via RESPIRATORY_TRACT
  Filled 2018-11-07 (×2): qty 28

## 2018-11-07 NOTE — Consult Note (Addendum)
Consultation Note Date: 11/07/2018   Patient Name: Eileen [name]  DOB: 07/01/1945  MRN: [id number]  Age / Sex: 73 y.o., female  PCP: Lavella Lemons, [place] Referring Physician: Rodena Goldmann, DO  Reason for Consultation: Establishing goals of care  HPI/Patient Profile: 73 y.o. female  with past medical history of dementia, breast cancer in remission, COPD with chronic resp failure on 3.5L O2 at baseline, L1 compression fracture, iron deficiency anemia, diverticulosis, DM1, atrial flutter, admitted on 11/06/2018 with complaints of generalized malaise. She is admitted and being treated for HCAP. Currently on Bipap. Palliative medicine consulted for GOC in dementia, COPD patient with frequent hospitalizations.   Clinical Assessment and Goals of Care: Chart reviewed- noted she received lorazepam before developing hypercapnia requiring bipap.  Patient awake in bed, but not oriented. Her daughter- [name] is at bedside. [name] tells me she is one of four siblings and they all make decisions together for their mother.  We discussed her mother's status and care. [name] notes that the "bipap always makes her better".  When asked about [place] for her mother [name] stated [place] was to stabilize, treat treatable and bring her mother home. Limit is set and NOT discharging her mother to a SNF- 'not ever'. [name] notes there are several family members to care for her mother and they will never send her to a facility. Patient does not have advanced directives- however, [name] notes that if patient required intubation she does not believe she and her siblings would choose that for their Mom. We discussed that given her dementia, COPD and other comorbidities- if her mother required CPR and intubation- she would require living in a hospital or SNF for the rest of her days. We discussed a DNR for her Mom in order to avoid that. [name] is in  agreement with this- however, states she needs to discuss this with her siblings before making official decision. We further discussed that given her mother's frequent admissions related to her respiratory status- if she desired not to return to the hospital she would be eligible for Hospice services at home if [name] were to remain at home and avoid rehospitalization. [name] stated that she and her mother were "tired of [name]"- however, again- this is not something she can decide on her own. I encouraged [name] to discuss these topics with her siblings and with patient's MD.   Primary Decision Maker NEXT OF KIN- majority of patient's children    SUMMARY OF RECOMMENDATIONS -Recommend avoiding lorazepam in this elderly patient with dementia  -Will start olanzapine 2.5mg  OD nightly for sundowning- recommend increasing to 5mg  if needed -Last SLP eval was 2018- will order SLP for possible aspiration may be contributing to recurrent pnuemonia -Recommend outpatient Palliative follow patient at home on discharge  -PMT will return to service on Monday August 31st and followup if needed   Discharge Planning: Home with Home Health  Primary Diagnoses: Present on Admission:  HCAP (healthcare-associated pneumonia)   I have reviewed the medical record, interviewed the patient  and family, and examined the patient. The following aspects are pertinent.  Past Medical History:  Diagnosis Date   Asthma    Atrial flutter ([place])    Cancer (HCC)    Right breast   COPD (chronic obstructive pulmonary disease) (HCC)    History of breast cancer    right breast   Hypertension    On home O2    Type 2 diabetes mellitus (HCC)    Social History   Socioeconomic History   Marital status: Single    Spouse name: Not on file   Number of children: Not on file   Years of education: Not on file   Highest education level: Not on file  Occupational History   Not on file  Social Needs    Financial resource strain: Not hard at all   Food insecurity    Worry: Never true    Inability: Never true   Transportation needs    Medical: Patient refused    Non-medical: Patient refused  Tobacco Use   Smoking status: Former Smoker    Packs/day: 0.50    Years: 32.00    Pack years: 16.00    Types: Cigarettes    Start date: 12/12/1962    Quit date: 03/13/1994    Years since quitting: 24.6   Smokeless tobacco: Never Used  Substance and Sexual Activity   Alcohol use: No    Alcohol/week: 0.0 standard drinks   Drug use: No   Sexual activity: Not on file  Lifestyle   Physical activity    Days per week: Patient refused    Minutes per session: Patient refused   Stress: Not at all  Relationships   Social connections    Talks on phone: Three times a week    Gets together: Three times a week    Attends religious service: More than 4 times per year    Active member of club or organization: Yes    Attends meetings of clubs or organizations: More than 4 times per year    Relationship status: Never married  Other Topics Concern   Not on file  Social History Narrative   Not on file   Family History  Problem Relation Age of Onset   Cancer Mother        lung   Diabetes Brother    Scheduled Meds:  amiodarone  200 mg Oral Daily   Chlorhexidine Gluconate Cloth  6 each Topical Daily   docusate sodium  100 mg Oral Daily   fluticasone furoate-vilanterol  1 puff Inhalation Daily   And   umeclidinium bromide  1 puff Inhalation Daily   furosemide  40 mg Intravenous Q12H   insulin aspart  0-9 Units Subcutaneous TID WC   metoprolol tartrate  50 mg Oral BID   rivaroxaban  15 mg Oral Q supper   sodium zirconium cyclosilicate  10 g Oral Once   Continuous Infusions:  azithromycin 500 mg (11/07/18 0145)   cefTRIAXone (ROCEPHIN)  IV 2 g (11/07/18 0047)   PRN Meds:.albuterol, albuterol, polyethylene glycol Medications Prior to Admission:  Prior to Admission  medications   Medication Sig Start Date End Date Taking? Authorizing Provider  acetaminophen (TYLENOL) 500 MG tablet Take 500 mg by mouth every 8 (eight) hours as needed for mild pain or headache.    Yes Lavella Lemons, PA  albuterol (PROVENTIL) (2.5 MG/3ML) 0.083% nebulizer solution Take 2.5 mg by nebulization every 6 (six) hours as needed for wheezing or shortness of breath.  Yes [provider]  amiodarone (PACERONE) 200 MG tablet Take 1 tablet (200 mg total) by mouth daily. 07/20/18  Yes Strader, [place] M, PA-C  docusate sodium (COLACE) 100 MG capsule Take 1 capsule (100 mg total) by mouth 2 (two) times daily. Patient taking differently: Take 100 mg by mouth daily.  08/22/18  Yes Barton Dubois, MD  furosemide (LASIX) 40 MG tablet Take 1.5 tablets (60 mg total) by mouth daily. Patient taking differently: Take 60 mg by mouth every other day.  10/21/18  Yes Barton Dubois, MD  guaiFENesin (MUCINEX) 600 MG 12 hr tablet Take 1 tablet (600 mg total) by mouth 2 (two) times daily. 08/22/18  Yes Barton Dubois, MD  metFORMIN (GLUCOPHAGE-XR) 500 MG 24 hr tablet Take 500 mg by mouth daily with breakfast.  08/10/17  Yes [provider]  metoprolol tartrate (LOPRESSOR) 50 MG tablet Take 50 mg by mouth 2 (two) times daily. 04/12/18  Yes [provider]  potassium chloride SA (K-DUR) 20 MEQ tablet Take 20 mEq by mouth 2 (two) times daily.   Yes [provider]  PROAIR HFA 108 (90 BASE) MCG/ACT inhaler Inhale 2 puffs into the lungs every 6 (six) hours as needed for wheezing or shortness of breath.  03/06/11  Yes [provider]  rivaroxaban (XARELTO) 20 MG TABS tablet Take 1 tablet (20 mg total) by mouth daily with supper. Stop Xarelto until Monday (08/26/18) in anticipation for kyphoplasty 08/22/18  Yes Barton Dubois, MD  TRELEGY ELLIPTA 100-62.5-25 MCG/INH AEPB Take 1 puff by mouth daily. 08/27/17  Yes [provider]  polyethylene glycol (MIRALAX / GLYCOLAX)  17 g packet Take 17 g by mouth daily as needed for mild constipation. Patient not taking: Reported on 10/24/2018 08/22/18   Barton Dubois, MD   Allergies  Allergen Reactions   Codeine Other (See Comments)    "jittery"   Review of Systems  Unable to perform ROS: Acuity of condition    Physical Exam Vitals signs and nursing note reviewed.  Constitutional:      Appearance: She is ill-appearing.  Pulmonary:     Comments: On bipap Skin:    General: Skin is warm.  Neurological:     Mental Status: She is disoriented.     Vital Signs: BP 109/69    Pulse 93    Temp 97.8 F (36.6 C) (Axillary)    Resp 20    Ht 5\' 2"  (1.575 m)    Wt 71.1 kg    SpO2 99%    BMI 28.67 kg/m  Pain Scale: 0-10 POSS *See Group Information*: 2-Acceptable,Slightly drowsy, easily aroused Pain Score: Asleep   SpO2: SpO2: 99 % O2 Device:SpO2: 99 % O2 Flow Rate: .O2 Flow Rate (L/min): 3 L/min  IO: Intake/output summary:   Intake/Output Summary (Last 24 hours) at 11/07/2018 1845 Last data filed at 11/07/2018 [id number] Gross per 24 hour  Intake 350 ml  Output 100 ml  Net 250 ml    LBM: Last BM Date: (Unknown) Baseline Weight: Weight: 74.6 kg Most recent weight: Weight: 71.1 kg     Palliative Assessment/Data: PPS: 40%     Thank you for this consult. Palliative medicine will continue to follow and assist as needed.   Time In: 1700 Time Out: 1810 Time Total: 70 minutes Greater than 50%  of this time was spent counseling and coordinating care related to the above assessment and plan.  Signed by: Mariana Kaufman, AGNP-C Palliative Medicine    Please contact Palliative Medicine Team  phone at [phone] for questions and concerns.  For individual provider: See Shea Evans

## 2018-11-07 NOTE — Progress Notes (Signed)
CRITICAL VALUE ALERT  Critical Value:  pCO2 95.5  Date & Time Notied:  11/07/2018 @ 1250  Provider Notified: Dr. Manuella Ghazi  Orders Received/Actions taken: see chart

## 2018-11-07 NOTE — TOC Initial Note (Signed)
Transition of Care [facility]) - Initial/Assessment Note    Patient Details  Name: Eileen [name] MRN: [id number] Date of Birth: [birth year]  [facility] CM/SW Contact:    Ihor Gully, LCSW Phone Number: 11/07/2018, 4:38 PM  Clinical Narrative:                 Pt admitted from home. Pt well known to TOC from previous admissions. Pt lives with her daughter.  Pt has all the DME she needs at home. Active with [facility] (RN, Education officer, museum).  Will follow for discharge planning.    Expected Discharge Plan: [place] Barriers to Discharge: No Barriers Identified   Patient Goals and CMS Choice        Expected Discharge Plan and Services Expected Discharge Plan: [place]       Living arrangements for the past 2 months: Single Family Home Expected Discharge Date: 11/11/18                                    Prior Living Arrangements/Services Living arrangements for the past 2 months: Single Family Home Lives with:: Adult Children Patient language and need for interpreter reviewed:: Yes Do you feel safe going back to the place where you live?: Yes      Need for Family Participation in Patient Care: Yes (Comment) Care giver support system in place?: Yes (comment) Current home services: DME Criminal Activity/Legal Involvement Pertinent to Current Situation/Hospitalization: No - Comment as needed  Activities of Daily Living Home Assistive Devices/Equipment: None ADL Screening (condition at time of admission) Patient's cognitive ability adequate to safely complete daily activities?: No Is the patient deaf or have difficulty hearing?: No Does the patient have difficulty seeing, even when wearing glasses/contacts?: No Does the patient have difficulty concentrating, remembering, or making decisions?: Yes Patient able to express need for assistance with ADLs?: Yes Does the patient have difficulty dressing or bathing?:  Yes Independently performs ADLs?: No Communication: Independent Dressing (OT): Needs assistance Is this a change from baseline?: Pre-admission baseline Grooming: Needs assistance Is this a change from baseline?: Pre-admission baseline Feeding: Independent Bathing: Needs assistance Is this a change from baseline?: Pre-admission baseline Toileting: Needs assistance Is this a change from baseline?: Pre-admission baseline In/Out Bed: Needs assistance Is this a change from baseline?: Pre-admission baseline Walks in Home: Needs assistance Is this a change from baseline?: Pre-admission baseline Does the patient have difficulty walking or climbing stairs?: No Weakness of Legs: None Weakness of Arms/Hands: None  Permission Sought/Granted                  Emotional Assessment Appearance:: Appears stated age   Affect (typically observed): Unable to Assess   Alcohol / Substance Use: Not Applicable Psych Involvement: No (comment)  Admission diagnosis:  HCAP (healthcare-associated pneumonia) [J18.9] Altered mental status, unspecified altered mental status type [R41.82] Patient Active Problem List   Diagnosis Date Noted  . Bronchiectasis with acute exacerbation ([place])   . COPD exacerbation ([place]) 10/18/2018  . Anemia 10/18/2018  . Osteoporosis 08/30/2018  . Closed compression fracture of body of L1 vertebra ([place])   . Constipation   . Acute and chronic respiratory failure (acute-on-chronic) ([place]) 08/10/2018  . Left lower lobe pneumonia ([place]) 08/10/2018  . Leukocytosis 08/10/2018  . Chronic respiratory failure with hypoxia ([place]) 06/28/2018  . Acute upper GI bleeding 01/20/2018  . Lower GI  bleed 01/20/2018  . Acute respiratory failure with hypoxia ([place]) 09/17/2017  . Diabetes mellitus ([place]) 08/31/2017  . Acute hypoxemic respiratory failure ([place]) 08/31/2017  . Hemorrhoids 10/30/2016  . Atrial fibrillation with RVR ([place]) 09/24/2016  . Atrial flutter ([place]) 09/23/2016  . Obstructive  chronic bronchitis with exacerbation ([place]) 05/23/2016  . Vitamin D deficiency 05/23/2016  . Acute on chronic respiratory failure ([place]) 03/24/2015  . Metabolic encephalopathy [id number]  . HCAP (healthcare-associated pneumonia) 03/24/2015  . Anticoagulation management encounter   . New onset atrial flutter ([place]) 03/21/2015  . Obesity 03/21/2015  . Atrial flutter with rapid ventricular response ([place]) 03/21/2015  . COPD (chronic obstructive pulmonary disease) ([place]) 12/20/2014  . COPD with acute exacerbation ([place]) 12/20/2014  . 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 Breast cancer ([place]) 10/11/2009   PCP:  Lavella Lemons, PA Pharmacy:   [facility], [place] [address] [place] [address] Phone: [phone] Fax: [phone]     Social Determinants of Health (SDOH) Interventions    Readmission Risk Interventions Readmission Risk Prevention Plan 10/25/2018 10/24/2018 08/22/2018  Transportation Screening - Complete Complete  PCP or Specialist Appt within 5-7 Days - - Complete  Home Care Screening - - Complete  Medication Review (RN CM) - - Complete  Medication Review (RN Care Manager) - Complete -  PCP or Specialist appointment within 3-5 days of discharge Complete - -  [place] or Home Care Consult Complete - -  SW Recovery Care/Counseling Consult - Complete -  Palliative Care Screening Not Complete - -  Comments Palliative not available - -  [place] Not Applicable - -  Some recent data might be hidden

## 2018-11-07 NOTE — Patient Outreach (Signed)
Pt was scheduled to be contacted today for post hospital follow up third attempt, noted pt admitted to [facility] ICU 11/06/18 with altered mental status.  PLAN Continue to follow  Jacqlyn Larsen [facility], BSN [place] Coordinator [phone]

## 2018-11-07 NOTE — Progress Notes (Signed)
PT Cancellation Note  Patient Details Name: Eileen [name] MRN: [id number] DOB: 1946/01/08   Cancelled Treatment:    Reason Eval/Treat Not Completed: Medical issues which prohibited therapy.  Patient transferred to a higher level of care and will need new PT consult resume therapy when patient is medically stable.  Thank you.   12:19 PM, 11/07/18 Lonell Grandchild, MPT Physical Therapist with [phone] office [phone] mobile phone

## 2018-11-07 NOTE — Progress Notes (Signed)
Patient was scheduled for MRI prior to being transferred to ICU on bipap. Patient is still on bipap, which cannot be used in MRI. Order was discontinued. Will reorder MRI, if still needed, when patient is off of bipap.

## 2018-11-07 NOTE — Progress Notes (Signed)
Per chart review, patient with recent transfer to step-down and new start of Bi-Pap from having been on 3LPM O2. Clinical staff to consult with nursing regarding appropriateness for therapy due to change in medical status. Will attempt to see as/if schedule allows and as patient is medically appropriate.    Deniece Ree PT, DPT, CBIS  Supplemental Physical Therapist [facility]    Pager [phone] Acute Rehab Office [phone]

## 2018-11-07 NOTE — Progress Notes (Signed)
CRITICAL VALUE ALERT  Critical Value:  PH 7.042   PCO2 >120  Date & Time Notied:  11/07/2018  Provider Notified: Dr. Manuella Ghazi  Orders Received/Actions taken: Transfer patient to step down unit. Will put in bed request and transfer patient

## 2018-11-07 NOTE — Progress Notes (Signed)
PROGRESS NOTE    Eileen [name]  [id number] DOB: 1945/11/16 DOA: 11/06/2018 PCP: Lavella Lemons, PA   Brief Narrative:  Per HPI: Eileen [name]  is a 73 y.o. female, with history of right breast cancer in remission, L1 compression fracture status post kyphoplasty, iron deficiency anemia, diverticulosis, diabetes mellitus type 2, atrial flutter, moderate mitral regurgitation (E echo 06-27-2026), COPD, chronic respiratory failure on 3.5 L at baseline presents to the ER today because she does not feel well.  Patient has dementia and is not able to offer reliable history.  She is alert and oriented x2-to self and place, but she is not able to accurately identify her daughter at bedside.  History is taken from chart review, daughter at bedside, and primary caregiver [name] (another daughter) whose phone number is [phone].  Patiently recently admitted on October 24, 2018 for COPD exacerbation.  At that time she was started on vancomycin, cefepime, and had MRSA screening, urine Legionella antigen, and urine strep antigen, as well as 2 sets of blood cultures.  At that time blood cultures had no growth at 5 days.  MRSA screen by PCR has been consistently negative on October 24, 2018, October 19, 2018, September 26, 2018, Aug 10, 2018, etc.  Urinary antigen for strep pneumo has been negative consistently as well over the last 3 years.  Urine Legionella was negative at that admission as well.  Unfortunately I am unable to locate the sputum culture.  Today, daughter [name], reports that patient requested to call ambulance to come into the ED due to not feeling well.  Daughter does report the patient has had decreased appetite over the past 2 days.  Although she has eaten it has not been her normal amount.  Patient has not had any vomiting diarrhea or foul-smelling urine.  Patient has not had a cough more than her normal.  She has not required an increase in her oxygen supplementation.  Daughter thinks that this  complaint is secondary to dementia.  She reports that the patient gets much worse every evening.  And it was not until evening that the patient started saying that she did not feel well and wanted to go to the hospital.  Daughter reports that she tried to get patient to describe which symptoms she was experiencing.  Patient just repeated over and over that she was not feeling well.  Patient herself is not able to contribute to review of systems due to dementia.  At the moment of the exam patient reports that her breathing feels so/so.  She reports that sometimes it feels good, but most the time it feels so-so.  8/27: Patient was admitted with recurrence of pneumonia thought to be HCAP.  She is noted to have worsening symptoms of dementia that seem to exacerbate in the evenings.  She does not appear to be taking her home medications as prescribed.  She has been given 2 doses of Ativan overnight and is quite encephalopathic this morning and cannot be aroused by tactile stimulation.  She was noted to have significant hypercapnia and was transferred to stepdown unit and placed on BiPAP with improvement in PCO2 levels noted thus far.  Assessment & Plan:   Active Problems:   HCAP (healthcare-associated pneumonia)   Questionable recurrent pneumonia  -Continue current antibiotics with Rocephin and azithromycin -Doubtful that patient has a true recurrence of pneumonia and will check procalcitonin level today  Acute hypercapnic encephalopathy secondary to sedation from Ativan in setting of COPD -Transfer to  stepdown unit and placed on BiPAP -PCO2 levels improving on repeat ABG -Reassess ABG in a.m.  History of dementia with sundowning along with hallucinations/agitation -Patient taken off Seroquel in the outpatient setting -Appears to be worsening and appreciate palliative care evaluation -We will likely resume Seroquel once mentation has improved -We will need neurology follow-up outpatient   Hyperkalemia -Patient given Lokelma -We will start Lasix IV twice daily to draw off fluid as well as assist with hyperkalemia -Repeat a.m. labs and monitor on telemetry  Thyromegaly -TSH stable with last drawn on 10/26/2018  Type 2 diabetes with hyperglycemia -Recent hemoglobin A1c 8.5% on 7/20 -Continue management with SSI  History of atrial flutter -Rate controlled -Continue amiodarone and Xarelto  DVT prophylaxis: Xarelto Code Status: Full Family Communication: Discussed with daughter at bedside Disposition Plan: Continue treatment of pneumonia as well as hypercapnia on BiPAP.   Consultants:   None  Procedures:   None  Antimicrobials:  Anti-infectives (From admission, onward)   Start     Dose/Rate Route Frequency Ordered Stop   11/06/18 2300  cefTRIAXone (ROCEPHIN) 2 g in sodium chloride 0.9 % 100 mL IVPB     2 g 200 mL/hr over 30 Minutes Intravenous Every 24 hours 11/06/18 2258 11/11/18 2259   11/06/18 2300  azithromycin (ZITHROMAX) 500 mg in sodium chloride 0.9 % 250 mL IVPB     500 mg 250 mL/hr over 60 Minutes Intravenous Every 24 hours 11/06/18 2258 11/11/18 2259   11/06/18 2200  ceFEPIme (MAXIPIME) 2 g in sodium chloride 0.9 % 100 mL IVPB  Status:  Discontinued     2 g 200 mL/hr over 30 Minutes Intravenous Every 12 hours 11/06/18 2039 11/06/18 2141   11/06/18 2045  Vancomycin (VANCOCIN) 1,500 mg in sodium chloride 0.9 % 500 mL IVPB  Status:  Discontinued     1,500 mg 250 mL/hr over 120 Minutes Intravenous  Once 11/06/18 2039 11/06/18 2141       Subjective: Patient seen and evaluated today with persistent somnolence noted.  Transferred to stepdown unit and started on BiPAP due to hypercapnia noted on ABG.  Objective: Vitals:   11/07/18 1100 11/07/18 1117 11/07/18 1200 11/07/18 1300  BP: 102/70  101/64 106/68  Pulse:   85 87  Resp:   (!) 26 (!) 27  Temp:  (!) 97.4 F (36.3 C)    TempSrc:  Axillary    SpO2:   93% 98%  Weight: 71.1 kg     Height: 5'  2" (1.575 m)       Intake/Output Summary (Last 24 hours) at 11/07/2018 1420 Last data filed at 11/07/2018 [id number] Gross per 24 hour  Intake 350 ml  Output 100 ml  Net 250 ml   Filed Weights   11/06/18 2312 11/07/18 1100  Weight: 74.6 kg 71.1 kg    Examination:  General exam: Appears calm and comfortable, currently on BiPAP Respiratory system: Clear to auscultation. Respiratory effort normal.  On BiPAP. Cardiovascular system: S1 & S2 heard, RRR. No JVD, murmurs, rubs, gallops or clicks. No pedal edema. Gastrointestinal system: Abdomen is nondistended, soft and nontender. No organomegaly or masses felt. Normal bowel sounds heard. Central nervous system: Somnolent but arousable Extremities: No significant edema Skin: No rashes, lesions or ulcers Psychiatry: Cannot be evaluated given patient condition.    Data Reviewed: I have personally reviewed following labs and imaging studies  CBC: Recent Labs  Lab 11/06/18 1623 11/07/18 0638  WBC 14.9* 11.6*  NEUTROABS 12.3*  --   HGB  10.7* 10.7*  HCT 39.0 39.2  MCV 101.6* 103.2*  PLT 307 [id number]   Basic Metabolic Panel: Recent Labs  Lab 11/06/18 1623 11/06/18 1648 11/07/18 0638  NA 138  --  142  K 5.0  --  5.9*  CL 91*  --  94*  CO2 37*  --  36*  GLUCOSE 180*  --  218*  BUN 14  --  19  CREATININE 0.80  --  1.05*  CALCIUM 9.3  --  9.1  MG  --  2.0  --   PHOS  --  3.7  --    GFR: Estimated Creatinine Clearance: 44.1 mL/min (A) (by C-G formula based on SCr of 1.05 mg/dL (H)). Liver Function Tests: Recent Labs  Lab 11/06/18 1623 11/07/18 0638  AST 24 82*  ALT 26 70*  ALKPHOS 89 103  BILITOT 0.6 0.7  PROT 6.8 6.8  ALBUMIN 3.8 3.6   No results for input(s): LIPASE, AMYLASE in the last 168 hours. No results for input(s): AMMONIA in the last 168 hours. Coagulation Profile: No results for input(s): INR, PROTIME in the last 168 hours. Cardiac Enzymes: No results for input(s): CKTOTAL, CKMB, CKMBINDEX, TROPONINI in the  last 168 hours. BNP (last 3 results) No results for input(s): PROBNP in the last 8760 hours. HbA1C: No results for input(s): HGBA1C in the last 72 hours. CBG: Recent Labs  Lab 11/06/18 1604 11/07/18 0751 11/07/18 1115  GLUCAP 174* 168* 184*   Lipid Profile: No results for input(s): CHOL, HDL, LDLCALC, TRIG, CHOLHDL, LDLDIRECT in the last 72 hours. Thyroid Function Tests: No results for input(s): TSH, T4TOTAL, FREET4, T3FREE, THYROIDAB in the last 72 hours. Anemia Panel: No results for input(s): VITAMINB12, FOLATE, FERRITIN, TIBC, IRON, RETICCTPCT in the last 72 hours. Sepsis Labs: No results for input(s): PROCALCITON, LATICACIDVEN in the last 168 hours.  Recent Results (from the past 240 hour(s))  SARS Coronavirus 2 [facility] order, Performed in [facility] hospital lab) Nasopharyngeal Urine, Random     Status: None   Collection Time: 11/06/18  4:38 PM   Specimen: Urine, Random; Nasopharyngeal  Result Value Ref Range Status   SARS Coronavirus 2 NEGATIVE NEGATIVE Final    Comment: (NOTE) If result is NEGATIVE SARS-CoV-2 target nucleic acids are NOT DETECTED. The SARS-CoV-2 RNA is generally detectable in upper and lower  respiratory specimens during the acute phase of infection. The lowest  concentration of SARS-CoV-2 viral copies this assay can detect is 250  copies / mL. A negative result does not preclude SARS-CoV-2 infection  and should not be used as the sole basis for treatment or other  patient management decisions.  A negative result may occur with  improper specimen collection / handling, submission of specimen other  than nasopharyngeal swab, presence of viral mutation(s) within the  areas targeted by this assay, and inadequate number of viral copies  (<250 copies / mL). A negative result must be combined with clinical  observations, patient history, and epidemiological information. If result is POSITIVE SARS-CoV-2 target nucleic acids are DETECTED. The SARS-CoV-2  RNA is generally detectable in upper and lower  respiratory specimens dur ing the acute phase of infection.  Positive  results are indicative of active infection with SARS-CoV-2.  Clinical  correlation with patient history and other diagnostic information is  necessary to determine patient infection status.  Positive results do  not rule out bacterial infection or co-infection with other viruses. If result is PRESUMPTIVE POSTIVE SARS-CoV-2 nucleic acids MAY BE PRESENT.  A presumptive positive result was obtained on the submitted specimen  and confirmed on repeat testing.  While 2019 novel coronavirus  (SARS-CoV-2) nucleic acids may be present in the submitted sample  additional confirmatory testing may be necessary for epidemiological  and / or clinical management purposes  to differentiate between  SARS-CoV-2 and other Sarbecovirus currently known to infect humans.  If clinically indicated additional testing with an alternate test  methodology [phone]) is advised. The SARS-CoV-2 RNA is generally  detectable in upper and lower respiratory sp ecimens during the acute  phase of infection. The expected result is Negative. Fact Sheet for Patients:  StrictlyIdeas.no Fact Sheet for Healthcare Providers: BankingDealers.co.za This test is not yet approved or cleared by the [place] FDA and has been authorized for detection and/or diagnosis of SARS-CoV-2 by FDA under an Emergency Use Authorization (EUA).  This EUA will remain in effect (meaning this test can be used) for the duration of the COVID-19 declaration under Section 564(b)(1) of the Act, 21 U.S.C. section 360bbb-3(b)(1), unless the authorization is terminated or revoked sooner. Performed at [facility], [address], [place], [place] [address]   MRSA PCR Screening     Status: None   Collection Time: 11/06/18 11:00 PM   Specimen: Nasal Mucosa; Nasopharyngeal  Result Value Ref  Range Status   MRSA by PCR NEGATIVE NEGATIVE Final    Comment:        The GeneXpert MRSA Assay (FDA approved for NASAL specimens only), is one component of a comprehensive MRSA colonization surveillance program. It is not intended to diagnose MRSA infection nor to guide or monitor treatment for MRSA infections. Performed at [facility], [address] [place], [place] [address]          Radiology Studies: Ct Head Wo Contrast  Result Date: 11/06/2018 CLINICAL DATA:  Altered level of consciousness EXAM: CT HEAD WITHOUT CONTRAST TECHNIQUE: Contiguous axial images were obtained from the base of the skull through the vertex without intravenous contrast. COMPARISON:  10/26/2018 FINDINGS: Brain: No acute intracranial abnormality. Specifically, no hemorrhage, hydrocephalus, mass lesion, acute infarction, or significant intracranial injury. Vascular: No hyperdense vessel or unexpected calcification. Skull: No acute calvarial abnormality. Sinuses/Orbits: Visualized paranasal sinuses and mastoids clear. Orbital soft tissues unremarkable. Other: None IMPRESSION: No acute intracranial abnormality. Electronically Signed   By: Rolm Baptise M.D.   On: 11/06/2018 18:45   Dg Chest Port 1 View  Result Date: 11/06/2018 CLINICAL DATA:  Shortness of breath EXAM: PORTABLE CHEST 1 VIEW COMPARISON:  October 23, 2018 FINDINGS: There is persistent airspace opacity in the left base. There is atelectatic change in the right base. Heart remains enlarged with mild pulmonary venous hypertension. No adenopathy. No bone lesions. IMPRESSION: Persistent airspace consolidation consistent with pneumonia left base. Atelectasis right base. Underlying cardiomegaly with a degree of pulmonary vascular congestion. Electronically Signed   By: Lowella Grip III M.D.   On: 11/06/2018 17:08        Scheduled Meds: . amiodarone  200 mg Oral Daily  . Chlorhexidine Gluconate Cloth  6 each Topical Daily  . docusate sodium  100  mg Oral Daily  . fluticasone furoate-vilanterol  1 puff Inhalation Daily   And  . umeclidinium bromide  1 puff Inhalation Daily  . furosemide  60 mg Oral QODAY  . insulin aspart  0-9 Units Subcutaneous TID WC  . metoprolol tartrate  50 mg Oral BID  . rivaroxaban  15 mg Oral Q supper  . sodium zirconium cyclosilicate  10 g Oral Once   Continuous Infusions: . azithromycin 500 mg (11/07/18 0145)  . cefTRIAXone (ROCEPHIN)  IV 2 g (11/07/18 0047)     LOS: 1 day    Time spent: 30 minutes    Izell Labat Darleen Crocker, DO [facility] Pager [phone]  If 7PM-7AM, please contact night-coverage [email or web address] Password [email or web address] 11/07/2018, 2:20 PM

## 2018-11-08 ENCOUNTER — Inpatient Hospital Stay (HOSPITAL_COMMUNITY): Payer: Medicare Other

## 2018-11-08 LAB — BLOOD GAS, ARTERIAL
Acid-Base Excess: 18.9 mmol/L — ABNORMAL HIGH (ref 0.0–2.0)
Bicarbonate: 40.7 mmol/L — ABNORMAL HIGH (ref 20.0–28.0)
FIO2: 45
O2 Saturation: 93.6 %
Patient temperature: 37
pCO2 arterial: 93 mmHg (ref 32.0–48.0)
pH, Arterial: 7.315 — ABNORMAL LOW (ref 7.350–7.450)
pO2, Arterial: 76.4 mmHg — ABNORMAL LOW (ref 83.0–108.0)

## 2018-11-08 LAB — CBC
HCT: 37.2 % (ref 36.0–46.0)
Hemoglobin: 10.8 g/dL — ABNORMAL LOW (ref 12.0–15.0)
MCH: 28.3 pg (ref 26.0–34.0)
MCHC: 29 g/dL — ABNORMAL LOW (ref 30.0–36.0)
MCV: 97.4 fL (ref 80.0–100.0)
Platelets: 203 10*3/uL (ref 150–400)
RBC: 3.82 MIL/uL — ABNORMAL LOW (ref 3.87–5.11)
RDW: 14.5 % (ref 11.5–15.5)
WBC: 13.5 10*3/uL — ABNORMAL HIGH (ref 4.0–10.5)
nRBC: 0 % (ref 0.0–0.2)

## 2018-11-08 LAB — GLUCOSE, CAPILLARY
Glucose-Capillary: 105 mg/dL — ABNORMAL HIGH (ref 70–99)
Glucose-Capillary: 112 mg/dL — ABNORMAL HIGH (ref 70–99)
Glucose-Capillary: 157 mg/dL — ABNORMAL HIGH (ref 70–99)
Glucose-Capillary: 101 mg/dL — ABNORMAL HIGH (ref 70–99)

## 2018-11-08 LAB — STREP PNEUMONIAE URINARY ANTIGEN: Strep Pneumo Urinary Antigen: NEGATIVE

## 2018-11-08 LAB — BASIC METABOLIC PANEL
Anion gap: 14 (ref 5–15)
BUN: 19 mg/dL (ref 8–23)
CO2: 38 mmol/L — ABNORMAL HIGH (ref 22–32)
Calcium: 8.8 mg/dL — ABNORMAL LOW (ref 8.9–10.3)
Chloride: 91 mmol/L — ABNORMAL LOW (ref 98–111)
Creatinine, Ser: 0.82 mg/dL (ref 0.44–1.00)
GFR calc Af Amer: >60 mL/min (ref >60)
GFR calc non Af Amer: >60 mL/min (ref >60)
Glucose, Bld: 101 mg/dL — ABNORMAL HIGH (ref 70–99)
Potassium: 5 mmol/L (ref 3.5–5.1)
Sodium: 143 mmol/L (ref 135–145)

## 2018-11-08 LAB — HIV ANTIBODY (ROUTINE TESTING W REFLEX): HIV Screen 4th Generation wRfx: NONREACTIVE

## 2018-11-08 MED ORDER — RIVAROXABAN 20 MG PO TABS
20.0000 mg | ORAL_TABLET | Freq: Every day | ORAL | Status: DC
  Administered 2018-11-09 – 2018-11-10 (×2): 20 mg via ORAL
  Filled 2018-11-08 (×3): qty 1

## 2018-11-08 MED ORDER — PIPERACILLIN-TAZOBACTAM 3.375 G IVPB
3.3750 g | Freq: Three times a day (TID) | INTRAVENOUS | Status: DC
  Administered 2018-11-08 – 2018-11-11 (×10): 3.375 g via INTRAVENOUS
  Filled 2018-11-08 (×10): qty 50

## 2018-11-08 MED ORDER — LEVALBUTEROL HCL 0.63 MG/3ML IN NEBU
0.6300 mg | INHALATION_SOLUTION | Freq: Four times a day (QID) | RESPIRATORY_TRACT | Status: DC | PRN
  Administered 2018-11-08 – 2018-11-10 (×5): 0.63 mg via RESPIRATORY_TRACT
  Filled 2018-11-08 (×6): qty 3

## 2018-11-08 MED ORDER — QUETIAPINE FUMARATE 25 MG PO TABS
25.0000 mg | ORAL_TABLET | Freq: Every day | ORAL | Status: DC
  Administered 2018-11-08: 19:00:00 25 mg via ORAL
  Filled 2018-11-08: qty 1

## 2018-11-08 MED ORDER — HALOPERIDOL LACTATE 5 MG/ML IJ SOLN: 2.0000 mg | Freq: Four times a day (QID) | INTRAMUSCULAR | Status: DC | PRN

## 2018-11-08 MED ORDER — IPRATROPIUM BROMIDE 0.02 % IN SOLN
RESPIRATORY_TRACT | Status: AC
  Administered 2018-11-08: 21:00:00 0.5 mg
  Filled 2018-11-08: qty 2.5

## 2018-11-08 NOTE — Progress Notes (Addendum)
Patient's normal PCO2 is high 68 to 120. She basically became uncompensated some time yesterday with ph 7.042  8/27 10:15 date may be incorrect as thought this happened today ?? Have decreased oxygen on BiPAP to 32 saturation low 90's. She does have left pneumonia by x-ray. She wears 3.5 liters oxygen at home. Will add atrovent q6 prn because it doubtful she is using  mdi correctly at this time. It is doubtful PCO2 can be corrected to normal range or an where at all close to normal. She is still wearing mit's for confusion. Will try to get her better compensated not sure if this will help her dementia ? Still wearing BiPAP

## 2018-11-08 NOTE — Progress Notes (Addendum)
Patient continues to pull off bipap. Patient continues to pull off oxygen probe. She is trying to get out of the bed. She states she is hallucinating. She has pulled off the safety mitts. Dr. Manuella Ghazi aware. Awaiting new orders.  Dr. Manuella Ghazi placed order for haldol. [facility] called to let us know that due to prolonged QT interval risk with patient taking amiodarone, that it was not advised.   Dr. Manuella Ghazi made aware of pharmacy's recommendation, and instead of using medication intervention, we decided bilateral wrist restraints would be better for patient. Bilateral wrist restraints placed on patient. Patient was given 2200 seroquel early per Dr. Manuella Ghazi verbal order. Patient was placed back on bipap, with respiratory therapy's assistance. Patient has purewick in place and verbalized understanding to stay in hospital and not to pull at bipap. Will continue to monitor.

## 2018-11-08 NOTE — Consult Note (Signed)
Consult requested by: [facility] hospitalist, Dr. Manuella Ghazi Consult requested for: Acute on chronic respiratory failure  HPI: This is a 73 year old that I know well from previous hospitalizations and from seeing her in my office.  She is known to have heart failure, COPD, chronic hypoxic respiratory failure, diabetes type 2, atrial flutter, mitral regurgitation previous history of right breast cancer in remission and iron deficiency anemia.  She came to the hospital because she said she did not feel well in general.  She is not been eating well.  She is not had any vomiting diarrhea or foul-smelling urine did not cough more than normally.  She apparently has trouble with being confused every evening.  She has modified her medications on her own.  History is from the medical record as she is on BiPAP and not able to provide any history.  She was admitted and started on treatment but became more somnolent and was found to have a PCO2 of greater than 120 with a pH of 7.04.  She was transferred to stepdown and started on BiPAP and her PCO2 has improved but it still in the 90s.  I think she does have chronic hypercapnia based on her blood gas and her metabolic profile.  She was more alert when Dr. Manuella Ghazi evaluated her but when I went in she did not really respond to me except open her eyes.  Past Medical History:  Diagnosis Date  . Asthma   . Atrial flutter ([place])   . Cancer [facility])    Right breast  . COPD (chronic obstructive pulmonary disease) ([place])   . History of breast cancer    right breast  . Hypertension   . On home O2   . Type 2 diabetes mellitus ([facility])      Family History  Problem Relation Age of Onset  . Cancer Mother        lung  . Diabetes Brother      Social History   Socioeconomic History  . Marital status: Single    Spouse name: Not on file  . Number of children: Not on file  . Years of education: Not on file  . Highest education level: Not on file  Occupational History  . Not on file   Social Needs  . Financial resource strain: Not hard at all  . Food insecurity    Worry: Never true    Inability: Never true  . Transportation needs    Medical: Patient refused    Non-medical: Patient refused  Tobacco Use  . Smoking status: Former Smoker    Packs/day: 0.50    Years: 32.00    Pack years: 16.00    Types: Cigarettes    Start date: 12/12/1962    Quit date: 03/13/1994    Years since quitting: 24.6  . Smokeless tobacco: Never Used  Substance and Sexual Activity  . Alcohol use: No    Alcohol/week: 0.0 standard drinks  . Drug use: No  . Sexual activity: Not on file  Lifestyle  . Physical activity    Days per week: Patient refused    Minutes per session: Patient refused  . Stress: Not at all  Relationships  . Social Herbalist on phone: Three times a week    Gets together: Three times a week    Attends religious service: More than 4 times per year    Active member of club or organization: Yes    Attends meetings of clubs or organizations: More than  4 times per year    Relationship status: Never married  Other Topics Concern  . Not on file  Social History Narrative  . Not on file     ROS: Unobtainable    Objective: Vital signs in last 24 hours: Temp:  [97.4 F (36.3 C)-98.9 F (37.2 C)] 98.9 F (37.2 C) (08/28 0400) Pulse Rate:  [74-114] 86 (08/28 0600) Resp:  [14-27] 17 (08/28 0600) BP: (87-145)/(61-76) 111/71 (08/28 0600) SpO2:  [88 %-100 %] 98 % (08/28 0600) Weight:  [69.8 kg-71.1 kg] 69.8 kg (08/28 0500) Weight change: -3.5 kg Last BM Date: (Unknown)  Intake/Output from previous day: 08/27 0701 - 08/28 0700 In: 350 [IV Piggyback:350] Out: 1300 [Urine:1300]  PHYSICAL EXAM Constitutional: She is on BiPAP.  As mentioned she is not very responsive now although she was earlier..  Eyes: Pupils react ears nose mouth and throat: Limited exam but no focal abnormalities.  Cardiovascular: Her heart is regular with normal heart sounds.   Respiratory: She is on BiPAP.  She has bilateral rhonchi.  Gastrointestinal: Her abdomen is soft with no masses.  Neurological: Cannot assess.  Psychiatric: Cannot assess.  Musculoskeletal: Cannot assess  Lab Results: Basic Metabolic Panel: Recent Labs    11/06/18 1648 11/07/18 0638 11/08/18 0448  NA  --  142 143  K  --  5.9* 5.0  CL  --  94* 91*  CO2  --  36* 38*  GLUCOSE  --  218* 101*  BUN  --  19 19  CREATININE  --  1.05* 0.82  CALCIUM  --  9.1 8.8*  MG 2.0  --   --   PHOS 3.7  --   --    Liver Function Tests: Recent Labs    11/06/18 1623 11/07/18 0638  AST 24 82*  ALT 26 70*  ALKPHOS 89 103  BILITOT 0.6 0.7  PROT 6.8 6.8  ALBUMIN 3.8 3.6   No results for input(s): LIPASE, AMYLASE in the last 72 hours. No results for input(s): AMMONIA in the last 72 hours. CBC: Recent Labs    11/06/18 1623 11/07/18 0638 11/08/18 0448  WBC 14.9* 11.6* 13.5*  NEUTROABS 12.3*  --   --   HGB 10.7* 10.7* 10.8*  HCT 39.0 39.2 37.2  MCV 101.6* 103.2* 97.4  PLT 307 288 203   Cardiac Enzymes: No results for input(s): CKTOTAL, CKMB, CKMBINDEX, TROPONINI in the last 72 hours. BNP: No results for input(s): PROBNP in the last 72 hours. D-Dimer: No results for input(s): DDIMER in the last 72 hours. CBG: Recent Labs    11/06/18 1604 11/07/18 0751 11/07/18 1115 11/07/18 1603 11/07/18 2108  GLUCAP 174* 168* 184* 105* 96   Hemoglobin A1C: No results for input(s): HGBA1C in the last 72 hours. Fasting Lipid Panel: No results for input(s): CHOL, HDL, LDLCALC, TRIG, CHOLHDL, LDLDIRECT in the last 72 hours. Thyroid Function Tests: No results for input(s): TSH, T4TOTAL, FREET4, T3FREE, THYROIDAB in the last 72 hours. Anemia Panel: No results for input(s): VITAMINB12, FOLATE, FERRITIN, TIBC, IRON, RETICCTPCT in the last 72 hours. Coagulation: No results for input(s): LABPROT, INR in the last 72 hours. Urine Drug Screen: Drugs of Abuse  No results found for: LABOPIA, COCAINSCRNUR,  LABBENZ, AMPHETMU, THCU, LABBARB  Alcohol Level: No results for input(s): ETH in the last 72 hours. Urinalysis: Recent Labs    11/06/18 [place]  LABSPEC 1.009  PHURINE 5.0  [place] NEGATIVE  BILIRUBINUR NEGATIVE  KETONESUR NEGATIVE  PROTEINUR  30*  NITRITE POSITIVE*  LEUKOCYTESUR TRACE*   Misc. Labs:   ABGS: Recent Labs    11/08/18 0500  PHART 7.315*  PO2ART 76.4*  HCO3 40.7*     MICROBIOLOGY: Recent Results (from the past 240 hour(s))  SARS Coronavirus 2 [facility] order, Performed in [facility] hospital lab) Nasopharyngeal Urine, Random     Status: None   Collection Time: 11/06/18  4:38 PM   Specimen: Urine, Random; Nasopharyngeal  Result Value Ref Range Status   SARS Coronavirus 2 NEGATIVE NEGATIVE Final    Comment: (NOTE) If result is NEGATIVE SARS-CoV-2 target nucleic acids are NOT DETECTED. The SARS-CoV-2 RNA is generally detectable in upper and lower  respiratory specimens during the acute phase of infection. The lowest  concentration of SARS-CoV-2 viral copies this assay can detect is 250  copies / mL. A negative result does not preclude SARS-CoV-2 infection  and should not be used as the sole basis for treatment or other  patient management decisions.  A negative result may occur with  improper specimen collection / handling, submission of specimen other  than nasopharyngeal swab, presence of viral mutation(s) within the  areas targeted by this assay, and inadequate number of viral copies  (<250 copies / mL). A negative result must be combined with clinical  observations, patient history, and epidemiological information. If result is POSITIVE SARS-CoV-2 target nucleic acids are DETECTED. The SARS-CoV-2 RNA is generally detectable in upper and lower  respiratory specimens dur ing the acute phase of infection.  Positive  results are indicative of active infection with SARS-CoV-2.  Clinical  correlation with patient  history and other diagnostic information is  necessary to determine patient infection status.  Positive results do  not rule out bacterial infection or co-infection with other viruses. If result is PRESUMPTIVE POSTIVE SARS-CoV-2 nucleic acids MAY BE PRESENT.   A presumptive positive result was obtained on the submitted specimen  and confirmed on repeat testing.  While 2019 novel coronavirus  (SARS-CoV-2) nucleic acids may be present in the submitted sample  additional confirmatory testing may be necessary for epidemiological  and / or clinical management purposes  to differentiate between  SARS-CoV-2 and other Sarbecovirus currently known to infect humans.  If clinically indicated additional testing with an alternate test  methodology [phone]) is advised. The SARS-CoV-2 RNA is generally  detectable in upper and lower respiratory sp ecimens during the acute  phase of infection. The expected result is Negative. Fact Sheet for Patients:  StrictlyIdeas.no Fact Sheet for Healthcare Providers: BankingDealers.co.za This test is not yet approved or cleared by the [place] FDA and has been authorized for detection and/or diagnosis of SARS-CoV-2 by FDA under an Emergency Use Authorization (EUA).  This EUA will remain in effect (meaning this test can be used) for the duration of the COVID-19 declaration under Section 564(b)(1) of the Act, 21 U.S.C. section 360bbb-3(b)(1), unless the authorization is terminated or revoked sooner. Performed at [facility], [address] [place], [place] [address]   MRSA PCR Screening     Status: None   Collection Time: 11/06/18 11:00 PM   Specimen: Nasal Mucosa; Nasopharyngeal  Result Value Ref Range Status   MRSA by PCR NEGATIVE NEGATIVE Final    Comment:        The GeneXpert MRSA Assay (FDA approved for NASAL specimens only), is one component of a comprehensive MRSA colonization surveillance program.  It is not intended to diagnose MRSA infection nor to guide or monitor treatment for MRSA infections.  Performed at [facility], [address] [place], [place] [address]     Studies/Results: Ct Head Wo Contrast  Result Date: 11/06/2018 CLINICAL DATA:  Altered level of consciousness EXAM: CT HEAD WITHOUT CONTRAST TECHNIQUE: Contiguous axial images were obtained from the base of the skull through the vertex without intravenous contrast. COMPARISON:  10/26/2018 FINDINGS: Brain: No acute intracranial abnormality. Specifically, no hemorrhage, hydrocephalus, mass lesion, acute infarction, or significant intracranial injury. Vascular: No hyperdense vessel or unexpected calcification. Skull: No acute calvarial abnormality. Sinuses/Orbits: Visualized paranasal sinuses and mastoids clear. Orbital soft tissues unremarkable. Other: None IMPRESSION: No acute intracranial abnormality. Electronically Signed   By: Rolm Baptise M.D.   On: 11/06/2018 18:45   Dg Chest Port 1 View  Result Date: 11/06/2018 CLINICAL DATA:  Shortness of breath EXAM: PORTABLE CHEST 1 VIEW COMPARISON:  October 23, 2018 FINDINGS: There is persistent airspace opacity in the left base. There is atelectatic change in the right base. Heart remains enlarged with mild pulmonary venous hypertension. No adenopathy. No bone lesions. IMPRESSION: Persistent airspace consolidation consistent with pneumonia left base. Atelectasis right base. Underlying cardiomegaly with a degree of pulmonary vascular congestion. Electronically Signed   By: Lowella Grip III M.D.   On: 11/06/2018 17:08    Medications:  Prior to Admission:  Medications Prior to Admission  Medication Sig Dispense Refill Last Dose  . acetaminophen (TYLENOL) 500 MG tablet Take 500 mg by mouth every 8 (eight) hours as needed for mild pain or headache.    11/06/2018 at Unknown time  . albuterol (PROVENTIL) (2.5 MG/3ML) 0.083% nebulizer solution Take 2.5 mg by nebulization every 6 (six)  hours as needed for wheezing or shortness of breath.   11/06/2018 at Unknown time  . amiodarone (PACERONE) 200 MG tablet Take 1 tablet (200 mg total) by mouth daily. 90 tablet 3 11/06/2018 at Unknown time  . docusate sodium (COLACE) 100 MG capsule Take 1 capsule (100 mg total) by mouth 2 (two) times daily. (Patient taking differently: Take 100 mg by mouth daily. ) 30 capsule 1 11/06/2018 at Unknown time  . furosemide (LASIX) 40 MG tablet Take 1.5 tablets (60 mg total) by mouth daily. (Patient taking differently: Take 60 mg by mouth every other day. )   11/06/2018 at Unknown time  . guaiFENesin (MUCINEX) 600 MG 12 hr tablet Take 1 tablet (600 mg total) by mouth 2 (two) times daily. 30 tablet 0 Past Month at Unknown time  . metFORMIN (GLUCOPHAGE-XR) 500 MG 24 hr tablet Take 500 mg by mouth daily with breakfast.   1 11/06/2018 at Unknown time  . metoprolol tartrate (LOPRESSOR) 50 MG tablet Take 50 mg by mouth 2 (two) times daily.   11/06/2018 at 0900  . potassium chloride SA (K-DUR) 20 MEQ tablet Take 20 mEq by mouth 2 (two) times daily.   11/06/2018 at Unknown time  . PROAIR HFA 108 (90 BASE) MCG/ACT inhaler Inhale 2 puffs into the lungs every 6 (six) hours as needed for wheezing or shortness of breath.    11/06/2018 at Unknown time  . rivaroxaban (XARELTO) 20 MG TABS tablet Take 1 tablet (20 mg total) by mouth daily with supper. Stop Xarelto until Monday (08/26/18) in anticipation for kyphoplasty 21 tablet 0 11/05/2018 at 1900  . TRELEGY ELLIPTA 100-62.5-25 MCG/INH AEPB Take 1 puff by mouth daily.  0 11/06/2018 at Unknown time  . polyethylene glycol (MIRALAX / GLYCOLAX) 17 g packet Take 17 g by mouth daily as needed for mild constipation. (Patient not taking:  Reported on 10/24/2018) 28 each 0 Unknown at Unknown time   Scheduled: . amiodarone  200 mg Oral Daily  . Chlorhexidine Gluconate Cloth  6 each Topical Daily  . docusate sodium  100 mg Oral Daily  . fluticasone furoate-vilanterol  1 puff Inhalation Daily    And  . umeclidinium bromide  1 puff Inhalation Daily  . furosemide  40 mg Intravenous Q12H  . insulin aspart  0-9 Units Subcutaneous TID WC  . metoprolol tartrate  50 mg Oral BID  . QUEtiapine  25 mg Oral QHS  . rivaroxaban  15 mg Oral Q supper  . sodium zirconium cyclosilicate  10 g Oral Once   Continuous: . piperacillin-tazobactam (ZOSYN)  IV 3.375 g (11/08/18 0802)   [id number], polyethylene glycol  Assesment: She was admitted with acute on chronic hypoxic and hypercapnic respiratory failure and she is on BiPAP.  Goals of care discussions have been initiated but at this point she is very high risk of needing intubation and mechanical ventilation.  She has COPD at baseline with chronic shortness of breath and with multiple hospitalizations in the last year  She has healthcare associated pneumonia but concern is that this may be related to aspiration.  She expressed to Dr. Manuella Ghazi that she would get choked on food and now with the history that sounds like she may have some dementia and sundowning that is much more likely  She has dementia with behavioral disturbance  She has had cardiac arrhythmias and seems to have some heart failure Active Problems:   HCAP (healthcare-associated pneumonia)   Chronic obstructive bronchitis ([place])   Dementia with behavioral disturbance ([place])   Goals of care, counseling/discussion   Advanced care planning/counseling discussion   Palliative care by specialist    Plan: If she becomes more arousable would go ahead with seeing if she can come off of BiPAP with close supervision.  If she does not become more arousable by approximately noon I would plan to go ahead and do another blood gas and see where she is.  Thanks for allowing me to see her with you    LOS: 2 days   Alonza Bogus 11/08/2018, 8:09 AM

## 2018-11-08 NOTE — Progress Notes (Signed)
Patient continued pulling at bipap. Patient is awake and oriented. Bipap removed, placed on standy-by and placed on 5L [place]. Patient is aware that she needs to wear the oxygen and that she needs to be in the hospital.

## 2018-11-08 NOTE — Evaluation (Signed)
Clinical/Bedside Swallow Evaluation Patient Details  Name: Eileen [name] MRN: [id number] Date of Birth: [birth year]  Today's Date: 11/08/2018 Time: SLP Start Time (ACUTE ONLY): 1133 SLP Stop Time (ACUTE ONLY): 1149 SLP Time Calculation (min) (ACUTE ONLY): 16 min  Past Medical History:  Past Medical History:  Diagnosis Date  . Asthma   . Atrial flutter ([place])   . Cancer [facility])    Right breast  . COPD (chronic obstructive pulmonary disease) ([place])   . History of breast cancer    right breast  . Hypertension   . On home O2   . Type 2 diabetes mellitus ([place])    Past Surgical History:  Past Surgical History:  Procedure Laterality Date  . BREAST SURGERY  2011   Right breast mastectomy  . CARDIOVERSION N/A 06/27/2018   Procedure: CARDIOVERSION;  Surgeon: Arnoldo Lenis, MD;  [facility];  Service: Endoscopy;  Laterality: N/A;  . CESAREAN SECTION    . COLONOSCOPY N/A 01/22/2018   Procedure: COLONOSCOPY;  Surgeon: Rogene Houston, MD;  Location: [facility];  Service: Endoscopy;  Laterality: N/A;  . HERNIA REPAIR     RIH  . IR VERTEBROPLASTY LUMBAR BX INC UNI/BIL INC/INJECT/IMAGING  09/05/2018  . MASTECTOMY  2011   right breast  . TEE WITHOUT CARDIOVERSION N/A 06/27/2018   Procedure: TRANSESOPHAGEAL ECHOCARDIOGRAM (TEE) WITH PROPOFOL;  Surgeon: Arnoldo Lenis, MD;  Location[facility];  Service: Endoscopy;  Laterality: N/A;   HPI:  Eileen [name]  is a 73 y.o. female, with history of right breast cancer in remission, L1 compression fracture status post kyphoplasty, iron deficiency anemia, diverticulosis, diabetes mellitus type 2, atrial flutter, moderate mitral regurgitation (E echo 06-27-2026), COPD, chronic respiratory failure on 3.5 L at baseline presents to the ER today because she does not feel well.  Patient has dementia and is not able to offer reliable history. Pt has been on BiPAP but is tolerating this morning on 5L [place].    Assessment / Plan /  Recommendation Clinical Impression  SLP administered clinical swallowing evaluation; Pt did not present with s/sx of oropharyngeal dysphagia with any trials presented (regular, puree and thin textures). SLP also observed medication administration whole with liquids (3 pills at once), without incident. No overt s/sx of aspiration were noted. Pt did report difficulty with usage of her R arm/hand, this was reported to RN who also had noted possible R arm weakness. There are no further ST needs noted at this time. Thank you for this referral. SLP Visit Diagnosis: Dysphagia, unspecified (R13.10)    Aspiration Risk  Mild aspiration risk    Diet Recommendation Regular;Thin liquid   Liquid Administration via: Straw;Cup Medication Administration: Whole meds with liquid Supervision: Patient able to self feed;Intermittent supervision to cue for compensatory strategies Compensations: Slow rate;Small sips/bites Postural Changes: Seated upright at 90 degrees;Remain upright for at least 30 minutes after po intake    Other  Recommendations Oral Care Recommendations: Oral care BID   Follow up Recommendations None      Frequency and Duration            Prognosis Prognosis for Safe Diet Advancement: Good      Swallow Study   General Date of Onset: 11/06/18 HPI: Eileen [name]  is a 73 y.o. female, with history of right breast cancer in remission, L1 compression fracture status post kyphoplasty, iron deficiency anemia, diverticulosis, diabetes mellitus type 2, atrial flutter, moderate mitral regurgitation (E echo 06-27-2026), COPD, chronic respiratory failure  on 3.5 L at baseline presents to the ER today because she does not feel well.  Patient has dementia and is not able to offer reliable history. Pt has been on BiPAP but is tolerating this morning on 5L [place].  Type of Study: Bedside Swallow Evaluation Previous Swallow Assessment: BSE 09/2016 Diet Prior to this Study: Regular;Thin liquids Temperature  Spikes Noted: No Respiratory Status: Nasal cannula History of Recent Intubation: No Behavior/Cognition: Alert;Cooperative;Confused Oral Cavity Assessment: Within Functional Limits Oral Care Completed by SLP: Recent completion by staff Vision: Functional for self-feeding Self-Feeding Abilities: Able to feed self;Needs assist Patient Positioning: Upright in bed Baseline Vocal Quality: Normal Volitional Cough: Strong Volitional Swallow: Able to elicit    Oral/Motor/Sensory Function Overall Oral Motor/Sensory Function: Within functional limits   Ice Chips Ice chips: Not tested   Thin Liquid Thin Liquid: Within functional limits    Nectar Thick Nectar Thick Liquid: Not tested   Honey Thick Honey Thick Liquid: Not tested   Puree Puree: Within functional limits   Solid     Solid: Within functional limits     Mykira Hofmeister H. Roddie Mc, [place] Speech Language Pathologist  Wende Bushy 11/08/2018,11:54 AM

## 2018-11-08 NOTE — Progress Notes (Signed)
Patient's family members are not communicating with each other and are putting staff in the middle. This RN spoke with one of patient's sons and he is not on the chart. The son did not have the password that is on the chart. New information was not given out, as requested by patient's daughter, Eileen [name]. Patient is confused and unable to tell me who she would like information given to. There are five children and palliative spoke with [facility] the other day and Eileen [name] is supposed to be talking to the other siblings to start making decisions about patient's wishes and medical treatment. Son was told that there were decisions him and his siblings needed to make. Patient's son was cooperative and understanding.

## 2018-11-08 NOTE — Progress Notes (Signed)
ANTIBIOTIC CONSULT NOTE-Preliminary  Pharmacy Consult for zosyn Indication: pneumonia  Allergies  Allergen Reactions  . Codeine Other (See Comments)    "jittery"    Patient Measurements: Height: 5\' 2"  (157.5 cm) Weight: 153 lb 14.1 oz (69.8 kg) IBW/kg (Calculated) : 50.1 Adjusted Body Weight:   Vital Signs: Temp: 98.9 F (37.2 C) (08/28 0400) Temp Source: Axillary (08/28 0400) BP: 111/71 (08/28 0600) Pulse Rate: 86 (08/28 0600)  Labs: Recent Labs    11/06/18 1623 11/07/18 0638 11/08/18 0448  WBC 14.9* 11.6* 13.5*  HGB 10.7* 10.7* 10.8*  PLT 307 288 203  CREATININE 0.80 1.05* 0.82    Estimated Creatinine Clearance: 55.9 mL/min (by C-G formula based on SCr of 0.82 mg/dL).  No results for input(s): VANCOTROUGH, VANCOPEAK, VANCORANDOM, GENTTROUGH, GENTPEAK, GENTRANDOM, TOBRATROUGH, TOBRAPEAK, TOBRARND, AMIKACINPEAK, AMIKACINTROU, AMIKACIN in the last 72 hours.   Microbiology: Recent Results (from the past 720 hour(s))  SARS Coronavirus 2 [facility] order, Performed in [facility] hospital lab) Nasopharyngeal Nasopharyngeal Swab     Status: None   Collection Time: 10/18/18  7:20 PM   Specimen: Nasopharyngeal Swab  Result Value Ref Range Status   SARS Coronavirus 2 NEGATIVE NEGATIVE Final    Comment: (NOTE) If result is NEGATIVE SARS-CoV-2 target nucleic acids are NOT DETECTED. The SARS-CoV-2 RNA is generally detectable in upper and lower  respiratory specimens during the acute phase of infection. The lowest  concentration of SARS-CoV-2 viral copies this assay can detect is 250  copies / mL. A negative result does not preclude SARS-CoV-2 infection  and should not be used as the sole basis for treatment or other  patient management decisions.  A negative result may occur with  improper specimen collection / handling, submission of specimen other  than nasopharyngeal swab, presence of viral mutation(s) within the  areas targeted by this assay, and inadequate number of  viral copies  (<250 copies / mL). A negative result must be combined with clinical  observations, patient history, and epidemiological information. If result is POSITIVE SARS-CoV-2 target nucleic acids are DETECTED. The SARS-CoV-2 RNA is generally detectable in upper and lower  respiratory specimens dur ing the acute phase of infection.  Positive  results are indicative of active infection with SARS-CoV-2.  Clinical  correlation with patient history and other diagnostic information is  necessary to determine patient infection status.  Positive results do  not rule out bacterial infection or co-infection with other viruses. If result is PRESUMPTIVE POSTIVE SARS-CoV-2 nucleic acids MAY BE PRESENT.   A presumptive positive result was obtained on the submitted specimen  and confirmed on repeat testing.  While 2019 novel coronavirus  (SARS-CoV-2) nucleic acids may be present in the submitted sample  additional confirmatory testing may be necessary for epidemiological  and / or clinical management purposes  to differentiate between  SARS-CoV-2 and other Sarbecovirus currently known to infect humans.  If clinically indicated additional testing with an alternate test  methodology [phone]) is advised. The SARS-CoV-2 RNA is generally  detectable in upper and lower respiratory sp ecimens during the acute  phase of infection. The expected result is Negative. Fact Sheet for Patients:  StrictlyIdeas.no Fact Sheet for Healthcare Providers: BankingDealers.co.za This test is not yet approved or cleared by the [place] FDA and has been authorized for detection and/or diagnosis of SARS-CoV-2 by FDA under an Emergency Use Authorization (EUA).  This EUA will remain in effect (meaning this test can be used) for the duration of the COVID-19 declaration under Section 564(b)(1)  of the Act, 21 U.S.C. section 360bbb-3(b)(1), unless the authorization is  terminated or revoked sooner. Performed at [facility], [address] [place], [place] [address]   Culture, blood (Routine X 2) w Reflex to ID Panel     Status: None   Collection Time: 10/18/18 11:43 PM   Specimen: Left Antecubital; Blood  Result Value Ref Range Status   Specimen Description LEFT ANTECUBITAL  Final   Special Requests   Final    BOTTLES DRAWN AEROBIC AND ANAEROBIC Blood Culture adequate volume   Culture   Final    NO GROWTH 5 DAYS Performed at [facility], [address] [place], [place] [address]    Report Status 10/24/2018 FINAL  Final  Culture, blood (Routine X 2) w Reflex to ID Panel     Status: None   Collection Time: 10/19/18 12:00 AM   Specimen: BLOOD LEFT WRIST  Result Value Ref Range Status   Specimen Description BLOOD LEFT WRIST  Final   Special Requests   Final    BOTTLES DRAWN AEROBIC AND ANAEROBIC Blood Culture adequate volume   Culture   Final    NO GROWTH 5 DAYS Performed at [facility], [address], [place], [place] [address]    Report Status 10/24/2018 FINAL  Final  MRSA PCR Screening     Status: None   Collection Time: 10/19/18  6:04 AM   Specimen: Nasal Mucosa; Nasopharyngeal  Result Value Ref Range Status   MRSA by PCR NEGATIVE NEGATIVE Final    Comment:        The GeneXpert MRSA Assay (FDA approved for NASAL specimens only), is one component of a comprehensive MRSA colonization surveillance program. It is not intended to diagnose MRSA infection nor to guide or monitor treatment for MRSA infections. Performed at [facility], [address], [place], [place] [address]   SARS Coronavirus 2 [facility] order, Performed in [facility] hospital lab) Nasopharyngeal Nasopharyngeal Swab     Status: None   Collection Time: 10/23/18 11:14 PM   Specimen: Nasopharyngeal Swab  Result Value Ref Range Status   SARS Coronavirus 2 NEGATIVE NEGATIVE Final    Comment: (NOTE) If result is NEGATIVE SARS-CoV-2 target nucleic acids are NOT  DETECTED. The SARS-CoV-2 RNA is generally detectable in upper and lower  respiratory specimens during the acute phase of infection. The lowest  concentration of SARS-CoV-2 viral copies this assay can detect is 250  copies / mL. A negative result does not preclude SARS-CoV-2 infection  and should not be used as the sole basis for treatment or other  patient management decisions.  A negative result may occur with  improper specimen collection / handling, submission of specimen other  than nasopharyngeal swab, presence of viral mutation(s) within the  areas targeted by this assay, and inadequate number of viral copies  (<250 copies / mL). A negative result must be combined with clinical  observations, patient history, and epidemiological information. If result is POSITIVE SARS-CoV-2 target nucleic acids are DETECTED. The SARS-CoV-2 RNA is generally detectable in upper and lower  respiratory specimens dur ing the acute phase of infection.  Positive  results are indicative of active infection with SARS-CoV-2.  Clinical  correlation with patient history and other diagnostic information is  necessary to determine patient infection status.  Positive results do  not rule out bacterial infection or co-infection with other viruses. If result is PRESUMPTIVE POSTIVE SARS-CoV-2 nucleic acids MAY BE PRESENT.   A presumptive positive result was obtained on  the submitted specimen  and confirmed on repeat testing.  While 2019 novel coronavirus  (SARS-CoV-2) nucleic acids may be present in the submitted sample  additional confirmatory testing may be necessary for epidemiological  and / or clinical management purposes  to differentiate between  SARS-CoV-2 and other Sarbecovirus currently known to infect humans.  If clinically indicated additional testing with an alternate test  methodology [phone]) is advised. The SARS-CoV-2 RNA is generally  detectable in upper and lower respiratory sp ecimens during  the acute  phase of infection. The expected result is Negative. Fact Sheet for Patients:  StrictlyIdeas.no Fact Sheet for Healthcare Providers: BankingDealers.co.za This test is not yet approved or cleared by the [place] FDA and has been authorized for detection and/or diagnosis of SARS-CoV-2 by FDA under an Emergency Use Authorization (EUA).  This EUA will remain in effect (meaning this test can be used) for the duration of the COVID-19 declaration under Section 564(b)(1) of the Act, 21 U.S.C. section 360bbb-3(b)(1), unless the authorization is terminated or revoked sooner. Performed at [facility], [address], [place], [place] [address]   Culture, blood (Routine X 2) w Reflex to ID Panel     Status: None   Collection Time: 10/24/18  2:21 AM   Specimen: Left Antecubital; Blood  Result Value Ref Range Status   Specimen Description LEFT ANTECUBITAL  Final   Special Requests   Final    BOTTLES DRAWN AEROBIC AND ANAEROBIC Blood Culture adequate volume   Culture   Final    NO GROWTH 5 DAYS Performed at [facility], [address], [place], [place] [address]    Report Status 10/29/2018 FINAL  Final  Culture, blood (Routine X 2) w Reflex to ID Panel     Status: None   Collection Time: 10/24/18  2:27 AM   Specimen: BLOOD LEFT HAND  Result Value Ref Range Status   Specimen Description BLOOD LEFT HAND  Final   Special Requests   Final    BOTTLES DRAWN AEROBIC AND ANAEROBIC Blood Culture adequate volume   Culture   Final    NO GROWTH 5 DAYS Performed at [facility], [address], [place], [place] [address]    Report Status 10/29/2018 FINAL  Final  MRSA PCR Screening     Status: None   Collection Time: 10/24/18  7:57 AM   Specimen: Nasopharyngeal  Result Value Ref Range Status   MRSA by PCR NEGATIVE NEGATIVE Final    Comment:        The GeneXpert MRSA Assay (FDA approved for NASAL specimens only), is one component of  a comprehensive MRSA colonization surveillance program. It is not intended to diagnose MRSA infection nor to guide or monitor treatment for MRSA infections. Performed at [facility], [address], [place], [place] [address]   Culture, Urine     Status: None   Collection Time: 10/26/18 10:38 AM   Specimen: Urine, Random  Result Value Ref Range Status   Specimen Description   Final    URINE, RANDOM Performed at [facility], [address] [place], [place] [address]    Special Requests   Final    NONE Performed at [facility], [address] [place], [name] [address]    Culture   Final    NO GROWTH Performed at [facility], [place] [address], [place], [place] [address]    Report Status 10/28/2018 FINAL  Final  SARS Coronavirus 2 [facility] order, Performed in [facility] hospital  lab) Nasopharyngeal Urine, Random     Status: None   Collection Time: 11/06/18  4:38 PM   Specimen: Urine, Random; Nasopharyngeal  Result Value Ref Range Status   SARS Coronavirus 2 NEGATIVE NEGATIVE Final    Comment: (NOTE) If result is NEGATIVE SARS-CoV-2 target nucleic acids are NOT DETECTED. The SARS-CoV-2 RNA is generally detectable in upper and lower  respiratory specimens during the acute phase of infection. The lowest  concentration of SARS-CoV-2 viral copies this assay can detect is 250  copies / mL. A negative result does not preclude SARS-CoV-2 infection  and should not be used as the sole basis for treatment or other  patient management decisions.  A negative result may occur with  improper specimen collection / handling, submission of specimen other  than nasopharyngeal swab, presence of viral mutation(s) within the  areas targeted by this assay, and inadequate number of viral copies  (<250 copies / mL). A negative result must be combined with clinical  observations, patient history, and epidemiological information. If result is POSITIVE SARS-CoV-2 target nucleic acids are  DETECTED. The SARS-CoV-2 RNA is generally detectable in upper and lower  respiratory specimens dur ing the acute phase of infection.  Positive  results are indicative of active infection with SARS-CoV-2.  Clinical  correlation with patient history and other diagnostic information is  necessary to determine patient infection status.  Positive results do  not rule out bacterial infection or co-infection with other viruses. If result is PRESUMPTIVE POSTIVE SARS-CoV-2 nucleic acids MAY BE PRESENT.   A presumptive positive result was obtained on the submitted specimen  and confirmed on repeat testing.  While 2019 novel coronavirus  (SARS-CoV-2) nucleic acids may be present in the submitted sample  additional confirmatory testing may be necessary for epidemiological  and / or clinical management purposes  to differentiate between  SARS-CoV-2 and other Sarbecovirus currently known to infect humans.  If clinically indicated additional testing with an alternate test  methodology [phone]) is advised. The SARS-CoV-2 RNA is generally  detectable in upper and lower respiratory sp ecimens during the acute  phase of infection. The expected result is Negative. Fact Sheet for Patients:  StrictlyIdeas.no Fact Sheet for Healthcare Providers: BankingDealers.co.za This test is not yet approved or cleared by the [place] FDA and has been authorized for detection and/or diagnosis of SARS-CoV-2 by FDA under an Emergency Use Authorization (EUA).  This EUA will remain in effect (meaning this test can be used) for the duration of the COVID-19 declaration under Section 564(b)(1) of the Act, 21 U.S.C. section 360bbb-3(b)(1), unless the authorization is terminated or revoked sooner. Performed at [facility], [address], [place], [place] [address]   MRSA PCR Screening     Status: None   Collection Time: 11/06/18 11:00 PM   Specimen: Nasal Mucosa;  Nasopharyngeal  Result Value Ref Range Status   MRSA by PCR NEGATIVE NEGATIVE Final    Comment:        The GeneXpert MRSA Assay (FDA approved for NASAL specimens only), is one component of a comprehensive MRSA colonization surveillance program. It is not intended to diagnose MRSA infection nor to guide or monitor treatment for MRSA infections. Performed at [facility], [address], [place], [place] [address]     Medical History: Past Medical History:  Diagnosis Date  . Asthma   . Atrial flutter ([place])   . Cancer [facility])    Right breast  . COPD (chronic obstructive pulmonary disease) ([place])   .  History of breast cancer    right breast  . Hypertension   . On home O2   . Type 2 diabetes mellitus ([facility])     Medications:  Anti-infectives (From admission, onward)   Start     Dose/Rate Route Frequency Ordered Stop   11/08/18 0730  piperacillin-tazobactam (ZOSYN) IVPB 3.375 g     3.375 g 12.5 mL/hr over 240 Minutes Intravenous Every 8 hours 11/08/18 0719     11/06/18 2300  cefTRIAXone (ROCEPHIN) 2 g in sodium chloride 0.9 % 100 mL IVPB  Status:  Discontinued     2 g 200 mL/hr over 30 Minutes Intravenous Every 24 hours 11/06/18 2258 11/08/18 0658   11/06/18 2300  azithromycin (ZITHROMAX) 500 mg in sodium chloride 0.9 % 250 mL IVPB  Status:  Discontinued     500 mg 250 mL/hr over 60 Minutes Intravenous Every 24 hours 11/06/18 2258 11/08/18 0658   11/06/18 2200  ceFEPIme (MAXIPIME) 2 g in sodium chloride 0.9 % 100 mL IVPB  Status:  Discontinued     2 g 200 mL/hr over 30 Minutes Intravenous Every 12 hours 11/06/18 2039 11/06/18 2141   11/06/18 2045  Vancomycin (VANCOCIN) 1,500 mg in sodium chloride 0.9 % 500 mL IVPB  Status:  Discontinued     1,500 mg 250 mL/hr over 120 Minutes Intravenous  Once 11/06/18 2039 11/06/18 2141      Assessment: 73 yo with recurrence of pneumonia thought to be HCAP. ABX changing from azithromycin and ceftriaxone to Zosyn. Pharmacy asked to dose  zosyn   Plan:  Preliminary review of pertinent patient information completed.  Protocol will be initiated with dose(s) of zosyn 3.375 g IV Q8 hour.  Forestine Na clinical pharmacist will complete review during morning rounds to assess patient and finalize treatment regimen if needed.  Liisa Picone Scarlett, RPH 11/08/2018,7:20 AM

## 2018-11-08 NOTE — Progress Notes (Signed)
PROGRESS NOTE    [name]  [id number] DOB: 03/18/1945 DOA: 11/06/2018 PCP: Eileen Lemons, PA   Brief Narrative:  Per HPI: [name],with history of right breast cancer in remission, L1 compression fracture status post kyphoplasty, iron deficiency anemia, diverticulosis, diabetes mellitus type 2, atrial flutter, moderate mitral regurgitation (E echo 06-27-2026), COPD, chronic respiratory failure on 3.5 L at baseline presents to the ER today because she does not feel well. Patient has dementia and is not able to offer reliable history. She is alert and oriented x2-to self and place,but she is not able to accurately identify her daughter at bedside. History is taken from chart review, daughter at bedside, and primary caregiver Eileen [name] (another daughter) whose phone number is [phone].  Patiently recently admitted on October 24, 2018 for COPD exacerbation. At that time she was started on vancomycin, cefepime, and had MRSA screening, urine Legionella antigen, and urine strep antigen,as well as 2 sets of blood cultures. At that time blood cultures had no growth at 5 days. MRSA screen by PCR has been consistently negative on October 24, 2018, October 19, 2018, September 26, 2018, Aug 10, 2018, etc. Urinary antigen for strep pneumo has been negative consistently as well over the last 3 years. Urine Legionella was negative at that admission as well. Unfortunately I am unable to locate the sputum culture.  Today, daughter Eileen [name], reports that patient requested to call ambulance to come into the ED due to not feeling well. Daughter does report the patient has had decreased appetite over the past 2 days. Although she has eaten it has not been her normal amount. Patient has not had any vomiting diarrhea or foul-smelling urine. Patient has not had a cough more than her normal. She has not required an increase in her oxygen supplementation. Daughter thinks that this  complaint is secondary to dementia. She reports that the patient gets much worse every evening. And it was not until evening that the patient started saying that she did not feel well and wanted to go to the hospital.Daughter reports that she tried to get patient to describe which symptoms she was experiencing. Patient just repeated over and over that she was not feeling well. Patient herself is not able to contribute to review of systems due to dementia. At the moment of the exam patient reports that her breathing feels so/so. She reports that sometimes it feels good, but most the time it feels so-so.  8/27: Patient was admitted with recurrence of pneumonia thought to be HCAP.  She is noted to have worsening symptoms of dementia that seem to exacerbate in the evenings.  She does not appear to be taking her home medications as prescribed.  She has been given 2 doses of Ativan overnight and is quite encephalopathic this morning and cannot be aroused by tactile stimulation.  She was noted to have significant hypercapnia and was transferred to stepdown unit and placed on BiPAP with improvement in PCO2 levels noted thus far.  8/28: Patient more alert and awake this am, but continues to have persistent hypercarbia on ABG. She appears to be diuresing well with lasix and potassium levels have improved. Will switch azithromycin and Rocephin to Zosyn for aspiration coverage. SLP evaluation ordered and pending. Appreciate Pulmonology evaluation regarding hypercarbia and recurrent pneumonia. Seroquel to be initiated tonight.  Assessment & Plan:   Active Problems:   HCAP (healthcare-associated pneumonia)   Chronic obstructive bronchitis (HCC)   Dementia with behavioral disturbance ([place])   Goals  of care, counseling/discussion   Advanced care planning/counseling discussion   Palliative care by specialist   Recurrent pneumonia possibly related to aspiration -Procalcitonin elevated -DC Rocephin and  azithromycin and switch to Zosyn -SLP evaluation ordered and pending today  Acute hypercapnic encephalopathy secondary to sedation from Ativan in setting of COPD -Elevated pCO2 noted on AM ABG; may be able to discontinue soon -Pulmonology consulted; appreciate eval and recommendations -Reassess ABG in a.m.  History of dementia with sundowning along with hallucinations/agitation -Patient taken off Seroquel in the outpatient setting, but will continue tonight -Olanzapine could not be given given Amiodarone reaction -Please avoid lorazepam -Appreciate palliative care evaluation -We will need neurology follow-up outpatient regarding dementia -Hold Brain MRI for now  Hyperkalemia/mild volume overload-improved -Patient given [facility] 8/26 -Continue IV Lasix bid for now with good UO noted -Repeat a.m. labs and monitor on telemetry  Thyromegaly -TSH stable with last drawn on 10/26/2018  Type 2 diabetes with hyperglycemia -Recent hemoglobin A1c 8.5% on 7/20 -Continue management with SSI  History of atrial flutter-currently in SR -Rate controlled -Continue amiodarone, metoprolol, and Xarelto  DVT prophylaxis: Xarelto Code Status: Full Family Communication: Discussed with daughter Eileen [name] at bedside on 8/27 Disposition Plan: Continue treatment of aspiration pneumonia as well as hypercapnia on BiPAP. Appreciate Pulmonology evaluation today. Will need eventual palliative care follow up at home after DC.   Consultants:   Palliative  Pulmonology  Procedures:   None  Antimicrobials:  Anti-infectives (From admission, onward)   Start     Dose/Rate Route Frequency Ordered Stop   11/06/18 2300  cefTRIAXone (ROCEPHIN) 2 g in sodium chloride 0.9 % 100 mL IVPB  Status:  Discontinued     2 g 200 mL/hr over 30 Minutes Intravenous Every 24 hours 11/06/18 2258 11/08/18 0658   11/06/18 2300  azithromycin (ZITHROMAX) 500 mg in sodium chloride 0.9 % 250 mL IVPB  Status:  Discontinued      500 mg 250 mL/hr over 60 Minutes Intravenous Every 24 hours 11/06/18 2258 11/08/18 0658   11/06/18 2200  ceFEPIme (MAXIPIME) 2 g in sodium chloride 0.9 % 100 mL IVPB  Status:  Discontinued     2 g 200 mL/hr over 30 Minutes Intravenous Every 12 hours 11/06/18 2039 11/06/18 2141   11/06/18 2045  Vancomycin (VANCOCIN) 1,500 mg in sodium chloride 0.9 % 500 mL IVPB  Status:  Discontinued     1,500 mg 250 mL/hr over 120 Minutes Intravenous  Once 11/06/18 2039 11/06/18 2141       Subjective: Patient seen and evaluated today with no new acute complaints or concerns. No acute concerns or events noted overnight. She has rested well on bipap and is more alert and awake this am.  Objective: Vitals:   11/08/18 0400 11/08/18 0500 11/08/18 0526 11/08/18 0600  BP:  117/66  111/71  Pulse: 84 79 82 86  Resp: 20 (!) 27 14 17   Temp:      TempSrc:      SpO2: 96% 99% 98% 98%  Weight:      Height:        Intake/Output Summary (Last 24 hours) at 11/08/2018 0659 Last data filed at 11/08/2018 0500 Gross per 24 hour  Intake 350 ml  Output 1300 ml  Net -950 ml   Filed Weights   11/06/18 2312 11/07/18 1100  Weight: 74.6 kg 71.1 kg    Examination:  General exam: Appears calm and comfortable  Respiratory system: Clear to auscultation. Respiratory effort normal. Currently on bipap 45%  Cardiovascular system: S1 & S2 heard, RRR. No JVD, murmurs, rubs, gallops or clicks. No pedal edema. Gastrointestinal system: Abdomen is nondistended, soft and nontender. No organomegaly or masses felt. Normal bowel sounds heard. Central nervous system: Alert and awake Extremities: No edema bilaterally; SCDs noted Skin: No rashes, lesions or ulcers Psychiatry: Judgement and insight appear normal. Mood & affect appropriate.     Data Reviewed: I have personally reviewed following labs and imaging studies  CBC: Recent Labs  Lab 11/06/18 1623 11/07/18 0638 11/08/18 0448  WBC 14.9* 11.6* 13.5*  NEUTROABS 12.3*   --   --   HGB 10.7* 10.7* 10.8*  HCT 39.0 39.2 37.2  MCV 101.6* 103.2* 97.4  PLT 307 288 [id number]   Basic Metabolic Panel: Recent Labs  Lab 11/06/18 1623 11/06/18 1648 11/07/18 0638 11/08/18 0448  NA 138  --  142 143  K 5.0  --  5.9* 5.0  CL 91*  --  94* 91*  CO2 37*  --  36* 38*  GLUCOSE 180*  --  218* 101*  BUN 14  --  19 19  CREATININE 0.80  --  1.05* 0.82  CALCIUM 9.3  --  9.1 8.8*  MG  --  2.0  --   --   PHOS  --  3.7  --   --    GFR: Estimated Creatinine Clearance: 56.4 mL/min (by C-G formula based on SCr of 0.82 mg/dL). Liver Function Tests: Recent Labs  Lab 11/06/18 1623 11/07/18 0638  AST 24 82*  ALT 26 70*  ALKPHOS 89 103  BILITOT 0.6 0.7  PROT 6.8 6.8  ALBUMIN 3.8 3.6   No results for input(s): LIPASE, AMYLASE in the last 168 hours. No results for input(s): AMMONIA in the last 168 hours. Coagulation Profile: No results for input(s): INR, PROTIME in the last 168 hours. Cardiac Enzymes: No results for input(s): CKTOTAL, CKMB, CKMBINDEX, TROPONINI in the last 168 hours. BNP (last 3 results) No results for input(s): PROBNP in the last 8760 hours. HbA1C: No results for input(s): HGBA1C in the last 72 hours. CBG: Recent Labs  Lab 11/06/18 1604 11/07/18 0751 11/07/18 1115 11/07/18 1603 11/07/18 2108  GLUCAP 174* 168* 184* 105* 96   Lipid Profile: No results for input(s): CHOL, HDL, LDLCALC, TRIG, CHOLHDL, LDLDIRECT in the last 72 hours. Thyroid Function Tests: No results for input(s): TSH, T4TOTAL, FREET4, T3FREE, THYROIDAB in the last 72 hours. Anemia Panel: No results for input(s): VITAMINB12, FOLATE, FERRITIN, TIBC, IRON, RETICCTPCT in the last 72 hours. Sepsis Labs: Recent Labs  Lab 11/07/18 1528  PROCALCITON 0.74    Recent Results (from the past 240 hour(s))  SARS Coronavirus 2 [facility] order, Performed in [facility] hospital lab) Nasopharyngeal Urine, Random     Status: None   Collection Time: 11/06/18  4:38 PM   Specimen: Urine,  Random; Nasopharyngeal  Result Value Ref Range Status   SARS Coronavirus 2 NEGATIVE NEGATIVE Final    Comment: (NOTE) If result is NEGATIVE SARS-CoV-2 target nucleic acids are NOT DETECTED. The SARS-CoV-2 RNA is generally detectable in upper and lower  respiratory specimens during the acute phase of infection. The lowest  concentration of SARS-CoV-2 viral copies this assay can detect is 250  copies / mL. A negative result does not preclude SARS-CoV-2 infection  and should not be used as the sole basis for treatment or other  patient management decisions.  A negative result may occur with  improper specimen collection / handling, submission of specimen other  than nasopharyngeal swab, presence of viral mutation(s) within the  areas targeted by this assay, and inadequate number of viral copies  (<250 copies / mL). A negative result must be combined with clinical  observations, patient history, and epidemiological information. If result is POSITIVE SARS-CoV-2 target nucleic acids are DETECTED. The SARS-CoV-2 RNA is generally detectable in upper and lower  respiratory specimens dur ing the acute phase of infection.  Positive  results are indicative of active infection with SARS-CoV-2.  Clinical  correlation with patient history and other diagnostic information is  necessary to determine patient infection status.  Positive results do  not rule out bacterial infection or co-infection with other viruses. If result is PRESUMPTIVE POSTIVE SARS-CoV-2 nucleic acids MAY BE PRESENT.   A presumptive positive result was obtained on the submitted specimen  and confirmed on repeat testing.  While 2019 novel coronavirus  (SARS-CoV-2) nucleic acids may be present in the submitted sample  additional confirmatory testing may be necessary for epidemiological  and / or clinical management purposes  to differentiate between  SARS-CoV-2 and other Sarbecovirus currently known to infect humans.  If clinically  indicated additional testing with an alternate test  methodology [phone]) is advised. The SARS-CoV-2 RNA is generally  detectable in upper and lower respiratory sp ecimens during the acute  phase of infection. The expected result is Negative. Fact Sheet for Patients:  StrictlyIdeas.no Fact Sheet for Healthcare Providers: BankingDealers.co.za This test is not yet approved or cleared by the [place] FDA and has been authorized for detection and/or diagnosis of SARS-CoV-2 by FDA under an Emergency Use Authorization (EUA).  This EUA will remain in effect (meaning this test can be used) for the duration of the COVID-19 declaration under Section 564(b)(1) of the Act, 21 U.S.C. section 360bbb-3(b)(1), unless the authorization is terminated or revoked sooner. Performed at [facility], [address] [place], [place] [address]   MRSA PCR Screening     Status: None   Collection Time: 11/06/18 11:00 PM   Specimen: Nasal Mucosa; Nasopharyngeal  Result Value Ref Range Status   MRSA by PCR NEGATIVE NEGATIVE Final    Comment:        The GeneXpert MRSA Assay (FDA approved for NASAL specimens only), is one component of a comprehensive MRSA colonization surveillance program. It is not intended to diagnose MRSA infection nor to guide or monitor treatment for MRSA infections. Performed at [facility], [address], [place], [place] [address]          Radiology Studies: Ct Head Wo Contrast  Result Date: 11/06/2018 CLINICAL DATA:  Altered level of consciousness EXAM: CT HEAD WITHOUT CONTRAST TECHNIQUE: Contiguous axial images were obtained from the base of the skull through the vertex without intravenous contrast. COMPARISON:  10/26/2018 FINDINGS: Brain: No acute intracranial abnormality. Specifically, no hemorrhage, hydrocephalus, mass lesion, acute infarction, or significant intracranial injury. Vascular: No hyperdense vessel or  unexpected calcification. Skull: No acute calvarial abnormality. Sinuses/Orbits: Visualized paranasal sinuses and mastoids clear. Orbital soft tissues unremarkable. Other: None IMPRESSION: No acute intracranial abnormality. Electronically Signed   By: Rolm Baptise M.D.   On: 11/06/2018 18:45   Dg Chest Port 1 View  Result Date: 11/06/2018 CLINICAL DATA:  Shortness of breath EXAM: PORTABLE CHEST 1 VIEW COMPARISON:  October 23, 2018 FINDINGS: There is persistent airspace opacity in the left base. There is atelectatic change in the right base. Heart remains enlarged with mild pulmonary venous hypertension. No adenopathy. No bone lesions. IMPRESSION: Persistent airspace consolidation consistent  with pneumonia left base. Atelectasis right base. Underlying cardiomegaly with a degree of pulmonary vascular congestion. Electronically Signed   By: Lowella Grip III M.D.   On: 11/06/2018 17:08        Scheduled Meds:  amiodarone  200 mg Oral Daily   Chlorhexidine Gluconate Cloth  6 each Topical Daily   docusate sodium  100 mg Oral Daily   fluticasone furoate-vilanterol  1 puff Inhalation Daily   And   umeclidinium bromide  1 puff Inhalation Daily   furosemide  40 mg Intravenous Q12H   insulin aspart  0-9 Units Subcutaneous TID WC   metoprolol tartrate  50 mg Oral BID   QUEtiapine  25 mg Oral QHS   rivaroxaban  15 mg Oral Q supper   sodium zirconium cyclosilicate  10 g Oral Once   Continuous Infusions:   LOS: 2 days    Time spent: 30 minutes    Angelik Walls Darleen Crocker, DO [facility] Pager [phone]  If 7PM-7AM, please contact night-coverage [email or web address] Password [facility] 11/08/2018, 6:59 AM

## 2018-11-09 LAB — BASIC METABOLIC PANEL
Anion gap: >20 — ABNORMAL HIGH (ref 5–15)
BUN: 16 mg/dL (ref 8–23)
CO2: 36 mmol/L — ABNORMAL HIGH (ref 22–32)
Calcium: 9.5 mg/dL (ref 8.9–10.3)
Chloride: 85 mmol/L — ABNORMAL LOW (ref 98–111)
Creatinine, Ser: 0.83 mg/dL (ref 0.44–1.00)
GFR calc Af Amer: >60 mL/min (ref >60)
GFR calc non Af Amer: >60 mL/min (ref >60)
Glucose, Bld: 67 mg/dL — ABNORMAL LOW (ref 70–99)
Potassium: 4.1 mmol/L (ref 3.5–5.1)
Sodium: 142 mmol/L (ref 135–145)

## 2018-11-09 LAB — BLOOD GAS, ARTERIAL
Acid-Base Excess: 20 mmol/L — ABNORMAL HIGH (ref 0.0–2.0)
Bicarbonate: 41.9 mmol/L — ABNORMAL HIGH (ref 20.0–28.0)
FIO2: 36
O2 Saturation: 83.2 %
Patient temperature: 37
pCO2 arterial: 79.9 mmHg (ref 32.0–48.0)
pH, Arterial: 7.382 (ref 7.350–7.450)
pO2, Arterial: 53.9 mmHg — ABNORMAL LOW (ref 83.0–108.0)

## 2018-11-09 LAB — CBC
HCT: 42.5 % (ref 36.0–46.0)
Hemoglobin: 11.9 g/dL — ABNORMAL LOW (ref 12.0–15.0)
MCH: 28.1 pg (ref 26.0–34.0)
MCHC: 28 g/dL — ABNORMAL LOW (ref 30.0–36.0)
MCV: 100.5 fL — ABNORMAL HIGH (ref 80.0–100.0)
Platelets: 257 10*3/uL (ref 150–400)
RBC: 4.23 MIL/uL (ref 3.87–5.11)
RDW: 14.5 % (ref 11.5–15.5)
WBC: 10.9 10*3/uL — ABNORMAL HIGH (ref 4.0–10.5)
nRBC: 0 % (ref 0.0–0.2)

## 2018-11-09 LAB — GLUCOSE, CAPILLARY
Glucose-Capillary: 80 mg/dL (ref 70–99)
Glucose-Capillary: 223 mg/dL — ABNORMAL HIGH (ref 70–99)
Glucose-Capillary: 84 mg/dL (ref 70–99)
Glucose-Capillary: 185 mg/dL — ABNORMAL HIGH (ref 70–99)

## 2018-11-09 MED ORDER — IPRATROPIUM BROMIDE 0.02 % IN SOLN: 0.5000 mg | RESPIRATORY_TRACT | Status: DC

## 2018-11-09 MED ORDER — ACETAMINOPHEN 325 MG PO TABS
650.0000 mg | ORAL_TABLET | Freq: Four times a day (QID) | ORAL | Status: DC | PRN
  Administered 2018-11-09 – 2018-11-11 (×5): 650 mg via ORAL
  Filled 2018-11-09 (×5): qty 2

## 2018-11-09 MED ORDER — QUETIAPINE FUMARATE 25 MG PO TABS
25.0000 mg | ORAL_TABLET | Freq: Every day | ORAL | Status: DC
  Administered 2018-11-09 – 2018-11-10 (×2): 25 mg via ORAL
  Filled 2018-11-09 (×3): qty 1

## 2018-11-09 MED ORDER — SODIUM CHLORIDE 0.9 % IV BOLUS
1000.0000 mL | Freq: Once | INTRAVENOUS | Status: AC
  Administered 2018-11-10: 1000 mL via INTRAVENOUS

## 2018-11-09 MED ORDER — IPRATROPIUM BROMIDE 0.02 % IN SOLN
0.5000 mg | Freq: Four times a day (QID) | RESPIRATORY_TRACT | Status: DC | PRN
  Administered 2018-11-10: 08:00:00 0.5 mg via RESPIRATORY_TRACT
  Filled 2018-11-09 (×2): qty 2.5

## 2018-11-09 MED ORDER — IPRATROPIUM BROMIDE 0.02 % IN SOLN
RESPIRATORY_TRACT | Status: AC
  Administered 2018-11-09: 0.5 mg
  Filled 2018-11-09: qty 2.5

## 2018-11-09 NOTE — Progress Notes (Signed)
CRITICAL VALUE ALERT  Critical Value:  PCO2 79.9  Date & Time Notied:  8/29 @ 0530  Provider Notified: Zierle-Ghosh  Orders Received/Actions taken: no new orders

## 2018-11-09 NOTE — Progress Notes (Signed)
PROGRESS NOTE    [name]  [id number] DOB: 04-Jun-1945 DOA: 11/06/2018 PCP: Lavella Lemons, PA   Brief Narrative:  Per HPI: [name],with history of right breast cancer in remission, L1 compression fracture status post kyphoplasty, iron deficiency anemia, diverticulosis, diabetes mellitus type 2, atrial flutter, moderate mitral regurgitation (E echo 06-27-2026), COPD, chronic respiratory failure on 3.5 L at baseline presents to the ER today because she does not feel well. Patient has dementia and is not able to offer reliable history. She is alert and oriented x2-to self and place,but she is not able to accurately identify her daughter at bedside. History is taken from chart review, daughter at bedside, and primary caregiver Eileen [name] (another daughter) whose phone number is [phone].  Patiently recently admitted on October 24, 2018 for COPD exacerbation. At that time she was started on vancomycin, cefepime, and had MRSA screening, urine Legionella antigen, and urine strep antigen,as well as 2 sets of blood cultures. At that time blood cultures had no growth at 5 days. MRSA screen by PCR has been consistently negative on October 24, 2018, October 19, 2018, September 26, 2018, Aug 10, 2018, etc. Urinary antigen for strep pneumo has been negative consistently as well over the last 3 years. Urine Legionella was negative at that admission as well. Unfortunately I am unable to locate the sputum culture.  Today, daughter Eileen [name], reports that patient requested to call ambulance to come into the ED due to not feeling well. Daughter does report the patient has had decreased appetite over the past 2 days. Although she has eaten it has not been her normal amount. Patient has not had any vomiting diarrhea or foul-smelling urine. Patient has not had a cough more than her normal. She has not required an increase in her oxygen supplementation. Daughter thinks that this  complaint is secondary to dementia. She reports that the patient gets much worse every evening. And it was not until evening that the patient started saying that she did not feel well and wanted to go to the hospital.Daughter reports that she tried to get patient to describe which symptoms she was experiencing. Patient just repeated over and over that she was not feeling well. Patient herself is not able to contribute to review of systems due to dementia. At the moment of the exam patient reports that her breathing feels so/so. She reports that sometimes it feels good, but most the time it feels so-so.  8/27:Patient was admitted with recurrence of pneumonia thought to be HCAP. She is noted to have worsening symptoms of dementia that seem to exacerbate in the evenings. She does not appear to be taking her home medications as prescribed. She has been given 2 doses of Ativan overnight and is quite encephalopathic this morning and cannot be aroused by tactile stimulation. She was noted to have significant hypercapnia and was transferred to stepdown unit and placed on BiPAP with improvement in PCO2 levels noted thus far.  8/28: Patient more alert and awake this am, but continues to have persistent hypercarbia on ABG. She appears to be diuresing well with lasix and potassium levels have improved. Will switch azithromycin and Rocephin to Zosyn for aspiration coverage. SLP evaluation ordered and pending. Appreciate Pulmonology evaluation regarding hypercarbia and recurrent pneumonia. Seroquel to be initiated tonight.  8/29: Patient is doing much better this morning, unfortunately had periods of significant hallucinations and agitation yesterday evening.  She responds well to Seroquel and temporarily needed to be on restraints.  She  is now off BiPAP and on nasal cannula oxygen at 3.5 L and appears to be overall doing much better.  SLP evaluation does not reveal any swallowing issues and brain MRI with no  acute findings.  It appears she might require Seroquel at an earlier time to help prevent significant symptomatology.  We will plan to continue Zosyn for now as it still appears that she may have aspirated.  Assessment & Plan:   Active Problems:   HCAP (healthcare-associated pneumonia)   Chronic obstructive bronchitis (HCC)   Dementia with behavioral disturbance (HCC)   Goals of care, counseling/discussion   Advanced care planning/counseling discussion   Palliative care by specialist   Recurrent pneumoniapossibly related to aspiration -Procalcitonin elevated -Continue IV Zosyn for now and plan to switch to Augmentin on discharge -SLP evaluation with no swallowing issues noted -Brain MRI performed with no acute findings  Acute hypercapnic encephalopathy secondary to sedation from Ativan in setting of COPD -Elevated pCO2 minimal improving on repeat ABG, no further need for BiPAP -Pulmonology consulted and appreciate evaluation -Repeat ABG am and see if pCO2 stable without bipap support; may need to consider sleep evaluation outpatient  History of dementia with sundowning along with hallucinations/agitation -Patient taken off Seroquel in the outpatient setting, but will continue now at 1700 -Olanzapine could not be given given Amiodarone reaction -Please avoid lorazepam -Appreciate palliative care evaluation -We will need neurology follow-up outpatient regarding dementia -Brain MRI with no acute findings  Hyperkalemia/mild volume overload-improved -Patient given [facility] 8/26 -Continue IV Lasix bid for now with good UO noted -Repeat a.m. labs and monitor on telemetry  Thyromegaly -TSH stable with last drawn on 10/26/2018  Type 2 diabetes with hyperglycemia -Recent hemoglobin A1c 8.5% on 7/20 -Continue management with SSI  History of atrial flutter-currently in SR -Rate controlled -Continue amiodarone, metoprolol, and Xarelto -Okay for transfer to telemetry  DVT  prophylaxis:Xarelto Code Status:Full Family Communication:Discussed with daughter Eileen [name] at bedside on 8/27 Disposition Plan:Continue treatment for suspected aspiration pneumonia with IV Zosyn.  Recheck labs and ABG in a.m.  Transfer to telemetry and maintain on nasal cannula oxygen.  Seroquel to be given at 1700 now.   Consultants:  Palliative  Pulmonology  Procedures:  None  Antimicrobials:  Anti-infectives (From admission, onward)   Start     Dose/Rate Route Frequency Ordered Stop   11/08/18 0730  piperacillin-tazobactam (ZOSYN) IVPB 3.375 g     3.375 g 12.5 mL/hr over 240 Minutes Intravenous Every 8 hours 11/08/18 0719     11/06/18 2300  cefTRIAXone (ROCEPHIN) 2 g in sodium chloride 0.9 % 100 mL IVPB  Status:  Discontinued     2 g 200 mL/hr over 30 Minutes Intravenous Every 24 hours 11/06/18 2258 11/08/18 0658   11/06/18 2300  azithromycin (ZITHROMAX) 500 mg in sodium chloride 0.9 % 250 mL IVPB  Status:  Discontinued     500 mg 250 mL/hr over 60 Minutes Intravenous Every 24 hours 11/06/18 2258 11/08/18 0658   11/06/18 2200  ceFEPIme (MAXIPIME) 2 g in sodium chloride 0.9 % 100 mL IVPB  Status:  Discontinued     2 g 200 mL/hr over 30 Minutes Intravenous Every 12 hours 11/06/18 2039 11/06/18 2141   11/06/18 2045  Vancomycin (VANCOCIN) 1,500 mg in sodium chloride 0.9 % 500 mL IVPB  Status:  Discontinued     1,500 mg 250 mL/hr over 120 Minutes Intravenous  Once 11/06/18 2039 11/06/18 2141       Subjective: Patient seen and evaluated today  with no new acute complaints or concerns. No acute concerns or events noted overnight.  She did have some significant agitation and hallucinations yesterday evening which responded well to Seroquel, unfortunately she did have to be in restraints for a short period of time.  She is currently off BiPAP and is doing well on nasal cannula this morning.  Objective: Vitals:   11/09/18 0500 11/09/18 0600 11/09/18 0700 11/09/18 0749  BP:  (!) 101/56 101/68 116/71   Pulse: 84 86 84 (!) 115  Resp: (!) 24 20 (!) 26 (!) 23  Temp:    97.9 F (36.6 C)  TempSrc:    Oral  SpO2: 94% 90% 94% 98%  Weight:      Height:        Intake/Output Summary (Last 24 hours) at 11/09/2018 0755 Last data filed at 11/09/2018 0400 Gross per 24 hour  Intake 115.95 ml  Output 650 ml  Net -534.05 ml   Filed Weights   11/06/18 2312 11/07/18 1100 11/08/18 0500  Weight: 74.6 kg 71.1 kg 69.8 kg    Examination:  General exam: Appears calm and comfortable  Respiratory system: Clear to auscultation. Respiratory effort normal.  Nasal cannula 3.5 L. Cardiovascular system: S1 & S2 heard, irregular. No JVD, murmurs, rubs, gallops or clicks. No pedal edema. Gastrointestinal system: Abdomen is nondistended, soft and nontender. No organomegaly or masses felt. Normal bowel sounds heard. Central nervous system: Alert and awake Extremities: SCDs present bilaterally with no significant edema Skin: No rashes, lesions or ulcers Psychiatry: Cannot be fully evaluated.    Data Reviewed: I have personally reviewed following labs and imaging studies  CBC: Recent Labs  Lab 11/06/18 1623 11/07/18 0638 11/08/18 0448 11/09/18 0431  WBC 14.9* 11.6* 13.5* 10.9*  NEUTROABS 12.3*  --   --   --   HGB 10.7* 10.7* 10.8* 11.9*  HCT 39.0 39.2 37.2 42.5  MCV 101.6* 103.2* 97.4 100.5*  PLT 307 288 203 [id number]   Basic Metabolic Panel: Recent Labs  Lab 11/06/18 1623 11/06/18 1648 11/07/18 0638 11/08/18 0448 11/09/18 0431  NA 138  --  142 143 142  K 5.0  --  5.9* 5.0 4.1  CL 91*  --  94* 91* 85*  CO2 37*  --  36* 38* 36*  GLUCOSE 180*  --  218* 101* 67*  BUN 14  --  19 19 16   CREATININE 0.80  --  1.05* 0.82 0.83  CALCIUM 9.3  --  9.1 8.8* 9.5  MG  --  2.0  --   --   --   PHOS  --  3.7  --   --   --    GFR: Estimated Creatinine Clearance: 55.3 mL/min (by C-G formula based on SCr of 0.83 mg/dL). Liver Function Tests: Recent Labs  Lab 11/06/18 1623  11/07/18 0638  AST 24 82*  ALT 26 70*  ALKPHOS 89 103  BILITOT 0.6 0.7  PROT 6.8 6.8  ALBUMIN 3.8 3.6   No results for input(s): LIPASE, AMYLASE in the last 168 hours. No results for input(s): AMMONIA in the last 168 hours. Coagulation Profile: No results for input(s): INR, PROTIME in the last 168 hours. Cardiac Enzymes: No results for input(s): CKTOTAL, CKMB, CKMBINDEX, TROPONINI in the last 168 hours. BNP (last 3 results) No results for input(s): PROBNP in the last 8760 hours. HbA1C: No results for input(s): HGBA1C in the last 72 hours. CBG: Recent Labs  Lab 11/08/18 0811 11/08/18 1045 11/08/18 1559 11/08/18 2215 11/09/18  [place] 80   Lipid Profile: No results for input(s): CHOL, HDL, LDLCALC, TRIG, CHOLHDL, LDLDIRECT in the last 72 hours. Thyroid Function Tests: No results for input(s): TSH, T4TOTAL, FREET4, T3FREE, THYROIDAB in the last 72 hours. Anemia Panel: No results for input(s): VITAMINB12, FOLATE, FERRITIN, TIBC, IRON, RETICCTPCT in the last 72 hours. Sepsis Labs: Recent Labs  Lab 11/07/18 1528  PROCALCITON 0.74    Recent Results (from the past 240 hour(s))  SARS Coronavirus 2 [facility] order, Performed in [facility] hospital lab) Nasopharyngeal Urine, Random     Status: None   Collection Time: 11/06/18  4:38 PM   Specimen: Urine, Random; Nasopharyngeal  Result Value Ref Range Status   SARS Coronavirus 2 NEGATIVE NEGATIVE Final    Comment: (NOTE) If result is NEGATIVE SARS-CoV-2 target nucleic acids are NOT DETECTED. The SARS-CoV-2 RNA is generally detectable in upper and lower  respiratory specimens during the acute phase of infection. The lowest  concentration of SARS-CoV-2 viral copies this assay can detect is 250  copies / mL. A negative result does not preclude SARS-CoV-2 infection  and should not be used as the sole basis for treatment or other  patient management decisions.  A negative result may occur with  improper  specimen collection / handling, submission of specimen other  than nasopharyngeal swab, presence of viral mutation(s) within the  areas targeted by this assay, and inadequate number of viral copies  (<250 copies / mL). A negative result must be combined with clinical  observations, patient history, and epidemiological information. If result is POSITIVE SARS-CoV-2 target nucleic acids are DETECTED. The SARS-CoV-2 RNA is generally detectable in upper and lower  respiratory specimens dur ing the acute phase of infection.  Positive  results are indicative of active infection with SARS-CoV-2.  Clinical  correlation with patient history and other diagnostic information is  necessary to determine patient infection status.  Positive results do  not rule out bacterial infection or co-infection with other viruses. If result is PRESUMPTIVE POSTIVE SARS-CoV-2 nucleic acids MAY BE PRESENT.   A presumptive positive result was obtained on the submitted specimen  and confirmed on repeat testing.  While 2019 novel coronavirus  (SARS-CoV-2) nucleic acids may be present in the submitted sample  additional confirmatory testing may be necessary for epidemiological  and / or clinical management purposes  to differentiate between  SARS-CoV-2 and other Sarbecovirus currently known to infect humans.  If clinically indicated additional testing with an alternate test  methodology [phone]) is advised. The SARS-CoV-2 RNA is generally  detectable in upper and lower respiratory sp ecimens during the acute  phase of infection. The expected result is Negative. Fact Sheet for Patients:  StrictlyIdeas.no Fact Sheet for Healthcare Providers: BankingDealers.co.za This test is not yet approved or cleared by the [place] FDA and has been authorized for detection and/or diagnosis of SARS-CoV-2 by FDA under an Emergency Use Authorization (EUA).  This EUA will remain in  effect (meaning this test can be used) for the duration of the COVID-19 declaration under Section 564(b)(1) of the Act, 21 U.S.C. section 360bbb-3(b)(1), unless the authorization is terminated or revoked sooner. Performed at [facility], [address] [place], [place] [address]   MRSA PCR Screening     Status: None   Collection Time: 11/06/18 11:00 PM   Specimen: Nasal Mucosa; Nasopharyngeal  Result Value Ref Range Status   MRSA by PCR NEGATIVE NEGATIVE Final    Comment:  The GeneXpert MRSA Assay (FDA approved for NASAL specimens only), is one component of a comprehensive MRSA colonization surveillance program. It is not intended to diagnose MRSA infection nor to guide or monitor treatment for MRSA infections. Performed at [facility], [address], [place], [place] [address]          Radiology Studies: Mr Brain 36 Contrast  Result Date: 11/08/2018 CLINICAL DATA:  73 year old female with unexplained altered mental status for 2 days. Encephalopathy. EXAM: MRI HEAD WITHOUT CONTRAST TECHNIQUE: Multiplanar, multiecho pulse sequences of the brain and surrounding structures were obtained without intravenous contrast. COMPARISON:  Head CTs 11/06/2018 and earlier. FINDINGS: Brain: No restricted diffusion to suggest acute infarction. No midline shift, mass effect, evidence of mass lesion, ventriculomegaly, extra-axial collection or acute intracranial hemorrhage. Cervicomedullary junction and pituitary are within normal limits. Axial T2 images are suboptimal due to motion, and the patient discontinued the exam before coronal T2 images could be obtained. However, gray and white matter signal is largely normal for age throughout the brain. There is mild nonspecific patchy mostly periventricular white matter T2 and FLAIR hyperintensity. No cortical encephalomalacia or chronic blood products identified. The deep gray nuclei, brainstem and cerebellum appear negative. Vascular: Major  intracranial vascular flow voids are preserved. Skull and upper cervical spine: Negative visible cervical spine. Visualized bone marrow signal is within normal limits. Sinuses/Orbits: Motion artifact at the orbits which appear grossly negative. Paranasal sinuses are stable and well pneumatized. Other: There is a mild right mastoid effusion redemonstrated. Negative nasopharynx. Left mastoids are clear. Scalp and face soft tissues appear negative. IMPRESSION: 1. No acute intracranial abnormality, and largely negative for age non-contrast MRI appearance of the brain allowing for some motion degradation. 2. Mild right mastoid effusion, likely postinflammatory. Electronically Signed   By: Genevie Ann M.D.   On: 11/08/2018 15:32        Scheduled Meds:  amiodarone  200 mg Oral Daily   Chlorhexidine Gluconate Cloth  6 each Topical Daily   docusate sodium  100 mg Oral Daily   fluticasone furoate-vilanterol  1 puff Inhalation Daily   And   umeclidinium bromide  1 puff Inhalation Daily   furosemide  40 mg Intravenous Q12H   insulin aspart  0-9 Units Subcutaneous TID WC   metoprolol tartrate  50 mg Oral BID   QUEtiapine  25 mg Oral QHS   rivaroxaban  20 mg Oral Q supper   sodium zirconium cyclosilicate  10 g Oral Once   Continuous Infusions:  piperacillin-tazobactam (ZOSYN)  IV 3.375 g (11/09/18 0542)     LOS: 3 days    Time spent: 30 minutes    France Noyce Darleen Crocker, DO [facility] Pager [phone]  If 7PM-7AM, please contact night-coverage [email or web address] Password [facility] 11/09/2018, 7:55 AM

## 2018-11-09 NOTE — Progress Notes (Signed)
Subjective: She looks substantially better this morning.  She does not have any new complaints.  Her breathing is doing better.  She is on nasal oxygen now.  Objective: Vital signs in last 24 hours: Temp:  [97.4 F (36.3 C)-98 F (36.7 C)] 97.9 F (36.6 C) (08/29 0749) Pulse Rate:  [35-167] 103 (08/29 0800) Resp:  [15-29] 19 (08/29 0800) BP: (94-119)/(55-82) 99/66 (08/29 0800) SpO2:  [75 %-100 %] 95 % (08/29 0800) FiO2 (%):  [32 %-40 %] 32 % (08/28 2053) Weight change:  Last BM Date: (Unknown)  Intake/Output from previous day: 08/28 0701 - 08/29 0700 In: 116 [IV Piggyback:116] Out: 650 [Urine:650]  PHYSICAL EXAM General appearance: alert and no distress Resp: rhonchi bilaterally Cardio: regular rate and rhythm, S1, S2 normal, no murmur, click, rub or gallop GI: soft, non-tender; bowel sounds normal; no masses,  no organomegaly Extremities: extremities normal, atraumatic, no cyanosis or edema  Lab Results:  Results for orders placed or performed during the hospital encounter of 11/06/18 (from the past 48 hour(s))  Blood gas, arterial     Status: Abnormal   Collection Time: 11/07/18 10:15 AM  Result Value Ref Range   FIO2 32.00    pH, Arterial 7.042 (LL) 7.350 - 7.450    Comment: CRITICAL RESULT CALLED TO, READ BACK BY AND VERIFIED WITH: TAYLOR,M AT 10:25AM ON 11/07/18 BY FESTERMAN,C    pCO2 arterial >120.0 (HH) 32.0 - 48.0 mmHg    Comment: CRITICAL RESULT CALLED TO, READ BACK BY AND VERIFIED WITH: TAYLOR,M AT 10:25AM ON 11/07/18 BY FESTERMAN,C    pO2, Arterial 74.9 (L) 83.0 - 108.0 mmHg   O2 Saturation 87.5 %   Patient temperature 37.5    Allens test (pass/fail) BRACHIAL ARTERY (A) PASS    Comment: Performed at [facility], [address], [place], [place] [address]  Glucose, capillary     Status: Abnormal   Collection Time: 11/07/18 11:15 AM  Result Value Ref Range   Glucose-Capillary 184 (H) 70 - 99 mg/dL  Blood gas, arterial     Status: Abnormal   Collection  Time: 11/07/18 12:35 PM  Result Value Ref Range   FIO2 40.00    pH, Arterial 7.251 (L) 7.350 - 7.450   pCO2 arterial 95.5 (HH) 32.0 - 48.0 mmHg    Comment: CRITICAL RESULT CALLED TO, READ BACK BY AND VERIFIED WITH: SMITH,J@1249  BY MATTHEWS, B 8.27.2020    pO2, Arterial 58.2 (L) 83.0 - 108.0 mmHg   Bicarbonate 34.3 (H) 20.0 - 28.0 mmol/L   Acid-Base Excess 13.1 (H) 0.0 - 2.0 mmol/L   O2 Saturation 88.2 %   Patient temperature 36.3    Allens test (pass/fail) PASS PASS    Comment: Performed at [facility], [address] [place], [place] [address]  Procalcitonin - Baseline     Status: None   Collection Time: 11/07/18  3:28 PM  Result Value Ref Range   Procalcitonin 0.74 ng/mL    Comment:        Interpretation: PCT > 0.5 ng/mL and <= 2 ng/mL: Systemic infection (sepsis) is possible, but other conditions are known to elevate PCT as well. (NOTE)       Sepsis PCT Algorithm           Lower Respiratory Tract                                      Infection PCT Algorithm    ----------------------------     ----------------------------  PCT < 0.25 ng/mL                PCT < 0.10 ng/mL         Strongly encourage             Strongly discourage   discontinuation of antibiotics    initiation of antibiotics    ----------------------------     -----------------------------       PCT 0.25 - 0.50 ng/mL            PCT 0.10 - 0.25 ng/mL               OR       >80% decrease in PCT            Discourage initiation of                                            antibiotics      Encourage discontinuation           of antibiotics    ----------------------------     -----------------------------         PCT >= 0.50 ng/mL              PCT 0.26 - 0.50 ng/mL                AND       <80% decrease in PCT             Encourage initiation of                                             antibiotics       Encourage continuation           of antibiotics    ----------------------------      -----------------------------        PCT >= 0.50 ng/mL                  PCT > 0.50 ng/mL               AND         increase in PCT                  Strongly encourage                                      initiation of antibiotics    Strongly encourage escalation           of antibiotics                                     -----------------------------                                           PCT <= 0.25 ng/mL  OR                                        > 80% decrease in PCT                                     Discontinue / Do not initiate                                             antibiotics Performed at [facility], [address], [place], [place] [address]   Glucose, capillary     Status: Abnormal   Collection Time: 11/07/18  4:03 PM  Result Value Ref Range   Glucose-Capillary 105 (H) 70 - 99 mg/dL  Glucose, capillary     Status: None   Collection Time: 11/07/18  9:08 PM  Result Value Ref Range   Glucose-Capillary 96 70 - 99 mg/dL  Basic metabolic panel     Status: Abnormal   Collection Time: 11/08/18  4:48 AM  Result Value Ref Range   Sodium 143 135 - 145 mmol/L   Potassium 5.0 3.5 - 5.1 mmol/L   Chloride 91 (L) 98 - 111 mmol/L   CO2 38 (H) 22 - 32 mmol/L   Glucose, Bld 101 (H) 70 - 99 mg/dL   BUN 19 8 - 23 mg/dL   Creatinine, Ser 0.82 0.44 - 1.00 mg/dL   Calcium 8.8 (L) 8.9 - 10.3 mg/dL   GFR calc non Af Amer >60 >60 mL/min   GFR calc Af Amer >60 >60 mL/min   Anion gap 14 5 - 15    Comment: Performed at [facility], [address], [place], [place] [address]  CBC     Status: Abnormal   Collection Time: 11/08/18  4:48 AM  Result Value Ref Range   WBC 13.5 (H) 4.0 - 10.5 K/uL   RBC 3.82 (L) 3.87 - 5.11 MIL/uL   Hemoglobin 10.8 (L) 12.0 - 15.0 g/dL   HCT 37.2 36.0 - 46.0 %   MCV 97.4 80.0 - 100.0 fL   MCH 28.3 26.0 - 34.0 pg   MCHC 29.0 (L) 30.0 - 36.0 g/dL   RDW 14.5 11.5 - 15.5 %   Platelets 203 150 - 400 K/uL     Comment: PLATELET COUNT CONFIRMED BY SMEAR SPECIMEN CHECKED FOR CLOTS    nRBC 0.0 0.0 - 0.2 %    Comment: Performed at [facility], [address] [place], [place] [address]  Blood gas, arterial     Status: Abnormal   Collection Time: 11/08/18  5:00 AM  Result Value Ref Range   FIO2 45.00    pH, Arterial 7.315 (L) 7.350 - 7.450   pCO2 arterial 93.0 (HH) 32.0 - 48.0 mmHg    Comment: CRITICAL RESULT CALLED TO, READ BACK BY AND VERIFIED WITH: WAGONER,R AT 5:40AM ON 11/08/18 BY FESTERMAN,C    pO2, Arterial 76.4 (L) 83.0 - 108.0 mmHg   Bicarbonate 40.7 (H) 20.0 - 28.0 mmol/L   Acid-Base Excess 18.9 (H) 0.0 - 2.0 mmol/L   O2 Saturation 93.6 %   Patient temperature 37.0    Allens test (pass/fail) BRACHIAL ARTERY (A) PASS    Comment: Performed at [facility], [address], [place], [place] [address]  Glucose, capillary     Status: Abnormal   Collection Time: 11/08/18  8:11 AM  Result Value Ref Range   Glucose-Capillary 105 (H) 70 - 99 mg/dL  Glucose, capillary     Status: Abnormal   Collection Time: 11/08/18 10:45 AM  Result Value Ref Range   Glucose-Capillary 112 (H) 70 - 99 mg/dL  Glucose, capillary     Status: Abnormal   Collection Time: 11/08/18  3:59 PM  Result Value Ref Range   Glucose-Capillary 157 (H) 70 - 99 mg/dL  Glucose, capillary     Status: Abnormal   Collection Time: 11/08/18 10:15 PM  Result Value Ref Range   Glucose-Capillary 101 (H) 70 - 99 mg/dL  CBC     Status: Abnormal   Collection Time: 11/09/18  4:31 AM  Result Value Ref Range   WBC 10.9 (H) 4.0 - 10.5 K/uL   RBC 4.23 3.87 - 5.11 MIL/uL   Hemoglobin 11.9 (L) 12.0 - 15.0 g/dL   HCT 42.5 36.0 - 46.0 %   MCV 100.5 (H) 80.0 - 100.0 fL   MCH 28.1 26.0 - 34.0 pg   MCHC 28.0 (L) 30.0 - 36.0 g/dL   RDW 14.5 11.5 - 15.5 %   Platelets 257 150 - 400 K/uL   nRBC 0.0 0.0 - 0.2 %    Comment: Performed at [facility], [address] [place], [place] [id number]  Basic metabolic panel     Status: Abnormal    Collection Time: 11/09/18  4:31 AM  Result Value Ref Range   Sodium 142 135 - 145 mmol/L   Potassium 4.1 3.5 - 5.1 mmol/L    Comment: DELTA CHECK NOTED   Chloride 85 (L) 98 - 111 mmol/L   CO2 36 (H) 22 - 32 mmol/L   Glucose, Bld 67 (L) 70 - 99 mg/dL   BUN 16 8 - 23 mg/dL   Creatinine, Ser 0.83 0.44 - 1.00 mg/dL   Calcium 9.5 8.9 - 10.3 mg/dL   GFR calc non Af Amer >60 >60 mL/min   GFR calc Af Amer >60 >60 mL/min   Anion gap >20 (H) 5 - 15    Comment: Performed at [facility], [address], [place], [place] [address]  Blood gas, arterial     Status: Abnormal   Collection Time: 11/09/18  5:00 AM  Result Value Ref Range   FIO2 36.00    pH, Arterial 7.382 7.350 - 7.450   pCO2 arterial 79.9 (HH) 32.0 - 48.0 mmHg    Comment: CRITICAL RESULT CALLED TO, READ BACK BY AND VERIFIED WITH: WADE,S @ 0526 ON 11/09/18 BY JUW    pO2, Arterial 53.9 (L) 83.0 - 108.0 mmHg   Bicarbonate 41.9 (H) 20.0 - 28.0 mmol/L   Acid-Base Excess 20.0 (H) 0.0 - 2.0 mmol/L   O2 Saturation 83.2 %   Patient temperature 37.0    Allens test (pass/fail) NOT INDICATED (A) PASS    Comment: Performed at [facility], [address] [place] [address] [place],  [address]  Glucose, capillary     Status: None   Collection Time: 11/09/18  7:47 AM  Result Value Ref Range   Glucose-Capillary 80 70 - 99 mg/dL    ABGS Recent Labs    11/09/18 0500  PHART 7.382  PO2ART 53.9*  HCO3 41.9*   CULTURES Recent Results (from the past 240 hour(s))  SARS Coronavirus 2 [facility] order, Performed in [facility] hospital lab) Nasopharyngeal Urine,  Random     Status: None   Collection Time: 11/06/18  4:38 PM   Specimen: Urine, Random; Nasopharyngeal  Result Value Ref Range Status   SARS Coronavirus 2 NEGATIVE NEGATIVE Final    Comment: (NOTE) If result is NEGATIVE SARS-CoV-2 target nucleic acids are NOT DETECTED. The SARS-CoV-2 RNA is generally detectable in upper and lower  respiratory specimens during the acute phase of infection. The  lowest  concentration of SARS-CoV-2 viral copies this assay can detect is 250  copies / mL. A negative result does not preclude SARS-CoV-2 infection  and should not be used as the sole basis for treatment or other  patient management decisions.  A negative result may occur with  improper specimen collection / handling, submission of specimen other  than nasopharyngeal swab, presence of viral mutation(s) within the  areas targeted by this assay, and inadequate number of viral copies  (<250 copies / mL). A negative result must be combined with clinical  observations, patient history, and epidemiological information. If result is POSITIVE SARS-CoV-2 target nucleic acids are DETECTED. The SARS-CoV-2 RNA is generally detectable in upper and lower  respiratory specimens dur ing the acute phase of infection.  Positive  results are indicative of active infection with SARS-CoV-2.  Clinical  correlation with patient history and other diagnostic information is  necessary to determine patient infection status.  Positive results do  not rule out bacterial infection or co-infection with other viruses. If result is PRESUMPTIVE POSTIVE SARS-CoV-2 nucleic acids MAY BE PRESENT.   A presumptive positive result was obtained on the submitted specimen  and confirmed on repeat testing.  While 2019 novel coronavirus  (SARS-CoV-2) nucleic acids may be present in the submitted sample  additional confirmatory testing may be necessary for epidemiological  and / or clinical management purposes  to differentiate between  SARS-CoV-2 and other Sarbecovirus currently known to infect humans.  If clinically indicated additional testing with an alternate test  methodology [phone]) is advised. The SARS-CoV-2 RNA is generally  detectable in upper and lower respiratory sp ecimens during the acute  phase of infection. The expected result is Negative. Fact Sheet for Patients:   StrictlyIdeas.no Fact Sheet for Healthcare Providers: BankingDealers.co.za This test is not yet approved or cleared by the [place] FDA and has been authorized for detection and/or diagnosis of SARS-CoV-2 by FDA under an Emergency Use Authorization (EUA).  This EUA will remain in effect (meaning this test can be used) for the duration of the COVID-19 declaration under Section 564(b)(1) of the Act, 21 U.S.C. section 360bbb-3(b)(1), unless the authorization is terminated or revoked sooner. Performed at [facility], [address] [place], [place] [address]   MRSA PCR Screening     Status: None   Collection Time: 11/06/18 11:00 PM   Specimen: Nasal Mucosa; Nasopharyngeal  Result Value Ref Range Status   MRSA by PCR NEGATIVE NEGATIVE Final    Comment:        The GeneXpert MRSA Assay (FDA approved for NASAL specimens only), is one component of a comprehensive MRSA colonization surveillance program. It is not intended to diagnose MRSA infection nor to guide or monitor treatment for MRSA infections. Performed at [facility], [address] [place], [place] [address]    Studies/Results: Mr Brain 74 Contrast  Result Date: 11/08/2018 CLINICAL DATA:  73 year old female with unexplained altered mental status for 2 days. Encephalopathy. EXAM: MRI HEAD WITHOUT CONTRAST TECHNIQUE: Multiplanar, multiecho pulse sequences of the brain and surrounding structures were obtained without  intravenous contrast. COMPARISON:  Head CTs 11/06/2018 and earlier. FINDINGS: Brain: No restricted diffusion to suggest acute infarction. No midline shift, mass effect, evidence of mass lesion, ventriculomegaly, extra-axial collection or acute intracranial hemorrhage. Cervicomedullary junction and pituitary are within normal limits. Axial T2 images are suboptimal due to motion, and the patient discontinued the exam before coronal T2 images could be obtained. However,  gray and white matter signal is largely normal for age throughout the brain. There is mild nonspecific patchy mostly periventricular white matter T2 and FLAIR hyperintensity. No cortical encephalomalacia or chronic blood products identified. The deep gray nuclei, brainstem and cerebellum appear negative. Vascular: Major intracranial vascular flow voids are preserved. Skull and upper cervical spine: Negative visible cervical spine. Visualized bone marrow signal is within normal limits. Sinuses/Orbits: Motion artifact at the orbits which appear grossly negative. Paranasal sinuses are stable and well pneumatized. Other: There is a mild right mastoid effusion redemonstrated. Negative nasopharynx. Left mastoids are clear. Scalp and face soft tissues appear negative. IMPRESSION: 1. No acute intracranial abnormality, and largely negative for age non-contrast MRI appearance of the brain allowing for some motion degradation. 2. Mild right mastoid effusion, likely postinflammatory. Electronically Signed   By: Genevie Ann M.D.   On: 11/08/2018 15:32    Medications:  Prior to Admission:  Medications Prior to Admission  Medication Sig Dispense Refill Last Dose  . acetaminophen (TYLENOL) 500 MG tablet Take 500 mg by mouth every 8 (eight) hours as needed for mild pain or headache.    11/06/2018 at Unknown time  . albuterol (PROVENTIL) (2.5 MG/3ML) 0.083% nebulizer solution Take 2.5 mg by nebulization every 6 (six) hours as needed for wheezing or shortness of breath.   11/06/2018 at Unknown time  . amiodarone (PACERONE) 200 MG tablet Take 1 tablet (200 mg total) by mouth daily. 90 tablet 3 11/06/2018 at Unknown time  . docusate sodium (COLACE) 100 MG capsule Take 1 capsule (100 mg total) by mouth 2 (two) times daily. (Patient taking differently: Take 100 mg by mouth daily. ) 30 capsule 1 11/06/2018 at Unknown time  . furosemide (LASIX) 40 MG tablet Take 1.5 tablets (60 mg total) by mouth daily. (Patient taking differently: Take  60 mg by mouth every other day. )   11/06/2018 at Unknown time  . guaiFENesin (MUCINEX) 600 MG 12 hr tablet Take 1 tablet (600 mg total) by mouth 2 (two) times daily. 30 tablet 0 Past Month at Unknown time  . metFORMIN (GLUCOPHAGE-XR) 500 MG 24 hr tablet Take 500 mg by mouth daily with breakfast.   1 11/06/2018 at Unknown time  . metoprolol tartrate (LOPRESSOR) 50 MG tablet Take 50 mg by mouth 2 (two) times daily.   11/06/2018 at 0900  . potassium chloride SA (K-DUR) 20 MEQ tablet Take 20 mEq by mouth 2 (two) times daily.   11/06/2018 at Unknown time  . PROAIR HFA 108 (90 BASE) MCG/ACT inhaler Inhale 2 puffs into the lungs every 6 (six) hours as needed for wheezing or shortness of breath.    11/06/2018 at Unknown time  . rivaroxaban (XARELTO) 20 MG TABS tablet Take 1 tablet (20 mg total) by mouth daily with supper. Stop Xarelto until Monday (08/26/18) in anticipation for kyphoplasty 21 tablet 0 11/05/2018 at 1900  . TRELEGY ELLIPTA 100-62.5-25 MCG/INH AEPB Take 1 puff by mouth daily.  0 11/06/2018 at Unknown time  . polyethylene glycol (MIRALAX / GLYCOLAX) 17 g packet Take 17 g by mouth daily as needed for mild constipation. (Patient  not taking: Reported on 10/24/2018) 28 each 0 Unknown at Unknown time   Scheduled: . amiodarone  200 mg Oral Daily  . Chlorhexidine Gluconate Cloth  6 each Topical Daily  . docusate sodium  100 mg Oral Daily  . fluticasone furoate-vilanterol  1 puff Inhalation Daily   And  . umeclidinium bromide  1 puff Inhalation Daily  . furosemide  40 mg Intravenous Q12H  . insulin aspart  0-9 Units Subcutaneous TID WC  . metoprolol tartrate  50 mg Oral BID  . QUEtiapine  25 mg Oral QHS  . rivaroxaban  20 mg Oral Q supper  . sodium zirconium cyclosilicate  10 g Oral Once   Continuous: . piperacillin-tazobactam (ZOSYN)  IV 3.375 g (11/09/18 0542)   [id number], levalbuterol, polyethylene glycol  Assesment: She was admitted with healthcare associated pneumonia and acute on  chronic hypoxic and hypercapnic respiratory failure.  Although her swallowing study was okay there is suspicion that she may have aspirated somewhere along the line.  She has dementia and has significant sundowning and that is being treated  She has COPD at baseline which is pretty severe and she uses home oxygen. Active Problems:   HCAP (healthcare-associated pneumonia)   Chronic obstructive bronchitis (HCC)   Dementia with behavioral disturbance ([place])   Goals of care, counseling/discussion   Advanced care planning/counseling discussion   Palliative care by specialist    Plan: Discussed with Dr. Manuella Ghazi.  She does seem to be improving.  Continue treatments.    LOS: 3 days   Alonza Bogus 11/09/2018, 8:12 AM

## 2018-11-09 NOTE — Progress Notes (Addendum)
Have taken patient Off BiPAP and placed on 5 lpm nasal cannula. She has been taken off restraints as she seems for now to know where she is. She is watching TV. She normally wears CPAP/BiPAP for hs. She appears well recognizes RT from last Visit. Will place back on BiPAP if needed. All her Pulse oximetry on all fingers tends to be Less than optimal ( only reads occassional )

## 2018-11-10 LAB — BASIC METABOLIC PANEL
Anion gap: 15 (ref 5–15)
BUN: 16 mg/dL (ref 8–23)
CO2: 41 mmol/L — ABNORMAL HIGH (ref 22–32)
Calcium: 9 mg/dL (ref 8.9–10.3)
Chloride: 85 mmol/L — ABNORMAL LOW (ref 98–111)
Creatinine, Ser: 0.84 mg/dL (ref 0.44–1.00)
GFR calc Af Amer: >60 mL/min (ref >60)
GFR calc non Af Amer: >60 mL/min (ref >60)
Glucose, Bld: 118 mg/dL — ABNORMAL HIGH (ref 70–99)
Potassium: 3.1 mmol/L — ABNORMAL LOW (ref 3.5–5.1)
Sodium: 141 mmol/L (ref 135–145)

## 2018-11-10 LAB — CBC WITH DIFFERENTIAL/PLATELET
Abs Immature Granulocytes: 0.04 10*3/uL (ref 0.00–0.07)
Basophils Absolute: 0 10*3/uL (ref 0.0–0.1)
Basophils Relative: 0 %
Eosinophils Absolute: 0.1 10*3/uL (ref 0.0–0.5)
Eosinophils Relative: 2 %
HCT: 38.6 % (ref 36.0–46.0)
Hemoglobin: 11 g/dL — ABNORMAL LOW (ref 12.0–15.0)
Immature Granulocytes: 1 %
Lymphocytes Relative: 15 %
Lymphs Abs: 1.2 10*3/uL (ref 0.7–4.0)
MCH: 28.1 pg (ref 26.0–34.0)
MCHC: 28.5 g/dL — ABNORMAL LOW (ref 30.0–36.0)
MCV: 98.5 fL (ref 80.0–100.0)
Monocytes Absolute: 1.1 10*3/uL — ABNORMAL HIGH (ref 0.1–1.0)
Monocytes Relative: 13 %
Neutro Abs: 5.6 10*3/uL (ref 1.7–7.7)
Neutrophils Relative %: 69 %
Platelets: 223 10*3/uL (ref 150–400)
RBC: 3.92 MIL/uL (ref 3.87–5.11)
RDW: 14.4 % (ref 11.5–15.5)
WBC: 8 10*3/uL (ref 4.0–10.5)
nRBC: 0 % (ref 0.0–0.2)

## 2018-11-10 LAB — GLUCOSE, CAPILLARY
Glucose-Capillary: 123 mg/dL — ABNORMAL HIGH (ref 70–99)
Glucose-Capillary: 232 mg/dL — ABNORMAL HIGH (ref 70–99)
Glucose-Capillary: 79 mg/dL (ref 70–99)
Glucose-Capillary: 123 mg/dL — ABNORMAL HIGH (ref 70–99)

## 2018-11-10 LAB — CBC
HCT: 41 % (ref 36.0–46.0)
Hemoglobin: 11.6 g/dL — ABNORMAL LOW (ref 12.0–15.0)
MCH: 28 pg (ref 26.0–34.0)
MCHC: 28.3 g/dL — ABNORMAL LOW (ref 30.0–36.0)
MCV: 98.8 fL (ref 80.0–100.0)
Platelets: 263 10*3/uL (ref 150–400)
RBC: 4.15 MIL/uL (ref 3.87–5.11)
RDW: 14.3 % (ref 11.5–15.5)
WBC: 6.9 10*3/uL (ref 4.0–10.5)
nRBC: 0 % (ref 0.0–0.2)

## 2018-11-10 MED ORDER — METOPROLOL TARTRATE 5 MG/5ML IV SOLN: 5.0000 mg | Freq: Once | INTRAVENOUS | Status: DC

## 2018-11-10 MED ORDER — POTASSIUM CHLORIDE CRYS ER 20 MEQ PO TBCR
40.0000 meq | EXTENDED_RELEASE_TABLET | Freq: Two times a day (BID) | ORAL | Status: AC
  Administered 2018-11-10 (×2): 40 meq via ORAL
  Filled 2018-11-10 (×2): qty 2

## 2018-11-10 MED ORDER — FUROSEMIDE 40 MG PO TABS
60.0000 mg | ORAL_TABLET | Freq: Every day | ORAL | Status: DC
  Administered 2018-11-10: 09:00:00 60 mg via ORAL
  Filled 2018-11-10: qty 1

## 2018-11-10 NOTE — Progress Notes (Signed)
PROGRESS NOTE    [name] Franek  [id number] DOB: Mar 02, 1946 DOA: 11/06/2018 PCP: Lavella Lemons, PA   Brief Narrative:  Per HPI: GeorgiaTuckeris a73 y.o.female,with history of right breast cancer in remission, L1 compression fracture status post kyphoplasty, iron deficiency anemia, diverticulosis, diabetes mellitus type 2, atrial flutter, moderate mitral regurgitation (E echo 06-27-2026), COPD, chronic respiratory failure on 3.5 L at baseline presents to the ER today because she does not feel well. Patient has dementia and is not able to offer reliable history. She is alert and oriented x2-to self and place,but she is not able to accurately identify her daughter at bedside. History is taken from chart review, daughter at bedside, and primary caregiver [name] (another daughter) whose phone number is [phone].  Patiently recently admitted on October 24, 2018 for COPD exacerbation. At that time she was started on vancomycin, cefepime, and had MRSA screening, urine Legionella antigen, and urine strep antigen,as well as 2 sets of blood cultures. At that time blood cultures had no growth at 5 days. MRSA screen by PCR has been consistently negative on October 24, 2018, October 19, 2018, September 26, 2018, Aug 10, 2018, etc. Urinary antigen for strep pneumo has been negative consistently as well over the last 3 years. Urine Legionella was negative at that admission as well. Unfortunately I am unable to locate the sputum culture.  Today, daughter [name], reports that patient requested to call ambulance to come into the ED due to not feeling well. Daughter does report the patient has had decreased appetite over the past 2 days. Although she has eaten it has not been her normal amount. Patient has not had any vomiting diarrhea or foul-smelling urine. Patient has not had a cough more than her normal. She has not required an increase in her oxygen supplementation. Daughter thinks that this  complaint is secondary to dementia. She reports that the patient gets much worse every evening. And it was not until evening that the patient started saying that she did not feel well and wanted to go to the hospital.Daughter reports that she tried to get patient to describe which symptoms she was experiencing. Patient just repeated over and over that she was not feeling well. Patient herself is not able to contribute to review of systems due to dementia. At the moment of the exam patient reports that her breathing feels so/so. She reports that sometimes it feels good, but most the time it feels so-so.  8/27:Patient was admitted with recurrence of pneumonia thought to be HCAP. She is noted to have worsening symptoms of dementia that seem to exacerbate in the evenings. She does not appear to be taking her home medications as prescribed. She has been given 2 doses of Ativan overnight and is quite encephalopathic this morning and cannot be aroused by tactile stimulation. She was noted to have significant hypercapnia and was transferred to stepdown unit and placed on BiPAP with improvement in PCO2 levels noted thus far.  8/28: Patient more alert and awake this am, but continues to have persistent hypercarbia on ABG. She appears to be diuresing well with lasix and potassium levels have improved. Will switch azithromycin and Rocephin to Zosyn for aspiration coverage. SLP evaluation ordered and pending. Appreciate Pulmonology evaluation regarding hypercarbia and recurrent pneumonia. Seroquel to be initiated tonight.  8/29: Patient is doing much better this morning, unfortunately had periods of significant hallucinations and agitation yesterday evening.  She responds well to Seroquel and temporarily needed to be on restraints.  She  is now off BiPAP and on nasal cannula oxygen at 3.5 L and appears to be overall doing much better.  SLP evaluation does not reveal any swallowing issues and brain MRI with  no acute findings.  It appears she might require Seroquel at an earlier time to help prevent significant symptomatology.  We will plan to continue Zosyn for now as it still appears that she may have aspirated.  8/30: Patient is doing very well and did not have any periods of agitation or confusion overnight.  She was still on 5 L high flow nasal cannula this morning and is still mildly hypoxemic on 3 L nasal cannula.  She was also noted to have some low blood pressure readings as well as some tachycardia this morning after breathing treatment.  She is also noted to be mildly hypokalemic today.  Assessment & Plan:   Active Problems:   HCAP (healthcare-associated pneumonia)   Chronic obstructive bronchitis (HCC)   Dementia with behavioral disturbance (HCC)   Goals of care, counseling/discussion   Advanced care planning/counseling discussion   Palliative care by specialist   Recurrent pneumoniapossibly related to aspiration -Procalcitonin elevated -Continue IV Zosyn for now and plan to switch to Augmentin on discharge -SLP evaluation with no swallowing issues noted -Brain MRI performed with no acute findings  Acute hypercapnic encephalopathy secondary to sedation from Ativan in setting of COPD -No further need for BiPAP, appreciate pulmonology assessment -Hypercapnia has resolved and no further need for ABG at this time -May need to consider sleep study in the outpatient setting  History of dementia with sundowning along with hallucinations/agitation -Patient taken off Seroquel in the outpatient setting, but will continue now at 1700 which has been helping tremendously -Olanzapine could not be given given Amiodarone reaction -Please avoid lorazepam -Appreciate palliative care evaluation -We will need neurology follow-up outpatientregarding dementia -Brain MRI with no acute findings  Hypokalemia -Continue monitor on telemetry -Related to Lasix use with aggressive  diuresis -Replete today and monitor closely with a.m. labs and magnesium  Thyromegaly -TSH stable with last drawn on 10/26/2018  Type 2 diabetes with hyperglycemia-improved -Recent hemoglobin A1c 8.5% on 7/20 -Continue management with SSI  History of atrial flutter-currently in SR -Sinus tachycardia noted this morning which has now improved -Continue amiodarone, metoprolol,and Xarelto -Okay for transfer to telemetry  DVT prophylaxis:Xarelto Code Status:Full Family Communication:Discussed with daughterMonicaon phone 8/30 Disposition Plan:Continue treatment for suspected aspiration pneumonia with IV Zosyn.    Okay for transfer to telemetry today and monitor heart rate and blood pressures carefully.  Recheck a.m. labs.   Consultants:  Palliative  Pulmonology  Procedures:  None  Antimicrobials:  Anti-infectives (From admission, onward)   Start     Dose/Rate Route Frequency Ordered Stop   11/08/18 0730  piperacillin-tazobactam (ZOSYN) IVPB 3.375 g     3.375 g 12.5 mL/hr over 240 Minutes Intravenous Every 8 hours 11/08/18 0719     11/06/18 2300  cefTRIAXone (ROCEPHIN) 2 g in sodium chloride 0.9 % 100 mL IVPB  Status:  Discontinued     2 g 200 mL/hr over 30 Minutes Intravenous Every 24 hours 11/06/18 2258 11/08/18 0658   11/06/18 2300  azithromycin (ZITHROMAX) 500 mg in sodium chloride 0.9 % 250 mL IVPB  Status:  Discontinued     500 mg 250 mL/hr over 60 Minutes Intravenous Every 24 hours 11/06/18 2258 11/08/18 0658   11/06/18 2200  ceFEPIme (MAXIPIME) 2 g in sodium chloride 0.9 % 100 mL IVPB  Status:  Discontinued  2 g 200 mL/hr over 30 Minutes Intravenous Every 12 hours 11/06/18 2039 11/06/18 2141   11/06/18 2045  Vancomycin (VANCOCIN) 1,500 mg in sodium chloride 0.9 % 500 mL IVPB  Status:  Discontinued     1,500 mg 250 mL/hr over 120 Minutes Intravenous  Once 11/06/18 2039 11/06/18 2141       Subjective: Patient seen and evaluated today with no new  acute complaints or concerns. No acute concerns or events noted overnight.  She did not have any significant periods of agitation or confusion overnight.  She was noted to be somewhat tachycardic with soft blood pressure readings this morning as she missed her metoprolol dose last night.  Objective: Vitals:   11/10/18 0904 11/10/18 1000 11/10/18 1120 11/10/18 1121  BP: 95/74 104/66 98/66   Pulse: (!) 126 99 90   Resp:  (!) 21 18   Temp:      TempSrc:      SpO2:  (!) 87% 98%   Weight:    67.6 kg  Height:        Intake/Output Summary (Last 24 hours) at 11/10/2018 1201 Last data filed at 11/10/2018 0628 Gross per 24 hour  Intake 835.3 ml  Output 2150 ml  Net -1314.7 ml   Filed Weights   11/07/18 1100 11/08/18 0500 11/10/18 1121  Weight: 71.1 kg 69.8 kg 67.6 kg    Examination:  General exam: Appears calm and comfortable  Respiratory system: Clear to auscultation. Respiratory effort normal.  On 5 L high flow nasal cannula. Cardiovascular system: S1 & S2 heard, RRR, tachycardic. No JVD, murmurs, rubs, gallops or clicks. No pedal edema. Gastrointestinal system: Abdomen is nondistended, soft and nontender. No organomegaly or masses felt. Normal bowel sounds heard. Central nervous system: Alert and awake Extremities: No significant edema. Skin: No rashes, lesions or ulcers Psychiatry: Difficult to assess.    Data Reviewed: I have personally reviewed following labs and imaging studies  CBC: Recent Labs  Lab 11/06/18 1623 11/07/18 0638 11/08/18 0448 11/09/18 0431 11/10/18 0021 11/10/18 0503  WBC 14.9* 11.6* 13.5* 10.9* 8.0 6.9  NEUTROABS 12.3*  --   --   --  5.6  --   HGB 10.7* 10.7* 10.8* 11.9* 11.0* 11.6*  HCT 39.0 39.2 37.2 42.5 38.6 41.0  MCV 101.6* 103.2* 97.4 100.5* 98.5 98.8  PLT 307 288 203 257 223 [id number]   Basic Metabolic Panel: Recent Labs  Lab 11/06/18 1623 11/06/18 1648 11/07/18 0638 11/08/18 0448 11/09/18 0431 11/10/18 0503  NA 138  --  142 143 142 141   K 5.0  --  5.9* 5.0 4.1 3.1*  CL 91*  --  94* 91* 85* 85*  CO2 37*  --  36* 38* 36* 41*  GLUCOSE 180*  --  218* 101* 67* 118*  BUN 14  --  19 19 16 16   CREATININE 0.80  --  1.05* 0.82 0.83 0.84  CALCIUM 9.3  --  9.1 8.8* 9.5 9.0  MG  --  2.0  --   --   --   --   PHOS  --  3.7  --   --   --   --    GFR: Estimated Creatinine Clearance: 53.8 mL/min (by C-G formula based on SCr of 0.84 mg/dL). Liver Function Tests: Recent Labs  Lab 11/06/18 1623 11/07/18 0638  AST 24 82*  ALT 26 70*  ALKPHOS 89 103  BILITOT 0.6 0.7  PROT 6.8 6.8  ALBUMIN 3.8 3.6   No results  for input(s): LIPASE, AMYLASE in the last 168 hours. No results for input(s): AMMONIA in the last 168 hours. Coagulation Profile: No results for input(s): INR, PROTIME in the last 168 hours. Cardiac Enzymes: No results for input(s): CKTOTAL, CKMB, CKMBINDEX, TROPONINI in the last 168 hours. BNP (last 3 results) No results for input(s): PROBNP in the last 8760 hours. HbA1C: No results for input(s): HGBA1C in the last 72 hours. CBG: Recent Labs  Lab 11/09/18 1124 11/09/18 1613 11/09/18 2123 11/10/18 0733 11/10/18 1119  GLUCAP 223* 84 185* 123* 232*   Lipid Profile: No results for input(s): CHOL, HDL, LDLCALC, TRIG, CHOLHDL, LDLDIRECT in the last 72 hours. Thyroid Function Tests: No results for input(s): TSH, T4TOTAL, FREET4, T3FREE, THYROIDAB in the last 72 hours. Anemia Panel: No results for input(s): VITAMINB12, FOLATE, FERRITIN, TIBC, IRON, RETICCTPCT in the last 72 hours. Sepsis Labs: Recent Labs  Lab 11/07/18 1528  PROCALCITON 0.74    Recent Results (from the past 240 hour(s))  SARS Coronavirus 2 [facility] order, Performed in [facility] hospital lab) Nasopharyngeal Urine, Random     Status: None   Collection Time: 11/06/18  4:38 PM   Specimen: Urine, Random; Nasopharyngeal  Result Value Ref Range Status   SARS Coronavirus 2 NEGATIVE NEGATIVE Final    Comment: (NOTE) If result is  NEGATIVE SARS-CoV-2 target nucleic acids are NOT DETECTED. The SARS-CoV-2 RNA is generally detectable in upper and lower  respiratory specimens during the acute phase of infection. The lowest  concentration of SARS-CoV-2 viral copies this assay can detect is 250  copies / mL. A negative result does not preclude SARS-CoV-2 infection  and should not be used as the sole basis for treatment or other  patient management decisions.  A negative result may occur with  improper specimen collection / handling, submission of specimen other  than nasopharyngeal swab, presence of viral mutation(s) within the  areas targeted by this assay, and inadequate number of viral copies  (<250 copies / mL). A negative result must be combined with clinical  observations, patient history, and epidemiological information. If result is POSITIVE SARS-CoV-2 target nucleic acids are DETECTED. The SARS-CoV-2 RNA is generally detectable in upper and lower  respiratory specimens dur ing the acute phase of infection.  Positive  results are indicative of active infection with SARS-CoV-2.  Clinical  correlation with patient history and other diagnostic information is  necessary to determine patient infection status.  Positive results do  not rule out bacterial infection or co-infection with other viruses. If result is PRESUMPTIVE POSTIVE SARS-CoV-2 nucleic acids MAY BE PRESENT.   A presumptive positive result was obtained on the submitted specimen  and confirmed on repeat testing.  While 2019 novel coronavirus  (SARS-CoV-2) nucleic acids may be present in the submitted sample  additional confirmatory testing may be necessary for epidemiological  and / or clinical management purposes  to differentiate between  SARS-CoV-2 and other Sarbecovirus currently known to infect humans.  If clinically indicated additional testing with an alternate test  methodology [phone]) is advised. The SARS-CoV-2 RNA is generally  detectable  in upper and lower respiratory sp ecimens during the acute  phase of infection. The expected result is Negative. Fact Sheet for Patients:  StrictlyIdeas.no Fact Sheet for Healthcare Providers: BankingDealers.co.za This test is not yet approved or cleared by the [place] FDA and has been authorized for detection and/or diagnosis of SARS-CoV-2 by FDA under an Emergency Use Authorization (EUA).  This EUA will remain in effect (meaning this  test can be used) for the duration of the COVID-19 declaration under Section 564(b)(1) of the Act, 21 U.S.C. section 360bbb-3(b)(1), unless the authorization is terminated or revoked sooner. Performed at [facility], [address] [place], [place] [address]   MRSA PCR Screening     Status: None   Collection Time: 11/06/18 11:00 PM   Specimen: Nasal Mucosa; Nasopharyngeal  Result Value Ref Range Status   MRSA by PCR NEGATIVE NEGATIVE Final    Comment:        The GeneXpert MRSA Assay (FDA approved for NASAL specimens only), is one component of a comprehensive MRSA colonization surveillance program. It is not intended to diagnose MRSA infection nor to guide or monitor treatment for MRSA infections. Performed at [facility], [address] [place], [place] [address]          Radiology Studies: Mr Brain 28 Contrast  Result Date: 11/08/2018 CLINICAL DATA:  73 year old female with unexplained altered mental status for 2 days. Encephalopathy. EXAM: MRI HEAD WITHOUT CONTRAST TECHNIQUE: Multiplanar, multiecho pulse sequences of the brain and surrounding structures were obtained without intravenous contrast. COMPARISON:  Head CTs 11/06/2018 and earlier. FINDINGS: Brain: No restricted diffusion to suggest acute infarction. No midline shift, mass effect, evidence of mass lesion, ventriculomegaly, extra-axial collection or acute intracranial hemorrhage. Cervicomedullary junction and pituitary are within  normal limits. Axial T2 images are suboptimal due to motion, and the patient discontinued the exam before coronal T2 images could be obtained. However, gray and white matter signal is largely normal for age throughout the brain. There is mild nonspecific patchy mostly periventricular white matter T2 and FLAIR hyperintensity. No cortical encephalomalacia or chronic blood products identified. The deep gray nuclei, brainstem and cerebellum appear negative. Vascular: Major intracranial vascular flow voids are preserved. Skull and upper cervical spine: Negative visible cervical spine. Visualized bone marrow signal is within normal limits. Sinuses/Orbits: Motion artifact at the orbits which appear grossly negative. Paranasal sinuses are stable and well pneumatized. Other: There is a mild right mastoid effusion redemonstrated. Negative nasopharynx. Left mastoids are clear. Scalp and face soft tissues appear negative. IMPRESSION: 1. No acute intracranial abnormality, and largely negative for age non-contrast MRI appearance of the brain allowing for some motion degradation. 2. Mild right mastoid effusion, likely postinflammatory. Electronically Signed   By: Genevie Ann M.D.   On: 11/08/2018 15:32        Scheduled Meds:  amiodarone  200 mg Oral Daily   Chlorhexidine Gluconate Cloth  6 each Topical Daily   docusate sodium  100 mg Oral Daily   fluticasone furoate-vilanterol  1 puff Inhalation Daily   And   umeclidinium bromide  1 puff Inhalation Daily   insulin aspart  0-9 Units Subcutaneous TID WC   metoprolol tartrate  5 mg Intravenous Once   metoprolol tartrate  50 mg Oral BID   potassium chloride  40 mEq Oral BID   QUEtiapine  25 mg Oral QHS   rivaroxaban  20 mg Oral Q supper   Continuous Infusions:  piperacillin-tazobactam (ZOSYN)  IV 3.375 g (11/10/18 0627)     LOS: 4 days    Time spent: 30 minutes    Rodolfo Gaster Darleen Crocker, DO [facility] Pager [phone]  If 7PM-7AM, please  contact night-coverage [email or web address] Password [email or web address] 11/10/2018, 12:01 PM

## 2018-11-10 NOTE — Progress Notes (Signed)
Patient placed on BiPAP but pulled off again nurse place on 5 liter oxygen

## 2018-11-10 NOTE — Progress Notes (Signed)
Placed on BiPAP, patent took off in about 15 min, appeared confused placed back on by nurse.

## 2018-11-10 NOTE — Progress Notes (Signed)
Subjective: She looks about the same.  She is sitting up and on nasal oxygen now.  Still mildly confused I think.  Objective: Vital signs in last 24 hours: Temp:  [97.5 F (36.4 C)-98 F (36.7 C)] 97.8 F (36.6 C) (08/30 0733) Pulse Rate:  [39-136] 74 (08/30 0733) Resp:  [16-31] 17 (08/30 0733) BP: (76-115)/(48-82) 92/56 (08/30 0700) SpO2:  [62 %-100 %] 92 % (08/30 0817) Weight change:  Last BM Date: (PTA)  Intake/Output from previous day: 08/29 0701 - 08/30 0700 In: 835.3 [P.O.:240; IV Piggyback:595.3] Out: 2650 [Urine:2650]  PHYSICAL EXAM General appearance: alert, cooperative and no distress Resp: rhonchi bilaterally Cardio: regular rate and rhythm, S1, S2 normal, no murmur, click, rub or gallop GI: soft, non-tender; bowel sounds normal; no masses,  no organomegaly Extremities: extremities normal, atraumatic, no cyanosis or edema  Lab Results:  Results for orders placed or performed during the hospital encounter of 11/06/18 (from the past 48 hour(s))  Glucose, capillary     Status: Abnormal   Collection Time: 11/08/18 10:45 AM  Result Value Ref Range   Glucose-Capillary 112 (H) 70 - 99 mg/dL  Glucose, capillary     Status: Abnormal   Collection Time: 11/08/18  3:59 PM  Result Value Ref Range   Glucose-Capillary 157 (H) 70 - 99 mg/dL  Glucose, capillary     Status: Abnormal   Collection Time: 11/08/18 10:15 PM  Result Value Ref Range   Glucose-Capillary 101 (H) 70 - 99 mg/dL  CBC     Status: Abnormal   Collection Time: 11/09/18  4:31 AM  Result Value Ref Range   WBC 10.9 (H) 4.0 - 10.5 K/uL   RBC 4.23 3.87 - 5.11 MIL/uL   Hemoglobin 11.9 (L) 12.0 - 15.0 g/dL   HCT 42.5 36.0 - 46.0 %   MCV 100.5 (H) 80.0 - 100.0 fL   MCH 28.1 26.0 - 34.0 pg   MCHC 28.0 (L) 30.0 - 36.0 g/dL   RDW 14.5 11.5 - 15.5 %   Platelets 257 150 - 400 K/uL   nRBC 0.0 0.0 - 0.2 %    Comment: Performed at [facility], [address] [place], [place] [id number]  Basic metabolic panel      Status: Abnormal   Collection Time: 11/09/18  4:31 AM  Result Value Ref Range   Sodium 142 135 - 145 mmol/L   Potassium 4.1 3.5 - 5.1 mmol/L    Comment: DELTA CHECK NOTED   Chloride 85 (L) 98 - 111 mmol/L   CO2 36 (H) 22 - 32 mmol/L   Glucose, Bld 67 (L) 70 - 99 mg/dL   BUN 16 8 - 23 mg/dL   Creatinine, Ser 0.83 0.44 - 1.00 mg/dL   Calcium 9.5 8.9 - 10.3 mg/dL   GFR calc non Af Amer >60 >60 mL/min   GFR calc Af Amer >60 >60 mL/min   Anion gap >20 (H) 5 - 15    Comment: Performed at [facility], [address] [place], [place] [address]  Blood gas, arterial     Status: Abnormal   Collection Time: 11/09/18  5:00 AM  Result Value Ref Range   FIO2 36.00    pH, Arterial 7.382 7.350 - 7.450   pCO2 arterial 79.9 (HH) 32.0 - 48.0 mmHg    Comment: CRITICAL RESULT CALLED TO, READ BACK BY AND VERIFIED WITH: WADE,S @ 0526 ON 11/09/18 BY JUW    pO2, Arterial 53.9 (L) 83.0 - 108.0 mmHg   Bicarbonate 41.9 (  H) 20.0 - 28.0 mmol/L   Acid-Base Excess 20.0 (H) 0.0 - 2.0 mmol/L   O2 Saturation 83.2 %   Patient temperature 37.0    Allens test (pass/fail) NOT INDICATED (A) PASS    Comment: Performed at [facility], [address] [place], [place] [address]  Glucose, capillary     Status: None   Collection Time: 11/09/18  7:47 AM  Result Value Ref Range   Glucose-Capillary 80 70 - 99 mg/dL  Glucose, capillary     Status: Abnormal   Collection Time: 11/09/18 11:24 AM  Result Value Ref Range   Glucose-Capillary 223 (H) 70 - 99 mg/dL  Glucose, capillary     Status: None   Collection Time: 11/09/18  4:13 PM  Result Value Ref Range   Glucose-Capillary 84 70 - 99 mg/dL  Glucose, capillary     Status: Abnormal   Collection Time: 11/09/18  9:23 PM  Result Value Ref Range   Glucose-Capillary 185 (H) 70 - 99 mg/dL  CBC with Differential/Platelet     Status: Abnormal   Collection Time: 11/10/18 12:21 AM  Result Value Ref Range   WBC 8.0 4.0 - 10.5 K/uL   RBC 3.92 3.87 - 5.11 MIL/uL   Hemoglobin  11.0 (L) 12.0 - 15.0 g/dL   HCT 38.6 36.0 - 46.0 %   MCV 98.5 80.0 - 100.0 fL   MCH 28.1 26.0 - 34.0 pg   MCHC 28.5 (L) 30.0 - 36.0 g/dL   RDW 14.4 11.5 - 15.5 %   Platelets 223 150 - 400 K/uL   nRBC 0.0 0.0 - 0.2 %   Neutrophils Relative % 69 %   Neutro Abs 5.6 1.7 - 7.7 K/uL   Lymphocytes Relative 15 %   Lymphs Abs 1.2 0.7 - 4.0 K/uL   Monocytes Relative 13 %   Monocytes Absolute 1.1 (H) 0.1 - 1.0 K/uL   Eosinophils Relative 2 %   Eosinophils Absolute 0.1 0.0 - 0.5 K/uL   Basophils Relative 0 %   Basophils Absolute 0.0 0.0 - 0.1 K/uL   Immature Granulocytes 1 %   Abs Immature Granulocytes 0.04 0.00 - 0.07 K/uL    Comment: Performed at [facility], [address] [place], [place] [id number]  Basic metabolic panel     Status: Abnormal   Collection Time: 11/10/18  5:03 AM  Result Value Ref Range   Sodium 141 135 - 145 mmol/L   Potassium 3.1 (L) 3.5 - 5.1 mmol/L   Chloride 85 (L) 98 - 111 mmol/L   CO2 41 (H) 22 - 32 mmol/L   Glucose, Bld 118 (H) 70 - 99 mg/dL   BUN 16 8 - 23 mg/dL   Creatinine, Ser 0.84 0.44 - 1.00 mg/dL   Calcium 9.0 8.9 - 10.3 mg/dL   GFR calc non Af Amer >60 >60 mL/min   GFR calc Af Amer >60 >60 mL/min   Anion gap 15 5 - 15    Comment: Performed at [facility], [address] [place], [place] [address]  CBC     Status: Abnormal   Collection Time: 11/10/18  5:03 AM  Result Value Ref Range   WBC 6.9 4.0 - 10.5 K/uL   RBC 4.15 3.87 - 5.11 MIL/uL   Hemoglobin 11.6 (L) 12.0 - 15.0 g/dL   HCT 41.0 36.0 - 46.0 %   MCV 98.8 80.0 - 100.0 fL   MCH 28.0 26.0 - 34.0 pg   MCHC 28.3 (L) 30.0 - 36.0 g/dL  RDW 14.3 11.5 - 15.5 %   Platelets 263 150 - 400 K/uL   nRBC 0.0 0.0 - 0.2 %    Comment: Performed at [facility], [address], [place], [place] [address]  Glucose, capillary     Status: Abnormal   Collection Time: 11/10/18  7:33 AM  Result Value Ref Range   Glucose-Capillary 123 (H) 70 - 99 mg/dL    ABGS Recent Labs    11/09/18 0500  PHART 7.382   PO2ART 53.9*  HCO3 41.9*   CULTURES Recent Results (from the past 240 hour(s))  SARS Coronavirus 2 [facility] order, Performed in [facility] hospital lab) Nasopharyngeal Urine, Random     Status: None   Collection Time: 11/06/18  4:38 PM   Specimen: Urine, Random; Nasopharyngeal  Result Value Ref Range Status   SARS Coronavirus 2 NEGATIVE NEGATIVE Final    Comment: (NOTE) If result is NEGATIVE SARS-CoV-2 target nucleic acids are NOT DETECTED. The SARS-CoV-2 RNA is generally detectable in upper and lower  respiratory specimens during the acute phase of infection. The lowest  concentration of SARS-CoV-2 viral copies this assay can detect is 250  copies / mL. A negative result does not preclude SARS-CoV-2 infection  and should not be used as the sole basis for treatment or other  patient management decisions.  A negative result may occur with  improper specimen collection / handling, submission of specimen other  than nasopharyngeal swab, presence of viral mutation(s) within the  areas targeted by this assay, and inadequate number of viral copies  (<250 copies / mL). A negative result must be combined with clinical  observations, patient history, and epidemiological information. If result is POSITIVE SARS-CoV-2 target nucleic acids are DETECTED. The SARS-CoV-2 RNA is generally detectable in upper and lower  respiratory specimens dur ing the acute phase of infection.  Positive  results are indicative of active infection with SARS-CoV-2.  Clinical  correlation with patient history and other diagnostic information is  necessary to determine patient infection status.  Positive results do  not rule out bacterial infection or co-infection with other viruses. If result is PRESUMPTIVE POSTIVE SARS-CoV-2 nucleic acids MAY BE PRESENT.   A presumptive positive result was obtained on the submitted specimen  and confirmed on repeat testing.  While 2019 novel coronavirus  (SARS-CoV-2) nucleic  acids may be present in the submitted sample  additional confirmatory testing may be necessary for epidemiological  and / or clinical management purposes  to differentiate between  SARS-CoV-2 and other Sarbecovirus currently known to infect humans.  If clinically indicated additional testing with an alternate test  methodology [phone]) is advised. The SARS-CoV-2 RNA is generally  detectable in upper and lower respiratory sp ecimens during the acute  phase of infection. The expected result is Negative. Fact Sheet for Patients:  StrictlyIdeas.no Fact Sheet for Healthcare Providers: BankingDealers.co.za This test is not yet approved or cleared by the [place] FDA and has been authorized for detection and/or diagnosis of SARS-CoV-2 by FDA under an Emergency Use Authorization (EUA).  This EUA will remain in effect (meaning this test can be used) for the duration of the COVID-19 declaration under Section 564(b)(1) of the Act, 21 U.S.C. section 360bbb-3(b)(1), unless the authorization is terminated or revoked sooner. Performed at [facility], [address] [place], [place] [address]   MRSA PCR Screening     Status: None   Collection Time: 11/06/18 11:00 PM   Specimen: Nasal Mucosa; Nasopharyngeal  Result Value Ref Range Status  MRSA by PCR NEGATIVE NEGATIVE Final    Comment:        The GeneXpert MRSA Assay (FDA approved for NASAL specimens only), is one component of a comprehensive MRSA colonization surveillance program. It is not intended to diagnose MRSA infection nor to guide or monitor treatment for MRSA infections. Performed at [facility], [address] [place], [place] [address]    Studies/Results: Mr Brain 8 Contrast  Result Date: 11/08/2018 CLINICAL DATA:  73 year old female with unexplained altered mental status for 2 days. Encephalopathy. EXAM: MRI HEAD WITHOUT CONTRAST TECHNIQUE: Multiplanar, multiecho pulse  sequences of the brain and surrounding structures were obtained without intravenous contrast. COMPARISON:  Head CTs 11/06/2018 and earlier. FINDINGS: Brain: No restricted diffusion to suggest acute infarction. No midline shift, mass effect, evidence of mass lesion, ventriculomegaly, extra-axial collection or acute intracranial hemorrhage. Cervicomedullary junction and pituitary are within normal limits. Axial T2 images are suboptimal due to motion, and the patient discontinued the exam before coronal T2 images could be obtained. However, gray and white matter signal is largely normal for age throughout the brain. There is mild nonspecific patchy mostly periventricular white matter T2 and FLAIR hyperintensity. No cortical encephalomalacia or chronic blood products identified. The deep gray nuclei, brainstem and cerebellum appear negative. Vascular: Major intracranial vascular flow voids are preserved. Skull and upper cervical spine: Negative visible cervical spine. Visualized bone marrow signal is within normal limits. Sinuses/Orbits: Motion artifact at the orbits which appear grossly negative. Paranasal sinuses are stable and well pneumatized. Other: There is a mild right mastoid effusion redemonstrated. Negative nasopharynx. Left mastoids are clear. Scalp and face soft tissues appear negative. IMPRESSION: 1. No acute intracranial abnormality, and largely negative for age non-contrast MRI appearance of the brain allowing for some motion degradation. 2. Mild right mastoid effusion, likely postinflammatory. Electronically Signed   By: Genevie Ann M.D.   On: 11/08/2018 15:32    Medications:  Prior to Admission:  Medications Prior to Admission  Medication Sig Dispense Refill Last Dose  . acetaminophen (TYLENOL) 500 MG tablet Take 500 mg by mouth every 8 (eight) hours as needed for mild pain or headache.    11/06/2018 at Unknown time  . albuterol (PROVENTIL) (2.5 MG/3ML) 0.083% nebulizer solution Take 2.5 mg by  nebulization every 6 (six) hours as needed for wheezing or shortness of breath.   11/06/2018 at Unknown time  . amiodarone (PACERONE) 200 MG tablet Take 1 tablet (200 mg total) by mouth daily. 90 tablet 3 11/06/2018 at Unknown time  . docusate sodium (COLACE) 100 MG capsule Take 1 capsule (100 mg total) by mouth 2 (two) times daily. (Patient taking differently: Take 100 mg by mouth daily. ) 30 capsule 1 11/06/2018 at Unknown time  . furosemide (LASIX) 40 MG tablet Take 1.5 tablets (60 mg total) by mouth daily. (Patient taking differently: Take 60 mg by mouth every other day. )   11/06/2018 at Unknown time  . guaiFENesin (MUCINEX) 600 MG 12 hr tablet Take 1 tablet (600 mg total) by mouth 2 (two) times daily. 30 tablet 0 Past Month at Unknown time  . metFORMIN (GLUCOPHAGE-XR) 500 MG 24 hr tablet Take 500 mg by mouth daily with breakfast.   1 11/06/2018 at Unknown time  . metoprolol tartrate (LOPRESSOR) 50 MG tablet Take 50 mg by mouth 2 (two) times daily.   11/06/2018 at 0900  . potassium chloride SA (K-DUR) 20 MEQ tablet Take 20 mEq by mouth 2 (two) times daily.   11/06/2018 at Unknown  time  . PROAIR HFA 108 (90 BASE) MCG/ACT inhaler Inhale 2 puffs into the lungs every 6 (six) hours as needed for wheezing or shortness of breath.    11/06/2018 at Unknown time  . rivaroxaban (XARELTO) 20 MG TABS tablet Take 1 tablet (20 mg total) by mouth daily with supper. Stop Xarelto until Monday (08/26/18) in anticipation for kyphoplasty 21 tablet 0 11/05/2018 at 1900  . TRELEGY ELLIPTA 100-62.5-25 MCG/INH AEPB Take 1 puff by mouth daily.  0 11/06/2018 at Unknown time  . polyethylene glycol (MIRALAX / GLYCOLAX) 17 g packet Take 17 g by mouth daily as needed for mild constipation. (Patient not taking: Reported on 10/24/2018) 28 each 0 Unknown at Unknown time   Scheduled: . amiodarone  200 mg Oral Daily  . Chlorhexidine Gluconate Cloth  6 each Topical Daily  . docusate sodium  100 mg Oral Daily  . fluticasone furoate-vilanterol   1 puff Inhalation Daily   And  . umeclidinium bromide  1 puff Inhalation Daily  . furosemide  60 mg Oral Daily  . insulin aspart  0-9 Units Subcutaneous TID WC  . metoprolol tartrate  50 mg Oral BID  . potassium chloride  40 mEq Oral BID  . QUEtiapine  25 mg Oral QHS  . rivaroxaban  20 mg Oral Q supper  . sodium zirconium cyclosilicate  10 g Oral Once   Continuous: . piperacillin-tazobactam (ZOSYN)  IV 3.375 g (11/10/18 0627)   [id number], ipratropium, levalbuterol, polyethylene glycol  Assesment: She was admitted with COPD exacerbation Healthcare associated pneumonia and acute on chronic hypoxic and hypercapnic respiratory failure.  She has been using BiPAP and has improved.  Her situation is complicated by the fact that she has dementia and has episodes of sundowning  She has heart failure which seems to be fairly stable  Her pneumonia may be related to aspiration although she did well with her swallow evaluation. Active Problems:   HCAP (healthcare-associated pneumonia)   Chronic obstructive bronchitis ([place])   Dementia with behavioral disturbance ([place])   Goals of care, counseling/discussion   Advanced care planning/counseling discussion   Palliative care by specialist    Plan: Continue current treatment    LOS: 4 days   Alonza Bogus 11/10/2018, 8:55 AM

## 2018-11-10 NOTE — Progress Notes (Signed)
Patient having episodes of tachycardia: HR up to 160's, then sustained at 110-120. Dr. Manuella Ghazi made aware: transfer to med-surgical unit delayed at this time. Ordered cardiac PO medications given. Sinus rhythm resumed in the 90's. Okay to transfer per MD (on unit). Report called to Linna Hoff, RN.

## 2018-11-10 NOTE — Progress Notes (Addendum)
Question patient earlier at shift change about use of CPAP/BiPAP. Patient does not wish and refuses CPAP. She only wears oxygen at night at home. She does not have BiPAP / CPAP at home. She probably would benefit from use. But she appears to have dementia to some degree so it is doubtful she will ever be able to use regular. BiPAP for HS will be DC. Saturation was 92 on 4 liters. Nurse notified.

## 2018-11-11 DIAGNOSIS — I10 Essential (primary) hypertension: Secondary | ICD-10-CM | POA: Diagnosis not present

## 2018-11-11 DIAGNOSIS — J449 Chronic obstructive pulmonary disease, unspecified: Secondary | ICD-10-CM | POA: Diagnosis not present

## 2018-11-11 DIAGNOSIS — I480 Paroxysmal atrial fibrillation: Secondary | ICD-10-CM | POA: Diagnosis not present

## 2018-11-11 LAB — CBC
HCT: 43.7 % (ref 36.0–46.0)
Hemoglobin: 12 g/dL (ref 12.0–15.0)
MCH: 27.5 pg (ref 26.0–34.0)
MCHC: 27.5 g/dL — ABNORMAL LOW (ref 30.0–36.0)
MCV: 100 fL (ref 80.0–100.0)
Platelets: 260 10*3/uL (ref 150–400)
RBC: 4.37 MIL/uL (ref 3.87–5.11)
RDW: 14.2 % (ref 11.5–15.5)
WBC: 8.4 10*3/uL (ref 4.0–10.5)
nRBC: 0 % (ref 0.0–0.2)

## 2018-11-11 LAB — BASIC METABOLIC PANEL
Anion gap: 15 (ref 5–15)
BUN: 13 mg/dL (ref 8–23)
CO2: 38 mmol/L — ABNORMAL HIGH (ref 22–32)
Calcium: 9.4 mg/dL (ref 8.9–10.3)
Chloride: 89 mmol/L — ABNORMAL LOW (ref 98–111)
Creatinine, Ser: 0.71 mg/dL (ref 0.44–1.00)
GFR calc Af Amer: >60 mL/min (ref >60)
GFR calc non Af Amer: >60 mL/min (ref >60)
Glucose, Bld: 90 mg/dL (ref 70–99)
Potassium: 4 mmol/L (ref 3.5–5.1)
Sodium: 142 mmol/L (ref 135–145)

## 2018-11-11 LAB — GLUCOSE, CAPILLARY
Glucose-Capillary: 93 mg/dL (ref 70–99)
Glucose-Capillary: 200 mg/dL — ABNORMAL HIGH (ref 70–99)

## 2018-11-11 LAB — MAGNESIUM: Magnesium: 1.8 mg/dL (ref 1.7–2.4)

## 2018-11-11 MED ORDER — FUROSEMIDE 40 MG PO TABS: 60.0000 mg | ORAL_TABLET | Freq: Every day | ORAL | Status: DC

## 2018-11-11 MED ORDER — QUETIAPINE FUMARATE 25 MG PO TABS: 25.0000 mg | ORAL_TABLET | Freq: Every day | ORAL | 3 refills | Status: DC

## 2018-11-11 MED ORDER — AMOXICILLIN-POT CLAVULANATE 875-125 MG PO TABS: 1.0000 | ORAL_TABLET | Freq: Two times a day (BID) | ORAL | 0 refills | Status: DC

## 2018-11-11 NOTE — Care Management Important Message (Signed)
Important Message  Patient Details  Name: Eileen [name] MRN: [id number] Date of Birth: 16-Feb-1946   Medicare Important Message Given:  Yes     [name] 11/11/2018, 2:20 PM

## 2018-11-11 NOTE — TOC Transition Note (Addendum)
Transition of Care [facility]) - CM/SW Discharge Note   Patient Details  Name: Eileen [name] MRN: [id number] Date of Birth: 17-Nov-1945  [facility] CM/SW Contact:  Shade Flood, LCSW Phone Number: 11/11/2018, 10:22 AM   Clinical Narrative:     Pt discharging home today. Updated Linda at [place] and [facility] CM. Pt has follow up with her CM and with her cardiologist this week. There are no other TOC needs for dc.  1505: Received message from [facility] [place] that they had discharged pt from their services and couldn't take her back due to "capacity." Referred pt to [facility] and they will follow up with pt at home. Updated pt's daughter.  Final next level of care: [place] Barriers to Discharge: Barriers Resolved   Patient Goals and CMS Choice        Discharge Placement                       Discharge Plan and Services                                     Social Determinants of Health (SDOH) Interventions     Readmission Risk Interventions Readmission Risk Prevention Plan 11/11/2018 10/25/2018 10/24/2018  Transportation Screening Complete - Complete  PCP or Specialist Appt within 5-7 Days - - -  Home Care Screening - - -  Medication Review (RN CM) - - -  Medication Review ([place]) Complete - Complete  PCP or Specialist appointment within 3-5 days of discharge Complete Complete -  [place] or Home Care Consult Complete Complete -  SW Recovery Care/Counseling Consult Complete - Complete  Palliative Care Screening Complete Not Complete -  Comments - Palliative not available -  [place] Not Applicable Not Applicable -  Some recent data might be hidden

## 2018-11-11 NOTE — Discharge Summary (Signed)
Physician Discharge Summary  [name] [id number] DOB: [birth year] DOA: 11/06/2018  PCP: Lavella Lemons, PA  Admit date: 11/06/2018  Discharge date: 11/11/2018  Admitted From:Home  Disposition:  Home  Recommendations for Outpatient Follow-up:  1. Follow up with PCP in 1-2 weeks 2. Continue on Augmentin for 3 more days to finish course of treatment for aspiration pneumonia 3. Continue on Seroquel as prescribed between 1700-1800 each day prior to sundowning symptoms.  Discussed this with daughter who understands the importance of remaining on this medication.  4. Continue on other home medications as prior.  Home Health: Active with home health services  Equipment/Devices: None, has home nasal cannula oxygen  Discharge Condition: Stable  CODE STATUS: Full  Diet recommendation: Heart Healthy/carb modified  Brief/Interim Summary: Per HPI: [name],with history of right breast cancer in remission, L1 compression fracture status post kyphoplasty, iron deficiency anemia, diverticulosis, diabetes mellitus type 2, atrial flutter, moderate mitral regurgitation (E echo 06-27-2026), COPD, chronic respiratory failure on 3.5 L at baseline presents to the ER today because she does not feel well. Patient has dementia and is not able to offer reliable history. She is alert and oriented x2-to self and place,but she is not able to accurately identify her daughter at bedside. History is taken from chart review, daughter at bedside, and primary caregiver [name] (another daughter) whose phone number is [phone].  Patiently recently admitted on October 24, 2018 for COPD exacerbation. At that time she was started on vancomycin, cefepime, and had MRSA screening, urine Legionella antigen, and urine strep antigen,as well as 2 sets of blood cultures. At that time blood cultures had no growth at 5 days. MRSA screen by PCR has been consistently negative on October 24, 2018, October 19, 2018, September 26, 2018, Aug 10, 2018, etc. Urinary antigen for strep pneumo has been negative consistently as well over the last 3 years. Urine Legionella was negative at that admission as well. Unfortunately I am unable to locate the sputum culture.  Today, daughter [name], reports that patient requested to call ambulance to come into the ED due to not feeling well. Daughter does report the patient has had decreased appetite over the past 2 days. Although she has eaten it has not been her normal amount. Patient has not had any vomiting diarrhea or foul-smelling urine. Patient has not had a cough more than her normal. She has not required an increase in her oxygen supplementation. Daughter thinks that this complaint is secondary to dementia. She reports that the patient gets much worse every evening. And it was not until evening that the patient started saying that she did not feel well and wanted to go to the hospital.Daughter reports that she tried to get patient to describe which symptoms she was experiencing. Patient just repeated over and over that she was not feeling well. Patient herself is not able to contribute to review of systems due to dementia. At the moment of the exam patient reports that her breathing feels so/so. She reports that sometimes it feels good, but most the time it feels so-so.  8/27:Patient was admitted with recurrence of pneumonia thought to be HCAP. She is noted to have worsening symptoms of dementia that seem to exacerbate in the evenings. She does not appear to be taking her home medications as prescribed. She has been given 2 doses of Ativan overnight and is quite encephalopathic this morning and cannot be aroused by tactile stimulation. She was noted to have significant hypercapnia and  was transferred to stepdown unit and placed on BiPAP with improvement in PCO2 levels noted thus far.  8/28: Patient more alert and awake this am, but  continues to have persistent hypercarbia on ABG. She appears to be diuresing well with lasix and potassium levels have improved. Will switch azithromycin and Rocephin to Zosyn for aspiration coverage. SLP evaluation ordered and pending. Appreciate Pulmonology evaluation regarding hypercarbia and recurrent pneumonia. Seroquel to be initiated tonight.  8/29:Patient is doing much better this morning, unfortunately had periods of significant hallucinations and agitation yesterday evening. She responds well to Seroquel and temporarily needed to be on restraints. She is now off BiPAP and on nasal cannula oxygen at 3.5 L and appears to be overall doing much better. SLP evaluation does not reveal any swallowing issues and brain MRI with no acute findings. It appears she might require Seroquel at an earlier time to help prevent significant symptomatology. We will plan to continue Zosyn for now as it still appears that she may have aspirated.  8/30: Patient is doing very well and did not have any periods of agitation or confusion overnight.  She was still on 5 L high flow nasal cannula this morning and is still mildly hypoxemic on 3 L nasal cannula.  She was also noted to have some low blood pressure readings as well as some tachycardia this morning after breathing treatment.  She is also noted to be mildly hypokalemic today.  8/31: Patient is stable for discharge today with no periods of agitation or confusion overall noted.  She is doing quite well this morning and remains on 4 L nasal cannula which is near her usual baseline.  She has had stable heart rate and blood pressure readings.  She will need to complete her course of treatment with Augmentin for 3 more days to complete a 7-day course of treatment for likely aspiration pneumonia.  Discussed with patient's daughter regarding the need to remain on Seroquel every evening to prevent sundowning symptoms.  She needs to also remain on Lasix daily as well as  potassium supplementation to maintain euvolemia.  Palliative care consulted during this admission and family does not want to consider hospice care or facility placement at this time.  Discharge Diagnoses:  Active Problems:   HCAP (healthcare-associated pneumonia)   Chronic obstructive bronchitis (HCC)   Dementia with behavioral disturbance ([place])   Goals of care, counseling/discussion   Advanced care planning/counseling discussion   Palliative care by specialist  Principal discharge diagnosis: Recurrent pneumonia likely related to aspiration in the setting of sundowning/dementia.  Discharge Instructions  Discharge Instructions    Diet - low sodium heart healthy   Complete by: As directed    Increase activity slowly   Complete by: As directed      Allergies as of 11/11/2018      Reactions   Codeine Other (See Comments)   "jittery"      Medication List    TAKE these medications   acetaminophen 500 MG tablet Commonly known as: TYLENOL Take 500 mg by mouth every 8 (eight) hours as needed for mild pain or headache.   amiodarone 200 MG tablet Commonly known as: PACERONE Take 1 tablet (200 mg total) by mouth daily.   amoxicillin-clavulanate 875-125 MG tablet Commonly known as: Augmentin Take 1 tablet by mouth 2 (two) times daily for 3 days.   docusate sodium 100 MG capsule Commonly known as: Colace Take 1 capsule (100 mg total) by mouth 2 (two) times daily. What changed:  when to take this   furosemide 40 MG tablet Commonly known as: LASIX Take 1.5 tablets (60 mg total) by mouth daily. What changed: when to take this   guaiFENesin 600 MG 12 hr tablet Commonly known as: MUCINEX Take 1 tablet (600 mg total) by mouth 2 (two) times daily.   metFORMIN 500 MG 24 hr tablet Commonly known as: GLUCOPHAGE-XR Take 500 mg by mouth daily with breakfast.   metoprolol tartrate 50 MG tablet Commonly known as: LOPRESSOR Take 50 mg by mouth 2 (two) times daily.   polyethylene  glycol 17 g packet Commonly known as: MIRALAX / GLYCOLAX Take 17 g by mouth daily as needed for mild constipation.   potassium chloride SA 20 MEQ tablet Commonly known as: K-DUR Take 20 mEq by mouth 2 (two) times daily.   albuterol (2.5 MG/3ML) 0.083% nebulizer solution Commonly known as: PROVENTIL Take 2.5 mg by nebulization every 6 (six) hours as needed for wheezing or shortness of breath.   ProAir HFA 108 (90 Base) MCG/ACT inhaler Generic drug: albuterol Inhale 2 puffs into the lungs every 6 (six) hours as needed for wheezing or shortness of breath.   QUEtiapine 25 MG tablet Commonly known as: SEROQUEL Take 1 tablet (25 mg total) by mouth daily at 6 PM.   rivaroxaban 20 MG Tabs tablet Commonly known as: Xarelto Take 1 tablet (20 mg total) by mouth daily with supper. Stop Xarelto until Monday (08/26/18) in anticipation for kyphoplasty   Trelegy Ellipta 100-62.5-25 MCG/INH Aepb Generic drug: Fluticasone-Umeclidin-Vilant Take 1 puff by mouth daily.      Follow-up Information    Lavella Lemons, PA Follow up in 1 week(s).   Specialty: Physician Assistant Contact information: [place] [address] [phone]        Herminio Commons, MD .   Specialty: Cardiology Contact information: [place] [address] [phone]          Allergies  Allergen Reactions  . Codeine Other (See Comments)    "jittery"    Consultations:  Palliative care   Procedures/Studies: Ct Head Wo Contrast  Result Date: 11/06/2018 CLINICAL DATA:  Altered level of consciousness EXAM: CT HEAD WITHOUT CONTRAST TECHNIQUE: Contiguous axial images were obtained from the base of the skull through the vertex without intravenous contrast. COMPARISON:  10/26/2018 FINDINGS: Brain: No acute intracranial abnormality. Specifically, no hemorrhage, hydrocephalus, mass lesion, acute infarction, or significant intracranial injury. Vascular: No hyperdense vessel or  unexpected calcification. Skull: No acute calvarial abnormality. Sinuses/Orbits: Visualized paranasal sinuses and mastoids clear. Orbital soft tissues unremarkable. Other: None IMPRESSION: No acute intracranial abnormality. Electronically Signed   By: Rolm Baptise M.D.   On: 11/06/2018 18:45   Ct Head Wo Contrast  Result Date: 10/26/2018 CLINICAL DATA:  Patient has had some episodes of agitation and confusion during this hospitalization which has worsened overnight. Daughter states that this is been ongoing for several weeks at home as well.Altered level of consciousness (LOC), unexplained EXAM: CT HEAD WITHOUT CONTRAST TECHNIQUE: Contiguous axial images were obtained from the base of the skull through the vertex without intravenous contrast. COMPARISON:  None. FINDINGS: Brain: No acute intracranial hemorrhage. No focal mass lesion. No CT evidence of acute infarction. No midline shift or mass effect. No hydrocephalus. Basilar cisterns are patent. Mild periventricular white matter hypodensities. Vascular: No hyperdense vessel or unexpected calcification. Skull: Normal. Negative for fracture or focal lesion. Sinuses/Orbits: Paranasal sinuses and mastoid air cells are clear. Orbits are clear.  Other: None. IMPRESSION: No acute intracranial findings. Electronically Signed   By: Suzy Bouchard M.D.   On: 10/26/2018 15:45   Mr Brain Wo Contrast  Result Date: 11/08/2018 CLINICAL DATA:  73 year old female with unexplained altered mental status for 2 days. Encephalopathy. EXAM: MRI HEAD WITHOUT CONTRAST TECHNIQUE: Multiplanar, multiecho pulse sequences of the brain and surrounding structures were obtained without intravenous contrast. COMPARISON:  Head CTs 11/06/2018 and earlier. FINDINGS: Brain: No restricted diffusion to suggest acute infarction. No midline shift, mass effect, evidence of mass lesion, ventriculomegaly, extra-axial collection or acute intracranial hemorrhage. Cervicomedullary junction and pituitary  are within normal limits. Axial T2 images are suboptimal due to motion, and the patient discontinued the exam before coronal T2 images could be obtained. However, gray and white matter signal is largely normal for age throughout the brain. There is mild nonspecific patchy mostly periventricular white matter T2 and FLAIR hyperintensity. No cortical encephalomalacia or chronic blood products identified. The deep gray nuclei, brainstem and cerebellum appear negative. Vascular: Major intracranial vascular flow voids are preserved. Skull and upper cervical spine: Negative visible cervical spine. Visualized bone marrow signal is within normal limits. Sinuses/Orbits: Motion artifact at the orbits which appear grossly negative. Paranasal sinuses are stable and well pneumatized. Other: There is a mild right mastoid effusion redemonstrated. Negative nasopharynx. Left mastoids are clear. Scalp and face soft tissues appear negative. IMPRESSION: 1. No acute intracranial abnormality, and largely negative for age non-contrast MRI appearance of the brain allowing for some motion degradation. 2. Mild right mastoid effusion, likely postinflammatory. Electronically Signed   By: Genevie Ann M.D.   On: 11/08/2018 15:32   Dg Chest Port 1 View  Result Date: 11/06/2018 CLINICAL DATA:  Shortness of breath EXAM: PORTABLE CHEST 1 VIEW COMPARISON:  October 23, 2018 FINDINGS: There is persistent airspace opacity in the left base. There is atelectatic change in the right base. Heart remains enlarged with mild pulmonary venous hypertension. No adenopathy. No bone lesions. IMPRESSION: Persistent airspace consolidation consistent with pneumonia left base. Atelectasis right base. Underlying cardiomegaly with a degree of pulmonary vascular congestion. Electronically Signed   By: Lowella Grip III M.D.   On: 11/06/2018 17:08   Dg Chest Port 1 View  Result Date: 10/23/2018 CLINICAL DATA:  Shortness of breath EXAM: PORTABLE CHEST 1 VIEW  COMPARISON:  October 18, 2018 FINDINGS: There is slight interval worsening in the patchy airspace opacities seen at both lung bases. There is mildly increased interstitial markings seen throughout both lungs. The cardiomediastinal silhouette is unchanged. No acute osseous abnormality. IMPRESSION: Slight interval worsening in the patchy airspace opacities in both lung bases which could be due to edema, atelectasis, and/or infectious etiology. Electronically Signed   By: Prudencio Pair M.D.   On: 10/23/2018 23:28   Dg Chest Portable 1 View  Result Date: 10/18/2018 CLINICAL DATA:  Shortness of breath. EXAM: PORTABLE CHEST 1 VIEW COMPARISON:  September 26, 2018 FINDINGS: Emphysematous changes identified. Opacity in the right base. Atelectasis in the left base. Stable cardiomegaly. No other changes. IMPRESSION: 1. Opacity in the right base could represent atelectasis or developing infiltrate. Recommend follow-up to resolution. Electronically Signed   By: Dorise Bullion III M.D   On: 10/18/2018 19:42     Discharge Exam: Vitals:   11/10/18 2030 11/11/18 0518  BP: 100/63 112/72  Pulse: 88 91  Resp: (!) 22 (!) 22  Temp: 98.7 F (37.1 C) 98.3 F (36.8 C)  SpO2: 100% 100%   Vitals:   11/10/18 1121 11/10/18  1402 11/10/18 2030 11/11/18 0518  BP:  100/72 100/63 112/72  Pulse:  94 88 91  Resp:  20 (!) 22 (!) 22  Temp:  98.1 F (36.7 C) 98.7 F (37.1 C) 98.3 F (36.8 C)  TempSrc:  Oral Oral Oral  SpO2:  94% 100% 100%  Weight: 67.6 kg     Height:        General: Pt is alert, awake, not in acute distress Cardiovascular: RRR, S1/S2 +, no rubs, no gallops Respiratory: CTA bilaterally, no wheezing, no rhonchi Abdominal: Soft, NT, ND, bowel sounds + Extremities: no edema, no cyanosis    The results of significant diagnostics from this hospitalization (including imaging, microbiology, ancillary and laboratory) are listed below for reference.     Microbiology: Recent Results (from the past 240 hour(s))   SARS Coronavirus 2 [facility] order, Performed in [facility] hospital lab) Nasopharyngeal Urine, Random     Status: None   Collection Time: 11/06/18  4:38 PM   Specimen: Urine, Random; Nasopharyngeal  Result Value Ref Range Status   SARS Coronavirus 2 NEGATIVE NEGATIVE Final    Comment: (NOTE) If result is NEGATIVE SARS-CoV-2 target nucleic acids are NOT DETECTED. The SARS-CoV-2 RNA is generally detectable in upper and lower  respiratory specimens during the acute phase of infection. The lowest  concentration of SARS-CoV-2 viral copies this assay can detect is 250  copies / mL. A negative result does not preclude SARS-CoV-2 infection  and should not be used as the sole basis for treatment or other  patient management decisions.  A negative result may occur with  improper specimen collection / handling, submission of specimen other  than nasopharyngeal swab, presence of viral mutation(s) within the  areas targeted by this assay, and inadequate number of viral copies  (<250 copies / mL). A negative result must be combined with clinical  observations, patient history, and epidemiological information. If result is POSITIVE SARS-CoV-2 target nucleic acids are DETECTED. The SARS-CoV-2 RNA is generally detectable in upper and lower  respiratory specimens dur ing the acute phase of infection.  Positive  results are indicative of active infection with SARS-CoV-2.  Clinical  correlation with patient history and other diagnostic information is  necessary to determine patient infection status.  Positive results do  not rule out bacterial infection or co-infection with other viruses. If result is PRESUMPTIVE POSTIVE SARS-CoV-2 nucleic acids MAY BE PRESENT.   A presumptive positive result was obtained on the submitted specimen  and confirmed on repeat testing.  While 2019 novel coronavirus  (SARS-CoV-2) nucleic acids may be present in the submitted sample  additional confirmatory testing may be  necessary for epidemiological  and / or clinical management purposes  to differentiate between  SARS-CoV-2 and other Sarbecovirus currently known to infect humans.  If clinically indicated additional testing with an alternate test  methodology [phone]) is advised. The SARS-CoV-2 RNA is generally  detectable in upper and lower respiratory sp ecimens during the acute  phase of infection. The expected result is Negative. Fact Sheet for Patients:  StrictlyIdeas.no Fact Sheet for Healthcare Providers: BankingDealers.co.za This test is not yet approved or cleared by the [place] FDA and has been authorized for detection and/or diagnosis of SARS-CoV-2 by FDA under an Emergency Use Authorization (EUA).  This EUA will remain in effect (meaning this test can be used) for the duration of the COVID-19 declaration under Section 564(b)(1) of the Act, 21 U.S.C. section 360bbb-3(b)(1), unless the authorization is terminated or revoked sooner. Performed  at [facility], [address], [place], [place] [address]   MRSA PCR Screening     Status: None   Collection Time: 11/06/18 11:00 PM   Specimen: Nasal Mucosa; Nasopharyngeal  Result Value Ref Range Status   MRSA by PCR NEGATIVE NEGATIVE Final    Comment:        The GeneXpert MRSA Assay (FDA approved for NASAL specimens only), is one component of a comprehensive MRSA colonization surveillance program. It is not intended to diagnose MRSA infection nor to guide or monitor treatment for MRSA infections. Performed at [facility], [address], [place], [place] [address]      Labs: BNP (last 3 results) Recent Labs    08/20/18 1228 09/26/18 1419 10/24/18 0000  BNP 889.0* 302.0* [id number]*   Basic Metabolic Panel: Recent Labs  Lab 11/06/18 1648 11/07/18 [id number] 11/08/18 0448 11/09/18 0431 11/10/18 0503 11/11/18 0448  NA  --  142 143 142 141 142  K  --  5.9* 5.0 4.1 3.1* 4.0  CL  --   94* 91* 85* 85* 89*  CO2  --  36* 38* 36* 41* 38*  GLUCOSE  --  218* 101* 67* 118* 90  BUN  --  19 19 16 16 13   CREATININE  --  1.05* 0.82 0.83 0.84 0.71  CALCIUM  --  9.1 8.8* 9.5 9.0 9.4  MG 2.0  --   --   --   --  1.8  PHOS 3.7  --   --   --   --   --    Liver Function Tests: Recent Labs  Lab 11/06/18 1623 11/07/18 0638  AST 24 82*  ALT 26 70*  ALKPHOS 89 103  BILITOT 0.6 0.7  PROT 6.8 6.8  ALBUMIN 3.8 3.6   No results for input(s): LIPASE, AMYLASE in the last 168 hours. No results for input(s): AMMONIA in the last 168 hours. CBC: Recent Labs  Lab 11/06/18 1623  11/08/18 0448 11/09/18 0431 11/10/18 0021 11/10/18 0503 11/11/18 0448  WBC 14.9*   < > 13.5* 10.9* 8.0 6.9 8.4  NEUTROABS 12.3*  --   --   --  5.6  --   --   HGB 10.7*   < > 10.8* 11.9* 11.0* 11.6* 12.0  HCT 39.0   < > 37.2 42.5 38.6 41.0 43.7  MCV 101.6*   < > 97.4 100.5* 98.5 98.8 100.0  PLT 307   < > 203 257 223 263 260   < > = values in this interval not displayed.   Cardiac Enzymes: No results for input(s): CKTOTAL, CKMB, CKMBINDEX, TROPONINI in the last 168 hours. BNP: Invalid input(s): POCBNP CBG: Recent Labs  Lab 11/10/18 0733 11/10/18 1119 11/10/18 1611 11/10/18 2133 11/11/18 0749  GLUCAP 123* 232* 79 123* 93   D-Dimer No results for input(s): DDIMER in the last 72 hours. Hgb A1c No results for input(s): HGBA1C in the last 72 hours. Lipid Profile No results for input(s): CHOL, HDL, LDLCALC, TRIG, CHOLHDL, LDLDIRECT in the last 72 hours. Thyroid function studies No results for input(s): TSH, T4TOTAL, T3FREE, THYROIDAB in the last 72 hours.  Invalid input(s): FREET3 Anemia work up No results for input(s): VITAMINB12, FOLATE, FERRITIN, TIBC, IRON, RETICCTPCT in the last 72 hours. Urinalysis    Component Value Date/Time   COLORURINE YELLOW 11/06/2018 1638   APPEARANCEUR CLEAR 11/06/2018 1638   LABSPEC 1.009 11/06/2018 1638   PHURINE 5.0 11/06/2018 1638   [place]  11/06/2018 1638  [place] NEGATIVE 11/06/2018 [place] 11/06/2018 [place] 11/06/2018 1638   PROTEINUR 30 (A) 11/06/2018 1638   UROBILINOGEN 0.2 09/09/2009 1010   NITRITE POSITIVE (A) 11/06/2018 1638   LEUKOCYTESUR TRACE (A) 11/06/2018 1638   Sepsis Labs Invalid input(s): PROCALCITONIN,  WBC,  LACTICIDVEN Microbiology Recent Results (from the past 240 hour(s))  SARS Coronavirus 2 [facility] order, Performed in [facility] hospital lab) Nasopharyngeal Urine, Random     Status: None   Collection Time: 11/06/18  4:38 PM   Specimen: Urine, Random; Nasopharyngeal  Result Value Ref Range Status   SARS Coronavirus 2 NEGATIVE NEGATIVE Final    Comment: (NOTE) If result is NEGATIVE SARS-CoV-2 target nucleic acids are NOT DETECTED. The SARS-CoV-2 RNA is generally detectable in upper and lower  respiratory specimens during the acute phase of infection. The lowest  concentration of SARS-CoV-2 viral copies this assay can detect is 250  copies / mL. A negative result does not preclude SARS-CoV-2 infection  and should not be used as the sole basis for treatment or other  patient management decisions.  A negative result may occur with  improper specimen collection / handling, submission of specimen other  than nasopharyngeal swab, presence of viral mutation(s) within the  areas targeted by this assay, and inadequate number of viral copies  (<250 copies / mL). A negative result must be combined with clinical  observations, patient history, and epidemiological information. If result is POSITIVE SARS-CoV-2 target nucleic acids are DETECTED. The SARS-CoV-2 RNA is generally detectable in upper and lower  respiratory specimens dur ing the acute phase of infection.  Positive  results are indicative of active infection with SARS-CoV-2.  Clinical  correlation with patient history and other diagnostic information is  necessary to determine patient infection status.   Positive results do  not rule out bacterial infection or co-infection with other viruses. If result is PRESUMPTIVE POSTIVE SARS-CoV-2 nucleic acids MAY BE PRESENT.   A presumptive positive result was obtained on the submitted specimen  and confirmed on repeat testing.  While 2019 novel coronavirus  (SARS-CoV-2) nucleic acids may be present in the submitted sample  additional confirmatory testing may be necessary for epidemiological  and / or clinical management purposes  to differentiate between  SARS-CoV-2 and other Sarbecovirus currently known to infect humans.  If clinically indicated additional testing with an alternate test  methodology [phone]) is advised. The SARS-CoV-2 RNA is generally  detectable in upper and lower respiratory sp ecimens during the acute  phase of infection. The expected result is Negative. Fact Sheet for Patients:  StrictlyIdeas.no Fact Sheet for Healthcare Providers: BankingDealers.co.za This test is not yet approved or cleared by the [place] FDA and has been authorized for detection and/or diagnosis of SARS-CoV-2 by FDA under an Emergency Use Authorization (EUA).  This EUA will remain in effect (meaning this test can be used) for the duration of the COVID-19 declaration under Section 564(b)(1) of the Act, 21 U.S.C. section 360bbb-3(b)(1), unless the authorization is terminated or revoked sooner. Performed at [facility], [address] [place], [place] [address]   MRSA PCR Screening     Status: None   Collection Time: 11/06/18 11:00 PM   Specimen: Nasal Mucosa; Nasopharyngeal  Result Value Ref Range Status   MRSA by PCR NEGATIVE NEGATIVE Final    Comment:        The GeneXpert MRSA Assay (FDA approved for NASAL specimens only), is one component of a comprehensive MRSA colonization  surveillance program. It is not intended to diagnose MRSA infection nor to guide or monitor treatment for MRSA  infections. Performed at [facility] [address], [place], [place] [address]      Time coordinating discharge: 35 minutes  SIGNED:   Rodena Goldmann, DO [facility] 11/11/2018, 8:51 AM  If 7PM-7AM, please contact night-coverage [email or web address] Password [email or web address]

## 2018-11-13 ENCOUNTER — Other Ambulatory Visit: Payer: Self-pay | Admitting: *Deleted

## 2018-11-13 ENCOUNTER — Telehealth: Payer: Self-pay | Admitting: Cardiovascular Disease

## 2018-11-13 NOTE — Patient Outreach (Signed)
Outreach call to pt for telephone assessment/ post hospital follow up, Primary care provider completes transition of care, no answer to telephone, left voicemail requesting return phone call, mailed unsuccessful outreach letter to pt home.    PLAN Outreach pt in 3-4 business days  Jacqlyn Larsen [facility], [place] [phone]

## 2018-11-13 NOTE — Telephone Encounter (Signed)
Virtual Visit Pre-Appointment Phone Call  "(Name), I am calling you today to discuss your upcoming appointment. We are currently trying to limit exposure to the virus that causes COVID-19 by seeing patients at home rather than in the office."  1. "What is the BEST phone number to call the day of the visit?" - include this in appointment notes  2. Do you have or have access to (through a family member/friend) a smartphone with video capability that we can use for your visit?" a. If yes - list this number in appt notes as cell (if different from BEST phone #) and list the appointment type as a VIDEO visit in appointment notes b. If no - list the appointment type as a PHONE visit in appointment notes  3. Confirm consent - "In the setting of the current Covid19 crisis, you are scheduled for a (phone or video) visit with your provider on (date) at (time).  Just as we do with many in-office visits, in order for you to participate in this visit, we must obtain consent.  If you'd like, I can send this to your mychart (if signed up) or email for you to review.  Otherwise, I can obtain your verbal consent now.  All virtual visits are billed to your insurance company just like a normal visit would be.  By agreeing to a virtual visit, we'd like you to understand that the technology does not allow for your provider to perform an examination, and thus may limit your provider's ability to fully assess your condition. If your provider identifies any concerns that need to be evaluated in person, we will make arrangements to do so.  Finally, though the technology is pretty good, we cannot assure that it will always work on either your or our end, and in the setting of a video visit, we may have to convert it to a phone-only visit.  In either situation, we cannot ensure that we have a secure connection.  Are you willing to proceed?" STAFF: Did the patient verbally acknowledge consent to telehealth visit? Document  YES/NO here: YES   4. Advise patient to be prepared - "Two hours prior to your appointment, go ahead and check your blood pressure, pulse, oxygen saturation, and your weight (if you have the equipment to check those) and write them all down. When your visit starts, your provider will ask you for this information. If you have an Apple Watch or Kardia device, please plan to have heart rate information ready on the day of your appointment. Please have a pen and paper handy nearby the day of the visit as well."  5. Give patient instructions for MyChart download to smartphone OR Doximity/Doxy.me as below if video visit (depending on what platform provider is using)  6. Inform patient they will receive a phone call 15 minutes prior to their appointment time (may be from unknown caller ID) so they should be prepared to answer    TELEPHONE CALL NOTE  Eileen [name] has been deemed a candidate for a follow-up tele-health visit to limit community exposure during the Covid-19 pandemic. I spoke with the patient via phone to ensure availability of phone/video source, confirm preferred email & phone number, and discuss instructions and expectations.  I reminded Eileen [name] to be prepared with any vital sign and/or heart rhythm information that could potentially be obtained via home monitoring, at the time of her visit. I reminded Eileen [name] to expect a phone call prior  to her visit.  Chanda Busing 11/13/2018 3:59 PM   INSTRUCTIONS FOR DOWNLOADING THE MYCHART APP TO SMARTPHONE  - The patient must first make sure to have activated MyChart and know their login information - If Apple, go to [employer] and type in MyChart in the search bar and download the app. If Android, ask patient to go to [employer] and type in [place] in the search bar and download the app. The app is free but as with any other app downloads, their phone may require them to verify saved payment information or  Apple/Android password.  - The patient will need to then log into the app with their MyChart username and password, and select [facility] as their healthcare provider to link the account. When it is time for your visit, go to the MyChart app, find appointments, and click Begin Video Visit. Be sure to Select Allow for your device to access the Microphone and Camera for your visit. You will then be connected, and your provider will be with you shortly.  **If they have any issues connecting, or need assistance please contact MyChart service desk [phone])**  **If using a computer, in order to ensure the best quality for their visit they will need to use either of the following Internet Browsers: [employer], or Google Chrome**  IF USING DOXIMITY or DOXY.ME - The patient will receive a link just prior to their visit by text.     FULL LENGTH CONSENT FOR TELE-HEALTH VISIT   I hereby voluntarily request, consent and authorize [facility] and its employed or contracted physicians, physician assistants, nurse practitioners or other licensed health care professionals (the Practitioner), to provide me with telemedicine health care services (the Services") as deemed necessary by the treating Practitioner. I acknowledge and consent to receive the Services by the Practitioner via telemedicine. I understand that the telemedicine visit will involve communicating with the Practitioner through live audiovisual communication technology and the disclosure of certain medical information by electronic transmission. I acknowledge that I have been given the opportunity to request an in-person assessment or other available alternative prior to the telemedicine visit and am voluntarily participating in the telemedicine visit.  I understand that I have the right to withhold or withdraw my consent to the use of telemedicine in the course of my care at any time, without affecting my right to future care  or treatment, and that the Practitioner or I may terminate the telemedicine visit at any time. I understand that I have the right to inspect all information obtained and/or recorded in the course of the telemedicine visit and may receive copies of available information for a reasonable fee.  I understand that some of the potential risks of receiving the Services via telemedicine include:   Delay or interruption in medical evaluation due to technological equipment failure or disruption;  Information transmitted may not be sufficient (e.g. poor resolution of images) to allow for appropriate medical decision making by the Practitioner; and/or   In rare instances, security protocols could fail, causing a breach of personal health information.  Furthermore, I acknowledge that it is my responsibility to provide information about my medical history, conditions and care that is complete and accurate to the best of my ability. I acknowledge that Practitioner's advice, recommendations, and/or decision may be based on factors not within their control, such as incomplete or inaccurate data provided by me or distortions of diagnostic images or specimens that may result from electronic transmissions.  I understand that the practice of medicine is not an exact science and that Practitioner makes no warranties or guarantees regarding treatment outcomes. I acknowledge that I will receive a copy of this consent concurrently upon execution via email to the email address I last provided but may also request a printed copy by calling the office of [facility].    I understand that my insurance will be billed for this visit.   I have read or had this consent read to me.  I understand the contents of this consent, which adequately explains the benefits and risks of the Services being provided via telemedicine.   I have been provided ample opportunity to ask questions regarding this consent and the Services and have had  my questions answered to my satisfaction.  I give my informed consent for the services to be provided through the use of telemedicine in my medical care  By participating in this telemedicine visit I agree to the above.

## 2018-11-14 ENCOUNTER — Encounter: Payer: Self-pay | Admitting: Cardiovascular Disease

## 2018-11-14 ENCOUNTER — Telehealth (INDEPENDENT_AMBULATORY_CARE_PROVIDER_SITE_OTHER): Payer: Medicare Other | Admitting: Cardiovascular Disease

## 2018-11-14 ENCOUNTER — Other Ambulatory Visit: Payer: Self-pay

## 2018-11-14 VITALS — BP 110/80 | HR 89 | Ht 67.0 in | Wt 167.0 lb

## 2018-11-14 DIAGNOSIS — J439 Emphysema, unspecified: Secondary | ICD-10-CM | POA: Diagnosis not present

## 2018-11-14 DIAGNOSIS — J9610 Chronic respiratory failure, unspecified whether with hypoxia or hypercapnia: Secondary | ICD-10-CM | POA: Diagnosis not present

## 2018-11-14 DIAGNOSIS — I10 Essential (primary) hypertension: Secondary | ICD-10-CM

## 2018-11-14 DIAGNOSIS — I4892 Unspecified atrial flutter: Secondary | ICD-10-CM

## 2018-11-14 DIAGNOSIS — J9611 Chronic respiratory failure with hypoxia: Secondary | ICD-10-CM

## 2018-11-14 DIAGNOSIS — J690 Pneumonitis due to inhalation of food and vomit: Secondary | ICD-10-CM | POA: Diagnosis not present

## 2018-11-14 DIAGNOSIS — J9601 Acute respiratory failure with hypoxia: Secondary | ICD-10-CM | POA: Diagnosis not present

## 2018-11-14 DIAGNOSIS — J9602 Acute respiratory failure with hypercapnia: Secondary | ICD-10-CM | POA: Diagnosis not present

## 2018-11-14 DIAGNOSIS — R600 Localized edema: Secondary | ICD-10-CM

## 2018-11-14 NOTE — Addendum Note (Signed)
Addended by: Barbarann Ehlers A on: 11/14/2018 02:01 PM   Modules accepted: Orders

## 2018-11-14 NOTE — Patient Instructions (Signed)
Medication Instructions: STOP Amiodarone  Labwork: None  Procedures/Testing: None  Follow-Up:  6 months with Dr.Koneswaran  Any Additional Special Instructions Will Be Listed Below (If Applicable).     If you need a refill on your cardiac medications before your next appointment, please call your pharmacy.     Thank you for choosing [facility] !

## 2018-11-14 NOTE — Progress Notes (Signed)
Virtual Visit via Telephone Note   This visit type was conducted due to national recommendations for restrictions regarding the COVID-19 Pandemic (e.g. social distancing) in an effort to limit this patient's exposure and mitigate transmission in our community.  Due to her co-morbid illnesses, this patient is at least at moderate risk for complications without adequate follow up.  This format is felt to be most appropriate for this patient at this time.  The patient did not have access to video technology/had technical difficulties with video requiring transitioning to audio format only (telephone).  All issues noted in this document were discussed and addressed.  No physical exam could be performed with this format.  Please refer to the patient's chart for her  consent to telehealth for [facility].   Date:  11/14/2018   ID:  Eileen A Harkins, DOB 12/15/1945, MRN [id number]  Patient Location: Home Provider Location: Office  PCP:  Lavella Lemons, PA  Cardiologist:  Kate Sable, MD  Electrophysiologist:  None   Evaluation Performed:  Follow-Up Visit  Chief Complaint:  PAF  History of Present Illness:    Eileen [name] is a 73 y.o. female with past medical history of PAF (on Xarelto), HTN, Type 2 DM, dementia and COPD.  She was hospitalized for a COPD exacerbation in early August.  She was hospitalized again in late August for what was felt to be a recurrence of pneumonia.  She is gradually improving. She denies chest pain and palpitations. Shortness of breath is improving.  The patient does not have symptoms concerning for COVID-19 infection (fever, chills).    Past Medical History:  Diagnosis Date  . Asthma   . Atrial flutter ([place])   . Cancer [facility])    Right breast  . COPD (chronic obstructive pulmonary disease) ([place])   . History of breast cancer    right breast  . Hypertension   . On home O2   . Type 2 diabetes mellitus ([place])    Past Surgical History:   Procedure Laterality Date  . BREAST SURGERY  2011   Right breast mastectomy  . CARDIOVERSION N/A 06/27/2018   Procedure: CARDIOVERSION;  Surgeon: Arnoldo Lenis, MD;  [facility];  Service: Endoscopy;  Laterality: N/A;  . CESAREAN SECTION    . COLONOSCOPY N/A 01/22/2018   Procedure: COLONOSCOPY;  Surgeon: Rogene Houston, MD;  Location: [facility];  Service: Endoscopy;  Laterality: N/A;  . HERNIA REPAIR     RIH  . IR VERTEBROPLASTY LUMBAR BX INC UNI/BIL INC/INJECT/IMAGING  09/05/2018  . MASTECTOMY  2011   right breast  . TEE WITHOUT CARDIOVERSION N/A 06/27/2018   Procedure: TRANSESOPHAGEAL ECHOCARDIOGRAM (TEE) WITH PROPOFOL;  Surgeon: Arnoldo Lenis, MD;  Location: [facility];  Service: Endoscopy;  Laterality: N/A;     Current Meds  Medication Sig  . acetaminophen (TYLENOL) 500 MG tablet Take 500 mg by mouth every 8 (eight) hours as needed for mild pain or headache.   . albuterol (PROVENTIL) (2.5 MG/3ML) 0.083% nebulizer solution Take 2.5 mg by nebulization every 6 (six) hours as needed for wheezing or shortness of breath.  Marland Kitchen amiodarone (PACERONE) 200 MG tablet Take 1 tablet (200 mg total) by mouth daily.  Mar[place] Kitchen docusate sodium (COLACE) 100 MG capsule Take 1 capsule (100 mg total) by mouth 2 (two) times daily. (Patient taking differently: Take 100 mg by mouth daily. )  . furosemide (LASIX) 40 MG tablet Take 1.5 tablets (60 mg total) by mouth  daily.  . guaiFENesin (MUCINEX) 600 MG 12 hr tablet Take 1 tablet (600 mg total) by mouth 2 (two) times daily.  . metFORMIN (GLUCOPHAGE-XR) 500 MG 24 hr tablet Take 500 mg by mouth daily with breakfast.   . metoprolol tartrate (LOPRESSOR) 50 MG tablet Take 50 mg by mouth 2 (two) times daily.  . polyethylene glycol (MIRALAX / GLYCOLAX) 17 g packet Take 17 g by mouth daily as needed for mild constipation.  . potassium chloride SA (K-DUR) 20 MEQ tablet Take 20 mEq by mouth 2 (two) times daily.  [place] Kitchen PROAIR HFA 108 (90 BASE) MCG/ACT  inhaler Inhale 2 puffs into the lungs every 6 (six) hours as needed for wheezing or shortness of breath.   . QUEtiapine (SEROQUEL) 25 MG tablet Take 1 tablet (25 mg total) by mouth daily at 6 PM.  . rivaroxaban (XARELTO) 20 MG TABS tablet Take 1 tablet (20 mg total) by mouth daily with supper. Stop Xarelto until Monday (08/26/18) in anticipation for kyphoplasty  . TRELEGY ELLIPTA 100-62.5-25 MCG/INH AEPB Take 1 puff by mouth daily.     Allergies:   Codeine   Social History   Tobacco Use  . Smoking status: Former Smoker    Packs/day: 0.50    Years: 32.00    Pack years: 16.00    Types: Cigarettes    Start date: 12/12/1962    Quit date: 03/13/1994    Years since quitting: 24.6  . Smokeless tobacco: Never Used  Substance Use Topics  . Alcohol use: No    Alcohol/week: 0.0 standard drinks  . Drug use: No     Family Hx: The patient's family history includes Cancer in her mother; Diabetes in her brother.  ROS:   Please see the history of present illness.     All other systems reviewed and are negative.   Prior CV studies:   The following studies were reviewed today:  TEE: 06/2018 IMPRESSIONS  1. Left atrial size was moderately dilated. 2. No evidence of a thrombus present in the left atrial appendage. 3. Right atrial size was moderately dilated. 4. Mitral valve regurgitation is moderate by color flow Doppler. No evidence of mitral valve stenosis. 5. The tricuspid valve was not well visualized. Tricuspid valve regurgitation is moderate. 6. The aortic valve is tricuspid Aortic valve regurgitation is mild by color flow Doppler. No stenosis of the aortic valve. 7. Limited focused study due to hypoxia during study. 8. The left ventricle has low normal systolic function, with an ejection fraction of 50-55%. 9. Would recommend TTE to better evaluate MR and TR after COVID-19 limitations have passed, would also be helpful to evaluate LVEF with aflutter resolved. Limited images  taken during this study due to hypoxia during study, MR and TR appear at least  moderate.  Labs/Other Tests and Data Reviewed:    EKG:  An ECG dated 11/06/18 was personally reviewed today and demonstrated:  Sinus rhythm with PACs  Recent Labs: 10/24/2018: B Natriuretic Peptide 318.0 10/26/2018: TSH 0.831 11/07/2018: ALT 70 11/11/2018: BUN 13; Creatinine, Ser 0.71; Hemoglobin 12.0; Magnesium 1.8; Platelets 260; Potassium 4.0; Sodium 142   Recent Lipid Panel Lab Results  Component Value Date/Time   CHOL 204 (H) 03/23/2015 04:20 AM   TRIG 86 03/23/2015 04:20 AM   HDL 85 03/23/2015 04:20 AM   CHOLHDL 2.4 03/23/2015 04:20 AM   LDLCALC 102 (H) 03/23/2015 04:20 AM    Wt Readings from Last 3 Encounters:  11/14/18 167 lb (75.8 kg)  11/10/18 149  lb 0.5 oz (67.6 kg)  10/27/18 164 lb 0.4 oz (74.4 kg)     Objective:    Vital Signs:  BP 110/80   Pulse 89   Ht 5\' 7"  (1.702 m)   Wt 167 lb (75.8 kg)   BMI 26.16 kg/m    VITAL SIGNS:  reviewed  ASSESSMENT & PLAN:    1.  Paroxysmal atrial flutter: Diagnosed in April 2020 and underwent successful TEE guided direct-current cardioversion on 06/27/2018.  Currently on amiodarone 200 mg daily along with Lopressor 50 mg twice daily.  Given her oxygen dependent COPD, she is not an ideal candidate for amiodarone.  I will discontinue this.  She is on Xarelto for anticoagulation.  2.  Hypertension: BP normal. No changes.  3.  Lower extremity edema: Now on Lasix 60 mg daily.  He has been recommended for her to elevate legs whenever possible and to use compression stockings.  4.  COPD: She is on 3.5-4 L nasal cannula at baseline and follows with Dr. Luan Pulling.    COVID-19 Education: The signs and symptoms of COVID-19 were discussed with the patient and how to seek care for testing (follow up with PCP or arrange E-visit).  The importance of social distancing was discussed today.  Time:   Today, I have spent 15 minutes with the patient with telehealth  technology discussing the above problems.     Medication Adjustments/Labs and Tests Ordered: Current medicines are reviewed at length with the patient today.  Concerns regarding medicines are outlined above.   Tests Ordered: No orders of the defined types were placed in this encounter.   Medication Changes: No orders of the defined types were placed in this encounter.   Follow Up:  Virtual Visit or In Person in 6 month(s)  Signed, Kate Sable, MD  11/14/2018 11:41 AM    [facility]

## 2018-11-15 ENCOUNTER — Inpatient Hospital Stay (HOSPITAL_COMMUNITY)
Admission: EM | Admit: 2018-11-15 | Discharge: 2018-11-22 | DRG: 190 | Disposition: A | Discharge status: Home / Self Care | Payer: Medicare Other | Attending: Internal Medicine | Admitting: Internal Medicine

## 2018-11-15 ENCOUNTER — Encounter (HOSPITAL_COMMUNITY): Payer: Self-pay

## 2018-11-15 ENCOUNTER — Emergency Department (HOSPITAL_COMMUNITY): Payer: Medicare Other

## 2018-11-15 ENCOUNTER — Other Ambulatory Visit: Payer: Self-pay

## 2018-11-15 ENCOUNTER — Other Ambulatory Visit: Payer: Self-pay | Admitting: *Deleted

## 2018-11-15 DIAGNOSIS — J441 Chronic obstructive pulmonary disease with (acute) exacerbation: Secondary | ICD-10-CM | POA: Diagnosis not present

## 2018-11-15 DIAGNOSIS — E119 Type 2 diabetes mellitus without complications: Secondary | ICD-10-CM

## 2018-11-15 DIAGNOSIS — I5033 Acute on chronic diastolic (congestive) heart failure: Secondary | ICD-10-CM | POA: Diagnosis present

## 2018-11-15 DIAGNOSIS — E1169 Type 2 diabetes mellitus with other specified complication: Secondary | ICD-10-CM | POA: Diagnosis not present

## 2018-11-15 DIAGNOSIS — M4856XA Collapsed vertebra, not elsewhere classified, lumbar region, initial encounter for fracture: Secondary | ICD-10-CM | POA: Diagnosis not present

## 2018-11-15 DIAGNOSIS — R0689 Other abnormalities of breathing: Secondary | ICD-10-CM | POA: Diagnosis not present

## 2018-11-15 DIAGNOSIS — T17908A Unspecified foreign body in respiratory tract, part unspecified causing other injury, initial encounter: Secondary | ICD-10-CM | POA: Diagnosis not present

## 2018-11-15 DIAGNOSIS — I110 Hypertensive heart disease with heart failure: Secondary | ICD-10-CM | POA: Diagnosis not present

## 2018-11-15 DIAGNOSIS — J9690 Respiratory failure, unspecified, unspecified whether with hypoxia or hypercapnia: Secondary | ICD-10-CM

## 2018-11-15 DIAGNOSIS — R1310 Dysphagia, unspecified: Secondary | ICD-10-CM

## 2018-11-15 DIAGNOSIS — Z87891 Personal history of nicotine dependence: Secondary | ICD-10-CM

## 2018-11-15 DIAGNOSIS — R000 Tachycardia, unspecified: Secondary | ICD-10-CM | POA: Diagnosis not present

## 2018-11-15 DIAGNOSIS — M810 Age-related osteoporosis without current pathological fracture: Secondary | ICD-10-CM | POA: Diagnosis present

## 2018-11-15 DIAGNOSIS — I4891 Unspecified atrial fibrillation: Secondary | ICD-10-CM | POA: Diagnosis not present

## 2018-11-15 DIAGNOSIS — J9602 Acute respiratory failure with hypercapnia: Secondary | ICD-10-CM | POA: Diagnosis present

## 2018-11-15 DIAGNOSIS — R230 Cyanosis: Secondary | ICD-10-CM | POA: Diagnosis not present

## 2018-11-15 DIAGNOSIS — Z7901 Long term (current) use of anticoagulants: Secondary | ICD-10-CM

## 2018-11-15 DIAGNOSIS — I4892 Unspecified atrial flutter: Secondary | ICD-10-CM | POA: Diagnosis not present

## 2018-11-15 DIAGNOSIS — R0603 Acute respiratory distress: Secondary | ICD-10-CM | POA: Diagnosis not present

## 2018-11-15 DIAGNOSIS — J9811 Atelectasis: Secondary | ICD-10-CM | POA: Diagnosis present

## 2018-11-15 DIAGNOSIS — J9621 Acute and chronic respiratory failure with hypoxia: Secondary | ICD-10-CM | POA: Diagnosis not present

## 2018-11-15 DIAGNOSIS — E1165 Type 2 diabetes mellitus with hyperglycemia: Secondary | ICD-10-CM | POA: Diagnosis present

## 2018-11-15 DIAGNOSIS — J690 Pneumonitis due to inhalation of food and vomit: Secondary | ICD-10-CM | POA: Diagnosis not present

## 2018-11-15 DIAGNOSIS — Z853 Personal history of malignant neoplasm of breast: Secondary | ICD-10-CM | POA: Diagnosis not present

## 2018-11-15 DIAGNOSIS — T380X5A Adverse effect of glucocorticoids and synthetic analogues, initial encounter: Secondary | ICD-10-CM | POA: Diagnosis present

## 2018-11-15 DIAGNOSIS — Z9011 Acquired absence of right breast and nipple: Secondary | ICD-10-CM | POA: Diagnosis not present

## 2018-11-15 DIAGNOSIS — Z7984 Long term (current) use of oral hypoglycemic drugs: Secondary | ICD-10-CM

## 2018-11-15 DIAGNOSIS — I361 Nonrheumatic tricuspid (valve) insufficiency: Secondary | ICD-10-CM | POA: Diagnosis not present

## 2018-11-15 DIAGNOSIS — F05 Delirium due to known physiological condition: Secondary | ICD-10-CM | POA: Diagnosis present

## 2018-11-15 DIAGNOSIS — R069 Unspecified abnormalities of breathing: Secondary | ICD-10-CM | POA: Diagnosis not present

## 2018-11-15 DIAGNOSIS — Z20828 Contact with and (suspected) exposure to other viral communicable diseases: Secondary | ICD-10-CM | POA: Diagnosis present

## 2018-11-15 DIAGNOSIS — I248 Other forms of acute ischemic heart disease: Secondary | ICD-10-CM | POA: Diagnosis not present

## 2018-11-15 DIAGNOSIS — Z7189 Other specified counseling: Secondary | ICD-10-CM | POA: Diagnosis not present

## 2018-11-15 DIAGNOSIS — Z515 Encounter for palliative care: Secondary | ICD-10-CM | POA: Diagnosis present

## 2018-11-15 DIAGNOSIS — J9622 Acute and chronic respiratory failure with hypercapnia: Secondary | ICD-10-CM | POA: Diagnosis not present

## 2018-11-15 DIAGNOSIS — J9601 Acute respiratory failure with hypoxia: Secondary | ICD-10-CM

## 2018-11-15 DIAGNOSIS — E875 Hyperkalemia: Secondary | ICD-10-CM | POA: Diagnosis not present

## 2018-11-15 DIAGNOSIS — Z833 Family history of diabetes mellitus: Secondary | ICD-10-CM

## 2018-11-15 DIAGNOSIS — F0390 Unspecified dementia without behavioral disturbance: Secondary | ICD-10-CM | POA: Diagnosis present

## 2018-11-15 DIAGNOSIS — Z79899 Other long term (current) drug therapy: Secondary | ICD-10-CM | POA: Diagnosis not present

## 2018-11-15 DIAGNOSIS — Z9981 Dependence on supplemental oxygen: Secondary | ICD-10-CM | POA: Diagnosis not present

## 2018-11-15 DIAGNOSIS — Z885 Allergy status to narcotic agent status: Secondary | ICD-10-CM

## 2018-11-15 HISTORY — DX: Unspecified dementia, unspecified severity, without behavioral disturbance, psychotic disturbance, mood disturbance, and anxiety: F03.90

## 2018-11-15 LAB — COMPREHENSIVE METABOLIC PANEL
ALT: 31 U/L (ref 0–44)
AST: 34 U/L (ref 15–41)
Albumin: 3.5 g/dL (ref 3.5–5.0)
Alkaline Phosphatase: 90 U/L (ref 38–126)
Anion gap: 15 (ref 5–15)
BUN: 20 mg/dL (ref 8–23)
CO2: 38 mmol/L — ABNORMAL HIGH (ref 22–32)
Calcium: 9.9 mg/dL (ref 8.9–10.3)
Chloride: 86 mmol/L — ABNORMAL LOW (ref 98–111)
Creatinine, Ser: 1.21 mg/dL — ABNORMAL HIGH (ref 0.44–1.00)
GFR calc Af Amer: 51 mL/min — ABNORMAL LOW (ref >60)
GFR calc non Af Amer: 44 mL/min — ABNORMAL LOW (ref >60)
Glucose, Bld: 274 mg/dL — ABNORMAL HIGH (ref 70–99)
Potassium: 5.2 mmol/L — ABNORMAL HIGH (ref 3.5–5.1)
Sodium: 139 mmol/L (ref 135–145)
Total Bilirubin: 0.3 mg/dL (ref 0.3–1.2)
Total Protein: 7.1 g/dL (ref 6.5–8.1)

## 2018-11-15 LAB — CBC WITH DIFFERENTIAL/PLATELET
Abs Immature Granulocytes: 0.13 10*3/uL — ABNORMAL HIGH (ref 0.00–0.07)
Basophils Absolute: 0.1 10*3/uL (ref 0.0–0.1)
Basophils Relative: 0 %
Eosinophils Absolute: 0.1 10*3/uL (ref 0.0–0.5)
Eosinophils Relative: 1 %
HCT: 39.1 % (ref 36.0–46.0)
Hemoglobin: 10.6 g/dL — ABNORMAL LOW (ref 12.0–15.0)
Immature Granulocytes: 1 %
Lymphocytes Relative: 10 %
Lymphs Abs: 1.3 10*3/uL (ref 0.7–4.0)
MCH: 27.6 pg (ref 26.0–34.0)
MCHC: 27.1 g/dL — ABNORMAL LOW (ref 30.0–36.0)
MCV: 101.8 fL — ABNORMAL HIGH (ref 80.0–100.0)
Monocytes Absolute: 1.6 10*3/uL — ABNORMAL HIGH (ref 0.1–1.0)
Monocytes Relative: 13 %
Neutro Abs: 9.6 10*3/uL — ABNORMAL HIGH (ref 1.7–7.7)
Neutrophils Relative %: 75 %
Platelets: 414 10*3/uL — ABNORMAL HIGH (ref 150–400)
RBC: 3.84 MIL/uL — ABNORMAL LOW (ref 3.87–5.11)
RDW: 14.2 % (ref 11.5–15.5)
WBC: 12.8 10*3/uL — ABNORMAL HIGH (ref 4.0–10.5)
nRBC: 0.2 % (ref 0.0–0.2)

## 2018-11-15 MED ORDER — METHYLPREDNISOLONE SODIUM SUCC 125 MG IJ SOLR
125.0000 mg | Freq: Once | INTRAMUSCULAR | Status: AC
  Administered 2018-11-16: 125 mg via INTRAVENOUS
  Filled 2018-11-15: qty 2

## 2018-11-15 MED ORDER — IPRATROPIUM-ALBUTEROL 0.5-2.5 (3) MG/3ML IN SOLN
3.0000 mL | RESPIRATORY_TRACT | Status: AC
  Administered 2018-11-15 (×3): 3 mL via RESPIRATORY_TRACT
  Filled 2018-11-15: qty 3

## 2018-11-15 NOTE — Patient Outreach (Signed)
Outreach call to [facility] for collaboration, spoke with [place] who reports their RN saw pt yesterday 11/14/18 and reports pt does have all her medications including all inhalers and has xarelto, PT is to see pt on 11/16/18 and social work to see pt on 11/19/18 for conversation about advanced directives, hospice (pt recently refused per [place]) and goals of care.  Alvis Lemmings RN reports from yesterday's home visit upon arrival pt 02 saturation in 70's and highest during visit was 84%, 911 called and pt refused to be transported, primary care at [facility] notified, pt is on oxygen at 4 liters 24 hours daily and using inhalers per home health RN.    PLAN Continue to follow and outreach pt next week  Jacqlyn Larsen [facility], [place] Coordinator [phone]

## 2018-11-15 NOTE — ED Notes (Signed)
Rainbow set , lactic, and set of blood cultures collected and sent to lab

## 2018-11-15 NOTE — ED Triage Notes (Signed)
Pt in by [facility] for resp distress, given albuterol neb 2.5 en route by ems.  Pt was covid negative here last week.

## 2018-11-16 ENCOUNTER — Encounter (HOSPITAL_COMMUNITY): Payer: Self-pay | Admitting: Internal Medicine

## 2018-11-16 ENCOUNTER — Inpatient Hospital Stay (HOSPITAL_COMMUNITY): Payer: Medicare Other

## 2018-11-16 ENCOUNTER — Other Ambulatory Visit: Payer: Self-pay

## 2018-11-16 DIAGNOSIS — E1169 Type 2 diabetes mellitus with other specified complication: Secondary | ICD-10-CM | POA: Diagnosis not present

## 2018-11-16 DIAGNOSIS — J9602 Acute respiratory failure with hypercapnia: Secondary | ICD-10-CM | POA: Diagnosis not present

## 2018-11-16 DIAGNOSIS — Z79899 Other long term (current) drug therapy: Secondary | ICD-10-CM | POA: Diagnosis not present

## 2018-11-16 DIAGNOSIS — M4856XA Collapsed vertebra, not elsewhere classified, lumbar region, initial encounter for fracture: Secondary | ICD-10-CM | POA: Diagnosis present

## 2018-11-16 DIAGNOSIS — J9600 Acute respiratory failure, unspecified whether with hypoxia or hypercapnia: Secondary | ICD-10-CM | POA: Diagnosis not present

## 2018-11-16 DIAGNOSIS — I4891 Unspecified atrial fibrillation: Secondary | ICD-10-CM | POA: Diagnosis present

## 2018-11-16 DIAGNOSIS — E1165 Type 2 diabetes mellitus with hyperglycemia: Secondary | ICD-10-CM | POA: Diagnosis present

## 2018-11-16 DIAGNOSIS — I248 Other forms of acute ischemic heart disease: Secondary | ICD-10-CM | POA: Diagnosis present

## 2018-11-16 DIAGNOSIS — I4892 Unspecified atrial flutter: Secondary | ICD-10-CM | POA: Diagnosis not present

## 2018-11-16 DIAGNOSIS — Z20828 Contact with and (suspected) exposure to other viral communicable diseases: Secondary | ICD-10-CM | POA: Diagnosis present

## 2018-11-16 DIAGNOSIS — J441 Chronic obstructive pulmonary disease with (acute) exacerbation: Secondary | ICD-10-CM | POA: Diagnosis not present

## 2018-11-16 DIAGNOSIS — Z9011 Acquired absence of right breast and nipple: Secondary | ICD-10-CM | POA: Diagnosis not present

## 2018-11-16 DIAGNOSIS — Z853 Personal history of malignant neoplasm of breast: Secondary | ICD-10-CM | POA: Diagnosis not present

## 2018-11-16 DIAGNOSIS — R0603 Acute respiratory distress: Secondary | ICD-10-CM | POA: Diagnosis present

## 2018-11-16 DIAGNOSIS — Z7189 Other specified counseling: Secondary | ICD-10-CM | POA: Diagnosis not present

## 2018-11-16 DIAGNOSIS — M810 Age-related osteoporosis without current pathological fracture: Secondary | ICD-10-CM | POA: Diagnosis present

## 2018-11-16 DIAGNOSIS — T380X5A Adverse effect of glucocorticoids and synthetic analogues, initial encounter: Secondary | ICD-10-CM | POA: Diagnosis present

## 2018-11-16 DIAGNOSIS — J9622 Acute and chronic respiratory failure with hypercapnia: Secondary | ICD-10-CM | POA: Diagnosis not present

## 2018-11-16 DIAGNOSIS — Z87891 Personal history of nicotine dependence: Secondary | ICD-10-CM | POA: Diagnosis not present

## 2018-11-16 DIAGNOSIS — Z9981 Dependence on supplemental oxygen: Secondary | ICD-10-CM | POA: Diagnosis not present

## 2018-11-16 DIAGNOSIS — I361 Nonrheumatic tricuspid (valve) insufficiency: Secondary | ICD-10-CM | POA: Diagnosis not present

## 2018-11-16 DIAGNOSIS — Z515 Encounter for palliative care: Secondary | ICD-10-CM | POA: Diagnosis not present

## 2018-11-16 DIAGNOSIS — Z833 Family history of diabetes mellitus: Secondary | ICD-10-CM | POA: Diagnosis not present

## 2018-11-16 DIAGNOSIS — I110 Hypertensive heart disease with heart failure: Secondary | ICD-10-CM | POA: Diagnosis present

## 2018-11-16 DIAGNOSIS — J690 Pneumonitis due to inhalation of food and vomit: Secondary | ICD-10-CM | POA: Diagnosis not present

## 2018-11-16 DIAGNOSIS — F0390 Unspecified dementia without behavioral disturbance: Secondary | ICD-10-CM | POA: Diagnosis present

## 2018-11-16 DIAGNOSIS — J9601 Acute respiratory failure with hypoxia: Secondary | ICD-10-CM | POA: Diagnosis present

## 2018-11-16 DIAGNOSIS — J9621 Acute and chronic respiratory failure with hypoxia: Secondary | ICD-10-CM | POA: Diagnosis not present

## 2018-11-16 DIAGNOSIS — I5033 Acute on chronic diastolic (congestive) heart failure: Secondary | ICD-10-CM | POA: Diagnosis present

## 2018-11-16 DIAGNOSIS — F05 Delirium due to known physiological condition: Secondary | ICD-10-CM | POA: Diagnosis present

## 2018-11-16 DIAGNOSIS — J9811 Atelectasis: Secondary | ICD-10-CM | POA: Diagnosis present

## 2018-11-16 LAB — GLUCOSE, CAPILLARY
Glucose-Capillary: 249 mg/dL — ABNORMAL HIGH (ref 70–99)
Glucose-Capillary: 235 mg/dL — ABNORMAL HIGH (ref 70–99)
Glucose-Capillary: 238 mg/dL — ABNORMAL HIGH (ref 70–99)
Glucose-Capillary: 242 mg/dL — ABNORMAL HIGH (ref 70–99)

## 2018-11-16 LAB — BLOOD GAS, VENOUS
Acid-Base Excess: 18.2 mmol/L — ABNORMAL HIGH (ref 0.0–2.0)
Bicarbonate: 39.1 mmol/L — ABNORMAL HIGH (ref 20.0–28.0)
FIO2: 50
O2 Saturation: 89.2 %
Patient temperature: 37
pCO2, Ven: 95.1 mmHg (ref 44.0–60.0)
pH, Ven: 7.299 (ref 7.250–7.430)
pO2, Ven: 63.1 mmHg — ABNORMAL HIGH (ref 32.0–45.0)

## 2018-11-16 LAB — COMPREHENSIVE METABOLIC PANEL
ALT: 26 U/L (ref 0–44)
AST: 24 U/L (ref 15–41)
Albumin: 3.1 g/dL — ABNORMAL LOW (ref 3.5–5.0)
Alkaline Phosphatase: 74 U/L (ref 38–126)
Anion gap: 13 (ref 5–15)
BUN: 21 mg/dL (ref 8–23)
CO2: 35 mmol/L — ABNORMAL HIGH (ref 22–32)
Calcium: 9.1 mg/dL (ref 8.9–10.3)
Chloride: 89 mmol/L — ABNORMAL LOW (ref 98–111)
Creatinine, Ser: 1.05 mg/dL — ABNORMAL HIGH (ref 0.44–1.00)
GFR calc Af Amer: >60 mL/min (ref >60)
GFR calc non Af Amer: 53 mL/min — ABNORMAL LOW (ref >60)
Glucose, Bld: 193 mg/dL — ABNORMAL HIGH (ref 70–99)
Potassium: 5.1 mmol/L (ref 3.5–5.1)
Sodium: 137 mmol/L (ref 135–145)
Total Bilirubin: 0.6 mg/dL (ref 0.3–1.2)
Total Protein: 6.3 g/dL — ABNORMAL LOW (ref 6.5–8.1)

## 2018-11-16 LAB — CBC
HCT: 36.1 % (ref 36.0–46.0)
Hemoglobin: 9.9 g/dL — ABNORMAL LOW (ref 12.0–15.0)
MCH: 27.8 pg (ref 26.0–34.0)
MCHC: 27.4 g/dL — ABNORMAL LOW (ref 30.0–36.0)
MCV: 101.4 fL — ABNORMAL HIGH (ref 80.0–100.0)
Platelets: 348 10*3/uL (ref 150–400)
RBC: 3.56 MIL/uL — ABNORMAL LOW (ref 3.87–5.11)
RDW: 14.1 % (ref 11.5–15.5)
WBC: 8.1 10*3/uL (ref 4.0–10.5)
nRBC: 0 % (ref 0.0–0.2)

## 2018-11-16 LAB — BLOOD GAS, ARTERIAL
Acid-Base Excess: 12.7 mmol/L — ABNORMAL HIGH (ref 0.0–2.0)
Bicarbonate: 34.9 mmol/L — ABNORMAL HIGH (ref 20.0–28.0)
FIO2: 40
O2 Saturation: 92.3 %
Patient temperature: 36.7
pCO2 arterial: 77.5 mmHg (ref 32.0–48.0)
pH, Arterial: 7.324 — ABNORMAL LOW (ref 7.350–7.450)
pO2, Arterial: 67.4 mmHg — ABNORMAL LOW (ref 83.0–108.0)

## 2018-11-16 LAB — TROPONIN I (HIGH SENSITIVITY)
Troponin I (High Sensitivity): 19 ng/L — ABNORMAL HIGH (ref <18)
Troponin I (High Sensitivity): 21 ng/L — ABNORMAL HIGH (ref <18)

## 2018-11-16 LAB — BASIC METABOLIC PANEL
Anion gap: 16 — ABNORMAL HIGH (ref 5–15)
BUN: 25 mg/dL — ABNORMAL HIGH (ref 8–23)
CO2: 36 mmol/L — ABNORMAL HIGH (ref 22–32)
Calcium: 9.4 mg/dL (ref 8.9–10.3)
Chloride: 86 mmol/L — ABNORMAL LOW (ref 98–111)
Creatinine, Ser: 1.12 mg/dL — ABNORMAL HIGH (ref 0.44–1.00)
GFR calc Af Amer: 56 mL/min — ABNORMAL LOW (ref >60)
GFR calc non Af Amer: 49 mL/min — ABNORMAL LOW (ref >60)
Glucose, Bld: 258 mg/dL — ABNORMAL HIGH (ref 70–99)
Potassium: 4.9 mmol/L (ref 3.5–5.1)
Sodium: 138 mmol/L (ref 135–145)

## 2018-11-16 LAB — BRAIN NATRIURETIC PEPTIDE: B Natriuretic Peptide: 1075 pg/mL — ABNORMAL HIGH (ref 0.0–100.0)

## 2018-11-16 LAB — SARS CORONAVIRUS 2 BY RT PCR (HOSPITAL ORDER, PERFORMED IN ~~LOC~~ HOSPITAL LAB): SARS Coronavirus 2: NEGATIVE

## 2018-11-16 LAB — ECHOCARDIOGRAM COMPLETE: Weight: 2412.71 oz

## 2018-11-16 MED ORDER — QUETIAPINE FUMARATE 25 MG PO TABS
25.0000 mg | ORAL_TABLET | Freq: Every day | ORAL | Status: DC
  Administered 2018-11-16 – 2018-11-21 (×6): 25 mg via ORAL
  Filled 2018-11-16 (×6): qty 1

## 2018-11-16 MED ORDER — FUROSEMIDE 10 MG/ML IJ SOLN
40.0000 mg | Freq: Every day | INTRAMUSCULAR | Status: DC
  Administered 2018-11-16 – 2018-11-18 (×3): 40 mg via INTRAVENOUS
  Filled 2018-11-16 (×3): qty 4

## 2018-11-16 MED ORDER — ALBUTEROL SULFATE (2.5 MG/3ML) 0.083% IN NEBU
2.5000 mg | INHALATION_SOLUTION | Freq: Four times a day (QID) | RESPIRATORY_TRACT | Status: DC | PRN
  Filled 2018-11-16: qty 3

## 2018-11-16 MED ORDER — METOPROLOL TARTRATE 50 MG PO TABS
50.0000 mg | ORAL_TABLET | Freq: Two times a day (BID) | ORAL | Status: DC
  Administered 2018-11-16: 50 mg via ORAL
  Filled 2018-11-16: qty 1

## 2018-11-16 MED ORDER — INSULIN ASPART 100 UNIT/ML ~~LOC~~ SOLN
0.0000 [IU] | Freq: Three times a day (TID) | SUBCUTANEOUS | Status: DC
  Administered 2018-11-16 (×2): 3 [IU] via SUBCUTANEOUS

## 2018-11-16 MED ORDER — DOCUSATE SODIUM 100 MG PO CAPS
100.0000 mg | ORAL_CAPSULE | Freq: Every day | ORAL | Status: DC
  Administered 2018-11-18: 100 mg via ORAL
  Filled 2018-11-16 (×2): qty 1

## 2018-11-16 MED ORDER — SODIUM CHLORIDE 0.9 % IV SOLN
250.0000 mL | INTRAVENOUS | Status: DC | PRN
  Administered 2018-11-18 – 2018-11-19 (×2): 250 mL via INTRAVENOUS

## 2018-11-16 MED ORDER — ACETAMINOPHEN 325 MG PO TABS
650.0000 mg | ORAL_TABLET | Freq: Four times a day (QID) | ORAL | Status: DC | PRN
  Administered 2018-11-16 – 2018-11-21 (×6): 650 mg via ORAL
  Filled 2018-11-16 (×6): qty 2

## 2018-11-16 MED ORDER — HYDROCODONE-ACETAMINOPHEN 5-325 MG PO TABS
1.0000 | ORAL_TABLET | Freq: Four times a day (QID) | ORAL | Status: DC | PRN
  Administered 2018-11-16 – 2018-11-17 (×2): 2 via ORAL
  Filled 2018-11-16 (×2): qty 2

## 2018-11-16 MED ORDER — FUROSEMIDE 40 MG PO TABS: 60.0000 mg | ORAL_TABLET | Freq: Every day | ORAL | Status: DC

## 2018-11-16 MED ORDER — INSULIN ASPART 100 UNIT/ML ~~LOC~~ SOLN
0.0000 [IU] | Freq: Three times a day (TID) | SUBCUTANEOUS | Status: DC
  Administered 2018-11-17 – 2018-11-18 (×3): 7 [IU] via SUBCUTANEOUS
  Administered 2018-11-18: 11 [IU] via SUBCUTANEOUS
  Administered 2018-11-18: 08:00:00 4 [IU] via SUBCUTANEOUS
  Administered 2018-11-19 (×2): 11 [IU] via SUBCUTANEOUS

## 2018-11-16 MED ORDER — INSULIN ASPART 100 UNIT/ML ~~LOC~~ SOLN: 4.0000 [IU] | Freq: Three times a day (TID) | SUBCUTANEOUS | Status: DC

## 2018-11-16 MED ORDER — ACETAMINOPHEN 650 MG RE SUPP: 650.0000 mg | Freq: Four times a day (QID) | RECTAL | Status: DC | PRN

## 2018-11-16 MED ORDER — INSULIN ASPART 100 UNIT/ML ~~LOC~~ SOLN
0.0000 [IU] | Freq: Every day | SUBCUTANEOUS | Status: DC
  Administered 2018-11-16: 2 [IU] via SUBCUTANEOUS
  Administered 2018-11-18: 22:00:00 3 [IU] via SUBCUTANEOUS

## 2018-11-16 MED ORDER — LORAZEPAM 2 MG/ML IJ SOLN
1.0000 mg | Freq: Once | INTRAMUSCULAR | Status: AC
  Administered 2018-11-16: 1 mg via INTRAVENOUS
  Filled 2018-11-16: qty 1

## 2018-11-16 MED ORDER — SODIUM CHLORIDE 0.9 % IV SOLN
1.0000 g | Freq: Once | INTRAVENOUS | Status: AC
  Administered 2018-11-16: 1 g via INTRAVENOUS
  Filled 2018-11-16: qty 10

## 2018-11-16 MED ORDER — UMECLIDINIUM BROMIDE 62.5 MCG/INH IN AEPB
1.0000 | INHALATION_SPRAY | Freq: Every day | RESPIRATORY_TRACT | Status: DC
  Administered 2018-11-17 – 2018-11-22 (×5): 1 via RESPIRATORY_TRACT
  Filled 2018-11-16 (×4): qty 7

## 2018-11-16 MED ORDER — METFORMIN HCL ER 500 MG PO TB24: 500.0000 mg | ORAL_TABLET | Freq: Every day | ORAL | Status: DC

## 2018-11-16 MED ORDER — INSULIN ASPART 100 UNIT/ML ~~LOC~~ SOLN: 0.0000 [IU] | Freq: Every day | SUBCUTANEOUS | Status: DC

## 2018-11-16 MED ORDER — GUAIFENESIN ER 600 MG PO TB12
600.0000 mg | ORAL_TABLET | Freq: Two times a day (BID) | ORAL | Status: DC
  Administered 2018-11-16 – 2018-11-18 (×4): 600 mg via ORAL
  Filled 2018-11-16 (×5): qty 1

## 2018-11-16 MED ORDER — RIVAROXABAN 15 MG PO TABS
15.0000 mg | ORAL_TABLET | Freq: Every day | ORAL | Status: DC
  Administered 2018-11-17 – 2018-11-18 (×2): 15 mg via ORAL
  Filled 2018-11-16 (×5): qty 1

## 2018-11-16 MED ORDER — INSULIN GLARGINE 100 UNIT/ML ~~LOC~~ SOLN
15.0000 [IU] | Freq: Every day | SUBCUTANEOUS | Status: DC
  Administered 2018-11-17 – 2018-11-18 (×2): 15 [IU] via SUBCUTANEOUS
  Filled 2018-11-16 (×4): qty 0.15

## 2018-11-16 MED ORDER — SODIUM CHLORIDE 0.9 % IV SOLN
500.0000 mg | INTRAVENOUS | Status: DC
  Administered 2018-11-17 – 2018-11-18 (×2): 500 mg via INTRAVENOUS
  Filled 2018-11-16 (×2): qty 500

## 2018-11-16 MED ORDER — CHLORHEXIDINE GLUCONATE CLOTH 2 % EX PADS
6.0000 | MEDICATED_PAD | Freq: Every day | CUTANEOUS | Status: DC
  Administered 2018-11-16 – 2018-11-22 (×6): 6 via TOPICAL

## 2018-11-16 MED ORDER — SODIUM CHLORIDE 0.9% FLUSH
3.0000 mL | Freq: Two times a day (BID) | INTRAVENOUS | Status: DC
  Administered 2018-11-16 – 2018-11-20 (×9): 3 mL via INTRAVENOUS
  Administered 2018-11-20: 10:00:00 10 mL via INTRAVENOUS
  Administered 2018-11-21: 23:00:00 3 mL via INTRAVENOUS
  Administered 2018-11-21: 10 mL via INTRAVENOUS
  Administered 2018-11-22: 3 mL via INTRAVENOUS

## 2018-11-16 MED ORDER — POLYETHYLENE GLYCOL 3350 17 G PO PACK
17.0000 g | PACK | Freq: Every day | ORAL | Status: DC | PRN
  Administered 2018-11-21: 08:00:00 17 g via ORAL
  Filled 2018-11-16: qty 1

## 2018-11-16 MED ORDER — ALBUTEROL SULFATE (2.5 MG/3ML) 0.083% IN NEBU
2.5000 mg | INHALATION_SOLUTION | Freq: Four times a day (QID) | RESPIRATORY_TRACT | Status: DC
  Administered 2018-11-16 – 2018-11-20 (×15): 2.5 mg via RESPIRATORY_TRACT
  Filled 2018-11-16 (×14): qty 3

## 2018-11-16 MED ORDER — POTASSIUM CHLORIDE CRYS ER 20 MEQ PO TBCR
20.0000 meq | EXTENDED_RELEASE_TABLET | Freq: Two times a day (BID) | ORAL | Status: DC
  Administered 2018-11-16 – 2018-11-18 (×5): 20 meq via ORAL
  Filled 2018-11-16 (×5): qty 1

## 2018-11-16 MED ORDER — SODIUM CHLORIDE 0.9% FLUSH: 3.0000 mL | INTRAVENOUS | Status: DC | PRN

## 2018-11-16 MED ORDER — FLUTICASONE-UMECLIDIN-VILANT 100-62.5-25 MCG/INH IN AEPB: 1.0000 | INHALATION_SPRAY | Freq: Every day | RESPIRATORY_TRACT | Status: DC

## 2018-11-16 MED ORDER — SODIUM CHLORIDE 0.9 % IV SOLN
500.0000 mg | Freq: Once | INTRAVENOUS | Status: AC
  Administered 2018-11-16: 500 mg via INTRAVENOUS
  Filled 2018-11-16: qty 500

## 2018-11-16 MED ORDER — FLUTICASONE FUROATE-VILANTEROL 100-25 MCG/INH IN AEPB
1.0000 | INHALATION_SPRAY | Freq: Every day | RESPIRATORY_TRACT | Status: DC
  Administered 2018-11-17 – 2018-11-22 (×5): 1 via RESPIRATORY_TRACT
  Filled 2018-11-16 (×4): qty 28

## 2018-11-16 MED ORDER — METHYLPREDNISOLONE SODIUM SUCC 125 MG IJ SOLR
80.0000 mg | Freq: Three times a day (TID) | INTRAMUSCULAR | Status: DC
  Administered 2018-11-16 – 2018-11-17 (×4): 80 mg via INTRAVENOUS
  Filled 2018-11-16 (×4): qty 2

## 2018-11-16 NOTE — ED Provider Notes (Signed)
Emergency Department Provider Note   I have reviewed the triage vital signs and the nursing notes.   HISTORY  Chief Complaint Respiratory Distress   HPI Eileen [name] is a 73 y.o. female with a multiple medical problems as documented below but most importantly COPD and CHF who presents the emergency department today with respiratory distress.  Patient, on review of records, is here often for respiratory failure with hypoxia and hypercarbia.  Symptoms altered with that as well.  Onset tonight was similar.  EMS got to her and her sats were low they gave her albuterol brought here for further evaluation.  No further history able to be obtained secondary to patient's mental status.  No other associated or modifying symptoms.   Level 5 caveat secondary to mental status change. Past Medical History:  Diagnosis Date  . Asthma   . Atrial flutter ([place])   . Cancer [facility])    Right breast  . COPD (chronic obstructive pulmonary disease) ([place])   . Dementia ([place])   . History of breast cancer    right breast  . Hypertension   . On home O2   . Type 2 diabetes mellitus [facility])     Patient Active Problem List   Diagnosis Date Noted  . Acute respiratory failure with hypoxia and hypercapnia ([place]) 11/16/2018  . Chronic obstructive bronchitis ([place])   . Dementia with behavioral disturbance ([place])   . Goals of care, counseling/discussion   . Advanced care planning/counseling discussion   . Palliative care by specialist   . Bronchiectasis with acute exacerbation ([place])   . COPD exacerbation ([place]) 10/18/2018  . Anemia 10/18/2018  . Osteoporosis 08/30/2018  . Closed compression fracture of body of L1 vertebra ([place])   . Constipation   . Acute and chronic respiratory failure (acute-on-chronic) ([place]) 08/10/2018  . Left lower lobe pneumonia ([place]) 08/10/2018  . Leukocytosis 08/10/2018  . Chronic respiratory failure with hypoxia ([place]) 06/28/2018  . Acute upper GI bleeding 01/20/2018  . Lower GI  bleed 01/20/2018  . Acute respiratory failure with hypoxia ([place]) 09/17/2017  . Diabetes mellitus ([place]) 08/31/2017  . Acute hypoxemic respiratory failure ([place]) 08/31/2017  . Hemorrhoids 10/30/2016  . Atrial fibrillation with RVR ([place]) 09/24/2016  . Atrial flutter ([place]) 09/23/2016  . Obstructive chronic bronchitis with exacerbation ([place]) 05/23/2016  . Vitamin D deficiency 05/23/2016  . Acute on chronic respiratory failure ([place]) 03/24/2015  . Metabolic encephalopathy [id number]  . HCAP (healthcare-associated pneumonia) 03/24/2015  . Anticoagulation management encounter   . New onset atrial flutter ([place]) 03/21/2015  . Obesity 03/21/2015  . Atrial flutter with rapid ventricular response ([place]) 03/21/2015  . COPD (chronic obstructive pulmonary disease) ([place]) 12/20/2014  . COPD with acute exacerbation ([place]) 12/20/2014  . 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 Breast cancer ([place]) 10/11/2009    Past Surgical History:  Procedure Laterality Date  . BREAST SURGERY  2011   Right breast mastectomy  . CARDIOVERSION N/A 06/27/2018   Procedure: CARDIOVERSION;  Surgeon: Arnoldo Lenis, MD;  Location: [facility] END[facility] SU[facility];  Service: Endoscopy;  Laterality: N/A;  . CESAREAN SECTION    . COLONOSCOPY N/A 01/22/2018   Procedure: COLONOSCOPY;  Surgeon: Rogene Houston, MD;  Location: [facility] ENDO SUITE;  Service: Endoscopy;  Laterality: N/A;  . HERNIA REPAIR     RIH  . IR VERTEBROPLASTY LUMBAR BX INC UNI/BIL INC/INJECT/IMAGING  09/05/2018  . MASTECTOMY  2011   right breast  .  TEE WITHOUT CARDIOVERSION N/A 06/27/2018   Procedure: TRANSESOPHAGEAL ECHOCARDIOGRAM (TEE) WITH PROPOFOL;  Surgeon: Arnoldo Lenis, MD;  Location: [facility] ENDO SUITE;  Service: Endoscopy;  Laterality: N/A;      Allergies Codeine  Family History  Problem Relation Age of Onset  . Cancer Mother        lung  . Diabetes Brother     Social History Social History    Tobacco Use  . Smoking status: Former Smoker    Packs/day: 0.50    Years: 32.00    Pack years: 16.00    Types: Cigarettes    Start date: 12/12/1962    Quit date: 03/13/1994    Years since quitting: 24.6  . Smokeless tobacco: Never Used  Substance Use Topics  . Alcohol use: No    Alcohol/week: 0.0 standard drinks  . Drug use: No    Review of Systems  Level 5 caveat secondary to mental status change ____________________________________________   PHYSICAL EXAM:  VITAL SIGNS: ED Triage Vitals  Enc Vitals Group     BP 11/15/18 2335 109/68     Pulse Rate 11/15/18 2311 (!) 109     Resp 11/15/18 2311 (!) 40     Temp 11/15/18 2337 98.3 F (36.8 C)     Temp Source 11/15/18 2337 Oral     SpO2 11/15/18 2306 (!) 41 %    Constitutional: Sleepy but arousable.  Chronically ill-appearing in respiratory distress. Eyes: Conjunctivae are normal. PERRL. EOMI. Head: Atraumatic. Nose: No congestion/rhinnorhea. Mouth/Throat: Mucous membranes are dry.  Oropharynx non-erythematous. Neck: No stridor.  No meningeal signs.   Cardiovascular: Tachycardic rate, regular rhythm. Good peripheral circulation. Grossly normal heart sounds.   Respiratory: Tachypneic, using accessory muscles with retractions.  Her lungs are diminished with diffuse wheezing and mild basilar crackles. Gastrointestinal: Soft and nontender. No distention.  Musculoskeletal: No lower extremity tenderness nor edema. No gross deformities of extremities. Neurologic: Not able to fully assess secondary to mental status change but no gross abnormalities noted. Skin:  Skin is warm, dry and intact. No rash noted.   ____________________________________________   LABS (all labs ordered are listed, but only abnormal results are displayed)  Labs Reviewed  CBC WITH DIFFERENTIAL/PLATELET - Abnormal; Notable for the following components:      Result Value   WBC 12.8 (*)    RBC 3.84 (*)    Hemoglobin 10.6 (*)    MCV 101.8 (*)    MCHC  27.1 (*)    Platelets 414 (*)    Neutro Abs 9.6 (*)    Monocytes Absolute 1.6 (*)    Abs Immature Granulocytes 0.13 (*)    All other components within normal limits  COMPREHENSIVE METABOLIC PANEL - Abnormal; Notable for the following components:   Potassium 5.2 (*)    Chloride 86 (*)    CO2 38 (*)    Glucose, Bld 274 (*)    Creatinine, Ser 1.21 (*)    GFR calc non Af Amer 44 (*)    GFR calc Af Amer 51 (*)    All other components within normal limits  BRAIN NATRIURETIC PEPTIDE - Abnormal; Notable for the following components:   B Natriuretic Peptide 1,075.0 (*)    All other components within normal limits  BLOOD GAS, VENOUS - Abnormal; Notable for the following components:   pCO2, Ven 95.1 (*)    pO2, Ven 63.1 (*)    Bicarbonate 39.1 (*)    Acid-Base Excess 18.2 (*)    All other components  within normal limits  TROPONIN I (HIGH SENSITIVITY) - Abnormal; Notable for the following components:   Troponin I (High Sensitivity) 19 (*)    All other components within normal limits  TROPONIN I (HIGH SENSITIVITY) - Abnormal; Notable for the following components:   Troponin I (High Sensitivity) 21 (*)    All other components within normal limits  SARS CORONAVIRUS 2 (HOSPITAL ORDER, [place] [facility])  COMPREHENSIVE METABOLIC PANEL  CBC  BASIC METABOLIC PANEL  I-STAT VENOUS BLOOD GAS, ED   ____________________________________________  EKG   EKG Interpretation  Date/Time:  Friday November 15 2018 23:31:49 EDT Ventricular Rate:  91 PR Interval:    QRS Duration: 89 QT Interval:  357 QTC Calculation: 440 R Axis:   66 Text Interpretation:  Normal sinus rhythm No significant change since last tracing Confirmed by Merrily Pew [phone]) on 11/16/2018 12:08:56 AM       ____________________________________________  RADIOLOGY  Dg Chest Portable 1 View  Result Date: 11/16/2018 CLINICAL DATA:  Respiratory distress EXAM: PORTABLE CHEST 1 VIEW COMPARISON:  November 06, 2018 FINDINGS: There is mild cardiomegaly. Streaky airspace opacity seen at the right lung base. The left lung is clear. No acute osseous abnormality. IMPRESSION: Streaky airspace opacity at the right lung base which could be due to atelectasis and/or early infectious etiology. Electronically Signed   By: Prudencio Pair M.D.   On: 11/16/2018 00:07    ____________________________________________   PROCEDURES  Procedure(s) performed:   .Critical Care Performed by: Merrily Pew, MD Authorized by: Merrily Pew, MD   Critical care provider statement:    Critical care time (minutes):  45   Critical care was necessary to treat or prevent imminent or life-threatening deterioration of the following conditions:  Respiratory failure   Critical care was time spent personally by me on the following activities:  Discussions with consultants, evaluation of patient's response to treatment, examination of patient, ordering and performing treatments and interventions, ordering and review of laboratory studies, ordering and review of radiographic studies, pulse oximetry, re-evaluation of patient's condition, obtaining history from patient or surrogate and review of old charts  ____________________________________________   INITIAL IMPRESSION / [place] / ED COURSE  Suspect this is hypercarbic and hypoxic respiratory failure secondary to COPD may be with some CHF component to it as well.  Patient on BiPAP on arrival that she was negative COVID last week and obvious need of positive pressure.  She was also negative pressure room.  All precautions were taken.  Patient will be ordered Solu-Medrol, duo nebs pending x-ray and work-up.  Obviously patient will need to be admitted.  Will follow in the emergency room for 1 to 2 hours to ensure no need for intubation.  MS improving. VBG c/w respiratory acidosis, partially compensated.  Appears stable to continue bipap. Will admit.  Pertinent labs &  imaging results that were available during my care of the patient were reviewed by me and considered in my medical decision making (see chart for details).   ____________________________________________  FINAL CLINICAL IMPRESSION(S) / ED DIAGNOSES  Final diagnoses:  COPD exacerbation ([place])  Respiratory distress  Acute respiratory failure with hypoxia and hypercapnia ([facility])     MEDICATIONS GIVEN DURING THIS VISIT:  Medications  sodium chloride flush (NS) 0.9 % injection 3 mL (3 mLs Intravenous Not Given 11/16/18 0218)  sodium chloride flush (NS) 0.9 % injection 3 mL (has no administration in time range)  0.9 %  sodium chloride infusion (has no administration  in time range)  acetaminophen (TYLENOL) tablet 650 mg (has no administration in time range)    Or  acetaminophen (TYLENOL) suppository 650 mg (has no administration in time range)  insulin aspart (novoLOG) injection 0-9 Units (has no administration in time range)  insulin aspart (novoLOG) injection 0-5 Units (has no administration in time range)  furosemide (LASIX) tablet 60 mg (has no administration in time range)  metoprolol tartrate (LOPRESSOR) tablet 50 mg (has no administration in time range)  QUEtiapine (SEROQUEL) tablet 25 mg (has no administration in time range)  metFORMIN (GLUCOPHAGE-XR) 24 hr tablet 500 mg (has no administration in time range)  docusate sodium (COLACE) capsule 100 mg (has no administration in time range)  polyethylene glycol (MIRALAX / GLYCOLAX) packet 17 g (has no administration in time range)  potassium chloride SA (K-DUR) CR tablet 20 mEq (has no administration in time range)  albuterol (PROVENTIL) (2.5 MG/3ML) 0.083% nebulizer solution 2.5 mg (has no administration in time range)  guaiFENesin (MUCINEX) 12 hr tablet 600 mg (has no administration in time range)  Fluticasone-Umeclidin-Vilant 100-62.5-25 MCG/INH AEPB 1 puff (has no administration in time range)  Rivaroxaban (XARELTO) tablet 15 mg (has no  administration in time range)  methylPREDNISolone sodium succinate (SOLU-MEDROL) 125 mg/2 mL injection 80 mg (has no administration in time range)  azithromycin (ZITHROMAX) 500 mg in sodium chloride 0.9 % 250 mL IVPB (500 mg Intravenous Not Given 11/16/18 0408)  albuterol (PROVENTIL) (2.5 MG/3ML) 0.083% nebulizer solution 2.5 mg (has no administration in time range)  Chlorhexidine Gluconate Cloth 2 % PADS 6 each (has no administration in time range)  ipratropium-albuterol (DUONEB) 0.5-2.5 (3) MG/3ML nebulizer solution 3 mL (3 mLs Nebulization Given 11/15/18 2355)  methylPREDNISolone sodium succinate (SOLU-MEDROL) 125 mg/2 mL injection 125 mg (125 mg Intravenous Given 11/16/18 0024)  cefTRIAXone (ROCEPHIN) 1 g in sodium chloride 0.9 % 100 mL IVPB (0 g Intravenous Stopped 11/16/18 0200)  azithromycin (ZITHROMAX) 500 mg in sodium chloride 0.9 % 250 mL IVPB (0 mg Intravenous Stopped 11/16/18 0310)     NEW OUTPATIENT MEDICATIONS STARTED DURING THIS VISIT:  Current Discharge Medication List      Note:  This note was prepared with assistance of Dragon voice recognition software. Occasional wrong-word or sound-a-like substitutions may have occurred due to the inherent limitations of voice recognition software.   Antionne Enrique, Corene Cornea, MD 11/16/18 [phone]

## 2018-11-16 NOTE — Consult Note (Signed)
Consult requested by: [facility] hospitalist, Dr. Geanie Kenning Consult requested for respiratory failure  HPI: This is a 73 year old who has multiple medical problems including COPD, chronic hypoxic and hypercapnic respiratory failure, recent history of healthcare associated pneumonia and COPD exacerbation, atrial flutter, pulmonary hypertension previous history of breast cancer probable diastolic heart failure.  She came to the emergency department because of hypoxia and confusion.  Blood gas earlier this morning shows pH of 7.29 PCO2 of 95 and PO2 of 63.  She is on BiPAP now.  She has been confused apparently and was pulling off her oxygen.  She has recently been diagnosed with dementia and she had been put on Seroquel to be given at about sundown because she had significant problems with sundowning.  He does not clear if she got that or not.  History is from the medical record as she is on BiPAP and there is no family here.  Additionally she is known to have diabetes hypertension.  Echocardiogram done in 2017 could not determine diastolic function and she had echocardiogram done in April of this year that was limited and she had mildly low systolic function but diastolic dysfunction could also not be definitely evaluated.  She had BNP of 1075 has elevated troponin levels thought to be related to demand ischemia from her hypoxia.  EKG does not show evidence of acute coronary syndrome.  Past Medical History:  Diagnosis Date  . Asthma   . Atrial flutter ([place])   . Cancer [facility])    Right breast  . COPD (chronic obstructive pulmonary disease) ([place])   . Dementia ([place])   . History of breast cancer    right breast  . Hypertension   . On home O2   . Type 2 diabetes mellitus (HCC)      Family History  Problem Relation Age of Onset  . Cancer Mother        lung  . Diabetes Brother      Social History   Socioeconomic History  . Marital status: Single    Spouse name: Not on file  . Number of children:  Not on file  . Years of education: Not on file  . Highest education level: Not on file  Occupational History  . Not on file  Social Needs  . Financial resource strain: Not hard at all  . Food insecurity    Worry: Never true    Inability: Never true  . Transportation needs    Medical: Patient refused    Non-medical: Patient refused  Tobacco Use  . Smoking status: Former Smoker    Packs/day: 0.50    Years: 32.00    Pack years: 16.00    Types: Cigarettes    Start date: 12/12/1962    Quit date: 03/13/1994    Years since quitting: 24.6  . Smokeless tobacco: Never Used  Substance and Sexual Activity  . Alcohol use: No    Alcohol/week: 0.0 standard drinks  . Drug use: No  . Sexual activity: Not on file  Lifestyle  . Physical activity    Days per week: Patient refused    Minutes per session: Patient refused  . Stress: Not at all  Relationships  . Social Herbalist on phone: Three times a week    Gets together: Three times a week    Attends religious service: More than 4 times per year    Active member of club or organization: Yes    Attends meetings of clubs or  organizations: More than 4 times per year    Relationship status: Never married  Other Topics Concern  . Not on file  Social History Narrative  . Not on file     ROS: Unobtainable    Objective: Vital signs in last 24 hours: Temp:  [98.1 F (36.7 C)-98.3 F (36.8 C)] 98.1 F (36.7 C) (09/05 0434) Pulse Rate:  [72-110] 95 (09/05 0434) Resp:  [14-40] 14 (09/05 0403) BP: (103-119)/(68-80) 115/73 (09/05 0434) SpO2:  [41 %-100 %] 95 % (09/05 0451) FiO2 (%):  [40 %-100 %] 50 % (09/05 0451) Weight:  [68.4 kg] 68.4 kg (09/05 0434) Weight change:     Intake/Output from previous day: 09/04 0701 - 09/05 0700 In: 350 [IV Piggyback:350] Out: -   PHYSICAL EXAM Constitutional: She is on BiPAP.  Sleepy.  Eyes: Pupils react.  Ears nose mouth and throat: Mucous membranes are dry limited exam because of the  BiPAP.  Cardiovascular: Her heart is regular with a soft systolic heart murmur.  Respiratory: Respiratory effort per BiPAP.  Her lungs are fairly clear.  Gastrointestinal: Her abdomen is soft with no masses.  Musculoskeletal: Could not assess.  Neurological: Could not assess.  Psychiatric: Could not assess  Lab Results: Basic Metabolic Panel: Recent Labs    11/15/18 2330 11/16/18 0508  NA 139 137  K 5.2* 5.1  CL 86* 89*  CO2 38* 35*  GLUCOSE 274* 193*  BUN 20 21  CREATININE 1.21* 1.05*  CALCIUM 9.9 9.1   Liver Function Tests: Recent Labs    11/15/18 2330 11/16/18 0508  AST 34 24  ALT 31 26  ALKPHOS 90 74  BILITOT 0.3 0.6  PROT 7.1 6.3*  ALBUMIN 3.5 3.1*   No results for input(s): LIPASE, AMYLASE in the last 72 hours. No results for input(s): AMMONIA in the last 72 hours. CBC: Recent Labs    11/15/18 2330 11/16/18 0508  WBC 12.8* 8.1  NEUTROABS 9.6*  --   HGB 10.6* 9.9*  HCT 39.1 36.1  MCV 101.8* 101.4*  PLT 414* 348   Cardiac Enzymes: No results for input(s): CKTOTAL, CKMB, CKMBINDEX, TROPONINI in the last 72 hours. BNP: No results for input(s): PROBNP in the last 72 hours. D-Dimer: No results for input(s): DDIMER in the last 72 hours. CBG: No results for input(s): GLUCAP in the last 72 hours. Hemoglobin A1C: No results for input(s): HGBA1C in the last 72 hours. Fasting Lipid Panel: No results for input(s): CHOL, HDL, LDLCALC, TRIG, CHOLHDL, LDLDIRECT in the last 72 hours. Thyroid Function Tests: No results for input(s): TSH, T4TOTAL, FREET4, T3FREE, THYROIDAB in the last 72 hours. Anemia Panel: No results for input(s): VITAMINB12, FOLATE, FERRITIN, TIBC, IRON, RETICCTPCT in the last 72 hours. Coagulation: No results for input(s): LABPROT, INR in the last 72 hours. Urine Drug Screen: Drugs of Abuse  No results found for: LABOPIA, COCAINSCRNUR, LABBENZ, AMPHETMU, THCU, LABBARB  Alcohol Level: No results for input(s): ETH in the last 72  hours. Urinalysis: No results for input(s): COLORURINE, LABSPEC, PHURINE, GLUCOSEU, HGBUR, BILIRUBINUR, KETONESUR, PROTEINUR, UROBILINOGEN, NITRITE, LEUKOCYTESUR in the last 72 hours.  Invalid input(s): APPERANCEUR Misc. Labs:   ABGS: Recent Labs    11/16/18 0111  HCO3 39.1*     MICROBIOLOGY: Recent Results (from the past 240 hour(s))  SARS Coronavirus 2 [facility] order, Performed in [facility] hospital lab) Nasopharyngeal Urine, Random     Status: None   Collection Time: 11/06/18  4:38 PM   Specimen: Urine, Random; Nasopharyngeal  Result Value  Ref Range Status   SARS Coronavirus 2 NEGATIVE NEGATIVE Final    Comment: (NOTE) If result is NEGATIVE SARS-CoV-2 target nucleic acids are NOT DETECTED. The SARS-CoV-2 RNA is generally detectable in upper and lower  respiratory specimens during the acute phase of infection. The lowest  concentration of SARS-CoV-2 viral copies this assay can detect is 250  copies / mL. A negative result does not preclude SARS-CoV-2 infection  and should not be used as the sole basis for treatment or other  patient management decisions.  A negative result may occur with  improper specimen collection / handling, submission of specimen other  than nasopharyngeal swab, presence of viral mutation(s) within the  areas targeted by this assay, and inadequate number of viral copies  (<250 copies / mL). A negative result must be combined with clinical  observations, patient history, and epidemiological information. If result is POSITIVE SARS-CoV-2 target nucleic acids are DETECTED. The SARS-CoV-2 RNA is generally detectable in upper and lower  respiratory specimens dur ing the acute phase of infection.  Positive  results are indicative of active infection with SARS-CoV-2.  Clinical  correlation with patient history and other diagnostic information is  necessary to determine patient infection status.  Positive results do  not rule out bacterial infection or  co-infection with other viruses. If result is PRESUMPTIVE POSTIVE SARS-CoV-2 nucleic acids MAY BE PRESENT.   A presumptive positive result was obtained on the submitted specimen  and confirmed on repeat testing.  While 2019 novel coronavirus  (SARS-CoV-2) nucleic acids may be present in the submitted sample  additional confirmatory testing may be necessary for epidemiological  and / or clinical management purposes  to differentiate between  SARS-CoV-2 and other Sarbecovirus currently known to infect humans.  If clinically indicated additional testing with an alternate test  methodology [phone]) is advised. The SARS-CoV-2 RNA is generally  detectable in upper and lower respiratory sp ecimens during the acute  phase of infection. The expected result is Negative. Fact Sheet for Patients:  StrictlyIdeas.no Fact Sheet for Healthcare Providers: BankingDealers.co.za This test is not yet approved or cleared by the [place] FDA and has been authorized for detection and/or diagnosis of SARS-CoV-2 by FDA under an Emergency Use Authorization (EUA).  This EUA will remain in effect (meaning this test can be used) for the duration of the COVID-19 declaration under Section 564(b)(1) of the Act, 21 U.S.C. section 360bbb-3(b)(1), unless the authorization is terminated or revoked sooner. Performed at [facility], [address] [place], [place] [address]   MRSA PCR Screening     Status: None   Collection Time: 11/06/18 11:00 PM   Specimen: Nasal Mucosa; Nasopharyngeal  Result Value Ref Range Status   MRSA by PCR NEGATIVE NEGATIVE Final    Comment:        The GeneXpert MRSA Assay (FDA approved for NASAL specimens only), is one component of a comprehensive MRSA colonization surveillance program. It is not intended to diagnose MRSA infection nor to guide or monitor treatment for MRSA infections. Performed at [facility], [address],  [place], [place] [address]   SARS Coronavirus 2 [facility] order, Performed in [place] hospital lab)     Status: None   Collection Time: 11/15/18 11:10 PM  Result Value Ref Range Status   SARS Coronavirus 2 NEGATIVE NEGATIVE Final    Comment: (NOTE) If result is NEGATIVE SARS-CoV-2 target nucleic acids are NOT DETECTED. The SARS-CoV-2 RNA is generally detectable in upper and lower  respiratory specimens  during the acute phase of infection. The lowest  concentration of SARS-CoV-2 viral copies this assay can detect is 250  copies / mL. A negative result does not preclude SARS-CoV-2 infection  and should not be used as the sole basis for treatment or other  patient management decisions.  A negative result may occur with  improper specimen collection / handling, submission of specimen other  than nasopharyngeal swab, presence of viral mutation(s) within the  areas targeted by this assay, and inadequate number of viral copies  (<250 copies / mL). A negative result must be combined with clinical  observations, patient history, and epidemiological information. If result is POSITIVE SARS-CoV-2 target nucleic acids are DETECTED. The SARS-CoV-2 RNA is generally detectable in upper and lower  respiratory specimens dur ing the acute phase of infection.  Positive  results are indicative of active infection with SARS-CoV-2.  Clinical  correlation with patient history and other diagnostic information is  necessary to determine patient infection status.  Positive results do  not rule out bacterial infection or co-infection with other viruses. If result is PRESUMPTIVE POSTIVE SARS-CoV-2 nucleic acids MAY BE PRESENT.   A presumptive positive result was obtained on the submitted specimen  and confirmed on repeat testing.  While 2019 novel coronavirus  (SARS-CoV-2) nucleic acids may be present in the submitted sample  additional confirmatory testing may be necessary for epidemiological  and / or clinical  management purposes  to differentiate between  SARS-CoV-2 and other Sarbecovirus currently known to infect humans.  If clinically indicated additional testing with an alternate test  methodology [phone]) is advised. The SARS-CoV-2 RNA is generally  detectable in upper and lower respiratory sp ecimens during the acute  phase of infection. The expected result is Negative. Fact Sheet for Patients:  StrictlyIdeas.no Fact Sheet for Healthcare Providers: BankingDealers.co.za This test is not yet approved or cleared by the [place] FDA and has been authorized for detection and/or diagnosis of SARS-CoV-2 by FDA under an Emergency Use Authorization (EUA).  This EUA will remain in effect (meaning this test can be used) for the duration of the COVID-19 declaration under Section 564(b)(1) of the Act, 21 U.S.C. section 360bbb-3(b)(1), unless the authorization is terminated or revoked sooner. Performed at [facility], [address], [place], [place] [address]     Studies/Results: Dg Chest Portable 1 View  Result Date: 11/16/2018 CLINICAL DATA:  Respiratory distress EXAM: PORTABLE CHEST 1 VIEW COMPARISON:  November 06, 2018 FINDINGS: There is mild cardiomegaly. Streaky airspace opacity seen at the right lung base. The left lung is clear. No acute osseous abnormality. IMPRESSION: Streaky airspace opacity at the right lung base which could be due to atelectasis and/or early infectious etiology. Electronically Signed   By: Prudencio Pair M.D.   On: 11/16/2018 00:07    Medications:  Prior to Admission:  Medications Prior to Admission  Medication Sig Dispense Refill Last Dose  . acetaminophen (TYLENOL) 500 MG tablet Take 500 mg by mouth every 8 (eight) hours as needed for mild pain or headache.      . albuterol (PROVENTIL) (2.5 MG/3ML) 0.083% nebulizer solution Take 2.5 mg by nebulization every 6 (six) hours as needed for wheezing or shortness of breath.      . docusate sodium (COLACE) 100 MG capsule Take 1 capsule (100 mg total) by mouth 2 (two) times daily. (Patient taking differently: Take 100 mg by mouth daily. ) 30 capsule 1   . furosemide (LASIX) 40 MG tablet Take 1.5 tablets (60  mg total) by mouth daily. 30 tablet    . guaiFENesin (MUCINEX) 600 MG 12 hr tablet Take 1 tablet (600 mg total) by mouth 2 (two) times daily. 30 tablet 0   . metFORMIN (GLUCOPHAGE-XR) 500 MG 24 hr tablet Take 500 mg by mouth daily with breakfast.   1   . metoprolol tartrate (LOPRESSOR) 50 MG tablet Take 50 mg by mouth 2 (two) times daily.     . polyethylene glycol (MIRALAX / GLYCOLAX) 17 g packet Take 17 g by mouth daily as needed for mild constipation. 28 each 0   . potassium chloride SA (K-DUR) 20 MEQ tablet Take 20 mEq by mouth 2 (two) times daily.     Marland Kitchen PROAIR HFA 108 (90 BASE) MCG/ACT inhaler Inhale 2 puffs into the lungs every 6 (six) hours as needed for wheezing or shortness of breath.      . QUEtiapine (SEROQUEL) 25 MG tablet Take 1 tablet (25 mg total) by mouth daily at 6 PM. 30 tablet 3   . rivaroxaban (XARELTO) 20 MG TABS tablet Take 1 tablet (20 mg total) by mouth daily with supper. Stop Xarelto until Monday (08/26/18) in anticipation for kyphoplasty 21 tablet 0   . TRELEGY ELLIPTA 100-62.5-25 MCG/INH AEPB Take 1 puff by mouth daily.  0    Scheduled: . albuterol  2.5 mg Nebulization Q6H  . Chlorhexidine Gluconate Cloth  6 each Topical Daily  . docusate sodium  100 mg Oral Daily  . fluticasone furoate-vilanterol  1 puff Inhalation Daily  . furosemide  60 mg Oral Daily  . guaiFENesin  600 mg Oral BID  . insulin aspart  0-5 Units Subcutaneous QHS  . insulin aspart  0-9 Units Subcutaneous TID WC  . metFORMIN  500 mg Oral Q breakfast  . methylPREDNISolone (SOLU-MEDROL) injection  80 mg Intravenous Q8H  . metoprolol tartrate  50 mg Oral BID  . potassium chloride SA  20 mEq Oral BID  . QUEtiapine  25 mg Oral q1800  . rivaroxaban  15 mg Oral Q breakfast  .  sodium chloride flush  3 mL Intravenous Q12H  . umeclidinium bromide  1 puff Inhalation Daily   Continuous: . sodium chloride    . azithromycin     [id number] chloride, acetaminophen **OR** acetaminophen, albuterol, polyethylene glycol, sodium chloride flush  Assesment: She was admitted with acute on chronic hypoxic and hypercapnic respiratory failure  She has had multiple hospitalizations in the last several months with similar problems.  At her last admission it appeared that part of the problem was that she was adjusting her medications on her own and is not clear if that is happened again.  I think she probably has some element of diastolic heart failure particularly considering the BNP elevation.  Systolic function was low normal on echocardiogram done in April of this year  She has severe COPD at baseline and has exacerbation  She has what looks like some atelectasis of the right lower lobe area on chest x-ray and that will be repeated. She is not had any fever.  She received Zithromax and Rocephin in the emergency department  She has diabetes and is on sliding scale  She has dementia which complicates her treatment  She is very high risk of needing intubation and mechanical ventilation and is critically ill  Principal Problem:   Acute respiratory failure with hypoxia and hypercapnia (HCC) Active Problems:   COPD with acute exacerbation ([place])   Atrial flutter ([place])   Diabetes mellitus ([place])  Plan: Continue BiPAP.  Check a blood gas this morning. Continue IV steroids.  Continue inhaled bronchodilators   LOS: 0 days   Alonza Bogus 11/16/2018, 8:00 AM

## 2018-11-16 NOTE — Progress Notes (Addendum)
PT TAKEN OFF BIPAP AND PLACED ON 5l O2 [place].O2 92%

## 2018-11-16 NOTE — Progress Notes (Signed)
*  PRELIMINARY RESULTS* Echocardiogram 2D Echocardiogram has been performed.  Leavy Cella 11/16/2018, 10:50 AM

## 2018-11-16 NOTE — ED Notes (Signed)
Pt son, Ronalee Belts at bedside.

## 2018-11-16 NOTE — H&P (Addendum)
TRH H&P    Patient Demographics:    Eileen [name], is a 73 y.o. female  MRN: [id number]  DOB - 10-01-1945  Admit Date - 11/15/2018  Referring MD/NP/PA: Merrily Pew  Outpatient Primary MD for the patient is Lavella Lemons, [place] - cardiology  Patient coming from:  home  Chief complaint-  hypoxia   HPI:    Eileen Mette  is a 73 y.o. female, w hx of right breast cancer in remission L 1 compression fracture s/p kyphoplasty, iron deficiency anemia, diverticulosis, dm2, Aflutter, moderate mitral regurgitation , mild- mod pulmonary hypertension (echo 03/22/2015), , Copd, chronic respiratory failure at 3.5L at baseline, w recent admission for Hcap and Copd exacerbation presents with hypoxia,  Pt found with pox 40% on 6L [place] and trying to rip off her oxygen per her son.   In ED,  T 98.3, P 109 R 40 pox 41% on RA,   CXR IMPRESSION: Streaky airspace opacity at the right lung base which could be due to atelectasis and/or early infectious etiology.  Wbc 12.8, Hgb 10.6, Plt 414 Na 139, K 5.2, Bun 20, Creatinine 1.21 Glucose 274 Ast 34, Alt 31 BNP 1,075  Trop 19-> 21  Ekg nsr at 90, nl axis, no st-t changes c/w ischemia  Abg  PH 7.299 Pco2 95.1, Po2 63 (venous)  Pt given solumedrol 125mg  iv x1, and rocephin 1gm iv x1, and zithromax 500mg  iv x1 in ED.   Pt will be admitted for acute respiratory failure with hypoxia and hypercapnea. And ? infiltrate   Review of systems:    In addition to the HPI above,  No Fever-chills, No Headache, No changes with Vision or hearing, No problems swallowing food or Liquids, No Chest pain, Cough  No Abdominal pain, No Nausea or Vomiting, bowel movements are regular, No Blood in stool or Urine, No dysuria, No new skin rashes or bruises, No new joints pains-aches,  No new weakness, tingling, numbness in any extremity, No recent weight gain or loss, No  polyuria, polydypsia or polyphagia, No significant Mental Stressors.  All other systems reviewed and are negative.    Past History of the following :    Past Medical History:  Diagnosis Date  . Asthma   . Atrial flutter ([place])   . Cancer [facility])    Right breast  . COPD (chronic obstructive pulmonary disease) ([place])   . History of breast cancer    right breast  . Hypertension   . On home O2   . Type 2 diabetes mellitus ([place])       Past Surgical History:  Procedure Laterality Date  . BREAST SURGERY  2011   Right breast mastectomy  . CARDIOVERSION N/A 06/27/2018   Procedure: CARDIOVERSION;  Surgeon: Arnoldo Lenis, MD;  [facility];  Service: Endoscopy;  Laterality: N/A;  . CESAREAN SECTION    . COLONOSCOPY N/A 01/22/2018   Procedure: COLONOSCOPY;  Surgeon: Rogene Houston, MD;  Location: [facility];  Service: Endoscopy;  Laterality: N/A;  . HERNIA  REPAIR     RIH  . IR VERTEBROPLASTY LUMBAR BX INC UNI/BIL INC/INJECT/IMAGING  09/05/2018  . MASTECTOMY  2011   right breast  . TEE WITHOUT CARDIOVERSION N/A 06/27/2018   Procedure: TRANSESOPHAGEAL ECHOCARDIOGRAM (TEE) WITH PROPOFOL;  Surgeon: Arnoldo Lenis, MD;  Location[facility];  Service: Endoscopy;  Laterality: N/A;      Social History:      Social History   Tobacco Use  . Smoking status: Former Smoker    Packs/day: 0.50    Years: 32.00    Pack years: 16.00    Types: Cigarettes    Start date: 12/12/1962    Quit date: 03/13/1994    Years since quitting: 24.6  . Smokeless tobacco: Never Used  Substance Use Topics  . Alcohol use: No    Alcohol/week: 0.0 standard drinks       Family History :     Family History  Problem Relation Age of Onset  . Cancer Mother        lung  . Diabetes Brother        Home Medications:   Prior to Admission medications   Medication Sig Start Date End Date Taking? Authorizing Provider  acetaminophen (TYLENOL) 500 MG tablet Take 500 mg by mouth every 8  (eight) hours as needed for mild pain or headache.     Lavella Lemons, PA  albuterol (PROVENTIL) (2.5 MG/3ML) 0.083% nebulizer solution Take 2.5 mg by nebulization every 6 (six) hours as needed for wheezing or shortness of breath.    [provider]  docusate sodium (COLACE) 100 MG capsule Take 1 capsule (100 mg total) by mouth 2 (two) times daily. Patient taking differently: Take 100 mg by mouth daily.  08/22/18   Barton Dubois, MD  furosemide (LASIX) 40 MG tablet Take 1.5 tablets (60 mg total) by mouth daily. 11/11/18   Manuella Ghazi, Pratik D, DO  guaiFENesin (MUCINEX) 600 MG 12 hr tablet Take 1 tablet (600 mg total) by mouth 2 (two) times daily. 08/22/18   Barton Dubois, MD  metFORMIN (GLUCOPHAGE-XR) 500 MG 24 hr tablet Take 500 mg by mouth daily with breakfast.  08/10/17   [provider]  metoprolol tartrate (LOPRESSOR) 50 MG tablet Take 50 mg by mouth 2 (two) times daily. 04/12/18   [provider]  polyethylene glycol (MIRALAX / GLYCOLAX) 17 g packet Take 17 g by mouth daily as needed for mild constipation. 08/22/18   Barton Dubois, MD  potassium chloride SA (K-DUR) 20 MEQ tablet Take 20 mEq by mouth 2 (two) times daily.    [provider]  PROAIR HFA 108 (90 BASE) MCG/ACT inhaler Inhale 2 puffs into the lungs every 6 (six) hours as needed for wheezing or shortness of breath.  03/06/11   [provider]  QUEtiapine (SEROQUEL) 25 MG tablet Take 1 tablet (25 mg total) by mouth daily at 6 PM. 11/11/18 12/11/18  Manuella Ghazi, Pratik D, DO  rivaroxaban (XARELTO) 20 MG TABS tablet Take 1 tablet (20 mg total) by mouth daily with supper. Stop Xarelto until Monday (08/26/18) in anticipation for kyphoplasty 08/22/18   Barton Dubois, MD  TRELEGY ELLIPTA 100-62.5-25 MCG/INH AEPB Take 1 puff by mouth daily. 08/27/17   [provider]     Allergies:     Allergies  Allergen Reactions  . Codeine Other (See Comments)    "jittery"     Physical Exam:   Vitals  Blood  pressure 114/72, pulse (!) 105, temperature 98.3 F (36.8 C),  temperature source Oral, resp. rate 19, SpO2 93 %.  1.  General: somnolent  2. Psychiatric: euthymic  3. Neurologic: cn2-12 intact, reflexes 2+ symmetric, diffuse with no clonus  4. HEENMT:  Anicteric, pupils 1.59mm symmetric, direc,t consensual, near intact Neck: no jvd  5. Respiratory : + bilateral exp wheezing, no crackles  6. Cardiovascular : rrr s1, s2,   7. Gastrointestinal:  Abd: soft, nt, nd, +bs  8. Skin:  Ext: no c/c/e,  No rash  9.Musculoskeletal:  Good ROM      Data Review:    CBC Recent Labs  Lab 11/09/18 0431 11/10/18 0021 11/10/18 0503 11/11/18 0448 11/15/18 2330  WBC 10.9* 8.0 6.9 8.4 12.8*  HGB 11.9* 11.0* 11.6* 12.0 10.6*  HCT 42.5 38.6 41.0 43.7 39.1  PLT 257 223 263 260 414*  MCV 100.5* 98.5 98.8 100.0 101.8*  MCH 28.1 28.1 28.0 27.5 27.6  MCHC 28.0* 28.5* 28.3* 27.5* 27.1*  RDW 14.5 14.4 14.3 14.2 14.2  LYMPHSABS  --  1.2  --   --  1.3  MONOABS  --  1.1*  --   --  1.6*  EOSABS  --  0.1  --   --  0.1  BASOSABS  --  0.0  --   --  0.1   ------------------------------------------------------------------------------------------------------------------  Results for orders placed or performed during the hospital encounter of 11/15/18 (from the past 48 hour(s))  SARS Coronavirus 2 [facility] order, Performed in [facility] hospital lab)     Status: None   Collection Time: 11/15/18 11:10 PM  Result Value Ref Range   SARS Coronavirus 2 NEGATIVE NEGATIVE    Comment: (NOTE) If result is NEGATIVE SARS-CoV-2 target nucleic acids are NOT DETECTED. The SARS-CoV-2 RNA is generally detectable in upper and lower  respiratory specimens during the acute phase of infection. The lowest  concentration of SARS-CoV-2 viral copies this assay can detect is 250  copies / mL. A negative result does not preclude SARS-CoV-2 infection  and should not be used as the sole basis for treatment or other   patient management decisions.  A negative result may occur with  improper specimen collection / handling, submission of specimen other  than nasopharyngeal swab, presence of viral mutation(s) within the  areas targeted by this assay, and inadequate number of viral copies  (<250 copies / mL). A negative result must be combined with clinical  observations, patient history, and epidemiological information. If result is POSITIVE SARS-CoV-2 target nucleic acids are DETECTED. The SARS-CoV-2 RNA is generally detectable in upper and lower  respiratory specimens dur ing the acute phase of infection.  Positive  results are indicative of active infection with SARS-CoV-2.  Clinical  correlation with patient history and other diagnostic information is  necessary to determine patient infection status.  Positive results do  not rule out bacterial infection or co-infection with other viruses. If result is PRESUMPTIVE POSTIVE SARS-CoV-2 nucleic acids MAY BE PRESENT.   A presumptive positive result was obtained on the submitted specimen  and confirmed on repeat testing.  While 2019 novel coronavirus  (SARS-CoV-2) nucleic acids may be present in the submitted sample  additional confirmatory testing may be necessary for epidemiological  and / or clinical management purposes  to differentiate between  SARS-CoV-2 and other Sarbecovirus currently known to infect humans.  If clinically indicated additional testing with an alternate test  methodology [phone]) is advised. The SARS-CoV-2 RNA is generally  detectable in upper and lower respiratory sp ecimens during the acute  phase of infection. The expected result is Negative. Fact Sheet for Patients:  StrictlyIdeas.no Fact Sheet for Healthcare Providers: BankingDealers.co.za This test is not yet approved or cleared by the [place] FDA and has been authorized for detection and/or diagnosis of SARS-CoV-2 by  FDA under an Emergency Use Authorization (EUA).  This EUA will remain in effect (meaning this test can be used) for the duration of the COVID-19 declaration under Section 564(b)(1) of the Act, 21 U.S.C. section 360bbb-3(b)(1), unless the authorization is terminated or revoked sooner. Performed at [facility], [address], [place], [place] [address]   CBC with Differential     Status: Abnormal   Collection Time: 11/15/18 11:30 PM  Result Value Ref Range   WBC 12.8 (H) 4.0 - 10.5 K/uL   RBC 3.84 (L) 3.87 - 5.11 MIL/uL   Hemoglobin 10.6 (L) 12.0 - 15.0 g/dL   HCT 39.1 36.0 - 46.0 %   MCV 101.8 (H) 80.0 - 100.0 fL   MCH 27.6 26.0 - 34.0 pg   MCHC 27.1 (L) 30.0 - 36.0 g/dL   RDW 14.2 11.5 - 15.5 %   Platelets 414 (H) 150 - 400 K/uL   nRBC 0.2 0.0 - 0.2 %   Neutrophils Relative % 75 %   Neutro Abs 9.6 (H) 1.7 - 7.7 K/uL   Lymphocytes Relative 10 %   Lymphs Abs 1.3 0.7 - 4.0 K/uL   Monocytes Relative 13 %   Monocytes Absolute 1.6 (H) 0.1 - 1.0 K/uL   Eosinophils Relative 1 %   Eosinophils Absolute 0.1 0.0 - 0.5 K/uL   Basophils Relative 0 %   Basophils Absolute 0.1 0.0 - 0.1 K/uL   Immature Granulocytes 1 %   Abs Immature Granulocytes 0.13 (H) 0.00 - 0.07 K/uL    Comment: Performed at [facility], [address] [place], [place] [address]  Comprehensive metabolic panel     Status: Abnormal   Collection Time: 11/15/18 11:30 PM  Result Value Ref Range   Sodium 139 135 - 145 mmol/L   Potassium 5.2 (H) 3.5 - 5.1 mmol/L   Chloride 86 (L) 98 - 111 mmol/L   CO2 38 (H) 22 - 32 mmol/L   Glucose, Bld 274 (H) 70 - 99 mg/dL   BUN 20 8 - 23 mg/dL   Creatinine, Ser 1.21 (H) 0.44 - 1.00 mg/dL   Calcium 9.9 8.9 - 10.3 mg/dL   Total Protein 7.1 6.5 - 8.1 g/dL   Albumin 3.5 3.5 - 5.0 g/dL   AST 34 15 - 41 U/L   ALT 31 0 - 44 U/L   Alkaline Phosphatase 90 38 - 126 U/L   Total Bilirubin 0.3 0.3 - 1.2 mg/dL   GFR calc non Af Amer 44 (L) >60 mL/min   GFR calc Af Amer 51 (L) >60 mL/min    Anion gap 15 5 - 15    Comment: Performed at [facility], [address] [place], [place] [address]  Brain natriuretic peptide     Status: Abnormal   Collection Time: 11/15/18 11:30 PM  Result Value Ref Range   B Natriuretic Peptide 1,075.0 (H) 0.0 - 100.0 pg/mL    Comment: Performed at [facility], [address], [place], [place] [address]  Troponin I (High Sensitivity)     Status: Abnormal   Collection Time: 11/15/18 11:30 PM  Result Value Ref Range   Troponin I (High Sensitivity) 19 (H) <18 ng/L    Comment: (NOTE) Elevated high sensitivity troponin I (  hsTnI) values and significant  changes across serial measurements may suggest ACS but many other  chronic and acute conditions are known to elevate hsTnI results.  Refer to the "Links" section for chest pain algorithms and additional  guidance. Performed at [facility], [address], [place], [place] [address]   Blood gas, venous     Status: Abnormal (Preliminary result)   Collection Time: 11/16/18  1:11 AM  Result Value Ref Range   FIO2 PENDING    pH, Ven 7.299 7.250 - 7.430   pCO2, Ven 95.1 (HH) 44.0 - 60.0 mmHg    Comment: CRITICAL RESULT CALLED TO, READ BACK BY AND VERIFIED WITH: WALKER,T @ 0132 ON 11/16/18 BY JUW    pO2, Ven 63.1 (H) 32.0 - 45.0 mmHg   Bicarbonate 39.1 (H) 20.0 - 28.0 mmol/L   Acid-Base Excess 18.2 (H) 0.0 - 2.0 mmol/L   O2 Saturation 89.2 %   Patient temperature 37.0     Comment: Performed at [facility], [address] [place], [place] [address]  Troponin I (High Sensitivity)     Status: Abnormal   Collection Time: 11/16/18  1:48 AM  Result Value Ref Range   Troponin I (High Sensitivity) 21 (H) <18 ng/L    Comment: (NOTE) Elevated high sensitivity troponin I (hsTnI) values and significant  changes across serial measurements may suggest ACS but many other  chronic and acute conditions are known to elevate hsTnI results.  Refer to the "Links" section for chest pain algorithms and additional   guidance. Performed at [facility], [address], [place], [place] [address]     Chemistries  Recent Labs  Lab 11/09/18 [phone] 11/10/18 0503 11/11/18 0448 11/15/18 2330  NA 142 141 142 139  K 4.1 3.1* 4.0 5.2*  CL 85* 85* 89* 86*  CO2 36* 41* 38* 38*  GLUCOSE 67* 118* 90 274*  BUN 16 16 13 20   CREATININE 0.83 0.84 0.71 1.21*  CALCIUM 9.5 9.0 9.4 9.9  MG  --   --  1.8  --   AST  --   --   --  34  ALT  --   --   --  31  ALKPHOS  --   --   --  90  BILITOT  --   --   --  0.3   ------------------------------------------------------------------------------------------------------------------  ------------------------------------------------------------------------------------------------------------------ GFR: Estimated Creatinine Clearance: 44 mL/min (A) (by C-G formula based on SCr of 1.21 mg/dL (H)). Liver Function Tests: Recent Labs  Lab 11/15/18 2330  AST 34  ALT 31  ALKPHOS 90  BILITOT 0.3  PROT 7.1  ALBUMIN 3.5   No results for input(s): LIPASE, AMYLASE in the last 168 hours. No results for input(s): AMMONIA in the last 168 hours. Coagulation Profile: No results for input(s): INR, PROTIME in the last 168 hours. Cardiac Enzymes: No results for input(s): CKTOTAL, CKMB, CKMBINDEX, TROPONINI in the last 168 hours. BNP (last 3 results) No results for input(s): PROBNP in the last 8760 hours. HbA1C: No results for input(s): HGBA1C in the last 72 hours. CBG: Recent Labs  Lab 11/10/18 1119 11/10/18 1611 11/10/18 2133 11/11/18 0749 11/11/18 1206  GLUCAP 232* 79 123* 93 200*   Lipid Profile: No results for input(s): CHOL, HDL, LDLCALC, TRIG, CHOLHDL, LDLDIRECT in the last 72 hours. Thyroid Function Tests: No results for input(s): TSH, T4TOTAL, FREET4, T3FREE, THYROIDAB in the last 72 hours. Anemia Panel: No results for input(s): VITAMINB12, FOLATE, FERRITIN, TIBC, IRON, RETICCTPCT in the last 72 hours.   ---------------------------------------------------------------------------------------------------------------  Urine analysis:    Component Value Date/Time   COLORURINE YELLOW 11/06/2018 1638   APPEARANCEUR CLEAR 11/06/2018 1638   LABSPEC 1.009 11/06/2018 1638   PHURINE 5.0 11/06/2018 1638   GLUCOSEU NEGATIVE 11/06/2018 1638   HGBUR NEGATIVE 11/06/2018 [place] 11/06/2018 [place] 11/06/2018 1638   PROTEINUR 30 (A) 11/06/2018 1638   UROBILINOGEN 0.2 09/09/2009 1010   NITRITE POSITIVE (A) 11/06/2018 1638   LEUKOCYTESUR TRACE (A) 11/06/2018 1638      Imaging Results:    Dg Chest Portable 1 View  Result Date: 11/16/2018 CLINICAL DATA:  Respiratory distress EXAM: PORTABLE CHEST 1 VIEW COMPARISON:  November 06, 2018 FINDINGS: There is mild cardiomegaly. Streaky airspace opacity seen at the right lung base. The left lung is clear. No acute osseous abnormality. IMPRESSION: Streaky airspace opacity at the right lung base which could be due to atelectasis and/or early infectious etiology. Electronically Signed   By: Prudencio Pair M.D.   On: 11/16/2018 00:07       Assessment & Plan:    Principal Problem:   Acute respiratory failure with hypoxia and hypercapnia (HCC) Active Problems:   COPD with acute exacerbation (HCC)   Atrial flutter (HCC)   Diabetes mellitus ([place])   Acute respiratory failure with hypoxia and hypercapnea Secondary to COPD exacerbation, pulmonary hypertension  Atelectasis at the right lung base, present on prior CXR  COPD exacerbation, Pt is a CO2 retainer, keep o2 sat <96% Bipap per respiratory protocol Solu-Medrol 80 mg IV 3 times daily Zithromax 500mg  iv qday Continue Trelegy Albuterol neb q6h and q6h prn  Elevated Troponin likely due to demand, from hypoxia Cycle cardiac marker Check cardiac echo    Atrial flutter, moderate MR Continue Xarelto pharmacy to dose Continue Lopressor 50 mg p.o. twice daily  Edema  Continue Lasix 60 mg p.o. daily Continue potassium chloride Check CMP in a.m.  Diabetes type 2(Hga1c=8.5 on 09/29/2018) Continue metformin Fingerstick blood sugars before meals and nightly, insulin sliding scale(sensitive)  Anemia, history of diverticulosis Check CBC in a.m.  History of L1 compression fracture status post vertebroplasty Follow-up with PCP, will need regular bone density testing as well as possible anti-resorptive therapy  ? Dementia (per son) Seroquel 25mg  po qhs  DVT Prophylaxis-   Xarelto AM Labs Ordered, also please review Full Orders  Family Communication: Admission, patients condition and plan of care including tests being ordered have been discussed with the patient  who indicate understanding and agree with the plan and Code Status.  Code Status:  FULL CODE  per patient and son "Legrand [place]" ,  Son is present with mother in ER.   Admission status:  Inpatient: Based on patients clinical presentation and evaluation of above clinical data, I have made determination that patient meets Inpatient criteria at this time.  Pox 44% on entry to ED.  Pt will be admitted for acute respiratory failure w hypoxia, and hypercapnea.  Pt requiring Bipap to maintain o2 sat,  Pt will require iv solumedrol and iv abx.  Pt will require > 2 nites stay for respiratory failure.   Time spent in minutes : 70   Jani Gravel M.D on 11/16/2018 at 3:30 AM

## 2018-11-16 NOTE — Progress Notes (Signed)
ASSUMPTION OF CARE NOTE   11/16/2018 9:52 AM  Eileen [name] was seen and examined.  The H&P by the admitting provider, orders, imaging was reviewed.  Please see new orders.  Will continue to follow.   Vitals:   11/16/18 0820 11/16/18 0900  BP:    Pulse: 86   Resp: 18   Temp:  98.3 F (36.8 C)  SpO2: 95%     Results for orders placed or performed during the hospital encounter of 11/15/18  SARS Coronavirus 2 [facility] order, Performed in [facility] hospital lab)  Result Value Ref Range   SARS Coronavirus 2 NEGATIVE NEGATIVE  CBC with Differential  Result Value Ref Range   WBC 12.8 (H) 4.0 - 10.5 K/uL   RBC 3.84 (L) 3.87 - 5.11 MIL/uL   Hemoglobin 10.6 (L) 12.0 - 15.0 g/dL   HCT 39.1 36.0 - 46.0 %   MCV 101.8 (H) 80.0 - 100.0 fL   MCH 27.6 26.0 - 34.0 pg   MCHC 27.1 (L) 30.0 - 36.0 g/dL   RDW 14.2 11.5 - 15.5 %   Platelets 414 (H) 150 - 400 K/uL   nRBC 0.2 0.0 - 0.2 %   Neutrophils Relative % 75 %   Neutro Abs 9.6 (H) 1.7 - 7.7 K/uL   Lymphocytes Relative 10 %   Lymphs Abs 1.3 0.7 - 4.0 K/uL   Monocytes Relative 13 %   Monocytes Absolute 1.6 (H) 0.1 - 1.0 K/uL   Eosinophils Relative 1 %   Eosinophils Absolute 0.1 0.0 - 0.5 K/uL   Basophils Relative 0 %   Basophils Absolute 0.1 0.0 - 0.1 K/uL   Immature Granulocytes 1 %   Abs Immature Granulocytes 0.13 (H) 0.00 - 0.07 K/uL  Comprehensive metabolic panel  Result Value Ref Range   Sodium 139 135 - 145 mmol/L   Potassium 5.2 (H) 3.5 - 5.1 mmol/L   Chloride 86 (L) 98 - 111 mmol/L   CO2 38 (H) 22 - 32 mmol/L   Glucose, Bld 274 (H) 70 - 99 mg/dL   BUN 20 8 - 23 mg/dL   Creatinine, Ser 1.21 (H) 0.44 - 1.00 mg/dL   Calcium 9.9 8.9 - 10.3 mg/dL   Total Protein 7.1 6.5 - 8.1 g/dL   Albumin 3.5 3.5 - 5.0 g/dL   AST 34 15 - 41 U/L   ALT 31 0 - 44 U/L   Alkaline Phosphatase 90 38 - 126 U/L   Total Bilirubin 0.3 0.3 - 1.2 mg/dL   GFR calc non Af Amer 44 (L) >60 mL/min   GFR calc Af Amer 51 (L) >60 mL/min   Anion gap 15  5 - 15  Brain natriuretic peptide  Result Value Ref Range   B Natriuretic Peptide 1,075.0 (H) 0.0 - 100.0 pg/mL  Blood gas, venous  Result Value Ref Range   FIO2 50.00    pH, Ven 7.299 7.250 - 7.430   pCO2, Ven 95.1 (HH) 44.0 - 60.0 mmHg   pO2, Ven 63.1 (H) 32.0 - 45.0 mmHg   Bicarbonate 39.1 (H) 20.0 - 28.0 mmol/L   Acid-Base Excess 18.2 (H) 0.0 - 2.0 mmol/L   O2 Saturation 89.2 %   Patient temperature 37.0   Comprehensive metabolic panel  Result Value Ref Range   Sodium 137 135 - 145 mmol/L   Potassium 5.1 3.5 - 5.1 mmol/L   Chloride 89 (L) 98 - 111 mmol/L   CO2 35 (H) 22 - 32 mmol/L   Glucose, Bld  193 (H) 70 - 99 mg/dL   BUN 21 8 - 23 mg/dL   Creatinine, Ser 1.05 (H) 0.44 - 1.00 mg/dL   Calcium 9.1 8.9 - 10.3 mg/dL   Total Protein 6.3 (L) 6.5 - 8.1 g/dL   Albumin 3.1 (L) 3.5 - 5.0 g/dL   AST 24 15 - 41 U/L   ALT 26 0 - 44 U/L   Alkaline Phosphatase 74 38 - 126 U/L   Total Bilirubin 0.6 0.3 - 1.2 mg/dL   GFR calc non Af Amer 53 (L) >60 mL/min   GFR calc Af Amer >60 >60 mL/min   Anion gap 13 5 - 15  CBC  Result Value Ref Range   WBC 8.1 4.0 - 10.5 K/uL   RBC 3.56 (L) 3.87 - 5.11 MIL/uL   Hemoglobin 9.9 (L) 12.0 - 15.0 g/dL   HCT 36.1 36.0 - 46.0 %   MCV 101.4 (H) 80.0 - 100.0 fL   MCH 27.8 26.0 - 34.0 pg   MCHC 27.4 (L) 30.0 - 36.0 g/dL   RDW 14.1 11.5 - 15.5 %   Platelets 348 150 - 400 K/uL   nRBC 0.0 0.0 - 0.2 %  Blood gas, arterial  Result Value Ref Range   FIO2 40.00    pH, Arterial 7.324 (L) 7.350 - 7.450   pCO2 arterial 77.5 (HH) 32.0 - 48.0 mmHg   pO2, Arterial 67.4 (L) 83.0 - 108.0 mmHg   Bicarbonate 34.9 (H) 20.0 - 28.0 mmol/L   Acid-Base Excess 12.7 (H) 0.0 - 2.0 mmol/L   O2 Saturation 92.3 %   Patient temperature 36.7    Allens test (pass/fail) PASS PASS  Glucose, capillary  Result Value Ref Range   Glucose-Capillary 249 (H) 70 - 99 mg/dL  Troponin I (High Sensitivity)  Result Value Ref Range   Troponin I (High Sensitivity) 19 (H) <18 ng/L   Troponin I (High Sensitivity)  Result Value Ref Range   Troponin I (High Sensitivity) 21 (H) <18 ng/L   C. Wynetta Emery, MD [facility]   11/15/2018 11:03 PM How to contact the [facility] Attending or Consulting provider [place] or covering provider during after hours [place], for this patient?  1. Check the care team in [facility] and look for a) attending/consulting [facility] provider listed and b) the [facility] team listed 2. Log into [email or web address] and use [place]'s universal password to access. If you do not have the password, please contact the hospital operator. 3. Locate the [facility] provider you are looking for under [facility] and page to a number that you can be directly reached. 4. If you still have difficulty reaching the provider, please page the [facility] (Director on Call) for the Hospitalists listed on amion for assistance.

## 2018-11-16 NOTE — Progress Notes (Signed)
ANTICOAGULATION CONSULT NOTE - Initial Consult  Pharmacy Consult for Xarelto Indication: Paroxysmal atrial flutter  Allergies  Allergen Reactions  . Codeine Other (See Comments)    "jittery"    Patient Measurements:   Heparin Dosing Weight: 75.8 kg  Vital Signs: Temp: 98.3 F (36.8 C) (09/04 2337) Temp Source: Oral (09/04 2337) BP: 114/72 (09/05 0200) Pulse Rate: 105 (09/05 0200)  Labs: Recent Labs    11/15/18 2330 11/16/18 0148  HGB 10.6*  --   HCT 39.1  --   PLT 414*  --   CREATININE 1.21*  --   TROPONINIHS 19* 21*    Estimated Creatinine Clearance: 44 mL/min (A) (by C-G formula based on SCr of 1.21 mg/dL (H)).   Medical History: Past Medical History:  Diagnosis Date  . Asthma   . Atrial flutter ([place])   . Cancer [facility])    Right breast  . COPD (chronic obstructive pulmonary disease) ([place])   . History of breast cancer    right breast  . Hypertension   . On home O2   . Type 2 diabetes mellitus ([place])     Assessment:  Eileen [name]  is a 73 y.o. female on Xarelto 20 mg daily PTA for atrial flutter. Pharmacy consulted to dose/monitor while inpatient. Patient currently with AKI. CrCl < 50. Patient unable to communicate when she took last dose,  but was brought in 9/4 evening.  Goal of Therapy:  Anticoagulation Monitor platelets by anticoagulation protocol: Yes   Plan:  Reduce Xarelto to 15 mg PO daily with breakfast Monitor renal function, CBC, and for signs and symptoms of bleeding   Thank you for allowing [place] to participate in this patients care. Jens Som, PharmD 11/16/2018,2:56 AM

## 2018-11-17 ENCOUNTER — Other Ambulatory Visit: Payer: Self-pay

## 2018-11-17 ENCOUNTER — Inpatient Hospital Stay (HOSPITAL_COMMUNITY): Payer: Medicare Other

## 2018-11-17 LAB — GLUCOSE, CAPILLARY
Glucose-Capillary: 232 mg/dL — ABNORMAL HIGH (ref 70–99)
Glucose-Capillary: 120 mg/dL — ABNORMAL HIGH (ref 70–99)
Glucose-Capillary: 202 mg/dL — ABNORMAL HIGH (ref 70–99)
Glucose-Capillary: 190 mg/dL — ABNORMAL HIGH (ref 70–99)

## 2018-11-17 MED ORDER — METOPROLOL TARTRATE 25 MG PO TABS
25.0000 mg | ORAL_TABLET | Freq: Two times a day (BID) | ORAL | Status: DC
  Administered 2018-11-17 – 2018-11-18 (×4): 25 mg via ORAL
  Filled 2018-11-17 (×4): qty 1

## 2018-11-17 MED ORDER — ORAL CARE MOUTH RINSE
15.0000 mL | Freq: Two times a day (BID) | OROMUCOSAL | Status: DC
  Administered 2018-11-17 – 2018-11-22 (×10): 15 mL via OROMUCOSAL

## 2018-11-17 MED ORDER — METHYLPREDNISOLONE SODIUM SUCC 125 MG IJ SOLR
60.0000 mg | Freq: Three times a day (TID) | INTRAMUSCULAR | Status: DC
  Administered 2018-11-17 – 2018-11-18 (×4): 60 mg via INTRAVENOUS
  Filled 2018-11-17 (×4): qty 2

## 2018-11-17 MED ORDER — INSULIN ASPART 100 UNIT/ML ~~LOC~~ SOLN
6.0000 [IU] | Freq: Three times a day (TID) | SUBCUTANEOUS | Status: DC
  Administered 2018-11-17 – 2018-11-18 (×4): 6 [IU] via SUBCUTANEOUS

## 2018-11-17 NOTE — Progress Notes (Signed)
PROGRESS NOTE  Eileen [name]  [id number]  DOB: 09-06-1945  DOA: 11/15/2018 PCP: Lavella Lemons, PA   Brief Admission Hx: 73 y/o female with pulmonary hypertension, COPD, chronic respiratory failure on 3.5 L oxygen at baseline presented with severe hypoxia and acute on chronic respiratory failure with hypoxia and severe COPD exacerbation requiring BiPAP therapy and stepdown ICU placement.  MDM/Assessment & Plan:   1. Acute on chronic respiratory failure with hypoxia-secondary to severe acute COPD exacerbation-patient has been weaned off BiPAP at this time and clinically is improving.  She reports that her shortness of breath is slightly improved.  Continue current treatments appreciate pulmonary consultation. 2. COPD exacerbation-continue IV steroids scheduled nebs and antibiotics. 3. Type 2 diabetes mellitus, poorly controlled as evidenced by hemoglobin A1c of 8.5%- steroid-induced hyperglycemia, continue to titrate insulin doses to for better glycemic control.  Monitor CBG 5 times per day and continue supplemental sliding scale coverage. 4. Atrial flutter-continue Xarelto daily and continue Lopressor 50 mg twice daily 5. Mild acute diastolic CHF exacerbation-patient is diuresing well with IV Lasix and we have reduced the dose today continue to follow electrolytes closely.  Continue to follow daily weights. 6. Status post L1 compression fracture status post vertebroplasty - follow up with PCP.  7. Mild troponin elevation - secondary to demand ischemia.   8. History of dementia - resumed home seroquel.   DVT prophylaxis: xarelto  Code Status: full  Family Communication: patient updated bedside  Disposition Plan: continue stepdown ICU care   Consultants:  Pulmonology (Dr. Luan Pulling)  Procedures:     Antimicrobials:     Subjective: Pt reports that she has less SOB today.  She wants to start eating.   Objective: Vitals:   11/17/18 0753 11/17/18 0800 11/17/18 0812 11/17/18  0819  BP:  107/72  107/72  Pulse: 77 83  82  Resp: (!) 26 (!) 26  (!) 21  Temp:    98 F (36.7 C)  TempSrc:    Axillary  SpO2: 100%  96%   Weight:    70.3 kg  Height:    5\' 7"  (1.702 m)    Intake/Output Summary (Last 24 hours) at 11/17/2018 1050 Last data filed at 11/17/2018 0500 Gross per 24 hour  Intake -  Output 200 ml  Net -200 ml   Filed Weights   11/16/18 0434 11/17/18 0500 11/17/18 0819  Weight: 68.4 kg 70.3 kg 70.3 kg   REVIEW OF SYSTEMS  As per history otherwise all reviewed and reported negative  Exam:  General exam: awake, alert, NAD cooperative.  Respiratory system: poor air movement. No increased work of breathing. Cardiovascular system: S1 & S2 heard. No JVD, murmurs, gallops, clicks or pedal edema. Gastrointestinal system: Abdomen is nondistended, soft and nontender. Normal bowel sounds heard. Central nervous system: Alert and oriented. No focal neurological deficits. Extremities: 1+ pretibial edema BLEs.  Data Reviewed: Basic Metabolic Panel: Recent Labs  Lab 11/11/18 0448 11/15/18 2330 11/16/18 0508 11/16/18 1312  NA 142 139 137 138  K 4.0 5.2* 5.1 4.9  CL 89* 86* 89* 86*  CO2 38* 38* 35* 36*  GLUCOSE 90 274* 193* 258*  BUN 13 20 21  25*  CREATININE 0.71 1.21* 1.05* 1.12*  CALCIUM 9.4 9.9 9.1 9.4  MG 1.8  --   --   --    Liver Function Tests: Recent Labs  Lab 11/15/18 2330 11/16/18 0508  AST 34 24  ALT 31 26  ALKPHOS 90 74  BILITOT 0.3 0.6  PROT 7.1 6.3*  ALBUMIN 3.5 3.1*   No results for input(s): LIPASE, AMYLASE in the last 168 hours. No results for input(s): AMMONIA in the last 168 hours. CBC: Recent Labs  Lab 11/11/18 0448 11/15/18 2330 11/16/18 0508  WBC 8.4 12.8* 8.1  NEUTROABS  --  9.6*  --   HGB 12.0 10.6* 9.9*  HCT 43.7 39.1 36.1  MCV 100.0 101.8* 101.4*  PLT 260 414* 348   Cardiac Enzymes: No results for input(s): CKTOTAL, CKMB, CKMBINDEX, TROPONINI in the last 168 hours. CBG (last 3)  Recent Labs    11/16/18  1617 11/16/18 2056 11/17/18 0732  GLUCAP 238* 242* 232*   Recent Results (from the past 240 hour(s))  SARS Coronavirus 2 [facility] order, Performed in [place] hospital lab)     Status: None   Collection Time: 11/15/18 11:10 PM  Result Value Ref Range Status   SARS Coronavirus 2 NEGATIVE NEGATIVE Final    Comment: (NOTE) If result is NEGATIVE SARS-CoV-2 target nucleic acids are NOT DETECTED. The SARS-CoV-2 RNA is generally detectable in upper and lower  respiratory specimens during the acute phase of infection. The lowest  concentration of SARS-CoV-2 viral copies this assay can detect is 250  copies / mL. A negative result does not preclude SARS-CoV-2 infection  and should not be used as the sole basis for treatment or other  patient management decisions.  A negative result may occur with  improper specimen collection / handling, submission of specimen other  than nasopharyngeal swab, presence of viral mutation(s) within the  areas targeted by this assay, and inadequate number of viral copies  (<250 copies / mL). A negative result must be combined with clinical  observations, patient history, and epidemiological information. If result is POSITIVE SARS-CoV-2 target nucleic acids are DETECTED. The SARS-CoV-2 RNA is generally detectable in upper and lower  respiratory specimens dur ing the acute phase of infection.  Positive  results are indicative of active infection with SARS-CoV-2.  Clinical  correlation with patient history and other diagnostic information is  necessary to determine patient infection status.  Positive results do  not rule out bacterial infection or co-infection with other viruses. If result is PRESUMPTIVE POSTIVE SARS-CoV-2 nucleic acids MAY BE PRESENT.   A presumptive positive result was obtained on the submitted specimen  and confirmed on repeat testing.  While 2019 novel coronavirus  (SARS-CoV-2) nucleic acids may be present in the submitted sample   additional confirmatory testing may be necessary for epidemiological  and / or clinical management purposes  to differentiate between  SARS-CoV-2 and other Sarbecovirus currently known to infect humans.  If clinically indicated additional testing with an alternate test  methodology [phone]) is advised. The SARS-CoV-2 RNA is generally  detectable in upper and lower respiratory sp ecimens during the acute  phase of infection. The expected result is Negative. Fact Sheet for Patients:  StrictlyIdeas.no Fact Sheet for Healthcare Providers: BankingDealers.co.za This test is not yet approved or cleared by the [place] FDA and has been authorized for detection and/or diagnosis of SARS-CoV-2 by FDA under an Emergency Use Authorization (EUA).  This EUA will remain in effect (meaning this test can be used) for the duration of the COVID-19 declaration under Section 564(b)(1) of the Act, 21 U.S.C. section 360bbb-3(b)(1), unless the authorization is terminated or revoked sooner. Performed at [facility], [address] [place], [place] [address]      Studies: Dg Chest [place] 1 View  Result Date: 11/17/2018 CLINICAL DATA:  Acute respiratory failure.  History of COPD. EXAM: PORTABLE CHEST 1 VIEW COMPARISON:  Single-view of the chest 11/15/2018 and 11/06/2018. CT chest 09/27/2018. FINDINGS: The lungs are emphysematous. Right basilar opacity seen on the most recent examination has resolved. Minimal atelectasis left lung base noted. Heart size is enlarged. No pneumothorax or pleural fluid. IMPRESSION: Resolved right basilar airspace opacity. No change in left basilar subsegmental atelectasis. Emphysema. Atherosclerosis. Electronically Signed   By: Inge Rise M.D.   On: 11/17/2018 09:06   Dg Chest Portable 1 View  Result Date: 11/16/2018 CLINICAL DATA:  Respiratory distress EXAM: PORTABLE CHEST 1 VIEW COMPARISON:  November 06, 2018 FINDINGS: There is  mild cardiomegaly. Streaky airspace opacity seen at the right lung base. The left lung is clear. No acute osseous abnormality. IMPRESSION: Streaky airspace opacity at the right lung base which could be due to atelectasis and/or early infectious etiology. Electronically Signed   By: Prudencio Pair M.D.   On: 11/16/2018 00:07     Scheduled Meds: . albuterol  2.5 mg Nebulization Q6H  . Chlorhexidine Gluconate Cloth  6 each Topical Daily  . docusate sodium  100 mg Oral Daily  . fluticasone furoate-vilanterol  1 puff Inhalation Daily  . furosemide  40 mg Intravenous Daily  . guaiFENesin  600 mg Oral BID  . insulin aspart  0-20 Units Subcutaneous TID WC  . insulin aspart  0-5 Units Subcutaneous QHS  . insulin aspart  6 Units Subcutaneous TID WC  . insulin glargine  15 Units Subcutaneous Daily  . methylPREDNISolone (SOLU-MEDROL) injection  60 mg Intravenous Q8H  . metoprolol tartrate  25 mg Oral BID  . potassium chloride SA  20 mEq Oral BID  . QUEtiapine  25 mg Oral q1800  . rivaroxaban  15 mg Oral Q breakfast  . sodium chloride flush  3 mL Intravenous Q12H  . umeclidinium bromide  1 puff Inhalation Daily   Continuous Infusions: . sodium chloride    . azithromycin 500 mg (11/17/18 0215)    Principal Problem:   Acute respiratory failure with hypoxia and hypercapnia (HCC) Active Problems:   COPD with acute exacerbation (HCC)   Atrial flutter ([place])   Diabetes mellitus ([place])  Critical care Time spent: 31 mins   Irwin Brakeman, MD [facility] 11/17/2018, 10:50 AM    LOS: 1 day  How to contact the [facility] Attending or Consulting provider [place] or covering provider during after hours [place], for this patient?  1. Check the care team in [facility] and look for a) attending/consulting TR[facility] provider listed and b) the [facility] team listed 2. Log into [email or web address] and use [place]'s universal password to access. If you do not have the password, please contact the hospital operator. 3. Locate the  [facility] provider you are looking for under [facility] and page to a number that you can be directly reached. 4. If you still have difficulty reaching the provider, please page the [facility] (Director on Call) for the Hospitalists listed on amion for assistance.

## 2018-11-17 NOTE — Progress Notes (Signed)
Subjective: She feels substantially better.  She is much more awake and alert.  She is off BiPAP.  She does not have any new complaints.  Objective: Vital signs in last 24 hours: Temp:  [97 F (36.1 C)-98.6 F (37 C)] 98 F (36.7 C) (09/06 0819) Pulse Rate:  [67-101] 82 (09/06 0819) Resp:  [12-29] 21 (09/06 0819) BP: (81-177)/(53-148) 107/72 (09/06 0819) SpO2:  [84 %-100 %] 96 % (09/06 0812) FiO2 (%):  [40 %] 40 % (09/06 0349) Weight:  [70.3 kg] 70.3 kg (09/06 0819) Weight change: 1.9 kg    Intake/Output from previous day: 09/05 0701 - 09/06 0700 In: -  Out: 200 [Urine:200]  PHYSICAL EXAM General appearance: alert, cooperative and no distress Resp: clear to auscultation bilaterally Cardio: regular rate and rhythm, S1, S2 normal, no murmur, click, rub or gallop GI: soft, non-tender; bowel sounds normal; no masses,  no organomegaly Extremities: extremities normal, atraumatic, no cyanosis or edema  Lab Results:  Results for orders placed or performed during the hospital encounter of 11/15/18 (from the past 48 hour(s))  SARS Coronavirus 2 [facility] order, Performed in [place] hospital lab)     Status: None   Collection Time: 11/15/18 11:10 PM  Result Value Ref Range   SARS Coronavirus 2 NEGATIVE NEGATIVE    Comment: (NOTE) If result is NEGATIVE SARS-CoV-2 target nucleic acids are NOT DETECTED. The SARS-CoV-2 RNA is generally detectable in upper and lower  respiratory specimens during the acute phase of infection. The lowest  concentration of SARS-CoV-2 viral copies this assay can detect is 250  copies / mL. A negative result does not preclude SARS-CoV-2 infection  and should not be used as the sole basis for treatment or other  patient management decisions.  A negative result may occur with  improper specimen collection / handling, submission of specimen other  than nasopharyngeal swab, presence of viral mutation(s) within the  areas targeted by this assay, and  inadequate number of viral copies  (<250 copies / mL). A negative result must be combined with clinical  observations, patient history, and epidemiological information. If result is POSITIVE SARS-CoV-2 target nucleic acids are DETECTED. The SARS-CoV-2 RNA is generally detectable in upper and lower  respiratory specimens dur ing the acute phase of infection.  Positive  results are indicative of active infection with SARS-CoV-2.  Clinical  correlation with patient history and other diagnostic information is  necessary to determine patient infection status.  Positive results do  not rule out bacterial infection or co-infection with other viruses. If result is PRESUMPTIVE POSTIVE SARS-CoV-2 nucleic acids MAY BE PRESENT.   A presumptive positive result was obtained on the submitted specimen  and confirmed on repeat testing.  While 2019 novel coronavirus  (SARS-CoV-2) nucleic acids may be present in the submitted sample  additional confirmatory testing may be necessary for epidemiological  and / or clinical management purposes  to differentiate between  SARS-CoV-2 and other Sarbecovirus currently known to infect humans.  If clinically indicated additional testing with an alternate test  methodology [id number]) is advised. The SARS-CoV-2 RNA is generally  detectable in upper and lower respiratory sp ecimens during the acute  phase of infection. The expected result is Negative. Fact Sheet for Patients:  StrictlyIdeas.no Fact Sheet for Healthcare Providers: BankingDealers.co.za This test is not yet approved or cleared by the [place] FDA and has been authorized for detection and/or diagnosis of SARS-CoV-2 by FDA under an Emergency Use Authorization (EUA).  This EUA will remain in  effect (meaning this test can be used) for the duration of the COVID-19 declaration under Section 564(b)(1) of the Act, 21 U.S.C. section 360bbb-3(b)(1), unless the  authorization is terminated or revoked sooner. Performed at [facility], [address] [place], [place] [address]   CBC with Differential     Status: Abnormal   Collection Time: 11/15/18 11:30 PM  Result Value Ref Range   WBC 12.8 (H) 4.0 - 10.5 K/uL   RBC 3.84 (L) 3.87 - 5.11 MIL/uL   Hemoglobin 10.6 (L) 12.0 - 15.0 g/dL   HCT 39.1 36.0 - 46.0 %   MCV 101.8 (H) 80.0 - 100.0 fL   MCH 27.6 26.0 - 34.0 pg   MCHC 27.1 (L) 30.0 - 36.0 g/dL   RDW 14.2 11.5 - 15.5 %   Platelets 414 (H) 150 - 400 K/uL   nRBC 0.2 0.0 - 0.2 %   Neutrophils Relative % 75 %   Neutro Abs 9.6 (H) 1.7 - 7.7 K/uL   Lymphocytes Relative 10 %   Lymphs Abs 1.3 0.7 - 4.0 K/uL   Monocytes Relative 13 %   Monocytes Absolute 1.6 (H) 0.1 - 1.0 K/uL   Eosinophils Relative 1 %   Eosinophils Absolute 0.1 0.0 - 0.5 K/uL   Basophils Relative 0 %   Basophils Absolute 0.1 0.0 - 0.1 K/uL   Immature Granulocytes 1 %   Abs Immature Granulocytes 0.13 (H) 0.00 - 0.07 K/uL    Comment: Performed at [facility], [address], [place], [place] [address]  Comprehensive metabolic panel     Status: Abnormal   Collection Time: 11/15/18 11:30 PM  Result Value Ref Range   Sodium 139 135 - 145 mmol/L   Potassium 5.2 (H) 3.5 - 5.1 mmol/L   Chloride 86 (L) 98 - 111 mmol/L   CO2 38 (H) 22 - 32 mmol/L   Glucose, Bld 274 (H) 70 - 99 mg/dL   BUN 20 8 - 23 mg/dL   Creatinine, Ser 1.21 (H) 0.44 - 1.00 mg/dL   Calcium 9.9 8.9 - 10.3 mg/dL   Total Protein 7.1 6.5 - 8.1 g/dL   Albumin 3.5 3.5 - 5.0 g/dL   AST 34 15 - 41 U/L   ALT 31 0 - 44 U/L   Alkaline Phosphatase 90 38 - 126 U/L   Total Bilirubin 0.3 0.3 - 1.2 mg/dL   GFR calc non Af Amer 44 (L) >60 mL/min   GFR calc Af Amer 51 (L) >60 mL/min   Anion gap 15 5 - 15    Comment: Performed at [facility], [address], [place], [place] [address]  Brain natriuretic peptide     Status: Abnormal   Collection Time: 11/15/18 11:30 PM  Result Value Ref Range   B Natriuretic Peptide  1,075.0 (H) 0.0 - 100.0 pg/mL    Comment: Performed at [facility], [address] [place], [place] [address]  Troponin I (High Sensitivity)     Status: Abnormal   Collection Time: 11/15/18 11:30 PM  Result Value Ref Range   Troponin I (High Sensitivity) 19 (H) <18 ng/L    Comment: (NOTE) Elevated high sensitivity troponin I (hsTnI) values and significant  changes across serial measurements may suggest ACS but many other  chronic and acute conditions are known to elevate hsTnI results.  Refer to the "Links" section for chest pain algorithms and additional  guidance. Performed at [facility], [address], [place], [place] [address]   Blood gas, venous  Status: Abnormal   Collection Time: 11/16/18  1:11 AM  Result Value Ref Range   FIO2 50.00    pH, Ven 7.299 7.250 - 7.430   pCO2, Ven 95.1 (HH) 44.0 - 60.0 mmHg    Comment: CRITICAL RESULT CALLED TO, READ BACK BY AND VERIFIED WITH: WALKER,T @ 0132 ON 11/16/18 BY JUW    pO2, Ven 63.1 (H) 32.0 - 45.0 mmHg   Bicarbonate 39.1 (H) 20.0 - 28.0 mmol/L   Acid-Base Excess 18.2 (H) 0.0 - 2.0 mmol/L   O2 Saturation 89.2 %   Patient temperature 37.0     Comment: Performed at [facility], [address], [place], [place] [address]  Troponin I (High Sensitivity)     Status: Abnormal   Collection Time: 11/16/18  1:48 AM  Result Value Ref Range   Troponin I (High Sensitivity) 21 (H) <18 ng/L    Comment: (NOTE) Elevated high sensitivity troponin I (hsTnI) values and significant  changes across serial measurements may suggest ACS but many other  chronic and acute conditions are known to elevate hsTnI results.  Refer to the "Links" section for chest pain algorithms and additional  guidance. Performed at [facility], [address], [place], [place] [address]   Comprehensive metabolic panel     Status: Abnormal   Collection Time: 11/16/18  5:08 AM  Result Value Ref Range   Sodium 137 135 - 145 mmol/L   Potassium 5.1 3.5 - 5.1 mmol/L    Chloride 89 (L) 98 - 111 mmol/L   CO2 35 (H) 22 - 32 mmol/L   Glucose, Bld 193 (H) 70 - 99 mg/dL   BUN 21 8 - 23 mg/dL   Creatinine, Ser 1.05 (H) 0.44 - 1.00 mg/dL   Calcium 9.1 8.9 - 10.3 mg/dL   Total Protein 6.3 (L) 6.5 - 8.1 g/dL   Albumin 3.1 (L) 3.5 - 5.0 g/dL   AST 24 15 - 41 U/L   ALT 26 0 - 44 U/L   Alkaline Phosphatase 74 38 - 126 U/L   Total Bilirubin 0.6 0.3 - 1.2 mg/dL   GFR calc non Af Amer 53 (L) >60 mL/min   GFR calc Af Amer >60 >60 mL/min   Anion gap 13 5 - 15    Comment: Performed at [facility], [address], [place], [place] [address]  CBC     Status: Abnormal   Collection Time: 11/16/18  5:08 AM  Result Value Ref Range   WBC 8.1 4.0 - 10.5 K/uL   RBC 3.56 (L) 3.87 - 5.11 MIL/uL   Hemoglobin 9.9 (L) 12.0 - 15.0 g/dL   HCT 36.1 36.0 - 46.0 %   MCV 101.4 (H) 80.0 - 100.0 fL   MCH 27.8 26.0 - 34.0 pg   MCHC 27.4 (L) 30.0 - 36.0 g/dL   RDW 14.1 11.5 - 15.5 %   Platelets 348 150 - 400 K/uL   nRBC 0.0 0.0 - 0.2 %    Comment: Performed at [facility], [address], [place], [place] [address]  Glucose, capillary     Status: Abnormal   Collection Time: 11/16/18  8:14 AM  Result Value Ref Range   Glucose-Capillary 249 (H) 70 - 99 mg/dL  Blood gas, arterial     Status: Abnormal   Collection Time: 11/16/18  8:30 AM  Result Value Ref Range   FIO2 40.00    pH, Arterial 7.324 (L) 7.350 - 7.450   pCO2 arterial 77.5 (HH) 32.0 - 48.0 mmHg  Comment: CRITICAL RESULT CALLED TO, READ BACK BY AND VERIFIED WITH: SHILTON A. @ [id number] ON [id number] BY HENDERSON L.    pO2, Arterial 67.4 (L) 83.0 - 108.0 mmHg   Bicarbonate 34.9 (H) 20.0 - 28.0 mmol/L   Acid-Base Excess 12.7 (H) 0.0 - 2.0 mmol/L   O2 Saturation 92.3 %   Patient temperature 36.7    Allens test (pass/fail) PASS PASS    Comment: Performed at [facility], [address] [place], [place] [address]  Glucose, capillary     Status: Abnormal   Collection Time: 11/16/18 11:56 AM  Result Value Ref Range    Glucose-Capillary 235 (H) 70 - 99 mg/dL  Basic metabolic panel     Status: Abnormal   Collection Time: 11/16/18  1:12 PM  Result Value Ref Range   Sodium 138 135 - 145 mmol/L   Potassium 4.9 3.5 - 5.1 mmol/L   Chloride 86 (L) 98 - 111 mmol/L   CO2 36 (H) 22 - 32 mmol/L   Glucose, Bld 258 (H) 70 - 99 mg/dL   BUN 25 (H) 8 - 23 mg/dL   Creatinine, Ser 1.12 (H) 0.44 - 1.00 mg/dL   Calcium 9.4 8.9 - 10.3 mg/dL   GFR calc non Af Amer 49 (L) >60 mL/min   GFR calc Af Amer 56 (L) >60 mL/min   Anion gap 16 (H) 5 - 15    Comment: Performed at [facility], [address], [place], [place] [address]  Glucose, capillary     Status: Abnormal   Collection Time: 11/16/18  4:17 PM  Result Value Ref Range   Glucose-Capillary 238 (H) 70 - 99 mg/dL  Glucose, capillary     Status: Abnormal   Collection Time: 11/16/18  8:56 PM  Result Value Ref Range   Glucose-Capillary 242 (H) 70 - 99 mg/dL  Glucose, capillary     Status: Abnormal   Collection Time: 11/17/18  7:32 AM  Result Value Ref Range   Glucose-Capillary 232 (H) 70 - 99 mg/dL    ABGS Recent Labs    11/16/18 0830  PHART 7.324*  PO2ART 67.4*  HCO3 34.9*   CULTURES Recent Results (from the past 240 hour(s))  SARS Coronavirus 2 [facility] order, Performed in [place] hospital lab)     Status: None   Collection Time: 11/15/18 11:10 PM  Result Value Ref Range Status   SARS Coronavirus 2 NEGATIVE NEGATIVE Final    Comment: (NOTE) If result is NEGATIVE SARS-CoV-2 target nucleic acids are NOT DETECTED. The SARS-CoV-2 RNA is generally detectable in upper and lower  respiratory specimens during the acute phase of infection. The lowest  concentration of SARS-CoV-2 viral copies this assay can detect is 250  copies / mL. A negative result does not preclude SARS-CoV-2 infection  and should not be used as the sole basis for treatment or other  patient management decisions.  A negative result may occur with  improper specimen collection /  handling, submission of specimen other  than nasopharyngeal swab, presence of viral mutation(s) within the  areas targeted by this assay, and inadequate number of viral copies  (<250 copies / mL). A negative result must be combined with clinical  observations, patient history, and epidemiological information. If result is POSITIVE SARS-CoV-2 target nucleic acids are DETECTED. The SARS-CoV-2 RNA is generally detectable in upper and lower  respiratory specimens dur ing the acute phase of infection.  Positive  results are indicative of active infection with SARS-CoV-2.  Clinical  correlation  with patient history and other diagnostic information is  necessary to determine patient infection status.  Positive results do  not rule out bacterial infection or co-infection with other viruses. If result is PRESUMPTIVE POSTIVE SARS-CoV-2 nucleic acids MAY BE PRESENT.   A presumptive positive result was obtained on the submitted specimen  and confirmed on repeat testing.  While 2019 novel coronavirus  (SARS-CoV-2) nucleic acids may be present in the submitted sample  additional confirmatory testing may be necessary for epidemiological  and / or clinical management purposes  to differentiate between  SARS-CoV-2 and other Sarbecovirus currently known to infect humans.  If clinically indicated additional testing with an alternate test  methodology [phone]) is advised. The SARS-CoV-2 RNA is generally  detectable in upper and lower respiratory sp ecimens during the acute  phase of infection. The expected result is Negative. Fact Sheet for Patients:  StrictlyIdeas.no Fact Sheet for Healthcare Providers: BankingDealers.co.za This test is not yet approved or cleared by the [place] FDA and has been authorized for detection and/or diagnosis of SARS-CoV-2 by FDA under an Emergency Use Authorization (EUA).  This EUA will remain in effect (meaning this  test can be used) for the duration of the COVID-19 declaration under Section 564(b)(1) of the Act, 21 U.S.C. section 360bbb-3(b)(1), unless the authorization is terminated or revoked sooner. Performed at [facility], [address], [place], [place] [address]    Studies/Results: Dg Chest Portable 1 View  Result Date: 11/16/2018 CLINICAL DATA:  Respiratory distress EXAM: PORTABLE CHEST 1 VIEW COMPARISON:  November 06, 2018 FINDINGS: There is mild cardiomegaly. Streaky airspace opacity seen at the right lung base. The left lung is clear. No acute osseous abnormality. IMPRESSION: Streaky airspace opacity at the right lung base which could be due to atelectasis and/or early infectious etiology. Electronically Signed   By: Prudencio Pair M.D.   On: 11/16/2018 00:07    Medications:  Prior to Admission:  Medications Prior to Admission  Medication Sig Dispense Refill Last Dose  . acetaminophen (TYLENOL) 500 MG tablet Take 500 mg by mouth every 8 (eight) hours as needed for mild pain or headache.    11/15/2018 at Unknown time  . albuterol (PROVENTIL) (2.5 MG/3ML) 0.083% nebulizer solution Take 2.5 mg by nebulization every 6 (six) hours as needed for wheezing or shortness of breath.   11/15/2018 at Unknown time  . furosemide (LASIX) 40 MG tablet Take 1.5 tablets (60 mg total) by mouth daily. 30 tablet  Past Week at Unknown time  . metFORMIN (GLUCOPHAGE-XR) 500 MG 24 hr tablet Take 500 mg by mouth daily with breakfast.   1 11/15/2018 at Unknown time  . metoprolol tartrate (LOPRESSOR) 50 MG tablet Take 50 mg by mouth 2 (two) times daily.   11/15/2018 at Unknown time  . polyethylene glycol (MIRALAX / GLYCOLAX) 17 g packet Take 17 g by mouth daily as needed for mild constipation. 28 each 0 unknown  . potassium chloride SA (K-DUR) 20 MEQ tablet Take 20 mEq by mouth 2 (two) times daily.   11/15/2018 at Unknown time  . PROAIR HFA 108 (90 BASE) MCG/ACT inhaler Inhale 2 puffs into the lungs every 6 (six) hours as needed for  wheezing or shortness of breath.    unknown  . QUEtiapine (SEROQUEL) 25 MG tablet Take 1 tablet (25 mg total) by mouth daily at 6 PM. 30 tablet 3 11/15/2018 at Unknown time  . rivaroxaban (XARELTO) 20 MG TABS tablet Take 1 tablet (20 mg total) by mouth daily  with supper. Stop Xarelto until Monday (08/26/18) in anticipation for kyphoplasty 21 tablet 0 11/15/2018 at 1800  . TRELEGY ELLIPTA 100-62.5-25 MCG/INH AEPB Take 1 puff by mouth daily.  0 11/15/2018 at Unknown time   Scheduled: . albuterol  2.5 mg Nebulization Q6H  . Chlorhexidine Gluconate Cloth  6 each Topical Daily  . docusate sodium  100 mg Oral Daily  . fluticasone furoate-vilanterol  1 puff Inhalation Daily  . furosemide  40 mg Intravenous Daily  . guaiFENesin  600 mg Oral BID  . insulin aspart  0-20 Units Subcutaneous TID WC  . insulin aspart  0-5 Units Subcutaneous QHS  . insulin aspart  6 Units Subcutaneous TID WC  . insulin glargine  15 Units Subcutaneous Daily  . methylPREDNISolone (SOLU-MEDROL) injection  60 mg Intravenous Q8H  . metoprolol tartrate  25 mg Oral BID  . potassium chloride SA  20 mEq Oral BID  . QUEtiapine  25 mg Oral q1800  . rivaroxaban  15 mg Oral Q breakfast  . sodium chloride flush  3 mL Intravenous Q12H  . umeclidinium bromide  1 puff Inhalation Daily   Continuous: . sodium chloride    . azithromycin 500 mg (11/17/18 0215)   [id number] chloride, acetaminophen **OR** acetaminophen, albuterol, HYDROcodone-acetaminophen, polyethylene glycol, sodium chloride flush  Assesment: She was admitted with acute on chronic hypoxic and hypercapnic respiratory failure.  She has COPD exacerbation.  She has had substantial swelling in the past but her echocardiogram done yesterday shows normal systolic function and normal diastolic parameters.  Right ventricle was slightly enlarged so she may have some element of cor pulmonale but it had normal systolic function.  She had required BiPAP but now she is on nasal cannula.  She  is awake and alert.  She has had cardiac arrhythmias with atrial flutter/fibrillation and that seems to be stable now.  She has diabetes on sliding scale Principal Problem:   Acute respiratory failure with hypoxia and hypercapnia (HCC) Active Problems:   COPD with acute exacerbation (HCC)   Atrial flutter (HCC)   Diabetes mellitus ([place])    Plan: Continue current treatments.  She has improved substantially since yesterday    LOS: 1 day   Alonza Bogus 11/17/2018, 8:37 AM

## 2018-11-17 NOTE — TOC Initial Note (Addendum)
Transition of Care [facility]) - Initial/Assessment Note    Patient Details  Name: Eileen [name] MRN: [id number] Date of Birth: 04-12-1945  Transition of Care [facility]) CM/SW Contact:    Ihor Gully, LCSW Phone Number: 11/17/2018, 11:34 AM  Clinical Narrative:                 Patient is well known to [facility] due to multiple admissions. Patient from home with daughter, Eileen [name]. Patient admitted for Acute respiratory failure with hypoxia and hypercapnia. Patient discharged on 11/11/2018, with [facility] (RN, Swkr, PT). Patient is on home oxygen 3.5 liters at baseline.  Patient ambulates with a walker. Dtr., Eileen [name], states that patient's first visit with Alvis Lemmings was on Thursday 11/14/2018 and that PT was supposed to come on 11/16/2018 but patient became ill with vertigo. Patient has no issues with maintaining physician appointments nor obtaining medications.  TOC will follow through discharge and address needs as they arise.   Expected Discharge Plan: [place] Barriers to Discharge: No Barriers Identified   Patient Goals and CMS Choice Patient states their goals for this hospitalization and ongoing recovery are:: return home      Expected Discharge Plan and Services Expected Discharge Plan: [place] Acute Care Choice: Resumption of Svcs/PTA Provider Living arrangements for the past 2 months: Single Family Home Expected Discharge Date: 11/19/18                                    Prior Living Arrangements/Services Living arrangements for the past 2 months: Single Family Home Lives with:: Adult Children Patient language and need for interpreter reviewed:: Yes Do you feel safe going back to the place where you live?: Yes      Need for Family Participation in Patient Care: Yes (Comment) Care giver support system in place?: Yes (comment) Current home services: DME, Home RN, Other (comment)(HH Swkr) Criminal Activity/Legal Involvement Pertinent  to Current Situation/Hospitalization: No - Comment as needed  Activities of Daily Living Home Assistive Devices/Equipment: Oxygen, Nebulizer ADL Screening (condition at time of admission) Patient's cognitive ability adequate to safely complete daily activities?: No Is the patient deaf or have difficulty hearing?: No Does the patient have difficulty seeing, even when wearing glasses/contacts?: No Does the patient have difficulty concentrating, remembering, or making decisions?: Yes Patient able to express need for assistance with ADLs?: Yes Does the patient have difficulty dressing or bathing?: Yes Independently performs ADLs?: No Communication: Independent Dressing (OT): Needs assistance Is this a change from baseline?: Pre-admission baseline Grooming: Needs assistance Is this a change from baseline?: Pre-admission baseline Feeding: Independent Bathing: Needs assistance Is this a change from baseline?: Pre-admission baseline Toileting: Needs assistance Is this a change from baseline?: Pre-admission baseline In/Out Bed: Needs assistance Is this a change from baseline?: Pre-admission baseline Walks in Home: Needs assistance Is this a change from baseline?: Pre-admission baseline Does the patient have difficulty walking or climbing stairs?: No Weakness of Legs: None Weakness of Arms/Hands: None  Permission Sought/Granted                  Emotional Assessment Appearance:: Appears stated age   Affect (typically observed): Unable to Assess   Alcohol / Substance Use: Not Applicable Psych Involvement: No (comment)  Admission diagnosis:  Respiratory distress [R06.03] COPD exacerbation (HCC) [J44.1] Acute respiratory failure with hypoxia and hypercapnia (HCC) [J96.01, J96.02]  Patient Active Problem List   Diagnosis Date Noted  . Acute respiratory failure with hypoxia and hypercapnia ([place]) 11/16/2018  . Chronic obstructive bronchitis ([place])   . Dementia with behavioral  disturbance ([place])   . Goals of care, counseling/discussion   . Advanced care planning/counseling discussion   . Palliative care by specialist   . Bronchiectasis with acute exacerbation ([place])   . COPD exacerbation ([place]) 10/18/2018  . Anemia 10/18/2018  . Osteoporosis 08/30/2018  . Closed compression fracture of body of L1 vertebra ([place])   . Constipation   . Acute and chronic respiratory failure (acute-on-chronic) ([place]) 08/10/2018  . Left lower lobe pneumonia ([place]) 08/10/2018  . Leukocytosis 08/10/2018  . Chronic respiratory failure with hypoxia ([place]) 06/28/2018  . Acute upper GI bleeding 01/20/2018  . Lower GI bleed 01/20/2018  . Acute respiratory failure with hypoxia ([place]) 09/17/2017  . Diabetes mellitus ([place]) 08/31/2017  . Acute hypoxemic respiratory failure ([place]) 08/31/2017  . Hemorrhoids 10/30/2016  . Atrial fibrillation with RVR ([place]) 09/24/2016  . Atrial flutter ([place]) 09/23/2016  . Obstructive chronic bronchitis with exacerbation ([place]) 05/23/2016  . Vitamin D deficiency 05/23/2016  . Acute on chronic respiratory failure ([place]) 03/24/2015  . Metabolic encephalopathy [id number]  . HCAP (healthcare-associated pneumonia) 03/24/2015  . Anticoagulation management encounter   . New onset atrial flutter ([place]) 03/21/2015  . Obesity 03/21/2015  . Atrial flutter with rapid ventricular response ([place]) 03/21/2015  . COPD (chronic obstructive pulmonary disease) ([place]) 12/20/2014  . COPD with acute exacerbation ([place]) 12/20/2014  . 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 Breast cancer ([place]) 10/11/2009   PCP:  Lavella Lemons, PA Pharmacy:   [facility], [address] [place] [address] Phone: [phone] Fax: [phone]     Social Determinants of Health (SDOH) Interventions    Readmission Risk Interventions Readmission Risk Prevention Plan 11/11/2018 10/25/2018 10/24/2018   Transportation Screening Complete - Complete  PCP or Specialist Appt within 5-7 Days - - -  Home Care Screening - - -  Medication Review (RN CM) - - -  Medication Review (RN Care Manager) Complete - Complete  PCP or Specialist appointment within 3-5 days of discharge Complete Complete -  [place] or Home Care Consult Complete Complete -  SW Recovery Care/Counseling Consult Complete - Complete  Palliative Care Screening Complete Not Complete -  Comments - Palliative not available -  [place] Not Applicable Not Applicable -  Some recent data might be hidden

## 2018-11-18 DIAGNOSIS — T17908A Unspecified foreign body in respiratory tract, part unspecified causing other injury, initial encounter: Secondary | ICD-10-CM

## 2018-11-18 LAB — CBC
HCT: 34.9 % — ABNORMAL LOW (ref 36.0–46.0)
Hemoglobin: 9.7 g/dL — ABNORMAL LOW (ref 12.0–15.0)
MCH: 27.3 pg (ref 26.0–34.0)
MCHC: 27.8 g/dL — ABNORMAL LOW (ref 30.0–36.0)
MCV: 98.3 fL (ref 80.0–100.0)
Platelets: 377 10*3/uL (ref 150–400)
RBC: 3.55 MIL/uL — ABNORMAL LOW (ref 3.87–5.11)
RDW: 13.9 % (ref 11.5–15.5)
WBC: 11.3 10*3/uL — ABNORMAL HIGH (ref 4.0–10.5)
nRBC: 0 % (ref 0.0–0.2)

## 2018-11-18 LAB — GLUCOSE, CAPILLARY
Glucose-Capillary: 202 mg/dL — ABNORMAL HIGH (ref 70–99)
Glucose-Capillary: 194 mg/dL — ABNORMAL HIGH (ref 70–99)
Glucose-Capillary: 232 mg/dL — ABNORMAL HIGH (ref 70–99)
Glucose-Capillary: 256 mg/dL — ABNORMAL HIGH (ref 70–99)
Glucose-Capillary: 278 mg/dL — ABNORMAL HIGH (ref 70–99)

## 2018-11-18 MED ORDER — METHYLPREDNISOLONE SODIUM SUCC 40 MG IJ SOLR
40.0000 mg | Freq: Three times a day (TID) | INTRAMUSCULAR | Status: DC
  Administered 2018-11-18 – 2018-11-19 (×2): 40 mg via INTRAVENOUS
  Filled 2018-11-18 (×2): qty 1

## 2018-11-18 MED ORDER — INSULIN GLARGINE 100 UNIT/ML ~~LOC~~ SOLN: 24.0000 [IU] | Freq: Every day | SUBCUTANEOUS | Status: DC

## 2018-11-18 MED ORDER — SODIUM CHLORIDE 0.9 % IV SOLN
3.0000 g | Freq: Four times a day (QID) | INTRAVENOUS | Status: DC
  Administered 2018-11-18 – 2018-11-22 (×17): 3 g via INTRAVENOUS
  Filled 2018-11-18: qty 8
  Filled 2018-11-18 (×3): qty 3
  Filled 2018-11-18 (×2): qty 8
  Filled 2018-11-18: qty 3
  Filled 2018-11-18 (×5): qty 8
  Filled 2018-11-18: qty 3
  Filled 2018-11-18 (×2): qty 8
  Filled 2018-11-18: qty 3
  Filled 2018-11-18 (×5): qty 8
  Filled 2018-11-18: qty 3
  Filled 2018-11-18 (×3): qty 8
  Filled 2018-11-18: qty 3
  Filled 2018-11-18 (×2): qty 8
  Filled 2018-11-18: qty 3
  Filled 2018-11-18: qty 8

## 2018-11-18 MED ORDER — INSULIN ASPART 100 UNIT/ML ~~LOC~~ SOLN
10.0000 [IU] | Freq: Three times a day (TID) | SUBCUTANEOUS | Status: DC
  Administered 2018-11-19 (×2): 10 [IU] via SUBCUTANEOUS

## 2018-11-18 NOTE — Progress Notes (Signed)
Subjective: She says she feels okay.  It appears that she aspirated this morning.  She was coughing significantly when she was eating her breakfast.  She de saturated but is doing okay now on high flow nasal cannula at 6 L  Objective: Vital signs in last 24 hours: Temp:  [97.8 F (36.6 C)-99.3 F (37.4 C)] 98.2 F (36.8 C) (09/07 0730) Pulse Rate:  [61-99] 90 (09/07 0800) Resp:  [17-33] 28 (09/07 0800) BP: (91-134)/(59-90) 113/65 (09/07 0800) SpO2:  [87 %-100 %] 98 % (09/07 0800) Weight:  [69.3 kg] 69.3 kg (09/07 0500) Weight change: 0 kg    Intake/Output from previous day: 09/06 0701 - 09/07 0700 In: 3038.9 [IV Piggyback:3038.9] Out: 200 [Urine:200]  PHYSICAL EXAM General appearance: alert, cooperative and mild distress Resp: rhonchi bilaterally Cardio: regular rate and rhythm, S1, S2 normal, no murmur, click, rub or gallop GI: soft, non-tender; bowel sounds normal; no masses,  no organomegaly Extremities: extremities normal, atraumatic, no cyanosis or edema  Lab Results:  Results for orders placed or performed during the hospital encounter of 11/15/18 (from the past 48 hour(s))  Glucose, capillary     Status: Abnormal   Collection Time: 11/16/18 11:56 AM  Result Value Ref Range   Glucose-Capillary 235 (H) 70 - 99 mg/dL  Basic metabolic panel     Status: Abnormal   Collection Time: 11/16/18  1:12 PM  Result Value Ref Range   Sodium 138 135 - 145 mmol/L   Potassium 4.9 3.5 - 5.1 mmol/L   Chloride 86 (L) 98 - 111 mmol/L   CO2 36 (H) 22 - 32 mmol/L   Glucose, Bld 258 (H) 70 - 99 mg/dL   BUN 25 (H) 8 - 23 mg/dL   Creatinine, Ser 1.12 (H) 0.44 - 1.00 mg/dL   Calcium 9.4 8.9 - 10.3 mg/dL   GFR calc non Af Amer 49 (L) >60 mL/min   GFR calc Af Amer 56 (L) >60 mL/min   Anion gap 16 (H) 5 - 15    Comment: Performed at [facility], [address] [place], [place] [address]  Glucose, capillary     Status: Abnormal   Collection Time: 11/16/18  4:17 PM  Result Value Ref Range    Glucose-Capillary 238 (H) 70 - 99 mg/dL  Glucose, capillary     Status: Abnormal   Collection Time: 11/16/18  8:56 PM  Result Value Ref Range   Glucose-Capillary 242 (H) 70 - 99 mg/dL  Glucose, capillary     Status: Abnormal   Collection Time: 11/17/18  7:32 AM  Result Value Ref Range   Glucose-Capillary 232 (H) 70 - 99 mg/dL  Glucose, capillary     Status: Abnormal   Collection Time: 11/17/18 11:52 AM  Result Value Ref Range   Glucose-Capillary 120 (H) 70 - 99 mg/dL  Glucose, capillary     Status: Abnormal   Collection Time: 11/17/18  5:19 PM  Result Value Ref Range   Glucose-Capillary 202 (H) 70 - 99 mg/dL  Glucose, capillary     Status: Abnormal   Collection Time: 11/17/18  9:19 PM  Result Value Ref Range   Glucose-Capillary 190 (H) 70 - 99 mg/dL  Glucose, capillary     Status: Abnormal   Collection Time: 11/18/18  3:10 AM  Result Value Ref Range   Glucose-Capillary 202 (H) 70 - 99 mg/dL  CBC     Status: Abnormal   Collection Time: 11/18/18  4:34 AM  Result Value Ref Range   WBC  11.3 (H) 4.0 - 10.5 K/uL   RBC 3.55 (L) 3.87 - 5.11 MIL/uL   Hemoglobin 9.7 (L) 12.0 - 15.0 g/dL   HCT 34.9 (L) 36.0 - 46.0 %   MCV 98.3 80.0 - 100.0 fL   MCH 27.3 26.0 - 34.0 pg   MCHC 27.8 (L) 30.0 - 36.0 g/dL   RDW 13.9 11.5 - 15.5 %   Platelets 377 150 - 400 K/uL   nRBC 0.0 0.0 - 0.2 %    Comment: Performed at [facility], [address], [place], [place] [address]  Glucose, capillary     Status: Abnormal   Collection Time: 11/18/18  7:29 AM  Result Value Ref Range   Glucose-Capillary 194 (H) 70 - 99 mg/dL    ABGS Recent Labs    11/16/18 0830  PHART 7.324*  PO2ART 67.4*  HCO3 34.9*   CULTURES Recent Results (from the past 240 hour(s))  SARS Coronavirus 2 [facility] order, Performed in [place] hospital lab)     Status: None   Collection Time: 11/15/18 11:10 PM  Result Value Ref Range Status   SARS Coronavirus 2 NEGATIVE NEGATIVE Final    Comment: (NOTE) If result is  NEGATIVE SARS-CoV-2 target nucleic acids are NOT DETECTED. The SARS-CoV-2 RNA is generally detectable in upper and lower  respiratory specimens during the acute phase of infection. The lowest  concentration of SARS-CoV-2 viral copies this assay can detect is 250  copies / mL. A negative result does not preclude SARS-CoV-2 infection  and should not be used as the sole basis for treatment or other  patient management decisions.  A negative result may occur with  improper specimen collection / handling, submission of specimen other  than nasopharyngeal swab, presence of viral mutation(s) within the  areas targeted by this assay, and inadequate number of viral copies  (<250 copies / mL). A negative result must be combined with clinical  observations, patient history, and epidemiological information. If result is POSITIVE SARS-CoV-2 target nucleic acids are DETECTED. The SARS-CoV-2 RNA is generally detectable in upper and lower  respiratory specimens dur ing the acute phase of infection.  Positive  results are indicative of active infection with SARS-CoV-2.  Clinical  correlation with patient history and other diagnostic information is  necessary to determine patient infection status.  Positive results do  not rule out bacterial infection or co-infection with other viruses. If result is PRESUMPTIVE POSTIVE SARS-CoV-2 nucleic acids MAY BE PRESENT.   A presumptive positive result was obtained on the submitted specimen  and confirmed on repeat testing.  While 2019 novel coronavirus  (SARS-CoV-2) nucleic acids may be present in the submitted sample  additional confirmatory testing may be necessary for epidemiological  and / or clinical management purposes  to differentiate between  SARS-CoV-2 and other Sarbecovirus currently known to infect humans.  If clinically indicated additional testing with an alternate test  methodology [phone]) is advised. The SARS-CoV-2 RNA is generally  detectable  in upper and lower respiratory sp ecimens during the acute  phase of infection. The expected result is Negative. Fact Sheet for Patients:  StrictlyIdeas.no Fact Sheet for Healthcare Providers: BankingDealers.co.za This test is not yet approved or cleared by the [place] FDA and has been authorized for detection and/or diagnosis of SARS-CoV-2 by FDA under an Emergency Use Authorization (EUA).  This EUA will remain in effect (meaning this test can be used) for the duration of the COVID-19 declaration under Section 564(b)(1) of the Act, 21 U.S.C. section  360bbb-3(b)(1), unless the authorization is terminated or revoked sooner. Performed at [facility], [address], [place], [place] [address]    Studies/Results: Dg Chest Port 1 View  Result Date: 11/17/2018 CLINICAL DATA:  Acute respiratory failure.  History of COPD. EXAM: PORTABLE CHEST 1 VIEW COMPARISON:  Single-view of the chest 11/15/2018 and 11/06/2018. CT chest 09/27/2018. FINDINGS: The lungs are emphysematous. Right basilar opacity seen on the most recent examination has resolved. Minimal atelectasis left lung base noted. Heart size is enlarged. No pneumothorax or pleural fluid. IMPRESSION: Resolved right basilar airspace opacity. No change in left basilar subsegmental atelectasis. Emphysema. Atherosclerosis. Electronically Signed   By: Inge Rise M.D.   On: 11/17/2018 09:06    Medications:  Prior to Admission:  Medications Prior to Admission  Medication Sig Dispense Refill Last Dose  . acetaminophen (TYLENOL) 500 MG tablet Take 500 mg by mouth every 8 (eight) hours as needed for mild pain or headache.    11/15/2018 at Unknown time  . albuterol (PROVENTIL) (2.5 MG/3ML) 0.083% nebulizer solution Take 2.5 mg by nebulization every 6 (six) hours as needed for wheezing or shortness of breath.   11/15/2018 at Unknown time  . furosemide (LASIX) 40 MG tablet Take 1.5 tablets (60 mg total)  by mouth daily. 30 tablet  Past Week at Unknown time  . metFORMIN (GLUCOPHAGE-XR) 500 MG 24 hr tablet Take 500 mg by mouth daily with breakfast.   1 11/15/2018 at Unknown time  . metoprolol tartrate (LOPRESSOR) 50 MG tablet Take 50 mg by mouth 2 (two) times daily.   11/15/2018 at Unknown time  . polyethylene glycol (MIRALAX / GLYCOLAX) 17 g packet Take 17 g by mouth daily as needed for mild constipation. 28 each 0 unknown  . potassium chloride SA (K-DUR) 20 MEQ tablet Take 20 mEq by mouth 2 (two) times daily.   11/15/2018 at Unknown time  . PROAIR HFA 108 (90 BASE) MCG/ACT inhaler Inhale 2 puffs into the lungs every 6 (six) hours as needed for wheezing or shortness of breath.    unknown  . QUEtiapine (SEROQUEL) 25 MG tablet Take 1 tablet (25 mg total) by mouth daily at 6 PM. 30 tablet 3 11/15/2018 at Unknown time  . rivaroxaban (XARELTO) 20 MG TABS tablet Take 1 tablet (20 mg total) by mouth daily with supper. Stop Xarelto until Monday (08/26/18) in anticipation for kyphoplasty 21 tablet 0 11/15/2018 at 1800  . TRELEGY ELLIPTA 100-62.5-25 MCG/INH AEPB Take 1 puff by mouth daily.  0 11/15/2018 at Unknown time   Scheduled: . albuterol  2.5 mg Nebulization Q6H  . Chlorhexidine Gluconate Cloth  6 each Topical Daily  . docusate sodium  100 mg Oral Daily  . fluticasone furoate-vilanterol  1 puff Inhalation Daily  . furosemide  40 mg Intravenous Daily  . guaiFENesin  600 mg Oral BID  . insulin aspart  0-20 Units Subcutaneous TID WC  . insulin aspart  0-5 Units Subcutaneous QHS  . insulin aspart  6 Units Subcutaneous TID WC  . insulin glargine  15 Units Subcutaneous Daily  . mouth rinse  15 mL Mouth Rinse BID  . methylPREDNISolone (SOLU-MEDROL) injection  60 mg Intravenous Q8H  . metoprolol tartrate  25 mg Oral BID  . potassium chloride SA  20 mEq Oral BID  . QUEtiapine  25 mg Oral q1800  . rivaroxaban  15 mg Oral Q breakfast  . sodium chloride flush  3 mL Intravenous Q12H  . umeclidinium bromide  1 puff  Inhalation  Daily   Continuous: . sodium chloride    . ampicillin-sulbactam (UNASYN) IV     [id number] chloride, acetaminophen **OR** acetaminophen, albuterol, HYDROcodone-acetaminophen, polyethylene glycol, sodium chloride flush  Assesment: She was admitted with acute on chronic hypoxic and hypercapnic respiratory failure requiring BiPAP.  She is off BiPAP now and on high flow nasal cannula.  She is much more awake than when I saw her for initial consultation.  There has been clinical concern that she is been aspirating that appears to have happened this morning.  She has COPD at baseline and has exacerbation which is being treated with steroids  She has had trouble with atrial flutter and she is chronically anticoagulated  She has diabetes which is pretty stable Principal Problem:   Acute respiratory failure with hypoxia and hypercapnia ([place]) Active Problems:   COPD with acute exacerbation (HCC)   Atrial flutter (HCC)   Diabetes mellitus ([place])    Plan: Discussed with Dr. Wynetta Emery.  He plans to get speech involved again.  Agree with plans to switch to Unasyn.  Check chest x-ray in the morning.  Doubt she would have time to really develop an infiltrate now    LOS: 2 days   Alonza Bogus 11/18/2018, 8:37 AM

## 2018-11-18 NOTE — Progress Notes (Signed)
PROGRESS NOTE  Eileen [name]  [id number]  DOB: 1945-08-13  DOA: 11/15/2018 PCP: Lavella Lemons, PA   Brief Admission Hx: 73 y/o female with pulmonary hypertension, COPD, chronic respiratory failure on 3.5 L oxygen at baseline presented with severe hypoxia and acute on chronic respiratory failure with hypoxia and severe COPD exacerbation requiring BiPAP therapy and stepdown ICU placement.  MDM/Assessment & Plan:   1. Acute on chronic respiratory failure with hypoxia-secondary to severe acute COPD exacerbation-patient has been weaned off BiPAP but available as needed.  Aspiration precautions.  Continue current treatments appreciate pulmonary consultation. 2. Aspiration - Pt aspirated with breakfast, started aspiration precautions, dysphagia diet and IV unasyn ordered. CXR in AM. Discussed with Dr. Luan Pulling.  3. COPD exacerbation-continue IV steroids scheduled nebs and antibiotics. 4. Type 2 diabetes mellitus, poorly controlled as evidenced by hemoglobin A1c of 8.5%- steroid-induced hyperglycemia, continue to titrate insulin doses to for better glycemic control.  Monitor CBG 5 times per day and continue supplemental sliding scale coverage. 5. Atrial flutter-continue Xarelto daily and continue Lopressor 50 mg twice daily 6. Mild acute diastolic CHF exacerbation-patient is diuresing well with IV Lasix and we have reduced the dose today continue to follow electrolytes closely.  Continue to follow daily weights. 7. Status post L1 compression fracture status post vertebroplasty - follow up with PCP.  8. Mild troponin elevation - secondary to demand ischemia.   9. History of dementia - resumed home seroquel.   DVT prophylaxis: xarelto  Code Status: full  Family Communication: patient updated bedside  Disposition Plan: continue stepdown ICU care   Consultants:  Pulmonology (Dr. Luan Pulling)  Procedures:     Antimicrobials:   unasyn 9/7 >>  Subjective: Pt started aspirating with  breakfast this morning, now coughing, became acutely hypoxic but improved on HFNC.    Objective: Vitals:   11/18/18 0730 11/18/18 0800 11/18/18 0826 11/18/18 0900  BP:  113/65  (!) 102/58  Pulse: 74 90    Resp: (!) 31 (!) 28  (!) 37  Temp: 98.2 F (36.8 C)     TempSrc: Oral     SpO2: 99% 98% 98% 94%  Weight:      Height:        Intake/Output Summary (Last 24 hours) at 11/18/2018 1021 Last data filed at 11/18/2018 0600 Gross per 24 hour  Intake 3038.89 ml  Output 200 ml  Net 2838.89 ml   Filed Weights   11/17/18 0500 11/17/18 0819 11/18/18 0500  Weight: 70.3 kg 70.3 kg 69.3 kg   REVIEW OF SYSTEMS  As per history otherwise all reviewed and reported negative  Exam:  General exam: awake, alert, NAD cooperative.  Respiratory system: poor air movement. No increased work of breathing. Cardiovascular system: S1 & S2 heard. No JVD, murmurs, gallops, clicks or pedal edema. Gastrointestinal system: Abdomen is nondistended, soft and nontender. Normal bowel sounds heard. Central nervous system: Alert and oriented. No focal neurological deficits. Extremities: 1+ pretibial edema BLEs.  Data Reviewed: Basic Metabolic Panel: Recent Labs  Lab 11/15/18 2330 11/16/18 0508 11/16/18 1312  NA 139 137 138  K 5.2* 5.1 4.9  CL 86* 89* 86*  CO2 38* 35* 36*  GLUCOSE 274* 193* 258*  BUN 20 21 25*  CREATININE 1.21* 1.05* 1.12*  CALCIUM 9.9 9.1 9.4   Liver Function Tests: Recent Labs  Lab 11/15/18 2330 11/16/18 0508  AST 34 24  ALT 31 26  ALKPHOS 90 74  BILITOT 0.3 0.6  PROT 7.1 6.3*  ALBUMIN 3.5  3.1*   No results for input(s): LIPASE, AMYLASE in the last 168 hours. No results for input(s): AMMONIA in the last 168 hours. CBC: Recent Labs  Lab 11/15/18 2330 11/16/18 0508 11/18/18 0434  WBC 12.8* 8.1 11.3*  NEUTROABS 9.6*  --   --   HGB 10.6* 9.9* 9.7*  HCT 39.1 36.1 34.9*  MCV 101.8* 101.4* 98.3  PLT 414* 348 377   Cardiac Enzymes: No results for input(s): CKTOTAL,  CKMB, CKMBINDEX, TROPONINI in the last 168 hours. CBG (last 3)  Recent Labs    11/17/18 2119 11/18/18 0310 11/18/18 0729  GLUCAP 190* 202* 194*   Recent Results (from the past 240 hour(s))  SARS Coronavirus 2 [facility] order, Performed in [place] hospital lab)     Status: None   Collection Time: 11/15/18 11:10 PM  Result Value Ref Range Status   SARS Coronavirus 2 NEGATIVE NEGATIVE Final    Comment: (NOTE) If result is NEGATIVE SARS-CoV-2 target nucleic acids are NOT DETECTED. The SARS-CoV-2 RNA is generally detectable in upper and lower  respiratory specimens during the acute phase of infection. The lowest  concentration of SARS-CoV-2 viral copies this assay can detect is 250  copies / mL. A negative result does not preclude SARS-CoV-2 infection  and should not be used as the sole basis for treatment or other  patient management decisions.  A negative result may occur with  improper specimen collection / handling, submission of specimen other  than nasopharyngeal swab, presence of viral mutation(s) within the  areas targeted by this assay, and inadequate number of viral copies  (<250 copies / mL). A negative result must be combined with clinical  observations, patient history, and epidemiological information. If result is POSITIVE SARS-CoV-2 target nucleic acids are DETECTED. The SARS-CoV-2 RNA is generally detectable in upper and lower  respiratory specimens dur ing the acute phase of infection.  Positive  results are indicative of active infection with SARS-CoV-2.  Clinical  correlation with patient history and other diagnostic information is  necessary to determine patient infection status.  Positive results do  not rule out bacterial infection or co-infection with other viruses. If result is PRESUMPTIVE POSTIVE SARS-CoV-2 nucleic acids MAY BE PRESENT.   A presumptive positive result was obtained on the submitted specimen  and confirmed on repeat testing.  While 2019  novel coronavirus  (SARS-CoV-2) nucleic acids may be present in the submitted sample  additional confirmatory testing may be necessary for epidemiological  and / or clinical management purposes  to differentiate between  SARS-CoV-2 and other Sarbecovirus currently known to infect humans.  If clinically indicated additional testing with an alternate test  methodology [phone]) is advised. The SARS-CoV-2 RNA is generally  detectable in upper and lower respiratory sp ecimens during the acute  phase of infection. The expected result is Negative. Fact Sheet for Patients:  StrictlyIdeas.no Fact Sheet for Healthcare Providers: BankingDealers.co.za This test is not yet approved or cleared by the [place] FDA and has been authorized for detection and/or diagnosis of SARS-CoV-2 by FDA under an Emergency Use Authorization (EUA).  This EUA will remain in effect (meaning this test can be used) for the duration of the COVID-19 declaration under Section 564(b)(1) of the Act, 21 U.S.C. section 360bbb-3(b)(1), unless the authorization is terminated or revoked sooner. Performed at [facility], [address] [place], [place] [address]      Studies: Dg Chest [facility] 1 View  Result Date: 11/17/2018 CLINICAL DATA:  Acute respiratory failure.  History  of COPD. EXAM: PORTABLE CHEST 1 VIEW COMPARISON:  Single-view of the chest 11/15/2018 and 11/06/2018. CT chest 09/27/2018. FINDINGS: The lungs are emphysematous. Right basilar opacity seen on the most recent examination has resolved. Minimal atelectasis left lung base noted. Heart size is enlarged. No pneumothorax or pleural fluid. IMPRESSION: Resolved right basilar airspace opacity. No change in left basilar subsegmental atelectasis. Emphysema. Atherosclerosis. Electronically Signed   By: Inge Rise M.D.   On: 11/17/2018 09:06     Scheduled Meds: . albuterol  2.5 mg Nebulization Q6H  . Chlorhexidine  Gluconate Cloth  6 each Topical Daily  . docusate sodium  100 mg Oral Daily  . fluticasone furoate-vilanterol  1 puff Inhalation Daily  . furosemide  40 mg Intravenous Daily  . guaiFENesin  600 mg Oral BID  . insulin aspart  0-20 Units Subcutaneous TID WC  . insulin aspart  0-5 Units Subcutaneous QHS  . insulin aspart  6 Units Subcutaneous TID WC  . insulin glargine  15 Units Subcutaneous Daily  . mouth rinse  15 mL Mouth Rinse BID  . methylPREDNISolone (SOLU-MEDROL) injection  60 mg Intravenous Q8H  . metoprolol tartrate  25 mg Oral BID  . potassium chloride SA  20 mEq Oral BID  . QUEtiapine  25 mg Oral q1800  . rivaroxaban  15 mg Oral Q breakfast  . sodium chloride flush  3 mL Intravenous Q12H  . umeclidinium bromide  1 puff Inhalation Daily   Continuous Infusions: . sodium chloride    . ampicillin-sulbactam (UNASYN) IV 3 g (11/18/18 0849)    Principal Problem:   Acute respiratory failure with hypoxia and hypercapnia (HCC) Active Problems:   COPD with acute exacerbation (HCC)   Atrial flutter ([place])   Diabetes mellitus ([place])  Critical care Time spent: 30 mins   Irwin Brakeman, MD [facility] 11/18/2018, 10:21 AM    LOS: 2 days  How to contact the [facility] Attending or Consulting provider [place] or covering provider during after hours [place], for this patient?  1. Check the care team in [facility] and look for a) attending/consulting TRH provider listed and b) the [facility] team listed 2. Log into [email or web address] and use [place]'s universal password to access. If you do not have the password, please contact the hospital operator. 3. Locate the [facility] provider you are looking for under [facility] and page to a number that you can be directly reached. 4. If you still have difficulty reaching the provider, please page the [facility] (Director on Call) for the Hospitalists listed on amion for assistance.

## 2018-11-18 NOTE — Care Management Important Message (Signed)
Important Message  Patient Details  Name: Eileen [name] MRN: [id number] Date of Birth: [birth year]   Medicare Important Message Given:  Yes     [name] 11/18/2018, 11:49 AM

## 2018-11-18 NOTE — Progress Notes (Signed)
Patient to be transferred to telemetry bed and in stable condition. Patient and daughter made aware of transfer and agreeable. Patient report given to accepting RN. Patient to be transferred to room 338 by staff via wheelchair.  Celestia Khat, RN

## 2018-11-19 ENCOUNTER — Inpatient Hospital Stay (HOSPITAL_COMMUNITY): Payer: Medicare Other

## 2018-11-19 DIAGNOSIS — Z515 Encounter for palliative care: Secondary | ICD-10-CM

## 2018-11-19 DIAGNOSIS — J690 Pneumonitis due to inhalation of food and vomit: Secondary | ICD-10-CM

## 2018-11-19 DIAGNOSIS — Z7189 Other specified counseling: Secondary | ICD-10-CM

## 2018-11-19 DIAGNOSIS — R1310 Dysphagia, unspecified: Secondary | ICD-10-CM

## 2018-11-19 LAB — CBC WITH DIFFERENTIAL/PLATELET
Abs Immature Granulocytes: 0.04 10*3/uL (ref 0.00–0.07)
Basophils Absolute: 0 10*3/uL (ref 0.0–0.1)
Basophils Relative: 0 %
Eosinophils Absolute: 0 10*3/uL (ref 0.0–0.5)
Eosinophils Relative: 0 %
HCT: 32.7 % — ABNORMAL LOW (ref 36.0–46.0)
Hemoglobin: 9.1 g/dL — ABNORMAL LOW (ref 12.0–15.0)
Immature Granulocytes: 0 %
Lymphocytes Relative: 4 %
Lymphs Abs: 0.4 10*3/uL — ABNORMAL LOW (ref 0.7–4.0)
MCH: 27.2 pg (ref 26.0–34.0)
MCHC: 27.8 g/dL — ABNORMAL LOW (ref 30.0–36.0)
MCV: 97.9 fL (ref 80.0–100.0)
Monocytes Absolute: 0.7 10*3/uL (ref 0.1–1.0)
Monocytes Relative: 6 %
Neutro Abs: 9.8 10*3/uL — ABNORMAL HIGH (ref 1.7–7.7)
Neutrophils Relative %: 90 %
Platelets: 350 10*3/uL (ref 150–400)
RBC: 3.34 MIL/uL — ABNORMAL LOW (ref 3.87–5.11)
RDW: 14.1 % (ref 11.5–15.5)
WBC: 10.9 10*3/uL — ABNORMAL HIGH (ref 4.0–10.5)
nRBC: 0.2 % (ref 0.0–0.2)

## 2018-11-19 LAB — BASIC METABOLIC PANEL
Anion gap: 15 (ref 5–15)
BUN: 30 mg/dL — ABNORMAL HIGH (ref 8–23)
CO2: 36 mmol/L — ABNORMAL HIGH (ref 22–32)
Calcium: 9.3 mg/dL (ref 8.9–10.3)
Chloride: 89 mmol/L — ABNORMAL LOW (ref 98–111)
Creatinine, Ser: 1.01 mg/dL — ABNORMAL HIGH (ref 0.44–1.00)
GFR calc Af Amer: >60 mL/min (ref >60)
GFR calc non Af Amer: 55 mL/min — ABNORMAL LOW (ref >60)
Glucose, Bld: 251 mg/dL — ABNORMAL HIGH (ref 70–99)
Potassium: 5.2 mmol/L — ABNORMAL HIGH (ref 3.5–5.1)
Sodium: 140 mmol/L (ref 135–145)

## 2018-11-19 LAB — GLUCOSE, CAPILLARY
Glucose-Capillary: 103 mg/dL — ABNORMAL HIGH (ref 70–99)
Glucose-Capillary: 132 mg/dL — ABNORMAL HIGH (ref 70–99)
Glucose-Capillary: 195 mg/dL — ABNORMAL HIGH (ref 70–99)
Glucose-Capillary: 208 mg/dL — ABNORMAL HIGH (ref 70–99)
Glucose-Capillary: 21 mg/dL — CL (ref 70–99)
Glucose-Capillary: 251 mg/dL — ABNORMAL HIGH (ref 70–99)
Glucose-Capillary: 263 mg/dL — ABNORMAL HIGH (ref 70–99)

## 2018-11-19 LAB — BLOOD GAS, ARTERIAL
Acid-Base Excess: 16.2 mmol/L — ABNORMAL HIGH (ref 0.0–2.0)
Bicarbonate: 38.2 mmol/L — ABNORMAL HIGH (ref 20.0–28.0)
FIO2: 80
O2 Saturation: 52.7 %
Patient temperature: 37
pCO2 arterial: 57.1 mmHg — ABNORMAL HIGH (ref 32.0–48.0)
pH, Arterial: 7.471 — ABNORMAL HIGH (ref 7.350–7.450)
pO2, Arterial: <31 mmHg — CL (ref 83.0–108.0)

## 2018-11-19 LAB — COMPREHENSIVE METABOLIC PANEL
ALT: 20 U/L (ref 0–44)
AST: 14 U/L — ABNORMAL LOW (ref 15–41)
Albumin: 3.1 g/dL — ABNORMAL LOW (ref 3.5–5.0)
Alkaline Phosphatase: 62 U/L (ref 38–126)
Anion gap: 10 (ref 5–15)
BUN: 28 mg/dL — ABNORMAL HIGH (ref 8–23)
CO2: 39 mmol/L — ABNORMAL HIGH (ref 22–32)
Calcium: 9.1 mg/dL (ref 8.9–10.3)
Chloride: 93 mmol/L — ABNORMAL LOW (ref 98–111)
Creatinine, Ser: 0.84 mg/dL (ref 0.44–1.00)
GFR calc Af Amer: >60 mL/min (ref >60)
GFR calc non Af Amer: >60 mL/min (ref >60)
Glucose, Bld: 219 mg/dL — ABNORMAL HIGH (ref 70–99)
Potassium: 5.4 mmol/L — ABNORMAL HIGH (ref 3.5–5.1)
Sodium: 142 mmol/L (ref 135–145)
Total Bilirubin: 0.6 mg/dL (ref 0.3–1.2)
Total Protein: 6 g/dL — ABNORMAL LOW (ref 6.5–8.1)

## 2018-11-19 LAB — MAGNESIUM: Magnesium: 1.9 mg/dL (ref 1.7–2.4)

## 2018-11-19 MED ORDER — INSULIN ASPART 100 UNIT/ML ~~LOC~~ SOLN
0.0000 [IU] | SUBCUTANEOUS | Status: DC
  Administered 2018-11-19: 22:00:00 3 [IU] via SUBCUTANEOUS
  Administered 2018-11-20: 21:00:00 7 [IU] via SUBCUTANEOUS
  Administered 2018-11-20: 11:00:00 20 [IU] via SUBCUTANEOUS
  Administered 2018-11-20 (×2): 7 [IU] via SUBCUTANEOUS
  Administered 2018-11-20: 4 [IU] via SUBCUTANEOUS
  Administered 2018-11-20 – 2018-11-21 (×2): 11 [IU] via SUBCUTANEOUS
  Administered 2018-11-21: 4 [IU] via SUBCUTANEOUS
  Administered 2018-11-21: 16:00:00 7 [IU] via SUBCUTANEOUS
  Administered 2018-11-21 (×3): 4 [IU] via SUBCUTANEOUS
  Administered 2018-11-22 (×2): 3 [IU] via SUBCUTANEOUS
  Administered 2018-11-22: 11 [IU] via SUBCUTANEOUS

## 2018-11-19 MED ORDER — DEXTROSE 50 % IV SOLN
1.0000 | Freq: Once | INTRAVENOUS | Status: AC
  Administered 2018-11-19: 50 mL via INTRAVENOUS

## 2018-11-19 MED ORDER — FUROSEMIDE 10 MG/ML IJ SOLN: 60.0000 mg | Freq: Every day | INTRAMUSCULAR | Status: DC

## 2018-11-19 MED ORDER — FUROSEMIDE 10 MG/ML IJ SOLN
40.0000 mg | Freq: Every day | INTRAMUSCULAR | Status: DC
  Administered 2018-11-20 – 2018-11-22 (×3): 40 mg via INTRAVENOUS
  Filled 2018-11-19 (×3): qty 4

## 2018-11-19 MED ORDER — METHYLPREDNISOLONE SODIUM SUCC 125 MG IJ SOLR
80.0000 mg | Freq: Three times a day (TID) | INTRAMUSCULAR | Status: DC
  Administered 2018-11-19 – 2018-11-21 (×7): 80 mg via INTRAVENOUS
  Filled 2018-11-19 (×7): qty 2

## 2018-11-19 MED ORDER — FUROSEMIDE 10 MG/ML IJ SOLN
60.0000 mg | Freq: Every day | INTRAMUSCULAR | Status: DC
  Administered 2018-11-19: 60 mg via INTRAVENOUS
  Filled 2018-11-19: qty 6

## 2018-11-19 MED ORDER — LORAZEPAM 2 MG/ML IJ SOLN: 0.5000 mg | INTRAMUSCULAR | Status: DC | PRN

## 2018-11-19 MED ORDER — METOPROLOL TARTRATE 5 MG/5ML IV SOLN
2.5000 mg | Freq: Four times a day (QID) | INTRAVENOUS | Status: DC
  Administered 2018-11-19 – 2018-11-22 (×11): 2.5 mg via INTRAVENOUS
  Filled 2018-11-19 (×10): qty 5

## 2018-11-19 MED ORDER — RIVAROXABAN 20 MG PO TABS
20.0000 mg | ORAL_TABLET | Freq: Every day | ORAL | Status: DC
  Administered 2018-11-19 – 2018-11-22 (×4): 20 mg via ORAL
  Filled 2018-11-19 (×4): qty 1

## 2018-11-19 NOTE — Progress Notes (Signed)
Subjective: She says she does not feel as well this morning.  She had done pretty well and then had a fairly acute exacerbation.  Objective: Vital signs in last 24 hours: Temp:  [97.8 F (36.6 C)-98.4 F (36.9 C)] 98.4 F (36.9 C) (09/08 0602) Pulse Rate:  [79-121] 79 (09/08 0602) Resp:  [16-37] 19 (09/08 0602) BP: (102-148)/(58-96) 121/74 (09/08 0602) SpO2:  [92 %-100 %] 94 % (09/08 0733) Weight:  [68.4 kg] 68.4 kg (09/08 0602) Weight change: -1.9 kg Last BM Date: 11/17/18  Intake/Output from previous day: 09/07 0701 - 09/08 0700 In: 957.8 [P.O.:472; I.V.:85.8; IV Piggyback:400] Out: -   PHYSICAL EXAM General appearance: alert, cooperative and moderate distress Resp: Her lungs are tight with wheezing and rhonchi Cardio: regular rate and rhythm, S1, S2 normal, no murmur, click, rub or gallop GI: soft, non-tender; bowel sounds normal; no masses,  no organomegaly Extremities: extremities normal, atraumatic, no cyanosis or edema  Lab Results:  Results for orders placed or performed during the hospital encounter of 11/15/18 (from the past 48 hour(s))  Glucose, capillary     Status: Abnormal   Collection Time: 11/17/18 11:52 AM  Result Value Ref Range   Glucose-Capillary 120 (H) 70 - 99 mg/dL  Glucose, capillary     Status: Abnormal   Collection Time: 11/17/18  5:19 PM  Result Value Ref Range   Glucose-Capillary 202 (H) 70 - 99 mg/dL  Glucose, capillary     Status: Abnormal   Collection Time: 11/17/18  9:19 PM  Result Value Ref Range   Glucose-Capillary 190 (H) 70 - 99 mg/dL  Glucose, capillary     Status: Abnormal   Collection Time: 11/18/18  3:10 AM  Result Value Ref Range   Glucose-Capillary 202 (H) 70 - 99 mg/dL  CBC     Status: Abnormal   Collection Time: 11/18/18  4:34 AM  Result Value Ref Range   WBC 11.3 (H) 4.0 - 10.5 K/uL   RBC 3.55 (L) 3.87 - 5.11 MIL/uL   Hemoglobin 9.7 (L) 12.0 - 15.0 g/dL   HCT 34.9 (L) 36.0 - 46.0 %   MCV 98.3 80.0 - 100.0 fL   MCH  27.3 26.0 - 34.0 pg   MCHC 27.8 (L) 30.0 - 36.0 g/dL   RDW 13.9 11.5 - 15.5 %   Platelets 377 150 - 400 K/uL   nRBC 0.0 0.0 - 0.2 %    Comment: Performed at [facility], [address] [place], [place] [address]  Glucose, capillary     Status: Abnormal   Collection Time: 11/18/18  7:29 AM  Result Value Ref Range   Glucose-Capillary 194 (H) 70 - 99 mg/dL  Glucose, capillary     Status: Abnormal   Collection Time: 11/18/18 11:22 AM  Result Value Ref Range   Glucose-Capillary 232 (H) 70 - 99 mg/dL  Glucose, capillary     Status: Abnormal   Collection Time: 11/18/18  4:11 PM  Result Value Ref Range   Glucose-Capillary 256 (H) 70 - 99 mg/dL  Glucose, capillary     Status: Abnormal   Collection Time: 11/18/18 10:06 PM  Result Value Ref Range   Glucose-Capillary 278 (H) 70 - 99 mg/dL  Glucose, capillary     Status: Abnormal   Collection Time: 11/19/18  3:16 AM  Result Value Ref Range   Glucose-Capillary 195 (H) 70 - 99 mg/dL   Comment 1 Notify RN    Comment 2 Document in Chart   CBC with Differential/Platelet  Status: Abnormal   Collection Time: 11/19/18  6:07 AM  Result Value Ref Range   WBC 10.9 (H) 4.0 - 10.5 K/uL   RBC 3.34 (L) 3.87 - 5.11 MIL/uL   Hemoglobin 9.1 (L) 12.0 - 15.0 g/dL   HCT 32.7 (L) 36.0 - 46.0 %   MCV 97.9 80.0 - 100.0 fL   MCH 27.2 26.0 - 34.0 pg   MCHC 27.8 (L) 30.0 - 36.0 g/dL   RDW 14.1 11.5 - 15.5 %   Platelets 350 150 - 400 K/uL   nRBC 0.2 0.0 - 0.2 %   Neutrophils Relative % 90 %   Neutro Abs 9.8 (H) 1.7 - 7.7 K/uL   Lymphocytes Relative 4 %   Lymphs Abs 0.4 (L) 0.7 - 4.0 K/uL   Monocytes Relative 6 %   Monocytes Absolute 0.7 0.1 - 1.0 K/uL   Eosinophils Relative 0 %   Eosinophils Absolute 0.0 0.0 - 0.5 K/uL   Basophils Relative 0 %   Basophils Absolute 0.0 0.0 - 0.1 K/uL   Immature Granulocytes 0 %   Abs Immature Granulocytes 0.04 0.00 - 0.07 K/uL    Comment: Performed at [facility], [address], [place], [place] [address]   Comprehensive metabolic panel     Status: Abnormal   Collection Time: 11/19/18  6:07 AM  Result Value Ref Range   Sodium 142 135 - 145 mmol/L   Potassium 5.4 (H) 3.5 - 5.1 mmol/L   Chloride 93 (L) 98 - 111 mmol/L   CO2 39 (H) 22 - 32 mmol/L   Glucose, Bld 219 (H) 70 - 99 mg/dL   BUN 28 (H) 8 - 23 mg/dL   Creatinine, Ser 0.84 0.44 - 1.00 mg/dL   Calcium 9.1 8.9 - 10.3 mg/dL   Total Protein 6.0 (L) 6.5 - 8.1 g/dL   Albumin 3.1 (L) 3.5 - 5.0 g/dL   AST 14 (L) 15 - 41 U/L   ALT 20 0 - 44 U/L   Alkaline Phosphatase 62 38 - 126 U/L   Total Bilirubin 0.6 0.3 - 1.2 mg/dL   GFR calc non Af Amer >60 >60 mL/min   GFR calc Af Amer >60 >60 mL/min   Anion gap 10 5 - 15    Comment: Performed at [facility], [address] [place], [place] [address]  Magnesium     Status: None   Collection Time: 11/19/18  6:07 AM  Result Value Ref Range   Magnesium 1.9 1.7 - 2.4 mg/dL    Comment: Performed at [facility], [address], [place], [place] [address]  Glucose, capillary     Status: Abnormal   Collection Time: 11/19/18  7:54 AM  Result Value Ref Range   Glucose-Capillary 251 (H) 70 - 99 mg/dL    ABGS Recent Labs    11/16/18 0830  PHART 7.324*  PO2ART 67.4*  HCO3 34.9*   CULTURES Recent Results (from the past 240 hour(s))  SARS Coronavirus 2 [facility] order, Performed in [place] hospital lab)     Status: None   Collection Time: 11/15/18 11:10 PM  Result Value Ref Range Status   SARS Coronavirus 2 NEGATIVE NEGATIVE Final    Comment: (NOTE) If result is NEGATIVE SARS-CoV-2 target nucleic acids are NOT DETECTED. The SARS-CoV-2 RNA is generally detectable in upper and lower  respiratory specimens during the acute phase of infection. The lowest  concentration of SARS-CoV-2 viral copies this assay can detect is 250  copies / mL. A negative result  does not preclude SARS-CoV-2 infection  and should not be used as the sole basis for treatment or other  patient management decisions.  A  negative result may occur with  improper specimen collection / handling, submission of specimen other  than nasopharyngeal swab, presence of viral mutation(s) within the  areas targeted by this assay, and inadequate number of viral copies  (<250 copies / mL). A negative result must be combined with clinical  observations, patient history, and epidemiological information. If result is POSITIVE SARS-CoV-2 target nucleic acids are DETECTED. The SARS-CoV-2 RNA is generally detectable in upper and lower  respiratory specimens dur ing the acute phase of infection.  Positive  results are indicative of active infection with SARS-CoV-2.  Clinical  correlation with patient history and other diagnostic information is  necessary to determine patient infection status.  Positive results do  not rule out bacterial infection or co-infection with other viruses. If result is PRESUMPTIVE POSTIVE SARS-CoV-2 nucleic acids MAY BE PRESENT.   A presumptive positive result was obtained on the submitted specimen  and confirmed on repeat testing.  While 2019 novel coronavirus  (SARS-CoV-2) nucleic acids may be present in the submitted sample  additional confirmatory testing may be necessary for epidemiological  and / or clinical management purposes  to differentiate between  SARS-CoV-2 and other Sarbecovirus currently known to infect humans.  If clinically indicated additional testing with an alternate test  methodology [phone]) is advised. The SARS-CoV-2 RNA is generally  detectable in upper and lower respiratory sp ecimens during the acute  phase of infection. The expected result is Negative. Fact Sheet for Patients:  StrictlyIdeas.no Fact Sheet for Healthcare Providers: BankingDealers.co.za This test is not yet approved or cleared by the [place] FDA and has been authorized for detection and/or diagnosis of SARS-CoV-2 by FDA under an Emergency Use  Authorization (EUA).  This EUA will remain in effect (meaning this test can be used) for the duration of the COVID-19 declaration under Section 564(b)(1) of the Act, 21 U.S.C. section 360bbb-3(b)(1), unless the authorization is terminated or revoked sooner. Performed at [facility], [address], [place], [place] [address]    Studies/Results: Dg Chest Port 1 View  Result Date: 11/19/2018 CLINICAL DATA:  Acute respiratory failure with hypoxia. EXAM: PORTABLE CHEST 1 VIEW COMPARISON:  Radiograph of November 17, 2018. FINDINGS: Stable cardiomegaly. No pneumothorax is noted. Increased right basilar atelectasis or infiltrate is noted. Minimal left basilar subsegmental atelectasis is noted. Small pleural effusions may be present. Bony thorax is unremarkable. IMPRESSION: Increased right basilar opacity as described above. Electronically Signed   By: Marijo Conception M.D.   On: 11/19/2018 07:29    Medications:  Prior to Admission:  Medications Prior to Admission  Medication Sig Dispense Refill Last Dose  . acetaminophen (TYLENOL) 500 MG tablet Take 500 mg by mouth every 8 (eight) hours as needed for mild pain or headache.    11/15/2018 at Unknown time  . albuterol (PROVENTIL) (2.5 MG/3ML) 0.083% nebulizer solution Take 2.5 mg by nebulization every 6 (six) hours as needed for wheezing or shortness of breath.   11/15/2018 at Unknown time  . furosemide (LASIX) 40 MG tablet Take 1.5 tablets (60 mg total) by mouth daily. 30 tablet  Past Week at Unknown time  . metFORMIN (GLUCOPHAGE-XR) 500 MG 24 hr tablet Take 500 mg by mouth daily with breakfast.   1 11/15/2018 at Unknown time  . metoprolol tartrate (LOPRESSOR) 50 MG tablet Take 50 mg by mouth 2 (two)  times daily.   11/15/2018 at Unknown time  . polyethylene glycol (MIRALAX / GLYCOLAX) 17 g packet Take 17 g by mouth daily as needed for mild constipation. 28 each 0 unknown  . potassium chloride SA (K-DUR) 20 MEQ tablet Take 20 mEq by mouth 2 (two) times daily.    11/15/2018 at Unknown time  . PROAIR HFA 108 (90 BASE) MCG/ACT inhaler Inhale 2 puffs into the lungs every 6 (six) hours as needed for wheezing or shortness of breath.    unknown  . QUEtiapine (SEROQUEL) 25 MG tablet Take 1 tablet (25 mg total) by mouth daily at 6 PM. 30 tablet 3 11/15/2018 at Unknown time  . rivaroxaban (XARELTO) 20 MG TABS tablet Take 1 tablet (20 mg total) by mouth daily with supper. Stop Xarelto until Monday (08/26/18) in anticipation for kyphoplasty 21 tablet 0 11/15/2018 at 1800  . TRELEGY ELLIPTA 100-62.5-25 MCG/INH AEPB Take 1 puff by mouth daily.  0 11/15/2018 at Unknown time   Scheduled: . albuterol  2.5 mg Nebulization Q6H  . Chlorhexidine Gluconate Cloth  6 each Topical Daily  . docusate sodium  100 mg Oral Daily  . fluticasone furoate-vilanterol  1 puff Inhalation Daily  . furosemide  60 mg Intravenous Daily  . guaiFENesin  600 mg Oral BID  . insulin aspart  0-20 Units Subcutaneous TID WC  . insulin aspart  0-5 Units Subcutaneous QHS  . insulin aspart  10 Units Subcutaneous TID WC  . insulin glargine  24 Units Subcutaneous Daily  . mouth rinse  15 mL Mouth Rinse BID  . methylPREDNISolone (SOLU-MEDROL) injection  40 mg Intravenous Q8H  . metoprolol tartrate  25 mg Oral BID  . QUEtiapine  25 mg Oral q1800  . rivaroxaban  20 mg Oral Q breakfast  . sodium chloride flush  3 mL Intravenous Q12H  . umeclidinium bromide  1 puff Inhalation Daily   Continuous: . sodium chloride 250 mL (11/19/18 0214)  . ampicillin-sulbactam (UNASYN) IV 3 g (11/19/18 0215)   [id number] chloride, acetaminophen **OR** acetaminophen, albuterol, HYDROcodone-acetaminophen, polyethylene glycol, sodium chloride flush  Assesment: She was admitted with acute hypoxic and hypercapnic respiratory failure.  She required BiPAP initially.  She is now on high flow nasal cannula.  She had been transferred from stepdown yesterday.  She had what seemed to be an episode of aspiration yesterday and her chest  x-ray today which I have personally reviewed does show an increase in the infiltrate on the right lung.  She did not have an overt episode of aspiration this morning.  At baseline she has severe COPD and she has acute exacerbation which is being treated  She has dementia with sundowning and she is on Seroquel for that Principal Problem:   Acute respiratory failure with hypoxia and hypercapnia ([place]) Active Problems:   COPD with acute exacerbation (HCC)   Atrial flutter ([place])   Diabetes mellitus ([place])    Plan: Continue treatments.  See what she does over the next hour or so.  She may have to transfer back to stepdown.  Discussed with Dr. Wynetta Emery hospitalist attending    LOS: 3 days   Eileen [name] 11/19/2018, 8:17 AM

## 2018-11-19 NOTE — Progress Notes (Signed)
Pt transferred to ICU via stretcher. Awake, alert, oriented. Bedside report given to Thurmond Butts, RN in ICU. Dr. Wynetta Emery remains at bedside.

## 2018-11-19 NOTE — Plan of Care (Signed)

## 2018-11-19 NOTE — Progress Notes (Signed)
Dr. Wynetta Emery in to evaluate patient. RT drawing ABG per order.

## 2018-11-19 NOTE — Progress Notes (Signed)
SLP Cancellation Note  Patient Details Name: Eileen [name] MRN: [id number] DOB: 31-Dec-1945   Cancelled treatment:       Reason Eval/Treat Not Completed: Medical issues which prohibited therapy; pt currently on Bipap; ST will continue efforts as pt able.   Elvina Sidle, M.S., [place] 11/19/2018, 12:32 PM

## 2018-11-19 NOTE — Progress Notes (Signed)
Rt reports panic PO2 of <31. Dr. Wynetta Emery notified. Pt has been assigned bed in ICU. Remains alert and oriented, current SaO2 77% per monitor,  Heart rate 118 per dinamap. Resp rate 22/min. Dr. Wynetta Emery remains at bedside.

## 2018-11-19 NOTE — Progress Notes (Signed)
Dr. Wynetta Emery changed blood sugars to q4h but no insulin administration - MD paged but no orders or return call. Dr. Baltazar Najjar paged to see if insulin admin could be changed to q4h to match blood sugar checks. Waiting for call back/orders. Will continue to  Monitor pt

## 2018-11-19 NOTE — Progress Notes (Signed)
Pt has been resting quietly this am. Resp slightly labored. Upon rounding, SaO2 probe placed and unable to get reading > 84%. Pt is awake, alert, answering all questions approriately, resp rate 22/min, lungs diminished bilaterally. Able to cough and deep breathe without difficulty. Have used 3 different probes on fingers and ears, still getting poor waveforms. Called RT to come and assist with assessing SaO2. RT to room, increased O2 from 5 lpm to 10 lpm. Reading obtained at 90% but did not correlate with heartrate (which is steady in 100-112 range). Dr. Wynetta Emery notified, orders received to move patient to ICU.

## 2018-11-19 NOTE — Consult Note (Signed)
Consultation Note Date: 11/19/2018   Patient Name: Eileen [name]  DOB: [birth year]  MRN: [id number]  Age / Sex: 73 y.o., female  PCP: Lavella Lemons, PA Referring Physician: Murlean Iba, MD  Reason for Consultation: Establishing goals of care  HPI/Patient Profile: 73 y.o. female  with past medical history of dementia, severe COPD, recurrent pneumonia (aspiration?), atrial flutter, HTN, mod mitral regurgitation, mild-mod pulmonary hypertension, iron deficiency anemia, diabetes, diverticulosis, h/o R breast cancer in remission, h/o L1 compression fracture s/p kyphoplasty, 8 admissions over past 6 months mostly d/t COPD and pneumonia admitted on 11/15/2018 with respiratory distress and decreased oxygen saturations with COPD exacerbation initially requiring BiPAP. Concern for aspiration event 9/7 and now with infiltrates in right lung base.   Clinical Assessment and Goals of Care: I met today at Eileen [name] bedside. She is alert and conversant without any distress but complains of SOB. I worked with RN to obtain accurate saturation reading and ultimately oxygen saturations only found to be in 50s. RT and MD were notified and subsequent transfer to ICU with BiPAP.   During the course of this interaction I has able to speak with Eileen [name] and she tells me that she has 5 children ([place], [place], Legrand [place], [place], Corene Cornea) and lives with [place]. Eileen [name] and I discuss her [employer] and wishes. She tells me that she and her family have never discussed this and that she is not sure what she wants but will leave this up to her family. However, Eileen [name] also voices to me that she is ready and prepared when her time comes. Her hope is to feel better and to return home. She again tells Dr. Wynetta Emery that she does not know what she wants but to leave these decisions to her family.   Update: I met again in the afternoon  with Eileen [name] who is now talking to me about going to heaven and being ready to go. She indicates at this time that she would not want CPR/intubation but difficult to have more in depth conversation with BiPAP. Shortly after this her daughter, [name], arrived. I explained to [facility] the events of the day. I explained my conversations with her mother today and my concern that she is now speaking about dying and going to heaven. I explained that although we are doing everything to prevent this from happening that I always worry when my patients begin to speak in this way. [name] understands. [name] believes that her mother and family would want intubation and full aggressive care at this time. She has a brother scheduled to be released from prison on Monday. [name] is confident that BiPAP will be successful as this has always been the case. I told [name] that I am hopeful this will be successful too but there will be a day when this is not enough and we do need to plan for how to handle this when that day comes.   At this time full, aggressive care is desired. Eileen [name] has indicated she would  not want intubation BUT this is has been very inconsistent and family does not support this decision and feel that she likely did not understand what she was saying. [name] did agree that if her mother did not improve or declined further than we will arrange family meeting tomorrow to further discuss decisions. [name] was clear that they are not interested in SNF or hospice at this time.   All questions/concerns addressed. Emotional support provided. I will continue to follow and attempt to build rapport to continue conversation.   Primary Decision Maker PATIENT with assistance of family (10 of adult children surrogate decision makers if patient unable to make decisions)    SUMMARY OF RECOMMENDATIONS   - Full code, full aggressive care - Will continue conversation  Code Status/Advance Care Planning:   Full code   Symptom Management:   Per primary and pulmonary.   Palliative Prophylaxis:   Aspiration, Delirium Protocol, Frequent Pain Assessment, Oral Care and Turn Reposition  Additional Recommendations (Limitations, Scope, Preferences):  Avoid Hospitalization  Psycho-social/Spiritual:   Desire for further Chaplaincy support:yes  Additional Recommendations: Education on Hospice  Prognosis:   Overall poor prognosis < 6 months with recurrent aspiration, pneumonia, and COPD exacerbations.   Discharge Planning: To Be Determined      Primary Diagnoses: Present on Admission: . Acute respiratory failure with hypoxia and hypercapnia (HCC) . COPD with acute exacerbation ([place]) . Atrial flutter ([place])   I have reviewed the medical record, interviewed the patient and family, and examined the patient. The following aspects are pertinent.  Past Medical History:  Diagnosis Date  . Asthma   . Atrial flutter ([place])   . Cancer [facility])    Right breast  . COPD (chronic obstructive pulmonary disease) ([place])   . Dementia ([place])   . History of breast cancer    right breast  . Hypertension   . On home O2   . Type 2 diabetes mellitus ([place])    Social History   Socioeconomic History  . Marital status: Single    Spouse name: Not on file  . Number of children: Not on file  . Years of education: Not on file  . Highest education level: Not on file  Occupational History  . Not on file  Social Needs  . Financial resource strain: Not hard at all  . Food insecurity    Worry: Never true    Inability: Never true  . Transportation needs    Medical: Patient refused    Non-medical: Patient refused  Tobacco Use  . Smoking status: Former Smoker    Packs/day: 0.50    Years: 32.00    Pack years: 16.00    Types: Cigarettes    Start date: 12/12/1962    Quit date: 03/13/1994    Years since quitting: 24.7  . Smokeless tobacco: Never Used  Substance and Sexual Activity  . Alcohol use: No     Alcohol/week: 0.0 standard drinks  . Drug use: No  . Sexual activity: Not on file  Lifestyle  . Physical activity    Days per week: Patient refused    Minutes per session: Patient refused  . Stress: Not at all  Relationships  . Social Herbalist on phone: Three times a week    Gets together: Three times a week    Attends religious service: More than 4 times per year    Active member of club or organization: Yes    Attends meetings of clubs or organizations:  More than 4 times per year    Relationship status: Never married  Other Topics Concern  . Not on file  Social History Narrative  . Not on file   Family History  Problem Relation Age of Onset  . Cancer Mother        lung  . Diabetes Brother    Scheduled Meds: . albuterol  2.5 mg Nebulization Q6H  . Chlorhexidine Gluconate Cloth  6 each Topical Daily  . docusate sodium  100 mg Oral Daily  . fluticasone furoate-vilanterol  1 puff Inhalation Daily  . furosemide  60 mg Intravenous Daily  . guaiFENesin  600 mg Oral BID  . insulin aspart  0-20 Units Subcutaneous TID WC  . insulin aspart  0-5 Units Subcutaneous QHS  . insulin aspart  10 Units Subcutaneous TID WC  . insulin glargine  24 Units Subcutaneous Daily  . mouth rinse  15 mL Mouth Rinse BID  . methylPREDNISolone (SOLU-MEDROL) injection  40 mg Intravenous Q8H  . metoprolol tartrate  25 mg Oral BID  . QUEtiapine  25 mg Oral q1800  . rivaroxaban  20 mg Oral Q breakfast  . sodium chloride flush  3 mL Intravenous Q12H  . umeclidinium bromide  1 puff Inhalation Daily   Continuous Infusions: . sodium chloride 250 mL (11/19/18 0214)  . ampicillin-sulbactam (UNASYN) IV 3 g (11/19/18 0215)   PRN Meds:.sodium chloride, acetaminophen **OR** acetaminophen, albuterol, HYDROcodone-acetaminophen, polyethylene glycol, sodium chloride flush Allergies  Allergen Reactions  . Codeine Other (See Comments)    "jittery"   Review of Systems  Constitutional: Positive for  appetite change and fatigue.  Respiratory: Positive for cough and shortness of breath.   Gastrointestinal: Positive for abdominal distention.  Neurological: Positive for weakness.    Physical Exam Vitals signs and nursing note reviewed.  Constitutional:      General: She is not in acute distress.    Appearance: She is well-developed and well-groomed. She is ill-appearing.  Cardiovascular:     Rate and Rhythm: Regular rhythm. Tachycardia present.  Pulmonary:     Effort: Tachypnea present. No accessory muscle usage or respiratory distress.     Breath sounds: Decreased breath sounds present.  Abdominal:     General: There is distension.     Palpations: Abdomen is soft.     Tenderness: There is no abdominal tenderness.  Neurological:     Mental Status: She is alert.     Comments: Oriented to person, place, somewhat to situation     Vital Signs: BP 121/74 (BP Location: Right Arm)   Pulse 79   Temp 98.4 F (36.9 C) (Oral)   Resp 19   Ht 5' 7"  (1.702 m)   Wt 68.4 kg   SpO2 94%   BMI 23.62 kg/m  Pain Scale: 0-10 POSS *See Group Information*: 2-Acceptable,Slightly drowsy, easily aroused Pain Score: Asleep   SpO2: SpO2: 94 % O2 Device:SpO2: 94 % O2 Flow Rate: .O2 Flow Rate (L/min): 4 L/min  IO: Intake/output summary:   Intake/Output Summary (Last 24 hours) at 11/19/2018 0905 Last data filed at 11/19/2018 0700 Gross per 24 hour  Intake 957.81 ml  Output -  Net 957.81 ml    LBM: Last BM Date: 11/17/18 Baseline Weight: Weight: 68.4 kg Most recent weight: Weight: 68.4 kg     Palliative Assessment/Data:     Time In/Out: 1015-1100, 1445-1530 Time Total: 90 min Greater than 50%  of this time was spent counseling and coordinating care related to the above assessment  and plan.  Signed by: Vinie Sill, NP Palliative Medicine Team Pager # [phone] (M-F 8a-5p) Team Phone # [phone] (Nights/Weekends)

## 2018-11-19 NOTE — Progress Notes (Signed)
PROGRESS NOTE  Eileen [name]  [id number]  DOB: 1945/05/18  DOA: 11/15/2018 PCP: Lavella Lemons, PA  Brief Admission Hx: 73 y/o female with pulmonary hypertension, COPD, chronic respiratory failure on 3.5 L oxygen at baseline presented with severe hypoxia and acute on chronic respiratory failure with hypoxia and severe COPD exacerbation requiring BiPAP therapy and stepdown ICU placement.  MDM/Assessment & Plan:   1. Acute on chronic respiratory failure with hypoxia-secondary to severe acute COPD exacerbation-She was initially weaned off BiPAP but after she aspirated yesterday she has developed acute pneumonitis, a worsened infiltrate and is now back on bipap with HIGH RISK for intubation. She has been moved back to stepdown ICU.  Palliative is working with patient and family to clarify goals of care.  Aspiration precautions.  Appreciate pulmonary consultation and management recommendations. ABG with severe hypoxemia, CO2 improved from admission.  Follow.  2. Aspiration Pneumonia - Pt aspirated with breakfast 11/18/18. We have started aspiration precautions, dysphagia diet and IV unasyn ordered. SLP evaluation requested.  CXR shows worsening RLL infiltrate. Discussed with consultant Dr. Luan Pulling.  3. COPD exacerbation-continue IV steroids scheduled nebs and antibiotics. 4. Type 2 diabetes mellitus, poorly controlled as evidenced by hemoglobin A1c of 8.5%- steroid-induced hyperglycemia, continue to titrate insulin doses to for better glycemic control.  Monitor CBG 5 times per day and continue supplemental sliding scale coverage. 5. Atrial flutter-continue Xarelto daily and continue Lopressor but changed to IV while on bipap.  6. Mild acute diastolic CHF exacerbation-patient is diuresing well with IV Lasix and we have reduced the dose today continue to follow electrolytes closely.  Continue to follow daily weights. 7. Hyperkalemia  - DC potassium supplement, diuresing with IV lasix.  Recheck in AM.   8. Status post L1 compression fracture status post vertebroplasty - follow up with PCP.  9. Mild troponin elevation - secondary to demand ischemia.   10. History of dementia - resumed home seroquel.   DVT prophylaxis: xarelto  Code Status: full  Family Communication: daughter update, family meeting with palliative medicine team later today  Disposition Plan: transferred to stepdown ICU care  Consultants:  Pulmonology (Dr. Luan Pulling)  Procedures:     Antimicrobials:   unasyn 9/7 >>  Subjective: Pt report that she can't catch her breath, her SOB is worse this morning.    Objective: Vitals:   11/18/18 2208 11/19/18 0602 11/19/18 0733 11/19/18 0900  BP: 110/64 121/74    Pulse: 96 79  74  Resp: 20 19  (!) 24  Temp: 98.4 F (36.9 C) 98.4 F (36.9 C)    TempSrc: Oral Oral    SpO2: 99% 96% 94% 97%  Weight:  68.4 kg    Height:        Intake/Output Summary (Last 24 hours) at 11/19/2018 1056 Last data filed at 11/19/2018 [id number] Gross per 24 hour  Intake 1197.81 ml  Output -  Net 1197.81 ml   Filed Weights   11/17/18 0819 11/18/18 0500 11/19/18 0602  Weight: 70.3 kg 69.3 kg 68.4 kg   REVIEW OF SYSTEMS  As per history otherwise all reviewed and reported negative  Exam:  General exam: awake, alert, NAD cooperative.  Respiratory system: poor air movement barely able to catch breath, bibasilar crackles. Moderate to severe increased work of breathing. Cardiovascular system: S1 & S2 heard. Tachycardic rate.  No JVD, murmurs, gallops, clicks or pedal edema. Gastrointestinal system: Abdomen is nondistended, soft and nontender. Normal bowel sounds heard. Central nervous system: Alert and oriented. No focal  neurological deficits. Extremities: 1+ pretibial edema BLEs.  Data Reviewed: Basic Metabolic Panel: Recent Labs  Lab 11/15/18 2330 11/16/18 0508 11/16/18 1312 11/19/18 0607  NA 139 137 138 142  K 5.2* 5.1 4.9 5.4*  CL 86* 89* 86* 93*  CO2 38* 35* 36* 39*  GLUCOSE  274* 193* 258* 219*  BUN 20 21 25* 28*  CREATININE 1.21* 1.05* 1.12* 0.84  CALCIUM 9.9 9.1 9.4 9.1  MG  --   --   --  1.9   Liver Function Tests: Recent Labs  Lab 11/15/18 2330 11/16/18 0508 11/19/18 0607  AST 34 24 14*  ALT 31 26 20   ALKPHOS 90 74 62  BILITOT 0.3 0.6 0.6  PROT 7.1 6.3* 6.0*  ALBUMIN 3.5 3.1* 3.1*   No results for input(s): LIPASE, AMYLASE in the last 168 hours. No results for input(s): AMMONIA in the last 168 hours. CBC: Recent Labs  Lab 11/15/18 2330 11/16/18 0508 11/18/18 0434 11/19/18 0607  WBC 12.8* 8.1 11.3* 10.9*  NEUTROABS 9.6*  --   --  9.8*  HGB 10.6* 9.9* 9.7* 9.1*  HCT 39.1 36.1 34.9* 32.7*  MCV 101.8* 101.4* 98.3 97.9  PLT 414* 348 377 350   Cardiac Enzymes: No results for input(s): CKTOTAL, CKMB, CKMBINDEX, TROPONINI in the last 168 hours. CBG (last 3)  Recent Labs    11/18/18 2206 11/19/18 0316 11/19/18 0754  GLUCAP 278* 195* 251*   Recent Results (from the past 240 hour(s))  SARS Coronavirus 2 [facility] order, Performed in [place] hospital lab)     Status: None   Collection Time: 11/15/18 11:10 PM  Result Value Ref Range Status   SARS Coronavirus 2 NEGATIVE NEGATIVE Final    Comment: (NOTE) If result is NEGATIVE SARS-CoV-2 target nucleic acids are NOT DETECTED. The SARS-CoV-2 RNA is generally detectable in upper and lower  respiratory specimens during the acute phase of infection. The lowest  concentration of SARS-CoV-2 viral copies this assay can detect is 250  copies / mL. A negative result does not preclude SARS-CoV-2 infection  and should not be used as the sole basis for treatment or other  patient management decisions.  A negative result may occur with  improper specimen collection / handling, submission of specimen other  than nasopharyngeal swab, presence of viral mutation(s) within the  areas targeted by this assay, and inadequate number of viral copies  (<250 copies / mL). A negative result must be combined  with clinical  observations, patient history, and epidemiological information. If result is POSITIVE SARS-CoV-2 target nucleic acids are DETECTED. The SARS-CoV-2 RNA is generally detectable in upper and lower  respiratory specimens dur ing the acute phase of infection.  Positive  results are indicative of active infection with SARS-CoV-2.  Clinical  correlation with patient history and other diagnostic information is  necessary to determine patient infection status.  Positive results do  not rule out bacterial infection or co-infection with other viruses. If result is PRESUMPTIVE POSTIVE SARS-CoV-2 nucleic acids MAY BE PRESENT.   A presumptive positive result was obtained on the submitted specimen  and confirmed on repeat testing.  While 2019 novel coronavirus  (SARS-CoV-2) nucleic acids may be present in the submitted sample  additional confirmatory testing may be necessary for epidemiological  and / or clinical management purposes  to differentiate between  SARS-CoV-2 and other Sarbecovirus currently known to infect humans.  If clinically indicated additional testing with an alternate test  methodology [phone]) is advised. The SARS-CoV-2 RNA is  generally  detectable in upper and lower respiratory sp ecimens during the acute  phase of infection. The expected result is Negative. Fact Sheet for Patients:  StrictlyIdeas.no Fact Sheet for Healthcare Providers: BankingDealers.co.za This test is not yet approved or cleared by the [place] FDA and has been authorized for detection and/or diagnosis of SARS-CoV-2 by FDA under an Emergency Use Authorization (EUA).  This EUA will remain in effect (meaning this test can be used) for the duration of the COVID-19 declaration under Section 564(b)(1) of the Act, 21 U.S.C. section 360bbb-3(b)(1), unless the authorization is terminated or revoked sooner. Performed at [facility], [address], [place], [place] [address]      Studies: Dg Chest [facility] 1 View  Result Date: 11/19/2018 CLINICAL DATA:  Acute respiratory failure with hypoxia. EXAM: PORTABLE CHEST 1 VIEW COMPARISON:  Radiograph of November 17, 2018. FINDINGS: Stable cardiomegaly. No pneumothorax is noted. Increased right basilar atelectasis or infiltrate is noted. Minimal left basilar subsegmental atelectasis is noted. Small pleural effusions may be present. Bony thorax is unremarkable. IMPRESSION: Increased right basilar opacity as described above. Electronically Signed   By: Marijo Conception M.D.   On: 11/19/2018 07:29   Scheduled Meds: . albuterol  2.5 mg Nebulization Q6H  . Chlorhexidine Gluconate Cloth  6 each Topical Daily  . docusate sodium  100 mg Oral Daily  . fluticasone furoate-vilanterol  1 puff Inhalation Daily  . furosemide  60 mg Intravenous Daily  . guaiFENesin  600 mg Oral BID  . insulin aspart  0-20 Units Subcutaneous TID WC  . insulin aspart  0-5 Units Subcutaneous QHS  . insulin aspart  10 Units Subcutaneous TID WC  . insulin glargine  24 Units Subcutaneous Daily  . mouth rinse  15 mL Mouth Rinse BID  . methylPREDNISolone (SOLU-MEDROL) injection  40 mg Intravenous Q8H  . metoprolol tartrate  25 mg Oral BID  . QUEtiapine  25 mg Oral q1800  . rivaroxaban  20 mg Oral Q breakfast  . sodium chloride flush  3 mL Intravenous Q12H  . umeclidinium bromide  1 puff Inhalation Daily   Continuous Infusions: . sodium chloride 250 mL (11/19/18 0214)  . ampicillin-sulbactam (UNASYN) IV 3 g (11/19/18 0904)    Principal Problem:   Acute respiratory failure with hypoxia and hypercapnia (HCC) Active Problems:   COPD with acute exacerbation (HCC)   Atrial flutter (HCC)   Diabetes mellitus ([place])   Aspiration pneumonia ([place])   Dysphagia  Critical careTime spent:  45 minutes   Irwin Brakeman, MD [facility] 11/19/2018, 10:56 AM    LOS: 3 days  How to contact the [facility] Attending or Consulting provider  [place] or covering provider during after hours [place], for this patient?  1. Check the care team in [facility] and look for a) attending/consulting TR[facility] provider listed and b) the [facility] team listed 2. Log into [email or web address] and use 's universal password to access. If you do not have the password, please contact the hospital operator. 3. Locate the [facility] provider you are looking for under [facility] and page to a number that you can be directly reached. 4. If you still have difficulty reaching the provider, please page the [facility] (Director on Call) for the Hospitalists listed on amion for assistance.

## 2018-11-20 ENCOUNTER — Ambulatory Visit: Payer: Medicare Other | Admitting: *Deleted

## 2018-11-20 LAB — CBC WITH DIFFERENTIAL/PLATELET
Abs Immature Granulocytes: 0.06 10*3/uL (ref 0.00–0.07)
Basophils Absolute: 0 10*3/uL (ref 0.0–0.1)
Basophils Relative: 0 %
Eosinophils Absolute: 0 10*3/uL (ref 0.0–0.5)
Eosinophils Relative: 0 %
HCT: 33.5 % — ABNORMAL LOW (ref 36.0–46.0)
Hemoglobin: 9.4 g/dL — ABNORMAL LOW (ref 12.0–15.0)
Immature Granulocytes: 1 %
Lymphocytes Relative: 4 %
Lymphs Abs: 0.3 10*3/uL — ABNORMAL LOW (ref 0.7–4.0)
MCH: 27.7 pg (ref 26.0–34.0)
MCHC: 28.1 g/dL — ABNORMAL LOW (ref 30.0–36.0)
MCV: 98.8 fL (ref 80.0–100.0)
Monocytes Absolute: 0.6 10*3/uL (ref 0.1–1.0)
Monocytes Relative: 7 %
Neutro Abs: 8.5 10*3/uL — ABNORMAL HIGH (ref 1.7–7.7)
Neutrophils Relative %: 88 %
Platelets: 366 10*3/uL (ref 150–400)
RBC: 3.39 MIL/uL — ABNORMAL LOW (ref 3.87–5.11)
RDW: 14.1 % (ref 11.5–15.5)
WBC: 9.5 10*3/uL (ref 4.0–10.5)
nRBC: 0 % (ref 0.0–0.2)

## 2018-11-20 LAB — COMPREHENSIVE METABOLIC PANEL
ALT: 22 U/L (ref 0–44)
AST: 14 U/L — ABNORMAL LOW (ref 15–41)
Albumin: 3.3 g/dL — ABNORMAL LOW (ref 3.5–5.0)
Alkaline Phosphatase: 61 U/L (ref 38–126)
Anion gap: 10 (ref 5–15)
BUN: 25 mg/dL — ABNORMAL HIGH (ref 8–23)
CO2: 40 mmol/L — ABNORMAL HIGH (ref 22–32)
Calcium: 9.1 mg/dL (ref 8.9–10.3)
Chloride: 91 mmol/L — ABNORMAL LOW (ref 98–111)
Creatinine, Ser: 0.82 mg/dL (ref 0.44–1.00)
GFR calc Af Amer: >60 mL/min (ref >60)
GFR calc non Af Amer: >60 mL/min (ref >60)
Glucose, Bld: 196 mg/dL — ABNORMAL HIGH (ref 70–99)
Potassium: 4.8 mmol/L (ref 3.5–5.1)
Sodium: 141 mmol/L (ref 135–145)
Total Bilirubin: 0.8 mg/dL (ref 0.3–1.2)
Total Protein: 6 g/dL — ABNORMAL LOW (ref 6.5–8.1)

## 2018-11-20 LAB — MAGNESIUM: Magnesium: 2 mg/dL (ref 1.7–2.4)

## 2018-11-20 LAB — GLUCOSE, CAPILLARY
Glucose-Capillary: 173 mg/dL — ABNORMAL HIGH (ref 70–99)
Glucose-Capillary: 213 mg/dL — ABNORMAL HIGH (ref 70–99)
Glucose-Capillary: 357 mg/dL — ABNORMAL HIGH (ref 70–99)
Glucose-Capillary: 258 mg/dL — ABNORMAL HIGH (ref 70–99)

## 2018-11-20 MED ORDER — INSULIN GLARGINE 100 UNIT/ML ~~LOC~~ SOLN: 20.0000 [IU] | Freq: Every day | SUBCUTANEOUS | Status: DC

## 2018-11-20 MED ORDER — LEVALBUTEROL HCL 0.63 MG/3ML IN NEBU: 0.6300 mg | INHALATION_SOLUTION | Freq: Four times a day (QID) | RESPIRATORY_TRACT | Status: DC | PRN

## 2018-11-20 MED ORDER — INSULIN GLARGINE 100 UNIT/ML ~~LOC~~ SOLN
20.0000 [IU] | Freq: Every day | SUBCUTANEOUS | Status: DC
  Administered 2018-11-20: 18:00:00 20 [IU] via SUBCUTANEOUS

## 2018-11-20 MED ORDER — LEVALBUTEROL HCL 0.63 MG/3ML IN NEBU
0.6300 mg | INHALATION_SOLUTION | Freq: Four times a day (QID) | RESPIRATORY_TRACT | Status: DC
  Administered 2018-11-20 – 2018-11-21 (×4): 0.63 mg via RESPIRATORY_TRACT
  Filled 2018-11-20 (×4): qty 3

## 2018-11-20 NOTE — Progress Notes (Signed)
Pt taken off BIPAP and placed on 4L . Pt is tolerating well

## 2018-11-20 NOTE — Progress Notes (Addendum)
Palliative:  HPI: 73 y.o. female  with past medical history of dementia, severe COPD, recurrent pneumonia (aspiration?), atrial flutter, HTN, mod mitral regurgitation, mild-mod pulmonary hypertension, iron deficiency anemia, diabetes, diverticulosis, h/o R breast cancer in remission, h/o L1 compression fracture s/p kyphoplasty, 8 admissions over past 6 months mostly d/t COPD and pneumonia admitted on 11/15/2018 with respiratory distress and decreased oxygen saturations with COPD exacerbation initially requiring BiPAP. Concern for aspiration event 9/7 and now with infiltrates in right lung base. Required return to ICU for BiPAP 9/8 but now titrated back to 4L 9/9.    I met again today with Eileen [name]. She is now off of BiPAP and on 4L nasal cannula. She tells me that she is feeling much better today. Her breathing is improved and denies any pain. Her only complaint is that she is hoping for something to drink as her throat is very dry (s/t BiPAP and NPO for ~24 hours).   I again reviewed with Eileen [name] her wishes and specifically if she would desire intubation if BiPAP was unsuccessful. She again shares that she is unsure about her wishes and asks about what her children would want. I told her that Eileen [name] expressed to me yesterday that she felt Eileen [name] would want full aggressive care including intubation. Eileen [name] is contemplating her thoughts about this subject. I asked her about who her surrogate decision makers would be for healthcare decisions and she tells me that Eileen [name] would be her choice to make healthcare decisions for her. I encouraged her to continue these conversations with her family.   She clarifies that we can give all her children information regarding her status. She also gives me permission to speak with Eileen [name] since I spoke with [facility] yesterday. Of note, Eileen [name] is alert and oriented today knowing herself, place, year (although she mentioned she believes it was  still August but given her time in the hospital this is not surprising).   I reached out to daughter, Eileen [name], x 2 unsuccessfully. No return call. I reached out to [facility] who tells me that Eileen [name] told her that I had called and requests that I contact her as her siblings are working and unable to speak. She assures me that she is speaking with her siblings about our conversations. We again discussed the importance of having these difficult conversations. Eileen [name] states that she knew the BiPAP would work as it always does. I reiterated to her that I am glad but also know there will come a time when BiPAP is not enough (hoping this is a long ways away) and we need to have a plan for how to handle that situation. We discussed that having a plan will take some of the stress and burden off of family when this time comes. Eileen [name] verbalizes understanding and says she agrees this is important to discuss. All questions/concerns addressed. Emotional support provided.   Exam: Alert, oriented. No distress. Breathing regular, unlabored. Generalized weakness. Abd soft. Extremities warm to touch. Normal judgement and affect.   Plan: - Continue GOC conversation.  - Would benefit from outpatient palliative services at home (if available in her area).  - Struggling to meet with family to have definitive decisions as they work and come in the evenings and those who work did not even return my call.   [place], NP Palliative Medicine Team Pager [phone] (Please see [email or web address] for schedule) Team Phone [phone]    Greater than 50%  of  this time was spent counseling and coordinating care related to the above assessment and plan

## 2018-11-20 NOTE — Progress Notes (Signed)
Inpatient Diabetes Program Recommendations  AACE/ADA: New Consensus Statement on Inpatient Glycemic Control   Target Ranges:  Prepandial:   less than 140 mg/dL      Peak postprandial:   less than 180 mg/dL (1-2 hours)      Critically ill patients:  140 - 180 mg/dL   Results for Eileen [name], Eileen [name] (MRN [id number]) as of 11/20/2018 10:33  Ref. Range 11/19/2018 07:54 11/19/2018 11:57 11/19/2018 16:38 11/19/2018 17:00 11/19/2018 20:00 11/19/2018 23:32 11/20/2018 04:03 11/20/2018 07:40  Glucose-Capillary Latest Ref Range: 70 - 99 mg/dL 251 (H)  Novolog 21 units @ 8:55 263 (H)  Novolog 21 units @ 12:03 21 (LL) 103 (H) 132 (H) 208 (H) 173 (H) 213 (H)   Review of Glycemic Control  Diabetes history: DM2 Outpatient Diabetes medications: Metformin XR 500 mg daily Current orders for Inpatient glycemic control: Novolog 0-20 units Q4H, Lantus 24 units daily  Inpatient Diabetes Program Recommendations:   Insulin-Meal Coverage: Patient was ordered Novolog 10 units TID with meals for meal coverage on 11/19/18 and patient received Novolog meal coverage with breakfast and lunch on 11/19/18. However, per chart, patient did not eat meals and should not have received Novolog meal coverage. As [name] result, glucose down to 21 mg/dl at 16:38 on 11/19/18. Noted meal coverage was discontinued yesterday afternoon.  Insulin-Basal: Noted patient did NOT receive Lantus on 11/19/18. If steroids are continued, please consider decreasing Lantus to 20 units daily (based on 68.4 kg x 0.3 units). NURSING: Please give Lantus insulin as ordered regardless of NPO status or PO intake as it is for basal insulin needs.   Thanks, Barnie Alderman, RN, MSN, CDE Diabetes Coordinator Inpatient Diabetes Program [phone] (Team Pager from 8am to 5pm)

## 2018-11-20 NOTE — Progress Notes (Signed)
PROGRESS NOTE    [name]  [id number] DOB: [birth year] DOA: 11/15/2018 PCP: Lavella Lemons, PA   Brief Narrative:  73 y/o female with pulmonary hypertension, COPD, chronic respiratory failure on 3.5 L oxygen at baseline presented with severe hypoxia and acute on chronic respiratory failure with hypoxia and severe COPD exacerbation requiring BiPAP therapy and stepdown ICU placement.  9/9: Currently on bipap this am with ongoing treatments. No acute events noted overnight. Palliative planning to have meeting with family today regarding goals of care.  Assessment & Plan:   Principal Problem:   Acute respiratory failure with hypoxia and hypercapnia (HCC) Active Problems:   COPD with acute exacerbation (HCC)   Atrial flutter (HCC)   Diabetes mellitus (HCC)   Aspiration pneumonia (HCC)   Dysphagia   Acute on chronic hypoxemic respiratory failure secondary to severe acute COPD exacerbation -Appreciate pulmonology recommendations with BiPAP weaning to nasal cannula today -Appreciate palliative care evaluation to clarify goals of care -Continue IV steroids -Breathing treatments to Xopenex  Aspiration pneumonia-recurrent -Continue IV Unasyn -SLP evaluation pending  Type 2 diabetes-poorly controlled -Hemoglobin A1c 8.5% -Noted to have labile blood glucose readings for which will decrease Lantus to 20 units  Atrial flutter -Currently rate controlled and in sinus rhythm -Continue Xarelto for anticoagulation -Lopressor IV for rate control -Change albuterol to Xopenex  Mild acute diastolic CHF exacerbation -Continue on Lasix IV 20 mg daily -Monitor daily weights and follow electrolytes  Hyperkalemia -Currently improved on Lasix -Continue to follow a.m. labs and hold potassium supplementation  Status post L1 compression fracture with vertebroplasty -Follow-up with PCP  Mild troponin elevation secondary to demand ischemia -No active chest pain noted  History of  dementia with sundowning -Continue home Seroquel   DVT prophylaxis:Xarelto Code Status: Full Family Communication: Will plan to call daughter [name] Disposition Plan: Continue current treatments on bipap. Palliative to discuss with family today. Appreciate Pulmonology recommendations.   Consultants:   Pulmonology  Palliative Care  Procedures:   None  Antimicrobials:   Unasyn 9/7->   Subjective: Patient seen and evaluated today with no new acute complaints or concerns. No acute concerns or events noted overnight. She remains on bipap this morning.  Objective: Vitals:   11/20/18 0400 11/20/18 0500 11/20/18 0559 11/20/18 0600  BP: 100/61 113/68 116/69 116/69  Pulse: 77 85 76 (!) 59  Resp: 18 15 16 17   Temp:      TempSrc:      SpO2: 100% 100% 100% 100%  Weight:      Height:        Intake/Output Summary (Last 24 hours) at 11/20/2018 0659 Last data filed at 11/20/2018 0615 Gross per 24 hour  Intake 1299.85 ml  Output 400 ml  Net 899.85 ml   Filed Weights   11/17/18 0819 11/18/18 0500 11/19/18 0602  Weight: 70.3 kg 69.3 kg 68.4 kg    Examination:  General exam: Appears calm and comfortable  Respiratory system: Clear to auscultation. Respiratory effort normal.  Currently on BiPAP FiO2 45% Cardiovascular system: S1 & S2 heard, RRR-SR. No JVD, murmurs, rubs, gallops or clicks.  Gastrointestinal system: Abdomen is nondistended, soft and nontender. No organomegaly or masses felt. Normal bowel sounds heard. Central nervous system: Arousable to voice. Extremities: SCDs bilaterally. Skin: No rashes, lesions or ulcers Psychiatry: Flat affect    Data Reviewed: I have personally reviewed following labs and imaging studies  CBC: Recent Labs  Lab 11/15/18 2330 11/16/18 0508 11/18/18 0434 11/19/18 0607 11/20/18 0418  WBC  12.8* 8.1 11.3* 10.9* 9.5  NEUTROABS 9.6*  --   --  9.8* 8.5*  HGB 10.6* 9.9* 9.7* 9.1* 9.4*  HCT 39.1 36.1 34.9* 32.7* 33.5*  MCV 101.8*  101.4* 98.3 97.9 98.8  PLT 414* 348 377 350 [id number]   Basic Metabolic Panel: Recent Labs  Lab 11/16/18 0508 11/16/18 1312 11/19/18 0607 11/19/18 1211 11/20/18 0418  NA 137 138 142 140 141  K 5.1 4.9 5.4* 5.2* 4.8  CL 89* 86* 93* 89* 91*  CO2 35* 36* 39* 36* 40*  GLUCOSE 193* 258* 219* 251* 196*  BUN 21 25* 28* 30* 25*  CREATININE 1.05* 1.12* 0.84 1.01* 0.82  CALCIUM 9.1 9.4 9.1 9.3 9.1  MG  --   --  1.9  --  2.0   GFR: Estimated Creatinine Clearance: 59.4 mL/min (by C-G formula based on SCr of 0.82 mg/dL). Liver Function Tests: Recent Labs  Lab 11/15/18 2330 11/16/18 0508 11/19/18 0607 11/20/18 0418  AST 34 24 14* 14*  ALT 31 26 20 22   ALKPHOS 90 74 62 61  BILITOT 0.3 0.6 0.6 0.8  PROT 7.1 6.3* 6.0* 6.0*  ALBUMIN 3.5 3.1* 3.1* 3.3*   No results for input(s): LIPASE, AMYLASE in the last 168 hours. No results for input(s): AMMONIA in the last 168 hours. Coagulation Profile: No results for input(s): INR, PROTIME in the last 168 hours. Cardiac Enzymes: No results for input(s): CKTOTAL, CKMB, CKMBINDEX, TROPONINI in the last 168 hours. BNP (last 3 results) No results for input(s): PROBNP in the last 8760 hours. HbA1C: No results for input(s): HGBA1C in the last 72 hours. CBG: Recent Labs  Lab 11/19/18 1638 11/19/18 1700 11/19/18 2000 11/19/18 2332 11/20/18 0403  GLUCAP 21* 103* 132* 208* 173*   Lipid Profile: No results for input(s): CHOL, HDL, LDLCALC, TRIG, CHOLHDL, LDLDIRECT in the last 72 hours. Thyroid Function Tests: No results for input(s): TSH, T4TOTAL, FREET4, T3FREE, THYROIDAB in the last 72 hours. Anemia Panel: No results for input(s): VITAMINB12, FOLATE, FERRITIN, TIBC, IRON, RETICCTPCT in the last 72 hours. Sepsis Labs: No results for input(s): PROCALCITON, LATICACIDVEN in the last 168 hours.  Recent Results (from the past 240 hour(s))  SARS Coronavirus 2 [facility] order, Performed in [facility] hospital lab)     Status: None   Collection Time:  11/15/18 11:10 PM  Result Value Ref Range Status   SARS Coronavirus 2 NEGATIVE NEGATIVE Final    Comment: (NOTE) If result is NEGATIVE SARS-CoV-2 target nucleic acids are NOT DETECTED. The SARS-CoV-2 RNA is generally detectable in upper and lower  respiratory specimens during the acute phase of infection. The lowest  concentration of SARS-CoV-2 viral copies this assay can detect is 250  copies / mL. A negative result does not preclude SARS-CoV-2 infection  and should not be used as the sole basis for treatment or other  patient management decisions.  A negative result may occur with  improper specimen collection / handling, submission of specimen other  than nasopharyngeal swab, presence of viral mutation(s) within the  areas targeted by this assay, and inadequate number of viral copies  (<250 copies / mL). A negative result must be combined with clinical  observations, patient history, and epidemiological information. If result is POSITIVE SARS-CoV-2 target nucleic acids are DETECTED. The SARS-CoV-2 RNA is generally detectable in upper and lower  respiratory specimens dur ing the acute phase of infection.  Positive  results are indicative of active infection with SARS-CoV-2.  Clinical  correlation with patient history and  other diagnostic information is  necessary to determine patient infection status.  Positive results do  not rule out bacterial infection or co-infection with other viruses. If result is PRESUMPTIVE POSTIVE SARS-CoV-2 nucleic acids MAY BE PRESENT.   A presumptive positive result was obtained on the submitted specimen  and confirmed on repeat testing.  While 2019 novel coronavirus  (SARS-CoV-2) nucleic acids may be present in the submitted sample  additional confirmatory testing may be necessary for epidemiological  and / or clinical management purposes  to differentiate between  SARS-CoV-2 and other Sarbecovirus currently known to infect humans.  If clinically  indicated additional testing with an alternate test  methodology [id number]) is advised. The SARS-CoV-2 RNA is generally  detectable in upper and lower respiratory sp ecimens during the acute  phase of infection. The expected result is Negative. Fact Sheet for Patients:  StrictlyIdeas.no Fact Sheet for Healthcare Providers: BankingDealers.co.za This test is not yet approved or cleared by the [place] FDA and has been authorized for detection and/or diagnosis of SARS-CoV-2 by FDA under an Emergency Use Authorization (EUA).  This EUA will remain in effect (meaning this test can be used) for the duration of the COVID-19 declaration under Section 564(b)(1) of the Act, 21 U.S.C. section 360bbb-3(b)(1), unless the authorization is terminated or revoked sooner. Performed at [facility], [address], [place], [place] [address]          Radiology Studies: Dg Chest [facility] 1 View  Result Date: 11/19/2018 CLINICAL DATA:  Acute respiratory failure with hypoxia. EXAM: PORTABLE CHEST 1 VIEW COMPARISON:  Radiograph of November 17, 2018. FINDINGS: Stable cardiomegaly. No pneumothorax is noted. Increased right basilar atelectasis or infiltrate is noted. Minimal left basilar subsegmental atelectasis is noted. Small pleural effusions may be present. Bony thorax is unremarkable. IMPRESSION: Increased right basilar opacity as described above. Electronically Signed   By: Marijo Conception M.D.   On: 11/19/2018 07:29        Scheduled Meds: . albuterol  2.5 mg Nebulization Q6H  . Chlorhexidine Gluconate Cloth  6 each Topical Daily  . fluticasone furoate-vilanterol  1 puff Inhalation Daily  . furosemide  40 mg Intravenous Daily  . insulin aspart  0-20 Units Subcutaneous Q4H  . insulin glargine  24 Units Subcutaneous Daily  . mouth rinse  15 mL Mouth Rinse BID  . methylPREDNISolone (SOLU-MEDROL) injection  80 mg Intravenous Q8H  . metoprolol tartrate   2.5 mg Intravenous Q6H  . QUEtiapine  25 mg Oral q1800  . rivaroxaban  20 mg Oral Q breakfast  . sodium chloride flush  3 mL Intravenous Q12H  . umeclidinium bromide  1 puff Inhalation Daily   Continuous Infusions: . sodium chloride 250 mL (11/19/18 0214)  . ampicillin-sulbactam (UNASYN) IV 3 g (11/20/18 0227)     LOS: 4 days    Time spent: 30 minutes    Jahquez Steffler Darleen Crocker, DO [facility] Pager [phone]  If 7PM-7AM, please contact night-coverage [email or web address] Password [email or web address] 11/20/2018, 6:59 AM

## 2018-11-20 NOTE — Progress Notes (Signed)
Pts medication scanner not working. Computer has been restarted several times with no success. IT ticket placed for tomorrow 11/20/18. Medication given with scanner broken comment.

## 2018-11-20 NOTE — Progress Notes (Signed)
Subjective: She continued clinical deterioration yesterday culminating in her going to stepdown and being placed on BiPAP.  She was on BiPAP overnight.  She does arouse.  She does not have any other new complaints.  Objective: Vital signs in last 24 hours: Temp:  [97.7 F (36.5 C)-98.5 F (36.9 C)] 98.5 F (36.9 C) (09/09 0000) Pulse Rate:  [37-110] 59 (09/09 0600) Resp:  [12-24] 17 (09/09 0600) BP: (92-123)/(55-81) 116/69 (09/09 0600) SpO2:  [70 %-100 %] 100 % (09/09 0600) FiO2 (%):  [65 %-100 %] 65 % (09/09 0125) Weight change:  Last BM Date: 11/17/18  Intake/Output from previous day: 09/08 0701 - 09/09 0700 In: 545 [P.O.:240; IV Piggyback:305] Out: 400 [Urine:400]  PHYSICAL EXAM General appearance: On BiPAP arousable. Resp: clear to auscultation bilaterally Cardio: regular rate and rhythm, S1, S2 normal, no murmur, click, rub or gallop GI: soft, non-tender; bowel sounds normal; no masses,  no organomegaly Extremities: extremities normal, atraumatic, no cyanosis or edema  Lab Results:  Results for orders placed or performed during the hospital encounter of 11/15/18 (from the past 48 hour(s))  Glucose, capillary     Status: Abnormal   Collection Time: 11/18/18 11:22 AM  Result Value Ref Range   Glucose-Capillary 232 (H) 70 - 99 mg/dL  Glucose, capillary     Status: Abnormal   Collection Time: 11/18/18  4:11 PM  Result Value Ref Range   Glucose-Capillary 256 (H) 70 - 99 mg/dL  Glucose, capillary     Status: Abnormal   Collection Time: 11/18/18 10:06 PM  Result Value Ref Range   Glucose-Capillary 278 (H) 70 - 99 mg/dL  Glucose, capillary     Status: Abnormal   Collection Time: 11/19/18  3:16 AM  Result Value Ref Range   Glucose-Capillary 195 (H) 70 - 99 mg/dL   Comment 1 Notify RN    Comment 2 Document in Chart   CBC with Differential/Platelet     Status: Abnormal   Collection Time: 11/19/18  6:07 AM  Result Value Ref Range   WBC 10.9 (H) 4.0 - 10.5 K/uL   RBC  3.34 (L) 3.87 - 5.11 MIL/uL   Hemoglobin 9.1 (L) 12.0 - 15.0 g/dL   HCT 32.7 (L) 36.0 - 46.0 %   MCV 97.9 80.0 - 100.0 fL   MCH 27.2 26.0 - 34.0 pg   MCHC 27.8 (L) 30.0 - 36.0 g/dL   RDW 14.1 11.5 - 15.5 %   Platelets 350 150 - 400 K/uL   nRBC 0.2 0.0 - 0.2 %   Neutrophils Relative % 90 %   Neutro Abs 9.8 (H) 1.7 - 7.7 K/uL   Lymphocytes Relative 4 %   Lymphs Abs 0.4 (L) 0.7 - 4.0 K/uL   Monocytes Relative 6 %   Monocytes Absolute 0.7 0.1 - 1.0 K/uL   Eosinophils Relative 0 %   Eosinophils Absolute 0.0 0.0 - 0.5 K/uL   Basophils Relative 0 %   Basophils Absolute 0.0 0.0 - 0.1 K/uL   Immature Granulocytes 0 %   Abs Immature Granulocytes 0.04 0.00 - 0.07 K/uL    Comment: Performed at [facility], [address], [place], [place] [address]  Comprehensive metabolic panel     Status: Abnormal   Collection Time: 11/19/18  6:07 AM  Result Value Ref Range   Sodium 142 135 - 145 mmol/L   Potassium 5.4 (H) 3.5 - 5.1 mmol/L   Chloride 93 (L) 98 - 111 mmol/L   CO2 39 (H) 22 - 32  mmol/L   Glucose, Bld 219 (H) 70 - 99 mg/dL   BUN 28 (H) 8 - 23 mg/dL   Creatinine, Ser 0.84 0.44 - 1.00 mg/dL   Calcium 9.1 8.9 - 10.3 mg/dL   Total Protein 6.0 (L) 6.5 - 8.1 g/dL   Albumin 3.1 (L) 3.5 - 5.0 g/dL   AST 14 (L) 15 - 41 U/L   ALT 20 0 - 44 U/L   Alkaline Phosphatase 62 38 - 126 U/L   Total Bilirubin 0.6 0.3 - 1.2 mg/dL   GFR calc non Af Amer >60 >60 mL/min   GFR calc Af Amer >60 >60 mL/min   Anion gap 10 5 - 15    Comment: Performed at [facility], [address] [place], [place] [address]  Magnesium     Status: None   Collection Time: 11/19/18  6:07 AM  Result Value Ref Range   Magnesium 1.9 1.7 - 2.4 mg/dL    Comment: Performed at [facility], [address] [place], [place] [address]  Glucose, capillary     Status: Abnormal   Collection Time: 11/19/18  7:54 AM  Result Value Ref Range   Glucose-Capillary 251 (H) 70 - 99 mg/dL  Blood gas, arterial     Status: Abnormal   Collection  Time: 11/19/18 11:08 AM  Result Value Ref Range   FIO2 80.00    pH, Arterial 7.471 (H) 7.350 - 7.450   pCO2 arterial 57.1 (H) 32.0 - 48.0 mmHg   pO2, Arterial <31.0 (LL) 83.0 - 108.0 mmHg    Comment: CRITICAL RESULT CALLED TO, READ BACK BY AND VERIFIED WITH: REYNOLDS,C AT 11:20AM ON 11/19/18 BY FESTERMAN,C    Bicarbonate 38.2 (H) 20.0 - 28.0 mmol/L   Acid-Base Excess 16.2 (H) 0.0 - 2.0 mmol/L   O2 Saturation 52.7 %   Patient temperature 37.0    Allens test (pass/fail) PASS PASS    Comment: Performed at [facility], [address] [place], [place] [address]  Glucose, capillary     Status: Abnormal   Collection Time: 11/19/18 11:57 AM  Result Value Ref Range   Glucose-Capillary 263 (H) 70 - 99 mg/dL  Basic metabolic panel     Status: Abnormal   Collection Time: 11/19/18 12:11 PM  Result Value Ref Range   Sodium 140 135 - 145 mmol/L   Potassium 5.2 (H) 3.5 - 5.1 mmol/L   Chloride 89 (L) 98 - 111 mmol/L   CO2 36 (H) 22 - 32 mmol/L   Glucose, Bld 251 (H) 70 - 99 mg/dL   BUN 30 (H) 8 - 23 mg/dL   Creatinine, Ser 1.01 (H) 0.44 - 1.00 mg/dL   Calcium 9.3 8.9 - 10.3 mg/dL   GFR calc non Af Amer 55 (L) >60 mL/min   GFR calc Af Amer >60 >60 mL/min   Anion gap 15 5 - 15    Comment: Performed at [facility], [address], [place], [place] [address]  Glucose, capillary     Status: Abnormal   Collection Time: 11/19/18  4:38 PM  Result Value Ref Range   Glucose-Capillary 21 (LL) 70 - 99 mg/dL   Comment 1 Notify RN   Glucose, capillary     Status: Abnormal   Collection Time: 11/19/18  5:00 PM  Result Value Ref Range   Glucose-Capillary 103 (H) 70 - 99 mg/dL  Glucose, capillary     Status: Abnormal   Collection Time: 11/19/18  8:00 PM  Result Value Ref Range  Glucose-Capillary 132 (H) 70 - 99 mg/dL   Comment 1 Notify RN    Comment 2 Document in Chart   Glucose, capillary     Status: Abnormal   Collection Time: 11/19/18 11:32 PM  Result Value Ref Range   Glucose-Capillary 208 (H)  70 - 99 mg/dL  Glucose, capillary     Status: Abnormal   Collection Time: 11/20/18  4:03 AM  Result Value Ref Range   Glucose-Capillary 173 (H) 70 - 99 mg/dL  CBC with Differential/Platelet     Status: Abnormal   Collection Time: 11/20/18  4:18 AM  Result Value Ref Range   WBC 9.5 4.0 - 10.5 K/uL   RBC 3.39 (L) 3.87 - 5.11 MIL/uL   Hemoglobin 9.4 (L) 12.0 - 15.0 g/dL   HCT 33.5 (L) 36.0 - 46.0 %   MCV 98.8 80.0 - 100.0 fL   MCH 27.7 26.0 - 34.0 pg   MCHC 28.1 (L) 30.0 - 36.0 g/dL   RDW 14.1 11.5 - 15.5 %   Platelets 366 150 - 400 K/uL   nRBC 0.0 0.0 - 0.2 %   Neutrophils Relative % 88 %   Neutro Abs 8.5 (H) 1.7 - 7.7 K/uL   Lymphocytes Relative 4 %   Lymphs Abs 0.3 (L) 0.7 - 4.0 K/uL   Monocytes Relative 7 %   Monocytes Absolute 0.6 0.1 - 1.0 K/uL   Eosinophils Relative 0 %   Eosinophils Absolute 0.0 0.0 - 0.5 K/uL   Basophils Relative 0 %   Basophils Absolute 0.0 0.0 - 0.1 K/uL   Immature Granulocytes 1 %   Abs Immature Granulocytes 0.06 0.00 - 0.07 K/uL    Comment: Performed at [facility], [address], [place], [place] [address]  Comprehensive metabolic panel     Status: Abnormal   Collection Time: 11/20/18  4:18 AM  Result Value Ref Range   Sodium 141 135 - 145 mmol/L   Potassium 4.8 3.5 - 5.1 mmol/L   Chloride 91 (L) 98 - 111 mmol/L   CO2 40 (H) 22 - 32 mmol/L   Glucose, Bld 196 (H) 70 - 99 mg/dL   BUN 25 (H) 8 - 23 mg/dL   Creatinine, Ser 0.82 0.44 - 1.00 mg/dL   Calcium 9.1 8.9 - 10.3 mg/dL   Total Protein 6.0 (L) 6.5 - 8.1 g/dL   Albumin 3.3 (L) 3.5 - 5.0 g/dL   AST 14 (L) 15 - 41 U/L   ALT 22 0 - 44 U/L   Alkaline Phosphatase 61 38 - 126 U/L   Total Bilirubin 0.8 0.3 - 1.2 mg/dL   GFR calc non Af Amer >60 >60 mL/min   GFR calc Af Amer >60 >60 mL/min   Anion gap 10 5 - 15    Comment: Performed at [facility], [address] [place], [place] [address]  Magnesium     Status: None   Collection Time: 11/20/18  4:18 AM  Result Value Ref Range   Magnesium  2.0 1.7 - 2.4 mg/dL    Comment: Performed at [facility], [address], [place], [place] [address]  Glucose, capillary     Status: Abnormal   Collection Time: 11/20/18  7:40 AM  Result Value Ref Range   Glucose-Capillary 213 (H) 70 - 99 mg/dL    ABGS Recent Labs    11/19/18 1108  PHART 7.471*  PO2ART <31.0*  HCO3 38.2*   CULTURES Recent Results (from the past 240 hour(s))  SARS Coronavirus 2 [facility]  order, Performed in [place] hospital lab)     Status: None   Collection Time: 11/15/18 11:10 PM  Result Value Ref Range Status   SARS Coronavirus 2 NEGATIVE NEGATIVE Final    Comment: (NOTE) If result is NEGATIVE SARS-CoV-2 target nucleic acids are NOT DETECTED. The SARS-CoV-2 RNA is generally detectable in upper and lower  respiratory specimens during the acute phase of infection. The lowest  concentration of SARS-CoV-2 viral copies this assay can detect is 250  copies / mL. A negative result does not preclude SARS-CoV-2 infection  and should not be used as the sole basis for treatment or other  patient management decisions.  A negative result may occur with  improper specimen collection / handling, submission of specimen other  than nasopharyngeal swab, presence of viral mutation(s) within the  areas targeted by this assay, and inadequate number of viral copies  (<250 copies / mL). A negative result must be combined with clinical  observations, patient history, and epidemiological information. If result is POSITIVE SARS-CoV-2 target nucleic acids are DETECTED. The SARS-CoV-2 RNA is generally detectable in upper and lower  respiratory specimens dur ing the acute phase of infection.  Positive  results are indicative of active infection with SARS-CoV-2.  Clinical  correlation with patient history and other diagnostic information is  necessary to determine patient infection status.  Positive results do  not rule out bacterial infection or co-infection with other  viruses. If result is PRESUMPTIVE POSTIVE SARS-CoV-2 nucleic acids MAY BE PRESENT.   A presumptive positive result was obtained on the submitted specimen  and confirmed on repeat testing.  While 2019 novel coronavirus  (SARS-CoV-2) nucleic acids may be present in the submitted sample  additional confirmatory testing may be necessary for epidemiological  and / or clinical management purposes  to differentiate between  SARS-CoV-2 and other Sarbecovirus currently known to infect humans.  If clinically indicated additional testing with an alternate test  methodology [phone]) is advised. The SARS-CoV-2 RNA is generally  detectable in upper and lower respiratory sp ecimens during the acute  phase of infection. The expected result is Negative. Fact Sheet for Patients:  StrictlyIdeas.no Fact Sheet for Healthcare Providers: BankingDealers.co.za This test is not yet approved or cleared by the [place] FDA and has been authorized for detection and/or diagnosis of SARS-CoV-2 by FDA under an Emergency Use Authorization (EUA).  This EUA will remain in effect (meaning this test can be used) for the duration of the COVID-19 declaration under Section 564(b)(1) of the Act, 21 U.S.C. section 360bbb-3(b)(1), unless the authorization is terminated or revoked sooner. Performed at [facility], [address] [place], [place] [address]    Studies/Results: Dg Chest Port 1 View  Result Date: 11/19/2018 CLINICAL DATA:  Acute respiratory failure with hypoxia. EXAM: PORTABLE CHEST 1 VIEW COMPARISON:  Radiograph of November 17, 2018. FINDINGS: Stable cardiomegaly. No pneumothorax is noted. Increased right basilar atelectasis or infiltrate is noted. Minimal left basilar subsegmental atelectasis is noted. Small pleural effusions may be present. Bony thorax is unremarkable. IMPRESSION: Increased right basilar opacity as described above. Electronically Signed   By:  Marijo Conception M.D.   On: 11/19/2018 07:29    Medications:  Prior to Admission:  Medications Prior to Admission  Medication Sig Dispense Refill Last Dose  . acetaminophen (TYLENOL) 500 MG tablet Take 500 mg by mouth every 8 (eight) hours as needed for mild pain or headache.    11/15/2018 at Unknown time  . albuterol (PROVENTIL) (2.5  MG/3ML) 0.083% nebulizer solution Take 2.5 mg by nebulization every 6 (six) hours as needed for wheezing or shortness of breath.   11/15/2018 at Unknown time  . furosemide (LASIX) 40 MG tablet Take 1.5 tablets (60 mg total) by mouth daily. 30 tablet  Past Week at Unknown time  . metFORMIN (GLUCOPHAGE-XR) 500 MG 24 hr tablet Take 500 mg by mouth daily with breakfast.   1 11/15/2018 at Unknown time  . metoprolol tartrate (LOPRESSOR) 50 MG tablet Take 50 mg by mouth 2 (two) times daily.   11/15/2018 at Unknown time  . polyethylene glycol (MIRALAX / GLYCOLAX) 17 g packet Take 17 g by mouth daily as needed for mild constipation. 28 each 0 unknown  . potassium chloride SA (K-DUR) 20 MEQ tablet Take 20 mEq by mouth 2 (two) times daily.   11/15/2018 at Unknown time  . PROAIR HFA 108 (90 BASE) MCG/ACT inhaler Inhale 2 puffs into the lungs every 6 (six) hours as needed for wheezing or shortness of breath.    unknown  . QUEtiapine (SEROQUEL) 25 MG tablet Take 1 tablet (25 mg total) by mouth daily at 6 PM. 30 tablet 3 11/15/2018 at Unknown time  . rivaroxaban (XARELTO) 20 MG TABS tablet Take 1 tablet (20 mg total) by mouth daily with supper. Stop Xarelto until Monday (08/26/18) in anticipation for kyphoplasty 21 tablet 0 11/15/2018 at 1800  . TRELEGY ELLIPTA 100-62.5-25 MCG/INH AEPB Take 1 puff by mouth daily.  0 11/15/2018 at Unknown time   Scheduled: . Chlorhexidine Gluconate Cloth  6 each Topical Daily  . fluticasone furoate-vilanterol  1 puff Inhalation Daily  . furosemide  40 mg Intravenous Daily  . insulin aspart  0-20 Units Subcutaneous Q4H  . insulin glargine  24 Units Subcutaneous  Daily  . levalbuterol  0.63 mg Nebulization Q6H  . mouth rinse  15 mL Mouth Rinse BID  . methylPREDNISolone (SOLU-MEDROL) injection  80 mg Intravenous Q8H  . metoprolol tartrate  2.5 mg Intravenous Q6H  . QUEtiapine  25 mg Oral q1800  . rivaroxaban  20 mg Oral Q breakfast  . sodium chloride flush  3 mL Intravenous Q12H  . umeclidinium bromide  1 puff Inhalation Daily   Continuous: . sodium chloride 250 mL (11/19/18 0214)  . ampicillin-sulbactam (UNASYN) IV 3 g (11/20/18 0756)   [id number] chloride, acetaminophen **OR** acetaminophen, HYDROcodone-acetaminophen, levalbuterol, LORazepam, polyethylene glycol, sodium chloride flush  Assesment: She was admitted with acute hypoxic and hypercapnic respiratory failure.  She has required BiPAP off and on during her hospitalization.  She is back in stepdown on BiPAP.  She has aspiration pneumonia which is being treated with Unasyn.  She has COPD with exacerbation and she is on steroids  She has had trouble with atrial flutter and Dr. Manuella Ghazi has switched her from albuterol to levalbuterol.  She is to have speech and palliative care evaluations again today. Principal Problem:   Acute respiratory failure with hypoxia and hypercapnia (HCC) Active Problems:   COPD with acute exacerbation (HCC)   Atrial flutter (HCC)   Diabetes mellitus ([place])   Aspiration pneumonia ([place])   Dysphagia    Plan: Continue treatments.  Okay to switch off of BiPAP to nasal cannula on clinical grounds.  Follow clinically and if she gets sleepy she will need to go back on BiPAP and have a blood gas.    LOS: 4 days   Alonza Bogus 11/20/2018, 7:56 AM

## 2018-11-20 NOTE — Evaluation (Signed)
Clinical/Bedside Swallow Evaluation Patient Details  Name: Eileen [name] MRN: [id number] Date of Birth: 03-25-1945  Today's Date: 11/20/2018 Time: SLP Start Time (ACUTE ONLY): 1201 SLP Stop Time (ACUTE ONLY): 1225 SLP Time Calculation (min) (ACUTE ONLY): 24 min  Past Medical History:  Past Medical History:  Diagnosis Date  . Asthma   . Atrial flutter ([place])   . Cancer [facility])    Right breast  . COPD (chronic obstructive pulmonary disease) ([place])   . Dementia ([place])   . History of breast cancer    right breast  . Hypertension   . On home O2   . Type 2 diabetes mellitus ([place])    Past Surgical History:  Past Surgical History:  Procedure Laterality Date  . BREAST SURGERY  2011   Right breast mastectomy  . CARDIOVERSION N/A 06/27/2018   Procedure: CARDIOVERSION;  Surgeon: Arnoldo Lenis, MD;  [facility];  Service: Endoscopy;  Laterality: N/A;  . CESAREAN SECTION    . COLONOSCOPY N/A 01/22/2018   Procedure: COLONOSCOPY;  Surgeon: Rogene Houston, MD;  Location: [facility];  Service: Endoscopy;  Laterality: N/A;  . HERNIA REPAIR     RIH  . IR VERTEBROPLASTY LUMBAR BX INC UNI/BIL INC/INJECT/IMAGING  09/05/2018  . MASTECTOMY  2011   right breast  . TEE WITHOUT CARDIOVERSION N/A 06/27/2018   Procedure: TRANSESOPHAGEAL ECHOCARDIOGRAM (TEE) WITH PROPOFOL;  Surgeon: Arnoldo Lenis, MD;  [facility]: [facility];  Service: Endoscopy;  Laterality: N/A;   HPI:  Eileen [name] is a 73 y.o. female with a multiple medical problems as documented below but most importantly COPD and CHF who presents the emergency department today with respiratory distress.  Patient, on review of records, is here often for respiratory failure with hypoxia and hypercarbia.  Symptoms altered with that as well.  Onset tonight was similar.  EMS got to her and her sats were low they gave her albuterol brought here for further evaluation.  No further history able to be obtained secondary to patient's  mental status. BSE requested.   Assessment / Plan / Recommendation Clinical Impression  Pt known to SLP service from BSE completed during last admission at the end of August (seen by different SLP). Clinical swallow evaluation completed at bedside. Pt without overt signs or symptoms of aspiration, however she is short of breath and has had several occurrences of pneumonia. Will proceed with MBSS tomorrow to objectively evaluate swallow. OK for D3/thin and pills whole in puree. Above to Pt and RN and in agreement with plan of care. MBSS planned for tomorrow.   SLP Visit Diagnosis: Dysphagia, unspecified (R13.10)    Aspiration Risk  Mild aspiration risk    Diet Recommendation Dysphagia 3 (Mech soft);Thin liquid   Liquid Administration via: Cup;Straw Medication Administration: Whole meds with liquid Supervision: Patient able to self feed;Intermittent supervision to cue for compensatory strategies Compensations: Slow rate;Small sips/bites Postural Changes: Seated upright at 90 degrees;Remain upright for at least 30 minutes after po intake    Other  Recommendations Oral Care Recommendations: Oral care BID;Staff/trained caregiver to provide oral care Other Recommendations: Clarify dietary restrictions   Follow up Recommendations None      Frequency and Duration min 2x/week  1 week       Prognosis Prognosis for Safe Diet Advancement: Good      Swallow Study   General Date of Onset: 11/15/18 HPI: Eileen [name] is a 73 y.o. female with a multiple medical problems as documented  below but most importantly COPD and CHF who presents the emergency department today with respiratory distress.  Patient, on review of records, is here often for respiratory failure with hypoxia and hypercarbia.  Symptoms altered with that as well.  Onset tonight was similar.  EMS got to her and her sats were low they gave her albuterol brought here for further evaluation.  No further history able to be obtained  secondary to patient's mental status. BSE requested. Type of Study: Bedside Swallow Evaluation Previous Swallow Assessment: BSE 11/08/18 reg/thin Diet Prior to this Study: Dysphagia 2 (chopped);Nectar-thick liquids Temperature Spikes Noted: No Respiratory Status: Nasal cannula History of Recent Intubation: No Behavior/Cognition: Alert;Cooperative;Confused Oral Cavity Assessment: Within Functional Limits Oral Care Completed by SLP: Recent completion by staff Oral Cavity - Dentition: Dentures, top;Dentures, bottom Vision: Functional for self-feeding Self-Feeding Abilities: Able to feed self Patient Positioning: Upright in bed Baseline Vocal Quality: Normal Volitional Cough: Strong Volitional Swallow: Able to elicit    Oral/Motor/Sensory Function Overall Oral Motor/Sensory Function: Within functional limits   Ice Chips Ice chips: Within functional limits Presentation: Spoon   Thin Liquid Thin Liquid: Within functional limits Presentation: Cup;Self Fed;Straw    Nectar Thick Nectar Thick Liquid: Within functional limits Presentation: Cup;Self Fed   Honey Thick Honey Thick Liquid: Not tested   Puree Puree: Within functional limits Presentation: Spoon   Solid     Solid: Within functional limits Presentation: Self Fed     Thank you,  [name], [name]  , 11/20/2018,12:31 PM

## 2018-11-21 ENCOUNTER — Inpatient Hospital Stay (HOSPITAL_COMMUNITY): Payer: Medicare Other

## 2018-11-21 LAB — COMPREHENSIVE METABOLIC PANEL
ALT: 20 U/L (ref 0–44)
AST: 12 U/L — ABNORMAL LOW (ref 15–41)
Albumin: 3.3 g/dL — ABNORMAL LOW (ref 3.5–5.0)
Alkaline Phosphatase: 67 U/L (ref 38–126)
Anion gap: 11 (ref 5–15)
BUN: 29 mg/dL — ABNORMAL HIGH (ref 8–23)
CO2: 40 mmol/L — ABNORMAL HIGH (ref 22–32)
Calcium: 9.2 mg/dL (ref 8.9–10.3)
Chloride: 90 mmol/L — ABNORMAL LOW (ref 98–111)
Creatinine, Ser: 0.81 mg/dL (ref 0.44–1.00)
GFR calc Af Amer: >60 mL/min (ref >60)
GFR calc non Af Amer: >60 mL/min (ref >60)
Glucose, Bld: 180 mg/dL — ABNORMAL HIGH (ref 70–99)
Potassium: 4.3 mmol/L (ref 3.5–5.1)
Sodium: 141 mmol/L (ref 135–145)
Total Bilirubin: 0.6 mg/dL (ref 0.3–1.2)
Total Protein: 6.1 g/dL — ABNORMAL LOW (ref 6.5–8.1)

## 2018-11-21 LAB — GLUCOSE, CAPILLARY
Glucose-Capillary: 175 mg/dL — ABNORMAL HIGH (ref 70–99)
Glucose-Capillary: 177 mg/dL — ABNORMAL HIGH (ref 70–99)
Glucose-Capillary: 184 mg/dL — ABNORMAL HIGH (ref 70–99)
Glucose-Capillary: 193 mg/dL — ABNORMAL HIGH (ref 70–99)
Glucose-Capillary: 224 mg/dL — ABNORMAL HIGH (ref 70–99)
Glucose-Capillary: 230 mg/dL — ABNORMAL HIGH (ref 70–99)
Glucose-Capillary: 263 mg/dL — ABNORMAL HIGH (ref 70–99)

## 2018-11-21 LAB — CBC WITH DIFFERENTIAL/PLATELET
Abs Immature Granulocytes: 0.07 10*3/uL (ref 0.00–0.07)
Basophils Absolute: 0 10*3/uL (ref 0.0–0.1)
Basophils Relative: 0 %
Eosinophils Absolute: 0 10*3/uL (ref 0.0–0.5)
Eosinophils Relative: 0 %
HCT: 37 % (ref 36.0–46.0)
Hemoglobin: 10.1 g/dL — ABNORMAL LOW (ref 12.0–15.0)
Immature Granulocytes: 1 %
Lymphocytes Relative: 4 %
Lymphs Abs: 0.5 10*3/uL — ABNORMAL LOW (ref 0.7–4.0)
MCH: 26.9 pg (ref 26.0–34.0)
MCHC: 27.3 g/dL — ABNORMAL LOW (ref 30.0–36.0)
MCV: 98.7 fL (ref 80.0–100.0)
Monocytes Absolute: 0.7 10*3/uL (ref 0.1–1.0)
Monocytes Relative: 6 %
Neutro Abs: 10.9 10*3/uL — ABNORMAL HIGH (ref 1.7–7.7)
Neutrophils Relative %: 89 %
Platelets: 394 10*3/uL (ref 150–400)
RBC: 3.75 MIL/uL — ABNORMAL LOW (ref 3.87–5.11)
RDW: 14.1 % (ref 11.5–15.5)
WBC: 12.1 10*3/uL — ABNORMAL HIGH (ref 4.0–10.5)
nRBC: 0.2 % (ref 0.0–0.2)

## 2018-11-21 LAB — MAGNESIUM: Magnesium: 2.1 mg/dL (ref 1.7–2.4)

## 2018-11-21 MED ORDER — BISACODYL 10 MG RE SUPP
10.0000 mg | Freq: Once | RECTAL | Status: AC
  Administered 2018-11-21: 17:00:00 10 mg via RECTAL
  Filled 2018-11-21: qty 1

## 2018-11-21 MED ORDER — INSULIN GLARGINE 100 UNIT/ML ~~LOC~~ SOLN
20.0000 [IU] | Freq: Every day | SUBCUTANEOUS | Status: DC
  Administered 2018-11-21 – 2018-11-22 (×2): 20 [IU] via SUBCUTANEOUS
  Filled 2018-11-21 (×5): qty 0.2

## 2018-11-21 MED ORDER — METHYLPREDNISOLONE SODIUM SUCC 40 MG IJ SOLR
40.0000 mg | Freq: Three times a day (TID) | INTRAMUSCULAR | Status: DC
  Administered 2018-11-21 – 2018-11-22 (×3): 40 mg via INTRAVENOUS
  Filled 2018-11-21 (×3): qty 1

## 2018-11-21 NOTE — Progress Notes (Signed)
PROGRESS NOTE    Eileen [name]  [id number] DOB: 09/29/1945 DOA: 11/15/2018 PCP: Lavella Lemons, PA   Brief Narrative:  73 y/o female with pulmonary hypertension, COPD, chronic respiratory failure on 3.5 L oxygen at baseline presented with severe hypoxia and acute on chronic respiratory failure with hypoxia and severe COPD exacerbation requiring BiPAP therapy and stepdown ICU placement.  9/9: Currently on bipap this am with ongoing treatments. No acute events noted overnight. Palliative planning to have meeting with family today regarding goals of care.  9/10: Patient has remained off BiPAP overnight with no acute events noted.  Plans are for barium swallow this morning.  Assessment & Plan:   Principal Problem:   Acute respiratory failure with hypoxia and hypercapnia (HCC) Active Problems:   COPD with acute exacerbation (HCC)   Atrial flutter (HCC)   Diabetes mellitus (HCC)   Aspiration pneumonia (HCC)   Dysphagia   Acute on chronic hypoxemic respiratory failure secondary to severe acute COPD exacerbation -Appreciate further pulmonology recommendations -Appreciate palliative care evaluation to clarify goals of care -Wean IV steroids -Breathing treatments to Xopenex as needed  Aspiration pneumonia-recurrent -Continue IV Unasyn -SLP evaluation pending with barium swallow today -Repeat CBC in a.m.  Type 2 diabetes-poorly controlled -Hemoglobin A1c 8.5% -Noted to have labile blood glucose readings for which will decrease Lantus to 20 units  Atrial flutter -Currently rate controlled and in sinus rhythm -Continue Xarelto for anticoagulation -Lopressor IV for rate control  Mild acute diastolic CHF exacerbation -Continue on Lasix IV 20 mg daily -Monitor daily weights and follow electrolytes -Negative fluid balance noted  Hyperkalemia-resolved -Currently improved on Lasix -Continue to follow a.m. labs and hold potassium supplementation for now  Status post  L1 compression fracture with vertebroplasty -Follow-up with PCP  Mild troponin elevation secondary to demand ischemia -No active chest pain noted  History of dementia with sundowning -Continue home Seroquel   DVT prophylaxis:Xarelto Code Status: Full Family Communication: Will plan to call daughter [name] today Disposition Plan:  Transfer to telemetry today.  Plans for barium swallow evaluation.  Taper steroids.  Anticipate discharge in the next 1 to 2 days with disposition pending further family discussion.   Consultants:   Pulmonology  Palliative Care  Procedures:   None  Antimicrobials:   Unasyn 9/7->  Subjective: Patient seen and evaluated today with no new acute complaints or concerns. No acute concerns or events noted overnight.  She has remained off BiPAP over the last 24 hours.  Heart rates are well controlled.  She denies any significant chest pain or shortness of breath.  Objective: Vitals:   11/21/18 0300 11/21/18 0400 11/21/18 0500 11/21/18 0600  BP: 109/66 130/89 109/68 136/70  Pulse: 66 79 74 (!) 105  Resp: (!) 22 (!) 24 (!) 23 17  Temp:  98.2 F (36.8 C)    TempSrc:  Oral    SpO2: 100% 94% 98% (!) 83%  Weight:   70.8 kg   Height:        Intake/Output Summary (Last 24 hours) at 11/21/2018 0652 Last data filed at 11/21/2018 0500 Gross per 24 hour  Intake 206.54 ml  Output 1150 ml  Net -943.46 ml   Filed Weights   11/18/18 0500 11/19/18 0602 11/21/18 0500  Weight: 69.3 kg 68.4 kg 70.8 kg    Examination:  General exam: Appears calm and comfortable  Respiratory system: Clear to auscultation. Respiratory effort normal.  Currently on 4 L nasal cannula. Cardiovascular system: S1 & S2 heard, RRR. No  JVD, murmurs, rubs, gallops or clicks. No pedal edema. Gastrointestinal system: Abdomen is nondistended, soft and nontender. No organomegaly or masses felt. Normal bowel sounds heard. Central nervous system: Alert and awake Extremities: SCDs  present bilaterally Skin: No rashes, lesions or ulcers Psychiatry: Flat affect    Data Reviewed: I have personally reviewed following labs and imaging studies  CBC: Recent Labs  Lab 11/15/18 2330 11/16/18 0508 11/18/18 0434 11/19/18 0607 11/20/18 0418 11/21/18 0353  WBC 12.8* 8.1 11.3* 10.9* 9.5 12.1*  NEUTROABS 9.6*  --   --  9.8* 8.5* 10.9*  HGB 10.6* 9.9* 9.7* 9.1* 9.4* 10.1*  HCT 39.1 36.1 34.9* 32.7* 33.5* 37.0  MCV 101.8* 101.4* 98.3 97.9 98.8 98.7  PLT 414* 348 377 350 366 [id number]   Basic Metabolic Panel: Recent Labs  Lab 11/16/18 1312 11/19/18 0607 11/19/18 1211 11/20/18 0418 11/21/18 0353  NA 138 142 140 141 141  K 4.9 5.4* 5.2* 4.8 4.3  CL 86* 93* 89* 91* 90*  CO2 36* 39* 36* 40* 40*  GLUCOSE 258* 219* 251* 196* 180*  BUN 25* 28* 30* 25* 29*  CREATININE 1.12* 0.84 1.01* 0.82 0.81  CALCIUM 9.4 9.1 9.3 9.1 9.2  MG  --  1.9  --  2.0 2.1   GFR: Estimated Creatinine Clearance: 60.2 mL/min (by C-G formula based on SCr of 0.81 mg/dL). Liver Function Tests: Recent Labs  Lab 11/15/18 2330 11/16/18 0508 11/19/18 0607 11/20/18 0418 11/21/18 0353  AST 34 24 14* 14* 12*  ALT 31 26 20 22 20   ALKPHOS 90 74 62 61 67  BILITOT 0.3 0.6 0.6 0.8 0.6  PROT 7.1 6.3* 6.0* 6.0* 6.1*  ALBUMIN 3.5 3.1* 3.1* 3.3* 3.3*   No results for input(s): LIPASE, AMYLASE in the last 168 hours. No results for input(s): AMMONIA in the last 168 hours. Coagulation Profile: No results for input(s): INR, PROTIME in the last 168 hours. Cardiac Enzymes: No results for input(s): CKTOTAL, CKMB, CKMBINDEX, TROPONINI in the last 168 hours. BNP (last 3 results) No results for input(s): PROBNP in the last 8760 hours. HbA1C: No results for input(s): HGBA1C in the last 72 hours. CBG: Recent Labs  Lab 11/20/18 1056 11/20/18 1627 11/20/18 1949 11/21/18 0041 11/21/18 0419  GLUCAP 357* 258* 230* 177* 193*   Lipid Profile: No results for input(s): CHOL, HDL, LDLCALC, TRIG, CHOLHDL,  LDLDIRECT in the last 72 hours. Thyroid Function Tests: No results for input(s): TSH, T4TOTAL, FREET4, T3FREE, THYROIDAB in the last 72 hours. Anemia Panel: No results for input(s): VITAMINB12, FOLATE, FERRITIN, TIBC, IRON, RETICCTPCT in the last 72 hours. Sepsis Labs: No results for input(s): PROCALCITON, LATICACIDVEN in the last 168 hours.  Recent Results (from the past 240 hour(s))  SARS Coronavirus 2 [facility] order, Performed in [facility] hospital lab)     Status: None   Collection Time: 11/15/18 11:10 PM  Result Value Ref Range Status   SARS Coronavirus 2 NEGATIVE NEGATIVE Final    Comment: (NOTE) If result is NEGATIVE SARS-CoV-2 target nucleic acids are NOT DETECTED. The SARS-CoV-2 RNA is generally detectable in upper and lower  respiratory specimens during the acute phase of infection. The lowest  concentration of SARS-CoV-2 viral copies this assay can detect is 250  copies / mL. A negative result does not preclude SARS-CoV-2 infection  and should not be used as the sole basis for treatment or other  patient management decisions.  A negative result may occur with  improper specimen collection / handling, submission of specimen other  than nasopharyngeal swab, presence of viral mutation(s) within the  areas targeted by this assay, and inadequate number of viral copies  (<250 copies / mL). A negative result must be combined with clinical  observations, patient history, and epidemiological information. If result is POSITIVE SARS-CoV-2 target nucleic acids are DETECTED. The SARS-CoV-2 RNA is generally detectable in upper and lower  respiratory specimens dur ing the acute phase of infection.  Positive  results are indicative of active infection with SARS-CoV-2.  Clinical  correlation with patient history and other diagnostic information is  necessary to determine patient infection status.  Positive results do  not rule out bacterial infection or co-infection with other  viruses. If result is PRESUMPTIVE POSTIVE SARS-CoV-2 nucleic acids MAY BE PRESENT.   A presumptive positive result was obtained on the submitted specimen  and confirmed on repeat testing.  While 2019 novel coronavirus  (SARS-CoV-2) nucleic acids may be present in the submitted sample  additional confirmatory testing may be necessary for epidemiological  and / or clinical management purposes  to differentiate between  SARS-CoV-2 and other Sarbecovirus currently known to infect humans.  If clinically indicated additional testing with an alternate test  methodology [phone]) is advised. The SARS-CoV-2 RNA is generally  detectable in upper and lower respiratory sp ecimens during the acute  phase of infection. The expected result is Negative. Fact Sheet for Patients:  StrictlyIdeas.no Fact Sheet for Healthcare Providers: BankingDealers.co.za This test is not yet approved or cleared by the [place] FDA and has been authorized for detection and/or diagnosis of SARS-CoV-2 by FDA under an Emergency Use Authorization (EUA).  This EUA will remain in effect (meaning this test can be used) for the duration of the COVID-19 declaration under Section 564(b)(1) of the Act, 21 U.S.C. section 360bbb-3(b)(1), unless the authorization is terminated or revoked sooner. Performed at [facility], [address], [place], [place] [address]          Radiology Studies: No results found.      Scheduled Meds: . Chlorhexidine Gluconate Cloth  6 each Topical Daily  . fluticasone furoate-vilanterol  1 puff Inhalation Daily  . furosemide  40 mg Intravenous Daily  . insulin aspart  0-20 Units Subcutaneous Q4H  . insulin glargine  20 Units Subcutaneous Daily  . levalbuterol  0.63 mg Nebulization Q6H  . mouth rinse  15 mL Mouth Rinse BID  . methylPREDNISolone (SOLU-MEDROL) injection  80 mg Intravenous Q8H  . metoprolol tartrate  2.5 mg Intravenous Q6H   . QUEtiapine  25 mg Oral q1800  . rivaroxaban  20 mg Oral Q breakfast  . sodium chloride flush  3 mL Intravenous Q12H  . umeclidinium bromide  1 puff Inhalation Daily   Continuous Infusions: . sodium chloride Stopped (11/20/18 0417)  . ampicillin-sulbactam (UNASYN) IV 3 g (11/21/18 0248)     LOS: 5 days    Time spent: 30 minutes    Pratik Darleen Crocker, DO [facility] Pager [phone]  If 7PM-7AM, please contact night-coverage [email or web address] Password [facility] 11/21/2018, 6:52 AM

## 2018-11-21 NOTE — Progress Notes (Signed)
Subjective: She is doing better.  She was admitted with acute on chronic hypoxic and hypercapnic respiratory failure and she is required BiPAP.  She had an episode of observed aspiration and initially did okay after that later had more problem with her breathing and had to come back down to stepdown unit for BiPAP.  This morning she is awake and alert and looks at about her baseline.  Objective: Vital signs in last 24 hours: Temp:  [97.7 F (36.5 C)-98.5 F (36.9 C)] 98.2 F (36.8 C) (09/10 0400) Pulse Rate:  [66-105] 105 (09/10 0600) Resp:  [13-25] 17 (09/10 0600) BP: (109-136)/(58-89) 136/70 (09/10 0600) SpO2:  [83 %-100 %] 83 % (09/10 0600) Weight:  [70.8 kg] 70.8 kg (09/10 0500) Weight change:  Last BM Date: 11/17/18  Intake/Output from previous day: 09/09 0701 - 09/10 0700 In: 407.2 [I.V.:3.1; IV Piggyback:404.1] Out: 1150 [Urine:1150]  PHYSICAL EXAM General appearance: alert, cooperative and no distress Resp: rhonchi bilaterally Cardio: Her heart is mildly irregular no gallop GI: soft, non-tender; bowel sounds normal; no masses,  no organomegaly Extremities: extremities normal, atraumatic, no cyanosis or edema  Lab Results:  Results for orders placed or performed during the hospital encounter of 11/15/18 (from the past 48 hour(s))  Glucose, capillary     Status: Abnormal   Collection Time: 11/19/18  7:54 AM  Result Value Ref Range   Glucose-Capillary 251 (H) 70 - 99 mg/dL  Blood gas, arterial     Status: Abnormal   Collection Time: 11/19/18 11:08 AM  Result Value Ref Range   FIO2 80.00    pH, Arterial 7.471 (H) 7.350 - 7.450   pCO2 arterial 57.1 (H) 32.0 - 48.0 mmHg   pO2, Arterial <31.0 (LL) 83.0 - 108.0 mmHg    Comment: CRITICAL RESULT CALLED TO, READ BACK BY AND VERIFIED WITH: REYNOLDS,C AT 11:20AM ON 11/19/18 BY FESTERMAN,C    Bicarbonate 38.2 (H) 20.0 - 28.0 mmol/L   Acid-Base Excess 16.2 (H) 0.0 - 2.0 mmol/L   O2 Saturation 52.7 %   Patient temperature  37.0    Allens test (pass/fail) PASS PASS    Comment: Performed at [facility], [address], [place], [place] [address]  Glucose, capillary     Status: Abnormal   Collection Time: 11/19/18 11:57 AM  Result Value Ref Range   Glucose-Capillary 263 (H) 70 - 99 mg/dL  Basic metabolic panel     Status: Abnormal   Collection Time: 11/19/18 12:11 PM  Result Value Ref Range   Sodium 140 135 - 145 mmol/L   Potassium 5.2 (H) 3.5 - 5.1 mmol/L   Chloride 89 (L) 98 - 111 mmol/L   CO2 36 (H) 22 - 32 mmol/L   Glucose, Bld 251 (H) 70 - 99 mg/dL   BUN 30 (H) 8 - 23 mg/dL   Creatinine, Ser 1.01 (H) 0.44 - 1.00 mg/dL   Calcium 9.3 8.9 - 10.3 mg/dL   GFR calc non Af Amer 55 (L) >60 mL/min   GFR calc Af Amer >60 >60 mL/min   Anion gap 15 5 - 15    Comment: Performed at [facility], [address] [place], [place] [address]  Glucose, capillary     Status: Abnormal   Collection Time: 11/19/18  4:38 PM  Result Value Ref Range   Glucose-Capillary 21 (LL) 70 - 99 mg/dL   Comment 1 Notify RN   Glucose, capillary     Status: Abnormal   Collection Time: 11/19/18  5:00 PM  Result  Value Ref Range   Glucose-Capillary 103 (H) 70 - 99 mg/dL  Glucose, capillary     Status: Abnormal   Collection Time: 11/19/18  8:00 PM  Result Value Ref Range   Glucose-Capillary 132 (H) 70 - 99 mg/dL   Comment 1 Notify RN    Comment 2 Document in Chart   Glucose, capillary     Status: Abnormal   Collection Time: 11/19/18 11:32 PM  Result Value Ref Range   Glucose-Capillary 208 (H) 70 - 99 mg/dL  Glucose, capillary     Status: Abnormal   Collection Time: 11/20/18  4:03 AM  Result Value Ref Range   Glucose-Capillary 173 (H) 70 - 99 mg/dL  CBC with Differential/Platelet     Status: Abnormal   Collection Time: 11/20/18  4:18 AM  Result Value Ref Range   WBC 9.5 4.0 - 10.5 K/uL   RBC 3.39 (L) 3.87 - 5.11 MIL/uL   Hemoglobin 9.4 (L) 12.0 - 15.0 g/dL   HCT 33.5 (L) 36.0 - 46.0 %   MCV 98.8 80.0 - 100.0 fL   MCH 27.7  26.0 - 34.0 pg   MCHC 28.1 (L) 30.0 - 36.0 g/dL   RDW 14.1 11.5 - 15.5 %   Platelets 366 150 - 400 K/uL   nRBC 0.0 0.0 - 0.2 %   Neutrophils Relative % 88 %   Neutro Abs 8.5 (H) 1.7 - 7.7 K/uL   Lymphocytes Relative 4 %   Lymphs Abs 0.3 (L) 0.7 - 4.0 K/uL   Monocytes Relative 7 %   Monocytes Absolute 0.6 0.1 - 1.0 K/uL   Eosinophils Relative 0 %   Eosinophils Absolute 0.0 0.0 - 0.5 K/uL   Basophils Relative 0 %   Basophils Absolute 0.0 0.0 - 0.1 K/uL   Immature Granulocytes 1 %   Abs Immature Granulocytes 0.06 0.00 - 0.07 K/uL    Comment: Performed at [facility], [address] [place], [place] [address]  Comprehensive metabolic panel     Status: Abnormal   Collection Time: 11/20/18  4:18 AM  Result Value Ref Range   Sodium 141 135 - 145 mmol/L   Potassium 4.8 3.5 - 5.1 mmol/L   Chloride 91 (L) 98 - 111 mmol/L   CO2 40 (H) 22 - 32 mmol/L   Glucose, Bld 196 (H) 70 - 99 mg/dL   BUN 25 (H) 8 - 23 mg/dL   Creatinine, Ser 0.82 0.44 - 1.00 mg/dL   Calcium 9.1 8.9 - 10.3 mg/dL   Total Protein 6.0 (L) 6.5 - 8.1 g/dL   Albumin 3.3 (L) 3.5 - 5.0 g/dL   AST 14 (L) 15 - 41 U/L   ALT 22 0 - 44 U/L   Alkaline Phosphatase 61 38 - 126 U/L   Total Bilirubin 0.8 0.3 - 1.2 mg/dL   GFR calc non Af Amer >60 >60 mL/min   GFR calc Af Amer >60 >60 mL/min   Anion gap 10 5 - 15    Comment: Performed at [facility], [address], [place], [place] [address]  Magnesium     Status: None   Collection Time: 11/20/18  4:18 AM  Result Value Ref Range   Magnesium 2.0 1.7 - 2.4 mg/dL    Comment: Performed at [facility], [address], [place], [place] [address]  Glucose, capillary     Status: Abnormal   Collection Time: 11/20/18  7:40 AM  Result Value Ref Range   Glucose-Capillary 213 (H) 70 - 99 mg/dL  Glucose, capillary     Status: Abnormal   Collection Time: 11/20/18 10:56 AM  Result Value Ref Range   Glucose-Capillary 357 (H) 70 - 99 mg/dL  Glucose, capillary     Status: Abnormal    Collection Time: 11/20/18  4:27 PM  Result Value Ref Range   Glucose-Capillary 258 (H) 70 - 99 mg/dL  Glucose, capillary     Status: Abnormal   Collection Time: 11/20/18  7:49 PM  Result Value Ref Range   Glucose-Capillary 230 (H) 70 - 99 mg/dL  Glucose, capillary     Status: Abnormal   Collection Time: 11/21/18 12:41 AM  Result Value Ref Range   Glucose-Capillary 177 (H) 70 - 99 mg/dL  CBC with Differential/Platelet     Status: Abnormal   Collection Time: 11/21/18  3:53 AM  Result Value Ref Range   WBC 12.1 (H) 4.0 - 10.5 K/uL   RBC 3.75 (L) 3.87 - 5.11 MIL/uL   Hemoglobin 10.1 (L) 12.0 - 15.0 g/dL   HCT 37.0 36.0 - 46.0 %   MCV 98.7 80.0 - 100.0 fL   MCH 26.9 26.0 - 34.0 pg   MCHC 27.3 (L) 30.0 - 36.0 g/dL   RDW 14.1 11.5 - 15.5 %   Platelets 394 150 - 400 K/uL   nRBC 0.2 0.0 - 0.2 %   Neutrophils Relative % 89 %   Neutro Abs 10.9 (H) 1.7 - 7.7 K/uL   Lymphocytes Relative 4 %   Lymphs Abs 0.5 (L) 0.7 - 4.0 K/uL   Monocytes Relative 6 %   Monocytes Absolute 0.7 0.1 - 1.0 K/uL   Eosinophils Relative 0 %   Eosinophils Absolute 0.0 0.0 - 0.5 K/uL   Basophils Relative 0 %   Basophils Absolute 0.0 0.0 - 0.1 K/uL   Immature Granulocytes 1 %   Abs Immature Granulocytes 0.07 0.00 - 0.07 K/uL    Comment: Performed at [facility], [address] [place], [place] [address]  Comprehensive metabolic panel     Status: Abnormal   Collection Time: 11/21/18  3:53 AM  Result Value Ref Range   Sodium 141 135 - 145 mmol/L   Potassium 4.3 3.5 - 5.1 mmol/L   Chloride 90 (L) 98 - 111 mmol/L   CO2 40 (H) 22 - 32 mmol/L   Glucose, Bld 180 (H) 70 - 99 mg/dL   BUN 29 (H) 8 - 23 mg/dL   Creatinine, Ser 0.81 0.44 - 1.00 mg/dL   Calcium 9.2 8.9 - 10.3 mg/dL   Total Protein 6.1 (L) 6.5 - 8.1 g/dL   Albumin 3.3 (L) 3.5 - 5.0 g/dL   AST 12 (L) 15 - 41 U/L   ALT 20 0 - 44 U/L   Alkaline Phosphatase 67 38 - 126 U/L   Total Bilirubin 0.6 0.3 - 1.2 mg/dL   GFR calc non Af Amer >60 >60 mL/min    GFR calc Af Amer >60 >60 mL/min   Anion gap 11 5 - 15    Comment: Performed at [facility], [address] [place], [place] [address]  Magnesium     Status: None   Collection Time: 11/21/18  3:53 AM  Result Value Ref Range   Magnesium 2.1 1.7 - 2.4 mg/dL    Comment: Performed at [facility], [address], [place], [place] [address]  Glucose, capillary     Status: Abnormal   Collection Time: 11/21/18  4:19 AM  Result Value Ref Range   Glucose-Capillary 193 (H) 70 -  99 mg/dL    ABGS Recent Labs    11/19/18 1108  PHART 7.471*  PO2ART <31.0*  HCO3 38.2*   CULTURES Recent Results (from the past 240 hour(s))  SARS Coronavirus 2 [facility] order, Performed in [place] hospital lab)     Status: None   Collection Time: 11/15/18 11:10 PM  Result Value Ref Range Status   SARS Coronavirus 2 NEGATIVE NEGATIVE Final    Comment: (NOTE) If result is NEGATIVE SARS-CoV-2 target nucleic acids are NOT DETECTED. The SARS-CoV-2 RNA is generally detectable in upper and lower  respiratory specimens during the acute phase of infection. The lowest  concentration of SARS-CoV-2 viral copies this assay can detect is 250  copies / mL. A negative result does not preclude SARS-CoV-2 infection  and should not be used as the sole basis for treatment or other  patient management decisions.  A negative result may occur with  improper specimen collection / handling, submission of specimen other  than nasopharyngeal swab, presence of viral mutation(s) within the  areas targeted by this assay, and inadequate number of viral copies  (<250 copies / mL). A negative result must be combined with clinical  observations, patient history, and epidemiological information. If result is POSITIVE SARS-CoV-2 target nucleic acids are DETECTED. The SARS-CoV-2 RNA is generally detectable in upper and lower  respiratory specimens dur ing the acute phase of infection.  Positive  results are indicative of active infection  with SARS-CoV-2.  Clinical  correlation with patient history and other diagnostic information is  necessary to determine patient infection status.  Positive results do  not rule out bacterial infection or co-infection with other viruses. If result is PRESUMPTIVE POSTIVE SARS-CoV-2 nucleic acids MAY BE PRESENT.   A presumptive positive result was obtained on the submitted specimen  and confirmed on repeat testing.  While 2019 novel coronavirus  (SARS-CoV-2) nucleic acids may be present in the submitted sample  additional confirmatory testing may be necessary for epidemiological  and / or clinical management purposes  to differentiate between  SARS-CoV-2 and other Sarbecovirus currently known to infect humans.  If clinically indicated additional testing with an alternate test  methodology [id number]) is advised. The SARS-CoV-2 RNA is generally  detectable in upper and lower respiratory sp ecimens during the acute  phase of infection. The expected result is Negative. Fact Sheet for Patients:  StrictlyIdeas.no Fact Sheet for Healthcare Providers: BankingDealers.co.za This test is not yet approved or cleared by the [place] FDA and has been authorized for detection and/or diagnosis of SARS-CoV-2 by FDA under an Emergency Use Authorization (EUA).  This EUA will remain in effect (meaning this test can be used) for the duration of the COVID-19 declaration under Section 564(b)(1) of the Act, 21 U.S.C. section 360bbb-3(b)(1), unless the authorization is terminated or revoked sooner. Performed at [facility], [address] [place], [place] [address]    Studies/Results: No results found.  Medications:  Prior to Admission:  Medications Prior to Admission  Medication Sig Dispense Refill Last Dose  . acetaminophen (TYLENOL) 500 MG tablet Take 500 mg by mouth every 8 (eight) hours as needed for mild pain or headache.    11/15/2018 at Unknown time   . albuterol (PROVENTIL) (2.5 MG/3ML) 0.083% nebulizer solution Take 2.5 mg by nebulization every 6 (six) hours as needed for wheezing or shortness of breath.   11/15/2018 at Unknown time  . furosemide (LASIX) 40 MG tablet Take 1.5 tablets (60 mg total) by mouth daily. 30 tablet  Past Week at Unknown time  . metFORMIN (GLUCOPHAGE-XR) 500 MG 24 hr tablet Take 500 mg by mouth daily with breakfast.   1 11/15/2018 at Unknown time  . metoprolol tartrate (LOPRESSOR) 50 MG tablet Take 50 mg by mouth 2 (two) times daily.   11/15/2018 at Unknown time  . polyethylene glycol (MIRALAX / GLYCOLAX) 17 g packet Take 17 g by mouth daily as needed for mild constipation. 28 each 0 unknown  . potassium chloride SA (K-DUR) 20 MEQ tablet Take 20 mEq by mouth 2 (two) times daily.   11/15/2018 at Unknown time  . PROAIR HFA 108 (90 BASE) MCG/ACT inhaler Inhale 2 puffs into the lungs every 6 (six) hours as needed for wheezing or shortness of breath.    unknown  . QUEtiapine (SEROQUEL) 25 MG tablet Take 1 tablet (25 mg total) by mouth daily at 6 PM. 30 tablet 3 11/15/2018 at Unknown time  . rivaroxaban (XARELTO) 20 MG TABS tablet Take 1 tablet (20 mg total) by mouth daily with supper. Stop Xarelto until Monday (08/26/18) in anticipation for kyphoplasty 21 tablet 0 11/15/2018 at 1800  . TRELEGY ELLIPTA 100-62.5-25 MCG/INH AEPB Take 1 puff by mouth daily.  0 11/15/2018 at Unknown time   Scheduled: . Chlorhexidine Gluconate Cloth  6 each Topical Daily  . fluticasone furoate-vilanterol  1 puff Inhalation Daily  . furosemide  40 mg Intravenous Daily  . insulin aspart  0-20 Units Subcutaneous Q4H  . insulin glargine  20 Units Subcutaneous Daily  . mouth rinse  15 mL Mouth Rinse BID  . methylPREDNISolone (SOLU-MEDROL) injection  40 mg Intravenous Q8H  . metoprolol tartrate  2.5 mg Intravenous Q6H  . QUEtiapine  25 mg Oral q1800  . rivaroxaban  20 mg Oral Q breakfast  . sodium chloride flush  3 mL Intravenous Q12H  . umeclidinium bromide  1  puff Inhalation Daily   Continuous: . sodium chloride Stopped (11/20/18 0417)  . ampicillin-sulbactam (UNASYN) IV Stopped (11/21/18 0517)   [id number] chloride, acetaminophen **OR** acetaminophen, HYDROcodone-acetaminophen, levalbuterol, LORazepam, polyethylene glycol, sodium chloride flush  Assesment: She was admitted with acute hypoxic and hypercapnic respiratory failure and she is much improved.  She had an episode of observed aspiration and has aspiration pneumonia which is being treated.  I think that may be part of her baseline problem that gets her back in the hospital frequently  She has severe COPD at baseline being treated with bronchodilators and prednisone  She has atrial flutter and has some minimal irregularity of her heart  She has dementia with sundowning and she does okay as long as she gets her medication around 5:00 Principal Problem:   Acute respiratory failure with hypoxia and hypercapnia ([place]) Active Problems:   COPD with acute exacerbation ([place])   Atrial flutter ([place])   Diabetes mellitus ([place])   Aspiration pneumonia ([place])   Dysphagia    Plan: Plan is for her to have barium swallow today.  Transfer out of ICU.  Possible discharge tomorrow.  I will follow more peripherally.  Discussed with Dr. Manuella Ghazi    LOS: 5 days   Eileen [name] 11/21/2018, 7:37 AM

## 2018-11-21 NOTE — Progress Notes (Signed)
Inpatient Diabetes Program Recommendations  AACE/ADA: New Consensus Statement on Inpatient Glycemic Control   Target Ranges:  Prepandial:   less than 140 mg/dL      Peak postprandial:   less than 180 mg/dL (1-2 hours)      Critically ill patients:  140 - 180 mg/dL   Results for [place], [name] (MRN [id number]) as of 11/21/2018 16:33  Ref. Range 11/20/2018 07:40 11/20/2018 10:56 11/20/2018 16:27 11/20/2018 19:49 11/21/2018 00:41 11/21/2018 04:19 11/21/2018 07:40 11/21/2018 11:44 11/21/2018 15:36  Glucose-Capillary Latest Ref Range: 70 - 99 mg/dL 213 (H) 357 (H) 258 (H) 230 (H) 177 (H) 193 (H) 175 (H) 263 (H) 224 (H)   Review of Glycemic Control  Diabetes history: DM2 Outpatient Diabetes medications: Metformin XR 500 mg daily Current orders for Inpatient glycemic control: Novolog 0-20 units Q4H, Lantus 20 units daily  Inpatient Diabetes Program Recommendations:   Insulin-Basal: If Solumedrol is continued, please consider increasing Lantus to 24 units daily.  Insulin-Meal Coverage: If Solumedrol is continued, please consider ordering Novolog 4 units TID with meals for meal coverage if patient eats at least 50% of meals.  Thanks, Barnie Alderman, RN, MSN, CDE Diabetes Coordinator Inpatient Diabetes Program [phone] (Team Pager from 8am to 5pm)

## 2018-11-21 NOTE — Progress Notes (Signed)
Palliative:  HPI: 73 y.o.femalewith past medical history of dementia, severe COPD, recurrent pneumonia (aspiration?), atrial flutter, HTN, mod mitral regurgitation, mild-mod pulmonary hypertension, iron deficiency anemia, diabetes, diverticulosis, h/o R breast cancer in remission, h/o L1 compression fracture s/p kyphoplasty, 8 admissions over past 6 months mostly d/t COPD and pneumoniaadmitted on 9/4/2020with respiratory distress and decreased oxygen saturations with COPD exacerbation initially requiring BiPAP.Concern for aspiration event 9/7 and now with infiltrates in right lung base.Required return to ICU for BiPAP 9/8 but now titrated back to 4L 9/9. Off BiPAP and transfer out of ICU 9/10.   I met today with Eileen [name]. She continues to be successful on nasal cannula and she feels much better but is tired. Her abd is very distended and she has not had a BM in a few days. She is a little uncomfortable with distention. She has received Miralax and we discussed using dulcolax suppository this evening if she has had no BM and she agrees with this plan. I worry with the impact of distention on her breathing if this worsens.   She continue to be indecisive on her wishes and GOC. She does tell me that she looks forward to celebrating her birthday [birth year]. She denies any other complaints but is tired this morning. She is hoping to return home soon. Plans to move out of ICU today.   I did meet briefly with daughter, [name], as she was on her way out of the hospital from visiting. They continue to dance around any decisions but I believe this is because they desire continued aggressive care. She did tell me in our first conversation that she wished for full code and full aggressive care. They feel that the BiPAP always works for Eileen [name] and are hopeful this will continue. I gave [employer] booklet. She agrees to outpatient palliative to follow if available.   Exam: Alert, oriented. Fatigued.  Breathing regular, unlabored. Abd distended. Generalized weakness. Good spirits.   Plan: - Continue BiPAP as indicated. - Continue Seroquel as scheduled.  - Home with outpatient palliative likely over the next couple days.   25 min  Vinie Sill, NP Palliative Medicine Team Pager [phone] (Please see [email or web address] for schedule) Team Phone [phone]    Greater than 50%  of this time was spent counseling and coordinating care related to the above assessment and plan

## 2018-11-21 NOTE — Progress Notes (Signed)
Modified Barium Swallow Progress Note  Patient Details  Name: Eileen [name] MRN: [id number] Date of Birth: 30-Nov-1945  Today's Date: 11/21/2018  Modified Barium Swallow completed.  Full report located under Chart Review in the Imaging Section.  Brief recommendations include the following:  Clinical Impression  Pt presents with    Swallow Evaluation R Pt presents with min oropharyngeal dysphagia characterized by min premature spillage of liquids to the valleculae and spilling to the pyriforms with occasional flash penetration without aspiration except for one episode of overt aspiration before the swallow when taking straw sips of thin liquids to due premature spillage and suspected breath inhalation. Aspiration was silent and was not removed despite SLP prompts for coughing. Aspiration of thins with a straw was mitigated through use of chin tuck and small sips. Recommend regular textures and thin liquids, NO STRAWS, swallow 2x for each swallow and clear throat periodically. Pt appreciative for recommendations and verbalized that she will take small cup sips of liquids. SLP will follow for diet tolerance x1, however no SLP f/u post acute necessary at this time.     SLP Diet Recommendations: Dysphagia 3 (Mech soft) solids;Thin liquid   Liquid Administration via: Cup;No straw   Medication Administration: Whole meds with puree   Supervision: Patient able to self feed   Compensations: Slow rate;Small sips/bites   Postural Changes: Remain semi-upright after after feeds/meals (Comment);Seated upright at 90 degrees   Oral Care Recommendations: Oral care BID;Staff/trained caregiver to provide oral care   Other Recommendations: Clarify dietary restrictions   Thank you,  [name], [place] 11/21/2018,2:00 PM

## 2018-11-21 NOTE — Progress Notes (Signed)
BIPAP ordered as needed. Still at bedside.

## 2018-11-22 LAB — COMPREHENSIVE METABOLIC PANEL WITH GFR
ALT: 18 U/L (ref 0–44)
AST: 16 U/L (ref 15–41)
Albumin: 3 g/dL — ABNORMAL LOW (ref 3.5–5.0)
Alkaline Phosphatase: 66 U/L (ref 38–126)
Anion gap: 12 (ref 5–15)
BUN: 27 mg/dL — ABNORMAL HIGH (ref 8–23)
CO2: 38 mmol/L — ABNORMAL HIGH (ref 22–32)
Calcium: 9 mg/dL (ref 8.9–10.3)
Chloride: 89 mmol/L — ABNORMAL LOW (ref 98–111)
Creatinine, Ser: 0.72 mg/dL (ref 0.44–1.00)
GFR calc Af Amer: >60 mL/min (ref >60)
GFR calc non Af Amer: >60 mL/min (ref >60)
Glucose, Bld: 162 mg/dL — ABNORMAL HIGH (ref 70–99)
Potassium: 4.3 mmol/L (ref 3.5–5.1)
Sodium: 139 mmol/L (ref 135–145)
Total Bilirubin: 0.4 mg/dL (ref 0.3–1.2)
Total Protein: 5.8 g/dL — ABNORMAL LOW (ref 6.5–8.1)

## 2018-11-22 LAB — GLUCOSE, CAPILLARY
Glucose-Capillary: 129 mg/dL — ABNORMAL HIGH (ref 70–99)
Glucose-Capillary: 148 mg/dL — ABNORMAL HIGH (ref 70–99)
Glucose-Capillary: 254 mg/dL — ABNORMAL HIGH (ref 70–99)
Glucose-Capillary: 67 mg/dL — ABNORMAL LOW (ref 70–99)
Glucose-Capillary: 96 mg/dL (ref 70–99)

## 2018-11-22 LAB — CBC WITH DIFFERENTIAL/PLATELET
Abs Immature Granulocytes: 0.11 10*3/uL — ABNORMAL HIGH (ref 0.00–0.07)
Basophils Absolute: 0 10*3/uL (ref 0.0–0.1)
Basophils Relative: 0 %
Eosinophils Absolute: 0 10*3/uL (ref 0.0–0.5)
Eosinophils Relative: 0 %
HCT: 34.9 % — ABNORMAL LOW (ref 36.0–46.0)
Hemoglobin: 9.9 g/dL — ABNORMAL LOW (ref 12.0–15.0)
Immature Granulocytes: 1 %
Lymphocytes Relative: 2 %
Lymphs Abs: 0.3 10*3/uL — ABNORMAL LOW (ref 0.7–4.0)
MCH: 27.3 pg (ref 26.0–34.0)
MCHC: 28.4 g/dL — ABNORMAL LOW (ref 30.0–36.0)
MCV: 96.4 fL (ref 80.0–100.0)
Monocytes Absolute: 0.6 10*3/uL (ref 0.1–1.0)
Monocytes Relative: 4 %
Neutro Abs: 14.4 10*3/uL — ABNORMAL HIGH (ref 1.7–7.7)
Neutrophils Relative %: 93 %
Platelets: 384 10*3/uL (ref 150–400)
RBC: 3.62 MIL/uL — ABNORMAL LOW (ref 3.87–5.11)
RDW: 13.7 % (ref 11.5–15.5)
WBC: 15.4 10*3/uL — ABNORMAL HIGH (ref 4.0–10.5)
nRBC: 0 % (ref 0.0–0.2)

## 2018-11-22 MED ORDER — MECLIZINE HCL 12.5 MG PO TABS
12.5000 mg | ORAL_TABLET | Freq: Once | ORAL | Status: AC
  Administered 2018-11-22: 12.5 mg via ORAL
  Filled 2018-11-22: qty 1

## 2018-11-22 MED ORDER — AMOXICILLIN-POT CLAVULANATE 875-125 MG PO TABS: 1.0000 | ORAL_TABLET | Freq: Two times a day (BID) | ORAL | 0 refills | Status: AC

## 2018-11-22 MED ORDER — PREDNISONE 20 MG PO TABS: 40.0000 mg | ORAL_TABLET | Freq: Every day | ORAL | 0 refills | Status: AC

## 2018-11-22 NOTE — Evaluation (Signed)
Physical Therapy Evaluation Patient Details Name: Eileen [name] MRN: [id number] DOB: 1945-07-04 Today's Date: 11/22/2018   History of Present Illness  Eileen [name]  is a 73 y.o. female, w hx of right breast cancer in remission L 1 compression fracture s/p kyphoplasty, iron deficiency anemia, diverticulosis, dm2, Aflutter, moderate mitral regurgitation , mild- mod pulmonary hypertension (echo 03/22/2015), , Copd, chronic respiratory failure at 3.5L at baseline, w recent admission for Hcap and Copd exacerbation presents with hypoxia,  Pt found with pox 40% on 6L  and trying to rip off her oxygen per her son.    Clinical Impression  Patient has to use side rails for sitting up at bedside with slightly labored movement, demonstrates slow labored cadence during ambulation with scissoring of legs once fatigued/SOB increasing fall risk, on 4 LPM O2 with SpO2 dropping from 92% to 84%, HR up to 154 bpm, after sitting in chair required 5-6 minutes SpO2 recovered to 91% and HR decreased to 105 bpm.  Patient tolerated sitting up in chair after therapy - RN notified.  Patient will benefit from continued physical therapy in hospital and recommended venue below to increase strength, balance, endurance for safe ADLs and gait.    Follow Up Recommendations SNF;Supervision for mobility/OOB;Supervision/Assistance - 24 hour    Equipment Recommendations  None recommended by PT    Recommendations for Other Services       Precautions / Restrictions Precautions Precautions: Fall Restrictions Weight Bearing Restrictions: No      Mobility  Bed Mobility Overal bed mobility: Needs Assistance Bed Mobility: Supine to Sit     Supine to sit: Supervision     General bed mobility comments: labored movement, has to use bed rail to sit up at bedside  Transfers Overall transfer level: Needs assistance Equipment used: Rolling walker (2 wheeled) Transfers: Sit to/from [facility] Sit to Stand:  Min assist Stand pivot transfers: Min assist       General transfer comment: increased time, labored movement  Ambulation/Gait Ambulation/Gait assistance: Mod assist;Min assist Gait Distance (Feet): 22 Feet Assistive device: Rolling walker (2 wheeled) Gait Pattern/deviations: Decreased step length - right;Decreased step length - left;Decreased stride length Gait velocity: decreased   General Gait Details: slow labored cadence with frequent scissor of legs that worsens once fatigued, on 4 LPM with SpO2 dropping from 92% to 84% and HR increasing above 150 BPM  Stairs            Wheelchair Mobility    Modified Rankin (Stroke Patients Only)       Balance Overall balance assessment: Needs assistance Sitting-balance support: Feet supported;No upper extremity supported Sitting balance-Leahy Scale: Fair Sitting balance - Comments: fair/good seated at bedside   Standing balance support: During functional activity;Bilateral upper extremity supported Standing balance-Leahy Scale: Fair Standing balance comment: using RW                             Pertinent Vitals/Pain Pain Assessment: No/denies pain    Home Living Family/patient expects to be discharged to:: Private residence   Available Help at Discharge: Family;Available 24 hours/day Type of Home: House Home Access: Stairs to enter Entrance Stairs-Rails: Right;Left;Can reach both Entrance Stairs-Number of Steps: 3 Home Layout: One level Home Equipment: Walker - 2 wheels      Prior Function Level of Independence: Needs assistance   Gait / Transfers Assistance Needed: household ambulator with RW, uses 4 LPM O2 at all times at home  ADL's / Homemaking Assistance Needed: assisted for community ADLs' by family        Hand Dominance        Extremity/Trunk Assessment   Upper Extremity Assessment Upper Extremity Assessment: Generalized weakness    Lower Extremity Assessment Lower Extremity  Assessment: Generalized weakness    Cervical / Trunk Assessment Cervical / Trunk Assessment: Normal  Communication   Communication: No difficulties  Cognition Arousal/Alertness: Awake/alert Behavior During Therapy: WFL for tasks assessed/performed Overall Cognitive Status: Within Functional Limits for tasks assessed                                        General Comments      Exercises     Assessment/Plan    PT Assessment Patient needs continued PT services  PT Problem List Decreased strength;Decreased activity tolerance;Decreased balance;Decreased mobility       PT Treatment Interventions Gait training;Stair training;Functional mobility training;Therapeutic activities;Therapeutic exercise;Patient/family education    PT Goals (Current goals can be found in the Care Plan section)  Acute Rehab PT Goals Patient Stated Goal: return home with family to assist PT Goal Formulation: With patient Time For Goal Achievement: 12/06/18 Potential to Achieve Goals: Good    Frequency Min 3X/week   Barriers to discharge        Co-evaluation               AM-PAC PT "6 Clicks" Mobility  Outcome Measure Help needed turning from your back to your side while in a flat bed without using bedrails?: A Little(has to use bedrail) Help needed moving from lying on your back to sitting on the side of a flat bed without using bedrails?: A Little(has to use bedrail) Help needed moving to and from a bed to a chair (including a wheelchair)?: A Little Help needed standing up from a chair using your arms (e.g., wheelchair or bedside chair)?: A Little Help needed to walk in hospital room?: A Lot Help needed climbing 3-5 steps with a railing? : A Lot 6 Click Score: 16    End of Session Equipment Utilized During Treatment: Oxygen Activity Tolerance: Patient tolerated treatment well;Patient limited by fatigue Patient left: in chair;with call bell/phone within reach Nurse  Communication: Mobility status PT Visit Diagnosis: Unsteadiness on feet (R26.81);Other abnormalities of gait and mobility (R26.89);Muscle weakness (generalized) (M62.81)    Time: [id number] PT Time Calculation (min) (ACUTE ONLY): 30 min   Charges:   PT Evaluation $PT Eval Moderate Complexity: 1 Mod PT Treatments $Therapeutic Activity: 23-37 mins        10:16 AM, 11/22/18 Lonell Grandchild, MPT Physical Therapist with [facility] [phone] office [phone] mobile phone

## 2018-11-22 NOTE — Discharge Summary (Signed)
Physician Discharge Summary  Eileen [name] [id number] DOB: 1945/05/19 DOA: 11/15/2018  PCP: Lavella Lemons, PA  Admit date: 11/15/2018  Discharge date: 11/22/2018  Admitted From:Home  Disposition:  Home   Recommendations for Outpatient Follow-up:  1. Follow up with PCP in 1-2 weeks 2. Continue on Augmentin for 3 more days to finish course of treatment for aspiration pneumonia as prescribed 3. Remain on home 4 L nasal cannula as prior 4. Finish course of prednisone 40 mg daily over the next 5 days 5. Remain on Seroquel as prior every day at [place]: Yes with PT, RN, CSW, SLP, and aide  Equipment/Devices: Has home oxygen  Discharge Condition: Stable  CODE STATUS: Full  Diet recommendation: Dysphagia 3 diet  Brief/Interim Summary: 73 y/o female with pulmonary hypertension, COPD, chronic respiratory failure on 3.5 L oxygen at baseline presented with severe hypoxia and acute on chronic respiratory failure with hypoxia and severe COPD exacerbation requiring BiPAP therapy and stepdown ICU placement.  9/9: Currently on bipap this am with ongoing treatments. No acute events noted overnight. Palliative planning to have meeting with family today regarding goals of care.  9/10: Patient has remained off BiPAP overnight with no acute events noted.  Plans are for barium swallow this morning.  9/11: Swallow evaluation reveals need for dysphagia 3 diet which will be recommended at home.  We will have SLP follow-up with her at home as well.  She is stable for discharge today from a medical standpoint.  PT has evaluated her and recommended SNF, however family members are still adamant about her coming home and are concerned about COVID at facilities.  Additionally, it does not appear that the siblings are communicating with one another regarding future plans for rehospitalization and/or facility placement.  This is quite unfortunate, as patient is a very high risk for readmission and  palliative care has been discussing future goals of care with this patient and family members.  At the time of discharge, she will require Augmentin for 3 more days to complete course of treatment as well as prednisone for 5 days.  She will use her home nebulizer machine as needed for wheezing and shortness of breath and will remain on her home oxygen as prior.  She will also need to remain on Seroquel at 1800 every day.  No other acute events noted throughout the course of this hospitalization.  Discharge Diagnoses:  Principal Problem:   Acute respiratory failure with hypoxia and hypercapnia (HCC) Active Problems:   COPD with acute exacerbation (HCC)   Atrial flutter (HCC)   Diabetes mellitus ([place])   Aspiration pneumonia ([place])   Dysphagia  Principal discharge diagnosis: Acute on chronic hypoxemic respiratory failure secondary to acute COPD exacerbation related to aspiration pneumonia.  Discharge Instructions  Discharge Instructions    Diet - low sodium heart healthy   Complete by: As directed    Increase activity slowly   Complete by: As directed      Allergies as of 11/22/2018      Reactions   Codeine Other (See Comments)   "jittery"      Medication List    TAKE these medications   acetaminophen 500 MG tablet Commonly known as: TYLENOL Take 500 mg by mouth every 8 (eight) hours as needed for mild pain or headache.   amoxicillin-clavulanate 875-125 MG tablet Commonly known as: Augmentin Take 1 tablet by mouth 2 (two) times daily for 3 days.   furosemide 40 MG tablet Commonly known  as: LASIX Take 1.5 tablets (60 mg total) by mouth daily.   metFORMIN 500 MG 24 hr tablet Commonly known as: GLUCOPHAGE-XR Take 500 mg by mouth daily with breakfast.   metoprolol tartrate 50 MG tablet Commonly known as: LOPRESSOR Take 50 mg by mouth 2 (two) times daily.   polyethylene glycol 17 g packet Commonly known as: MIRALAX / GLYCOLAX Take 17 g by mouth daily as needed for mild  constipation.   potassium chloride SA 20 MEQ tablet Commonly known as: K-DUR Take 20 mEq by mouth 2 (two) times daily.   predniSONE 20 MG tablet Commonly known as: Deltasone Take 2 tablets (40 mg total) by mouth daily for 5 days.   albuterol (2.5 MG/3ML) 0.083% nebulizer solution Commonly known as: PROVENTIL Take 2.5 mg by nebulization every 6 (six) hours as needed for wheezing or shortness of breath.   ProAir HFA 108 (90 Base) MCG/ACT inhaler Generic drug: albuterol Inhale 2 puffs into the lungs every 6 (six) hours as needed for wheezing or shortness of breath.   QUEtiapine 25 MG tablet Commonly known as: SEROQUEL Take 1 tablet (25 mg total) by mouth daily at 6 PM.   rivaroxaban 20 MG Tabs tablet Commonly known as: Xarelto Take 1 tablet (20 mg total) by mouth daily with supper. Stop Xarelto until Monday (08/26/18) in anticipation for kyphoplasty   Trelegy Ellipta 100-62.5-25 MCG/INH Aepb Generic drug: Fluticasone-Umeclidin-Vilant Take 1 puff by mouth daily.      Follow-up Information    Lavella Lemons, PA Follow up in 1 week(s).   Specialty: Physician Assistant Contact information: [place] [address] [phone]        Herminio Commons, MD .   Specialty: Cardiology Contact information: [place] [address] [phone]          Allergies  Allergen Reactions  . Codeine Other (See Comments)    "jittery"    Consultations:  Pulmonology  Palliative care   Procedures/Studies: Ct Head Wo Contrast  Result Date: 11/06/2018 CLINICAL DATA:  Altered level of consciousness EXAM: CT HEAD WITHOUT CONTRAST TECHNIQUE: Contiguous axial images were obtained from the base of the skull through the vertex without intravenous contrast. COMPARISON:  10/26/2018 FINDINGS: Brain: No acute intracranial abnormality. Specifically, no hemorrhage, hydrocephalus, mass lesion, acute infarction, or significant intracranial injury.  Vascular: No hyperdense vessel or unexpected calcification. Skull: No acute calvarial abnormality. Sinuses/Orbits: Visualized paranasal sinuses and mastoids clear. Orbital soft tissues unremarkable. Other: None IMPRESSION: No acute intracranial abnormality. Electronically Signed   By: Rolm Baptise M.D.   On: 11/06/2018 18:45   Ct Head Wo Contrast  Result Date: 10/26/2018 CLINICAL DATA:  Patient has had some episodes of agitation and confusion during this hospitalization which has worsened overnight. Daughter states that this is been ongoing for several weeks at home as well.Altered level of consciousness (LOC), unexplained EXAM: CT HEAD WITHOUT CONTRAST TECHNIQUE: Contiguous axial images were obtained from the base of the skull through the vertex without intravenous contrast. COMPARISON:  None. FINDINGS: Brain: No acute intracranial hemorrhage. No focal mass lesion. No CT evidence of acute infarction. No midline shift or mass effect. No hydrocephalus. Basilar cisterns are patent. Mild periventricular white matter hypodensities. Vascular: No hyperdense vessel or unexpected calcification. Skull: Normal. Negative for fracture or focal lesion. Sinuses/Orbits: Paranasal sinuses and mastoid air cells are clear. Orbits are clear. Other: None. IMPRESSION: No acute intracranial findings. Electronically Signed   By: Helane Gunther.D.  On: 10/26/2018 15:45   Mr Brain Wo Contrast  Result Date: 11/08/2018 CLINICAL DATA:  73 year old female with unexplained altered mental status for 2 days. Encephalopathy. EXAM: MRI HEAD WITHOUT CONTRAST TECHNIQUE: Multiplanar, multiecho pulse sequences of the brain and surrounding structures were obtained without intravenous contrast. COMPARISON:  Head CTs 11/06/2018 and earlier. FINDINGS: Brain: No restricted diffusion to suggest acute infarction. No midline shift, mass effect, evidence of mass lesion, ventriculomegaly, extra-axial collection or acute intracranial hemorrhage.  Cervicomedullary junction and pituitary are within normal limits. Axial T2 images are suboptimal due to motion, and the patient discontinued the exam before coronal T2 images could be obtained. However, gray and white matter signal is largely normal for age throughout the brain. There is mild nonspecific patchy mostly periventricular white matter T2 and FLAIR hyperintensity. No cortical encephalomalacia or chronic blood products identified. The deep gray nuclei, brainstem and cerebellum appear negative. Vascular: Major intracranial vascular flow voids are preserved. Skull and upper cervical spine: Negative visible cervical spine. Visualized bone marrow signal is within normal limits. Sinuses/Orbits: Motion artifact at the orbits which appear grossly negative. Paranasal sinuses are stable and well pneumatized. Other: There is a mild right mastoid effusion redemonstrated. Negative nasopharynx. Left mastoids are clear. Scalp and face soft tissues appear negative. IMPRESSION: 1. No acute intracranial abnormality, and largely negative for age non-contrast MRI appearance of the brain allowing for some motion degradation. 2. Mild right mastoid effusion, likely postinflammatory. Electronically Signed   By: Genevie Ann M.D.   On: 11/08/2018 15:32   Dg Chest Port 1 View  Result Date: 11/19/2018 CLINICAL DATA:  Acute respiratory failure with hypoxia. EXAM: PORTABLE CHEST 1 VIEW COMPARISON:  Radiograph of November 17, 2018. FINDINGS: Stable cardiomegaly. No pneumothorax is noted. Increased right basilar atelectasis or infiltrate is noted. Minimal left basilar subsegmental atelectasis is noted. Small pleural effusions may be present. Bony thorax is unremarkable. IMPRESSION: Increased right basilar opacity as described above. Electronically Signed   By: Marijo Conception M.D.   On: 11/19/2018 07:29   Dg Chest Port 1 View  Result Date: 11/17/2018 CLINICAL DATA:  Acute respiratory failure.  History of COPD. EXAM: PORTABLE CHEST 1  VIEW COMPARISON:  Single-view of the chest 11/15/2018 and 11/06/2018. CT chest 09/27/2018. FINDINGS: The lungs are emphysematous. Right basilar opacity seen on the most recent examination has resolved. Minimal atelectasis left lung base noted. Heart size is enlarged. No pneumothorax or pleural fluid. IMPRESSION: Resolved right basilar airspace opacity. No change in left basilar subsegmental atelectasis. Emphysema. Atherosclerosis. Electronically Signed   By: Inge Rise M.D.   On: 11/17/2018 09:06   Dg Chest Portable 1 View  Result Date: 11/16/2018 CLINICAL DATA:  Respiratory distress EXAM: PORTABLE CHEST 1 VIEW COMPARISON:  November 06, 2018 FINDINGS: There is mild cardiomegaly. Streaky airspace opacity seen at the right lung base. The left lung is clear. No acute osseous abnormality. IMPRESSION: Streaky airspace opacity at the right lung base which could be due to atelectasis and/or early infectious etiology. Electronically Signed   By: Prudencio Pair M.D.   On: 11/16/2018 00:07   Dg Chest Port 1 View  Result Date: 11/06/2018 CLINICAL DATA:  Shortness of breath EXAM: PORTABLE CHEST 1 VIEW COMPARISON:  October 23, 2018 FINDINGS: There is persistent airspace opacity in the left base. There is atelectatic change in the right base. Heart remains enlarged with mild pulmonary venous hypertension. No adenopathy. No bone lesions. IMPRESSION: Persistent airspace consolidation consistent with pneumonia left base. Atelectasis right base. Underlying cardiomegaly  with a degree of pulmonary vascular congestion. Electronically Signed   By: Lowella Grip III M.D.   On: 11/06/2018 17:08   Dg Chest Port 1 View  Result Date: 10/23/2018 CLINICAL DATA:  Shortness of breath EXAM: PORTABLE CHEST 1 VIEW COMPARISON:  October 18, 2018 FINDINGS: There is slight interval worsening in the patchy airspace opacities seen at both lung bases. There is mildly increased interstitial markings seen throughout both lungs. The  cardiomediastinal silhouette is unchanged. No acute osseous abnormality. IMPRESSION: Slight interval worsening in the patchy airspace opacities in both lung bases which could be due to edema, atelectasis, and/or infectious etiology. Electronically Signed   By: Prudencio Pair M.D.   On: 10/23/2018 23:28   Dg Swallowing Func-speech Pathology  Result Date: 11/21/2018 Objective Swallowing Evaluation: Type of Study: MBS-Modified Barium Swallow Study  Patient Details Name: Eileen [name] MRN: [id number] Date of Birth: 02-14-1946 Today's Date: 11/21/2018 Time: SLP Start Time (ACUTE ONLY): 1210 -SLP Stop Time (ACUTE ONLY): 1245 SLP Time Calculation (min) (ACUTE ONLY): 35 min Past Medical History: Past Medical History: Diagnosis Date . Asthma  . Atrial flutter ([place])  . Cancer [facility])   Right breast . COPD (chronic obstructive pulmonary disease) ([place])  . Dementia ([place])  . History of breast cancer   right breast . Hypertension  . On home O2  . Type 2 diabetes mellitus ([place])  Past Surgical History: Past Surgical History: Procedure Laterality Date . BREAST SURGERY  2011  Right breast mastectomy . CARDIOVERSION N/A 06/27/2018  Procedure: CARDIOVERSION;  Surgeon: Arnoldo Lenis, MD;  [facility];  Service: Endoscopy;  Laterality: N/A; . CESAREAN SECTION   . COLONOSCOPY N/A 01/22/2018  Procedure: COLONOSCOPY;  Surgeon: Rogene Houston, MD;  Location: [facility];  Service: Endoscopy;  Laterality: N/A; . HERNIA REPAIR    RIH . IR VERTEBROPLASTY LUMBAR BX INC UNI/BIL INC/INJECT/IMAGING  09/05/2018 . MASTECTOMY  2011  right breast . TEE WITHOUT CARDIOVERSION N/A 06/27/2018  Procedure: TRANSESOPHAGEAL ECHOCARDIOGRAM (TEE) WITH PROPOFOL;  Surgeon: Arnoldo Lenis, MD;  [facility];  Service: Endoscopy;  Laterality: N/A; HPI: Eileen A Dungee is a 73 y.o. female with a multiple medical problems as documented below but most importantly COPD and CHF who presents the emergency department today with respiratory  distress.  Patient, on review of records, is here often for respiratory failure with hypoxia and hypercarbia.  Symptoms altered with that as well.  Onset tonight was similar.  EMS got to her and her sats were low they gave her albuterol brought here for further evaluation.  No further history able to be obtained secondary to patient's mental status. BSE requested.  Subjective: Pt alert and cooperative, "doing better" Assessment / Plan / Recommendation CHL IP CLINICAL IMPRESSIONS 11/21/2018 Clinical Impression Pt presents with min oropharyngeal dysphagia characterized by min premature spillage of liquids to the valleculae and spilling to the pyriforms with occasional flash penetration without aspiration except for one episode of overt aspiration before the swallow when taking straw sips of thin liquids to due premature spillage and suspected breath inhalation. Aspiration was silent and was not removed despite SLP prompts for coughing. Aspiration of thins with a straw was mitigated through use of chin tuck and small sips. Recommend regular textures and thin liquids, NO STRAWS, swallow 2x for each swallow and clear throat periodically. Pt appreciative for recommendations and verbalized that she will take small cup sips of liquids. SLP will follow for diet tolerance x1, however no SLP  f/u post acute necessary at this time.  SLP Visit Diagnosis Dysphagia, oropharyngeal phase (R13.12) Attention and concentration deficit following -- Frontal lobe and executive function deficit following -- Impact on safety and function Mild aspiration risk   CHL IP TREATMENT RECOMMENDATION 11/21/2018 Treatment Recommendations Therapy as outlined in treatment plan below   Prognosis 11/21/2018 Prognosis for Safe Diet Advancement Good Barriers to Reach Goals -- Barriers/Prognosis Comment -- CHL IP DIET RECOMMENDATION 11/21/2018 SLP Diet Recommendations Dysphagia 3 (Mech soft) solids;Thin liquid Liquid Administration via Cup;No straw Medication  Administration Whole meds with puree Compensations Slow rate;Small sips/bites Postural Changes Remain semi-upright after after feeds/meals (Comment);Seated upright at 90 degrees   CHL IP OTHER RECOMMENDATIONS 11/21/2018 Recommended Consults -- Oral Care Recommendations Oral care BID;Staff/trained caregiver to provide oral care Other Recommendations Clarify dietary restrictions   CHL IP FOLLOW UP RECOMMENDATIONS 11/21/2018 Follow up Recommendations None   CHL IP FREQUENCY AND DURATION 11/21/2018 Speech Therapy Frequency (ACUTE ONLY) min 2x/week Treatment Duration 1 week      CHL IP ORAL PHASE 11/21/2018 Oral Phase Impaired Oral - Pudding Teaspoon -- Oral - Pudding Cup -- Oral - Honey Teaspoon -- Oral - Honey Cup -- Oral - Nectar Teaspoon -- Oral - Nectar Cup -- Oral - Nectar Straw -- Oral - Thin Teaspoon -- Oral - Thin Cup -- Oral - Thin Straw -- Oral - Puree -- Oral - Mech Soft -- Oral - Regular -- Oral - Multi-Consistency -- Oral - Pill -- Oral Phase - Comment --  CHL IP PHARYNGEAL PHASE 11/21/2018 Pharyngeal Phase Impaired Pharyngeal- Pudding Teaspoon -- Pharyngeal -- Pharyngeal- Pudding Cup -- Pharyngeal -- Pharyngeal- Honey Teaspoon -- Pharyngeal -- Pharyngeal- Honey Cup -- Pharyngeal -- Pharyngeal- Nectar Teaspoon -- Pharyngeal -- Pharyngeal- Nectar Cup -- Pharyngeal -- Pharyngeal- Nectar Straw -- Pharyngeal -- Pharyngeal- Thin Teaspoon Delayed swallow initiation-vallecula;Delayed swallow initiation-pyriform sinuses Pharyngeal -- Pharyngeal- Thin Cup Delayed swallow initiation-vallecula;Delayed swallow initiation-pyriform sinuses;Penetration/Aspiration during swallow Pharyngeal Material enters airway, CONTACTS cords and then ejected out;Material does not enter airway Pharyngeal- Thin Straw Delayed swallow initiation-pyriform sinuses;Penetration/Aspiration before swallow;Reduced airway/laryngeal closure;Trace aspiration;Moderate aspiration Pharyngeal Material enters airway, passes BELOW cords without attempt by  patient to eject out (silent aspiration) Pharyngeal- Puree Delayed swallow initiation-vallecula Pharyngeal -- Pharyngeal- Mechanical Soft -- Pharyngeal -- Pharyngeal- Regular Delayed swallow initiation-vallecula Pharyngeal -- Pharyngeal- Multi-consistency -- Pharyngeal -- Pharyngeal- Pill Penetration/Aspiration during swallow Pharyngeal Material enters airway, remains ABOVE vocal cords then ejected out Pharyngeal Comment mild/mod aspiration of straw sips thin liquid before the swallow due to premature spillage and reduced vocal fold closure, silent  CHL IP CERVICAL ESOPHAGEAL PHASE 11/21/2018 Cervical Esophageal Phase WFL Pudding Teaspoon -- Pudding Cup -- Honey Teaspoon -- Honey Cup -- Nectar Teaspoon -- Nectar Cup -- Nectar Straw -- Thin Teaspoon -- Thin Cup -- Thin Straw -- Puree -- Mechanical Soft -- Regular -- Multi-consistency -- Pill -- Cervical Esophageal Comment -- Thank you, [name], CCC-SLP [phone] PORTER,DABNEY 11/21/2018, 5:59 PM                Discharge Exam: Vitals:   11/22/18 0525 11/22/18 0720  BP:    Pulse: 76   Resp:    Temp:    SpO2:  94%   Vitals:   11/22/18 0334 11/22/18 0418 11/22/18 0525 11/22/18 0720  BP:  (!) 121/92    Pulse:  71 76   Resp:  16    Temp:  98.1 F (36.7 C)    TempSrc:      SpO2:  96%  94%  Weight: 70.8 kg     Height:        General: Pt is alert, awake, not in acute distress Cardiovascular: RRR, S1/S2 +, no rubs, no gallops Respiratory: CTA bilaterally, no wheezing, no rhonchi, currently on 4 L nasal cannula oxygen Abdominal: Soft, NT, ND, bowel sounds + Extremities: no edema, no cyanosis    The results of significant diagnostics from this hospitalization (including imaging, microbiology, ancillary and laboratory) are listed below for reference.     Microbiology: Recent Results (from the past 240 hour(s))  SARS Coronavirus 2 [facility] order, Performed in [facility] hospital lab)     Status: None   Collection Time: 11/15/18  11:10 PM  Result Value Ref Range Status   SARS Coronavirus 2 NEGATIVE NEGATIVE Final    Comment: (NOTE) If result is NEGATIVE SARS-CoV-2 target nucleic acids are NOT DETECTED. The SARS-CoV-2 RNA is generally detectable in upper and lower  respiratory specimens during the acute phase of infection. The lowest  concentration of SARS-CoV-2 viral copies this assay can detect is 250  copies / mL. A negative result does not preclude SARS-CoV-2 infection  and should not be used as the sole basis for treatment or other  patient management decisions.  A negative result may occur with  improper specimen collection / handling, submission of specimen other  than nasopharyngeal swab, presence of viral mutation(s) within the  areas targeted by this assay, and inadequate number of viral copies  (<250 copies / mL). A negative result must be combined with clinical  observations, patient history, and epidemiological information. If result is POSITIVE SARS-CoV-2 target nucleic acids are DETECTED. The SARS-CoV-2 RNA is generally detectable in upper and lower  respiratory specimens dur ing the acute phase of infection.  Positive  results are indicative of active infection with SARS-CoV-2.  Clinical  correlation with patient history and other diagnostic information is  necessary to determine patient infection status.  Positive results do  not rule out bacterial infection or co-infection with other viruses. If result is PRESUMPTIVE POSTIVE SARS-CoV-2 nucleic acids MAY BE PRESENT.   A presumptive positive result was obtained on the submitted specimen  and confirmed on repeat testing.  While 2019 novel coronavirus  (SARS-CoV-2) nucleic acids may be present in the submitted sample  additional confirmatory testing may be necessary for epidemiological  and / or clinical management purposes  to differentiate between  SARS-CoV-2 and other Sarbecovirus currently known to infect humans.  If clinically indicated  additional testing with an alternate test  methodology [phone]) is advised. The SARS-CoV-2 RNA is generally  detectable in upper and lower respiratory sp ecimens during the acute  phase of infection. The expected result is Negative. Fact Sheet for Patients:  StrictlyIdeas.no Fact Sheet for Healthcare Providers: BankingDealers.co.za This test is not yet approved or cleared by the [place] FDA and has been authorized for detection and/or diagnosis of SARS-CoV-2 by FDA under an Emergency Use Authorization (EUA).  This EUA will remain in effect (meaning this test can be used) for the duration of the COVID-19 declaration under Section 564(b)(1) of the Act, 21 U.S.C. section 360bbb-3(b)(1), unless the authorization is terminated or revoked sooner. Performed at [facility], [address], [place], [place] [address]      Labs: BNP (last 3 results) Recent Labs    09/26/18 1419 10/24/18 0000 11/15/18 2330  BNP 302.0* 318.0* 0000000*   Basic Metabolic Panel: Recent Labs  Lab 11/19/18 0607 11/19/18 1211 11/20/18 0418 11/21/18 0353 11/22/18  0454  NA 142 140 141 141 139  K 5.4* 5.2* 4.8 4.3 4.3  CL 93* 89* 91* 90* 89*  CO2 39* 36* 40* 40* 38*  GLUCOSE 219* 251* 196* 180* 162*  BUN 28* 30* 25* 29* 27*  CREATININE 0.84 1.01* 0.82 0.81 0.72  CALCIUM 9.1 9.3 9.1 9.2 9.0  MG 1.9  --  2.0 2.1  --    Liver Function Tests: Recent Labs  Lab 11/16/18 0508 11/19/18 0607 11/20/18 0418 11/21/18 0353 11/22/18 0454  AST 24 14* 14* 12* 16  ALT 26 20 22 20 18   ALKPHOS 74 62 61 67 66  BILITOT 0.6 0.6 0.8 0.6 0.4  PROT 6.3* 6.0* 6.0* 6.1* 5.8*  ALBUMIN 3.1* 3.1* 3.3* 3.3* 3.0*   No results for input(s): LIPASE, AMYLASE in the last 168 hours. No results for input(s): AMMONIA in the last 168 hours. CBC: Recent Labs  Lab 11/15/18 2330  11/18/18 0434 11/19/18 0607 11/20/18 0418 11/21/18 0353 11/22/18 0454  WBC 12.8*   < > 11.3*  10.9* 9.5 12.1* 15.4*  NEUTROABS 9.6*  --   --  9.8* 8.5* 10.9* 14.4*  HGB 10.6*   < > 9.7* 9.1* 9.4* 10.1* 9.9*  HCT 39.1   < > 34.9* 32.7* 33.5* 37.0 34.9*  MCV 101.8*   < > 98.3 97.9 98.8 98.7 96.4  PLT 414*   < > 377 350 366 394 384   < > = values in this interval not displayed.   Cardiac Enzymes: No results for input(s): CKTOTAL, CKMB, CKMBINDEX, TROPONINI in the last 168 hours. BNP: Invalid input(s): POCBNP CBG: Recent Labs  Lab 11/21/18 2044 11/22/18 0003 11/22/18 0052 11/22/18 0410 11/22/18 0732  GLUCAP 184* 67* 96 148* 129*   D-Dimer No results for input(s): DDIMER in the last 72 hours. Hgb A1c No results for input(s): HGBA1C in the last 72 hours. Lipid Profile No results for input(s): CHOL, HDL, LDLCALC, TRIG, CHOLHDL, LDLDIRECT in the last 72 hours. Thyroid function studies No results for input(s): TSH, T4TOTAL, T3FREE, THYROIDAB in the last 72 hours.  Invalid input(s): FREET3 Anemia work up No results for input(s): VITAMINB12, FOLATE, FERRITIN, TIBC, IRON, RETICCTPCT in the last 72 hours. Urinalysis    Component Value Date/Time   COLORURINE YELLOW 11/06/2018 1638   APPEARANCEUR CLEAR 11/06/2018 1638   LABSPEC 1.009 11/06/2018 1638   PHURINE 5.0 11/06/2018 1638   GLUCOSEU NEGATIVE 11/06/2018 [place] 11/06/2018 [place] 11/06/2018 [place] 11/06/2018 1638   PROTEINUR 30 (A) 11/06/2018 1638   UROBILINOGEN 0.2 09/09/2009 1010   NITRITE POSITIVE (A) 11/06/2018 1638   LEUKOCYTESUR TRACE (A) 11/06/2018 1638   Sepsis Labs Invalid input(s): PROCALCITONIN,  WBC,  LACTICIDVEN Microbiology Recent Results (from the past 240 hour(s))  SARS Coronavirus 2 [facility] order, Performed in [place] hospital lab)     Status: None   Collection Time: 11/15/18 11:10 PM  Result Value Ref Range Status   SARS Coronavirus 2 NEGATIVE NEGATIVE Final    Comment: (NOTE) If result is NEGATIVE SARS-CoV-2 target nucleic acids  are NOT DETECTED. The SARS-CoV-2 RNA is generally detectable in upper and lower  respiratory specimens during the acute phase of infection. The lowest  concentration of SARS-CoV-2 viral copies this assay can detect is 250  copies / mL. A negative result does not preclude SARS-CoV-2 infection  and should not be used as the sole basis for treatment or other  patient management decisions.  A  negative result may occur with  improper specimen collection / handling, submission of specimen other  than nasopharyngeal swab, presence of viral mutation(s) within the  areas targeted by this assay, and inadequate number of viral copies  (<250 copies / mL). A negative result must be combined with clinical  observations, patient history, and epidemiological information. If result is POSITIVE SARS-CoV-2 target nucleic acids are DETECTED. The SARS-CoV-2 RNA is generally detectable in upper and lower  respiratory specimens dur ing the acute phase of infection.  Positive  results are indicative of active infection with SARS-CoV-2.  Clinical  correlation with patient history and other diagnostic information is  necessary to determine patient infection status.  Positive results do  not rule out bacterial infection or co-infection with other viruses. If result is PRESUMPTIVE POSTIVE SARS-CoV-2 nucleic acids MAY BE PRESENT.   A presumptive positive result was obtained on the submitted specimen  and confirmed on repeat testing.  While 2019 novel coronavirus  (SARS-CoV-2) nucleic acids may be present in the submitted sample  additional confirmatory testing may be necessary for epidemiological  and / or clinical management purposes  to differentiate between  SARS-CoV-2 and other Sarbecovirus currently known to infect humans.  If clinically indicated additional testing with an alternate test  methodology [phone]) is advised. The SARS-CoV-2 RNA is generally  detectable in upper and lower respiratory sp ecimens  during the acute  phase of infection. The expected result is Negative. Fact Sheet for Patients:  StrictlyIdeas.no Fact Sheet for Healthcare Providers: BankingDealers.co.za This test is not yet approved or cleared by the [place] FDA and has been authorized for detection and/or diagnosis of SARS-CoV-2 by FDA under an Emergency Use Authorization (EUA).  This EUA will remain in effect (meaning this test can be used) for the duration of the COVID-19 declaration under Section 564(b)(1) of the Act, 21 U.S.C. section 360bbb-3(b)(1), unless the authorization is terminated or revoked sooner. Performed at [facility], [address] [place], [place] [address]      Time coordinating discharge: 35 minutes  SIGNED:   Rodena Goldmann, DO [facility] 11/22/2018, 10:37 AM  If 7PM-7AM, please contact night-coverage [email or web address] Password [email or web address]

## 2018-11-22 NOTE — Care Management Important Message (Signed)
Important Message  Patient Details  Name: Eileen [name] MRN: [id number] Date of Birth: 1945/05/11   Medicare Important Message Given:  Yes     [name] 11/22/2018, 1:10 PM

## 2018-11-22 NOTE — Progress Notes (Signed)
Inpatient Diabetes Program Recommendations  AACE/ADA: New Consensus Statement on Inpatient Glycemic Control   Target Ranges:  Prepandial:   less than 140 mg/dL      Peak postprandial:   less than 180 mg/dL (1-2 hours)      Critically ill patients:  140 - 180 mg/dL  Results for [place], [name] (MRN [id number]) as of 11/22/2018 08:18  Ref. Range 11/21/2018 07:40 11/21/2018 11:44 11/21/2018 15:36 11/21/2018 20:44 11/22/2018 00:03 11/22/2018 00:52 11/22/2018 04:10 11/22/2018 07:32  Glucose-Capillary Latest Ref Range: 70 - 99 mg/dL 175 (H) 263 (H) 224 (H) 184 (H) 67 (L) 96 148 (H) 129 (H)    Review of Glycemic Control  Diabetes history:DM2 Outpatient Diabetes medications:Metformin XR 500 mg daily Current orders for Inpatient glycemic control:Novolog 0-20 units Q4H, Lantus 20 units daily; Solumedrol 40 mg Q8H  Inpatient Diabetes Program Recommendations:   Insulin-Basal: If Solumedrol is continued, please consider increasing Lantus to 22 units daily.  Insulin-Correction: Please consider decreasing Novolog correction scale to 0-15 units Q4H.  Insulin-Meal Coverage: If Solumedrol is continued, please consider ordering Novolog 3 units TID with meals for meal coverage if patient eats at least 50% of meals.  Thanks, Barnie Alderman, RN, MSN, CDE Diabetes Coordinator Inpatient Diabetes Program [phone] (Team Pager from 8am to 5pm)

## 2018-11-22 NOTE — Plan of Care (Signed)
  Problem: Acute Rehab PT Goals(only PT should resolve) Goal: Pt Will Go Supine/Side To Sit Outcome: Progressing Flowsheets (Taken 11/22/2018 1018) Pt will go Supine/Side to Sit: with modified independence Goal: Patient Will Transfer Sit To/From Stand Outcome: Progressing Flowsheets (Taken 11/22/2018 1018) Patient will transfer sit to/from stand: with min guard assist Goal: Pt Will Transfer Bed To Chair/Chair To Bed Outcome: Progressing Flowsheets (Taken 11/22/2018 1018) Pt will Transfer Bed to Chair/Chair to Bed: min guard assist Goal: Pt Will Ambulate Outcome: Progressing Flowsheets (Taken 11/22/2018 1018) Pt will Ambulate:  50 feet  with min guard assist  with minimal assist  with rolling walker   10:19 AM, 11/22/18 Lonell Grandchild, MPT Physical Therapist with [facility] [phone] office [phone] mobile phone

## 2018-11-25 ENCOUNTER — Other Ambulatory Visit: Payer: Self-pay | Admitting: *Deleted

## 2018-11-25 DIAGNOSIS — J449 Chronic obstructive pulmonary disease, unspecified: Secondary | ICD-10-CM | POA: Diagnosis not present

## 2018-11-25 DIAGNOSIS — I10 Essential (primary) hypertension: Secondary | ICD-10-CM | POA: Diagnosis not present

## 2018-11-25 DIAGNOSIS — I480 Paroxysmal atrial fibrillation: Secondary | ICD-10-CM | POA: Diagnosis not present

## 2018-11-25 DIAGNOSIS — G9341 Metabolic encephalopathy: Secondary | ICD-10-CM | POA: Diagnosis not present

## 2018-11-25 NOTE — Patient Outreach (Signed)
Outreach call to pt/ daughter for post hospital follow up, no answer to telephone and no option to leave voicemail  RN CM previously mailed unsuccessful outreach letter with no call back from pt/ family.  Primary MD completes transition of care, pt hospitalized 9/4-9/11/20 for COPD exacerbation, palliative consult completed during this hospitalization, note states pt needs SNF and family refuses because of Covid and siblings do not communicate with each other regarding a plan of care.  RN CM contacted [facility] at [phone], spoke with Jinny Blossom who reports they were not aware pt discharged from hospital on 11/22/18 and they will see pt for home visit within 24-48 hours and request RN CM call back later this week for an update, pt is to have RN, PT, ST, CSW and CNA.  PLAN Outreach pt in 3-4 business days for post hospital follow up Collaborate with [facility] on completion of advanced directives, goals of care  Jacqlyn Larsen [facility], [place] Coordinator [phone]

## 2018-11-26 DIAGNOSIS — J9602 Acute respiratory failure with hypercapnia: Secondary | ICD-10-CM | POA: Diagnosis not present

## 2018-11-26 DIAGNOSIS — J690 Pneumonitis due to inhalation of food and vomit: Secondary | ICD-10-CM | POA: Diagnosis not present

## 2018-11-26 DIAGNOSIS — J9610 Chronic respiratory failure, unspecified whether with hypoxia or hypercapnia: Secondary | ICD-10-CM | POA: Diagnosis not present

## 2018-11-26 DIAGNOSIS — J9601 Acute respiratory failure with hypoxia: Secondary | ICD-10-CM | POA: Diagnosis not present

## 2018-11-26 DIAGNOSIS — J439 Emphysema, unspecified: Secondary | ICD-10-CM | POA: Diagnosis not present

## 2018-11-27 DIAGNOSIS — J690 Pneumonitis due to inhalation of food and vomit: Secondary | ICD-10-CM | POA: Diagnosis not present

## 2018-11-27 DIAGNOSIS — J9601 Acute respiratory failure with hypoxia: Secondary | ICD-10-CM | POA: Diagnosis not present

## 2018-11-27 DIAGNOSIS — J439 Emphysema, unspecified: Secondary | ICD-10-CM | POA: Diagnosis not present

## 2018-11-27 DIAGNOSIS — J9602 Acute respiratory failure with hypercapnia: Secondary | ICD-10-CM | POA: Diagnosis not present

## 2018-11-27 DIAGNOSIS — J9610 Chronic respiratory failure, unspecified whether with hypoxia or hypercapnia: Secondary | ICD-10-CM | POA: Diagnosis not present

## 2018-11-28 ENCOUNTER — Other Ambulatory Visit: Payer: Self-pay | Admitting: *Deleted

## 2018-11-28 NOTE — Patient Outreach (Signed)
Telephone outreach.  Spoke with Kerin Salen, pt's daughter. She reports her mother is "doing fine." She reports she is breathing fine without distress, wears her O2 at all times. Uses her nebulizer and Trellgy daily and does have her rescue inhaler. She is eating a soft diet well without choking or aspirating.  Home health services are in attendance.  Jacqlyn Larsen, RN, to follow up next week.  Eulah Pont. Myrtie Neither, MSN, [facility] Gerontological Nurse Practitioner [facility] Care Management [phone]

## 2018-11-29 DIAGNOSIS — J690 Pneumonitis due to inhalation of food and vomit: Secondary | ICD-10-CM | POA: Diagnosis not present

## 2018-11-29 DIAGNOSIS — J439 Emphysema, unspecified: Secondary | ICD-10-CM | POA: Diagnosis not present

## 2018-11-29 DIAGNOSIS — J9610 Chronic respiratory failure, unspecified whether with hypoxia or hypercapnia: Secondary | ICD-10-CM | POA: Diagnosis not present

## 2018-11-29 DIAGNOSIS — J9602 Acute respiratory failure with hypercapnia: Secondary | ICD-10-CM | POA: Diagnosis not present

## 2018-11-29 DIAGNOSIS — J9601 Acute respiratory failure with hypoxia: Secondary | ICD-10-CM | POA: Diagnosis not present

## 2018-11-30 DIAGNOSIS — J439 Emphysema, unspecified: Secondary | ICD-10-CM | POA: Diagnosis not present

## 2018-11-30 DIAGNOSIS — J9602 Acute respiratory failure with hypercapnia: Secondary | ICD-10-CM | POA: Diagnosis not present

## 2018-11-30 DIAGNOSIS — J690 Pneumonitis due to inhalation of food and vomit: Secondary | ICD-10-CM | POA: Diagnosis not present

## 2018-11-30 DIAGNOSIS — J9601 Acute respiratory failure with hypoxia: Secondary | ICD-10-CM | POA: Diagnosis not present

## 2018-11-30 DIAGNOSIS — J9610 Chronic respiratory failure, unspecified whether with hypoxia or hypercapnia: Secondary | ICD-10-CM | POA: Diagnosis not present

## 2018-12-03 DIAGNOSIS — J690 Pneumonitis due to inhalation of food and vomit: Secondary | ICD-10-CM | POA: Diagnosis not present

## 2018-12-03 DIAGNOSIS — J9610 Chronic respiratory failure, unspecified whether with hypoxia or hypercapnia: Secondary | ICD-10-CM | POA: Diagnosis not present

## 2018-12-03 DIAGNOSIS — J439 Emphysema, unspecified: Secondary | ICD-10-CM | POA: Diagnosis not present

## 2018-12-03 DIAGNOSIS — J9601 Acute respiratory failure with hypoxia: Secondary | ICD-10-CM | POA: Diagnosis not present

## 2018-12-03 DIAGNOSIS — J9602 Acute respiratory failure with hypercapnia: Secondary | ICD-10-CM | POA: Diagnosis not present

## 2018-12-05 ENCOUNTER — Emergency Department (HOSPITAL_COMMUNITY): Payer: Medicare Other

## 2018-12-05 ENCOUNTER — Encounter (HOSPITAL_COMMUNITY): Payer: Self-pay | Admitting: Emergency Medicine

## 2018-12-05 ENCOUNTER — Inpatient Hospital Stay (HOSPITAL_COMMUNITY)
Admission: EM | Admit: 2018-12-05 | Discharge: 2018-12-11 | DRG: 378 | Disposition: A | Discharge status: Home Health | Payer: Medicare Other | Attending: Family Medicine | Admitting: Family Medicine

## 2018-12-05 ENCOUNTER — Other Ambulatory Visit: Payer: Self-pay

## 2018-12-05 DIAGNOSIS — I471 Supraventricular tachycardia: Secondary | ICD-10-CM | POA: Diagnosis not present

## 2018-12-05 DIAGNOSIS — K921 Melena: Secondary | ICD-10-CM

## 2018-12-05 DIAGNOSIS — Z03818 Encounter for observation for suspected exposure to other biological agents ruled out: Secondary | ICD-10-CM | POA: Diagnosis not present

## 2018-12-05 DIAGNOSIS — R918 Other nonspecific abnormal finding of lung field: Secondary | ICD-10-CM | POA: Diagnosis not present

## 2018-12-05 DIAGNOSIS — Z833 Family history of diabetes mellitus: Secondary | ICD-10-CM

## 2018-12-05 DIAGNOSIS — I4892 Unspecified atrial flutter: Secondary | ICD-10-CM | POA: Diagnosis not present

## 2018-12-05 DIAGNOSIS — Z7984 Long term (current) use of oral hypoglycemic drugs: Secondary | ICD-10-CM

## 2018-12-05 DIAGNOSIS — Z885 Allergy status to narcotic agent status: Secondary | ICD-10-CM

## 2018-12-05 DIAGNOSIS — Z87891 Personal history of nicotine dependence: Secondary | ICD-10-CM | POA: Diagnosis not present

## 2018-12-05 DIAGNOSIS — K625 Hemorrhage of anus and rectum: Secondary | ICD-10-CM | POA: Diagnosis present

## 2018-12-05 DIAGNOSIS — Z9011 Acquired absence of right breast and nipple: Secondary | ICD-10-CM | POA: Diagnosis not present

## 2018-12-05 DIAGNOSIS — E1165 Type 2 diabetes mellitus with hyperglycemia: Secondary | ICD-10-CM | POA: Diagnosis not present

## 2018-12-05 DIAGNOSIS — E876 Hypokalemia: Secondary | ICD-10-CM | POA: Diagnosis not present

## 2018-12-05 DIAGNOSIS — J441 Chronic obstructive pulmonary disease with (acute) exacerbation: Secondary | ICD-10-CM | POA: Diagnosis not present

## 2018-12-05 DIAGNOSIS — Z801 Family history of malignant neoplasm of trachea, bronchus and lung: Secondary | ICD-10-CM

## 2018-12-05 DIAGNOSIS — J449 Chronic obstructive pulmonary disease, unspecified: Secondary | ICD-10-CM | POA: Diagnosis not present

## 2018-12-05 DIAGNOSIS — D638 Anemia in other chronic diseases classified elsewhere: Secondary | ICD-10-CM | POA: Diagnosis present

## 2018-12-05 DIAGNOSIS — Z23 Encounter for immunization: Secondary | ICD-10-CM

## 2018-12-05 DIAGNOSIS — J9611 Chronic respiratory failure with hypoxia: Secondary | ICD-10-CM | POA: Diagnosis present

## 2018-12-05 DIAGNOSIS — F0281 Dementia in other diseases classified elsewhere with behavioral disturbance: Secondary | ICD-10-CM | POA: Diagnosis not present

## 2018-12-05 DIAGNOSIS — K922 Gastrointestinal hemorrhage, unspecified: Secondary | ICD-10-CM | POA: Diagnosis not present

## 2018-12-05 DIAGNOSIS — Z79899 Other long term (current) drug therapy: Secondary | ICD-10-CM

## 2018-12-05 DIAGNOSIS — K5909 Other constipation: Secondary | ICD-10-CM | POA: Diagnosis present

## 2018-12-05 DIAGNOSIS — I951 Orthostatic hypotension: Secondary | ICD-10-CM | POA: Diagnosis not present

## 2018-12-05 DIAGNOSIS — Z8701 Personal history of pneumonia (recurrent): Secondary | ICD-10-CM

## 2018-12-05 DIAGNOSIS — N200 Calculus of kidney: Secondary | ICD-10-CM | POA: Diagnosis not present

## 2018-12-05 DIAGNOSIS — Z853 Personal history of malignant neoplasm of breast: Secondary | ICD-10-CM

## 2018-12-05 DIAGNOSIS — J9601 Acute respiratory failure with hypoxia: Secondary | ICD-10-CM | POA: Diagnosis not present

## 2018-12-05 DIAGNOSIS — K5731 Diverticulosis of large intestine without perforation or abscess with bleeding: Secondary | ICD-10-CM | POA: Diagnosis not present

## 2018-12-05 DIAGNOSIS — Z9981 Dependence on supplemental oxygen: Secondary | ICD-10-CM

## 2018-12-05 DIAGNOSIS — J9602 Acute respiratory failure with hypercapnia: Secondary | ICD-10-CM | POA: Diagnosis not present

## 2018-12-05 DIAGNOSIS — E1169 Type 2 diabetes mellitus with other specified complication: Secondary | ICD-10-CM

## 2018-12-05 DIAGNOSIS — F0391 Unspecified dementia with behavioral disturbance: Secondary | ICD-10-CM | POA: Diagnosis present

## 2018-12-05 DIAGNOSIS — Z20828 Contact with and (suspected) exposure to other viral communicable diseases: Secondary | ICD-10-CM | POA: Diagnosis present

## 2018-12-05 DIAGNOSIS — I4891 Unspecified atrial fibrillation: Secondary | ICD-10-CM | POA: Diagnosis present

## 2018-12-05 DIAGNOSIS — I10 Essential (primary) hypertension: Secondary | ICD-10-CM | POA: Diagnosis present

## 2018-12-05 DIAGNOSIS — E119 Type 2 diabetes mellitus without complications: Secondary | ICD-10-CM

## 2018-12-05 DIAGNOSIS — J690 Pneumonitis due to inhalation of food and vomit: Secondary | ICD-10-CM | POA: Diagnosis not present

## 2018-12-05 DIAGNOSIS — K5730 Diverticulosis of large intestine without perforation or abscess without bleeding: Secondary | ICD-10-CM | POA: Diagnosis not present

## 2018-12-05 DIAGNOSIS — F03918 Unspecified dementia, unspecified severity, with other behavioral disturbance: Secondary | ICD-10-CM | POA: Diagnosis present

## 2018-12-05 DIAGNOSIS — J9610 Chronic respiratory failure, unspecified whether with hypoxia or hypercapnia: Secondary | ICD-10-CM | POA: Diagnosis not present

## 2018-12-05 DIAGNOSIS — R0600 Dyspnea, unspecified: Secondary | ICD-10-CM

## 2018-12-05 DIAGNOSIS — J439 Emphysema, unspecified: Secondary | ICD-10-CM | POA: Diagnosis not present

## 2018-12-05 HISTORY — DX: Unspecified hemorrhoids: K64.9

## 2018-12-05 HISTORY — DX: Diverticulosis of intestine, part unspecified, without perforation or abscess without bleeding: K57.90

## 2018-12-05 LAB — CBG MONITORING, ED
Glucose-Capillary: 87 mg/dL (ref 70–99)
Glucose-Capillary: 90 mg/dL (ref 70–99)

## 2018-12-05 LAB — TYPE AND SCREEN
ABO/RH(D): B POS
Antibody Screen: NEGATIVE

## 2018-12-05 LAB — CBC WITH DIFFERENTIAL/PLATELET
Abs Immature Granulocytes: 0.05 10*3/uL (ref 0.00–0.07)
Basophils Absolute: 0.1 10*3/uL (ref 0.0–0.1)
Basophils Relative: 1 %
Eosinophils Absolute: 0.2 10*3/uL (ref 0.0–0.5)
Eosinophils Relative: 2 %
HCT: 38.4 % (ref 36.0–46.0)
Hemoglobin: 10.7 g/dL — ABNORMAL LOW (ref 12.0–15.0)
Immature Granulocytes: 1 %
Lymphocytes Relative: 21 %
Lymphs Abs: 2 10*3/uL (ref 0.7–4.0)
MCH: 27.3 pg (ref 26.0–34.0)
MCHC: 27.9 g/dL — ABNORMAL LOW (ref 30.0–36.0)
MCV: 98 fL (ref 80.0–100.0)
Monocytes Absolute: 1 10*3/uL (ref 0.1–1.0)
Monocytes Relative: 11 %
Neutro Abs: 6.2 10*3/uL (ref 1.7–7.7)
Neutrophils Relative %: 64 %
Platelets: 194 10*3/uL (ref 150–400)
RBC: 3.92 MIL/uL (ref 3.87–5.11)
RDW: 15 % (ref 11.5–15.5)
WBC: 9.6 10*3/uL (ref 4.0–10.5)
nRBC: 0 % (ref 0.0–0.2)

## 2018-12-05 LAB — URINALYSIS, ROUTINE W REFLEX MICROSCOPIC
Bilirubin Urine: NEGATIVE
Glucose, UA: NEGATIVE mg/dL
Ketones, ur: NEGATIVE mg/dL
Leukocytes,Ua: NEGATIVE
Nitrite: NEGATIVE
Protein, ur: NEGATIVE mg/dL
Specific Gravity, Urine: >1.046 — ABNORMAL HIGH (ref 1.005–1.030)
pH: 6 (ref 5.0–8.0)

## 2018-12-05 LAB — COMPREHENSIVE METABOLIC PANEL
ALT: 27 U/L (ref 0–44)
AST: 26 U/L (ref 15–41)
Albumin: 3.5 g/dL (ref 3.5–5.0)
Alkaline Phosphatase: 110 U/L (ref 38–126)
Anion gap: 14 (ref 5–15)
BUN: 10 mg/dL (ref 8–23)
CO2: 37 mmol/L — ABNORMAL HIGH (ref 22–32)
Calcium: 9.9 mg/dL (ref 8.9–10.3)
Chloride: 89 mmol/L — ABNORMAL LOW (ref 98–111)
Creatinine, Ser: 0.69 mg/dL (ref 0.44–1.00)
GFR calc Af Amer: >60 mL/min (ref >60)
GFR calc non Af Amer: >60 mL/min (ref >60)
Glucose, Bld: 119 mg/dL — ABNORMAL HIGH (ref 70–99)
Potassium: 5.1 mmol/L (ref 3.5–5.1)
Sodium: 140 mmol/L (ref 135–145)
Total Bilirubin: 0.7 mg/dL (ref 0.3–1.2)
Total Protein: 6.7 g/dL (ref 6.5–8.1)

## 2018-12-05 LAB — POC OCCULT BLOOD, ED: Fecal Occult Bld: POSITIVE — AB

## 2018-12-05 LAB — TROPONIN I (HIGH SENSITIVITY)
Troponin I (High Sensitivity): 7 ng/L (ref <18)
Troponin I (High Sensitivity): 8 ng/L (ref <18)

## 2018-12-05 LAB — LACTIC ACID, PLASMA
Lactic Acid, Venous: 1.1 mmol/L (ref 0.5–1.9)
Lactic Acid, Venous: 1 mmol/L (ref 0.5–1.9)

## 2018-12-05 LAB — SARS CORONAVIRUS 2 BY RT PCR (HOSPITAL ORDER, PERFORMED IN ~~LOC~~ HOSPITAL LAB): SARS Coronavirus 2: NEGATIVE

## 2018-12-05 LAB — PROTIME-INR
INR: 1.5 — ABNORMAL HIGH (ref 0.8–1.2)
Prothrombin Time: 17.6 seconds — ABNORMAL HIGH (ref 11.4–15.2)

## 2018-12-05 LAB — LIPASE, BLOOD: Lipase: 30 U/L (ref 11–51)

## 2018-12-05 MED ORDER — SODIUM CHLORIDE 0.9 % IV BOLUS
500.0000 mL | Freq: Once | INTRAVENOUS | Status: AC
  Administered 2018-12-05: 500 mL via INTRAVENOUS

## 2018-12-05 MED ORDER — SODIUM CHLORIDE 0.9 % IV SOLN
INTRAVENOUS | Status: DC
  Administered 2018-12-05: 20:00:00 via INTRAVENOUS

## 2018-12-05 MED ORDER — ACETAMINOPHEN 325 MG PO TABS
650.0000 mg | ORAL_TABLET | Freq: Four times a day (QID) | ORAL | Status: DC | PRN
  Administered 2018-12-06 – 2018-12-10 (×4): 650 mg via ORAL
  Filled 2018-12-05 (×5): qty 2

## 2018-12-05 MED ORDER — IOHEXOL 300 MG/ML  SOLN
100.0000 mL | Freq: Once | INTRAMUSCULAR | Status: AC | PRN
  Administered 2018-12-05: 100 mL via INTRAVENOUS

## 2018-12-05 MED ORDER — ACETAMINOPHEN 650 MG RE SUPP: 650.0000 mg | Freq: Four times a day (QID) | RECTAL | Status: DC | PRN

## 2018-12-05 MED ORDER — INSULIN ASPART 100 UNIT/ML ~~LOC~~ SOLN
0.0000 [IU] | SUBCUTANEOUS | Status: DC
  Administered 2018-12-06: 21:00:00 2 [IU] via SUBCUTANEOUS
  Administered 2018-12-06: 1 [IU] via SUBCUTANEOUS

## 2018-12-05 MED ORDER — ACETAMINOPHEN 325 MG PO TABS
650.0000 mg | ORAL_TABLET | Freq: Once | ORAL | Status: AC
  Administered 2018-12-05: 19:00:00 650 mg via ORAL
  Filled 2018-12-05: qty 2

## 2018-12-05 NOTE — ED Triage Notes (Signed)
Bright red blood in stool and pain to rectum that started today.  Pt has had this problem before with her hemorrhoids.

## 2018-12-05 NOTE — ED Notes (Signed)
Pt reports that she has been passing blood and clots from her rectum for at least one month

## 2018-12-05 NOTE — ED Notes (Signed)
Purewick placed on pt. 

## 2018-12-05 NOTE — ED Provider Notes (Signed)
[facility] EMERGENCY DEPARTMENT Provider Note   CSN: [id number] Arrival date & time: 12/05/18  1259     History   Chief Complaint Chief Complaint  Patient presents with   Blood In Stools    HPI Eileen [name] Eileen [name] is [name] 73 y.o. female.     HPI  Pt was seen at 1510. Per pt, c/o gradual onset and persistence of multiple intermittent episodes of "passing blood in stools" for the past 1 week. Pt states she has "seen some blood" in her stools over the past 1 month. Pt describes the stools as "red blood with clots."  Denies abd pain, no N/V/D, no CP/SOB, no back pain, no fevers.     Past Medical History:  Diagnosis Date   Asthma    Atrial flutter ([place])    Cancer ([place])    Right breast   COPD (chronic obstructive pulmonary disease) (HCC)    Dementia (HCC)    Diverticulosis    Hemorrhoids    History of breast cancer    right breast   Hypertension    On home O2    Type 2 diabetes mellitus ([place])     Patient Active Problem List   Diagnosis Date Noted   Aspiration pneumonia ([place]) 11/19/2018   Dysphagia 11/19/2018   Acute respiratory failure with hypoxia and hypercapnia ([place]) 11/16/2018   Chronic obstructive bronchitis (HCC)    Dementia with behavioral disturbance (HCC)    Goals of care, counseling/discussion    Advanced care planning/counseling discussion    Palliative care by specialist    Bronchiectasis with acute exacerbation ([place])    COPD exacerbation ([place]) 10/18/2018   Anemia 10/18/2018   Osteoporosis 08/30/2018   Closed compression fracture of body of L1 vertebra (HCC)    Constipation    Acute and chronic respiratory failure (acute-on-chronic) ([place]) 08/10/2018   Left lower lobe pneumonia ([place]) 08/10/2018   Leukocytosis 08/10/2018   Chronic respiratory failure with hypoxia ([place]) 06/28/2018   Acute upper GI bleeding 01/20/2018   Lower GI bleed 01/20/2018   Acute respiratory failure with hypoxia ([place]) 09/17/2017   Diabetes mellitus  ([place]) 08/31/2017   Acute hypoxemic respiratory failure ([place]) 08/31/2017   Hemorrhoids 10/30/2016   Atrial fibrillation with RVR ([place]) 09/24/2016   Atrial flutter ([place]) 09/23/2016   Obstructive chronic bronchitis with exacerbation ([place]) 05/23/2016   Vitamin D deficiency 05/23/2016   Acute on chronic respiratory failure ([place]) Q000111Q   Metabolic encephalopathy Q000111Q   HCAP (healthcare-associated pneumonia) 03/24/2015   Anticoagulation management encounter    New onset atrial flutter ([place]) 03/21/2015   Obesity 03/21/2015   Atrial flutter with rapid ventricular response ([place]) 03/21/2015   COPD (chronic obstructive pulmonary disease) ([place]) 12/20/2014   COPD with acute exacerbation ([place]) 12/20/2014   DCIS (ductal carcinoma in situ) of breast, right, 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, Arimadex 03/10/2011   Breast cancer ([place]) 10/11/2009    Past Surgical History:  Procedure Laterality Date   BREAST SURGERY  2011   Right breast mastectomy   CARDIOVERSION N/[name] 06/27/2018   Procedure: CARDIOVERSION;  Surgeon: Arnoldo Lenis, MD;  Location: [facility] END[facility];  Service: Endoscopy;  Laterality: N/[name];   CESAREAN SECTION     COLONOSCOPY N/[name] 01/22/2018   Procedure: COLONOSCOPY;  Surgeon: Rogene Houston, MD;  Location: [facility] END[facility];  Service: Endoscopy;  Laterality: N/[name];   HERNIA REPAIR     RIH   IR VERTEBROPLASTY LUMBAR BX INC UNI/BIL INC/INJECT/IMAGING  09/05/2018   MASTECTOMY  2011   right  breast   TEE WITHOUT CARDIOVERSION N/[name] 06/27/2018   Procedure: TRANSESOPHAGEAL ECHOCARDIOGRAM (TEE) WITH PROPOFOL;  Surgeon: Arnoldo Lenis, MD;  Location: [facility];  Service: Endoscopy;  Laterality: N/[name];     OB History    Gravida      Para      Term      Preterm      AB      Living  5     SAB      TAB      Ectopic      Multiple      Live Births               Home Medications    Prior to Admission medications   Medication Sig Start Date End  Date Taking? Authorizing Provider  acetaminophen (TYLENOL) 500 MG tablet Take 500 mg by mouth every 8 (eight) hours as needed for mild pain or headache.     Lavella Lemons, PA  albuterol (PROVENTIL) (2.5 MG/3ML) 0.083% nebulizer solution Take 2.5 mg by nebulization every 6 (six) hours as needed for wheezing or shortness of breath.    [provider]  furosemide (LASIX) 40 MG tablet Take 1.5 tablets (60 mg total) by mouth daily. 11/11/18   Manuella Ghazi, Pratik D, DO  metFORMIN (GLUCOPHAGE-XR) 500 MG 24 hr tablet Take 500 mg by mouth daily with breakfast.  08/10/17   [provider]  metoprolol tartrate (LOPRESSOR) 50 MG tablet Take 50 mg by mouth 2 (two) times daily. 04/12/18   [provider]  polyethylene glycol (MIRALAX / GLYCOLAX) 17 g packet Take 17 g by mouth daily as needed for mild constipation. 08/22/18   Barton Dubois, MD  potassium chloride SA (K-DUR) 20 MEQ tablet Take 20 mEq by mouth 2 (two) times daily.    [provider]  PROAIR HFA 108 (90 BASE) MCG/ACT inhaler Inhale 2 puffs into the lungs every 6 (six) hours as needed for wheezing or shortness of breath.  03/06/11   [provider]  QUEtiapine (SEROQUEL) 25 MG tablet Take 1 tablet (25 mg total) by mouth daily at 6 PM. 11/11/18 12/11/18  Manuella Ghazi, Pratik D, DO  rivaroxaban (XARELTO) 20 MG TABS tablet Take 1 tablet (20 mg total) by mouth daily with supper. Stop Xarelto until Monday (08/26/18) in anticipation for kyphoplasty 08/22/18   Barton Dubois, MD  TRELEGY ELLIPTA 100-62.5-25 MCG/INH AEPB Take 1 puff by mouth daily. 08/27/17   [provider]    Family History Family History  Problem Relation Age of Onset   Cancer Mother        lung   Diabetes Brother     Social History Social History   Tobacco Use   Smoking status: Former Smoker    Packs/day: 0.50    Years: 32.00    Pack years: 16.00    Types: Cigarettes    Start date: 12/12/1962    Quit date: 03/13/1994    Years since  quitting: 24.7   Smokeless tobacco: Never Used  Substance Use Topics   Alcohol use: No    Alcohol/week: 0.0 standard drinks   Drug use: No     Allergies   Codeine   Review of Systems Review of Systems ROS: Statement: All systems negative except as marked or noted in the HPI; Constitutional: Negative for fever and chills. ; ; Eyes: Negative for eye pain, redness and discharge. ; ; ENMT: Negative for ear pain, hoarseness, nasal congestion, sinus pressure and sore  throat. ; ; Cardiovascular: Negative for chest pain, palpitations, diaphoresis, dyspnea and peripheral edema. ; ; Respiratory: Negative for cough, wheezing and stridor. ; ; Gastrointestinal: Negative for nausea, vomiting, diarrhea, abdominal pain, hematemesis, jaundice and +rectal bleeding. . ; ; Genitourinary: Negative for dysuria, flank pain and hematuria. ; ; Musculoskeletal: Negative for back pain and neck pain. Negative for swelling and trauma.; ; Skin: Negative for pruritus, rash, abrasions, blisters, bruising and skin lesion.; ; Neuro: Negative for headache, lightheadedness and neck stiffness. Negative for weakness, altered level of consciousness, altered mental status, extremity weakness, paresthesias, involuntary movement, seizure and syncope.       Physical Exam Updated Vital Signs BP (!) 103/92    Pulse 87    Resp (!) 21    Ht 5\' 2"  (1.575 m)    Wt 63.5 kg    SpO2 92%    BMI 25.61 kg/m    17:09:42 Orthostatic Vital Signs RG  Orthostatic Lying   BP- Lying: 104/69  Pulse- Lying: 72      Orthostatic Sitting  BP- Sitting: 92/69  Pulse- Sitting: 73      Orthostatic Standing at 3 minutes  BP- Standing at 3 minutes: 97/64  Pulse- Standing at 3 minutes: 90  17:10:41 ED Notes RG  Pt desaturated while standing to 70%. Increased o2 to 5L via [place] with increase of sats to 95 %     Physical Exam 1515: Physical examination:  Nursing notes reviewed; Vital signs and O2 SAT reviewed;  Constitutional: Well developed,  Well nourished, Well hydrated, In no acute distress; Head:  Normocephalic, atraumatic; Eyes: EOMI, PERRL, No scleral icterus; ENMT: Mouth and pharynx normal, Mucous membranes moist; Neck: Supple, Full range of motion, No lymphadenopathy; Cardiovascular: Regular rate and rhythm, No gallop; Respiratory: Breath sounds clear & equal bilaterally, No wheezes.  Speaking full sentences with ease, Normal respiratory effort/excursion; Chest: Nontender, Movement normal; Abdomen: Soft, Nontender, Nondistended, Normal bowel sounds; Genitourinary: No CVA tenderness; Extremities: Peripheral pulses normal, No tenderness, No edema, No calf edema or asymmetry.; Neuro: AA&Ox3, Major CN grossly intact.  Speech clear. No gross focal motor or sensory deficits in extremities.; Skin: Color normal, Warm, Dry.    ED Treatments / Results  Labs (all labs ordered are listed, but only abnormal results are displayed)   EKG EKG Interpretation  Date/Time:  Thursday December 05 2018 16:30:35 EDT Ventricular Rate:  91 PR Interval:    QRS Duration: 103 QT Interval:  362 QTC Calculation: 446 R Axis:   159 Text Interpretation:  Sinus rhythm Anteroseptal infarct, age indeterminate Baseline wander When compared with ECG of 11/15/2018 No significant change was found Confirmed by Francine Graven [phone]) on 12/05/2018 5:05:49 PM   Radiology   Procedures Procedures (including critical care time)  Medications Ordered in ED Medications - No data to display   Initial Impression / Assessment and Plan / ED Course  I have reviewed the triage vital signs and the nursing notes.  Pertinent labs & imaging results that were available during my care of the patient were reviewed by me and considered in my medical decision making (see chart for details).     MDM Reviewed: previous chart, nursing note and vitals Reviewed previous: labs and ECG Interpretation: labs, ECG, x-ray and CT scan    Results for orders placed or performed  during the hospital encounter of 12/05/18  Comprehensive metabolic panel  Result Value Ref Range   Sodium 140 135 - 145 mmol/L   Potassium 5.1 3.5 - 5.1 mmol/L  Chloride 89 (L) 98 - 111 mmol/L   CO2 37 (H) 22 - 32 mmol/L   Glucose, Bld 119 (H) 70 - 99 mg/dL   BUN 10 8 - 23 mg/dL   Creatinine, Ser 0.69 0.44 - 1.00 mg/dL   Calcium 9.9 8.9 - 10.3 mg/dL   Total Protein 6.7 6.5 - 8.1 g/dL   Albumin 3.5 3.5 - 5.0 g/dL   AST 26 15 - 41 U/L   ALT 27 0 - 44 U/L   Alkaline Phosphatase 110 38 - 126 U/L   Total Bilirubin 0.7 0.3 - 1.2 mg/dL   GFR calc non Af Amer >60 >60 mL/min   GFR calc Af Amer >60 >60 mL/min   Anion gap 14 5 - 15  Lipase, blood  Result Value Ref Range   Lipase 30 11 - 51 U/L  Lactic acid, plasma  Result Value Ref Range   Lactic Acid, Venous 1.1 0.5 - 1.9 mmol/L  CBC with Differential  Result Value Ref Range   WBC 9.6 4.0 - 10.5 K/uL   RBC 3.92 3.87 - 5.11 MIL/uL   Hemoglobin 10.7 (L) 12.0 - 15.0 g/dL   HCT 38.4 36.0 - 46.0 %   MCV 98.0 80.0 - 100.0 fL   MCH 27.3 26.0 - 34.0 pg   MCHC 27.9 (L) 30.0 - 36.0 g/dL   RDW 15.0 11.5 - 15.5 %   Platelets 194 150 - 400 K/uL   nRBC 0.0 0.0 - 0.2 %   Neutrophils Relative % 64 %   Neutro Abs 6.2 1.7 - 7.7 K/uL   Lymphocytes Relative 21 %   Lymphs Abs 2.0 0.7 - 4.0 K/uL   Monocytes Relative 11 %   Monocytes Absolute 1.0 0.1 - 1.0 K/uL   Eosinophils Relative 2 %   Eosinophils Absolute 0.2 0.0 - 0.5 K/uL   Basophils Relative 1 %   Basophils Absolute 0.1 0.0 - 0.1 K/uL   Immature Granulocytes 1 %   Abs Immature Granulocytes 0.05 0.00 - 0.07 K/uL  Protime-INR  Result Value Ref Range   Prothrombin Time 17.6 (H) 11.4 - 15.2 seconds   INR 1.5 (H) 0.8 - 1.2  POC occult blood, ED RN will collect  Result Value Ref Range   Fecal Occult Bld POSITIVE ([name]) NEGATIVE  Type and screen [facility]  Result Value Ref Range   ABO/RH(D) B POS    Antibody Screen NEG    Sample Expiration      12/08/2018,2359 Performed at  [facility], [address] [place], [place] [address]   Troponin I (High Sensitivity)  Result Value Ref Range   Troponin I (High Sensitivity) 7 <18 ng/L  Troponin I (High Sensitivity)  Result Value Ref Range   Troponin I (High Sensitivity) 8 <18 ng/L    Dg Chest 2 View Result Date: 12/05/2018 CLINICAL DATA:  Bright red blood in the stool and rectal pain beginning today. History of hemorrhoids. EXAM: CHEST - 2 VIEW COMPARISON:  11/19/2018 FINDINGS: Cardiac silhouette is mildly enlarged. No mediastinal or hilar masses. No evidence of adenopathy. Chronic prominence of the bronchovascular markings. Additional linear lung base opacities consistent with scarring or atelectasis. No evidence of pneumonia or pulmonary edema. No pleural effusion or pneumothorax. Previously treated compression fracture at the thoracolumbar junction. No acute skeletal abnormality. IMPRESSION: No acute cardiopulmonary disease. Electronically Signed   By: Lajean Manes M.D.   On: 12/05/2018 18:02    Ct Abdomen Pelvis W Contrast Result Date: 12/05/2018 CLINICAL DATA:  73 year old female with [name] history of GI bleed EXAM: CT ABDOMEN AND PELVIS WITH CONTRAST TECHNIQUE: Multidetector CT imaging of the abdomen and pelvis was performed using the standard protocol following bolus administration of intravenous contrast. CONTRAST:  153mL OMNIPAQUE IOHEXOL 300 MG/ML  SOLN COMPARISON:  04/21/2018 FINDINGS: Lower chest: Scarring/atelectasis at the lung bases. Hepatobiliary: Unremarkable liver.  Unremarkable gallbladder. Pancreas: Unremarkable Spleen: Unremarkable Adrenals/Urinary Tract: Unremarkable appearance of the bilateral adrenal glands. Right kidney with no hydronephrosis or nephrolithiasis. Bosniak 1 cyst on the medial cortex of the right kidney on image 22. Additional subcentimeter low-density lesion on the anterior cortex of the right kidney on image 27 too small to characterize. Left kidney without hydronephrosis. Nonobstructive  nephrolithiasis at the inferior collecting system, with stone measuring approximately 4 mm. Bosniak 1 cyst on the posterior cortex of the left kidney on image 26. Bosniak 1 cyst on the medial anterior cortex of the left kidney on image 28. There are additional low-density lesions which are too small to characterize. Urinary bladder is relatively decompressed. Stomach/Bowel: Small hiatal hernia. Otherwise unremarkable stomach. Small bowel unremarkable. No abnormal distention. No focal wall thickening or inflammatory changes. No transition point. Normal appendix. Mild stool burden. Colonic diverticular disease throughout the colon. Retained contrast or inspissated secretions within numerous diverticula. No focal inflammatory changes are evident. No fluid within the bowel or contrast accumulation. Vascular/Lymphatic: Atherosclerotic changes of the abdominal aorta. No aneurysm or dissection. Mesenteric arteries and renal arteries are patent. Bilateral iliac arteries and proximal femoral arteries are patent. No adenopathy. Reproductive: Focal low-density fluid or tissue within the endometrial canal on image 58. Unremarkable left and right ovary. Other: Fat containing umbilical hernia with ventral diastasis. Small loop of small bowel at this site without entrapment. Musculoskeletal: Fracture of L1, treated with vertebral augmentation. Degree of posterior retropulsion similar to the comparison. Are no acute displaced fracture. IMPRESSION: No acute CT finding of the abdomen, with no CT evidence to identify source of GI hemorrhage. Colonic diverticula without evidence of acute inflammation. Hiatal hernia. Trace fluid or tissue within the endometrial canal, unexpected in [name] patient of this age. Referral for gynecologic follow-up is recommended, and potentially evaluation with ultrasound or MRI as early malignancy cannot be excluded. Aortic Atherosclerosis (ICD10-I70.0). Left-sided nonobstructive nephrolithiasis. Electronically  Signed   By: Corrie Mckusick D.O.   On: 12/05/2018 18:05      Results for Speciale, Eileen [name] (MRN [id number]) as of 12/05/2018 17:07  Ref. Range 11/10/2018 00:21 11/10/2018 05:03 11/11/2018 04:48 11/15/2018 23:30 11/16/2018 05:08 11/18/2018 04:34 11/19/2018 06:07 11/20/2018 04:18 11/21/2018 03:53 11/22/2018 04:54 12/05/2018 15:15  Hemoglobin Latest Ref Range: 12.0 - 15.0 g/dL 11.0 (L) 11.6 (L) 12.0 10.6 (L) 9.9 (L) 9.7 (L) 9.1 (L) 9.4 (L) 10.1 (L) 9.9 (L) 10.7 (L)  HCT Latest Ref Range: 36.0 - 46.0 % 38.6 41.0 43.7 39.1 36.1 34.9 (L) 32.7 (L) 33.5 (L) 37.0 34.9 (L) 38.4    Eileen [name] Grigoryan was evaluated in Emergency Department on 12/05/2018 for the symptoms described in the history of present illness. She was evaluated in the context of the global COVID-19 pandemic, which necessitated consideration that the patient might be at risk for infection with the SARS-CoV-2 virus that causes COVID-19. Institutional protocols and algorithms that pertain to the evaluation of patients at risk for COVID-19 are in [name] state of rapid change based on information released by regulatory bodies including the CDC and federal and state organizations. These policies and algorithms were followed during the patient's care in the ED.  1910:  H/H per baseline. Judicious IVF bolus given for orthostatic hypotension. Pt only had one stool on arrival to ED; none since.  Will admit. T/C returned from GI Dr. Oneida Alar, case discussed, including:  HPI, pertinent PM/SHx, VS/PE, dx testing, ED course and treatment:  Agreeable to have Dr. Laural Golden consult tomorrow.   1920:  T/C returned from [facility] Dr. Maudie Mercury, case discussed, including:  HPI, pertinent PM/SHx, VS/PE, dx testing, ED course and treatment, as well as GI MD:  Agreeable to admit.       Final Clinical Impressions(s) / ED Diagnoses   Final diagnoses:  None    ED Discharge Orders    None       Francine Graven, DO 12/10/18 1106

## 2018-12-05 NOTE — ED Notes (Signed)
Pt desaturated while standing to 70%. Increased o2 to 5L via  with increase of sats to 95 %

## 2018-12-06 DIAGNOSIS — Z9011 Acquired absence of right breast and nipple: Secondary | ICD-10-CM | POA: Diagnosis not present

## 2018-12-06 DIAGNOSIS — J441 Chronic obstructive pulmonary disease with (acute) exacerbation: Secondary | ICD-10-CM | POA: Diagnosis not present

## 2018-12-06 DIAGNOSIS — E1165 Type 2 diabetes mellitus with hyperglycemia: Secondary | ICD-10-CM | POA: Diagnosis present

## 2018-12-06 DIAGNOSIS — K5731 Diverticulosis of large intestine without perforation or abscess with bleeding: Secondary | ICD-10-CM | POA: Diagnosis not present

## 2018-12-06 DIAGNOSIS — J9601 Acute respiratory failure with hypoxia: Secondary | ICD-10-CM | POA: Diagnosis not present

## 2018-12-06 DIAGNOSIS — Z87891 Personal history of nicotine dependence: Secondary | ICD-10-CM | POA: Diagnosis not present

## 2018-12-06 DIAGNOSIS — Z20828 Contact with and (suspected) exposure to other viral communicable diseases: Secondary | ICD-10-CM | POA: Diagnosis present

## 2018-12-06 DIAGNOSIS — Z7984 Long term (current) use of oral hypoglycemic drugs: Secondary | ICD-10-CM | POA: Diagnosis not present

## 2018-12-06 DIAGNOSIS — J9602 Acute respiratory failure with hypercapnia: Secondary | ICD-10-CM | POA: Diagnosis not present

## 2018-12-06 DIAGNOSIS — I471 Supraventricular tachycardia: Secondary | ICD-10-CM | POA: Diagnosis not present

## 2018-12-06 DIAGNOSIS — J439 Emphysema, unspecified: Secondary | ICD-10-CM | POA: Diagnosis not present

## 2018-12-06 DIAGNOSIS — K922 Gastrointestinal hemorrhage, unspecified: Secondary | ICD-10-CM | POA: Diagnosis not present

## 2018-12-06 DIAGNOSIS — J9611 Chronic respiratory failure with hypoxia: Secondary | ICD-10-CM | POA: Diagnosis not present

## 2018-12-06 DIAGNOSIS — K921 Melena: Secondary | ICD-10-CM | POA: Diagnosis not present

## 2018-12-06 DIAGNOSIS — I951 Orthostatic hypotension: Secondary | ICD-10-CM | POA: Diagnosis present

## 2018-12-06 DIAGNOSIS — F0391 Unspecified dementia with behavioral disturbance: Secondary | ICD-10-CM | POA: Diagnosis present

## 2018-12-06 DIAGNOSIS — Z8701 Personal history of pneumonia (recurrent): Secondary | ICD-10-CM | POA: Diagnosis not present

## 2018-12-06 DIAGNOSIS — K625 Hemorrhage of anus and rectum: Secondary | ICD-10-CM | POA: Diagnosis not present

## 2018-12-06 DIAGNOSIS — J9811 Atelectasis: Secondary | ICD-10-CM | POA: Diagnosis not present

## 2018-12-06 DIAGNOSIS — Z885 Allergy status to narcotic agent status: Secondary | ICD-10-CM | POA: Diagnosis not present

## 2018-12-06 DIAGNOSIS — J9610 Chronic respiratory failure, unspecified whether with hypoxia or hypercapnia: Secondary | ICD-10-CM | POA: Diagnosis not present

## 2018-12-06 DIAGNOSIS — Z833 Family history of diabetes mellitus: Secondary | ICD-10-CM | POA: Diagnosis not present

## 2018-12-06 DIAGNOSIS — F0281 Dementia in other diseases classified elsewhere with behavioral disturbance: Secondary | ICD-10-CM | POA: Diagnosis not present

## 2018-12-06 DIAGNOSIS — Z23 Encounter for immunization: Secondary | ICD-10-CM | POA: Diagnosis not present

## 2018-12-06 DIAGNOSIS — Z79899 Other long term (current) drug therapy: Secondary | ICD-10-CM | POA: Diagnosis not present

## 2018-12-06 DIAGNOSIS — J690 Pneumonitis due to inhalation of food and vomit: Secondary | ICD-10-CM | POA: Diagnosis not present

## 2018-12-06 DIAGNOSIS — J449 Chronic obstructive pulmonary disease, unspecified: Secondary | ICD-10-CM | POA: Diagnosis not present

## 2018-12-06 DIAGNOSIS — D638 Anemia in other chronic diseases classified elsewhere: Secondary | ICD-10-CM | POA: Diagnosis present

## 2018-12-06 DIAGNOSIS — R0902 Hypoxemia: Secondary | ICD-10-CM | POA: Diagnosis not present

## 2018-12-06 DIAGNOSIS — I4892 Unspecified atrial flutter: Secondary | ICD-10-CM | POA: Diagnosis not present

## 2018-12-06 DIAGNOSIS — Z9981 Dependence on supplemental oxygen: Secondary | ICD-10-CM | POA: Diagnosis not present

## 2018-12-06 DIAGNOSIS — I4891 Unspecified atrial fibrillation: Secondary | ICD-10-CM | POA: Diagnosis not present

## 2018-12-06 DIAGNOSIS — I10 Essential (primary) hypertension: Secondary | ICD-10-CM | POA: Diagnosis present

## 2018-12-06 DIAGNOSIS — K5909 Other constipation: Secondary | ICD-10-CM | POA: Diagnosis present

## 2018-12-06 DIAGNOSIS — E876 Hypokalemia: Secondary | ICD-10-CM | POA: Diagnosis not present

## 2018-12-06 DIAGNOSIS — Z853 Personal history of malignant neoplasm of breast: Secondary | ICD-10-CM | POA: Diagnosis not present

## 2018-12-06 LAB — GLUCOSE, CAPILLARY
Glucose-Capillary: 96 mg/dL (ref 70–99)
Glucose-Capillary: 125 mg/dL — ABNORMAL HIGH (ref 70–99)
Glucose-Capillary: 158 mg/dL — ABNORMAL HIGH (ref 70–99)

## 2018-12-06 LAB — CBC
HCT: 33 % — ABNORMAL LOW (ref 36.0–46.0)
Hemoglobin: 9 g/dL — ABNORMAL LOW (ref 12.0–15.0)
MCH: 26.9 pg (ref 26.0–34.0)
MCHC: 27.3 g/dL — ABNORMAL LOW (ref 30.0–36.0)
MCV: 98.8 fL (ref 80.0–100.0)
Platelets: 159 10*3/uL (ref 150–400)
RBC: 3.34 MIL/uL — ABNORMAL LOW (ref 3.87–5.11)
RDW: 14.8 % (ref 11.5–15.5)
WBC: 7.2 10*3/uL (ref 4.0–10.5)
nRBC: 0 % (ref 0.0–0.2)

## 2018-12-06 LAB — COMPREHENSIVE METABOLIC PANEL
ALT: 21 U/L (ref 0–44)
AST: 21 U/L (ref 15–41)
Albumin: 2.8 g/dL — ABNORMAL LOW (ref 3.5–5.0)
Alkaline Phosphatase: 83 U/L (ref 38–126)
Anion gap: 8 (ref 5–15)
BUN: 11 mg/dL (ref 8–23)
CO2: 38 mmol/L — ABNORMAL HIGH (ref 22–32)
Calcium: 8.9 mg/dL (ref 8.9–10.3)
Chloride: 95 mmol/L — ABNORMAL LOW (ref 98–111)
Creatinine, Ser: 0.55 mg/dL (ref 0.44–1.00)
GFR calc Af Amer: >60 mL/min (ref >60)
GFR calc non Af Amer: >60 mL/min (ref >60)
Glucose, Bld: 168 mg/dL — ABNORMAL HIGH (ref 70–99)
Potassium: 4.2 mmol/L (ref 3.5–5.1)
Sodium: 141 mmol/L (ref 135–145)
Total Bilirubin: 0.5 mg/dL (ref 0.3–1.2)
Total Protein: 5.4 g/dL — ABNORMAL LOW (ref 6.5–8.1)

## 2018-12-06 LAB — CBG MONITORING, ED
Glucose-Capillary: 133 mg/dL — ABNORMAL HIGH (ref 70–99)
Glucose-Capillary: 186 mg/dL — ABNORMAL HIGH (ref 70–99)
Glucose-Capillary: 67 mg/dL — ABNORMAL LOW (ref 70–99)
Glucose-Capillary: 78 mg/dL (ref 70–99)

## 2018-12-06 MED ORDER — FLUTICASONE-UMECLIDIN-VILANT 100-62.5-25 MCG/INH IN AEPB: 1.0000 | INHALATION_SPRAY | Freq: Every day | RESPIRATORY_TRACT | Status: DC

## 2018-12-06 MED ORDER — IPRATROPIUM-ALBUTEROL 0.5-2.5 (3) MG/3ML IN SOLN
3.0000 mL | Freq: Four times a day (QID) | RESPIRATORY_TRACT | Status: DC
  Administered 2018-12-06: 3 mL via RESPIRATORY_TRACT
  Filled 2018-12-06: qty 3

## 2018-12-06 MED ORDER — DEXTROSE 50 % IV SOLN
INTRAVENOUS | Status: AC
  Filled 2018-12-06: qty 50

## 2018-12-06 MED ORDER — DEXTROSE 50 % IV SOLN
1.0000 | Freq: Once | INTRAVENOUS | Status: AC
  Administered 2018-12-06: 05:00:00 50 mL via INTRAVENOUS

## 2018-12-06 MED ORDER — IPRATROPIUM-ALBUTEROL 0.5-2.5 (3) MG/3ML IN SOLN
3.0000 mL | Freq: Three times a day (TID) | RESPIRATORY_TRACT | Status: DC
  Administered 2018-12-06 – 2018-12-11 (×16): 3 mL via RESPIRATORY_TRACT
  Filled 2018-12-06 (×16): qty 3

## 2018-12-06 MED ORDER — ALBUTEROL SULFATE HFA 108 (90 BASE) MCG/ACT IN AERS: 2.0000 | INHALATION_SPRAY | Freq: Four times a day (QID) | RESPIRATORY_TRACT | Status: DC | PRN

## 2018-12-06 MED ORDER — BUDESONIDE 0.5 MG/2ML IN SUSP
0.5000 mg | Freq: Two times a day (BID) | RESPIRATORY_TRACT | Status: DC
  Administered 2018-12-06 – 2018-12-11 (×10): 0.5 mg via RESPIRATORY_TRACT
  Filled 2018-12-06 (×10): qty 2

## 2018-12-06 MED ORDER — UMECLIDINIUM BROMIDE 62.5 MCG/INH IN AEPB
1.0000 | INHALATION_SPRAY | Freq: Every day | RESPIRATORY_TRACT | Status: DC
  Filled 2018-12-06: qty 7

## 2018-12-06 MED ORDER — FLUTICASONE FUROATE-VILANTEROL 100-25 MCG/INH IN AEPB
1.0000 | INHALATION_SPRAY | Freq: Every day | RESPIRATORY_TRACT | Status: DC
  Filled 2018-12-06: qty 28

## 2018-12-06 MED ORDER — QUETIAPINE FUMARATE 25 MG PO TABS: 25.0000 mg | ORAL_TABLET | Freq: Every day | ORAL | Status: DC

## 2018-12-06 MED ORDER — QUETIAPINE FUMARATE 25 MG PO TABS
25.0000 mg | ORAL_TABLET | Freq: Every day | ORAL | Status: DC
  Administered 2018-12-06 – 2018-12-09 (×4): 25 mg via ORAL
  Filled 2018-12-06 (×6): qty 1

## 2018-12-06 MED ORDER — POLYETHYLENE GLYCOL 3350 17 G PO PACK: 17.0000 g | PACK | Freq: Every day | ORAL | Status: DC | PRN

## 2018-12-06 MED ORDER — METOPROLOL TARTRATE 25 MG PO TABS
25.0000 mg | ORAL_TABLET | Freq: Two times a day (BID) | ORAL | Status: DC
  Administered 2018-12-07: 09:00:00 25 mg via ORAL
  Filled 2018-12-06 (×3): qty 1

## 2018-12-06 MED ORDER — DEXTROSE-NACL 5-0.9 % IV SOLN
INTRAVENOUS | Status: DC
  Administered 2018-12-06: 05:00:00 via INTRAVENOUS

## 2018-12-06 MED ORDER — ALBUTEROL SULFATE (2.5 MG/3ML) 0.083% IN NEBU
2.5000 mg | INHALATION_SOLUTION | Freq: Four times a day (QID) | RESPIRATORY_TRACT | Status: DC | PRN
  Filled 2018-12-06: qty 3

## 2018-12-06 NOTE — Consult Note (Signed)
Referring Provider: Orson Eva, DO Primary Care Physician:  Lavella Lemons, PA Primary Gastroenterologist:  Dr. Laural Golden  Reason for Consultation:   Rectal bleeding.  Patient is on rivaroxaban.  HPI:   Patient is 73 year old Afro-American female with multiple medical problems including atrial flutter on rivaroxaban, COPD dementia diabetes mellitus who was recently hospitalized for exacerbation of COPD 2 weeks ago.  Patient is on home O2.  Patient was in usual state of health until yesterday morning when had a bowel movement with rectal discomfort and passed bright red blood with clots.  She apparently had multiple episodes and she was brought to emergency room for evaluation.  Patient was evaluated by Dr. Thurnell Garbe.  Her H&H was 10.7 and 38.4.  2 weeks ago prior to discharge her H&H was 9.9 and 34.9. Patient was admitted to Dr. Carles Collet service.  Her hemoglobin from this morning was 9.0.  Patient has been in the room for about 6 hours and has not had any more bloody bowel movements.  She did experience some rectal pain.  Now she complains of pain over her epigastrium where she has large hernia of linea alba.  She has not experienced nausea vomiting.  She has chronic constipation and takes polyethylene glycol on as-needed basis. She says her appetite has been good and she has not lost any weight recently.  Patient believes she took anticoagulant on the evening of 12/04/2018. She lives at home with her daughter [name].  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  She is not married.  She has 5 children.  She has 3 daughters and 2 sons.  Patient was hospitalized in November 2019 with rectal bleeding.  She underwent colonoscopy by me on 01/22/2018 and was found to have sigmoid diverticulosis internal and external hemorrhoids.  I felt that she most likely bled from colonic diverticulosis.   Past Medical History:  Diagnosis Date  . Asthma   . Atrial flutter ([place])   . Cancer [facility])    Right breast  .  COPD (chronic obstructive pulmonary disease) ([place])   . Dementia ([place])   . Diverticulosis   . Hemorrhoids   . History of breast cancer    right breast  . Hypertension   . On home O2   . Type 2 diabetes mellitus ([place])     Past Surgical History:  Procedure Laterality Date  . BREAST SURGERY  2011   Right breast mastectomy  . CARDIOVERSION N/A 06/27/2018   Procedure: CARDIOVERSION;  Surgeon: Arnoldo Lenis, MD;  Location: [facility];  Service: Endoscopy;  Laterality: N/A;  . CESAREAN SECTION    . COLONOSCOPY N/A 01/22/2018   Procedure: COLONOSCOPY;  Surgeon: Rogene Houston, MD;  Location: [facility];  Service: Endoscopy;  Laterality: N/A;  . HERNIA REPAIR     RIH  . IR VERTEBROPLASTY LUMBAR BX INC UNI/BIL INC/INJECT/IMAGING  09/05/2018  . MASTECTOMY  2011   right breast  . TEE WITHOUT CARDIOVERSION N/A 06/27/2018   Procedure: TRANSESOPHAGEAL ECHOCARDIOGRAM (TEE) WITH PROPOFOL;  Surgeon: Arnoldo Lenis, MD;  Location: [facility];  Service: Endoscopy;  Laterality: N/A;    Prior to Admission medications   Medication Sig Start Date End Date Taking? Authorizing Provider  acetaminophen (TYLENOL) 500 MG tablet Take 500 mg by mouth every 8 (eight) hours as needed for mild pain or headache.    Yes Lavella Lemons, PA  albuterol (PROVENTIL) (2.5 MG/3ML) 0.083% nebulizer solution Take 2.5 mg by nebulization every  6 (six) hours as needed for wheezing or shortness of breath.   Yes [provider]  furosemide (LASIX) 40 MG tablet Take 1.5 tablets (60 mg total) by mouth daily. Patient taking differently: Take 40 mg by mouth daily as needed for fluid.  11/11/18  Yes Manuella Ghazi, Pratik D, DO  metFORMIN (GLUCOPHAGE-XR) 500 MG 24 hr tablet Take 500 mg by mouth daily with breakfast.  08/10/17  Yes [provider]  metoprolol tartrate (LOPRESSOR) 50 MG tablet Take 50 mg by mouth 2 (two) times daily. 04/12/18  Yes [provider]  polyethylene glycol (MIRALAX / GLYCOLAX)  17 g packet Take 17 g by mouth daily as needed for mild constipation. 08/22/18  Yes Barton Dubois, MD  potassium chloride SA (K-DUR) 20 MEQ tablet Take 20 mEq by mouth 2 (two) times daily.   Yes [provider]  PROAIR HFA 108 (90 BASE) MCG/ACT inhaler Inhale 2 puffs into the lungs every 6 (six) hours as needed for wheezing or shortness of breath.  03/06/11  Yes [provider]  QUEtiapine (SEROQUEL) 25 MG tablet Take 1 tablet (25 mg total) by mouth daily at 6 PM. 11/11/18 12/11/18 Yes Shah, Pratik D, DO  rivaroxaban (XARELTO) 20 MG TABS tablet Take 1 tablet (20 mg total) by mouth daily with supper. Stop Xarelto until Monday (08/26/18) in anticipation for kyphoplasty 08/22/18  Yes Barton Dubois, MD  TRELEGY ELLIPTA 100-62.5-25 MCG/INH AEPB Take 1 puff by mouth daily. 08/27/17  Yes [provider]    Current Facility-Administered Medications  Medication Dose Route Frequency Provider Last Rate Last Dose  . acetaminophen (TYLENOL) tablet 650 mg  650 mg Oral Q6H PRN Jani Gravel, MD   650 mg at 12/06/18 1038   Or  . acetaminophen (TYLENOL) suppository 650 mg  650 mg Rectal Q6H PRN Jani Gravel, MD      . albuterol (PROVENTIL) (2.5 MG/3ML) 0.083% nebulizer solution 2.5 mg  2.5 mg Nebulization Q6H PRN Tat, David, MD      . budesonide (PULMICORT) nebulizer solution 0.5 mg  0.5 mg Nebulization BID Tat, David, MD      . dextrose 5 %-0.9 % sodium chloride infusion   Intravenous Continuous Jani Gravel, MD 50 mL/hr at 12/06/18 0456    . insulin aspart (novoLOG) injection 0-9 Units  0-9 Units Subcutaneous Q4H Jani Gravel, MD      . ipratropium-albuterol (DUONEB) 0.5-2.5 (3) MG/3ML nebulizer solution 3 mL  3 mL Nebulization Q8H Tat, David, MD      . metoprolol tartrate (LOPRESSOR) tablet 25 mg  25 mg Oral BID Tat, David, MD      . polyethylene glycol (MIRALAX / GLYCOLAX) packet 17 g  17 g Oral Daily PRN Jani Gravel, MD      . QUEtiapine (SEROQUEL) tablet 25 mg  25 mg Oral q1800 Jani Gravel, MD         Allergies as of 12/05/2018 - Review Complete 12/05/2018  Allergen Reaction Noted  . Codeine Other (See Comments) 12/20/2014    Family History  Problem Relation Age of Onset  . Cancer Mother        lung  . Diabetes Brother     Social History   Socioeconomic History  . Marital status: Single    Spouse name: Not on file  . Number of children: Not on file  . Years of education: Not on file  . Highest education level: Not on file  Occupational History  . Not on file  Social Needs  .  Financial resource strain: Not hard at all  . Food insecurity    Worry: Never true    Inability: Never true  . Transportation needs    Medical: Patient refused    Non-medical: Patient refused  Tobacco Use  . Smoking status: Former Smoker    Packs/day: 0.50    Years: 32.00    Pack years: 16.00    Types: Cigarettes    Start date: 12/12/1962    Quit date: 03/13/1994    Years since quitting: 24.7  . Smokeless tobacco: Never Used  Substance and Sexual Activity  . Alcohol use: No    Alcohol/week: 0.0 standard drinks  . Drug use: No  . Sexual activity: Not on file  Lifestyle  . Physical activity    Days per week: Patient refused    Minutes per session: Patient refused  . Stress: Not at all  Relationships  . Social Herbalist on phone: Three times a week    Gets together: Three times a week    Attends religious service: More than 4 times per year    Active member of club or organization: Yes    Attends meetings of clubs or organizations: More than 4 times per year    Relationship status: Never married  . Intimate partner violence    Fear of current or ex partner: No    Emotionally abused: No    Physically abused: No    Forced sexual activity: No  Other Topics Concern  . Not on file  Social History Narrative  . Not on file    Review of Systems: See HPI, otherwise normal ROS  Physical Exam: Pulse Rate:  [69-87] 73 (09/25 1020) Resp:  [15-24] 18 (09/25 1020) BP:  (96-112)/(44-92) 100/63 (09/25 1020) SpO2:  [91 %-100 %] 96 % (09/25 1020) Weight:  [63.5 kg-69.7 kg] 69.7 kg (09/25 1000) Last BM Date: 12/05/18 Patient is alert and in no acute distress. She responds appropriately to questions. Conjunctivae is pale.  Sclerae nonicteric. Oropharyngeal mucosa is normal.  She is edentulous.  She states she uses dentures. Neck without thyromegaly lymphadenopathy or masses. Cardiac exam reveals regular rhythm normal S1 and S2.  No murmur gallop noted. Auscultation lungs reveal diminished intensity of breath sounds bilaterally.  No rales or rhonchi noted. Abdomen is symmetrical with prominence in upper mid abdomen secondary to hernia of linea alba.  It is soft mildly tender.  Bowel sounds are normal.  On palpation rest of the abdomen is soft without organomegaly or masses. Rectal examination deferred. No peripheral edema or clubbing noted.   Lab Results: Recent Labs    12/05/18 1515 12/06/18 0700  WBC 9.6 7.2  HGB 10.7* 9.0*  HCT 38.4 33.0*  PLT 194 159   BMET Recent Labs    12/05/18 1515 12/06/18 0700  NA 140 141  K 5.1 4.2  CL 89* 95*  CO2 37* 38*  GLUCOSE 119* 168*  BUN 10 11  CREATININE 0.69 0.55  CALCIUM 9.9 8.9   LFT Recent Labs    12/06/18 0700  PROT 5.4*  ALBUMIN 2.8*  AST 21  ALT 21  ALKPHOS 83  BILITOT 0.5   PT/INR Recent Labs    12/05/18 1515  LABPROT 17.6*  INR 1.5*   Hepatitis Panel No results for input(s): HEPBSAG, HCVAB, HEPAIGM, HEPBIGM in the last 72 hours.  Studies/Results: Dg Chest 2 View  Result Date: 12/05/2018 CLINICAL DATA:  Bright red blood in the stool and rectal pain beginning  today. History of hemorrhoids. EXAM: CHEST - 2 VIEW COMPARISON:  11/19/2018 FINDINGS: Cardiac silhouette is mildly enlarged. No mediastinal or hilar masses. No evidence of adenopathy. Chronic prominence of the bronchovascular markings. Additional linear lung base opacities consistent with scarring or atelectasis. No evidence  of pneumonia or pulmonary edema. No pleural effusion or pneumothorax. Previously treated compression fracture at the thoracolumbar junction. No acute skeletal abnormality. IMPRESSION: No acute cardiopulmonary disease. Electronically Signed   By: Lajean Manes M.D.   On: 12/05/2018 18:02   Ct Abdomen Pelvis W Contrast  Result Date: 12/05/2018 CLINICAL DATA:  73 year old female with a history of GI bleed EXAM: CT ABDOMEN AND PELVIS WITH CONTRAST TECHNIQUE: Multidetector CT imaging of the abdomen and pelvis was performed using the standard protocol following bolus administration of intravenous contrast. CONTRAST:  113mL OMNIPAQUE IOHEXOL 300 MG/ML  SOLN COMPARISON:  04/21/2018 FINDINGS: Lower chest: Scarring/atelectasis at the lung bases. Hepatobiliary: Unremarkable liver.  Unremarkable gallbladder. Pancreas: Unremarkable Spleen: Unremarkable Adrenals/Urinary Tract: Unremarkable appearance of the bilateral adrenal glands. Right kidney with no hydronephrosis or nephrolithiasis. Bosniak 1 cyst on the medial cortex of the right kidney on image 22. Additional subcentimeter low-density lesion on the anterior cortex of the right kidney on image 27 too small to characterize. Left kidney without hydronephrosis. Nonobstructive nephrolithiasis at the inferior collecting system, with stone measuring approximately 4 mm. Bosniak 1 cyst on the posterior cortex of the left kidney on image 26. Bosniak 1 cyst on the medial anterior cortex of the left kidney on image 28. There are additional low-density lesions which are too small to characterize. Urinary bladder is relatively decompressed. Stomach/Bowel: Small hiatal hernia. Otherwise unremarkable stomach. Small bowel unremarkable. No abnormal distention. No focal wall thickening or inflammatory changes. No transition point. Normal appendix. Mild stool burden. Colonic diverticular disease throughout the colon. Retained contrast or inspissated secretions within numerous diverticula.  No focal inflammatory changes are evident. No fluid within the bowel or contrast accumulation. Vascular/Lymphatic: Atherosclerotic changes of the abdominal aorta. No aneurysm or dissection. Mesenteric arteries and renal arteries are patent. Bilateral iliac arteries and proximal femoral arteries are patent. No adenopathy. Reproductive: Focal low-density fluid or tissue within the endometrial canal on image 58. Unremarkable left and right ovary. Other: Fat containing umbilical hernia with ventral diastasis. Small loop of small bowel at this site without entrapment. Musculoskeletal: Fracture of L1, treated with vertebral augmentation. Degree of posterior retropulsion similar to the comparison. Are no acute displaced fracture. IMPRESSION: No acute CT finding of the abdomen, with no CT evidence to identify source of GI hemorrhage. Colonic diverticula without evidence of acute inflammation. Hiatal hernia. Trace fluid or tissue within the endometrial canal, unexpected in a patient of this age. Referral for gynecologic follow-up is recommended, and potentially evaluation with ultrasound or MRI as early malignancy cannot be excluded. Aortic Atherosclerosis (ICD10-I70.0). Left-sided nonobstructive nephrolithiasis. Electronically Signed   By: Corrie Mckusick D.O.   On: 12/05/2018 18:05    Assessment;  Patient is 73 year old African-American female who presents with painless hematochezia.  Patient is on an anticoagulant for atrial flutter.  Patient underwent colonoscopy in January 22, 2018 when she presented with rectal bleeding.  She was found to have sigmoid diverticulosis. Suspect diverticular bleed.  She has not had a bowel movement in over 6 hours.  She may not need further intervention unless bleeding continues there is need for PRBC transfusion. I agree with holding anticoagulant.  Anemia.  She possibly has an element of chronic disease anemia.  Nevertheless her hemoglobin  has dropped significantly since first  check yesterday.  Recommendations;  Begin clear liquids. Serial H&H. If evidence of continued bleeding will consider colonoscopy with therapeutic intention.  I contacted patient's daughter [name] and brought her up-to-date on her condition. Dr. Gala Romney be seen patient over the weekend.    LOS: 0 days   Najeeb Rehman  12/06/2018, 12:28 PM

## 2018-12-06 NOTE — ED Notes (Signed)
Pt experiencing periods of confusion but easily reoriented. Pt removed IV, EKG leads and found naked lying in bed upon rounding. Pt cleaned, new IV access established and Pt back on monitor.

## 2018-12-06 NOTE — Progress Notes (Addendum)
PROGRESS NOTE  Eileen [name] [id number] DOB: [birth year] DOA: 12/05/2018 PCP: Lavella Lemons, PA  Brief History:  73 year old female with a history of COPD, chronic respiratory failure on 3.5 L, right-sided breast cancer in remission, atrial flutter on rivaroxaban, diabetes mellitus type 2, hypertension, dementia with behavior disturbance, chronic L1 fracture status post kyphoplasty presenting with hematochezia for approximately 1 week.  There have been different accounts regarding the duration of the patient's hematochezia, but the patient confirms that it has been approximately 1 week with intermittent hematochezia.  She denied any fevers, chills, abdominal pain.  Patient denies any NSAIDs or new over-the-counter medications.  She denies any headache, neck pain, chest pain, nausea, vomiting, diarrhea.  She has chronic shortness of breath which she states is actually low but better than usual.  She has a nonproductive cough.  She denies any hemoptysis. In the emergency department, the patient was afebrile hemodynamically stable saturating 99% on 3.5 L.  WBC was 9.6 with hemoglobin 10.7.  BMP was essentially unremarkable.  LFTs and lipase were unremarkable.  CT of the abdomen and pelvis showed diverticulosis with inspissated secretions and numerous diverticula.  There was no bowel wall thickening or obstruction.  GI was consulted to assist with management.  Assessment/Plan: Hematochezia -Suspect diverticular bleed -GI consult -03/24/2017 colonoscopy--sigmoid diverticulosis, internal and external hemorrhoids -Baseline hemoglobin~10 -Presented with hemoglobin 10.7 -remain npo for now -Judicious IV fluids -Holding rivaroxaban  Atrial flutter -Holding rivaroxaban -Rate controlled -Presently in sinus rhythm -Restart metoprolol at lower dose secondary to soft blood pressure  Chronic respiratory failure with hypoxia -Stable on 3.5 L -Start bronchodilators  COPD -Stable  presently -Restart bronchodilators  Diabetes mellitus type 2, uncontrolled with hyperglycemia -09/29/2018 hemoglobin A1c 8.5 -NovoLog sliding scale -Holding metformin  Dementia with behavioral disturbance -Restart Seroquel at bedtime  Essential hypertension -Restart metoprolol titrate a lower dose secondary to soft blood pressure       Disposition Plan:   Home in 2-3 days  Family Communication:  No Family at bedside  Consultants:  GI-Rehman  Code Status:  FULL   DVT Prophylaxis:  SCDs   Procedures: As Listed in Progress Note Above  Antibiotics: None       Subjective: Patient denies fevers, chills, headache, chest pain, dyspnea, nausea, vomiting, diarrhea, abdominal pain, dysuria, hematuria, hematochezia, and melena.   Objective: Vitals:   12/06/18 0334 12/06/18 0400 12/06/18 0430 12/06/18 0530  BP: (!) 104/55 112/68 99/60 (!) 105/44  Pulse: 75 76 74 75  Resp: 15 20 19    SpO2: 94% 99% 100% 99%  Weight:      Height:        Intake/Output Summary (Last 24 hours) at 12/06/2018 [id number] Last data filed at 12/06/2018 0455 Gross per 24 hour  Intake 1200 ml  Output --  Net 1200 ml   Weight change:  Exam:   General:  Pt is alert, follows commands appropriately, not in acute distress  HEENT: No icterus, No thrush, No neck mass, [place]/AT  Cardiovascular: RRR, S1/S2, no rubs, no gallops  Respiratory: diminished breath sounds.  Bibasilar rales  Abdomen: Soft/+BS, non tender, non distended, no guarding  Extremities: No edema, No lymphangitis, No petechiae, No rashes, no synovitis   Data Reviewed: I have personally reviewed following labs and imaging studies Basic Metabolic Panel: Recent Labs  Lab 12/05/18 1515  NA 140  K 5.1  CL 89*  CO2 37*  GLUCOSE 119*  BUN 10  CREATININE 0.69  CALCIUM 9.9   Liver Function Tests: Recent Labs  Lab 12/05/18 1515  AST 26  ALT 27  ALKPHOS 110  BILITOT 0.7  PROT 6.7  ALBUMIN 3.5   Recent Labs  Lab  12/05/18 1515  LIPASE 30   No results for input(s): AMMONIA in the last 168 hours. Coagulation Profile: Recent Labs  Lab 12/05/18 1515  INR 1.5*   CBC: Recent Labs  Lab 12/05/18 1515 12/06/18 0700  WBC 9.6 7.2  NEUTROABS 6.2  --   HGB 10.7* 9.0*  HCT 38.4 33.0*  MCV 98.0 98.8  PLT 194 159   Cardiac Enzymes: No results for input(s): CKTOTAL, CKMB, CKMBINDEX, TROPONINI in the last 168 hours. BNP: Invalid input(s): POCBNP CBG: Recent Labs  Lab 12/05/18 1949 12/05/18 2358 12/06/18 0004 12/06/18 0434 12/06/18 0547  GLUCAP 87 90 78 67* 186*   HbA1C: No results for input(s): HGBA1C in the last 72 hours. Urine analysis:    Component Value Date/Time   COLORURINE YELLOW 12/05/2018 1912   APPEARANCEUR CLEAR 12/05/2018 1912   LABSPEC >1.046 (H) 12/05/2018 1912   PHURINE 6.0 12/05/2018 1912   GLUCOSEU NEGATIVE 12/05/2018 1912   HGBUR LARGE (A) 12/05/2018 1912   BILIRUBINUR NEGATIVE 12/05/2018 [place] NEGATIVE 12/05/2018 1912   PROTEINUR NEGATIVE 12/05/2018 1912   UROBILINOGEN 0.2 09/09/2009 1010   NITRITE NEGATIVE 12/05/2018 1912   LEUKOCYTESUR NEGATIVE 12/05/2018 1912   Sepsis Labs: @LABRCNTIP (procalcitonin:4,lacticidven:4) ) Recent Results (from the past 240 hour(s))  SARS Coronavirus 2 [facility] order, Performed in [facility] hospital lab) Nasopharyngeal Nasopharyngeal Swab     Status: None   Collection Time: 12/05/18  7:14 PM   Specimen: Nasopharyngeal Swab  Result Value Ref Range Status   SARS Coronavirus 2 NEGATIVE NEGATIVE Final    Comment: (NOTE) If result is NEGATIVE SARS-CoV-2 target nucleic acids are NOT DETECTED. The SARS-CoV-2 RNA is generally detectable in upper and lower  respiratory specimens during the acute phase of infection. The lowest  concentration of SARS-CoV-2 viral copies this assay can detect is 250  copies / mL. A negative result does not preclude SARS-CoV-2 infection  and should not be used as the sole basis for treatment  or other  patient management decisions.  A negative result may occur with  improper specimen collection / handling, submission of specimen other  than nasopharyngeal swab, presence of viral mutation(s) within the  areas targeted by this assay, and inadequate number of viral copies  (<250 copies / mL). A negative result must be combined with clinical  observations, patient history, and epidemiological information. If result is POSITIVE SARS-CoV-2 target nucleic acids are DETECTED. The SARS-CoV-2 RNA is generally detectable in upper and lower  respiratory specimens dur ing the acute phase of infection.  Positive  results are indicative of active infection with SARS-CoV-2.  Clinical  correlation with patient history and other diagnostic information is  necessary to determine patient infection status.  Positive results do  not rule out bacterial infection or co-infection with other viruses. If result is PRESUMPTIVE POSTIVE SARS-CoV-2 nucleic acids MAY BE PRESENT.   A presumptive positive result was obtained on the submitted specimen  and confirmed on repeat testing.  While 2019 novel coronavirus  (SARS-CoV-2) nucleic acids may be present in the submitted sample  additional confirmatory testing may be necessary for epidemiological  and / or clinical management purposes  to differentiate between  SARS-CoV-2 and other Sarbecovirus currently known to infect humans.  If clinically indicated additional testing with an alternate  test  methodology [phone]) is advised. The SARS-CoV-2 RNA is generally  detectable in upper and lower respiratory sp ecimens during the acute  phase of infection. The expected result is Negative. Fact Sheet for Patients:  StrictlyIdeas.no Fact Sheet for Healthcare Providers: BankingDealers.co.za This test is not yet approved or cleared by the [place] FDA and has been authorized for detection and/or diagnosis of  SARS-CoV-2 by FDA under an Emergency Use Authorization (EUA).  This EUA will remain in effect (meaning this test can be used) for the duration of the COVID-19 declaration under Section 564(b)(1) of the Act, 21 U.S.C. section 360bbb-3(b)(1), unless the authorization is terminated or revoked sooner. Performed at [facility], [address], [place], [place] [address]      Scheduled Meds:  insulin aspart  0-9 Units Subcutaneous Q4H   Continuous Infusions:  dextrose 5 % and 0.9% NaCl 50 mL/hr at 12/06/18 0456    Procedures/Studies: Dg Chest 2 View  Result Date: 12/05/2018 CLINICAL DATA:  Bright red blood in the stool and rectal pain beginning today. History of hemorrhoids. EXAM: CHEST - 2 VIEW COMPARISON:  11/19/2018 FINDINGS: Cardiac silhouette is mildly enlarged. No mediastinal or hilar masses. No evidence of adenopathy. Chronic prominence of the bronchovascular markings. Additional linear lung base opacities consistent with scarring or atelectasis. No evidence of pneumonia or pulmonary edema. No pleural effusion or pneumothorax. Previously treated compression fracture at the thoracolumbar junction. No acute skeletal abnormality. IMPRESSION: No acute cardiopulmonary disease. Electronically Signed   By: Lajean Manes M.D.   On: 12/05/2018 18:02   Ct Head Wo Contrast  Result Date: 11/06/2018 CLINICAL DATA:  Altered level of consciousness EXAM: CT HEAD WITHOUT CONTRAST TECHNIQUE: Contiguous axial images were obtained from the base of the skull through the vertex without intravenous contrast. COMPARISON:  10/26/2018 FINDINGS: Brain: No acute intracranial abnormality. Specifically, no hemorrhage, hydrocephalus, mass lesion, acute infarction, or significant intracranial injury. Vascular: No hyperdense vessel or unexpected calcification. Skull: No acute calvarial abnormality. Sinuses/Orbits: Visualized paranasal sinuses and mastoids clear. Orbital soft tissues unremarkable. Other: None IMPRESSION:  No acute intracranial abnormality. Electronically Signed   By: Rolm Baptise M.D.   On: 11/06/2018 18:45   Mr Brain Wo Contrast  Result Date: 11/08/2018 CLINICAL DATA:  73 year old female with unexplained altered mental status for 2 days. Encephalopathy. EXAM: MRI HEAD WITHOUT CONTRAST TECHNIQUE: Multiplanar, multiecho pulse sequences of the brain and surrounding structures were obtained without intravenous contrast. COMPARISON:  Head CTs 11/06/2018 and earlier. FINDINGS: Brain: No restricted diffusion to suggest acute infarction. No midline shift, mass effect, evidence of mass lesion, ventriculomegaly, extra-axial collection or acute intracranial hemorrhage. Cervicomedullary junction and pituitary are within normal limits. Axial T2 images are suboptimal due to motion, and the patient discontinued the exam before coronal T2 images could be obtained. However, gray and white matter signal is largely normal for age throughout the brain. There is mild nonspecific patchy mostly periventricular white matter T2 and FLAIR hyperintensity. No cortical encephalomalacia or chronic blood products identified. The deep gray nuclei, brainstem and cerebellum appear negative. Vascular: Major intracranial vascular flow voids are preserved. Skull and upper cervical spine: Negative visible cervical spine. Visualized bone marrow signal is within normal limits. Sinuses/Orbits: Motion artifact at the orbits which appear grossly negative. Paranasal sinuses are stable and well pneumatized. Other: There is a mild right mastoid effusion redemonstrated. Negative nasopharynx. Left mastoids are clear. Scalp and face soft tissues appear negative. IMPRESSION: 1. No acute intracranial abnormality, and largely negative for age non-contrast MRI  appearance of the brain allowing for some motion degradation. 2. Mild right mastoid effusion, likely postinflammatory. Electronically Signed   By: Genevie Ann M.D.   On: 11/08/2018 15:32   Ct Abdomen Pelvis W  Contrast  Result Date: 12/05/2018 CLINICAL DATA:  73 year old female with a history of GI bleed EXAM: CT ABDOMEN AND PELVIS WITH CONTRAST TECHNIQUE: Multidetector CT imaging of the abdomen and pelvis was performed using the standard protocol following bolus administration of intravenous contrast. CONTRAST:  162mL OMNIPAQUE IOHEXOL 300 MG/ML  SOLN COMPARISON:  04/21/2018 FINDINGS: Lower chest: Scarring/atelectasis at the lung bases. Hepatobiliary: Unremarkable liver.  Unremarkable gallbladder. Pancreas: Unremarkable Spleen: Unremarkable Adrenals/Urinary Tract: Unremarkable appearance of the bilateral adrenal glands. Right kidney with no hydronephrosis or nephrolithiasis. Bosniak 1 cyst on the medial cortex of the right kidney on image 22. Additional subcentimeter low-density lesion on the anterior cortex of the right kidney on image 27 too small to characterize. Left kidney without hydronephrosis. Nonobstructive nephrolithiasis at the inferior collecting system, with stone measuring approximately 4 mm. Bosniak 1 cyst on the posterior cortex of the left kidney on image 26. Bosniak 1 cyst on the medial anterior cortex of the left kidney on image 28. There are additional low-density lesions which are too small to characterize. Urinary bladder is relatively decompressed. Stomach/Bowel: Small hiatal hernia. Otherwise unremarkable stomach. Small bowel unremarkable. No abnormal distention. No focal wall thickening or inflammatory changes. No transition point. Normal appendix. Mild stool burden. Colonic diverticular disease throughout the colon. Retained contrast or inspissated secretions within numerous diverticula. No focal inflammatory changes are evident. No fluid within the bowel or contrast accumulation. Vascular/Lymphatic: Atherosclerotic changes of the abdominal aorta. No aneurysm or dissection. Mesenteric arteries and renal arteries are patent. Bilateral iliac arteries and proximal femoral arteries are patent. No  adenopathy. Reproductive: Focal low-density fluid or tissue within the endometrial canal on image 58. Unremarkable left and right ovary. Other: Fat containing umbilical hernia with ventral diastasis. Small loop of small bowel at this site without entrapment. Musculoskeletal: Fracture of L1, treated with vertebral augmentation. Degree of posterior retropulsion similar to the comparison. Are no acute displaced fracture. IMPRESSION: No acute CT finding of the abdomen, with no CT evidence to identify source of GI hemorrhage. Colonic diverticula without evidence of acute inflammation. Hiatal hernia. Trace fluid or tissue within the endometrial canal, unexpected in a patient of this age. Referral for gynecologic follow-up is recommended, and potentially evaluation with ultrasound or MRI as early malignancy cannot be excluded. Aortic Atherosclerosis (ICD10-I70.0). Left-sided nonobstructive nephrolithiasis. Electronically Signed   By: Corrie Mckusick D.O.   On: 12/05/2018 18:05   Dg Chest Port 1 View  Result Date: 11/19/2018 CLINICAL DATA:  Acute respiratory failure with hypoxia. EXAM: PORTABLE CHEST 1 VIEW COMPARISON:  Radiograph of November 17, 2018. FINDINGS: Stable cardiomegaly. No pneumothorax is noted. Increased right basilar atelectasis or infiltrate is noted. Minimal left basilar subsegmental atelectasis is noted. Small pleural effusions may be present. Bony thorax is unremarkable. IMPRESSION: Increased right basilar opacity as described above. Electronically Signed   By: Marijo Conception M.D.   On: 11/19/2018 07:29   Dg Chest Port 1 View  Result Date: 11/17/2018 CLINICAL DATA:  Acute respiratory failure.  History of COPD. EXAM: PORTABLE CHEST 1 VIEW COMPARISON:  Single-view of the chest 11/15/2018 and 11/06/2018. CT chest 09/27/2018. FINDINGS: The lungs are emphysematous. Right basilar opacity seen on the most recent examination has resolved. Minimal atelectasis left lung base noted. Heart size is enlarged. No  pneumothorax or  pleural fluid. IMPRESSION: Resolved right basilar airspace opacity. No change in left basilar subsegmental atelectasis. Emphysema. Atherosclerosis. Electronically Signed   By: Inge Rise M.D.   On: 11/17/2018 09:06   Dg Chest Portable 1 View  Result Date: 11/16/2018 CLINICAL DATA:  Respiratory distress EXAM: PORTABLE CHEST 1 VIEW COMPARISON:  November 06, 2018 FINDINGS: There is mild cardiomegaly. Streaky airspace opacity seen at the right lung base. The left lung is clear. No acute osseous abnormality. IMPRESSION: Streaky airspace opacity at the right lung base which could be due to atelectasis and/or early infectious etiology. Electronically Signed   By: Prudencio Pair M.D.   On: 11/16/2018 00:07   Dg Chest Port 1 View  Result Date: 11/06/2018 CLINICAL DATA:  Shortness of breath EXAM: PORTABLE CHEST 1 VIEW COMPARISON:  October 23, 2018 FINDINGS: There is persistent airspace opacity in the left base. There is atelectatic change in the right base. Heart remains enlarged with mild pulmonary venous hypertension. No adenopathy. No bone lesions. IMPRESSION: Persistent airspace consolidation consistent with pneumonia left base. Atelectasis right base. Underlying cardiomegaly with a degree of pulmonary vascular congestion. Electronically Signed   By: Lowella Grip III M.D.   On: 11/06/2018 17:08   Dg Swallowing Func-speech Pathology  Result Date: 11/21/2018 Objective Swallowing Evaluation: Type of Study: MBS-Modified Barium Swallow Study  Patient Details Name: Eileen A Peckham MRN: [id number] Date of Birth: 12/03/1945 Today's Date: 11/21/2018 Time: SLP Start Time (ACUTE ONLY): 1210 -SLP Stop Time (ACUTE ONLY): [id number] SLP Time Calculation (min) (ACUTE ONLY): 35 min Past Medical History: Past Medical History: Diagnosis Date  Asthma   Atrial flutter ([place])   Cancer ([place])   Right breast  COPD (chronic obstructive pulmonary disease) ([place])   Dementia ([place])   History of breast cancer   right  breast  Hypertension   On home O2   Type 2 diabetes mellitus ([place])  Past Surgical History: Past Surgical History: Procedure Laterality Date  BREAST SURGERY  2011  Right breast mastectomy  CARDIOVERSION N/A 06/27/2018  Procedure: CARDIOVERSION;  Surgeon: Arnoldo Lenis, MD;  [facility];  Service: Endoscopy;  Laterality: N/A;  CESAREAN SECTION    COLONOSCOPY N/A 01/22/2018  Procedure: COLONOSCOPY;  Surgeon: Rogene Houston, MD;  [facility];  Service: Endoscopy;  Laterality: N/A;  HERNIA REPAIR    RIH  IR VERTEBROPLASTY LUMBAR BX INC UNI/BIL INC/INJECT/IMAGING  09/05/2018  MASTECTOMY  2011  right breast  TEE WITHOUT CARDIOVERSION N/A 06/27/2018  Procedure: TRANSESOPHAGEAL ECHOCARDIOGRAM (TEE) WITH PROPOFOL;  Surgeon: Arnoldo Lenis, MD;  [facility];  Service: Endoscopy;  Laterality: N/A; HPI: Eileen A Blahut is a 73 y.o. female with a multiple medical problems as documented below but most importantly COPD and CHF who presents the emergency department today with respiratory distress.  Patient, on review of records, is here often for respiratory failure with hypoxia and hypercarbia.  Symptoms altered with that as well.  Onset tonight was similar.  EMS got to her and her sats were low they gave her albuterol brought here for further evaluation.  No further history able to be obtained secondary to patient's mental status. BSE requested.  Subjective: Pt alert and cooperative, "doing better" Assessment / Plan / Recommendation CHL IP CLINICAL IMPRESSIONS 11/21/2018 Clinical Impression Pt presents with min oropharyngeal dysphagia characterized by min premature spillage of liquids to the valleculae and spilling to the pyriforms with occasional flash penetration without aspiration except for one episode of overt aspiration before the swallow  when taking straw sips of thin liquids to due premature spillage and suspected breath inhalation. Aspiration was silent and was not  removed despite SLP prompts for coughing. Aspiration of thins with a straw was mitigated through use of chin tuck and small sips. Recommend regular textures and thin liquids, NO STRAWS, swallow 2x for each swallow and clear throat periodically. Pt appreciative for recommendations and verbalized that she will take small cup sips of liquids. SLP will follow for diet tolerance x1, however no SLP f/u post acute necessary at this time.  SLP Visit Diagnosis Dysphagia, oropharyngeal phase (R13.12) Attention and concentration deficit following -- Frontal lobe and executive function deficit following -- Impact on safety and function Mild aspiration risk   CHL IP TREATMENT RECOMMENDATION 11/21/2018 Treatment Recommendations Therapy as outlined in treatment plan below   Prognosis 11/21/2018 Prognosis for Safe Diet Advancement Good Barriers to Reach Goals -- Barriers/Prognosis Comment -- CHL IP DIET RECOMMENDATION 11/21/2018 SLP Diet Recommendations Dysphagia 3 (Mech soft) solids;Thin liquid Liquid Administration via Cup;No straw Medication Administration Whole meds with puree Compensations Slow rate;Small sips/bites Postural Changes Remain semi-upright after after feeds/meals (Comment);Seated upright at 90 degrees   CHL IP OTHER RECOMMENDATIONS 11/21/2018 Recommended Consults -- Oral Care Recommendations Oral care BID;Staff/trained caregiver to provide oral care Other Recommendations Clarify dietary restrictions   CHL IP FOLLOW UP RECOMMENDATIONS 11/21/2018 Follow up Recommendations None   CHL IP FREQUENCY AND DURATION 11/21/2018 Speech Therapy Frequency (ACUTE ONLY) min 2x/week Treatment Duration 1 week      CHL IP ORAL PHASE 11/21/2018 Oral Phase Impaired Oral - Pudding Teaspoon -- Oral - Pudding Cup -- Oral - Honey Teaspoon -- Oral - Honey Cup -- Oral - Nectar Teaspoon -- Oral - Nectar Cup -- Oral - Nectar Straw -- Oral - Thin Teaspoon -- Oral - Thin Cup -- Oral - Thin Straw -- Oral - Puree -- Oral - Mech Soft -- Oral - Regular --  Oral - Multi-Consistency -- Oral - Pill -- Oral Phase - Comment --  CHL IP PHARYNGEAL PHASE 11/21/2018 Pharyngeal Phase Impaired Pharyngeal- Pudding Teaspoon -- Pharyngeal -- Pharyngeal- Pudding Cup -- Pharyngeal -- Pharyngeal- Honey Teaspoon -- Pharyngeal -- Pharyngeal- Honey Cup -- Pharyngeal -- Pharyngeal- Nectar Teaspoon -- Pharyngeal -- Pharyngeal- Nectar Cup -- Pharyngeal -- Pharyngeal- Nectar Straw -- Pharyngeal -- Pharyngeal- Thin Teaspoon Delayed swallow initiation-vallecula;Delayed swallow initiation-pyriform sinuses Pharyngeal -- Pharyngeal- Thin Cup Delayed swallow initiation-vallecula;Delayed swallow initiation-pyriform sinuses;Penetration/Aspiration during swallow Pharyngeal Material enters airway, CONTACTS cords and then ejected out;Material does not enter airway Pharyngeal- Thin Straw Delayed swallow initiation-pyriform sinuses;Penetration/Aspiration before swallow;Reduced airway/laryngeal closure;Trace aspiration;Moderate aspiration Pharyngeal Material enters airway, passes BELOW cords without attempt by patient to eject out (silent aspiration) Pharyngeal- Puree Delayed swallow initiation-vallecula Pharyngeal -- Pharyngeal- Mechanical Soft -- Pharyngeal -- Pharyngeal- Regular Delayed swallow initiation-vallecula Pharyngeal -- Pharyngeal- Multi-consistency -- Pharyngeal -- Pharyngeal- Pill Penetration/Aspiration during swallow Pharyngeal Material enters airway, remains ABOVE vocal cords then ejected out Pharyngeal Comment mild/mod aspiration of straw sips thin liquid before the swallow due to premature spillage and reduced vocal fold closure, silent  CHL IP CERVICAL ESOPHAGEAL PHASE 11/21/2018 Cervical Esophageal Phase WFL Pudding Teaspoon -- Pudding Cup -- Honey Teaspoon -- Honey Cup -- Nectar Teaspoon -- Nectar Cup -- Nectar Straw -- Thin Teaspoon -- Thin Cup -- Thin Straw -- Puree -- Mechanical Soft -- Regular -- Multi-consistency -- Pill -- Cervical Esophageal Comment -- Thank you, [name],  [place] PORTER,DABNEY 11/21/2018, 5:59 PM  Orson Eva, DO  [facility] Pager [phone]  If 7PM-7AM, please contact night-coverage [email or web address] Password [email or web address] 12/06/2018, 7:22 AM   LOS: 0 days

## 2018-12-06 NOTE — TOC Initial Note (Signed)
Transition of Care [facility]) - Initial/Assessment Note    Patient Details  Name: Eileen [name] MRN: [id number] Date of Birth: 1945-12-04  Transition of Care [facility]) CM/SW Contact:    Boneta Lucks, RN Phone Number: 12/06/2018, 1:07 PM  Clinical Narrative:       Patient is well known to [facility] due to multiple admissions, with high readmission score. Spoke with [place], no changes from previous assessment. Patient from home with daughter, [name].  Patient ambulates with a walker. Patient is active with Alvis Lemmings  For PT/RN. TOC will follow.             Expected Discharge Plan: [place] Barriers to Discharge: Continued Medical Work up   Patient Goals and CMS Choice Patient states their goals for this hospitalization and ongoing recovery are:: to return home.      Expected Discharge Plan and Services Expected Discharge Plan: [place]    Prior Living Arrangements/Services   Lives with:: Adult Children          Need for Family Participation in Patient Care: Yes (Comment) Care giver support system in place?: Yes (comment) Current home services: DME, Home PT, Home RN Criminal Activity/Legal Involvement Pertinent to Current Situation/Hospitalization: No - Comment as needed  Activities of Daily Living Home Assistive Devices/Equipment: Oxygen, Nebulizer, Walker (specify type) ADL Screening (condition at time of admission) Patient's cognitive ability adequate to safely complete daily activities?: Yes Is the patient deaf or have difficulty hearing?: No Does the patient have difficulty seeing, even when wearing glasses/contacts?: No Does the patient have difficulty concentrating, remembering, or making decisions?: Yes Patient able to express need for assistance with ADLs?: Yes Does the patient have difficulty dressing or bathing?: Yes Independently performs ADLs?: No Communication: Independent Dressing (OT): Needs assistance Is this a change from  baseline?: Pre-admission baseline Grooming: Needs assistance Is this a change from baseline?: Pre-admission baseline Feeding: Independent Bathing: Needs assistance Is this a change from baseline?: Pre-admission baseline Toileting: Needs assistance Is this a change from baseline?: Pre-admission baseline In/Out Bed: Needs assistance Is this a change from baseline?: Pre-admission baseline Walks in Home: Needs assistance Is this a change from baseline?: Pre-admission baseline Does the patient have difficulty walking or climbing stairs?: Yes Weakness of Legs: Both Weakness of Arms/Hands: None    Alcohol / Substance Use: Not Applicable Psych Involvement: No (comment)  Admission diagnosis:  Orthostatic hypotension [I95.1] [place] [K92.1] Patient Active Problem List   Diagnosis Date Noted  . Hematochezia 12/06/2018  . Rectal bleeding 12/05/2018  . Aspiration pneumonia ([place]) 11/19/2018  . Dysphagia 11/19/2018  . Acute respiratory failure with hypoxia and hypercapnia ([place]) 11/16/2018  . Chronic obstructive bronchitis ([place])   . Dementia with behavioral disturbance ([place])   . Goals of care, counseling/discussion   . Advanced care planning/counseling discussion   . Palliative care by specialist   . Bronchiectasis with acute exacerbation ([place])   . COPD exacerbation ([place]) 10/18/2018  . Anemia 10/18/2018  . Osteoporosis 08/30/2018  . Closed compression fracture of body of L1 vertebra ([place])   . Constipation   . Acute and chronic respiratory failure (acute-on-chronic) ([place]) 08/10/2018  . Left lower lobe pneumonia ([place]) 08/10/2018  . Leukocytosis 08/10/2018  . Chronic respiratory failure with hypoxia ([place]) 06/28/2018  . Acute upper GI bleeding 01/20/2018  . Lower GI bleed 01/20/2018  . Acute respiratory failure with hypoxia ([place]) 09/17/2017  . Diabetes mellitus ([place]) 08/31/2017  . Acute hypoxemic respiratory failure ([place]) 08/31/2017  .  Hemorrhoids 10/30/2016  . Atrial fibrillation with  RVR ([place]) 09/24/2016  . Atrial flutter ([place]) 09/23/2016  . Obstructive chronic bronchitis with exacerbation ([place]) 05/23/2016  . Vitamin D deficiency 05/23/2016  . Acute on chronic respiratory failure ([place]) 03/24/2015  . Metabolic encephalopathy [id number]  . HCAP (healthcare-associated pneumonia) 03/24/2015  . Anticoagulation management encounter   . New onset atrial flutter ([place]) 03/21/2015  . Obesity 03/21/2015  . Atrial flutter with rapid ventricular response ([place]) 03/21/2015  . COPD (chronic obstructive pulmonary disease) ([place]) 12/20/2014  . COPD with acute exacerbation ([place]) 12/20/2014  . 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 Breast cancer ([place]) 10/11/2009   PCP:  Lavella Lemons, PA Pharmacy:   [facility], [address] [place] [address] Phone: [phone] Fax: [phone]       Readmission Risk Interventions Readmission Risk Prevention Plan 11/20/2018 11/11/2018 10/25/2018  Transportation Screening - Complete -  PCP or Specialist Appt within 5-7 Days - - -  Home Care Screening - - -  Medication Review (RN CM) - - -  Medication Review (RN Care Manager) - Complete -  PCP or Specialist appointment within 3-5 days of discharge - Complete Complete  HRI or Home Care Consult - Complete Complete  SW Recovery Care/Counseling Consult - Complete -  Palliative Care Screening Complete Complete Not Complete  Comments - - Palliative not available  Skilled Nursing Facility - Not Applicable Not Applicable  Some recent data might be hidden

## 2018-12-06 NOTE — H&P (Signed)
TRH H&P    Patient Demographics:    Eileen [name], is a 73 y.o. female  MRN: [id number]  DOB - 09/22/1945  Admit Date - 12/05/2018  Referring MD/NP/PA: Margette Fast  Outpatient Primary MD for the patient is Lavella Lemons, [place]  Patient coming from:  home  Chief complaint- rectal bleeding   HPI:    Eileen Schwark  is a 73 y.o. female, hx of dementia, hypertension, dm2, atrial flutter, Copd , Diverticulosis, hx of rectal bleeding s/p colonoscopy 01/22/2018-> int and ext hemorrhoids and diverticulosis,  presents with c/o rectal bleeding for the past 1-2 days with blood on toilet paper as well as in toilet. Pt denies fever, chills, n/v, abd pain, diarrhea, black stool, dysuria , hematuria.   In ED  T afebrile P 87 R 24 Bp 111/62  Pox 92%  Wt 63.5kg  CXR IMPRESSION: No acute cardiopulmonary disease.  CT abd/ pelvis IMPRESSION: No acute CT finding of the abdomen, with no CT evidence to identify source of GI hemorrhage. Colonic diverticula without evidence of acute inflammation. Hiatal hernia. Trace fluid or tissue within the endometrial canal, unexpected in a patient of this age. Referral for gynecologic follow-up is recommended, and potentially evaluation with ultrasound or MRI as early malignancy cannot be excluded.  FOBT +++   Na 140, K 5.1, Bun 10, Creatinine 0.69 Ast 26, Alt 27 Lipase 30 Trop 7  Wbc 9.6, Hgb 10.7, Plt 194  INR 1.5  Pt will be admitted for rectal bleeding.      Review of systems:    In addition to the HPI above,  No Fever-chills, No Headache, No changes with Vision or hearing, No problems swallowing food or Liquids, No Chest pain, Cough or Shortness of Breath, No Abdominal pain, No Nausea or Vomiting, bowel movements are regular, No Blood in  Urine, No dysuria, No new skin rashes or bruises, No new joints pains-aches,  No new weakness, tingling,  numbness in any extremity, No recent weight gain or loss, No polyuria, polydypsia or polyphagia, No significant Mental Stressors.  All other systems reviewed and are negative.    Past History of the following :    Past Medical History:  Diagnosis Date   Asthma    Atrial flutter ([place])    Cancer ([place])    Right breast   COPD (chronic obstructive pulmonary disease) ([place])    Dementia ([place])    Diverticulosis    Hemorrhoids    History of breast cancer    right breast   Hypertension    On home O2    Type 2 diabetes mellitus ([place])       Past Surgical History:  Procedure Laterality Date   BREAST SURGERY  2011   Right breast mastectomy   CARDIOVERSION N/A 06/27/2018   Procedure: CARDIOVERSION;  Surgeon: Arnoldo Lenis, MD;  [facility];  Service: Endoscopy;  Laterality: N/A;   CESAREAN SECTION     COLONOSCOPY N/A 01/22/2018   Procedure: COLONOSCOPY;  Surgeon: Rogene Houston, MD;  Location[facility];  Service: Endoscopy;  Laterality: N/A;   HERNIA REPAIR     RIH   IR VERTEBROPLASTY LUMBAR BX INC UNI/BIL INC/INJECT/IMAGING  09/05/2018   MASTECTOMY  2011   right breast   TEE WITHOUT CARDIOVERSION N/A 06/27/2018   Procedure: TRANSESOPHAGEAL ECHOCARDIOGRAM (TEE) WITH PROPOFOL;  Surgeon: Arnoldo Lenis, MD;  [facility];  Service: Endoscopy;  Laterality: N/A;      Social History:      Social History   Tobacco Use   Smoking status: Former Smoker    Packs/day: 0.50    Years: 32.00    Pack years: 16.00    Types: Cigarettes    Start date: 12/12/1962    Quit date: 03/13/1994    Years since quitting: 24.7   Smokeless tobacco: Never Used  Substance Use Topics   Alcohol use: No    Alcohol/week: 0.0 standard drinks       Family History :     Family History  Problem Relation Age of Onset   Cancer Mother        lung   Diabetes Brother        Home Medications:   Prior to Admission medications   Medication  Sig Start Date End Date Taking? Authorizing Provider  acetaminophen (TYLENOL) 500 MG tablet Take 500 mg by mouth every 8 (eight) hours as needed for mild pain or headache.    Yes Lavella Lemons, PA  albuterol (PROVENTIL) (2.5 MG/3ML) 0.083% nebulizer solution Take 2.5 mg by nebulization every 6 (six) hours as needed for wheezing or shortness of breath.   Yes [provider]  furosemide (LASIX) 40 MG tablet Take 1.5 tablets (60 mg total) by mouth daily. Patient taking differently: Take 40 mg by mouth daily as needed for fluid.  11/11/18  Yes Manuella Ghazi, Pratik D, DO  metFORMIN (GLUCOPHAGE-XR) 500 MG 24 hr tablet Take 500 mg by mouth daily with breakfast.  08/10/17  Yes [provider]  metoprolol tartrate (LOPRESSOR) 50 MG tablet Take 50 mg by mouth 2 (two) times daily. 04/12/18  Yes [provider]  polyethylene glycol (MIRALAX / GLYCOLAX) 17 g packet Take 17 g by mouth daily as needed for mild constipation. 08/22/18  Yes Barton Dubois, MD  potassium chloride SA (K-DUR) 20 MEQ tablet Take 20 mEq by mouth 2 (two) times daily.   Yes [provider]  PROAIR HFA 108 (90 BASE) MCG/ACT inhaler Inhale 2 puffs into the lungs every 6 (six) hours as needed for wheezing or shortness of breath.  03/06/11  Yes [provider]  QUEtiapine (SEROQUEL) 25 MG tablet Take 1 tablet (25 mg total) by mouth daily at 6 PM. 11/11/18 12/11/18 Yes Shah, Pratik D, DO  rivaroxaban (XARELTO) 20 MG TABS tablet Take 1 tablet (20 mg total) by mouth daily with supper. Stop Xarelto until Monday (08/26/18) in anticipation for kyphoplasty 08/22/18  Yes Barton Dubois, MD  TRELEGY ELLIPTA 100-62.5-25 MCG/INH AEPB Take 1 puff by mouth daily. 08/27/17  Yes [provider]     Allergies:     Allergies  Allergen Reactions   Codeine Other (See Comments)    "jittery"     Physical Exam:   Vitals  Blood pressure (!) 96/50, pulse 81, resp. rate 19, height 5\' 2"  (1.575 m), weight 63.5 kg, SpO2  100 %.  1.  General: axoxo3  2. Psychiatric: euthymic  3. Neurologic: nonfocal  4. HEENMT:  Anicteric, pupils 1.7mm symmetric, direct, consensual, near intact Neck: no jvd  5. Respiratory : CTAB  6. Cardiovascular : rrr s1, s2,   7. Gastrointestinal:  Abd: soft, nt, nd, +bs  8. Skin:  Ext: no c/c/e,    9.Musculoskeletal:  Good ROM    Data Review:    CBC Recent Labs  Lab 12/05/18 1515  WBC 9.6  HGB 10.7*  HCT 38.4  PLT 194  MCV 98.0  MCH 27.3  MCHC 27.9*  RDW 15.0  LYMPHSABS 2.0  MONOABS 1.0  EOSABS 0.2  BASOSABS 0.1   ------------------------------------------------------------------------------------------------------------------  Results for orders placed or performed during the hospital encounter of 12/05/18 (from the past 48 hour(s))  POC occult blood, ED RN will collect     Status: Abnormal   Collection Time: 12/05/18  2:58 PM  Result Value Ref Range   Fecal Occult Bld POSITIVE (A) NEGATIVE  Comprehensive metabolic panel     Status: Abnormal   Collection Time: 12/05/18  3:15 PM  Result Value Ref Range   Sodium 140 135 - 145 mmol/L   Potassium 5.1 3.5 - 5.1 mmol/L   Chloride 89 (L) 98 - 111 mmol/L   CO2 37 (H) 22 - 32 mmol/L   Glucose, Bld 119 (H) 70 - 99 mg/dL   BUN 10 8 - 23 mg/dL   Creatinine, Ser 0.69 0.44 - 1.00 mg/dL   Calcium 9.9 8.9 - 10.3 mg/dL   Total Protein 6.7 6.5 - 8.1 g/dL   Albumin 3.5 3.5 - 5.0 g/dL   AST 26 15 - 41 U/L   ALT 27 0 - 44 U/L   Alkaline Phosphatase 110 38 - 126 U/L   Total Bilirubin 0.7 0.3 - 1.2 mg/dL   GFR calc non Af Amer >60 >60 mL/min   GFR calc Af Amer >60 >60 mL/min   Anion gap 14 5 - 15    Comment: Performed at [facility], [address], [place], [place] [address]  Lipase, blood     Status: None   Collection Time: 12/05/18  3:15 PM  Result Value Ref Range   Lipase 30 11 - 51 U/L    Comment: Performed at [facility], [address] [place], [place] [address]  Troponin I (High Sensitivity)      Status: None   Collection Time: 12/05/18  3:15 PM  Result Value Ref Range   Troponin I (High Sensitivity) 7 <18 ng/L    Comment: (NOTE) Elevated high sensitivity troponin I (hsTnI) values and significant  changes across serial measurements may suggest ACS but many other  chronic and acute conditions are known to elevate hsTnI results.  Refer to the "Links" section for chest pain algorithms and additional  guidance. Performed at [facility], [address] [place], [place] [address]   CBC with Differential     Status: Abnormal   Collection Time: 12/05/18  3:15 PM  Result Value Ref Range   WBC 9.6 4.0 - 10.5 K/uL   RBC 3.92 3.87 - 5.11 MIL/uL   Hemoglobin 10.7 (L) 12.0 - 15.0 g/dL   HCT 38.4 36.0 - 46.0 %   MCV 98.0 80.0 - 100.0 fL   MCH 27.3 26.0 - 34.0 pg   MCHC 27.9 (L) 30.0 - 36.0 g/dL   RDW 15.0 11.5 - 15.5 %   Platelets 194 150 - 400 K/uL   nRBC 0.0 0.0 - 0.2 %   Neutrophils Relative % 64 %   Neutro Abs 6.2 1.7 - 7.7 K/uL   Lymphocytes Relative 21 %   Lymphs Abs 2.0  0.7 - 4.0 K/uL   Monocytes Relative 11 %   Monocytes Absolute 1.0 0.1 - 1.0 K/uL   Eosinophils Relative 2 %   Eosinophils Absolute 0.2 0.0 - 0.5 K/uL   Basophils Relative 1 %   Basophils Absolute 0.1 0.0 - 0.1 K/uL   Immature Granulocytes 1 %   Abs Immature Granulocytes 0.05 0.00 - 0.07 K/uL    Comment: Performed at [facility], [address] [place], [place] [address]  Protime-INR     Status: Abnormal   Collection Time: 12/05/18  3:15 PM  Result Value Ref Range   Prothrombin Time 17.6 (H) 11.4 - 15.2 seconds   INR 1.5 (H) 0.8 - 1.2    Comment: (NOTE) INR goal varies based on device and disease states. Performed at [facility], [address], [place], [place] [address]   Type and screen [facility]     Status: None   Collection Time: 12/05/18  3:15 PM  Result Value Ref Range   ABO/RH(D) B POS    Antibody Screen NEG    Sample Expiration      12/08/2018,2359 Performed at [facility], [address], [place], [place] [address]   Lactic acid, plasma     Status: None   Collection Time: 12/05/18  3:20 PM  Result Value Ref Range   Lactic Acid, Venous 1.1 0.5 - 1.9 mmol/L    Comment: Performed at [facility], [address], [place], [place] [address]  Lactic acid, plasma     Status: None   Collection Time: 12/05/18  6:03 PM  Result Value Ref Range   Lactic Acid, Venous 1.0 0.5 - 1.9 mmol/L    Comment: Performed at [facility], [address] [place], [place] [address]  Troponin I (High Sensitivity)     Status: None   Collection Time: 12/05/18  6:03 PM  Result Value Ref Range   Troponin I (High Sensitivity) 8 <18 ng/L    Comment: (NOTE) Elevated high sensitivity troponin I (hsTnI) values and significant  changes across serial measurements may suggest ACS but many other  chronic and acute conditions are known to elevate hsTnI results.  Refer to the "Links" section for chest pain algorithms and additional  guidance. Performed at [facility], [address], [place], [place] [address]   Urinalysis, Routine w reflex microscopic     Status: Abnormal   Collection Time: 12/05/18  7:12 PM  Result Value Ref Range   Color, Urine YELLOW YELLOW   APPearance CLEAR CLEAR   Specific Gravity, Urine >1.046 (H) 1.005 - 1.030   pH 6.0 5.0 - 8.0   Glucose, UA NEGATIVE NEGATIVE mg/dL   Hgb urine dipstick LARGE (A) NEGATIVE   Bilirubin Urine NEGATIVE NEGATIVE   Ketones, ur NEGATIVE NEGATIVE mg/dL   Protein, ur NEGATIVE NEGATIVE mg/dL   Nitrite NEGATIVE NEGATIVE   Leukocytes,Ua NEGATIVE NEGATIVE   RBC / HPF 21-50 0 - 5 RBC/hpf   WBC, UA 0-5 0 - 5 WBC/hpf   Bacteria, UA RARE (A) NONE SEEN   Squamous Epithelial / LPF 0-5 0 - 5    Comment: Performed at [facility], [address] [place],  [address]  SARS Coronavirus 2 [facility] order, Performed in [facility] hospital lab) Nasopharyngeal Nasopharyngeal Swab     Status: None   Collection Time: 12/05/18  7:14 PM    Specimen: Nasopharyngeal Swab  Result Value Ref Range   SARS Coronavirus 2 NEGATIVE NEGATIVE    Comment: (NOTE) If  result is NEGATIVE SARS-CoV-2 target nucleic acids are NOT DETECTED. The SARS-CoV-2 RNA is generally detectable in upper and lower  respiratory specimens during the acute phase of infection. The lowest  concentration of SARS-CoV-2 viral copies this assay can detect is 250  copies / mL. A negative result does not preclude SARS-CoV-2 infection  and should not be used as the sole basis for treatment or other  patient management decisions.  A negative result may occur with  improper specimen collection / handling, submission of specimen other  than nasopharyngeal swab, presence of viral mutation(s) within the  areas targeted by this assay, and inadequate number of viral copies  (<250 copies / mL). A negative result must be combined with clinical  observations, patient history, and epidemiological information. If result is POSITIVE SARS-CoV-2 target nucleic acids are DETECTED. The SARS-CoV-2 RNA is generally detectable in upper and lower  respiratory specimens dur ing the acute phase of infection.  Positive  results are indicative of active infection with SARS-CoV-2.  Clinical  correlation with patient history and other diagnostic information is  necessary to determine patient infection status.  Positive results do  not rule out bacterial infection or co-infection with other viruses. If result is PRESUMPTIVE POSTIVE SARS-CoV-2 nucleic acids MAY BE PRESENT.   A presumptive positive result was obtained on the submitted specimen  and confirmed on repeat testing.  While 2019 novel coronavirus  (SARS-CoV-2) nucleic acids may be present in the submitted sample  additional confirmatory testing may be necessary for epidemiological  and / or clinical management purposes  to differentiate between  SARS-CoV-2 and other Sarbecovirus currently known to infect humans.  If clinically  indicated additional testing with an alternate test  methodology [phone]) is advised. The SARS-CoV-2 RNA is generally  detectable in upper and lower respiratory sp ecimens during the acute  phase of infection. The expected result is Negative. Fact Sheet for Patients:  StrictlyIdeas.no Fact Sheet for Healthcare Providers: BankingDealers.co.za This test is not yet approved or cleared by the [place] FDA and has been authorized for detection and/or diagnosis of SARS-CoV-2 by FDA under an Emergency Use Authorization (EUA).  This EUA will remain in effect (meaning this test can be used) for the duration of the COVID-19 declaration under Section 564(b)(1) of the Act, 21 U.S.C. section 360bbb-3(b)(1), unless the authorization is terminated or revoked sooner. Performed at [facility], [address], [place], [place] [address]   CBG monitoring, ED     Status: None   Collection Time: 12/05/18  7:49 PM  Result Value Ref Range   Glucose-Capillary 87 70 - 99 mg/dL  CBG monitoring, ED     Status: None   Collection Time: 12/05/18 11:58 PM  Result Value Ref Range   Glucose-Capillary 90 70 - 99 mg/dL  CBG monitoring, ED     Status: None   Collection Time: 12/06/18 12:04 AM  Result Value Ref Range   Glucose-Capillary 78 70 - 99 mg/dL    Chemistries  Recent Labs  Lab 12/05/18 1515  NA 140  K 5.1  CL 89*  CO2 37*  GLUCOSE 119*  BUN 10  CREATININE 0.69  CALCIUM 9.9  AST 26  ALT 27  ALKPHOS 110  BILITOT 0.7   ------------------------------------------------------------------------------------------------------------------  ------------------------------------------------------------------------------------------------------------------ GFR: Estimated Creatinine Clearance: 54.9 mL/min (by C-G formula based on SCr of 0.69 mg/dL). Liver Function Tests: Recent Labs  Lab 12/05/18 1515  AST 26  ALT 27  ALKPHOS 110  BILITOT 0.7    PROT  6.7  ALBUMIN 3.5   Recent Labs  Lab 12/05/18 1515  LIPASE 30   No results for input(s): AMMONIA in the last 168 hours. Coagulation Profile: Recent Labs  Lab 12/05/18 1515  INR 1.5*   Cardiac Enzymes: No results for input(s): CKTOTAL, CKMB, CKMBINDEX, TROPONINI in the last 168 hours. BNP (last 3 results) No results for input(s): PROBNP in the last 8760 hours. HbA1C: No results for input(s): HGBA1C in the last 72 hours. CBG: Recent Labs  Lab 12/05/18 1949 12/05/18 2358 12/06/18 0004  GLUCAP 87 90 78   Lipid Profile: No results for input(s): CHOL, HDL, LDLCALC, TRIG, CHOLHDL, LDLDIRECT in the last 72 hours. Thyroid Function Tests: No results for input(s): TSH, T4TOTAL, FREET4, T3FREE, THYROIDAB in the last 72 hours. Anemia Panel: No results for input(s): VITAMINB12, FOLATE, FERRITIN, TIBC, IRON, RETICCTPCT in the last 72 hours.  --------------------------------------------------------------------------------------------------------------- Urine analysis:    Component Value Date/Time   COLORURINE YELLOW 12/05/2018 1912   APPEARANCEUR CLEAR 12/05/2018 1912   LABSPEC >1.046 (H) 12/05/2018 1912   PHURINE 6.0 12/05/2018 1912   GLUCOSEU NEGATIVE 12/05/2018 1912   HGBUR LARGE (A) 12/05/2018 1912   BILIRUBINUR NEGATIVE 12/05/2018 [place] NEGATIVE 12/05/2018 1912   PROTEINUR NEGATIVE 12/05/2018 1912   UROBILINOGEN 0.2 09/09/2009 1010   NITRITE NEGATIVE 12/05/2018 1912   LEUKOCYTESUR NEGATIVE 12/05/2018 1912      Imaging Results:    Dg Chest 2 View  Result Date: 12/05/2018 CLINICAL DATA:  Bright red blood in the stool and rectal pain beginning today. History of hemorrhoids. EXAM: CHEST - 2 VIEW COMPARISON:  11/19/2018 FINDINGS: Cardiac silhouette is mildly enlarged. No mediastinal or hilar masses. No evidence of adenopathy. Chronic prominence of the bronchovascular markings. Additional linear lung base opacities consistent with scarring or atelectasis. No  evidence of pneumonia or pulmonary edema. No pleural effusion or pneumothorax. Previously treated compression fracture at the thoracolumbar junction. No acute skeletal abnormality. IMPRESSION: No acute cardiopulmonary disease. Electronically Signed   By: Lajean Manes M.D.   On: 12/05/2018 18:02   Ct Abdomen Pelvis W Contrast  Result Date: 12/05/2018 CLINICAL DATA:  73 year old female with a history of GI bleed EXAM: CT ABDOMEN AND PELVIS WITH CONTRAST TECHNIQUE: Multidetector CT imaging of the abdomen and pelvis was performed using the standard protocol following bolus administration of intravenous contrast. CONTRAST:  181mL OMNIPAQUE IOHEXOL 300 MG/ML  SOLN COMPARISON:  04/21/2018 FINDINGS: Lower chest: Scarring/atelectasis at the lung bases. Hepatobiliary: Unremarkable liver.  Unremarkable gallbladder. Pancreas: Unremarkable Spleen: Unremarkable Adrenals/Urinary Tract: Unremarkable appearance of the bilateral adrenal glands. Right kidney with no hydronephrosis or nephrolithiasis. Bosniak 1 cyst on the medial cortex of the right kidney on image 22. Additional subcentimeter low-density lesion on the anterior cortex of the right kidney on image 27 too small to characterize. Left kidney without hydronephrosis. Nonobstructive nephrolithiasis at the inferior collecting system, with stone measuring approximately 4 mm. Bosniak 1 cyst on the posterior cortex of the left kidney on image 26. Bosniak 1 cyst on the medial anterior cortex of the left kidney on image 28. There are additional low-density lesions which are too small to characterize. Urinary bladder is relatively decompressed. Stomach/Bowel: Small hiatal hernia. Otherwise unremarkable stomach. Small bowel unremarkable. No abnormal distention. No focal wall thickening or inflammatory changes. No transition point. Normal appendix. Mild stool burden. Colonic diverticular disease throughout the colon. Retained contrast or inspissated secretions within numerous  diverticula. No focal inflammatory changes are evident. No fluid within the bowel or contrast accumulation. Vascular/Lymphatic:  Atherosclerotic changes of the abdominal aorta. No aneurysm or dissection. Mesenteric arteries and renal arteries are patent. Bilateral iliac arteries and proximal femoral arteries are patent. No adenopathy. Reproductive: Focal low-density fluid or tissue within the endometrial canal on image 58. Unremarkable left and right ovary. Other: Fat containing umbilical hernia with ventral diastasis. Small loop of small bowel at this site without entrapment. Musculoskeletal: Fracture of L1, treated with vertebral augmentation. Degree of posterior retropulsion similar to the comparison. Are no acute displaced fracture. IMPRESSION: No acute CT finding of the abdomen, with no CT evidence to identify source of GI hemorrhage. Colonic diverticula without evidence of acute inflammation. Hiatal hernia. Trace fluid or tissue within the endometrial canal, unexpected in a patient of this age. Referral for gynecologic follow-up is recommended, and potentially evaluation with ultrasound or MRI as early malignancy cannot be excluded. Aortic Atherosclerosis (ICD10-I70.0). Left-sided nonobstructive nephrolithiasis. Electronically Signed   By: Corrie Mckusick D.O.   On: 12/05/2018 18:05       Assessment & Plan:    Principal Problem:   Rectal bleeding Active Problems:   COPD (chronic obstructive pulmonary disease) (HCC)   Atrial flutter (HCC)   Diabetes mellitus (HCC)  Rectal bleeding NPO Type and screen Hold Xarelto GI consultation in am, appreciated (ED spoke with GI , [place])  Aflutter Hold Xarelto as above Warned patient about increase risk of CVA by holding Xarelto, but obviously with rectal bleeding prudent to stop.   Dm2 Hold metformin  Fsbs q4h, ISS  Copd Cont Trelegy Albuterol HFA 2puff q6h prn      DVT Prophylaxis-    SCD AM Labs Ordered, also please review Full  Orders  Family Communication: Admission, patients condition and plan of care including tests being ordered have been discussed with the patient  who indicate understanding and agree with the plan and Code Status.  Code Status:  FULL CODE per patient,  Pt states that her family is aware of her admission at [facility] and doesn't need me to call them  Admission status: Observation: Based on patients clinical presentation and evaluation of above clinical data, I have made determination that patient meets observationcriteria at this time.   Time spent in minutes : 55 minutes   Jani Gravel M.D on 12/06/2018 at 3:06 AM

## 2018-12-06 NOTE — Progress Notes (Signed)
Notified by central telemetry patient had short episode of rapid afib with HR in the 140s. Per telemetry, patient did not sustained. HR has maintained NSR throughout the day and returned to NSR after afib episode. This nurse entered room to find patient asleep but arousable. Dr. Carles Collet made aware, will continue to monitor.

## 2018-12-07 DIAGNOSIS — J9611 Chronic respiratory failure with hypoxia: Secondary | ICD-10-CM

## 2018-12-07 DIAGNOSIS — I4891 Unspecified atrial fibrillation: Secondary | ICD-10-CM

## 2018-12-07 DIAGNOSIS — K625 Hemorrhage of anus and rectum: Secondary | ICD-10-CM

## 2018-12-07 LAB — GLUCOSE, CAPILLARY
Glucose-Capillary: 115 mg/dL — ABNORMAL HIGH (ref 70–99)
Glucose-Capillary: 135 mg/dL — ABNORMAL HIGH (ref 70–99)
Glucose-Capillary: 158 mg/dL — ABNORMAL HIGH (ref 70–99)
Glucose-Capillary: 57 mg/dL — ABNORMAL LOW (ref 70–99)
Glucose-Capillary: 63 mg/dL — ABNORMAL LOW (ref 70–99)
Glucose-Capillary: 93 mg/dL (ref 70–99)

## 2018-12-07 LAB — BASIC METABOLIC PANEL
Anion gap: 10 (ref 5–15)
BUN: 12 mg/dL (ref 8–23)
CO2: 37 mmol/L — ABNORMAL HIGH (ref 22–32)
Calcium: 9 mg/dL (ref 8.9–10.3)
Chloride: 97 mmol/L — ABNORMAL LOW (ref 98–111)
Creatinine, Ser: 0.51 mg/dL (ref 0.44–1.00)
GFR calc Af Amer: >60 mL/min (ref >60)
GFR calc non Af Amer: >60 mL/min (ref >60)
Glucose, Bld: 111 mg/dL — ABNORMAL HIGH (ref 70–99)
Potassium: 3.4 mmol/L — ABNORMAL LOW (ref 3.5–5.1)
Sodium: 144 mmol/L (ref 135–145)

## 2018-12-07 LAB — CBC
HCT: 31.9 % — ABNORMAL LOW (ref 36.0–46.0)
Hemoglobin: 8.9 g/dL — ABNORMAL LOW (ref 12.0–15.0)
MCH: 27.3 pg (ref 26.0–34.0)
MCHC: 27.9 g/dL — ABNORMAL LOW (ref 30.0–36.0)
MCV: 97.9 fL (ref 80.0–100.0)
Platelets: 169 10*3/uL (ref 150–400)
RBC: 3.26 MIL/uL — ABNORMAL LOW (ref 3.87–5.11)
RDW: 15.5 % (ref 11.5–15.5)
WBC: 7 10*3/uL (ref 4.0–10.5)
nRBC: 0 % (ref 0.0–0.2)

## 2018-12-07 MED ORDER — POTASSIUM CHLORIDE CRYS ER 20 MEQ PO TBCR
40.0000 meq | EXTENDED_RELEASE_TABLET | Freq: Once | ORAL | Status: AC
  Administered 2018-12-07: 12:00:00 40 meq via ORAL
  Filled 2018-12-07: qty 2

## 2018-12-07 MED ORDER — MAGNESIUM SULFATE 2 GM/50ML IV SOLN
2.0000 g | Freq: Once | INTRAVENOUS | Status: AC
  Administered 2018-12-07: 12:00:00 2 g via INTRAVENOUS
  Filled 2018-12-07: qty 50

## 2018-12-07 MED ORDER — METOPROLOL TARTRATE 25 MG PO TABS
37.5000 mg | ORAL_TABLET | Freq: Two times a day (BID) | ORAL | Status: DC
  Administered 2018-12-07 – 2018-12-08 (×2): 37.5 mg via ORAL
  Filled 2018-12-07 (×4): qty 2

## 2018-12-07 MED ORDER — METOPROLOL TARTRATE 25 MG PO TABS
12.5000 mg | ORAL_TABLET | Freq: Once | ORAL | Status: AC
  Administered 2018-12-07: 12.5 mg via ORAL
  Filled 2018-12-07: qty 1

## 2018-12-07 MED ORDER — DEXTROSE 50 % IV SOLN
INTRAVENOUS | Status: AC
  Administered 2018-12-07: 50 mL
  Filled 2018-12-07: qty 50

## 2018-12-07 NOTE — Progress Notes (Signed)
PROGRESS NOTE  [name] Augello [id number] DOB: November 10, 1945 DOA: 12/05/2018 PCP: Lavella Lemons, PA  Brief History:  73 year old female with a history of COPD, chronic respiratory failure on 3.5 L, right-sided breast cancer in remission, atrial flutter on rivaroxaban, diabetes mellitus type 2, hypertension, dementia with behavior disturbance, chronic L1 fracture status post kyphoplasty presenting with hematochezia for approximately 1 week.  There have been different accounts regarding the duration of the patient's hematochezia, but the patient confirms that it has been approximately 1 week with intermittent hematochezia.  She denied any fevers, chills, abdominal pain.  Patient denies any NSAIDs or new over-the-counter medications.  She denies any headache, neck pain, chest pain, nausea, vomiting, diarrhea.  She has chronic shortness of breath which she states is actually low but better than usual.  She has a nonproductive cough.  She denies any hemoptysis. In the emergency department, the patient was afebrile hemodynamically stable saturating 99% on 3.5 L.  WBC was 9.6 with hemoglobin 10.7.  BMP was essentially unremarkable.  LFTs and lipase were unremarkable.  CT of the abdomen and pelvis showed diverticulosis with inspissated secretions and numerous diverticula.  There was no bowel wall thickening or obstruction.  GI was consulted to assist with management.  During hospitalization, pt had intermittent episodes of afib with HR up to 140.  Soft BP limited titration of metoprolol.  Pt had hypokalemia which may contributed to her afib which is being repleted  Assessment/Plan: Hematochezia -Suspect diverticular bleed -GI consult appreciated -03/24/2017 colonoscopy--sigmoid diverticulosis, internal and external hemorrhoids -Baseline hemoglobin~10 -Presented with hemoglobin 10.7 -clear liquid per GI -Judicious IV fluids -Holding rivaroxaban--restart when okay with GI  Atrial  flutter -Holding rivaroxaban -pt is having intermittent episodes of RVR with HR 130-140 -metoprolol dose was decreased due to soft BPs -try to titrate back up to home dose -keep K >/= 4 and mag >/=2 -replete K -personally reviewed tele  Chronic respiratory failure with hypoxia -Stable on 3.5 L -Start bronchodilators  COPD -Stable presently -Restart bronchodilators -start pulmicort  Diabetes mellitus type 2, uncontrolled with hyperglycemia -09/29/2018 hemoglobin A1c 8.5 -NovoLog sliding scale -Holding metformin  Dementia with behavioral disturbance -Restart Seroquel at bedtime  Essential hypertension -try to titrate back up to home dose       Disposition Plan:   Home in 1-2 days  Family Communication:  No Family at bedside  Consultants:  GI-Rehman  Code Status:  FULL   DVT Prophylaxis:  SCDs   Procedures: As Listed in Progress Note Above  Antibiotics: None    Subjective: Pt states she had a BM last night without blood in commode, but had blood on tissue.  Denies f/c, cp, worsen sob, abd pain, n/v/d, dysuria  Objective: Vitals:   12/06/18 2140 12/07/18 0550 12/07/18 0632 12/07/18 0914  BP:   112/67   Pulse:   99   Resp:      Temp:   97.8 F (36.6 C)   TempSrc:   Oral   SpO2: 90% 95% (!) 86% 96%  Weight:   66.1 kg   Height:        Intake/Output Summary (Last 24 hours) at 12/07/2018 1100 Last data filed at 12/07/2018 0900 Gross per 24 hour  Intake 360 ml  Output 650 ml  Net -290 ml   Weight change: 6.196 kg Exam:   General:  Pt is alert, follows commands appropriately, not in acute distress  HEENT: No icterus, No thrush, No  neck mass, [place]/AT  Cardiovascular: RRR, S1/S2, no rubs, no gallops  Respiratory: diminished breath sounds. No wheeze.  Bibasilar rales  Abdomen: Soft/+BS, non tender, non distended, no guarding  Extremities: No edema, No lymphangitis, No petechiae, No rashes, no synovitis   Data Reviewed: I  have personally reviewed following labs and imaging studies Basic Metabolic Panel: Recent Labs  Lab 12/05/18 1515 12/06/18 0700 12/07/18 0701  NA 140 141 144  K 5.1 4.2 3.4*  CL 89* 95* 97*  CO2 37* 38* 37*  GLUCOSE 119* 168* 111*  BUN 10 11 12   CREATININE 0.69 0.55 0.51  CALCIUM 9.9 8.9 9.0   Liver Function Tests: Recent Labs  Lab 12/05/18 1515 12/06/18 0700  AST 26 21  ALT 27 21  ALKPHOS 110 83  BILITOT 0.7 0.5  PROT 6.7 5.4*  ALBUMIN 3.5 2.8*   Recent Labs  Lab 12/05/18 1515  LIPASE 30   No results for input(s): AMMONIA in the last 168 hours. Coagulation Profile: Recent Labs  Lab 12/05/18 1515  INR 1.5*   CBC: Recent Labs  Lab 12/05/18 1515 12/06/18 0700 12/07/18 0701  WBC 9.6 7.2 7.0  NEUTROABS 6.2  --   --   HGB 10.7* 9.0* 8.9*  HCT 38.4 33.0* 31.9*  MCV 98.0 98.8 97.9  PLT 194 159 169   Cardiac Enzymes: No results for input(s): CKTOTAL, CKMB, CKMBINDEX, TROPONINI in the last 168 hours. BNP: Invalid input(s): POCBNP CBG: Recent Labs  Lab 12/06/18 1705 12/06/18 2014 12/07/18 0008 12/07/18 0347 12/07/18 0743  GLUCAP 125* 158* 63* 57* 93   HbA1C: No results for input(s): HGBA1C in the last 72 hours. Urine analysis:    Component Value Date/Time   COLORURINE YELLOW 12/05/2018 1912   APPEARANCEUR CLEAR 12/05/2018 1912   LABSPEC >1.046 (H) 12/05/2018 1912   PHURINE 6.0 12/05/2018 1912   GLUCOSEU NEGATIVE 12/05/2018 1912   HGBUR LARGE (A) 12/05/2018 1912   BILIRUBINUR NEGATIVE 12/05/2018 [place] NEGATIVE 12/05/2018 1912   PROTEINUR NEGATIVE 12/05/2018 1912   UROBILINOGEN 0.2 09/09/2009 1010   NITRITE NEGATIVE 12/05/2018 1912   LEUKOCYTESUR NEGATIVE 12/05/2018 1912   Sepsis Labs: @LABRCNTIP (procalcitonin:4,lacticidven:4) ) Recent Results (from the past 240 hour(s))  Urine culture     Status: Abnormal (Preliminary result)   Collection Time: 12/05/18  7:12 PM   Specimen: Urine, Clean Catch  Result Value Ref Range Status    Specimen Description   Final    URINE, CLEAN CATCH Performed at [facility], [address], [place], [place] [address]    Special Requests   Final    NONE Performed at [facility], [address], [place], [place] [address]    Culture >=100,000 COLONIES/mL [place] (A)  Final   Report Status PENDING  Incomplete  SARS Coronavirus 2 [facility] order, Performed in [facility] hospital lab) Nasopharyngeal Nasopharyngeal Swab     Status: None   Collection Time: 12/05/18  7:14 PM   Specimen: Nasopharyngeal Swab  Result Value Ref Range Status   SARS Coronavirus 2 NEGATIVE NEGATIVE Final    Comment: (NOTE) If result is NEGATIVE SARS-CoV-2 target nucleic acids are NOT DETECTED. The SARS-CoV-2 RNA is generally detectable in upper and lower  respiratory specimens during the acute phase of infection. The lowest  concentration of SARS-CoV-2 viral copies this assay can detect is 250  copies / mL. A negative result does not preclude SARS-CoV-2 infection  and should not be used as the sole basis for treatment or other  patient management decisions.  A negative result may occur with  improper specimen collection / handling, submission of specimen other  than nasopharyngeal swab, presence of viral mutation(s) within the  areas targeted by this assay, and inadequate number of viral copies  (<250 copies / mL). A negative result must be combined with clinical  observations, patient history, and epidemiological information. If result is POSITIVE SARS-CoV-2 target nucleic acids are DETECTED. The SARS-CoV-2 RNA is generally detectable in upper and lower  respiratory specimens dur ing the acute phase of infection.  Positive  results are indicative of active infection with SARS-CoV-2.  Clinical  correlation with patient history and other diagnostic information is  necessary to determine patient infection status.  Positive results do  not rule out bacterial infection or co-infection with other  viruses. If result is PRESUMPTIVE POSTIVE SARS-CoV-2 nucleic acids MAY BE PRESENT.   A presumptive positive result was obtained on the submitted specimen  and confirmed on repeat testing.  While 2019 novel coronavirus  (SARS-CoV-2) nucleic acids may be present in the submitted sample  additional confirmatory testing may be necessary for epidemiological  and / or clinical management purposes  to differentiate between  SARS-CoV-2 and other Sarbecovirus currently known to infect humans.  If clinically indicated additional testing with an alternate test  methodology [phone]) is advised. The SARS-CoV-2 RNA is generally  detectable in upper and lower respiratory sp ecimens during the acute  phase of infection. The expected result is Negative. Fact Sheet for Patients:  StrictlyIdeas.no Fact Sheet for Healthcare Providers: BankingDealers.co.za This test is not yet approved or cleared by the [place] FDA and has been authorized for detection and/or diagnosis of SARS-CoV-2 by FDA under an Emergency Use Authorization (EUA).  This EUA will remain in effect (meaning this test can be used) for the duration of the COVID-19 declaration under Section 564(b)(1) of the Act, 21 U.S.C. section 360bbb-3(b)(1), unless the authorization is terminated or revoked sooner. Performed at [facility], [address], [place], [place] [address]      Scheduled Meds:  budesonide (PULMICORT) nebulizer solution  0.5 mg Nebulization BID   insulin aspart  0-9 Units Subcutaneous Q4H   ipratropium-albuterol  3 mL Nebulization Q8H   metoprolol tartrate  25 mg Oral BID   QUEtiapine  25 mg Oral q1800   Continuous Infusions:  Procedures/Studies: Dg Chest 2 View  Result Date: 12/05/2018 CLINICAL DATA:  Bright red blood in the stool and rectal pain beginning today. History of hemorrhoids. EXAM: CHEST - 2 VIEW COMPARISON:  11/19/2018 FINDINGS: Cardiac silhouette is  mildly enlarged. No mediastinal or hilar masses. No evidence of adenopathy. Chronic prominence of the bronchovascular markings. Additional linear lung base opacities consistent with scarring or atelectasis. No evidence of pneumonia or pulmonary edema. No pleural effusion or pneumothorax. Previously treated compression fracture at the thoracolumbar junction. No acute skeletal abnormality. IMPRESSION: No acute cardiopulmonary disease. Electronically Signed   By: Lajean Manes M.D.   On: 12/05/2018 18:02   Mr Brain Wo Contrast  Result Date: 11/08/2018 CLINICAL DATA:  73 year old female with unexplained altered mental status for 2 days. Encephalopathy. EXAM: MRI HEAD WITHOUT CONTRAST TECHNIQUE: Multiplanar, multiecho pulse sequences of the brain and surrounding structures were obtained without intravenous contrast. COMPARISON:  Head CTs 11/06/2018 and earlier. FINDINGS: Brain: No restricted diffusion to suggest acute infarction. No midline shift, mass effect, evidence of mass lesion, ventriculomegaly, extra-axial collection or acute intracranial hemorrhage. Cervicomedullary junction and pituitary are within normal limits. Axial T2 images are  suboptimal due to motion, and the patient discontinued the exam before coronal T2 images could be obtained. However, gray and white matter signal is largely normal for age throughout the brain. There is mild nonspecific patchy mostly periventricular white matter T2 and FLAIR hyperintensity. No cortical encephalomalacia or chronic blood products identified. The deep gray nuclei, brainstem and cerebellum appear negative. Vascular: Major intracranial vascular flow voids are preserved. Skull and upper cervical spine: Negative visible cervical spine. Visualized bone marrow signal is within normal limits. Sinuses/Orbits: Motion artifact at the orbits which appear grossly negative. Paranasal sinuses are stable and well pneumatized. Other: There is a mild right mastoid effusion  redemonstrated. Negative nasopharynx. Left mastoids are clear. Scalp and face soft tissues appear negative. IMPRESSION: 1. No acute intracranial abnormality, and largely negative for age non-contrast MRI appearance of the brain allowing for some motion degradation. 2. Mild right mastoid effusion, likely postinflammatory. Electronically Signed   By: Genevie Ann M.D.   On: 11/08/2018 15:32   Ct Abdomen Pelvis W Contrast  Result Date: 12/05/2018 CLINICAL DATA:  73 year old female with a history of GI bleed EXAM: CT ABDOMEN AND PELVIS WITH CONTRAST TECHNIQUE: Multidetector CT imaging of the abdomen and pelvis was performed using the standard protocol following bolus administration of intravenous contrast. CONTRAST:  19mL OMNIPAQUE IOHEXOL 300 MG/ML  SOLN COMPARISON:  04/21/2018 FINDINGS: Lower chest: Scarring/atelectasis at the lung bases. Hepatobiliary: Unremarkable liver.  Unremarkable gallbladder. Pancreas: Unremarkable Spleen: Unremarkable Adrenals/Urinary Tract: Unremarkable appearance of the bilateral adrenal glands. Right kidney with no hydronephrosis or nephrolithiasis. Bosniak 1 cyst on the medial cortex of the right kidney on image 22. Additional subcentimeter low-density lesion on the anterior cortex of the right kidney on image 27 too small to characterize. Left kidney without hydronephrosis. Nonobstructive nephrolithiasis at the inferior collecting system, with stone measuring approximately 4 mm. Bosniak 1 cyst on the posterior cortex of the left kidney on image 26. Bosniak 1 cyst on the medial anterior cortex of the left kidney on image 28. There are additional low-density lesions which are too small to characterize. Urinary bladder is relatively decompressed. Stomach/Bowel: Small hiatal hernia. Otherwise unremarkable stomach. Small bowel unremarkable. No abnormal distention. No focal wall thickening or inflammatory changes. No transition point. Normal appendix. Mild stool burden. Colonic diverticular  disease throughout the colon. Retained contrast or inspissated secretions within numerous diverticula. No focal inflammatory changes are evident. No fluid within the bowel or contrast accumulation. Vascular/Lymphatic: Atherosclerotic changes of the abdominal aorta. No aneurysm or dissection. Mesenteric arteries and renal arteries are patent. Bilateral iliac arteries and proximal femoral arteries are patent. No adenopathy. Reproductive: Focal low-density fluid or tissue within the endometrial canal on image 58. Unremarkable left and right ovary. Other: Fat containing umbilical hernia with ventral diastasis. Small loop of small bowel at this site without entrapment. Musculoskeletal: Fracture of L1, treated with vertebral augmentation. Degree of posterior retropulsion similar to the comparison. Are no acute displaced fracture. IMPRESSION: No acute CT finding of the abdomen, with no CT evidence to identify source of GI hemorrhage. Colonic diverticula without evidence of acute inflammation. Hiatal hernia. Trace fluid or tissue within the endometrial canal, unexpected in a patient of this age. Referral for gynecologic follow-up is recommended, and potentially evaluation with ultrasound or MRI as early malignancy cannot be excluded. Aortic Atherosclerosis (ICD10-I70.0). Left-sided nonobstructive nephrolithiasis. Electronically Signed   By: Corrie Mckusick D.O.   On: 12/05/2018 18:05   Dg Chest Port 1 View  Result Date: 11/19/2018 CLINICAL DATA:  Acute respiratory  failure with hypoxia. EXAM: PORTABLE CHEST 1 VIEW COMPARISON:  Radiograph of November 17, 2018. FINDINGS: Stable cardiomegaly. No pneumothorax is noted. Increased right basilar atelectasis or infiltrate is noted. Minimal left basilar subsegmental atelectasis is noted. Small pleural effusions may be present. Bony thorax is unremarkable. IMPRESSION: Increased right basilar opacity as described above. Electronically Signed   By: Marijo Conception M.D.   On: 11/19/2018  07:29   Dg Chest Port 1 View  Result Date: 11/17/2018 CLINICAL DATA:  Acute respiratory failure.  History of COPD. EXAM: PORTABLE CHEST 1 VIEW COMPARISON:  Single-view of the chest 11/15/2018 and 11/06/2018. CT chest 09/27/2018. FINDINGS: The lungs are emphysematous. Right basilar opacity seen on the most recent examination has resolved. Minimal atelectasis left lung base noted. Heart size is enlarged. No pneumothorax or pleural fluid. IMPRESSION: Resolved right basilar airspace opacity. No change in left basilar subsegmental atelectasis. Emphysema. Atherosclerosis. Electronically Signed   By: Inge Rise M.D.   On: 11/17/2018 09:06   Dg Chest Portable 1 View  Result Date: 11/16/2018 CLINICAL DATA:  Respiratory distress EXAM: PORTABLE CHEST 1 VIEW COMPARISON:  November 06, 2018 FINDINGS: There is mild cardiomegaly. Streaky airspace opacity seen at the right lung base. The left lung is clear. No acute osseous abnormality. IMPRESSION: Streaky airspace opacity at the right lung base which could be due to atelectasis and/or early infectious etiology. Electronically Signed   By: Prudencio Pair M.D.   On: 11/16/2018 00:07   Dg Swallowing Func-speech Pathology  Result Date: 11/21/2018 Objective Swallowing Evaluation: Type of Study: MBS-Modified Barium Swallow Study  Patient Details Name: [name] Bou MRN: [id number] Date of Birth: 05-30-1945 Today's Date: 11/21/2018 Time: SLP Start Time (ACUTE ONLY): 1210 -SLP Stop Time (ACUTE ONLY): [id number] SLP Time Calculation (min) (ACUTE ONLY): 35 min Past Medical History: Past Medical History: Diagnosis Date  Asthma   Atrial flutter ([place])   Cancer ([place])   Right breast  COPD (chronic obstructive pulmonary disease) ([place])   Dementia ([place])   History of breast cancer   right breast  Hypertension   On home O2   Type 2 diabetes mellitus ([place])  Past Surgical History: Past Surgical History: Procedure Laterality Date  BREAST SURGERY  2011  Right breast mastectomy   CARDIOVERSION N/A 06/27/2018  Procedure: CARDIOVERSION;  Surgeon: Arnoldo Lenis, MD;  [facility];  Service: Endoscopy;  Laterality: N/A;  CESAREAN SECTION    COLONOSCOPY N/A 01/22/2018  Procedure: COLONOSCOPY;  Surgeon: Rogene Houston, MD;  Location: [facility];  Service: Endoscopy;  Laterality: N/A;  HERNIA REPAIR    RIH  IR VERTEBROPLASTY LUMBAR BX INC UNI/BIL INC/INJECT/IMAGING  09/05/2018  MASTECTOMY  2011  right breast  TEE WITHOUT CARDIOVERSION N/A 06/27/2018  Procedure: TRANSESOPHAGEAL ECHOCARDIOGRAM (TEE) WITH PROPOFOL;  Surgeon: Arnoldo Lenis, MD;  Location: [facility];  Service: Endoscopy;  Laterality: N/A; HPI: [name] Lizarraga is a 73 y.o. female with a multiple medical problems as documented below but most importantly COPD and CHF who presents the emergency department today with respiratory distress.  Patient, on review of records, is here often for respiratory failure with hypoxia and hypercarbia.  Symptoms altered with that as well.  Onset tonight was similar.  EMS got to her and her sats were low they gave her albuterol brought here for further evaluation.  No further history able to be obtained secondary to patient's mental status. BSE requested.  Subjective: Pt alert and cooperative, "doing better" Assessment / Plan / Recommendation  CHL IP CLINICAL IMPRESSIONS 11/21/2018 Clinical Impression Pt presents with min oropharyngeal dysphagia characterized by min premature spillage of liquids to the valleculae and spilling to the pyriforms with occasional flash penetration without aspiration except for one episode of overt aspiration before the swallow when taking straw sips of thin liquids to due premature spillage and suspected breath inhalation. Aspiration was silent and was not removed despite SLP prompts for coughing. Aspiration of thins with a straw was mitigated through use of chin tuck and small sips. Recommend regular textures and thin liquids, NO STRAWS, swallow  2x for each swallow and clear throat periodically. Pt appreciative for recommendations and verbalized that she will take small cup sips of liquids. SLP will follow for diet tolerance x1, however no SLP f/u post acute necessary at this time.  SLP Visit Diagnosis Dysphagia, oropharyngeal phase (R13.12) Attention and concentration deficit following -- Frontal lobe and executive function deficit following -- Impact on safety and function Mild aspiration risk   CHL IP TREATMENT RECOMMENDATION 11/21/2018 Treatment Recommendations Therapy as outlined in treatment plan below   Prognosis 11/21/2018 Prognosis for Safe Diet Advancement Good Barriers to Reach Goals -- Barriers/Prognosis Comment -- CHL IP DIET RECOMMENDATION 11/21/2018 SLP Diet Recommendations Dysphagia 3 (Mech soft) solids;Thin liquid Liquid Administration via Cup;No straw Medication Administration Whole meds with puree Compensations Slow rate;Small sips/bites Postural Changes Remain semi-upright after after feeds/meals (Comment);Seated upright at 90 degrees   CHL IP OTHER RECOMMENDATIONS 11/21/2018 Recommended Consults -- Oral Care Recommendations Oral care BID;Staff/trained caregiver to provide oral care Other Recommendations Clarify dietary restrictions   CHL IP FOLLOW UP RECOMMENDATIONS 11/21/2018 Follow up Recommendations None   CHL IP FREQUENCY AND DURATION 11/21/2018 Speech Therapy Frequency (ACUTE ONLY) min 2x/week Treatment Duration 1 week      CHL IP ORAL PHASE 11/21/2018 Oral Phase Impaired Oral - Pudding Teaspoon -- Oral - Pudding Cup -- Oral - Honey Teaspoon -- Oral - Honey Cup -- Oral - Nectar Teaspoon -- Oral - Nectar Cup -- Oral - Nectar Straw -- Oral - Thin Teaspoon -- Oral - Thin Cup -- Oral - Thin Straw -- Oral - Puree -- Oral - Mech Soft -- Oral - Regular -- Oral - Multi-Consistency -- Oral - Pill -- Oral Phase - Comment --  CHL IP PHARYNGEAL PHASE 11/21/2018 Pharyngeal Phase Impaired Pharyngeal- Pudding Teaspoon -- Pharyngeal -- Pharyngeal- Pudding  Cup -- Pharyngeal -- Pharyngeal- Honey Teaspoon -- Pharyngeal -- Pharyngeal- Honey Cup -- Pharyngeal -- Pharyngeal- Nectar Teaspoon -- Pharyngeal -- Pharyngeal- Nectar Cup -- Pharyngeal -- Pharyngeal- Nectar Straw -- Pharyngeal -- Pharyngeal- Thin Teaspoon Delayed swallow initiation-vallecula;Delayed swallow initiation-pyriform sinuses Pharyngeal -- Pharyngeal- Thin Cup Delayed swallow initiation-vallecula;Delayed swallow initiation-pyriform sinuses;Penetration/Aspiration during swallow Pharyngeal Material enters airway, CONTACTS cords and then ejected out;Material does not enter airway Pharyngeal- Thin Straw Delayed swallow initiation-pyriform sinuses;Penetration/Aspiration before swallow;Reduced airway/laryngeal closure;Trace aspiration;Moderate aspiration Pharyngeal Material enters airway, passes BELOW cords without attempt by patient to eject out (silent aspiration) Pharyngeal- Puree Delayed swallow initiation-vallecula Pharyngeal -- Pharyngeal- Mechanical Soft -- Pharyngeal -- Pharyngeal- Regular Delayed swallow initiation-vallecula Pharyngeal -- Pharyngeal- Multi-consistency -- Pharyngeal -- Pharyngeal- Pill Penetration/Aspiration during swallow Pharyngeal Material enters airway, remains ABOVE vocal cords then ejected out Pharyngeal Comment mild/mod aspiration of straw sips thin liquid before the swallow due to premature spillage and reduced vocal fold closure, silent  CHL IP CERVICAL ESOPHAGEAL PHASE 11/21/2018 Cervical Esophageal Phase WFL Pudding Teaspoon -- Pudding Cup -- Honey Teaspoon -- Honey Cup -- Nectar Teaspoon -- Nectar Cup -- Nectar Straw --  Thin Teaspoon -- Thin Cup -- Thin Straw -- Puree -- Mechanical Soft -- Regular -- Multi-consistency -- Pill -- Cervical Esophageal Comment -- Thank you, [name], [place] 11/21/2018, 5:59 PM               Orson Eva, DO  [facility] Pager [phone]  If 7PM-7AM, please contact night-coverage [email or web address] Password  [email or web address] 12/07/2018, 11:00 AM   LOS: 1 day

## 2018-12-07 NOTE — Plan of Care (Signed)
  Problem: Education: Goal: Knowledge of General Education information will improve Description Including pain rating scale, medication(s)/side effects and non-pharmacologic comfort measures Outcome: Progressing   

## 2018-12-07 NOTE — Progress Notes (Signed)
Ambulated with walker and standby earlier to bathroom and had medium, dark brown bm with no frank blood.  Heart rate stayed in upper 90's while ambulating.  Texted Dr. Carles Collet with this information.

## 2018-12-07 NOTE — Progress Notes (Signed)
Patient without complaints.  No abdominal pain no further bleeding She is a little hungry. Hemoglobin stable at 8.9 this morning.    Vital signs in last 24 hours: Temp:  [97.8 F (36.6 C)-98.1 F (36.7 C)] 97.8 F (36.6 C) (09/26 [id number]) Pulse Rate:  [57-99] 99 (09/26 0632) Resp:  [18] 18 (09/25 2123) BP: (94-112)/(51-67) 112/67 (09/26 [id number]) SpO2:  [86 %-100 %] 96 % (09/26 0914) Weight:  [66.1 kg] 66.1 kg (09/26 [id number]) Last BM Date: 12/05/18 General:   Alert,  pleasant and cooperative in NAD Abdomen:  Soft, nontender and nondistended.  Normal bowel sounds, without guarding, and without rebound.  No mass or organomegaly.    Intake/Output from previous day: No intake/output data recorded. Intake/Output this shift: Total I/O In: 360 [P.O.:360] Out: 650 [Urine:650]  Lab Results: Recent Labs    12/05/18 1515 12/06/18 0700 12/07/18 0701  WBC 9.6 7.2 7.0  HGB 10.7* 9.0* 8.9*  HCT 38.4 33.0* 31.9*  PLT 194 159 169   BMET Recent Labs    12/05/18 1515 12/06/18 0700 12/07/18 0701  NA 140 141 144  K 5.1 4.2 3.4*  CL 89* 95* 97*  CO2 37* 38* 37*  GLUCOSE 119* 168* 111*  BUN 10 11 12   CREATININE 0.69 0.55 0.51  CALCIUM 9.9 8.9 9.0   LFT Recent Labs    12/06/18 0700  PROT 5.4*  ALBUMIN 2.8*  AST 21  ALT 21  ALKPHOS 83  BILITOT 0.5   PT/INR Recent Labs    12/05/18 1515  LABPROT 17.6*  INR 1.5*   Hepatitis Panel No results for input(s): HEPBSAG, HCVAB, HEPAIGM, HEPBIGM in the last 72 hours. C-Diff No results for input(s): CDIFFTOX in the last 72 hours.  Studies/Results: Dg Chest 2 View  Result Date: 12/05/2018 CLINICAL DATA:  Bright red blood in the stool and rectal pain beginning today. History of hemorrhoids. EXAM: CHEST - 2 VIEW COMPARISON:  11/19/2018 FINDINGS: Cardiac silhouette is mildly enlarged. No mediastinal or hilar masses. No evidence of adenopathy. Chronic prominence of the bronchovascular markings. Additional linear lung base opacities  consistent with scarring or atelectasis. No evidence of pneumonia or pulmonary edema. No pleural effusion or pneumothorax. Previously treated compression fracture at the thoracolumbar junction. No acute skeletal abnormality. IMPRESSION: No acute cardiopulmonary disease. Electronically Signed   By: Lajean Manes M.D.   On: 12/05/2018 18:02   Ct Abdomen Pelvis W Contrast  Result Date: 12/05/2018 CLINICAL DATA:  73 year old female with a history of GI bleed EXAM: CT ABDOMEN AND PELVIS WITH CONTRAST TECHNIQUE: Multidetector CT imaging of the abdomen and pelvis was performed using the standard protocol following bolus administration of intravenous contrast. CONTRAST:  113mL OMNIPAQUE IOHEXOL 300 MG/ML  SOLN COMPARISON:  04/21/2018 FINDINGS: Lower chest: Scarring/atelectasis at the lung bases. Hepatobiliary: Unremarkable liver.  Unremarkable gallbladder. Pancreas: Unremarkable Spleen: Unremarkable Adrenals/Urinary Tract: Unremarkable appearance of the bilateral adrenal glands. Right kidney with no hydronephrosis or nephrolithiasis. Bosniak 1 cyst on the medial cortex of the right kidney on image 22. Additional subcentimeter low-density lesion on the anterior cortex of the right kidney on image 27 too small to characterize. Left kidney without hydronephrosis. Nonobstructive nephrolithiasis at the inferior collecting system, with stone measuring approximately 4 mm. Bosniak 1 cyst on the posterior cortex of the left kidney on image 26. Bosniak 1 cyst on the medial anterior cortex of the left kidney on image 28. There are additional low-density lesions which are too small to characterize. Urinary bladder is relatively decompressed. Stomach/Bowel: Small hiatal  hernia. Otherwise unremarkable stomach. Small bowel unremarkable. No abnormal distention. No focal wall thickening or inflammatory changes. No transition point. Normal appendix. Mild stool burden. Colonic diverticular disease throughout the colon. Retained contrast  or inspissated secretions within numerous diverticula. No focal inflammatory changes are evident. No fluid within the bowel or contrast accumulation. Vascular/Lymphatic: Atherosclerotic changes of the abdominal aorta. No aneurysm or dissection. Mesenteric arteries and renal arteries are patent. Bilateral iliac arteries and proximal femoral arteries are patent. No adenopathy. Reproductive: Focal low-density fluid or tissue within the endometrial canal on image 58. Unremarkable left and right ovary. Other: Fat containing umbilical hernia with ventral diastasis. Small loop of small bowel at this site without entrapment. Musculoskeletal: Fracture of L1, treated with vertebral augmentation. Degree of posterior retropulsion similar to the comparison. Are no acute displaced fracture. IMPRESSION: No acute CT finding of the abdomen, with no CT evidence to identify source of GI hemorrhage. Colonic diverticula without evidence of acute inflammation. Hiatal hernia. Trace fluid or tissue within the endometrial canal, unexpected in a patient of this age. Referral for gynecologic follow-up is recommended, and potentially evaluation with ultrasound or MRI as early malignancy cannot be excluded. Aortic Atherosclerosis (ICD10-I70.0). Left-sided nonobstructive nephrolithiasis. Electronically Signed   By: Corrie Mckusick D.O.   On: 12/05/2018 18:05    Impression: Hematochezia appears to have resolved.  Diverticular etiology likely in a setting of anticoagulation.  No need for endoscopic intervention at this time.  Recommendations: Advance diet.  Observe another 24 hours. Would be reasonable to resume anticoagulation in 5 days. If she rebleeds, may need to reconsider the risks versus benefits of chronic anticoagulation therapy.

## 2018-12-07 NOTE — Progress Notes (Signed)
Ambulated with walker and standby assist to bathroom and had medium BM which was dark brown and no frank blood.  While ambulating heart rate stayed in upper 90's.  Dr. Carles Collet made aware.

## 2018-12-08 ENCOUNTER — Inpatient Hospital Stay (HOSPITAL_COMMUNITY): Payer: Medicare Other

## 2018-12-08 DIAGNOSIS — J441 Chronic obstructive pulmonary disease with (acute) exacerbation: Secondary | ICD-10-CM

## 2018-12-08 DIAGNOSIS — I951 Orthostatic hypotension: Secondary | ICD-10-CM

## 2018-12-08 LAB — BASIC METABOLIC PANEL
Anion gap: 8 (ref 5–15)
BUN: 14 mg/dL (ref 8–23)
CO2: 37 mmol/L — ABNORMAL HIGH (ref 22–32)
Calcium: 9.1 mg/dL (ref 8.9–10.3)
Chloride: 100 mmol/L (ref 98–111)
Creatinine, Ser: 0.6 mg/dL (ref 0.44–1.00)
GFR calc Af Amer: >60 mL/min (ref >60)
GFR calc non Af Amer: >60 mL/min (ref >60)
Glucose, Bld: 129 mg/dL — ABNORMAL HIGH (ref 70–99)
Potassium: 4.2 mmol/L (ref 3.5–5.1)
Sodium: 145 mmol/L (ref 135–145)

## 2018-12-08 LAB — URINE CULTURE: Culture: >=100000 — AB

## 2018-12-08 LAB — CBC
HCT: 34.1 % — ABNORMAL LOW (ref 36.0–46.0)
Hemoglobin: 9.4 g/dL — ABNORMAL LOW (ref 12.0–15.0)
MCH: 27.5 pg (ref 26.0–34.0)
MCHC: 27.6 g/dL — ABNORMAL LOW (ref 30.0–36.0)
MCV: 99.7 fL (ref 80.0–100.0)
Platelets: 180 10*3/uL (ref 150–400)
RBC: 3.42 MIL/uL — ABNORMAL LOW (ref 3.87–5.11)
RDW: 15.7 % — ABNORMAL HIGH (ref 11.5–15.5)
WBC: 7.2 10*3/uL (ref 4.0–10.5)
nRBC: 0 % (ref 0.0–0.2)

## 2018-12-08 LAB — GLUCOSE, CAPILLARY
Glucose-Capillary: 102 mg/dL — ABNORMAL HIGH (ref 70–99)
Glucose-Capillary: 135 mg/dL — ABNORMAL HIGH (ref 70–99)
Glucose-Capillary: 163 mg/dL — ABNORMAL HIGH (ref 70–99)
Glucose-Capillary: 167 mg/dL — ABNORMAL HIGH (ref 70–99)
Glucose-Capillary: 212 mg/dL — ABNORMAL HIGH (ref 70–99)
Glucose-Capillary: 97 mg/dL (ref 70–99)

## 2018-12-08 LAB — MAGNESIUM: Magnesium: 1.7 mg/dL (ref 1.7–2.4)

## 2018-12-08 MED ORDER — METHYLPREDNISOLONE SODIUM SUCC 125 MG IJ SOLR
60.0000 mg | Freq: Three times a day (TID) | INTRAMUSCULAR | Status: DC
  Administered 2018-12-08 – 2018-12-11 (×9): 60 mg via INTRAVENOUS
  Filled 2018-12-08 (×9): qty 2

## 2018-12-08 NOTE — Progress Notes (Signed)
Patient asleep on 3 lpm/[place] ,gave nebs saturation 94 f28 no arousal to limited stimulus -- placing mask on face etc. Will watch through night possibly decrease oxygen if needed. Marland Kitchen

## 2018-12-08 NOTE — Progress Notes (Signed)
   12/08/18 1125  What Happened  Was fall witnessed? Yes  Who witnessed fall? Eileen [name]  Patients activity before fall other (comment) (sitting in chair)  Point of contact buttocks  Was patient injured? No  Follow Up  MD notified Dr. Carles Collet  Time MD notified 28  Family notified Yes - comment  Time family notified 1150  Adult Fall Risk Assessment  Risk Factor Category (scoring not indicated) High fall risk per protocol (document High fall risk)  Age 73  Fall History: Fall within 6 months prior to admission 5  Elimination; Bowel and/or Urine Incontinence 2  Elimination; Bowel and/or Urine Urgency/Frequency 2  Medications: includes PCA/Opiates, Anti-convulsants, Anti-hypertensives, Diuretics, Hypnotics, Laxatives, Sedatives, and Psychotropics 0  Patient Care Equipment 1  Mobility-Assistance 2  Mobility-Gait 2  Mobility-Sensory Deficit 2  Altered awareness of immediate physical environment 1  Impulsiveness 2  Lack of understanding of one's physical/cognitive limitations 4  Total Score 25  Patient Fall Risk Level High fall risk  Adult Fall Risk Interventions  Required Bundle Interventions *See Row Information* High fall risk - low, moderate, and high requirements implemented  Additional Interventions Use of appropriate toileting equipment (bedpan, BSC, etc.)  Screening for Fall Injury Risk (To be completed on HIGH fall risk patients) - Assessing Need for Low Bed  Risk For Fall Injury- Low Bed Criteria None identified - Continue screening  Screening for Fall Injury Risk (To be completed on HIGH fall risk patients who do not meet crieteria for Low Bed) - Assessing Need for Floor Mats Only  Risk For Fall Injury- Criteria for Floor Mats Confusion/dementia (+NuDESC, CIWA, TBI, etc.)  Will Implement Floor Mats Yes  Vitals  Temp 97.6 F (36.4 C)  BP 101/78  Pulse Rate 88  Resp 18  Oxygen Therapy  SpO2 95 %  O2 Device Nasal Cannula  O2 Flow Rate (L/min) 3.5 L/min  Pain Assessment   Pain Score 0  Neurological  Neuro (WDL) X  Level of Consciousness Alert  Orientation Level Oriented to person;Oriented to time;Oriented to situation  Cognition Appropriate at baseline;Impulsive;Poor judgement  Speech Clear  Glasgow Coma Scale  Eye Opening 4  Best Verbal Response (NON-intubated) 5  Best Motor Response 6  Musculoskeletal  Musculoskeletal (WDL) X  Assistive Device BSC  Generalized Weakness Yes  Weight Bearing Restrictions No  Integumentary  Integumentary (WDL) WDL  Skin Color Appropriate for ethnicity  Skin Condition Dry  Skin Integrity Intact;MASD  Ecchymosis Location Arm;Hand  Ecchymosis Location Orientation Right  Moisture Associated Skin Damage Location Buttocks  Moisture Associated Skin Damage Orientation Right;Left  Moisture Associated Skin Damage Intervention Cleansed  Rash Location Back  Rash Location Orientation Mid;Upper  Skin Turgor Non-tenting

## 2018-12-08 NOTE — Progress Notes (Signed)
PROGRESS NOTE  Eileen [name] [id number] DOB: [birth year] DOA: 12/05/2018 PCP: Eileen Lemons, PA  Brief History: 73 year old female with a history of COPD, chronic respiratory failure on 3.5 L, right-sided breast cancer in remission, atrial flutter on rivaroxaban, diabetes mellitus type 2, hypertension, dementia with behavior disturbance, chronic L1 fracture status post kyphoplasty presenting with hematochezia for approximately 1 week. There have been different accounts regarding the duration of the patient's hematochezia, but the patient confirms that it has been approximately 1 week with intermittent hematochezia. She denied any fevers, chills, abdominal pain.Patient denies any NSAIDs or new over-the-counter medications. She denies any headache, neck pain, chest pain, nausea, vomiting, diarrhea. She has chronic shortness of breath which she states is actually low but better than usual. She has a nonproductive cough. She denies any hemoptysis. In the emergency department, the patient was afebrile hemodynamically stable saturating 99% on 3.5 L. WBC was 9.6 with hemoglobin 10.7. BMP was essentially unremarkable. LFTs and lipase were unremarkable. CT of the abdomen and pelvis showed diverticulosis with inspissated secretions and numerous diverticula. There was no bowel wall thickening or obstruction. GI was consulted to assist with management.  During hospitalization, pt had intermittent episodes of afib with HR up to 140.  Soft BP limited titration of metoprolol.  Pt had hypokalemia which may contributed to her afib which is being repleted  Assessment/Plan: Hematochezia -Suspect diverticular bleed -no further bleeding -diet advanced -GI consult appreciated -03/24/2017 colonoscopy--sigmoid diverticulosis, internal and external hemorrhoids -Baseline hemoglobin~10 -Presented with hemoglobin 10.7 -clear liquid per GI -Judicious IV fluids -Holding rivaroxaban--restart  when okay with GI  Atrial flutter -Holding rivaroxaban -pt is having intermittent episodes of RVR with HR 130-140-->improving with titration metoprolol back up to home dose -metoprolol dose was decreased due to soft BPs -try to titrate back up to home dose -keep K >/= 4 and mag >/=2 -replete K -personally reviewed tele--no afib last 24 hours  Chronic respiratory failure with hypoxia -Stable on 3.5 L -Start bronchodilators  COPD Exacerbation -9/27--pt more dyspneic with wheeze -CXR -EKG -continue bronchodilators -continue pulmicort -start IV solumedrol  Diabetes mellitus type 2, uncontrolled with hyperglycemia -09/29/2018 hemoglobin A1c 8.5 -NovoLog sliding scale -Holding metformin  Dementia with behavioral disturbance -Restarted Seroquel at bedtime  Essential hypertension -try to titrate back up to home dose       Disposition Plan: Home in 1-2days  Family Communication:NoFamily at bedside  Consultants:GI-Rehman  Code Status: FULL   DVT Prophylaxis: SCDs   Procedures: As Listed in Progress Note Above  Antibiotics: None      Subjective: Pt c/o sob a little more today.  Denies f/c, cp, n/v/d, abd pain.  No hematochezia or melena  Objective: Vitals:   12/08/18 0510 12/08/18 0532 12/08/18 0912 12/08/18 1315  BP: (!) 96/56   113/70  Pulse: 81   89  Resp: 18   18  Temp: (!) 97.4 F (36.3 C)   97.6 F (36.4 C)  TempSrc: Oral   Oral  SpO2: 98% 90% 90% 93%  Weight:      Height:        Intake/Output Summary (Last 24 hours) at 12/08/2018 1423 Last data filed at 12/08/2018 0857 Gross per 24 hour  Intake 480 ml  Output 450 ml  Net 30 ml   Weight change:  Exam:   General:  Pt is alert, follows commands appropriately, not in acute distress  HEENT: No icterus, No thrush, No neck mass, [place]/AT  Cardiovascular: RRR, S1/S2, no rubs, no gallops  Respiratory: CTA bilaterally, no wheezing, no crackles, no  rhonchi  Abdomen: Soft/+BS, non tender, non distended, no guarding  Extremities: No edema, No lymphangitis, No petechiae, No rashes, no synovitis   Data Reviewed: I have personally reviewed following labs and imaging studies Basic Metabolic Panel: Recent Labs  Lab 12/05/18 1515 12/06/18 0700 12/07/18 0701 12/08/18 0626  NA 140 141 144 145  K 5.1 4.2 3.4* 4.2  CL 89* 95* 97* 100  CO2 37* 38* 37* 37*  GLUCOSE 119* 168* 111* 129*  BUN 10 11 12 14   CREATININE 0.69 0.55 0.51 0.60  CALCIUM 9.9 8.9 9.0 9.1  MG  --   --   --  1.7   Liver Function Tests: Recent Labs  Lab 12/05/18 1515 12/06/18 0700  AST 26 21  ALT 27 21  ALKPHOS 110 83  BILITOT 0.7 0.5  PROT 6.7 5.4*  ALBUMIN 3.5 2.8*   Recent Labs  Lab 12/05/18 1515  LIPASE 30   No results for input(s): AMMONIA in the last 168 hours. Coagulation Profile: Recent Labs  Lab 12/05/18 1515  INR 1.5*   CBC: Recent Labs  Lab 12/05/18 1515 12/06/18 0700 12/07/18 0701 12/08/18 0626  WBC 9.6 7.2 7.0 7.2  NEUTROABS 6.2  --   --   --   HGB 10.7* 9.0* 8.9* 9.4*  HCT 38.4 33.0* 31.9* 34.1*  MCV 98.0 98.8 97.9 99.7  PLT 194 159 169 180   Cardiac Enzymes: No results for input(s): CKTOTAL, CKMB, CKMBINDEX, TROPONINI in the last 168 hours. BNP: Invalid input(s): POCBNP CBG: Recent Labs  Lab 12/07/18 2013 12/08/18 0011 12/08/18 0404 12/08/18 0717 12/08/18 1102  GLUCAP 158* 97 167* 102* 212*   HbA1C: No results for input(s): HGBA1C in the last 72 hours. Urine analysis:    Component Value Date/Time   COLORURINE YELLOW 12/05/2018 1912   APPEARANCEUR CLEAR 12/05/2018 1912   LABSPEC >1.046 (H) 12/05/2018 1912   PHURINE 6.0 12/05/2018 1912   GLUCOSEU NEGATIVE 12/05/2018 1912   HGBUR LARGE (A) 12/05/2018 1912   BILIRUBINUR NEGATIVE 12/05/2018 [place] NEGATIVE 12/05/2018 1912   PROTEINUR NEGATIVE 12/05/2018 1912   UROBILINOGEN 0.2 09/09/2009 1010   NITRITE NEGATIVE 12/05/2018 1912   LEUKOCYTESUR  NEGATIVE 12/05/2018 1912   Sepsis Labs: @LABRCNTIP (procalcitonin:4,lacticidven:4) ) Recent Results (from the past 240 hour(s))  Urine culture     Status: Abnormal   Collection Time: 12/05/18  7:12 PM   Specimen: Urine, Clean Catch  Result Value Ref Range Status   Specimen Description   Final    URINE, CLEAN CATCH Performed at [facility], [address] [place], [place] [address]    Special Requests   Final    NONE Performed at [facility], [address] [place], [place] [address]    Culture (A)  Final    >=100,000 COLONIES/mL ESCHERICHIA COLI Confirmed Extended Spectrum Beta-Lactamase Producer (ESBL).  In bloodstream infections from ESBL organisms, carbapenems are preferred over piperacillin/tazobactam. They are shown to have a lower risk of mortality.    Report Status 12/08/2018 FINAL  Final   Organism ID, Bacteria ESCHERICHIA COLI (A)  Final      Susceptibility   Escherichia coli - MIC*    AMPICILLIN >=32 RESISTANT Resistant     CEFAZOLIN >=64 RESISTANT Resistant     CEFTRIAXONE >=64 RESISTANT Resistant     CIPROFLOXACIN >=4 RESISTANT Resistant     GENTAMICIN <=1 SENSITIVE Sensitive     IMIPENEM <=  0.25 SENSITIVE Sensitive     NITROFURANTOIN <=16 SENSITIVE Sensitive     TRIMETH/SULFA <=20 SENSITIVE Sensitive     AMPICILLIN/SULBACTAM 4 SENSITIVE Sensitive     PIP/TAZO <=4 SENSITIVE Sensitive     Extended ESBL POSITIVE Resistant     * >=100,000 COLONIES/mL ESCHERICHIA COLI  SARS Coronavirus 2 [facility] order, Performed in [facility] hospital lab) Nasopharyngeal Nasopharyngeal Swab     Status: None   Collection Time: 12/05/18  7:14 PM   Specimen: Nasopharyngeal Swab  Result Value Ref Range Status   SARS Coronavirus 2 NEGATIVE NEGATIVE Final    Comment: (NOTE) If result is NEGATIVE SARS-CoV-2 target nucleic acids are NOT DETECTED. The SARS-CoV-2 RNA is generally detectable in upper and lower  respiratory specimens during the acute phase of infection. The lowest   concentration of SARS-CoV-2 viral copies this assay can detect is 250  copies / mL. A negative result does not preclude SARS-CoV-2 infection  and should not be used as the sole basis for treatment or other  patient management decisions.  A negative result may occur with  improper specimen collection / handling, submission of specimen other  than nasopharyngeal swab, presence of viral mutation(s) within the  areas targeted by this assay, and inadequate number of viral copies  (<250 copies / mL). A negative result must be combined with clinical  observations, patient history, and epidemiological information. If result is POSITIVE SARS-CoV-2 target nucleic acids are DETECTED. The SARS-CoV-2 RNA is generally detectable in upper and lower  respiratory specimens dur ing the acute phase of infection.  Positive  results are indicative of active infection with SARS-CoV-2.  Clinical  correlation with patient history and other diagnostic information is  necessary to determine patient infection status.  Positive results do  not rule out bacterial infection or co-infection with other viruses. If result is PRESUMPTIVE POSTIVE SARS-CoV-2 nucleic acids MAY BE PRESENT.   A presumptive positive result was obtained on the submitted specimen  and confirmed on repeat testing.  While 2019 novel coronavirus  (SARS-CoV-2) nucleic acids may be present in the submitted sample  additional confirmatory testing may be necessary for epidemiological  and / or clinical management purposes  to differentiate between  SARS-CoV-2 and other Sarbecovirus currently known to infect humans.  If clinically indicated additional testing with an alternate test  methodology [id number]) is advised. The SARS-CoV-2 RNA is generally  detectable in upper and lower respiratory sp ecimens during the acute  phase of infection. The expected result is Negative. Fact Sheet for Patients:  StrictlyIdeas.no Fact Sheet  for Healthcare Providers: BankingDealers.co.za This test is not yet approved or cleared by the [place] FDA and has been authorized for detection and/or diagnosis of SARS-CoV-2 by FDA under an Emergency Use Authorization (EUA).  This EUA will remain in effect (meaning this test can be used) for the duration of the COVID-19 declaration under Section 564(b)(1) of the Act, 21 U.S.C. section 360bbb-3(b)(1), unless the authorization is terminated or revoked sooner. Performed at [facility], [address], [place], [place] [address]      Scheduled Meds:  budesonide (PULMICORT) nebulizer solution  0.5 mg Nebulization BID   ipratropium-albuterol  3 mL Nebulization Q8H   methylPREDNISolone (SOLU-MEDROL) injection  60 mg Intravenous Q8H   metoprolol tartrate  37.5 mg Oral BID   QUEtiapine  25 mg Oral q1800   Continuous Infusions:  Procedures/Studies: Dg Chest 2 View  Result Date: 12/05/2018 CLINICAL DATA:  Bright red blood in the stool and rectal  pain beginning today. History of hemorrhoids. EXAM: CHEST - 2 VIEW COMPARISON:  11/19/2018 FINDINGS: Cardiac silhouette is mildly enlarged. No mediastinal or hilar masses. No evidence of adenopathy. Chronic prominence of the bronchovascular markings. Additional linear lung base opacities consistent with scarring or atelectasis. No evidence of pneumonia or pulmonary edema. No pleural effusion or pneumothorax. Previously treated compression fracture at the thoracolumbar junction. No acute skeletal abnormality. IMPRESSION: No acute cardiopulmonary disease. Electronically Signed   By: Lajean Manes M.D.   On: 12/05/2018 18:02   Mr Brain Wo Contrast  Result Date: 11/08/2018 CLINICAL DATA:  73 year old female with unexplained altered mental status for 2 days. Encephalopathy. EXAM: MRI HEAD WITHOUT CONTRAST TECHNIQUE: Multiplanar, multiecho pulse sequences of the brain and surrounding structures were obtained without intravenous  contrast. COMPARISON:  Head CTs 11/06/2018 and earlier. FINDINGS: Brain: No restricted diffusion to suggest acute infarction. No midline shift, mass effect, evidence of mass lesion, ventriculomegaly, extra-axial collection or acute intracranial hemorrhage. Cervicomedullary junction and pituitary are within normal limits. Axial T2 images are suboptimal due to motion, and the patient discontinued the exam before coronal T2 images could be obtained. However, gray and white matter signal is largely normal for age throughout the brain. There is mild nonspecific patchy mostly periventricular white matter T2 and FLAIR hyperintensity. No cortical encephalomalacia or chronic blood products identified. The deep gray nuclei, brainstem and cerebellum appear negative. Vascular: Major intracranial vascular flow voids are preserved. Skull and upper cervical spine: Negative visible cervical spine. Visualized bone marrow signal is within normal limits. Sinuses/Orbits: Motion artifact at the orbits which appear grossly negative. Paranasal sinuses are stable and well pneumatized. Other: There is a mild right mastoid effusion redemonstrated. Negative nasopharynx. Left mastoids are clear. Scalp and face soft tissues appear negative. IMPRESSION: 1. No acute intracranial abnormality, and largely negative for age non-contrast MRI appearance of the brain allowing for some motion degradation. 2. Mild right mastoid effusion, likely postinflammatory. Electronically Signed   By: Genevie Ann M.D.   On: 11/08/2018 15:32   Ct Abdomen Pelvis W Contrast  Result Date: 12/05/2018 CLINICAL DATA:  73 year old female with a history of GI bleed EXAM: CT ABDOMEN AND PELVIS WITH CONTRAST TECHNIQUE: Multidetector CT imaging of the abdomen and pelvis was performed using the standard protocol following bolus administration of intravenous contrast. CONTRAST:  16mL OMNIPAQUE IOHEXOL 300 MG/ML  SOLN COMPARISON:  04/21/2018 FINDINGS: Lower chest:  Scarring/atelectasis at the lung bases. Hepatobiliary: Unremarkable liver.  Unremarkable gallbladder. Pancreas: Unremarkable Spleen: Unremarkable Adrenals/Urinary Tract: Unremarkable appearance of the bilateral adrenal glands. Right kidney with no hydronephrosis or nephrolithiasis. Bosniak 1 cyst on the medial cortex of the right kidney on image 22. Additional subcentimeter low-density lesion on the anterior cortex of the right kidney on image 27 too small to characterize. Left kidney without hydronephrosis. Nonobstructive nephrolithiasis at the inferior collecting system, with stone measuring approximately 4 mm. Bosniak 1 cyst on the posterior cortex of the left kidney on image 26. Bosniak 1 cyst on the medial anterior cortex of the left kidney on image 28. There are additional low-density lesions which are too small to characterize. Urinary bladder is relatively decompressed. Stomach/Bowel: Small hiatal hernia. Otherwise unremarkable stomach. Small bowel unremarkable. No abnormal distention. No focal wall thickening or inflammatory changes. No transition point. Normal appendix. Mild stool burden. Colonic diverticular disease throughout the colon. Retained contrast or inspissated secretions within numerous diverticula. No focal inflammatory changes are evident. No fluid within the bowel or contrast accumulation. Vascular/Lymphatic: Atherosclerotic changes of the abdominal  aorta. No aneurysm or dissection. Mesenteric arteries and renal arteries are patent. Bilateral iliac arteries and proximal femoral arteries are patent. No adenopathy. Reproductive: Focal low-density fluid or tissue within the endometrial canal on image 58. Unremarkable left and right ovary. Other: Fat containing umbilical hernia with ventral diastasis. Small loop of small bowel at this site without entrapment. Musculoskeletal: Fracture of L1, treated with vertebral augmentation. Degree of posterior retropulsion similar to the comparison. Are no  acute displaced fracture. IMPRESSION: No acute CT finding of the abdomen, with no CT evidence to identify source of GI hemorrhage. Colonic diverticula without evidence of acute inflammation. Hiatal hernia. Trace fluid or tissue within the endometrial canal, unexpected in a patient of this age. Referral for gynecologic follow-up is recommended, and potentially evaluation with ultrasound or MRI as early malignancy cannot be excluded. Aortic Atherosclerosis (ICD10-I70.0). Left-sided nonobstructive nephrolithiasis. Electronically Signed   By: Corrie Mckusick D.O.   On: 12/05/2018 18:05   Dg Chest Port 1 View  Result Date: 11/19/2018 CLINICAL DATA:  Acute respiratory failure with hypoxia. EXAM: PORTABLE CHEST 1 VIEW COMPARISON:  Radiograph of November 17, 2018. FINDINGS: Stable cardiomegaly. No pneumothorax is noted. Increased right basilar atelectasis or infiltrate is noted. Minimal left basilar subsegmental atelectasis is noted. Small pleural effusions may be present. Bony thorax is unremarkable. IMPRESSION: Increased right basilar opacity as described above. Electronically Signed   By: Marijo Conception M.D.   On: 11/19/2018 07:29   Dg Chest Port 1 View  Result Date: 11/17/2018 CLINICAL DATA:  Acute respiratory failure.  History of COPD. EXAM: PORTABLE CHEST 1 VIEW COMPARISON:  Single-view of the chest 11/15/2018 and 11/06/2018. CT chest 09/27/2018. FINDINGS: The lungs are emphysematous. Right basilar opacity seen on the most recent examination has resolved. Minimal atelectasis left lung base noted. Heart size is enlarged. No pneumothorax or pleural fluid. IMPRESSION: Resolved right basilar airspace opacity. No change in left basilar subsegmental atelectasis. Emphysema. Atherosclerosis. Electronically Signed   By: Inge Rise M.D.   On: 11/17/2018 09:06   Dg Chest Portable 1 View  Result Date: 11/16/2018 CLINICAL DATA:  Respiratory distress EXAM: PORTABLE CHEST 1 VIEW COMPARISON:  November 06, 2018 FINDINGS:  There is mild cardiomegaly. Streaky airspace opacity seen at the right lung base. The left lung is clear. No acute osseous abnormality. IMPRESSION: Streaky airspace opacity at the right lung base which could be due to atelectasis and/or early infectious etiology. Electronically Signed   By: Prudencio Pair M.D.   On: 11/16/2018 00:07   Dg Swallowing Func-speech Pathology  Result Date: 11/21/2018 Objective Swallowing Evaluation: Type of Study: MBS-Modified Barium Swallow Study  Patient Details Name: Eileen A Midgett MRN: [id number] Date of Birth: [birth year] Today's Date: 11/21/2018 Time: SLP Start Time (ACUTE ONLY): 1210 -SLP Stop Time (ACUTE ONLY): [id number] SLP Time Calculation (min) (ACUTE ONLY): 35 min Past Medical History: Past Medical History: Diagnosis Date  Asthma   Atrial flutter ([place])   Cancer ([place])   Right breast  COPD (chronic obstructive pulmonary disease) ([place])   Dementia ([place])   History of breast cancer   right breast  Hypertension   On home O2   Type 2 diabetes mellitus ([place])  Past Surgical History: Past Surgical History: Procedure Laterality Date  BREAST SURGERY  2011  Right breast mastectomy  CARDIOVERSION N/A 06/27/2018  Procedure: CARDIOVERSION;  Surgeon: Arnoldo Lenis, MD;  [facility];  Service: Endoscopy;  Laterality: N/A;  CESAREAN SECTION    COLONOSCOPY N/A 01/22/2018  Procedure: COLONOSCOPY;  Surgeon: Rogene Houston, MD;  Location: [facility];  Service: Endoscopy;  Laterality: N/A;  HERNIA REPAIR    RIH  IR VERTEBROPLASTY LUMBAR BX INC UNI/BIL INC/INJECT/IMAGING  09/05/2018  MASTECTOMY  2011  right breast  TEE WITHOUT CARDIOVERSION N/A 06/27/2018  Procedure: TRANSESOPHAGEAL ECHOCARDIOGRAM (TEE) WITH PROPOFOL;  Surgeon: Arnoldo Lenis, MD;  Location: [facility];  Service: Endoscopy;  Laterality: N/A; HPI: Eileen A Bencivenga is a 73 y.o. female with a multiple medical problems as documented below but most importantly COPD and CHF who presents the emergency  department today with respiratory distress.  Patient, on review of records, is here often for respiratory failure with hypoxia and hypercarbia.  Symptoms altered with that as well.  Onset tonight was similar.  EMS got to her and her sats were low they gave her albuterol brought here for further evaluation.  No further history able to be obtained secondary to patient's mental status. BSE requested.  Subjective: Pt alert and cooperative, "doing better" Assessment / Plan / Recommendation CHL IP CLINICAL IMPRESSIONS 11/21/2018 Clinical Impression Pt presents with min oropharyngeal dysphagia characterized by min premature spillage of liquids to the valleculae and spilling to the pyriforms with occasional flash penetration without aspiration except for one episode of overt aspiration before the swallow when taking straw sips of thin liquids to due premature spillage and suspected breath inhalation. Aspiration was silent and was not removed despite SLP prompts for coughing. Aspiration of thins with a straw was mitigated through use of chin tuck and small sips. Recommend regular textures and thin liquids, NO STRAWS, swallow 2x for each swallow and clear throat periodically. Pt appreciative for recommendations and verbalized that she will take small cup sips of liquids. SLP will follow for diet tolerance x1, however no SLP f/u post acute necessary at this time.  SLP Visit Diagnosis Dysphagia, oropharyngeal phase (R13.12) Attention and concentration deficit following -- Frontal lobe and executive function deficit following -- Impact on safety and function Mild aspiration risk   CHL IP TREATMENT RECOMMENDATION 11/21/2018 Treatment Recommendations Therapy as outlined in treatment plan below   Prognosis 11/21/2018 Prognosis for Safe Diet Advancement Good Barriers to Reach Goals -- Barriers/Prognosis Comment -- CHL IP DIET RECOMMENDATION 11/21/2018 SLP Diet Recommendations Dysphagia 3 (Mech soft) solids;Thin liquid Liquid  Administration via Cup;No straw Medication Administration Whole meds with puree Compensations Slow rate;Small sips/bites Postural Changes Remain semi-upright after after feeds/meals (Comment);Seated upright at 90 degrees   CHL IP OTHER RECOMMENDATIONS 11/21/2018 Recommended Consults -- Oral Care Recommendations Oral care BID;Staff/trained caregiver to provide oral care Other Recommendations Clarify dietary restrictions   CHL IP FOLLOW UP RECOMMENDATIONS 11/21/2018 Follow up Recommendations None   CHL IP FREQUENCY AND DURATION 11/21/2018 Speech Therapy Frequency (ACUTE ONLY) min 2x/week Treatment Duration 1 week      CHL IP ORAL PHASE 11/21/2018 Oral Phase Impaired Oral - Pudding Teaspoon -- Oral - Pudding Cup -- Oral - Honey Teaspoon -- Oral - Honey Cup -- Oral - Nectar Teaspoon -- Oral - Nectar Cup -- Oral - Nectar Straw -- Oral - Thin Teaspoon -- Oral - Thin Cup -- Oral - Thin Straw -- Oral - Puree -- Oral - Mech Soft -- Oral - Regular -- Oral - Multi-Consistency -- Oral - Pill -- Oral Phase - Comment --  CHL IP PHARYNGEAL PHASE 11/21/2018 Pharyngeal Phase Impaired Pharyngeal- Pudding Teaspoon -- Pharyngeal -- Pharyngeal- Pudding Cup -- Pharyngeal -- Pharyngeal- Honey Teaspoon -- Pharyngeal -- Pharyngeal- Honey Cup -- Pharyngeal -- Pharyngeal-  Nectar Teaspoon -- Pharyngeal -- Pharyngeal- Nectar Cup -- Pharyngeal -- Pharyngeal- Nectar Straw -- Pharyngeal -- Pharyngeal- Thin Teaspoon Delayed swallow initiation-vallecula;Delayed swallow initiation-pyriform sinuses Pharyngeal -- Pharyngeal- Thin Cup Delayed swallow initiation-vallecula;Delayed swallow initiation-pyriform sinuses;Penetration/Aspiration during swallow Pharyngeal Material enters airway, CONTACTS cords and then ejected out;Material does not enter airway Pharyngeal- Thin Straw Delayed swallow initiation-pyriform sinuses;Penetration/Aspiration before swallow;Reduced airway/laryngeal closure;Trace aspiration;Moderate aspiration Pharyngeal Material enters airway,  passes BELOW cords without attempt by patient to eject out (silent aspiration) Pharyngeal- Puree Delayed swallow initiation-vallecula Pharyngeal -- Pharyngeal- Mechanical Soft -- Pharyngeal -- Pharyngeal- Regular Delayed swallow initiation-vallecula Pharyngeal -- Pharyngeal- Multi-consistency -- Pharyngeal -- Pharyngeal- Pill Penetration/Aspiration during swallow Pharyngeal Material enters airway, remains ABOVE vocal cords then ejected out Pharyngeal Comment mild/mod aspiration of straw sips thin liquid before the swallow due to premature spillage and reduced vocal fold closure, silent  CHL IP CERVICAL ESOPHAGEAL PHASE 11/21/2018 Cervical Esophageal Phase WFL Pudding Teaspoon -- Pudding Cup -- Honey Teaspoon -- Honey Cup -- Nectar Teaspoon -- Nectar Cup -- Nectar Straw -- Thin Teaspoon -- Thin Cup -- Thin Straw -- Puree -- Mechanical Soft -- Regular -- Multi-consistency -- Pill -- Cervical Esophageal Comment -- Thank you, [name], U[place] PORTER,DABNEY 11/21/2018, 5:59 PM               Orson Eva, DO  [facility] Pager [phone]  If 7PM-7AM, please contact night-coverage [email or web address] Password [email or web address] 12/08/2018, 2:23 PM   LOS: 2 days

## 2018-12-08 NOTE — Progress Notes (Signed)
Witnessed fall at 1120. Sitting in chair with bed alarm and bed alarm began sounding and as staff walked to room patient attempted to edge of chair and slid to floor. Denied pain and no apparent injury.  Dr Tat notified as well as daughter, [name].  Moved to room 328 which is closer to nurse's station.  Bed alarm set.  Encouraged to use call bell.

## 2018-12-09 LAB — GLUCOSE, CAPILLARY
Glucose-Capillary: 165 mg/dL — ABNORMAL HIGH (ref 70–99)
Glucose-Capillary: 191 mg/dL — ABNORMAL HIGH (ref 70–99)
Glucose-Capillary: 250 mg/dL — ABNORMAL HIGH (ref 70–99)
Glucose-Capillary: 318 mg/dL — ABNORMAL HIGH (ref 70–99)

## 2018-12-09 MED ORDER — FUROSEMIDE 40 MG PO TABS
40.0000 mg | ORAL_TABLET | Freq: Every day | ORAL | Status: DC
  Filled 2018-12-09 (×2): qty 1

## 2018-12-09 MED ORDER — FUROSEMIDE 10 MG/ML IJ SOLN
20.0000 mg | Freq: Once | INTRAMUSCULAR | Status: AC
  Administered 2018-12-09: 17:00:00 20 mg via INTRAVENOUS
  Filled 2018-12-09: qty 2

## 2018-12-09 MED ORDER — FUROSEMIDE 40 MG PO TABS
40.0000 mg | ORAL_TABLET | Freq: Every day | ORAL | Status: DC
  Administered 2018-12-09: 40 mg via ORAL

## 2018-12-09 MED ORDER — DILTIAZEM HCL 30 MG PO TABS
30.0000 mg | ORAL_TABLET | Freq: Four times a day (QID) | ORAL | Status: DC
  Administered 2018-12-09 – 2018-12-10 (×3): 30 mg via ORAL
  Filled 2018-12-09 (×3): qty 1

## 2018-12-09 NOTE — Progress Notes (Signed)
Pt has been real drowsy for most of the night. Pt was not alert enough to take PO medications but her vital signs have been stable and so has her CBG. Will continue to monitor patient.

## 2018-12-09 NOTE — Progress Notes (Signed)
Patient stated she is ok saturation 98 on 3 liters , decreased to 2.5

## 2018-12-09 NOTE — Care Management Important Message (Signed)
Important Message  Patient Details  Name: Eileen [name] MRN: [id number] Date of Birth: [birth year]   Medicare Important Message Given:  Yes(given to RN to deliver to patient due to contact precautions)     [name] 12/09/2018, 2:18 PM

## 2018-12-09 NOTE — Progress Notes (Signed)
PROGRESS NOTE  Eileen [name] [id number] DOB: 1945/08/25 DOA: 12/05/2018 PCP: Lavella Lemons, PA  Brief History: 73 year old female with a history of COPD, chronic respiratory failure on 3.5 L, right-sided breast cancer in remission, atrial flutter on rivaroxaban, diabetes mellitus type 2, hypertension, dementia with behavior disturbance, chronic L1 fracture status post kyphoplasty presenting with hematochezia for approximately 1 week. There have been different accounts regarding the duration of the patient's hematochezia, but the patient confirms that it has been approximately 1 week with intermittent hematochezia. She denied any fevers, chills, abdominal pain.Patient denies any NSAIDs or new over-the-counter medications. She denies any headache, neck pain, chest pain, nausea, vomiting, diarrhea. She has chronic shortness of breath which she states is actually low but better than usual. She has a nonproductive cough. She denies any hemoptysis. In the emergency department, the patient was afebrile hemodynamically stable saturating 99% on 3.5 L. WBC was 9.6 with hemoglobin 10.7. BMP was essentially unremarkable. LFTs and lipase were unremarkable. CT of the abdomen and pelvis showed diverticulosis with inspissated secretions and numerous diverticula. There was no bowel wall thickening or obstruction. GI was consulted to assist with management.During hospitalization, pt had intermittent episodes of afib with HR up to 140. Soft BP limited titration of metoprolol. Pt had hypokalemia which may contributed to her afib which is being repleted  Assessment/Plan: Hematochezia -Suspect diverticular bleed -no further bleeding -diet advanced -GI consultappreciated -03/24/2017 colonoscopy--sigmoid diverticulosis, internal and external hemorrhoids -Baseline hemoglobin~10 -Presented with hemoglobin 10.7 -diet advanced to cardiac diet -Holding rivaroxaban--restart on 12/12/18 per  GI  Atrial flutter -Holding rivaroxaban-restart on 12/12/18 per GI -pt is having intermittent episodes of RVR with HR 130-140-->improving with titration metoprolol back up to home dose -metoprolol dose was decreased initially due to soft BPs -try to titrate back up to home dose -she did not receive metoprolol dose 9/27 pm or 9/28 am-->HR increased -keep K >/= 4 and mag >/=2 -replete K -personally reviewed tele--no afib last 24 hours  Chronic respiratory failure with hypoxia -Stable on 3.5 L -Start bronchodilators  COPD Exacerbation -9/27--pt more dyspneic with wheeze -CXR--vascular congestion without pulmonary edema -EKG--personally reviewed--sinus, nonspecific STT changes -continue bronchodilators -continue pulmicort -start IV solumedrol -lasix IV x 1, then restart po lasix  Diabetes mellitus type 2, uncontrolled with hyperglycemia -09/29/2018 hemoglobin A1c 8.5 -NovoLog sliding scale -Holding metformin  Dementia with behavioral disturbance -Restarted Seroquel at bedtime  Essential hypertension -try to titrate back up to home dose       Disposition Plan: Home 9/29 if HR better Family Communication:NoFamily at bedside  Consultants:GI-Rehman  Code Status: FULL   DVT Prophylaxis: SCDs   Procedures: As Listed in Progress Note Above  Antibiotics: None      Subjective: Pt breathing better than yesterday.  Denies f/c, cp, n/v/d, abd pain.    Objective: Vitals:   12/09/18 0604 12/09/18 0747 12/09/18 0822 12/09/18 1431  BP: (!) 96/51  101/60   Pulse:   79   Resp: 18     Temp: 98 F (36.7 C)     TempSrc: Oral     SpO2:  93%  92%  Weight:      Height:        Intake/Output Summary (Last 24 hours) at 12/09/2018 1553 Last data filed at 12/09/2018 0900 Gross per 24 hour  Intake 480 ml  Output --  Net 480 ml   Weight change:  Exam:   General:  Pt is alert,  follows commands appropriately, not in acute distress  HEENT:  No icterus, No thrush, No neck mass, [place]/AT  Cardiovascular: RRR, S1/S2, no rubs, no gallops  Respiratory: scattered rales. No wheeze  Abdomen: Soft/+BS, non tender, non distended, no guarding  Extremities: No edema, No lymphangitis, No petechiae, No rashes, no synovitis   Data Reviewed: I have personally reviewed following labs and imaging studies Basic Metabolic Panel: Recent Labs  Lab 12/05/18 1515 12/06/18 0700 12/07/18 0701 12/08/18 0626  NA 140 141 144 145  K 5.1 4.2 3.4* 4.2  CL 89* 95* 97* 100  CO2 37* 38* 37* 37*  GLUCOSE 119* 168* 111* 129*  BUN 10 11 12 14   CREATININE 0.69 0.55 0.51 0.60  CALCIUM 9.9 8.9 9.0 9.1  MG  --   --   --  1.7   Liver Function Tests: Recent Labs  Lab 12/05/18 1515 12/06/18 0700  AST 26 21  ALT 27 21  ALKPHOS 110 83  BILITOT 0.7 0.5  PROT 6.7 5.4*  ALBUMIN 3.5 2.8*   Recent Labs  Lab 12/05/18 1515  LIPASE 30   No results for input(s): AMMONIA in the last 168 hours. Coagulation Profile: Recent Labs  Lab 12/05/18 1515  INR 1.5*   CBC: Recent Labs  Lab 12/05/18 1515 12/06/18 0700 12/07/18 0701 12/08/18 0626  WBC 9.6 7.2 7.0 7.2  NEUTROABS 6.2  --   --   --   HGB 10.7* 9.0* 8.9* 9.4*  HCT 38.4 33.0* 31.9* 34.1*  MCV 98.0 98.8 97.9 99.7  PLT 194 159 169 180   Cardiac Enzymes: No results for input(s): CKTOTAL, CKMB, CKMBINDEX, TROPONINI in the last 168 hours. BNP: Invalid input(s): POCBNP CBG: Recent Labs  Lab 12/08/18 1102 12/08/18 1616 12/08/18 2100 12/09/18 0027 12/09/18 0411  GLUCAP 212* 135* 163* 191* 165*   HbA1C: No results for input(s): HGBA1C in the last 72 hours. Urine analysis:    Component Value Date/Time   COLORURINE YELLOW 12/05/2018 1912   APPEARANCEUR CLEAR 12/05/2018 1912   LABSPEC >1.046 (H) 12/05/2018 1912   PHURINE 6.0 12/05/2018 1912   GLUCOSEU NEGATIVE 12/05/2018 1912   HGBUR LARGE (A) 12/05/2018 1912   BILIRUBINUR NEGATIVE 12/05/2018 [place] NEGATIVE 12/05/2018  1912   PROTEINUR NEGATIVE 12/05/2018 1912   UROBILINOGEN 0.2 09/09/2009 1010   NITRITE NEGATIVE 12/05/2018 1912   LEUKOCYTESUR NEGATIVE 12/05/2018 1912   Sepsis Labs: @LABRCNTIP (procalcitonin:4,lacticidven:4) ) Recent Results (from the past 240 hour(s))  Urine culture     Status: Abnormal   Collection Time: 12/05/18  7:12 PM   Specimen: Urine, Clean Catch  Result Value Ref Range Status   Specimen Description   Final    URINE, CLEAN CATCH Performed at [facility], [address] [place], [place] [address]    Special Requests   Final    NONE Performed at [facility], [address], [place], [place] [address]    Culture (A)  Final    >=100,000 COLONIES/mL ESCHERICHIA COLI Confirmed Extended Spectrum Beta-Lactamase Producer (ESBL).  In bloodstream infections from ESBL organisms, carbapenems are preferred over piperacillin/tazobactam. They are shown to have a lower risk of mortality.    Report Status 12/08/2018 FINAL  Final   Organism ID, Bacteria ESCHERICHIA COLI (A)  Final      Susceptibility   Escherichia coli - MIC*    AMPICILLIN >=32 RESISTANT Resistant     CEFAZOLIN >=64 RESISTANT Resistant     CEFTRIAXONE >=64 RESISTANT Resistant     CIPROFLOXACIN >=4 RESISTANT  Resistant     GENTAMICIN <=1 SENSITIVE Sensitive     IMIPENEM <=0.25 SENSITIVE Sensitive     NITROFURANTOIN <=16 SENSITIVE Sensitive     TRIMETH/SULFA <=20 SENSITIVE Sensitive     AMPICILLIN/SULBACTAM 4 SENSITIVE Sensitive     PIP/TAZO <=4 SENSITIVE Sensitive     Extended ESBL POSITIVE Resistant     * >=100,000 COLONIES/mL ESCHERICHIA COLI  SARS Coronavirus 2 [facility] order, Performed in [facility] hospital lab) Nasopharyngeal Nasopharyngeal Swab     Status: None   Collection Time: 12/05/18  7:14 PM   Specimen: Nasopharyngeal Swab  Result Value Ref Range Status   SARS Coronavirus 2 NEGATIVE NEGATIVE Final    Comment: (NOTE) If result is NEGATIVE SARS-CoV-2 target nucleic acids are NOT DETECTED. The  SARS-CoV-2 RNA is generally detectable in upper and lower  respiratory specimens during the acute phase of infection. The lowest  concentration of SARS-CoV-2 viral copies this assay can detect is 250  copies / mL. A negative result does not preclude SARS-CoV-2 infection  and should not be used as the sole basis for treatment or other  patient management decisions.  A negative result may occur with  improper specimen collection / handling, submission of specimen other  than nasopharyngeal swab, presence of viral mutation(s) within the  areas targeted by this assay, and inadequate number of viral copies  (<250 copies / mL). A negative result must be combined with clinical  observations, patient history, and epidemiological information. If result is POSITIVE SARS-CoV-2 target nucleic acids are DETECTED. The SARS-CoV-2 RNA is generally detectable in upper and lower  respiratory specimens dur ing the acute phase of infection.  Positive  results are indicative of active infection with SARS-CoV-2.  Clinical  correlation with patient history and other diagnostic information is  necessary to determine patient infection status.  Positive results do  not rule out bacterial infection or co-infection with other viruses. If result is PRESUMPTIVE POSTIVE SARS-CoV-2 nucleic acids MAY BE PRESENT.   A presumptive positive result was obtained on the submitted specimen  and confirmed on repeat testing.  While 2019 novel coronavirus  (SARS-CoV-2) nucleic acids may be present in the submitted sample  additional confirmatory testing may be necessary for epidemiological  and / or clinical management purposes  to differentiate between  SARS-CoV-2 and other Sarbecovirus currently known to infect humans.  If clinically indicated additional testing with an alternate test  methodology [phone]) is advised. The SARS-CoV-2 RNA is generally  detectable in upper and lower respiratory sp ecimens during the acute    phase of infection. The expected result is Negative. Fact Sheet for Patients:  StrictlyIdeas.no Fact Sheet for Healthcare Providers: BankingDealers.co.za This test is not yet approved or cleared by the [place] FDA and has been authorized for detection and/or diagnosis of SARS-CoV-2 by FDA under an Emergency Use Authorization (EUA).  This EUA will remain in effect (meaning this test can be used) for the duration of the COVID-19 declaration under Section 564(b)(1) of the Act, 21 U.S.C. section 360bbb-3(b)(1), unless the authorization is terminated or revoked sooner. Performed at [facility], [address], [place], [place] [address]      Scheduled Meds:  budesonide (PULMICORT) nebulizer solution  0.5 mg Nebulization BID   diltiazem  30 mg Oral Q6H   furosemide  40 mg Oral Daily   ipratropium-albuterol  3 mL Nebulization Q8H   methylPREDNISolone (SOLU-MEDROL) injection  60 mg Intravenous Q8H   QUEtiapine  25 mg Oral q1800   Continuous  Infusions:  Procedures/Studies: Dg Chest 2 View  Result Date: 12/05/2018 CLINICAL DATA:  Bright red blood in the stool and rectal pain beginning today. History of hemorrhoids. EXAM: CHEST - 2 VIEW COMPARISON:  11/19/2018 FINDINGS: Cardiac silhouette is mildly enlarged. No mediastinal or hilar masses. No evidence of adenopathy. Chronic prominence of the bronchovascular markings. Additional linear lung base opacities consistent with scarring or atelectasis. No evidence of pneumonia or pulmonary edema. No pleural effusion or pneumothorax. Previously treated compression fracture at the thoracolumbar junction. No acute skeletal abnormality. IMPRESSION: No acute cardiopulmonary disease. Electronically Signed   By: Lajean Manes M.D.   On: 12/05/2018 18:02   Ct Abdomen Pelvis W Contrast  Result Date: 12/05/2018 CLINICAL DATA:  73 year old female with a history of GI bleed EXAM: CT ABDOMEN AND PELVIS  WITH CONTRAST TECHNIQUE: Multidetector CT imaging of the abdomen and pelvis was performed using the standard protocol following bolus administration of intravenous contrast. CONTRAST:  126mL OMNIPAQUE IOHEXOL 300 MG/ML  SOLN COMPARISON:  04/21/2018 FINDINGS: Lower chest: Scarring/atelectasis at the lung bases. Hepatobiliary: Unremarkable liver.  Unremarkable gallbladder. Pancreas: Unremarkable Spleen: Unremarkable Adrenals/Urinary Tract: Unremarkable appearance of the bilateral adrenal glands. Right kidney with no hydronephrosis or nephrolithiasis. Bosniak 1 cyst on the medial cortex of the right kidney on image 22. Additional subcentimeter low-density lesion on the anterior cortex of the right kidney on image 27 too small to characterize. Left kidney without hydronephrosis. Nonobstructive nephrolithiasis at the inferior collecting system, with stone measuring approximately 4 mm. Bosniak 1 cyst on the posterior cortex of the left kidney on image 26. Bosniak 1 cyst on the medial anterior cortex of the left kidney on image 28. There are additional low-density lesions which are too small to characterize. Urinary bladder is relatively decompressed. Stomach/Bowel: Small hiatal hernia. Otherwise unremarkable stomach. Small bowel unremarkable. No abnormal distention. No focal wall thickening or inflammatory changes. No transition point. Normal appendix. Mild stool burden. Colonic diverticular disease throughout the colon. Retained contrast or inspissated secretions within numerous diverticula. No focal inflammatory changes are evident. No fluid within the bowel or contrast accumulation. Vascular/Lymphatic: Atherosclerotic changes of the abdominal aorta. No aneurysm or dissection. Mesenteric arteries and renal arteries are patent. Bilateral iliac arteries and proximal femoral arteries are patent. No adenopathy. Reproductive: Focal low-density fluid or tissue within the endometrial canal on image 58. Unremarkable left and  right ovary. Other: Fat containing umbilical hernia with ventral diastasis. Small loop of small bowel at this site without entrapment. Musculoskeletal: Fracture of L1, treated with vertebral augmentation. Degree of posterior retropulsion similar to the comparison. Are no acute displaced fracture. IMPRESSION: No acute CT finding of the abdomen, with no CT evidence to identify source of GI hemorrhage. Colonic diverticula without evidence of acute inflammation. Hiatal hernia. Trace fluid or tissue within the endometrial canal, unexpected in a patient of this age. Referral for gynecologic follow-up is recommended, and potentially evaluation with ultrasound or MRI as early malignancy cannot be excluded. Aortic Atherosclerosis (ICD10-I70.0). Left-sided nonobstructive nephrolithiasis. Electronically Signed   By: Corrie Mckusick D.O.   On: 12/05/2018 18:05   Dg Chest Port 1 View  Result Date: 12/08/2018 CLINICAL DATA:  Dyspnea EXAM: PORTABLE CHEST 1 VIEW COMPARISON:  Radiograph 12/05/2018, CT 09/27/2018 FINDINGS: Lung volumes are diminished with areas of basilar atelectasis. More hazy interstitial opacities present throughout the lower lobes with cephalized, indistinct vascularity and borderline cardiomegaly. More coalescent opacities seen in the right lung base. Region of left basilar scarring blunts the costophrenic sulcus. No visible effusions. No  pneumothorax. No acute osseous or soft tissue abnormality. Degenerative changes are present in the imaged spine and shoulders. IMPRESSION: 1. Findings consistent with congestive heart failure. 2. More coalescent opacities in the right lung base could reflect superimposed alveolar edema or pneumonia. 3. Lung volumes are diminished with bibasilar atelectasis. Electronically Signed   By: Lovena Le M.D.   On: 12/08/2018 20:05   Dg Chest Port 1 View  Result Date: 11/19/2018 CLINICAL DATA:  Acute respiratory failure with hypoxia. EXAM: PORTABLE CHEST 1 VIEW COMPARISON:   Radiograph of November 17, 2018. FINDINGS: Stable cardiomegaly. No pneumothorax is noted. Increased right basilar atelectasis or infiltrate is noted. Minimal left basilar subsegmental atelectasis is noted. Small pleural effusions may be present. Bony thorax is unremarkable. IMPRESSION: Increased right basilar opacity as described above. Electronically Signed   By: Marijo Conception M.D.   On: 11/19/2018 07:29   Dg Chest Port 1 View  Result Date: 11/17/2018 CLINICAL DATA:  Acute respiratory failure.  History of COPD. EXAM: PORTABLE CHEST 1 VIEW COMPARISON:  Single-view of the chest 11/15/2018 and 11/06/2018. CT chest 09/27/2018. FINDINGS: The lungs are emphysematous. Right basilar opacity seen on the most recent examination has resolved. Minimal atelectasis left lung base noted. Heart size is enlarged. No pneumothorax or pleural fluid. IMPRESSION: Resolved right basilar airspace opacity. No change in left basilar subsegmental atelectasis. Emphysema. Atherosclerosis. Electronically Signed   By: Inge Rise M.D.   On: 11/17/2018 09:06   Dg Chest Portable 1 View  Result Date: 11/16/2018 CLINICAL DATA:  Respiratory distress EXAM: PORTABLE CHEST 1 VIEW COMPARISON:  November 06, 2018 FINDINGS: There is mild cardiomegaly. Streaky airspace opacity seen at the right lung base. The left lung is clear. No acute osseous abnormality. IMPRESSION: Streaky airspace opacity at the right lung base which could be due to atelectasis and/or early infectious etiology. Electronically Signed   By: Prudencio Pair M.D.   On: 11/16/2018 00:07   Dg Swallowing Func-speech Pathology  Result Date: 11/21/2018 Objective Swallowing Evaluation: Type of Study: MBS-Modified Barium Swallow Study  Patient Details Name: Eileen A Hallgren MRN: [id number] Date of Birth: [birth year] Today's Date: 11/21/2018 Time: SLP Start Time (ACUTE ONLY): 1210 -SLP Stop Time (ACUTE ONLY): [id number] SLP Time Calculation (min) (ACUTE ONLY): 35 min Past Medical History: Past  Medical History: Diagnosis Date  Asthma   Atrial flutter ([place])   Cancer ([place])   Right breast  COPD (chronic obstructive pulmonary disease) ([place])   Dementia ([place])   History of breast cancer   right breast  Hypertension   On home O2   Type 2 diabetes mellitus ([place])  Past Surgical History: Past Surgical History: Procedure Laterality Date  BREAST SURGERY  2011  Right breast mastectomy  CARDIOVERSION N/A 06/27/2018  Procedure: CARDIOVERSION;  Surgeon: Arnoldo Lenis, MD;  [facility];  Service: Endoscopy;  Laterality: N/A;  CESAREAN SECTION    COLONOSCOPY N/A 01/22/2018  Procedure: COLONOSCOPY;  Surgeon: Rogene Houston, MD;  [facility]: [facility];  Service: Endoscopy;  Laterality: N/A;  HERNIA REPAIR    RIH  IR VERTEBROPLASTY LUMBAR BX INC UNI/BIL INC/INJECT/IMAGING  09/05/2018  MASTECTOMY  2011  right breast  TEE WITHOUT CARDIOVERSION N/A 06/27/2018  Procedure: TRANSESOPHAGEAL ECHOCARDIOGRAM (TEE) WITH PROPOFOL;  Surgeon: Arnoldo Lenis, MD;  [facility];  Service: Endoscopy;  Laterality: N/A; HPI: Eileen A Mickiewicz is a 73 y.o. female with a multiple medical problems as documented below but most importantly COPD and CHF who presents the emergency  department today with respiratory distress.  Patient, on review of records, is here often for respiratory failure with hypoxia and hypercarbia.  Symptoms altered with that as well.  Onset tonight was similar.  EMS got to her and her sats were low they gave her albuterol brought here for further evaluation.  No further history able to be obtained secondary to patient's mental status. BSE requested.  Subjective: Pt alert and cooperative, "doing better" Assessment / Plan / Recommendation CHL IP CLINICAL IMPRESSIONS 11/21/2018 Clinical Impression Pt presents with min oropharyngeal dysphagia characterized by min premature spillage of liquids to the valleculae and spilling to the pyriforms with occasional flash penetration without  aspiration except for one episode of overt aspiration before the swallow when taking straw sips of thin liquids to due premature spillage and suspected breath inhalation. Aspiration was silent and was not removed despite SLP prompts for coughing. Aspiration of thins with a straw was mitigated through use of chin tuck and small sips. Recommend regular textures and thin liquids, NO STRAWS, swallow 2x for each swallow and clear throat periodically. Pt appreciative for recommendations and verbalized that she will take small cup sips of liquids. SLP will follow for diet tolerance x1, however no SLP f/u post acute necessary at this time.  SLP Visit Diagnosis Dysphagia, oropharyngeal phase (R13.12) Attention and concentration deficit following -- Frontal lobe and executive function deficit following -- Impact on safety and function Mild aspiration risk   CHL IP TREATMENT RECOMMENDATION 11/21/2018 Treatment Recommendations Therapy as outlined in treatment plan below   Prognosis 11/21/2018 Prognosis for Safe Diet Advancement Good Barriers to Reach Goals -- Barriers/Prognosis Comment -- CHL IP DIET RECOMMENDATION 11/21/2018 SLP Diet Recommendations Dysphagia 3 (Mech soft) solids;Thin liquid Liquid Administration via Cup;No straw Medication Administration Whole meds with puree Compensations Slow rate;Small sips/bites Postural Changes Remain semi-upright after after feeds/meals (Comment);Seated upright at 90 degrees   CHL IP OTHER RECOMMENDATIONS 11/21/2018 Recommended Consults -- Oral Care Recommendations Oral care BID;Staff/trained caregiver to provide oral care Other Recommendations Clarify dietary restrictions   CHL IP FOLLOW UP RECOMMENDATIONS 11/21/2018 Follow up Recommendations None   CHL IP FREQUENCY AND DURATION 11/21/2018 Speech Therapy Frequency (ACUTE ONLY) min 2x/week Treatment Duration 1 week      CHL IP ORAL PHASE 11/21/2018 Oral Phase Impaired Oral - Pudding Teaspoon -- Oral - Pudding Cup -- Oral - Honey Teaspoon --  Oral - Honey Cup -- Oral - Nectar Teaspoon -- Oral - Nectar Cup -- Oral - Nectar Straw -- Oral - Thin Teaspoon -- Oral - Thin Cup -- Oral - Thin Straw -- Oral - Puree -- Oral - Mech Soft -- Oral - Regular -- Oral - Multi-Consistency -- Oral - Pill -- Oral Phase - Comment --  CHL IP PHARYNGEAL PHASE 11/21/2018 Pharyngeal Phase Impaired Pharyngeal- Pudding Teaspoon -- Pharyngeal -- Pharyngeal- Pudding Cup -- Pharyngeal -- Pharyngeal- Honey Teaspoon -- Pharyngeal -- Pharyngeal- Honey Cup -- Pharyngeal -- Pharyngeal- Nectar Teaspoon -- Pharyngeal -- Pharyngeal- Nectar Cup -- Pharyngeal -- Pharyngeal- Nectar Straw -- Pharyngeal -- Pharyngeal- Thin Teaspoon Delayed swallow initiation-vallecula;Delayed swallow initiation-pyriform sinuses Pharyngeal -- Pharyngeal- Thin Cup Delayed swallow initiation-vallecula;Delayed swallow initiation-pyriform sinuses;Penetration/Aspiration during swallow Pharyngeal Material enters airway, CONTACTS cords and then ejected out;Material does not enter airway Pharyngeal- Thin Straw Delayed swallow initiation-pyriform sinuses;Penetration/Aspiration before swallow;Reduced airway/laryngeal closure;Trace aspiration;Moderate aspiration Pharyngeal Material enters airway, passes BELOW cords without attempt by patient to eject out (silent aspiration) Pharyngeal- Puree Delayed swallow initiation-vallecula Pharyngeal -- Pharyngeal- Mechanical Soft -- Pharyngeal -- Pharyngeal- Regular  Delayed swallow initiation-vallecula Pharyngeal -- Pharyngeal- Multi-consistency -- Pharyngeal -- Pharyngeal- Pill Penetration/Aspiration during swallow Pharyngeal Material enters airway, remains ABOVE vocal cords then ejected out Pharyngeal Comment mild/mod aspiration of straw sips thin liquid before the swallow due to premature spillage and reduced vocal fold closure, silent  CHL IP CERVICAL ESOPHAGEAL PHASE 11/21/2018 Cervical Esophageal Phase WFL Pudding Teaspoon -- Pudding Cup -- Honey Teaspoon -- Honey Cup -- Nectar  Teaspoon -- Nectar Cup -- Nectar Straw -- Thin Teaspoon -- Thin Cup -- Thin Straw -- Puree -- Mechanical Soft -- Regular -- Multi-consistency -- Pill -- Cervical Esophageal Comment -- Thank you, [name], [place] 11/21/2018, 5:59 PM               Orson Eva, DO  [facility] Pager [phone]  If 7PM-7AM, please contact night-coverage [email or web address] Password [email or web address] 12/09/2018, 3:53 PM   LOS: 3 days

## 2018-12-10 DIAGNOSIS — J690 Pneumonitis due to inhalation of food and vomit: Secondary | ICD-10-CM | POA: Diagnosis not present

## 2018-12-10 DIAGNOSIS — J439 Emphysema, unspecified: Secondary | ICD-10-CM | POA: Diagnosis not present

## 2018-12-10 DIAGNOSIS — J9601 Acute respiratory failure with hypoxia: Secondary | ICD-10-CM | POA: Diagnosis not present

## 2018-12-10 DIAGNOSIS — K922 Gastrointestinal hemorrhage, unspecified: Secondary | ICD-10-CM

## 2018-12-10 DIAGNOSIS — J9602 Acute respiratory failure with hypercapnia: Secondary | ICD-10-CM | POA: Diagnosis not present

## 2018-12-10 DIAGNOSIS — J9610 Chronic respiratory failure, unspecified whether with hypoxia or hypercapnia: Secondary | ICD-10-CM | POA: Diagnosis not present

## 2018-12-10 LAB — GLUCOSE, CAPILLARY
Glucose-Capillary: 207 mg/dL — ABNORMAL HIGH (ref 70–99)
Glucose-Capillary: 251 mg/dL — ABNORMAL HIGH (ref 70–99)
Glucose-Capillary: 300 mg/dL — ABNORMAL HIGH (ref 70–99)
Glucose-Capillary: 311 mg/dL — ABNORMAL HIGH (ref 70–99)
Glucose-Capillary: 358 mg/dL — ABNORMAL HIGH (ref 70–99)
Glucose-Capillary: 377 mg/dL — ABNORMAL HIGH (ref 70–99)

## 2018-12-10 LAB — BASIC METABOLIC PANEL
Anion gap: 11 (ref 5–15)
BUN: 18 mg/dL (ref 8–23)
CO2: 37 mmol/L — ABNORMAL HIGH (ref 22–32)
Calcium: 8.8 mg/dL — ABNORMAL LOW (ref 8.9–10.3)
Chloride: 92 mmol/L — ABNORMAL LOW (ref 98–111)
Creatinine, Ser: 0.76 mg/dL (ref 0.44–1.00)
GFR calc Af Amer: >60 mL/min (ref >60)
GFR calc non Af Amer: >60 mL/min (ref >60)
Glucose, Bld: 249 mg/dL — ABNORMAL HIGH (ref 70–99)
Potassium: 4.3 mmol/L (ref 3.5–5.1)
Sodium: 140 mmol/L (ref 135–145)

## 2018-12-10 LAB — CBC
HCT: 30.3 % — ABNORMAL LOW (ref 36.0–46.0)
Hemoglobin: 8.6 g/dL — ABNORMAL LOW (ref 12.0–15.0)
MCH: 27.1 pg (ref 26.0–34.0)
MCHC: 28.4 g/dL — ABNORMAL LOW (ref 30.0–36.0)
MCV: 95.6 fL (ref 80.0–100.0)
Platelets: 281 10*3/uL (ref 150–400)
RBC: 3.17 MIL/uL — ABNORMAL LOW (ref 3.87–5.11)
RDW: 15.4 % (ref 11.5–15.5)
WBC: 8.5 10*3/uL (ref 4.0–10.5)
nRBC: 0 % (ref 0.0–0.2)

## 2018-12-10 MED ORDER — DILTIAZEM HCL ER COATED BEADS 180 MG PO CP24
180.0000 mg | ORAL_CAPSULE | Freq: Every day | ORAL | Status: DC
  Administered 2018-12-10 – 2018-12-11 (×2): 180 mg via ORAL
  Filled 2018-12-10 (×2): qty 1

## 2018-12-10 MED ORDER — FUROSEMIDE 20 MG PO TABS
20.0000 mg | ORAL_TABLET | Freq: Every day | ORAL | Status: DC
  Administered 2018-12-10 – 2018-12-11 (×2): 20 mg via ORAL
  Filled 2018-12-10: qty 1

## 2018-12-10 MED ORDER — METOPROLOL TARTRATE 25 MG PO TABS
12.5000 mg | ORAL_TABLET | Freq: Two times a day (BID) | ORAL | Status: DC
  Administered 2018-12-10 – 2018-12-11 (×2): 12.5 mg via ORAL
  Filled 2018-12-10 (×2): qty 1

## 2018-12-10 MED ORDER — INSULIN ASPART 100 UNIT/ML ~~LOC~~ SOLN
0.0000 [IU] | Freq: Three times a day (TID) | SUBCUTANEOUS | Status: DC
  Administered 2018-12-11: 09:00:00 5 [IU] via SUBCUTANEOUS

## 2018-12-10 MED ORDER — INSULIN ASPART 100 UNIT/ML ~~LOC~~ SOLN
0.0000 [IU] | Freq: Every day | SUBCUTANEOUS | Status: DC
  Administered 2018-12-10: 4 [IU] via SUBCUTANEOUS

## 2018-12-10 NOTE — Progress Notes (Signed)
Inpatient Diabetes Program Recommendations  AACE/ADA: New Consensus Statement on Inpatient Glycemic Control (2015)  Target Ranges:  Prepandial:   less than 140 mg/dL      Peak postprandial:   less than 180 mg/dL (1-2 hours)      Critically ill patients:  140 - 180 mg/dL   Lab Results  Component Value Date   GLUCAP 207 (H) 12/10/2018   HGBA1C 8.5 (H) 09/29/2018    Review of Glycemic Control Results for [place], [name] (MRN [id number]) as of 12/10/2018 09:49  Ref. Range 12/09/2018 04:11 12/09/2018 16:14 12/09/2018 21:10 12/09/2018 23:57 12/10/2018 04:18 12/10/2018 07:34  Glucose-Capillary Latest Ref Range: 70 - 99 mg/dL 165 (H) 250 (H) 318 (H) 300 (H) 251 (H) 207 (H)   Diabetes history: DM 2 Outpatient Diabetes medications: Metformin 500 mg Daily Current orders for Inpatient glycemic control: None  A1c 8.5% on 09/29/18  Inpatient Diabetes Program Recommendations:    Solumedrol 60 mg Q8 hours. Consider Novolog 0-15 units tid + hs scale.   Add carbohydrate modified to diet.  Thanks,  Tama Headings RN, MSN, BC-ADM Inpatient Diabetes Coordinator Team Pager [phone] (8a-5p)

## 2018-12-10 NOTE — Progress Notes (Signed)
PROGRESS NOTE  Eileen [name] [id number] DOB: 26-Dec-1945 DOA: 12/05/2018 PCP: Lavella Lemons, PA  Brief History: 73 year old female with a history of COPD, chronic respiratory failure on 3.5 L, right-sided breast cancer in remission, atrial flutter on rivaroxaban, diabetes mellitus type 2, hypertension, dementia with behavior disturbance, chronic L1 fracture status post kyphoplasty presenting with hematochezia for approximately 1 week. There have been different accounts regarding the duration of the patient's hematochezia, but the patient confirms that it has been approximately 1 week with intermittent hematochezia. She denied any fevers, chills, abdominal pain.Patient denies any NSAIDs or new over-the-counter medications. She denies any headache, neck pain, chest pain, nausea, vomiting, diarrhea. She has chronic shortness of breath which she states is actually low but better than usual. She has a nonproductive cough. She denies any hemoptysis. In the emergency department, the patient was afebrile hemodynamically stable saturating 99% on 3.5 L. WBC was 9.6 with hemoglobin 10.7. BMP was essentially unremarkable. LFTs and lipase were unremarkable. CT of the abdomen and pelvis showed diverticulosis with inspissated secretions and numerous diverticula. There was no bowel wall thickening or obstruction. GI was consulted to assist with management.During hospitalization, pt had intermittent episodes of atrial tachycardia/MAT with HR up to 140. Due to more sustained episodes, pt was started on diltiazem and metoprolol was titrated down to allow BP margin for titration.Pt had hypokalemia which may contributed to her afib which is being repleted  Assessment/Plan: Hematochezia -Suspect diverticular bleed -no further bleeding -diet advanced which pt is tolerating -GI consultappreciated -03/24/2017 colonoscopy--sigmoid diverticulosis, internal and external hemorrhoids -Baseline  hemoglobin~10 -Presented with hemoglobin 10.7 -Holding rivaroxaban--restart on 12/12/18 per GI -no bloody BMs x 48 hours  Atrial flutter/Atrial tachycardia -Holding rivaroxaban-restart on 12/12/18 per GI -pt is having intermittent episodes of RVR with HR 130-140-->improving with titration metoprolol back up to home dose -metoprolol dose was decreased initially due to soft BPs -try to titrate back up to home dose -she did not receive metoprolol dose 9/27 pm or 9/28 am-->HR increased -keep K >/= 4 and mag >/=2 -replete K -due to more sustained episodes, pt started on diltiazem -most recent BP 122/66 at 1715 on 9/29  Chronic respiratory failure with hypoxia -Stable on 3.5 L -Start bronchodilators  COPDExacerbation -9/27--pt more dyspneic with wheeze -CXR--vascular congestion without pulmonary edema -EKG--personally reviewed--sinus, nonspecific STT changes -continuebronchodilators -continuepulmicort -start IV solumedrol>>>prednisone taper -lasix IV x 1, then restart po lasix  Diabetes mellitus type 2, uncontrolled with hyperglycemia -09/29/2018 hemoglobin A1c 8.5 -NovoLog sliding scale -Holding metformin  Dementia with behavioral disturbance -RestartedSeroquel at bedtime  Essential hypertension -continue diltiazem -restart lower dose metoprolol       Disposition Plan: Home 9/30 if HR better controlled Family Communication:NoFamily at bedside  Consultants:GI-Rehman  Code Status: FULL   DVT Prophylaxis: SCDs   Procedures: As Listed in Progress Note Above  Antibiotics: None     Subjective: Patient denies fevers, chills, headache, chest pain, dyspnea, nausea, vomiting, diarrhea, abdominal pain, dysuria, hematuria, hematochezia, and melena.   Objective: Vitals:   12/10/18 0557 12/10/18 0759 12/10/18 0948 12/10/18 1435  BP: (!) 108/55  (!) 102/53   Pulse: 86     Resp: 18     Temp: 98 F (36.7 C)     TempSrc: Oral       SpO2: 98% 95%  97%  Weight: 67.1 kg     Height:       No intake or output data in the 24  hours ending 12/10/18 1726 Weight change: 0.3 kg Exam:   General:  Pt is alert, follows commands appropriately, not in acute distress  HEENT: No icterus, No thrush, No neck mass, [place]/AT  Cardiovascular: RRR, S1/S2, no rubs, no gallops  Respiratory: bibasilar rales, no wheeze  Abdomen: Soft/+BS, non tender, non distended, no guarding  Extremities: trace LE edema, No lymphangitis, No petechiae, No rashes, no synovitis   Data Reviewed: I have personally reviewed following labs and imaging studies Basic Metabolic Panel: Recent Labs  Lab 12/05/18 1515 12/06/18 0700 12/07/18 0701 12/08/18 0626 12/10/18 0623  NA 140 141 144 145 140  K 5.1 4.2 3.4* 4.2 4.3  CL 89* 95* 97* 100 92*  CO2 37* 38* 37* 37* 37*  GLUCOSE 119* 168* 111* 129* 249*  BUN 10 11 12 14 18   CREATININE 0.69 0.55 0.51 0.60 0.76  CALCIUM 9.9 8.9 9.0 9.1 8.8*  MG  --   --   --  1.7  --    Liver Function Tests: Recent Labs  Lab 12/05/18 1515 12/06/18 0700  AST 26 21  ALT 27 21  ALKPHOS 110 83  BILITOT 0.7 0.5  PROT 6.7 5.4*  ALBUMIN 3.5 2.8*   Recent Labs  Lab 12/05/18 1515  LIPASE 30   No results for input(s): AMMONIA in the last 168 hours. Coagulation Profile: Recent Labs  Lab 12/05/18 1515  INR 1.5*   CBC: Recent Labs  Lab 12/05/18 1515 12/06/18 0700 12/07/18 0701 12/08/18 0626 12/10/18 0623  WBC 9.6 7.2 7.0 7.2 8.5  NEUTROABS 6.2  --   --   --   --   HGB 10.7* 9.0* 8.9* 9.4* 8.6*  HCT 38.4 33.0* 31.9* 34.1* 30.3*  MCV 98.0 98.8 97.9 99.7 95.6  PLT 194 159 169 180 281   Cardiac Enzymes: No results for input(s): CKTOTAL, CKMB, CKMBINDEX, TROPONINI in the last 168 hours. BNP: Invalid input(s): POCBNP CBG: Recent Labs  Lab 12/09/18 2110 12/09/18 2357 12/10/18 0418 12/10/18 0734 12/10/18 1122  GLUCAP 318* 300* 251* 207* 358*   HbA1C: No results for input(s): HGBA1C in the last 72  hours. Urine analysis:    Component Value Date/Time   COLORURINE YELLOW 12/05/2018 1912   APPEARANCEUR CLEAR 12/05/2018 1912   LABSPEC >1.046 (H) 12/05/2018 1912   PHURINE 6.0 12/05/2018 1912   GLUCOSEU NEGATIVE 12/05/2018 1912   HGBUR LARGE (A) 12/05/2018 1912   BILIRUBINUR NEGATIVE 12/05/2018 [place] NEGATIVE 12/05/2018 1912   PROTEINUR NEGATIVE 12/05/2018 1912   UROBILINOGEN 0.2 09/09/2009 1010   NITRITE NEGATIVE 12/05/2018 1912   LEUKOCYTESUR NEGATIVE 12/05/2018 1912   Sepsis Labs: @LABRCNTIP (procalcitonin:4,lacticidven:4) ) Recent Results (from the past 240 hour(s))  Urine culture     Status: Abnormal   Collection Time: 12/05/18  7:12 PM   Specimen: Urine, Clean Catch  Result Value Ref Range Status   Specimen Description   Final    URINE, CLEAN CATCH Performed at [facility], [address], [place], [place] [address]    Special Requests   Final    NONE Performed at [facility], [address] [place], [place] [address]    Culture (A)  Final    >=100,000 COLONIES/mL ESCHERICHIA COLI Confirmed Extended Spectrum Beta-Lactamase Producer (ESBL).  In bloodstream infections from ESBL organisms, carbapenems are preferred over piperacillin/tazobactam. They are shown to have a lower risk of mortality.    Report Status 12/08/2018 FINAL  Final   Organism ID, Bacteria ESCHERICHIA COLI (A)  Final  Susceptibility   Escherichia coli - MIC*    AMPICILLIN >=32 RESISTANT Resistant     CEFAZOLIN >=64 RESISTANT Resistant     CEFTRIAXONE >=64 RESISTANT Resistant     CIPROFLOXACIN >=4 RESISTANT Resistant     GENTAMICIN <=1 SENSITIVE Sensitive     IMIPENEM <=0.25 SENSITIVE Sensitive     NITROFURANTOIN <=16 SENSITIVE Sensitive     TRIMETH/SULFA <=20 SENSITIVE Sensitive     AMPICILLIN/SULBACTAM 4 SENSITIVE Sensitive     PIP/TAZO <=4 SENSITIVE Sensitive     Extended ESBL POSITIVE Resistant     * >=100,000 COLONIES/mL ESCHERICHIA COLI  SARS Coronavirus 2 [facility] order,  Performed in [facility] hospital lab) Nasopharyngeal Nasopharyngeal Swab     Status: None   Collection Time: 12/05/18  7:14 PM   Specimen: Nasopharyngeal Swab  Result Value Ref Range Status   SARS Coronavirus 2 NEGATIVE NEGATIVE Final    Comment: (NOTE) If result is NEGATIVE SARS-CoV-2 target nucleic acids are NOT DETECTED. The SARS-CoV-2 RNA is generally detectable in upper and lower  respiratory specimens during the acute phase of infection. The lowest  concentration of SARS-CoV-2 viral copies this assay can detect is 250  copies / mL. A negative result does not preclude SARS-CoV-2 infection  and should not be used as the sole basis for treatment or other  patient management decisions.  A negative result may occur with  improper specimen collection / handling, submission of specimen other  than nasopharyngeal swab, presence of viral mutation(s) within the  areas targeted by this assay, and inadequate number of viral copies  (<250 copies / mL). A negative result must be combined with clinical  observations, patient history, and epidemiological information. If result is POSITIVE SARS-CoV-2 target nucleic acids are DETECTED. The SARS-CoV-2 RNA is generally detectable in upper and lower  respiratory specimens dur ing the acute phase of infection.  Positive  results are indicative of active infection with SARS-CoV-2.  Clinical  correlation with patient history and other diagnostic information is  necessary to determine patient infection status.  Positive results do  not rule out bacterial infection or co-infection with other viruses. If result is PRESUMPTIVE POSTIVE SARS-CoV-2 nucleic acids MAY BE PRESENT.   A presumptive positive result was obtained on the submitted specimen  and confirmed on repeat testing.  While 2019 novel coronavirus  (SARS-CoV-2) nucleic acids may be present in the submitted sample  additional confirmatory testing may be necessary for epidemiological  and / or  clinical management purposes  to differentiate between  SARS-CoV-2 and other Sarbecovirus currently known to infect humans.  If clinically indicated additional testing with an alternate test  methodology [phone]) is advised. The SARS-CoV-2 RNA is generally  detectable in upper and lower respiratory sp ecimens during the acute  phase of infection. The expected result is Negative. Fact Sheet for Patients:  StrictlyIdeas.no Fact Sheet for Healthcare Providers: BankingDealers.co.za This test is not yet approved or cleared by the [place] FDA and has been authorized for detection and/or diagnosis of SARS-CoV-2 by FDA under an Emergency Use Authorization (EUA).  This EUA will remain in effect (meaning this test can be used) for the duration of the COVID-19 declaration under Section 564(b)(1) of the Act, 21 U.S.C. section 360bbb-3(b)(1), unless the authorization is terminated or revoked sooner. Performed at [facility], [address] [place], [place] [address]      Scheduled Meds:  budesonide (PULMICORT) nebulizer solution  0.5 mg Nebulization BID   diltiazem  180 mg Oral Daily  furosemide  20 mg Oral Daily   insulin aspart  0-5 Units Subcutaneous QHS   [START ON 12/11/2018] insulin aspart  0-9 Units Subcutaneous TID WC   ipratropium-albuterol  3 mL Nebulization Q8H   methylPREDNISolone (SOLU-MEDROL) injection  60 mg Intravenous Q8H   metoprolol tartrate  12.5 mg Oral BID   QUEtiapine  25 mg Oral q1800   Continuous Infusions:  Procedures/Studies: Dg Chest 2 View  Result Date: 12/05/2018 CLINICAL DATA:  Bright red blood in the stool and rectal pain beginning today. History of hemorrhoids. EXAM: CHEST - 2 VIEW COMPARISON:  11/19/2018 FINDINGS: Cardiac silhouette is mildly enlarged. No mediastinal or hilar masses. No evidence of adenopathy. Chronic prominence of the bronchovascular markings. Additional linear lung base  opacities consistent with scarring or atelectasis. No evidence of pneumonia or pulmonary edema. No pleural effusion or pneumothorax. Previously treated compression fracture at the thoracolumbar junction. No acute skeletal abnormality. IMPRESSION: No acute cardiopulmonary disease. Electronically Signed   By: Lajean Manes M.D.   On: 12/05/2018 18:02   Ct Abdomen Pelvis W Contrast  Result Date: 12/05/2018 CLINICAL DATA:  73 year old female with a history of GI bleed EXAM: CT ABDOMEN AND PELVIS WITH CONTRAST TECHNIQUE: Multidetector CT imaging of the abdomen and pelvis was performed using the standard protocol following bolus administration of intravenous contrast. CONTRAST:  161mL OMNIPAQUE IOHEXOL 300 MG/ML  SOLN COMPARISON:  04/21/2018 FINDINGS: Lower chest: Scarring/atelectasis at the lung bases. Hepatobiliary: Unremarkable liver.  Unremarkable gallbladder. Pancreas: Unremarkable Spleen: Unremarkable Adrenals/Urinary Tract: Unremarkable appearance of the bilateral adrenal glands. Right kidney with no hydronephrosis or nephrolithiasis. Bosniak 1 cyst on the medial cortex of the right kidney on image 22. Additional subcentimeter low-density lesion on the anterior cortex of the right kidney on image 27 too small to characterize. Left kidney without hydronephrosis. Nonobstructive nephrolithiasis at the inferior collecting system, with stone measuring approximately 4 mm. Bosniak 1 cyst on the posterior cortex of the left kidney on image 26. Bosniak 1 cyst on the medial anterior cortex of the left kidney on image 28. There are additional low-density lesions which are too small to characterize. Urinary bladder is relatively decompressed. Stomach/Bowel: Small hiatal hernia. Otherwise unremarkable stomach. Small bowel unremarkable. No abnormal distention. No focal wall thickening or inflammatory changes. No transition point. Normal appendix. Mild stool burden. Colonic diverticular disease throughout the colon. Retained  contrast or inspissated secretions within numerous diverticula. No focal inflammatory changes are evident. No fluid within the bowel or contrast accumulation. Vascular/Lymphatic: Atherosclerotic changes of the abdominal aorta. No aneurysm or dissection. Mesenteric arteries and renal arteries are patent. Bilateral iliac arteries and proximal femoral arteries are patent. No adenopathy. Reproductive: Focal low-density fluid or tissue within the endometrial canal on image 58. Unremarkable left and right ovary. Other: Fat containing umbilical hernia with ventral diastasis. Small loop of small bowel at this site without entrapment. Musculoskeletal: Fracture of L1, treated with vertebral augmentation. Degree of posterior retropulsion similar to the comparison. Are no acute displaced fracture. IMPRESSION: No acute CT finding of the abdomen, with no CT evidence to identify source of GI hemorrhage. Colonic diverticula without evidence of acute inflammation. Hiatal hernia. Trace fluid or tissue within the endometrial canal, unexpected in a patient of this age. Referral for gynecologic follow-up is recommended, and potentially evaluation with ultrasound or MRI as early malignancy cannot be excluded. Aortic Atherosclerosis (ICD10-I70.0). Left-sided nonobstructive nephrolithiasis. Electronically Signed   By: Corrie Mckusick D.O.   On: 12/05/2018 18:05   Dg Chest [facility]  Result Date: 12/08/2018 CLINICAL DATA:  Dyspnea EXAM: PORTABLE CHEST 1 VIEW COMPARISON:  Radiograph 12/05/2018, CT 09/27/2018 FINDINGS: Lung volumes are diminished with areas of basilar atelectasis. More hazy interstitial opacities present throughout the lower lobes with cephalized, indistinct vascularity and borderline cardiomegaly. More coalescent opacities seen in the right lung base. Region of left basilar scarring blunts the costophrenic sulcus. No visible effusions. No pneumothorax. No acute osseous or soft tissue abnormality. Degenerative changes are  present in the imaged spine and shoulders. IMPRESSION: 1. Findings consistent with congestive heart failure. 2. More coalescent opacities in the right lung base could reflect superimposed alveolar edema or pneumonia. 3. Lung volumes are diminished with bibasilar atelectasis. Electronically Signed   By: Lovena Le M.D.   On: 12/08/2018 20:05   Dg Chest Port 1 View  Result Date: 11/19/2018 CLINICAL DATA:  Acute respiratory failure with hypoxia. EXAM: PORTABLE CHEST 1 VIEW COMPARISON:  Radiograph of November 17, 2018. FINDINGS: Stable cardiomegaly. No pneumothorax is noted. Increased right basilar atelectasis or infiltrate is noted. Minimal left basilar subsegmental atelectasis is noted. Small pleural effusions may be present. Bony thorax is unremarkable. IMPRESSION: Increased right basilar opacity as described above. Electronically Signed   By: Marijo Conception M.D.   On: 11/19/2018 07:29   Dg Chest Port 1 View  Result Date: 11/17/2018 CLINICAL DATA:  Acute respiratory failure.  History of COPD. EXAM: PORTABLE CHEST 1 VIEW COMPARISON:  Single-view of the chest 11/15/2018 and 11/06/2018. CT chest 09/27/2018. FINDINGS: The lungs are emphysematous. Right basilar opacity seen on the most recent examination has resolved. Minimal atelectasis left lung base noted. Heart size is enlarged. No pneumothorax or pleural fluid. IMPRESSION: Resolved right basilar airspace opacity. No change in left basilar subsegmental atelectasis. Emphysema. Atherosclerosis. Electronically Signed   By: Inge Rise M.D.   On: 11/17/2018 09:06   Dg Chest Portable 1 View  Result Date: 11/16/2018 CLINICAL DATA:  Respiratory distress EXAM: PORTABLE CHEST 1 VIEW COMPARISON:  November 06, 2018 FINDINGS: There is mild cardiomegaly. Streaky airspace opacity seen at the right lung base. The left lung is clear. No acute osseous abnormality. IMPRESSION: Streaky airspace opacity at the right lung base which could be due to atelectasis and/or early  infectious etiology. Electronically Signed   By: Prudencio Pair M.D.   On: 11/16/2018 00:07   Dg Swallowing Func-speech Pathology  Result Date: 11/21/2018 Objective Swallowing Evaluation: Type of Study: MBS-Modified Barium Swallow Study  Patient Details Name: Eileen A Ehinger MRN: [id number] Date of Birth: 1945-11-22 Today's Date: 11/21/2018 Time: SLP Start Time (ACUTE ONLY): 1210 -SLP Stop Time (ACUTE ONLY): [id number] SLP Time Calculation (min) (ACUTE ONLY): 35 min Past Medical History: Past Medical History: Diagnosis Date  Asthma   Atrial flutter ([place])   Cancer ([place])   Right breast  COPD (chronic obstructive pulmonary disease) ([place])   Dementia ([place])   History of breast cancer   right breast  Hypertension   On home O2   Type 2 diabetes mellitus ([place])  Past Surgical History: Past Surgical History: Procedure Laterality Date  BREAST SURGERY  2011  Right breast mastectomy  CARDIOVERSION N/A 06/27/2018  Procedure: CARDIOVERSION;  Surgeon: Arnoldo Lenis, MD;  [facility];  Service: Endoscopy;  Laterality: N/A;  CESAREAN SECTION    COLONOSCOPY N/A 01/22/2018  Procedure: COLONOSCOPY;  Surgeon: Rogene Houston, MD;  [facility];  Service: Endoscopy;  Laterality: N/A;  HERNIA REPAIR    RIH  IR VERTEBROPLASTY LUMBAR BX INC UNI/BIL INC/INJECT/IMAGING  09/05/2018  MASTECTOMY  2011  right breast  TEE WITHOUT CARDIOVERSION N/A 06/27/2018  Procedure: TRANSESOPHAGEAL ECHOCARDIOGRAM (TEE) WITH PROPOFOL;  Surgeon: Arnoldo Lenis, MD;  [facility]: [facility];  Service: Endoscopy;  Laterality: N/A; HPI: Eileen A Gucciardo is a 73 y.o. female with a multiple medical problems as documented below but most importantly COPD and CHF who presents the emergency department today with respiratory distress.  Patient, on review of records, is here often for respiratory failure with hypoxia and hypercarbia.  Symptoms altered with that as well.  Onset tonight was similar.  EMS got to her and her sats were low  they gave her albuterol brought here for further evaluation.  No further history able to be obtained secondary to patient's mental status. BSE requested.  Subjective: Pt alert and cooperative, "doing better" Assessment / Plan / Recommendation CHL IP CLINICAL IMPRESSIONS 11/21/2018 Clinical Impression Pt presents with min oropharyngeal dysphagia characterized by min premature spillage of liquids to the valleculae and spilling to the pyriforms with occasional flash penetration without aspiration except for one episode of overt aspiration before the swallow when taking straw sips of thin liquids to due premature spillage and suspected breath inhalation. Aspiration was silent and was not removed despite SLP prompts for coughing. Aspiration of thins with a straw was mitigated through use of chin tuck and small sips. Recommend regular textures and thin liquids, NO STRAWS, swallow 2x for each swallow and clear throat periodically. Pt appreciative for recommendations and verbalized that she will take small cup sips of liquids. SLP will follow for diet tolerance x1, however no SLP f/u post acute necessary at this time.  SLP Visit Diagnosis Dysphagia, oropharyngeal phase (R13.12) Attention and concentration deficit following -- Frontal lobe and executive function deficit following -- Impact on safety and function Mild aspiration risk   CHL IP TREATMENT RECOMMENDATION 11/21/2018 Treatment Recommendations Therapy as outlined in treatment plan below   Prognosis 11/21/2018 Prognosis for Safe Diet Advancement Good Barriers to Reach Goals -- Barriers/Prognosis Comment -- CHL IP DIET RECOMMENDATION 11/21/2018 SLP Diet Recommendations Dysphagia 3 (Mech soft) solids;Thin liquid Liquid Administration via Cup;No straw Medication Administration Whole meds with puree Compensations Slow rate;Small sips/bites Postural Changes Remain semi-upright after after feeds/meals (Comment);Seated upright at 90 degrees   CHL IP OTHER RECOMMENDATIONS  11/21/2018 Recommended Consults -- Oral Care Recommendations Oral care BID;Staff/trained caregiver to provide oral care Other Recommendations Clarify dietary restrictions   CHL IP FOLLOW UP RECOMMENDATIONS 11/21/2018 Follow up Recommendations None   CHL IP FREQUENCY AND DURATION 11/21/2018 Speech Therapy Frequency (ACUTE ONLY) min 2x/week Treatment Duration 1 week      CHL IP ORAL PHASE 11/21/2018 Oral Phase Impaired Oral - Pudding Teaspoon -- Oral - Pudding Cup -- Oral - Honey Teaspoon -- Oral - Honey Cup -- Oral - Nectar Teaspoon -- Oral - Nectar Cup -- Oral - Nectar Straw -- Oral - Thin Teaspoon -- Oral - Thin Cup -- Oral - Thin Straw -- Oral - Puree -- Oral - Mech Soft -- Oral - Regular -- Oral - Multi-Consistency -- Oral - Pill -- Oral Phase - Comment --  CHL IP PHARYNGEAL PHASE 11/21/2018 Pharyngeal Phase Impaired Pharyngeal- Pudding Teaspoon -- Pharyngeal -- Pharyngeal- Pudding Cup -- Pharyngeal -- Pharyngeal- Honey Teaspoon -- Pharyngeal -- Pharyngeal- Honey Cup -- Pharyngeal -- Pharyngeal- Nectar Teaspoon -- Pharyngeal -- Pharyngeal- Nectar Cup -- Pharyngeal -- Pharyngeal- Nectar Straw -- Pharyngeal -- Pharyngeal- Thin Teaspoon Delayed swallow initiation-vallecula;Delayed swallow initiation-pyriform sinuses Pharyngeal -- Pharyngeal- Thin Cup Delayed  swallow initiation-vallecula;Delayed swallow initiation-pyriform sinuses;Penetration/Aspiration during swallow Pharyngeal Material enters airway, CONTACTS cords and then ejected out;Material does not enter airway Pharyngeal- Thin Straw Delayed swallow initiation-pyriform sinuses;Penetration/Aspiration before swallow;Reduced airway/laryngeal closure;Trace aspiration;Moderate aspiration Pharyngeal Material enters airway, passes BELOW cords without attempt by patient to eject out (silent aspiration) Pharyngeal- Puree Delayed swallow initiation-vallecula Pharyngeal -- Pharyngeal- Mechanical Soft -- Pharyngeal -- Pharyngeal- Regular Delayed swallow initiation-vallecula  Pharyngeal -- Pharyngeal- Multi-consistency -- Pharyngeal -- Pharyngeal- Pill Penetration/Aspiration during swallow Pharyngeal Material enters airway, remains ABOVE vocal cords then ejected out Pharyngeal Comment mild/mod aspiration of straw sips thin liquid before the swallow due to premature spillage and reduced vocal fold closure, silent  CHL IP CERVICAL ESOPHAGEAL PHASE 11/21/2018 Cervical Esophageal Phase WFL Pudding Teaspoon -- Pudding Cup -- Honey Teaspoon -- Honey Cup -- Nectar Teaspoon -- Nectar Cup -- Nectar Straw -- Thin Teaspoon -- Thin Cup -- Thin Straw -- Puree -- Mechanical Soft -- Regular -- Multi-consistency -- Pill -- Cervical Esophageal Comment -- Thank you, [name], [place] PORTER,DABNEY 11/21/2018, 5:59 PM               Orson Eva, DO  [facility] Pager [phone]  If 7PM-7AM, please contact night-coverage [email or web address] Password [email or web address] 12/10/2018, 5:26 PM   LOS: 4 days

## 2018-12-11 LAB — CBC
HCT: 27.6 % — ABNORMAL LOW (ref 36.0–46.0)
Hemoglobin: 7.9 g/dL — ABNORMAL LOW (ref 12.0–15.0)
MCH: 27.2 pg (ref 26.0–34.0)
MCHC: 28.6 g/dL — ABNORMAL LOW (ref 30.0–36.0)
MCV: 95.2 fL (ref 80.0–100.0)
Platelets: 329 10*3/uL (ref 150–400)
RBC: 2.9 MIL/uL — ABNORMAL LOW (ref 3.87–5.11)
RDW: 15.3 % (ref 11.5–15.5)
WBC: 10.2 10*3/uL (ref 4.0–10.5)
nRBC: 0 % (ref 0.0–0.2)

## 2018-12-11 LAB — GLUCOSE, CAPILLARY
Glucose-Capillary: 255 mg/dL — ABNORMAL HIGH (ref 70–99)
Glucose-Capillary: 279 mg/dL — ABNORMAL HIGH (ref 70–99)
Glucose-Capillary: 298 mg/dL — ABNORMAL HIGH (ref 70–99)
Glucose-Capillary: 438 mg/dL — ABNORMAL HIGH (ref 70–99)

## 2018-12-11 LAB — BASIC METABOLIC PANEL
Anion gap: 11 (ref 5–15)
BUN: 19 mg/dL (ref 8–23)
CO2: 38 mmol/L — ABNORMAL HIGH (ref 22–32)
Calcium: 8.6 mg/dL — ABNORMAL LOW (ref 8.9–10.3)
Chloride: 92 mmol/L — ABNORMAL LOW (ref 98–111)
Creatinine, Ser: 0.74 mg/dL (ref 0.44–1.00)
GFR calc Af Amer: >60 mL/min (ref >60)
GFR calc non Af Amer: >60 mL/min (ref >60)
Glucose, Bld: 286 mg/dL — ABNORMAL HIGH (ref 70–99)
Potassium: 4.3 mmol/L (ref 3.5–5.1)
Sodium: 141 mmol/L (ref 135–145)

## 2018-12-11 LAB — HEMOGLOBIN A1C
Hgb A1c MFr Bld: 7.5 % — ABNORMAL HIGH (ref 4.8–5.6)
Mean Plasma Glucose: 168.55 mg/dL

## 2018-12-11 LAB — MAGNESIUM: Magnesium: 1.8 mg/dL (ref 1.7–2.4)

## 2018-12-11 MED ORDER — METFORMIN HCL ER 500 MG PO TB24: 1000.0000 mg | ORAL_TABLET | Freq: Two times a day (BID) | ORAL | 1 refills | Status: DC

## 2018-12-11 MED ORDER — NITROFURANTOIN MONOHYD MACRO 100 MG PO CAPS: 100.0000 mg | ORAL_CAPSULE | Freq: Two times a day (BID) | ORAL | 0 refills | Status: AC

## 2018-12-11 MED ORDER — PNEUMOCOCCAL VAC POLYVALENT 25 MCG/0.5ML IJ INJ
0.5000 mL | INJECTION | Freq: Once | INTRAMUSCULAR | Status: DC
  Filled 2018-12-11: qty 0.5

## 2018-12-11 MED ORDER — FUROSEMIDE 40 MG PO TABS: 40.0000 mg | ORAL_TABLET | Freq: Every day | ORAL | Status: DC | PRN

## 2018-12-11 MED ORDER — INFLUENZA VAC A&B SA ADJ QUAD 0.5 ML IM PRSY
0.5000 mL | PREFILLED_SYRINGE | Freq: Once | INTRAMUSCULAR | Status: AC
  Administered 2018-12-11: 0.5 mL via INTRAMUSCULAR
  Filled 2018-12-11: qty 0.5

## 2018-12-11 MED ORDER — RIVAROXABAN 20 MG PO TABS: 20.0000 mg | ORAL_TABLET | Freq: Every day | ORAL | 0 refills | Status: DC

## 2018-12-11 MED ORDER — METOPROLOL TARTRATE 25 MG PO TABS: 25.0000 mg | ORAL_TABLET | Freq: Two times a day (BID) | ORAL | 0 refills | Status: DC

## 2018-12-11 MED ORDER — INSULIN ASPART 100 UNIT/ML ~~LOC~~ SOLN
15.0000 [IU] | Freq: Once | SUBCUTANEOUS | Status: AC
  Administered 2018-12-11: 14:00:00 15 [IU] via SUBCUTANEOUS

## 2018-12-11 MED ORDER — DILTIAZEM HCL ER COATED BEADS 180 MG PO CP24: 180.0000 mg | ORAL_CAPSULE | Freq: Every day | ORAL | 1 refills | Status: DC

## 2018-12-11 MED ORDER — POTASSIUM CHLORIDE CRYS ER 20 MEQ PO TBCR: 20.0000 meq | EXTENDED_RELEASE_TABLET | Freq: Every day | ORAL | Status: DC | PRN

## 2018-12-11 MED ORDER — PREDNISONE 20 MG PO TABS: ORAL_TABLET | ORAL | 0 refills | Status: DC

## 2018-12-11 NOTE — Discharge Summary (Signed)
Physician Discharge Summary  Eileen [name] [id number] DOB: [birth year] DOA: 12/05/2018  PCP: Lavella Lemons, PA Cardio: Bronson Ing  Admit date: 12/05/2018 Discharge date: 12/11/2018  Admitted From: Home  Disposition: Home with [facility]  Recommendations for Outpatient Follow-up:  1. Follow up with PCP in 1 weeks 2. Follow up with cardiology in 2 weeks  Home Health:  [facility], RN  Discharge Condition: STABLE   CODE STATUS: FULL    Brief Hospitalization Summary: Please see all hospital notes, images, labs for full details of the hospitalization. Brief History: 73 year old female with a history of COPD, chronic respiratory failure on 3.5 L, right-sided breast cancer in remission, atrial flutter on rivaroxaban, diabetes mellitus type 2, hypertension, dementia with behavior disturbance, chronic L1 fracture status post kyphoplasty presenting with hematochezia for approximately 1 week. There have been different accounts regarding the duration of the patient's hematochezia, but the patient confirms that it has been approximately 1 week with intermittent hematochezia. She denied any fevers, chills, abdominal pain.Patient denies any NSAIDs or new over-the-counter medications. She denies any headache, neck pain, chest pain, nausea, vomiting, diarrhea. She has chronic shortness of breath which she states is actually low but better than usual. She has a nonproductive cough. She denies any hemoptysis. In the emergency department, the patient was afebrile hemodynamically stable saturating 99% on 3.5 L. WBC was 9.6 with hemoglobin 10.7. BMP was essentially unremarkable. LFTs and lipase were unremarkable. CT of the abdomen and pelvis showed diverticulosis with inspissated secretions and numerous diverticula. There was no bowel wall thickening or obstruction. GI was consulted to assist with management.During hospitalization, pt had intermittent episodes of atrial tachycardia/MAT with HR up to 140. Due  to more sustained episodes, pt was started on diltiazem and metoprolol was titrated down to allow BP margin for titration.Pt had hypokalemia which may contributed to her afib which is being repleted  Assessment/Plan: Hematochezia -Suspect diverticular bleed -no further bleeding -diet advanced which pt is tolerating -GI consultappreciated -03/24/2017 colonoscopy--sigmoid diverticulosis, internal and external hemorrhoids -Baseline hemoglobin~10 -Presented with hemoglobin 10.7 -Holding rivaroxaban--restarton 12/12/18 per GI -no bloody BMs x 60 hours  Atrial flutter/Atrial tachycardia -Holding rivaroxaban-restarton 12/12/18 per GI -pt was having intermittent episodes of RVR with HR 130-140-->improving with titration metoprolol back up to home dose -metoprolol dose was decreasedinitiallyduetosoft BPs -resume metoprolol 25 mg BID, further titrate up outpatient as needed.  Close follow up with PCP and cardiology recommended.  -she did not receive metoprolol dose 9/27 pm or 9/28 am-->HR increased -due to more sustained episodes, pt started on diltiazem -most recent BP 122/66 at 1715 on 9/29  Chronic respiratory failure with hypoxia -Stable on 3.5 L -Start bronchodilators  COPDExacerbation -9/27--pt more dyspneic with wheeze -CXR--vascular congestion without pulmonary edema -EKG--personally reviewed--sinus, nonspecific STT changes -continuebronchodilators -continuepulmicort -start IV solumedrol>>>DC on oral prednisone x 5 more days -lasix IV x 1, then restart po lasix which she uses PRN and potassium  Diabetes mellitus type 2, uncontrolled with hyperglycemia -09/29/2018 hemoglobin A1c 8.5 -NovoLog sliding scale -resume home metformin at discharge, increase dose to 1000 mg BID with meals  Dementia with behavioral disturbance -RestartedSeroquel at bedtime  Essential hypertension -continue diltiazem CD 180 mg  -continue metoprolol 25 mg BID  Disposition Plan:  Homewith home health  Family Communication:NoFamily at bedside  Consultants:GI-Rehman / Rourk  Code Status: FULL   DVT Prophylaxis: SCDs   Discharge Diagnoses:  Principal Problem:   Rectal bleeding Active Problems:   COPD (chronic obstructive pulmonary disease) (HCC)   COPD with acute exacerbation (  [place])   Atrial flutter (HCC)   Atrial fibrillation with RVR (HCC)   Diabetes mellitus (HCC)   Lower GI bleed   Chronic respiratory failure with hypoxia (HCC)   Dementia with behavioral disturbance (HCC)   Hematochezia   Orthostatic hypotension   Discharge Instructions: Discharge Instructions    Call MD for:  difficulty breathing, headache or visual disturbances   Complete by: As directed    Call MD for:  extreme fatigue   Complete by: As directed    Call MD for:  persistant dizziness or light-headedness   Complete by: As directed    Call MD for:  persistant nausea and vomiting   Complete by: As directed    Call MD for:  severe uncontrolled pain   Complete by: As directed    Call MD for:  temperature >100.4   Complete by: As directed      Allergies as of 12/11/2018      Reactions   Codeine Other (See Comments)   "jittery"      Medication List    TAKE these medications   acetaminophen 500 MG tablet Commonly known as: TYLENOL Take 500 mg by mouth every 8 (eight) hours as needed for mild pain or headache.   diltiazem 180 MG 24 hr capsule Commonly known as: CARDIZEM CD Take 1 capsule (180 mg total) by mouth daily. Start taking on: December 12, 2018   furosemide 40 MG tablet Commonly known as: LASIX Take 1 tablet (40 mg total) by mouth daily as needed for fluid.   metFORMIN 500 MG 24 hr tablet Commonly known as: GLUCOPHAGE-XR Take 2 tablets (1,000 mg total) by mouth 2 (two) times daily with a meal. What changed:   how much to take  when to take this   metoprolol tartrate 25 MG tablet Commonly known as: LOPRESSOR Take 1 tablet (25 mg total) by  mouth 2 (two) times daily. What changed:   medication strength  how much to take   nitrofurantoin (macrocrystal-monohydrate) 100 MG capsule Commonly known as: Macrobid Take 1 capsule (100 mg total) by mouth 2 (two) times daily for 5 days.   polyethylene glycol 17 g packet Commonly known as: MIRALAX / GLYCOLAX Take 17 g by mouth daily as needed for mild constipation.   potassium chloride SA 20 MEQ tablet Commonly known as: KLOR-CON Take 1 tablet (20 mEq total) by mouth daily as needed (take only when taking lasix). What changed:   when to take this  reasons to take this   predniSONE 20 MG tablet Commonly known as: DELTASONE Take 2 PO QAM x5 days Start taking on: December 12, 2018   albuterol (2.5 MG/3ML) 0.083% nebulizer solution Commonly known as: PROVENTIL Take 2.5 mg by nebulization every 6 (six) hours as needed for wheezing or shortness of breath.   ProAir HFA 108 (90 Base) MCG/ACT inhaler Generic drug: albuterol Inhale 2 puffs into the lungs every 6 (six) hours as needed for wheezing or shortness of breath.   QUEtiapine 25 MG tablet Commonly known as: SEROQUEL Take 1 tablet (25 mg total) by mouth daily at 6 PM.   rivaroxaban 20 MG Tabs tablet Commonly known as: Xarelto Take 1 tablet (20 mg total) by mouth daily with supper. Stop Xarelto until Monday (08/26/18) in anticipation for kyphoplasty Start taking on: December 12, 2018   Trelegy Ellipta 100-62.5-25 MCG/INH Aepb Generic drug: Fluticasone-Umeclidin-Vilant Take 1 puff by mouth daily.      [facility] Follow  up.   Why: Home Health RN/PT       Herminio Commons, MD. Schedule an appointment as soon as possible for a visit in 2 week(s).   Specialty: Cardiology Contact information: [place] [address] [phone]        Lavella Lemons, PA. Schedule an appointment as soon as possible for a visit in 1 week(s).   Specialty: Physician  Assistant Contact information: [place] [address] [phone]          Allergies  Allergen Reactions  . Codeine Other (See Comments)    "jittery"   Allergies as of 12/11/2018      Reactions   Codeine Other (See Comments)   "jittery"      Medication List    TAKE these medications   acetaminophen 500 MG tablet Commonly known as: TYLENOL Take 500 mg by mouth every 8 (eight) hours as needed for mild pain or headache.   diltiazem 180 MG 24 hr capsule Commonly known as: CARDIZEM CD Take 1 capsule (180 mg total) by mouth daily. Start taking on: December 12, 2018   furosemide 40 MG tablet Commonly known as: LASIX Take 1 tablet (40 mg total) by mouth daily as needed for fluid.   metFORMIN 500 MG 24 hr tablet Commonly known as: GLUCOPHAGE-XR Take 2 tablets (1,000 mg total) by mouth 2 (two) times daily with a meal. What changed:   how much to take  when to take this   metoprolol tartrate 25 MG tablet Commonly known as: LOPRESSOR Take 1 tablet (25 mg total) by mouth 2 (two) times daily. What changed:   medication strength  how much to take   nitrofurantoin (macrocrystal-monohydrate) 100 MG capsule Commonly known as: Macrobid Take 1 capsule (100 mg total) by mouth 2 (two) times daily for 5 days.   polyethylene glycol 17 g packet Commonly known as: MIRALAX / GLYCOLAX Take 17 g by mouth daily as needed for mild constipation.   potassium chloride SA 20 MEQ tablet Commonly known as: KLOR-CON Take 1 tablet (20 mEq total) by mouth daily as needed (take only when taking lasix). What changed:   when to take this  reasons to take this   predniSONE 20 MG tablet Commonly known as: DELTASONE Take 2 PO QAM x5 days Start taking on: December 12, 2018   albuterol (2.5 MG/3ML) 0.083% nebulizer solution Commonly known as: PROVENTIL Take 2.5 mg by nebulization every 6 (six) hours as needed for wheezing or shortness of breath.   ProAir HFA 108 (90 Base)  MCG/ACT inhaler Generic drug: albuterol Inhale 2 puffs into the lungs every 6 (six) hours as needed for wheezing or shortness of breath.   QUEtiapine 25 MG tablet Commonly known as: SEROQUEL Take 1 tablet (25 mg total) by mouth daily at 6 PM.   rivaroxaban 20 MG Tabs tablet Commonly known as: Xarelto Take 1 tablet (20 mg total) by mouth daily with supper. Stop Xarelto until Monday (08/26/18) in anticipation for kyphoplasty Start taking on: December 12, 2018   Trelegy Ellipta 100-62.5-25 MCG/INH Aepb Generic drug: Fluticasone-Umeclidin-Vilant Take 1 puff by mouth daily.       Procedures/Studies: Dg Chest 2 View  Result Date: 12/05/2018 CLINICAL DATA:  Bright red blood in the stool and rectal pain beginning today. History of hemorrhoids. EXAM: CHEST - 2 VIEW COMPARISON:  11/19/2018 FINDINGS: Cardiac silhouette is mildly enlarged. No mediastinal or hilar masses. No evidence of adenopathy. Chronic prominence of  the bronchovascular markings. Additional linear lung base opacities consistent with scarring or atelectasis. No evidence of pneumonia or pulmonary edema. No pleural effusion or pneumothorax. Previously treated compression fracture at the thoracolumbar junction. No acute skeletal abnormality. IMPRESSION: No acute cardiopulmonary disease. Electronically Signed   By: Lajean Manes M.D.   On: 12/05/2018 18:02   Ct Abdomen Pelvis W Contrast  Result Date: 12/05/2018 CLINICAL DATA:  73 year old female with a history of GI bleed EXAM: CT ABDOMEN AND PELVIS WITH CONTRAST TECHNIQUE: Multidetector CT imaging of the abdomen and pelvis was performed using the standard protocol following bolus administration of intravenous contrast. CONTRAST:  181mL OMNIPAQUE IOHEXOL 300 MG/ML  SOLN COMPARISON:  04/21/2018 FINDINGS: Lower chest: Scarring/atelectasis at the lung bases. Hepatobiliary: Unremarkable liver.  Unremarkable gallbladder. Pancreas: Unremarkable Spleen: Unremarkable Adrenals/Urinary Tract:  Unremarkable appearance of the bilateral adrenal glands. Right kidney with no hydronephrosis or nephrolithiasis. Bosniak 1 cyst on the medial cortex of the right kidney on image 22. Additional subcentimeter low-density lesion on the anterior cortex of the right kidney on image 27 too small to characterize. Left kidney without hydronephrosis. Nonobstructive nephrolithiasis at the inferior collecting system, with stone measuring approximately 4 mm. Bosniak 1 cyst on the posterior cortex of the left kidney on image 26. Bosniak 1 cyst on the medial anterior cortex of the left kidney on image 28. There are additional low-density lesions which are too small to characterize. Urinary bladder is relatively decompressed. Stomach/Bowel: Small hiatal hernia. Otherwise unremarkable stomach. Small bowel unremarkable. No abnormal distention. No focal wall thickening or inflammatory changes. No transition point. Normal appendix. Mild stool burden. Colonic diverticular disease throughout the colon. Retained contrast or inspissated secretions within numerous diverticula. No focal inflammatory changes are evident. No fluid within the bowel or contrast accumulation. Vascular/Lymphatic: Atherosclerotic changes of the abdominal aorta. No aneurysm or dissection. Mesenteric arteries and renal arteries are patent. Bilateral iliac arteries and proximal femoral arteries are patent. No adenopathy. Reproductive: Focal low-density fluid or tissue within the endometrial canal on image 58. Unremarkable left and right ovary. Other: Fat containing umbilical hernia with ventral diastasis. Small loop of small bowel at this site without entrapment. Musculoskeletal: Fracture of L1, treated with vertebral augmentation. Degree of posterior retropulsion similar to the comparison. Are no acute displaced fracture. IMPRESSION: No acute CT finding of the abdomen, with no CT evidence to identify source of GI hemorrhage. Colonic diverticula without evidence of  acute inflammation. Hiatal hernia. Trace fluid or tissue within the endometrial canal, unexpected in a patient of this age. Referral for gynecologic follow-up is recommended, and potentially evaluation with ultrasound or MRI as early malignancy cannot be excluded. Aortic Atherosclerosis (ICD10-I70.0). Left-sided nonobstructive nephrolithiasis. Electronically Signed   By: Corrie Mckusick D.O.   On: 12/05/2018 18:05   Dg Chest Port 1 View  Result Date: 12/08/2018 CLINICAL DATA:  Dyspnea EXAM: PORTABLE CHEST 1 VIEW COMPARISON:  Radiograph 12/05/2018, CT 09/27/2018 FINDINGS: Lung volumes are diminished with areas of basilar atelectasis. More hazy interstitial opacities present throughout the lower lobes with cephalized, indistinct vascularity and borderline cardiomegaly. More coalescent opacities seen in the right lung base. Region of left basilar scarring blunts the costophrenic sulcus. No visible effusions. No pneumothorax. No acute osseous or soft tissue abnormality. Degenerative changes are present in the imaged spine and shoulders. IMPRESSION: 1. Findings consistent with congestive heart failure. 2. More coalescent opacities in the right lung base could reflect superimposed alveolar edema or pneumonia. 3. Lung volumes are diminished with bibasilar atelectasis. Electronically Signed  By: Lovena Le M.D.   On: 12/08/2018 20:05   Dg Chest Port 1 View  Result Date: 11/19/2018 CLINICAL DATA:  Acute respiratory failure with hypoxia. EXAM: PORTABLE CHEST 1 VIEW COMPARISON:  Radiograph of November 17, 2018. FINDINGS: Stable cardiomegaly. No pneumothorax is noted. Increased right basilar atelectasis or infiltrate is noted. Minimal left basilar subsegmental atelectasis is noted. Small pleural effusions may be present. Bony thorax is unremarkable. IMPRESSION: Increased right basilar opacity as described above. Electronically Signed   By: Marijo Conception M.D.   On: 11/19/2018 07:29   Dg Chest Port 1 View  Result  Date: 11/17/2018 CLINICAL DATA:  Acute respiratory failure.  History of COPD. EXAM: PORTABLE CHEST 1 VIEW COMPARISON:  Single-view of the chest 11/15/2018 and 11/06/2018. CT chest 09/27/2018. FINDINGS: The lungs are emphysematous. Right basilar opacity seen on the most recent examination has resolved. Minimal atelectasis left lung base noted. Heart size is enlarged. No pneumothorax or pleural fluid. IMPRESSION: Resolved right basilar airspace opacity. No change in left basilar subsegmental atelectasis. Emphysema. Atherosclerosis. Electronically Signed   By: Inge Rise M.D.   On: 11/17/2018 09:06   Dg Chest Portable 1 View  Result Date: 11/16/2018 CLINICAL DATA:  Respiratory distress EXAM: PORTABLE CHEST 1 VIEW COMPARISON:  November 06, 2018 FINDINGS: There is mild cardiomegaly. Streaky airspace opacity seen at the right lung base. The left lung is clear. No acute osseous abnormality. IMPRESSION: Streaky airspace opacity at the right lung base which could be due to atelectasis and/or early infectious etiology. Electronically Signed   By: Prudencio Pair M.D.   On: 11/16/2018 00:07   Dg Swallowing Func-speech Pathology  Result Date: 11/21/2018 Objective Swallowing Evaluation: Type of Study: MBS-Modified Barium Swallow Study  Patient Details Name: Eileen [name] MRN: [id number] Date of Birth: 1945/12/07 Today's Date: 11/21/2018 Time: SLP Start Time (ACUTE ONLY): 1210 -SLP Stop Time (ACUTE ONLY): 1245 SLP Time Calculation (min) (ACUTE ONLY): 35 min Past Medical History: Past Medical History: Diagnosis Date . Asthma  . Atrial flutter ([place])  . Cancer [facility])   Right breast . COPD (chronic obstructive pulmonary disease) ([place])  . Dementia ([place])  . History of breast cancer   right breast . Hypertension  . On home O2  . Type 2 diabetes mellitus ([place])  Past Surgical History: Past Surgical History: Procedure Laterality Date . BREAST SURGERY  2011  Right breast mastectomy . CARDIOVERSION N/A 06/27/2018  Procedure:  CARDIOVERSION;  Surgeon: Arnoldo Lenis, MD;  [facility];  Service: Endoscopy;  Laterality: N/A; . CESAREAN SECTION   . COLONOSCOPY N/A 01/22/2018  Procedure: COLONOSCOPY;  Surgeon: Rogene Houston, MD;  Location: [facility];  Service: Endoscopy;  Laterality: N/A; . HERNIA REPAIR    RIH . IR VERTEBROPLASTY LUMBAR BX INC UNI/BIL INC/INJECT/IMAGING  09/05/2018 . MASTECTOMY  2011  right breast . TEE WITHOUT CARDIOVERSION N/A 06/27/2018  Procedure: TRANSESOPHAGEAL ECHOCARDIOGRAM (TEE) WITH PROPOFOL;  Surgeon: Arnoldo Lenis, MD;  Location: [facility];  Service: Endoscopy;  Laterality: N/A; HPI: Eileen A Kattner is a 73 y.o. female with a multiple medical problems as documented below but most importantly COPD and CHF who presents the emergency department today with respiratory distress.  Patient, on review of records, is here often for respiratory failure with hypoxia and hypercarbia.  Symptoms altered with that as well.  Onset tonight was similar.  EMS got to her and her sats were low they gave her albuterol brought here for further evaluation.  No  further history able to be obtained secondary to patient's mental status. BSE requested.  Subjective: Pt alert and cooperative, "doing better" Assessment / Plan / Recommendation CHL IP CLINICAL IMPRESSIONS 11/21/2018 Clinical Impression Pt presents with min oropharyngeal dysphagia characterized by min premature spillage of liquids to the valleculae and spilling to the pyriforms with occasional flash penetration without aspiration except for one episode of overt aspiration before the swallow when taking straw sips of thin liquids to due premature spillage and suspected breath inhalation. Aspiration was silent and was not removed despite SLP prompts for coughing. Aspiration of thins with a straw was mitigated through use of chin tuck and small sips. Recommend regular textures and thin liquids, NO STRAWS, swallow 2x for each swallow and clear throat  periodically. Pt appreciative for recommendations and verbalized that she will take small cup sips of liquids. SLP will follow for diet tolerance x1, however no SLP f/u post acute necessary at this time.  SLP Visit Diagnosis Dysphagia, oropharyngeal phase (R13.12) Attention and concentration deficit following -- Frontal lobe and executive function deficit following -- Impact on safety and function Mild aspiration risk   CHL IP TREATMENT RECOMMENDATION 11/21/2018 Treatment Recommendations Therapy as outlined in treatment plan below   Prognosis 11/21/2018 Prognosis for Safe Diet Advancement Good Barriers to Reach Goals -- Barriers/Prognosis Comment -- CHL IP DIET RECOMMENDATION 11/21/2018 SLP Diet Recommendations Dysphagia 3 (Mech soft) solids;Thin liquid Liquid Administration via Cup;No straw Medication Administration Whole meds with puree Compensations Slow rate;Small sips/bites Postural Changes Remain semi-upright after after feeds/meals (Comment);Seated upright at 90 degrees   CHL IP OTHER RECOMMENDATIONS 11/21/2018 Recommended Consults -- Oral Care Recommendations Oral care BID;Staff/trained caregiver to provide oral care Other Recommendations Clarify dietary restrictions   CHL IP FOLLOW UP RECOMMENDATIONS 11/21/2018 Follow up Recommendations None   CHL IP FREQUENCY AND DURATION 11/21/2018 Speech Therapy Frequency (ACUTE ONLY) min 2x/week Treatment Duration 1 week      CHL IP ORAL PHASE 11/21/2018 Oral Phase Impaired Oral - Pudding Teaspoon -- Oral - Pudding Cup -- Oral - Honey Teaspoon -- Oral - Honey Cup -- Oral - Nectar Teaspoon -- Oral - Nectar Cup -- Oral - Nectar Straw -- Oral - Thin Teaspoon -- Oral - Thin Cup -- Oral - Thin Straw -- Oral - Puree -- Oral - Mech Soft -- Oral - Regular -- Oral - Multi-Consistency -- Oral - Pill -- Oral Phase - Comment --  CHL IP PHARYNGEAL PHASE 11/21/2018 Pharyngeal Phase Impaired Pharyngeal- Pudding Teaspoon -- Pharyngeal -- Pharyngeal- Pudding Cup -- Pharyngeal -- Pharyngeal-  Honey Teaspoon -- Pharyngeal -- Pharyngeal- Honey Cup -- Pharyngeal -- Pharyngeal- Nectar Teaspoon -- Pharyngeal -- Pharyngeal- Nectar Cup -- Pharyngeal -- Pharyngeal- Nectar Straw -- Pharyngeal -- Pharyngeal- Thin Teaspoon Delayed swallow initiation-vallecula;Delayed swallow initiation-pyriform sinuses Pharyngeal -- Pharyngeal- Thin Cup Delayed swallow initiation-vallecula;Delayed swallow initiation-pyriform sinuses;Penetration/Aspiration during swallow Pharyngeal Material enters airway, CONTACTS cords and then ejected out;Material does not enter airway Pharyngeal- Thin Straw Delayed swallow initiation-pyriform sinuses;Penetration/Aspiration before swallow;Reduced airway/laryngeal closure;Trace aspiration;Moderate aspiration Pharyngeal Material enters airway, passes BELOW cords without attempt by patient to eject out (silent aspiration) Pharyngeal- Puree Delayed swallow initiation-vallecula Pharyngeal -- Pharyngeal- Mechanical Soft -- Pharyngeal -- Pharyngeal- Regular Delayed swallow initiation-vallecula Pharyngeal -- Pharyngeal- Multi-consistency -- Pharyngeal -- Pharyngeal- Pill Penetration/Aspiration during swallow Pharyngeal Material enters airway, remains ABOVE vocal cords then ejected out Pharyngeal Comment mild/mod aspiration of straw sips thin liquid before the swallow due to premature spillage and reduced vocal fold closure, silent  CHL IP CERVICAL ESOPHAGEAL PHASE  11/21/2018 Cervical Esophageal Phase WFL Pudding Teaspoon -- Pudding Cup -- Honey Teaspoon -- Honey Cup -- Nectar Teaspoon -- Nectar Cup -- Nectar Straw -- Thin Teaspoon -- Thin Cup -- Thin Straw -- Puree -- Mechanical Soft -- Regular -- Multi-consistency -- Pill -- Cervical Esophageal Comment -- Thank you, [name], CCC-SLP [phone] [place] 11/21/2018, 5:59 PM                 Subjective: Pt without complaints, denies chest pain, denies palpitations, denies abdominal pain. Wants to go home.   Discharge Exam: Vitals:    12/11/18 0837 12/11/18 0838  BP: 124/60 124/60  Pulse:  (!) 101  Resp:    Temp:    SpO2:     Vitals:   12/11/18 0633 12/11/18 0751 12/11/18 0837 12/11/18 0838  BP:   124/60 124/60  Pulse:    (!) 101  Resp:      Temp:      TempSrc:      SpO2: 95% 92%    Weight:      Height:       General: Pt is alert, awake, not in acute distress Cardiovascular: RRR, S1/S2 +, no rubs, no gallops Respiratory: CTA bilaterally, no wheezing, no rhonchi Abdominal: Soft, NT, ND, bowel sounds + Extremities: no edema, no cyanosis   The results of significant diagnostics from this hospitalization (including imaging, microbiology, ancillary and laboratory) are listed below for reference.     Microbiology: Recent Results (from the past 240 hour(s))  Urine culture     Status: Abnormal   Collection Time: 12/05/18  7:12 PM   Specimen: Urine, Clean Catch  Result Value Ref Range Status   Specimen Description   Final    URINE, CLEAN CATCH Performed at [facility], [address] [place], [place] [address]    Special Requests   Final    NONE Performed at [facility], [address] [place], [place] [address]    Culture (A)  Final    >=100,000 COLONIES/mL ESCHERICHIA COLI Confirmed Extended Spectrum Beta-Lactamase Producer (ESBL).  In bloodstream infections from ESBL organisms, carbapenems are preferred over piperacillin/tazobactam. They are shown to have a lower risk of mortality.    Report Status 12/08/2018 FINAL  Final   Organism ID, Bacteria ESCHERICHIA COLI (A)  Final      Susceptibility   Escherichia coli - MIC*    AMPICILLIN >=32 RESISTANT Resistant     CEFAZOLIN >=64 RESISTANT Resistant     CEFTRIAXONE >=64 RESISTANT Resistant     CIPROFLOXACIN >=4 RESISTANT Resistant     GENTAMICIN <=1 SENSITIVE Sensitive     IMIPENEM <=0.25 SENSITIVE Sensitive     NITROFURANTOIN <=16 SENSITIVE Sensitive     TRIMETH/SULFA <=20 SENSITIVE Sensitive     AMPICILLIN/SULBACTAM 4 SENSITIVE Sensitive      PIP/TAZO <=4 SENSITIVE Sensitive     Extended ESBL POSITIVE Resistant     * >=100,000 COLONIES/mL ESCHERICHIA COLI  SARS Coronavirus 2 [facility] order, Performed in [facility] hospital lab) Nasopharyngeal Nasopharyngeal Swab     Status: None   Collection Time: 12/05/18  7:14 PM   Specimen: Nasopharyngeal Swab  Result Value Ref Range Status   SARS Coronavirus 2 NEGATIVE NEGATIVE Final    Comment: (NOTE) If result is NEGATIVE SARS-CoV-2 target nucleic acids are NOT DETECTED. The SARS-CoV-2 RNA is generally detectable in upper and lower  respiratory specimens during the acute phase of infection. The lowest  concentration of SARS-CoV-2 viral copies this assay can detect is  250  copies / mL. A negative result does not preclude SARS-CoV-2 infection  and should not be used as the sole basis for treatment or other  patient management decisions.  A negative result may occur with  improper specimen collection / handling, submission of specimen other  than nasopharyngeal swab, presence of viral mutation(s) within the  areas targeted by this assay, and inadequate number of viral copies  (<250 copies / mL). A negative result must be combined with clinical  observations, patient history, and epidemiological information. If result is POSITIVE SARS-CoV-2 target nucleic acids are DETECTED. The SARS-CoV-2 RNA is generally detectable in upper and lower  respiratory specimens dur ing the acute phase of infection.  Positive  results are indicative of active infection with SARS-CoV-2.  Clinical  correlation with patient history and other diagnostic information is  necessary to determine patient infection status.  Positive results do  not rule out bacterial infection or co-infection with other viruses. If result is PRESUMPTIVE POSTIVE SARS-CoV-2 nucleic acids MAY BE PRESENT.   A presumptive positive result was obtained on the submitted specimen  and confirmed on repeat testing.  While 2019 novel  coronavirus  (SARS-CoV-2) nucleic acids may be present in the submitted sample  additional confirmatory testing may be necessary for epidemiological  and / or clinical management purposes  to differentiate between  SARS-CoV-2 and other Sarbecovirus currently known to infect humans.  If clinically indicated additional testing with an alternate test  methodology [phone]) is advised. The SARS-CoV-2 RNA is generally  detectable in upper and lower respiratory sp ecimens during the acute  phase of infection. The expected result is Negative. Fact Sheet for Patients:  StrictlyIdeas.no Fact Sheet for Healthcare Providers: BankingDealers.co.za This test is not yet approved or cleared by the [place] FDA and has been authorized for detection and/or diagnosis of SARS-CoV-2 by FDA under an Emergency Use Authorization (EUA).  This EUA will remain in effect (meaning this test can be used) for the duration of the COVID-19 declaration under Section 564(b)(1) of the Act, 21 U.S.C. section 360bbb-3(b)(1), unless the authorization is terminated or revoked sooner. Performed at [facility], [address] [place], [place] [address]      Labs: BNP (last 3 results) Recent Labs    09/26/18 1419 10/24/18 0000 11/15/18 2330  BNP 302.0* 318.0* 0000000*   Basic Metabolic Panel: Recent Labs  Lab 12/06/18 0700 12/07/18 0701 12/08/18 0626 12/10/18 0623 12/11/18 0524  NA 141 144 145 140 141  K 4.2 3.4* 4.2 4.3 4.3  CL 95* 97* 100 92* 92*  CO2 38* 37* 37* 37* 38*  GLUCOSE 168* 111* 129* 249* 286*  BUN 11 12 14 18 19   CREATININE 0.55 0.51 0.60 0.76 0.74  CALCIUM 8.9 9.0 9.1 8.8* 8.6*  MG  --   --  1.7  --  1.8   Liver Function Tests: Recent Labs  Lab 12/05/18 1515 12/06/18 0700  AST 26 21  ALT 27 21  ALKPHOS 110 83  BILITOT 0.7 0.5  PROT 6.7 5.4*  ALBUMIN 3.5 2.8*   Recent Labs  Lab 12/05/18 1515  LIPASE 30   No results for  input(s): AMMONIA in the last 168 hours. CBC: Recent Labs  Lab 12/05/18 1515 12/06/18 0700 12/07/18 0701 12/08/18 0626 12/10/18 0623 12/11/18 0524  WBC 9.6 7.2 7.0 7.2 8.5 10.2  NEUTROABS 6.2  --   --   --   --   --   HGB 10.7* 9.0* 8.9* 9.4* 8.6* 7.9*  HCT 38.4 33.0* 31.9* 34.1* 30.3* 27.6*  MCV 98.0 98.8 97.9 99.7 95.6 95.2  PLT 194 159 169 180 281 329   Cardiac Enzymes: No results for input(s): CKTOTAL, CKMB, CKMBINDEX, TROPONINI in the last 168 hours. BNP: Invalid input(s): POCBNP CBG: Recent Labs  Lab 12/10/18 1816 12/10/18 2035 12/11/18 0041 12/11/18 0419 12/11/18 0727  GLUCAP 311* 377* 298* 279* 255*   D-Dimer No results for input(s): DDIMER in the last 72 hours. Hgb A1c No results for input(s): HGBA1C in the last 72 hours. Lipid Profile No results for input(s): CHOL, HDL, LDLCALC, TRIG, CHOLHDL, LDLDIRECT in the last 72 hours. Thyroid function studies No results for input(s): TSH, T4TOTAL, T3FREE, THYROIDAB in the last 72 hours.  Invalid input(s): FREET3 Anemia work up No results for input(s): VITAMINB12, FOLATE, FERRITIN, TIBC, IRON, RETICCTPCT in the last 72 hours. Urinalysis    Component Value Date/Time   COLORURINE YELLOW 12/05/2018 1912   APPEARANCEUR CLEAR 12/05/2018 1912   LABSPEC >1.046 (H) 12/05/2018 1912   PHURINE 6.0 12/05/2018 1912   GLUCOSEU NEGATIVE 12/05/2018 1912   HGBUR LARGE (A) 12/05/2018 1912   BILIRUBINUR NEGATIVE 12/05/2018 1912   KETONESUR NEGATIVE 12/05/2018 1912   PROTEINUR NEGATIVE 12/05/2018 1912   UROBILINOGEN 0.2 09/09/2009 1010   NITRITE NEGATIVE 12/05/2018 1912   LEUKOCYTESUR NEGATIVE 12/05/2018 1912   Sepsis Labs Invalid input(s): PROCALCITONIN,  WBC,  LACTICIDVEN Microbiology Recent Results (from the past 240 hour(s))  Urine culture     Status: Abnormal   Collection Time: 12/05/18  7:12 PM   Specimen: Urine, Clean Catch  Result Value Ref Range Status   Specimen Description   Final    URINE, CLEAN  CATCH Performed at [facility], [address], [place], [place] [address]    Special Requests   Final    NONE Performed at [facility], [address] [place], [place] [address]    Culture (A)  Final    >=100,000 COLONIES/mL ESCHERICHIA COLI Confirmed Extended Spectrum Beta-Lactamase Producer (ESBL).  In bloodstream infections from ESBL organisms, carbapenems are preferred over piperacillin/tazobactam. They are shown to have a lower risk of mortality.    Report Status 12/08/2018 FINAL  Final   Organism ID, Bacteria ESCHERICHIA COLI (A)  Final      Susceptibility   Escherichia coli - MIC*    AMPICILLIN >=32 RESISTANT Resistant     CEFAZOLIN >=64 RESISTANT Resistant     CEFTRIAXONE >=64 RESISTANT Resistant     CIPROFLOXACIN >=4 RESISTANT Resistant     GENTAMICIN <=1 SENSITIVE Sensitive     IMIPENEM <=0.25 SENSITIVE Sensitive     NITROFURANTOIN <=16 SENSITIVE Sensitive     TRIMETH/SULFA <=20 SENSITIVE Sensitive     AMPICILLIN/SULBACTAM 4 SENSITIVE Sensitive     PIP/TAZO <=4 SENSITIVE Sensitive     Extended ESBL POSITIVE Resistant     * >=100,000 COLONIES/mL ESCHERICHIA COLI  SARS Coronavirus 2 [facility] order, Performed in [facility] hospital lab) Nasopharyngeal Nasopharyngeal Swab     Status: None   Collection Time: 12/05/18  7:14 PM   Specimen: Nasopharyngeal Swab  Result Value Ref Range Status   SARS Coronavirus 2 NEGATIVE NEGATIVE Final    Comment: (NOTE) If result is NEGATIVE SARS-CoV-2 target nucleic acids are NOT DETECTED. The SARS-CoV-2 RNA is generally detectable in upper and lower  respiratory specimens during the acute phase of infection. The lowest  concentration of SARS-CoV-2 viral copies this assay can detect is 250  copies / mL. A negative result does not preclude SARS-CoV-2  infection  and should not be used as the sole basis for treatment or other  patient management decisions.  A negative result may occur with  improper specimen collection / handling,  submission of specimen other  than nasopharyngeal swab, presence of viral mutation(s) within the  areas targeted by this assay, and inadequate number of viral copies  (<250 copies / mL). A negative result must be combined with clinical  observations, patient history, and epidemiological information. If result is POSITIVE SARS-CoV-2 target nucleic acids are DETECTED. The SARS-CoV-2 RNA is generally detectable in upper and lower  respiratory specimens dur ing the acute phase of infection.  Positive  results are indicative of active infection with SARS-CoV-2.  Clinical  correlation with patient history and other diagnostic information is  necessary to determine patient infection status.  Positive results do  not rule out bacterial infection or co-infection with other viruses. If result is PRESUMPTIVE POSTIVE SARS-CoV-2 nucleic acids MAY BE PRESENT.   A presumptive positive result was obtained on the submitted specimen  and confirmed on repeat testing.  While 2019 novel coronavirus  (SARS-CoV-2) nucleic acids may be present in the submitted sample  additional confirmatory testing may be necessary for epidemiological  and / or clinical management purposes  to differentiate between  SARS-CoV-2 and other Sarbecovirus currently known to infect humans.  If clinically indicated additional testing with an alternate test  methodology [phone]) is advised. The SARS-CoV-2 RNA is generally  detectable in upper and lower respiratory sp ecimens during the acute  phase of infection. The expected result is Negative. Fact Sheet for Patients:  StrictlyIdeas.no Fact Sheet for Healthcare Providers: BankingDealers.co.za This test is not yet approved or cleared by the [place] FDA and has been authorized for detection and/or diagnosis of SARS-CoV-2 by FDA under an Emergency Use Authorization (EUA).  This EUA will remain in effect (meaning this test can be  used) for the duration of the COVID-19 declaration under Section 564(b)(1) of the Act, 21 U.S.C. section 360bbb-3(b)(1), unless the authorization is terminated or revoked sooner. Performed at [facility], [address], [place], [place] [address]    Time coordinating discharge: 33 minutes   SIGNED:  Irwin Brakeman, MD  [facility] 12/11/2018, 10:50 AM How to contact the [facility] Attending or Consulting provider [place] or covering provider during after hours [place], for this patient?  1. Check the care team in [facility] and look for a) attending/consulting TRH provider listed and b) the [facility] team listed 2. Log into [email or web address] and use [place]'s universal password to access. If you do not have the password, please contact the hospital operator. 3. Locate the [facility] provider you are looking for under [facility] and page to a number that you can be directly reached. 4. If you still have difficulty reaching the provider, please page the [facility] (Director on Call) for the Hospitalists listed on amion for assistance.

## 2018-12-11 NOTE — TOC Transition Note (Signed)
Transition of Care [facility]) - CM/SW Discharge Note   Patient Details  Name: Eileen [name] MRN: [id number] Date of Birth: 1946-02-26  [facility]) CM/SW Contact:  Shade Flood, LCSW Phone Number: 12/11/2018, 11:11 AM   Clinical Narrative:     Pt stable for dc today per MD. Pt will be returning home with [facility] services resuming care. Updated Georgina Snell at [place].   Follow up appointment with pt's PCP scheduled and added to the AVS.  There are no other TOC needs for dc.  Final next level of care: [place] Barriers to Discharge: Barriers Resolved   Patient Goals and CMS Choice Patient states their goals for this hospitalization and ongoing recovery are:: to return home.      Discharge Placement                       Discharge Plan and Services     Post Acute Care Choice: Resumption of Svcs/PTA Provider                               Social Determinants of Health (SDOH) Interventions     Readmission Risk Interventions Readmission Risk Prevention Plan 12/11/2018 12/06/2018 11/20/2018  Transportation Screening - Complete -  PCP or Specialist Appt within 5-7 Days - - -  Home Care Screening - - -  Medication Review (RN CM) - - -  Medication Review (RN Transport planner) - Complete -  PCP or Specialist appointment within 3-5 days of discharge - Not Complete -  [place] or [place] - Complete -  SW Recovery Care/Counseling Consult - Complete -  Palliative Care Screening - Not Complete Complete  Comments - - -  [place] Not Applicable Not Complete -  Some recent data might be hidden

## 2018-12-11 NOTE — Care Management Important Message (Signed)
Important Message  Patient Details  Name: Eileen [name] MRN: [id number] Date of Birth: 1945/08/12   Medicare Important Message Given:  Yes(given to nurse to deliver to patient due to contact precautions)     [name] 12/11/2018, 2:39 PM

## 2018-12-11 NOTE — Discharge Instructions (Signed)
Please establish care with Dr. Merlene Laughter regarding tremors  Gastrointestinal Bleeding Gastrointestinal (GI) bleeding is bleeding somewhere along the path that food travels through the body (digestive tract). This path is anywhere between the mouth and the opening of the butt (anus). You may have blood in your poop (stool) or have black poop. If you throw up (vomit), there may be blood in it. This condition can be mild, serious, or even life-threatening. If you have a lot of bleeding, you may need to stay in the hospital. What are the causes? This condition may be caused by:  Irritation and swelling of the esophagus (esophagitis). The esophagus is part of the body that moves food from your mouth to your stomach.  Swollen veins in the butt (hemorrhoids).  Areas of painful tearing in the opening of the butt (anal fissures). These are often caused by passing hard poop.  Pouches that form on the colon over time (diverticulosis).  Irritation and swelling (diverticulitis) in areas where pouches have formed on the colon.  Growths (polyps) or cancer. Colon cancer often starts out as growths that are not cancer.  Irritation of the stomach lining (gastritis).  Sores (ulcers) in the stomach. What increases the risk? You are more likely to develop this condition if you:  Have a certain type of infection in your stomach (Helicobacter pylori infection).  Take certain medicines.  Smoke.  Drink alcohol. What are the signs or symptoms? Common symptoms of this condition include:  Throwing up (vomiting) material that has bright red blood in it. It may look like coffee grounds.  Changes in your poop. The poop may: ? Have red blood in it. ? Be black, look like tar, and smell stronger than normal. ? Be red.  Pain or cramping in the belly (abdomen). How is this treated? Treatment for this condition depends on the cause of the bleeding. For example:  Sometimes, the bleeding can be stopped during  a procedure that is done to find the problem (endoscopy or colonoscopy).  Medicines can be used to: ? Help control irritation, swelling, or infection. ? Reduce acid in your stomach.  Certain problems can be treated with: ? Creams. ? Medicines that are put in the butt (suppositories). ? Warm baths.  Surgery is sometimes needed.  If you lose a lot of blood, you may need a blood transfusion. If bleeding is mild, you may be allowed to go home. If there is a lot of bleeding, you will need to stay in the hospital. Follow these instructions at home:   Take over-the-counter and prescription medicines only as told by your doctor.  Eat foods that have a lot of fiber in them. These foods include beans, whole grains, and fresh fruits and vegetables. You can also try eating 1-3 prunes each day.  Drink enough fluid to keep your pee (urine) pale yellow.  Keep all follow-up visits as told by your doctor. This is important. Contact a doctor if:  Your symptoms do not get better. Get help right away if:  Your bleeding does not stop.  You feel dizzy or you pass out (faint).  You feel weak.  You have very bad cramps in your back or belly.  You pass large clumps of blood (clots) in your poop.  Your symptoms are getting worse.  You have chest pain or fast heartbeats. Summary  GI bleeding is bleeding somewhere along the path that food travels through the body (digestive tract).  This bleeding can be caused by many things.  Treatment depends on the cause of the bleeding.  Take medicines only as told by your doctor.  Keep all follow-up visits as told by your doctor. This is important. This information is not intended to replace advice given to you by your health care provider. Make sure you discuss any questions you have with your health care provider. Document Released: 12/07/2007 Document Revised: 10/10/2017 Document Reviewed: 10/10/2017 Elsevier Patient Education  [place] of Breath, Adult Shortness of breath means you have trouble breathing. Shortness of breath could be a sign of a medical problem. Follow these instructions at home:   Watch for any changes in your symptoms.  Do not use any products that contain nicotine or tobacco, such as cigarettes, e-cigarettes, and chewing tobacco.  Do not smoke. Smoking can cause shortness of breath. If you need help to quit smoking, ask your doctor.  Avoid things that can make it harder to breathe, such as: ? Mold. ? Dust. ? Air pollution. ? Chemical smells. ? Things that can cause allergy symptoms (allergens), if you have allergies.  Keep your living space clean. Use products that help remove mold and dust.  Rest as needed. Slowly return to your normal activities.  Take over-the-counter and prescription medicines only as told by your doctor. This includes oxygen therapy and inhaled medicines.  Keep all follow-up visits as told by your doctor. This is important. Contact a doctor if:  Your condition does not get better as soon as expected.  You have a hard time doing your normal activities, even after you rest.  You have new symptoms. Get help right away if:  Your shortness of breath gets worse.  You have trouble breathing when you are resting.  You feel light-headed or you pass out (faint).  You have a cough that is not helped by medicines.  You cough up blood.  You have pain with breathing.  You have pain in your chest, arms, shoulders, or belly (abdomen).  You have a fever.  You cannot walk up stairs.  You cannot exercise the way you normally do. These symptoms may represent a serious problem that is an emergency. Do not wait to see if the symptoms will go away. Get medical help right away. Call your local emergency services (911 in the [place] Do not drive yourself to the hospital. Summary  Shortness of breath is when you have trouble breathing enough air. It can be a  sign of a medical problem.  Avoid things that make it hard for you to breathe, such as smoking, pollution, mold, and dust.  Watch for any changes in your symptoms. Contact your doctor if you do not get better or you get worse. This information is not intended to replace advice given to you by your health care provider. Make sure you discuss any questions you have with your health care provider. Document Released: 08/16/2007 Document Revised: 07/30/2017 Document Reviewed: 07/30/2017 Elsevier Patient Education  2020 [place].   IMPORTANT INFORMATION: PAY CLOSE ATTENTION   PHYSICIAN DISCHARGE INSTRUCTIONS  Follow with Primary care provider  Lavella Lemons, PA  and other consultants as instructed by your Hospitalist Physician  [place] IF SYMPTOMS COME BACK, WORSEN OR NEW PROBLEM DEVELOPS   Please note: You were cared for by a hospitalist during your hospital stay. Every effort will be made to forward records to your primary care provider.  You can request that your primary care provider send  for your hospital records if they have not received them.  Once you are discharged, your primary care physician will handle any further medical issues. Please note that NO REFILLS for any discharge medications will be authorized once you are discharged, as it is imperative that you return to your primary care physician (or establish a relationship with a primary care physician if you do not have one) for your post hospital discharge needs so that they can reassess your need for medications and monitor your lab values.  Please get a complete blood count and chemistry panel checked by your Primary MD at your next visit, and again as instructed by your Primary MD.  Get Medicines reviewed and adjusted: Please take all your medications with you for your next visit with your Primary MD  Laboratory/radiological data: Please request your Primary MD to go over all  hospital tests and procedure/radiological results at the follow up, please ask your primary care provider to get all Hospital records sent to his/her office.  In some cases, they will be blood work, cultures and biopsy results pending at the time of your discharge. Please request that your primary care provider follow up on these results.  If you are diabetic, please bring your blood sugar readings with you to your follow up appointment with primary care.    Please call and make your follow up appointments as soon as possible.    Also Note the following: If you experience worsening of your admission symptoms, develop shortness of breath, life threatening emergency, suicidal or homicidal thoughts you must seek medical attention immediately by calling 911 or calling your MD immediately  if symptoms less severe.  You must read complete instructions/literature along with all the possible adverse reactions/side effects for all the Medicines you take and that have been prescribed to you. Take any new Medicines after you have completely understood and accpet all the possible adverse reactions/side effects.   Do not drive when taking Pain medications or sleeping medications (Benzodiazepines)  Do not take more than prescribed Pain, Sleep and Anxiety Medications. It is not advisable to combine anxiety,sleep and pain medications without talking with your primary care practitioner  Special Instructions: If you have smoked or chewed Tobacco  in the last 2 yrs please stop smoking, stop any regular Alcohol  and or any Recreational drug use.  Wear Seat belts while driving.  Do not drive if taking any narcotic, mind altering or controlled substances or recreational drugs or alcohol.

## 2018-12-13 ENCOUNTER — Other Ambulatory Visit: Payer: Self-pay | Admitting: *Deleted

## 2018-12-13 DIAGNOSIS — J449 Chronic obstructive pulmonary disease, unspecified: Secondary | ICD-10-CM | POA: Diagnosis not present

## 2018-12-13 DIAGNOSIS — I4892 Unspecified atrial flutter: Secondary | ICD-10-CM | POA: Diagnosis not present

## 2018-12-13 DIAGNOSIS — J9611 Chronic respiratory failure with hypoxia: Secondary | ICD-10-CM | POA: Diagnosis not present

## 2018-12-13 DIAGNOSIS — E119 Type 2 diabetes mellitus without complications: Secondary | ICD-10-CM | POA: Diagnosis not present

## 2018-12-13 NOTE — Patient Outreach (Signed)
Pt hospitalized 9/24-9/30/20 for blood in stool/ hematochezia, suspect diverticular bleed- resolved, outreach call to patient's daughter for hospital follow up, (primary care MD completes transition of care)  pt answered the telephone and talked with a whisper/ mumbled,  RN CM had difficulty understanding pt and asked to speak with patient's daughter [name], pt did not get her daughter to the phone with RN CM asking multiple times to speak with her daughter, RN CM unable to complete assessment over the phone, call ended.  PLAN Attempt to reach patient's daughter next week  Jacqlyn Larsen [facility], [place] Coordinator [phone]

## 2018-12-16 DIAGNOSIS — J9601 Acute respiratory failure with hypoxia: Secondary | ICD-10-CM | POA: Diagnosis not present

## 2018-12-16 DIAGNOSIS — J9610 Chronic respiratory failure, unspecified whether with hypoxia or hypercapnia: Secondary | ICD-10-CM | POA: Diagnosis not present

## 2018-12-16 DIAGNOSIS — J9602 Acute respiratory failure with hypercapnia: Secondary | ICD-10-CM | POA: Diagnosis not present

## 2018-12-16 DIAGNOSIS — J690 Pneumonitis due to inhalation of food and vomit: Secondary | ICD-10-CM | POA: Diagnosis not present

## 2018-12-16 DIAGNOSIS — J439 Emphysema, unspecified: Secondary | ICD-10-CM | POA: Diagnosis not present

## 2018-12-17 DIAGNOSIS — J690 Pneumonitis due to inhalation of food and vomit: Secondary | ICD-10-CM | POA: Diagnosis not present

## 2018-12-17 DIAGNOSIS — J9602 Acute respiratory failure with hypercapnia: Secondary | ICD-10-CM | POA: Diagnosis not present

## 2018-12-17 DIAGNOSIS — J9601 Acute respiratory failure with hypoxia: Secondary | ICD-10-CM | POA: Diagnosis not present

## 2018-12-17 DIAGNOSIS — J439 Emphysema, unspecified: Secondary | ICD-10-CM | POA: Diagnosis not present

## 2018-12-17 DIAGNOSIS — J9610 Chronic respiratory failure, unspecified whether with hypoxia or hypercapnia: Secondary | ICD-10-CM | POA: Diagnosis not present

## 2018-12-19 ENCOUNTER — Other Ambulatory Visit: Payer: Self-pay | Admitting: *Deleted

## 2018-12-19 NOTE — Patient Outreach (Signed)
Outreach call to pt/ daughter for telephone assessment, no answer to telephone and no option to leave voicemail, RN CM called [facility] for collaboration, spoke with Jinny Blossom who reports they are seeing pt in the home and restarted patient's care Monday October 5 with RN, PT, ST and social work, Barista reports their Education officer, museum had previously not been able to see pt due to her readmitting to the hospital, social worker plans on discussing goals of care, advanced directives, discussion about hospice (family previously refused).  RN CM let Jinny Blossom know about difficulty of getting in touch with pt, she will email her team and they can let pt, daughter know RN CM is trying to reach them and give RN CM contact number, Megan gave RN CM another contact number for daughter [name] [phone].  RN CM called this number and no answer to telephone and no option to leave voicemail.  PLAN Outreach pt, daughter in 3-4 business days  Jacqlyn Larsen [facility], [place] Coordinator [phone]

## 2018-12-20 DIAGNOSIS — J9602 Acute respiratory failure with hypercapnia: Secondary | ICD-10-CM | POA: Diagnosis not present

## 2018-12-20 DIAGNOSIS — J9610 Chronic respiratory failure, unspecified whether with hypoxia or hypercapnia: Secondary | ICD-10-CM | POA: Diagnosis not present

## 2018-12-20 DIAGNOSIS — J690 Pneumonitis due to inhalation of food and vomit: Secondary | ICD-10-CM | POA: Diagnosis not present

## 2018-12-20 DIAGNOSIS — J439 Emphysema, unspecified: Secondary | ICD-10-CM | POA: Diagnosis not present

## 2018-12-20 DIAGNOSIS — J9601 Acute respiratory failure with hypoxia: Secondary | ICD-10-CM | POA: Diagnosis not present

## 2018-12-22 DIAGNOSIS — J690 Pneumonitis due to inhalation of food and vomit: Secondary | ICD-10-CM | POA: Diagnosis not present

## 2018-12-22 DIAGNOSIS — I4892 Unspecified atrial flutter: Secondary | ICD-10-CM | POA: Diagnosis not present

## 2018-12-22 DIAGNOSIS — R1310 Dysphagia, unspecified: Secondary | ICD-10-CM | POA: Diagnosis not present

## 2018-12-22 DIAGNOSIS — I10 Essential (primary) hypertension: Secondary | ICD-10-CM | POA: Diagnosis not present

## 2018-12-22 DIAGNOSIS — J449 Chronic obstructive pulmonary disease, unspecified: Secondary | ICD-10-CM | POA: Diagnosis not present

## 2018-12-23 DIAGNOSIS — J9602 Acute respiratory failure with hypercapnia: Secondary | ICD-10-CM | POA: Diagnosis not present

## 2018-12-23 DIAGNOSIS — J439 Emphysema, unspecified: Secondary | ICD-10-CM | POA: Diagnosis not present

## 2018-12-23 DIAGNOSIS — J9610 Chronic respiratory failure, unspecified whether with hypoxia or hypercapnia: Secondary | ICD-10-CM | POA: Diagnosis not present

## 2018-12-23 DIAGNOSIS — J9601 Acute respiratory failure with hypoxia: Secondary | ICD-10-CM | POA: Diagnosis not present

## 2018-12-23 DIAGNOSIS — J690 Pneumonitis due to inhalation of food and vomit: Secondary | ICD-10-CM | POA: Diagnosis not present

## 2018-12-24 ENCOUNTER — Other Ambulatory Visit (HOSPITAL_BASED_OUTPATIENT_CLINIC_OR_DEPARTMENT_OTHER): Payer: Self-pay

## 2018-12-24 ENCOUNTER — Other Ambulatory Visit: Payer: Self-pay | Admitting: *Deleted

## 2018-12-24 DIAGNOSIS — G4730 Sleep apnea, unspecified: Secondary | ICD-10-CM

## 2018-12-24 DIAGNOSIS — R0683 Snoring: Secondary | ICD-10-CM

## 2018-12-24 NOTE — Patient Outreach (Signed)
Outreach call to pt/ daughter for telephone assessment, no answer to telephone and no option to leave voicemail.  RN CM called [facility] home [facility] for collaboration, spoke with social worker Debbie Clapp(visited pt last week), and will be visiting pt again this week, Jackelyn Poling states she gave daughter [name] advanced directive forms to complete, Jackelyn Poling states pt is full code, continues to refuse hospice although she is appropriate, Jackelyn Poling feels daughter is guarded with health care providers. Home health RN continues to work with pt and ST to start this week.  PLAN Close case in 3 business days if no call back from daughter  Jacqlyn Larsen [facility], [place] Coordinator [phone]

## 2018-12-25 ENCOUNTER — Ambulatory Visit: Payer: Medicare Other | Admitting: *Deleted

## 2018-12-26 ENCOUNTER — Other Ambulatory Visit: Payer: Self-pay

## 2018-12-26 ENCOUNTER — Other Ambulatory Visit: Payer: Self-pay | Admitting: *Deleted

## 2018-12-26 ENCOUNTER — Emergency Department (HOSPITAL_COMMUNITY): Payer: Medicare Other

## 2018-12-26 ENCOUNTER — Inpatient Hospital Stay (HOSPITAL_COMMUNITY)
Admission: EM | Admit: 2018-12-26 | Discharge: 2019-01-02 | DRG: 871 | Disposition: A | Discharge status: Home Health | Payer: Medicare Other | Attending: Family Medicine | Admitting: Family Medicine

## 2018-12-26 ENCOUNTER — Encounter (HOSPITAL_COMMUNITY): Payer: Self-pay | Admitting: Emergency Medicine

## 2018-12-26 ENCOUNTER — Inpatient Hospital Stay (HOSPITAL_COMMUNITY): Payer: Medicare Other

## 2018-12-26 DIAGNOSIS — J471 Bronchiectasis with (acute) exacerbation: Secondary | ICD-10-CM | POA: Diagnosis not present

## 2018-12-26 DIAGNOSIS — J441 Chronic obstructive pulmonary disease with (acute) exacerbation: Secondary | ICD-10-CM | POA: Diagnosis not present

## 2018-12-26 DIAGNOSIS — Z853 Personal history of malignant neoplasm of breast: Secondary | ICD-10-CM

## 2018-12-26 DIAGNOSIS — A419 Sepsis, unspecified organism: Secondary | ICD-10-CM

## 2018-12-26 DIAGNOSIS — N390 Urinary tract infection, site not specified: Secondary | ICD-10-CM | POA: Diagnosis not present

## 2018-12-26 DIAGNOSIS — J9622 Acute and chronic respiratory failure with hypercapnia: Secondary | ICD-10-CM | POA: Diagnosis not present

## 2018-12-26 DIAGNOSIS — Z20828 Contact with and (suspected) exposure to other viral communicable diseases: Secondary | ICD-10-CM | POA: Diagnosis present

## 2018-12-26 DIAGNOSIS — E1165 Type 2 diabetes mellitus with hyperglycemia: Secondary | ICD-10-CM | POA: Diagnosis present

## 2018-12-26 DIAGNOSIS — W19XXXA Unspecified fall, initial encounter: Secondary | ICD-10-CM | POA: Diagnosis present

## 2018-12-26 DIAGNOSIS — J9621 Acute and chronic respiratory failure with hypoxia: Secondary | ICD-10-CM | POA: Diagnosis present

## 2018-12-26 DIAGNOSIS — R7989 Other specified abnormal findings of blood chemistry: Secondary | ICD-10-CM | POA: Diagnosis present

## 2018-12-26 DIAGNOSIS — F0281 Dementia in other diseases classified elsewhere with behavioral disturbance: Secondary | ICD-10-CM | POA: Diagnosis not present

## 2018-12-26 DIAGNOSIS — R0689 Other abnormalities of breathing: Secondary | ICD-10-CM | POA: Diagnosis present

## 2018-12-26 DIAGNOSIS — Z79899 Other long term (current) drug therapy: Secondary | ICD-10-CM

## 2018-12-26 DIAGNOSIS — M810 Age-related osteoporosis without current pathological fracture: Secondary | ICD-10-CM | POA: Diagnosis not present

## 2018-12-26 DIAGNOSIS — F0391 Unspecified dementia with behavioral disturbance: Secondary | ICD-10-CM | POA: Diagnosis present

## 2018-12-26 DIAGNOSIS — B9620 Unspecified Escherichia coli [E. coli] as the cause of diseases classified elsewhere: Secondary | ICD-10-CM | POA: Diagnosis not present

## 2018-12-26 DIAGNOSIS — R531 Weakness: Secondary | ICD-10-CM | POA: Diagnosis present

## 2018-12-26 DIAGNOSIS — C50919 Malignant neoplasm of unspecified site of unspecified female breast: Secondary | ICD-10-CM | POA: Diagnosis present

## 2018-12-26 DIAGNOSIS — I5033 Acute on chronic diastolic (congestive) heart failure: Secondary | ICD-10-CM | POA: Diagnosis present

## 2018-12-26 DIAGNOSIS — Z7901 Long term (current) use of anticoagulants: Secondary | ICD-10-CM

## 2018-12-26 DIAGNOSIS — I4892 Unspecified atrial flutter: Secondary | ICD-10-CM | POA: Diagnosis present

## 2018-12-26 DIAGNOSIS — I110 Hypertensive heart disease with heart failure: Secondary | ICD-10-CM | POA: Diagnosis not present

## 2018-12-26 DIAGNOSIS — Z1612 Extended spectrum beta lactamase (ESBL) resistance: Secondary | ICD-10-CM | POA: Diagnosis not present

## 2018-12-26 DIAGNOSIS — G4733 Obstructive sleep apnea (adult) (pediatric): Secondary | ICD-10-CM | POA: Diagnosis not present

## 2018-12-26 DIAGNOSIS — M7989 Other specified soft tissue disorders: Secondary | ICD-10-CM | POA: Diagnosis not present

## 2018-12-26 DIAGNOSIS — Z885 Allergy status to narcotic agent status: Secondary | ICD-10-CM

## 2018-12-26 DIAGNOSIS — Y92009 Unspecified place in unspecified non-institutional (private) residence as the place of occurrence of the external cause: Secondary | ICD-10-CM

## 2018-12-26 DIAGNOSIS — Z7189 Other specified counseling: Secondary | ICD-10-CM | POA: Diagnosis not present

## 2018-12-26 DIAGNOSIS — J811 Chronic pulmonary edema: Secondary | ICD-10-CM

## 2018-12-26 DIAGNOSIS — D649 Anemia, unspecified: Secondary | ICD-10-CM | POA: Diagnosis not present

## 2018-12-26 DIAGNOSIS — M25571 Pain in right ankle and joints of right foot: Secondary | ICD-10-CM | POA: Diagnosis present

## 2018-12-26 DIAGNOSIS — F05 Delirium due to known physiological condition: Secondary | ICD-10-CM | POA: Diagnosis present

## 2018-12-26 DIAGNOSIS — Z87891 Personal history of nicotine dependence: Secondary | ICD-10-CM

## 2018-12-26 DIAGNOSIS — J690 Pneumonitis due to inhalation of food and vomit: Secondary | ICD-10-CM | POA: Diagnosis not present

## 2018-12-26 DIAGNOSIS — Z87440 Personal history of urinary (tract) infections: Secondary | ICD-10-CM

## 2018-12-26 DIAGNOSIS — E559 Vitamin D deficiency, unspecified: Secondary | ICD-10-CM | POA: Diagnosis not present

## 2018-12-26 DIAGNOSIS — Z801 Family history of malignant neoplasm of trachea, bronchus and lung: Secondary | ICD-10-CM

## 2018-12-26 DIAGNOSIS — J9601 Acute respiratory failure with hypoxia: Secondary | ICD-10-CM | POA: Diagnosis not present

## 2018-12-26 DIAGNOSIS — J189 Pneumonia, unspecified organism: Secondary | ICD-10-CM | POA: Diagnosis not present

## 2018-12-26 DIAGNOSIS — J449 Chronic obstructive pulmonary disease, unspecified: Secondary | ICD-10-CM | POA: Diagnosis not present

## 2018-12-26 DIAGNOSIS — J810 Acute pulmonary edema: Secondary | ICD-10-CM | POA: Diagnosis not present

## 2018-12-26 DIAGNOSIS — J9611 Chronic respiratory failure with hypoxia: Secondary | ICD-10-CM | POA: Diagnosis present

## 2018-12-26 DIAGNOSIS — R52 Pain, unspecified: Secondary | ICD-10-CM | POA: Diagnosis not present

## 2018-12-26 DIAGNOSIS — E119 Type 2 diabetes mellitus without complications: Secondary | ICD-10-CM

## 2018-12-26 DIAGNOSIS — K5791 Diverticulosis of intestine, part unspecified, without perforation or abscess with bleeding: Secondary | ICD-10-CM | POA: Diagnosis not present

## 2018-12-26 DIAGNOSIS — R6520 Severe sepsis without septic shock: Secondary | ICD-10-CM | POA: Diagnosis present

## 2018-12-26 DIAGNOSIS — Z7984 Long term (current) use of oral hypoglycemic drugs: Secondary | ICD-10-CM

## 2018-12-26 DIAGNOSIS — F03918 Unspecified dementia, unspecified severity, with other behavioral disturbance: Secondary | ICD-10-CM | POA: Diagnosis present

## 2018-12-26 DIAGNOSIS — R0602 Shortness of breath: Secondary | ICD-10-CM | POA: Diagnosis not present

## 2018-12-26 DIAGNOSIS — Q000 Anencephaly: Secondary | ICD-10-CM | POA: Diagnosis not present

## 2018-12-26 DIAGNOSIS — E1169 Type 2 diabetes mellitus with other specified complication: Secondary | ICD-10-CM

## 2018-12-26 DIAGNOSIS — J9620 Acute and chronic respiratory failure, unspecified whether with hypoxia or hypercapnia: Secondary | ICD-10-CM | POA: Diagnosis present

## 2018-12-26 DIAGNOSIS — Z833 Family history of diabetes mellitus: Secondary | ICD-10-CM

## 2018-12-26 DIAGNOSIS — Z7952 Long term (current) use of systemic steroids: Secondary | ICD-10-CM

## 2018-12-26 DIAGNOSIS — J9602 Acute respiratory failure with hypercapnia: Secondary | ICD-10-CM | POA: Diagnosis not present

## 2018-12-26 DIAGNOSIS — D72829 Elevated white blood cell count, unspecified: Secondary | ICD-10-CM

## 2018-12-26 DIAGNOSIS — Z9981 Dependence on supplemental oxygen: Secondary | ICD-10-CM | POA: Diagnosis not present

## 2018-12-26 DIAGNOSIS — Z515 Encounter for palliative care: Secondary | ICD-10-CM | POA: Diagnosis not present

## 2018-12-26 DIAGNOSIS — Z9011 Acquired absence of right breast and nipple: Secondary | ICD-10-CM

## 2018-12-26 DIAGNOSIS — R404 Transient alteration of awareness: Secondary | ICD-10-CM | POA: Diagnosis not present

## 2018-12-26 LAB — CBC WITH DIFFERENTIAL/PLATELET
Abs Immature Granulocytes: 0.12 10*3/uL — ABNORMAL HIGH (ref 0.00–0.07)
Basophils Absolute: 0 10*3/uL (ref 0.0–0.1)
Basophils Relative: 0 %
Eosinophils Absolute: 0 10*3/uL (ref 0.0–0.5)
Eosinophils Relative: 0 %
HCT: 33.4 % — ABNORMAL LOW (ref 36.0–46.0)
Hemoglobin: 9 g/dL — ABNORMAL LOW (ref 12.0–15.0)
Immature Granulocytes: 1 %
Lymphocytes Relative: 9 %
Lymphs Abs: 1.1 10*3/uL (ref 0.7–4.0)
MCH: 26.9 pg (ref 26.0–34.0)
MCHC: 26.9 g/dL — ABNORMAL LOW (ref 30.0–36.0)
MCV: 99.7 fL (ref 80.0–100.0)
Monocytes Absolute: 0.9 10*3/uL (ref 0.1–1.0)
Monocytes Relative: 7 %
Neutro Abs: 10.4 10*3/uL — ABNORMAL HIGH (ref 1.7–7.7)
Neutrophils Relative %: 83 %
Platelets: 318 10*3/uL (ref 150–400)
RBC: 3.35 MIL/uL — ABNORMAL LOW (ref 3.87–5.11)
RDW: 15.3 % (ref 11.5–15.5)
WBC: 12.6 10*3/uL — ABNORMAL HIGH (ref 4.0–10.5)
nRBC: 0.3 % — ABNORMAL HIGH (ref 0.0–0.2)

## 2018-12-26 LAB — URINALYSIS, ROUTINE W REFLEX MICROSCOPIC
Bilirubin Urine: NEGATIVE
Glucose, UA: >=500 mg/dL — AB
Hgb urine dipstick: NEGATIVE
Ketones, ur: 5 mg/dL — AB
Leukocytes,Ua: NEGATIVE
Nitrite: POSITIVE — AB
Protein, ur: 30 mg/dL — AB
Specific Gravity, Urine: 1.015 (ref 1.005–1.030)
pH: 5 (ref 5.0–8.0)

## 2018-12-26 LAB — BLOOD GAS, ARTERIAL
Acid-Base Excess: 19 mmol/L — ABNORMAL HIGH (ref 0.0–2.0)
Bicarbonate: 41.7 mmol/L — ABNORMAL HIGH (ref 20.0–28.0)
FIO2: 44
O2 Saturation: 87.3 %
Patient temperature: 37
pCO2 arterial: 81.8 mmHg (ref 32.0–48.0)
pH, Arterial: 7.363 (ref 7.350–7.450)
pO2, Arterial: 55.2 mmHg — ABNORMAL LOW (ref 83.0–108.0)

## 2018-12-26 LAB — COMPREHENSIVE METABOLIC PANEL
ALT: 15 U/L (ref 0–44)
AST: 17 U/L (ref 15–41)
Albumin: 3.3 g/dL — ABNORMAL LOW (ref 3.5–5.0)
Alkaline Phosphatase: 80 U/L (ref 38–126)
Anion gap: 9 (ref 5–15)
BUN: 8 mg/dL (ref 8–23)
CO2: 45 mmol/L — ABNORMAL HIGH (ref 22–32)
Calcium: 9.8 mg/dL (ref 8.9–10.3)
Chloride: 87 mmol/L — ABNORMAL LOW (ref 98–111)
Creatinine, Ser: 0.52 mg/dL (ref 0.44–1.00)
GFR calc Af Amer: >60 mL/min (ref >60)
GFR calc non Af Amer: >60 mL/min (ref >60)
Glucose, Bld: 225 mg/dL — ABNORMAL HIGH (ref 70–99)
Potassium: 4.3 mmol/L (ref 3.5–5.1)
Sodium: 141 mmol/L (ref 135–145)
Total Bilirubin: 0.6 mg/dL (ref 0.3–1.2)
Total Protein: 6.5 g/dL (ref 6.5–8.1)

## 2018-12-26 LAB — PROTIME-INR
INR: 1 (ref 0.8–1.2)
Prothrombin Time: 13.2 seconds (ref 11.4–15.2)

## 2018-12-26 LAB — AMMONIA: Ammonia: 24 umol/L (ref 9–35)

## 2018-12-26 LAB — APTT: aPTT: 29 seconds (ref 24–36)

## 2018-12-26 LAB — BRAIN NATRIURETIC PEPTIDE: B Natriuretic Peptide: 339 pg/mL — ABNORMAL HIGH (ref 0.0–100.0)

## 2018-12-26 LAB — GLUCOSE, CAPILLARY: Glucose-Capillary: 251 mg/dL — ABNORMAL HIGH (ref 70–99)

## 2018-12-26 LAB — TROPONIN I (HIGH SENSITIVITY): Troponin I (High Sensitivity): 10 ng/L (ref <18)

## 2018-12-26 LAB — SARS CORONAVIRUS 2 BY RT PCR (HOSPITAL ORDER, PERFORMED IN ~~LOC~~ HOSPITAL LAB): SARS Coronavirus 2: NEGATIVE

## 2018-12-26 LAB — LACTIC ACID, PLASMA
Lactic Acid, Venous: 2 mmol/L (ref 0.5–1.9)
Lactic Acid, Venous: 1.7 mmol/L (ref 0.5–1.9)

## 2018-12-26 MED ORDER — CHLORHEXIDINE GLUCONATE CLOTH 2 % EX PADS
6.0000 | MEDICATED_PAD | Freq: Every day | CUTANEOUS | Status: DC
  Administered 2018-12-26 – 2019-01-02 (×7): 6 via TOPICAL

## 2018-12-26 MED ORDER — SODIUM CHLORIDE 0.9 % IV SOLN
2.0000 g | INTRAVENOUS | Status: DC
  Administered 2018-12-26: 2 g via INTRAVENOUS
  Filled 2018-12-26: qty 20

## 2018-12-26 MED ORDER — SENNOSIDES-DOCUSATE SODIUM 8.6-50 MG PO TABS
1.0000 | ORAL_TABLET | Freq: Every evening | ORAL | Status: DC | PRN
  Administered 2018-12-31: 1 via ORAL
  Filled 2018-12-26: qty 1

## 2018-12-26 MED ORDER — BUDESONIDE 0.25 MG/2ML IN SUSP
0.2500 mg | Freq: Two times a day (BID) | RESPIRATORY_TRACT | Status: DC
  Administered 2018-12-26 – 2019-01-02 (×14): 0.25 mg via RESPIRATORY_TRACT
  Filled 2018-12-26 (×14): qty 2

## 2018-12-26 MED ORDER — RIVAROXABAN 20 MG PO TABS
20.0000 mg | ORAL_TABLET | Freq: Every day | ORAL | Status: DC
  Administered 2018-12-27 – 2019-01-01 (×6): 20 mg via ORAL
  Filled 2018-12-26 (×6): qty 1

## 2018-12-26 MED ORDER — PANTOPRAZOLE SODIUM 40 MG IV SOLR
40.0000 mg | INTRAVENOUS | Status: DC
  Administered 2018-12-26 – 2018-12-30 (×4): 40 mg via INTRAVENOUS
  Filled 2018-12-26 (×5): qty 40

## 2018-12-26 MED ORDER — ONDANSETRON HCL 4 MG PO TABS: 4.0000 mg | ORAL_TABLET | Freq: Four times a day (QID) | ORAL | Status: DC | PRN

## 2018-12-26 MED ORDER — ALBUTEROL SULFATE HFA 108 (90 BASE) MCG/ACT IN AERS
2.0000 | INHALATION_SPRAY | RESPIRATORY_TRACT | Status: AC
  Administered 2018-12-26: 2 via RESPIRATORY_TRACT
  Filled 2018-12-26: qty 6.7

## 2018-12-26 MED ORDER — METHYLPREDNISOLONE SODIUM SUCC 125 MG IJ SOLR
125.0000 mg | Freq: Once | INTRAMUSCULAR | Status: AC
  Administered 2018-12-26: 11:00:00 125 mg via INTRAVENOUS
  Filled 2018-12-26: qty 2

## 2018-12-26 MED ORDER — QUETIAPINE FUMARATE 25 MG PO TABS
25.0000 mg | ORAL_TABLET | Freq: Every day | ORAL | Status: DC
  Administered 2018-12-27 – 2019-01-01 (×6): 25 mg via ORAL
  Filled 2018-12-26 (×6): qty 1

## 2018-12-26 MED ORDER — INSULIN ASPART 100 UNIT/ML ~~LOC~~ SOLN
0.0000 [IU] | SUBCUTANEOUS | Status: DC
  Administered 2018-12-26 – 2018-12-27 (×3): 11 [IU] via SUBCUTANEOUS
  Administered 2018-12-27 – 2018-12-28 (×2): 7 [IU] via SUBCUTANEOUS
  Administered 2018-12-28: 11 [IU] via SUBCUTANEOUS
  Administered 2018-12-29: 4 [IU] via SUBCUTANEOUS
  Administered 2018-12-29: 7 [IU] via SUBCUTANEOUS
  Administered 2018-12-29 – 2018-12-30 (×2): 15 [IU] via SUBCUTANEOUS
  Administered 2018-12-30: 4 [IU] via SUBCUTANEOUS
  Administered 2018-12-30: 11 [IU] via SUBCUTANEOUS
  Administered 2018-12-30: 4 [IU] via SUBCUTANEOUS

## 2018-12-26 MED ORDER — ONDANSETRON HCL 4 MG/2ML IJ SOLN
4.0000 mg | Freq: Once | INTRAMUSCULAR | Status: AC | PRN
  Administered 2018-12-26: 11:00:00 4 mg via INTRAVENOUS
  Filled 2018-12-26: qty 2

## 2018-12-26 MED ORDER — IPRATROPIUM-ALBUTEROL 0.5-2.5 (3) MG/3ML IN SOLN
3.0000 mL | RESPIRATORY_TRACT | Status: DC
  Administered 2018-12-26 – 2018-12-28 (×11): 3 mL via RESPIRATORY_TRACT
  Filled 2018-12-26 (×11): qty 3

## 2018-12-26 MED ORDER — ONDANSETRON HCL 4 MG/2ML IJ SOLN: 4.0000 mg | Freq: Four times a day (QID) | INTRAMUSCULAR | Status: DC | PRN

## 2018-12-26 MED ORDER — ALBUTEROL (5 MG/ML) CONTINUOUS INHALATION SOLN
10.0000 mg/h | INHALATION_SOLUTION | RESPIRATORY_TRACT | Status: AC
  Administered 2018-12-26: 10 mg/h via RESPIRATORY_TRACT
  Filled 2018-12-26: qty 20

## 2018-12-26 MED ORDER — ACETAMINOPHEN 325 MG PO TABS
650.0000 mg | ORAL_TABLET | Freq: Four times a day (QID) | ORAL | Status: DC | PRN
  Administered 2018-12-27 – 2019-01-02 (×5): 650 mg via ORAL
  Filled 2018-12-26 (×5): qty 2

## 2018-12-26 MED ORDER — FUROSEMIDE 10 MG/ML IJ SOLN
20.0000 mg | Freq: Two times a day (BID) | INTRAMUSCULAR | Status: DC
  Administered 2018-12-26: 20 mg via INTRAVENOUS
  Filled 2018-12-26: qty 2

## 2018-12-26 MED ORDER — ACETAMINOPHEN 650 MG RE SUPP: 650.0000 mg | Freq: Four times a day (QID) | RECTAL | Status: DC | PRN

## 2018-12-26 MED ORDER — INSULIN GLARGINE 100 UNIT/ML ~~LOC~~ SOLN
18.0000 [IU] | SUBCUTANEOUS | Status: DC
  Administered 2018-12-26: 18 [IU] via SUBCUTANEOUS
  Filled 2018-12-26: qty 0.18

## 2018-12-26 MED ORDER — SODIUM CHLORIDE 0.9 % IV SOLN
INTRAVENOUS | Status: DC
  Administered 2018-12-26: 11:00:00 via INTRAVENOUS

## 2018-12-26 MED ORDER — MAGNESIUM SULFATE 2 GM/50ML IV SOLN
2.0000 g | Freq: Once | INTRAVENOUS | Status: AC
  Administered 2018-12-26: 2 g via INTRAVENOUS
  Filled 2018-12-26: qty 50

## 2018-12-26 MED ORDER — HALOPERIDOL LACTATE 5 MG/ML IJ SOLN
5.0000 mg | Freq: Four times a day (QID) | INTRAMUSCULAR | Status: DC | PRN
  Administered 2018-12-30 – 2018-12-31 (×2): 5 mg via INTRAVENOUS
  Filled 2018-12-26 (×2): qty 1

## 2018-12-26 MED ORDER — METHYLPREDNISOLONE SODIUM SUCC 125 MG IJ SOLR
60.0000 mg | Freq: Four times a day (QID) | INTRAMUSCULAR | Status: DC
  Administered 2018-12-26 – 2018-12-30 (×13): 60 mg via INTRAVENOUS
  Filled 2018-12-26 (×14): qty 2

## 2018-12-26 MED ORDER — DILTIAZEM HCL ER COATED BEADS 180 MG PO CP24
180.0000 mg | ORAL_CAPSULE | Freq: Every day | ORAL | Status: DC
  Administered 2018-12-27 – 2019-01-02 (×7): 180 mg via ORAL
  Filled 2018-12-26 (×7): qty 1

## 2018-12-26 MED ORDER — SODIUM CHLORIDE 0.9 % IV SOLN
500.0000 mg | INTRAVENOUS | Status: DC
  Administered 2018-12-26: 500 mg via INTRAVENOUS
  Filled 2018-12-26: qty 500

## 2018-12-26 MED ORDER — INSULIN ASPART 100 UNIT/ML ~~LOC~~ SOLN: 6.0000 [IU] | Freq: Three times a day (TID) | SUBCUTANEOUS | Status: DC

## 2018-12-26 MED ORDER — METOPROLOL TARTRATE 5 MG/5ML IV SOLN
2.5000 mg | Freq: Four times a day (QID) | INTRAVENOUS | Status: DC
  Administered 2018-12-26 – 2018-12-28 (×7): 2.5 mg via INTRAVENOUS
  Filled 2018-12-26 (×6): qty 5

## 2018-12-26 MED ORDER — SODIUM CHLORIDE 0.9 % IV SOLN
INTRAVENOUS | Status: DC
  Administered 2018-12-26: 23:00:00 via INTRAVENOUS

## 2018-12-26 NOTE — Patient Outreach (Signed)
Case closed due to unable to maintain contact, home health continues to follow pt.  RN CM mailed case closure letter to patient's home, faxed case closure letter to primary MD.  .Jacqlyn Larsen [facility], BSN [place] Coordinator [phone]

## 2018-12-26 NOTE — ED Notes (Signed)
ED TO INPATIENT HANDOFF REPORT  ED Nurse Name and Phone #: [phone]  S Name/Age/Gender Eileen [name] 73 y.o. female Room/Bed: APA12/APA12  Code Status   Code Status: Prior  Home/SNF/Other Home Patient oriented to: self Is this baseline? No   Triage Complete: Triage complete  Chief Complaint weakness  Triage Note PT brought in by [facility] today from home with daughter with complaints of generalized weakenss, worsening SOB with exertion and a fall yesterday with left ankle pain.    Allergies Allergies  Allergen Reactions  . Codeine Other (See Comments)    "jittery"    Level of Care/Admitting Diagnosis ED Disposition    ED Disposition Condition [place]: [facility] [id number]  Level of Care: Stepdown [14]  Covid Evaluation: Confirmed COVID Negative  Diagnosis: Acute and chronic respiratory failure with hypercapnia [facility]) [id number]  Admitting Physician: Murlean Iba [[id number]]  Attending Physician: Murlean Iba [[id number]]  Estimated length of stay: past midnight tomorrow  Certification:: I certify this patient will need inpatient services for at least 2 midnights  PT Class (Do Not Modify): Inpatient [101]  PT Acc Code (Do Not Modify): Private [1]       B Medical/Surgery History Past Medical History:  Diagnosis Date  . Asthma   . Atrial flutter ([place])   . Cancer [facility])    Right breast  . COPD (chronic obstructive pulmonary disease) ([place])   . Dementia ([place])   . Diverticulosis   . Hemorrhoids   . History of breast cancer    right breast  . Hypertension   . On home O2   . Type 2 diabetes mellitus ([place])    Past Surgical History:  Procedure Laterality Date  . BREAST SURGERY  2011   Right breast mastectomy  . CARDIOVERSION N/A 06/27/2018   Procedure: CARDIOVERSION;  Surgeon: Arnoldo Lenis, MD;  [facility];  Service: Endoscopy;  Laterality: N/A;  . CESAREAN SECTION    . COLONOSCOPY N/A 01/22/2018    Procedure: COLONOSCOPY;  Surgeon: Rogene Houston, MD;  [facility];  Service: Endoscopy;  Laterality: N/A;  . HERNIA REPAIR     RIH  . IR VERTEBROPLASTY LUMBAR BX INC UNI/BIL INC/INJECT/IMAGING  09/05/2018  . MASTECTOMY  2011   right breast  . TEE WITHOUT CARDIOVERSION N/A 06/27/2018   Procedure: TRANSESOPHAGEAL ECHOCARDIOGRAM (TEE) WITH PROPOFOL;  Surgeon: Arnoldo Lenis, MD;  Location[facility];  Service: Endoscopy;  Laterality: N/A;     A IV Location/Drains/Wounds Patient Lines/Drains/Airways Status   Active Line/Drains/Airways    Name:   Placement date:   Placement time:   Site:   Days:   Peripheral IV 12/26/18 Left Antecubital   12/26/18    1056    Antecubital   less than 1          Intake/Output Last 24 hours  Intake/Output Summary (Last 24 hours) at 12/26/2018 1743 Last data filed at 12/26/2018 1426 Gross per 24 hour  Intake 404.19 ml  Output -  Net 404.19 ml    Labs/Imaging Results for orders placed or performed during the hospital encounter of 12/26/18 (from the past 48 hour(s))  Blood gas, arterial     Status: Abnormal   Collection Time: 12/26/18 10:41 AM  Result Value Ref Range   FIO2 44.00    pH, Arterial 7.363 7.350 - 7.450   pCO2 arterial 81.8 (HH) 32.0 - 48.0 mmHg    Comment: CRITICAL RESULT CALLED  TO, READ BACK BY AND VERIFIED WITH: Jaisen Wiltrout,H AT 11:25AM ON 12/26/18 BY FESTERMAN,C    pO2, Arterial 55.2 (L) 83.0 - 108.0 mmHg   Bicarbonate 41.7 (H) 20.0 - 28.0 mmol/L   Acid-Base Excess 19.0 (H) 0.0 - 2.0 mmol/L   O2 Saturation 87.3 %   Patient temperature 37.0    Allens test (pass/fail) PASS PASS    Comment: Performed at [facility], [address] [place], [place] [address]  CBC with Differential/Platelet     Status: Abnormal   Collection Time: 12/26/18 10:41 AM  Result Value Ref Range   WBC 12.6 (H) 4.0 - 10.5 K/uL   RBC 3.35 (L) 3.87 - 5.11 MIL/uL   Hemoglobin 9.0 (L) 12.0 - 15.0 g/dL   HCT 33.4 (L) 36.0 - 46.0 %   MCV  99.7 80.0 - 100.0 fL   MCH 26.9 26.0 - 34.0 pg   MCHC 26.9 (L) 30.0 - 36.0 g/dL   RDW 15.3 11.5 - 15.5 %   Platelets 318 150 - 400 K/uL   nRBC 0.3 (H) 0.0 - 0.2 %   Neutrophils Relative % 83 %   Neutro Abs 10.4 (H) 1.7 - 7.7 K/uL   Lymphocytes Relative 9 %   Lymphs Abs 1.1 0.7 - 4.0 K/uL   Monocytes Relative 7 %   Monocytes Absolute 0.9 0.1 - 1.0 K/uL   Eosinophils Relative 0 %   Eosinophils Absolute 0.0 0.0 - 0.5 K/uL   Basophils Relative 0 %   Basophils Absolute 0.0 0.0 - 0.1 K/uL   Immature Granulocytes 1 %   Abs Immature Granulocytes 0.12 (H) 0.00 - 0.07 K/uL    Comment: Performed at [facility], [address] [place], [place] [address]  Comprehensive metabolic panel     Status: Abnormal   Collection Time: 12/26/18 10:41 AM  Result Value Ref Range   Sodium 141 135 - 145 mmol/L   Potassium 4.3 3.5 - 5.1 mmol/L   Chloride 87 (L) 98 - 111 mmol/L   CO2 45 (H) 22 - 32 mmol/L   Glucose, Bld 225 (H) 70 - 99 mg/dL   BUN 8 8 - 23 mg/dL   Creatinine, Ser 0.52 0.44 - 1.00 mg/dL   Calcium 9.8 8.9 - 10.3 mg/dL   Total Protein 6.5 6.5 - 8.1 g/dL   Albumin 3.3 (L) 3.5 - 5.0 g/dL   AST 17 15 - 41 U/L   ALT 15 0 - 44 U/L   Alkaline Phosphatase 80 38 - 126 U/L   Total Bilirubin 0.6 0.3 - 1.2 mg/dL   GFR calc non Af Amer >60 >60 mL/min   GFR calc Af Amer >60 >60 mL/min   Anion gap 9 5 - 15    Comment: Performed at [facility], [address], [place], [place] [address]  Troponin I (High Sensitivity)     Status: None   Collection Time: 12/26/18 10:41 AM  Result Value Ref Range   Troponin I (High Sensitivity) 10 <18 ng/L    Comment: (NOTE) Elevated high sensitivity troponin I (hsTnI) values and significant  changes across serial measurements may suggest ACS but many other  chronic and acute conditions are known to elevate hsTnI results.  Refer to the "Links" section for chest pain algorithms and additional  guidance. Performed at [facility], [address] [place], [place]  [address]   Urinalysis, Routine w reflex microscopic     Status: Abnormal   Collection Time: 12/26/18 10:42 AM  Result Value Ref Range  Color, Urine YELLOW YELLOW   APPearance HAZY (A) CLEAR   Specific Gravity, Urine 1.015 1.005 - 1.030   pH 5.0 5.0 - 8.0   Glucose, UA >=500 (A) NEGATIVE mg/dL   Hgb urine dipstick NEGATIVE NEGATIVE   Bilirubin Urine NEGATIVE NEGATIVE   Ketones, ur 5 (A) NEGATIVE mg/dL   Protein, ur 30 (A) NEGATIVE mg/dL   Nitrite POSITIVE (A) NEGATIVE   Leukocytes,Ua NEGATIVE NEGATIVE   WBC, UA 0-5 0 - 5 WBC/hpf   Bacteria, UA RARE (A) NONE SEEN   Squamous Epithelial / LPF 0-5 0 - 5   Mucus PRESENT    Hyaline Casts, UA PRESENT     Comment: Performed at [facility], [address] [place], [place] [address]  Ammonia     Status: None   Collection Time: 12/26/18 10:43 AM  Result Value Ref Range   Ammonia 24 9 - 35 umol/L    Comment: Performed at [facility], [address], [place], [place] [address]  Brain natriuretic peptide     Status: Abnormal   Collection Time: 12/26/18 10:44 AM  Result Value Ref Range   B Natriuretic Peptide 339.0 (H) 0.0 - 100.0 pg/mL    Comment: Performed at [facility], [address], [place], [place] [address]  SARS Coronavirus 2 by RT PCR (hospital order, performed in [place] hospital lab) Nasopharyngeal Nasopharyngeal Swab     Status: None   Collection Time: 12/26/18 10:46 AM   Specimen: Nasopharyngeal Swab  Result Value Ref Range   SARS Coronavirus 2 NEGATIVE NEGATIVE    Comment: (NOTE) If result is NEGATIVE SARS-CoV-2 target nucleic acids are NOT DETECTED. The SARS-CoV-2 RNA is generally detectable in upper and lower  respiratory specimens during the acute phase of infection. The lowest  concentration of SARS-CoV-2 viral copies this assay can detect is 250  copies / mL. A negative result does not preclude SARS-CoV-2 infection  and should not be used as the sole basis for treatment or other  patient management decisions.  A  negative result may occur with  improper specimen collection / handling, submission of specimen other  than nasopharyngeal swab, presence of viral mutation(s) within the  areas targeted by this assay, and inadequate number of viral copies  (<250 copies / mL). A negative result must be combined with clinical  observations, patient history, and epidemiological information. If result is POSITIVE SARS-CoV-2 target nucleic acids are DETECTED. The SARS-CoV-2 RNA is generally detectable in upper and lower  respiratory specimens dur ing the acute phase of infection.  Positive  results are indicative of active infection with SARS-CoV-2.  Clinical  correlation with patient history and other diagnostic information is  necessary to determine patient infection status.  Positive results do  not rule out bacterial infection or co-infection with other viruses. If result is PRESUMPTIVE POSTIVE SARS-CoV-2 nucleic acids MAY BE PRESENT.   A presumptive positive result was obtained on the submitted specimen  and confirmed on repeat testing.  While 2019 novel coronavirus  (SARS-CoV-2) nucleic acids may be present in the submitted sample  additional confirmatory testing may be necessary for epidemiological  and / or clinical management purposes  to differentiate between  SARS-CoV-2 and other Sarbecovirus currently known to infect humans.  If clinically indicated additional testing with an alternate test  methodology [phone]) is advised. The SARS-CoV-2 RNA is generally  detectable in upper and lower respiratory sp ecimens during the acute  phase of infection. The expected result is Negative. Fact Sheet for  Patients:  StrictlyIdeas.no Fact Sheet for Healthcare Providers: BankingDealers.co.za This test is not yet approved or cleared by the [place] FDA and has been authorized for detection and/or diagnosis of SARS-CoV-2 by FDA under an Emergency Use  Authorization (EUA).  This EUA will remain in effect (meaning this test can be used) for the duration of the COVID-19 declaration under Section 564(b)(1) of the Act, 21 U.S.C. section 360bbb-3(b)(1), unless the authorization is terminated or revoked sooner. Performed at [facility], [address] [place], [place] [address]   Lactic acid, plasma     Status: Abnormal   Collection Time: 12/26/18 12:12 PM  Result Value Ref Range   Lactic Acid, Venous 2.0 (HH) 0.5 - 1.9 mmol/L    Comment: CRITICAL RESULT CALLED TO, READ BACK BY AND VERIFIED WITH: CARWFORD,H AT 1318 ON 10.15.20 BY ISLEY,B Performed at [facility], [address] [place], [place] [address]   APTT     Status: None   Collection Time: 12/26/18 12:12 PM  Result Value Ref Range   aPTT 29 24 - 36 seconds    Comment: Performed at [facility], [address], [place], [place] [address]  Protime-INR     Status: None   Collection Time: 12/26/18 12:12 PM  Result Value Ref Range   Prothrombin Time 13.2 11.4 - 15.2 seconds   INR 1.0 0.8 - 1.2    Comment: (NOTE) INR goal varies based on device and disease states. Performed at [facility], [address], [place], [place] [address]   Blood Culture (routine x 2)     Status: None (Preliminary result)   Collection Time: 12/26/18 12:12 PM   Specimen: BLOOD LEFT HAND  Result Value Ref Range   Specimen Description BLOOD LEFT HAND    Special Requests      BOTTLES DRAWN AEROBIC AND ANAEROBIC Blood Culture results may not be optimal due to an inadequate volume of blood received in culture bottles Performed at [facility], [address] [place], [place] [address]    Culture PENDING    Report Status PENDING   Blood Culture (routine x 2)     Status: None (Preliminary result)   Collection Time: 12/26/18 12:12 PM   Specimen: BLOOD LEFT HAND  Result Value Ref Range   Specimen Description BLOOD LEFT HAND    Special Requests      BOTTLES DRAWN AEROBIC AND ANAEROBIC Blood Culture results  may not be optimal due to an inadequate volume of blood received in culture bottles Performed at [facility], [address] [place], [place] [address]    Culture PENDING    Report Status PENDING   Lactic acid, plasma     Status: None   Collection Time: 12/26/18  2:04 PM  Result Value Ref Range   Lactic Acid, Venous 1.7 0.5 - 1.9 mmol/L    Comment: Performed at [facility], [address], [place],  [address]   Dg Ankle 2 Views Right  Result Date: 12/26/2018 CLINICAL DATA:  Right ankle pain. EXAM: RIGHT ANKLE - 2 VIEW COMPARISON:  No recent. FINDINGS: Diffuse soft tissue swelling. No acute bony abnormality identified. No evidence of fracture. Corticated bony densities noted adjacent to the mediolateral malleoli. These consistent old fracture fragments. IMPRESSION: 1.  Diffuse soft tissue swelling.  No acute bony abnormality. 2. Corticated bony densities noted adjacent to the medial and lateral malleoli consistent with old fracture fragments. Electronically Signed   By: [place]   On: 12/26/2018 14:11  Dg Chest Port 1 View  Result Date: 12/26/2018 CLINICAL DATA:  Generalized weakness and worsening shortness of breath with exertion. Status post fall yesterday. EXAM: PORTABLE CHEST 1 VIEW COMPARISON:  Single-view of the chest 12/08/2018 and 11/19/2018. FINDINGS: There is cardiomegaly and pulmonary edema with small bilateral pleural effusions. Right basilar airspace disease is seen. No pneumothorax. No acute bony abnormality. IMPRESSION: Pulmonary edema with associated small effusions, larger on the right. Right basilar airspace disease could be atelectasis or pneumonia. Electronically Signed   By: Inge Rise M.D.   On: 12/26/2018 11:32    Pending Labs Unresulted Labs (From admission, onward)    Start     Ordered   12/26/18 1043  Urine Culture  ONCE - STAT,   STAT     12/26/18 1046   Signed and Held  SARS CORONAVIRUS 2 (TAT 6-24 HRS) Nasopharyngeal Nasopharyngeal Swab   (COVID Labs)  Once,   R    Question Answer Comment  Is this test for diagnosis or screening Screening   Symptomatic for COVID-19 as defined by CDC No   Hospitalized for COVID-19 No   Admitted to ICU for COVID-19 No   Previously tested for COVID-19 Yes   Resident in a congregate (group) care setting No   Employed in healthcare setting No   Pregnant No   Pre-procedural testing Yes      Signed and Held   Signed and Held  Comprehensive metabolic panel  Daily,   R     Signed and Held   Signed and Held  Magnesium  Daily,   R     Signed and Held   Signed and Held  CBC WITH DIFFERENTIAL  Daily,   R     Signed and Held   Signed and Held  Brain natriuretic peptide  Daily,   R     Signed and Held          Vitals/Pain Today's Vitals   12/26/18 1600 12/26/18 1630 12/26/18 1700 12/26/18 1730  BP: 118/63 113/60 115/64 115/64  Pulse: (!) 101 (!) 101 (!) 101 100  Resp: (!) 36 (!) 27 (!) 23 (!) 31  Temp:      TempSrc:      SpO2: 96% 94% 94% 94%  Weight:      Height:      PainSc:        Isolation Precautions No active isolations  Medications Medications  0.9 %  sodium chloride infusion ( Intravenous New Bag/Given 12/26/18 1110)  cefTRIAXone (ROCEPHIN) 2 g in sodium chloride 0.9 % 100 mL IVPB (0 g Intravenous Stopped 12/26/18 1259)  azithromycin (ZITHROMAX) 500 mg in sodium chloride 0.9 % 250 mL IVPB (0 mg Intravenous Stopped 12/26/18 1426)  albuterol (PROVENTIL,VENTOLIN) solution continuous neb (10 mg/hr Nebulization New Bag/Given 12/26/18 1323)  magnesium sulfate IVPB 2 g 50 mL (0 g Intravenous Stopped 12/26/18 1219)  methylPREDNISolone sodium succinate (SOLU-MEDROL) 125 mg/2 mL injection 125 mg (125 mg Intravenous Given 12/26/18 1111)  ondansetron (ZOFRAN) injection 4 mg (4 mg Intravenous Given 12/26/18 1111)  albuterol (VENTOLIN HFA) 108 (90 Base) MCG/ACT inhaler 2 puff (2 puffs Inhalation Given 12/26/18 1106)    Mobility walks High fall risk   Focused  Assessments    R Recommendations: See Admitting Provider Note  Report given to:   Additional Notes:

## 2018-12-26 NOTE — ED Triage Notes (Signed)
PT brought in by [facility] today from home with daughter with complaints of generalized weakenss, worsening SOB with exertion and a fall yesterday with left ankle pain.

## 2018-12-26 NOTE — ED Notes (Signed)
Hospitalist at bedside 

## 2018-12-26 NOTE — ED Notes (Signed)
Date and time results received: 12/26/18 1124 (use smartphrase ".now" to insert current time)  Test: PCO2 Critical Value: 81.8 Name of Provider Notified: Dr Sabra Heck Orders Received? Or Actions Taken?: NA

## 2018-12-26 NOTE — ED Notes (Signed)
Respiratory called and informed of order for Bipap and neb treatment.

## 2018-12-26 NOTE — ED Provider Notes (Signed)
[facility] EMERGENCY DEPARTMENT Provider Note   CSN: [id number] Arrival date & time: 12/26/18  1024     History   Chief Complaint Chief Complaint  Patient presents with   Weakness    HPI Eileen [name] is a 73 y.o. female.     This patient is an ill-appearing 73 year old female with a known history of atrial flutter on Eliquis, history of COPD with frequent admissions to the hospital.  She has a known history of metabolic encephalopathy and acute on chronic respiratory failure.  She presents to the hospital with increasing shortness of breath and altered mental status found to have an oxygen level around 80% by the paramedics on their arrival.  According to the paramedics the patient had a oxygen concentrator with a 50 foot tubing, they were unclear whether she was actually getting it.  The patient has dementia, level 5 caveat applies as the patient is not able to answer any questions secondary to both dementia and altered mental status.   Weakness   Past Medical History:  Diagnosis Date   Asthma    Atrial flutter ([place])    Cancer ([place])    Right breast   COPD (chronic obstructive pulmonary disease) ([place])    Dementia ([place])    Diverticulosis    Hemorrhoids    History of breast cancer    right breast   Hypertension    On home O2    Type 2 diabetes mellitus ([place])     Patient Active Problem List   Diagnosis Date Noted   Acute and chronic respiratory failure with hypercapnia ([place]) 12/26/2018   On home O2    CO2 narcosis    Orthostatic hypotension    Hematochezia 12/06/2018   Rectal bleeding 12/05/2018   Aspiration pneumonia ([place]) 11/19/2018   Dysphagia 11/19/2018   Acute respiratory failure with hypoxia and hypercapnia (HCC) 11/16/2018   Chronic obstructive bronchitis (HCC)    Dementia with behavioral disturbance ([place])    Goals of care, counseling/discussion    Advanced care planning/counseling discussion    Palliative care by  specialist    Bronchiectasis with acute exacerbation ([place])    COPD exacerbation ([place]) 10/18/2018   Anemia 10/18/2018   Osteoporosis 08/30/2018   Closed compression fracture of body of L1 vertebra (HCC)    Constipation    Acute and chronic respiratory failure (acute-on-chronic) ([place]) 08/10/2018   Left lower lobe pneumonia 08/10/2018   Leukocytosis 08/10/2018   Chronic respiratory failure with hypoxia ([place]) 06/28/2018   Acute upper GI bleeding 01/20/2018   Lower GI bleed 01/20/2018   Acute respiratory failure with hypoxia ([place]) 09/17/2017   Diabetes mellitus ([place]) 08/31/2017   Acute hypoxemic respiratory failure ([place]) 08/31/2017   Hemorrhoids 10/30/2016   Atrial fibrillation with RVR ([place]) 09/24/2016   Atrial flutter ([place]) 09/23/2016   Obstructive chronic bronchitis with exacerbation ([place]) 05/23/2016   Vitamin D deficiency 05/23/2016   Acute on chronic respiratory failure ([place]) Q000111Q   Metabolic encephalopathy Q000111Q   HCAP (healthcare-associated pneumonia) 03/24/2015   Anticoagulation management encounter    New onset atrial flutter ([place]) 03/21/2015   Obesity 03/21/2015   Atrial flutter with rapid ventricular response ([place]) 03/21/2015   COPD (chronic obstructive pulmonary disease) ([place]) 12/20/2014   COPD with acute exacerbation ([place]) 12/20/2014   DCIS (ductal carcinoma in situ) of breast, right, 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, Arimadex 03/10/2011   Breast cancer ([place]) 10/11/2009    Past Surgical History:  Procedure Laterality Date   BREAST SURGERY  2011  Right breast mastectomy   CARDIOVERSION N/A 06/27/2018   Procedure: CARDIOVERSION;  Surgeon: Arnoldo Lenis, MD;  Location: [facility];  Service: Endoscopy;  Laterality: N/A;   CESAREAN SECTION     COLONOSCOPY N/A 01/22/2018   Procedure: COLONOSCOPY;  Surgeon: Rogene Houston, MD;  Location: [facility] ENDO SUITE;  Service: Endoscopy;  Laterality: N/A;   HERNIA REPAIR     RIH   IR  VERTEBROPLASTY LUMBAR BX INC UNI/BIL INC/INJECT/IMAGING  09/05/2018   MASTECTOMY  2011   right breast   TEE WITHOUT CARDIOVERSION N/A 06/27/2018   Procedure: TRANSESOPHAGEAL ECHOCARDIOGRAM (TEE) WITH PROPOFOL;  Surgeon: Arnoldo Lenis, MD;  Location: [facility] ENDO SUITE;  Service: Endoscopy;  Laterality: N/A;     OB History    Gravida      Para      Term      Preterm      AB      Living  5     SAB      TAB      Ectopic      Multiple      Live Births               Home Medications    Prior to Admission medications   Medication Sig Start Date End Date Taking? Authorizing Provider  acetaminophen (TYLENOL) 500 MG tablet Take 500 mg by mouth every 8 (eight) hours as needed for mild pain or headache.     Lavella Lemons, PA  albuterol (PROVENTIL) (2.5 MG/3ML) 0.083% nebulizer solution Take 2.5 mg by nebulization every 6 (six) hours as needed for wheezing or shortness of breath.    [provider]  diltiazem (CARDIZEM CD) 180 MG 24 hr capsule Take 1 capsule (180 mg total) by mouth daily. 12/12/18 01/11/19  Johnson, Clanford L, MD  furosemide (LASIX) 40 MG tablet Take 1 tablet (40 mg total) by mouth daily as needed for fluid. 12/11/18   Johnson, Clanford L, MD  metFORMIN (GLUCOPHAGE-XR) 500 MG 24 hr tablet Take 2 tablets (1,000 mg total) by mouth 2 (two) times daily with a meal. 12/11/18 01/10/19  Johnson, Clanford L, MD  metoprolol tartrate (LOPRESSOR) 25 MG tablet Take 1 tablet (25 mg total) by mouth 2 (two) times daily. 12/11/18 01/10/19  Johnson, Clanford L, MD  polyethylene glycol (MIRALAX / GLYCOLAX) 17 g packet Take 17 g by mouth daily as needed for mild constipation. 08/22/18   Barton Dubois, MD  potassium chloride SA (KLOR-CON) 20 MEQ tablet Take 1 tablet (20 mEq total) by mouth daily as needed (take only when taking lasix). 12/11/18   Johnson, Clanford L, MD  predniSONE (DELTASONE) 20 MG tablet Take 2 PO QAM x5 days 12/12/18   Murlean Iba, MD  PROAIR HFA  108 (90 BASE) MCG/ACT inhaler Inhale 2 puffs into the lungs every 6 (six) hours as needed for wheezing or shortness of breath.  03/06/11   [provider]  QUEtiapine (SEROQUEL) 25 MG tablet Take 1 tablet (25 mg total) by mouth daily at 6 PM. 11/11/18 12/11/18  Manuella Ghazi, Pratik D, DO  rivaroxaban (XARELTO) 20 MG TABS tablet Take 1 tablet (20 mg total) by mouth daily with supper. Stop Xarelto until Monday (08/26/18) in anticipation for kyphoplasty 12/12/18   Johnson, Clanford L, MD  TRELEGY ELLIPTA 100-62.5-25 MCG/INH AEPB Take 1 puff by mouth daily. 08/27/17   [provider]    Family History Family History  Problem Relation Age of Onset   Cancer  Mother        lung   Diabetes Brother     Social History Social History   Tobacco Use   Smoking status: Former Smoker    Packs/day: 0.50    Years: 32.00    Pack years: 16.00    Types: Cigarettes    Start date: 12/12/1962    Quit date: 03/13/1994    Years since quitting: 24.8   Smokeless tobacco: Never Used  Substance Use Topics   Alcohol use: No    Alcohol/week: 0.0 standard drinks   Drug use: No     Allergies   Codeine   Review of Systems Review of Systems  Unable to perform ROS: Dementia  Neurological: Positive for weakness.     Physical Exam Updated Vital Signs BP (!) 100/55    Pulse 87    Temp 98.5 F (36.9 C) (Oral)    Resp 18    Ht 1.575 m (5\' 2" )    Wt 67 kg    SpO2 98%    BMI 27.02 kg/m   Physical Exam Vitals signs and nursing note reviewed.  Constitutional:      General: She is in acute distress.     Appearance: She is well-developed. She is ill-appearing.  HENT:     Head: Normocephalic and atraumatic.     Mouth/Throat:     Pharynx: No oropharyngeal exudate.  Eyes:     General: No scleral icterus.       Right eye: No discharge.        Left eye: No discharge.     Conjunctiva/sclera: Conjunctivae normal.     Pupils: Pupils are equal, round, and reactive to light.  Neck:      Musculoskeletal: Normal range of motion and neck supple.     Thyroid: No thyromegaly.     Vascular: No JVD.  Cardiovascular:     Rate and Rhythm: Normal rate and regular rhythm.     Heart sounds: Normal heart sounds. No murmur. No friction rub. No gallop.   Pulmonary:     Effort: Respiratory distress present.     Breath sounds: Wheezing and rales present.     Comments: There is rales at the bases, the patient is unable to take a deep breath, oxygen levels range between 75 and 85% based on effort, mild tachypnea, prolonged expiratory phase, decreased lung sounds overall Abdominal:     General: Bowel sounds are normal. There is no distension.     Palpations: Abdomen is soft. There is no mass.     Tenderness: There is no abdominal tenderness.  Musculoskeletal: Normal range of motion.        General: No tenderness.     Right lower leg: Edema present.     Left lower leg: No edema.     Comments: Scant edema on the right, slight asymmetry  Lymphadenopathy:     Cervical: No cervical adenopathy.  Skin:    General: Skin is warm and dry.     Findings: No erythema or rash.  Neurological:     Mental Status: She is alert.     Coordination: Coordination normal.     Comments: The patient is somnolent to obtunded, able to open her eyes and take the occasional deep breath when asked, able to lift both of her arms though she is weak and lets them fall, does not lift either of her legs.  Has slight asterixis  Psychiatric:        Behavior: Behavior normal.  ED Treatments / Results  Labs (all labs ordered are listed, but only abnormal results are displayed) Labs Reviewed  BLOOD GAS, ARTERIAL - Abnormal; Notable for the following components:      Result Value   pCO2 arterial 81.8 (*)    pO2, Arterial 55.2 (*)    Bicarbonate 41.7 (*)    Acid-Base Excess 19.0 (*)    All other components within normal limits  CBC WITH DIFFERENTIAL/PLATELET - Abnormal; Notable for the following components:    WBC 12.6 (*)    RBC 3.35 (*)    Hemoglobin 9.0 (*)    HCT 33.4 (*)    MCHC 26.9 (*)    nRBC 0.3 (*)    Neutro Abs 10.4 (*)    Abs Immature Granulocytes 0.12 (*)    All other components within normal limits  COMPREHENSIVE METABOLIC PANEL - Abnormal; Notable for the following components:   Chloride 87 (*)    CO2 45 (*)    Glucose, Bld 225 (*)    Albumin 3.3 (*)    All other components within normal limits  BRAIN NATRIURETIC PEPTIDE - Abnormal; Notable for the following components:   B Natriuretic Peptide 339.0 (*)    All other components within normal limits  SARS CORONAVIRUS 2 BY RT PCR (HOSPITAL ORDER, [place] [facility])  URINE CULTURE  CULTURE, BLOOD (ROUTINE X 2)  CULTURE, BLOOD (ROUTINE X 2)  AMMONIA  URINALYSIS, ROUTINE W REFLEX MICROSCOPIC  LACTIC ACID, PLASMA  LACTIC ACID, PLASMA  APTT  PROTIME-INR  TROPONIN I (HIGH SENSITIVITY)    EKG EKG Interpretation  Date/Time:  Thursday December 26 2018 10:54:07 EDT Ventricular Rate:  94 PR Interval:    QRS Duration: 87 QT Interval:  364 QTC Calculation: 456 R Axis:   10 Text Interpretation:  Sinus rhythm Probable left atrial enlargement Borderline T wave abnormalities since last tracing no significant change Confirmed by Noemi Chapel [phone]) on 12/26/2018 10:59:28 AM   Radiology Dg Chest Port 1 View  Result Date: 12/26/2018 CLINICAL DATA:  Generalized weakness and worsening shortness of breath with exertion. Status post fall yesterday. EXAM: PORTABLE CHEST 1 VIEW COMPARISON:  Single-view of the chest 12/08/2018 and 11/19/2018. FINDINGS: There is cardiomegaly and pulmonary edema with small bilateral pleural effusions. Right basilar airspace disease is seen. No pneumothorax. No acute bony abnormality. IMPRESSION: Pulmonary edema with associated small effusions, larger on the right. Right basilar airspace disease could be atelectasis or pneumonia. Electronically Signed   By: Inge Rise M.D.   On:  12/26/2018 11:32    Procedures .Critical Care Performed by: Noemi Chapel, MD Authorized by: Noemi Chapel, MD   Critical care provider statement:    Critical care time (minutes):  35   Critical care time was exclusive of:  Separately billable procedures and treating other patients and teaching time   Critical care was necessary to treat or prevent imminent or life-threatening deterioration of the following conditions:  Sepsis and respiratory failure   Critical care was time spent personally by me on the following activities:  Blood draw for specimens, development of treatment plan with patient or surrogate, discussions with consultants, evaluation of patient's response to treatment, examination of patient, obtaining history from patient or surrogate, ordering and performing treatments and interventions, ordering and review of laboratory studies, ordering and review of radiographic studies, pulse oximetry, re-evaluation of patient's condition and review of old charts   (including critical care time)  Medications Ordered in ED Medications  0.9 %  sodium chloride infusion ( Intravenous New Bag/Given 12/26/18 1110)  cefTRIAXone (ROCEPHIN) 2 g in sodium chloride 0.9 % 100 mL IVPB (2 g Intravenous New Bag/Given 12/26/18 1227)  azithromycin (ZITHROMAX) 500 mg in sodium chloride 0.9 % 250 mL IVPB (has no administration in time range)  albuterol (PROVENTIL,VENTOLIN) solution continuous neb (has no administration in time range)  magnesium sulfate IVPB 2 g 50 mL (0 g Intravenous Stopped 12/26/18 1219)  methylPREDNISolone sodium succinate (SOLU-MEDROL) 125 mg/2 mL injection 125 mg (125 mg Intravenous Given 12/26/18 1111)  ondansetron (ZOFRAN) injection 4 mg (4 mg Intravenous Given 12/26/18 1111)  albuterol (VENTOLIN HFA) 108 (90 Base) MCG/ACT inhaler 2 puff (2 puffs Inhalation Given 12/26/18 1106)     Initial Impression / Assessment and Plan / ED Course  I have reviewed the triage vital signs and  the nursing notes.  Pertinent labs & imaging results that were available during my care of the patient were reviewed by me and considered in my medical decision making (see chart for details).  Clinical Course as of Dec 25 1252  Thu Dec 26, 2018  1124 PCO2 is >80, O2 is correcting on NRB, no fever, no tachycarida and normotensive    [BM]  1140 There is a leukocytosis today compared to prior blood work, the hemoglobin is slightly higher than baseline.   [BM]  1141 X-ray viewed and shows likely pulmonary edema, possible infiltrates at the bases, given the leukocytosis and hypoxia we will treat this as bacterial pneumonia, cultures obtained, will activate code sepsis given the leukocytosis.  This is a change in the patient's status who does not have a fever, is not hypotensive    [BM]    Clinical Course User Index [BM] Noemi Chapel, MD       The patient is ill-appearing, I suspect this is acute on chronic respiratory failure probably brought in by CO2 narcosis, check ABG, chest x-ray to rule out pneumonia given the rales, Covid given the rales, albuterol inhalers if the patient is able to tolerate them and oxygenate as needed.  The patient will have a rapid Covid test given her decline to hopefully prevent intubation by being able to use either BiPAP or nebulized treatments.  Discussed with Dr. Wynetta Emery who will admit to the high level of care, the patient is currently getting BiPAP and a continuous nebulizer for what appears to be hypercapnic respiratory failure, code sepsis activated due to infiltrate and antibiotics ordered  Final Clinical Impressions(s) / ED Diagnoses   Final diagnoses:  Acute on chronic respiratory failure with hypercapnia ([place])  Sepsis without acute organ dysfunction, due to unspecified organism [facility])  Community acquired pneumonia of right lower lobe of lung      Noemi Chapel, MD 12/26/18 1254

## 2018-12-26 NOTE — H&P (Signed)
History and Physical  Eileen A Golubski [id number] DOB: [birth year] DOA: 12/26/2018  Referring physician: Sabra Heck MD   PCP: Lavella Lemons, PA   Chief Complaint: weakness   Historian: Pt unable to give history due to dementia, altered mentation, history taken from ED providers, records  HPI: Eileen [name] is a 73 y.o. female with advanced end stage COPD, dementia, atrial flutter, hypertension, type 2 diabetes mellitus, right breast cancer, multiple recent hospitalizations for frailty, progressive lung disease and GI bleeding presented by EMS with weakness and encephalopathy.  The patient has been having increasing shortness of breath and mental status changes over the past several days.  Paramedics found patient to have an oxygen saturation of 80% and was nearly obtunded.  Of note, paramedics were not sure patient was receiving oxygen as she had 50 foot tubing in place and an oxygen concentrator that could not be confirmed was working properly.  She apparently has been having cough and chest congestion over the past several days.  She has been weak and has fallen down at home injuring her right ankle.  ED course: On arrival she was obtunded with oxygen saturations in the 70-80 range and coarse upper airway congestion noises heard and rapid response was required.  She was started on nebulizer treatments.  She received an ABG with findings of pH of 7.363, PCO2 81, PO2 55.  Glucose was 225.  BNP was 339.0.  White blood cell count was 12.6.  Hemoglobin was 9.  SARS 2 coronavirus test was negative.  Chest x-ray with findings of right basilar pneumonia and pulmonary edema.  Patient was started on BiPAP therapy after continuous nebulization was ordered.  Hospitalization was requested for further management.  Review of Systems: Unable to obtain due to current condition.  Past Medical History:  Diagnosis Date  . Asthma   . Atrial flutter ([place])   . Cancer [facility])    Right breast  . COPD (chronic  obstructive pulmonary disease) ([place])   . Dementia ([place])   . Diverticulosis   . Hemorrhoids   . History of breast cancer    right breast  . Hypertension   . On home O2   . Type 2 diabetes mellitus ([place])    Past Surgical History:  Procedure Laterality Date  . BREAST SURGERY  2011   Right breast mastectomy  . CARDIOVERSION N/A 06/27/2018   Procedure: CARDIOVERSION;  Surgeon: Arnoldo Lenis, MD;  [facility];  Service: Endoscopy;  Laterality: N/A;  . CESAREAN SECTION    . COLONOSCOPY N/A 01/22/2018   Procedure: COLONOSCOPY;  Surgeon: Rogene Houston, MD;  Location: [facility];  Service: Endoscopy;  Laterality: N/A;  . HERNIA REPAIR     RIH  . IR VERTEBROPLASTY LUMBAR BX INC UNI/BIL INC/INJECT/IMAGING  09/05/2018  . MASTECTOMY  2011   right breast  . TEE WITHOUT CARDIOVERSION N/A 06/27/2018   Procedure: TRANSESOPHAGEAL ECHOCARDIOGRAM (TEE) WITH PROPOFOL;  Surgeon: Arnoldo Lenis, MD;  Location[facility];  Service: Endoscopy;  Laterality: N/A;   Social History:  reports that she quit smoking about 24 years ago. Her smoking use included cigarettes. She started smoking about 56 years ago. She has a 16.00 pack-year smoking history. She has never used smokeless tobacco. She reports that she does not drink alcohol or use drugs.  Allergies  Allergen Reactions  . Codeine Other (See Comments)    "jittery"    Family History  Problem Relation Age of Onset  .  Cancer Mother        lung  . Diabetes Brother     Prior to Admission medications   Medication Sig Start Date End Date Taking? Authorizing Provider  acetaminophen (TYLENOL) 500 MG tablet Take 500 mg by mouth every 8 (eight) hours as needed for mild pain or headache.     Lavella Lemons, PA  albuterol (PROVENTIL) (2.5 MG/3ML) 0.083% nebulizer solution Take 2.5 mg by nebulization every 6 (six) hours as needed for wheezing or shortness of breath.    [provider]  diltiazem (CARDIZEM CD) 180 MG 24 hr  capsule Take 1 capsule (180 mg total) by mouth daily. 12/12/18 01/11/19  Johnson, Clanford L, MD  furosemide (LASIX) 40 MG tablet Take 1 tablet (40 mg total) by mouth daily as needed for fluid. 12/11/18   Johnson, Clanford L, MD  metFORMIN (GLUCOPHAGE-XR) 500 MG 24 hr tablet Take 2 tablets (1,000 mg total) by mouth 2 (two) times daily with a meal. 12/11/18 01/10/19  Johnson, Clanford L, MD  metoprolol tartrate (LOPRESSOR) 25 MG tablet Take 1 tablet (25 mg total) by mouth 2 (two) times daily. 12/11/18 01/10/19  Johnson, Clanford L, MD  polyethylene glycol (MIRALAX / GLYCOLAX) 17 g packet Take 17 g by mouth daily as needed for mild constipation. 08/22/18   Barton Dubois, MD  potassium chloride SA (KLOR-CON) 20 MEQ tablet Take 1 tablet (20 mEq total) by mouth daily as needed (take only when taking lasix). 12/11/18   Johnson, Clanford L, MD  predniSONE (DELTASONE) 20 MG tablet Take 2 PO QAM x5 days 12/12/18   Murlean Iba, MD  PROAIR HFA 108 (90 BASE) MCG/ACT inhaler Inhale 2 puffs into the lungs every 6 (six) hours as needed for wheezing or shortness of breath.  03/06/11   [provider]  QUEtiapine (SEROQUEL) 25 MG tablet Take 1 tablet (25 mg total) by mouth daily at 6 PM. 11/11/18 12/11/18  Manuella Ghazi, Pratik D, DO  rivaroxaban (XARELTO) 20 MG TABS tablet Take 1 tablet (20 mg total) by mouth daily with supper. Stop Xarelto until Monday (08/26/18) in anticipation for kyphoplasty 12/12/18   Johnson, Clanford L, MD  TRELEGY ELLIPTA 100-62.5-25 MCG/INH AEPB Take 1 puff by mouth daily. 08/27/17   [provider]   Physical Exam: Vitals:   12/26/18 1150 12/26/18 1200 12/26/18 1230 12/26/18 1300  BP:  109/67 (!) 100/55 (!) 105/53  Pulse:  94 87 88  Resp:  (!) 24 18 (!) 30  Temp:      TempSrc:      SpO2: 98% 100% 98% 98%  Weight:      Height:         General exam: Moderately built and nourished patient, lying comfortably supine on the gurney in no obvious distress.  Patient is on BiPAP  therapy.  Head, eyes and ENT: Nontraumatic and normocephalic. Pupils equally reacting to light and accommodation. Oral mucosa moist.  Neck: Supple. No JVD, carotid bruit or thyromegaly.  Lymphatics: No lymphadenopathy.  Respiratory system: Poor air movement bilateral.  Diffuse basilar wheezes and Rales heard in the right lower lobe at the base.  Moderate to severe increased work of breathing.  Cardiovascular system: S1 and S2 heard, irregularly.  2+ pedal edema bilateral lower extremities.  Gastrointestinal system: Abdomen is nondistended, soft and nontender. Normal bowel sounds heard. No organomegaly or masses appreciated.  Central nervous system: Obtunded on BiPAP. No focal neurological deficits.  Extremities: Symmetric 5 x 5 power. Peripheral pulses symmetrically felt.  Skin: No rashes or acute findings.  Musculoskeletal system: Negative exam.  Psychiatry: Obtunded.  Labs on Admission:  Basic Metabolic Panel: Recent Labs  Lab 12/26/18 1041  NA 141  K 4.3  CL 87*  CO2 45*  GLUCOSE 225*  BUN 8  CREATININE 0.52  CALCIUM 9.8   Liver Function Tests: Recent Labs  Lab 12/26/18 1041  AST 17  ALT 15  ALKPHOS 80  BILITOT 0.6  PROT 6.5  ALBUMIN 3.3*   No results for input(s): LIPASE, AMYLASE in the last 168 hours. Recent Labs  Lab 12/26/18 1043  AMMONIA 24   CBC: Recent Labs  Lab 12/26/18 1041  WBC 12.6*  NEUTROABS 10.4*  HGB 9.0*  HCT 33.4*  MCV 99.7  PLT 318   Cardiac Enzymes: No results for input(s): CKTOTAL, CKMB, CKMBINDEX, TROPONINI in the last 168 hours.  BNP (last 3 results) No results for input(s): PROBNP in the last 8760 hours. CBG: No results for input(s): GLUCAP in the last 168 hours.  Radiological Exams on Admission: Dg Chest Port 1 View  Result Date: 12/26/2018 CLINICAL DATA:  Generalized weakness and worsening shortness of breath with exertion. Status post fall yesterday. EXAM: PORTABLE CHEST 1 VIEW COMPARISON:  Single-view of the  chest 12/08/2018 and 11/19/2018. FINDINGS: There is cardiomegaly and pulmonary edema with small bilateral pleural effusions. Right basilar airspace disease is seen. No pneumothorax. No acute bony abnormality. IMPRESSION: Pulmonary edema with associated small effusions, larger on the right. Right basilar airspace disease could be atelectasis or pneumonia. Electronically Signed   By: Inge Rise M.D.   On: 12/26/2018 11:32   Assessment/Plan Active Problems:   COPD (chronic obstructive pulmonary disease) (HCC)   COPD with acute exacerbation (HCC)   Acute on chronic respiratory failure (HCC)   Diabetes mellitus (HCC)   Chronic respiratory failure with hypoxia (HCC)   Leukocytosis   Breast cancer (HCC)   Osteoporosis   Vitamin D deficiency   Anemia   Bronchiectasis with acute exacerbation (HCC)   Dementia with behavioral disturbance (HCC)   Acute respiratory failure with hypoxia and hypercapnia (HCC)   On home O2   CO2 narcosis   Acute and chronic respiratory failure with hypercapnia (HCC)   Pulmonary edema   Elevated brain natriuretic peptide (BNP) level  1. Acute on chronic respiratory failure with hypercapnia-secondary to COPD exacerbation, patient's respiratory disease seems to be worsening.   She is not able to be out of the hospital for more than a couple weeks at a time.  Continue BiPAP treatment.  Continue scheduled bronchodilators and IV steroids and antibiotics. 2. Right basilar pneumonia-continue IV antibiotics follow cultures.  Continue supportive therapy. 3. Leukocytosis secondary to pneumonia-follow CBC with differential closely.  Anticipate stress demargination from steroids.  Will follow.  4. Pulmonary Edema - IV lasix 40 mg twice daily ordered.  Follow daily weights, intake and output. 5. CO2 narcosis-patient has been started on BiPAP therapy to help bring down CO2 levels.  Treating COPD as noted above. 6. Dementia with behavioral disturbances-anticipate some sundowning  given patient will be in the stepdown ICU will provide Haldol as needed for symptoms. 7. Type 2 diabetes mellitus with hyperglycemia-started Lantus and every 4 hour sliding scale coverage with prandial coverage.  Monitor CBG closely.  I have asked for CBG to be tested every 4 hours while she is on BiPAP. 8. Atrial flutter-patient is on Xarelto for full anticoagulation and metoprolol for rate control. 9. Goals of care: Patient continues to have progressive respiratory  failure despite multiple recent hospitalizations and therapies.  Her mortality remains very high and I worry that intubation would lead to prolonged suffering on a ventilator with no meaningful improvement.  I am asking for palliative medicine consultation to help with goals of care and end-of-life planning.   DVT Prophylaxis: xarelto Code Status: Full   Family Communication: not available at bedside  Disposition Plan: inpatient stepdown ICU admission    Critical care Time spent: 65 mins  Clanford Wynetta Emery, MD [facility] How to contact the [facility] Attending or Consulting provider [place] or covering provider during after hours [place], for this patient?  1. Check the care team in [facility] and look for a) attending/consulting [facility] provider listed and b) the [facility] team listed 2. Log into [email or web address] and use [place]'s universal password to access. If you do not have the password, please contact the hospital operator. 3. Locate the [facility] provider you are looking for under [facility] and page to a number that you can be directly reached. 4. If you still have difficulty reaching the provider, please page the [facility] (Director on Call) for the Hospitalists listed on amion for assistance.

## 2018-12-27 ENCOUNTER — Inpatient Hospital Stay (HOSPITAL_COMMUNITY): Payer: Medicare Other

## 2018-12-27 DIAGNOSIS — J810 Acute pulmonary edema: Secondary | ICD-10-CM

## 2018-12-27 DIAGNOSIS — J9611 Chronic respiratory failure with hypoxia: Secondary | ICD-10-CM

## 2018-12-27 DIAGNOSIS — Z9981 Dependence on supplemental oxygen: Secondary | ICD-10-CM

## 2018-12-27 LAB — CBC WITH DIFFERENTIAL/PLATELET
Abs Immature Granulocytes: 0.06 10*3/uL (ref 0.00–0.07)
Basophils Absolute: 0 10*3/uL (ref 0.0–0.1)
Basophils Relative: 0 %
Eosinophils Absolute: 0 10*3/uL (ref 0.0–0.5)
Eosinophils Relative: 0 %
HCT: 28.8 % — ABNORMAL LOW (ref 36.0–46.0)
Hemoglobin: 7.7 g/dL — ABNORMAL LOW (ref 12.0–15.0)
Immature Granulocytes: 1 %
Lymphocytes Relative: 6 %
Lymphs Abs: 0.7 10*3/uL (ref 0.7–4.0)
MCH: 26.5 pg (ref 26.0–34.0)
MCHC: 26.7 g/dL — ABNORMAL LOW (ref 30.0–36.0)
MCV: 99 fL (ref 80.0–100.0)
Monocytes Absolute: 0.4 10*3/uL (ref 0.1–1.0)
Monocytes Relative: 4 %
Neutro Abs: 9.3 10*3/uL — ABNORMAL HIGH (ref 1.7–7.7)
Neutrophils Relative %: 89 %
Platelets: 277 10*3/uL (ref 150–400)
RBC: 2.91 MIL/uL — ABNORMAL LOW (ref 3.87–5.11)
RDW: 15.1 % (ref 11.5–15.5)
WBC: 10.4 10*3/uL (ref 4.0–10.5)
nRBC: 0.2 % (ref 0.0–0.2)

## 2018-12-27 LAB — MRSA PCR SCREENING: MRSA by PCR: NEGATIVE

## 2018-12-27 LAB — BRAIN NATRIURETIC PEPTIDE: B Natriuretic Peptide: 353 pg/mL — ABNORMAL HIGH (ref 0.0–100.0)

## 2018-12-27 LAB — COMPREHENSIVE METABOLIC PANEL
ALT: 15 U/L (ref 0–44)
AST: 12 U/L — ABNORMAL LOW (ref 15–41)
Albumin: 2.9 g/dL — ABNORMAL LOW (ref 3.5–5.0)
Alkaline Phosphatase: 66 U/L (ref 38–126)
Anion gap: 11 (ref 5–15)
BUN: 10 mg/dL (ref 8–23)
CO2: 39 mmol/L — ABNORMAL HIGH (ref 22–32)
Calcium: 8.8 mg/dL — ABNORMAL LOW (ref 8.9–10.3)
Chloride: 93 mmol/L — ABNORMAL LOW (ref 98–111)
Creatinine, Ser: 0.59 mg/dL (ref 0.44–1.00)
GFR calc Af Amer: >60 mL/min (ref >60)
GFR calc non Af Amer: >60 mL/min (ref >60)
Glucose, Bld: 283 mg/dL — ABNORMAL HIGH (ref 70–99)
Potassium: 4.5 mmol/L (ref 3.5–5.1)
Sodium: 143 mmol/L (ref 135–145)
Total Bilirubin: 0.6 mg/dL (ref 0.3–1.2)
Total Protein: 5.7 g/dL — ABNORMAL LOW (ref 6.5–8.1)

## 2018-12-27 LAB — GLUCOSE, CAPILLARY
Glucose-Capillary: 271 mg/dL — ABNORMAL HIGH (ref 70–99)
Glucose-Capillary: 230 mg/dL — ABNORMAL HIGH (ref 70–99)
Glucose-Capillary: 119 mg/dL — ABNORMAL HIGH (ref 70–99)
Glucose-Capillary: 280 mg/dL — ABNORMAL HIGH (ref 70–99)
Glucose-Capillary: 99 mg/dL (ref 70–99)

## 2018-12-27 LAB — MAGNESIUM: Magnesium: 2 mg/dL (ref 1.7–2.4)

## 2018-12-27 MED ORDER — INSULIN GLARGINE 100 UNIT/ML ~~LOC~~ SOLN
22.0000 [IU] | SUBCUTANEOUS | Status: DC
  Administered 2018-12-27: 11 [IU] via SUBCUTANEOUS
  Filled 2018-12-27 (×3): qty 0.22

## 2018-12-27 MED ORDER — INSULIN ASPART 100 UNIT/ML ~~LOC~~ SOLN
8.0000 [IU] | Freq: Three times a day (TID) | SUBCUTANEOUS | Status: DC
  Administered 2018-12-27 (×3): 8 [IU] via SUBCUTANEOUS

## 2018-12-27 MED ORDER — FUROSEMIDE 10 MG/ML IJ SOLN
40.0000 mg | Freq: Two times a day (BID) | INTRAMUSCULAR | Status: DC
  Administered 2018-12-27 – 2019-01-02 (×13): 40 mg via INTRAVENOUS
  Filled 2018-12-27 (×14): qty 4

## 2018-12-27 MED ORDER — PIPERACILLIN-TAZOBACTAM 3.375 G IVPB: 3.3750 g | Freq: Three times a day (TID) | INTRAVENOUS | Status: DC

## 2018-12-27 MED ORDER — SODIUM CHLORIDE 0.9 % IV SOLN
1.0000 g | Freq: Three times a day (TID) | INTRAVENOUS | Status: AC
  Administered 2018-12-27 – 2019-01-01 (×18): 1 g via INTRAVENOUS
  Filled 2018-12-27 (×17): qty 1

## 2018-12-27 MED ORDER — VANCOMYCIN HCL 1.5 G IV SOLR
1500.0000 mg | Freq: Once | INTRAVENOUS | Status: DC
  Filled 2018-12-27: qty 1500

## 2018-12-27 MED ORDER — VANCOMYCIN HCL 500 MG IV SOLR
500.0000 mg | Freq: Two times a day (BID) | INTRAVENOUS | Status: DC
  Filled 2018-12-27 (×6): qty 500

## 2018-12-27 NOTE — Evaluation (Signed)
Physical Therapy Evaluation Patient Details Name: Eileen [name] MRN: [id number] DOB: 1945/04/11 Today's Date: 12/27/2018   History of Present Illness  Eileen [name] is a 73 y.o. female with advanced end stage COPD, dementia, atrial flutter, hypertension, type 2 diabetes mellitus, right breast cancer, multiple recent hospitalizations for frailty, progressive lung disease and GI bleeding presented by EMS with weakness and encephalopathy.  The patient has been having increasing shortness of breath and mental status changes over the past several days.  Paramedics found patient to have an oxygen saturation of 80% and was nearly obtunded.  Of note, paramedics were not sure patient was receiving oxygen as she had 50 foot tubing in place and an oxygen concentrator that could not be confirmed was working properly.  She apparently has been having cough and chest congestion over the past several days.  She has been weak and has fallen down at home injuring her right ankle.    Clinical Impression  Patient demonstrates good return for sitting up at bedside with slightly labored movement, limited to a few steps at bedside mostly due to SOB with SpO2 dropping from 95% to below 80% wile on 6 LPM and tolerated sitting up in chair after therapy - RN notified.  Patient will benefit from continued physical therapy in hospital and recommended venue below to increase strength, balance, endurance for safe ADLs and gait.    Follow Up Recommendations SNF;Supervision for mobility/OOB;Supervision/Assistance - 24 hour    Equipment Recommendations  None recommended by PT    Recommendations for Other Services       Precautions / Restrictions Precautions Precautions: Fall Restrictions Weight Bearing Restrictions: No      Mobility  Bed Mobility Overal bed mobility: Needs Assistance Bed Mobility: Supine to Sit     Supine to sit: Supervision     General bed mobility comments: increased time, labored  movement  Transfers Overall transfer level: Needs assistance Equipment used: Rolling walker (2 wheeled) Transfers: Sit to/from [facility] Sit to Stand: Min assist Stand pivot transfers: Min assist       General transfer comment: slow labored movement  Ambulation/Gait Ambulation/Gait assistance: Min assist;Mod assist Gait Distance (Feet): 5 Feet Assistive device: Rolling walker (2 wheeled) Gait Pattern/deviations: Decreased step length - right;Decreased step length - left;Decreased stride length Gait velocity: slow   General Gait Details: limited to 5-6 slow labored steps at bedside mostly due to SpO2 desaturation from 95% to below 80% while on 6 LPM O2  Stairs            Wheelchair Mobility    Modified Rankin (Stroke Patients Only)       Balance Overall balance assessment: Needs assistance Sitting-balance support: Feet supported;No upper extremity supported Sitting balance-Leahy Scale: Good     Standing balance support: During functional activity;Bilateral upper extremity supported Standing balance-Leahy Scale: Fair Standing balance comment: using RW                             Pertinent Vitals/Pain Pain Assessment: No/denies pain    Home Living Family/patient expects to be discharged to:: Private residence Living Arrangements: Children Available Help at Discharge: Family;Available 24 hours/day Type of Home: House Home Access: Stairs to enter Entrance Stairs-Rails: Right;Left;Can reach both Entrance Stairs-Number of Steps: 3 Home Layout: One level Home Equipment: Walker - 2 wheels      Prior Function Level of Independence: Needs assistance   Gait / Transfers Assistance Needed: household  ambulator with RW, uses 4 LPM O2 at all times at home  ADL's / Homemaking Assistance Needed: assisted for community ADLs' by family        Hand Dominance   Dominant Hand: Right    Extremity/Trunk Assessment   Upper Extremity  Assessment Upper Extremity Assessment: Generalized weakness    Lower Extremity Assessment Lower Extremity Assessment: Generalized weakness    Cervical / Trunk Assessment Cervical / Trunk Assessment: Normal  Communication   Communication: No difficulties  Cognition Arousal/Alertness: Awake/alert Behavior During Therapy: WFL for tasks assessed/performed Overall Cognitive Status: History of cognitive impairments - at baseline                                        General Comments      Exercises     Assessment/Plan    PT Assessment Patient needs continued PT services  PT Problem List Decreased strength;Decreased activity tolerance;Decreased balance;Decreased mobility       PT Treatment Interventions Gait training;Stair training;Functional mobility training;Therapeutic activities;Therapeutic exercise;Patient/family education    PT Goals (Current goals can be found in the Care Plan section)  Acute Rehab PT Goals Patient Stated Goal: return home with family to assist PT Goal Formulation: With patient Time For Goal Achievement: 01/10/19 Potential to Achieve Goals: Good    Frequency Min 3X/week   Barriers to discharge        Co-evaluation               AM-PAC PT "6 Clicks" Mobility  Outcome Measure Help needed turning from your back to your side while in a flat bed without using bedrails?: None Help needed moving from lying on your back to sitting on the side of a flat bed without using bedrails?: None Help needed moving to and from a bed to a chair (including a wheelchair)?: A Little Help needed standing up from a chair using your arms (e.g., wheelchair or bedside chair)?: A Little Help needed to walk in hospital room?: A Lot Help needed climbing 3-5 steps with a railing? : A Lot 6 Click Score: 18    End of Session   Activity Tolerance: Patient tolerated treatment well;Patient limited by fatigue Patient left: in chair;with call bell/phone  within reach;with chair alarm set Nurse Communication: Mobility status PT Visit Diagnosis: Unsteadiness on feet (R26.81);Other abnormalities of gait and mobility (R26.89);Muscle weakness (generalized) (M62.81)    Time: [id number] PT Time Calculation (min) (ACUTE ONLY): 22 min   Charges:   PT Evaluation $PT Eval Moderate Complexity: 1 Mod PT Treatments $Therapeutic Activity: 8-22 mins        3:36 PM, 12/27/18 Lonell Grandchild, MPT Physical Therapist with [facility] [phone] office [phone] mobile phone

## 2018-12-27 NOTE — Progress Notes (Signed)
PROGRESS NOTE  Eileen [name]  [id number]  DOB: 1945/08/07  DOA: 12/26/2018 PCP: Lavella Lemons, PA  Brief Admission Hx: 73 y.o. female with advanced end stage COPD, dementia, atrial flutter, hypertension, type 2 diabetes mellitus, right breast cancer, multiple recent hospitalizations for frailty, progressive lung disease and GI bleeding presented by EMS with weakness and encephalopathy.   MDM/Assessment & Plan:   1. Acute on chronic respiratory failure with hypercapnia-patient remains on BiPAP therapy.  Appreciate assistance of pulmonologist Dr. Luan Pulling with management.  The patient is slowly improving but remains confused. 2. Right basilar pneumonia- patient likely aspirated and antibiotics have been changed to broaden coverage. 3. Pulmonary edema- continue IV Lasix for diuresis, monitor weights intake and output and electrolytes. 4. CO2 narcosis-this is being treated with BiPAP therapy.  Her confusion remains persistent.  It is somewhat improved from admission. 5. Dementia with behavioral disturbances- Haldol ordered as needed for symptoms. 6. Type 2 diabetes mellitus-she has hyperglycemia and we have increased her basal coverage and will continue SSI and prandial coverage. 7. Atrial flutter- patient is on IV metoprolol and Xarelto. 8. Because of care-anticipate palliative medicine consult on Monday if still in hospital.  DVT prophylaxis: Xarelto Code Status: Full Family Communication: Attempt telephone phone call Disposition Plan: Continue stepdown ICU   Consultants:  Pulmonology  Procedures:  BiPAP  Antimicrobials:  Meropenem  Subjective: Patient remains on BiPAP she is alert but confused.  Objective: Vitals:   12/27/18 0500 12/27/18 0752 12/27/18 0816 12/27/18 0819  BP:      Pulse:      Resp:      Temp:  97.9 F (36.6 C)    TempSrc:  Axillary    SpO2:   96% 97%  Weight: 67.5 kg     Height:        Intake/Output Summary (Last 24 hours) at 12/27/2018  1122 Last data filed at 12/27/2018 0300 Gross per 24 hour  Intake 516.11 ml  Output 400 ml  Net 116.11 ml   Filed Weights   12/26/18 1052 12/27/18 0500  Weight: 67 kg 67.5 kg   REVIEW OF SYSTEMS  Unable to obtain due to confusion  Exam:  General exam: Remains on BiPAP with moderate increased work of breathing.  She is alert and confused. Respiratory system: Diffuse poor air movement with Rales in the right lower lobe.  Moderate increased work of breathing. Cardiovascular system: Irregularly irregular S1 & S2 heard. No JVD, murmurs, gallops, clicks or pedal edema. Gastrointestinal system: Abdomen is nondistended, soft and nontender. Normal bowel sounds heard. Central nervous system: Alert and confused. No focal neurological deficits. Extremities: 2+ edema bilateral lower extremities.  Data Reviewed: Basic Metabolic Panel: Recent Labs  Lab 12/26/18 1041 12/27/18 0438  NA 141 143  K 4.3 4.5  CL 87* 93*  CO2 45* 39*  GLUCOSE 225* 283*  BUN 8 10  CREATININE 0.52 0.59  CALCIUM 9.8 8.8*  MG  --  2.0   Liver Function Tests: Recent Labs  Lab 12/26/18 1041 12/27/18 0438  AST 17 12*  ALT 15 15  ALKPHOS 80 66  BILITOT 0.6 0.6  PROT 6.5 5.7*  ALBUMIN 3.3* 2.9*   No results for input(s): LIPASE, AMYLASE in the last 168 hours. Recent Labs  Lab 12/26/18 1043  AMMONIA 24   CBC: Recent Labs  Lab 12/26/18 1041 12/27/18 0438  WBC 12.6* 10.4  NEUTROABS 10.4* 9.3*  HGB 9.0* 7.7*  HCT 33.4* 28.8*  MCV 99.7 99.0  PLT 318  277   Cardiac Enzymes: No results for input(s): CKTOTAL, CKMB, CKMBINDEX, TROPONINI in the last 168 hours. CBG (last 3)  Recent Labs    12/26/18 2218 12/27/18 0356 12/27/18 0755  GLUCAP 251* 271* 230*   Recent Results (from the past 240 hour(s))  SARS Coronavirus 2 by RT PCR (hospital order, performed in [facility] hospital lab) Nasopharyngeal Nasopharyngeal Swab     Status: None   Collection Time: 12/26/18 10:46 AM   Specimen:  Nasopharyngeal Swab  Result Value Ref Range Status   SARS Coronavirus 2 NEGATIVE NEGATIVE Final    Comment: (NOTE) If result is NEGATIVE SARS-CoV-2 target nucleic acids are NOT DETECTED. The SARS-CoV-2 RNA is generally detectable in upper and lower  respiratory specimens during the acute phase of infection. The lowest  concentration of SARS-CoV-2 viral copies this assay can detect is 250  copies / mL. A negative result does not preclude SARS-CoV-2 infection  and should not be used as the sole basis for treatment or other  patient management decisions.  A negative result may occur with  improper specimen collection / handling, submission of specimen other  than nasopharyngeal swab, presence of viral mutation(s) within the  areas targeted by this assay, and inadequate number of viral copies  (<250 copies / mL). A negative result must be combined with clinical  observations, patient history, and epidemiological information. If result is POSITIVE SARS-CoV-2 target nucleic acids are DETECTED. The SARS-CoV-2 RNA is generally detectable in upper and lower  respiratory specimens dur ing the acute phase of infection.  Positive  results are indicative of active infection with SARS-CoV-2.  Clinical  correlation with patient history and other diagnostic information is  necessary to determine patient infection status.  Positive results do  not rule out bacterial infection or co-infection with other viruses. If result is PRESUMPTIVE POSTIVE SARS-CoV-2 nucleic acids MAY BE PRESENT.   A presumptive positive result was obtained on the submitted specimen  and confirmed on repeat testing.  While 2019 novel coronavirus  (SARS-CoV-2) nucleic acids may be present in the submitted sample  additional confirmatory testing may be necessary for epidemiological  and / or clinical management purposes  to differentiate between  SARS-CoV-2 and other Sarbecovirus currently known to infect humans.  If clinically  indicated additional testing with an alternate test  methodology [phone]) is advised. The SARS-CoV-2 RNA is generally  detectable in upper and lower respiratory sp ecimens during the acute  phase of infection. The expected result is Negative. Fact Sheet for Patients:  StrictlyIdeas.no Fact Sheet for Healthcare Providers: BankingDealers.co.za This test is not yet approved or cleared by the [place] FDA and has been authorized for detection and/or diagnosis of SARS-CoV-2 by FDA under an Emergency Use Authorization (EUA).  This EUA will remain in effect (meaning this test can be used) for the duration of the COVID-19 declaration under Section 564(b)(1) of the Act, 21 U.S.C. section 360bbb-3(b)(1), unless the authorization is terminated or revoked sooner. Performed at [facility], [address], [place], [place] [address]   Blood Culture (routine x 2)     Status: None (Preliminary result)   Collection Time: 12/26/18 12:12 PM   Specimen: BLOOD LEFT HAND  Result Value Ref Range Status   Specimen Description BLOOD LEFT HAND  Final   Special Requests   Final    BOTTLES DRAWN AEROBIC AND ANAEROBIC Blood Culture results may not be optimal due to an inadequate volume of blood received in culture bottles   Culture  Final    NO GROWTH < 24 HOURS Performed at [facility], [address] [place], [place] [address]    Report Status PENDING  Incomplete  Blood Culture (routine x 2)     Status: None (Preliminary result)   Collection Time: 12/26/18 12:12 PM   Specimen: BLOOD LEFT HAND  Result Value Ref Range Status   Specimen Description BLOOD LEFT HAND  Final   Special Requests   Final    BOTTLES DRAWN AEROBIC AND ANAEROBIC Blood Culture results may not be optimal due to an inadequate volume of blood received in culture bottles   Culture   Final    NO GROWTH < 24 HOURS Performed at [facility], [address] [place], [place] [address]     Report Status PENDING  Incomplete  MRSA PCR Screening     Status: None   Collection Time: 12/26/18  8:18 PM   Specimen: Nasal Mucosa; Nasopharyngeal  Result Value Ref Range Status   MRSA by PCR NEGATIVE NEGATIVE Final    Comment:        The GeneXpert MRSA Assay (FDA approved for NASAL specimens only), is one component of a comprehensive MRSA colonization surveillance program. It is not intended to diagnose MRSA infection nor to guide or monitor treatment for MRSA infections. Performed at [facility], [address], [place], [place] [address]      Studies: Dg Ankle 2 Views Right  Result Date: 12/26/2018 CLINICAL DATA:  Right ankle pain. EXAM: RIGHT ANKLE - 2 VIEW COMPARISON:  No recent. FINDINGS: Diffuse soft tissue swelling. No acute bony abnormality identified. No evidence of fracture. Corticated bony densities noted adjacent to the mediolateral malleoli. These consistent old fracture fragments. IMPRESSION: 1.  Diffuse soft tissue swelling.  No acute bony abnormality. 2. Corticated bony densities noted adjacent to the medial and lateral malleoli consistent with old fracture fragments. Electronically Signed   By: Marcello Moores  Register   On: 12/26/2018 14:11   Dg Chest Port 1 View  Result Date: 12/27/2018 CLINICAL DATA:  Pulmonary edema EXAM: PORTABLE CHEST 1 VIEW COMPARISON:  Radiograph 12/26/2018 FINDINGS: Cardiomegaly with bilateral interstitial opacities asymmetrically increased in the right lung with bilateral pleural effusions. More confluent opacity is present in the right lung base. Overall appearance is not significantly changed from 1 day prior. Stable cardiomegaly. No pneumothorax. IMPRESSION: Appearance not significantly changed from 1 day prior with features of pulmonary edema and bilateral effusions. More confluent opacity in the right base possibly reflecting atelectasis or pneumonia. Stable cardiomegaly. Electronically Signed   By: Lovena Le M.D.   On: 12/27/2018 04:21    Dg Chest Port 1 View  Result Date: 12/26/2018 CLINICAL DATA:  Generalized weakness and worsening shortness of breath with exertion. Status post fall yesterday. EXAM: PORTABLE CHEST 1 VIEW COMPARISON:  Single-view of the chest 12/08/2018 and 11/19/2018. FINDINGS: There is cardiomegaly and pulmonary edema with small bilateral pleural effusions. Right basilar airspace disease is seen. No pneumothorax. No acute bony abnormality. IMPRESSION: Pulmonary edema with associated small effusions, larger on the right. Right basilar airspace disease could be atelectasis or pneumonia. Electronically Signed   By: Inge Rise M.D.   On: 12/26/2018 11:32   Scheduled Meds: . budesonide (PULMICORT) nebulizer solution  0.25 mg Nebulization BID  . Chlorhexidine Gluconate Cloth  6 each Topical Daily  . diltiazem  180 mg Oral Daily  . furosemide  40 mg Intravenous Q12H  . insulin aspart  0-20 Units Subcutaneous Q4H  . insulin aspart  8  Units Subcutaneous TID WC  . insulin glargine  22 Units Subcutaneous Q24H  . ipratropium-albuterol  3 mL Nebulization Q4H  . methylPREDNISolone (SOLU-MEDROL) injection  60 mg Intravenous Q6H  . metoprolol tartrate  2.5 mg Intravenous Q6H  . pantoprazole (PROTONIX) IV  40 mg Intravenous Q24H  . QUEtiapine  25 mg Oral q1800  . rivaroxaban  20 mg Oral Q supper   Continuous Infusions: . sodium chloride 30 mL/hr at 12/27/18 0300  . meropenem (MERREM) IV 1 g (12/27/18 0953)    Active Problems:   COPD (chronic obstructive pulmonary disease) (HCC)   COPD with acute exacerbation (HCC)   Acute on chronic respiratory failure (HCC)   Diabetes mellitus (HCC)   Chronic respiratory failure with hypoxia (HCC)   Leukocytosis   Breast cancer (HCC)   Osteoporosis   Vitamin D deficiency   Anemia   Bronchiectasis with acute exacerbation (HCC)   Dementia with behavioral disturbance (HCC)   Acute respiratory failure with hypoxia and hypercapnia (HCC)   On home O2   CO2 narcosis    Acute and chronic respiratory failure with hypercapnia (HCC)   Pulmonary edema   Elevated brain natriuretic peptide (BNP) level  Critical care time spent: 36 minutes  Irwin Brakeman, MD [facility] 12/27/2018, 11:22 AM    LOS: 1 day  How to contact the [facility] Attending or Consulting provider [place] or covering provider during after hours [place], for this patient?  1. Check the care team in [facility] and look for a) attending/consulting TRH provider listed and b) the [facility] team listed 2. Log into [email or web address] and use 's universal password to access. If you do not have the password, please contact the hospital operator. 3. Locate the [facility] provider you are looking for under [facility] and page to a number that you can be directly reached. 4. If you still have difficulty reaching the provider, please page the [facility] (Director on Call) for the Hospitalists listed on amion for assistance.

## 2018-12-27 NOTE — Progress Notes (Addendum)
Pharmacy Antibiotic Note  Eileen [name] is a 73 y.o. female admitted on 12/26/2018 with  pneumonia.  Pharmacy has been consulted for Merrem dosing.  Plan: Merrem 1gm IV q8h F/U cxs and clinical progress Monitor V/S, labs and levels as indicated  Height: 5\' 2"  (157.5 cm) Weight: 148 lb 13 oz (67.5 kg) IBW/kg (Calculated) : 50.1  Temp (24hrs), Avg:98.2 F (36.8 C), Min:97.9 F (36.6 C), Max:98.5 F (36.9 C)  Recent Labs  Lab 12/26/18 1041 12/26/18 1212 12/26/18 1404 12/27/18 0438  WBC 12.6*  --   --  10.4  CREATININE 0.52  --   --  0.59  LATICACIDVEN  --  2.0* 1.7  --     Estimated Creatinine Clearance: 55.6 mL/min (by C-G formula based on SCr of 0.59 mg/dL).    Allergies  Allergen Reactions  . Codeine Other (See Comments)    "jittery"    Antimicrobials this admission: Merrem 10/16>> >> Ceftriaxone 10/15>>10/16 Azithromycin 10/15>> 10/16  Dose adjustments this admission: n/a  Microbiology results: 10/15 BCx: ngtd 10/15 UCx: pending 10/15 MRSA PCR: neg 9/24: UCX: ESBL E.COLI  S- imipenem  Thank you for allowing pharmacy to be a part of this patient's care.  Isac Sarna, BS Pharm D, [place] Clinical Pharmacist Pager [phone] 12/27/2018 8:33 AM

## 2018-12-27 NOTE — TOC Initial Note (Signed)
Transition of Care [facility]) - Initial/Assessment Note    Patient Details  Name: Eileen [name] MRN: [id number] Date of Birth: [birth year]  Transition of Care [facility]) CM/SW Contact:    Antoinette Haskett, Chauncey [place], RN Phone Number: 12/27/2018, 11:24 AM  Clinical Narrative:   Patient is well known to [facility] due to multiple admissions, with high readmission score. 10 ED visits in six months and 10 admissions in one year.   Patient from home with daughter, Eileen [name]. Patient ambulates with a walker. Patient is active with [facility]  For PT/RN/SW. Eileen [name] of [facility] is aware of admission.   Patient confused this morning, just came off Bipap.   Per chart review, [facility] has closed case due to not being able to maintain contact with patient/daugther.   [facility] SW is working with patient on advance directives. Patient/family continue to refuse hospice services per notes.  Palliative consult has been placed for this admission for [facility], however palliative services are not available today on campus.   Family and patient have always wanted patient to return home with resumption of home health on previous admissions even refusing SNF when recommended. TOC to follow and address needs as they arise.        Expected Discharge Plan: [place] Barriers to Discharge: Continued Medical Work up   Patient Goals and CMS Choice Patient states their goals for this hospitalization and ongoing recovery are:: family wants patient to return home; will assess again      Expected Discharge Plan and Services Expected Discharge Plan: [place]   Discharge Planning Services: CM Consult Post Acute Care Choice: [place], Resumption of Svcs/PTA Provider                [facility] Agency: [place] Date [place]: 12/27/18 Time [place]: 19 Representative spoke with at [place]: Eileen [name] with [facility]  Prior Living Arrangements/Services   Lives with:: Adult  Children Patient language and need for interpreter reviewed:: Yes Do you feel safe going back to the place where you live?: Yes      Need for Family Participation in Patient Care: Yes (Comment) Care giver support system in place?: Yes (comment) Current home services: DME, Home RN, Home PT, Other (comment)(SW) Criminal Activity/Legal Involvement Pertinent to Current Situation/Hospitalization: No - Comment as needed  Activities of Daily Living Home Assistive Devices/Equipment: Oxygen, Nebulizer, Walker (specify type) ADL Screening (condition at time of admission) Patient's cognitive ability adequate to safely complete daily activities?: No Is the patient deaf or have difficulty hearing?: No Does the patient have difficulty seeing, even when wearing glasses/contacts?: No Does the patient have difficulty concentrating, remembering, or making decisions?: Yes Patient able to express need for assistance with ADLs?: No Does the patient have difficulty dressing or bathing?: Yes Independently performs ADLs?: No Communication: Independent Dressing (OT): Dependent Is this a change from baseline?: Change from baseline, expected to last <3days Grooming: Dependent Is this a change from baseline?: Change from baseline, expected to last <3 days Feeding: Dependent Is this a change from baseline?: Change from baseline, expected to last <3 days Bathing: Dependent Is this a change from baseline?: Change from baseline, expected to last <3 days Toileting: Dependent Is this a change from baseline?: Change from baseline, expected to last <3 days In/Out Bed: Dependent Is this a change from baseline?: Change from baseline, expected to last <3 days Walks in Home: Independent Does the patient have difficulty walking or climbing stairs?: Yes  Weakness of Legs: Both Weakness of Arms/Hands: None     Admission diagnosis:  Ankle pain, right [M25.571] Fall at home [W19.XXXA, Y92.009] Acute on chronic respiratory  failure with hypercapnia (HCC) [J96.22] Community acquired pneumonia of right lower lobe of lung [J18.9] Sepsis without acute organ dysfunction, due to unspecified organism [facility]) [A41.9] Patient Active Problem List   Diagnosis Date Noted  . Acute and chronic respiratory failure with hypercapnia ([place]) 12/26/2018  . On home O2   . CO2 narcosis   . Pulmonary edema   . Elevated brain natriuretic peptide (BNP) level   . Orthostatic hypotension   . Hematochezia 12/06/2018  . Rectal bleeding 12/05/2018  . Aspiration pneumonia ([place]) 11/19/2018  . Dysphagia 11/19/2018  . Acute respiratory failure with hypoxia and hypercapnia ([place]) 11/16/2018  . Chronic obstructive bronchitis ([place])   . Dementia with behavioral disturbance ([place])   . Goals of care, counseling/discussion   . Advanced care planning/counseling discussion   . Palliative care by specialist   . Bronchiectasis with acute exacerbation ([place])   . COPD exacerbation ([place]) 10/18/2018  . Anemia 10/18/2018  . Osteoporosis 08/30/2018  . Closed compression fracture of body of L1 vertebra ([place])   . Constipation   . Acute and chronic respiratory failure (acute-on-chronic) ([place]) 08/10/2018  . Left lower lobe pneumonia 08/10/2018  . Leukocytosis 08/10/2018  . Chronic respiratory failure with hypoxia ([place]) 06/28/2018  . Acute upper GI bleeding 01/20/2018  . Lower GI bleed 01/20/2018  . Acute respiratory failure with hypoxia ([place]) 09/17/2017  . Diabetes mellitus ([place]) 08/31/2017  . Acute hypoxemic respiratory failure ([place]) 08/31/2017  . Hemorrhoids 10/30/2016  . Atrial fibrillation with RVR ([place]) 09/24/2016  . Atrial flutter ([place]) 09/23/2016  . Obstructive chronic bronchitis with exacerbation ([place]) 05/23/2016  . Vitamin D deficiency 05/23/2016  . Acute on chronic respiratory failure ([place]) 03/24/2015  . Metabolic encephalopathy [id number]  . HCAP (healthcare-associated pneumonia) 03/24/2015  . Anticoagulation management encounter   . New  onset atrial flutter ([place]) 03/21/2015  . Obesity 03/21/2015  . Atrial flutter with rapid ventricular response ([place]) 03/21/2015  . COPD (chronic obstructive pulmonary disease) ([place]) 12/20/2014  . COPD with acute exacerbation ([place]) 12/20/2014  . 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 Breast cancer ([place]) 10/11/2009   PCP:  Lavella Lemons, PA Pharmacy:   [facility] [place] [address] [place] [address] Phone: [phone] Fax: [phone]     Social Determinants of Health (SDOH) Interventions    Readmission Risk Interventions Readmission Risk Prevention Plan 12/11/2018 12/06/2018 11/20/2018  Transportation Screening - Complete -  PCP or Specialist Appt within 5-7 Days - - -  Home Care Screening - - -  Medication Review (RN CM) - - -  Medication Review (RN Care Manager) - Complete -  PCP or Specialist appointment within 3-5 days of discharge - Not Complete -  [place] or Home Care Consult - Complete -  SW Recovery Care/Counseling Consult - Complete -  Palliative Care Screening - Not Complete Complete  Comments - - -  [place] Not Applicable Not Complete -  Some recent data might be hidden

## 2018-12-27 NOTE — Progress Notes (Signed)
At 0814hr RT removed patient from BIPAP and gave a breathing treatment, patient appears to be tolerating transition well. Will place patient on 4L O2 and continue to monitor and assess.

## 2018-12-27 NOTE — Plan of Care (Signed)
  Problem: Acute Rehab PT Goals(only PT should resolve) Goal: Pt Will Go Supine/Side To Sit Outcome: Progressing Flowsheets (Taken 12/27/2018 1537) Pt will go Supine/Side to Sit: with modified independence Goal: Patient Will Transfer Sit To/From Stand Outcome: Progressing Flowsheets (Taken 12/27/2018 1537) Patient will transfer sit to/from stand: with min guard assist Goal: Pt Will Transfer Bed To Chair/Chair To Bed Outcome: Progressing Flowsheets (Taken 12/27/2018 1537) Pt will Transfer Bed to Chair/Chair to Bed: min guard assist Goal: Pt Will Ambulate Outcome: Progressing Flowsheets (Taken 12/27/2018 1537) Pt will Ambulate:  25 feet  with minimal assist  with rolling walker   3:38 PM, 12/27/18 Lonell Grandchild, MPT Physical Therapist with [facility] [phone] office [phone] mobile phone

## 2018-12-27 NOTE — Consult Note (Signed)
Consult requested by: [facility] hospitalist, Dr. Wynetta Emery Consult requested for: Respiratory failure  HPI: This is a 73 year old who is known to have COPD, chronic hypoxic respiratory failure, atrial flutter, diastolic heart failure dementia and personal history of breast cancer.  She came to the emergency room because of increasing shortness of breath and mental status changes.  When she was evaluated by paramedics she had oxygen saturation in the 80s and she was nearly obtunded.  There was some question as to whether her oxygen concentrator is actually working and she also had 50 foot tubing in place.  According to medical record she is had cough chest congestion weak and has fallen at home injuring her right ankle.  She is on BiPAP now and history is obtained from the medical record because of that.  When she arrived in the emergency department she was obtunded with oxygen saturations in the 70s to 80s significant amount of upper airway rhonchi and had rapid response.  She was placed on BiPAP and remains on BiPAP now.  Chest x-ray that I personally reviewed shows what looks like some possible pulmonary edema and some increased infiltrate in the right base presumably pneumonia.  She is able to talk to me this morning but she is confused  Past Medical History:  Diagnosis Date  . Asthma   . Atrial flutter ([place])   . Cancer [facility])    Right breast  . COPD (chronic obstructive pulmonary disease) ([place])   . Dementia ([place])   . Diverticulosis   . Hemorrhoids   . History of breast cancer    right breast  . Hypertension   . On home O2   . Type 2 diabetes mellitus (HCC)      Family History  Problem Relation Age of Onset  . Cancer Mother        lung  . Diabetes Brother      Social History   Socioeconomic History  . Marital status: Single    Spouse name: Not on file  . Number of children: Not on file  . Years of education: Not on file  . Highest education level: Not on file  Occupational History   . Not on file  Social Needs  . Financial resource strain: Not hard at all  . Food insecurity    Worry: Never true    Inability: Never true  . Transportation needs    Medical: Patient refused    Non-medical: Patient refused  Tobacco Use  . Smoking status: Former Smoker    Packs/day: 0.50    Years: 32.00    Pack years: 16.00    Types: Cigarettes    Start date: 12/12/1962    Quit date: 03/13/1994    Years since quitting: 24.8  . Smokeless tobacco: Never Used  Substance and Sexual Activity  . Alcohol use: No    Alcohol/week: 0.0 standard drinks  . Drug use: No  . Sexual activity: Not on file  Lifestyle  . Physical activity    Days per week: Patient refused    Minutes per session: Patient refused  . Stress: Not at all  Relationships  . Social Herbalist on phone: Three times a week    Gets together: Three times a week    Attends religious service: More than 4 times per year    Active member of club or organization: Yes    Attends meetings of clubs or organizations: More than 4 times per year    Relationship  status: Never married  Other Topics Concern  . Not on file  Social History Narrative  . Not on file     ROS: Unobtainable    Objective: Vital signs in last 24 hours: Temp:  [98.5 F (36.9 C)] 98.5 F (36.9 C) (10/15 1030) Pulse Rate:  [79-117] 85 (10/16 0300) Resp:  [10-36] 23 (10/16 0300) BP: (94-125)/(48-81) 102/52 (10/16 0300) SpO2:  [84 %-100 %] 90 % (10/16 0300) FiO2 (%):  [50 %] 50 % (10/16 0255) Weight:  [67 kg-67.5 kg] 67.5 kg (10/16 0500) Weight change:     Intake/Output from previous day: 10/15 0701 - 10/16 0700 In: 516.1 [I.V.:111.9; IV Piggyback:404.2] Out: 400 [Urine:400]  PHYSICAL EXAM Constitutional: She is awake but confused.  She is on BiPAP.  She looks fairly comfortable.  Eyes: Pupils react ears nose mouth and throat: Limited exam because of the BiPAP.  Hearing is grossly normal.  When I conceive her throat is clear.   Cardiovascular: Her heart is regular respiratory: Respiratory effort per the BiPAP.  She has some bilateral rhonchi.:  Gastrointestinal: Her abdomen soft without masses.  Musculoskeletal: Grossly normal strength.  Neurological: She is confused.  Psychiatric: She is anxious  Lab Results: Basic Metabolic Panel: Recent Labs    12/26/18 1041 12/27/18 0438  NA 141 143  K 4.3 4.5  CL 87* 93*  CO2 45* 39*  GLUCOSE 225* 283*  BUN 8 10  CREATININE 0.52 0.59  CALCIUM 9.8 8.8*  MG  --  2.0   Liver Function Tests: Recent Labs    12/26/18 1041 12/27/18 0438  AST 17 12*  ALT 15 15  ALKPHOS 80 66  BILITOT 0.6 0.6  PROT 6.5 5.7*  ALBUMIN 3.3* 2.9*   No results for input(s): LIPASE, AMYLASE in the last 72 hours. Recent Labs    12/26/18 1043  AMMONIA 24   CBC: Recent Labs    12/26/18 1041 12/27/18 0438  WBC 12.6* 10.4  NEUTROABS 10.4* 9.3*  HGB 9.0* 7.7*  HCT 33.4* 28.8*  MCV 99.7 99.0  PLT 318 277   Cardiac Enzymes: No results for input(s): CKTOTAL, CKMB, CKMBINDEX, TROPONINI in the last 72 hours. BNP: No results for input(s): PROBNP in the last 72 hours. D-Dimer: No results for input(s): DDIMER in the last 72 hours. CBG: Recent Labs    12/26/18 2218 12/27/18 0356  GLUCAP 251* 271*   Hemoglobin A1C: No results for input(s): HGBA1C in the last 72 hours. Fasting Lipid Panel: No results for input(s): CHOL, HDL, LDLCALC, TRIG, CHOLHDL, LDLDIRECT in the last 72 hours. Thyroid Function Tests: No results for input(s): TSH, T4TOTAL, FREET4, T3FREE, THYROIDAB in the last 72 hours. Anemia Panel: No results for input(s): VITAMINB12, FOLATE, FERRITIN, TIBC, IRON, RETICCTPCT in the last 72 hours. Coagulation: Recent Labs    12/26/18 1212  LABPROT 13.2  INR 1.0   Urine Drug Screen: Drugs of Abuse  No results found for: LABOPIA, COCAINSCRNUR, LABBENZ, AMPHETMU, THCU, LABBARB  Alcohol Level: No results for input(s): ETH in the last 72 hours. Urinalysis: Recent Labs     12/26/18 1042  COLORURINE YELLOW  LABSPEC 1.015  PHURINE 5.0  GLUCOSEU >=500*  HGBUR NEGATIVE  BILIRUBINUR NEGATIVE  KETONESUR 5*  PROTEINUR 30*  NITRITE POSITIVE*  LEUKOCYTESUR NEGATIVE   Misc. Labs:   ABGS: Recent Labs    12/26/18 1041  PHART 7.363  PO2ART 55.2*  HCO3 41.7*     MICROBIOLOGY: Recent Results (from the past 240 hour(s))  SARS Coronavirus 2 by RT  PCR (hospital order, performed in [facility] hospital lab) Nasopharyngeal Nasopharyngeal Swab     Status: None   Collection Time: 12/26/18 10:46 AM   Specimen: Nasopharyngeal Swab  Result Value Ref Range Status   SARS Coronavirus 2 NEGATIVE NEGATIVE Final    Comment: (NOTE) If result is NEGATIVE SARS-CoV-2 target nucleic acids are NOT DETECTED. The SARS-CoV-2 RNA is generally detectable in upper and lower  respiratory specimens during the acute phase of infection. The lowest  concentration of SARS-CoV-2 viral copies this assay can detect is 250  copies / mL. A negative result does not preclude SARS-CoV-2 infection  and should not be used as the sole basis for treatment or other  patient management decisions.  A negative result may occur with  improper specimen collection / handling, submission of specimen other  than nasopharyngeal swab, presence of viral mutation(s) within the  areas targeted by this assay, and inadequate number of viral copies  (<250 copies / mL). A negative result must be combined with clinical  observations, patient history, and epidemiological information. If result is POSITIVE SARS-CoV-2 target nucleic acids are DETECTED. The SARS-CoV-2 RNA is generally detectable in upper and lower  respiratory specimens dur ing the acute phase of infection.  Positive  results are indicative of active infection with SARS-CoV-2.  Clinical  correlation with patient history and other diagnostic information is  necessary to determine patient infection status.  Positive results do  not rule out  bacterial infection or co-infection with other viruses. If result is PRESUMPTIVE POSTIVE SARS-CoV-2 nucleic acids MAY BE PRESENT.   A presumptive positive result was obtained on the submitted specimen  and confirmed on repeat testing.  While 2019 novel coronavirus  (SARS-CoV-2) nucleic acids may be present in the submitted sample  additional confirmatory testing may be necessary for epidemiological  and / or clinical management purposes  to differentiate between  SARS-CoV-2 and other Sarbecovirus currently known to infect humans.  If clinically indicated additional testing with an alternate test  methodology [phone]) is advised. The SARS-CoV-2 RNA is generally  detectable in upper and lower respiratory sp ecimens during the acute  phase of infection. The expected result is Negative. Fact Sheet for Patients:  StrictlyIdeas.no Fact Sheet for Healthcare Providers: BankingDealers.co.za This test is not yet approved or cleared by the [place] FDA and has been authorized for detection and/or diagnosis of SARS-CoV-2 by FDA under an Emergency Use Authorization (EUA).  This EUA will remain in effect (meaning this test can be used) for the duration of the COVID-19 declaration under Section 564(b)(1) of the Act, 21 U.S.C. section 360bbb-3(b)(1), unless the authorization is terminated or revoked sooner. Performed at [facility], [address], [place], [place] [address]   Blood Culture (routine x 2)     Status: None (Preliminary result)   Collection Time: 12/26/18 12:12 PM   Specimen: BLOOD LEFT HAND  Result Value Ref Range Status   Specimen Description BLOOD LEFT HAND  Final   Special Requests   Final    BOTTLES DRAWN AEROBIC AND ANAEROBIC Blood Culture results may not be optimal due to an inadequate volume of blood received in culture bottles Performed at [facility], [address], [place], [place] [address]    Culture PENDING   Incomplete   Report Status PENDING  Incomplete  Blood Culture (routine x 2)     Status: None (Preliminary result)   Collection Time: 12/26/18 12:12 PM   Specimen: BLOOD LEFT HAND  Result Value Ref Range  Status   Specimen Description BLOOD LEFT HAND  Final   Special Requests   Final    BOTTLES DRAWN AEROBIC AND ANAEROBIC Blood Culture results may not be optimal due to an inadequate volume of blood received in culture bottles Performed at [facility], [address], [place], [place] [address]    Culture PENDING  Incomplete   Report Status PENDING  Incomplete  MRSA PCR Screening     Status: None   Collection Time: 12/26/18  8:18 PM   Specimen: Nasal Mucosa; Nasopharyngeal  Result Value Ref Range Status   MRSA by PCR NEGATIVE NEGATIVE Final    Comment:        The GeneXpert MRSA Assay (FDA approved for NASAL specimens only), is one component of a comprehensive MRSA colonization surveillance program. It is not intended to diagnose MRSA infection nor to guide or monitor treatment for MRSA infections. Performed at [facility], [address], [place], [place] [address]     Studies/Results: Dg Ankle 2 Views Right  Result Date: 12/26/2018 CLINICAL DATA:  Right ankle pain. EXAM: RIGHT ANKLE - 2 VIEW COMPARISON:  No recent. FINDINGS: Diffuse soft tissue swelling. No acute bony abnormality identified. No evidence of fracture. Corticated bony densities noted adjacent to the mediolateral malleoli. These consistent old fracture fragments. IMPRESSION: 1.  Diffuse soft tissue swelling.  No acute bony abnormality. 2. Corticated bony densities noted adjacent to the medial and lateral malleoli consistent with old fracture fragments. Electronically Signed   By: Marcello Moores  Register   On: 12/26/2018 14:11   Dg Chest Port 1 View  Result Date: 12/27/2018 CLINICAL DATA:  Pulmonary edema EXAM: PORTABLE CHEST 1 VIEW COMPARISON:  Radiograph 12/26/2018 FINDINGS: Cardiomegaly with bilateral interstitial  opacities asymmetrically increased in the right lung with bilateral pleural effusions. More confluent opacity is present in the right lung base. Overall appearance is not significantly changed from 1 day prior. Stable cardiomegaly. No pneumothorax. IMPRESSION: Appearance not significantly changed from 1 day prior with features of pulmonary edema and bilateral effusions. More confluent opacity in the right base possibly reflecting atelectasis or pneumonia. Stable cardiomegaly. Electronically Signed   By: Lovena Le M.D.   On: 12/27/2018 04:21   Dg Chest Port 1 View  Result Date: 12/26/2018 CLINICAL DATA:  Generalized weakness and worsening shortness of breath with exertion. Status post fall yesterday. EXAM: PORTABLE CHEST 1 VIEW COMPARISON:  Single-view of the chest 12/08/2018 and 11/19/2018. FINDINGS: There is cardiomegaly and pulmonary edema with small bilateral pleural effusions. Right basilar airspace disease is seen. No pneumothorax. No acute bony abnormality. IMPRESSION: Pulmonary edema with associated small effusions, larger on the right. Right basilar airspace disease could be atelectasis or pneumonia. Electronically Signed   By: Inge Rise M.D.   On: 12/26/2018 11:32    Medications:  Prior to Admission:  Medications Prior to Admission  Medication Sig Dispense Refill Last Dose  . albuterol (PROVENTIL) (2.5 MG/3ML) 0.083% nebulizer solution Take 2.5 mg by nebulization every 6 (six) hours as needed for wheezing or shortness of breath.   unknown  . amiodarone (PACERONE) 200 MG tablet Take 200 mg by mouth daily.   unknown  . diltiazem (CARDIZEM CD) 180 MG 24 hr capsule Take 1 capsule (180 mg total) by mouth daily. 30 capsule 1 unknown  . furosemide (LASIX) 40 MG tablet Take 1 tablet (40 mg total) by mouth daily as needed for fluid. (Patient taking differently: Take 60 mg by mouth daily. )   unknown  . metFORMIN (GLUCOPHAGE-XR)  500 MG 24 hr tablet Take 2 tablets (1,000 mg total) by mouth 2  (two) times daily with a meal. 120 tablet 1 unknown  . metoprolol tartrate (LOPRESSOR) 25 MG tablet Take 1 tablet (25 mg total) by mouth 2 (two) times daily. 60 tablet 0 unknonw  . nitrofurantoin (MACRODANTIN) 100 MG capsule Take 100 mg by mouth 2 (two) times daily.   unknown  . potassium chloride SA (KLOR-CON) 20 MEQ tablet Take 1 tablet (20 mEq total) by mouth daily as needed (take only when taking lasix). (Patient taking differently: Take 20 mEq by mouth 2 (two) times daily. )   unknown  . predniSONE (DELTASONE) 20 MG tablet Take 2 PO QAM x5 days (Patient taking differently: Take 40 mg by mouth daily with breakfast. Take 2 PO QAM x5 days) 10 tablet 0 unknown  . QUEtiapine (SEROQUEL) 25 MG tablet Take 1 tablet (25 mg total) by mouth daily at 6 PM. 30 tablet 3 unknown  . traMADol (ULTRAM) 50 MG tablet Take 50 mg by mouth 3 (three) times daily.   unknown  . acetaminophen (TYLENOL) 500 MG tablet Take 500 mg by mouth every 8 (eight) hours as needed for mild pain or headache.    unknown  . rivaroxaban (XARELTO) 20 MG TABS tablet Take 1 tablet (20 mg total) by mouth daily with supper. Stop Xarelto until Monday (08/26/18) in anticipation for kyphoplasty (Patient not taking: Reported on 12/26/2018) 21 tablet 0 Not Taking at Unknown time   Scheduled: . budesonide (PULMICORT) nebulizer solution  0.25 mg Nebulization BID  . Chlorhexidine Gluconate Cloth  6 each Topical Daily  . diltiazem  180 mg Oral Daily  . furosemide  40 mg Intravenous Q12H  . insulin aspart  0-20 Units Subcutaneous Q4H  . insulin aspart  8 Units Subcutaneous TID WC  . insulin glargine  22 Units Subcutaneous Q24H  . ipratropium-albuterol  3 mL Nebulization Q4H  . methylPREDNISolone (SOLU-MEDROL) injection  60 mg Intravenous Q6H  . metoprolol tartrate  2.5 mg Intravenous Q6H  . pantoprazole (PROTONIX) IV  40 mg Intravenous Q24H  . QUEtiapine  25 mg Oral q1800  . rivaroxaban  20 mg Oral Q supper   Continuous: . sodium chloride 30  mL/hr at 12/27/18 0300  . azithromycin Stopped (12/26/18 1426)  . cefTRIAXone (ROCEPHIN)  IV Stopped (12/26/18 1259)   [id number] **OR** acetaminophen, haloperidol lactate, ondansetron **OR** ondansetron (ZOFRAN) IV, senna-docusate  Assesment: She has COPD with exacerbation and acute on chronic hypoxic and hypercapnic respiratory failure.  She has what is probably a right lower lobe pneumonia.  Since she was just in the hospital I think this should be considered a healthcare associated pneumonia and her antibiotics changed.  She has dementia which complicates her situation and she is confused this morning.  She has what looks like some pulmonary edema on her chest x-ray as well and that is being treated  She has episodes of becoming very confused and agitated and she has medication available for that Active Problems:   COPD (chronic obstructive pulmonary disease) (HCC)   COPD with acute exacerbation (HCC)   Acute on chronic respiratory failure ([place])   Diabetes mellitus ([place])   Chronic respiratory failure with hypoxia (HCC)   Leukocytosis   Breast cancer ([place])   Osteoporosis   Vitamin D deficiency   Anemia   Bronchiectasis with acute exacerbation ([place])   Dementia with behavioral disturbance ([place])   Acute respiratory failure with hypoxia and hypercapnia ([place])   On home  O2   CO2 narcosis   Acute and chronic respiratory failure with hypercapnia (HCC)   Pulmonary edema   Elevated brain natriuretic peptide (BNP) level    Plan: Continue IV steroids, inhaled bronchodilators.  Change antibiotics.  I would do Zofran with vancomycin because I think it is entirely possible that this is aspiration based on the fact that she was so obtunded    LOS: 1 day   Alonza Bogus 12/27/2018, 7:44 AM

## 2018-12-27 NOTE — Care Management Important Message (Signed)
Important Message  Patient Details  Name: Eileen [name] MRN: [id number] Date of Birth: August 26, 1945   Medicare Important Message Given:  Yes     [name] 12/27/2018, 3:38 PM

## 2018-12-27 NOTE — Progress Notes (Signed)
Palliative Medicine consult order noted. PMT provider is planned to return to [facility] on Monday 10/19. If the patient remains hospitalized, the consult will be evaluated at that time. If recommendations are needed in the interim, please call our office at [phone].   Marjie Skiff Mykaila Blunck, RN, BSN, [facility] Palliative Medicine Team 12/27/2018 7:47 AM Office [phone]

## 2018-12-27 NOTE — Progress Notes (Signed)
Lantus dose decreased to 11 units tonight per midlevel as CBG was 99.

## 2018-12-28 DIAGNOSIS — E1169 Type 2 diabetes mellitus with other specified complication: Secondary | ICD-10-CM

## 2018-12-28 DIAGNOSIS — E559 Vitamin D deficiency, unspecified: Secondary | ICD-10-CM

## 2018-12-28 LAB — CBC WITH DIFFERENTIAL/PLATELET
Abs Immature Granulocytes: 0.08 10*3/uL — ABNORMAL HIGH (ref 0.00–0.07)
Basophils Absolute: 0 10*3/uL (ref 0.0–0.1)
Basophils Relative: 0 %
Eosinophils Absolute: 0 10*3/uL (ref 0.0–0.5)
Eosinophils Relative: 0 %
HCT: 27.1 % — ABNORMAL LOW (ref 36.0–46.0)
Hemoglobin: 7.5 g/dL — ABNORMAL LOW (ref 12.0–15.0)
Immature Granulocytes: 1 %
Lymphocytes Relative: 5 %
Lymphs Abs: 0.7 10*3/uL (ref 0.7–4.0)
MCH: 27 pg (ref 26.0–34.0)
MCHC: 27.7 g/dL — ABNORMAL LOW (ref 30.0–36.0)
MCV: 97.5 fL (ref 80.0–100.0)
Monocytes Absolute: 0.5 10*3/uL (ref 0.1–1.0)
Monocytes Relative: 3 %
Neutro Abs: 14.3 10*3/uL — ABNORMAL HIGH (ref 1.7–7.7)
Neutrophils Relative %: 91 %
Platelets: 292 10*3/uL (ref 150–400)
RBC: 2.78 MIL/uL — ABNORMAL LOW (ref 3.87–5.11)
RDW: 15 % (ref 11.5–15.5)
WBC: 15.6 10*3/uL — ABNORMAL HIGH (ref 4.0–10.5)
nRBC: 0 % (ref 0.0–0.2)

## 2018-12-28 LAB — GLUCOSE, CAPILLARY
Glucose-Capillary: 117 mg/dL — ABNORMAL HIGH (ref 70–99)
Glucose-Capillary: 117 mg/dL — ABNORMAL HIGH (ref 70–99)
Glucose-Capillary: 118 mg/dL — ABNORMAL HIGH (ref 70–99)
Glucose-Capillary: 248 mg/dL — ABNORMAL HIGH (ref 70–99)
Glucose-Capillary: 282 mg/dL — ABNORMAL HIGH (ref 70–99)
Glucose-Capillary: 82 mg/dL (ref 70–99)

## 2018-12-28 LAB — COMPREHENSIVE METABOLIC PANEL
ALT: 14 U/L (ref 0–44)
AST: 13 U/L — ABNORMAL LOW (ref 15–41)
Albumin: 2.9 g/dL — ABNORMAL LOW (ref 3.5–5.0)
Alkaline Phosphatase: 58 U/L (ref 38–126)
Anion gap: 10 (ref 5–15)
BUN: 17 mg/dL (ref 8–23)
CO2: 43 mmol/L — ABNORMAL HIGH (ref 22–32)
Calcium: 8.6 mg/dL — ABNORMAL LOW (ref 8.9–10.3)
Chloride: 89 mmol/L — ABNORMAL LOW (ref 98–111)
Creatinine, Ser: 0.55 mg/dL (ref 0.44–1.00)
GFR calc Af Amer: >60 mL/min (ref >60)
GFR calc non Af Amer: >60 mL/min (ref >60)
Glucose, Bld: 115 mg/dL — ABNORMAL HIGH (ref 70–99)
Potassium: 4 mmol/L (ref 3.5–5.1)
Sodium: 142 mmol/L (ref 135–145)
Total Bilirubin: 0.5 mg/dL (ref 0.3–1.2)
Total Protein: 5.5 g/dL — ABNORMAL LOW (ref 6.5–8.1)

## 2018-12-28 LAB — BRAIN NATRIURETIC PEPTIDE: B Natriuretic Peptide: 347 pg/mL — ABNORMAL HIGH (ref 0.0–100.0)

## 2018-12-28 LAB — MAGNESIUM: Magnesium: 1.9 mg/dL (ref 1.7–2.4)

## 2018-12-28 MED ORDER — INSULIN ASPART 100 UNIT/ML ~~LOC~~ SOLN
3.0000 [IU] | Freq: Three times a day (TID) | SUBCUTANEOUS | Status: DC
  Administered 2018-12-28 – 2018-12-29 (×4): 3 [IU] via SUBCUTANEOUS

## 2018-12-28 MED ORDER — IPRATROPIUM-ALBUTEROL 0.5-2.5 (3) MG/3ML IN SOLN
3.0000 mL | Freq: Four times a day (QID) | RESPIRATORY_TRACT | Status: DC
  Administered 2018-12-28 – 2019-01-02 (×20): 3 mL via RESPIRATORY_TRACT
  Filled 2018-12-28 (×20): qty 3

## 2018-12-28 MED ORDER — INSULIN GLARGINE 100 UNIT/ML ~~LOC~~ SOLN
10.0000 [IU] | SUBCUTANEOUS | Status: DC
  Administered 2018-12-28 – 2018-12-29 (×2): 10 [IU] via SUBCUTANEOUS
  Filled 2018-12-28 (×2): qty 0.1

## 2018-12-28 MED ORDER — AMIODARONE HCL 200 MG PO TABS
200.0000 mg | ORAL_TABLET | Freq: Every day | ORAL | Status: DC
  Administered 2018-12-28 – 2019-01-02 (×6): 200 mg via ORAL
  Filled 2018-12-28 (×6): qty 1

## 2018-12-28 MED ORDER — METOPROLOL TARTRATE 25 MG PO TABS
25.0000 mg | ORAL_TABLET | Freq: Two times a day (BID) | ORAL | Status: DC
  Administered 2018-12-28 – 2019-01-02 (×10): 25 mg via ORAL
  Filled 2018-12-28 (×10): qty 1

## 2018-12-28 NOTE — Progress Notes (Signed)
Subjective: She says she feels much better.  She is on nasal cannula now.  No new complaints.  Objective: Vital signs in last 24 hours: Temp:  [97.4 F (36.3 C)-99 F (37.2 C)] 97.7 F (36.5 C) (10/17 0800) Pulse Rate:  [36-136] 94 (10/17 0900) Resp:  [11-27] 17 (10/17 0900) BP: (106-127)/(53-82) 113/64 (10/17 0900) SpO2:  [77 %-100 %] 87 % (10/17 0900) FiO2 (%):  [50 %] 50 % (10/16 2306) Weight:  [66.6 kg] 66.6 kg (10/17 0500) Weight change: -0.4 kg    Intake/Output from previous day: 10/16 0701 - 10/17 0700 In: 696.6 [P.O.:360; I.V.:36.6; IV Piggyback:300] Out: 1800 [Urine:1800]  PHYSICAL EXAM General appearance: alert, cooperative and no distress Resp: clear to auscultation bilaterally Cardio: regular rate and rhythm, S1, S2 normal, no murmur, click, rub or gallop GI: soft, non-tender; bowel sounds normal; no masses,  no organomegaly Extremities: extremities normal, atraumatic, no cyanosis or edema  Lab Results:  Results for orders placed or performed during the hospital encounter of 12/26/18 (from the past 48 hour(s))  Blood gas, arterial     Status: Abnormal   Collection Time: 12/26/18 10:41 AM  Result Value Ref Range   FIO2 44.00    pH, Arterial 7.363 7.350 - 7.450   pCO2 arterial 81.8 (HH) 32.0 - 48.0 mmHg    Comment: CRITICAL RESULT CALLED TO, READ BACK BY AND VERIFIED WITH: CRAWFORD,H AT 11:25AM ON 12/26/18 BY FESTERMAN,C    pO2, Arterial 55.2 (L) 83.0 - 108.0 mmHg   Bicarbonate 41.7 (H) 20.0 - 28.0 mmol/L   Acid-Base Excess 19.0 (H) 0.0 - 2.0 mmol/L   O2 Saturation 87.3 %   Patient temperature 37.0    Allens test (pass/fail) PASS PASS    Comment: Performed at [facility], [address] [place], [place] [address]  CBC with Differential/Platelet     Status: Abnormal   Collection Time: 12/26/18 10:41 AM  Result Value Ref Range   WBC 12.6 (H) 4.0 - 10.5 K/uL   RBC 3.35 (L) 3.87 - 5.11 MIL/uL   Hemoglobin 9.0 (L) 12.0 - 15.0 g/dL   HCT 33.4 (L) 36.0 -  46.0 %   MCV 99.7 80.0 - 100.0 fL   MCH 26.9 26.0 - 34.0 pg   MCHC 26.9 (L) 30.0 - 36.0 g/dL   RDW 15.3 11.5 - 15.5 %   Platelets 318 150 - 400 K/uL   nRBC 0.3 (H) 0.0 - 0.2 %   Neutrophils Relative % 83 %   Neutro Abs 10.4 (H) 1.7 - 7.7 K/uL   Lymphocytes Relative 9 %   Lymphs Abs 1.1 0.7 - 4.0 K/uL   Monocytes Relative 7 %   Monocytes Absolute 0.9 0.1 - 1.0 K/uL   Eosinophils Relative 0 %   Eosinophils Absolute 0.0 0.0 - 0.5 K/uL   Basophils Relative 0 %   Basophils Absolute 0.0 0.0 - 0.1 K/uL   Immature Granulocytes 1 %   Abs Immature Granulocytes 0.12 (H) 0.00 - 0.07 K/uL    Comment: Performed at [facility], [address] [place], [place] [address]  Comprehensive metabolic panel     Status: Abnormal   Collection Time: 12/26/18 10:41 AM  Result Value Ref Range   Sodium 141 135 - 145 mmol/L   Potassium 4.3 3.5 - 5.1 mmol/L   Chloride 87 (L) 98 - 111 mmol/L   CO2 45 (H) 22 - 32 mmol/L   Glucose, Bld 225 (H) 70 - 99 mg/dL   BUN 8 8 - 23  mg/dL   Creatinine, Ser 0.52 0.44 - 1.00 mg/dL   Calcium 9.8 8.9 - 10.3 mg/dL   Total Protein 6.5 6.5 - 8.1 g/dL   Albumin 3.3 (L) 3.5 - 5.0 g/dL   AST 17 15 - 41 U/L   ALT 15 0 - 44 U/L   Alkaline Phosphatase 80 38 - 126 U/L   Total Bilirubin 0.6 0.3 - 1.2 mg/dL   GFR calc non Af Amer >60 >60 mL/min   GFR calc Af Amer >60 >60 mL/min   Anion gap 9 5 - 15    Comment: Performed at [facility], [address] [place], [place] [address]  Troponin I (High Sensitivity)     Status: None   Collection Time: 12/26/18 10:41 AM  Result Value Ref Range   Troponin I (High Sensitivity) 10 <18 ng/L    Comment: (NOTE) Elevated high sensitivity troponin I (hsTnI) values and significant  changes across serial measurements may suggest ACS but many other  chronic and acute conditions are known to elevate hsTnI results.  Refer to the "Links" section for chest pain algorithms and additional  guidance. Performed at [facility], [address],  [place], [place] [address]   Urinalysis, Routine w reflex microscopic     Status: Abnormal   Collection Time: 12/26/18 10:42 AM  Result Value Ref Range   Color, Urine YELLOW YELLOW   APPearance HAZY (A) CLEAR   Specific Gravity, Urine 1.015 1.005 - 1.030   pH 5.0 5.0 - 8.0   Glucose, UA >=500 (A) NEGATIVE mg/dL   Hgb urine dipstick NEGATIVE NEGATIVE   Bilirubin Urine NEGATIVE NEGATIVE   Ketones, ur 5 (A) NEGATIVE mg/dL   Protein, ur 30 (A) NEGATIVE mg/dL   Nitrite POSITIVE (A) NEGATIVE   Leukocytes,Ua NEGATIVE NEGATIVE   WBC, UA 0-5 0 - 5 WBC/hpf   Bacteria, UA RARE (A) NONE SEEN   Squamous Epithelial / LPF 0-5 0 - 5   Mucus PRESENT    Hyaline Casts, UA PRESENT     Comment: Performed at [facility], [address] [place], [place] [address]  Urine Culture     Status: Abnormal (Preliminary result)   Collection Time: 12/26/18 10:43 AM   Specimen: Urine, Catheterized  Result Value Ref Range   Specimen Description      URINE, CATHETERIZED Performed at [facility], [address] [place], [place] [address]    Special Requests      NONE Performed at [facility], [address], [place], [place] [address]    Culture (A)     >=100,000 COLONIES/mL GRAM NEGATIVE RODS IDENTIFICATION AND SUSCEPTIBILITIES TO FOLLOW Performed at [facility] [address], [place], [place] [address]    Report Status PENDING   Ammonia     Status: None   Collection Time: 12/26/18 10:43 AM  Result Value Ref Range   Ammonia 24 9 - 35 umol/L    Comment: Performed at [facility], [address], [place], [place] [address]  Brain natriuretic peptide     Status: Abnormal   Collection Time: 12/26/18 10:44 AM  Result Value Ref Range   B Natriuretic Peptide 339.0 (H) 0.0 - 100.0 pg/mL    Comment: Performed at [facility], [address], [place], [place] [address]  SARS Coronavirus 2 by RT PCR (hospital order, performed in [facility] hospital lab) Nasopharyngeal Nasopharyngeal Swab     Status: None    Collection Time: 12/26/18 10:46 AM   Specimen: Nasopharyngeal Swab  Result Value Ref Range   SARS Coronavirus 2 NEGATIVE NEGATIVE    Comment: (NOTE) If result is NEGATIVE SARS-CoV-2 target nucleic acids are NOT DETECTED. The SARS-CoV-2 RNA is generally detectable in upper and lower  respiratory specimens during the acute phase of infection. The lowest  concentration of SARS-CoV-2 viral copies this assay can detect is 250  copies / mL. A negative result does not preclude SARS-CoV-2 infection  and should not be used as the sole basis for treatment or other  patient management decisions.  A negative result may occur with  improper specimen collection / handling, submission of specimen other  than nasopharyngeal swab, presence of viral mutation(s) within the  areas targeted by this assay, and inadequate number of viral copies  (<250 copies / mL). A negative result must be combined with clinical  observations, patient history, and epidemiological information. If result is POSITIVE SARS-CoV-2 target nucleic acids are DETECTED. The SARS-CoV-2 RNA is generally detectable in upper and lower  respiratory specimens dur ing the acute phase of infection.  Positive  results are indicative of active infection with SARS-CoV-2.  Clinical  correlation with patient history and other diagnostic information is  necessary to determine patient infection status.  Positive results do  not rule out bacterial infection or co-infection with other viruses. If result is PRESUMPTIVE POSTIVE SARS-CoV-2 nucleic acids MAY BE PRESENT.   A presumptive positive result was obtained on the submitted specimen  and confirmed on repeat testing.  While 2019 novel coronavirus  (SARS-CoV-2) nucleic acids may be present in the submitted sample  additional confirmatory testing may be necessary for epidemiological  and / or clinical management purposes  to differentiate between  SARS-CoV-2 and other Sarbecovirus currently known  to infect humans.  If clinically indicated additional testing with an alternate test  methodology [phone]) is advised. The SARS-CoV-2 RNA is generally  detectable in upper and lower respiratory sp ecimens during the acute  phase of infection. The expected result is Negative. Fact Sheet for Patients:  StrictlyIdeas.no Fact Sheet for Healthcare Providers: BankingDealers.co.za This test is not yet approved or cleared by the [place] FDA and has been authorized for detection and/or diagnosis of SARS-CoV-2 by FDA under an Emergency Use Authorization (EUA).  This EUA will remain in effect (meaning this test can be used) for the duration of the COVID-19 declaration under Section 564(b)(1) of the Act, 21 U.S.C. section 360bbb-3(b)(1), unless the authorization is terminated or revoked sooner. Performed at [facility], [address], [place], [place] [address]   Lactic acid, plasma     Status: Abnormal   Collection Time: 12/26/18 12:12 PM  Result Value Ref Range   Lactic Acid, Venous 2.0 (HH) 0.5 - 1.9 mmol/L    Comment: CRITICAL RESULT CALLED TO, READ BACK BY AND VERIFIED WITH: CARWFORD,H AT 1318 ON 10.15.20 BY ISLEY,B Performed at [facility], [address], [place], [place] [address]   APTT     Status: None   Collection Time: 12/26/18 12:12 PM  Result Value Ref Range   aPTT 29 24 - 36 seconds    Comment: Performed at [facility], [address], [place], [place] [address]  Protime-INR     Status: None   Collection Time: 12/26/18 12:12 PM  Result Value Ref Range   Prothrombin Time 13.2 11.4 - 15.2 seconds   INR 1.0 0.8 - 1.2    Comment: (NOTE) INR goal varies based on device and disease states. Performed at [facility], [place]  [address], [place], [place] [address]   Blood Culture (routine x 2)     Status: None (Preliminary result)   Collection Time: 12/26/18 12:12 PM   Specimen: BLOOD LEFT HAND  Result Value Ref Range   Specimen  Description BLOOD LEFT HAND    Special Requests      BOTTLES DRAWN AEROBIC AND ANAEROBIC Blood Culture results may not be optimal due to an inadequate volume of blood received in culture bottles   Culture      NO GROWTH 2 DAYS Performed at [facility], [address] [place], [place] [address]    Report Status PENDING   Blood Culture (routine x 2)     Status: None (Preliminary result)   Collection Time: 12/26/18 12:12 PM   Specimen: BLOOD LEFT HAND  Result Value Ref Range   Specimen Description BLOOD LEFT HAND    Special Requests      BOTTLES DRAWN AEROBIC AND ANAEROBIC Blood Culture results may not be optimal due to an inadequate volume of blood received in culture bottles   Culture      NO GROWTH 2 DAYS Performed at [facility], [address], [place], [place] [address]    Report Status PENDING   Lactic acid, plasma     Status: None   Collection Time: 12/26/18  2:04 PM  Result Value Ref Range   Lactic Acid, Venous 1.7 0.5 - 1.9 mmol/L    Comment: Performed at [facility], [address] [place], [place] [address]  MRSA PCR Screening     Status: None   Collection Time: 12/26/18  8:18 PM   Specimen: Nasal Mucosa; Nasopharyngeal  Result Value Ref Range   MRSA by PCR NEGATIVE NEGATIVE    Comment:        The GeneXpert MRSA Assay (FDA approved for NASAL specimens only), is one component of a comprehensive MRSA colonization surveillance program. It is not intended to diagnose MRSA infection nor to guide or monitor treatment for MRSA infections. Performed at [facility], [address] [place],  [address]   Glucose, capillary     Status: Abnormal   Collection Time: 12/26/18 10:18 PM  Result Value Ref Range   Glucose-Capillary 251 (H) 70 - 99 mg/dL  Glucose, capillary     Status: Abnormal   Collection Time: 12/27/18  3:56 AM  Result Value Ref Range   Glucose-Capillary 271 (H) 70 - 99 mg/dL  Comprehensive metabolic panel     Status: Abnormal   Collection Time:  12/27/18  4:38 AM  Result Value Ref Range   Sodium 143 135 - 145 mmol/L   Potassium 4.5 3.5 - 5.1 mmol/L   Chloride 93 (L) 98 - 111 mmol/L   CO2 39 (H) 22 - 32 mmol/L   Glucose, Bld 283 (H) 70 - 99 mg/dL   BUN 10 8 - 23 mg/dL   Creatinine, Ser 0.59 0.44 - 1.00 mg/dL   Calcium 8.8 (L) 8.9 - 10.3 mg/dL   Total Protein 5.7 (L) 6.5 - 8.1 g/dL   Albumin 2.9 (L) 3.5 - 5.0 g/dL   AST 12 (L) 15 - 41 U/L   ALT 15 0 - 44 U/L   Alkaline Phosphatase 66 38 - 126 U/L   Total Bilirubin 0.6 0.3 - 1.2 mg/dL   GFR calc non Af Amer >60 >60 mL/min   GFR calc Af Amer >60 >60 mL/min   Anion gap 11 5 - 15    Comment: Performed at [facility], [address], [place], [place] [address]  Magnesium     Status: None   Collection Time: 12/27/18  4:38 AM  Result Value Ref Range   Magnesium 2.0 1.7 - 2.4 mg/dL    Comment: Performed at [facility], [address] [place], [place] [address]  CBC WITH DIFFERENTIAL     Status: Abnormal   Collection Time: 12/27/18  4:38 AM  Result Value Ref Range   WBC 10.4 4.0 - 10.5 K/uL   RBC 2.91 (L) 3.87 - 5.11 MIL/uL   Hemoglobin 7.7 (L) 12.0 - 15.0 g/dL   HCT 28.8 (L) 36.0 - 46.0 %   MCV 99.0 80.0 - 100.0 fL   MCH 26.5 26.0 - 34.0 pg   MCHC 26.7 (L) 30.0 - 36.0 g/dL   RDW 15.1 11.5 - 15.5 %   Platelets 277 150 - 400 K/uL   nRBC 0.2 0.0 - 0.2 %   Neutrophils Relative % 89 %   Neutro Abs 9.3 (H) 1.7 - 7.7 K/uL   Lymphocytes Relative 6 %   Lymphs Abs 0.7 0.7 - 4.0 K/uL   Monocytes Relative 4 %   Monocytes Absolute 0.4 0.1 - 1.0 K/uL   Eosinophils Relative 0 %   Eosinophils Absolute 0.0 0.0 - 0.5 K/uL   Basophils Relative 0 %   Basophils Absolute 0.0 0.0 - 0.1 K/uL   Immature Granulocytes 1 %   Abs Immature Granulocytes 0.06 0.00 - 0.07 K/uL    Comment: Performed at [facility], [address] [place], [place] [address]  Brain natriuretic peptide     Status: Abnormal   Collection Time: 12/27/18  4:38 AM  Result Value Ref Range   B Natriuretic Peptide 353.0  (H) 0.0 - 100.0 pg/mL    Comment: Performed at [facility], [address] [place], [place] [address]  Glucose, capillary     Status: Abnormal   Collection Time: 12/27/18  7:55 AM  Result Value Ref Range   Glucose-Capillary 230 (H) 70 - 99 mg/dL  Glucose, capillary     Status: Abnormal   Collection Time: 12/27/18 11:38 AM  Result Value Ref Range   Glucose-Capillary 119 (H) 70 - 99 mg/dL  Glucose, capillary     Status: Abnormal   Collection Time: 12/27/18  4:14 PM  Result Value Ref Range   Glucose-Capillary 280 (H) 70 - 99 mg/dL  Glucose, capillary     Status: None   Collection Time: 12/27/18  7:56 PM  Result Value Ref Range   Glucose-Capillary 99 70 - 99 mg/dL   Comment 1 Notify RN    Comment 2 Document in Chart   Glucose, capillary     Status: None   Collection Time: 12/28/18 12:29 AM  Result Value Ref Range   Glucose-Capillary 82 70 - 99 mg/dL  Glucose, capillary     Status: Abnormal   Collection Time: 12/28/18  4:44 AM  Result Value Ref Range   Glucose-Capillary 117 (H) 70 - 99 mg/dL  Comprehensive metabolic panel     Status: Abnormal   Collection Time: 12/28/18  4:54 AM  Result Value Ref Range   Sodium 142 135 - 145 mmol/L   Potassium 4.0 3.5 - 5.1 mmol/L   Chloride 89 (L) 98 - 111 mmol/L   CO2 43 (H) 22 - 32 mmol/L   Glucose, Bld 115 (H) 70 - 99 mg/dL   BUN 17 8 - 23 mg/dL   Creatinine, Ser 0.55 0.44 - 1.00 mg/dL   Calcium 8.6 (L)  8.9 - 10.3 mg/dL   Total Protein 5.5 (L) 6.5 - 8.1 g/dL   Albumin 2.9 (L) 3.5 - 5.0 g/dL   AST 13 (L) 15 - 41 U/L   ALT 14 0 - 44 U/L   Alkaline Phosphatase 58 38 - 126 U/L   Total Bilirubin 0.5 0.3 - 1.2 mg/dL   GFR calc non Af Amer >60 >60 mL/min   GFR calc Af Amer >60 >60 mL/min   Anion gap 10 5 - 15    Comment: Performed at [facility], [address] [place], [place] [address]  Magnesium     Status: None   Collection Time: 12/28/18  4:54 AM  Result Value Ref Range   Magnesium 1.9 1.7 - 2.4 mg/dL    Comment: Performed at [facility], [address] [place], [place] [address]  CBC WITH DIFFERENTIAL     Status: Abnormal   Collection Time: 12/28/18  4:54 AM  Result Value Ref Range   WBC 15.6 (H) 4.0 - 10.5 K/uL   RBC 2.78 (L) 3.87 - 5.11 MIL/uL   Hemoglobin 7.5 (L) 12.0 - 15.0 g/dL   HCT 27.1 (L) 36.0 - 46.0 %   MCV 97.5 80.0 - 100.0 fL   MCH 27.0 26.0 - 34.0 pg   MCHC 27.7 (L) 30.0 - 36.0 g/dL   RDW 15.0 11.5 - 15.5 %   Platelets 292 150 - 400 K/uL   nRBC 0.0 0.0 - 0.2 %   Neutrophils Relative % 91 %   Neutro Abs 14.3 (H) 1.7 - 7.7 K/uL   Lymphocytes Relative 5 %   Lymphs Abs 0.7 0.7 - 4.0 K/uL   Monocytes Relative 3 %   Monocytes Absolute 0.5 0.1 - 1.0 K/uL   Eosinophils Relative 0 %   Eosinophils Absolute 0.0 0.0 - 0.5 K/uL   Basophils Relative 0 %   Basophils Absolute 0.0 0.0 - 0.1 K/uL   Immature Granulocytes 1 %   Abs Immature Granulocytes 0.08 (H) 0.00 - 0.07 K/uL    Comment: Performed at [facility], [address] [place], [place] [address]  Brain natriuretic peptide     Status: Abnormal   Collection Time: 12/28/18  4:54 AM  Result Value Ref Range   B Natriuretic Peptide 347.0 (H) 0.0 - 100.0 pg/mL    Comment: Performed at [facility], [address], [place], [place] [address]  Glucose, capillary     Status: Abnormal   Collection Time: 12/28/18  7:41 AM  Result Value Ref Range   Glucose-Capillary 117 (H) 70 - 99 mg/dL    ABGS Recent Labs    12/26/18 1041  PHART 7.363  PO2ART 55.2*  HCO3 41.7*   CULTURES Recent Results (from the past 240 hour(s))  Urine Culture     Status: Abnormal (Preliminary result)   Collection Time: 12/26/18 10:43 AM   Specimen: Urine, Catheterized  Result Value Ref Range Status   Specimen Description   Final    URINE, CATHETERIZED Performed at [facility], [address], [place], [place] [address]    Special Requests   Final    NONE Performed at [facility], [address] [place], [place] [address]    Culture (A)  Final    >=100,000 COLONIES/mL  GRAM NEGATIVE RODS IDENTIFICATION AND SUSCEPTIBILITIES TO FOLLOW Performed at [facility], [address] [place],  [address]    Report Status PENDING  Incomplete  SARS Coronavirus 2 by RT PCR (hospital order, performed in  [facility] hospital lab) Nasopharyngeal Nasopharyngeal Swab     Status: None   Collection Time: 12/26/18 10:46 AM   Specimen: Nasopharyngeal Swab  Result Value Ref Range Status   SARS Coronavirus 2 NEGATIVE NEGATIVE Final    Comment: (NOTE) If result is NEGATIVE SARS-CoV-2 target nucleic acids are NOT DETECTED. The SARS-CoV-2 RNA is generally detectable in upper and lower  respiratory specimens during the acute phase of infection. The lowest  concentration of SARS-CoV-2 viral copies this assay can detect is 250  copies / mL. A negative result does not preclude SARS-CoV-2 infection  and should not be used as the sole basis for treatment or other  patient management decisions.  A negative result may occur with  improper specimen collection / handling, submission of specimen other  than nasopharyngeal swab, presence of viral mutation(s) within the  areas targeted by this assay, and inadequate number of viral copies  (<250 copies / mL). A negative result must be combined with clinical  observations, patient history, and epidemiological information. If result is POSITIVE SARS-CoV-2 target nucleic acids are DETECTED. The SARS-CoV-2 RNA is generally detectable in upper and lower  respiratory specimens dur ing the acute phase of infection.  Positive  results are indicative of active infection with SARS-CoV-2.  Clinical  correlation with patient history and other diagnostic information is  necessary to determine patient infection status.  Positive results do  not rule out bacterial infection or co-infection with other viruses. If result is PRESUMPTIVE POSTIVE SARS-CoV-2 nucleic acids MAY BE PRESENT.   A presumptive positive result was obtained on the  submitted specimen  and confirmed on repeat testing.  While 2019 novel coronavirus  (SARS-CoV-2) nucleic acids may be present in the submitted sample  additional confirmatory testing may be necessary for epidemiological  and / or clinical management purposes  to differentiate between  SARS-CoV-2 and other Sarbecovirus currently known to infect humans.  If clinically indicated additional testing with an alternate test  methodology [phone]) is advised. The SARS-CoV-2 RNA is generally  detectable in upper and lower respiratory sp ecimens during the acute  phase of infection. The expected result is Negative. Fact Sheet for Patients:  StrictlyIdeas.no Fact Sheet for Healthcare Providers: BankingDealers.co.za This test is not yet approved or cleared by the [place] FDA and has been authorized for detection and/or diagnosis of SARS-CoV-2 by FDA under an Emergency Use Authorization (EUA).  This EUA will remain in effect (meaning this test can be used) for the duration of the COVID-19 declaration under Section 564(b)(1) of the Act, 21 U.S.C. section 360bbb-3(b)(1), unless the authorization is terminated or revoked sooner. Performed at [facility], [address], [place], [place] [address]   Blood Culture (routine x 2)     Status: None (Preliminary result)   Collection Time: 12/26/18 12:12 PM   Specimen: BLOOD LEFT HAND  Result Value Ref Range Status   Specimen Description BLOOD LEFT HAND  Final   Special Requests   Final    BOTTLES DRAWN AEROBIC AND ANAEROBIC Blood Culture results may not be optimal due to an inadequate volume of blood received in culture bottles   Culture   Final    NO GROWTH 2 DAYS Performed at [facility], [address], [place], [place] [address]    Report Status PENDING  Incomplete  Blood Culture (routine x 2)     Status: None (Preliminary result)   Collection Time: 12/26/18 12:12 PM   Specimen: BLOOD LEFT HAND   Result Value  Ref Range Status   Specimen Description BLOOD LEFT HAND  Final   Special Requests   Final    BOTTLES DRAWN AEROBIC AND ANAEROBIC Blood Culture results may not be optimal due to an inadequate volume of blood received in culture bottles   Culture   Final    NO GROWTH 2 DAYS Performed at [facility], [address] [place], [place] [address]    Report Status PENDING  Incomplete  MRSA PCR Screening     Status: None   Collection Time: 12/26/18  8:18 PM   Specimen: Nasal Mucosa; Nasopharyngeal  Result Value Ref Range Status   MRSA by PCR NEGATIVE NEGATIVE Final    Comment:        The GeneXpert MRSA Assay (FDA approved for NASAL specimens only), is one component of a comprehensive MRSA colonization surveillance program. It is not intended to diagnose MRSA infection nor to guide or monitor treatment for MRSA infections. Performed at [facility], [address] [place], [place] [address]    Studies/Results: Dg Ankle 2 Views Right  Result Date: 12/26/2018 CLINICAL DATA:  Right ankle pain. EXAM: RIGHT ANKLE - 2 VIEW COMPARISON:  No recent. FINDINGS: Diffuse soft tissue swelling. No acute bony abnormality identified. No evidence of fracture. Corticated bony densities noted adjacent to the mediolateral malleoli. These consistent old fracture fragments. IMPRESSION: 1.  Diffuse soft tissue swelling.  No acute bony abnormality. 2. Corticated bony densities noted adjacent to the medial and lateral malleoli consistent with old fracture fragments. Electronically Signed   By: Marcello Moores  Register   On: 12/26/2018 14:11   Dg Chest Port 1 View  Result Date: 12/27/2018 CLINICAL DATA:  Pulmonary edema EXAM: PORTABLE CHEST 1 VIEW COMPARISON:  Radiograph 12/26/2018 FINDINGS: Cardiomegaly with bilateral interstitial opacities asymmetrically increased in the right lung with bilateral pleural effusions. More confluent opacity is present in the right lung base. Overall appearance is not  significantly changed from 1 day prior. Stable cardiomegaly. No pneumothorax. IMPRESSION: Appearance not significantly changed from 1 day prior with features of pulmonary edema and bilateral effusions. More confluent opacity in the right base possibly reflecting atelectasis or pneumonia. Stable cardiomegaly. Electronically Signed   By: Lovena Le M.D.   On: 12/27/2018 04:21   Dg Chest Port 1 View  Result Date: 12/26/2018 CLINICAL DATA:  Generalized weakness and worsening shortness of breath with exertion. Status post fall yesterday. EXAM: PORTABLE CHEST 1 VIEW COMPARISON:  Single-view of the chest 12/08/2018 and 11/19/2018. FINDINGS: There is cardiomegaly and pulmonary edema with small bilateral pleural effusions. Right basilar airspace disease is seen. No pneumothorax. No acute bony abnormality. IMPRESSION: Pulmonary edema with associated small effusions, larger on the right. Right basilar airspace disease could be atelectasis or pneumonia. Electronically Signed   By: Inge Rise M.D.   On: 12/26/2018 11:32    Medications:  Prior to Admission:  Medications Prior to Admission  Medication Sig Dispense Refill Last Dose  . albuterol (PROVENTIL) (2.5 MG/3ML) 0.083% nebulizer solution Take 2.5 mg by nebulization every 6 (six) hours as needed for wheezing or shortness of breath.   unknown  . amiodarone (PACERONE) 200 MG tablet Take 200 mg by mouth daily.   unknown  . diltiazem (CARDIZEM CD) 180 MG 24 hr capsule Take 1 capsule (180 mg total) by mouth daily. 30 capsule 1 unknown  . furosemide (LASIX) 40 MG tablet Take 1 tablet (40 mg total) by mouth daily as needed for fluid. (Patient taking differently: Take 60 mg by mouth daily. )  unknown  . metFORMIN (GLUCOPHAGE-XR) 500 MG 24 hr tablet Take 2 tablets (1,000 mg total) by mouth 2 (two) times daily with a meal. 120 tablet 1 unknown  . metoprolol tartrate (LOPRESSOR) 25 MG tablet Take 1 tablet (25 mg total) by mouth 2 (two) times daily. 60 tablet 0  unknonw  . nitrofurantoin (MACRODANTIN) 100 MG capsule Take 100 mg by mouth 2 (two) times daily.   unknown  . potassium chloride SA (KLOR-CON) 20 MEQ tablet Take 1 tablet (20 mEq total) by mouth daily as needed (take only when taking lasix). (Patient taking differently: Take 20 mEq by mouth 2 (two) times daily. )   unknown  . predniSONE (DELTASONE) 20 MG tablet Take 2 PO QAM x5 days (Patient taking differently: Take 40 mg by mouth daily with breakfast. Take 2 PO QAM x5 days) 10 tablet 0 unknown  . QUEtiapine (SEROQUEL) 25 MG tablet Take 1 tablet (25 mg total) by mouth daily at 6 PM. 30 tablet 3 unknown  . traMADol (ULTRAM) 50 MG tablet Take 50 mg by mouth 3 (three) times daily.   unknown  . acetaminophen (TYLENOL) 500 MG tablet Take 500 mg by mouth every 8 (eight) hours as needed for mild pain or headache.    unknown  . rivaroxaban (XARELTO) 20 MG TABS tablet Take 1 tablet (20 mg total) by mouth daily with supper. Stop Xarelto until Monday (08/26/18) in anticipation for kyphoplasty (Patient not taking: Reported on 12/26/2018) 21 tablet 0 Not Taking at Unknown time   Scheduled: . budesonide (PULMICORT) nebulizer solution  0.25 mg Nebulization BID  . Chlorhexidine Gluconate Cloth  6 each Topical Daily  . diltiazem  180 mg Oral Daily  . furosemide  40 mg Intravenous Q12H  . insulin aspart  0-20 Units Subcutaneous Q4H  . insulin aspart  3 Units Subcutaneous TID WC  . insulin glargine  10 Units Subcutaneous Q24H  . ipratropium-albuterol  3 mL Nebulization Q4H  . methylPREDNISolone (SOLU-MEDROL) injection  60 mg Intravenous Q6H  . metoprolol tartrate  2.5 mg Intravenous Q6H  . pantoprazole (PROTONIX) IV  40 mg Intravenous Q24H  . QUEtiapine  25 mg Oral q1800  . rivaroxaban  20 mg Oral Q supper   Continuous: . sodium chloride Stopped (12/27/18 2243)  . meropenem (MERREM) IV 1 g (12/28/18 0631)   [id number] **OR** acetaminophen, haloperidol lactate, ondansetron **OR** ondansetron (ZOFRAN)  IV, senna-docusate  Assesment: She was admitted with COPD exacerbation and acute on chronic hypoxic and hypercapnic respiratory failure requiring BiPAP.  She has what appears to be a pneumonia in the right lower lobe probably from aspiration but it is not totally clear.  She was confused and lethargic at home.  She has history of ESBL urinary tract infection so she is on Merrem.  She has substantially improved.  She has dementia which complicates her situation.  She has some element of heart failure and had elevated BNP on admission Active Problems:   COPD (chronic obstructive pulmonary disease) (HCC)   COPD with acute exacerbation (HCC)   Acute on chronic respiratory failure (HCC)   Diabetes mellitus ([place])   Chronic respiratory failure with hypoxia (HCC)   Leukocytosis   Breast cancer (HCC)   Osteoporosis   Vitamin D deficiency   Anemia   Bronchiectasis with acute exacerbation (HCC)   Dementia with behavioral disturbance (HCC)   Acute respiratory failure with hypoxia and hypercapnia (HCC)   On home O2   CO2 narcosis   Acute and chronic respiratory failure  with hypercapnia ([place])   Pulmonary edema   Elevated brain natriuretic peptide (BNP) level    Plan: Continue current treatments    LOS: 2 days   Alonza Bogus 12/28/2018, 9:29 AM

## 2018-12-28 NOTE — Progress Notes (Signed)
PROGRESS NOTE  Eileen [name]  [id number]  DOB: 1945/12/27  DOA: 12/26/2018 PCP: Lavella Lemons, PA  Brief Admission Hx: 73 y.o. female with advanced end stage COPD, dementia, atrial flutter, hypertension, type 2 diabetes mellitus, right breast cancer, multiple recent hospitalizations for frailty, progressive lung disease and GI bleeding presented by EMS with weakness and encephalopathy.   MDM/Assessment & Plan:   1. Acute on chronic respiratory failure with hypercapnia-patient required BiPAP therapy overnight but now is on nasal cannula.  Appreciate assistance of pulmonologist Dr. Luan Pulling with management.  The patient is slowly improving but remains high risk for decompensation. 2. Right basilar pneumonia- patient likely aspirated and antibiotics have been changed to broaden coverage.  Continue. 3. Pulmonary edema- continue IV Lasix for diuresis, monitor weights intake and output and electrolytes. 4. CO2 narcosis-this was treated with BiPAP therapy.  Her confusion is improving.   5. Dementia with behavioral disturbances- Haldol ordered as needed for symptoms. 6. Type 2 diabetes mellitus-she has hyperglycemia and we have increased her basal coverage and will continue SSI and prandial coverage. 7. Atrial flutter- patient is on IV metoprolol and Xarelto.  Now that she is taking p.o. we will resume her oral metoprolol. 8. Goals of care-anticipate palliative medicine consult on Monday if still in hospital.  DVT prophylaxis: Xarelto Code Status: Full Family Communication: Attempt telephone phone call Disposition Plan: Continue stepdown ICU   Consultants:  Pulmonology  Procedures:  BiPAP  Antimicrobials:  Meropenem  Subjective: Patient was on BiPAP overnight but now is on nasal cannula.  She reports that she is feeling a little better today.  She remains short of breath.  Objective: Vitals:   12/28/18 0700 12/28/18 0723 12/28/18 0800 12/28/18 0900  BP: 111/63  (!)  107/58 113/64  Pulse: 84  77 94  Resp: (!) 24  18 17   Temp:   97.7 F (36.5 C)   TempSrc:   Oral   SpO2: (!) 82% 95% 92% (!) 87%  Weight:      Height:        Intake/Output Summary (Last 24 hours) at 12/28/2018 1004 Last data filed at 12/28/2018 0800 Gross per 24 hour  Intake 1276.59 ml  Output 1800 ml  Net -523.41 ml   Filed Weights   12/26/18 1052 12/27/18 0500 12/28/18 0500  Weight: 67 kg 67.5 kg 66.6 kg   REVIEW OF SYSTEMS  As in HPI, otherwise negative  Exam:   General exam: Awake, arousable, speaking in full sentences. Respiratory system: Diffuse poor air movement with Rales in the right lower lobe.  Mild increased work of breathing. Cardiovascular system: Irregularly irregular S1 & S2 heard. No JVD, murmurs, gallops, clicks or pedal edema. Gastrointestinal system: Abdomen is nondistended, soft and nontender. Normal bowel sounds heard. Central nervous system: Alert and confused. No focal neurological deficits. Extremities: 1+ edema bilateral lower extremities.  Data Reviewed: Basic Metabolic Panel: Recent Labs  Lab 12/26/18 1041 12/27/18 0438 12/28/18 0454  NA 141 143 142  K 4.3 4.5 4.0  CL 87* 93* 89*  CO2 45* 39* 43*  GLUCOSE 225* 283* 115*  BUN 8 10 17   CREATININE 0.52 0.59 0.55  CALCIUM 9.8 8.8* 8.6*  MG  --  2.0 1.9   Liver Function Tests: Recent Labs  Lab 12/26/18 1041 12/27/18 0438 12/28/18 0454  AST 17 12* 13*  ALT 15 15 14   ALKPHOS 80 66 58  BILITOT 0.6 0.6 0.5  PROT 6.5 5.7* 5.5*  ALBUMIN 3.3* 2.9* 2.9*  No results for input(s): LIPASE, AMYLASE in the last 168 hours. Recent Labs  Lab 12/26/18 1043  AMMONIA 24   CBC: Recent Labs  Lab 12/26/18 1041 12/27/18 0438 12/28/18 0454  WBC 12.6* 10.4 15.6*  NEUTROABS 10.4* 9.3* 14.3*  HGB 9.0* 7.7* 7.5*  HCT 33.4* 28.8* 27.1*  MCV 99.7 99.0 97.5  PLT 318 277 292   Cardiac Enzymes: No results for input(s): CKTOTAL, CKMB, CKMBINDEX, TROPONINI in the last 168 hours. CBG (last 3)    Recent Labs    12/28/18 0029 12/28/18 0444 12/28/18 0741  GLUCAP 82 117* 117*   Recent Results (from the past 240 hour(s))  Urine Culture     Status: Abnormal (Preliminary result)   Collection Time: 12/26/18 10:43 AM   Specimen: Urine, Catheterized  Result Value Ref Range Status   Specimen Description   Final    URINE, CATHETERIZED Performed at [facility], [address], [place], [place] [address]    Special Requests   Final    NONE Performed at [facility], [address], [place], [place] [address]    Culture (A)  Final    >=100,000 COLONIES/mL GRAM NEGATIVE RODS IDENTIFICATION AND SUSCEPTIBILITIES TO FOLLOW Performed at [facility], [place] [address], [place], [place] [address]    Report Status PENDING  Incomplete  SARS Coronavirus 2 by RT PCR (hospital order, performed in [facility] hospital lab) Nasopharyngeal Nasopharyngeal Swab     Status: None   Collection Time: 12/26/18 10:46 AM   Specimen: Nasopharyngeal Swab  Result Value Ref Range Status   SARS Coronavirus 2 NEGATIVE NEGATIVE Final    Comment: (NOTE) If result is NEGATIVE SARS-CoV-2 target nucleic acids are NOT DETECTED. The SARS-CoV-2 RNA is generally detectable in upper and lower  respiratory specimens during the acute phase of infection. The lowest  concentration of SARS-CoV-2 viral copies this assay can detect is 250  copies / mL. A negative result does not preclude SARS-CoV-2 infection  and should not be used as the sole basis for treatment or other  patient management decisions.  A negative result may occur with  improper specimen collection / handling, submission of specimen other  than nasopharyngeal swab, presence of viral mutation(s) within the  areas targeted by this assay, and inadequate number of viral copies  (<250 copies / mL). A negative result must be combined with clinical  observations, patient history, and epidemiological information. If result is POSITIVE SARS-CoV-2 target  nucleic acids are DETECTED. The SARS-CoV-2 RNA is generally detectable in upper and lower  respiratory specimens dur ing the acute phase of infection.  Positive  results are indicative of active infection with SARS-CoV-2.  Clinical  correlation with patient history and other diagnostic information is  necessary to determine patient infection status.  Positive results do  not rule out bacterial infection or co-infection with other viruses. If result is PRESUMPTIVE POSTIVE SARS-CoV-2 nucleic acids MAY BE PRESENT.   A presumptive positive result was obtained on the submitted specimen  and confirmed on repeat testing.  While 2019 novel coronavirus  (SARS-CoV-2) nucleic acids may be present in the submitted sample  additional confirmatory testing may be necessary for epidemiological  and / or clinical management purposes  to differentiate between  SARS-CoV-2 and other Sarbecovirus currently known to infect humans.  If clinically indicated additional testing with an alternate test  methodology [phone]) is advised. The SARS-CoV-2 RNA is generally  detectable in upper and lower respiratory sp ecimens during the acute  phase  of infection. The expected result is Negative. Fact Sheet for Patients:  StrictlyIdeas.no Fact Sheet for Healthcare Providers: BankingDealers.co.za This test is not yet approved or cleared by the [place] FDA and has been authorized for detection and/or diagnosis of SARS-CoV-2 by FDA under an Emergency Use Authorization (EUA).  This EUA will remain in effect (meaning this test can be used) for the duration of the COVID-19 declaration under Section 564(b)(1) of the Act, 21 U.S.C. section 360bbb-3(b)(1), unless the authorization is terminated or revoked sooner. Performed at [facility], [address] [place], [place] [address]   Blood Culture (routine x 2)     Status: None (Preliminary result)   Collection Time:  12/26/18 12:12 PM   Specimen: BLOOD LEFT HAND  Result Value Ref Range Status   Specimen Description BLOOD LEFT HAND  Final   Special Requests   Final    BOTTLES DRAWN AEROBIC AND ANAEROBIC Blood Culture results may not be optimal due to an inadequate volume of blood received in culture bottles   Culture   Final    NO GROWTH 2 DAYS Performed at [facility], [address], [place], [place] [address]    Report Status PENDING  Incomplete  Blood Culture (routine x 2)     Status: None (Preliminary result)   Collection Time: 12/26/18 12:12 PM   Specimen: BLOOD LEFT HAND  Result Value Ref Range Status   Specimen Description BLOOD LEFT HAND  Final   Special Requests   Final    BOTTLES DRAWN AEROBIC AND ANAEROBIC Blood Culture results may not be optimal due to an inadequate volume of blood received in culture bottles   Culture   Final    NO GROWTH 2 DAYS Performed at [facility], [address] [place], [place] [address]    Report Status PENDING  Incomplete  MRSA PCR Screening     Status: None   Collection Time: 12/26/18  8:18 PM   Specimen: Nasal Mucosa; Nasopharyngeal  Result Value Ref Range Status   MRSA by PCR NEGATIVE NEGATIVE Final    Comment:        The GeneXpert MRSA Assay (FDA approved for NASAL specimens only), is one component of a comprehensive MRSA colonization surveillance program. It is not intended to diagnose MRSA infection nor to guide or monitor treatment for MRSA infections. Performed at [facility], [address], [place], [place] [address]      Studies: Dg Ankle 2 Views Right  Result Date: 12/26/2018 CLINICAL DATA:  Right ankle pain. EXAM: RIGHT ANKLE - 2 VIEW COMPARISON:  No recent. FINDINGS: Diffuse soft tissue swelling. No acute bony abnormality identified. No evidence of fracture. Corticated bony densities noted adjacent to the mediolateral malleoli. These consistent old fracture fragments. IMPRESSION: 1.  Diffuse soft tissue swelling.  No acute bony  abnormality. 2. Corticated bony densities noted adjacent to the medial and lateral malleoli consistent with old fracture fragments. Electronically Signed   By: Marcello Moores  Register   On: 12/26/2018 14:11   Dg Chest Port 1 View  Result Date: 12/27/2018 CLINICAL DATA:  Pulmonary edema EXAM: PORTABLE CHEST 1 VIEW COMPARISON:  Radiograph 12/26/2018 FINDINGS: Cardiomegaly with bilateral interstitial opacities asymmetrically increased in the right lung with bilateral pleural effusions. More confluent opacity is present in the right lung base. Overall appearance is not significantly changed from 1 day prior. Stable cardiomegaly. No pneumothorax. IMPRESSION: Appearance not significantly changed from 1 day prior with features of pulmonary edema and bilateral effusions. More confluent opacity in  the right base possibly reflecting atelectasis or pneumonia. Stable cardiomegaly. Electronically Signed   By: Lovena Le M.D.   On: 12/27/2018 04:21   Dg Chest Port 1 View  Result Date: 12/26/2018 CLINICAL DATA:  Generalized weakness and worsening shortness of breath with exertion. Status post fall yesterday. EXAM: PORTABLE CHEST 1 VIEW COMPARISON:  Single-view of the chest 12/08/2018 and 11/19/2018. FINDINGS: There is cardiomegaly and pulmonary edema with small bilateral pleural effusions. Right basilar airspace disease is seen. No pneumothorax. No acute bony abnormality. IMPRESSION: Pulmonary edema with associated small effusions, larger on the right. Right basilar airspace disease could be atelectasis or pneumonia. Electronically Signed   By: Inge Rise M.D.   On: 12/26/2018 11:32   Scheduled Meds:  budesonide (PULMICORT) nebulizer solution  0.25 mg Nebulization BID   Chlorhexidine Gluconate Cloth  6 each Topical Daily   diltiazem  180 mg Oral Daily   furosemide  40 mg Intravenous Q12H   insulin aspart  0-20 Units Subcutaneous Q4H   insulin aspart  3 Units Subcutaneous TID WC   insulin glargine  10  Units Subcutaneous Q24H   ipratropium-albuterol  3 mL Nebulization Q4H   methylPREDNISolone (SOLU-MEDROL) injection  60 mg Intravenous Q6H   metoprolol tartrate  2.5 mg Intravenous Q6H   pantoprazole (PROTONIX) IV  40 mg Intravenous Q24H   QUEtiapine  25 mg Oral q1800   rivaroxaban  20 mg Oral Q supper   Continuous Infusions:  sodium chloride Stopped (12/27/18 2243)   meropenem (MERREM) IV 1 g (12/28/18 0631)    Active Problems:   COPD (chronic obstructive pulmonary disease) ([place])   COPD with acute exacerbation (HCC)   Acute on chronic respiratory failure (HCC)   Diabetes mellitus ([place])   Chronic respiratory failure with hypoxia (HCC)   Leukocytosis   Breast cancer (HCC)   Osteoporosis   Vitamin D deficiency   Anemia   Bronchiectasis with acute exacerbation (HCC)   Dementia with behavioral disturbance (HCC)   Acute respiratory failure with hypoxia and hypercapnia (HCC)   On home O2   CO2 narcosis   Acute and chronic respiratory failure with hypercapnia (HCC)   Pulmonary edema   Elevated brain natriuretic peptide (BNP) level  Critical care time spent: 30 minutes  Irwin Brakeman, MD [facility] 12/28/2018, 10:04 AM    LOS: 2 days  How to contact the [facility] Attending or Consulting provider Echo or covering provider during after hours [place], for this patient?  1. Check the care team in [facility] and look for a) attending/consulting TR[facility] provider listed and b) the [facility] team listed 2. Log into [email or web address] and use [facility] universal password to access. If you do not have the password, please contact the hospital operator. 3. Locate the [facility] provider you are looking for under [facility] and page to a number that you can be directly reached. 4. If you still have difficulty reaching the provider, please page the [facility] (Director on Call) for the Hospitalists listed on amion for assistance.

## 2018-12-29 ENCOUNTER — Inpatient Hospital Stay (HOSPITAL_COMMUNITY): Payer: Medicare Other

## 2018-12-29 DIAGNOSIS — J449 Chronic obstructive pulmonary disease, unspecified: Secondary | ICD-10-CM

## 2018-12-29 LAB — CBC WITH DIFFERENTIAL/PLATELET
Abs Immature Granulocytes: 0.08 10*3/uL — ABNORMAL HIGH (ref 0.00–0.07)
Basophils Absolute: 0 10*3/uL (ref 0.0–0.1)
Basophils Relative: 0 %
Eosinophils Absolute: 0 10*3/uL (ref 0.0–0.5)
Eosinophils Relative: 0 %
HCT: 28.3 % — ABNORMAL LOW (ref 36.0–46.0)
Hemoglobin: 7.7 g/dL — ABNORMAL LOW (ref 12.0–15.0)
Immature Granulocytes: 1 %
Lymphocytes Relative: 4 %
Lymphs Abs: 0.5 10*3/uL — ABNORMAL LOW (ref 0.7–4.0)
MCH: 26.4 pg (ref 26.0–34.0)
MCHC: 27.2 g/dL — ABNORMAL LOW (ref 30.0–36.0)
MCV: 96.9 fL (ref 80.0–100.0)
Monocytes Absolute: 0.6 10*3/uL (ref 0.1–1.0)
Monocytes Relative: 5 %
Neutro Abs: 11.7 10*3/uL — ABNORMAL HIGH (ref 1.7–7.7)
Neutrophils Relative %: 90 %
Platelets: 286 10*3/uL (ref 150–400)
RBC: 2.92 MIL/uL — ABNORMAL LOW (ref 3.87–5.11)
RDW: 15.2 % (ref 11.5–15.5)
WBC: 12.8 10*3/uL — ABNORMAL HIGH (ref 4.0–10.5)
nRBC: 0 % (ref 0.0–0.2)

## 2018-12-29 LAB — GLUCOSE, CAPILLARY
Glucose-Capillary: 176 mg/dL — ABNORMAL HIGH (ref 70–99)
Glucose-Capillary: 334 mg/dL — ABNORMAL HIGH (ref 70–99)
Glucose-Capillary: 111 mg/dL — ABNORMAL HIGH (ref 70–99)
Glucose-Capillary: 220 mg/dL — ABNORMAL HIGH (ref 70–99)

## 2018-12-29 LAB — COMPREHENSIVE METABOLIC PANEL
ALT: 15 U/L (ref 0–44)
AST: 13 U/L — ABNORMAL LOW (ref 15–41)
Albumin: 3 g/dL — ABNORMAL LOW (ref 3.5–5.0)
Alkaline Phosphatase: 60 U/L (ref 38–126)
Anion gap: 10 (ref 5–15)
BUN: 22 mg/dL (ref 8–23)
CO2: 42 mmol/L — ABNORMAL HIGH (ref 22–32)
Calcium: 8.6 mg/dL — ABNORMAL LOW (ref 8.9–10.3)
Chloride: 88 mmol/L — ABNORMAL LOW (ref 98–111)
Creatinine, Ser: 0.55 mg/dL (ref 0.44–1.00)
GFR calc Af Amer: >60 mL/min (ref >60)
GFR calc non Af Amer: >60 mL/min (ref >60)
Glucose, Bld: 184 mg/dL — ABNORMAL HIGH (ref 70–99)
Potassium: 3.9 mmol/L (ref 3.5–5.1)
Sodium: 140 mmol/L (ref 135–145)
Total Bilirubin: 0.7 mg/dL (ref 0.3–1.2)
Total Protein: 5.7 g/dL — ABNORMAL LOW (ref 6.5–8.1)

## 2018-12-29 LAB — URINE CULTURE: Culture: >=100000 — AB

## 2018-12-29 LAB — MAGNESIUM: Magnesium: 2 mg/dL (ref 1.7–2.4)

## 2018-12-29 LAB — BRAIN NATRIURETIC PEPTIDE: B Natriuretic Peptide: 582 pg/mL — ABNORMAL HIGH (ref 0.0–100.0)

## 2018-12-29 NOTE — Progress Notes (Signed)
Subjective: She did well with BiPAP last night but she does get agitated and tries to take everything off.  This morning she says she feels okay.  Objective: Vital signs in last 24 hours: Temp:  [97.9 F (36.6 C)-98.1 F (36.7 C)] 98 F (36.7 C) (10/18 1156) Pulse Rate:  [68-117] 100 (10/18 1100) Resp:  [13-27] 16 (10/18 0800) BP: (93-136)/(54-83) 109/59 (10/18 1100) SpO2:  [80 %-100 %] 91 % (10/18 1100) FiO2 (%):  [50 %] 50 % (10/18 0819) Weight change:     Intake/Output from previous day: 10/17 0701 - 10/18 0700 In: 1140 [P.O.:840; IV Piggyback:300] Out: 800 [Urine:800]  PHYSICAL EXAM General appearance: alert, cooperative and no distress Resp: rhonchi bilaterally Cardio: regular rate and rhythm, S1, S2 normal, no murmur, click, rub or gallop GI: soft, non-tender; bowel sounds normal; no masses,  no organomegaly Extremities: extremities normal, atraumatic, no cyanosis or edema  Lab Results:  Results for orders placed or performed during the hospital encounter of 12/26/18 (from the past 48 hour(s))  Glucose, capillary     Status: Abnormal   Collection Time: 12/27/18  4:14 PM  Result Value Ref Range   Glucose-Capillary 280 (H) 70 - 99 mg/dL  Glucose, capillary     Status: None   Collection Time: 12/27/18  7:56 PM  Result Value Ref Range   Glucose-Capillary 99 70 - 99 mg/dL   Comment 1 Notify RN    Comment 2 Document in Chart   Glucose, capillary     Status: None   Collection Time: 12/28/18 12:29 AM  Result Value Ref Range   Glucose-Capillary 82 70 - 99 mg/dL  Glucose, capillary     Status: Abnormal   Collection Time: 12/28/18  4:44 AM  Result Value Ref Range   Glucose-Capillary 117 (H) 70 - 99 mg/dL  Comprehensive metabolic panel     Status: Abnormal   Collection Time: 12/28/18  4:54 AM  Result Value Ref Range   Sodium 142 135 - 145 mmol/L   Potassium 4.0 3.5 - 5.1 mmol/L   Chloride 89 (L) 98 - 111 mmol/L   CO2 43 (H) 22 - 32 mmol/L   Glucose, Bld 115 (H) 70 -  99 mg/dL   BUN 17 8 - 23 mg/dL   Creatinine, Ser 0.55 0.44 - 1.00 mg/dL   Calcium 8.6 (L) 8.9 - 10.3 mg/dL   Total Protein 5.5 (L) 6.5 - 8.1 g/dL   Albumin 2.9 (L) 3.5 - 5.0 g/dL   AST 13 (L) 15 - 41 U/L   ALT 14 0 - 44 U/L   Alkaline Phosphatase 58 38 - 126 U/L   Total Bilirubin 0.5 0.3 - 1.2 mg/dL   GFR calc non Af Amer >60 >60 mL/min   GFR calc Af Amer >60 >60 mL/min   Anion gap 10 5 - 15    Comment: Performed at [facility], [address] [place], [place] [address]  Magnesium     Status: None   Collection Time: 12/28/18  4:54 AM  Result Value Ref Range   Magnesium 1.9 1.7 - 2.4 mg/dL    Comment: Performed at [facility], [address] [place], [place] [address]  CBC WITH DIFFERENTIAL     Status: Abnormal   Collection Time: 12/28/18  4:54 AM  Result Value Ref Range   WBC 15.6 (H) 4.0 - 10.5 K/uL   RBC 2.78 (L) 3.87 - 5.11 MIL/uL   Hemoglobin 7.5 (L) 12.0 - 15.0 g/dL   HCT 27.1 (  L) 36.0 - 46.0 %   MCV 97.5 80.0 - 100.0 fL   MCH 27.0 26.0 - 34.0 pg   MCHC 27.7 (L) 30.0 - 36.0 g/dL   RDW 15.0 11.5 - 15.5 %   Platelets 292 150 - 400 K/uL   nRBC 0.0 0.0 - 0.2 %   Neutrophils Relative % 91 %   Neutro Abs 14.3 (H) 1.7 - 7.7 K/uL   Lymphocytes Relative 5 %   Lymphs Abs 0.7 0.7 - 4.0 K/uL   Monocytes Relative 3 %   Monocytes Absolute 0.5 0.1 - 1.0 K/uL   Eosinophils Relative 0 %   Eosinophils Absolute 0.0 0.0 - 0.5 K/uL   Basophils Relative 0 %   Basophils Absolute 0.0 0.0 - 0.1 K/uL   Immature Granulocytes 1 %   Abs Immature Granulocytes 0.08 (H) 0.00 - 0.07 K/uL    Comment: Performed at [facility], [address], [place], [place] [address]  Brain natriuretic peptide     Status: Abnormal   Collection Time: 12/28/18  4:54 AM  Result Value Ref Range   B Natriuretic Peptide 347.0 (H) 0.0 - 100.0 pg/mL    Comment: Performed at [facility], [address], [place], [place] [address]  Glucose, capillary     Status: Abnormal   Collection Time: 12/28/18  7:41 AM  Result  Value Ref Range   Glucose-Capillary 117 (H) 70 - 99 mg/dL  Glucose, capillary     Status: Abnormal   Collection Time: 12/28/18 11:21 AM  Result Value Ref Range   Glucose-Capillary 282 (H) 70 - 99 mg/dL  Glucose, capillary     Status: Abnormal   Collection Time: 12/28/18  4:57 PM  Result Value Ref Range   Glucose-Capillary 118 (H) 70 - 99 mg/dL  Glucose, capillary     Status: Abnormal   Collection Time: 12/28/18  9:05 PM  Result Value Ref Range   Glucose-Capillary 248 (H) 70 - 99 mg/dL  Comprehensive metabolic panel     Status: Abnormal   Collection Time: 12/29/18  4:52 AM  Result Value Ref Range   Sodium 140 135 - 145 mmol/L   Potassium 3.9 3.5 - 5.1 mmol/L   Chloride 88 (L) 98 - 111 mmol/L   CO2 42 (H) 22 - 32 mmol/L   Glucose, Bld 184 (H) 70 - 99 mg/dL   BUN 22 8 - 23 mg/dL   Creatinine, Ser 0.55 0.44 - 1.00 mg/dL   Calcium 8.6 (L) 8.9 - 10.3 mg/dL   Total Protein 5.7 (L) 6.5 - 8.1 g/dL   Albumin 3.0 (L) 3.5 - 5.0 g/dL   AST 13 (L) 15 - 41 U/L   ALT 15 0 - 44 U/L   Alkaline Phosphatase 60 38 - 126 U/L   Total Bilirubin 0.7 0.3 - 1.2 mg/dL   GFR calc non Af Amer >60 >60 mL/min   GFR calc Af Amer >60 >60 mL/min   Anion gap 10 5 - 15    Comment: Performed at [facility], [address] [place], [place] [address]  Magnesium     Status: None   Collection Time: 12/29/18  4:52 AM  Result Value Ref Range   Magnesium 2.0 1.7 - 2.4 mg/dL    Comment: Performed at [facility], [address], [place], [place] [address]  CBC WITH DIFFERENTIAL     Status: Abnormal   Collection Time: 12/29/18  4:52 AM  Result Value Ref Range   WBC 12.8 (H) 4.0 - 10.5  K/uL   RBC 2.92 (L) 3.87 - 5.11 MIL/uL   Hemoglobin 7.7 (L) 12.0 - 15.0 g/dL   HCT 28.3 (L) 36.0 - 46.0 %   MCV 96.9 80.0 - 100.0 fL   MCH 26.4 26.0 - 34.0 pg   MCHC 27.2 (L) 30.0 - 36.0 g/dL   RDW 15.2 11.5 - 15.5 %   Platelets 286 150 - 400 K/uL   nRBC 0.0 0.0 - 0.2 %   Neutrophils Relative % 90 %   Neutro Abs 11.7 (H) 1.7 -  7.7 K/uL   Lymphocytes Relative 4 %   Lymphs Abs 0.5 (L) 0.7 - 4.0 K/uL   Monocytes Relative 5 %   Monocytes Absolute 0.6 0.1 - 1.0 K/uL   Eosinophils Relative 0 %   Eosinophils Absolute 0.0 0.0 - 0.5 K/uL   Basophils Relative 0 %   Basophils Absolute 0.0 0.0 - 0.1 K/uL   Immature Granulocytes 1 %   Abs Immature Granulocytes 0.08 (H) 0.00 - 0.07 K/uL    Comment: Performed at [facility], [address], [place], [place] [address]  Brain natriuretic peptide     Status: Abnormal   Collection Time: 12/29/18  4:52 AM  Result Value Ref Range   B Natriuretic Peptide 582.0 (H) 0.0 - 100.0 pg/mL    Comment: Performed at [facility], [address], [place], [place] [address]  Glucose, capillary     Status: Abnormal   Collection Time: 12/29/18  8:17 AM  Result Value Ref Range   Glucose-Capillary 176 (H) 70 - 99 mg/dL  Glucose, capillary     Status: Abnormal   Collection Time: 12/29/18 11:57 AM  Result Value Ref Range   Glucose-Capillary 334 (H) 70 - 99 mg/dL    ABGS No results for input(s): PHART, PO2ART, TCO2, HCO3 in the last 72 hours.  Invalid input(s): PCO2 CULTURES Recent Results (from the past 240 hour(s))  Urine Culture     Status: Abnormal   Collection Time: 12/26/18 10:43 AM   Specimen: Urine, Catheterized  Result Value Ref Range Status   Specimen Description   Final    URINE, CATHETERIZED Performed at [facility], [address] [place],  [address]    Special Requests   Final    NONE Performed at [facility], [address] [place],  [address]    Culture (A)  Final    >=100,000 COLONIES/mL ESCHERICHIA COLI Confirmed Extended Spectrum Beta-Lactamase Producer (ESBL).  In bloodstream infections from ESBL organisms, carbapenems are preferred over piperacillin/tazobactam. They are shown to have a lower risk of mortality.    Report Status 12/29/2018 FINAL  Final   Organism ID, Bacteria ESCHERICHIA COLI (A)  Final      Susceptibility   Escherichia coli -  MIC*    AMPICILLIN >=32 RESISTANT Resistant     CEFAZOLIN >=64 RESISTANT Resistant     CEFTRIAXONE >=64 RESISTANT Resistant     CIPROFLOXACIN >=4 RESISTANT Resistant     GENTAMICIN <=1 SENSITIVE Sensitive     IMIPENEM <=0.25 SENSITIVE Sensitive     NITROFURANTOIN 32 SENSITIVE Sensitive     TRIMETH/SULFA <=20 SENSITIVE Sensitive     AMPICILLIN/SULBACTAM >=32 RESISTANT Resistant     PIP/TAZO <=4 SENSITIVE Sensitive     Extended ESBL POSITIVE Resistant     * >=100,000 COLONIES/mL ESCHERICHIA COLI  SARS Coronavirus 2 by RT PCR (hospital order, performed in [place] hospital lab) Nasopharyngeal Nasopharyngeal Swab     Status: None   Collection Time: 12/26/18 10:46  AM   Specimen: Nasopharyngeal Swab  Result Value Ref Range Status   SARS Coronavirus 2 NEGATIVE NEGATIVE Final    Comment: (NOTE) If result is NEGATIVE SARS-CoV-2 target nucleic acids are NOT DETECTED. The SARS-CoV-2 RNA is generally detectable in upper and lower  respiratory specimens during the acute phase of infection. The lowest  concentration of SARS-CoV-2 viral copies this assay can detect is 250  copies / mL. A negative result does not preclude SARS-CoV-2 infection  and should not be used as the sole basis for treatment or other  patient management decisions.  A negative result may occur with  improper specimen collection / handling, submission of specimen other  than nasopharyngeal swab, presence of viral mutation(s) within the  areas targeted by this assay, and inadequate number of viral copies  (<250 copies / mL). A negative result must be combined with clinical  observations, patient history, and epidemiological information. If result is POSITIVE SARS-CoV-2 target nucleic acids are DETECTED. The SARS-CoV-2 RNA is generally detectable in upper and lower  respiratory specimens dur ing the acute phase of infection.  Positive  results are indicative of active infection with SARS-CoV-2.  Clinical  correlation with  patient history and other diagnostic information is  necessary to determine patient infection status.  Positive results do  not rule out bacterial infection or co-infection with other viruses. If result is PRESUMPTIVE POSTIVE SARS-CoV-2 nucleic acids MAY BE PRESENT.   A presumptive positive result was obtained on the submitted specimen  and confirmed on repeat testing.  While 2019 novel coronavirus  (SARS-CoV-2) nucleic acids may be present in the submitted sample  additional confirmatory testing may be necessary for epidemiological  and / or clinical management purposes  to differentiate between  SARS-CoV-2 and other Sarbecovirus currently known to infect humans.  If clinically indicated additional testing with an alternate test  methodology [phone]) is advised. The SARS-CoV-2 RNA is generally  detectable in upper and lower respiratory sp ecimens during the acute  phase of infection. The expected result is Negative. Fact Sheet for Patients:  StrictlyIdeas.no Fact Sheet for Healthcare Providers: BankingDealers.co.za This test is not yet approved or cleared by the [place] FDA and has been authorized for detection and/or diagnosis of SARS-CoV-2 by FDA under an Emergency Use Authorization (EUA).  This EUA will remain in effect (meaning this test can be used) for the duration of the COVID-19 declaration under Section 564(b)(1) of the Act, 21 U.S.C. section 360bbb-3(b)(1), unless the authorization is terminated or revoked sooner. Performed at [facility], [address], [place], [place] [address]   Blood Culture (routine x 2)     Status: None (Preliminary result)   Collection Time: 12/26/18 12:12 PM   Specimen: BLOOD LEFT HAND  Result Value Ref Range Status   Specimen Description BLOOD LEFT HAND  Final   Special Requests   Final    BOTTLES DRAWN AEROBIC AND ANAEROBIC Blood Culture results may not be optimal due to an inadequate  volume of blood received in culture bottles   Culture   Final    NO GROWTH 2 DAYS Performed at [facility], [address], [place], [place] [address]    Report Status PENDING  Incomplete  Blood Culture (routine x 2)     Status: None (Preliminary result)   Collection Time: 12/26/18 12:12 PM   Specimen: BLOOD LEFT HAND  Result Value Ref Range Status   Specimen Description BLOOD LEFT HAND  Final   Special Requests   Final  BOTTLES DRAWN AEROBIC AND ANAEROBIC Blood Culture results may not be optimal due to an inadequate volume of blood received in culture bottles   Culture   Final    NO GROWTH 2 DAYS Performed at [facility], [address] [place], [place] [address]    Report Status PENDING  Incomplete  MRSA PCR Screening     Status: None   Collection Time: 12/26/18  8:18 PM   Specimen: Nasal Mucosa; Nasopharyngeal  Result Value Ref Range Status   MRSA by PCR NEGATIVE NEGATIVE Final    Comment:        The GeneXpert MRSA Assay (FDA approved for NASAL specimens only), is one component of a comprehensive MRSA colonization surveillance program. It is not intended to diagnose MRSA infection nor to guide or monitor treatment for MRSA infections. Performed at [facility], [address], [place], [place] [address]    Studies/Results: Dg Chest Port 1 View  Result Date: 12/29/2018 CLINICAL DATA:  Pulmonary edema. EXAM: PORTABLE CHEST 1 VIEW COMPARISON:  12/27/2018 FINDINGS: Persistent basilar chest densities could represent a combination of pleural effusions and atelectasis, left side greater than right. Heart size remains enlarged. Asymmetric prominent interstitial lung markings, right side greater than left, are similar to the previous examination. Negative for a pneumothorax. Trachea is midline. IMPRESSION: 1. Stable chest radiograph findings. Minimal change since 12/27/2018. 2. Persistent basilar chest densities could represent a combination of pleural effusions and atelectasis.  3. Asymmetric interstitial lung densities may represent mild edema. Electronically Signed   By: Markus Daft M.D.   On: 12/29/2018 11:48    Medications:  Prior to Admission:  Medications Prior to Admission  Medication Sig Dispense Refill Last Dose  . albuterol (PROVENTIL) (2.5 MG/3ML) 0.083% nebulizer solution Take 2.5 mg by nebulization every 6 (six) hours as needed for wheezing or shortness of breath.   unknown  . amiodarone (PACERONE) 200 MG tablet Take 200 mg by mouth daily.   unknown  . diltiazem (CARDIZEM CD) 180 MG 24 hr capsule Take 1 capsule (180 mg total) by mouth daily. 30 capsule 1 unknown  . furosemide (LASIX) 40 MG tablet Take 1 tablet (40 mg total) by mouth daily as needed for fluid. (Patient taking differently: Take 60 mg by mouth daily. )   unknown  . metFORMIN (GLUCOPHAGE-XR) 500 MG 24 hr tablet Take 2 tablets (1,000 mg total) by mouth 2 (two) times daily with a meal. 120 tablet 1 unknown  . metoprolol tartrate (LOPRESSOR) 25 MG tablet Take 1 tablet (25 mg total) by mouth 2 (two) times daily. 60 tablet 0 unknonw  . nitrofurantoin (MACRODANTIN) 100 MG capsule Take 100 mg by mouth 2 (two) times daily.   unknown  . potassium chloride SA (KLOR-CON) 20 MEQ tablet Take 1 tablet (20 mEq total) by mouth daily as needed (take only when taking lasix). (Patient taking differently: Take 20 mEq by mouth 2 (two) times daily. )   unknown  . predniSONE (DELTASONE) 20 MG tablet Take 2 PO QAM x5 days (Patient taking differently: Take 40 mg by mouth daily with breakfast. Take 2 PO QAM x5 days) 10 tablet 0 unknown  . QUEtiapine (SEROQUEL) 25 MG tablet Take 1 tablet (25 mg total) by mouth daily at 6 PM. 30 tablet 3 unknown  . traMADol (ULTRAM) 50 MG tablet Take 50 mg by mouth 3 (three) times daily.   unknown  . acetaminophen (TYLENOL) 500 MG tablet Take 500 mg by mouth every 8 (eight) hours as needed for  mild pain or headache.    unknown  . rivaroxaban (XARELTO) 20 MG TABS tablet Take 1 tablet (20 mg  total) by mouth daily with supper. Stop Xarelto until Monday (08/26/18) in anticipation for kyphoplasty (Patient not taking: Reported on 12/26/2018) 21 tablet 0 Not Taking at Unknown time   Scheduled: . amiodarone  200 mg Oral Daily  . budesonide (PULMICORT) nebulizer solution  0.25 mg Nebulization BID  . Chlorhexidine Gluconate Cloth  6 each Topical Daily  . diltiazem  180 mg Oral Daily  . furosemide  40 mg Intravenous Q12H  . insulin aspart  0-20 Units Subcutaneous Q4H  . insulin aspart  3 Units Subcutaneous TID WC  . insulin glargine  10 Units Subcutaneous Q24H  . ipratropium-albuterol  3 mL Nebulization Q6H  . methylPREDNISolone (SOLU-MEDROL) injection  60 mg Intravenous Q6H  . metoprolol tartrate  25 mg Oral BID  . pantoprazole (PROTONIX) IV  40 mg Intravenous Q24H  . QUEtiapine  25 mg Oral q1800  . rivaroxaban  20 mg Oral Q supper   Continuous: . sodium chloride Stopped (12/27/18 2243)  . meropenem (MERREM) IV 1 g (12/29/18 0602)   [id number] **OR** acetaminophen, haloperidol lactate, ondansetron **OR** ondansetron (ZOFRAN) IV, senna-docusate  Assesment: She was admitted with COPD exacerbation and acute on chronic hypoxic and hypercapnic respiratory failure.  She is responded well to BiPAP.  She has dementia and has sundowning which makes her more difficult to help in the evenings because she gets agitated and resistant to treatment  She has some element of CHF and that seems better Active Problems:   COPD (chronic obstructive pulmonary disease) (HCC)   COPD with acute exacerbation (HCC)   Acute on chronic respiratory failure (HCC)   Diabetes mellitus ([place])   Chronic respiratory failure with hypoxia (HCC)   Leukocytosis   Breast cancer ([place])   Osteoporosis   Vitamin D deficiency   Anemia   Bronchiectasis with acute exacerbation (HCC)   Dementia with behavioral disturbance ([place])   Acute respiratory failure with hypoxia and hypercapnia ([place])   On home O2   CO2  narcosis   Acute and chronic respiratory failure with hypercapnia (HCC)   Pulmonary edema   Elevated brain natriuretic peptide (BNP) level    Plan: Continue treatments.  She does seem to be improving slowly    LOS: 3 days   Alonza Bogus 12/29/2018, 12:39 PM

## 2018-12-29 NOTE — Progress Notes (Signed)
Patient recurrently begins as cordial and agreeable but quickly turns against treatments and assessments during early evening hours

## 2018-12-29 NOTE — Progress Notes (Signed)
PROGRESS NOTE  Eileen [name]  [id number]  DOB: 01-10-1946  DOA: 12/26/2018 PCP: Lavella Lemons, PA  Brief Admission Hx: 73 y.o. female with advanced end stage COPD, dementia, atrial flutter, hypertension, type 2 diabetes mellitus, right breast cancer, multiple recent hospitalizations for frailty, progressive lung disease and GI bleeding presented by EMS with weakness and encephalopathy.   MDM/Assessment & Plan:   1. Acute on chronic respiratory failure with hypercapnia-patient continues to require BiPAP therapy overnight but slowly improving.  Appreciate assistance of pulmonologist Dr. Luan Pulling with management.  The patient is slowly improving but remains high risk for decompensation. 2. Right basilar pneumonia- patient likely aspirated and antibiotics have been changed to meropenem.  3. Pulmonary edema- continue IV Lasix for diuresis, monitor weights intake and output and electrolytes. 4. CO2 narcosis-this is being treated with BiPAP therapy.  Her confusion is improving.   5. Dementia with behavioral disturbances- Haldol ordered as needed for symptoms. 6. Type 2 diabetes mellitus-she has hyperglycemia and we have increased her basal coverage and will continue SSI and prandial coverage. 7. Atrial flutter- patient is on IV metoprolol and Xarelto.  Now that she is taking p.o. we will resume her oral metoprolol. 8. Goals of care-anticipate palliative medicine consult on Monday if still in hospital.  DVT prophylaxis: Xarelto Code Status: Full Family Communication: phone call to daughter Disposition Plan: Continue stepdown ICU due to bipap needs   Consultants:  Pulmonology  Procedures:  BiPAP  Antimicrobials:  Meropenem  Subjective: Patient was again on BiPAP overnight.   She remains short of breath but reports slight improvement.  Objective: Vitals:   12/29/18 0819 12/29/18 0827 12/29/18 0900 12/29/18 0934  BP:   136/71   Pulse:   (!) 103   Resp:      Temp:    97.9  F (36.6 C)  TempSrc:    Oral  SpO2: 96% 97% (!) 88%   Weight:      Height:        Intake/Output Summary (Last 24 hours) at 12/29/2018 0945 Last data filed at 12/29/2018 0500 Gross per 24 hour  Intake 560 ml  Output 800 ml  Net -240 ml   Filed Weights   12/26/18 1052 12/27/18 0500 12/28/18 0500  Weight: 67 kg 67.5 kg 66.6 kg   REVIEW OF SYSTEMS  As in HPI, otherwise negative  Exam:   General exam: Awake, arousable, speaking in full sentences.  Off bipap, receiving neb treatment. Respiratory system: Diffuse poor air movement with Rales in the right lower lobe.  Mild increased work of breathing. Cardiovascular system: Irregularly irregular S1 & S2 heard. No JVD, murmurs, gallops, clicks or pedal edema. Gastrointestinal system: Abdomen is nondistended, soft and nontender. Normal bowel sounds heard. Central nervous system: Alert and confused. No focal neurological deficits. Extremities: 1+ edema bilateral lower extremities.  Data Reviewed: Basic Metabolic Panel: Recent Labs  Lab 12/26/18 1041 12/27/18 0438 12/28/18 0454 12/29/18 0452  NA 141 143 142 140  K 4.3 4.5 4.0 3.9  CL 87* 93* 89* 88*  CO2 45* 39* 43* 42*  GLUCOSE 225* 283* 115* 184*  BUN 8 10 17 22   CREATININE 0.52 0.59 0.55 0.55  CALCIUM 9.8 8.8* 8.6* 8.6*  MG  --  2.0 1.9 2.0   Liver Function Tests: Recent Labs  Lab 12/26/18 1041 12/27/18 0438 12/28/18 0454 12/29/18 0452  AST 17 12* 13* 13*  ALT 15 15 14 15   ALKPHOS 80 66 58 60  BILITOT 0.6 0.6 0.5 0.7  PROT 6.5 5.7* 5.5* 5.7*  ALBUMIN 3.3* 2.9* 2.9* 3.0*   No results for input(s): LIPASE, AMYLASE in the last 168 hours. Recent Labs  Lab 12/26/18 1043  AMMONIA 24   CBC: Recent Labs  Lab 12/26/18 1041 12/27/18 0438 12/28/18 0454 12/29/18 0452  WBC 12.6* 10.4 15.6* 12.8*  NEUTROABS 10.4* 9.3* 14.3* 11.7*  HGB 9.0* 7.7* 7.5* 7.7*  HCT 33.4* 28.8* 27.1* 28.3*  MCV 99.7 99.0 97.5 96.9  PLT 318 277 292 286   Cardiac Enzymes: No  results for input(s): CKTOTAL, CKMB, CKMBINDEX, TROPONINI in the last 168 hours. CBG (last 3)  Recent Labs    12/28/18 1657 12/28/18 2105 12/29/18 0817  GLUCAP 118* 248* 176*   Recent Results (from the past 240 hour(s))  Urine Culture     Status: Abnormal   Collection Time: 12/26/18 10:43 AM   Specimen: Urine, Catheterized  Result Value Ref Range Status   Specimen Description   Final    URINE, CATHETERIZED Performed at [facility], [address] [place], [place] [address]    Special Requests   Final    NONE Performed at [facility], [address], [place], [place] [address]    Culture (A)  Final    >=100,000 COLONIES/mL ESCHERICHIA COLI Confirmed Extended Spectrum Beta-Lactamase Producer (ESBL).  In bloodstream infections from ESBL organisms, carbapenems are preferred over piperacillin/tazobactam. They are shown to have a lower risk of mortality.    Report Status 12/29/2018 FINAL  Final   Organism ID, Bacteria ESCHERICHIA COLI (A)  Final      Susceptibility   Escherichia coli - MIC*    AMPICILLIN >=32 RESISTANT Resistant     CEFAZOLIN >=64 RESISTANT Resistant     CEFTRIAXONE >=64 RESISTANT Resistant     CIPROFLOXACIN >=4 RESISTANT Resistant     GENTAMICIN <=1 SENSITIVE Sensitive     IMIPENEM <=0.25 SENSITIVE Sensitive     NITROFURANTOIN 32 SENSITIVE Sensitive     TRIMETH/SULFA <=20 SENSITIVE Sensitive     AMPICILLIN/SULBACTAM >=32 RESISTANT Resistant     PIP/TAZO <=4 SENSITIVE Sensitive     Extended ESBL POSITIVE Resistant     * >=100,000 COLONIES/mL ESCHERICHIA COLI  SARS Coronavirus 2 by RT PCR (hospital order, performed in [place] hospital lab) Nasopharyngeal Nasopharyngeal Swab     Status: None   Collection Time: 12/26/18 10:46 AM   Specimen: Nasopharyngeal Swab  Result Value Ref Range Status   SARS Coronavirus 2 NEGATIVE NEGATIVE Final    Comment: (NOTE) If result is NEGATIVE SARS-CoV-2 target nucleic acids are NOT DETECTED. The SARS-CoV-2 RNA is  generally detectable in upper and lower  respiratory specimens during the acute phase of infection. The lowest  concentration of SARS-CoV-2 viral copies this assay can detect is 250  copies / mL. A negative result does not preclude SARS-CoV-2 infection  and should not be used as the sole basis for treatment or other  patient management decisions.  A negative result may occur with  improper specimen collection / handling, submission of specimen other  than nasopharyngeal swab, presence of viral mutation(s) within the  areas targeted by this assay, and inadequate number of viral copies  (<250 copies / mL). A negative result must be combined with clinical  observations, patient history, and epidemiological information. If result is POSITIVE SARS-CoV-2 target nucleic acids are DETECTED. The SARS-CoV-2 RNA is generally detectable in upper and lower  respiratory specimens dur ing the acute phase of infection.  Positive  results are indicative of active  infection with SARS-CoV-2.  Clinical  correlation with patient history and other diagnostic information is  necessary to determine patient infection status.  Positive results do  not rule out bacterial infection or co-infection with other viruses. If result is PRESUMPTIVE POSTIVE SARS-CoV-2 nucleic acids MAY BE PRESENT.   A presumptive positive result was obtained on the submitted specimen  and confirmed on repeat testing.  While 2019 novel coronavirus  (SARS-CoV-2) nucleic acids may be present in the submitted sample  additional confirmatory testing may be necessary for epidemiological  and / or clinical management purposes  to differentiate between  SARS-CoV-2 and other Sarbecovirus currently known to infect humans.  If clinically indicated additional testing with an alternate test  methodology [phone]) is advised. The SARS-CoV-2 RNA is generally  detectable in upper and lower respiratory sp ecimens during the acute  phase of infection.  The expected result is Negative. Fact Sheet for Patients:  StrictlyIdeas.no Fact Sheet for Healthcare Providers: BankingDealers.co.za This test is not yet approved or cleared by the [place] FDA and has been authorized for detection and/or diagnosis of SARS-CoV-2 by FDA under an Emergency Use Authorization (EUA).  This EUA will remain in effect (meaning this test can be used) for the duration of the COVID-19 declaration under Section 564(b)(1) of the Act, 21 U.S.C. section 360bbb-3(b)(1), unless the authorization is terminated or revoked sooner. Performed at [facility], [address] [place], [place] [address]   Blood Culture (routine x 2)     Status: None (Preliminary result)   Collection Time: 12/26/18 12:12 PM   Specimen: BLOOD LEFT HAND  Result Value Ref Range Status   Specimen Description BLOOD LEFT HAND  Final   Special Requests   Final    BOTTLES DRAWN AEROBIC AND ANAEROBIC Blood Culture results may not be optimal due to an inadequate volume of blood received in culture bottles   Culture   Final    NO GROWTH 2 DAYS Performed at [facility], [address], [place], [place] [address]    Report Status PENDING  Incomplete  Blood Culture (routine x 2)     Status: None (Preliminary result)   Collection Time: 12/26/18 12:12 PM   Specimen: BLOOD LEFT HAND  Result Value Ref Range Status   Specimen Description BLOOD LEFT HAND  Final   Special Requests   Final    BOTTLES DRAWN AEROBIC AND ANAEROBIC Blood Culture results may not be optimal due to an inadequate volume of blood received in culture bottles   Culture   Final    NO GROWTH 2 DAYS Performed at [facility], [address] [place], [place] [address]    Report Status PENDING  Incomplete  MRSA PCR Screening     Status: None   Collection Time: 12/26/18  8:18 PM   Specimen: Nasal Mucosa; Nasopharyngeal  Result Value Ref Range Status   MRSA by PCR NEGATIVE NEGATIVE Final     Comment:        The GeneXpert MRSA Assay (FDA approved for NASAL specimens only), is one component of a comprehensive MRSA colonization surveillance program. It is not intended to diagnose MRSA infection nor to guide or monitor treatment for MRSA infections. Performed at [facility], [address] [place], [place] [address]      Studies: No results found. Scheduled Meds: . amiodarone  200 mg Oral Daily  . budesonide (PULMICORT) nebulizer solution  0.25 mg Nebulization BID  . Chlorhexidine Gluconate Cloth  6 each Topical Daily  .  diltiazem  180 mg Oral Daily  . furosemide  40 mg Intravenous Q12H  . insulin aspart  0-20 Units Subcutaneous Q4H  . insulin aspart  3 Units Subcutaneous TID WC  . insulin glargine  10 Units Subcutaneous Q24H  . ipratropium-albuterol  3 mL Nebulization Q6H  . methylPREDNISolone (SOLU-MEDROL) injection  60 mg Intravenous Q6H  . metoprolol tartrate  25 mg Oral BID  . pantoprazole (PROTONIX) IV  40 mg Intravenous Q24H  . QUEtiapine  25 mg Oral q1800  . rivaroxaban  20 mg Oral Q supper   Continuous Infusions: . sodium chloride Stopped (12/27/18 2243)  . meropenem (MERREM) IV 1 g (12/29/18 0602)    Active Problems:   COPD (chronic obstructive pulmonary disease) (HCC)   COPD with acute exacerbation (HCC)   Acute on chronic respiratory failure (HCC)   Diabetes mellitus (HCC)   Chronic respiratory failure with hypoxia (HCC)   Leukocytosis   Breast cancer (HCC)   Osteoporosis   Vitamin D deficiency   Anemia   Bronchiectasis with acute exacerbation (HCC)   Dementia with behavioral disturbance (HCC)   Acute respiratory failure with hypoxia and hypercapnia (HCC)   On home O2   CO2 narcosis   Acute and chronic respiratory failure with hypercapnia (HCC)   Pulmonary edema   Elevated brain natriuretic peptide (BNP) level  Critical care time spent: 31 minutes  Irwin Brakeman, MD [facility] 12/29/2018, 9:45 AM    LOS: 3 days  How  to contact the [facility] Attending or Consulting provider [place] or covering provider during after hours [place], for this patient?  1. Check the care team in [facility] and look for a) attending/consulting [facility] provider listed and b) the [facility] team listed 2. Log into [email or web address] and use [place]'s universal password to access. If you do not have the password, please contact the hospital operator. 3. Locate the [facility] provider you are looking for under [facility] and page to a number that you can be directly reached. 4. If you still have difficulty reaching the provider, please page the [facility] (Director on Call) for the Hospitalists listed on amion for assistance.

## 2018-12-30 DIAGNOSIS — R7989 Other specified abnormal findings of blood chemistry: Secondary | ICD-10-CM

## 2018-12-30 LAB — CBC WITH DIFFERENTIAL/PLATELET
Abs Immature Granulocytes: 0.06 10*3/uL (ref 0.00–0.07)
Basophils Absolute: 0 10*3/uL (ref 0.0–0.1)
Basophils Relative: 0 %
Eosinophils Absolute: 0 10*3/uL (ref 0.0–0.5)
Eosinophils Relative: 0 %
HCT: 28.8 % — ABNORMAL LOW (ref 36.0–46.0)
Hemoglobin: 8 g/dL — ABNORMAL LOW (ref 12.0–15.0)
Immature Granulocytes: 1 %
Lymphocytes Relative: 4 %
Lymphs Abs: 0.4 10*3/uL — ABNORMAL LOW (ref 0.7–4.0)
MCH: 26.8 pg (ref 26.0–34.0)
MCHC: 27.8 g/dL — ABNORMAL LOW (ref 30.0–36.0)
MCV: 96.6 fL (ref 80.0–100.0)
Monocytes Absolute: 0.5 10*3/uL (ref 0.1–1.0)
Monocytes Relative: 5 %
Neutro Abs: 10.2 10*3/uL — ABNORMAL HIGH (ref 1.7–7.7)
Neutrophils Relative %: 90 %
Platelets: 265 10*3/uL (ref 150–400)
RBC: 2.98 MIL/uL — ABNORMAL LOW (ref 3.87–5.11)
RDW: 15.1 % (ref 11.5–15.5)
WBC: 11.2 10*3/uL — ABNORMAL HIGH (ref 4.0–10.5)
nRBC: 0 % (ref 0.0–0.2)

## 2018-12-30 LAB — COMPREHENSIVE METABOLIC PANEL
ALT: 18 U/L (ref 0–44)
AST: 13 U/L — ABNORMAL LOW (ref 15–41)
Albumin: 3.2 g/dL — ABNORMAL LOW (ref 3.5–5.0)
Alkaline Phosphatase: 60 U/L (ref 38–126)
Anion gap: 13 (ref 5–15)
BUN: 24 mg/dL — ABNORMAL HIGH (ref 8–23)
CO2: 43 mmol/L — ABNORMAL HIGH (ref 22–32)
Calcium: 8.7 mg/dL — ABNORMAL LOW (ref 8.9–10.3)
Chloride: 86 mmol/L — ABNORMAL LOW (ref 98–111)
Creatinine, Ser: 0.62 mg/dL (ref 0.44–1.00)
GFR calc Af Amer: >60 mL/min (ref >60)
GFR calc non Af Amer: >60 mL/min (ref >60)
Glucose, Bld: 233 mg/dL — ABNORMAL HIGH (ref 70–99)
Potassium: 4 mmol/L (ref 3.5–5.1)
Sodium: 142 mmol/L (ref 135–145)
Total Bilirubin: 0.5 mg/dL (ref 0.3–1.2)
Total Protein: 5.7 g/dL — ABNORMAL LOW (ref 6.5–8.1)

## 2018-12-30 LAB — GLUCOSE, CAPILLARY
Glucose-Capillary: 187 mg/dL — ABNORMAL HIGH (ref 70–99)
Glucose-Capillary: 200 mg/dL — ABNORMAL HIGH (ref 70–99)
Glucose-Capillary: 287 mg/dL — ABNORMAL HIGH (ref 70–99)
Glucose-Capillary: 299 mg/dL — ABNORMAL HIGH (ref 70–99)
Glucose-Capillary: 314 mg/dL — ABNORMAL HIGH (ref 70–99)
Glucose-Capillary: 93 mg/dL (ref 70–99)

## 2018-12-30 MED ORDER — METHYLPREDNISOLONE SODIUM SUCC 125 MG IJ SOLR
60.0000 mg | Freq: Three times a day (TID) | INTRAMUSCULAR | Status: DC
  Administered 2018-12-30 – 2018-12-31 (×3): 60 mg via INTRAVENOUS
  Filled 2018-12-30 (×3): qty 2

## 2018-12-30 MED ORDER — INSULIN ASPART 100 UNIT/ML ~~LOC~~ SOLN
5.0000 [IU] | Freq: Three times a day (TID) | SUBCUTANEOUS | Status: DC
  Administered 2018-12-30 (×2): 5 [IU] via SUBCUTANEOUS

## 2018-12-30 MED ORDER — HYDROCODONE-ACETAMINOPHEN 5-325 MG PO TABS
2.0000 | ORAL_TABLET | Freq: Once | ORAL | Status: AC
  Administered 2018-12-30: 2 via ORAL
  Filled 2018-12-30: qty 2

## 2018-12-30 MED ORDER — INSULIN ASPART 100 UNIT/ML ~~LOC~~ SOLN
0.0000 [IU] | SUBCUTANEOUS | Status: DC
  Administered 2018-12-30 – 2018-12-31 (×2): 11 [IU] via SUBCUTANEOUS
  Administered 2018-12-31: 4 [IU] via SUBCUTANEOUS
  Administered 2018-12-31: 7 [IU] via SUBCUTANEOUS
  Administered 2018-12-31: 15 [IU] via SUBCUTANEOUS
  Administered 2019-01-01: 4 [IU] via SUBCUTANEOUS
  Administered 2019-01-01: 3 [IU] via SUBCUTANEOUS
  Administered 2019-01-01: 11 [IU] via SUBCUTANEOUS
  Administered 2019-01-01: 20 [IU] via SUBCUTANEOUS
  Administered 2019-01-02: 11 [IU] via SUBCUTANEOUS
  Administered 2019-01-02: 4 [IU] via SUBCUTANEOUS

## 2018-12-30 MED ORDER — INSULIN GLARGINE 100 UNIT/ML ~~LOC~~ SOLN
12.0000 [IU] | SUBCUTANEOUS | Status: DC
  Administered 2018-12-30: 12 [IU] via SUBCUTANEOUS
  Filled 2018-12-30: qty 0.12

## 2018-12-30 MED ORDER — INSULIN ASPART 100 UNIT/ML ~~LOC~~ SOLN
6.0000 [IU] | Freq: Three times a day (TID) | SUBCUTANEOUS | Status: DC
  Administered 2018-12-30: 6 [IU] via SUBCUTANEOUS

## 2018-12-30 NOTE — Progress Notes (Signed)
Haldol prn given for agitation.

## 2018-12-30 NOTE — Progress Notes (Signed)
Pharmacy Antibiotic Note  Eileen [name] is a 73 y.o. female admitted on 12/26/2018 with aspiration  pneumonia.  Pharmacy has been consulted for Merrem dosing. Patient is improving, WBC improving. Afebrile. In addition to aspiration PNA, pt with ESBL E. Coli UTI.    Plan: Continue Merrem 1gm IV q8h F/U cxs and clinical progress Monitor V/S, labs  Height: 5\' 2"  (157.5 cm) Weight: 140 lb 6.9 oz (63.7 kg) IBW/kg (Calculated) : 50.1  Temp (24hrs), Avg:97.9 F (36.6 C), Min:97.6 F (36.4 C), Max:98.7 F (37.1 C)  Recent Labs  Lab 12/26/18 1041 12/26/18 1212 12/26/18 1404 12/27/18 0438 12/28/18 0454 12/29/18 0452 12/30/18 0341  WBC 12.6*  --   --  10.4 15.6* 12.8* 11.2*  CREATININE 0.52  --   --  0.59 0.55 0.55 0.62  LATICACIDVEN  --  2.0* 1.7  --   --   --   --     Estimated Creatinine Clearance: 54.1 mL/min (by C-G formula based on SCr of 0.62 mg/dL).    Allergies  Allergen Reactions  . Codeine Other (See Comments)    "jittery"    Antimicrobials this admission: Merrem 10/16>> >> Ceftriaxone 10/15>>10/16 Azithromycin 10/15>> 10/16  Dose adjustments this admission: n/a  Microbiology results: 0/15 BCx: ngtd 10/15 UCx: >100K CFU/ml E.Coli s- imipenem, septra, nitrofuratoin 10/15 MRSA PCR: neg 9/24: UCX: ESBL E.COLI  S- imipenem  Thank you for allowing pharmacy to be a part of this patient's care.  Isac Sarna, BS Pharm D, [place] Clinical Pharmacist Pager [phone] 12/30/2018 8:54 AM

## 2018-12-30 NOTE — Progress Notes (Signed)
Subjective: She says she feels fairly well.  She is wearing BiPAP.  No other new complaints.  Objective: Vital signs in last 24 hours: Temp:  [97.6 F (36.4 C)-98.1 F (36.7 C)] 97.6 F (36.4 C) (10/19 0400) Pulse Rate:  [62-103] 64 (10/19 0700) Resp:  [13-29] 21 (10/19 0700) BP: (89-136)/(52-82) 122/71 (10/19 0700) SpO2:  [88 %-100 %] 92 % (10/19 0700) FiO2 (%):  [40 %-50 %] 40 % (10/19 0144) Weight:  [63.7 kg] 63.7 kg (10/19 0500) Weight change:     Intake/Output from previous day: 10/18 0701 - 10/19 0700 In: 1168.2 [P.O.:960; IV Piggyback:208.2] Out: 2451 [Urine:2450; Stool:1]  PHYSICAL EXAM General appearance: alert, cooperative and no distress Resp: rhonchi bilaterally Cardio: regular rate and rhythm, S1, S2 normal, no murmur, click, rub or gallop GI: soft, non-tender; bowel sounds normal; no masses,  no organomegaly Extremities: extremities normal, atraumatic, no cyanosis or edema  Lab Results:  Results for orders placed or performed during the hospital encounter of 12/26/18 (from the past 48 hour(s))  Glucose, capillary     Status: Abnormal   Collection Time: 12/28/18  7:41 AM  Result Value Ref Range   Glucose-Capillary 117 (H) 70 - 99 mg/dL  Glucose, capillary     Status: Abnormal   Collection Time: 12/28/18 11:21 AM  Result Value Ref Range   Glucose-Capillary 282 (H) 70 - 99 mg/dL  Glucose, capillary     Status: Abnormal   Collection Time: 12/28/18  4:57 PM  Result Value Ref Range   Glucose-Capillary 118 (H) 70 - 99 mg/dL  Glucose, capillary     Status: Abnormal   Collection Time: 12/28/18  9:05 PM  Result Value Ref Range   Glucose-Capillary 248 (H) 70 - 99 mg/dL  Comprehensive metabolic panel     Status: Abnormal   Collection Time: 12/29/18  4:52 AM  Result Value Ref Range   Sodium 140 135 - 145 mmol/L   Potassium 3.9 3.5 - 5.1 mmol/L   Chloride 88 (L) 98 - 111 mmol/L   CO2 42 (H) 22 - 32 mmol/L   Glucose, Bld 184 (H) 70 - 99 mg/dL   BUN 22 8 - 23  mg/dL   Creatinine, Ser 0.55 0.44 - 1.00 mg/dL   Calcium 8.6 (L) 8.9 - 10.3 mg/dL   Total Protein 5.7 (L) 6.5 - 8.1 g/dL   Albumin 3.0 (L) 3.5 - 5.0 g/dL   AST 13 (L) 15 - 41 U/L   ALT 15 0 - 44 U/L   Alkaline Phosphatase 60 38 - 126 U/L   Total Bilirubin 0.7 0.3 - 1.2 mg/dL   GFR calc non Af Amer >60 >60 mL/min   GFR calc Af Amer >60 >60 mL/min   Anion gap 10 5 - 15    Comment: Performed at [facility], [address], [place], [place] [address]  Magnesium     Status: None   Collection Time: 12/29/18  4:52 AM  Result Value Ref Range   Magnesium 2.0 1.7 - 2.4 mg/dL    Comment: Performed at [facility], [address] [place],  [address]  CBC WITH DIFFERENTIAL     Status: Abnormal   Collection Time: 12/29/18  4:52 AM  Result Value Ref Range   WBC 12.8 (H) 4.0 - 10.5 K/uL   RBC 2.92 (L) 3.87 - 5.11 MIL/uL   Hemoglobin 7.7 (L) 12.0 - 15.0 g/dL   HCT 28.3 (L) 36.0 - 46.0 %   MCV 96.9 80.0 - 100.0 fL  MCH 26.4 26.0 - 34.0 pg   MCHC 27.2 (L) 30.0 - 36.0 g/dL   RDW 15.2 11.5 - 15.5 %   Platelets 286 150 - 400 K/uL   nRBC 0.0 0.0 - 0.2 %   Neutrophils Relative % 90 %   Neutro Abs 11.7 (H) 1.7 - 7.7 K/uL   Lymphocytes Relative 4 %   Lymphs Abs 0.5 (L) 0.7 - 4.0 K/uL   Monocytes Relative 5 %   Monocytes Absolute 0.6 0.1 - 1.0 K/uL   Eosinophils Relative 0 %   Eosinophils Absolute 0.0 0.0 - 0.5 K/uL   Basophils Relative 0 %   Basophils Absolute 0.0 0.0 - 0.1 K/uL   Immature Granulocytes 1 %   Abs Immature Granulocytes 0.08 (H) 0.00 - 0.07 K/uL    Comment: Performed at [facility], [address] [place], [place] [address]  Brain natriuretic peptide     Status: Abnormal   Collection Time: 12/29/18  4:52 AM  Result Value Ref Range   B Natriuretic Peptide 582.0 (H) 0.0 - 100.0 pg/mL    Comment: Performed at [facility], [address] [place], [place] [address]  Glucose, capillary     Status: Abnormal   Collection Time: 12/29/18  8:17 AM  Result Value Ref Range    Glucose-Capillary 176 (H) 70 - 99 mg/dL  Glucose, capillary     Status: Abnormal   Collection Time: 12/29/18 11:57 AM  Result Value Ref Range   Glucose-Capillary 334 (H) 70 - 99 mg/dL  Glucose, capillary     Status: Abnormal   Collection Time: 12/29/18  4:17 PM  Result Value Ref Range   Glucose-Capillary 111 (H) 70 - 99 mg/dL  Glucose, capillary     Status: Abnormal   Collection Time: 12/29/18  8:11 PM  Result Value Ref Range   Glucose-Capillary 220 (H) 70 - 99 mg/dL  Glucose, capillary     Status: Abnormal   Collection Time: 12/30/18 12:16 AM  Result Value Ref Range   Glucose-Capillary 299 (H) 70 - 99 mg/dL  Comprehensive metabolic panel     Status: Abnormal   Collection Time: 12/30/18  3:41 AM  Result Value Ref Range   Sodium 142 135 - 145 mmol/L   Potassium 4.0 3.5 - 5.1 mmol/L   Chloride 86 (L) 98 - 111 mmol/L   CO2 43 (H) 22 - 32 mmol/L   Glucose, Bld 233 (H) 70 - 99 mg/dL   BUN 24 (H) 8 - 23 mg/dL   Creatinine, Ser 0.62 0.44 - 1.00 mg/dL   Calcium 8.7 (L) 8.9 - 10.3 mg/dL   Total Protein 5.7 (L) 6.5 - 8.1 g/dL   Albumin 3.2 (L) 3.5 - 5.0 g/dL   AST 13 (L) 15 - 41 U/L   ALT 18 0 - 44 U/L   Alkaline Phosphatase 60 38 - 126 U/L   Total Bilirubin 0.5 0.3 - 1.2 mg/dL   GFR calc non Af Amer >60 >60 mL/min   GFR calc Af Amer >60 >60 mL/min   Anion gap 13 5 - 15    Comment: Performed at [facility], [address], [place], [place] [address]  CBC WITH DIFFERENTIAL     Status: Abnormal   Collection Time: 12/30/18  3:41 AM  Result Value Ref Range   WBC 11.2 (H) 4.0 - 10.5 K/uL   RBC 2.98 (L) 3.87 - 5.11 MIL/uL   Hemoglobin 8.0 (L) 12.0 - 15.0 g/dL   HCT 28.8 (L) 36.0 - 46.0 %  MCV 96.6 80.0 - 100.0 fL   MCH 26.8 26.0 - 34.0 pg   MCHC 27.8 (L) 30.0 - 36.0 g/dL   RDW 15.1 11.5 - 15.5 %   Platelets 265 150 - 400 K/uL   nRBC 0.0 0.0 - 0.2 %   Neutrophils Relative % 90 %   Neutro Abs 10.2 (H) 1.7 - 7.7 K/uL   Lymphocytes Relative 4 %   Lymphs Abs 0.4 (L) 0.7 - 4.0 K/uL    Monocytes Relative 5 %   Monocytes Absolute 0.5 0.1 - 1.0 K/uL   Eosinophils Relative 0 %   Eosinophils Absolute 0.0 0.0 - 0.5 K/uL   Basophils Relative 0 %   Basophils Absolute 0.0 0.0 - 0.1 K/uL   Immature Granulocytes 1 %   Abs Immature Granulocytes 0.06 0.00 - 0.07 K/uL    Comment: Performed at [facility], [address] [place], [place] [address]  Glucose, capillary     Status: Abnormal   Collection Time: 12/30/18  5:10 AM  Result Value Ref Range   Glucose-Capillary 200 (H) 70 - 99 mg/dL    ABGS No results for input(s): PHART, PO2ART, TCO2, HCO3 in the last 72 hours.  Invalid input(s): PCO2 CULTURES Recent Results (from the past 240 hour(s))  Urine Culture     Status: Abnormal   Collection Time: 12/26/18 10:43 AM   Specimen: Urine, Catheterized  Result Value Ref Range Status   Specimen Description   Final    URINE, CATHETERIZED Performed at [facility], [address] [place], [place] [address]    Special Requests   Final    NONE Performed at [facility], [address], [place], [place] [address]    Culture (A)  Final    >=100,000 COLONIES/mL ESCHERICHIA COLI Confirmed Extended Spectrum Beta-Lactamase Producer (ESBL).  In bloodstream infections from ESBL organisms, carbapenems are preferred over piperacillin/tazobactam. They are shown to have a lower risk of mortality.    Report Status 12/29/2018 FINAL  Final   Organism ID, Bacteria ESCHERICHIA COLI (A)  Final      Susceptibility   Escherichia coli - MIC*    AMPICILLIN >=32 RESISTANT Resistant     CEFAZOLIN >=64 RESISTANT Resistant     CEFTRIAXONE >=64 RESISTANT Resistant     CIPROFLOXACIN >=4 RESISTANT Resistant     GENTAMICIN <=1 SENSITIVE Sensitive     IMIPENEM <=0.25 SENSITIVE Sensitive     NITROFURANTOIN 32 SENSITIVE Sensitive     TRIMETH/SULFA <=20 SENSITIVE Sensitive     AMPICILLIN/SULBACTAM >=32 RESISTANT Resistant     PIP/TAZO <=4 SENSITIVE Sensitive     Extended ESBL POSITIVE Resistant     *  >=100,000 COLONIES/mL ESCHERICHIA COLI  SARS Coronavirus 2 by RT PCR (hospital order, performed in [facility] hospital lab) Nasopharyngeal Nasopharyngeal Swab     Status: None   Collection Time: 12/26/18 10:46 AM   Specimen: Nasopharyngeal Swab  Result Value Ref Range Status   SARS Coronavirus 2 NEGATIVE NEGATIVE Final    Comment: (NOTE) If result is NEGATIVE SARS-CoV-2 target nucleic acids are NOT DETECTED. The SARS-CoV-2 RNA is generally detectable in upper and lower  respiratory specimens during the acute phase of infection. The lowest  concentration of SARS-CoV-2 viral copies this assay can detect is 250  copies / mL. A negative result does not preclude SARS-CoV-2 infection  and should not be used as the sole basis for treatment or other  patient management decisions.  A negative result may occur with  improper specimen collection /  handling, submission of specimen other  than nasopharyngeal swab, presence of viral mutation(s) within the  areas targeted by this assay, and inadequate number of viral copies  (<250 copies / mL). A negative result must be combined with clinical  observations, patient history, and epidemiological information. If result is POSITIVE SARS-CoV-2 target nucleic acids are DETECTED. The SARS-CoV-2 RNA is generally detectable in upper and lower  respiratory specimens dur ing the acute phase of infection.  Positive  results are indicative of active infection with SARS-CoV-2.  Clinical  correlation with patient history and other diagnostic information is  necessary to determine patient infection status.  Positive results do  not rule out bacterial infection or co-infection with other viruses. If result is PRESUMPTIVE POSTIVE SARS-CoV-2 nucleic acids MAY BE PRESENT.   A presumptive positive result was obtained on the submitted specimen  and confirmed on repeat testing.  While 2019 novel coronavirus  (SARS-CoV-2) nucleic acids may be present in the submitted  sample  additional confirmatory testing may be necessary for epidemiological  and / or clinical management purposes  to differentiate between  SARS-CoV-2 and other Sarbecovirus currently known to infect humans.  If clinically indicated additional testing with an alternate test  methodology [phone]) is advised. The SARS-CoV-2 RNA is generally  detectable in upper and lower respiratory sp ecimens during the acute  phase of infection. The expected result is Negative. Fact Sheet for Patients:  StrictlyIdeas.no Fact Sheet for Healthcare Providers: BankingDealers.co.za This test is not yet approved or cleared by the [place] FDA and has been authorized for detection and/or diagnosis of SARS-CoV-2 by FDA under an Emergency Use Authorization (EUA).  This EUA will remain in effect (meaning this test can be used) for the duration of the COVID-19 declaration under Section 564(b)(1) of the Act, 21 U.S.C. section 360bbb-3(b)(1), unless the authorization is terminated or revoked sooner. Performed at [facility], [address] [place], [place] [address]   Blood Culture (routine x 2)     Status: None (Preliminary result)   Collection Time: 12/26/18 12:12 PM   Specimen: BLOOD LEFT HAND  Result Value Ref Range Status   Specimen Description BLOOD LEFT HAND  Final   Special Requests   Final    BOTTLES DRAWN AEROBIC AND ANAEROBIC Blood Culture results may not be optimal due to an inadequate volume of blood received in culture bottles   Culture   Final    NO GROWTH 2 DAYS Performed at [facility], [address] [place], [place] [address]    Report Status PENDING  Incomplete  Blood Culture (routine x 2)     Status: None (Preliminary result)   Collection Time: 12/26/18 12:12 PM   Specimen: BLOOD LEFT HAND  Result Value Ref Range Status   Specimen Description BLOOD LEFT HAND  Final   Special Requests   Final    BOTTLES DRAWN AEROBIC AND ANAEROBIC  Blood Culture results may not be optimal due to an inadequate volume of blood received in culture bottles   Culture   Final    NO GROWTH 2 DAYS Performed at [facility], [address], [place], [place] [address]    Report Status PENDING  Incomplete  MRSA PCR Screening     Status: None   Collection Time: 12/26/18  8:18 PM   Specimen: Nasal Mucosa; Nasopharyngeal  Result Value Ref Range Status   MRSA by PCR NEGATIVE NEGATIVE Final    Comment:        The GeneXpert MRSA Assay (  FDA approved for NASAL specimens only), is one component of a comprehensive MRSA colonization surveillance program. It is not intended to diagnose MRSA infection nor to guide or monitor treatment for MRSA infections. Performed at [facility], [address] [place], [place] [address]    Studies/Results: Dg Chest Port 1 View  Result Date: 12/29/2018 CLINICAL DATA:  Pulmonary edema. EXAM: PORTABLE CHEST 1 VIEW COMPARISON:  12/27/2018 FINDINGS: Persistent basilar chest densities could represent a combination of pleural effusions and atelectasis, left side greater than right. Heart size remains enlarged. Asymmetric prominent interstitial lung markings, right side greater than left, are similar to the previous examination. Negative for a pneumothorax. Trachea is midline. IMPRESSION: 1. Stable chest radiograph findings. Minimal change since 12/27/2018. 2. Persistent basilar chest densities could represent a combination of pleural effusions and atelectasis. 3. Asymmetric interstitial lung densities may represent mild edema. Electronically Signed   By: Markus Daft M.D.   On: 12/29/2018 11:48    Medications:  Prior to Admission:  Medications Prior to Admission  Medication Sig Dispense Refill Last Dose  . albuterol (PROVENTIL) (2.5 MG/3ML) 0.083% nebulizer solution Take 2.5 mg by nebulization every 6 (six) hours as needed for wheezing or shortness of breath.   unknown  . amiodarone (PACERONE) 200 MG tablet Take 200 mg by  mouth daily.   unknown  . diltiazem (CARDIZEM CD) 180 MG 24 hr capsule Take 1 capsule (180 mg total) by mouth daily. 30 capsule 1 unknown  . furosemide (LASIX) 40 MG tablet Take 1 tablet (40 mg total) by mouth daily as needed for fluid. (Patient taking differently: Take 60 mg by mouth daily. )   unknown  . metFORMIN (GLUCOPHAGE-XR) 500 MG 24 hr tablet Take 2 tablets (1,000 mg total) by mouth 2 (two) times daily with a meal. 120 tablet 1 unknown  . metoprolol tartrate (LOPRESSOR) 25 MG tablet Take 1 tablet (25 mg total) by mouth 2 (two) times daily. 60 tablet 0 unknonw  . nitrofurantoin (MACRODANTIN) 100 MG capsule Take 100 mg by mouth 2 (two) times daily.   unknown  . potassium chloride SA (KLOR-CON) 20 MEQ tablet Take 1 tablet (20 mEq total) by mouth daily as needed (take only when taking lasix). (Patient taking differently: Take 20 mEq by mouth 2 (two) times daily. )   unknown  . predniSONE (DELTASONE) 20 MG tablet Take 2 PO QAM x5 days (Patient taking differently: Take 40 mg by mouth daily with breakfast. Take 2 PO QAM x5 days) 10 tablet 0 unknown  . QUEtiapine (SEROQUEL) 25 MG tablet Take 1 tablet (25 mg total) by mouth daily at 6 PM. 30 tablet 3 unknown  . traMADol (ULTRAM) 50 MG tablet Take 50 mg by mouth 3 (three) times daily.   unknown  . acetaminophen (TYLENOL) 500 MG tablet Take 500 mg by mouth every 8 (eight) hours as needed for mild pain or headache.    unknown  . rivaroxaban (XARELTO) 20 MG TABS tablet Take 1 tablet (20 mg total) by mouth daily with supper. Stop Xarelto until Monday (08/26/18) in anticipation for kyphoplasty (Patient not taking: Reported on 12/26/2018) 21 tablet 0 Not Taking at Unknown time   Scheduled: . amiodarone  200 mg Oral Daily  . budesonide (PULMICORT) nebulizer solution  0.25 mg Nebulization BID  . Chlorhexidine Gluconate Cloth  6 each Topical Daily  . diltiazem  180 mg Oral Daily  . furosemide  40 mg Intravenous Q12H  . insulin aspart  0-20 Units Subcutaneous  Q4H  .  insulin aspart  5 Units Subcutaneous TID WC  . insulin glargine  12 Units Subcutaneous Q24H  . ipratropium-albuterol  3 mL Nebulization Q6H  . methylPREDNISolone (SOLU-MEDROL) injection  60 mg Intravenous Q8H  . metoprolol tartrate  25 mg Oral BID  . pantoprazole (PROTONIX) IV  40 mg Intravenous Q24H  . QUEtiapine  25 mg Oral q1800  . rivaroxaban  20 mg Oral Q supper   Continuous: . sodium chloride Stopped (12/27/18 2243)  . meropenem (MERREM) IV 1 g (12/30/18 0536)   [id number] **OR** acetaminophen, haloperidol lactate, ondansetron **OR** ondansetron (ZOFRAN) IV, senna-docusate  Assesment: She has COPD with exacerbation.  She has pneumonia likely from aspiration and she is being treated for that.  She has acute on chronic hypoxic and hypercapnic respiratory failure and she has been on BiPAP and tolerated that fairly well.  At home apparently she may be having some problem with her oxygen concentrator plus she has had very long tubing and that may be part of the problem with what happens when she gets home.  She has dementia which complicates her situation because she gets confused and starts trying to resist treatment particularly at sundown  She had what appeared to be some pulmonary edema and that is better   Active Problems:   COPD (chronic obstructive pulmonary disease) (HCC)   COPD with acute exacerbation (HCC)   Acute on chronic respiratory failure (HCC)   Diabetes mellitus ([place])   Chronic respiratory failure with hypoxia (HCC)   Leukocytosis   Breast cancer ([place])   Osteoporosis   Vitamin D deficiency   Anemia   Bronchiectasis with acute exacerbation ([place])   Dementia with behavioral disturbance ([place])   Acute respiratory failure with hypoxia and hypercapnia ([place])   On home O2   CO2 narcosis   Acute and chronic respiratory failure with hypercapnia ([place])   Pulmonary edema   Elevated brain natriuretic peptide (BNP) level    Plan: Continue treatment     LOS: 4 days   Alonza Bogus 12/30/2018, 7:28 AM

## 2018-12-30 NOTE — Progress Notes (Signed)
PROGRESS NOTE  Eileen [name]  [id number]  DOB: [birth year]  DOA: 12/26/2018 PCP: Lavella Lemons, PA  Brief Admission Hx: 73 y.o. female with advanced end stage COPD, dementia, atrial flutter, hypertension, type 2 diabetes mellitus, right breast cancer, multiple recent hospitalizations for frailty, progressive lung disease and GI bleeding presented by EMS with weakness and encephalopathy.   MDM/Assessment & Plan:   1. Acute on chronic respiratory failure with hypercapnia-patient continues to require BiPAP therapy overnight but slowly improving.  Appreciate assistance of pulmonologist Dr. Luan Pulling with management.  The patient is slowly improving but remains high risk for decompensation.  She is still requiring bipap therapy.  2. Right basilar pneumonia- patient likely aspirated and antibiotics have been changed to meropenem.  3. Pulmonary edema- continue IV Lasix for diuresis, monitor weights intake and output and electrolytes. 4. CO2 narcosis-this is being treated with BiPAP therapy.  Her confusion is improving.   5. Dementia with behavioral disturbances- Haldol ordered as needed for symptoms. 6. Type 2 diabetes mellitus-she has hyperglycemia and we have increased her basal coverage and will continue SSI and prandial coverage. 7. Atrial flutter- patient is on IV metoprolol and Xarelto.  Now that she is taking p.o. we will resume her oral metoprolol. 8. Goals of care-anticipate palliative medicine consult on Monday if still in hospital.  DVT prophylaxis: Xarelto Code Status: Full Family Communication: phone call to daughter Disposition Plan: Continue stepdown ICU due to bipap needs  Consultants:  Pulmonology  Procedures:  BiPAP  Antimicrobials:  Meropenem  Subjective: Patient was again on BiPAP overnight.   She remains short of breath but reports slight improvement.  Objective: Vitals:   12/30/18 1000 12/30/18 1125 12/30/18 1241 12/30/18 1300  BP: 110/61  (!) 116/56  106/61  Pulse: 92 79  74  Resp: (!) 28 18 20 14   Temp:  98.4 F (36.9 C)    TempSrc:  Oral    SpO2: 97% 100%  100%  Weight:      Height:        Intake/Output Summary (Last 24 hours) at 12/30/2018 1358 Last data filed at 12/30/2018 1300 Gross per 24 hour  Intake 568.17 ml  Output 3100 ml  Net -2531.83 ml   Filed Weights   12/27/18 0500 12/28/18 0500 12/30/18 0500  Weight: 67.5 kg 66.6 kg 63.7 kg   REVIEW OF SYSTEMS  As in HPI, otherwise negative  Exam:   General exam: Awake, arousable, speaking in full sentences.  Off bipap, receiving neb treatment. Respiratory system: Diffuse poor air movement with Rales in the right lower lobe.  Mild increased work of breathing. Cardiovascular system: Irregularly irregular S1 & S2 heard. No JVD, murmurs, gallops, clicks or pedal edema. Gastrointestinal system: Abdomen is nondistended, soft and nontender. Normal bowel sounds heard. Central nervous system: Alert and confused. No focal neurological deficits. Extremities: 1+ edema bilateral lower extremities.  Data Reviewed: Basic Metabolic Panel: Recent Labs  Lab 12/26/18 1041 12/27/18 0438 12/28/18 0454 12/29/18 0452 12/30/18 0341  NA 141 143 142 140 142  K 4.3 4.5 4.0 3.9 4.0  CL 87* 93* 89* 88* 86*  CO2 45* 39* 43* 42* 43*  GLUCOSE 225* 283* 115* 184* 233*  BUN 8 10 17 22  24*  CREATININE 0.52 0.59 0.55 0.55 0.62  CALCIUM 9.8 8.8* 8.6* 8.6* 8.7*  MG  --  2.0 1.9 2.0  --    Liver Function Tests: Recent Labs  Lab 12/26/18 1041 12/27/18 0438 12/28/18 0454 12/29/18 0452 12/30/18 0341  AST  17 12* 13* 13* 13*  ALT 15 15 14 15 18   ALKPHOS 80 66 58 60 60  BILITOT 0.6 0.6 0.5 0.7 0.5  PROT 6.5 5.7* 5.5* 5.7* 5.7*  ALBUMIN 3.3* 2.9* 2.9* 3.0* 3.2*   No results for input(s): LIPASE, AMYLASE in the last 168 hours. Recent Labs  Lab 12/26/18 1043  AMMONIA 24   CBC: Recent Labs  Lab 12/26/18 1041 12/27/18 0438 12/28/18 0454 12/29/18 0452 12/30/18 0341  WBC 12.6*  10.4 15.6* 12.8* 11.2*  NEUTROABS 10.4* 9.3* 14.3* 11.7* 10.2*  HGB 9.0* 7.7* 7.5* 7.7* 8.0*  HCT 33.4* 28.8* 27.1* 28.3* 28.8*  MCV 99.7 99.0 97.5 96.9 96.6  PLT 318 277 292 286 265   Cardiac Enzymes: No results for input(s): CKTOTAL, CKMB, CKMBINDEX, TROPONINI in the last 168 hours. CBG (last 3)  Recent Labs    12/30/18 0510 12/30/18 0733 12/30/18 1123  GLUCAP 200* 187* 314*   Recent Results (from the past 240 hour(s))  Urine Culture     Status: Abnormal   Collection Time: 12/26/18 10:43 AM   Specimen: Urine, Catheterized  Result Value Ref Range Status   Specimen Description   Final    URINE, CATHETERIZED Performed at [facility], [address], [place], [place] [address]    Special Requests   Final    NONE Performed at [facility], [address], [place], [place] [address]    Culture (A)  Final    >=100,000 COLONIES/mL ESCHERICHIA COLI Confirmed Extended Spectrum Beta-Lactamase Producer (ESBL).  In bloodstream infections from ESBL organisms, carbapenems are preferred over piperacillin/tazobactam. They are shown to have a lower risk of mortality.    Report Status 12/29/2018 FINAL  Final   Organism ID, Bacteria ESCHERICHIA COLI (A)  Final      Susceptibility   Escherichia coli - MIC*    AMPICILLIN >=32 RESISTANT Resistant     CEFAZOLIN >=64 RESISTANT Resistant     CEFTRIAXONE >=64 RESISTANT Resistant     CIPROFLOXACIN >=4 RESISTANT Resistant     GENTAMICIN <=1 SENSITIVE Sensitive     IMIPENEM <=0.25 SENSITIVE Sensitive     NITROFURANTOIN 32 SENSITIVE Sensitive     TRIMETH/SULFA <=20 SENSITIVE Sensitive     AMPICILLIN/SULBACTAM >=32 RESISTANT Resistant     PIP/TAZO <=4 SENSITIVE Sensitive     Extended ESBL POSITIVE Resistant     * >=100,000 COLONIES/mL ESCHERICHIA COLI  SARS Coronavirus 2 by RT PCR (hospital order, performed in [place] hospital lab) Nasopharyngeal Nasopharyngeal Swab     Status: None   Collection Time: 12/26/18 10:46 AM   Specimen:  Nasopharyngeal Swab  Result Value Ref Range Status   SARS Coronavirus 2 NEGATIVE NEGATIVE Final    Comment: (NOTE) If result is NEGATIVE SARS-CoV-2 target nucleic acids are NOT DETECTED. The SARS-CoV-2 RNA is generally detectable in upper and lower  respiratory specimens during the acute phase of infection. The lowest  concentration of SARS-CoV-2 viral copies this assay can detect is 250  copies / mL. A negative result does not preclude SARS-CoV-2 infection  and should not be used as the sole basis for treatment or other  patient management decisions.  A negative result may occur with  improper specimen collection / handling, submission of specimen other  than nasopharyngeal swab, presence of viral mutation(s) within the  areas targeted by this assay, and inadequate number of viral copies  (<250 copies / mL). A negative result must be combined with clinical  observations, patient history, and epidemiological information. If  result is POSITIVE SARS-CoV-2 target nucleic acids are DETECTED. The SARS-CoV-2 RNA is generally detectable in upper and lower  respiratory specimens dur ing the acute phase of infection.  Positive  results are indicative of active infection with SARS-CoV-2.  Clinical  correlation with patient history and other diagnostic information is  necessary to determine patient infection status.  Positive results do  not rule out bacterial infection or co-infection with other viruses. If result is PRESUMPTIVE POSTIVE SARS-CoV-2 nucleic acids MAY BE PRESENT.   A presumptive positive result was obtained on the submitted specimen  and confirmed on repeat testing.  While 2019 novel coronavirus  (SARS-CoV-2) nucleic acids may be present in the submitted sample  additional confirmatory testing may be necessary for epidemiological  and / or clinical management purposes  to differentiate between  SARS-CoV-2 and other Sarbecovirus currently known to infect humans.  If clinically  indicated additional testing with an alternate test  methodology [phone]) is advised. The SARS-CoV-2 RNA is generally  detectable in upper and lower respiratory sp ecimens during the acute  phase of infection. The expected result is Negative. Fact Sheet for Patients:  StrictlyIdeas.no Fact Sheet for Healthcare Providers: BankingDealers.co.za This test is not yet approved or cleared by the [place] FDA and has been authorized for detection and/or diagnosis of SARS-CoV-2 by FDA under an Emergency Use Authorization (EUA).  This EUA will remain in effect (meaning this test can be used) for the duration of the COVID-19 declaration under Section 564(b)(1) of the Act, 21 U.S.C. section 360bbb-3(b)(1), unless the authorization is terminated or revoked sooner. Performed at [facility], [address], [place], [place] [address]   Blood Culture (routine x 2)     Status: None (Preliminary result)   Collection Time: 12/26/18 12:12 PM   Specimen: BLOOD LEFT HAND  Result Value Ref Range Status   Specimen Description BLOOD LEFT HAND  Final   Special Requests   Final    BOTTLES DRAWN AEROBIC AND ANAEROBIC Blood Culture results may not be optimal due to an inadequate volume of blood received in culture bottles   Culture   Final    NO GROWTH 4 DAYS Performed at [facility], [address], [place], [place] [address]    Report Status PENDING  Incomplete  Blood Culture (routine x 2)     Status: None (Preliminary result)   Collection Time: 12/26/18 12:12 PM   Specimen: BLOOD LEFT HAND  Result Value Ref Range Status   Specimen Description BLOOD LEFT HAND  Final   Special Requests   Final    BOTTLES DRAWN AEROBIC AND ANAEROBIC Blood Culture results may not be optimal due to an inadequate volume of blood received in culture bottles   Culture   Final    NO GROWTH 4 DAYS Performed at [facility], [address], [place], [place] [address]    Report  Status PENDING  Incomplete  MRSA PCR Screening     Status: None   Collection Time: 12/26/18  8:18 PM   Specimen: Nasal Mucosa; Nasopharyngeal  Result Value Ref Range Status   MRSA by PCR NEGATIVE NEGATIVE Final    Comment:        The GeneXpert MRSA Assay (FDA approved for NASAL specimens only), is one component of a comprehensive MRSA colonization surveillance program. It is not intended to diagnose MRSA infection nor to guide or monitor treatment for MRSA infections. Performed at [facility], [address] [place], [place] [address]  Studies: Dg Chest Port 1 View  Result Date: 12/29/2018 CLINICAL DATA:  Pulmonary edema. EXAM: PORTABLE CHEST 1 VIEW COMPARISON:  12/27/2018 FINDINGS: Persistent basilar chest densities could represent a combination of pleural effusions and atelectasis, left side greater than right. Heart size remains enlarged. Asymmetric prominent interstitial lung markings, right side greater than left, are similar to the previous examination. Negative for a pneumothorax. Trachea is midline. IMPRESSION: 1. Stable chest radiograph findings. Minimal change since 12/27/2018. 2. Persistent basilar chest densities could represent a combination of pleural effusions and atelectasis. 3. Asymmetric interstitial lung densities may represent mild edema. Electronically Signed   By: Markus Daft M.D.   On: 12/29/2018 11:48   Scheduled Meds: . amiodarone  200 mg Oral Daily  . budesonide (PULMICORT) nebulizer solution  0.25 mg Nebulization BID  . Chlorhexidine Gluconate Cloth  6 each Topical Daily  . diltiazem  180 mg Oral Daily  . furosemide  40 mg Intravenous Q12H  . insulin aspart  0-20 Units Subcutaneous Q4H  . insulin aspart  5 Units Subcutaneous TID WC  . insulin glargine  12 Units Subcutaneous Q24H  . ipratropium-albuterol  3 mL Nebulization Q6H  . methylPREDNISolone (SOLU-MEDROL) injection  60 mg Intravenous Q8H  . metoprolol tartrate  25 mg Oral BID  . pantoprazole  (PROTONIX) IV  40 mg Intravenous Q24H  . QUEtiapine  25 mg Oral q1800  . rivaroxaban  20 mg Oral Q supper   Continuous Infusions: . sodium chloride Stopped (12/27/18 2243)  . meropenem (MERREM) IV 1 g (12/30/18 1318)    Active Problems:   COPD (chronic obstructive pulmonary disease) (HCC)   COPD with acute exacerbation (HCC)   Acute on chronic respiratory failure (HCC)   Diabetes mellitus (HCC)   Chronic respiratory failure with hypoxia (HCC)   Leukocytosis   Breast cancer (HCC)   Osteoporosis   Vitamin D deficiency   Anemia   Bronchiectasis with acute exacerbation (HCC)   Dementia with behavioral disturbance (HCC)   Acute respiratory failure with hypoxia and hypercapnia (HCC)   On home O2   CO2 narcosis   Acute and chronic respiratory failure with hypercapnia (HCC)   Pulmonary edema   Elevated brain natriuretic peptide (BNP) level  Critical care time spent: 30 minutes  Irwin Brakeman, MD [facility] 12/30/2018, 1:58 PM    LOS: 4 days  How to contact the [facility] Attending or Consulting provider [place] or covering provider during after hours [place], for this patient?  1. Check the care team in [facility] and look for a) attending/consulting TRH provider listed and b) the [facility] team listed 2. Log into [email or web address] and use [place]'s universal password to access. If you do not have the password, please contact the hospital operator. 3. Locate the [facility] provider you are looking for under [facility] and page to a number that you can be directly reached. 4. If you still have difficulty reaching the provider, please page the [facility] (Director on Call) for the Hospitalists listed on amion for assistance.

## 2018-12-30 NOTE — Progress Notes (Signed)
Physical Therapy Treatment Patient Details Name: Eileen [name] MRN: [id number] DOB: [birth year] Today's Date: 12/30/2018    History of Present Illness Eileen A Pat is a 73 y.o. female with advanced end stage COPD, dementia, atrial flutter, hypertension, type 2 diabetes mellitus, right breast cancer, multiple recent hospitalizations for frailty, progressive lung disease and GI bleeding presented by EMS with weakness and encephalopathy.  The patient has been having increasing shortness of breath and mental status changes over the past several days.  Paramedics found patient to have an oxygen saturation of 80% and was nearly obtunded.  Of note, paramedics were not sure patient was receiving oxygen as she had 50 foot tubing in place and an oxygen concentrator that could not be confirmed was working properly.  She apparently has been having cough and chest congestion over the past several days.  She has been weak and has fallen down at home injuring her right ankle.    PT Comments    Patient demonstrates increased endurance/distance for ambulation demonstrating slow labored cadence without loss of balance, limited mostly due to c/o fatigue while on 6 LPM O2 with SpO2 above 90% when returning to chair.  Patient tolerated sitting up in chair after therapy - RN notified.  Patient will benefit from continued physical therapy in hospital and recommended venue below to increase strength, balance, endurance for safe ADLs and gait.    Follow Up Recommendations  SNF;Supervision for mobility/OOB;Supervision/Assistance - 24 hour     Equipment Recommendations  None recommended by PT    Recommendations for Other Services       Precautions / Restrictions Precautions Precautions: Fall Restrictions Weight Bearing Restrictions: No    Mobility  Bed Mobility Overal bed mobility: Needs Assistance Bed Mobility: Supine to Sit     Supine to sit: Supervision;Min guard     General bed mobility  comments: increased time, slightly labored movement  Transfers Overall transfer level: Needs assistance Equipment used: Rolling walker (2 wheeled) Transfers: Sit to/from [facility] Sit to Stand: Min guard;Min assist Stand pivot transfers: Min guard;Min assist       General transfer comment: slow labored movement  Ambulation/Gait   Gait Distance (Feet): 22 Feet Assistive device: Rolling walker (2 wheeled) Gait Pattern/deviations: Decreased step length - right;Decreased step length - left;Decreased stride length Gait velocity: decreased   General Gait Details: increased endurance/distance for ambulation with slow labored cadence, no loss of balance, limited secondary to c/o fatigue, on 6 LPM O2   Stairs             Wheelchair Mobility    Modified Rankin (Stroke Patients Only)       Balance Overall balance assessment: Needs assistance Sitting-balance support: Feet supported;No upper extremity supported Sitting balance-Leahy Scale: Good Sitting balance - Comments: seated at bedside   Standing balance support: During functional activity;Bilateral upper extremity supported Standing balance-Leahy Scale: Fair Standing balance comment: using RW                            Cognition Arousal/Alertness: Awake/alert   Overall Cognitive Status: History of cognitive impairments - at baseline                                        Exercises General Exercises - Lower Extremity Long Arc Quad: Seated;Strengthening;Both;AROM;10 reps Hip Flexion/Marching: Seated;AROM;Strengthening;Both;10 reps Toe Raises: Seated;AROM;Strengthening;Both;10 reps  Heel Raises: Seated;AROM;Strengthening;Both;10 reps    General Comments        Pertinent Vitals/Pain Pain Assessment: No/denies pain    Home Living                      Prior Function            PT Goals (current goals can now be found in the care plan section) Acute  Rehab PT Goals Patient Stated Goal: return home with family to assist PT Goal Formulation: With patient Time For Goal Achievement: 01/10/19 Potential to Achieve Goals: Good Progress towards PT goals: Progressing toward goals    Frequency    Min 3X/week      PT Plan Current plan remains appropriate    Co-evaluation              AM-PAC PT "6 Clicks" Mobility   Outcome Measure  Help needed turning from your back to your side while in a flat bed without using bedrails?: None Help needed moving from lying on your back to sitting on the side of a flat bed without using bedrails?: None Help needed moving to and from a bed to a chair (including a wheelchair)?: A Little Help needed standing up from a chair using your arms (e.g., wheelchair or bedside chair)?: A Little Help needed to walk in hospital room?: A Lot Help needed climbing 3-5 steps with a railing? : A Lot 6 Click Score: 18    End of Session Equipment Utilized During Treatment: Oxygen Activity Tolerance: Patient tolerated treatment well;Patient limited by fatigue Patient left: in chair;with call bell/phone within reach;with chair alarm set Nurse Communication: Mobility status PT Visit Diagnosis: Unsteadiness on feet (R26.81);Other abnormalities of gait and mobility (R26.89);Muscle weakness (generalized) (M62.81)     Time: [id number] PT Time Calculation (min) (ACUTE ONLY): 25 min  Charges:  $Therapeutic Exercise: 8-22 mins $Therapeutic Activity: 8-22 mins                     1:49 PM, 12/30/18 Lonell Grandchild, MPT Physical Therapist with [facility] [phone] office [phone] mobile phone

## 2018-12-30 NOTE — Progress Notes (Signed)
Palliative Medicine RN Note: Unexpected delay in return of PMT provider to [facility]. We expect a provider to return tomorrow, 10/20. If recommendations are needed in the interim, please call our office between [place] at [phone].  Marjie Skiff Shanitra Phillippi, RN, BSN, [facility] Palliative Medicine Team 12/30/2018 2:39 PM Office [phone]

## 2018-12-30 NOTE — Care Management Important Message (Signed)
Important Message  Patient Details  Name: Eileen [name] MRN: [id number] Date of Birth: 12-22-1945   Medicare Important Message Given:  Yes     [name] 12/30/2018, 1:59 PM

## 2018-12-31 LAB — COMPREHENSIVE METABOLIC PANEL
ALT: 14 U/L (ref 0–44)
AST: 9 U/L — ABNORMAL LOW (ref 15–41)
Albumin: 3 g/dL — ABNORMAL LOW (ref 3.5–5.0)
Alkaline Phosphatase: 62 U/L (ref 38–126)
Anion gap: 12 (ref 5–15)
BUN: 25 mg/dL — ABNORMAL HIGH (ref 8–23)
CO2: 44 mmol/L — ABNORMAL HIGH (ref 22–32)
Calcium: 8.4 mg/dL — ABNORMAL LOW (ref 8.9–10.3)
Chloride: 84 mmol/L — ABNORMAL LOW (ref 98–111)
Creatinine, Ser: 0.5 mg/dL (ref 0.44–1.00)
GFR calc Af Amer: >60 mL/min (ref >60)
GFR calc non Af Amer: >60 mL/min (ref >60)
Glucose, Bld: 293 mg/dL — ABNORMAL HIGH (ref 70–99)
Potassium: 3.3 mmol/L — ABNORMAL LOW (ref 3.5–5.1)
Sodium: 140 mmol/L (ref 135–145)
Total Bilirubin: 0.4 mg/dL (ref 0.3–1.2)
Total Protein: 5.5 g/dL — ABNORMAL LOW (ref 6.5–8.1)

## 2018-12-31 LAB — CBC WITH DIFFERENTIAL/PLATELET
Abs Immature Granulocytes: 0.07 10*3/uL (ref 0.00–0.07)
Basophils Absolute: 0 10*3/uL (ref 0.0–0.1)
Basophils Relative: 0 %
Eosinophils Absolute: 0 10*3/uL (ref 0.0–0.5)
Eosinophils Relative: 0 %
HCT: 28.7 % — ABNORMAL LOW (ref 36.0–46.0)
Hemoglobin: 8 g/dL — ABNORMAL LOW (ref 12.0–15.0)
Immature Granulocytes: 1 %
Lymphocytes Relative: 3 %
Lymphs Abs: 0.3 10*3/uL — ABNORMAL LOW (ref 0.7–4.0)
MCH: 26.4 pg (ref 26.0–34.0)
MCHC: 27.9 g/dL — ABNORMAL LOW (ref 30.0–36.0)
MCV: 94.7 fL (ref 80.0–100.0)
Monocytes Absolute: 0.4 10*3/uL (ref 0.1–1.0)
Monocytes Relative: 4 %
Neutro Abs: 8.9 10*3/uL — ABNORMAL HIGH (ref 1.7–7.7)
Neutrophils Relative %: 92 %
Platelets: 272 10*3/uL (ref 150–400)
RBC: 3.03 MIL/uL — ABNORMAL LOW (ref 3.87–5.11)
RDW: 15 % (ref 11.5–15.5)
WBC: 9.7 10*3/uL (ref 4.0–10.5)
nRBC: 0 % (ref 0.0–0.2)

## 2018-12-31 LAB — MAGNESIUM: Magnesium: 2 mg/dL (ref 1.7–2.4)

## 2018-12-31 LAB — GLUCOSE, CAPILLARY
Glucose-Capillary: 281 mg/dL — ABNORMAL HIGH (ref 70–99)
Glucose-Capillary: 218 mg/dL — ABNORMAL HIGH (ref 70–99)
Glucose-Capillary: 339 mg/dL — ABNORMAL HIGH (ref 70–99)
Glucose-Capillary: 152 mg/dL — ABNORMAL HIGH (ref 70–99)
Glucose-Capillary: 141 mg/dL — ABNORMAL HIGH (ref 70–99)

## 2018-12-31 LAB — CULTURE, BLOOD (ROUTINE X 2)
Culture: NO GROWTH
Culture: NO GROWTH

## 2018-12-31 MED ORDER — PANTOPRAZOLE SODIUM 40 MG PO TBEC
40.0000 mg | DELAYED_RELEASE_TABLET | Freq: Every day | ORAL | Status: DC
  Administered 2018-12-31 – 2019-01-01 (×2): 40 mg via ORAL
  Filled 2018-12-31 (×2): qty 1

## 2018-12-31 MED ORDER — METHYLPREDNISOLONE SODIUM SUCC 125 MG IJ SOLR
60.0000 mg | Freq: Two times a day (BID) | INTRAMUSCULAR | Status: DC
  Administered 2018-12-31 – 2019-01-01 (×2): 60 mg via INTRAVENOUS
  Filled 2018-12-31 (×2): qty 2

## 2018-12-31 MED ORDER — INSULIN ASPART 100 UNIT/ML ~~LOC~~ SOLN
8.0000 [IU] | Freq: Three times a day (TID) | SUBCUTANEOUS | Status: DC
  Administered 2018-12-31 – 2019-01-02 (×8): 8 [IU] via SUBCUTANEOUS

## 2018-12-31 MED ORDER — POTASSIUM CHLORIDE CRYS ER 20 MEQ PO TBCR
40.0000 meq | EXTENDED_RELEASE_TABLET | Freq: Once | ORAL | Status: AC
  Administered 2018-12-31: 40 meq via ORAL
  Filled 2018-12-31: qty 2

## 2018-12-31 MED ORDER — INSULIN GLARGINE 100 UNIT/ML ~~LOC~~ SOLN
14.0000 [IU] | SUBCUTANEOUS | Status: DC
  Administered 2018-12-31 – 2019-01-01 (×2): 14 [IU] via SUBCUTANEOUS
  Filled 2018-12-31 (×3): qty 0.14

## 2018-12-31 NOTE — Progress Notes (Signed)
PHARMACIST - PHYSICIAN COMMUNICATION  DR:   Wynetta Emery  CONCERNING: IV- to- Oral Route Change Policy  RECOMMENDATION: This patient is receiving pantoprazole by the intravenous route.  Based on criteria approved by the Pharmacy and Therapeutics Committee, the intravenous medication(s) is/are being converted to the equivalent oral dose form(s).   DESCRIPTION: These criteria include:  The patient is eating (either orally or via tube) and/or has been taking other orally administered medications for a least 24 hours  The patient has no evidence of active gastrointestinal bleeding or impaired GI absorption (gastrectomy, short bowel, patient on TNA or NPO).  If you have questions about this conversion, please contact the Pharmacy Department  [x]   [phone] )  [facility] []   [phone] )  [facility] []   [phone] )  [facility] []   [phone] )  [facility] []   [phone] )  [place], [facility] 12/31/2018 8:56 AM

## 2018-12-31 NOTE — Progress Notes (Signed)
Physical Therapy Treatment Patient Details Name: Eileen [name] MRN: [id number] DOB: [birth year] Today's Date: 12/31/2018    History of Present Illness Eileen A Depaul is a 73 y.o. female with advanced end stage COPD, dementia, atrial flutter, hypertension, type 2 diabetes mellitus, right breast cancer, multiple recent hospitalizations for frailty, progressive lung disease and GI bleeding presented by EMS with weakness and encephalopathy.  The patient has been having increasing shortness of breath and mental status changes over the past several days.  Paramedics found patient to have an oxygen saturation of 80% and was nearly obtunded.  Of note, paramedics were not sure patient was receiving oxygen as she had 50 foot tubing in place and an oxygen concentrator that could not be confirmed was working properly.  She apparently has been having cough and chest congestion over the past several days.  She has been weak and has fallen down at home injuring her right ankle.    PT Comments    Patient presents up in chair (assisted by nursing staff) and agreeable for therapy.  Patient demonstrates good return for completing BLE ROM/strengthening exercises with verbal cues and demonstration, slow labored cadence during gait training without loss of balance, able to make it out of room before having to turn around due to fatigue and SpO2 desaturation from 93% to 84% while on 3 LPM.  Patient tolerated sitting up in chair and SpO2 increased to 99% after resting.  Patient will benefit from continued physical therapy in hospital and recommended venue below to increase strength, balance, endurance for safe ADLs and gait.   Follow Up Recommendations  SNF;Supervision for mobility/OOB;Supervision/Assistance - 24 hour     Equipment Recommendations  None recommended by PT    Recommendations for Other Services       Precautions / Restrictions Precautions Precautions: Fall Restrictions Weight Bearing  Restrictions: No    Mobility  Bed Mobility               General bed mobility comments: patient presents up in chair (assisted by nsg staff)  Transfers Overall transfer level: Needs assistance Equipment used: Rolling walker (2 wheeled) Transfers: Sit to/from [facility] Sit to Stand: Min guard;Min assist Stand pivot transfers: Min guard;Min assist       General transfer comment: slow labored movement  Ambulation/Gait Ambulation/Gait assistance: Min assist Gait Distance (Feet): 30 Feet Assistive device: Rolling walker (2 wheeled) Gait Pattern/deviations: Decreased step length - right;Decreased step length - left;Decreased stride length Gait velocity: decreased   General Gait Details: slow labored cadence without loss of balance, limited secondary to c/o fatigue, on 3 LPM with SpO2 dropping from 93% to 84%   Stairs             Wheelchair Mobility    Modified Rankin (Stroke Patients Only)       Balance Overall balance assessment: Needs assistance Sitting-balance support: Feet supported;No upper extremity supported Sitting balance-Leahy Scale: Good Sitting balance - Comments: seated in chair   Standing balance support: During functional activity;Bilateral upper extremity supported Standing balance-Leahy Scale: Fair Standing balance comment: using RW                            Cognition Arousal/Alertness: Awake/alert Behavior During Therapy: WFL for tasks assessed/performed Overall Cognitive Status: History of cognitive impairments - at baseline  Exercises General Exercises - Lower Extremity Long Arc Quad: Seated;Strengthening;Both;AROM;10 reps Hip Flexion/Marching: Seated;AROM;Strengthening;Both;10 reps Toe Raises: Seated;AROM;Strengthening;Both;10 reps Heel Raises: Seated;AROM;Strengthening;Both;10 reps    General Comments        Pertinent Vitals/Pain Pain  Assessment: No/denies pain    Home Living                      Prior Function            PT Goals (current goals can now be found in the care plan section) Acute Rehab PT Goals Patient Stated Goal: return home with family to assist PT Goal Formulation: With patient Time For Goal Achievement: 01/10/19 Potential to Achieve Goals: Good Progress towards PT goals: Progressing toward goals    Frequency    Min 3X/week      PT Plan Current plan remains appropriate    Co-evaluation              AM-PAC PT "6 Clicks" Mobility   Outcome Measure  Help needed turning from your back to your side while in a flat bed without using bedrails?: None Help needed moving from lying on your back to sitting on the side of a flat bed without using bedrails?: None Help needed moving to and from a bed to a chair (including a wheelchair)?: A Little Help needed standing up from a chair using your arms (e.g., wheelchair or bedside chair)?: A Little Help needed to walk in hospital room?: A Lot Help needed climbing 3-5 steps with a railing? : A Lot 6 Click Score: 18    End of Session Equipment Utilized During Treatment: Oxygen Activity Tolerance: Patient tolerated treatment well;Patient limited by fatigue Patient left: in chair;with call bell/phone within reach;with chair alarm set Nurse Communication: Mobility status PT Visit Diagnosis: Unsteadiness on feet (R26.81);Other abnormalities of gait and mobility (R26.89);Muscle weakness (generalized) (M62.81)     Time: [id number] PT Time Calculation (min) (ACUTE ONLY): 28 min  Charges:  $Therapeutic Exercise: 8-22 mins $Therapeutic Activity: 8-22 mins                     12:34 PM, 12/31/18 Lonell Grandchild, MPT Physical Therapist with [facility] [phone] office [phone] mobile phone

## 2018-12-31 NOTE — Progress Notes (Signed)
Patient stated she does not want to wear BIPAP tonight. Unit still at bedside. Will monitor through the night.

## 2018-12-31 NOTE — Progress Notes (Addendum)
PROGRESS NOTE  Eileen [name]  [id number]  DOB: June 11, 1945  DOA: 12/26/2018 PCP: Lavella Lemons, PA  Brief Admission Hx: 73 y.o. female with advanced end stage COPD, dementia, atrial flutter, hypertension, type 2 diabetes mellitus, right breast cancer, multiple recent hospitalizations for frailty, progressive lung disease and GI bleeding presented by EMS with weakness and encephalopathy.   MDM/Assessment & Plan:    Acute on chronic respiratory failure with hypercapniapatient continues to require BiPAP therapy overnight but slowly improving.  Appreciate assistance of pulmonologist Dr. Luan Pulling with management.  The patient is slowly improving but remains high risk for decompensation.  She is still requiring bipap therapy at night.  Plan is to get patient home when she no longer has bipap need.  Right basilar pneumonia patient likely aspirated and antibiotics have been changed to meropenem. Clinically improving.   Acute diastolic heart failure Pulmonary edema continue IV Lasix for diuresis, monitor weights intake and output and electrolytes. CO2 narcosisthis is being treated with BiPAP therapy.  Her confusion is improving.   ESBL UTI - being treated with meropenem which is due to complete 10/21.   Dementia with behavioral disturbances Haldol ordered as needed for symptoms. Type 2 diabetes mellitusshe has hyperglycemia and we have increased her basal coverage and will continue SSI and prandial coverage. Atrial flutter patient was initially on IV metoprolol and Xarelto.  Now that she is taking p.o. we resumed her oral metoprolol.  Rate remains controlled. Goals of careanticipate palliative medicine consult when available.  DVT prophylaxis: Xarelto Code Status: Full Family Communication: phone call to daughter Disposition Plan: Continue stepdown ICU due to bipap needs  Consultants: Pulmonology  Procedures: BiPAP  Antimicrobials: Meropenem 10/17 >> scheduled to end 10/21   Subjective: Patient still required BiPAP overnight but now feeling like breathing is less labored.   She remains short of breath, denies chest pain  Objective: Vitals:   12/31/18 0924 12/31/18 1000 12/31/18 1100 12/31/18 1120  BP: 108/66 120/61 (!) 103/51   Pulse: 88 85 68 65  Resp: 15 17 (!) 24 (!) 23  Temp:    97.9 F (36.6 C)  TempSrc:    Oral  SpO2: 96% 97% 98% 99%  Weight:      Height:        Intake/Output Summary (Last 24 hours) at 12/31/2018 1228 Last data filed at 12/31/2018 1000 Gross per 24 hour  Intake 210.98 ml  Output 2850 ml  Net -2639.02 ml   Filed Weights   12/28/18 0500 12/30/18 0500 12/31/18 0500  Weight: 66.6 kg 63.7 kg 66.2 kg   REVIEW OF SYSTEMS  As in HPI, otherwise negative  Exam:   General exam: Awake, arousable, speaking in full sentences.  Off bipap. Less labored breathing today. Respiratory system: Diffuse poor air movement with Rales in the right lower lobe.  Mild increased work of breathing. Cardiovascular system: Irregularly irregular S1 & S2 heard. No JVD, murmurs, gallops, clicks or pedal edema. Gastrointestinal system: Abdomen is nondistended, soft and nontender. Normal bowel sounds heard. Central nervous system: Alert and confused. No focal neurological deficits. Extremities: 1+ edema bilateral lower extremities.  Data Reviewed: Basic Metabolic Panel: Recent Labs  Lab 12/27/18 0438 12/28/18 0454 12/29/18 0452 12/30/18 0341 12/31/18 0408  NA 143 142 140 142 140  K 4.5 4.0 3.9 4.0 3.3*  CL 93* 89* 88* 86* 84*  CO2 39* 43* 42* 43* 44*  GLUCOSE 283* 115* 184* 233* 293*  BUN 10 17 22  24* 25*  CREATININE 0.59 0.55  0.55 0.62 0.50  CALCIUM 8.8* 8.6* 8.6* 8.7* 8.4*  MG 2.0 1.9 2.0  --  2.0   Liver Function Tests: Recent Labs  Lab 12/27/18 0438 12/28/18 0454 12/29/18 0452 12/30/18 0341 12/31/18 0408  AST 12* 13* 13* 13* 9*  ALT 15 14 15 18 14   ALKPHOS 66 58 60 60 62  BILITOT 0.6 0.5 0.7 0.5 0.4  PROT 5.7* 5.5* 5.7* 5.7*  5.5*  ALBUMIN 2.9* 2.9* 3.0* 3.2* 3.0*   No results for input(s): LIPASE, AMYLASE in the last 168 hours. Recent Labs  Lab 12/26/18 1043  AMMONIA 24   CBC: Recent Labs  Lab 12/27/18 0438 12/28/18 0454 12/29/18 0452 12/30/18 0341 12/31/18 0408  WBC 10.4 15.6* 12.8* 11.2* 9.7  NEUTROABS 9.3* 14.3* 11.7* 10.2* 8.9*  HGB 7.7* 7.5* 7.7* 8.0* 8.0*  HCT 28.8* 27.1* 28.3* 28.8* 28.7*  MCV 99.0 97.5 96.9 96.6 94.7  PLT 277 292 286 265 272   Cardiac Enzymes: No results for input(s): CKTOTAL, CKMB, CKMBINDEX, TROPONINI in the last 168 hours. CBG (last 3)  Recent Labs    12/31/18 0258 12/31/18 0709 12/31/18 1119  GLUCAP 281* 218* 339*   Recent Results (from the past 240 hour(s))  Urine Culture     Status: Abnormal   Collection Time: 12/26/18 10:43 AM   Specimen: Urine, Catheterized  Result Value Ref Range Status   Specimen Description   Final    URINE, CATHETERIZED Performed at [facility], [address] [place], [place] [address]    Special Requests   Final    NONE Performed at [facility], [address] [place], [place] [address]    Culture (A)  Final    >=100,000 COLONIES/mL ESCHERICHIA COLI Confirmed Extended Spectrum Beta-Lactamase Producer (ESBL).  In bloodstream infections from ESBL organisms, carbapenems are preferred over piperacillin/tazobactam. They are shown to have a lower risk of mortality.    Report Status 12/29/2018 FINAL  Final   Organism ID, Bacteria ESCHERICHIA COLI (A)  Final      Susceptibility   Escherichia coli - MIC*    AMPICILLIN >=32 RESISTANT Resistant     CEFAZOLIN >=64 RESISTANT Resistant     CEFTRIAXONE >=64 RESISTANT Resistant     CIPROFLOXACIN >=4 RESISTANT Resistant     GENTAMICIN <=1 SENSITIVE Sensitive     IMIPENEM <=0.25 SENSITIVE Sensitive     NITROFURANTOIN 32 SENSITIVE Sensitive     TRIMETH/SULFA <=20 SENSITIVE Sensitive     AMPICILLIN/SULBACTAM >=32 RESISTANT Resistant     PIP/TAZO <=4 SENSITIVE Sensitive     Extended  ESBL POSITIVE Resistant     * >=100,000 COLONIES/mL ESCHERICHIA COLI  SARS Coronavirus 2 by RT PCR (hospital order, performed in [place] hospital lab) Nasopharyngeal Nasopharyngeal Swab     Status: None   Collection Time: 12/26/18 10:46 AM   Specimen: Nasopharyngeal Swab  Result Value Ref Range Status   SARS Coronavirus 2 NEGATIVE NEGATIVE Final    Comment: (NOTE) If result is NEGATIVE SARS-CoV-2 target nucleic acids are NOT DETECTED. The SARS-CoV-2 RNA is generally detectable in upper and lower  respiratory specimens during the acute phase of infection. The lowest  concentration of SARS-CoV-2 viral copies this assay can detect is 250  copies / mL. A negative result does not preclude SARS-CoV-2 infection  and should not be used as the sole basis for treatment or other  patient management decisions.  A negative result may occur with  improper specimen collection / handling, submission of specimen other  than nasopharyngeal  swab, presence of viral mutation(s) within the  areas targeted by this assay, and inadequate number of viral copies  (<250 copies / mL). A negative result must be combined with clinical  observations, patient history, and epidemiological information. If result is POSITIVE SARS-CoV-2 target nucleic acids are DETECTED. The SARS-CoV-2 RNA is generally detectable in upper and lower  respiratory specimens dur ing the acute phase of infection.  Positive  results are indicative of active infection with SARS-CoV-2.  Clinical  correlation with patient history and other diagnostic information is  necessary to determine patient infection status.  Positive results do  not rule out bacterial infection or co-infection with other viruses. If result is PRESUMPTIVE POSTIVE SARS-CoV-2 nucleic acids MAY BE PRESENT.   A presumptive positive result was obtained on the submitted specimen  and confirmed on repeat testing.  While 2019 novel coronavirus  (SARS-CoV-2) nucleic acids may  be present in the submitted sample  additional confirmatory testing may be necessary for epidemiological  and / or clinical management purposes  to differentiate between  SARS-CoV-2 and other Sarbecovirus currently known to infect humans.  If clinically indicated additional testing with an alternate test  methodology [phone]) is advised. The SARS-CoV-2 RNA is generally  detectable in upper and lower respiratory sp ecimens during the acute  phase of infection. The expected result is Negative. Fact Sheet for Patients:  StrictlyIdeas.no Fact Sheet for Healthcare Providers: BankingDealers.co.za This test is not yet approved or cleared by the [place] FDA and has been authorized for detection and/or diagnosis of SARS-CoV-2 by FDA under an Emergency Use Authorization (EUA).  This EUA will remain in effect (meaning this test can be used) for the duration of the COVID-19 declaration under Section 564(b)(1) of the Act, 21 U.S.C. section 360bbb-3(b)(1), unless the authorization is terminated or revoked sooner. Performed at [facility], [address] [place], [place] [address]   Blood Culture (routine x 2)     Status: None   Collection Time: 12/26/18 12:12 PM   Specimen: BLOOD LEFT HAND  Result Value Ref Range Status   Specimen Description BLOOD LEFT HAND  Final   Special Requests   Final    BOTTLES DRAWN AEROBIC AND ANAEROBIC Blood Culture results may not be optimal due to an inadequate volume of blood received in culture bottles   Culture   Final    NO GROWTH 5 DAYS Performed at [facility] [address] [place], [place] [address]    Report Status 12/31/2018 FINAL  Final  Blood Culture (routine x 2)     Status: None   Collection Time: 12/26/18 12:12 PM   Specimen: BLOOD LEFT HAND  Result Value Ref Range Status   Specimen Description BLOOD LEFT HAND  Final   Special Requests   Final    BOTTLES DRAWN AEROBIC AND ANAEROBIC Blood  Culture results may not be optimal due to an inadequate volume of blood received in culture bottles   Culture   Final    NO GROWTH 5 DAYS Performed at [facility], [address], [place], [place] [address]    Report Status 12/31/2018 FINAL  Final  MRSA PCR Screening     Status: None   Collection Time: 12/26/18  8:18 PM   Specimen: Nasal Mucosa; Nasopharyngeal  Result Value Ref Range Status   MRSA by PCR NEGATIVE NEGATIVE Final    Comment:        The GeneXpert MRSA Assay (FDA approved for NASAL specimens only), is one component of  a comprehensive MRSA colonization surveillance program. It is not intended to diagnose MRSA infection nor to guide or monitor treatment for MRSA infections. Performed at [facility], [address] [place], [place] [address]      Studies: No results found. Scheduled Meds:  amiodarone  200 mg Oral Daily   budesonide (PULMICORT) nebulizer solution  0.25 mg Nebulization BID   Chlorhexidine Gluconate Cloth  6 each Topical Daily   diltiazem  180 mg Oral Daily   furosemide  40 mg Intravenous Q12H   insulin aspart  0-20 Units Subcutaneous Q4H   insulin aspart  8 Units Subcutaneous TID WC   insulin glargine  14 Units Subcutaneous Q24H   ipratropium-albuterol  3 mL Nebulization Q6H   methylPREDNISolone (SOLU-MEDROL) injection  60 mg Intravenous Q12H   metoprolol tartrate  25 mg Oral BID   pantoprazole  40 mg Oral Daily   QUEtiapine  25 mg Oral q1800   rivaroxaban  20 mg Oral Q supper   Continuous Infusions:  sodium chloride Stopped (12/27/18 2243)   meropenem (MERREM) IV 1 g (12/31/18 0527)    Active Problems:   COPD (chronic obstructive pulmonary disease) (HCC)   COPD with acute exacerbation (HCC)   Acute on chronic respiratory failure (HCC)   Diabetes mellitus ([place])   Chronic respiratory failure with hypoxia (HCC)   Leukocytosis   Breast cancer (HCC)   Osteoporosis   Vitamin D deficiency   Anemia   Bronchiectasis with acute exacerbation  (HCC)   Dementia with behavioral disturbance (HCC)   Acute respiratory failure with hypoxia and hypercapnia (HCC)   On home O2   CO2 narcosis   Acute and chronic respiratory failure with hypercapnia (HCC)   Pulmonary edema   Elevated brain natriuretic peptide (BNP) level  Critical care time spent: 30 minutes  Irwin Brakeman, MD [facility] 12/31/2018, 12:28 PM    LOS: 5 days  How to contact the [facility] Attending or Consulting provider [place] or covering provider during after hours [place], for this patient?  Check the care team in [facility] and look for a) attending/consulting TRH provider listed and b) the [facility] team listed Log into [email or web address] and use [place]'s universal password to access. If you do not have the password, please contact the hospital operator. Locate the [facility] provider you are looking for under [facility] and page to a number that you can be directly reached. If you still have difficulty reaching the provider, please page the [facility] (Director on Call) for the Hospitalists listed on amion for assistance.

## 2018-12-31 NOTE — TOC Progression Note (Signed)
Transition of Care [facility]) - Progression Note    Patient Details  Name: Eileen [name] MRN: [id number] Date of Birth: 1946-02-13  [name] CM/SW Contact  Maximillion Gill, Chauncey Reading, RN Phone Number: 12/31/2018, 3:46 PM  Clinical Narrative:   Call to [facility] to discuss disposition. Plan is still for patient to go home with home health. Palliative consult pending.   [name] states patient has a sleep study scheduled for Monday, the 27th, wants to know if patient can have it prior to DC.  Unsure if this is possible, discussed with [name] the only way that could work is if patient was discharging the day of the sleep study. Did call sleep center ([phone]), left VM, maybe we could reschedule sleep study to coincide with day of DC.     Expected Discharge Plan: [place] Barriers to Discharge: Continued Medical Work up  Expected Discharge Plan and Services Expected Discharge Plan: [place]   Discharge Planning Services: CM Consult Post Acute Care Choice: [place], Resumption of Svcs/PTA Provider                               [facility] Agency: [place] Date [place]: 12/27/18 Time [place]: [id number] Representative spoke with at [place]: Georgina Snell with Alvis Lemmings   Social Determinants of Health ([place]) Interventions    Readmission Risk Interventions Readmission Risk Prevention Plan 12/11/2018 12/06/2018 11/20/2018  Transportation Screening - Complete -  PCP or Specialist Appt within 5-7 Days - - -  Home Care Screening - - -  Medication Review (RN CM) - - -  Medication Review (RN Transport planner) - Complete -  PCP or Specialist appointment within 3-5 days of discharge - Not Complete -  [place] or [place] - Complete -  SW Recovery Care/Counseling Consult - Complete -  Palliative Care Screening - Not Complete Complete  Comments - - -  [place] Not Applicable Not Complete -  Some  recent data might be hidden

## 2018-12-31 NOTE — Progress Notes (Signed)
Subjective: She says she feels okay.  No new complaints.  Her breathing is pretty good.  She is off BiPAP and back on nasal cannula  Objective: Vital signs in last 24 hours: Temp:  [97.8 F (36.6 C)-98.4 F (36.9 C)] 98 F (36.7 C) (10/20 0710) Pulse Rate:  [45-102] 77 (10/19 2248) Resp:  [14-28] 20 (10/20 0710) BP: (87-126)/(50-86) 117/61 (10/20 0700) SpO2:  [85 %-100 %] 94 % (10/20 0400) Weight:  [66.2 kg] 66.2 kg (10/20 0500) Weight change: 2.5 kg    Intake/Output from previous day: 10/19 0701 - 10/20 0700 In: 211 [IV Piggyback:211] Out: 2300 [Urine:2300]  PHYSICAL EXAM General appearance: alert, cooperative and no distress Resp: clear to auscultation bilaterally Cardio: regular rate and rhythm, S1, S2 normal, no murmur, click, rub or gallop GI: soft, non-tender; bowel sounds normal; no masses,  no organomegaly Extremities: extremities normal, atraumatic, no cyanosis or edema  Lab Results:  Results for orders placed or performed during the hospital encounter of 12/26/18 (from the past 48 hour(s))  Glucose, capillary     Status: Abnormal   Collection Time: 12/29/18  8:17 AM  Result Value Ref Range   Glucose-Capillary 176 (H) 70 - 99 mg/dL  Glucose, capillary     Status: Abnormal   Collection Time: 12/29/18 11:57 AM  Result Value Ref Range   Glucose-Capillary 334 (H) 70 - 99 mg/dL  Glucose, capillary     Status: Abnormal   Collection Time: 12/29/18  4:17 PM  Result Value Ref Range   Glucose-Capillary 111 (H) 70 - 99 mg/dL  Glucose, capillary     Status: Abnormal   Collection Time: 12/29/18  8:11 PM  Result Value Ref Range   Glucose-Capillary 220 (H) 70 - 99 mg/dL  Glucose, capillary     Status: Abnormal   Collection Time: 12/30/18 12:16 AM  Result Value Ref Range   Glucose-Capillary 299 (H) 70 - 99 mg/dL  Comprehensive metabolic panel     Status: Abnormal   Collection Time: 12/30/18  3:41 AM  Result Value Ref Range   Sodium 142 135 - 145 mmol/L   Potassium 4.0  3.5 - 5.1 mmol/L   Chloride 86 (L) 98 - 111 mmol/L   CO2 43 (H) 22 - 32 mmol/L   Glucose, Bld 233 (H) 70 - 99 mg/dL   BUN 24 (H) 8 - 23 mg/dL   Creatinine, Ser 0.62 0.44 - 1.00 mg/dL   Calcium 8.7 (L) 8.9 - 10.3 mg/dL   Total Protein 5.7 (L) 6.5 - 8.1 g/dL   Albumin 3.2 (L) 3.5 - 5.0 g/dL   AST 13 (L) 15 - 41 U/L   ALT 18 0 - 44 U/L   Alkaline Phosphatase 60 38 - 126 U/L   Total Bilirubin 0.5 0.3 - 1.2 mg/dL   GFR calc non Af Amer >60 >60 mL/min   GFR calc Af Amer >60 >60 mL/min   Anion gap 13 5 - 15    Comment: Performed at [facility], [address], [place],  [address]  CBC WITH DIFFERENTIAL     Status: Abnormal   Collection Time: 12/30/18  3:41 AM  Result Value Ref Range   WBC 11.2 (H) 4.0 - 10.5 K/uL   RBC 2.98 (L) 3.87 - 5.11 MIL/uL   Hemoglobin 8.0 (L) 12.0 - 15.0 g/dL   HCT 28.8 (L) 36.0 - 46.0 %   MCV 96.6 80.0 - 100.0 fL   MCH 26.8 26.0 - 34.0 pg   MCHC 27.8 (L)  30.0 - 36.0 g/dL   RDW 15.1 11.5 - 15.5 %   Platelets 265 150 - 400 K/uL   nRBC 0.0 0.0 - 0.2 %   Neutrophils Relative % 90 %   Neutro Abs 10.2 (H) 1.7 - 7.7 K/uL   Lymphocytes Relative 4 %   Lymphs Abs 0.4 (L) 0.7 - 4.0 K/uL   Monocytes Relative 5 %   Monocytes Absolute 0.5 0.1 - 1.0 K/uL   Eosinophils Relative 0 %   Eosinophils Absolute 0.0 0.0 - 0.5 K/uL   Basophils Relative 0 %   Basophils Absolute 0.0 0.0 - 0.1 K/uL   Immature Granulocytes 1 %   Abs Immature Granulocytes 0.06 0.00 - 0.07 K/uL    Comment: Performed at [facility], [address], [place], [place] [address]  Glucose, capillary     Status: Abnormal   Collection Time: 12/30/18  5:10 AM  Result Value Ref Range   Glucose-Capillary 200 (H) 70 - 99 mg/dL  Glucose, capillary     Status: Abnormal   Collection Time: 12/30/18  7:33 AM  Result Value Ref Range   Glucose-Capillary 187 (H) 70 - 99 mg/dL  Glucose, capillary     Status: Abnormal   Collection Time: 12/30/18 11:23 AM  Result Value Ref Range   Glucose-Capillary 314 (H)  70 - 99 mg/dL  Glucose, capillary     Status: None   Collection Time: 12/30/18  4:16 PM  Result Value Ref Range   Glucose-Capillary 93 70 - 99 mg/dL  Glucose, capillary     Status: Abnormal   Collection Time: 12/30/18  9:20 PM  Result Value Ref Range   Glucose-Capillary 287 (H) 70 - 99 mg/dL   Comment 1 Notify RN    Comment 2 Document in Chart   Glucose, capillary     Status: Abnormal   Collection Time: 12/31/18  2:58 AM  Result Value Ref Range   Glucose-Capillary 281 (H) 70 - 99 mg/dL   Comment 1 Notify RN    Comment 2 Document in Chart   Comprehensive metabolic panel     Status: Abnormal   Collection Time: 12/31/18  4:08 AM  Result Value Ref Range   Sodium 140 135 - 145 mmol/L   Potassium 3.3 (L) 3.5 - 5.1 mmol/L    Comment: DELTA CHECK NOTED   Chloride 84 (L) 98 - 111 mmol/L   CO2 44 (H) 22 - 32 mmol/L   Glucose, Bld 293 (H) 70 - 99 mg/dL   BUN 25 (H) 8 - 23 mg/dL   Creatinine, Ser 0.50 0.44 - 1.00 mg/dL   Calcium 8.4 (L) 8.9 - 10.3 mg/dL   Total Protein 5.5 (L) 6.5 - 8.1 g/dL   Albumin 3.0 (L) 3.5 - 5.0 g/dL   AST 9 (L) 15 - 41 U/L   ALT 14 0 - 44 U/L   Alkaline Phosphatase 62 38 - 126 U/L   Total Bilirubin 0.4 0.3 - 1.2 mg/dL   GFR calc non Af Amer >60 >60 mL/min   GFR calc Af Amer >60 >60 mL/min   Anion gap 12 5 - 15    Comment: Performed at [facility], [address] [place], [place] [address]  CBC WITH DIFFERENTIAL     Status: Abnormal   Collection Time: 12/31/18  4:08 AM  Result Value Ref Range   WBC 9.7 4.0 - 10.5 K/uL   RBC 3.03 (L) 3.87 - 5.11 MIL/uL   Hemoglobin 8.0 (L) 12.0 - 15.0 g/dL  HCT 28.7 (L) 36.0 - 46.0 %   MCV 94.7 80.0 - 100.0 fL   MCH 26.4 26.0 - 34.0 pg   MCHC 27.9 (L) 30.0 - 36.0 g/dL   RDW 15.0 11.5 - 15.5 %   Platelets 272 150 - 400 K/uL   nRBC 0.0 0.0 - 0.2 %   Neutrophils Relative % 92 %   Neutro Abs 8.9 (H) 1.7 - 7.7 K/uL   Lymphocytes Relative 3 %   Lymphs Abs 0.3 (L) 0.7 - 4.0 K/uL   Monocytes Relative 4 %   Monocytes  Absolute 0.4 0.1 - 1.0 K/uL   Eosinophils Relative 0 %   Eosinophils Absolute 0.0 0.0 - 0.5 K/uL   Basophils Relative 0 %   Basophils Absolute 0.0 0.0 - 0.1 K/uL   Immature Granulocytes 1 %   Abs Immature Granulocytes 0.07 0.00 - 0.07 K/uL    Comment: Performed at [facility], [address] [place], [place] [address]  Magnesium     Status: None   Collection Time: 12/31/18  4:08 AM  Result Value Ref Range   Magnesium 2.0 1.7 - 2.4 mg/dL    Comment: Performed at [facility], [address], [place], [place] [address]  Glucose, capillary     Status: Abnormal   Collection Time: 12/31/18  7:09 AM  Result Value Ref Range   Glucose-Capillary 218 (H) 70 - 99 mg/dL    ABGS No results for input(s): PHART, PO2ART, TCO2, HCO3 in the last 72 hours.  Invalid input(s): PCO2 CULTURES Recent Results (from the past 240 hour(s))  Urine Culture     Status: Abnormal   Collection Time: 12/26/18 10:43 AM   Specimen: Urine, Catheterized  Result Value Ref Range Status   Specimen Description   Final    URINE, CATHETERIZED Performed at [facility], [address], [place], [place] [address]    Special Requests   Final    NONE Performed at [facility], [address], [place],  [address]    Culture (A)  Final    >=100,000 COLONIES/mL ESCHERICHIA COLI Confirmed Extended Spectrum Beta-Lactamase Producer (ESBL).  In bloodstream infections from ESBL organisms, carbapenems are preferred over piperacillin/tazobactam. They are shown to have a lower risk of mortality.    Report Status 12/29/2018 FINAL  Final   Organism ID, Bacteria ESCHERICHIA COLI (A)  Final      Susceptibility   Escherichia coli - MIC*    AMPICILLIN >=32 RESISTANT Resistant     CEFAZOLIN >=64 RESISTANT Resistant     CEFTRIAXONE >=64 RESISTANT Resistant     CIPROFLOXACIN >=4 RESISTANT Resistant     GENTAMICIN <=1 SENSITIVE Sensitive     IMIPENEM <=0.25 SENSITIVE Sensitive     NITROFURANTOIN 32 SENSITIVE Sensitive      TRIMETH/SULFA <=20 SENSITIVE Sensitive     AMPICILLIN/SULBACTAM >=32 RESISTANT Resistant     PIP/TAZO <=4 SENSITIVE Sensitive     Extended ESBL POSITIVE Resistant     * >=100,000 COLONIES/mL ESCHERICHIA COLI  SARS Coronavirus 2 by RT PCR (hospital order, performed in [place] hospital lab) Nasopharyngeal Nasopharyngeal Swab     Status: None   Collection Time: 12/26/18 10:46 AM   Specimen: Nasopharyngeal Swab  Result Value Ref Range Status   SARS Coronavirus 2 NEGATIVE NEGATIVE Final    Comment: (NOTE) If result is NEGATIVE SARS-CoV-2 target nucleic acids are NOT DETECTED. The SARS-CoV-2 RNA is generally detectable in upper and lower  respiratory specimens during the acute phase of infection. The lowest  concentration of SARS-CoV-2 viral copies this assay can detect is 250  copies / mL. A negative result does not preclude SARS-CoV-2 infection  and should not be used as the sole basis for treatment or other  patient management decisions.  A negative result may occur with  improper specimen collection / handling, submission of specimen other  than nasopharyngeal swab, presence of viral mutation(s) within the  areas targeted by this assay, and inadequate number of viral copies  (<250 copies / mL). A negative result must be combined with clinical  observations, patient history, and epidemiological information. If result is POSITIVE SARS-CoV-2 target nucleic acids are DETECTED. The SARS-CoV-2 RNA is generally detectable in upper and lower  respiratory specimens dur ing the acute phase of infection.  Positive  results are indicative of active infection with SARS-CoV-2.  Clinical  correlation with patient history and other diagnostic information is  necessary to determine patient infection status.  Positive results do  not rule out bacterial infection or co-infection with other viruses. If result is PRESUMPTIVE POSTIVE SARS-CoV-2 nucleic acids MAY BE PRESENT.   A presumptive positive  result was obtained on the submitted specimen  and confirmed on repeat testing.  While 2019 novel coronavirus  (SARS-CoV-2) nucleic acids may be present in the submitted sample  additional confirmatory testing may be necessary for epidemiological  and / or clinical management purposes  to differentiate between  SARS-CoV-2 and other Sarbecovirus currently known to infect humans.  If clinically indicated additional testing with an alternate test  methodology [phone]) is advised. The SARS-CoV-2 RNA is generally  detectable in upper and lower respiratory sp ecimens during the acute  phase of infection. The expected result is Negative. Fact Sheet for Patients:  StrictlyIdeas.no Fact Sheet for Healthcare Providers: BankingDealers.co.za This test is not yet approved or cleared by the [place] FDA and has been authorized for detection and/or diagnosis of SARS-CoV-2 by FDA under an Emergency Use Authorization (EUA).  This EUA will remain in effect (meaning this test can be used) for the duration of the COVID-19 declaration under Section 564(b)(1) of the Act, 21 U.S.C. section 360bbb-3(b)(1), unless the authorization is terminated or revoked sooner. Performed at [facility], [address], [place], [place] [address]   Blood Culture (routine x 2)     Status: None   Collection Time: 12/26/18 12:12 PM   Specimen: BLOOD LEFT HAND  Result Value Ref Range Status   Specimen Description BLOOD LEFT HAND  Final   Special Requests   Final    BOTTLES DRAWN AEROBIC AND ANAEROBIC Blood Culture results may not be optimal due to an inadequate volume of blood received in culture bottles   Culture   Final    NO GROWTH 5 DAYS Performed at [facility], [address] [place], [place] [address]    Report Status 12/31/2018 FINAL  Final  Blood Culture (routine x 2)     Status: None   Collection Time: 12/26/18 12:12 PM   Specimen: BLOOD LEFT HAND  Result  Value Ref Range Status   Specimen Description BLOOD LEFT HAND  Final   Special Requests   Final    BOTTLES DRAWN AEROBIC AND ANAEROBIC Blood Culture results may not be optimal due to an inadequate volume of blood received in culture bottles   Culture   Final    NO GROWTH 5 DAYS Performed at [facility], [address], [place], [place] [address]    Report Status 12/31/2018 FINAL  Final  MRSA  PCR Screening     Status: None   Collection Time: 12/26/18  8:18 PM   Specimen: Nasal Mucosa; Nasopharyngeal  Result Value Ref Range Status   MRSA by PCR NEGATIVE NEGATIVE Final    Comment:        The GeneXpert MRSA Assay (FDA approved for NASAL specimens only), is one component of a comprehensive MRSA colonization surveillance program. It is not intended to diagnose MRSA infection nor to guide or monitor treatment for MRSA infections. Performed at [facility], [address] [place], [place] [address]    Studies/Results: Dg Chest Port 1 View  Result Date: 12/29/2018 CLINICAL DATA:  Pulmonary edema. EXAM: PORTABLE CHEST 1 VIEW COMPARISON:  12/27/2018 FINDINGS: Persistent basilar chest densities could represent a combination of pleural effusions and atelectasis, left side greater than right. Heart size remains enlarged. Asymmetric prominent interstitial lung markings, right side greater than left, are similar to the previous examination. Negative for a pneumothorax. Trachea is midline. IMPRESSION: 1. Stable chest radiograph findings. Minimal change since 12/27/2018. 2. Persistent basilar chest densities could represent a combination of pleural effusions and atelectasis. 3. Asymmetric interstitial lung densities may represent mild edema. Electronically Signed   By: Markus Daft M.D.   On: 12/29/2018 11:48    Medications:  Prior to Admission:  Medications Prior to Admission  Medication Sig Dispense Refill Last Dose  . albuterol (PROVENTIL) (2.5 MG/3ML) 0.083% nebulizer solution Take 2.5 mg by  nebulization every 6 (six) hours as needed for wheezing or shortness of breath.   unknown  . amiodarone (PACERONE) 200 MG tablet Take 200 mg by mouth daily.   unknown  . diltiazem (CARDIZEM CD) 180 MG 24 hr capsule Take 1 capsule (180 mg total) by mouth daily. 30 capsule 1 unknown  . furosemide (LASIX) 40 MG tablet Take 1 tablet (40 mg total) by mouth daily as needed for fluid. (Patient taking differently: Take 60 mg by mouth daily. )   unknown  . metFORMIN (GLUCOPHAGE-XR) 500 MG 24 hr tablet Take 2 tablets (1,000 mg total) by mouth 2 (two) times daily with a meal. 120 tablet 1 unknown  . metoprolol tartrate (LOPRESSOR) 25 MG tablet Take 1 tablet (25 mg total) by mouth 2 (two) times daily. 60 tablet 0 unknonw  . nitrofurantoin (MACRODANTIN) 100 MG capsule Take 100 mg by mouth 2 (two) times daily.   unknown  . potassium chloride SA (KLOR-CON) 20 MEQ tablet Take 1 tablet (20 mEq total) by mouth daily as needed (take only when taking lasix). (Patient taking differently: Take 20 mEq by mouth 2 (two) times daily. )   unknown  . predniSONE (DELTASONE) 20 MG tablet Take 2 PO QAM x5 days (Patient taking differently: Take 40 mg by mouth daily with breakfast. Take 2 PO QAM x5 days) 10 tablet 0 unknown  . QUEtiapine (SEROQUEL) 25 MG tablet Take 1 tablet (25 mg total) by mouth daily at 6 PM. 30 tablet 3 unknown  . traMADol (ULTRAM) 50 MG tablet Take 50 mg by mouth 3 (three) times daily.   unknown  . acetaminophen (TYLENOL) 500 MG tablet Take 500 mg by mouth every 8 (eight) hours as needed for mild pain or headache.    unknown  . rivaroxaban (XARELTO) 20 MG TABS tablet Take 1 tablet (20 mg total) by mouth daily with supper. Stop Xarelto until Monday (08/26/18) in anticipation for kyphoplasty (Patient not taking: Reported on 12/26/2018) 21 tablet 0 Not Taking at Unknown time   Scheduled: . amiodarone  200  mg Oral Daily  . budesonide (PULMICORT) nebulizer solution  0.25 mg Nebulization BID  . Chlorhexidine  Gluconate Cloth  6 each Topical Daily  . diltiazem  180 mg Oral Daily  . furosemide  40 mg Intravenous Q12H  . insulin aspart  0-20 Units Subcutaneous Q4H  . insulin aspart  8 Units Subcutaneous TID WC  . insulin glargine  14 Units Subcutaneous Q24H  . ipratropium-albuterol  3 mL Nebulization Q6H  . methylPREDNISolone (SOLU-MEDROL) injection  60 mg Intravenous Q8H  . metoprolol tartrate  25 mg Oral BID  . pantoprazole (PROTONIX) IV  40 mg Intravenous Q24H  . potassium chloride  40 mEq Oral Once  . QUEtiapine  25 mg Oral q1800  . rivaroxaban  20 mg Oral Q supper   Continuous: . sodium chloride Stopped (12/27/18 2243)  . meropenem (MERREM) IV 1 g (12/31/18 0527)   [id number] **OR** acetaminophen, haloperidol lactate, ondansetron **OR** ondansetron (ZOFRAN) IV, senna-docusate  Assesment: She was admitted with acute on chronic hypoxic and hypercapnic respiratory failure.  She was markedly hypoxic at home.  She is required BiPAP.  She has a complicated situation with COPD and has some element of diastolic heart failure.  She apparently had a potentially malfunctioning oxygen concentrator at home but also had about 50 feet of oxygen tubing which may have contributed to her hypoxia  She has dementia at baseline which complicates her situation because she develops sundowning.  She has diabetes which is stable Active Problems:   COPD (chronic obstructive pulmonary disease) (HCC)   COPD with acute exacerbation (HCC)   Acute on chronic respiratory failure (HCC)   Diabetes mellitus ([place])   Chronic respiratory failure with hypoxia (HCC)   Leukocytosis   Breast cancer (HCC)   Osteoporosis   Vitamin D deficiency   Anemia   Bronchiectasis with acute exacerbation (HCC)   Dementia with behavioral disturbance (HCC)   Acute respiratory failure with hypoxia and hypercapnia (HCC)   On home O2   CO2 narcosis   Acute and chronic respiratory failure with hypercapnia (HCC)   Pulmonary edema    Elevated brain natriuretic peptide (BNP) level    Plan: Continue current treatments    LOS: 5 days   Alonza Bogus 12/31/2018, 7:56 AM

## 2018-12-31 NOTE — Progress Notes (Signed)
Patient taken off BIPAP and placed back on her 5L [place].

## 2019-01-01 DIAGNOSIS — Z7189 Other specified counseling: Secondary | ICD-10-CM

## 2019-01-01 DIAGNOSIS — J9602 Acute respiratory failure with hypercapnia: Secondary | ICD-10-CM

## 2019-01-01 DIAGNOSIS — J9601 Acute respiratory failure with hypoxia: Secondary | ICD-10-CM

## 2019-01-01 DIAGNOSIS — Z515 Encounter for palliative care: Secondary | ICD-10-CM

## 2019-01-01 LAB — GLUCOSE, CAPILLARY
Glucose-Capillary: 124 mg/dL — ABNORMAL HIGH (ref 70–99)
Glucose-Capillary: 173 mg/dL — ABNORMAL HIGH (ref 70–99)
Glucose-Capillary: 360 mg/dL — ABNORMAL HIGH (ref 70–99)
Glucose-Capillary: 264 mg/dL — ABNORMAL HIGH (ref 70–99)
Glucose-Capillary: 140 mg/dL — ABNORMAL HIGH (ref 70–99)

## 2019-01-01 MED ORDER — METHYLPREDNISOLONE SODIUM SUCC 40 MG IJ SOLR
40.0000 mg | Freq: Two times a day (BID) | INTRAMUSCULAR | Status: DC
  Administered 2019-01-01 – 2019-01-02 (×2): 40 mg via INTRAVENOUS
  Filled 2019-01-01 (×2): qty 1

## 2019-01-01 NOTE — Progress Notes (Signed)
Patient Demographics:    Eileen [name], is a 73 y.o. female, DOB - September 29, 1945, [id number]  Admit date - 12/26/2018   Admitting Physician Murlean Iba, MD  Outpatient Primary MD for the patient is Lavella Lemons, [place]  LOS - 6   Chief Complaint  Patient presents with   Weakness        Subjective:    Eileen [name] today has no fevers, no emesis,  No chest pain, states  shortness of breath is slightly better, did not use Bipap last night -  Assessment  & Plan :    Active Problems:   COPD (chronic obstructive pulmonary disease) (HCC)   COPD with acute exacerbation (HCC)   Acute on chronic respiratory failure (HCC)   Diabetes mellitus (HCC)   Chronic respiratory failure with hypoxia (HCC)   Leukocytosis   Breast cancer (HCC)   Osteoporosis   Vitamin D deficiency   Anemia   Bronchiectasis with acute exacerbation (HCC)   Dementia with behavioral disturbance (HCC)   Acute respiratory failure with hypoxia and hypercapnia (HCC)   On home O2   CO2 narcosis   Acute and chronic respiratory failure with hypercapnia (HCC)   Pulmonary edema   Elevated brain natriuretic peptide (BNP) level   Brief Summary:-  73 y.o.femalewith advanced end stage COPD, dementia,atrial flutter, HTN, DM2, right breast cancer, multiple recent hospitalizations for frailty, progressive lung disease and GI bleeding presented by EMS with weakness and encephalopathy readmitted on 12/26/2018 with acute on chronic hypoxic and hypercapnic respiratory failure   A/p    1)Acute on chronic respiratory failure with hypercapnia-slowly improving, was able to get by overnight without BiPAP unlike previous nights  -Consult from pulmonologist Dr. Luan Pulling appreciated - remains high risk for decompensation.   -Suspect underlying OSA, patient will need sleep study as outpatient (on 01/07/2019)  2)Right Basilar Pneumonia- ??   Aspiration related, okay to transition to p.o. Augmentin on 01/02/2019, complete meropenem mostly for ESBL UTI on 01/01/2019  3)HFpEF--last known EF 60 to 65%, continue IV Lasix, daily weights and fluid input and output monitoring  4) E. coli ESBL UTI--currently on meropenem to be completed on 01/01/2019 .   5)Dementia with behavioral disturbances- Haldol ordered as needed for symptoms.  6)DM2-A1c 7.5 reflecting fair diabetic control, basal insulin adjusted,, continue prandial coverage as well as sliding scale coverage  7)Chronic atrial flutter-continue Xarelto for stroke prophylaxis and metoprolol 25 mg twice daily, and Cardizem CD 180 mg daily  for rate control  8)Generalized weakness/deconditioning--physical therapist recommends SNF rehab, patient declines leaning to was discharged home with home health -PTA patient lived at home with daughter [name] and her grandson  9)Social/ethics--- currently full code, palliative care consult for goals of care and CODE STATUS appreciated,  DVT prophylaxis: Xarelto Code Status: Full Family Communication: phone call to daughter Disposition Plan: Continue stepdown ICU due to bipap needs  Consultants:  Pulmonology/palliative care services  Procedures:  BiPAP  Antimicrobials:  Meropenem 10/17 >> scheduled to end 10/21 -May need Augmentin upon discharge starting 01/02/2019  Disposition/Need for in-Hospital Stay- patient unable to be discharged at this time due to -IV meropenem for ESBL E. coli UTI, tenuous respiratory and cardiopulmonary status requiring IV Lasix  Code Status : Full code  Family Communication:   (patient is alert, awake and coherent) Discussed with Daughter [name] [phone])  Disposition Plan  : Pt is leaning towards...  Going [facility]   Consults  :  Dr Hawkins/Palliative  DVT Prophylaxis  : xarelto  Lab Results  Component Value Date   PLT 272 12/31/2018    Inpatient Medications  Scheduled Meds:   amiodarone  200 mg Oral Daily   budesonide (PULMICORT) nebulizer solution  0.25 mg Nebulization BID   Chlorhexidine Gluconate Cloth  6 each Topical Daily   diltiazem  180 mg Oral Daily   furosemide  40 mg Intravenous Q12H   insulin aspart  0-20 Units Subcutaneous Q4H   insulin aspart  8 Units Subcutaneous TID WC   insulin glargine  14 Units Subcutaneous Q24H   ipratropium-albuterol  3 mL Nebulization Q6H   methylPREDNISolone (SOLU-MEDROL) injection  60 mg Intravenous Q12H   metoprolol tartrate  25 mg Oral BID   pantoprazole  40 mg Oral Daily   QUEtiapine  25 mg Oral q1800   rivaroxaban  20 mg Oral Q supper   Continuous Infusions:  sodium chloride Stopped (12/27/18 2243)   meropenem (MERREM) IV 1 g (01/01/19 1412)   PRN Meds:.acetaminophen **OR** acetaminophen, haloperidol lactate, ondansetron **OR** ondansetron (ZOFRAN) IV, senna-docusate    Anti-infectives (From admission, onward)   Start     Dose/Rate Route Frequency Ordered Stop   12/27/18 2100  vancomycin (VANCOCIN) 500 mg in sodium chloride 0.9 % 100 mL IVPB  Status:  Discontinued     500 mg 100 mL/hr over 60 Minutes Intravenous Every 12 hours 12/27/18 0831 12/27/18 0856   12/27/18 0900  vancomycin (VANCOCIN) 1,500 mg in sodium chloride 0.9 % 500 mL IVPB  Status:  Discontinued     1,500 mg 250 mL/hr over 120 Minutes Intravenous  Once 12/27/18 0831 12/27/18 0856   12/27/18 0900  meropenem (MERREM) 1 g in sodium chloride 0.9 % 100 mL IVPB     1 g 200 mL/hr over 30 Minutes Intravenous Every 8 hours 12/27/18 0856 01/02/19 0559   12/27/18 0845  piperacillin-tazobactam (ZOSYN) IVPB 3.375 g  Status:  Discontinued     3.375 g 12.5 mL/hr over 240 Minutes Intravenous Every 8 hours 12/27/18 0831 12/27/18 0856   12/26/18 1145  cefTRIAXone (ROCEPHIN) 2 g in sodium chloride 0.9 % 100 mL IVPB  Status:  Discontinued     2 g 200 mL/hr over 30 Minutes Intravenous Every 24 hours 12/26/18 1142 12/27/18 0752   12/26/18 1145   azithromycin (ZITHROMAX) 500 mg in sodium chloride 0.9 % 250 mL IVPB  Status:  Discontinued     500 mg 250 mL/hr over 60 Minutes Intravenous Every 24 hours 12/26/18 1142 12/27/18 0752        Objective:   Vitals:   01/01/19 1300 01/01/19 1400 01/01/19 1414 01/01/19 1500  BP: (!) 116/58 119/63  (!) 113/59  Pulse: 76 78  81  Resp: 20 (!) 26  18  Temp:      TempSrc:      SpO2: 92% 91% 94% 91%  Weight:      Height:        Wt Readings from Last 3 Encounters:  01/01/19 66.1 kg  12/11/18 67.2 kg  11/22/18 70.8 kg     Intake/Output Summary (Last 24 hours) at 01/01/2019 1541 Last data filed at 01/01/2019 0500 Gross per 24 hour  Intake --  Output 1400 ml  Net -1400 ml     Physical  Exam  Gen:- Awake Alert, able to speak in sentences HEENT:- [place].AT, No sclera icterus Neck-Supple Neck,No JVD,.  Nose- [place] 2 L.min Lungs-diminished in bases, no wheezing at this time  CV- S1, S2 normal, irregular, Abd-  +ve B.Sounds, Abd Soft, No tenderness,    Extremity/Skin:- pedal pulses present  Psych-affect is appropriate, oriented x3 Neuro-no new focal deficits, no tremors   Data Review:   Micro Results Recent Results (from the past 240 hour(s))  Urine Culture     Status: Abnormal   Collection Time: 12/26/18 10:43 AM   Specimen: Urine, Catheterized  Result Value Ref Range Status   Specimen Description   Final    URINE, CATHETERIZED Performed at [facility], [address] [place], [place] [address]    Special Requests   Final    NONE Performed at [facility], [address] [place], [place] [address]    Culture (A)  Final    >=100,000 COLONIES/mL ESCHERICHIA COLI Confirmed Extended Spectrum Beta-Lactamase Producer (ESBL).  In bloodstream infections from ESBL organisms, carbapenems are preferred over piperacillin/tazobactam. They are shown to have a lower risk of mortality.    Report Status 12/29/2018 FINAL  Final   Organism ID, Bacteria ESCHERICHIA COLI (A)  Final       Susceptibility   Escherichia coli - MIC*    AMPICILLIN >=32 RESISTANT Resistant     CEFAZOLIN >=64 RESISTANT Resistant     CEFTRIAXONE >=64 RESISTANT Resistant     CIPROFLOXACIN >=4 RESISTANT Resistant     GENTAMICIN <=1 SENSITIVE Sensitive     IMIPENEM <=0.25 SENSITIVE Sensitive     NITROFURANTOIN 32 SENSITIVE Sensitive     TRIMETH/SULFA <=20 SENSITIVE Sensitive     AMPICILLIN/SULBACTAM >=32 RESISTANT Resistant     PIP/TAZO <=4 SENSITIVE Sensitive     Extended ESBL POSITIVE Resistant     * >=100,000 COLONIES/mL ESCHERICHIA COLI  SARS Coronavirus 2 by RT PCR (hospital order, performed in [facility] hospital lab) Nasopharyngeal Nasopharyngeal Swab     Status: None   Collection Time: 12/26/18 10:46 AM   Specimen: Nasopharyngeal Swab  Result Value Ref Range Status   SARS Coronavirus 2 NEGATIVE NEGATIVE Final    Comment: (NOTE) If result is NEGATIVE SARS-CoV-2 target nucleic acids are NOT DETECTED. The SARS-CoV-2 RNA is generally detectable in upper and lower  respiratory specimens during the acute phase of infection. The lowest  concentration of SARS-CoV-2 viral copies this assay can detect is 250  copies / mL. A negative result does not preclude SARS-CoV-2 infection  and should not be used as the sole basis for treatment or other  patient management decisions.  A negative result may occur with  improper specimen collection / handling, submission of specimen other  than nasopharyngeal swab, presence of viral mutation(s) within the  areas targeted by this assay, and inadequate number of viral copies  (<250 copies / mL). A negative result must be combined with clinical  observations, patient history, and epidemiological information. If result is POSITIVE SARS-CoV-2 target nucleic acids are DETECTED. The SARS-CoV-2 RNA is generally detectable in upper and lower  respiratory specimens dur ing the acute phase of infection.  Positive  results are indicative of active infection with  SARS-CoV-2.  Clinical  correlation with patient history and other diagnostic information is  necessary to determine patient infection status.  Positive results do  not rule out bacterial infection or co-infection with other viruses. If result is PRESUMPTIVE POSTIVE SARS-CoV-2 nucleic acids MAY BE PRESENT.   A  presumptive positive result was obtained on the submitted specimen  and confirmed on repeat testing.  While 2019 novel coronavirus  (SARS-CoV-2) nucleic acids may be present in the submitted sample  additional confirmatory testing may be necessary for epidemiological  and / or clinical management purposes  to differentiate between  SARS-CoV-2 and other Sarbecovirus currently known to infect humans.  If clinically indicated additional testing with an alternate test  methodology [phone]) is advised. The SARS-CoV-2 RNA is generally  detectable in upper and lower respiratory sp ecimens during the acute  phase of infection. The expected result is Negative. Fact Sheet for Patients:  StrictlyIdeas.no Fact Sheet for Healthcare Providers: BankingDealers.co.za This test is not yet approved or cleared by the [place] FDA and has been authorized for detection and/or diagnosis of SARS-CoV-2 by FDA under an Emergency Use Authorization (EUA).  This EUA will remain in effect (meaning this test can be used) for the duration of the COVID-19 declaration under Section 564(b)(1) of the Act, 21 U.S.C. section 360bbb-3(b)(1), unless the authorization is terminated or revoked sooner. Performed at [facility], [address] [place], [place] [address]   Blood Culture (routine x 2)     Status: None   Collection Time: 12/26/18 12:12 PM   Specimen: BLOOD LEFT HAND  Result Value Ref Range Status   Specimen Description BLOOD LEFT HAND  Final   Special Requests   Final    BOTTLES DRAWN AEROBIC AND ANAEROBIC Blood Culture results may not be optimal due to  an inadequate volume of blood received in culture bottles   Culture   Final    NO GROWTH 5 DAYS Performed at [facility], [address] [place] [address] [place], [place] [address]    Report Status 12/31/2018 FINAL  Final  Blood Culture (routine x 2)     Status: None   Collection Time: 12/26/18 12:12 PM   Specimen: BLOOD LEFT HAND  Result Value Ref Range Status   Specimen Description BLOOD LEFT HAND  Final   Special Requests   Final    BOTTLES DRAWN AEROBIC AND ANAEROBIC Blood Culture results may not be optimal due to an inadequate volume of blood received in culture bottles   Culture   Final    NO GROWTH 5 DAYS Performed at [facility], [address], [place], [place] [address]    Report Status 12/31/2018 FINAL  Final  MRSA PCR Screening     Status: None   Collection Time: 12/26/18  8:18 PM   Specimen: Nasal Mucosa; Nasopharyngeal  Result Value Ref Range Status   MRSA by PCR NEGATIVE NEGATIVE Final    Comment:        The GeneXpert MRSA Assay (FDA approved for NASAL specimens only), is one component of a comprehensive MRSA colonization surveillance program. It is not intended to diagnose MRSA infection nor to guide or monitor treatment for MRSA infections. Performed at [facility], [address], [place], [place] [address]     Radiology Reports Dg Chest 2 View  Result Date: 12/05/2018 CLINICAL DATA:  Bright red blood in the stool and rectal pain beginning today. History of hemorrhoids. EXAM: CHEST - 2 VIEW COMPARISON:  11/19/2018 FINDINGS: Cardiac silhouette is mildly enlarged. No mediastinal or hilar masses. No evidence of adenopathy. Chronic prominence of the bronchovascular markings. Additional linear lung base opacities consistent with scarring or atelectasis. No evidence of pneumonia or pulmonary edema. No pleural effusion or pneumothorax. Previously treated compression fracture at the thoracolumbar junction. No acute skeletal abnormality. IMPRESSION:  No acute cardiopulmonary disease.  Electronically Signed   By: Lajean Manes M.D.   On: 12/05/2018 18:02   Dg Ankle 2 Views Right  Result Date: 12/26/2018 CLINICAL DATA:  Right ankle pain. EXAM: RIGHT ANKLE - 2 VIEW COMPARISON:  No recent. FINDINGS: Diffuse soft tissue swelling. No acute bony abnormality identified. No evidence of fracture. Corticated bony densities noted adjacent to the mediolateral malleoli. These consistent old fracture fragments. IMPRESSION: 1.  Diffuse soft tissue swelling.  No acute bony abnormality. 2. Corticated bony densities noted adjacent to the medial and lateral malleoli consistent with old fracture fragments. Electronically Signed   By: Marcello Moores  Register   On: 12/26/2018 14:11   Ct Abdomen Pelvis W Contrast  Result Date: 12/05/2018 CLINICAL DATA:  73 year old female with a history of GI bleed EXAM: CT ABDOMEN AND PELVIS WITH CONTRAST TECHNIQUE: Multidetector CT imaging of the abdomen and pelvis was performed using the standard protocol following bolus administration of intravenous contrast. CONTRAST:  134mL OMNIPAQUE IOHEXOL 300 MG/ML  SOLN COMPARISON:  04/21/2018 FINDINGS: Lower chest: Scarring/atelectasis at the lung bases. Hepatobiliary: Unremarkable liver.  Unremarkable gallbladder. Pancreas: Unremarkable Spleen: Unremarkable Adrenals/Urinary Tract: Unremarkable appearance of the bilateral adrenal glands. Right kidney with no hydronephrosis or nephrolithiasis. Bosniak 1 cyst on the medial cortex of the right kidney on image 22. Additional subcentimeter low-density lesion on the anterior cortex of the right kidney on image 27 too small to characterize. Left kidney without hydronephrosis. Nonobstructive nephrolithiasis at the inferior collecting system, with stone measuring approximately 4 mm. Bosniak 1 cyst on the posterior cortex of the left kidney on image 26. Bosniak 1 cyst on the medial anterior cortex of the left kidney on image 28. There are additional low-density lesions which are too small to  characterize. Urinary bladder is relatively decompressed. Stomach/Bowel: Small hiatal hernia. Otherwise unremarkable stomach. Small bowel unremarkable. No abnormal distention. No focal wall thickening or inflammatory changes. No transition point. Normal appendix. Mild stool burden. Colonic diverticular disease throughout the colon. Retained contrast or inspissated secretions within numerous diverticula. No focal inflammatory changes are evident. No fluid within the bowel or contrast accumulation. Vascular/Lymphatic: Atherosclerotic changes of the abdominal aorta. No aneurysm or dissection. Mesenteric arteries and renal arteries are patent. Bilateral iliac arteries and proximal femoral arteries are patent. No adenopathy. Reproductive: Focal low-density fluid or tissue within the endometrial canal on image 58. Unremarkable left and right ovary. Other: Fat containing umbilical hernia with ventral diastasis. Small loop of small bowel at this site without entrapment. Musculoskeletal: Fracture of L1, treated with vertebral augmentation. Degree of posterior retropulsion similar to the comparison. Are no acute displaced fracture. IMPRESSION: No acute CT finding of the abdomen, with no CT evidence to identify source of GI hemorrhage. Colonic diverticula without evidence of acute inflammation. Hiatal hernia. Trace fluid or tissue within the endometrial canal, unexpected in a patient of this age. Referral for gynecologic follow-up is recommended, and potentially evaluation with ultrasound or MRI as early malignancy cannot be excluded. Aortic Atherosclerosis (ICD10-I70.0). Left-sided nonobstructive nephrolithiasis. Electronically Signed   By: Corrie Mckusick D.O.   On: 12/05/2018 18:05   Dg Chest Port 1 View  Result Date: 12/29/2018 CLINICAL DATA:  Pulmonary edema. EXAM: PORTABLE CHEST 1 VIEW COMPARISON:  12/27/2018 FINDINGS: Persistent basilar chest densities could represent a combination of pleural effusions and  atelectasis, left side greater than right. Heart size remains enlarged. Asymmetric prominent interstitial lung markings, right side greater than left, are similar to the previous examination. Negative for a pneumothorax. Trachea  is midline. IMPRESSION: 1. Stable chest radiograph findings. Minimal change since 12/27/2018. 2. Persistent basilar chest densities could represent a combination of pleural effusions and atelectasis. 3. Asymmetric interstitial lung densities may represent mild edema. Electronically Signed   By: Markus Daft M.D.   On: 12/29/2018 11:48   Dg Chest Port 1 View  Result Date: 12/27/2018 CLINICAL DATA:  Pulmonary edema EXAM: PORTABLE CHEST 1 VIEW COMPARISON:  Radiograph 12/26/2018 FINDINGS: Cardiomegaly with bilateral interstitial opacities asymmetrically increased in the right lung with bilateral pleural effusions. More confluent opacity is present in the right lung base. Overall appearance is not significantly changed from 1 day prior. Stable cardiomegaly. No pneumothorax. IMPRESSION: Appearance not significantly changed from 1 day prior with features of pulmonary edema and bilateral effusions. More confluent opacity in the right base possibly reflecting atelectasis or pneumonia. Stable cardiomegaly. Electronically Signed   By: Lovena Le M.D.   On: 12/27/2018 04:21   Dg Chest Port 1 View  Result Date: 12/26/2018 CLINICAL DATA:  Generalized weakness and worsening shortness of breath with exertion. Status post fall yesterday. EXAM: PORTABLE CHEST 1 VIEW COMPARISON:  Single-view of the chest 12/08/2018 and 11/19/2018. FINDINGS: There is cardiomegaly and pulmonary edema with small bilateral pleural effusions. Right basilar airspace disease is seen. No pneumothorax. No acute bony abnormality. IMPRESSION: Pulmonary edema with associated small effusions, larger on the right. Right basilar airspace disease could be atelectasis or pneumonia. Electronically Signed   By: Inge Rise M.D.    On: 12/26/2018 11:32   Dg Chest Port 1 View  Result Date: 12/08/2018 CLINICAL DATA:  Dyspnea EXAM: PORTABLE CHEST 1 VIEW COMPARISON:  Radiograph 12/05/2018, CT 09/27/2018 FINDINGS: Lung volumes are diminished with areas of basilar atelectasis. More hazy interstitial opacities present throughout the lower lobes with cephalized, indistinct vascularity and borderline cardiomegaly. More coalescent opacities seen in the right lung base. Region of left basilar scarring blunts the costophrenic sulcus. No visible effusions. No pneumothorax. No acute osseous or soft tissue abnormality. Degenerative changes are present in the imaged spine and shoulders. IMPRESSION: 1. Findings consistent with congestive heart failure. 2. More coalescent opacities in the right lung base could reflect superimposed alveolar edema or pneumonia. 3. Lung volumes are diminished with bibasilar atelectasis. Electronically Signed   By: Lovena Le M.D.   On: 12/08/2018 20:05     CBC Recent Labs  Lab 12/27/18 0438 12/28/18 0454 12/29/18 0452 12/30/18 0341 12/31/18 0408  WBC 10.4 15.6* 12.8* 11.2* 9.7  HGB 7.7* 7.5* 7.7* 8.0* 8.0*  HCT 28.8* 27.1* 28.3* 28.8* 28.7*  PLT 277 292 286 265 272  MCV 99.0 97.5 96.9 96.6 94.7  MCH 26.5 27.0 26.4 26.8 26.4  MCHC 26.7* 27.7* 27.2* 27.8* 27.9*  RDW 15.1 15.0 15.2 15.1 15.0  LYMPHSABS 0.7 0.7 0.5* 0.4* 0.3*  MONOABS 0.4 0.5 0.6 0.5 0.4  EOSABS 0.0 0.0 0.0 0.0 0.0  BASOSABS 0.0 0.0 0.0 0.0 0.0    Chemistries  Recent Labs  Lab 12/27/18 0438 12/28/18 0454 12/29/18 0452 12/30/18 0341 12/31/18 0408  NA 143 142 140 142 140  K 4.5 4.0 3.9 4.0 3.3*  CL 93* 89* 88* 86* 84*  CO2 39* 43* 42* 43* 44*  GLUCOSE 283* 115* 184* 233* 293*  BUN 10 17 22  24* 25*  CREATININE 0.59 0.55 0.55 0.62 0.50  CALCIUM 8.8* 8.6* 8.6* 8.7* 8.4*  MG 2.0 1.9 2.0  --  2.0  AST 12* 13* 13* 13* 9*  ALT 15 14 15 18 14   ALKPHOS  66 58 60 60 62  BILITOT 0.6 0.5 0.7 0.5 0.4    ------------------------------------------------------------------------------------------------------------------ No results for input(s): CHOL, HDL, LDLCALC, TRIG, CHOLHDL, LDLDIRECT in the last 72 hours.  Lab Results  Component Value Date   HGBA1C 7.5 (H) 12/11/2018   ------------------------------------------------------------------------------------------------------------------ No results for input(s): TSH, T4TOTAL, T3FREE, THYROIDAB in the last 72 hours.  Invalid input(s): FREET3 ------------------------------------------------------------------------------------------------------------------ No results for input(s): VITAMINB12, FOLATE, FERRITIN, TIBC, IRON, RETICCTPCT in the last 72 hours.  Coagulation profile Recent Labs  Lab 12/26/18 1212  INR 1.0    No results for input(s): DDIMER in the last 72 hours.  Cardiac Enzymes No results for input(s): CKMB, TROPONINI, MYOGLOBIN in the last 168 hours.  Invalid input(s): CK ------------------------------------------------------------------------------------------------------------------    Component Value Date/Time   BNP 582.0 (H) 12/29/2018 [id number]     Roxan Hockey M.D on 01/01/2019 at 3:41 PM  Go to [email or web address] - for contact info  [facility] - Office  [phone]

## 2019-01-01 NOTE — Care Management Important Message (Signed)
Important Message  Patient Details  Name: Eileen [name] MRN: [id number] Date of Birth: May 05, 1945   Medicare Important Message Given:  Yes     [name] 01/01/2019, 3:00 PM

## 2019-01-01 NOTE — Progress Notes (Signed)
Subjective: She says she feels about the same.  Feels like her breathing is getting better.  She did not use BiPAP last night.  Her oxygen is down to 3 to 4 L.  Objective: Vital signs in last 24 hours: Temp:  [97.8 F (36.6 C)-97.9 F (36.6 C)] 97.8 F (36.6 C) (10/21 0400) Pulse Rate:  [55-105] 57 (10/21 0600) Resp:  [15-29] 23 (10/21 0600) BP: (97-126)/(51-78) 114/64 (10/21 0600) SpO2:  [92 %-100 %] 97 % (10/21 0600) Weight:  [66.1 kg] 66.1 kg (10/21 0600) Weight change: -0.1 kg Last BM Date: 12/29/18(per patient)  Intake/Output from previous day: 10/20 0701 - 10/21 0700 In: -  Out: 1950 [Urine:1950]  PHYSICAL EXAM General appearance: alert, cooperative and no distress Resp: rhonchi Right more than left and No wheezing now Cardio: regular rate and rhythm, S1, S2 normal, no murmur, click, rub or gallop GI: soft, non-tender; bowel sounds normal; no masses,  no organomegaly No edema  Lab Results:  Results for orders placed or performed during the hospital encounter of 12/26/18 (from the past 48 hour(s))  Glucose, capillary     Status: Abnormal   Collection Time: 12/30/18  7:33 AM  Result Value Ref Range   Glucose-Capillary 187 (H) 70 - 99 mg/dL  Glucose, capillary     Status: Abnormal   Collection Time: 12/30/18 11:23 AM  Result Value Ref Range   Glucose-Capillary 314 (H) 70 - 99 mg/dL  Glucose, capillary     Status: None   Collection Time: 12/30/18  4:16 PM  Result Value Ref Range   Glucose-Capillary 93 70 - 99 mg/dL  Glucose, capillary     Status: Abnormal   Collection Time: 12/30/18  9:20 PM  Result Value Ref Range   Glucose-Capillary 287 (H) 70 - 99 mg/dL   Comment 1 Notify RN    Comment 2 Document in Chart   Glucose, capillary     Status: Abnormal   Collection Time: 12/31/18  2:58 AM  Result Value Ref Range   Glucose-Capillary 281 (H) 70 - 99 mg/dL   Comment 1 Notify RN    Comment 2 Document in Chart   Comprehensive metabolic panel     Status: Abnormal    Collection Time: 12/31/18  4:08 AM  Result Value Ref Range   Sodium 140 135 - 145 mmol/L   Potassium 3.3 (L) 3.5 - 5.1 mmol/L    Comment: DELTA CHECK NOTED   Chloride 84 (L) 98 - 111 mmol/L   CO2 44 (H) 22 - 32 mmol/L   Glucose, Bld 293 (H) 70 - 99 mg/dL   BUN 25 (H) 8 - 23 mg/dL   Creatinine, Ser 0.50 0.44 - 1.00 mg/dL   Calcium 8.4 (L) 8.9 - 10.3 mg/dL   Total Protein 5.5 (L) 6.5 - 8.1 g/dL   Albumin 3.0 (L) 3.5 - 5.0 g/dL   AST 9 (L) 15 - 41 U/L   ALT 14 0 - 44 U/L   Alkaline Phosphatase 62 38 - 126 U/L   Total Bilirubin 0.4 0.3 - 1.2 mg/dL   GFR calc non Af Amer >60 >60 mL/min   GFR calc Af Amer >60 >60 mL/min   Anion gap 12 5 - 15    Comment: Performed at [facility], [address] [place], [place] [address]  CBC WITH DIFFERENTIAL     Status: Abnormal   Collection Time: 12/31/18  4:08 AM  Result Value Ref Range   WBC 9.7 4.0 - 10.5 K/uL  RBC 3.03 (L) 3.87 - 5.11 MIL/uL   Hemoglobin 8.0 (L) 12.0 - 15.0 g/dL   HCT 28.7 (L) 36.0 - 46.0 %   MCV 94.7 80.0 - 100.0 fL   MCH 26.4 26.0 - 34.0 pg   MCHC 27.9 (L) 30.0 - 36.0 g/dL   RDW 15.0 11.5 - 15.5 %   Platelets 272 150 - 400 K/uL   nRBC 0.0 0.0 - 0.2 %   Neutrophils Relative % 92 %   Neutro Abs 8.9 (H) 1.7 - 7.7 K/uL   Lymphocytes Relative 3 %   Lymphs Abs 0.3 (L) 0.7 - 4.0 K/uL   Monocytes Relative 4 %   Monocytes Absolute 0.4 0.1 - 1.0 K/uL   Eosinophils Relative 0 %   Eosinophils Absolute 0.0 0.0 - 0.5 K/uL   Basophils Relative 0 %   Basophils Absolute 0.0 0.0 - 0.1 K/uL   Immature Granulocytes 1 %   Abs Immature Granulocytes 0.07 0.00 - 0.07 K/uL    Comment: Performed at [facility], [address] [place], [place] [address]  Magnesium     Status: None   Collection Time: 12/31/18  4:08 AM  Result Value Ref Range   Magnesium 2.0 1.7 - 2.4 mg/dL    Comment: Performed at [facility], [address], [place], [place] [address]  Glucose, capillary     Status: Abnormal   Collection Time: 12/31/18  7:09 AM   Result Value Ref Range   Glucose-Capillary 218 (H) 70 - 99 mg/dL  Glucose, capillary     Status: Abnormal   Collection Time: 12/31/18 11:19 AM  Result Value Ref Range   Glucose-Capillary 339 (H) 70 - 99 mg/dL  Glucose, capillary     Status: Abnormal   Collection Time: 12/31/18  4:01 PM  Result Value Ref Range   Glucose-Capillary 152 (H) 70 - 99 mg/dL  Glucose, capillary     Status: Abnormal   Collection Time: 12/31/18  9:02 PM  Result Value Ref Range   Glucose-Capillary 141 (H) 70 - 99 mg/dL   Comment 1 Notify RN    Comment 2 Document in Chart   Glucose, capillary     Status: Abnormal   Collection Time: 01/01/19  2:48 AM  Result Value Ref Range   Glucose-Capillary 124 (H) 70 - 99 mg/dL   Comment 1 Notify RN    Comment 2 Document in Chart     ABGS No results for input(s): PHART, PO2ART, TCO2, HCO3 in the last 72 hours.  Invalid input(s): PCO2 CULTURES Recent Results (from the past 240 hour(s))  Urine Culture     Status: Abnormal   Collection Time: 12/26/18 10:43 AM   Specimen: Urine, Catheterized  Result Value Ref Range Status   Specimen Description   Final    URINE, CATHETERIZED Performed at [facility], [address], [place], [place] [address]    Special Requests   Final    NONE Performed at [facility], [address] [place], [place] [address]    Culture (A)  Final    >=100,000 COLONIES/mL ESCHERICHIA COLI Confirmed Extended Spectrum Beta-Lactamase Producer (ESBL).  In bloodstream infections from ESBL organisms, carbapenems are preferred over piperacillin/tazobactam. They are shown to have a lower risk of mortality.    Report Status 12/29/2018 FINAL  Final   Organism ID, Bacteria ESCHERICHIA COLI (A)  Final      Susceptibility   Escherichia coli - MIC*    AMPICILLIN >=32 RESISTANT Resistant     CEFAZOLIN >=64  RESISTANT Resistant     CEFTRIAXONE >=64 RESISTANT Resistant     CIPROFLOXACIN >=4 RESISTANT Resistant     GENTAMICIN <=1 SENSITIVE Sensitive      IMIPENEM <=0.25 SENSITIVE Sensitive     NITROFURANTOIN 32 SENSITIVE Sensitive     TRIMETH/SULFA <=20 SENSITIVE Sensitive     AMPICILLIN/SULBACTAM >=32 RESISTANT Resistant     PIP/TAZO <=4 SENSITIVE Sensitive     Extended ESBL POSITIVE Resistant     * >=100,000 COLONIES/mL ESCHERICHIA COLI  SARS Coronavirus 2 by RT PCR (hospital order, performed in [place] hospital lab) Nasopharyngeal Nasopharyngeal Swab     Status: None   Collection Time: 12/26/18 10:46 AM   Specimen: Nasopharyngeal Swab  Result Value Ref Range Status   SARS Coronavirus 2 NEGATIVE NEGATIVE Final    Comment: (NOTE) If result is NEGATIVE SARS-CoV-2 target nucleic acids are NOT DETECTED. The SARS-CoV-2 RNA is generally detectable in upper and lower  respiratory specimens during the acute phase of infection. The lowest  concentration of SARS-CoV-2 viral copies this assay can detect is 250  copies / mL. A negative result does not preclude SARS-CoV-2 infection  and should not be used as the sole basis for treatment or other  patient management decisions.  A negative result may occur with  improper specimen collection / handling, submission of specimen other  than nasopharyngeal swab, presence of viral mutation(s) within the  areas targeted by this assay, and inadequate number of viral copies  (<250 copies / mL). A negative result must be combined with clinical  observations, patient history, and epidemiological information. If result is POSITIVE SARS-CoV-2 target nucleic acids are DETECTED. The SARS-CoV-2 RNA is generally detectable in upper and lower  respiratory specimens dur ing the acute phase of infection.  Positive  results are indicative of active infection with SARS-CoV-2.  Clinical  correlation with patient history and other diagnostic information is  necessary to determine patient infection status.  Positive results do  not rule out bacterial infection or co-infection with other viruses. If result is  PRESUMPTIVE POSTIVE SARS-CoV-2 nucleic acids MAY BE PRESENT.   A presumptive positive result was obtained on the submitted specimen  and confirmed on repeat testing.  While 2019 novel coronavirus  (SARS-CoV-2) nucleic acids may be present in the submitted sample  additional confirmatory testing may be necessary for epidemiological  and / or clinical management purposes  to differentiate between  SARS-CoV-2 and other Sarbecovirus currently known to infect humans.  If clinically indicated additional testing with an alternate test  methodology [phone]) is advised. The SARS-CoV-2 RNA is generally  detectable in upper and lower respiratory sp ecimens during the acute  phase of infection. The expected result is Negative. Fact Sheet for Patients:  StrictlyIdeas.no Fact Sheet for Healthcare Providers: BankingDealers.co.za This test is not yet approved or cleared by the [place] FDA and has been authorized for detection and/or diagnosis of SARS-CoV-2 by FDA under an Emergency Use Authorization (EUA).  This EUA will remain in effect (meaning this test can be used) for the duration of the COVID-19 declaration under Section 564(b)(1) of the Act, 21 U.S.C. section 360bbb-3(b)(1), unless the authorization is terminated or revoked sooner. Performed at [facility], [address], [place], [place] [address]   Blood Culture (routine x 2)     Status: None   Collection Time: 12/26/18 12:12 PM   Specimen: BLOOD LEFT HAND  Result Value Ref Range Status   Specimen Description BLOOD LEFT HAND  Final   Special  Requests   Final    BOTTLES DRAWN AEROBIC AND ANAEROBIC Blood Culture results may not be optimal due to an inadequate volume of blood received in culture bottles   Culture   Final    NO GROWTH 5 DAYS Performed at [facility], [address] [place], [place] [address]    Report Status 12/31/2018 FINAL  Final  Blood Culture (routine x 2)      Status: None   Collection Time: 12/26/18 12:12 PM   Specimen: BLOOD LEFT HAND  Result Value Ref Range Status   Specimen Description BLOOD LEFT HAND  Final   Special Requests   Final    BOTTLES DRAWN AEROBIC AND ANAEROBIC Blood Culture results may not be optimal due to an inadequate volume of blood received in culture bottles   Culture   Final    NO GROWTH 5 DAYS Performed at [facility], [address] [place], [place] [address]    Report Status 12/31/2018 FINAL  Final  MRSA PCR Screening     Status: None   Collection Time: 12/26/18  8:18 PM   Specimen: Nasal Mucosa; Nasopharyngeal  Result Value Ref Range Status   MRSA by PCR NEGATIVE NEGATIVE Final    Comment:        The GeneXpert MRSA Assay (FDA approved for NASAL specimens only), is one component of a comprehensive MRSA colonization surveillance program. It is not intended to diagnose MRSA infection nor to guide or monitor treatment for MRSA infections. Performed at [facility], [address], [place], [place] [address]    Studies/Results: No results found.  Medications:  Prior to Admission:  Medications Prior to Admission  Medication Sig Dispense Refill Last Dose  . albuterol (PROVENTIL) (2.5 MG/3ML) 0.083% nebulizer solution Take 2.5 mg by nebulization every 6 (six) hours as needed for wheezing or shortness of breath.   unknown  . amiodarone (PACERONE) 200 MG tablet Take 200 mg by mouth daily.   unknown  . diltiazem (CARDIZEM CD) 180 MG 24 hr capsule Take 1 capsule (180 mg total) by mouth daily. 30 capsule 1 unknown  . furosemide (LASIX) 40 MG tablet Take 1 tablet (40 mg total) by mouth daily as needed for fluid. (Patient taking differently: Take 60 mg by mouth daily. )   unknown  . metFORMIN (GLUCOPHAGE-XR) 500 MG 24 hr tablet Take 2 tablets (1,000 mg total) by mouth 2 (two) times daily with a meal. 120 tablet 1 unknown  . metoprolol tartrate (LOPRESSOR) 25 MG tablet Take 1 tablet (25 mg total) by mouth 2 (two) times  daily. 60 tablet 0 unknonw  . nitrofurantoin (MACRODANTIN) 100 MG capsule Take 100 mg by mouth 2 (two) times daily.   unknown  . potassium chloride SA (KLOR-CON) 20 MEQ tablet Take 1 tablet (20 mEq total) by mouth daily as needed (take only when taking lasix). (Patient taking differently: Take 20 mEq by mouth 2 (two) times daily. )   unknown  . predniSONE (DELTASONE) 20 MG tablet Take 2 PO QAM x5 days (Patient taking differently: Take 40 mg by mouth daily with breakfast. Take 2 PO QAM x5 days) 10 tablet 0 unknown  . QUEtiapine (SEROQUEL) 25 MG tablet Take 1 tablet (25 mg total) by mouth daily at 6 PM. 30 tablet 3 unknown  . traMADol (ULTRAM) 50 MG tablet Take 50 mg by mouth 3 (three) times daily.   unknown  . acetaminophen (TYLENOL) 500 MG tablet Take 500 mg by mouth every 8 (eight) hours as needed  for mild pain or headache.    unknown  . rivaroxaban (XARELTO) 20 MG TABS tablet Take 1 tablet (20 mg total) by mouth daily with supper. Stop Xarelto until Monday (08/26/18) in anticipation for kyphoplasty (Patient not taking: Reported on 12/26/2018) 21 tablet 0 Not Taking at Unknown time   Scheduled: . amiodarone  200 mg Oral Daily  . budesonide (PULMICORT) nebulizer solution  0.25 mg Nebulization BID  . Chlorhexidine Gluconate Cloth  6 each Topical Daily  . diltiazem  180 mg Oral Daily  . furosemide  40 mg Intravenous Q12H  . insulin aspart  0-20 Units Subcutaneous Q4H  . insulin aspart  8 Units Subcutaneous TID WC  . insulin glargine  14 Units Subcutaneous Q24H  . ipratropium-albuterol  3 mL Nebulization Q6H  . methylPREDNISolone (SOLU-MEDROL) injection  60 mg Intravenous Q12H  . metoprolol tartrate  25 mg Oral BID  . pantoprazole  40 mg Oral Daily  . QUEtiapine  25 mg Oral q1800  . rivaroxaban  20 mg Oral Q supper   Continuous: . sodium chloride Stopped (12/27/18 2243)  . meropenem (MERREM) IV 1 g (01/01/19 0533)   [id number] **OR** acetaminophen, haloperidol lactate, ondansetron  **OR** ondansetron (ZOFRAN) IV, senna-docusate  Assesment: She was admitted with acute on chronic hypoxic and hypercapnic respiratory failure.  She is been requiring BiPAP but did not use it last night.  She has COPD at baseline and has an acute exacerbation.  She has right lower lobe pneumonia presumably from aspiration.  She is on Merrem for that plus she had an ESBL E. coli in her urine  She has dementia which complicates her situation  She had what appeared to be acute on chronic diastolic heart failure and she is on IV Lasix Active Problems:   COPD (chronic obstructive pulmonary disease) (HCC)   COPD with acute exacerbation (HCC)   Acute on chronic respiratory failure (HCC)   Diabetes mellitus ([place])   Chronic respiratory failure with hypoxia (HCC)   Leukocytosis   Breast cancer ([place])   Osteoporosis   Vitamin D deficiency   Anemia   Bronchiectasis with acute exacerbation (HCC)   Dementia with behavioral disturbance (HCC)   Acute respiratory failure with hypoxia and hypercapnia ([place])   On home O2   CO2 narcosis   Acute and chronic respiratory failure with hypercapnia (HCC)   Pulmonary edema   Elevated brain natriuretic peptide (BNP) level    Plan: Continue treatments.      LOS: 6 days   Eileen [name] 01/01/2019, 7:26 AM

## 2019-01-01 NOTE — Consult Note (Signed)
Consultation Note Date: 01/01/2019   Patient Name: Eileen [name]  DOB: [birth year]  MRN: [id number]  Age / Sex: 73 y.o., female  PCP: Lavella Lemons, [place] Referring Physician: Roxan Hockey, MD  Reason for Consultation: Establishing goals of care  HPI/Patient Profile: 73 y.o. female  with past medical history of AECOPD with frequent admissions for respiratory failure that recover with Bipap, moderate dementia- able to eat and ambulate, HTN, atrial flutter, DM2, remote history of breast cancer, compressions fracture, GI bleeding admitted on 12/26/2018 with increasing SOB and mental status changes. She was found to be obtunded with O2 in the 70-80's, per EMS unsure if O2 concentrator was working in the home. Chest xray with findings of pneumonia and pulmonary edema. She is improving, currently using bipap prn- did not need last night. Palliative medicine consulted for [place].   Clinical Assessment and Goals of Care: PMT has met with this patient and discussed care with patient's daughter, [name] several times in the past. In previous consults patient's family has indicated that their [place] are to stabilize patient- treat treatable, and return patient home to their care. They would "never, ever" discharge patient to a SNF.  They have been avoidant in discussing advanced care planning- usually stating they would talk about it with siblings, but not returning providers calls.  Today I met with the patient at bedside. She was sitting up in chair. She tells me she was in the hospital because, "they tell me I was out of it".  According to Eileen- she is able to ambulate at home, but does get short of breath- this is consistent with PT eval.  We discussed advanced directives. Eileen states that she doesn't believe she would want CPR or to be intubated or kept alive on a ventilator- she says that if it was time to go, I guess  I'd just be ready to go on. However, she says she needs to discuss these things with her family. She stated she wished that her daughters were here to discuss it with her.  I gave her a copy of the Advanced Directives packet- which includes HCPOA. She has several children- she notes that she would only want Sharyn Lull and [name] to be her decision maker- and she would like to put this into writing after discussing it with them.   Primary Decision Maker PATIENT    SUMMARY OF RECOMMENDATIONS -Continue current plan of care, d/c home- I do not believe that any GOC will have changed since last consult. -Recommend referral for outpatient Palliative to follow at home for continued [place] - I attempted to call patient's daughter, [name]- there was no answer    Code Status/Advance Care Planning:  Full code  Prognosis:    Unable to determine  Discharge Planning: Home with Home Health  Primary Diagnoses: Present on Admission: . Acute on chronic respiratory failure ([place]) . COPD with acute exacerbation ([place]) . Chronic respiratory failure with hypoxia ([place]) . Dementia with behavioral disturbance ([place]) . COPD (chronic obstructive pulmonary disease) ([place]) .  Leukocytosis . Bronchiectasis with acute exacerbation ([place]) . CO2 narcosis . Breast cancer ([place]) . Osteoporosis . Vitamin D deficiency . Anemia . Acute respiratory failure with hypoxia and hypercapnia (HCC) . Acute and chronic respiratory failure with hypercapnia ([place]) . Pulmonary edema . Elevated brain natriuretic peptide (BNP) level   I have reviewed the medical record, interviewed the patient and family, and examined the patient. The following aspects are pertinent.  Past Medical History:  Diagnosis Date  . Asthma   . Atrial flutter ([place])   . Cancer [facility])    Right breast  . COPD (chronic obstructive pulmonary disease) ([place])   . Dementia ([place])   . Diverticulosis   . Hemorrhoids   . History of breast cancer    right breast  .  Hypertension   . On home O2   . Type 2 diabetes mellitus ([place])    Social History   Socioeconomic History  . Marital status: Single    Spouse name: Not on file  . Number of children: Not on file  . Years of education: Not on file  . Highest education level: Not on file  Occupational History  . Not on file  Social Needs  . Financial resource strain: Not hard at all  . Food insecurity    Worry: Never true    Inability: Never true  . Transportation needs    Medical: Patient refused    Non-medical: Patient refused  Tobacco Use  . Smoking status: Former Smoker    Packs/day: 0.50    Years: 32.00    Pack years: 16.00    Types: Cigarettes    Start date: 12/12/1962    Quit date: 03/13/1994    Years since quitting: 24.8  . Smokeless tobacco: Never Used  Substance and Sexual Activity  . Alcohol use: No    Alcohol/week: 0.0 standard drinks  . Drug use: No  . Sexual activity: Not on file  Lifestyle  . Physical activity    Days per week: Patient refused    Minutes per session: Patient refused  . Stress: Not at all  Relationships  . Social Herbalist on phone: Three times a week    Gets together: Three times a week    Attends religious service: More than 4 times per year    Active member of club or organization: Yes    Attends meetings of clubs or organizations: More than 4 times per year    Relationship status: Never married  Other Topics Concern  . Not on file  Social History Narrative  . Not on file   Family History  Problem Relation Age of Onset  . Cancer Mother        lung  . Diabetes Brother    Scheduled Meds: . amiodarone  200 mg Oral Daily  . budesonide (PULMICORT) nebulizer solution  0.25 mg Nebulization BID  . Chlorhexidine Gluconate Cloth  6 each Topical Daily  . diltiazem  180 mg Oral Daily  . furosemide  40 mg Intravenous Q12H  . insulin aspart  0-20 Units Subcutaneous Q4H  . insulin aspart  8 Units Subcutaneous TID WC  . insulin glargine  14  Units Subcutaneous Q24H  . ipratropium-albuterol  3 mL Nebulization Q6H  . methylPREDNISolone (SOLU-MEDROL) injection  60 mg Intravenous Q12H  . metoprolol tartrate  25 mg Oral BID  . pantoprazole  40 mg Oral Daily  . QUEtiapine  25 mg Oral q1800  . rivaroxaban  20 mg Oral  Q supper   Continuous Infusions: . sodium chloride Stopped (12/27/18 2243)  . meropenem (MERREM) IV 1 g (01/01/19 0533)   PRN Meds:.acetaminophen **OR** acetaminophen, haloperidol lactate, ondansetron **OR** ondansetron (ZOFRAN) IV, senna-docusate Medications Prior to Admission:  Prior to Admission medications   Medication Sig Start Date End Date Taking? Authorizing Provider  albuterol (PROVENTIL) (2.5 MG/3ML) 0.083% nebulizer solution Take 2.5 mg by nebulization every 6 (six) hours as needed for wheezing or shortness of breath.   Yes [provider]  amiodarone (PACERONE) 200 MG tablet Take 200 mg by mouth daily.   Yes [provider]  diltiazem (CARDIZEM CD) 180 MG 24 hr capsule Take 1 capsule (180 mg total) by mouth daily. 12/12/18 01/11/19 Yes Johnson, Clanford L, MD  furosemide (LASIX) 40 MG tablet Take 1 tablet (40 mg total) by mouth daily as needed for fluid. Patient taking differently: Take 60 mg by mouth daily.  12/11/18  Yes Johnson, Clanford L, MD  metFORMIN (GLUCOPHAGE-XR) 500 MG 24 hr tablet Take 2 tablets (1,000 mg total) by mouth 2 (two) times daily with a meal. 12/11/18 01/10/19 Yes Johnson, Clanford L, MD  metoprolol tartrate (LOPRESSOR) 25 MG tablet Take 1 tablet (25 mg total) by mouth 2 (two) times daily. 12/11/18 01/10/19 Yes Johnson, Clanford L, MD  nitrofurantoin (MACRODANTIN) 100 MG capsule Take 100 mg by mouth 2 (two) times daily.   Yes [provider]  potassium chloride SA (KLOR-CON) 20 MEQ tablet Take 1 tablet (20 mEq total) by mouth daily as needed (take only when taking lasix). Patient taking differently: Take 20 mEq by mouth 2 (two) times daily.  12/11/18  Yes Johnson,  Clanford L, MD  predniSONE (DELTASONE) 20 MG tablet Take 2 PO QAM x5 days Patient taking differently: Take 40 mg by mouth daily with breakfast. Take 2 PO QAM x5 days 12/12/18  Yes Johnson, Clanford L, MD  QUEtiapine (SEROQUEL) 25 MG tablet Take 1 tablet (25 mg total) by mouth daily at 6 PM. 11/11/18 12/26/18 Yes Shah, Pratik D, DO  traMADol (ULTRAM) 50 MG tablet Take 50 mg by mouth 3 (three) times daily.   Yes [provider]  acetaminophen (TYLENOL) 500 MG tablet Take 500 mg by mouth every 8 (eight) hours as needed for mild pain or headache.     Lavella Lemons, PA  rivaroxaban (XARELTO) 20 MG TABS tablet Take 1 tablet (20 mg total) by mouth daily with supper. Stop Xarelto until Monday (08/26/18) in anticipation for kyphoplasty Patient not taking: Reported on 12/26/2018 12/12/18   Murlean Iba, MD   Allergies  Allergen Reactions  . Codeine Other (See Comments)    "jittery"   Review of Systems  Physical Exam  Vital Signs: BP (!) 113/53   Pulse 86   Temp 97.9 F (36.6 C) (Axillary)   Resp 17   Ht _0  (1.575 m)   Wt 66.1 kg   SpO2 95%   BMI 26.65 kg/m  Pain Scale: 0-10 POSS *See Group Information*: 1-Acceptable,Awake and alert Pain Score: 0-No pain   SpO2: SpO2: 95 % O2 Device:SpO2: 95 % O2 Flow Rate: .O2 Flow Rate (L/min): 3.5 L/min  IO: Intake/output summary:   Intake/Output Summary (Last 24 hours) at 01/01/2019 1037 Last data filed at 01/01/2019 0500 Gross per 24 hour  Intake -  Output 1400 ml  Net -1400 ml    LBM: Last BM Date: 12/29/18(per patient) Baseline Weight: Weight: 67 kg Most recent weight: Weight: 66.1 kg     Palliative  Assessment/Data: PPS: 50%   Flowsheet Rows     Most Recent Value  Intake Tab  Referral Department  Hospitalist  Unit at Time of Referral  ER  Date Notified  12/26/18  Palliative Care Type  Return patient Palliative Care  Reason for referral  Clarify Goals of Care  Date of Admission  12/26/18  # of days IP prior to  Palliative referral  0  Clinical Assessment  Psychosocial & Spiritual Assessment  Palliative Care Outcomes      Thank you for this consult. Palliative medicine will continue to follow and assist as needed.   Time In: 0930 Time Out: 1040 Time Total: 70 minutes Greater than 50%  of this time was spent counseling and coordinating care related to the above assessment and plan.  Signed by: Mariana Kaufman, AGNP-C Palliative Medicine    Please contact Palliative Medicine Team phone at [phone] for questions and concerns.  For individual provider: See Shea Evans

## 2019-01-01 NOTE — TOC Progression Note (Addendum)
Transition of Care [facility]) - Progression Note    Patient Details  Name: Eileen [name] MRN: [id number] Date of Birth: 03/03/1946  [facility] CM/SW Contact  Shade Flood, LCSW Phone Number: 01/01/2019, 12:24 PM  Clinical Narrative:     TOC following. Spoke with staff at the [facility] regarding re-scheduling pt's sleep study. Was informed that they are not going to be able to do that and that pt should present at the previously scheduled appointment time.  Updated MD and pt's daughter, [name]. Per [name], she is going to call and reschedule the appointment because she doesn't want pt to have to come back so quickly after being discharged "because of the COVID."  [place] recommending outpatient Palliative referral. Referral sent to [place] at [facility]. [name] also mentioned that the [facility] SW wanted to speak with the hospital SW. Contacted Corey at [facility] to ask for assistance reaching their LCSW. Awaiting return call.  TOC will follow.  Expected Discharge Plan: [place] Barriers to Discharge: Continued Medical Work up  Expected Discharge Plan and Services Expected Discharge Plan: [place]   Discharge Planning Services: CM Consult Post Acute Care Choice: [place], Resumption of Svcs/PTA Provider                               [facility] Agency: [place] Date [place]: 12/27/18 Time [place]: [id number] Representative spoke with at [place]: Georgina Snell with Alvis Lemmings   Social Determinants of Health ([place]) Interventions    Readmission Risk Interventions Readmission Risk Prevention Plan 12/11/2018 12/06/2018 11/20/2018  Transportation Screening - Complete -  PCP or Specialist Appt within 5-7 Days - - -  Home Care Screening - - -  Medication Review (RN CM) - - -  Medication Review (RN Transport planner) - Complete -  PCP or Specialist appointment within 3-5 days of discharge -  Not Complete -  [place] or [place] - Complete -  SW Recovery Care/Counseling Consult - Complete -  Palliative Care Screening - Not Complete Complete  Comments - - -  [place] Not Applicable Not Complete -  Some recent data might be hidden

## 2019-01-02 LAB — BASIC METABOLIC PANEL
Anion gap: 12 (ref 5–15)
BUN: 33 mg/dL — ABNORMAL HIGH (ref 8–23)
CO2: 46 mmol/L — ABNORMAL HIGH (ref 22–32)
Calcium: 9.2 mg/dL (ref 8.9–10.3)
Chloride: 85 mmol/L — ABNORMAL LOW (ref 98–111)
Creatinine, Ser: 0.53 mg/dL (ref 0.44–1.00)
GFR calc Af Amer: >60 mL/min (ref >60)
GFR calc non Af Amer: >60 mL/min (ref >60)
Glucose, Bld: 171 mg/dL — ABNORMAL HIGH (ref 70–99)
Potassium: 3.4 mmol/L — ABNORMAL LOW (ref 3.5–5.1)
Sodium: 143 mmol/L (ref 135–145)

## 2019-01-02 LAB — GLUCOSE, CAPILLARY
Glucose-Capillary: 179 mg/dL — ABNORMAL HIGH (ref 70–99)
Glucose-Capillary: 167 mg/dL — ABNORMAL HIGH (ref 70–99)
Glucose-Capillary: 291 mg/dL — ABNORMAL HIGH (ref 70–99)

## 2019-01-02 MED ORDER — INSULIN GLARGINE 100 UNIT/ML ~~LOC~~ SOLN: 14.0000 [IU] | Freq: Every day | SUBCUTANEOUS | 2 refills | Status: DC

## 2019-01-02 MED ORDER — FUROSEMIDE 40 MG PO TABS: 60.0000 mg | ORAL_TABLET | Freq: Every day | ORAL | 5 refills | Status: DC

## 2019-01-02 MED ORDER — METFORMIN HCL ER (MOD) 1000 MG PO TB24: 1000.0000 mg | ORAL_TABLET | Freq: Two times a day (BID) | ORAL | 2 refills | Status: DC

## 2019-01-02 MED ORDER — DILTIAZEM HCL ER COATED BEADS 180 MG PO CP24: 180.0000 mg | ORAL_CAPSULE | Freq: Every day | ORAL | 3 refills | Status: DC

## 2019-01-02 MED ORDER — POTASSIUM CHLORIDE CRYS ER 20 MEQ PO TBCR
40.0000 meq | EXTENDED_RELEASE_TABLET | Freq: Once | ORAL | Status: AC
  Administered 2019-01-02: 40 meq via ORAL
  Filled 2019-01-02: qty 2

## 2019-01-02 MED ORDER — INSULIN LISPRO (1 UNIT DIAL) 100 UNIT/ML (KWIKPEN): 0.0000 [IU] | PEN_INJECTOR | Freq: Three times a day (TID) | SUBCUTANEOUS | 11 refills | Status: DC

## 2019-01-02 MED ORDER — AMIODARONE HCL 200 MG PO TABS: 200.0000 mg | ORAL_TABLET | Freq: Every day | ORAL | 4 refills | Status: DC

## 2019-01-02 MED ORDER — PREDNISONE 20 MG PO TABS: 20.0000 mg | ORAL_TABLET | Freq: Every day | ORAL | 0 refills | Status: DC

## 2019-01-02 MED ORDER — RIVAROXABAN 20 MG PO TABS: 20.0000 mg | ORAL_TABLET | Freq: Every day | ORAL | 1 refills | Status: DC

## 2019-01-02 MED ORDER — METOPROLOL TARTRATE 25 MG PO TABS: 25.0000 mg | ORAL_TABLET | Freq: Two times a day (BID) | ORAL | 5 refills | Status: DC

## 2019-01-02 MED ORDER — PANTOPRAZOLE SODIUM 40 MG PO TBEC: 40.0000 mg | DELAYED_RELEASE_TABLET | Freq: Every day | ORAL | 2 refills | Status: DC

## 2019-01-02 MED ORDER — AMOXICILLIN-POT CLAVULANATE 875-125 MG PO TABS
1.0000 | ORAL_TABLET | Freq: Two times a day (BID) | ORAL | Status: DC
  Administered 2019-01-02: 1 via ORAL
  Filled 2019-01-02: qty 1

## 2019-01-02 MED ORDER — ALBUTEROL SULFATE (2.5 MG/3ML) 0.083% IN NEBU: 2.5000 mg | INHALATION_SOLUTION | Freq: Four times a day (QID) | RESPIRATORY_TRACT | 12 refills | Status: DC | PRN

## 2019-01-02 MED ORDER — QUETIAPINE FUMARATE 25 MG PO TABS: 25.0000 mg | ORAL_TABLET | Freq: Every day | ORAL | 3 refills | Status: DC

## 2019-01-02 MED ORDER — AMOXICILLIN-POT CLAVULANATE 875-125 MG PO TABS: 1.0000 | ORAL_TABLET | Freq: Two times a day (BID) | ORAL | 0 refills | Status: AC

## 2019-01-02 MED ORDER — POTASSIUM CHLORIDE CRYS ER 20 MEQ PO TBCR: 20.0000 meq | EXTENDED_RELEASE_TABLET | Freq: Every day | ORAL | 1 refills | Status: AC

## 2019-01-02 NOTE — Discharge Instructions (Signed)
1)Humalog sliding scale coverage--- injection 0-9 Units 0-9 Units,  Subcutaneous, 3 times daily with meals, CBG < 70: Implement Hypoglycemia Standing Orders and refer to Hypoglycemia Standing Orders sidebar report  CBG 70 - 120: 0 units  CBG 121 - 150: 1 unit  CBG 151 - 200: 2 units  CBG 201 - 250: 3 units  CBG 251 - 300: 5 units  CBG 301 - 350: 7 units  CBG 351 - 400: 9 units CBG > 400:  2) you are taking Xarelto which is your blood thinner so avoid Avoid ibuprofen/Advil/Aleve/Motrin/Goody Powders/Naproxen/BC powders/Meloxicam/Diclofenac/Indomethacin and other Nonsteroidal anti-inflammatory medications as these will make you more likely to bleed and can cause stomach ulcers, can also cause Kidney problems.   3) please reschedule your outpatient sleep study  4)Very low-salt diet advised 5)Weigh yourself daily, call if you gain more than 3 pounds in 1 day or more than 5 pounds in 1 week as your diuretic medications may need to be adjusted 6)Limit your Fluid  intake to no more than 50 ounces (1.5 Liters) per day

## 2019-01-02 NOTE — TOC Transition Note (Signed)
Transition of Care [facility]) - CM/SW Discharge Note   Patient Details  Name: Eileen [name] MRN: [id number] Date of Birth: [birth year]  Transition of Care [facility]) CM/SW Contact:  Boneta Lucks, RN Phone Number: 01/02/2019, 4:35 PM   Clinical Narrative:  Patient discharging home.  Active with [facility] for RN/PT/SW.  Georgina Snell updated.      Barriers to Discharge: Barriers Resolved   Patient Goals and CMS Choice Patient states their goals for this hospitalization and ongoing recovery are:: family wants patient to return home; will assess again      Discharge Placement        Patient and family notified of of transfer: 01/02/19  Discharge Plan and Services   Discharge Planning Services: CM Consult Post Acute Care Choice: [place], Resumption of Svcs/PTA Provider               [facility] Agency: [place] Date [place]: 12/27/18 Time [place]: 25 Representative spoke with at [place]: Georgina Snell with Alvis Lemmings  Social Determinants of Health ([place]) Interventions     Readmission Risk Interventions Readmission Risk Prevention Plan 01/02/2019 12/11/2018 12/06/2018  Transportation Screening Complete - Complete  PCP or Specialist Appt within 5-7 Days - - -  Home Care Screening - - -  Medication Review (RN CM) - - -  Medication Review (RN Transport planner) Complete - Complete  PCP or Specialist appointment within 3-5 days of discharge Not Complete - Not Complete  HRI or Home Care Consult Complete - Complete  SW Recovery Care/Counseling Consult Complete - Complete  Palliative Care Screening Not Complete - Not Complete  Comments - - -  [place] Not Complete Not Applicable Not Complete  Some recent data might be hidden

## 2019-01-02 NOTE — Discharge Summary (Signed)
Eileen A River, is a 73 y.o. female  DOB August 24, 1945  MRN [id number].  Admission date:  12/26/2018  Admitting Physician  Murlean Iba, MD  Discharge Date:  01/02/2019   Primary MD  Lavella Lemons, PA  Recommendations for primary care physician for things to follow:  - 1)Humalog sliding scale coverage--- injection 0-9 Units 0-9 Units,  Subcutaneous, 3 times daily with meals, CBG < 70: Implement Hypoglycemia Standing Orders and refer to Hypoglycemia Standing Orders sidebar report  CBG 70 - 120: 0 units  CBG 121 - 150: 1 unit  CBG 151 - 200: 2 units  CBG 201 - 250: 3 units  CBG 251 - 300: 5 units  CBG 301 - 350: 7 units  CBG 351 - 400: 9 units CBG > 400:  2) you are taking Xarelto which is your blood thinner so avoid Avoid ibuprofen/Advil/Aleve/Motrin/Goody Powders/Naproxen/BC powders/Meloxicam/Diclofenac/Indomethacin and other Nonsteroidal anti-inflammatory medications as these will make you more likely to bleed and can cause stomach ulcers, can also cause Kidney problems.   3) please reschedule your outpatient sleep study  4)Very low-salt diet advised 5)Weigh yourself daily, call if you gain more than 3 pounds in 1 day or more than 5 pounds in 1 week as your diuretic medications may need to be adjusted 6)Limit your Fluid  intake to no more than 50 ounces (1.5 Liters) per day  Admission Diagnosis  Ankle pain, right [M25.571] Fall at home [W19.XXXA, Y92.009] Acute on chronic respiratory failure with hypercapnia (HCC) [J96.22] Community acquired pneumonia of right lower lobe of lung [J18.9] Sepsis without acute organ dysfunction, due to unspecified organism [facility]) [A41.9]   Discharge Diagnosis  Ankle pain, right [M25.571] Fall at home [W19.XXXA, Y92.009] Acute on chronic respiratory failure with hypercapnia (HCC) [J96.22] Community acquired pneumonia of right lower lobe of lung  [J18.9] Sepsis without acute organ dysfunction, due to unspecified organism ([place]) [A41.9]   Active Problems:   COPD (chronic obstructive pulmonary disease) ([place])   COPD with acute exacerbation ([place])   Acute on chronic respiratory failure ([place])   Diabetes mellitus ([place])   Chronic respiratory failure with hypoxia (HCC)   Leukocytosis   Breast cancer ([place])   Osteoporosis   Vitamin D deficiency   Anemia   Bronchiectasis with acute exacerbation (HCC)   Dementia with behavioral disturbance (HCC)   Acute respiratory failure with hypoxia and hypercapnia (HCC)   On home O2   CO2 narcosis   Acute and chronic respiratory failure with hypercapnia (HCC)   Pulmonary edema   Elevated brain natriuretic peptide (BNP) level      Past Medical History:  Diagnosis Date   Asthma    Atrial flutter (HCC)    Cancer ([place])    Right breast   COPD (chronic obstructive pulmonary disease) (HCC)    Dementia ([place])    Diverticulosis    Hemorrhoids    History of breast cancer    right breast   Hypertension    On home O2    Type 2 diabetes mellitus (  [facility])     Past Surgical History:  Procedure Laterality Date   BREAST SURGERY  2011   Right breast mastectomy   CARDIOVERSION N/A 06/27/2018   Procedure: CARDIOVERSION;  Surgeon: Arnoldo Lenis, MD;  [facility];  Service: Endoscopy;  Laterality: N/A;   CESAREAN SECTION     COLONOSCOPY N/A 01/22/2018   Procedure: COLONOSCOPY;  Surgeon: Rogene Houston, MD;  Location[facility];  Service: Endoscopy;  Laterality: N/A;   HERNIA REPAIR     RIH   IR VERTEBROPLASTY LUMBAR BX INC UNI/BIL INC/INJECT/IMAGING  09/05/2018   MASTECTOMY  2011   right breast   TEE WITHOUT CARDIOVERSION N/A 06/27/2018   Procedure: TRANSESOPHAGEAL ECHOCARDIOGRAM (TEE) WITH PROPOFOL;  Surgeon: Arnoldo Lenis, MD;  Location[facility];  Service: Endoscopy;  Laterality: N/A;       HPI  from the history and physical done on the day of  admission:    - Chief Complaint: weakness   Historian: Pt unable to give history due to dementia, altered mentation, history taken from ED providers, records  HPI: Eileen [name] is a 73 y.o. female with advanced end stage COPD, dementia, atrial flutter, hypertension, type 2 diabetes mellitus, right breast cancer, multiple recent hospitalizations for frailty, progressive lung disease and GI bleeding presented by EMS with weakness and encephalopathy.  The patient has been having increasing shortness of breath and mental status changes over the past several days.  Paramedics found patient to have an oxygen saturation of 80% and was nearly obtunded.  Of note, paramedics were not sure patient was receiving oxygen as she had 50 foot tubing in place and an oxygen concentrator that could not be confirmed was working properly.  She apparently has been having cough and chest congestion over the past several days.  She has been weak and has fallen down at home injuring her right ankle.  ED course: On arrival she was obtunded with oxygen saturations in the 70-80 range and coarse upper airway congestion noises heard and rapid response was required.  She was started on nebulizer treatments.  She received an ABG with findings of pH of 7.363, PCO2 81, PO2 55.  Glucose was 225.  BNP was 339.0.  White blood cell count was 12.6.  Hemoglobin was 9.  SARS 2 coronavirus test was negative.  Chest x-ray with findings of right basilar pneumonia and pulmonary edema.  Patient was started on BiPAP therapy after continuous nebulization was ordered.  Hospitalization was requested for further management.    Hospital Course:   - Brief Summary:-  73 y.o.femalewith advanced end stage COPD, dementia,atrial flutter, HTN, DM2, right breast cancer, multiple recent hospitalizations for frailty, progressive lung disease and GI bleeding presented by EMS with weakness and encephalopathy readmitted on 12/26/2018 with acute on chronic  hypoxic and hypercapnic respiratory failure   A/p  1)Acute on chronic respiratory failure with hypercapnia-much improved, -No longer requiring BiPAP --Consult from pulmonologist Dr. Luan Pulling appreciated -Suspect underlying OSA, patient will need sleep study as outpatient   2)Right Basilar Pneumonia- ??  Aspiration related, okay to discharge on p.o. Augmentin , completed meropenem mostly for ESBL UTI on 01/01/2019  3)HFpEF--last known EF 60 to 65%,-improved with iv Lasix  -Discharge home on oral Lasix  4) E. coli ESBL UTI--completed meropenem  on 01/01/2019 . 5)Dementia with behavioral disturbances- -stable, at this time   6)DM2-A1c 7.5 reflecting fair diabetic control, insulin regimen adjusted    7)Chronic Atrial Flutter-continue Xarelto for stroke prophylaxis  and metoprolol 25 mg twice daily, and Cardizem CD 180 mg daily  for rate control -Prior history of diverticular bleed, but no bleeding recently  8)Generalized weakness/deconditioning--physical therapist recommends SNF rehab, patient declines SNF rehab, -Patient and daughter requested discharge home with home health services instead. -PTA patient lived at home with daughter [name] and her grandson  9)Social/ethics--- currently full code, palliative care consult for goals of care and CODE STATUS appreciated,  DVT prophylaxis:Xarelto Code Status:Full Family Communication:phone call to daughter Disposition -Home with home health services  Consultants:  Pulmonology/palliative care services  Procedures:  BiPAP  Antimicrobials:  Meropenem10/17 >> scheduled to end 10/21  Augmentin upon discharge starting 01/02/2019  Code Status : Full code  Family Communication:   (patient is alert, awake and coherent) Discussed with Daughter [name] [phone])  Consults  :  Dr Hawkins/Palliative  DVT Prophylaxis  : xarelto  Discharge Condition: Stable  Diet and Activity recommendation:  As  advised  Discharge Instructions    Discharge Instructions    Call MD for:  difficulty breathing, headache or visual disturbances   Complete by: As directed    Call MD for:  persistant dizziness or light-headedness   Complete by: As directed    Call MD for:  persistant nausea and vomiting   Complete by: As directed    Call MD for:  temperature >100.4   Complete by: As directed    Diet - low sodium heart healthy   Complete by: As directed    Diet Carb Modified   Complete by: As directed    Discharge instructions   Complete by: As directed    1)Humalog sliding scale coverage--- injection 0-9 Units 0-9 Units,  Subcutaneous, 3 times daily with meals, CBG < 70: Implement Hypoglycemia Standing Orders and refer to Hypoglycemia Standing Orders sidebar report  CBG 70 - 120: 0 units  CBG 121 - 150: 1 unit  CBG 151 - 200: 2 units  CBG 201 - 250: 3 units  CBG 251 - 300: 5 units  CBG 301 - 350: 7 units  CBG 351 - 400: 9 units CBG > 400:  2) you are taking Xarelto which is your blood thinner so avoid Avoid ibuprofen/Advil/Aleve/Motrin/Goody Powders/Naproxen/BC powders/Meloxicam/Diclofenac/Indomethacin and other Nonsteroidal anti-inflammatory medications as these will make you more likely to bleed and can cause stomach ulcers, can also cause Kidney problems.   3) please reschedule your outpatient sleep study  4)Very low-salt diet advised 5)Weigh yourself daily, call if you gain more than 3 pounds in 1 day or more than 5 pounds in 1 week as your diuretic medications may need to be adjusted 6)Limit your Fluid  intake to no more than 50 ounces (1.5 Liters) per day   Increase activity slowly   Complete by: As directed        Discharge Medications    Allergies as of 01/02/2019      Reactions   Codeine Other (See Comments)   "jittery"      Medication List    STOP taking these medications   nitrofurantoin 100 MG capsule Commonly known as: MACRODANTIN     TAKE these medications    acetaminophen 500 MG tablet Commonly known as: TYLENOL Take 500 mg by mouth every 8 (eight) hours as needed for mild pain or headache.   albuterol (2.5 MG/3ML) 0.083% nebulizer solution Commonly known as: PROVENTIL Take 3 mLs (2.5 mg total) by nebulization every 6 (six) hours as needed for wheezing or shortness of breath.   amiodarone 200  MG tablet Commonly known as: PACERONE Take 1 tablet (200 mg total) by mouth daily.   amoxicillin-clavulanate 875-125 MG tablet Commonly known as: AUGMENTIN Take 1 tablet by mouth 2 (two) times daily for 3 days.   diltiazem 180 MG 24 hr capsule Commonly known as: CARDIZEM CD Take 1 capsule (180 mg total) by mouth daily.   furosemide 40 MG tablet Commonly known as: LASIX Take 1.5 tablets (60 mg total) by mouth daily.   insulin glargine 100 UNIT/ML injection Commonly known as: LANTUS Inject 0.14 mLs (14 Units total) into the skin at bedtime.   insulin lispro 100 UNIT/ML KwikPen Commonly known as: HumaLOG KwikPen Inject 0-0.09 mLs (0-9 Units total) into the skin 4 (four) times daily -  before meals and at bedtime. insulin aspart (novoLOG) injection 0-9 Units 0-9 Units, Subcutaneous, 3 times daily with meals, CBG < 70: Implement Hypoglycemia Standing Orders and refer to Hypoglycemia Standing Orders sidebar report  CBG 70 - 120: 0 units CBG 121 - 150: 1 unit CBG 151 - 200: 2 units CBG 201 - 250: 3 units CBG 251 - 300: 5 units CBG 301 - 350: 7 units CBG 351 - 400: 9 units CBG > 400:   metFORMIN 1000 MG (MOD) 24 hr tablet Commonly known as: GLUMETZA Take 1 tablet (1,000 mg total) by mouth 2 (two) times daily with a meal. What changed: medication strength   metoprolol tartrate 25 MG tablet Commonly known as: LOPRESSOR Take 1 tablet (25 mg total) by mouth 2 (two) times daily.   pantoprazole 40 MG tablet Commonly known as: PROTONIX Take 1 tablet (40 mg total) by mouth daily.   potassium chloride SA 20 MEQ tablet Commonly known as: KLOR-CON Take  1 tablet (20 mEq total) by mouth daily. While Taking Lasix What changed:   when to take this  reasons to take this  additional instructions   predniSONE 20 MG tablet Commonly known as: Deltasone Take 1 tablet (20 mg total) by mouth daily with breakfast. What changed:   how much to take  how to take this  when to take this  additional instructions   QUEtiapine 25 MG tablet Commonly known as: SEROQUEL Take 1 tablet (25 mg total) by mouth at bedtime. What changed: when to take this   rivaroxaban 20 MG Tabs tablet Commonly known as: Xarelto Take 1 tablet (20 mg total) by mouth daily with supper. What changed: additional instructions   traMADol 50 MG tablet Commonly known as: ULTRAM Take 50 mg by mouth 3 (three) times daily.      Major procedures and Radiology Reports - PLEASE review detailed and final reports for all details, in brief -   Dg Chest 2 View  Result Date: 12/05/2018 CLINICAL DATA:  Bright red blood in the stool and rectal pain beginning today. History of hemorrhoids. EXAM: CHEST - 2 VIEW COMPARISON:  11/19/2018 FINDINGS: Cardiac silhouette is mildly enlarged. No mediastinal or hilar masses. No evidence of adenopathy. Chronic prominence of the bronchovascular markings. Additional linear lung base opacities consistent with scarring or atelectasis. No evidence of pneumonia or pulmonary edema. No pleural effusion or pneumothorax. Previously treated compression fracture at the thoracolumbar junction. No acute skeletal abnormality. IMPRESSION: No acute cardiopulmonary disease. Electronically Signed   By: Lajean Manes M.D.   On: 12/05/2018 18:02   Dg Ankle 2 Views Right  Result Date: 12/26/2018 CLINICAL DATA:  Right ankle pain. EXAM: RIGHT ANKLE - 2 VIEW COMPARISON:  No recent. FINDINGS: Diffuse soft tissue swelling. No  acute bony abnormality identified. No evidence of fracture. Corticated bony densities noted adjacent to the mediolateral malleoli. These consistent  old fracture fragments. IMPRESSION: 1.  Diffuse soft tissue swelling.  No acute bony abnormality. 2. Corticated bony densities noted adjacent to the medial and lateral malleoli consistent with old fracture fragments. Electronically Signed   By: Marcello Moores  Register   On: 12/26/2018 14:11   Ct Abdomen Pelvis W Contrast  Result Date: 12/05/2018 CLINICAL DATA:  73 year old female with a history of GI bleed EXAM: CT ABDOMEN AND PELVIS WITH CONTRAST TECHNIQUE: Multidetector CT imaging of the abdomen and pelvis was performed using the standard protocol following bolus administration of intravenous contrast. CONTRAST:  155mL OMNIPAQUE IOHEXOL 300 MG/ML  SOLN COMPARISON:  04/21/2018 FINDINGS: Lower chest: Scarring/atelectasis at the lung bases. Hepatobiliary: Unremarkable liver.  Unremarkable gallbladder. Pancreas: Unremarkable Spleen: Unremarkable Adrenals/Urinary Tract: Unremarkable appearance of the bilateral adrenal glands. Right kidney with no hydronephrosis or nephrolithiasis. Bosniak 1 cyst on the medial cortex of the right kidney on image 22. Additional subcentimeter low-density lesion on the anterior cortex of the right kidney on image 27 too small to characterize. Left kidney without hydronephrosis. Nonobstructive nephrolithiasis at the inferior collecting system, with stone measuring approximately 4 mm. Bosniak 1 cyst on the posterior cortex of the left kidney on image 26. Bosniak 1 cyst on the medial anterior cortex of the left kidney on image 28. There are additional low-density lesions which are too small to characterize. Urinary bladder is relatively decompressed. Stomach/Bowel: Small hiatal hernia. Otherwise unremarkable stomach. Small bowel unremarkable. No abnormal distention. No focal wall thickening or inflammatory changes. No transition point. Normal appendix. Mild stool burden. Colonic diverticular disease throughout the colon. Retained contrast or inspissated secretions within numerous diverticula. No  focal inflammatory changes are evident. No fluid within the bowel or contrast accumulation. Vascular/Lymphatic: Atherosclerotic changes of the abdominal aorta. No aneurysm or dissection. Mesenteric arteries and renal arteries are patent. Bilateral iliac arteries and proximal femoral arteries are patent. No adenopathy. Reproductive: Focal low-density fluid or tissue within the endometrial canal on image 58. Unremarkable left and right ovary. Other: Fat containing umbilical hernia with ventral diastasis. Small loop of small bowel at this site without entrapment. Musculoskeletal: Fracture of L1, treated with vertebral augmentation. Degree of posterior retropulsion similar to the comparison. Are no acute displaced fracture. IMPRESSION: No acute CT finding of the abdomen, with no CT evidence to identify source of GI hemorrhage. Colonic diverticula without evidence of acute inflammation. Hiatal hernia. Trace fluid or tissue within the endometrial canal, unexpected in a patient of this age. Referral for gynecologic follow-up is recommended, and potentially evaluation with ultrasound or MRI as early malignancy cannot be excluded. Aortic Atherosclerosis (ICD10-I70.0). Left-sided nonobstructive nephrolithiasis. Electronically Signed   By: Corrie Mckusick D.O.   On: 12/05/2018 18:05   Dg Chest Port 1 View  Result Date: 12/29/2018 CLINICAL DATA:  Pulmonary edema. EXAM: PORTABLE CHEST 1 VIEW COMPARISON:  12/27/2018 FINDINGS: Persistent basilar chest densities could represent a combination of pleural effusions and atelectasis, left side greater than right. Heart size remains enlarged. Asymmetric prominent interstitial lung markings, right side greater than left, are similar to the previous examination. Negative for a pneumothorax. Trachea is midline. IMPRESSION: 1. Stable chest radiograph findings. Minimal change since 12/27/2018. 2. Persistent basilar chest densities could represent a combination of pleural effusions and  atelectasis. 3. Asymmetric interstitial lung densities may represent mild edema. Electronically Signed   By: Markus Daft M.D.   On: 12/29/2018 11:48  Dg Chest Port 1 View  Result Date: 12/27/2018 CLINICAL DATA:  Pulmonary edema EXAM: PORTABLE CHEST 1 VIEW COMPARISON:  Radiograph 12/26/2018 FINDINGS: Cardiomegaly with bilateral interstitial opacities asymmetrically increased in the right lung with bilateral pleural effusions. More confluent opacity is present in the right lung base. Overall appearance is not significantly changed from 1 day prior. Stable cardiomegaly. No pneumothorax. IMPRESSION: Appearance not significantly changed from 1 day prior with features of pulmonary edema and bilateral effusions. More confluent opacity in the right base possibly reflecting atelectasis or pneumonia. Stable cardiomegaly. Electronically Signed   By: Lovena Le M.D.   On: 12/27/2018 04:21   Dg Chest Port 1 View  Result Date: 12/26/2018 CLINICAL DATA:  Generalized weakness and worsening shortness of breath with exertion. Status post fall yesterday. EXAM: PORTABLE CHEST 1 VIEW COMPARISON:  Single-view of the chest 12/08/2018 and 11/19/2018. FINDINGS: There is cardiomegaly and pulmonary edema with small bilateral pleural effusions. Right basilar airspace disease is seen. No pneumothorax. No acute bony abnormality. IMPRESSION: Pulmonary edema with associated small effusions, larger on the right. Right basilar airspace disease could be atelectasis or pneumonia. Electronically Signed   By: Inge Rise M.D.   On: 12/26/2018 11:32   Dg Chest Port 1 View  Result Date: 12/08/2018 CLINICAL DATA:  Dyspnea EXAM: PORTABLE CHEST 1 VIEW COMPARISON:  Radiograph 12/05/2018, CT 09/27/2018 FINDINGS: Lung volumes are diminished with areas of basilar atelectasis. More hazy interstitial opacities present throughout the lower lobes with cephalized, indistinct vascularity and borderline cardiomegaly. More coalescent opacities seen  in the right lung base. Region of left basilar scarring blunts the costophrenic sulcus. No visible effusions. No pneumothorax. No acute osseous or soft tissue abnormality. Degenerative changes are present in the imaged spine and shoulders. IMPRESSION: 1. Findings consistent with congestive heart failure. 2. More coalescent opacities in the right lung base could reflect superimposed alveolar edema or pneumonia. 3. Lung volumes are diminished with bibasilar atelectasis. Electronically Signed   By: Lovena Le M.D.   On: 12/08/2018 20:05   Micro Results    Recent Results (from the past 240 hour(s))  Urine Culture     Status: Abnormal   Collection Time: 12/26/18 10:43 AM   Specimen: Urine, Catheterized  Result Value Ref Range Status   Specimen Description   Final    URINE, CATHETERIZED Performed at [facility], [address] [place], [place] [address]    Special Requests   Final    NONE Performed at [facility], [address] [place], [place] [address]    Culture (A)  Final    >=100,000 COLONIES/mL ESCHERICHIA COLI Confirmed Extended Spectrum Beta-Lactamase Producer (ESBL).  In bloodstream infections from ESBL organisms, carbapenems are preferred over piperacillin/tazobactam. They are shown to have a lower risk of mortality.    Report Status 12/29/2018 FINAL  Final   Organism ID, Bacteria ESCHERICHIA COLI (A)  Final      Susceptibility   Escherichia coli - MIC*    AMPICILLIN >=32 RESISTANT Resistant     CEFAZOLIN >=64 RESISTANT Resistant     CEFTRIAXONE >=64 RESISTANT Resistant     CIPROFLOXACIN >=4 RESISTANT Resistant     GENTAMICIN <=1 SENSITIVE Sensitive     IMIPENEM <=0.25 SENSITIVE Sensitive     NITROFURANTOIN 32 SENSITIVE Sensitive     TRIMETH/SULFA <=20 SENSITIVE Sensitive     AMPICILLIN/SULBACTAM >=32 RESISTANT Resistant     PIP/TAZO <=4 SENSITIVE Sensitive     Extended ESBL POSITIVE Resistant     * >=100,000 COLONIES/mL  ESCHERICHIA COLI  SARS Coronavirus 2 by RT PCR  (hospital order, performed in [facility] hospital lab) Nasopharyngeal Nasopharyngeal Swab     Status: None   Collection Time: 12/26/18 10:46 AM   Specimen: Nasopharyngeal Swab  Result Value Ref Range Status   SARS Coronavirus 2 NEGATIVE NEGATIVE Final    Comment: (NOTE) If result is NEGATIVE SARS-CoV-2 target nucleic acids are NOT DETECTED. The SARS-CoV-2 RNA is generally detectable in upper and lower  respiratory specimens during the acute phase of infection. The lowest  concentration of SARS-CoV-2 viral copies this assay can detect is 250  copies / mL. A negative result does not preclude SARS-CoV-2 infection  and should not be used as the sole basis for treatment or other  patient management decisions.  A negative result may occur with  improper specimen collection / handling, submission of specimen other  than nasopharyngeal swab, presence of viral mutation(s) within the  areas targeted by this assay, and inadequate number of viral copies  (<250 copies / mL). A negative result must be combined with clinical  observations, patient history, and epidemiological information. If result is POSITIVE SARS-CoV-2 target nucleic acids are DETECTED. The SARS-CoV-2 RNA is generally detectable in upper and lower  respiratory specimens dur ing the acute phase of infection.  Positive  results are indicative of active infection with SARS-CoV-2.  Clinical  correlation with patient history and other diagnostic information is  necessary to determine patient infection status.  Positive results do  not rule out bacterial infection or co-infection with other viruses. If result is PRESUMPTIVE POSTIVE SARS-CoV-2 nucleic acids MAY BE PRESENT.   A presumptive positive result was obtained on the submitted specimen  and confirmed on repeat testing.  While 2019 novel coronavirus  (SARS-CoV-2) nucleic acids may be present in the submitted sample  additional confirmatory testing may be necessary for  epidemiological  and / or clinical management purposes  to differentiate between  SARS-CoV-2 and other Sarbecovirus currently known to infect humans.  If clinically indicated additional testing with an alternate test  methodology [id number]) is advised. The SARS-CoV-2 RNA is generally  detectable in upper and lower respiratory sp ecimens during the acute  phase of infection. The expected result is Negative. Fact Sheet for Patients:  StrictlyIdeas.no Fact Sheet for Healthcare Providers: BankingDealers.co.za This test is not yet approved or cleared by the [place] FDA and has been authorized for detection and/or diagnosis of SARS-CoV-2 by FDA under an Emergency Use Authorization (EUA).  This EUA will remain in effect (meaning this test can be used) for the duration of the COVID-19 declaration under Section 564(b)(1) of the Act, 21 U.S.C. section 360bbb-3(b)(1), unless the authorization is terminated or revoked sooner. Performed at [facility], [address], [place], [place] [address]   Blood Culture (routine x 2)     Status: None   Collection Time: 12/26/18 12:12 PM   Specimen: BLOOD LEFT HAND  Result Value Ref Range Status   Specimen Description BLOOD LEFT HAND  Final   Special Requests   Final    BOTTLES DRAWN AEROBIC AND ANAEROBIC Blood Culture results may not be optimal due to an inadequate volume of blood received in culture bottles   Culture   Final    NO GROWTH 5 DAYS Performed at [facility], [address], [place], [place] [address]    Report Status 12/31/2018 FINAL  Final  Blood Culture (routine x 2)     Status: None   Collection Time: 12/26/18 12:12  PM   Specimen: BLOOD LEFT HAND  Result Value Ref Range Status   Specimen Description BLOOD LEFT HAND  Final   Special Requests   Final    BOTTLES DRAWN AEROBIC AND ANAEROBIC Blood Culture results may not be optimal due to an inadequate volume of blood received in culture  bottles   Culture   Final    NO GROWTH 5 DAYS Performed at [facility], [address] [place], [place] [address]    Report Status 12/31/2018 FINAL  Final  MRSA PCR Screening     Status: None   Collection Time: 12/26/18  8:18 PM   Specimen: Nasal Mucosa; Nasopharyngeal  Result Value Ref Range Status   MRSA by PCR NEGATIVE NEGATIVE Final    Comment:        The GeneXpert MRSA Assay (FDA approved for NASAL specimens only), is one component of a comprehensive MRSA colonization surveillance program. It is not intended to diagnose MRSA infection nor to guide or monitor treatment for MRSA infections. Performed at [facility], [address], [place], [place] [address]     Today   Subjective    Eileen Buswell today has no new complaints, -Shortness of breath has improved significantly No nausea, vomiting, diarrhea   No chest pains,   no fevers or chills        Patient has been seen and examined prior to discharge   Objective   Blood pressure (!) 102/55, pulse 73, temperature 97.7 F (36.5 C), temperature source Oral, resp. rate 17, height 5\' 2"  (1.575 m), weight 66.9 kg, SpO2 95 %.   Intake/Output Summary (Last 24 hours) at 01/02/2019 1434 Last data filed at 01/02/2019 0200 Gross per 24 hour  Intake --  Output 1350 ml  Net -1350 ml    Exam Gen:- Awake Alert, no acute distress  HEENT:- [place].AT, No sclera icterus Neck-Supple Neck,No JVD,.  Lungs-improved air movement, no wheezing  CV- S1, S2 normal, irregular Abd-  +ve B.Sounds, Abd Soft, No tenderness,    Extremity/Skin:- No  edema,   good pulses Psych-affect is appropriate, oriented x3 Neuro-no new focal deficits, no tremors    Data Review   CBC w Diff:  Lab Results  Component Value Date   WBC 9.7 12/31/2018   HGB 8.0 (L) 12/31/2018   HCT 28.7 (L) 12/31/2018   PLT 272 12/31/2018   LYMPHOPCT 3 12/31/2018   MONOPCT 4 12/31/2018   EOSPCT 0 12/31/2018   BASOPCT 0 12/31/2018    CMP:  Lab Results   Component Value Date   NA 143 01/02/2019   K 3.4 (L) 01/02/2019   CL 85 (L) 01/02/2019   CO2 46 (H) 01/02/2019   BUN 33 (H) 01/02/2019   CREATININE 0.53 01/02/2019   PROT 5.5 (L) 12/31/2018   ALBUMIN 3.0 (L) 12/31/2018   BILITOT 0.4 12/31/2018   ALKPHOS 62 12/31/2018   AST 9 (L) 12/31/2018   ALT 14 12/31/2018  .   Total Discharge time is about 33 minutes  Roxan Hockey M.D on 01/02/2019 at 2:34 PM  Go to [email or web address] -  for contact info  [facility] - Office  [phone]

## 2019-01-02 NOTE — TOC Progression Note (Signed)
Transition of Care [facility]) - Progression Note    Patient Details  Name: Eileen [name] MRN: [id number] Date of Birth: Sep 02, 1945  Transition [facility]) CM/SW Contact  Boneta Lucks, RN Phone Number: 01/02/2019, 1:38 PM  Clinical Narrative: Patient scheduled for sleep study 01/07/19.  Daughter states patient needs another COVID test before the study. MD requesting CM call sleep study to confirm and see if they will accept [place] repeating test today. CM called sleep lab, they will not allow any studies to be done until the patient has been out of the hospital for 2 weeks.  They will call patient to reschedule. MD updated.       Expected Discharge Plan: Strong Barriers to Discharge: Continued Medical Work up  Expected Discharge Plan and Services Expected Discharge Plan: [place]   Discharge Planning Services: CM Consult Post Acute Care Choice: [place], Resumption of Svcs/PTA Provider       [facility] Agency: [place] Date [place]: 12/27/18 Time [place]: [id number] Representative spoke with at [place]: Georgina Snell with Alvis Lemmings   Social Determinants of Health ([place]) Interventions    Readmission Risk Interventions Readmission Risk Prevention Plan 12/11/2018 12/06/2018 11/20/2018  Transportation Screening - Complete -  PCP or Specialist Appt within 5-7 Days - - -  Home Care Screening - - -  Medication Review (RN CM) - - -  Medication Review (RN Transport planner) - Complete -  PCP or Specialist appointment within 3-5 days of discharge - Not Complete -  [place] or [place] - Complete -  SW Recovery Care/Counseling Consult - Complete -  Palliative Care Screening - Not Complete Complete  Comments - - -  [place] Not Applicable Not Complete -  Some recent data might be hidden

## 2019-01-02 NOTE — Progress Notes (Signed)
Subjective: She says she feels okay.  She did all right last night without BiPAP.  She is oxygenating at 100% on nasal cannula.  No new complaints.  Palliative consultation noted and appreciated.  Objective: Vital signs in last 24 hours: Temp:  [97.7 F (36.5 C)-98.7 F (37.1 C)] 98.4 F (36.9 C) (10/22 0400) Pulse Rate:  [62-86] 71 (10/22 0500) Resp:  [13-26] 16 (10/22 0500) BP: (105-127)/(52-95) 112/74 (10/22 0500) SpO2:  [90 %-100 %] 100 % (10/22 0500) Weight:  [78.6 kg] 78.6 kg (10/22 0500) Weight change: 12.5 kg Last BM Date: 01/01/19  Intake/Output from previous day: 10/21 0701 - 10/22 0700 In: -  Out: 1350 [Urine:1350]  PHYSICAL EXAM General appearance: Sleepy but arousable.  No distress now. Resp: She has bilateral rhonchi right greater than left Cardio: regular rate and rhythm, S1, S2 normal, no murmur, click, rub or gallop GI: soft, non-tender; bowel sounds normal; no masses,  no organomegaly Extremities: extremities normal, atraumatic, no cyanosis or edema  Lab Results:  Results for orders placed or performed during the hospital encounter of 12/26/18 (from the past 48 hour(s))  Glucose, capillary     Status: Abnormal   Collection Time: 12/31/18  7:09 AM  Result Value Ref Range   Glucose-Capillary 218 (H) 70 - 99 mg/dL  Glucose, capillary     Status: Abnormal   Collection Time: 12/31/18 11:19 AM  Result Value Ref Range   Glucose-Capillary 339 (H) 70 - 99 mg/dL  Glucose, capillary     Status: Abnormal   Collection Time: 12/31/18  4:01 PM  Result Value Ref Range   Glucose-Capillary 152 (H) 70 - 99 mg/dL  Glucose, capillary     Status: Abnormal   Collection Time: 12/31/18  9:02 PM  Result Value Ref Range   Glucose-Capillary 141 (H) 70 - 99 mg/dL   Comment 1 Notify RN    Comment 2 Document in Chart   Glucose, capillary     Status: Abnormal   Collection Time: 01/01/19  2:48 AM  Result Value Ref Range   Glucose-Capillary 124 (H) 70 - 99 mg/dL   Comment 1  Notify RN    Comment 2 Document in Chart   Glucose, capillary     Status: Abnormal   Collection Time: 01/01/19  7:31 AM  Result Value Ref Range   Glucose-Capillary 173 (H) 70 - 99 mg/dL  Glucose, capillary     Status: Abnormal   Collection Time: 01/01/19 11:04 AM  Result Value Ref Range   Glucose-Capillary 360 (H) 70 - 99 mg/dL  Glucose, capillary     Status: Abnormal   Collection Time: 01/01/19  4:38 PM  Result Value Ref Range   Glucose-Capillary 264 (H) 70 - 99 mg/dL  Glucose, capillary     Status: Abnormal   Collection Time: 01/01/19 10:15 PM  Result Value Ref Range   Glucose-Capillary 140 (H) 70 - 99 mg/dL  Glucose, capillary     Status: Abnormal   Collection Time: 01/02/19  2:43 AM  Result Value Ref Range   Glucose-Capillary 179 (H) 70 - 99 mg/dL  Basic metabolic panel     Status: Abnormal   Collection Time: 01/02/19  4:18 AM  Result Value Ref Range   Sodium 143 135 - 145 mmol/L   Potassium 3.4 (L) 3.5 - 5.1 mmol/L   Chloride 85 (L) 98 - 111 mmol/L   CO2 46 (H) 22 - 32 mmol/L   Glucose, Bld 171 (H) 70 - 99 mg/dL   BUN  33 (H) 8 - 23 mg/dL   Creatinine, Ser 0.53 0.44 - 1.00 mg/dL   Calcium 9.2 8.9 - 10.3 mg/dL   GFR calc non Af Amer >60 >60 mL/min   GFR calc Af Amer >60 >60 mL/min   Anion gap 12 5 - 15    Comment: Performed at [facility], [address], [place], [place] [address]    ABGS No results for input(s): PHART, PO2ART, TCO2, HCO3 in the last 72 hours.  Invalid input(s): PCO2 CULTURES Recent Results (from the past 240 hour(s))  Urine Culture     Status: Abnormal   Collection Time: 12/26/18 10:43 AM   Specimen: Urine, Catheterized  Result Value Ref Range Status   Specimen Description   Final    URINE, CATHETERIZED Performed at [facility], [address], [place], [place] [address]    Special Requests   Final    NONE Performed at [facility], [address] [place], [place] [address]    Culture (A)  Final    >=100,000 COLONIES/mL ESCHERICHIA  COLI Confirmed Extended Spectrum Beta-Lactamase Producer (ESBL).  In bloodstream infections from ESBL organisms, carbapenems are preferred over piperacillin/tazobactam. They are shown to have a lower risk of mortality.    Report Status 12/29/2018 FINAL  Final   Organism ID, Bacteria ESCHERICHIA COLI (A)  Final      Susceptibility   Escherichia coli - MIC*    AMPICILLIN >=32 RESISTANT Resistant     CEFAZOLIN >=64 RESISTANT Resistant     CEFTRIAXONE >=64 RESISTANT Resistant     CIPROFLOXACIN >=4 RESISTANT Resistant     GENTAMICIN <=1 SENSITIVE Sensitive     IMIPENEM <=0.25 SENSITIVE Sensitive     NITROFURANTOIN 32 SENSITIVE Sensitive     TRIMETH/SULFA <=20 SENSITIVE Sensitive     AMPICILLIN/SULBACTAM >=32 RESISTANT Resistant     PIP/TAZO <=4 SENSITIVE Sensitive     Extended ESBL POSITIVE Resistant     * >=100,000 COLONIES/mL ESCHERICHIA COLI  SARS Coronavirus 2 by RT PCR (hospital order, performed in [place] hospital lab) Nasopharyngeal Nasopharyngeal Swab     Status: None   Collection Time: 12/26/18 10:46 AM   Specimen: Nasopharyngeal Swab  Result Value Ref Range Status   SARS Coronavirus 2 NEGATIVE NEGATIVE Final    Comment: (NOTE) If result is NEGATIVE SARS-CoV-2 target nucleic acids are NOT DETECTED. The SARS-CoV-2 RNA is generally detectable in upper and lower  respiratory specimens during the acute phase of infection. The lowest  concentration of SARS-CoV-2 viral copies this assay can detect is 250  copies / mL. A negative result does not preclude SARS-CoV-2 infection  and should not be used as the sole basis for treatment or other  patient management decisions.  A negative result may occur with  improper specimen collection / handling, submission of specimen other  than nasopharyngeal swab, presence of viral mutation(s) within the  areas targeted by this assay, and inadequate number of viral copies  (<250 copies / mL). A negative result must be combined with clinical   observations, patient history, and epidemiological information. If result is POSITIVE SARS-CoV-2 target nucleic acids are DETECTED. The SARS-CoV-2 RNA is generally detectable in upper and lower  respiratory specimens dur ing the acute phase of infection.  Positive  results are indicative of active infection with SARS-CoV-2.  Clinical  correlation with patient history and other diagnostic information is  necessary to determine patient infection status.  Positive results do  not rule out bacterial infection or co-infection with other  viruses. If result is PRESUMPTIVE POSTIVE SARS-CoV-2 nucleic acids MAY BE PRESENT.   A presumptive positive result was obtained on the submitted specimen  and confirmed on repeat testing.  While 2019 novel coronavirus  (SARS-CoV-2) nucleic acids may be present in the submitted sample  additional confirmatory testing may be necessary for epidemiological  and / or clinical management purposes  to differentiate between  SARS-CoV-2 and other Sarbecovirus currently known to infect humans.  If clinically indicated additional testing with an alternate test  methodology [phone]) is advised. The SARS-CoV-2 RNA is generally  detectable in upper and lower respiratory sp ecimens during the acute  phase of infection. The expected result is Negative. Fact Sheet for Patients:  StrictlyIdeas.no Fact Sheet for Healthcare Providers: BankingDealers.co.za This test is not yet approved or cleared by the [place] FDA and has been authorized for detection and/or diagnosis of SARS-CoV-2 by FDA under an Emergency Use Authorization (EUA).  This EUA will remain in effect (meaning this test can be used) for the duration of the COVID-19 declaration under Section 564(b)(1) of the Act, 21 U.S.C. section 360bbb-3(b)(1), unless the authorization is terminated or revoked sooner. Performed at [facility], [address],  [place], [place] [address]   Blood Culture (routine x 2)     Status: None   Collection Time: 12/26/18 12:12 PM   Specimen: BLOOD LEFT HAND  Result Value Ref Range Status   Specimen Description BLOOD LEFT HAND  Final   Special Requests   Final    BOTTLES DRAWN AEROBIC AND ANAEROBIC Blood Culture results may not be optimal due to an inadequate volume of blood received in culture bottles   Culture   Final    NO GROWTH 5 DAYS Performed at [facility], [address], [place], [place] [address]    Report Status 12/31/2018 FINAL  Final  Blood Culture (routine x 2)     Status: None   Collection Time: 12/26/18 12:12 PM   Specimen: BLOOD LEFT HAND  Result Value Ref Range Status   Specimen Description BLOOD LEFT HAND  Final   Special Requests   Final    BOTTLES DRAWN AEROBIC AND ANAEROBIC Blood Culture results may not be optimal due to an inadequate volume of blood received in culture bottles   Culture   Final    NO GROWTH 5 DAYS Performed at [facility], [address], [place], [place] [address]    Report Status 12/31/2018 FINAL  Final  MRSA PCR Screening     Status: None   Collection Time: 12/26/18  8:18 PM   Specimen: Nasal Mucosa; Nasopharyngeal  Result Value Ref Range Status   MRSA by PCR NEGATIVE NEGATIVE Final    Comment:        The GeneXpert MRSA Assay (FDA approved for NASAL specimens only), is one component of a comprehensive MRSA colonization surveillance program. It is not intended to diagnose MRSA infection nor to guide or monitor treatment for MRSA infections. Performed at [facility], [address], [place], [place] [address]    Studies/Results: No results found.  Medications:  Prior to Admission:  Medications Prior to Admission  Medication Sig Dispense Refill Last Dose  . albuterol (PROVENTIL) (2.5 MG/3ML) 0.083% nebulizer solution Take 2.5 mg by nebulization every 6 (six) hours as needed for wheezing or shortness of breath.   unknown  . amiodarone (PACERONE) 200  MG tablet Take 200 mg by mouth daily.   unknown  . diltiazem (CARDIZEM CD) 180 MG 24 hr  capsule Take 1 capsule (180 mg total) by mouth daily. 30 capsule 1 unknown  . furosemide (LASIX) 40 MG tablet Take 1 tablet (40 mg total) by mouth daily as needed for fluid. (Patient taking differently: Take 60 mg by mouth daily. )   unknown  . metFORMIN (GLUCOPHAGE-XR) 500 MG 24 hr tablet Take 2 tablets (1,000 mg total) by mouth 2 (two) times daily with a meal. 120 tablet 1 unknown  . metoprolol tartrate (LOPRESSOR) 25 MG tablet Take 1 tablet (25 mg total) by mouth 2 (two) times daily. 60 tablet 0 unknonw  . nitrofurantoin (MACRODANTIN) 100 MG capsule Take 100 mg by mouth 2 (two) times daily.   unknown  . potassium chloride SA (KLOR-CON) 20 MEQ tablet Take 1 tablet (20 mEq total) by mouth daily as needed (take only when taking lasix). (Patient taking differently: Take 20 mEq by mouth 2 (two) times daily. )   unknown  . predniSONE (DELTASONE) 20 MG tablet Take 2 PO QAM x5 days (Patient taking differently: Take 40 mg by mouth daily with breakfast. Take 2 PO QAM x5 days) 10 tablet 0 unknown  . QUEtiapine (SEROQUEL) 25 MG tablet Take 1 tablet (25 mg total) by mouth daily at 6 PM. 30 tablet 3 unknown  . traMADol (ULTRAM) 50 MG tablet Take 50 mg by mouth 3 (three) times daily.   unknown  . acetaminophen (TYLENOL) 500 MG tablet Take 500 mg by mouth every 8 (eight) hours as needed for mild pain or headache.    unknown  . rivaroxaban (XARELTO) 20 MG TABS tablet Take 1 tablet (20 mg total) by mouth daily with supper. Stop Xarelto until Monday (08/26/18) in anticipation for kyphoplasty (Patient not taking: Reported on 12/26/2018) 21 tablet 0 Not Taking at Unknown time   Scheduled: . amiodarone  200 mg Oral Daily  . budesonide (PULMICORT) nebulizer solution  0.25 mg Nebulization BID  . Chlorhexidine Gluconate Cloth  6 each Topical Daily  . diltiazem  180 mg Oral Daily  . furosemide  40 mg Intravenous Q12H  . insulin aspart   0-20 Units Subcutaneous Q4H  . insulin aspart  8 Units Subcutaneous TID WC  . insulin glargine  14 Units Subcutaneous Q24H  . ipratropium-albuterol  3 mL Nebulization Q6H  . methylPREDNISolone (SOLU-MEDROL) injection  40 mg Intravenous Q12H  . metoprolol tartrate  25 mg Oral BID  . pantoprazole  40 mg Oral Daily  . QUEtiapine  25 mg Oral q1800  . rivaroxaban  20 mg Oral Q supper   Continuous: . sodium chloride Stopped (12/27/18 2243)   [id number] **OR** acetaminophen, haloperidol lactate, ondansetron **OR** ondansetron (ZOFRAN) IV, senna-docusate  Assesment: She was admitted with acute on chronic hypoxic and hypercapnic respiratory failure.  She was found basically obtunded at home and markedly hypoxic.  She has required BiPAP but has not required BiPAP the last 2 nights.  At baseline she has pretty severe COPD and she is being treated for that.  She has chronic hypoxic respiratory failure at home when she is on home oxygen but when EMS arrived at her home the work sure that her oxygen was working properly and she had an approximately 50 foot tubing so it is unlikely that she was getting the liter flow that is prescribed.  She has heart failure and she has been receiving IV Lasix.  This is acute on chronic diastolic heart failure.  She has pneumonia likely from aspiration when she was obtunded and that is been treated.  She  has an ESBL E. coli urinary tract infection.  She had been on Merrem and should complete that soon.  She has dementia which complicates her situation.  She likely has sleep apnea and she is scheduled for sleep study next week Active Problems:   COPD (chronic obstructive pulmonary disease) ([place])   COPD with acute exacerbation (HCC)   Acute on chronic respiratory failure ([place])   Diabetes mellitus ([place])   Chronic respiratory failure with hypoxia (HCC)   Leukocytosis   Breast cancer (HCC)   Osteoporosis   Vitamin D deficiency   Anemia   Bronchiectasis with  acute exacerbation (HCC)   Dementia with behavioral disturbance (HCC)   Acute respiratory failure with hypoxia and hypercapnia (HCC)   On home O2   CO2 narcosis   Acute and chronic respiratory failure with hypercapnia (HCC)   Pulmonary edema   Elevated brain natriuretic peptide (BNP) level    Plan: Continue current treatments she is improving slowly    LOS: 7 days   Alonza Bogus 01/02/2019, 7:05 AM

## 2019-01-03 ENCOUNTER — Telehealth: Payer: Medicare Other | Admitting: Cardiovascular Disease

## 2019-01-03 ENCOUNTER — Other Ambulatory Visit: Payer: Self-pay

## 2019-01-03 ENCOUNTER — Other Ambulatory Visit (HOSPITAL_COMMUNITY)
Admission: RE | Admit: 2019-01-03 | Discharge: 2019-01-03 | Disposition: A | Discharge status: Home / Self Care | Payer: Medicare Other | Source: Ambulatory Visit | Attending: Neurology | Admitting: Neurology

## 2019-01-09 DIAGNOSIS — J449 Chronic obstructive pulmonary disease, unspecified: Secondary | ICD-10-CM | POA: Diagnosis not present

## 2019-01-09 DIAGNOSIS — R0902 Hypoxemia: Secondary | ICD-10-CM | POA: Diagnosis not present

## 2019-01-10 DIAGNOSIS — I10 Essential (primary) hypertension: Secondary | ICD-10-CM | POA: Diagnosis not present

## 2019-01-10 DIAGNOSIS — E782 Mixed hyperlipidemia: Secondary | ICD-10-CM | POA: Diagnosis not present

## 2019-01-16 ENCOUNTER — Telehealth: Payer: Self-pay | Admitting: Internal Medicine

## 2019-01-16 NOTE — Telephone Encounter (Signed)
Left voicemail for patients daughter, Zyrah Morath regarding palliative care.

## 2019-01-22 ENCOUNTER — Other Ambulatory Visit: Payer: Self-pay

## 2019-01-22 ENCOUNTER — Other Ambulatory Visit (HOSPITAL_COMMUNITY)
Admission: RE | Admit: 2019-01-22 | Discharge: 2019-01-22 | Disposition: A | Discharge status: Home / Self Care | Payer: Medicare Other | Source: Ambulatory Visit | Attending: Neurology | Admitting: Neurology

## 2019-01-22 DIAGNOSIS — Z01812 Encounter for preprocedural laboratory examination: Secondary | ICD-10-CM | POA: Insufficient documentation

## 2019-01-22 DIAGNOSIS — Z20828 Contact with and (suspected) exposure to other viral communicable diseases: Secondary | ICD-10-CM | POA: Insufficient documentation

## 2019-01-22 LAB — SARS CORONAVIRUS 2 (TAT 6-24 HRS): SARS Coronavirus 2: NEGATIVE

## 2019-01-23 DIAGNOSIS — J449 Chronic obstructive pulmonary disease, unspecified: Secondary | ICD-10-CM | POA: Diagnosis not present

## 2019-01-23 DIAGNOSIS — I10 Essential (primary) hypertension: Secondary | ICD-10-CM | POA: Diagnosis not present

## 2019-01-23 DIAGNOSIS — J9611 Chronic respiratory failure with hypoxia: Secondary | ICD-10-CM | POA: Diagnosis not present

## 2019-01-23 DIAGNOSIS — I5032 Chronic diastolic (congestive) heart failure: Secondary | ICD-10-CM | POA: Diagnosis not present

## 2019-01-24 ENCOUNTER — Other Ambulatory Visit: Payer: Self-pay

## 2019-01-24 ENCOUNTER — Ambulatory Visit: Payer: Medicare Other | Attending: Pulmonary Disease | Admitting: Neurology

## 2019-01-24 DIAGNOSIS — Z794 Long term (current) use of insulin: Secondary | ICD-10-CM | POA: Insufficient documentation

## 2019-01-24 DIAGNOSIS — Z7901 Long term (current) use of anticoagulants: Secondary | ICD-10-CM | POA: Insufficient documentation

## 2019-01-24 DIAGNOSIS — G4733 Obstructive sleep apnea (adult) (pediatric): Secondary | ICD-10-CM | POA: Insufficient documentation

## 2019-01-24 DIAGNOSIS — G4730 Sleep apnea, unspecified: Secondary | ICD-10-CM

## 2019-01-24 DIAGNOSIS — Z79899 Other long term (current) drug therapy: Secondary | ICD-10-CM | POA: Diagnosis not present

## 2019-01-24 DIAGNOSIS — R0683 Snoring: Secondary | ICD-10-CM

## 2019-01-28 ENCOUNTER — Telehealth: Payer: Self-pay | Admitting: Internal Medicine

## 2019-01-28 ENCOUNTER — Other Ambulatory Visit: Payer: Self-pay

## 2019-01-28 NOTE — Patient Outreach (Signed)
[facility] [facility]) Care Management  01/28/2019  [name] [place] 1945/04/01 [id number]   Medication Adherence call to Mrs. [place] Bertini HIPPA Compliant Voice message left with a call back number. Mrs. Gierke is showing past due on Metformin Er 500 mg under [place].    [facility] Management Direct Dial [phone]  Fax [phone] [email or web address]

## 2019-01-28 NOTE — Telephone Encounter (Signed)
Called daughter, Michelle's cell to schedule Palliative Consult, no answer left message with reason for call along with my contact information.  I also called patient's home number which is also daughter [name]'s home number to schedule Consult, no answer - message with with my contact information.

## 2019-01-29 DIAGNOSIS — I10 Essential (primary) hypertension: Secondary | ICD-10-CM | POA: Diagnosis not present

## 2019-01-29 DIAGNOSIS — J9621 Acute and chronic respiratory failure with hypoxia: Secondary | ICD-10-CM | POA: Diagnosis not present

## 2019-01-29 DIAGNOSIS — J189 Pneumonia, unspecified organism: Secondary | ICD-10-CM | POA: Diagnosis not present

## 2019-01-29 DIAGNOSIS — J440 Chronic obstructive pulmonary disease with acute lower respiratory infection: Secondary | ICD-10-CM | POA: Diagnosis not present

## 2019-01-29 DIAGNOSIS — E119 Type 2 diabetes mellitus without complications: Secondary | ICD-10-CM | POA: Diagnosis not present

## 2019-01-30 ENCOUNTER — Telehealth: Payer: Medicare Other | Admitting: Cardiovascular Disease

## 2019-01-30 ENCOUNTER — Inpatient Hospital Stay (HOSPITAL_COMMUNITY)
Admission: EM | Admit: 2019-01-30 | Discharge: 2019-02-05 | DRG: 189 | Disposition: A | Discharge status: Home Health | Payer: Medicare Other | Attending: Family Medicine | Admitting: Family Medicine

## 2019-01-30 ENCOUNTER — Emergency Department (HOSPITAL_COMMUNITY): Payer: Medicare Other

## 2019-01-30 ENCOUNTER — Encounter (HOSPITAL_COMMUNITY): Payer: Self-pay | Admitting: Emergency Medicine

## 2019-01-30 ENCOUNTER — Other Ambulatory Visit: Payer: Self-pay

## 2019-01-30 DIAGNOSIS — Z833 Family history of diabetes mellitus: Secondary | ICD-10-CM

## 2019-01-30 DIAGNOSIS — J9621 Acute and chronic respiratory failure with hypoxia: Secondary | ICD-10-CM | POA: Diagnosis not present

## 2019-01-30 DIAGNOSIS — Z515 Encounter for palliative care: Secondary | ICD-10-CM

## 2019-01-30 DIAGNOSIS — I10 Essential (primary) hypertension: Secondary | ICD-10-CM | POA: Diagnosis not present

## 2019-01-30 DIAGNOSIS — J441 Chronic obstructive pulmonary disease with (acute) exacerbation: Secondary | ICD-10-CM | POA: Diagnosis not present

## 2019-01-30 DIAGNOSIS — Z87891 Personal history of nicotine dependence: Secondary | ICD-10-CM | POA: Diagnosis not present

## 2019-01-30 DIAGNOSIS — Z1612 Extended spectrum beta lactamase (ESBL) resistance: Secondary | ICD-10-CM | POA: Diagnosis not present

## 2019-01-30 DIAGNOSIS — J9691 Respiratory failure, unspecified with hypoxia: Secondary | ICD-10-CM | POA: Diagnosis not present

## 2019-01-30 DIAGNOSIS — J690 Pneumonitis due to inhalation of food and vomit: Secondary | ICD-10-CM | POA: Diagnosis not present

## 2019-01-30 DIAGNOSIS — R069 Unspecified abnormalities of breathing: Secondary | ICD-10-CM | POA: Diagnosis not present

## 2019-01-30 DIAGNOSIS — Z794 Long term (current) use of insulin: Secondary | ICD-10-CM

## 2019-01-30 DIAGNOSIS — Z79899 Other long term (current) drug therapy: Secondary | ICD-10-CM | POA: Diagnosis not present

## 2019-01-30 DIAGNOSIS — Z9981 Dependence on supplemental oxygen: Secondary | ICD-10-CM

## 2019-01-30 DIAGNOSIS — I110 Hypertensive heart disease with heart failure: Secondary | ICD-10-CM | POA: Diagnosis not present

## 2019-01-30 DIAGNOSIS — I480 Paroxysmal atrial fibrillation: Secondary | ICD-10-CM | POA: Diagnosis not present

## 2019-01-30 DIAGNOSIS — J9622 Acute and chronic respiratory failure with hypercapnia: Secondary | ICD-10-CM | POA: Diagnosis present

## 2019-01-30 DIAGNOSIS — B9620 Unspecified Escherichia coli [E. coli] as the cause of diseases classified elsewhere: Secondary | ICD-10-CM | POA: Diagnosis not present

## 2019-01-30 DIAGNOSIS — Z853 Personal history of malignant neoplasm of breast: Secondary | ICD-10-CM

## 2019-01-30 DIAGNOSIS — E872 Acidosis, unspecified: Secondary | ICD-10-CM

## 2019-01-30 DIAGNOSIS — E119 Type 2 diabetes mellitus without complications: Secondary | ICD-10-CM | POA: Diagnosis not present

## 2019-01-30 DIAGNOSIS — R0602 Shortness of breath: Secondary | ICD-10-CM | POA: Diagnosis not present

## 2019-01-30 DIAGNOSIS — I4892 Unspecified atrial flutter: Secondary | ICD-10-CM | POA: Diagnosis present

## 2019-01-30 DIAGNOSIS — Z7189 Other specified counseling: Secondary | ICD-10-CM | POA: Diagnosis not present

## 2019-01-30 DIAGNOSIS — J449 Chronic obstructive pulmonary disease, unspecified: Secondary | ICD-10-CM | POA: Diagnosis present

## 2019-01-30 DIAGNOSIS — Z8701 Personal history of pneumonia (recurrent): Secondary | ICD-10-CM

## 2019-01-30 DIAGNOSIS — I5032 Chronic diastolic (congestive) heart failure: Secondary | ICD-10-CM | POA: Diagnosis not present

## 2019-01-30 DIAGNOSIS — G9341 Metabolic encephalopathy: Secondary | ICD-10-CM | POA: Diagnosis present

## 2019-01-30 DIAGNOSIS — R0689 Other abnormalities of breathing: Secondary | ICD-10-CM | POA: Diagnosis not present

## 2019-01-30 DIAGNOSIS — I4891 Unspecified atrial fibrillation: Secondary | ICD-10-CM | POA: Diagnosis present

## 2019-01-30 DIAGNOSIS — Z20828 Contact with and (suspected) exposure to other viral communicable diseases: Secondary | ICD-10-CM | POA: Diagnosis present

## 2019-01-30 DIAGNOSIS — Z7901 Long term (current) use of anticoagulants: Secondary | ICD-10-CM

## 2019-01-30 DIAGNOSIS — Z9011 Acquired absence of right breast and nipple: Secondary | ICD-10-CM

## 2019-01-30 DIAGNOSIS — B9629 Other Escherichia coli [E. coli] as the cause of diseases classified elsewhere: Secondary | ICD-10-CM | POA: Diagnosis present

## 2019-01-30 DIAGNOSIS — R1310 Dysphagia, unspecified: Secondary | ICD-10-CM | POA: Diagnosis not present

## 2019-01-30 DIAGNOSIS — I272 Pulmonary hypertension, unspecified: Secondary | ICD-10-CM | POA: Diagnosis not present

## 2019-01-30 DIAGNOSIS — D509 Iron deficiency anemia, unspecified: Secondary | ICD-10-CM | POA: Diagnosis present

## 2019-01-30 DIAGNOSIS — F03918 Unspecified dementia, unspecified severity, with other behavioral disturbance: Secondary | ICD-10-CM | POA: Diagnosis present

## 2019-01-30 DIAGNOSIS — N179 Acute kidney failure, unspecified: Secondary | ICD-10-CM | POA: Diagnosis not present

## 2019-01-30 DIAGNOSIS — F0391 Unspecified dementia with behavioral disturbance: Secondary | ICD-10-CM | POA: Diagnosis present

## 2019-01-30 DIAGNOSIS — N390 Urinary tract infection, site not specified: Secondary | ICD-10-CM | POA: Diagnosis not present

## 2019-01-30 DIAGNOSIS — J471 Bronchiectasis with (acute) exacerbation: Secondary | ICD-10-CM | POA: Diagnosis present

## 2019-01-30 DIAGNOSIS — R000 Tachycardia, unspecified: Secondary | ICD-10-CM | POA: Diagnosis not present

## 2019-01-30 DIAGNOSIS — Z743 Need for continuous supervision: Secondary | ICD-10-CM | POA: Diagnosis not present

## 2019-01-30 LAB — BLOOD GAS, ARTERIAL
Acid-Base Excess: 11.5 mmol/L — ABNORMAL HIGH (ref 0.0–2.0)
Acid-Base Excess: 9.8 mmol/L — ABNORMAL HIGH (ref 0.0–2.0)
Acid-Base Excess: 16.3 mmol/L — ABNORMAL HIGH (ref 0.0–2.0)
Bicarbonate: 33.1 mmol/L — ABNORMAL HIGH (ref 20.0–28.0)
Bicarbonate: 32.8 mmol/L — ABNORMAL HIGH (ref 20.0–28.0)
Bicarbonate: 38.8 mmol/L — ABNORMAL HIGH (ref 20.0–28.0)
Drawn by: 38235
FIO2: 44
FIO2: 50
FIO2: 36
O2 Saturation: 81.9 %
O2 Saturation: 97.9 %
O2 Saturation: 84.9 %
Patient temperature: 37
Patient temperature: 38
Patient temperature: 36.6
pCO2 arterial: 109 mmHg (ref 32.0–48.0)
pCO2 arterial: 67.4 mmHg (ref 32.0–48.0)
pCO2 arterial: 66.8 mmHg (ref 32.0–48.0)
pH, Arterial: 7.188 — CL (ref 7.350–7.450)
pH, Arterial: 7.343 — ABNORMAL LOW (ref 7.350–7.450)
pH, Arterial: 7.413 (ref 7.350–7.450)
pO2, Arterial: 57.6 mmHg — ABNORMAL LOW (ref 83.0–108.0)
pO2, Arterial: 102 mmHg (ref 83.0–108.0)
pO2, Arterial: 51.6 mmHg — ABNORMAL LOW (ref 83.0–108.0)

## 2019-01-30 LAB — CBC WITH DIFFERENTIAL/PLATELET
Abs Immature Granulocytes: 0.07 10*3/uL (ref 0.00–0.07)
Basophils Absolute: 0.1 10*3/uL (ref 0.0–0.1)
Basophils Relative: 1 %
Eosinophils Absolute: 0.2 10*3/uL (ref 0.0–0.5)
Eosinophils Relative: 2 %
HCT: 36.9 % (ref 36.0–46.0)
Hemoglobin: 9.6 g/dL — ABNORMAL LOW (ref 12.0–15.0)
Immature Granulocytes: 1 %
Lymphocytes Relative: 33 %
Lymphs Abs: 3.2 10*3/uL (ref 0.7–4.0)
MCH: 25.1 pg — ABNORMAL LOW (ref 26.0–34.0)
MCHC: 26 g/dL — ABNORMAL LOW (ref 30.0–36.0)
MCV: 96.3 fL (ref 80.0–100.0)
Monocytes Absolute: 1.3 10*3/uL — ABNORMAL HIGH (ref 0.1–1.0)
Monocytes Relative: 14 %
Neutro Abs: 4.9 10*3/uL (ref 1.7–7.7)
Neutrophils Relative %: 49 %
Platelets: 401 10*3/uL — ABNORMAL HIGH (ref 150–400)
RBC: 3.83 MIL/uL — ABNORMAL LOW (ref 3.87–5.11)
RDW: 15.7 % — ABNORMAL HIGH (ref 11.5–15.5)
WBC: 9.8 10*3/uL (ref 4.0–10.5)
nRBC: 0 % (ref 0.0–0.2)

## 2019-01-30 LAB — BASIC METABOLIC PANEL
Anion gap: 19 — ABNORMAL HIGH (ref 5–15)
BUN: 28 mg/dL — ABNORMAL HIGH (ref 8–23)
CO2: 32 mmol/L (ref 22–32)
Calcium: 9.1 mg/dL (ref 8.9–10.3)
Chloride: 89 mmol/L — ABNORMAL LOW (ref 98–111)
Creatinine, Ser: 1.2 mg/dL — ABNORMAL HIGH (ref 0.44–1.00)
GFR calc Af Amer: 52 mL/min — ABNORMAL LOW (ref >60)
GFR calc non Af Amer: 44 mL/min — ABNORMAL LOW (ref >60)
Glucose, Bld: 134 mg/dL — ABNORMAL HIGH (ref 70–99)
Potassium: 4.7 mmol/L (ref 3.5–5.1)
Sodium: 140 mmol/L (ref 135–145)

## 2019-01-30 LAB — URINALYSIS, ROUTINE W REFLEX MICROSCOPIC
Bilirubin Urine: NEGATIVE
Glucose, UA: NEGATIVE mg/dL
Hgb urine dipstick: NEGATIVE
Ketones, ur: NEGATIVE mg/dL
Nitrite: NEGATIVE
Protein, ur: 30 mg/dL — AB
Specific Gravity, Urine: 1.013 (ref 1.005–1.030)
WBC, UA: >50 WBC/hpf — ABNORMAL HIGH (ref 0–5)
pH: 5 (ref 5.0–8.0)

## 2019-01-30 LAB — MRSA PCR SCREENING: MRSA by PCR: NEGATIVE

## 2019-01-30 LAB — SARS CORONAVIRUS 2 BY RT PCR (HOSPITAL ORDER, PERFORMED IN ~~LOC~~ HOSPITAL LAB): SARS Coronavirus 2: NEGATIVE

## 2019-01-30 LAB — LACTIC ACID, PLASMA
Lactic Acid, Venous: 4.1 mmol/L (ref 0.5–1.9)
Lactic Acid, Venous: 4.7 mmol/L (ref 0.5–1.9)
Lactic Acid, Venous: 2.4 mmol/L (ref 0.5–1.9)

## 2019-01-30 LAB — BRAIN NATRIURETIC PEPTIDE: B Natriuretic Peptide: 439 pg/mL — ABNORMAL HIGH (ref 0.0–100.0)

## 2019-01-30 LAB — GLUCOSE, CAPILLARY
Glucose-Capillary: 184 mg/dL — ABNORMAL HIGH (ref 70–99)
Glucose-Capillary: 158 mg/dL — ABNORMAL HIGH (ref 70–99)

## 2019-01-30 MED ORDER — PIPERACILLIN-TAZOBACTAM 3.375 G IVPB 30 MIN
3.3750 g | Freq: Once | INTRAVENOUS | Status: AC
  Administered 2019-01-30: 3.375 g via INTRAVENOUS
  Filled 2019-01-30: qty 50

## 2019-01-30 MED ORDER — ALBUTEROL SULFATE (2.5 MG/3ML) 0.083% IN NEBU: 2.5000 mg | INHALATION_SOLUTION | RESPIRATORY_TRACT | Status: DC | PRN

## 2019-01-30 MED ORDER — POLYETHYLENE GLYCOL 3350 17 G PO PACK: 17.0000 g | PACK | Freq: Every day | ORAL | Status: DC | PRN

## 2019-01-30 MED ORDER — LABETALOL HCL 5 MG/ML IV SOLN: 10.0000 mg | INTRAVENOUS | Status: DC | PRN

## 2019-01-30 MED ORDER — FUROSEMIDE 40 MG PO TABS
60.0000 mg | ORAL_TABLET | Freq: Every day | ORAL | Status: DC
  Administered 2019-01-30: 60 mg via ORAL
  Filled 2019-01-30: qty 1

## 2019-01-30 MED ORDER — METOPROLOL TARTRATE 25 MG PO TABS
25.0000 mg | ORAL_TABLET | Freq: Two times a day (BID) | ORAL | Status: DC
  Administered 2019-01-30 – 2019-02-05 (×10): 25 mg via ORAL
  Filled 2019-01-30 (×11): qty 1

## 2019-01-30 MED ORDER — SODIUM CHLORIDE 0.9% FLUSH: 3.0000 mL | INTRAVENOUS | Status: DC | PRN

## 2019-01-30 MED ORDER — ALBUTEROL (5 MG/ML) CONTINUOUS INHALATION SOLN
10.0000 mg/h | INHALATION_SOLUTION | Freq: Once | RESPIRATORY_TRACT | Status: AC
  Administered 2019-01-30: 10 mg/h via RESPIRATORY_TRACT
  Filled 2019-01-30: qty 20

## 2019-01-30 MED ORDER — INSULIN ASPART 100 UNIT/ML ~~LOC~~ SOLN
0.0000 [IU] | Freq: Every day | SUBCUTANEOUS | Status: DC
  Administered 2019-02-01 – 2019-02-04 (×3): 2 [IU] via SUBCUTANEOUS

## 2019-01-30 MED ORDER — VANCOMYCIN HCL IN DEXTROSE 1-5 GM/200ML-% IV SOLN
1000.0000 mg | Freq: Once | INTRAVENOUS | Status: AC
  Administered 2019-01-30: 1000 mg via INTRAVENOUS
  Filled 2019-01-30: qty 200

## 2019-01-30 MED ORDER — ONDANSETRON HCL 4 MG PO TABS: 4.0000 mg | ORAL_TABLET | Freq: Four times a day (QID) | ORAL | Status: DC | PRN

## 2019-01-30 MED ORDER — AMIODARONE HCL 200 MG PO TABS
200.0000 mg | ORAL_TABLET | Freq: Every day | ORAL | Status: DC
  Administered 2019-01-30 – 2019-02-05 (×7): 200 mg via ORAL
  Filled 2019-01-30 (×7): qty 1

## 2019-01-30 MED ORDER — MAGNESIUM SULFATE 2 GM/50ML IV SOLN
2.0000 g | Freq: Once | INTRAVENOUS | Status: AC
  Administered 2019-01-30: 2 g via INTRAVENOUS
  Filled 2019-01-30: qty 50

## 2019-01-30 MED ORDER — METHYLPREDNISOLONE SODIUM SUCC 40 MG IJ SOLR
40.0000 mg | Freq: Three times a day (TID) | INTRAMUSCULAR | Status: DC
  Administered 2019-01-30 – 2019-02-01 (×7): 40 mg via INTRAVENOUS
  Filled 2019-01-30 (×7): qty 1

## 2019-01-30 MED ORDER — CHLORHEXIDINE GLUCONATE CLOTH 2 % EX PADS
6.0000 | MEDICATED_PAD | Freq: Every day | CUTANEOUS | Status: DC
  Administered 2019-01-30 – 2019-02-05 (×7): 6 via TOPICAL

## 2019-01-30 MED ORDER — SODIUM CHLORIDE 0.9 % IV SOLN: 250.0000 mL | INTRAVENOUS | Status: DC | PRN

## 2019-01-30 MED ORDER — ALBUTEROL SULFATE (2.5 MG/3ML) 0.083% IN NEBU
5.0000 mg | INHALATION_SOLUTION | Freq: Once | RESPIRATORY_TRACT | Status: AC
  Administered 2019-01-30: 5 mg via RESPIRATORY_TRACT
  Filled 2019-01-30: qty 6

## 2019-01-30 MED ORDER — SODIUM CHLORIDE 0.9% FLUSH
3.0000 mL | Freq: Two times a day (BID) | INTRAVENOUS | Status: DC
  Administered 2019-01-30 – 2019-02-05 (×13): 3 mL via INTRAVENOUS

## 2019-01-30 MED ORDER — ACETAMINOPHEN 650 MG RE SUPP: 650.0000 mg | Freq: Four times a day (QID) | RECTAL | Status: DC | PRN

## 2019-01-30 MED ORDER — DEXAMETHASONE SODIUM PHOSPHATE 10 MG/ML IJ SOLN
10.0000 mg | Freq: Once | INTRAMUSCULAR | Status: AC
  Administered 2019-01-30: 10 mg via INTRAVENOUS
  Filled 2019-01-30: qty 1

## 2019-01-30 MED ORDER — SODIUM CHLORIDE 0.9 % IV SOLN: 1.0000 g | INTRAVENOUS | Status: DC

## 2019-01-30 MED ORDER — INSULIN ASPART 100 UNIT/ML ~~LOC~~ SOLN
0.0000 [IU] | Freq: Three times a day (TID) | SUBCUTANEOUS | Status: DC
  Administered 2019-01-31 – 2019-02-02 (×4): 1 [IU] via SUBCUTANEOUS
  Administered 2019-02-02 – 2019-02-03 (×2): 2 [IU] via SUBCUTANEOUS
  Administered 2019-02-03: 12:00:00 1 [IU] via SUBCUTANEOUS
  Administered 2019-02-04: 2 [IU] via SUBCUTANEOUS
  Administered 2019-02-04: 14:00:00 3 [IU] via SUBCUTANEOUS
  Administered 2019-02-05: 12:00:00 2 [IU] via SUBCUTANEOUS
  Administered 2019-02-05: 09:00:00 1 [IU] via SUBCUTANEOUS

## 2019-01-30 MED ORDER — SODIUM CHLORIDE 0.9 % IV BOLUS
1000.0000 mL | Freq: Once | INTRAVENOUS | Status: AC
  Administered 2019-01-30: 1000 mL via INTRAVENOUS

## 2019-01-30 MED ORDER — DILTIAZEM HCL ER COATED BEADS 180 MG PO CP24
180.0000 mg | ORAL_CAPSULE | Freq: Every day | ORAL | Status: DC
  Administered 2019-01-30 – 2019-02-05 (×7): 180 mg via ORAL
  Filled 2019-01-30 (×7): qty 1

## 2019-01-30 MED ORDER — PANTOPRAZOLE SODIUM 40 MG PO TBEC
40.0000 mg | DELAYED_RELEASE_TABLET | Freq: Every day | ORAL | Status: DC
  Administered 2019-01-30 – 2019-02-05 (×7): 40 mg via ORAL
  Filled 2019-01-30 (×7): qty 1

## 2019-01-30 MED ORDER — QUETIAPINE FUMARATE 25 MG PO TABS: 25.0000 mg | ORAL_TABLET | Freq: Every day | ORAL | Status: DC

## 2019-01-30 MED ORDER — DOXYCYCLINE HYCLATE 100 MG PO TABS
100.0000 mg | ORAL_TABLET | Freq: Two times a day (BID) | ORAL | Status: DC
  Administered 2019-01-30: 100 mg via ORAL
  Filled 2019-01-30: qty 1

## 2019-01-30 MED ORDER — GUAIFENESIN ER 600 MG PO TB12
600.0000 mg | ORAL_TABLET | Freq: Two times a day (BID) | ORAL | Status: DC
  Administered 2019-01-30 – 2019-02-05 (×11): 600 mg via ORAL
  Filled 2019-01-30 (×11): qty 1

## 2019-01-30 MED ORDER — POTASSIUM CHLORIDE CRYS ER 20 MEQ PO TBCR
20.0000 meq | EXTENDED_RELEASE_TABLET | Freq: Every day | ORAL | Status: DC
  Administered 2019-01-30 – 2019-02-04 (×6): 20 meq via ORAL
  Filled 2019-01-30 (×6): qty 1

## 2019-01-30 MED ORDER — RIVAROXABAN 15 MG PO TABS
15.0000 mg | ORAL_TABLET | Freq: Every day | ORAL | Status: DC
  Administered 2019-01-30 – 2019-02-01 (×3): 15 mg via ORAL
  Filled 2019-01-30 (×3): qty 1

## 2019-01-30 MED ORDER — ACETAMINOPHEN 325 MG PO TABS
650.0000 mg | ORAL_TABLET | Freq: Four times a day (QID) | ORAL | Status: DC | PRN
  Administered 2019-01-31 – 2019-02-04 (×3): 650 mg via ORAL
  Filled 2019-01-30 (×3): qty 2

## 2019-01-30 MED ORDER — IPRATROPIUM-ALBUTEROL 0.5-2.5 (3) MG/3ML IN SOLN
3.0000 mL | Freq: Four times a day (QID) | RESPIRATORY_TRACT | Status: DC
  Administered 2019-01-30 – 2019-02-05 (×23): 3 mL via RESPIRATORY_TRACT
  Filled 2019-01-30 (×19): qty 3

## 2019-01-30 MED ORDER — ONDANSETRON HCL 4 MG/2ML IJ SOLN: 4.0000 mg | Freq: Four times a day (QID) | INTRAMUSCULAR | Status: DC | PRN

## 2019-01-30 MED ORDER — TRAZODONE HCL 50 MG PO TABS: 50.0000 mg | ORAL_TABLET | Freq: Every evening | ORAL | Status: DC | PRN

## 2019-01-30 MED ORDER — SODIUM CHLORIDE 0.9 % IV SOLN
1.0000 g | Freq: Two times a day (BID) | INTRAVENOUS | Status: DC
  Administered 2019-01-30 – 2019-02-05 (×12): 1 g via INTRAVENOUS
  Filled 2019-01-30 (×12): qty 1

## 2019-01-30 NOTE — Consult Note (Signed)
Consultation Note Date: 01/30/2019   Patient Name: Eileen [name]  DOB: February 24, 1946  MRN: [id number]  Age / Sex: 73 y.o., female  PCP: Lavella Lemons, PA Referring Physician: Roxan Hockey, MD  Reason for Consultation: Establishing goals of care  HPI/Patient Profile: 72 y.o. female  with past medical history of dementia, severe COPD, recurrent pneumonia (aspiration?), atrial flutter, HTN, mod mitral regurgitation, mild-mod pulmonary hypertension, iron deficiency anemia, diabetes, diverticulosis, h/o R breast cancer in remission, h/o L1 compression fracture s/p kyphoplasty, 10 admissions over past 6 months mostly d/t COPD and pneumonia admitted on 01/30/2019 with SOB and hypoxia 66% 3-4L at home and somnolent. Requiring BiPAP support.   Clinical Assessment and Goals of Care: [name] is known to me from past admissions. She has frequent hospitalizations r/t end-stage COPD and respiratory failure. She often requires BiPAP and has so far responded very well to BiPAP.   At time of my visit today [name] has just been removed from BiPAP. She continues with labored breathing but tolerating off BiPAP at this time. She is restless and attempting to pull off pulse ox. She is aware of her respiratory issues and hates BiPAP. She clearly tells me "I am ready to go home" and when I ask what she means by home she tells me "my [facility] home." She has expressed her readiness for end of life and acceptance previously to me. She follows this up with telling me that her kids are not ready and not accepting of this. She continues to point me to [place] for ultimate decision making.   All questions/concerns addressed. Emotional support provided.   I called to speak with [facility] but no answer. I left voicemail. [name] has consistently requested full aggressive care and resistant to conversations. [name] was clear  during previous conversation that BiPAP always turns her mother around and they expect this will always be the case.   Primary Decision Maker NEXT OF KIN daughters [name] and Sharyn Lull    SUMMARY OF RECOMMENDATIONS   - Continue full aggressive care - Family did not call me back but I am doubtful that they would set any limits - Consider BiPAP qhs at home  Code Status/Advance Care Planning:  Full code   Symptom Management:   Per attending.   Palliative Prophylaxis:   Aspiration, Delirium Protocol, Oral Care and Turn Reposition  Additional Recommendations (Limitations, Scope, Preferences):  Avoid Hospitalization  Psycho-social/Spiritual:   Desire for further Chaplaincy support:yes  Additional Recommendations: Caregiving  Support/Resources and Education on Hospice  Prognosis:   Overall prognosis poor. < 6 months likely with end stage COPD.   Discharge Planning: To Be Determined      Primary Diagnoses: Present on Admission: . Respiratory failure with hypoxia ([place])   I have reviewed the medical record, interviewed the patient and family, and examined the patient. The following aspects are pertinent.  Past Medical History:  Diagnosis Date  . Asthma   . Atrial flutter ([place])   . Cancer [facility])    Right  breast  . COPD (chronic obstructive pulmonary disease) ([place])   . Dementia ([place])   . Diverticulosis   . Hemorrhoids   . History of breast cancer    right breast  . Hypertension   . On home O2   . Type 2 diabetes mellitus ([place])    Social History   Socioeconomic History  . Marital status: Single    Spouse name: Not on file  . Number of children: Not on file  . Years of education: Not on file  . Highest education level: Not on file  Occupational History  . Not on file  Social Needs  . Financial resource strain: Not hard at all  . Food insecurity    Worry: Never true    Inability: Never true  . Transportation needs    Medical: Patient refused     Non-medical: Patient refused  Tobacco Use  . Smoking status: Former Smoker    Packs/day: 0.50    Years: 32.00    Pack years: 16.00    Types: Cigarettes    Start date: 12/12/1962    Quit date: 03/13/1994    Years since quitting: 24.9  . Smokeless tobacco: Never Used  Substance and Sexual Activity  . Alcohol use: No    Alcohol/week: 0.0 standard drinks  . Drug use: No  . Sexual activity: Not on file  Lifestyle  . Physical activity    Days per week: Patient refused    Minutes per session: Patient refused  . Stress: Not at all  Relationships  . Social Herbalist on phone: Three times a week    Gets together: Three times a week    Attends religious service: More than 4 times per year    Active member of club or organization: Yes    Attends meetings of clubs or organizations: More than 4 times per year    Relationship status: Never married  Other Topics Concern  . Not on file  Social History Narrative  . Not on file   Family History  Problem Relation Age of Onset  . Cancer Mother        lung  . Diabetes Brother    Scheduled Meds: . amiodarone  200 mg Oral Daily  . diltiazem  180 mg Oral Daily  . doxycycline  100 mg Oral Q12H  . furosemide  60 mg Oral Daily  . guaiFENesin  600 mg Oral BID  . ipratropium-albuterol  3 mL Nebulization Q6H  . methylPREDNISolone (SOLU-MEDROL) injection  40 mg Intravenous Q8H  . metoprolol tartrate  25 mg Oral BID  . pantoprazole  40 mg Oral Daily  . potassium chloride SA  20 mEq Oral Daily  . QUEtiapine  25 mg Oral QHS  . Rivaroxaban  15 mg Oral Q supper  . sodium chloride flush  3 mL Intravenous Q12H   Continuous Infusions: . sodium chloride     PRN Meds:.sodium chloride, acetaminophen **OR** acetaminophen, albuterol, ondansetron **OR** ondansetron (ZOFRAN) IV, polyethylene glycol, sodium chloride flush, traZODone Allergies  Allergen Reactions  . Codeine Other (See Comments)    "jittery"   Review of Systems  Unable to  perform ROS: Dementia  Constitutional: Positive for activity change, appetite change and fatigue.  Respiratory: Positive for shortness of breath.   Neurological: Positive for weakness.    Physical Exam Vitals signs and nursing note reviewed.  Constitutional:      Appearance: She is ill-appearing.     Interventions: She is not intubated.  Cardiovascular:     Rate and Rhythm: Normal rate.  Pulmonary:     Effort: Accessory muscle usage present. No tachypnea or retractions. She is not intubated.     Breath sounds: Decreased breath sounds present.     Comments: Increased work of breathing Abdominal:     General: Abdomen is flat.     Palpations: Abdomen is soft.  Neurological:     Mental Status: She is alert. She is confused.     Comments: Restless; oriented to person, place, somewhat to situation     Pain Scale: 0-10   Pain Score: 0-No pain   SpO2: SpO2: 90 % O2 Device:SpO2: 90 % O2 Flow Rate: .O2 Flow Rate (L/min): 6 L/min  IO: Intake/output summary:   Intake/Output Summary (Last 24 hours) at 01/30/2019 1217 Last data filed at 01/30/2019 1002 Gross per 24 hour  Intake 2043.59 ml  Output -  Net 2043.59 ml    LBM:   Baseline Weight: Weight: 66.9 kg Most recent weight: Weight: 64.7 kg     Palliative Assessment/Data:     Time In: 1430 Time Out: 1500 Time Total: 30 min  Greater than 50%  of this time was spent counseling and coordinating care related to the above assessment and plan.  Signed by: Vinie Sill, NP Palliative Medicine Team Pager # [phone] (M-F 8a-5p) Team Phone # [phone] (Nights/Weekends)

## 2019-01-30 NOTE — ED Notes (Signed)
Date and time results received: 01/30/19 0520  Test: pH  Critical Value: 7.188  Name of Provider Notified: [facility]  Orders Received? Or Actions Taken?: na

## 2019-01-30 NOTE — ED Notes (Signed)
Pt placed on purewick 

## 2019-01-30 NOTE — Progress Notes (Signed)
[facility] Stepdown -- Manufacturing engineer RN Note  Palliative referral pending prior to hospital admission.  Palliative team reached out to schedule outpatient community palliative appointment and informed patient returned to hospital. Will continue to follow throughout hospitalization.  Please call for any palliative or hospice needs.  Thank you, Margaretmary Eddy, RN, BSN [facility] Liaison [phone]

## 2019-01-30 NOTE — ED Notes (Signed)
Date and time results received: 01/30/19 0520  Test: CO2 Critical Value: 109  Name of Provider Notified: [facility]  Orders Received? Or Actions Taken?: na

## 2019-01-30 NOTE — ED Triage Notes (Signed)
Family called EMs d/t shortness of breath. Per EMS, O2 sats were 66% on pt's normal O2 L of 3-4.

## 2019-01-30 NOTE — ED Provider Notes (Addendum)
[facility] EMERGENCY DEPARTMENT Provider Note   CSN: [id number] Arrival date & time: 01/30/19  0505     History   Chief Complaint Chief Complaint  Patient presents with  . Shortness of Breath    HPI Eileen [name] is a 73 y.o. female.     HPI  This is a 73 year old female with a history of end-stage COPD on chronic home oxygen, dementia, atrial flutter, hypertension, diabetes who presents with shortness of breath and hypoxia.  Per EMS, patient was found to be short of breath.  O2 sats were 66% on her normal home oxygen of 3 to 4 L.  Patient reports shortness of breath.  She denies any chest pain.  She provides very limited history as she is fairly somnolent.  No noted recent fevers.   Patient had a recent admission at the end of October with similar presentation.  She was hypoxic upon arrival.  Noted to have hypercapnia with a CO2 of 81.  At that time coronavirus testing was negative.  She was found to have pneumonia and was hospitalized for further management.  She was also found to have ESBL UTI and completed a course of antibiotics.  She declined SNF placement and remained full code at discharge.  Level 5 caveat for acuity of condition  Past Medical History:  Diagnosis Date  . Asthma   . Atrial flutter ([place])   . Cancer [facility])    Right breast  . COPD (chronic obstructive pulmonary disease) ([place])   . Dementia ([place])   . Diverticulosis   . Hemorrhoids   . History of breast cancer    right breast  . Hypertension   . On home O2   . Type 2 diabetes mellitus [facility])     Patient Active Problem List   Diagnosis Date Noted  . Acute and chronic respiratory failure with hypercapnia ([place]) 12/26/2018  . On home O2   . CO2 narcosis   . Pulmonary edema   . Elevated brain natriuretic peptide (BNP) level   . Orthostatic hypotension   . Hematochezia 12/06/2018  . Rectal bleeding 12/05/2018  . Aspiration pneumonia ([place]) 11/19/2018  . Dysphagia 11/19/2018  . Acute respiratory  failure with hypoxia and hypercapnia ([place]) 11/16/2018  . Chronic obstructive bronchitis ([place])   . Dementia with behavioral disturbance ([place])   . Goals of care, counseling/discussion   . Advanced care planning/counseling discussion   . Palliative care by specialist   . Bronchiectasis with acute exacerbation ([place])   . COPD exacerbation ([place]) 10/18/2018  . Anemia 10/18/2018  . Osteoporosis 08/30/2018  . Closed compression fracture of body of L1 vertebra ([place])   . Constipation   . Acute and chronic respiratory failure (acute-on-chronic) ([place]) 08/10/2018  . Left lower lobe pneumonia 08/10/2018  . Leukocytosis 08/10/2018  . Chronic respiratory failure with hypoxia ([place]) 06/28/2018  . Acute upper GI bleeding 01/20/2018  . Lower GI bleed 01/20/2018  . Acute respiratory failure with hypoxia ([place]) 09/17/2017  . Diabetes mellitus ([place]) 08/31/2017  . Acute hypoxemic respiratory failure ([place]) 08/31/2017  . Hemorrhoids 10/30/2016  . Atrial fibrillation with RVR ([place]) 09/24/2016  . Atrial flutter ([place]) 09/23/2016  . Obstructive chronic bronchitis with exacerbation ([place]) 05/23/2016  . Vitamin D deficiency 05/23/2016  . Acute on chronic respiratory failure ([place]) 03/24/2015  . Metabolic encephalopathy [id number]  . HCAP (healthcare-associated pneumonia) 03/24/2015  . Anticoagulation management encounter   . New onset atrial flutter ([place]) 03/21/2015  . Obesity 03/21/2015  . Atrial flutter with  rapid ventricular response ([place]) 03/21/2015  . COPD (chronic obstructive pulmonary disease) ([place]) 12/20/2014  . COPD with acute exacerbation ([place]) 12/20/2014  . 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 Breast cancer ([place]) 10/11/2009    Past Surgical History:  Procedure Laterality Date  . BREAST SURGERY  2011   Right breast mastectomy  . CARDIOVERSION N/A 06/27/2018   Procedure: CARDIOVERSION;  Surgeon: Arnoldo Lenis, MD;  Location: [facility]  ENDO SUITE;  Service: Endoscopy;  Laterality: N/A;  . CESAREAN SECTION    . COLONOSCOPY N/A 01/22/2018   Procedure: COLONOSCOPY;  Surgeon: Rogene Houston, MD;  Location: [facility] ENDO SUITE;  Service: Endoscopy;  Laterality: N/A;  . HERNIA REPAIR     RIH  . IR VERTEBROPLASTY LUMBAR BX INC UNI/BIL INC/INJECT/IMAGING  09/05/2018  . MASTECTOMY  2011   right breast  . TEE WITHOUT CARDIOVERSION N/A 06/27/2018   Procedure: TRANSESOPHAGEAL ECHOCARDIOGRAM (TEE) WITH PROPOFOL;  Surgeon: Arnoldo Lenis, MD;  Location: [facility] ENDO SUITE;  Service: Endoscopy;  Laterality: N/A;     OB History    Gravida      Para      Term      Preterm      AB      Living  5     SAB      TAB      Ectopic      Multiple      Live Births               Home Medications    Prior to Admission medications   Medication Sig Start Date End Date Taking? Authorizing Provider  acetaminophen (TYLENOL) 500 MG tablet Take 500 mg by mouth every 8 (eight) hours as needed for mild pain or headache.     Lavella Lemons, PA  albuterol (PROVENTIL) (2.5 MG/3ML) 0.083% nebulizer solution Take 3 mLs (2.5 mg total) by nebulization every 6 (six) hours as needed for wheezing or shortness of breath. 01/02/19   Roxan Hockey, MD  amiodarone (PACERONE) 200 MG tablet Take 1 tablet (200 mg total) by mouth daily. 01/02/19   Roxan Hockey, MD  diltiazem (CARDIZEM CD) 180 MG 24 hr capsule Take 1 capsule (180 mg total) by mouth daily. 01/02/19 02/01/19  Roxan Hockey, MD  furosemide (LASIX) 40 MG tablet Take 1.5 tablets (60 mg total) by mouth daily. 01/02/19   Roxan Hockey, MD  insulin glargine (LANTUS) 100 UNIT/ML injection Inject 0.14 mLs (14 Units total) into the skin at bedtime. 01/02/19   Roxan Hockey, MD  insulin lispro (HUMALOG KWIKPEN) 100 UNIT/ML KwikPen Inject 0-0.09 mLs (0-9 Units total) into the skin 4 (four) times daily -  before meals and at bedtime. insulin aspart (novoLOG) injection 0-9 Units 0-9 Units,  Subcutaneous, 3 times daily with meals, CBG < 70: Implement Hypoglycemia Standing Orders and refer to Hypoglycemia Standing Orders sidebar report  CBG 70 - 120: 0 units CBG 121 - 150: 1 unit CBG 151 - 200: 2 units CBG 201 - 250: 3 units CBG 251 - 300: 5 units CBG 301 - 350: 7 units CBG 351 - 400: 9 units CBG > 400: 01/02/19   Emokpae, Courage, MD  metFORMIN (GLUMETZA) 1000 MG (MOD) 24 hr tablet Take 1 tablet (1,000 mg total) by mouth 2 (two) times daily with a meal. 01/02/19 02/01/19  Roxan Hockey, MD  metoprolol tartrate (LOPRESSOR) 25 MG tablet Take 1 tablet (25 mg total) by mouth  2 (two) times daily. 01/02/19 02/01/19  Roxan Hockey, MD  pantoprazole (PROTONIX) 40 MG tablet Take 1 tablet (40 mg total) by mouth daily. 01/02/19   Roxan Hockey, MD  potassium chloride SA (KLOR-CON) 20 MEQ tablet Take 1 tablet (20 mEq total) by mouth daily. While Taking Lasix 01/02/19   Roxan Hockey, MD  predniSONE (DELTASONE) 20 MG tablet Take 1 tablet (20 mg total) by mouth daily with breakfast. 01/02/19   [place] Brick, Courage, MD  QUEtiapine (SEROQUEL) 25 MG tablet Take 1 tablet (25 mg total) by mouth at bedtime. 01/02/19 02/01/19  Roxan Hockey, MD  rivaroxaban (XARELTO) 20 MG TABS tablet Take 1 tablet (20 mg total) by mouth daily with supper. 01/02/19   Roxan Hockey, MD  traMADol (ULTRAM) 50 MG tablet Take 50 mg by mouth 3 (three) times daily.    [provider]    Family History Family History  Problem Relation Age of Onset  . Cancer Mother        lung  . Diabetes Brother     Social History Social History   Tobacco Use  . Smoking status: Former Smoker    Packs/day: 0.50    Years: 32.00    Pack years: 16.00    Types: Cigarettes    Start date: 12/12/1962    Quit date: 03/13/1994    Years since quitting: 24.9  . Smokeless tobacco: Never Used  Substance Use Topics  . Alcohol use: No    Alcohol/week: 0.0 standard drinks  . Drug use: No     Allergies   Codeine   Review  of Systems Review of Systems  Unable to perform ROS: Acuity of condition  Respiratory: Positive for shortness of breath.      Physical Exam Updated Vital Signs BP 121/86   Pulse 79   Temp 97.6 F (36.4 C) (Oral)   Resp 19   Ht 1.676 m (5\' 6" )   Wt 66.9 kg   SpO2 98%   BMI 23.81 kg/m   Physical Exam Vitals signs and nursing note reviewed.  Constitutional:      Appearance: She is well-developed.     Comments: Ill-appearing, somnolent but arousable and will answer simple questions  HENT:     Head: Normocephalic and atraumatic.     Mouth/Throat:     Mouth: Mucous membranes are dry.  Eyes:     Pupils: Pupils are equal, round, and reactive to light.  Neck:     Musculoskeletal: Neck supple.     Vascular: No JVD.  Cardiovascular:     Rate and Rhythm: Normal rate and regular rhythm.     Heart sounds: Normal heart sounds.  Pulmonary:     Effort: Pulmonary effort is normal. No respiratory distress.     Breath sounds: No wheezing.     Comments: Diminished breath sounds in all lung fields, no significant wheezing noted, pursed lip breathing Abdominal:     General: Bowel sounds are normal.     Palpations: Abdomen is soft.  Musculoskeletal:     Right lower leg: No edema.     Left lower leg: No edema.  Skin:    General: Skin is warm and dry.  Neurological:     Mental Status: She is alert.     Comments: Somnolent but arousable and answers simple questions, disoriented  Psychiatric:     Comments: Unable to assess      ED Treatments / Results  Labs (all labs ordered are listed, but only abnormal results are  displayed) Labs Reviewed  BLOOD GAS, ARTERIAL - Abnormal; Notable for the following components:      Result Value   pH, Arterial 7.188 (*)    pCO2 arterial 109 (*)    pO2, Arterial 57.6 (*)    Bicarbonate 33.1 (*)    Acid-Base Excess 11.5 (*)    All other components within normal limits  CBC WITH DIFFERENTIAL/PLATELET - Abnormal; Notable for the following  components:   RBC 3.83 (*)    Hemoglobin 9.6 (*)    MCH 25.1 (*)    MCHC 26.0 (*)    RDW 15.7 (*)    Platelets 401 (*)    Monocytes Absolute 1.3 (*)    All other components within normal limits  BASIC METABOLIC PANEL - Abnormal; Notable for the following components:   Chloride 89 (*)    Glucose, Bld 134 (*)    BUN 28 (*)    Creatinine, Ser 1.20 (*)    GFR calc non Af Amer 44 (*)    GFR calc Af Amer 52 (*)    Anion gap 19 (*)    All other components within normal limits  BRAIN NATRIURETIC PEPTIDE - Abnormal; Notable for the following components:   B Natriuretic Peptide 439.0 (*)    All other components within normal limits  LACTIC ACID, PLASMA - Abnormal; Notable for the following components:   Lactic Acid, Venous 4.1 (*)    All other components within normal limits  CULTURE, BLOOD (ROUTINE X 2)  CULTURE, BLOOD (ROUTINE X 2)  SARS CORONAVIRUS 2 BY RT PCR (HOSPITAL ORDER, [facility])  URINALYSIS, ROUTINE W REFLEX MICROSCOPIC  LACTIC ACID, PLASMA    EKG EKG Interpretation  Date/Time:  Thursday January 30 2019 05:35:20 EST Ventricular Rate:  80 PR Interval:    QRS Duration: 105 QT Interval:  415 QTC Calculation: 479 R Axis:   82 Text Interpretation: Sinus rhythm Short PR interval Borderline right axis deviation Low voltage, extremity leads Artifact Confirmed by Thayer Jew [phone]) on 01/30/2019 6:01:03 AM   Radiology Dg Chest Portable 1 View  Result Date: 01/30/2019 CLINICAL DATA:  Shortness of breath. EXAM: PORTABLE CHEST 1 VIEW COMPARISON:  12/29/2018, additional priors. Most recent CT 09/27/2018 FINDINGS: Low lung volumes, unchanged from prior. Unchanged cardiomegaly. Unchanged mediastinal contours with bilateral hilar prominence. Streaky bibasilar atelectasis or scarring, stable in the right slightly increased on the left. Chronic bronchial thickening with emphysema. Previous suspected pleural effusions have improved with questionable  residual on the left. No confluent airspace disease. No pneumothorax. IMPRESSION: 1. Unchanged chronic cardiomegaly. Unchanged chronic bronchial thickening and emphysema. 2. Streaky bibasilar opacities likely represent atelectasis or scarring and are unchanged from prior exam. 3. Suspected prior pleural effusions have diminished, possible residual pleural fluid on the left. Electronically Signed   By: Keith Rake M.D.   On: 01/30/2019 06:02    Procedures Procedures (including critical care time)  CRITICAL CARE Performed by: Merryl Hacker   Total critical care time: 50 minutes  Critical care time was exclusive of separately billable procedures and treating other patients.  Critical care was necessary to treat or prevent imminent or life-threatening deterioration.  Critical care was time spent personally by me on the following activities: development of treatment plan with patient and/or surrogate as well as nursing, discussions with consultants, evaluation of patient's response to treatment, examination of patient, obtaining history from patient or surrogate, ordering and performing treatments and interventions, ordering and review of laboratory studies, ordering  and review of radiographic studies, pulse oximetry and re-evaluation of patient's condition.   Medications Ordered in ED Medications  sodium chloride 0.9 % bolus 1,000 mL (has no administration in time range)  vancomycin (VANCOCIN) IVPB 1000 mg/200 mL premix (has no administration in time range)  piperacillin-tazobactam (ZOSYN) IVPB 3.375 g (has no administration in time range)  albuterol (PROVENTIL) (2.5 MG/3ML) 0.083% nebulizer solution 5 mg (5 mg Nebulization Given 01/30/19 0539)  dexamethasone (DECADRON) injection 10 mg (10 mg Intravenous Given 01/30/19 0529)  magnesium sulfate IVPB 2 g 50 mL ( Intravenous Restarted 01/30/19 0645)     Initial Impression / Assessment and Plan / ED Course  I have reviewed the triage  vital signs and the nursing notes.  Pertinent labs & imaging results that were available during my care of the patient were reviewed by me and considered in my medical decision making (see chart for details).  Clinical Course as of Jan 30 724  Thu Jan 30, 2019  0644 On recheck, patient much more comfortable on BiPAP.  Coronavirus testing pending.  She has received magnesium and Decadron.  Unfortunately, delay in lab work.  At this time she is afebrile and normotensive.  Chest x-ray appears improved.  Do not feel she needs empiric antibiotics.   [CH]  [id number] Patient's lactate noted to be 4.1.  She also has some AKI with a creatinine of 1.2 up from her baseline.  Normal saline bolus was ordered.  Again afebrile and not hypotensive.  With what appears to be AN improving chest x-ray, would have lower suspicion for pneumonia and suspect this may be her ongoing chronic COPD and respiratory failure causing her acute illness and lactic acidosis.  However, will cover with empiric antibiotics.  Cultures are pending.  She does not appear to be in septic shock and has remained normotensive.   [CH]    Clinical Course User Index [CH] Aya Geisel, Barbette Hair, MD       Patient presents with shortness of breath.  Hypoxic upon EMS arrival.  Required CPAP on the way in.  She is somnolent but arousable.  Suspect hypercarbia in addition to hypoxia given her history.  Her pulmonary exam is limited but no obvious wheezing in fairly poor air movement.  Patient was given Decadron and magnesium.  She was immediately placed on BiPAP as I feel she is low risk for Covid.  Recent Covid testing -8 days ago.  Portable chest x-ray is slightly improved.  See clinical course above.  Still awaiting lab work.  Anticipate patient will need admission given her respiratory status.  7:25 AM Lab work reviewed.  Patient with mild AKI.  Also with a lactic acidosis of 4.1.  She was covered with empiric antibiotics.  Noted signs or symptoms of  septic shock.  Suspect her lactate may be related to her significant respiratory distress upon arrival.  She continues to be stable on BiPAP.  Awaiting coronavirus testing.  Patient was also given a normal saline bolus.  Final Clinical Impressions(s) / ED Diagnoses   Final diagnoses:  Acute on chronic respiratory failure with hypoxia and hypercapnia (HCC)  AKI (acute kidney injury) ([place])  Lactic acidosis    ED Discharge Orders    None       Erryn Dickison, Barbette Hair, MD 01/30/19 [id number]    Merryl Hacker, MD 01/30/19 [id number]    Merryl Hacker, MD 01/30/19 [phone]

## 2019-01-30 NOTE — H&P (Signed)
Patient Demographics:    Eileen [name], is a 73 y.o. female  MRN: [id number]   DOB - 03/07/1946  Admit Date - 01/30/2019  Outpatient Primary MD for the patient is Lavella Lemons, [place]   Assessment & Plan:    Principal Problem:   Acute and chronic respiratory failure with hypercapnia ([place]) Active Problems:   Metabolic encephalopathy   Atrial fibrillation with RVR (HCC)   COPD exacerbation (HCC)   Respiratory failure with hypoxia (HCC)   Diabetes mellitus ([place])   COPD (chronic obstructive pulmonary disease) (HCC)   Atrial flutter (HCC)   Bronchiectasis with acute exacerbation (HCC)   Dementia with behavioral disturbance ([place])   1-acuteon chronicrespiratory failure with hypercapnia and hypoxia - -Repeat ABG after couple of hours of continuous BiPAP shows pH is up to 7.343 from 7.188, PCO2 is down to 67.4 from 109, and PO2 is up to 102 from 57.6 -No definite pneumonia, suspect COPD exacerbation as the culprit -Continue continuous BiPAP, repeat ABG and adjust settings as indicated -Give bronchodilators, mucolytics and IV Solu-Medrol along with doxycycline -Continue oxygen supplementation -Patient may benefit from NIV/trilogy machine upon discharge home given recurrent hospitalizations for persistent hypercapnia -COVID-19 negative  2)Social/Ethics-discussed with daughter, patient is a full code  3) history of bronchiectasis/end-stage advanced COPD ---treat as above in #1  4)-history of atrial flutter/PAFib--- continue Xarelto for anticoagulation, amiodarone 200 mg daily, metoprolol 25 mg twice daily and Cardizem CD 180 mg daily for rate control, watch for tachycardia with bronchodilators  5)DM2-recent A1c was 7.5 reflecting fair DM control, anticipate worsening hyperglycemia due to steroids - -May due to  lactic acidosis Use Novolog/Humalog Sliding scale insulin with Accu-Cheks/Fingersticks as ordered   6)HFpEF-with history of chronic dCHF, last known EF 60 to 65%, -Hold Lasix as patient is n.p.o. -Appears euvolemic and compensated at this time  7) possible UTI--- treat empirically with IV Rocephin pending cultures--- patient did have recent ESBL UTI    With History of - Reviewed by me  Past Medical History:  Diagnosis Date   Asthma    Atrial flutter ([place])    Cancer ([place])    Right breast   COPD (chronic obstructive pulmonary disease) (HCC)    Dementia ([place])    Diverticulosis    Hemorrhoids    History of breast cancer    right breast   Hypertension    On home O2    Type 2 diabetes mellitus ([place])       Past Surgical History:  Procedure Laterality Date   BREAST SURGERY  2011   Right breast mastectomy   CARDIOVERSION N/A 06/27/2018   Procedure: CARDIOVERSION;  Surgeon: Arnoldo Lenis, MD;  [facility];  Service: Endoscopy;  Laterality: N/A;   CESAREAN SECTION     COLONOSCOPY N/A 01/22/2018   Procedure: COLONOSCOPY;  Surgeon: Rogene Houston, MD;  [facility];  Service: Endoscopy;  Laterality: N/A;   HERNIA REPAIR  RIH   IR VERTEBROPLASTY LUMBAR BX INC UNI/BIL INC/INJECT/IMAGING  09/05/2018   MASTECTOMY  2011   right breast   TEE WITHOUT CARDIOVERSION N/A 06/27/2018   Procedure: TRANSESOPHAGEAL ECHOCARDIOGRAM (TEE) WITH PROPOFOL;  Surgeon: Arnoldo Lenis, MD;  [facility];  Service: Endoscopy;  Laterality: N/A;      Chief Complaint  Patient presents with   Shortness of Breath      HPI:    Eileen [name]  is a 73 y.o. female, with PMHx ofRight breast cancer in remission,.L1 compression fracture s/p vertebroplasty/kyphoplasty, Anemia (iron def), Diverticulosis,Dm2, Aflutter/PAFib Moderate mitral regurgitation , Copd (end-stage/severe), chronic hypoxic respiratory failure on 4L, advanced dementia  with cognitive and behavioral concerns presents to the ED by EMS with significant shortness of breath, altered mentation and hypoxia with O2 sats in the 60s despite being on 4 L of oxygen -in ED-patient is found to have PCO2 of 109 and the pH was 7.18, she was placed on BiPAP -Apparently no fevers, patient had worsening shortness of breath with cough and wheezing PTA -Family reports no chest pains -Patient with recurrent frequent hospitalizations for respiratory distress and hypercapnia  -History is limited as patient is lethargic and respiratory distress requiring continuous BiPAP --Additional history obtained from patient's daughter Tish Frederickson at [phone] Unable to Reach other daughter Ms Eloyse Hibma - (primary caregiver)-[phone]- left message   -Chest x-ray unchanged from prior findings of emphysema -Lactic acid elevated in the setting of severe respiratory acidosis -Repeat ABG after couple of hours of continuous BiPAP shows pH is up to 7.343 from 7.188, PCO2 is down to 67.4 from 109, and PO2 is up to 102 from 57.6 -   Review of systems:    In addition to the HPI above,   A full Review of  Systems was done, all other systems reviewed are negative except as noted above in HPI , .    Social History:  Reviewed by me    Social History   Tobacco Use   Smoking status: Former Smoker    Packs/day: 0.50    Years: 32.00    Pack years: 16.00    Types: Cigarettes    Start date: 12/12/1962    Quit date: 03/13/1994    Years since quitting: 24.9   Smokeless tobacco: Never Used  Substance Use Topics   Alcohol use: No    Alcohol/week: 0.0 standard drinks       Family History :  Reviewed by me    Family History  Problem Relation Age of Onset   Cancer Mother        lung   Diabetes Brother     Home Medications:   Prior to Admission medications   Medication Sig Start Date End Date Taking? Authorizing Provider  acetaminophen (TYLENOL) 500 MG tablet Take 500 mg  by mouth every 8 (eight) hours as needed for mild pain or headache.    Yes Lavella Lemons, PA  albuterol (PROVENTIL) (2.5 MG/3ML) 0.083% nebulizer solution Take 3 mLs (2.5 mg total) by nebulization every 6 (six) hours as needed for wheezing or shortness of breath. 01/02/19  Yes Micheil Klaus, MD  amiodarone (PACERONE) 200 MG tablet Take 1 tablet (200 mg total) by mouth daily. 01/02/19  Yes Reginia Battie, MD  diltiazem (CARDIZEM CD) 180 MG 24 hr capsule Take 1 capsule (180 mg total) by mouth daily. 01/02/19 02/01/19 Yes Cyndra Feinberg, MD  furosemide (LASIX) 40 MG tablet Take 1.5 tablets (60 mg total) by mouth daily.  01/02/19  Yes Roxan Hockey, MD  metFORMIN (GLUMETZA) 1000 MG (MOD) 24 hr tablet Take 1 tablet (1,000 mg total) by mouth 2 (two) times daily with a meal. 01/02/19 02/01/19 Yes Jillana Selph, MD  metoprolol tartrate (LOPRESSOR) 25 MG tablet Take 1 tablet (25 mg total) by mouth 2 (two) times daily. 01/02/19 02/01/19 Yes Edouard Gikas, MD  mirtazapine (REMERON) 7.5 MG tablet Take 7.5 mg by mouth at bedtime. 01/23/19  Yes [provider]  pantoprazole (PROTONIX) 40 MG tablet Take 1 tablet (40 mg total) by mouth daily. 01/02/19  Yes Augusta Mirkin, MD  potassium chloride SA (KLOR-CON) 20 MEQ tablet Take 1 tablet (20 mEq total) by mouth daily. While Taking Lasix 01/02/19  Yes Justis Dupas, MD  QUEtiapine (SEROQUEL) 25 MG tablet Take 1 tablet (25 mg total) by mouth at bedtime. 01/02/19 02/01/19 Yes Yeray Tomas, MD  rivaroxaban (XARELTO) 20 MG TABS tablet Take 1 tablet (20 mg total) by mouth daily with supper. 01/02/19  Yes Roxan Hockey, MD     Allergies:     Allergies  Allergen Reactions   Codeine Other (See Comments)    "jittery"     Physical Exam:   Vitals  Blood pressure (!) 142/96, pulse (!) 118, temperature 98 F (36.7 C), temperature source Oral, resp. rate (!) 24, height 5\' 6"  (1.676 m), weight 64.7 kg, SpO2 (!) 89 %.  Physical  Examination: General appearance -very lethargic with respiratory distress on BIPAP mental status -confused, lethargic Eyes - sclera anicteric Neck - supple, no JVD elevation , Chest -diminished, scattered wheezes Heart - S1 and S2 normal, regular , tachycardic with heart rate around 120 Abdomen - soft, nontender, nondistended,   Neurological -Limited neuro exam due to significant lethargy Extremities - no pedal edema noted, intact peripheral pulses  Skin - warm, dry     Data Review:    CBC Recent Labs  Lab 01/30/19 0648  WBC 9.8  HGB 9.6*  HCT 36.9  PLT 401*  MCV 96.3  MCH 25.1*  MCHC 26.0*  RDW 15.7*  LYMPHSABS 3.2  MONOABS 1.3*  EOSABS 0.2  BASOSABS 0.1   ------------------------------------------------------------------------------------------------------------------  Chemistries  Recent Labs  Lab 01/30/19 0648  NA 140  K 4.7  CL 89*  CO2 32  GLUCOSE 134*  BUN 28*  CREATININE 1.20*  CALCIUM 9.1   ------------------------------------------------------------------------------------------------------------------ estimated creatinine clearance is 38.5 mL/min (A) (by C-G formula based on SCr of 1.2 mg/dL (H)). ------------------------------------------------------------------------------------------------------------------ No results for input(s): TSH, T4TOTAL, T3FREE, THYROIDAB in the last 72 hours.  Invalid input(s): FREET3   Coagulation profile No results for input(s): INR, PROTIME in the last 168 hours. ------------------------------------------------------------------------------------------------------------------- No results for input(s): DDIMER in the last 72 hours. -------------------------------------------------------------------------------------------------------------------  Cardiac Enzymes No results for input(s): CKMB, TROPONINI, MYOGLOBIN in the last 168 hours.  Invalid input(s):  CK ------------------------------------------------------------------------------------------------------------------    Component Value Date/Time   BNP 439.0 (H) 01/30/2019 0648     ---------------------------------------------------------------------------------------------------------------  Urinalysis    Component Value Date/Time   COLORURINE YELLOW 01/30/2019 0845   APPEARANCEUR HAZY (A) 01/30/2019 0845   LABSPEC 1.013 01/30/2019 0845   PHURINE 5.0 01/30/2019 0845   GLUCOSEU NEGATIVE 01/30/2019 0845   HGBUR NEGATIVE 01/30/2019 0845   BILIRUBINUR NEGATIVE 01/30/2019 0845   KETONESUR NEGATIVE 01/30/2019 0845   PROTEINUR 30 (A) 01/30/2019 0845   UROBILINOGEN 0.2 09/09/2009 1010   NITRITE NEGATIVE 01/30/2019 0845   LEUKOCYTESUR MODERATE (A) 01/30/2019 0845    ----------------------------------------------------------------------------------------------------------------   Imaging Results:    Dg Chest  Portable 1 View  Result Date: 01/30/2019 CLINICAL DATA:  Shortness of breath. EXAM: PORTABLE CHEST 1 VIEW COMPARISON:  12/29/2018, additional priors. Most recent CT 09/27/2018 FINDINGS: Low lung volumes, unchanged from prior. Unchanged cardiomegaly. Unchanged mediastinal contours with bilateral hilar prominence. Streaky bibasilar atelectasis or scarring, stable in the right slightly increased on the left. Chronic bronchial thickening with emphysema. Previous suspected pleural effusions have improved with questionable residual on the left. No confluent airspace disease. No pneumothorax. IMPRESSION: 1. Unchanged chronic cardiomegaly. Unchanged chronic bronchial thickening and emphysema. 2. Streaky bibasilar opacities likely represent atelectasis or scarring and are unchanged from prior exam. 3. Suspected prior pleural effusions have diminished, possible residual pleural fluid on the left. Electronically Signed   By: Keith Rake M.D.   On: 01/30/2019 06:02    Radiological Exams on  Admission: Dg Chest Portable 1 View  Result Date: 01/30/2019 CLINICAL DATA:  Shortness of breath. EXAM: PORTABLE CHEST 1 VIEW COMPARISON:  12/29/2018, additional priors. Most recent CT 09/27/2018 FINDINGS: Low lung volumes, unchanged from prior. Unchanged cardiomegaly. Unchanged mediastinal contours with bilateral hilar prominence. Streaky bibasilar atelectasis or scarring, stable in the right slightly increased on the left. Chronic bronchial thickening with emphysema. Previous suspected pleural effusions have improved with questionable residual on the left. No confluent airspace disease. No pneumothorax. IMPRESSION: 1. Unchanged chronic cardiomegaly. Unchanged chronic bronchial thickening and emphysema. 2. Streaky bibasilar opacities likely represent atelectasis or scarring and are unchanged from prior exam. 3. Suspected prior pleural effusions have diminished, possible residual pleural fluid on the left. Electronically Signed   By: Keith Rake M.D.   On: 01/30/2019 06:02   DVT Prophylaxis -SCD /Heparin AM Labs Ordered, also please review Full Orders  Family Communication: Admission, patients condition and plan of care including tests being ordered have been discussed with the patient and daughter Sharyn Lull who indicate understanding and agree with the plan   Code Status - Full Code  Likely DC to  TBD  Condition   Fair  Roxan Hockey M.D on 01/30/2019 at 5:25 PM Go to [email or web address] -  for contact info  [facility] - Office  [phone]

## 2019-01-30 NOTE — ED Notes (Signed)
Date and time results received: 01/30/19 7:14 AM    Test: lactic Critical Value: 4.1  Name of Provider Notified: Dr Roderic Palau  Orders Received? Or Actions Taken?:

## 2019-01-30 NOTE — TOC Initial Note (Signed)
Transition of Care [facility]) - Initial/Assessment Note    Patient Details  Name: Eileen [name] MRN: [id number] Date of Birth: 09/22/1945  [facility] CM/SW Contact:    Shade Flood, LCSW Phone Number: 01/30/2019, 11:48 AM  Clinical Narrative:                  Pt admitted from home. She is high risk for readmission. Pt well known to [facility] staff from multiple previous admissions. [facility] CM had been following but has since signed off. Pt was discharged home with [facility] after her last hospital stay. Message left with Georgina Snell at [facility] requesting return call to determine if they are still active with pt. Awaiting return call.  Anticipating pt will return home with [facility] at dc as pt/family have never wanted SNF rehab due to [place].  TOC will follow.  Expected Discharge Plan: [place] Barriers to Discharge: Continued Medical Work up   Patient Goals and CMS Choice        Expected Discharge Plan and Services Expected Discharge Plan: [place]       Living arrangements for the past 2 months: Single Family Home                                      Prior Living Arrangements/Services Living arrangements for the past 2 months: Single Family Home Lives with:: Adult Children Patient language and need for interpreter reviewed:: Yes Do you feel safe going back to the place where you live?: Yes      Need for Family Participation in Patient Care: Yes (Comment) Care giver support system in place?: Yes (comment) Current home services: DME Criminal Activity/Legal Involvement Pertinent to Current Situation/Hospitalization: No - Comment as needed  Activities of Daily Living Home Assistive Devices/Equipment: None ADL Screening (condition at time of admission) Patient's cognitive ability adequate to safely complete daily activities?: No Is the patient deaf or have difficulty hearing?: No Does the patient have difficulty seeing, even when  wearing glasses/contacts?: No Does the patient have difficulty concentrating, remembering, or making decisions?: Yes Patient able to express need for assistance with ADLs?: Yes Does the patient have difficulty dressing or bathing?: Yes Independently performs ADLs?: Yes (appropriate for developmental age) Does the patient have difficulty walking or climbing stairs?: No Weakness of Legs: Both Weakness of Arms/Hands: Both  Permission Sought/Granted                  Emotional Assessment Appearance:: Appears stated age Attitude/Demeanor/Rapport: Engaged Affect (typically observed): Calm Orientation: : Oriented to Self, Oriented to Place, Oriented to  Time, Oriented to Situation Alcohol / Substance Use: Not Applicable Psych Involvement: No (comment)  Admission diagnosis:  Lactic acidosis [E87.2] AKI (acute kidney injury) ([place]) [N17.9] Acute on chronic respiratory failure with hypoxia and hypercapnia (HCC) [id number], J96.22] Patient Active Problem List   Diagnosis Date Noted  . Respiratory failure with hypoxia ([place]) 01/30/2019  . Acute and chronic respiratory failure with hypercapnia ([place]) 12/26/2018  . On home O2   . CO2 narcosis   . Pulmonary edema   . Elevated brain natriuretic peptide (BNP) level   . Orthostatic hypotension   . Hematochezia 12/06/2018  . Rectal bleeding 12/05/2018  . Aspiration pneumonia ([place]) 11/19/2018  . Dysphagia 11/19/2018  . Acute respiratory failure with hypoxia and hypercapnia ([place]) 11/16/2018  . Chronic obstructive bronchitis ([place])   .  Dementia with behavioral disturbance ([place])   . Goals of care, counseling/discussion   . Advanced care planning/counseling discussion   . Palliative care by specialist   . Bronchiectasis with acute exacerbation ([place])   . COPD exacerbation ([place]) 10/18/2018  . Anemia 10/18/2018  . Osteoporosis 08/30/2018  . Closed compression fracture of body of L1 vertebra ([place])   . Constipation   . Acute and chronic respiratory  failure (acute-on-chronic) ([place]) 08/10/2018  . Left lower lobe pneumonia 08/10/2018  . Leukocytosis 08/10/2018  . Chronic respiratory failure with hypoxia ([place]) 06/28/2018  . Acute upper GI bleeding 01/20/2018  . Lower GI bleed 01/20/2018  . Acute respiratory failure with hypoxia ([place]) 09/17/2017  . Diabetes mellitus ([place]) 08/31/2017  . Acute hypoxemic respiratory failure ([place]) 08/31/2017  . Hemorrhoids 10/30/2016  . Atrial fibrillation with RVR ([place]) 09/24/2016  . Atrial flutter ([place]) 09/23/2016  . Obstructive chronic bronchitis with exacerbation ([place]) 05/23/2016  . Vitamin D deficiency 05/23/2016  . Acute on chronic respiratory failure ([place]) 03/24/2015  . Metabolic encephalopathy [id number]  . HCAP (healthcare-associated pneumonia) 03/24/2015  . Anticoagulation management encounter   . New onset atrial flutter ([place]) 03/21/2015  . Obesity 03/21/2015  . Atrial flutter with rapid ventricular response ([place]) 03/21/2015  . COPD (chronic obstructive pulmonary disease) ([place]) 12/20/2014  . COPD with acute exacerbation ([place]) 12/20/2014  . 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 Breast cancer ([place]) 10/11/2009   PCP:  Lavella Lemons, PA Pharmacy:   [facility], [place] [address] [place] [address] Phone: [phone] Fax: [phone]     Social Determinants of Health (SDOH) Interventions    Readmission Risk Interventions Readmission Risk Prevention Plan 01/30/2019 01/02/2019 12/11/2018  Transportation Screening Complete Complete -  PCP or Specialist Appt within 5-7 Days - - -  Home Care Screening - - -  Medication Review (RN CM) - - -  Medication Review (RN Care Manager) Complete Complete -  PCP or Specialist appointment within 3-5 days of discharge - Not Complete -  [place] or [place] - Complete -  SW Recovery Care/Counseling Consult - Complete -  Palliative Care Screening -  Not Complete -  Comments - - -  [place] - Not Complete Not Applicable  Some recent data might be hidden

## 2019-01-31 LAB — BASIC METABOLIC PANEL
Anion gap: 12 (ref 5–15)
BUN: 25 mg/dL — ABNORMAL HIGH (ref 8–23)
CO2: 39 mmol/L — ABNORMAL HIGH (ref 22–32)
Calcium: 8.7 mg/dL — ABNORMAL LOW (ref 8.9–10.3)
Chloride: 90 mmol/L — ABNORMAL LOW (ref 98–111)
Creatinine, Ser: 0.98 mg/dL (ref 0.44–1.00)
GFR calc Af Amer: >60 mL/min (ref >60)
GFR calc non Af Amer: 57 mL/min — ABNORMAL LOW (ref >60)
Glucose, Bld: 140 mg/dL — ABNORMAL HIGH (ref 70–99)
Potassium: 4.5 mmol/L (ref 3.5–5.1)
Sodium: 141 mmol/L (ref 135–145)

## 2019-01-31 LAB — CBC
HCT: 29.1 % — ABNORMAL LOW (ref 36.0–46.0)
Hemoglobin: 8 g/dL — ABNORMAL LOW (ref 12.0–15.0)
MCH: 25.1 pg — ABNORMAL LOW (ref 26.0–34.0)
MCHC: 27.5 g/dL — ABNORMAL LOW (ref 30.0–36.0)
MCV: 91.2 fL (ref 80.0–100.0)
Platelets: 429 10*3/uL — ABNORMAL HIGH (ref 150–400)
RBC: 3.19 MIL/uL — ABNORMAL LOW (ref 3.87–5.11)
RDW: 16 % — ABNORMAL HIGH (ref 11.5–15.5)
WBC: 7.5 10*3/uL (ref 4.0–10.5)
nRBC: 0 % (ref 0.0–0.2)

## 2019-01-31 LAB — GLUCOSE, CAPILLARY
Glucose-Capillary: 149 mg/dL — ABNORMAL HIGH (ref 70–99)
Glucose-Capillary: 156 mg/dL — ABNORMAL HIGH (ref 70–99)
Glucose-Capillary: 133 mg/dL — ABNORMAL HIGH (ref 70–99)
Glucose-Capillary: 137 mg/dL — ABNORMAL HIGH (ref 70–99)

## 2019-01-31 MED ORDER — TRAZODONE HCL 50 MG PO TABS
50.0000 mg | ORAL_TABLET | Freq: Every day | ORAL | Status: DC
  Administered 2019-01-31 – 2019-02-04 (×4): 50 mg via ORAL
  Filled 2019-01-31 (×4): qty 1

## 2019-01-31 NOTE — Progress Notes (Addendum)
Patient Demographics:    Eileen [name], is a 73 y.o. female, DOB - November 18, 1945, [id number]  Admit date - 01/30/2019   Admitting Physician Norvell Caswell Denton Brick, MD  Outpatient Primary MD for the patient is Lavella Lemons, PA  LOS - 1   Chief Complaint  Patient presents with  . Shortness of Breath        Subjective:    Eileen [name] today has no fevers, no emesis,  No chest pain,   -more awake- No productive cough  Assessment  & Plan :    Principal Problem:   Acute and chronic respiratory failure with hypercapnia (HCC) Active Problems:   Metabolic encephalopathy   Atrial fibrillation with RVR (HCC)   COPD exacerbation (HCC)   Respiratory failure with hypoxia (HCC)   Diabetes mellitus (HCC)   COPD (chronic obstructive pulmonary disease) (HCC)   Atrial flutter (HCC)   Bronchiectasis with acute exacerbation (HCC)   Dementia with behavioral disturbance ([place])  Brief Summary- 73 y.o. female, with PMHx ofRight breast cancer in remission,.L1 compression fracture s/p vertebroplasty/kyphoplasty, Anemia (iron def), Diverticulosis,Dm2, Aflutter/PAFib Moderate mitral regurgitation , Copd (end-stage/severe), chronic hypoxic respiratory failure on 4L, advanced dementia with cognitive and behavioral concerns admitted on 01/30/2019 with significant lethargy due to acute on chronic respiratory failure with severe hypercapnia   A/p 1)Acuteon chronicrespiratory failure with severe hypercapnia and hypoxia -  ABG on admission showed pH   7.188, PCO2  109, and PO2  57.6 -No definite pneumonia, suspect COPD exacerbation as the culprit -Overnight patient did well with NIV--- -Repeat ABG after using NIV overnight with pH of 7.41, PCO2 66, PO2 52 -Please note to the patient has had frequent hospitalizations due to hypercapnic respiratory failure -Admitted for hypercapnia on 11/16/2018, and again on 12/26/2018  and now on 01/30/2019 - NIV/Trilogy Requirement Statement:- Patient has severe/end-stage COPD with chronic hypoxic and hypercapnic respiratory failure.  Despite aggressive treatment and recurrent hospitalization for same- patient continues to exhibit signs of significant hypercapnia associated with chronic respiratory failure secondary to severe/end-stage COPD.  Patient requires the use of NIV both nightly and daytime to help with exacerbation periods.  The use of the NIV will treat patient's high PCO2 levels (please see ABG results on BIPAP and on NIV), and use of NIV can reduce risk of exacerbation in future hospitalizations when used at night and during the day.  Patient will need these advanced settings in conjunction with the current medication regimen and aggressive pulmonary treatment: BiPAP is not an option due to his functional limitations and the severity of the patient's condition.   Failure to have NIV available  could lead to death.   patient had significant episodes of severe lethargy and unresponsiveness due to very very high CO2 levels   - c/n  bronchodilators, mucolytics and IV Solu-Medrol along with doxycycline/meropenem -Continue oxygen supplementation -COVID-19 negative  -Patient apparently had a sleep study on 01/24/2019, results not available  2)Social/Ethics-discussed with daughter, patient is a full code  3) history of bronchiectasis/end-stage advanced COPD ---treat as above in #1  4)-History of Atrial Flutter/PAFib--- continue Xarelto for anticoagulation, amiodarone 200 mg daily, metoprolol 25 mg twice daily and Cardizem CD 180 mg daily for rate control, watch for tachycardia with bronchodilators  5)DM2-recent A1c was  7.5 reflecting fair DM control, anticipate worsening hyperglycemia due to steroids - -May due to lactic acidosis Use Novolog/Humalog Sliding scale insulin with Accu-Cheks/Fingersticks as ordered   6)HFpEF-with history of chronic dCHF, last known EF  60 to 65%, -Hold Lasix as patient is n.p.o. -Appears euvolemic and compensated at this time  7) possible UTI--- treat empirically with IV meropenem pending cultures--- patient did have recent ESBL UTI  Disposition/Need for in-Hospital Stay- patient unable to be discharged at this time due to --severe hypercapnic respiratory failure requiring NIV--- patient may need trilogy/NIV machine prior to discharge  Code Status : Full   Family Communication:    (patient is alert, awake and coherent) -Discussed with patient's daughter Rekeisha Dockett   Disposition Plan  : -Home when we are able to obtain NIV/trilogy machine  Consults  :  na DVT Prophylaxis  :  Xarelto - SCDs   Lab Results  Component Value Date   PLT 429 (H) 01/31/2019    Inpatient Medications  Scheduled Meds: . amiodarone  200 mg Oral Daily  . Chlorhexidine Gluconate Cloth  6 each Topical Daily  . diltiazem  180 mg Oral Daily  . guaiFENesin  600 mg Oral BID  . insulin aspart  0-5 Units Subcutaneous QHS  . insulin aspart  0-6 Units Subcutaneous TID WC  . ipratropium-albuterol  3 mL Nebulization Q6H  . methylPREDNISolone (SOLU-MEDROL) injection  40 mg Intravenous Q8H  . metoprolol tartrate  25 mg Oral BID  . pantoprazole  40 mg Oral Daily  . potassium chloride SA  20 mEq Oral Daily  . Rivaroxaban  15 mg Oral Q supper  . sodium chloride flush  3 mL Intravenous Q12H   Continuous Infusions: . sodium chloride    . meropenem (MERREM) IV 1 g (01/31/19 0519)   PRN Meds:.sodium chloride, acetaminophen **OR** acetaminophen, albuterol, labetalol, ondansetron **OR** ondansetron (ZOFRAN) IV, polyethylene glycol, sodium chloride flush   Anti-infectives (From admission, onward)   Start     Dose/Rate Route Frequency Ordered Stop   01/30/19 1800  cefTRIAXone (ROCEPHIN) 1 g in sodium chloride 0.9 % 100 mL IVPB  Status:  Discontinued     1 g 200 mL/hr over 30 Minutes Intravenous Every 24 hours 01/30/19 1753 01/30/19 1758    01/30/19 1800  meropenem (MERREM) 1 g in sodium chloride 0.9 % 100 mL IVPB     1 g 200 mL/hr over 30 Minutes Intravenous Every 12 hours 01/30/19 1759     01/30/19 1100  doxycycline (VIBRA-TABS) tablet 100 mg  Status:  Discontinued     100 mg Oral Every 12 hours 01/30/19 1057 01/31/19 0754   01/30/19 0730  vancomycin (VANCOCIN) IVPB 1000 mg/200 mL premix     1,000 mg 200 mL/hr over 60 Minutes Intravenous  Once 01/30/19 0721 01/30/19 0911   01/30/19 0730  piperacillin-tazobactam (ZOSYN) IVPB 3.375 g     3.375 g 100 mL/hr over 30 Minutes Intravenous  Once 01/30/19 0721 01/30/19 0834       Objective:   Vitals:   01/31/19 0900 01/31/19 1000 01/31/19 1100 01/31/19 1145  BP: (!) 107/56 95/73 103/62   Pulse: 82 83 74   Resp: 17 (!) 21 (!) 22   Temp:    98.4 F (36.9 C)  TempSrc:    Oral  SpO2: 95% 96% (!) 89%   Weight:      Height:        Wt Readings from Last 3 Encounters:  01/31/19 64.9 kg  01/02/19 66.9  kg  12/11/18 67.2 kg     Intake/Output Summary (Last 24 hours) at 01/31/2019 1157 Last data filed at 01/30/2019 1500 Gross per 24 hour  Intake 30 ml  Output -  Net 30 ml   Physical Exam  Gen:- Awake Alert, in no acute distress, able to speak in sentences HEENT:- [place].AT, No sclera icterus Neck-Supple Neck,No JVD,.  Lungs-diminished bilaterally, few scattered wheezes CV- S1, S2 normal, regular  Abd-  +ve B.Sounds, Abd Soft, No tenderness,    Extremity/Skin:- No  edema, pedal pulses present  Psych-affect is appropriate, oriented x3 Neuro-no new focal deficits, no tremors   Data Review:   Micro Results Recent Results (from the past 240 hour(s))  SARS CORONAVIRUS 2 (TAT 6-24 HRS) Nasopharyngeal Nasopharyngeal Swab     Status: None   Collection Time: 01/22/19  6:59 AM   Specimen: Nasopharyngeal Swab  Result Value Ref Range Status   SARS Coronavirus 2 NEGATIVE NEGATIVE Final    Comment: (NOTE) SARS-CoV-2 target nucleic acids are NOT DETECTED. The SARS-CoV-2 RNA is  generally detectable in upper and lower respiratory specimens during the acute phase of infection. Negative results do not preclude SARS-CoV-2 infection, do not rule out co-infections with other pathogens, and should not be used as the sole basis for treatment or other patient management decisions. Negative results must be combined with clinical observations, patient history, and epidemiological information. The expected result is Negative. Fact Sheet for Patients: SugarRoll.be Fact Sheet for Healthcare Providers: https://www.woods-mathews.com/ This test is not yet approved or cleared by the [place] FDA and  has been authorized for detection and/or diagnosis of SARS-CoV-2 by FDA under an Emergency Use Authorization (EUA). This EUA will remain  in effect (meaning this test can be used) for the duration of the COVID-19 declaration under Section 56 4(b)(1) of the Act, 21 U.S.C. section 360bbb-3(b)(1), unless the authorization is terminated or revoked sooner. Performed at [facility], [place] [address] [place], [place] [address]   SARS Coronavirus 2 by RT PCR (hospital order, performed in [facility] hospital lab) Nasopharyngeal Nasopharyngeal Swab     Status: None   Collection Time: 01/30/19  5:34 AM   Specimen: Nasopharyngeal Swab  Result Value Ref Range Status   SARS Coronavirus 2 NEGATIVE NEGATIVE Final    Comment: (NOTE) If result is NEGATIVE SARS-CoV-2 target nucleic acids are NOT DETECTED. The SARS-CoV-2 RNA is generally detectable in upper and lower  respiratory specimens during the acute phase of infection. The lowest  concentration of SARS-CoV-2 viral copies this assay can detect is 250  copies / mL. A negative result does not preclude SARS-CoV-2 infection  and should not be used as the sole basis for treatment or other  patient management decisions.  A negative result may occur with  improper specimen collection /  handling, submission of specimen other  than nasopharyngeal swab, presence of viral mutation(s) within the  areas targeted by this assay, and inadequate number of viral copies  (<250 copies / mL). A negative result must be combined with clinical  observations, patient history, and epidemiological information. If result is POSITIVE SARS-CoV-2 target nucleic acids are DETECTED. The SARS-CoV-2 RNA is generally detectable in upper and lower  respiratory specimens dur ing the acute phase of infection.  Positive  results are indicative of active infection with SARS-CoV-2.  Clinical  correlation with patient history and other diagnostic information is  necessary to determine patient infection status.  Positive results do  not rule out bacterial infection or  co-infection with other viruses. If result is PRESUMPTIVE POSTIVE SARS-CoV-2 nucleic acids MAY BE PRESENT.   A presumptive positive result was obtained on the submitted specimen  and confirmed on repeat testing.  While 2019 novel coronavirus  (SARS-CoV-2) nucleic acids may be present in the submitted sample  additional confirmatory testing may be necessary for epidemiological  and / or clinical management purposes  to differentiate between  SARS-CoV-2 and other Sarbecovirus currently known to infect humans.  If clinically indicated additional testing with an alternate test  methodology [phone]) is advised. The SARS-CoV-2 RNA is generally  detectable in upper and lower respiratory sp ecimens during the acute  phase of infection. The expected result is Negative. Fact Sheet for Patients:  StrictlyIdeas.no Fact Sheet for Healthcare Providers: BankingDealers.co.za This test is not yet approved or cleared by the [place] FDA and has been authorized for detection and/or diagnosis of SARS-CoV-2 by FDA under an Emergency Use Authorization (EUA).  This EUA will remain in effect (meaning this  test can be used) for the duration of the COVID-19 declaration under Section 564(b)(1) of the Act, 21 U.S.C. section 360bbb-3(b)(1), unless the authorization is terminated or revoked sooner. Performed at [facility], [address], [place], [place] [address]   Blood culture (routine x 2)     Status: None (Preliminary result)   Collection Time: 01/30/19  6:48 AM   Specimen: BLOOD  Result Value Ref Range Status   Specimen Description BLOOD LEFT WRIST  Final   Special Requests   Final    BOTTLES DRAWN AEROBIC AND ANAEROBIC Blood Culture results may not be optimal due to an inadequate volume of blood received in culture bottles   Culture   Final    NO GROWTH 1 DAY Performed at [facility], [address], [place], [place] [address]    Report Status PENDING  Incomplete  Blood culture (routine x 2)     Status: None (Preliminary result)   Collection Time: 01/30/19  6:50 AM   Specimen: BLOOD  Result Value Ref Range Status   Specimen Description BLOOD LEFT WRIST  Final   Special Requests   Final    BOTTLES DRAWN AEROBIC ONLY Blood Culture results may not be optimal due to an inadequate volume of blood received in culture bottles   Culture   Final    NO GROWTH 1 DAY Performed at [facility], [address] [place], [place] [address]    Report Status PENDING  Incomplete  MRSA PCR Screening     Status: None   Collection Time: 01/30/19 11:09 AM   Specimen: Nasal Mucosa; Nasopharyngeal  Result Value Ref Range Status   MRSA by PCR NEGATIVE NEGATIVE Final    Comment:        The GeneXpert MRSA Assay (FDA approved for NASAL specimens only), is one component of a comprehensive MRSA colonization surveillance program. It is not intended to diagnose MRSA infection nor to guide or monitor treatment for MRSA infections. Performed at [facility], [address], [place], [place] [address]     Radiology Reports Dg Chest Portable 1 View  Result Date: 01/30/2019 CLINICAL DATA:  Shortness  of breath. EXAM: PORTABLE CHEST 1 VIEW COMPARISON:  12/29/2018, additional priors. Most recent CT 09/27/2018 FINDINGS: Low lung volumes, unchanged from prior. Unchanged cardiomegaly. Unchanged mediastinal contours with bilateral hilar prominence. Streaky bibasilar atelectasis or scarring, stable in the right slightly increased on the left. Chronic bronchial thickening with emphysema. Previous suspected pleural effusions have improved with questionable  residual on the left. No confluent airspace disease. No pneumothorax. IMPRESSION: 1. Unchanged chronic cardiomegaly. Unchanged chronic bronchial thickening and emphysema. 2. Streaky bibasilar opacities likely represent atelectasis or scarring and are unchanged from prior exam. 3. Suspected prior pleural effusions have diminished, possible residual pleural fluid on the left. Electronically Signed   By: Keith Rake M.D.   On: 01/30/2019 06:02     CBC Recent Labs  Lab 01/30/19 0648 01/31/19 0403  WBC 9.8 7.5  HGB 9.6* 8.0*  HCT 36.9 29.1*  PLT 401* 429*  MCV 96.3 91.2  MCH 25.1* 25.1*  MCHC 26.0* 27.5*  RDW 15.7* 16.0*  LYMPHSABS 3.2  --   MONOABS 1.3*  --   EOSABS 0.2  --   BASOSABS 0.1  --     Chemistries  Recent Labs  Lab 01/30/19 0648 01/31/19 0403  NA 140 141  K 4.7 4.5  CL 89* 90*  CO2 32 39*  GLUCOSE 134* 140*  BUN 28* 25*  CREATININE 1.20* 0.98  CALCIUM 9.1 8.7*   ------------------------------------------------------------------------------------------------------------------ No results for input(s): CHOL, HDL, LDLCALC, TRIG, CHOLHDL, LDLDIRECT in the last 72 hours.  Lab Results  Component Value Date   HGBA1C 7.5 (H) 12/11/2018   ------------------------------------------------------------------------------------------------------------------ No results for input(s): TSH, T4TOTAL, T3FREE, THYROIDAB in the last 72 hours.  Invalid input(s): FREET3  ------------------------------------------------------------------------------------------------------------------ No results for input(s): VITAMINB12, FOLATE, FERRITIN, TIBC, IRON, RETICCTPCT in the last 72 hours.  Coagulation profile No results for input(s): INR, PROTIME in the last 168 hours.  No results for input(s): DDIMER in the last 72 hours.  Cardiac Enzymes No results for input(s): CKMB, TROPONINI, MYOGLOBIN in the last 168 hours.  Invalid input(s): CK ------------------------------------------------------------------------------------------------------------------    Component Value Date/Time   BNP 439.0 (H) 01/30/2019 [id number]     Roxan Hockey M.D on 01/31/2019 at 11:57 AM  Go to [email or web address] - for contact info  [facility] - Office  [phone]

## 2019-01-31 NOTE — Progress Notes (Addendum)
  ABG on admission showed pH   7.188, PCO2  109, and PO2  57.6 -Overnight patient did well with NIV--- -Repeat ABG after using NIV overnight with pH of 7.41, PCO2 66, PO2 52 -Please note to the patient has had frequent hospitalizations due to hypercapnic respiratory failure -Admitted for hypercapnia on 11/16/2018, and again on 12/26/2018 and now on 01/30/2019 - NIV/Trilogy Requirement Statement:- Patient has severe/end-stage COPD with chronic hypoxic and hypercapnic respiratory failure.  Despite aggressive treatment and recurrent hospitalization for same- patient continues to exhibit signs of significant hypercapnia associated with chronic respiratory failure secondary to severe/end-stage COPD.  Patient requires the use of NIV both nightly and daytime to help with exacerbation periods.  The use of the NIV will treat patient's high PCO2 levels (please see ABG results on BIPAP and on NIV), and use of NIV can reduce risk of exacerbation in future hospitalizations when used at night and during the day.  Patient will need these advanced settings in conjunction with the current medication regimen and aggressive pulmonary treatment: BiPAP is not an option due to his functional limitations and the severity of the patient's condition.   Failure to have an NIV available  could lead to death.   patient had significant episodes of severe lethargy and unresponsiveness due to very very high CO2 levels   Roxan Hockey, MD

## 2019-01-31 NOTE — TOC Progression Note (Signed)
Transition of Care [facility]) - Progression Note    Patient Details  Name: [name] MRN: [id number] Date of Birth: 1945/11/05  Transition of Care [facility]) CM/SW Contact  Shade Flood, LCSW Phone Number: 01/31/2019, 12:44 PM  Clinical Narrative:     TOC following. MD indicating pt will need NIV at dc. Documentation provided. Referred to Juliann Pulse C at [place].   Spoke with Tommi Rumps at [facility] and they are not active with pt for [facility] and they will not take her back due to non compliance. There are only a few agencies that serve pt's area and it is unlikely we will be able to find a [place] agency to accept due to insurance and also pt's history with the [facility] agencies available to her.  Covering TOC will continue to follow.  Expected Discharge Plan: [place] Barriers to Discharge: Continued Medical Work up  Expected Discharge Plan and Services Expected Discharge Plan: [place] arrangements for the past 2 months: Single Family Home                 DME Arranged: NIV DME Agency: [employer] Date DME Agency Contacted: 01/31/19 Time DME Agency Contacted: [phone] Representative spoke with at DME Agency: [place]             Social Determinants of Health ([place]) Interventions    Readmission Risk Interventions Readmission Risk Prevention Plan 01/30/2019 01/02/2019 12/11/2018  Transportation Screening Complete Complete -  PCP or Specialist Appt within 5-7 Days - - -  Home Care Screening - - -  Medication Review (RN CM) - - -  Medication Review ([place]) Complete Complete -  PCP or Specialist appointment within 3-5 days of discharge - Not Complete -  [place] or [place] - Complete -  SW Recovery Care/Counseling Consult - Complete -  Palliative Care Screening - Not Complete -  Comments - - -  [place] - Not Complete Not Applicable  Some recent data might be hidden

## 2019-02-01 DIAGNOSIS — I4892 Unspecified atrial flutter: Secondary | ICD-10-CM | POA: Diagnosis not present

## 2019-02-01 DIAGNOSIS — I10 Essential (primary) hypertension: Secondary | ICD-10-CM | POA: Diagnosis not present

## 2019-02-01 DIAGNOSIS — J449 Chronic obstructive pulmonary disease, unspecified: Secondary | ICD-10-CM | POA: Diagnosis not present

## 2019-02-01 DIAGNOSIS — J690 Pneumonitis due to inhalation of food and vomit: Secondary | ICD-10-CM | POA: Diagnosis not present

## 2019-02-01 DIAGNOSIS — N390 Urinary tract infection, site not specified: Secondary | ICD-10-CM | POA: Diagnosis present

## 2019-02-01 DIAGNOSIS — B9629 Other Escherichia coli [E. coli] as the cause of diseases classified elsewhere: Secondary | ICD-10-CM | POA: Diagnosis present

## 2019-02-01 DIAGNOSIS — R1310 Dysphagia, unspecified: Secondary | ICD-10-CM | POA: Diagnosis not present

## 2019-02-01 LAB — GLUCOSE, CAPILLARY
Glucose-Capillary: 150 mg/dL — ABNORMAL HIGH (ref 70–99)
Glucose-Capillary: 154 mg/dL — ABNORMAL HIGH (ref 70–99)
Glucose-Capillary: 159 mg/dL — ABNORMAL HIGH (ref 70–99)
Glucose-Capillary: 194 mg/dL — ABNORMAL HIGH (ref 70–99)
Glucose-Capillary: 217 mg/dL — ABNORMAL HIGH (ref 70–99)

## 2019-02-01 LAB — URINE CULTURE
Culture: 70000 — AB
Special Requests: NORMAL

## 2019-02-01 MED ORDER — HALOPERIDOL LACTATE 5 MG/ML IJ SOLN
1.0000 mg | Freq: Once | INTRAMUSCULAR | Status: AC
  Administered 2019-02-01: 23:00:00 1 mg via INTRAVENOUS
  Filled 2019-02-01: qty 1

## 2019-02-01 MED ORDER — METHYLPREDNISOLONE SODIUM SUCC 40 MG IJ SOLR
40.0000 mg | Freq: Two times a day (BID) | INTRAMUSCULAR | Status: DC
  Administered 2019-02-01 – 2019-02-05 (×8): 40 mg via INTRAVENOUS
  Filled 2019-02-01 (×8): qty 1

## 2019-02-01 NOTE — Progress Notes (Signed)
Patient has refused BiPAP tonight., Bipap on stand by

## 2019-02-01 NOTE — Progress Notes (Signed)
Patient Demographics:    Eileen [name], is a 73 y.o. female, DOB - April 06, 1945, [id number]  Admit date - 01/30/2019   Admitting Physician Kaio Kuhlman Denton Brick, MD  Outpatient Primary MD for the patient is Lavella Lemons, PA  LOS - 2   Chief Complaint  Patient presents with  . Shortness of Breath        Subjective:    Eileen Gonzaga today has no fevers, no emesis,  No chest pain,   -More sleepy this morning because she initially did not use her NIV machine -Apparently later agreed to use NIV machine for the latter part of the night -A bit more groggy this a.m.  Assessment  & Plan :    Principal Problem:   Acute and chronic respiratory failure with hypercapnia (HCC) Active Problems:   Metabolic encephalopathy   Atrial fibrillation with RVR (HCC)   COPD exacerbation (HCC)   Respiratory failure with hypoxia (HCC)   UTI due to extended-spectrum beta lactamase (ESBL) producing Escherichia coli   Diabetes mellitus (HCC)   COPD (chronic obstructive pulmonary disease) (HCC)   Atrial flutter (HCC)   Bronchiectasis with acute exacerbation (HCC)   Dementia with behavioral disturbance (HCC)  Brief Summary- 73 y.o. female, with PMHx ofRight breast cancer in remission,.L1 compression fracture s/p vertebroplasty/kyphoplasty, Anemia (iron def), Diverticulosis,Dm2, Aflutter/PAFib Moderate mitral regurgitation , Copd (end-stage/severe), chronic hypoxic respiratory failure on 4L, advanced dementia with cognitive and behavioral concerns admitted on 01/30/2019 with significant lethargy due to acute on chronic respiratory failure with severe hypercapnia   A/p 1)Acuteon chronicrespiratory failure with severe hypercapnia and hypoxia -  ABG on admission showed pH   7.188, PCO2  109, and PO2  57.6 -No definite pneumonia, suspect COPD exacerbation as the culprit --ABG and mental status improved typically after  using NIV machine -Please note that the patient has had frequent hospitalizations due to hypercapnic respiratory failure -Admitted for hypercapnia on 11/16/2018, and again on 12/26/2018 and now on 01/30/2019 - NIV/Trilogy Requirement Statement:- Patient has severe/end-stage COPD with chronic hypoxic and hypercapnic respiratory failure.  Despite aggressive treatment and recurrent hospitalization for same- patient continues to exhibit signs of significant hypercapnia associated with chronic respiratory failure secondary to severe/end-stage COPD.  Patient requires the use of NIV both nightly and daytime to help with exacerbation periods.  The use of the NIV will treat patient's high PCO2 levels (please see ABG results on BIPAP and on NIV), and use of NIV can reduce risk of exacerbation in future hospitalizations when used at night and during the day.  Patient will need these advanced settings in conjunction with the current medication regimen and aggressive pulmonary treatment: BiPAP is not an option due to his functional limitations and the severity of the patient's condition.   Failure to have NIV available  could lead to death.   patient had significant episodes of severe lethargy and unresponsiveness due to very very high CO2 levels   - c/n  bronchodilators, mucolytics and c/n IV Solu-Medrol along with doxycycline/meropenem -Continue oxygen supplementation -COVID-19 negative  -Patient apparently had a sleep study on 01/24/2019, results not available  2)Social/Ethics-discussed with patient and daughter, patient is a full code  3) history of bronchiectasis/end-stage advanced COPD ---treat as above in #1  4)-History of  Atrial Flutter/PAFib--- continue Xarelto for anticoagulation, amiodarone 200 mg daily, metoprolol 25 mg twice daily and Cardizem CD 180 mg daily for rate control, watch for tachycardia with bronchodilators  5)DM2-recent A1c was 7.5 reflecting fair DM control, anticipate  worsening hyperglycemia due to steroids - -hold Metformin due to lactic acidosis Use Novolog/Humalog Sliding scale insulin with Accu-Cheks/Fingersticks as ordered   6)HFpEF-with history of chronic dCHF, last known EF 60 to 65%, -Hold Lasix as patient is n.p.o. -Appears euvolemic and compensated at this time  7) ESBL E. coli UTI---  continue IV meropenem based on urine culture results from 01/30/2019-- plan to discharge on fosfomycin x1 dose on day of discharge, patient appears to have recurrent E. coli ESBL infections  Disposition/Need for in-Hospital Stay- patient unable to be discharged at this time due to --severe hypercapnic respiratory failure requiring NIV--- patient may need trilogy/NIV machine prior to discharge -Also requires IV meropenem for recurrent ESBL UTI  Code Status : Full   Family Communication:    (patient is alert, awake and coherent) -Discussed with patient's daughter Jimena Vasseur and Tish Frederickson  Disposition Plan  : -Home when we are able to obtain NIV/trilogy machine  Consults  :  na DVT Prophylaxis  :  Xarelto - SCDs   Lab Results  Component Value Date   PLT 429 (H) 01/31/2019    Inpatient Medications  Scheduled Meds: . amiodarone  200 mg Oral Daily  . Chlorhexidine Gluconate Cloth  6 each Topical Daily  . diltiazem  180 mg Oral Daily  . guaiFENesin  600 mg Oral BID  . insulin aspart  0-5 Units Subcutaneous QHS  . insulin aspart  0-6 Units Subcutaneous TID WC  . ipratropium-albuterol  3 mL Nebulization Q6H  . methylPREDNISolone (SOLU-MEDROL) injection  40 mg Intravenous Q8H  . metoprolol tartrate  25 mg Oral BID  . pantoprazole  40 mg Oral Daily  . potassium chloride SA  20 mEq Oral Daily  . Rivaroxaban  15 mg Oral Q supper  . sodium chloride flush  3 mL Intravenous Q12H  . traZODone  50 mg Oral QHS   Continuous Infusions: . sodium chloride    . meropenem (MERREM) IV Stopped (02/01/19 0547)   PRN Meds:.sodium chloride, acetaminophen  **OR** acetaminophen, albuterol, labetalol, ondansetron **OR** ondansetron (ZOFRAN) IV, polyethylene glycol, sodium chloride flush   Anti-infectives (From admission, onward)   Start     Dose/Rate Route Frequency Ordered Stop   01/30/19 1800  cefTRIAXone (ROCEPHIN) 1 g in sodium chloride 0.9 % 100 mL IVPB  Status:  Discontinued     1 g 200 mL/hr over 30 Minutes Intravenous Every 24 hours 01/30/19 1753 01/30/19 1758   01/30/19 1800  meropenem (MERREM) 1 g in sodium chloride 0.9 % 100 mL IVPB     1 g 200 mL/hr over 30 Minutes Intravenous Every 12 hours 01/30/19 1759     01/30/19 1100  doxycycline (VIBRA-TABS) tablet 100 mg  Status:  Discontinued     100 mg Oral Every 12 hours 01/30/19 1057 01/31/19 0754   01/30/19 0730  vancomycin (VANCOCIN) IVPB 1000 mg/200 mL premix     1,000 mg 200 mL/hr over 60 Minutes Intravenous  Once 01/30/19 0721 01/30/19 0911   01/30/19 0730  piperacillin-tazobactam (ZOSYN) IVPB 3.375 g     3.375 g 100 mL/hr over 30 Minutes Intravenous  Once 01/30/19 0721 01/30/19 0834       Objective:   Vitals:   02/01/19 0751 02/01/19 1008 02/01/19 1047 02/01/19 1147  BP:  108/65    Pulse:  86    Resp:  19    Temp: 98 F (36.7 C)   97.9 F (36.6 C)  TempSrc: Axillary   Oral  SpO2:   91%   Weight:      Height:        Wt Readings from Last 3 Encounters:  02/01/19 63.6 kg  01/02/19 66.9 kg  12/11/18 67.2 kg     Intake/Output Summary (Last 24 hours) at 02/01/2019 1206 Last data filed at 02/01/2019 0600 Gross per 24 hour  Intake 305.2 ml  Output 2 ml  Net 303.2 ml   Physical Exam  Gen:- Awake Alert, in no acute distress, able to speak in sentences HEENT:- Pillow.AT, No sclera icterus Nose- [place] 3 L/min Neck-Supple Neck,No JVD,.  Lungs-diminished bilaterally, few scattered wheezes CV- S1, S2 normal, regular  Abd-  +ve B.Sounds, Abd Soft, No tenderness,    Extremity/Skin:- No  edema, pedal pulses present  Psych-affect is appropriate, oriented x3 Neuro-no new  focal deficits, no tremors   Data Review:   Micro Results Recent Results (from the past 240 hour(s))  SARS Coronavirus 2 by RT PCR (hospital order, performed in [facility] hospital lab) Nasopharyngeal Nasopharyngeal Swab     Status: None   Collection Time: 01/30/19  5:34 AM   Specimen: Nasopharyngeal Swab  Result Value Ref Range Status   SARS Coronavirus 2 NEGATIVE NEGATIVE Final    Comment: (NOTE) If result is NEGATIVE SARS-CoV-2 target nucleic acids are NOT DETECTED. The SARS-CoV-2 RNA is generally detectable in upper and lower  respiratory specimens during the acute phase of infection. The lowest  concentration of SARS-CoV-2 viral copies this assay can detect is 250  copies / mL. A negative result does not preclude SARS-CoV-2 infection  and should not be used as the sole basis for treatment or other  patient management decisions.  A negative result may occur with  improper specimen collection / handling, submission of specimen other  than nasopharyngeal swab, presence of viral mutation(s) within the  areas targeted by this assay, and inadequate number of viral copies  (<250 copies / mL). A negative result must be combined with clinical  observations, patient history, and epidemiological information. If result is POSITIVE SARS-CoV-2 target nucleic acids are DETECTED. The SARS-CoV-2 RNA is generally detectable in upper and lower  respiratory specimens dur ing the acute phase of infection.  Positive  results are indicative of active infection with SARS-CoV-2.  Clinical  correlation with patient history and other diagnostic information is  necessary to determine patient infection status.  Positive results do  not rule out bacterial infection or co-infection with other viruses. If result is PRESUMPTIVE POSTIVE SARS-CoV-2 nucleic acids MAY BE PRESENT.   A presumptive positive result was obtained on the submitted specimen  and confirmed on repeat testing.  While 2019 novel  coronavirus  (SARS-CoV-2) nucleic acids may be present in the submitted sample  additional confirmatory testing may be necessary for epidemiological  and / or clinical management purposes  to differentiate between  SARS-CoV-2 and other Sarbecovirus currently known to infect humans.  If clinically indicated additional testing with an alternate test  methodology [phone]) is advised. The SARS-CoV-2 RNA is generally  detectable in upper and lower respiratory sp ecimens during the acute  phase of infection. The expected result is Negative. Fact Sheet for Patients:  StrictlyIdeas.no Fact Sheet for Healthcare Providers: BankingDealers.co.za This test is not yet approved or cleared by the [place]  FDA and has been authorized for detection and/or diagnosis of SARS-CoV-2 by FDA under an Emergency Use Authorization (EUA).  This EUA will remain in effect (meaning this test can be used) for the duration of the COVID-19 declaration under Section 564(b)(1) of the Act, 21 U.S.C. section 360bbb-3(b)(1), unless the authorization is terminated or revoked sooner. Performed at [facility], [address] [place], [place] [id number]   Blood culture (routine x 2)     Status: None (Preliminary result)   Collection Time: 01/30/19  6:48 AM   Specimen: BLOOD  Result Value Ref Range Status   Specimen Description BLOOD LEFT WRIST  Final   Special Requests   Final    BOTTLES DRAWN AEROBIC AND ANAEROBIC Blood Culture results may not be optimal due to an inadequate volume of blood received in culture bottles   Culture   Final    NO GROWTH 2 DAYS Performed at [facility], [address], [place], [place] [address]    Report Status PENDING  Incomplete  Blood culture (routine x 2)     Status: None (Preliminary result)   Collection Time: 01/30/19  6:50 AM   Specimen: BLOOD  Result Value Ref Range Status   Specimen Description BLOOD LEFT WRIST  Final   Special  Requests   Final    BOTTLES DRAWN AEROBIC ONLY Blood Culture results may not be optimal due to an inadequate volume of blood received in culture bottles   Culture   Final    NO GROWTH 2 DAYS Performed at [facility], [address], [place], [place] [address]    Report Status PENDING  Incomplete  Culture, Urine     Status: Abnormal   Collection Time: 01/30/19  8:45 AM   Specimen: Urine, Clean Catch  Result Value Ref Range Status   Specimen Description   Final    URINE, CLEAN CATCH Performed at [facility], [address], [place], [place] [address]    Special Requests   Final    Normal Performed at [facility], [address], [place], [place] [address]    Culture (A)  Final    70,000 COLONIES/mL ESCHERICHIA COLI Confirmed Extended Spectrum Beta-Lactamase Producer (ESBL).  In bloodstream infections from ESBL organisms, carbapenems are preferred over piperacillin/tazobactam. They are shown to have a lower risk of mortality.    Report Status 02/01/2019 FINAL  Final   Organism ID, Bacteria ESCHERICHIA COLI (A)  Final      Susceptibility   Escherichia coli - MIC*    AMPICILLIN >=32 RESISTANT Resistant     CEFAZOLIN >=64 RESISTANT Resistant     CEFTRIAXONE >=64 RESISTANT Resistant     CIPROFLOXACIN >=4 RESISTANT Resistant     GENTAMICIN <=1 SENSITIVE Sensitive     IMIPENEM <=0.25 SENSITIVE Sensitive     NITROFURANTOIN <=16 SENSITIVE Sensitive     TRIMETH/SULFA <=20 SENSITIVE Sensitive     AMPICILLIN/SULBACTAM 4 SENSITIVE Sensitive     PIP/TAZO <=4 SENSITIVE Sensitive     Extended ESBL POSITIVE Resistant     * 70,000 COLONIES/mL ESCHERICHIA COLI  MRSA PCR Screening     Status: None   Collection Time: 01/30/19 11:09 AM   Specimen: Nasal Mucosa; Nasopharyngeal  Result Value Ref Range Status   MRSA by PCR NEGATIVE NEGATIVE Final    Comment:        The GeneXpert MRSA Assay (FDA approved for NASAL specimens only), is one component of a comprehensive MRSA colonization  surveillance program. It is not intended to  diagnose MRSA infection nor to guide or monitor treatment for MRSA infections. Performed at [facility], [address], [place], [place] [address]     Radiology Reports Dg Chest Portable 1 View  Result Date: 01/30/2019 CLINICAL DATA:  Shortness of breath. EXAM: PORTABLE CHEST 1 VIEW COMPARISON:  12/29/2018, additional priors. Most recent CT 09/27/2018 FINDINGS: Low lung volumes, unchanged from prior. Unchanged cardiomegaly. Unchanged mediastinal contours with bilateral hilar prominence. Streaky bibasilar atelectasis or scarring, stable in the right slightly increased on the left. Chronic bronchial thickening with emphysema. Previous suspected pleural effusions have improved with questionable residual on the left. No confluent airspace disease. No pneumothorax. IMPRESSION: 1. Unchanged chronic cardiomegaly. Unchanged chronic bronchial thickening and emphysema. 2. Streaky bibasilar opacities likely represent atelectasis or scarring and are unchanged from prior exam. 3. Suspected prior pleural effusions have diminished, possible residual pleural fluid on the left. Electronically Signed   By: Keith Rake M.D.   On: 01/30/2019 06:02     CBC Recent Labs  Lab 01/30/19 0648 01/31/19 0403  WBC 9.8 7.5  HGB 9.6* 8.0*  HCT 36.9 29.1*  PLT 401* 429*  MCV 96.3 91.2  MCH 25.1* 25.1*  MCHC 26.0* 27.5*  RDW 15.7* 16.0*  LYMPHSABS 3.2  --   MONOABS 1.3*  --   EOSABS 0.2  --   BASOSABS 0.1  --     Chemistries  Recent Labs  Lab 01/30/19 0648 01/31/19 0403  NA 140 141  K 4.7 4.5  CL 89* 90*  CO2 32 39*  GLUCOSE 134* 140*  BUN 28* 25*  CREATININE 1.20* 0.98  CALCIUM 9.1 8.7*   ------------------------------------------------------------------------------------------------------------------ No results for input(s): CHOL, HDL, LDLCALC, TRIG, CHOLHDL, LDLDIRECT in the last 72 hours.  Lab Results  Component Value Date   HGBA1C 7.5 (H)  12/11/2018   ------------------------------------------------------------------------------------------------------------------ No results for input(s): TSH, T4TOTAL, T3FREE, THYROIDAB in the last 72 hours.  Invalid input(s): FREET3 ------------------------------------------------------------------------------------------------------------------ No results for input(s): VITAMINB12, FOLATE, FERRITIN, TIBC, IRON, RETICCTPCT in the last 72 hours.  Coagulation profile No results for input(s): INR, PROTIME in the last 168 hours.  No results for input(s): DDIMER in the last 72 hours.  Cardiac Enzymes No results for input(s): CKMB, TROPONINI, MYOGLOBIN in the last 168 hours.  Invalid input(s): CK ------------------------------------------------------------------------------------------------------------------    Component Value Date/Time   BNP 439.0 (H) 01/30/2019 [id number]    Roxan Hockey M.D on 02/01/2019 at 12:06 PM  Go to [email or web address] - for contact info  [facility] - Office  [phone]

## 2019-02-01 NOTE — Progress Notes (Addendum)
Patient is confused and upset about , unsure of events. I will place BiPAP.when possible. She has terrible dementia with seeing people in rooms while talking to therapist who she recognizes. She is usually very pleasant but can be very addiment about what she thinks is real. BiPAP in room is V60 and has rate similar to trilogy Non invasive vent as requested. She is receiving some haldol.

## 2019-02-01 NOTE — Progress Notes (Signed)
Dr. Darrell Jewel notified of patient's increasing agitation and paranoia as well as hallucinations.

## 2019-02-02 LAB — BASIC METABOLIC PANEL
Anion gap: 13 (ref 5–15)
BUN: 30 mg/dL — ABNORMAL HIGH (ref 8–23)
CO2: 35 mmol/L — ABNORMAL HIGH (ref 22–32)
Calcium: 9 mg/dL (ref 8.9–10.3)
Chloride: 93 mmol/L — ABNORMAL LOW (ref 98–111)
Creatinine, Ser: 0.88 mg/dL (ref 0.44–1.00)
GFR calc Af Amer: >60 mL/min (ref >60)
GFR calc non Af Amer: >60 mL/min (ref >60)
Glucose, Bld: 243 mg/dL — ABNORMAL HIGH (ref 70–99)
Potassium: 5 mmol/L (ref 3.5–5.1)
Sodium: 141 mmol/L (ref 135–145)

## 2019-02-02 LAB — GLUCOSE, CAPILLARY
Glucose-Capillary: 200 mg/dL — ABNORMAL HIGH (ref 70–99)
Glucose-Capillary: 142 mg/dL — ABNORMAL HIGH (ref 70–99)
Glucose-Capillary: 211 mg/dL — ABNORMAL HIGH (ref 70–99)
Glucose-Capillary: 128 mg/dL — ABNORMAL HIGH (ref 70–99)

## 2019-02-02 LAB — CBC
HCT: 32.9 % — ABNORMAL LOW (ref 36.0–46.0)
Hemoglobin: 8.8 g/dL — ABNORMAL LOW (ref 12.0–15.0)
MCH: 24.9 pg — ABNORMAL LOW (ref 26.0–34.0)
MCHC: 26.7 g/dL — ABNORMAL LOW (ref 30.0–36.0)
MCV: 92.9 fL (ref 80.0–100.0)
Platelets: 411 10*3/uL — ABNORMAL HIGH (ref 150–400)
RBC: 3.54 MIL/uL — ABNORMAL LOW (ref 3.87–5.11)
RDW: 16 % — ABNORMAL HIGH (ref 11.5–15.5)
WBC: 9.8 10*3/uL (ref 4.0–10.5)
nRBC: 0 % (ref 0.0–0.2)

## 2019-02-02 MED ORDER — HALOPERIDOL LACTATE 5 MG/ML IJ SOLN
5.0000 mg | Freq: Four times a day (QID) | INTRAMUSCULAR | Status: DC | PRN
  Filled 2019-02-02: qty 1

## 2019-02-02 MED ORDER — RIVAROXABAN 20 MG PO TABS
20.0000 mg | ORAL_TABLET | Freq: Every day | ORAL | Status: DC
  Administered 2019-02-03 – 2019-02-04 (×2): 20 mg via ORAL
  Filled 2019-02-02 (×2): qty 1

## 2019-02-02 MED ORDER — HALOPERIDOL LACTATE 5 MG/ML IJ SOLN
10.0000 mg | Freq: Once | INTRAMUSCULAR | Status: AC
  Administered 2019-02-02: 18:00:00 10 mg via INTRAMUSCULAR
  Filled 2019-02-02: qty 2

## 2019-02-02 MED ORDER — LORAZEPAM 2 MG/ML IJ SOLN
0.5000 mg | Freq: Once | INTRAMUSCULAR | Status: AC
  Administered 2019-02-02: 17:00:00 0.5 mg via INTRAVENOUS
  Filled 2019-02-02: qty 1

## 2019-02-02 MED ORDER — ALPRAZOLAM 0.5 MG PO TABS
0.5000 mg | ORAL_TABLET | Freq: Every evening | ORAL | Status: DC | PRN
  Administered 2019-02-03: 0.5 mg via ORAL
  Filled 2019-02-02: qty 1

## 2019-02-02 NOTE — Procedures (Signed)
[place] A. Merlene Laughter, MD     [email or web address]             NOCTURNAL POLYSOMNOGRAPHY   LOCATION: [facility]  Patient Name: C[place]iver, [place] Study Date: 01/24/2019 Gender: Female D.O.B: 02-27-1946 Age (years): 73 Referring Provider: Sinda Du Height (inches): 67 Interpreting Physician: Phillips Odor MD, ABSM Weight (lbs): 147 RPSGT: Peak, Robert BMI: 23 MRN: [id number] Neck Size: 13.00 CLINICAL INFORMATION  Sleep Study Type: NPSG     Indication for sleep study: Snoring     Epworth Sleepiness Score: NA    SLEEP STUDY TECHNIQUE  As per the AASM Manual for the Scoring of Sleep and Associated Events v2.3 (April 2016) with a hypopnea requiring 4% desaturations.  The channels recorded and monitored were frontal, central and occipital EEG, electrooculogram (EOG), submentalis EMG (chin), nasal and oral airflow, thoracic and abdominal wall motion, anterior tibialis EMG, snore microphone, electrocardiogram, and pulse oximetry.  MEDICATIONS Medications self-administered by patient taken the night of the study : N/A No current facility-administered medications for this visit.  No current outpatient medications on file.  Facility-Administered Medications Ordered in Other Visits:  .  0.9 %  sodium chloride infusion, 250 mL, Intravenous, PRN, Denton Brick, Courage, MD .  acetaminophen (TYLENOL) tablet 650 mg, 650 mg, Oral, Q6H PRN, 650 mg at 01/31/19 2140 **OR** acetaminophen (TYLENOL) suppository 650 mg, 650 mg, Rectal, Q6H PRN, Emokpae, Courage, MD .  albuterol (PROVENTIL) (2.5 MG/3ML) 0.083% nebulizer solution 2.5 mg, 2.5 mg, Nebulization, Q2H PRN, Emokpae, Courage, MD .  amiodarone (PACERONE) tablet 200 mg, 200 mg, Oral, Daily, Emokpae, Courage, MD, 200 mg at 02/02/19 [id number] .  Chlorhexidine Gluconate Cloth 2 % PADS 6 each, 6 each, Topical, Daily, Roxan Hockey, MD, 6 each at 02/02/19 [phone] .  diltiazem (CARDIZEM CD) 24 hr capsule 180 mg, 180 mg, Oral,  Daily, Emokpae, Courage, MD, 180 mg at 02/02/19 0811 .  guaiFENesin (MUCINEX) 12 hr tablet 600 mg, 600 mg, Oral, BID, Emokpae, Courage, MD, 600 mg at 02/02/19 0811 .  insulin aspart (novoLOG) injection 0-5 Units, 0-5 Units, Subcutaneous, QHS, Emokpae, Courage, MD, 2 Units at 02/01/19 2111 .  insulin aspart (novoLOG) injection 0-6 Units, 0-6 Units, Subcutaneous, TID WC, Emokpae, Courage, MD, 1 Units at 02/02/19 0811 .  ipratropium-albuterol (DUONEB) 0.5-2.5 (3) MG/3ML nebulizer solution 3 mL, 3 mL, Nebulization, Q6H, Emokpae, Courage, MD, 3 mL at 02/02/19 0814 .  labetalol (NORMODYNE) injection 10 mg, 10 mg, Intravenous, Q4H PRN, Emokpae, Courage, MD .  meropenem (MERREM) 1 g in sodium chloride 0.9 % 100 mL IVPB, 1 g, Intravenous, Q12H, Emokpae, Courage, MD, Last Rate: 200 mL/hr at 02/02/19 0510, 1 g at 02/02/19 0510 .  methylPREDNISolone sodium succinate (SOLU-MEDROL) 40 mg/mL injection 40 mg, 40 mg, Intravenous, Q12H, Emokpae, Courage, MD, 40 mg at 02/02/19 1153 .  metoprolol tartrate (LOPRESSOR) tablet 25 mg, 25 mg, Oral, BID, Emokpae, Courage, MD, 25 mg at 02/02/19 0812 .  ondansetron (ZOFRAN) tablet 4 mg, 4 mg, Oral, Q6H PRN **OR** ondansetron (ZOFRAN) injection 4 mg, 4 mg, Intravenous, Q6H PRN, Emokpae, Courage, MD .  pantoprazole (PROTONIX) EC tablet 40 mg, 40 mg, Oral, Daily, Emokpae, Courage, MD, 40 mg at 02/02/19 0811 .  polyethylene glycol (MIRALAX / GLYCOLAX) packet 17 g, 17 g, Oral, Daily PRN, Emokpae, Courage, MD .  potassium chloride SA (KLOR-CON) CR tablet 20 mEq, 20 mEq, Oral, Daily, Emokpae, Courage, MD, 20 mEq at 02/02/19 0811 .  rivaroxaban (XARELTO) tablet 20 mg, 20 mg, Oral, Q supper, Roxan Hockey, MD .  sodium chloride flush (NS) 0.9 % injection 3 mL, 3 mL, Intravenous, Q12H, Emokpae, Courage, MD, 3 mL at 02/02/19 0812 .  sodium chloride flush (NS) 0.9 % injection 3 mL, 3 mL, Intravenous, PRN, Emokpae, Courage, MD .  traZODone (DESYREL) tablet 50 mg, 50 mg, Oral, QHS,  Zierle-Ghosh, Asia B, DO, 50 mg at 02/01/19 2111     SLEEP ARCHITECTURE  The study was initiated at 10:40:23 PM and ended at 4:48:27 AM.  Sleep onset time was 14.7 minutes and the sleep efficiency was 39.5%%. The total sleep time was 145.5 minutes.  Stage REM latency was N/A minutes.  The patient spent 17.2%% of the night in stage N1 sleep, 82.8%% in stage N2 sleep, 0.0%% in stage N3 and 0% in REM.  Alpha intrusion was absent.  Supine sleep was 86.25%. RESPIRATORY PARAMETERS  The overall apnea/hypopnea index (AHI) was 7.8 per hour. There were 0 total apneas, including 0 obstructive, 0 central and 0 mixed apneas. There were 19 hypopneas and 14 RERAs.  The AHI during Stage REM sleep was N/A per hour.  AHI while supine was 8.6 per hour.  The mean oxygen saturation was 93.1%. The minimum SpO2 during sleep was 64.0%.  snoring was noted during this study.   CARDIAC DATA The 2 lead EKG demonstrated sinus rhythm. The mean heart rate was 71.1 beats per minute. Other EKG findings include: None.   LEG MOVEMENT DATA The total PLMS were 0 with a resulting PLMS index of 0.0. Associated arousal with leg movement index was 0.0.  IMPRESSIONS 1.  Mild obstructive sleep apnea syndrome document the study.  The severity does not require positive pressure treatment. 2.  Abnormal sleep architecture is also observed with the severely reduced sleep efficiency, markedly fragmented sleep, absent slow sleep and absent REM sleep.  Delano Metz, MD Diplomate, American Board of Sleep Medicine.   ELECTRONICALLY SIGNED ON:  02/02/2019, 1:30 PM [place] PH: [phone]   FX: [phone] [place]

## 2019-02-02 NOTE — Progress Notes (Addendum)
This RN entered patient's room to replace SpO2 probe, which patient had removed herself. Upon replacement of SpO2 probe, patient's sats were 71% on 5LNC. Patient placed on BiPAP NIV 14/7 I/E RR 10 and FIO2 35% but is continuing to attempt to pull off both the mask but also the SpO2 probe, despite reassurance and education.  RRT coming to assess patient. MD made aware of situation and this RN has requested medications to help the patient rest and tolerate the BiPAP.  Celestia Khat, RN

## 2019-02-02 NOTE — Progress Notes (Signed)
Patient Demographics:    Eileen [name], is a 73 y.o. female, DOB - Aug 17, 1945, [id number]  Admit date - 01/30/2019   Admitting Physician Sharonlee Nine Denton Brick, MD  Outpatient Primary MD for the patient is Lavella Lemons, PA  LOS - 3   Chief Complaint  Patient presents with  . Shortness of Breath        Subjective:    Eileen [name] today has no fevers, no emesis,  No chest pain,   -Recurrent episodes of respiratory distress--- with hypercapnia/hypoxia and confusion/encephalopathy --Patient did not do well on BiPAP, she was switched to NIV mode, she is now improving on NIV mode  Assessment  & Plan :    Principal Problem:   Acute and chronic respiratory failure with hypercapnia (HCC) Active Problems:   Metabolic encephalopathy   Atrial fibrillation with RVR (HCC)   COPD exacerbation (HCC)   Respiratory failure with hypoxia (HCC)   UTI due to extended-spectrum beta lactamase (ESBL) producing Escherichia coli   Diabetes mellitus (HCC)   COPD (chronic obstructive pulmonary disease) (HCC)   Atrial flutter (HCC)   Bronchiectasis with acute exacerbation (HCC)   Dementia with behavioral disturbance ([place])  Brief Summary- 73 y.o. female, with PMHx ofRight breast cancer in remission,.L1 compression fracture s/p vertebroplasty/kyphoplasty, Anemia (iron def), Diverticulosis,Dm2, Aflutter/PAFib Moderate mitral regurgitation , Copd (end-stage/severe), chronic hypoxic respiratory failure on 4L, advanced dementia with cognitive and behavioral concerns admitted on 01/30/2019 with significant lethargy due to acute on chronic respiratory failure with severe hypercapnia   A/p 1)Acuteon chronicrespiratory failure with severe hypercapnia and hypoxia -  ABG on admission showed pH   7.188, PCO2  109, and PO2  57.6 -No definite pneumonia, suspect COPD exacerbation as the culprit --ABG and mental status  improved typically after using NIV machine -Please note that the patient has had frequent hospitalizations due to hypercapnic respiratory failure -Admitted for hypercapnia on 11/16/2018, and again on 12/26/2018 and now on 01/30/2019 - On 02/02/19-  -Recurrent episodes of respiratory distress--- with hypercapnia/hypoxia and confusion/encephalopathy --Patient did not do well on BiPAP, she was switched to NIV mode, she is now improving on NIV mode  - c/n  bronchodilators, mucolytics and c/n IV Solu-Medrol along with doxycycline/meropenem -Continue oxygen supplementation -COVID-19 negative   2)Social/Ethics-discussed with patient and daughter, patient is a full code  3)End-stage advanced COPD ---treat as above in #1, ???  History of bronchiectasis--- treat respiratory symptoms as above #1- -Recurrent episodes of respiratory distress--- with hypercapnia/hypoxia and confusion/encephalopathy --Patient did not do well on BiPAP, she was switched to NIV mode, she is now improving on NIV mode  4)-History of Atrial Flutter/PAFib--- continue Xarelto for anticoagulation, amiodarone 200 mg daily, metoprolol 25 mg twice daily and Cardizem CD 180 mg daily for rate control, watch for tachycardia with bronchodilators  5)DM2-recent A1c was 7.5 reflecting fair DM control, anticipate worsening hyperglycemia due to steroids - -hold Metformin due to lactic acidosis Use Novolog/Humalog Sliding scale insulin with Accu-Cheks/Fingersticks as ordered   6)HFpEF-with history of chronic dCHF, last known EF 60 to 65%, Okay to resume home Lasix -Appears euvolemic and compensated at this time  7) ESBL E. coli UTI---  continue IV meropenem based on urine culture results from 01/30/2019-- plan to discharge on fosfomycin x1 dose  on day of discharge, patient appears to have recurrent E. coli ESBL infections  Disposition/Need for in-Hospital Stay- patient unable to be discharged at this time due to --severe hypercapnic  respiratory failure requiring NIV--- patient will most likely need trilogy/NIV machine prior to discharge -Also requires IV meropenem for recurrent ESBL UTI  Code Status : Full   Family Communication:    (patient is alert, awake and coherent) -Discussed with patient's daughter Mikyah Tonthat and Tish Frederickson  Disposition Plan  : -Home when we are able to obtain NIV/trilogy machine  Consults  :  na DVT Prophylaxis  :  Xarelto - SCDs   Lab Results  Component Value Date   PLT 411 (H) 02/02/2019    Inpatient Medications  Scheduled Meds: . amiodarone  200 mg Oral Daily  . Chlorhexidine Gluconate Cloth  6 each Topical Daily  . diltiazem  180 mg Oral Daily  . guaiFENesin  600 mg Oral BID  . insulin aspart  0-5 Units Subcutaneous QHS  . insulin aspart  0-6 Units Subcutaneous TID WC  . ipratropium-albuterol  3 mL Nebulization Q6H  . LORazepam  0.5 mg Intravenous Once  . methylPREDNISolone (SOLU-MEDROL) injection  40 mg Intravenous Q12H  . metoprolol tartrate  25 mg Oral BID  . pantoprazole  40 mg Oral Daily  . potassium chloride SA  20 mEq Oral Daily  . rivaroxaban  20 mg Oral Q supper  . sodium chloride flush  3 mL Intravenous Q12H  . traZODone  50 mg Oral QHS   Continuous Infusions: . sodium chloride    . meropenem (MERREM) IV 1 g (02/02/19 0510)   PRN Meds:.sodium chloride, acetaminophen **OR** acetaminophen, albuterol, labetalol, ondansetron **OR** ondansetron (ZOFRAN) IV, polyethylene glycol, sodium chloride flush   Anti-infectives (From admission, onward)   Start     Dose/Rate Route Frequency Ordered Stop   01/30/19 1800  cefTRIAXone (ROCEPHIN) 1 g in sodium chloride 0.9 % 100 mL IVPB  Status:  Discontinued     1 g 200 mL/hr over 30 Minutes Intravenous Every 24 hours 01/30/19 1753 01/30/19 1758   01/30/19 1800  meropenem (MERREM) 1 g in sodium chloride 0.9 % 100 mL IVPB     1 g 200 mL/hr over 30 Minutes Intravenous Every 12 hours 01/30/19 1759     01/30/19 1100   doxycycline (VIBRA-TABS) tablet 100 mg  Status:  Discontinued     100 mg Oral Every 12 hours 01/30/19 1057 01/31/19 0754   01/30/19 0730  vancomycin (VANCOCIN) IVPB 1000 mg/200 mL premix     1,000 mg 200 mL/hr over 60 Minutes Intravenous  Once 01/30/19 0721 01/30/19 0911   01/30/19 0730  piperacillin-tazobactam (ZOSYN) IVPB 3.375 g     3.375 g 100 mL/hr over 30 Minutes Intravenous  Once 01/30/19 0721 01/30/19 0834       Objective:   Vitals:   02/02/19 1400 02/02/19 1442 02/02/19 1500 02/02/19 1608  BP: 120/82  (!) 146/82   Pulse: 89  (!) 115 79  Resp: (!) 23  17 (!) 26  Temp:      TempSrc:      SpO2:  96% 100% 97%  Weight:      Height:        Wt Readings from Last 3 Encounters:  02/02/19 63.4 kg  01/02/19 66.9 kg  12/11/18 67.2 kg     Intake/Output Summary (Last 24 hours) at 02/02/2019 1614 Last data filed at 02/02/2019 1200 Gross per 24 hour  Intake 203 ml  Output 200 ml  Net 3 ml   Physical Exam Gen:- Awake Alert, in no acute distress, able to speak in sentences HEENT:- [place].AT, No sclera icterus Nose- [place] 3 L/min/NIV Machine  Neck-Supple Neck,No JVD,.  Lungs-diminished bilaterally, few scattered wheezes CV- S1, S2 normal, regular  Abd-  +ve B.Sounds, Abd Soft, No tenderness,    Extremity/Skin:- No  edema, pedal pulses present  Psych-episodes of confusion and disorientation with CO2 gets high Neuro-generalized weakness, no new focal deficits, no tremors   Data Review:   Micro Results Recent Results (from the past 240 hour(s))  SARS Coronavirus 2 by RT PCR (hospital order, performed in [facility] hospital lab) Nasopharyngeal Nasopharyngeal Swab     Status: None   Collection Time: 01/30/19  5:34 AM   Specimen: Nasopharyngeal Swab  Result Value Ref Range Status   SARS Coronavirus 2 NEGATIVE NEGATIVE Final    Comment: (NOTE) If result is NEGATIVE SARS-CoV-2 target nucleic acids are NOT DETECTED. The SARS-CoV-2 RNA is generally detectable in upper and lower   respiratory specimens during the acute phase of infection. The lowest  concentration of SARS-CoV-2 viral copies this assay can detect is 250  copies / mL. A negative result does not preclude SARS-CoV-2 infection  and should not be used as the sole basis for treatment or other  patient management decisions.  A negative result may occur with  improper specimen collection / handling, submission of specimen other  than nasopharyngeal swab, presence of viral mutation(s) within the  areas targeted by this assay, and inadequate number of viral copies  (<250 copies / mL). A negative result must be combined with clinical  observations, patient history, and epidemiological information. If result is POSITIVE SARS-CoV-2 target nucleic acids are DETECTED. The SARS-CoV-2 RNA is generally detectable in upper and lower  respiratory specimens dur ing the acute phase of infection.  Positive  results are indicative of active infection with SARS-CoV-2.  Clinical  correlation with patient history and other diagnostic information is  necessary to determine patient infection status.  Positive results do  not rule out bacterial infection or co-infection with other viruses. If result is PRESUMPTIVE POSTIVE SARS-CoV-2 nucleic acids MAY BE PRESENT.   A presumptive positive result was obtained on the submitted specimen  and confirmed on repeat testing.  While 2019 novel coronavirus  (SARS-CoV-2) nucleic acids may be present in the submitted sample  additional confirmatory testing may be necessary for epidemiological  and / or clinical management purposes  to differentiate between  SARS-CoV-2 and other Sarbecovirus currently known to infect humans.  If clinically indicated additional testing with an alternate test  methodology [phone]) is advised. The SARS-CoV-2 RNA is generally  detectable in upper and lower respiratory sp ecimens during the acute  phase of infection. The expected result is Negative. Fact  Sheet for Patients:  StrictlyIdeas.no Fact Sheet for Healthcare Providers: BankingDealers.co.za This test is not yet approved or cleared by the [place] FDA and has been authorized for detection and/or diagnosis of SARS-CoV-2 by FDA under an Emergency Use Authorization (EUA).  This EUA will remain in effect (meaning this test can be used) for the duration of the COVID-19 declaration under Section 564(b)(1) of the Act, 21 U.S.C. section 360bbb-3(b)(1), unless the authorization is terminated or revoked sooner. Performed at [facility], [address], [place], [place] [address]   Blood culture (routine x 2)     Status: None (Preliminary result)   Collection Time: 01/30/19  6:48 AM   Specimen:  BLOOD  Result Value Ref Range Status   Specimen Description BLOOD LEFT WRIST  Final   Special Requests   Final    BOTTLES DRAWN AEROBIC AND ANAEROBIC Blood Culture results may not be optimal due to an inadequate volume of blood received in culture bottles   Culture   Final    NO GROWTH 2 DAYS Performed at [facility], [address], [place], [place] [address]    Report Status PENDING  Incomplete  Blood culture (routine x 2)     Status: None (Preliminary result)   Collection Time: 01/30/19  6:50 AM   Specimen: BLOOD  Result Value Ref Range Status   Specimen Description BLOOD LEFT WRIST  Final   Special Requests   Final    BOTTLES DRAWN AEROBIC ONLY Blood Culture results may not be optimal due to an inadequate volume of blood received in culture bottles   Culture   Final    NO GROWTH 2 DAYS Performed at [facility], [address] [place], [place] [address]    Report Status PENDING  Incomplete  Culture, Urine     Status: Abnormal   Collection Time: 01/30/19  8:45 AM   Specimen: Urine, Clean Catch  Result Value Ref Range Status   Specimen Description   Final    URINE, CLEAN CATCH Performed at [facility], [address], [place],  [place] [address]    Special Requests   Final    Normal Performed at [facility], [address], [place], [place] [address]    Culture (A)  Final    70,000 COLONIES/mL ESCHERICHIA COLI Confirmed Extended Spectrum Beta-Lactamase Producer (ESBL).  In bloodstream infections from ESBL organisms, carbapenems are preferred over piperacillin/tazobactam. They are shown to have a lower risk of mortality.    Report Status 02/01/2019 FINAL  Final   Organism ID, Bacteria ESCHERICHIA COLI (A)  Final      Susceptibility   Escherichia coli - MIC*    AMPICILLIN >=32 RESISTANT Resistant     CEFAZOLIN >=64 RESISTANT Resistant     CEFTRIAXONE >=64 RESISTANT Resistant     CIPROFLOXACIN >=4 RESISTANT Resistant     GENTAMICIN <=1 SENSITIVE Sensitive     IMIPENEM <=0.25 SENSITIVE Sensitive     NITROFURANTOIN <=16 SENSITIVE Sensitive     TRIMETH/SULFA <=20 SENSITIVE Sensitive     AMPICILLIN/SULBACTAM 4 SENSITIVE Sensitive     PIP/TAZO <=4 SENSITIVE Sensitive     Extended ESBL POSITIVE Resistant     * 70,000 COLONIES/mL ESCHERICHIA COLI  MRSA PCR Screening     Status: None   Collection Time: 01/30/19 11:09 AM   Specimen: Nasal Mucosa; Nasopharyngeal  Result Value Ref Range Status   MRSA by PCR NEGATIVE NEGATIVE Final    Comment:        The GeneXpert MRSA Assay (FDA approved for NASAL specimens only), is one component of a comprehensive MRSA colonization surveillance program. It is not intended to diagnose MRSA infection nor to guide or monitor treatment for MRSA infections. Performed at [facility], [address] [place], [place] [address]     Radiology Reports Dg Chest Portable 1 View  Result Date: 01/30/2019 CLINICAL DATA:  Shortness of breath. EXAM: PORTABLE CHEST 1 VIEW COMPARISON:  12/29/2018, additional priors. Most recent CT 09/27/2018 FINDINGS: Low lung volumes, unchanged from prior. Unchanged cardiomegaly. Unchanged mediastinal contours with bilateral hilar prominence. Streaky bibasilar  atelectasis or scarring, stable in the right slightly increased on the left. Chronic bronchial thickening with emphysema. Previous  suspected pleural effusions have improved with questionable residual on the left. No confluent airspace disease. No pneumothorax. IMPRESSION: 1. Unchanged chronic cardiomegaly. Unchanged chronic bronchial thickening and emphysema. 2. Streaky bibasilar opacities likely represent atelectasis or scarring and are unchanged from prior exam. 3. Suspected prior pleural effusions have diminished, possible residual pleural fluid on the left. Electronically Signed   By: Keith Rake M.D.   On: 01/30/2019 06:02     CBC Recent Labs  Lab 01/30/19 0648 01/31/19 0403 02/02/19 0526  WBC 9.8 7.5 9.8  HGB 9.6* 8.0* 8.8*  HCT 36.9 29.1* 32.9*  PLT 401* 429* 411*  MCV 96.3 91.2 92.9  MCH 25.1* 25.1* 24.9*  MCHC 26.0* 27.5* 26.7*  RDW 15.7* 16.0* 16.0*  LYMPHSABS 3.2  --   --   MONOABS 1.3*  --   --   EOSABS 0.2  --   --   BASOSABS 0.1  --   --     Chemistries  Recent Labs  Lab 01/30/19 0648 01/31/19 0403 02/02/19 0526  NA 140 141 141  K 4.7 4.5 5.0  CL 89* 90* 93*  CO2 32 39* 35*  GLUCOSE 134* 140* 243*  BUN 28* 25* 30*  CREATININE 1.20* 0.98 0.88  CALCIUM 9.1 8.7* 9.0   ------------------------------------------------------------------------------------------------------------------ No results for input(s): CHOL, HDL, LDLCALC, TRIG, CHOLHDL, LDLDIRECT in the last 72 hours.  Lab Results  Component Value Date   HGBA1C 7.5 (H) 12/11/2018   ------------------------------------------------------------------------------------------------------------------ No results for input(s): TSH, T4TOTAL, T3FREE, THYROIDAB in the last 72 hours.  Invalid input(s): FREET3 ------------------------------------------------------------------------------------------------------------------ No results for input(s): VITAMINB12, FOLATE, FERRITIN, TIBC, IRON, RETICCTPCT in the last  72 hours.  Coagulation profile No results for input(s): INR, PROTIME in the last 168 hours.  No results for input(s): DDIMER in the last 72 hours.  Cardiac Enzymes No results for input(s): CKMB, TROPONINI, MYOGLOBIN in the last 168 hours.  Invalid input(s): CK ------------------------------------------------------------------------------------------------------------------    Component Value Date/Time   BNP 439.0 (H) 01/30/2019 [id number]    Roxan Hockey M.D on 02/02/2019 at 4:14 PM  Go to [email or web address] - for contact info  [facility] - Office  [phone]

## 2019-02-02 NOTE — Progress Notes (Signed)
Patient is on a non invasive mode , rate of 10, FiO2 35, Exp pressure is 7 and Inspiratory pressure is 14 cm. . She is doing very well  VT around 550 to 650 delivered.

## 2019-02-02 NOTE — Progress Notes (Signed)
Patient was on BiPAP for around 20 minutes, taken back off by nurse. Patient refused to wear.

## 2019-02-02 NOTE — Progress Notes (Signed)
Patient removed BIPAP and refuses to wear it.

## 2019-02-02 NOTE — Progress Notes (Signed)
Patient placed on BiPAP, she is still a little belligerent but better. Hopefully she will be able to sleep some.

## 2019-02-03 LAB — GLUCOSE, CAPILLARY
Glucose-Capillary: 146 mg/dL — ABNORMAL HIGH (ref 70–99)
Glucose-Capillary: 142 mg/dL — ABNORMAL HIGH (ref 70–99)
Glucose-Capillary: 156 mg/dL — ABNORMAL HIGH (ref 70–99)
Glucose-Capillary: 217 mg/dL — ABNORMAL HIGH (ref 70–99)
Glucose-Capillary: 225 mg/dL — ABNORMAL HIGH (ref 70–99)

## 2019-02-03 MED ORDER — ORAL CARE MOUTH RINSE
15.0000 mL | Freq: Two times a day (BID) | OROMUCOSAL | Status: DC
  Administered 2019-02-03 – 2019-02-05 (×3): 15 mL via OROMUCOSAL

## 2019-02-03 MED ORDER — CHLORHEXIDINE GLUCONATE 0.12 % MT SOLN
15.0000 mL | Freq: Two times a day (BID) | OROMUCOSAL | Status: DC
  Administered 2019-02-03 – 2019-02-05 (×5): 15 mL via OROMUCOSAL
  Filled 2019-02-03 (×5): qty 15

## 2019-02-03 NOTE — Progress Notes (Signed)
Patient Demographics:    Eileen [name], is a 73 y.o. female, DOB - Jun 26, 1945, [id number]  Admit date - 01/30/2019   Admitting Physician Kayson Tasker Denton Brick, MD  Outpatient Primary MD for the patient is Lavella Lemons, PA  LOS - 4   Chief Complaint  Patient presents with  . Shortness of Breath        Subjective:    Eileen Corwin today has no fevers, no emesis,  No chest pain,   -Patient did much better overnight after being switched to NIV mode -Patient was on on a non invasive mode , rate of 10, FiO2 35, Exp pressure is 7 and Inspiratory pressure is 14 cm. . She is doing very well  VT around 550 to 650 delivered.  - -She feels rested after using NIV mode overnight, tolerating 3 L via nasal cannula at this time  Assessment  & Plan :    Principal Problem:   Acute and chronic respiratory failure with hypercapnia (HCC) Active Problems:   Metabolic encephalopathy   Atrial fibrillation with RVR (HCC)   COPD exacerbation (HCC)   Respiratory failure with hypoxia ([place])   UTI due to extended-spectrum beta lactamase (ESBL) producing Escherichia coli   Diabetes mellitus (HCC)   COPD (chronic obstructive pulmonary disease) (HCC)   Atrial flutter (HCC)   Bronchiectasis with acute exacerbation (HCC)   Dementia with behavioral disturbance (HCC)  Brief Summary- 73 y.o. female, with PMHx ofRight breast cancer in remission,.L1 compression fracture s/p vertebroplasty/kyphoplasty, Anemia (iron def), Diverticulosis,Dm2, Aflutter/PAFib Moderate mitral regurgitation , Copd (end-stage/severe), chronic hypoxic respiratory failure on 4L, advanced dementia with cognitive and behavioral concerns admitted on 01/30/2019 with significant lethargy due to acute on chronic respiratory failure with severe hypercapnia -Awaiting insurance approval of NIV/trilogy machine prior to DC home   A/p 1)Acuteon  chronicrespiratory failure with severe hypercapnia and hypoxia -  ABG on admission showed pH   7.188, PCO2  109, and PO2  57.6 -No definite pneumonia, suspect COPD exacerbation as the culprit -Please note that the patient has had frequent hospitalizations due to hypercapnic respiratory failure -Admitted for hypercapnia on 11/16/2018, and again on 12/26/2018 and now on 01/30/2019 - --Mental status and mentation improved after using NIV mode overnight - c/n  bronchodilators, mucolytics and c/n IV Solu-Medrol along with doxycycline/meropenem (meropenem is mostly for ESBL E. Coli UTI, rather than pulmonary infection) -Continue oxygen supplementation -COVID-19 negative  2)Social/Ethics-discussed with patient and daughter, patient is a full code  3)End-stage advanced COPD ---treat as above in #1, ???  History of bronchiectasis--- treat respiratory symptoms as above #1- -patient does much better when she uses NIV mode --Awaiting insurance approval of NIV/trilogy machine prior to DC home  4)-History of Atrial Flutter/PAFib--- continue Xarelto for anticoagulation, amiodarone 200 mg daily, metoprolol 25 mg twice daily and Cardizem CD 180 mg daily for rate control, watch for tachycardia with bronchodilators  5)DM2-recent A1c was 7.5 reflecting fair DM control, anticipate worsening hyperglycemia due to steroids - -hold Metformin due to lactic acidosis Use Novolog/Humalog Sliding scale insulin with Accu-Cheks/Fingersticks as ordered   6)HFpEF-with history of chronic dCHF, last known EF 60 to 65%, C/n Lasix -Appears euvolemic and compensated at this time  7) ESBL E. coli UTI---  continue IV meropenem based  on urine culture results from 01/30/2019-- plan to discharge on fosfomycin x1 dose on day of discharge, patient appears to have recurrent E. coli ESBL infections  Disposition/Need for in-Hospital Stay- patient unable to be discharged at this time due to --severe hypercapnic respiratory failure  requiring NIV--- patient will most likely need trilogy/NIV machine prior to discharge -Also requires IV meropenem for recurrent ESBL UTI  Code Status : Full   Family Communication:    (patient is alert, awake and coherent) -Discussed with patient's daughter Haivyn Buchwald and Tish Frederickson  Disposition Plan  : -Home when we are able to obtain NIV/trilogy [employer] approval of NIV/trilogy machine prior to DC home  Consults  :  na DVT Prophylaxis  :  Xarelto - SCDs   Lab Results  Component Value Date   PLT 411 (H) 02/02/2019    Inpatient Medications  Scheduled Meds: . amiodarone  200 mg Oral Daily  . chlorhexidine  15 mL Mouth Rinse BID  . Chlorhexidine Gluconate Cloth  6 each Topical Daily  . diltiazem  180 mg Oral Daily  . guaiFENesin  600 mg Oral BID  . insulin aspart  0-5 Units Subcutaneous QHS  . insulin aspart  0-6 Units Subcutaneous TID WC  . ipratropium-albuterol  3 mL Nebulization Q6H  . mouth rinse  15 mL Mouth Rinse q12n4p  . methylPREDNISolone (SOLU-MEDROL) injection  40 mg Intravenous Q12H  . metoprolol tartrate  25 mg Oral BID  . pantoprazole  40 mg Oral Daily  . potassium chloride SA  20 mEq Oral Daily  . rivaroxaban  20 mg Oral Q supper  . sodium chloride flush  3 mL Intravenous Q12H  . traZODone  50 mg Oral QHS   Continuous Infusions: . sodium chloride    . meropenem (MERREM) IV 1 g (02/03/19 0510)   PRN Meds:.sodium chloride, acetaminophen **OR** acetaminophen, albuterol, ALPRAZolam, haloperidol lactate, labetalol, ondansetron **OR** ondansetron (ZOFRAN) IV, polyethylene glycol, sodium chloride flush   Anti-infectives (From admission, onward)   Start     Dose/Rate Route Frequency Ordered Stop   01/30/19 1800  cefTRIAXone (ROCEPHIN) 1 g in sodium chloride 0.9 % 100 mL IVPB  Status:  Discontinued     1 g 200 mL/hr over 30 Minutes Intravenous Every 24 hours 01/30/19 1753 01/30/19 1758   01/30/19 1800  meropenem (MERREM) 1 g in sodium  chloride 0.9 % 100 mL IVPB     1 g 200 mL/hr over 30 Minutes Intravenous Every 12 hours 01/30/19 1759     01/30/19 1100  doxycycline (VIBRA-TABS) tablet 100 mg  Status:  Discontinued     100 mg Oral Every 12 hours 01/30/19 1057 01/31/19 0754   01/30/19 0730  vancomycin (VANCOCIN) IVPB 1000 mg/200 mL premix     1,000 mg 200 mL/hr over 60 Minutes Intravenous  Once 01/30/19 0721 01/30/19 0911   01/30/19 0730  piperacillin-tazobactam (ZOSYN) IVPB 3.375 g     3.375 g 100 mL/hr over 30 Minutes Intravenous  Once 01/30/19 0721 01/30/19 0834       Objective:   Vitals:   02/03/19 1400 02/03/19 1406 02/03/19 1500 02/03/19 1600  BP: 111/74  112/67 104/81  Pulse: 81  66 71  Resp: 17  (!) 26 (!) 22  Temp:      TempSrc:      SpO2: 93% 95% 100% 99%  Weight:      Height:        Wt Readings from Last 3 Encounters:  02/03/19 63.5 kg  01/02/19 66.9 kg  12/11/18 67.2 kg    Intake/Output Summary (Last 24 hours) at 02/03/2019 1630 Last data filed at 02/03/2019 0600 Gross per 24 hour  Intake 153 ml  Output 400 ml  Net -247 ml   Physical Exam Gen:- Awake Alert, in no acute distress, able to speak in sentences HEENT:- [place].AT, No sclera icterus Nose- Micro 3 L/min/NIV Machine  Neck-Supple Neck,No JVD,.  Lungs-diminished bilaterally, no significant wheezing CV- S1, S2 normal, regular  Abd-  +ve B.Sounds, Abd Soft, No tenderness,    Extremity/Skin:- No  edema, pedal pulses present  Psych-episodes of confusion and disorientation when  CO2 gets high Neuro-generalized weakness, no new focal deficits, no tremors   Data Review:   Micro Results Recent Results (from the past 240 hour(s))  SARS Coronavirus 2 by RT PCR (hospital order, performed in [facility] hospital lab) Nasopharyngeal Nasopharyngeal Swab     Status: None   Collection Time: 01/30/19  5:34 AM   Specimen: Nasopharyngeal Swab  Result Value Ref Range Status   SARS Coronavirus 2 NEGATIVE NEGATIVE Final    Comment: (NOTE) If result  is NEGATIVE SARS-CoV-2 target nucleic acids are NOT DETECTED. The SARS-CoV-2 RNA is generally detectable in upper and lower  respiratory specimens during the acute phase of infection. The lowest  concentration of SARS-CoV-2 viral copies this assay can detect is 250  copies / mL. A negative result does not preclude SARS-CoV-2 infection  and should not be used as the sole basis for treatment or other  patient management decisions.  A negative result may occur with  improper specimen collection / handling, submission of specimen other  than nasopharyngeal swab, presence of viral mutation(s) within the  areas targeted by this assay, and inadequate number of viral copies  (<250 copies / mL). A negative result must be combined with clinical  observations, patient history, and epidemiological information. If result is POSITIVE SARS-CoV-2 target nucleic acids are DETECTED. The SARS-CoV-2 RNA is generally detectable in upper and lower  respiratory specimens dur ing the acute phase of infection.  Positive  results are indicative of active infection with SARS-CoV-2.  Clinical  correlation with patient history and other diagnostic information is  necessary to determine patient infection status.  Positive results do  not rule out bacterial infection or co-infection with other viruses. If result is PRESUMPTIVE POSTIVE SARS-CoV-2 nucleic acids MAY BE PRESENT.   A presumptive positive result was obtained on the submitted specimen  and confirmed on repeat testing.  While 2019 novel coronavirus  (SARS-CoV-2) nucleic acids may be present in the submitted sample  additional confirmatory testing may be necessary for epidemiological  and / or clinical management purposes  to differentiate between  SARS-CoV-2 and other Sarbecovirus currently known to infect humans.  If clinically indicated additional testing with an alternate test  methodology [phone]) is advised. The SARS-CoV-2 RNA is generally   detectable in upper and lower respiratory sp ecimens during the acute  phase of infection. The expected result is Negative. Fact Sheet for Patients:  StrictlyIdeas.no Fact Sheet for Healthcare Providers: BankingDealers.co.za This test is not yet approved or cleared by the [place] FDA and has been authorized for detection and/or diagnosis of SARS-CoV-2 by FDA under an Emergency Use Authorization (EUA).  This EUA will remain in effect (meaning this test can be used) for the duration of the COVID-19 declaration under Section 564(b)(1) of the Act, 21 U.S.C. section 360bbb-3(b)(1), unless the authorization is terminated or revoked sooner. Performed at [facility], [address], [place], [place] [address]   Blood culture (routine x 2)     Status: None (Preliminary result)   Collection Time: 01/30/19  6:48 AM   Specimen: BLOOD  Result Value Ref Range Status   Specimen Description BLOOD LEFT WRIST  Final   Special Requests   Final    BOTTLES DRAWN AEROBIC AND ANAEROBIC Blood Culture results may not be optimal due to an inadequate volume of blood received in culture bottles   Culture   Final    NO GROWTH 4 DAYS Performed at [facility], [address] [place], [place] [address]    Report Status PENDING  Incomplete  Blood culture (routine x 2)     Status: None (Preliminary result)   Collection Time: 01/30/19  6:50 AM   Specimen: BLOOD  Result Value Ref Range Status   Specimen Description BLOOD LEFT WRIST  Final   Special Requests   Final    BOTTLES DRAWN AEROBIC ONLY Blood Culture results may not be optimal due to an inadequate volume of blood received in culture bottles   Culture   Final    NO GROWTH 4 DAYS Performed at [facility], [address], [place], [place] [address]    Report Status PENDING  Incomplete  Culture, Urine     Status: Abnormal   Collection Time: 01/30/19  8:45 AM   Specimen: Urine, Clean Catch  Result Value  Ref Range Status   Specimen Description   Final    URINE, CLEAN CATCH Performed at [facility], [address], [place], [place] [address]    Special Requests   Final    Normal Performed at [facility], [address], [place], [place] [address]    Culture (A)  Final    70,000 COLONIES/mL ESCHERICHIA COLI Confirmed Extended Spectrum Beta-Lactamase Producer (ESBL).  In bloodstream infections from ESBL organisms, carbapenems are preferred over piperacillin/tazobactam. They are shown to have a lower risk of mortality.    Report Status 02/01/2019 FINAL  Final   Organism ID, Bacteria ESCHERICHIA COLI (A)  Final      Susceptibility   Escherichia coli - MIC*    AMPICILLIN >=32 RESISTANT Resistant     CEFAZOLIN >=64 RESISTANT Resistant     CEFTRIAXONE >=64 RESISTANT Resistant     CIPROFLOXACIN >=4 RESISTANT Resistant     GENTAMICIN <=1 SENSITIVE Sensitive     IMIPENEM <=0.25 SENSITIVE Sensitive     NITROFURANTOIN <=16 SENSITIVE Sensitive     TRIMETH/SULFA <=20 SENSITIVE Sensitive     AMPICILLIN/SULBACTAM 4 SENSITIVE Sensitive     PIP/TAZO <=4 SENSITIVE Sensitive     Extended ESBL POSITIVE Resistant     * 70,000 COLONIES/mL ESCHERICHIA COLI  MRSA PCR Screening     Status: None   Collection Time: 01/30/19 11:09 AM   Specimen: Nasal Mucosa; Nasopharyngeal  Result Value Ref Range Status   MRSA by PCR NEGATIVE NEGATIVE Final    Comment:        The GeneXpert MRSA Assay (FDA approved for NASAL specimens only), is one component of a comprehensive MRSA colonization surveillance program. It is not intended to diagnose MRSA infection nor to guide or monitor treatment for MRSA infections. Performed at [facility], [address] [place], [place] [address]     Radiology Reports Dg Chest Portable 1 View  Result Date: 01/30/2019 CLINICAL DATA:  Shortness of breath. EXAM: PORTABLE CHEST 1 VIEW COMPARISON:  12/29/2018, additional priors. Most recent CT 09/27/2018 FINDINGS: Low lung  volumes, unchanged from prior. Unchanged cardiomegaly. Unchanged mediastinal contours with bilateral hilar prominence. Streaky bibasilar atelectasis or scarring, stable in the right slightly increased on the left. Chronic bronchial thickening with emphysema. Previous suspected pleural effusions have improved with questionable residual on the left. No confluent airspace disease. No pneumothorax. IMPRESSION: 1. Unchanged chronic cardiomegaly. Unchanged chronic bronchial thickening and emphysema. 2. Streaky bibasilar opacities likely represent atelectasis or scarring and are unchanged from prior exam. 3. Suspected prior pleural effusions have diminished, possible residual pleural fluid on the left. Electronically Signed   By: Keith Rake M.D.   On: 01/30/2019 06:02     CBC Recent Labs  Lab 01/30/19 0648 01/31/19 0403 02/02/19 0526  WBC 9.8 7.5 9.8  HGB 9.6* 8.0* 8.8*  HCT 36.9 29.1* 32.9*  PLT 401* 429* 411*  MCV 96.3 91.2 92.9  MCH 25.1* 25.1* 24.9*  MCHC 26.0* 27.5* 26.7*  RDW 15.7* 16.0* 16.0*  LYMPHSABS 3.2  --   --   MONOABS 1.3*  --   --   EOSABS 0.2  --   --   BASOSABS 0.1  --   --     Chemistries  Recent Labs  Lab 01/30/19 0648 01/31/19 0403 02/02/19 0526  NA 140 141 141  K 4.7 4.5 5.0  CL 89* 90* 93*  CO2 32 39* 35*  GLUCOSE 134* 140* 243*  BUN 28* 25* 30*  CREATININE 1.20* 0.98 0.88  CALCIUM 9.1 8.7* 9.0   ------------------------------------------------------------------------------------------------------------------ No results for input(s): CHOL, HDL, LDLCALC, TRIG, CHOLHDL, LDLDIRECT in the last 72 hours.  Lab Results  Component Value Date   HGBA1C 7.5 (H) 12/11/2018   ------------------------------------------------------------------------------------------------------------------ No results for input(s): TSH, T4TOTAL, T3FREE, THYROIDAB in the last 72 hours.  Invalid input(s): FREET3  ------------------------------------------------------------------------------------------------------------------ No results for input(s): VITAMINB12, FOLATE, FERRITIN, TIBC, IRON, RETICCTPCT in the last 72 hours.  Coagulation profile No results for input(s): INR, PROTIME in the last 168 hours.  No results for input(s): DDIMER in the last 72 hours.  Cardiac Enzymes No results for input(s): CKMB, TROPONINI, MYOGLOBIN in the last 168 hours.  Invalid input(s): CK ------------------------------------------------------------------------------------------------------------------    Component Value Date/Time   BNP 439.0 (H) 01/30/2019 [id number]    Roxan Hockey M.D on 02/03/2019 at 4:30 PM  Go to [email or web address] - for contact info  [facility] - Office  [phone]

## 2019-02-04 LAB — CULTURE, BLOOD (ROUTINE X 2)
Culture: NO GROWTH
Culture: NO GROWTH

## 2019-02-04 LAB — GLUCOSE, CAPILLARY
Glucose-Capillary: 129 mg/dL — ABNORMAL HIGH (ref 70–99)
Glucose-Capillary: 276 mg/dL — ABNORMAL HIGH (ref 70–99)
Glucose-Capillary: 201 mg/dL — ABNORMAL HIGH (ref 70–99)
Glucose-Capillary: 210 mg/dL — ABNORMAL HIGH (ref 70–99)

## 2019-02-04 LAB — CBC
HCT: 31 % — ABNORMAL LOW (ref 36.0–46.0)
Hemoglobin: 8.2 g/dL — ABNORMAL LOW (ref 12.0–15.0)
MCH: 24.6 pg — ABNORMAL LOW (ref 26.0–34.0)
MCHC: 26.5 g/dL — ABNORMAL LOW (ref 30.0–36.0)
MCV: 93.1 fL (ref 80.0–100.0)
Platelets: 348 10*3/uL (ref 150–400)
RBC: 3.33 MIL/uL — ABNORMAL LOW (ref 3.87–5.11)
RDW: 16.1 % — ABNORMAL HIGH (ref 11.5–15.5)
WBC: 7.1 10*3/uL (ref 4.0–10.5)
nRBC: 0 % (ref 0.0–0.2)

## 2019-02-04 LAB — BASIC METABOLIC PANEL
Anion gap: 9 (ref 5–15)
BUN: 23 mg/dL (ref 8–23)
CO2: 35 mmol/L — ABNORMAL HIGH (ref 22–32)
Calcium: 9.4 mg/dL (ref 8.9–10.3)
Chloride: 100 mmol/L (ref 98–111)
Creatinine, Ser: 0.69 mg/dL (ref 0.44–1.00)
GFR calc Af Amer: >60 mL/min (ref >60)
GFR calc non Af Amer: >60 mL/min (ref >60)
Glucose, Bld: 163 mg/dL — ABNORMAL HIGH (ref 70–99)
Potassium: 5.1 mmol/L (ref 3.5–5.1)
Sodium: 144 mmol/L (ref 135–145)

## 2019-02-04 NOTE — Evaluation (Signed)
Physical Therapy Evaluation Patient Details Name: Eileen [name] MRN: [id number] DOB: [birth year] Today's Date: 02/04/2019   History of Present Illness  Eileen [name]  is a 73 y.o. female, with PMHx of  Right breast cancer in remission,.L1 compression fracture s/p vertebroplasty/kyphoplasty, Anemia (iron def), Diverticulosis,  Dm2, Aflutter/PAFib Moderate mitral regurgitation ,  Copd (end-stage/severe), chronic hypoxic respiratory failure on 4 L, advanced dementia with cognitive and behavioral concerns presents to the ED by EMS with significant shortness of breath, altered mentation and hypoxia with O2 sats in the 60s despite being on 4 L of oxygen    Clinical Impression  Patient functioning near baseline for functional mobility and gait, demonstrates slow labored movement for taking steps in room with shakiness of extremities, no loss of balance, but becomes more unsteady once fatigued requiring increased time to walk back to bedside.  Patient tolerated sitting up in chair after therapy - RN aware.  Patient will benefit from continued physical therapy in hospital and recommended venue below to increase strength, balance, endurance for safe ADLs and gait.      Follow Up Recommendations Home health PT;Supervision for mobility/OOB;Supervision/Assistance - 24 hour    Equipment Recommendations  None recommended by PT    Recommendations for Other Services       Precautions / Restrictions Precautions Precautions: Fall Restrictions Weight Bearing Restrictions: No      Mobility  Bed Mobility Overal bed mobility: Needs Assistance Bed Mobility: Supine to Sit     Supine to sit: Min guard;Min assist     General bed mobility comments: increased time, labored movement  Transfers Overall transfer level: Needs assistance Equipment used: Rolling walker (2 wheeled) Transfers: Sit to/from [facility] Sit to Stand: Min assist Stand pivot transfers: Min assist        General transfer comment: increased time, labored movement  Ambulation/Gait Ambulation/Gait assistance: Min assist;Mod assist Gait Distance (Feet): 22 Feet Assistive device: Rolling walker (2 wheeled) Gait Pattern/deviations: Decreased step length - right;Decreased step length - left;Decreased stride length Gait velocity: decreased   General Gait Details: slow labored cadence with shakiness in extremities, no loss of balance, but becomes more unsteady once fatigued, on 4 LPM O2  Stairs            Wheelchair Mobility    Modified Rankin (Stroke Patients Only)       Balance Overall balance assessment: Needs assistance Sitting-balance support: Feet supported;No upper extremity supported Sitting balance-Leahy Scale: Fair Sitting balance - Comments: fair/good seated at EOB   Standing balance support: Bilateral upper extremity supported;During functional activity Standing balance-Leahy Scale: Fair Standing balance comment: using RW                             Pertinent Vitals/Pain Pain Assessment: No/denies pain    Home Living Family/patient expects to be discharged to:: Private residence Living Arrangements: Children Available Help at Discharge: Family;Available 24 hours/day Type of Home: House Home Access: Stairs to enter Entrance Stairs-Rails: Right;Left;Can reach both Entrance Stairs-Number of Steps: 3 Home Layout: One level Home Equipment: Walker - 2 wheels      Prior Function Level of Independence: Needs assistance   Gait / Transfers Assistance Needed: household ambulator with RW, uses 4 LPM O2 at all times at home  ADL's / Homemaking Assistance Needed: assisted for community ADLs' by family        Hand Dominance        Extremity/Trunk Assessment  Upper Extremity Assessment Upper Extremity Assessment: Generalized weakness    Lower Extremity Assessment Lower Extremity Assessment: Generalized weakness    Cervical / Trunk  Assessment Cervical / Trunk Assessment: Normal  Communication   Communication: No difficulties  Cognition Arousal/Alertness: Awake/alert Behavior During Therapy: WFL for tasks assessed/performed Overall Cognitive Status: Within Functional Limits for tasks assessed                                        General Comments      Exercises     Assessment/Plan    PT Assessment Patient needs continued PT services  PT Problem List Decreased strength;Decreased activity tolerance;Decreased balance;Decreased mobility       PT Treatment Interventions Gait training;Stair training;Functional mobility training;Therapeutic activities;Therapeutic exercise;Patient/family education    PT Goals (Current goals can be found in the Care Plan section)  Acute Rehab PT Goals Patient Stated Goal: return home PT Goal Formulation: With patient Time For Goal Achievement: 02/11/19 Potential to Achieve Goals: Good    Frequency Min 3X/week   Barriers to discharge        Co-evaluation               AM-PAC PT "6 Clicks" Mobility  Outcome Measure Help needed turning from your back to your side while in a flat bed without using bedrails?: A Little Help needed moving from lying on your back to sitting on the side of a flat bed without using bedrails?: A Little Help needed moving to and from a bed to a chair (including a wheelchair)?: A Little Help needed standing up from a chair using your arms (e.g., wheelchair or bedside chair)?: A Little Help needed to walk in hospital room?: A Lot Help needed climbing 3-5 steps with a railing? : A Lot 6 Click Score: 16    End of Session Equipment Utilized During Treatment: Oxygen Activity Tolerance: Patient tolerated treatment well;Patient limited by fatigue Patient left: in chair;with call bell/phone within reach Nurse Communication: Mobility status PT Visit Diagnosis: Unsteadiness on feet (R26.81);Other abnormalities of gait and mobility  (R26.89);Muscle weakness (generalized) (M62.81)    Time: [id number] PT Time Calculation (min) (ACUTE ONLY): 32 min   Charges:   PT Evaluation $PT Eval Moderate Complexity: 1 Mod PT Treatments $Therapeutic Activity: 23-37 mins        12:16 PM, 02/04/19 Lonell Grandchild, MPT Physical Therapist with [facility] [phone] office [phone] mobile phone

## 2019-02-04 NOTE — Progress Notes (Signed)
Pt daughter [name] called staff at 2am to let them know that pt's 02 sensor at home was recalled and not functioning properly.

## 2019-02-04 NOTE — Progress Notes (Signed)
Patient Demographics:    Eileen [name], is a 73 y.o. female, DOB - 06-22-1945, [id number]  Admit date - 01/30/2019   Admitting Physician Brigetta Beckstrom Denton Brick, MD  Outpatient Primary MD for the patient is Lavella Lemons, PA  LOS - 5   Chief Complaint  Patient presents with  . Shortness of Breath        Subjective:    Eileen Bonny today has no fevers, no emesis,  No chest pain,    Continues to do well with NIV mode--- she will most likely do well with NIV/trilogy machine upon discharge home   Assessment  & Plan :    Principal Problem:   Acute and chronic respiratory failure with hypercapnia ([place]) Active Problems:   Metabolic encephalopathy   Atrial fibrillation with RVR (HCC)   COPD exacerbation (HCC)   Respiratory failure with hypoxia ([place])   UTI due to extended-spectrum beta lactamase (ESBL) producing Escherichia coli   Diabetes mellitus (HCC)   COPD (chronic obstructive pulmonary disease) (HCC)   Atrial flutter (HCC)   Bronchiectasis with acute exacerbation (HCC)   Dementia with behavioral disturbance ([place])  Brief Summary- 73 y.o. female, with PMHx ofRight breast cancer in remission,.L1 compression fracture s/p vertebroplasty/kyphoplasty, Anemia (iron def), Diverticulosis,Dm2, Aflutter/PAFib Moderate mitral regurgitation , Copd (end-stage/severe), chronic hypoxic respiratory failure on 4L, advanced dementia with cognitive and behavioral concerns admitted on 01/30/2019 with significant lethargy due to acute on chronic respiratory failure with severe hypercapnia -Awaiting insurance approval of NIV/trilogy machine prior to DC home   A/p 1)Acuteon chronicrespiratory failure with severe hypercapnia and hypoxia -  ABG on admission showed pH   7.188, PCO2  109, and PO2  57.6 -No definite pneumonia, suspect COPD exacerbation as the culprit -Please note that the patient has had frequent  hospitalizations due to hypercapnic respiratory failure -Admitted for hypercapnia on 11/16/2018, and again on 12/26/2018 and now on 01/30/2019 - --Mental status and mentation improved after using NIV mode overnight - c/n  bronchodilators, mucolytics and c/n IV Solu-Medrol along with doxycycline/meropenem (meropenem is mostly for ESBL E. Coli UTI, rather than pulmonary infection) -Continue oxygen supplementation -COVID-19 negative  NIV/Trilogy Requirement Statement:- Patient has severe/end-stage COPD with chronic hypoxic and hypercapnic respiratory failure.  Despite aggressive treatment and recurrent hospitalization for same- patient continues to exhibit signs of significant hypercapnia associated with chronic respiratory failure secondary to severe/end-stage COPD.  Patient requires the use of NIV both nightly and daytime to help with exacerbation periods.  The use of the NIV will treat patient's high PCO2 levels (please see ABG results on BIPAP and on NIV), and use of NIV can reduce risk of exacerbation in future hospitalizations when used at night and during the day.  Patient will need these advanced settings in conjunction with the current medication regimen and aggressive pulmonary treatment: BiPAP is not an option due to his functional limitations and the severity of the patient's condition.   Failure to have NIV available  could lead to death.   patient had significant episodes of severe lethargy and unresponsiveness due to very very high CO2 levels  2)Social/Ethics-discussed with patient and daughter, patient is a full code  3)End-stage advanced COPD ---treat as above in #1, ???  History of bronchiectasis--- treat respiratory symptoms as  above #1- -patient does much better when she uses NIV mode --Awaiting insurance approval of NIV/trilogy machine prior to DC home  4)-History of Atrial Flutter/PAFib--- continue Xarelto for anticoagulation, amiodarone 200 mg daily, metoprolol 25 mg twice  daily and Cardizem CD 180 mg daily for rate control, watch for tachycardia with bronchodilators  5)DM2-recent A1c was 7.5 reflecting fair DM control, anticipate worsening hyperglycemia due to steroids - -hold Metformin due to lactic acidosis Use Novolog/Humalog Sliding scale insulin with Accu-Cheks/Fingersticks as ordered   6)HFpEF-with history of chronic dCHF, last known EF 60 to 65%, C/n Lasix -Appears euvolemic and compensated at this time  7) ESBL E. coli UTI---  continue IV meropenem based on urine culture results from 01/30/2019-- plan to discharge on fosfomycin x1 dose on day of discharge, patient appears to have recurrent E. coli ESBL infections  Disposition/Need for in-Hospital Stay- patient unable to be discharged at this time due to --severe hypercapnic respiratory failure requiring NIV--- patient will most likely need trilogy/NIV machine prior to discharge -Also requires IV meropenem for recurrent ESBL UTI  Code Status : Full   Family Communication:    (patient is alert, awake and coherent) -Discussed with patient's daughter Bonnetta Seiwert and Tish Frederickson  Disposition Plan  : -Home when we are able to obtain NIV/trilogy [employer] approval of NIV/trilogy machine prior to DC home  Consults  :  na DVT Prophylaxis  :  Xarelto - SCDs   Lab Results  Component Value Date   PLT 348 02/04/2019    Inpatient Medications  Scheduled Meds: . amiodarone  200 mg Oral Daily  . chlorhexidine  15 mL Mouth Rinse BID  . Chlorhexidine Gluconate Cloth  6 each Topical Daily  . diltiazem  180 mg Oral Daily  . guaiFENesin  600 mg Oral BID  . insulin aspart  0-5 Units Subcutaneous QHS  . insulin aspart  0-6 Units Subcutaneous TID WC  . ipratropium-albuterol  3 mL Nebulization Q6H  . mouth rinse  15 mL Mouth Rinse q12n4p  . methylPREDNISolone (SOLU-MEDROL) injection  40 mg Intravenous Q12H  . metoprolol tartrate  25 mg Oral BID  . pantoprazole  40 mg Oral Daily  .  rivaroxaban  20 mg Oral Q supper  . sodium chloride flush  3 mL Intravenous Q12H  . traZODone  50 mg Oral QHS   Continuous Infusions: . sodium chloride    . meropenem (MERREM) IV 1 g (02/04/19 1715)   PRN Meds:.sodium chloride, acetaminophen **OR** acetaminophen, albuterol, ALPRAZolam, haloperidol lactate, labetalol, ondansetron **OR** ondansetron (ZOFRAN) IV, polyethylene glycol, sodium chloride flush   Anti-infectives (From admission, onward)   Start     Dose/Rate Route Frequency Ordered Stop   01/30/19 1800  cefTRIAXone (ROCEPHIN) 1 g in sodium chloride 0.9 % 100 mL IVPB  Status:  Discontinued     1 g 200 mL/hr over 30 Minutes Intravenous Every 24 hours 01/30/19 1753 01/30/19 1758   01/30/19 1800  meropenem (MERREM) 1 g in sodium chloride 0.9 % 100 mL IVPB     1 g 200 mL/hr over 30 Minutes Intravenous Every 12 hours 01/30/19 1759     01/30/19 1100  doxycycline (VIBRA-TABS) tablet 100 mg  Status:  Discontinued     100 mg Oral Every 12 hours 01/30/19 1057 01/31/19 0754   01/30/19 0730  vancomycin (VANCOCIN) IVPB 1000 mg/200 mL premix     1,000 mg 200 mL/hr over 60 Minutes Intravenous  Once 01/30/19 0721 01/30/19 0911   01/30/19 0730  piperacillin-tazobactam (ZOSYN) IVPB 3.375 g     3.375 g 100 mL/hr over 30 Minutes Intravenous  Once 01/30/19 0721 01/30/19 0834       Objective:   Vitals:   02/04/19 1600 02/04/19 1640 02/04/19 1650 02/04/19 1700  BP:  106/68  109/70  Pulse: 72 63 63 69  Resp: 17 (!) 27 (!) 23 (!) 29  Temp:    97.7 F (36.5 C)  TempSrc:    Oral  SpO2: 98% 100% 100% 99%  Weight:      Height:        Wt Readings from Last 3 Encounters:  02/04/19 62.8 kg  01/02/19 66.9 kg  12/11/18 67.2 kg    Intake/Output Summary (Last 24 hours) at 02/04/2019 1821 Last data filed at 02/04/2019 1300 Gross per 24 hour  Intake 880 ml  Output 375 ml  Net 505 ml   Physical Exam Gen:- Awake Alert, in no acute distress, able to speak in sentences HEENT:- [place].AT, No  sclera icterus Nose- [place] 2 L/min/NIV Machine  Neck-Supple Neck,No JVD,.  Lungs-diminished bilaterally, no significant wheezing CV- S1, S2 normal, regular  Abd-  +ve B.Sounds, Abd Soft, No tenderness,    Extremity/Skin:- No  edema, pedal pulses present  Psych-episodes of confusion and disorientation when  CO2 gets high Neuro-generalized weakness, no new focal deficits, no tremors   Data Review:   Micro Results Recent Results (from the past 240 hour(s))  SARS Coronavirus 2 by RT PCR (hospital order, performed in [facility] hospital lab) Nasopharyngeal Nasopharyngeal Swab     Status: None   Collection Time: 01/30/19  5:34 AM   Specimen: Nasopharyngeal Swab  Result Value Ref Range Status   SARS Coronavirus 2 NEGATIVE NEGATIVE Final    Comment: (NOTE) If result is NEGATIVE SARS-CoV-2 target nucleic acids are NOT DETECTED. The SARS-CoV-2 RNA is generally detectable in upper and lower  respiratory specimens during the acute phase of infection. The lowest  concentration of SARS-CoV-2 viral copies this assay can detect is 250  copies / mL. A negative result does not preclude SARS-CoV-2 infection  and should not be used as the sole basis for treatment or other  patient management decisions.  A negative result may occur with  improper specimen collection / handling, submission of specimen other  than nasopharyngeal swab, presence of viral mutation(s) within the  areas targeted by this assay, and inadequate number of viral copies  (<250 copies / mL). A negative result must be combined with clinical  observations, patient history, and epidemiological information. If result is POSITIVE SARS-CoV-2 target nucleic acids are DETECTED. The SARS-CoV-2 RNA is generally detectable in upper and lower  respiratory specimens dur ing the acute phase of infection.  Positive  results are indicative of active infection with SARS-CoV-2.  Clinical  correlation with patient history and other diagnostic  information is  necessary to determine patient infection status.  Positive results do  not rule out bacterial infection or co-infection with other viruses. If result is PRESUMPTIVE POSTIVE SARS-CoV-2 nucleic acids MAY BE PRESENT.   A presumptive positive result was obtained on the submitted specimen  and confirmed on repeat testing.  While 2019 novel coronavirus  (SARS-CoV-2) nucleic acids may be present in the submitted sample  additional confirmatory testing may be necessary for epidemiological  and / or clinical management purposes  to differentiate between  SARS-CoV-2 and other Sarbecovirus currently known to infect humans.  If clinically indicated additional testing with an alternate test  methodology [phone]) is  advised. The SARS-CoV-2 RNA is generally  detectable in upper and lower respiratory sp ecimens during the acute  phase of infection. The expected result is Negative. Fact Sheet for Patients:  StrictlyIdeas.no Fact Sheet for Healthcare Providers: BankingDealers.co.za This test is not yet approved or cleared by the [place] FDA and has been authorized for detection and/or diagnosis of SARS-CoV-2 by FDA under an Emergency Use Authorization (EUA).  This EUA will remain in effect (meaning this test can be used) for the duration of the COVID-19 declaration under Section 564(b)(1) of the Act, 21 U.S.C. section 360bbb-3(b)(1), unless the authorization is terminated or revoked sooner. Performed at [facility], [address] [place], [place] [address]   Blood culture (routine x 2)     Status: None   Collection Time: 01/30/19  6:48 AM   Specimen: BLOOD  Result Value Ref Range Status   Specimen Description BLOOD LEFT WRIST  Final   Special Requests   Final    BOTTLES DRAWN AEROBIC AND ANAEROBIC Blood Culture results may not be optimal due to an inadequate volume of blood received in culture bottles   Culture   Final    NO  GROWTH 5 DAYS Performed at [facility], [address], [place], [place] [address]    Report Status 02/04/2019 FINAL  Final  Blood culture (routine x 2)     Status: None   Collection Time: 01/30/19  6:50 AM   Specimen: BLOOD  Result Value Ref Range Status   Specimen Description BLOOD LEFT WRIST  Final   Special Requests   Final    BOTTLES DRAWN AEROBIC ONLY Blood Culture results may not be optimal due to an inadequate volume of blood received in culture bottles   Culture   Final    NO GROWTH 5 DAYS Performed at [facility], [address] [place], [place] [address]    Report Status 02/04/2019 FINAL  Final  Culture, Urine     Status: Abnormal   Collection Time: 01/30/19  8:45 AM   Specimen: Urine, Clean Catch  Result Value Ref Range Status   Specimen Description   Final    URINE, CLEAN CATCH Performed at [facility], [address], [place], [place] [address]    Special Requests   Final    Normal Performed at [facility], [address], [place], [place] [address]    Culture (A)  Final    70,000 COLONIES/mL ESCHERICHIA COLI Confirmed Extended Spectrum Beta-Lactamase Producer (ESBL).  In bloodstream infections from ESBL organisms, carbapenems are preferred over piperacillin/tazobactam. They are shown to have a lower risk of mortality.    Report Status 02/01/2019 FINAL  Final   Organism ID, Bacteria ESCHERICHIA COLI (A)  Final      Susceptibility   Escherichia coli - MIC*    AMPICILLIN >=32 RESISTANT Resistant     CEFAZOLIN >=64 RESISTANT Resistant     CEFTRIAXONE >=64 RESISTANT Resistant     CIPROFLOXACIN >=4 RESISTANT Resistant     GENTAMICIN <=1 SENSITIVE Sensitive     IMIPENEM <=0.25 SENSITIVE Sensitive     NITROFURANTOIN <=16 SENSITIVE Sensitive     TRIMETH/SULFA <=20 SENSITIVE Sensitive     AMPICILLIN/SULBACTAM 4 SENSITIVE Sensitive     PIP/TAZO <=4 SENSITIVE Sensitive     Extended ESBL POSITIVE Resistant     * 70,000 COLONIES/mL ESCHERICHIA COLI  MRSA PCR  Screening     Status: None   Collection Time: 01/30/19 11:09 AM   Specimen: Nasal Mucosa; Nasopharyngeal  Result Value Ref Range Status   MRSA by PCR NEGATIVE NEGATIVE Final    Comment:        The GeneXpert MRSA Assay (FDA approved for NASAL specimens only), is one component of a comprehensive MRSA colonization surveillance program. It is not intended to diagnose MRSA infection nor to guide or monitor treatment for MRSA infections. Performed at [facility], [address], [place], [place] [address]     Radiology Reports Dg Chest Portable 1 View  Result Date: 01/30/2019 CLINICAL DATA:  Shortness of breath. EXAM: PORTABLE CHEST 1 VIEW COMPARISON:  12/29/2018, additional priors. Most recent CT 09/27/2018 FINDINGS: Low lung volumes, unchanged from prior. Unchanged cardiomegaly. Unchanged mediastinal contours with bilateral hilar prominence. Streaky bibasilar atelectasis or scarring, stable in the right slightly increased on the left. Chronic bronchial thickening with emphysema. Previous suspected pleural effusions have improved with questionable residual on the left. No confluent airspace disease. No pneumothorax. IMPRESSION: 1. Unchanged chronic cardiomegaly. Unchanged chronic bronchial thickening and emphysema. 2. Streaky bibasilar opacities likely represent atelectasis or scarring and are unchanged from prior exam. 3. Suspected prior pleural effusions have diminished, possible residual pleural fluid on the left. Electronically Signed   By: Keith Rake M.D.   On: 01/30/2019 06:02     CBC Recent Labs  Lab 01/30/19 0648 01/31/19 0403 02/02/19 0526 02/04/19 0536  WBC 9.8 7.5 9.8 7.1  HGB 9.6* 8.0* 8.8* 8.2*  HCT 36.9 29.1* 32.9* 31.0*  PLT 401* 429* 411* 348  MCV 96.3 91.2 92.9 93.1  MCH 25.1* 25.1* 24.9* 24.6*  MCHC 26.0* 27.5* 26.7* 26.5*  RDW 15.7* 16.0* 16.0* 16.1*  LYMPHSABS 3.2  --   --   --   MONOABS 1.3*  --   --   --   EOSABS 0.2  --   --   --   BASOSABS 0.1  --    --   --     Chemistries  Recent Labs  Lab 01/30/19 0648 01/31/19 0403 02/02/19 0526 02/04/19 0536  NA 140 141 141 144  K 4.7 4.5 5.0 5.1  CL 89* 90* 93* 100  CO2 32 39* 35* 35*  GLUCOSE 134* 140* 243* 163*  BUN 28* 25* 30* 23  CREATININE 1.20* 0.98 0.88 0.69  CALCIUM 9.1 8.7* 9.0 9.4   ------------------------------------------------------------------------------------------------------------------ No results for input(s): CHOL, HDL, LDLCALC, TRIG, CHOLHDL, LDLDIRECT in the last 72 hours.  Lab Results  Component Value Date   HGBA1C 7.5 (H) 12/11/2018   ------------------------------------------------------------------------------------------------------------------ No results for input(s): TSH, T4TOTAL, T3FREE, THYROIDAB in the last 72 hours.  Invalid input(s): FREET3 ------------------------------------------------------------------------------------------------------------------ No results for input(s): VITAMINB12, FOLATE, FERRITIN, TIBC, IRON, RETICCTPCT in the last 72 hours.  Coagulation profile No results for input(s): INR, PROTIME in the last 168 hours.  No results for input(s): DDIMER in the last 72 hours.  Cardiac Enzymes No results for input(s): CKMB, TROPONINI, MYOGLOBIN in the last 168 hours.  Invalid input(s): CK ------------------------------------------------------------------------------------------------------------------    Component Value Date/Time   BNP 439.0 (H) 01/30/2019 [id number]    Roxan Hockey M.D on 02/04/2019 at 6:21 PM  Go to [email or web address] - for contact info  [facility] - Office  [phone]

## 2019-02-04 NOTE — Progress Notes (Signed)
Patient has remained on Non-Invasive Ventilation overnight, 14/7, 35% FiO2 , without complication.  Tolerating well.

## 2019-02-04 NOTE — Plan of Care (Signed)
  Problem: Acute Rehab PT Goals(only PT should resolve) Goal: Pt Will Go Supine/Side To Sit Outcome: Progressing Flowsheets (Taken 02/04/2019 1217) Pt will go Supine/Side to Sit: with supervision Goal: Patient Will Transfer Sit To/From Stand Outcome: Progressing [place] (Taken 02/04/2019 1217) Patient will transfer sit to/from stand: with min guard assist Goal: Pt Will Transfer Bed To Chair/Chair To Bed Outcome: Progressing Flowsheets (Taken 02/04/2019 1217) Pt will Transfer Bed to Chair/Chair to Bed: min guard assist Goal: Pt Will Ambulate Outcome: Progressing Flowsheets (Taken 02/04/2019 1217) Pt will Ambulate:  50 feet  with minimal assist  with min guard assist  with rolling walker   12:18 PM, 02/04/19 Lonell Grandchild, MPT Physical Therapist with [facility] [phone] office [phone] mobile phone

## 2019-02-04 NOTE — Care Management Important Message (Signed)
Important Message  Patient Details  Name: Eileen [name] MRN: [id number] Date of Birth: 1945/11/26   Medicare Important Message Given:  Yes(given to RN to deliver due to contact precautions)     [name] 02/04/2019, 2:13 PM

## 2019-02-05 LAB — BASIC METABOLIC PANEL WITH GFR
Anion gap: 9 (ref 5–15)
BUN: 26 mg/dL — ABNORMAL HIGH (ref 8–23)
CO2: 36 mmol/L — ABNORMAL HIGH (ref 22–32)
Calcium: 9.2 mg/dL (ref 8.9–10.3)
Chloride: 96 mmol/L — ABNORMAL LOW (ref 98–111)
Creatinine, Ser: 0.67 mg/dL (ref 0.44–1.00)
GFR calc Af Amer: >60 mL/min
GFR calc non Af Amer: >60 mL/min
Glucose, Bld: 230 mg/dL — ABNORMAL HIGH (ref 70–99)
Potassium: 5.5 mmol/L — ABNORMAL HIGH (ref 3.5–5.1)
Sodium: 141 mmol/L (ref 135–145)

## 2019-02-05 LAB — GLUCOSE, CAPILLARY
Glucose-Capillary: 199 mg/dL — ABNORMAL HIGH (ref 70–99)
Glucose-Capillary: 249 mg/dL — ABNORMAL HIGH (ref 70–99)

## 2019-02-05 MED ORDER — RIVAROXABAN 20 MG PO TABS: 20.0000 mg | ORAL_TABLET | Freq: Every day | ORAL | 3 refills | Status: AC

## 2019-02-05 MED ORDER — FUROSEMIDE 40 MG PO TABS: 40.0000 mg | ORAL_TABLET | Freq: Every day | ORAL | 5 refills | Status: AC

## 2019-02-05 MED ORDER — DILTIAZEM HCL ER COATED BEADS 180 MG PO CP24: 180.0000 mg | ORAL_CAPSULE | Freq: Every day | ORAL | 3 refills | Status: AC

## 2019-02-05 MED ORDER — AMIODARONE HCL 200 MG PO TABS: 200.0000 mg | ORAL_TABLET | Freq: Every day | ORAL | 4 refills | Status: AC

## 2019-02-05 MED ORDER — QUETIAPINE FUMARATE 25 MG PO TABS: 25.0000 mg | ORAL_TABLET | Freq: Every day | ORAL | 3 refills | Status: AC

## 2019-02-05 MED ORDER — ONDANSETRON HCL 4 MG PO TABS: 4.0000 mg | ORAL_TABLET | Freq: Four times a day (QID) | ORAL | 0 refills | Status: AC | PRN

## 2019-02-05 MED ORDER — BUDESONIDE-FORMOTEROL FUMARATE 160-4.5 MCG/ACT IN AERO: 2.0000 | INHALATION_SPRAY | Freq: Two times a day (BID) | RESPIRATORY_TRACT | 12 refills | Status: AC

## 2019-02-05 MED ORDER — FOSFOMYCIN TROMETHAMINE 3 G PO PACK
3.0000 g | PACK | Freq: Once | ORAL | Status: AC
  Administered 2019-02-05: 3 g via ORAL
  Filled 2019-02-05: qty 3

## 2019-02-05 MED ORDER — PANTOPRAZOLE SODIUM 40 MG PO TBEC: 40.0000 mg | DELAYED_RELEASE_TABLET | Freq: Every day | ORAL | 2 refills | Status: AC

## 2019-02-05 MED ORDER — GUAIFENESIN ER 600 MG PO TB12: 600.0000 mg | ORAL_TABLET | Freq: Two times a day (BID) | ORAL | 0 refills | Status: AC

## 2019-02-05 MED ORDER — ACETAMINOPHEN 325 MG PO TABS: 650.0000 mg | ORAL_TABLET | Freq: Four times a day (QID) | ORAL | 1 refills | Status: AC | PRN

## 2019-02-05 MED ORDER — ALBUTEROL SULFATE (2.5 MG/3ML) 0.083% IN NEBU: 2.5000 mg | INHALATION_SOLUTION | Freq: Four times a day (QID) | RESPIRATORY_TRACT | 12 refills | Status: AC | PRN

## 2019-02-05 MED ORDER — METOPROLOL TARTRATE 25 MG PO TABS: 25.0000 mg | ORAL_TABLET | Freq: Two times a day (BID) | ORAL | 5 refills | Status: AC

## 2019-02-05 MED ORDER — METFORMIN HCL 1000 MG PO TABS: 1000.0000 mg | ORAL_TABLET | Freq: Two times a day (BID) | ORAL | 11 refills | Status: AC

## 2019-02-05 MED ORDER — PREDNISONE 20 MG PO TABS: 20.0000 mg | ORAL_TABLET | ORAL | 0 refills | Status: DC

## 2019-02-05 MED ORDER — COMPRESSOR/NEBULIZER MISC: 0 refills | Status: AC

## 2019-02-05 NOTE — Progress Notes (Signed)
Inpatient Diabetes Program Recommendations  AACE/ADA: New Consensus Statement on Inpatient Glycemic Control   Target Ranges:  Prepandial:   less than 140 mg/dL      Peak postprandial:   less than 180 mg/dL (1-2 hours)      Critically ill patients:  140 - 180 mg/dL   Results for Eileen [name], Eileen [name] (MRN [id number]) as of 02/05/2019 11:37  Ref. Range 02/04/2019 07:41 02/04/2019 11:45 02/04/2019 16:58 02/04/2019 21:35 02/05/2019 07:35  Glucose-Capillary Latest Ref Range: 70 - 99 mg/dL 129 (H) 276 (H) 201 (H) 210 (H) 199 (H)   Review of Glycemic Control  Diabetes history: DM2 Outpatient Diabetes medications: Metformin 1000 mg BID Current orders for Inpatient glycemic control: Novolog 0-6 units TID with meals, Novolog 0-5 units QHS; Solumedrol 40 mg Q12H  Inpatient Diabetes Program Recommendations:   Insulin-Meal Coverage: If steroids are continued, please consider ordering Novolog 3 units TID with meals for meal coverage if patient eats at least 50% of meals.  Thanks, Barnie Alderman, RN, MSN, CDE Diabetes Coordinator Inpatient Diabetes Program [phone] (Team Pager from 8am to 5pm)

## 2019-02-05 NOTE — Discharge Instructions (Signed)
1) please use your NIV/trilogy machine every night when you sleep to prevent significant breathing difficulties and reduce the risk of dying 2) please take the rest of your medications as prescribed 3) please follow-up with your primary care physician within a week for recheck and reevaluation 4) please use your nebulizer treatments and breathing treatments as advised 5) absolutely no tobacco exposure 6) it is very very important that you continue to use your trilogy/NIV machine when you sleep to reduce your risk of dying

## 2019-02-05 NOTE — Progress Notes (Signed)
Patient is to be discharged home and in stable condition. Patient and daughter given discharge instructions and verbalized understanding. Patient and daughter were instructed on use of NIV and expressed understanding. Patient to be escorted out by staff via wheelchair.  Celestia Khat, RN

## 2019-02-05 NOTE — Discharge Summary (Signed)
Eileen [name], is a 73 y.o. female  DOB [birth year]  MRN [id number].  Admission date:  01/30/2019  Admitting Physician  Roxan Hockey, MD  Discharge Date:  02/05/2019   Primary MD  Lavella Lemons, PA  Recommendations for primary care physician for things to follow:   1) please use your NIV/trilogy machine every night when you sleep to prevent significant breathing difficulties and reduce the risk of dying 2) please take the rest of your medications as prescribed 3) please follow-up with your primary care physician within a week for recheck and reevaluation 4) please use your nebulizer treatments and breathing treatments as advised 5) absolutely no tobacco exposure 6) it is very very important that you continue to use your trilogy/NIV machine when you sleep to reduce your risk of dying  Admission Diagnosis  Lactic acidosis [E87.2] AKI (acute kidney injury) ([place]) [N17.9] Acute on chronic respiratory failure with hypoxia and hypercapnia (HCC) [J96.21, J96.22]   Discharge Diagnosis  Lactic acidosis [E87.2] AKI (acute kidney injury) ([place]) [N17.9] Acute on chronic respiratory failure with hypoxia and hypercapnia (HCC) [J96.21, J96.22]    Principal Problem:   Acute and chronic respiratory failure with hypercapnia (HCC) Active Problems:   Metabolic encephalopathy   Atrial fibrillation with RVR (HCC)   COPD exacerbation (HCC)   Respiratory failure with hypoxia ([place])   UTI due to extended-spectrum beta lactamase (ESBL) producing Escherichia coli   Diabetes mellitus ([place])   COPD (chronic obstructive pulmonary disease) ([place])   Atrial flutter ([place])   Bronchiectasis with acute exacerbation ([place])   Dementia with behavioral disturbance ([place])      Past Medical History:  Diagnosis Date  . Asthma   . Atrial flutter ([place])   . Cancer [facility])    Right breast  . COPD (chronic obstructive pulmonary disease)  ([place])   . Dementia ([place])   . Diverticulosis   . Hemorrhoids   . History of breast cancer    right breast  . Hypertension   . On home O2   . Type 2 diabetes mellitus ([place])     Past Surgical History:  Procedure Laterality Date  . BREAST SURGERY  2011   Right breast mastectomy  . CARDIOVERSION N/A 06/27/2018   Procedure: CARDIOVERSION;  Surgeon: Arnoldo Lenis, MD;  [facility];  Service: Endoscopy;  Laterality: N/A;  . CESAREAN SECTION    . COLONOSCOPY N/A 01/22/2018   Procedure: COLONOSCOPY;  Surgeon: Rogene Houston, MD;  Location: [facility];  Service: Endoscopy;  Laterality: N/A;  . HERNIA REPAIR     RIH  . IR VERTEBROPLASTY LUMBAR BX INC UNI/BIL INC/INJECT/IMAGING  09/05/2018  . MASTECTOMY  2011   right breast  . TEE WITHOUT CARDIOVERSION N/A 06/27/2018   Procedure: TRANSESOPHAGEAL ECHOCARDIOGRAM (TEE) WITH PROPOFOL;  Surgeon: Arnoldo Lenis, MD;  [facility];  Service: Endoscopy;  Laterality: N/A;     HPI  from the history and physical done on the day of admission:    Eileen [name]  is a 73 y.o. female, with PMHx ofRight breast cancer in remission,.L1 compression fracture s/p vertebroplasty/kyphoplasty, Anemia (iron def), Diverticulosis,Dm2, Aflutter/PAFib Moderate mitral regurgitation , Copd (end-stage/severe), chronic hypoxic respiratory failure on 4L, advanced dementia with cognitive and behavioral concerns presents to the ED by EMS with significant shortness of breath, altered mentation and hypoxia with O2 sats in the 60s despite being on 4 L of oxygen -in ED-patient is found to have PCO2 of 109 and the pH was 7.18, she was placed on BiPAP -Apparently no fevers, patient had worsening shortness of breath with cough and wheezing PTA -Family reports no chest pains -Patient with recurrent frequent hospitalizations for respiratory distress and hypercapnia  -History is limited as patient is lethargic and respiratory distress requiring  continuous BiPAP --Additional history obtained from patient's daughter Eileen [name] at [phone] Unable to Reach other daughter Ms Eileen [name] - (primary caregiver)-[phone]- left message   -Chest x-ray unchanged from prior findings of emphysema -Lactic acid elevated in the setting of severe respiratory acidosis -Repeat ABG after couple of hours of continuous BiPAP shows pH is up to 7.343 from 7.188, PCO2 is down to 67.4 from 109, and PO2 is up to 102 from 57.6     Hospital Course:    Brief Summary- 73 y.o.female,[name],.L1 compression fracture s/p vertebroplasty/kyphoplasty, Anemia (iron def), Diverticulosis,Dm2, Aflutter/PAFibModerate mitral regurgitation ,Copd (end-stage/severe), chronichypoxicrespiratory failure on 4L,advanced dementia with cognitive and behavioral concerns admitted on 01/30/2019 with significant lethargy due to acute on chronic respiratory failure with severe hypercapnia D/c home with  NIV/trilogy machine   A/p 1)Acuteon chronicrespiratory failure with severehypercapnia andhypoxia -  ABG on admission showed pH   7.188, PCO2  109,and PO2  57.6 -No definite pneumonia, suspect COPD exacerbation as the culprit -Please note that the patient has had frequent hospitalizations due to hypercapnic respiratory failure -Admitted for hypercapnia on 11/16/2018, and again on 12/26/2018 and now on 01/30/2019 - --Mental status and mentation improved after using NIV mode overnight -D/c home with  NIV/trilogy machine -Treated with bronchodilators, mucolytics and c/n IV Solu-Medrol along with doxycycline/meropenem (meropenem is mostly for ESBL E. Coli UTI, rather than pulmonary infection) -Continue oxygen supplementation -COVID-19 negative -No further antibiotics indicated, and discharged home on prednisone  NIV/Trilogy Requirement Statement:- Patient has severe/end-stage COPD with chronic hypoxic and hypercapnic  respiratory failure. Despite aggressive treatment and recurrent hospitalization for same-patient continues to exhibit signs of significant hypercapnia associated with chronic respiratory failure secondary to severe/end-stage COPD. Patient requires the use of NIV both nightly and daytime to help with exacerbation periods. The use of the NIV will treat patient's high PCO2 levels (please see ABG results on BIPAP and on NIV), and use of NIV can reduce risk of exacerbation in future hospitalizations when used at night and during the day.  Patient will need these advanced settings in conjunction with the current medication regimen and aggressive pulmonary treatment: BiPAP is not an option due to his functional limitations and the severity of the patient's condition.   Failure to haveNIV available could lead to death. patient had significant episodes of severe lethargy and unresponsiveness due to very very high CO2 levels -D/c home with  NIV/trilogy machine  2)Social/Ethics-discussed with patient and daughter,patient is a full code  3)End-stage advanced COPD---treat as above in #1, ???  History of bronchiectasis--- treat respiratory symptoms as above #1- -patient does much better when she uses NIV mode D/c home with  NIV/trilogy machine  4)-History of Atrial Flutter/PAFib---continue Xarelto for anticoagulation, amiodarone 200  mg daily,metoprolol  and Cardizem CD  daily for rate control,   5)DM2-recent A1c was 7.5 reflecting fair DM control, anticipate worsening hyperglycemia due to steroids- -hold Metformin due to lactic acidosis -Anticipate glycemic control will improve with tapering of steroids  6)HFpEF-with history of chronicdCHF,last known EF 60 to 65%, C/n Lasix -Appears euvolemic and compensated at this time  7) ESBL E. coli UTI---  treated with IV meropenem based on urine culture results from 01/30/2019-- patient received fosfomycin x1 dose on day of discharge,  patient appears to have recurrent E. coli ESBL infections  Disposition-D/c home with  NIV/trilogy machine  Code Status : Full   Family Communication:    (patient is alert, awake and coherent) -Discussed with patient's daughter Jessamae Hessel and Eileen [name]  Disposition Plan  : -D/c home with  NIV/trilogy machine  Consults  :  na DVT Prophylaxis  :  Xarelto - SCDs   Discharge Condition: Stable  Follow UP  Follow-up Information    [facility] Follow up.   Why: NIV         Diet and Activity recommendation:  As advised  Discharge Instructions    Discharge Instructions    Call MD for:  difficulty breathing, headache or visual disturbances   Complete by: As directed    Call MD for:  extreme fatigue   Complete by: As directed    Call MD for:  persistant dizziness or light-headedness   Complete by: As directed    Call MD for:  persistant nausea and vomiting   Complete by: As directed    Call MD for:  temperature >100.4   Complete by: As directed    Diet - low sodium heart healthy   Complete by: As directed    Discharge instructions   Complete by: As directed    1) please use your NIV/trilogy machine every night when you sleep to prevent significant breathing difficulties and reduce the risk of dying 2) please take the rest of your medications as prescribed 3) please follow-up with your primary care physician within a week for recheck and reevaluation 4) please use your nebulizer treatments and breathing treatments as advised 5) absolutely no tobacco exposure 6) it is very very important that you continue to use your trilogy/NIV machine when you sleep to reduce your risk of dying   Increase activity slowly   Complete by: As directed         Discharge Medications     Allergies as of 02/05/2019      Reactions   Codeine Other (See Comments)   "jittery"      Medication List    STOP taking these medications   metFORMIN 1000 MG (MOD) 24 hr tablet  Commonly known as: GLUMETZA Replaced by: metFORMIN 1000 MG tablet   mirtazapine 7.5 MG tablet Commonly known as: REMERON     TAKE these medications   acetaminophen 325 MG tablet Commonly known as: TYLENOL Take 2 tablets (650 mg total) by mouth every 6 (six) hours as needed for mild pain (or Fever >/= 101). What changed:   medication strength  how much to take  when to take this  reasons to take this   albuterol (2.5 MG/3ML) 0.083% nebulizer solution Commonly known as: PROVENTIL Take 3 mLs (2.5 mg total) by nebulization every 6 (six) hours as needed for wheezing or shortness of breath.   amiodarone 200 MG tablet Commonly known as: PACERONE Take 1 tablet (200 mg total) by mouth daily.  budesonide-formoterol 160-4.5 MCG/ACT inhaler Commonly known as: Symbicort Inhale 2 puffs into the lungs 2 (two) times daily.   Compressor/Nebulizer Misc Use nebulizer machine with nebulizer solution as prescribed for COPD   diltiazem 180 MG 24 hr capsule Commonly known as: CARDIZEM CD Take 1 capsule (180 mg total) by mouth daily.   furosemide 40 MG tablet Commonly known as: LASIX Take 1 tablet (40 mg total) by mouth daily. What changed: how much to take   guaiFENesin 600 MG 12 hr tablet Commonly known as: MUCINEX Take 1 tablet (600 mg total) by mouth 2 (two) times daily.   metFORMIN 1000 MG tablet Commonly known as: Glucophage Take 1 tablet (1,000 mg total) by mouth 2 (two) times daily with a meal. Replaces: metFORMIN 1000 MG (MOD) 24 hr tablet   metoprolol tartrate 25 MG tablet Commonly known as: LOPRESSOR Take 1 tablet (25 mg total) by mouth 2 (two) times daily.   ondansetron 4 MG tablet Commonly known as: ZOFRAN Take 1 tablet (4 mg total) by mouth every 6 (six) hours as needed for nausea.   pantoprazole 40 MG tablet Commonly known as: PROTONIX Take 1 tablet (40 mg total) by mouth daily.   potassium chloride SA 20 MEQ tablet Commonly known as: KLOR-CON Take 1  tablet (20 mEq total) by mouth daily. While Taking Lasix   predniSONE 20 MG tablet Commonly known as: Deltasone Take 1 tablet (20 mg total) by mouth See admin instructions. Please take 2 tablets (40 mg) every morning with breakfast for 5 days then 1 tablet every morning with breakfast for another 5 days then stop   QUEtiapine 25 MG tablet Commonly known as: SEROQUEL Take 1 tablet (25 mg total) by mouth at bedtime.   rivaroxaban 20 MG Tabs tablet Commonly known as: Xarelto Take 1 tablet (20 mg total) by mouth daily with supper.            Durable Medical Equipment  (From admission, onward)         Start     Ordered   02/05/19 1637  For home use only DME Nebulizer machine  Once    Question Answer Comment  Patient needs a nebulizer to treat with the following condition COPD (chronic obstructive pulmonary disease) ([place])   Length of Need Lifetime      02/05/19 1636   02/05/19 1637  For home use only DME Nebulizer/meds  Once    Question Answer Comment  Patient needs a nebulizer to treat with the following condition COPD (chronic obstructive pulmonary disease) ([place])   Length of Need Lifetime      02/05/19 1636          Major procedures and Radiology Reports - PLEASE review detailed and final reports for all details, in brief -   Dg Chest Portable 1 View  Result Date: 01/30/2019 CLINICAL DATA:  Shortness of breath. EXAM: PORTABLE CHEST 1 VIEW COMPARISON:  12/29/2018, additional priors. Most recent CT 09/27/2018 FINDINGS: Low lung volumes, unchanged from prior. Unchanged cardiomegaly. Unchanged mediastinal contours with bilateral hilar prominence. Streaky bibasilar atelectasis or scarring, stable in the right slightly increased on the left. Chronic bronchial thickening with emphysema. Previous suspected pleural effusions have improved with questionable residual on the left. No confluent airspace disease. No pneumothorax. IMPRESSION: 1. Unchanged chronic cardiomegaly. Unchanged  chronic bronchial thickening and emphysema. 2. Streaky bibasilar opacities likely represent atelectasis or scarring and are unchanged from prior exam. 3. Suspected prior pleural effusions have diminished, possible residual pleural fluid on the  left. Electronically Signed   By: Keith Rake M.D.   On: 01/30/2019 06:02    Micro Results   Recent Results (from the past 240 hour(s))  SARS Coronavirus 2 by RT PCR (hospital order, performed in [facility] hospital lab) Nasopharyngeal Nasopharyngeal Swab     Status: None   Collection Time: 01/30/19  5:34 AM   Specimen: Nasopharyngeal Swab  Result Value Ref Range Status   SARS Coronavirus 2 NEGATIVE NEGATIVE Final    Comment: (NOTE) If result is NEGATIVE SARS-CoV-2 target nucleic acids are NOT DETECTED. The SARS-CoV-2 RNA is generally detectable in upper and lower  respiratory specimens during the acute phase of infection. The lowest  concentration of SARS-CoV-2 viral copies this assay can detect is 250  copies / mL. A negative result does not preclude SARS-CoV-2 infection  and should not be used as the sole basis for treatment or other  patient management decisions.  A negative result may occur with  improper specimen collection / handling, submission of specimen other  than nasopharyngeal swab, presence of viral mutation(s) within the  areas targeted by this assay, and inadequate number of viral copies  (<250 copies / mL). A negative result must be combined with clinical  observations, patient history, and epidemiological information. If result is POSITIVE SARS-CoV-2 target nucleic acids are DETECTED. The SARS-CoV-2 RNA is generally detectable in upper and lower  respiratory specimens dur ing the acute phase of infection.  Positive  results are indicative of active infection with SARS-CoV-2.  Clinical  correlation with patient history and other diagnostic information is  necessary to determine patient infection status.  Positive results  do  not rule out bacterial infection or co-infection with other viruses. If result is PRESUMPTIVE POSTIVE SARS-CoV-2 nucleic acids MAY BE PRESENT.   A presumptive positive result was obtained on the submitted specimen  and confirmed on repeat testing.  While 2019 novel coronavirus  (SARS-CoV-2) nucleic acids may be present in the submitted sample  additional confirmatory testing may be necessary for epidemiological  and / or clinical management purposes  to differentiate between  SARS-CoV-2 and other Sarbecovirus currently known to infect humans.  If clinically indicated additional testing with an alternate test  methodology [phone]) is advised. The SARS-CoV-2 RNA is generally  detectable in upper and lower respiratory sp ecimens during the acute  phase of infection. The expected result is Negative. Fact Sheet for Patients:  StrictlyIdeas.no Fact Sheet for Healthcare Providers: BankingDealers.co.za This test is not yet approved or cleared by the [place] FDA and has been authorized for detection and/or diagnosis of SARS-CoV-2 by FDA under an Emergency Use Authorization (EUA).  This EUA will remain in effect (meaning this test can be used) for the duration of the COVID-19 declaration under Section 564(b)(1) of the Act, 21 U.S.C. section 360bbb-3(b)(1), unless the authorization is terminated or revoked sooner. Performed at [facility], [address], [place], [place] [address]   Blood culture (routine x 2)     Status: None   Collection Time: 01/30/19  6:48 AM   Specimen: BLOOD  Result Value Ref Range Status   Specimen Description BLOOD LEFT WRIST  Final   Special Requests   Final    BOTTLES DRAWN AEROBIC AND ANAEROBIC Blood Culture results may not be optimal due to an inadequate volume of blood received in culture bottles   Culture   Final    NO GROWTH 5 DAYS Performed at [facility], [address] [place],  [address]  Report Status 02/04/2019 FINAL  Final  Blood culture (routine x 2)     Status: None   Collection Time: 01/30/19  6:50 AM   Specimen: BLOOD  Result Value Ref Range Status   Specimen Description BLOOD LEFT WRIST  Final   Special Requests   Final    BOTTLES DRAWN AEROBIC ONLY Blood Culture results may not be optimal due to an inadequate volume of blood received in culture bottles   Culture   Final    NO GROWTH 5 DAYS Performed at [facility], [address] [place], [place] [address]    Report Status 02/04/2019 FINAL  Final  Culture, Urine     Status: Abnormal   Collection Time: 01/30/19  8:45 AM   Specimen: Urine, Clean Catch  Result Value Ref Range Status   Specimen Description   Final    URINE, CLEAN CATCH Performed at [facility], [address], [place], [place] [address]    Special Requests   Final    Normal Performed at [facility], [address] [place], [place] [address]    Culture (A)  Final    70,000 COLONIES/mL ESCHERICHIA COLI Confirmed Extended Spectrum Beta-Lactamase Producer (ESBL).  In bloodstream infections from ESBL organisms, carbapenems are preferred over piperacillin/tazobactam. They are shown to have a lower risk of mortality.    Report Status 02/01/2019 FINAL  Final   Organism ID, Bacteria ESCHERICHIA COLI (A)  Final      Susceptibility   Escherichia coli - MIC*    AMPICILLIN >=32 RESISTANT Resistant     CEFAZOLIN >=64 RESISTANT Resistant     CEFTRIAXONE >=64 RESISTANT Resistant     CIPROFLOXACIN >=4 RESISTANT Resistant     GENTAMICIN <=1 SENSITIVE Sensitive     IMIPENEM <=0.25 SENSITIVE Sensitive     NITROFURANTOIN <=16 SENSITIVE Sensitive     TRIMETH/SULFA <=20 SENSITIVE Sensitive     AMPICILLIN/SULBACTAM 4 SENSITIVE Sensitive     PIP/TAZO <=4 SENSITIVE Sensitive     Extended ESBL POSITIVE Resistant     * 70,000 COLONIES/mL ESCHERICHIA COLI  MRSA PCR Screening     Status: None   Collection Time: 01/30/19 11:09 AM   Specimen: Nasal Mucosa;  Nasopharyngeal  Result Value Ref Range Status   MRSA by PCR NEGATIVE NEGATIVE Final    Comment:        The GeneXpert MRSA Assay (FDA approved for NASAL specimens only), is one component of a comprehensive MRSA colonization surveillance program. It is not intended to diagnose MRSA infection nor to guide or monitor treatment for MRSA infections. Performed at [facility], [address] [place], [place] [address]        Today   Subjective    Eileen [name] today has no new complaints --Patient talks better when she sleeps with NIV machine D/c home with  NIV/trilogy machine          Patient has been seen and examined prior to discharge   Objective   Blood pressure 117/77, pulse 71, temperature 98.4 F (36.9 C), temperature source Oral, resp. rate 18, height 5\' 6"  (1.676 m), weight 63.5 kg, SpO2 100 %.   Intake/Output Summary (Last 24 hours) at 02/05/2019 1638 Last data filed at 02/04/2019 1815 Gross per 24 hour  Intake 340 ml  Output -  Net 340 ml   Exam Gen:- Awake Alert, in no acute distress, able to speak in sentences HEENT:- [place].AT, No sclera icterus Nose- [place] 2 L/min/NIV Machine  Neck-Supple Neck,No JVD,.  Lungs-improved  air movement no significant wheezing CV- S1, S2 normal, regular  Abd-  +ve B.Sounds, Abd Soft, No tenderness,    Extremity/Skin:- No  edema, pedal pulses present  Psych-more alert, more coherent,  neuro-generalized weakness, no new focal deficits, no tremors   Data Review   CBC w Diff:  Lab Results  Component Value Date   WBC 7.1 02/04/2019   HGB 8.2 (L) 02/04/2019   HCT 31.0 (L) 02/04/2019   PLT 348 02/04/2019   LYMPHOPCT 33 01/30/2019   MONOPCT 14 01/30/2019   EOSPCT 2 01/30/2019   BASOPCT 1 01/30/2019    CMP:  Lab Results  Component Value Date   NA 141 02/05/2019   K 5.5 (H) 02/05/2019   CL 96 (L) 02/05/2019   CO2 36 (H) 02/05/2019   BUN 26 (H) 02/05/2019   CREATININE 0.67 02/05/2019   PROT 5.5 (L) 12/31/2018    ALBUMIN 3.0 (L) 12/31/2018   BILITOT 0.4 12/31/2018   ALKPHOS 62 12/31/2018   AST 9 (L) 12/31/2018   ALT 14 12/31/2018  .   Total Discharge time is about 33 minutes  Roxan Hockey M.D on 02/05/2019 at 4:38 PM  Go to [email or web address] -  for contact info  [facility] - Office  [phone]

## 2019-02-05 NOTE — TOC Transition Note (Signed)
Transition of Care [facility]) - CM/SW Discharge Note   Patient Details  Name: Eileen [name] MRN: [id number] Date of Birth: [birth year]  [facility] CM/SW Contact:  Boneta Lucks, RN Phone Number: 02/05/2019, 2:12 PM   Clinical Narrative:   Patient discharging home.  [facility] is coming at 4PM to set up NIV before leaving the hospital.      Barriers to Discharge: Barriers Resolved   Patient Goals and CMS Choice Patient states their goals for this hospitalization and ongoing recovery are:: to go home.                DME Arranged: NIV DME Agency: [facility] Date DME Agency Contacted: 01/31/19 Time DME Agency [id number] Representative spoke with at DME Agency: Juliann Pulse C      Readmission Risk Interventions Readmission Risk Prevention Plan 01/30/2019 01/02/2019 12/11/2018  Transportation Screening Complete Complete -  PCP or Specialist Appt within 5-7 Days - - -  Home Care Screening - - -  Medication Review (RN CM) - - -  Medication Review ([place]) Complete Complete -  PCP or Specialist appointment within 3-5 days of discharge - Not Complete -  [place] or [place] - Complete -  SW Recovery Care/Counseling Consult - Complete -  Palliative Care Screening - Not Complete -  Comments - - -  [place] - Not Complete Not Applicable  Some recent data might be hidden

## 2019-02-09 DIAGNOSIS — R0902 Hypoxemia: Secondary | ICD-10-CM | POA: Diagnosis not present

## 2019-02-09 DIAGNOSIS — J449 Chronic obstructive pulmonary disease, unspecified: Secondary | ICD-10-CM | POA: Diagnosis not present

## 2019-02-10 ENCOUNTER — Telehealth: Payer: Self-pay | Admitting: Internal Medicine

## 2019-02-10 NOTE — Telephone Encounter (Signed)
Spoke with patient's daughter, Levaeh Goyette regarding Palliative services and she was in agreement with this.  I have scheduled a Telephone Consult for 02/13/19 @ 1:30 PM.

## 2019-02-13 ENCOUNTER — Other Ambulatory Visit: Payer: Self-pay | Admitting: Internal Medicine

## 2019-02-13 ENCOUNTER — Encounter: Payer: Self-pay | Admitting: Internal Medicine

## 2019-02-13 ENCOUNTER — Other Ambulatory Visit: Payer: Self-pay

## 2019-02-13 DIAGNOSIS — Z7189 Other specified counseling: Secondary | ICD-10-CM

## 2019-02-13 DIAGNOSIS — Z515 Encounter for palliative care: Secondary | ICD-10-CM

## 2019-02-13 NOTE — Progress Notes (Signed)
Dec 3rd, 2020 [place] Consult Note Telephone: [phone]  Fax: [phone]  Due to the current COVID-19 infection/crises, the family prefer, and have given their verbal consent for, a provider visit via telemedicine. HIPPA policies of confidentially were discussed. Video-audio (telehealth) contact was unable to be done due technical barriers from the patients side.  PATIENT NAME: Eileen [name] DOB: 08-11-1945 MRN: [id number]  [address], [place] [phone]   PRIMARY CARE PROVIDER:  Lavella Lemons, PA [place],  [place] [address]  [facility] ([facility])  [facility] [place]) for Meds  REFERRING PROVIDER: Tawni Carnes, [place]  RESPONSIBLE PARTY:  Bekah Seher (dtr) [phone].  (dtr) Sharyn Lull [phone] or [phone]  ASSESSMENT / RECOMMENDATIONS:  1. Advance Care Planning:  A. Directives: Discussed with PCG daughter [name]; desire for full resuscitation in the event of a cardiopulmonary arrest. Would wish for ventilatory support and full scope of medical interventions, as needed.    B. Goals of Care: Keep out of hospital.  2. Cognitive / Functional status: Patient is mostly A & O x 3 but can become confused, disoriented, and off topic in the evenings. Daughter [name] reports that patient is easily redirected and reoriented at those times. No behavioral issues. [name] notes onset of patient's symptoms of dementia noticeable about 3-6 months ago. She was living by herself before [name] moved in to assist with care (about 1 year ago). Patient is independent in ADLs (tolieting, personal hygiene, dressing when daughter lays out clothing, and with feeding). [name] occasional provides assistance to transfer, and provides some stand by assistance (for stability) when patient ambulates with her walker. Good safety awareness. Patient is continent of bowel and bladder, and uses BSC at  night without assist. Multi past hospital admissions for COPD exacerbations, but after this last admissions, she was discharged on Trilogy which she consistently uses at bed time and during the day, especially if napping. She feels her strength and energy is markedly improved. H/O atrial fibrillation; seems to be maintaining NSR but she's unable to tell subjectively is she flips out of rhythm. Denies pain or nausea. Her current weight is 147 lbs, which is a loss of about 23 lbs over these last 5 months, per daughter. At [place]" her BMI is 27kg/m2. She has dependent type LE edema.  3. Family Supports / Coping: Patient has 5 children. Daughter  [name] resides with patient. Very large, supportive, extended family live close by and provide a lot of support.5 children. Patient enjoys watching television; seems happy and in good spirits.  4. Follow up Palliative Care Visit: Thursday 04/17/2019 at 1:30pm, Tele-health/telephonic.  I spent 60 minutes providing this consultation from 1:30pm to 2:30pm. More than 50% of the time in this consultation was spent coordinating communication.   HISTORY OF PRESENT ILLNESS:  Eileen A Wojtowicz is a 73 y.o. year old female with h/o advanced stage COPD/Bronchiectasis with acute / chronic respiratory failure (home 02), metabolic encephalopathy, dementia (with behavioral disturbances), atrial flutter/PAfib (Xarelto, amiodarone, metoprolol, Cardizem), DM2 (A1c 7.5), chronic dCHR (EF 60-65%), UTIs, R breat cancer, HTN, diverticulosis.  11/19-11/25/2020: Hospitalized [facility]): acute on chronic resp failure (PH 7.188, PCO2 77.4, P02 57.6). acute kidney failure Home NIV/trilogy qhs.   Palliative Care was asked to help address goals of care.   CODE STATUS: Full code  PPS: 40% HOSPICE ELIGIBILITY/DIAGNOSIS: TBD  PAST MEDICAL HISTORY:  Past Medical History:  Diagnosis Date   Asthma  Atrial flutter ([place])    Cancer ([place])    Right breast   COPD (chronic obstructive  pulmonary disease) ([facility])    Dementia ([facility])    Diverticulosis    Hemorrhoids    History of breast cancer    right breast   Hypertension    On home O2    Type 2 diabetes mellitus ([place])     SOCIAL HX:  Social History   Tobacco Use   Smoking status: Former Smoker    Packs/day: 0.50    Years: 32.00    Pack years: 16.00    Types: Cigarettes    Start date: 12/12/1962    Quit date: 03/13/1994    Years since quitting: 24.9   Smokeless tobacco: Never Used  Substance Use Topics   Alcohol use: No    Alcohol/week: 0.0 standard drinks    ALLERGIES:  Allergies  Allergen Reactions   Codeine Other (See Comments)    "jittery"     PERTINENT MEDICATIONS:  Outpatient Encounter Medications as of 02/13/2019  Medication Sig   acetaminophen (TYLENOL) 325 MG tablet Take 2 tablets (650 mg total) by mouth every 6 (six) hours as needed for mild pain (or Fever >/= 101).   albuterol (PROVENTIL) (2.5 MG/3ML) 0.083% nebulizer solution Take 3 mLs (2.5 mg total) by nebulization every 6 (six) hours as needed for wheezing or shortness of breath.   amiodarone (PACERONE) 200 MG tablet Take 1 tablet (200 mg total) by mouth daily.   budesonide-formoterol (SYMBICORT) 160-4.5 MCG/ACT inhaler Inhale 2 puffs into the lungs 2 (two) times daily.   diltiazem (CARDIZEM CD) 180 MG 24 hr capsule Take 1 capsule (180 mg total) by mouth daily.   furosemide (LASIX) 40 MG tablet Take 1 tablet (40 mg total) by mouth daily.   guaiFENesin (MUCINEX) 600 MG 12 hr tablet Take 1 tablet (600 mg total) by mouth 2 (two) times daily. (Patient taking differently: Take 600 mg by mouth 2 (two) times daily as needed. )   metFORMIN (GLUCOPHAGE) 1000 MG tablet Take 1 tablet (1,000 mg total) by mouth 2 (two) times daily with a meal.   metoprolol tartrate (LOPRESSOR) 25 MG tablet Take 1 tablet (25 mg total) by mouth 2 (two) times daily.   Nebulizers (COMPRESSOR/NEBULIZER) MISC Use nebulizer machine with nebulizer solution as  prescribed for COPD   pantoprazole (PROTONIX) 40 MG tablet Take 1 tablet (40 mg total) by mouth daily.   potassium chloride SA (KLOR-CON) 20 MEQ tablet Take 1 tablet (20 mEq total) by mouth daily. While Taking Lasix   QUEtiapine (SEROQUEL) 25 MG tablet Take 1 tablet (25 mg total) by mouth at bedtime.   rivaroxaban (XARELTO) 20 MG TABS tablet Take 1 tablet (20 mg total) by mouth daily with supper.   ondansetron (ZOFRAN) 4 MG tablet Take 1 tablet (4 mg total) by mouth every 6 (six) hours as needed for nausea. (Patient not taking: Reported on 02/13/2019)   [DISCONTINUED] predniSONE (DELTASONE) 20 MG tablet Take 1 tablet (20 mg total) by mouth See admin instructions. Please take 2 tablets (40 mg) every morning with breakfast for 5 days then 1 tablet every morning with breakfast for another 5 days then stop   No facility-administered encounter medications on file as of 02/13/2019.     PHYSICAL EXAM:   General: NAD, frail appearing, thin Cardiovascular: regular rate and rhythm Pulmonary: clear ant fields Abdomen: soft, nontender, + bowel sounds GU: no suprapubic tenderness Extremities: no edema, no joint deformities Skin: no rashes Neurological: Weakness  but otherwise nonfocal  Julianne Handler, NP

## 2019-02-14 DIAGNOSIS — I10 Essential (primary) hypertension: Secondary | ICD-10-CM | POA: Diagnosis not present

## 2019-02-14 DIAGNOSIS — J449 Chronic obstructive pulmonary disease, unspecified: Secondary | ICD-10-CM | POA: Diagnosis not present

## 2019-02-17 DIAGNOSIS — J90 Pleural effusion, not elsewhere classified: Secondary | ICD-10-CM | POA: Diagnosis not present

## 2019-02-17 DIAGNOSIS — R4020 Unspecified coma: Secondary | ICD-10-CM | POA: Diagnosis not present

## 2019-02-17 DIAGNOSIS — S199XXA Unspecified injury of neck, initial encounter: Secondary | ICD-10-CM | POA: Diagnosis not present

## 2019-02-17 DIAGNOSIS — Z87891 Personal history of nicotine dependence: Secondary | ICD-10-CM | POA: Diagnosis not present

## 2019-02-17 DIAGNOSIS — N19 Unspecified kidney failure: Secondary | ICD-10-CM | POA: Diagnosis not present

## 2019-02-17 DIAGNOSIS — J449 Chronic obstructive pulmonary disease, unspecified: Secondary | ICD-10-CM | POA: Diagnosis not present

## 2019-02-17 DIAGNOSIS — I499 Cardiac arrhythmia, unspecified: Secondary | ICD-10-CM | POA: Diagnosis not present

## 2019-02-17 DIAGNOSIS — N179 Acute kidney failure, unspecified: Secondary | ICD-10-CM | POA: Diagnosis not present

## 2019-02-17 DIAGNOSIS — S0990XA Unspecified injury of head, initial encounter: Secondary | ICD-10-CM | POA: Diagnosis not present

## 2019-02-17 DIAGNOSIS — R0602 Shortness of breath: Secondary | ICD-10-CM | POA: Diagnosis not present

## 2019-02-17 DIAGNOSIS — I10 Essential (primary) hypertension: Secondary | ICD-10-CM | POA: Diagnosis not present

## 2019-02-17 DIAGNOSIS — Z743 Need for continuous supervision: Secondary | ICD-10-CM | POA: Diagnosis not present

## 2019-02-17 DIAGNOSIS — D649 Anemia, unspecified: Secondary | ICD-10-CM | POA: Diagnosis not present

## 2019-02-17 DIAGNOSIS — Z853 Personal history of malignant neoplasm of breast: Secondary | ICD-10-CM | POA: Diagnosis not present

## 2019-02-17 DIAGNOSIS — Z885 Allergy status to narcotic agent status: Secondary | ICD-10-CM | POA: Diagnosis not present

## 2019-02-17 DIAGNOSIS — I469 Cardiac arrest, cause unspecified: Secondary | ICD-10-CM | POA: Diagnosis not present

## 2019-02-17 DIAGNOSIS — R404 Transient alteration of awareness: Secondary | ICD-10-CM | POA: Diagnosis not present

## 2019-02-18 DIAGNOSIS — G931 Anoxic brain damage, not elsewhere classified: Secondary | ICD-10-CM | POA: Diagnosis not present

## 2019-02-18 DIAGNOSIS — C50919 Malignant neoplasm of unspecified site of unspecified female breast: Secondary | ICD-10-CM | POA: Diagnosis not present

## 2019-02-18 DIAGNOSIS — R918 Other nonspecific abnormal finding of lung field: Secondary | ICD-10-CM | POA: Diagnosis not present

## 2019-02-18 DIAGNOSIS — N281 Cyst of kidney, acquired: Secondary | ICD-10-CM | POA: Diagnosis not present

## 2019-02-18 DIAGNOSIS — Z87891 Personal history of nicotine dependence: Secondary | ICD-10-CM | POA: Diagnosis not present

## 2019-02-18 DIAGNOSIS — Z4682 Encounter for fitting and adjustment of non-vascular catheter: Secondary | ICD-10-CM | POA: Diagnosis not present

## 2019-02-18 DIAGNOSIS — J441 Chronic obstructive pulmonary disease with (acute) exacerbation: Secondary | ICD-10-CM | POA: Diagnosis not present

## 2019-02-18 DIAGNOSIS — J9621 Acute and chronic respiratory failure with hypoxia: Secondary | ICD-10-CM | POA: Diagnosis not present

## 2019-02-18 DIAGNOSIS — G9340 Encephalopathy, unspecified: Secondary | ICD-10-CM | POA: Diagnosis not present

## 2019-02-18 DIAGNOSIS — I10 Essential (primary) hypertension: Secondary | ICD-10-CM | POA: Diagnosis not present

## 2019-02-18 DIAGNOSIS — J9602 Acute respiratory failure with hypercapnia: Secondary | ICD-10-CM | POA: Diagnosis not present

## 2019-02-18 DIAGNOSIS — I469 Cardiac arrest, cause unspecified: Secondary | ICD-10-CM | POA: Diagnosis not present

## 2019-02-18 DIAGNOSIS — Z9911 Dependence on respirator [ventilator] status: Secondary | ICD-10-CM | POA: Diagnosis not present

## 2019-02-18 DIAGNOSIS — Z853 Personal history of malignant neoplasm of breast: Secondary | ICD-10-CM | POA: Diagnosis not present

## 2019-02-18 DIAGNOSIS — R7401 Elevation of levels of liver transaminase levels: Secondary | ICD-10-CM | POA: Diagnosis not present

## 2019-02-18 DIAGNOSIS — R945 Abnormal results of liver function studies: Secondary | ICD-10-CM | POA: Diagnosis not present

## 2019-02-18 DIAGNOSIS — N179 Acute kidney failure, unspecified: Secondary | ICD-10-CM | POA: Diagnosis not present

## 2019-02-18 DIAGNOSIS — J9601 Acute respiratory failure with hypoxia: Secondary | ICD-10-CM | POA: Diagnosis not present

## 2019-02-18 DIAGNOSIS — Z66 Do not resuscitate: Secondary | ICD-10-CM | POA: Diagnosis not present

## 2019-02-18 DIAGNOSIS — J90 Pleural effusion, not elsewhere classified: Secondary | ICD-10-CM | POA: Diagnosis not present

## 2019-02-18 DIAGNOSIS — Z885 Allergy status to narcotic agent status: Secondary | ICD-10-CM | POA: Diagnosis not present

## 2019-02-18 DIAGNOSIS — J449 Chronic obstructive pulmonary disease, unspecified: Secondary | ICD-10-CM | POA: Diagnosis not present

## 2019-02-18 DIAGNOSIS — I959 Hypotension, unspecified: Secondary | ICD-10-CM | POA: Diagnosis not present

## 2019-02-18 DIAGNOSIS — R509 Fever, unspecified: Secondary | ICD-10-CM | POA: Diagnosis not present

## 2019-02-18 DIAGNOSIS — N19 Unspecified kidney failure: Secondary | ICD-10-CM | POA: Diagnosis not present

## 2019-02-18 DIAGNOSIS — I4892 Unspecified atrial flutter: Secondary | ICD-10-CM | POA: Diagnosis not present

## 2019-02-18 DIAGNOSIS — D649 Anemia, unspecified: Secondary | ICD-10-CM | POA: Diagnosis not present

## 2019-02-18 DIAGNOSIS — I468 Cardiac arrest due to other underlying condition: Secondary | ICD-10-CM | POA: Diagnosis not present

## 2019-02-18 DIAGNOSIS — Z452 Encounter for adjustment and management of vascular access device: Secondary | ICD-10-CM | POA: Diagnosis not present

## 2019-02-18 DIAGNOSIS — I4891 Unspecified atrial fibrillation: Secondary | ICD-10-CM | POA: Diagnosis not present

## 2019-02-18 DIAGNOSIS — I272 Pulmonary hypertension, unspecified: Secondary | ICD-10-CM | POA: Diagnosis not present

## 2019-02-18 DIAGNOSIS — R9401 Abnormal electroencephalogram [EEG]: Secondary | ICD-10-CM | POA: Diagnosis not present

## 2019-02-18 DIAGNOSIS — I700 Atherosclerosis of aorta: Secondary | ICD-10-CM | POA: Diagnosis not present

## 2019-02-18 DIAGNOSIS — I482 Chronic atrial fibrillation, unspecified: Secondary | ICD-10-CM | POA: Diagnosis not present

## 2019-02-18 DIAGNOSIS — J9622 Acute and chronic respiratory failure with hypercapnia: Secondary | ICD-10-CM | POA: Diagnosis not present

## 2019-02-18 DIAGNOSIS — D72829 Elevated white blood cell count, unspecified: Secondary | ICD-10-CM | POA: Diagnosis not present

## 2019-02-18 DIAGNOSIS — N390 Urinary tract infection, site not specified: Secondary | ICD-10-CM | POA: Diagnosis not present

## 2019-02-18 DIAGNOSIS — R569 Unspecified convulsions: Secondary | ICD-10-CM | POA: Diagnosis not present

## 2019-02-20 DIAGNOSIS — R9401 Abnormal electroencephalogram [EEG]: Secondary | ICD-10-CM | POA: Diagnosis not present

## 2019-02-20 DIAGNOSIS — R569 Unspecified convulsions: Secondary | ICD-10-CM | POA: Diagnosis not present

## 2019-02-25 MED ORDER — MORPHINE SULFATE 4 MG/ML IJ SOLN
4.00 | INTRAMUSCULAR | Status: DC
Start: ? — End: 2019-02-25

## 2019-02-25 MED ORDER — LORAZEPAM 2 MG/ML IJ SOLN
0.50 | INTRAMUSCULAR | Status: DC
Start: ? — End: 2019-02-25

## 2019-02-25 MED ORDER — SCOPOLAMINE 1 MG/3DAYS TD PT72
1.00 | MEDICATED_PATCH | TRANSDERMAL | Status: DC
Start: 2019-02-26 — End: 2019-02-25

## 2019-03-07 DIAGNOSIS — I10 Essential (primary) hypertension: Secondary | ICD-10-CM | POA: Diagnosis not present

## 2019-03-07 DIAGNOSIS — J9691 Respiratory failure, unspecified with hypoxia: Secondary | ICD-10-CM | POA: Diagnosis not present

## 2019-03-07 DIAGNOSIS — I4892 Unspecified atrial flutter: Secondary | ICD-10-CM | POA: Diagnosis not present

## 2019-03-07 DIAGNOSIS — E119 Type 2 diabetes mellitus without complications: Secondary | ICD-10-CM | POA: Diagnosis not present

## 2019-03-07 DIAGNOSIS — J449 Chronic obstructive pulmonary disease, unspecified: Secondary | ICD-10-CM | POA: Diagnosis not present

## 2019-03-07 DIAGNOSIS — R0689 Other abnormalities of breathing: Secondary | ICD-10-CM | POA: Diagnosis not present

## 2019-03-14 DEATH — deceased

## 2019-08-26 IMAGING — CR PORTABLE CHEST - 1 VIEW
1 series · 2 of 2 positions shown · non-contrast
Comparison: Radiographs August 20, 2018.

CLINICAL DATA: Shortness of breath.

EXAM:
PORTABLE CHEST 1 VIEW

[Series 1: portable · 0.17mm/px · 2 of 2 slices shown]
[im 1/2]
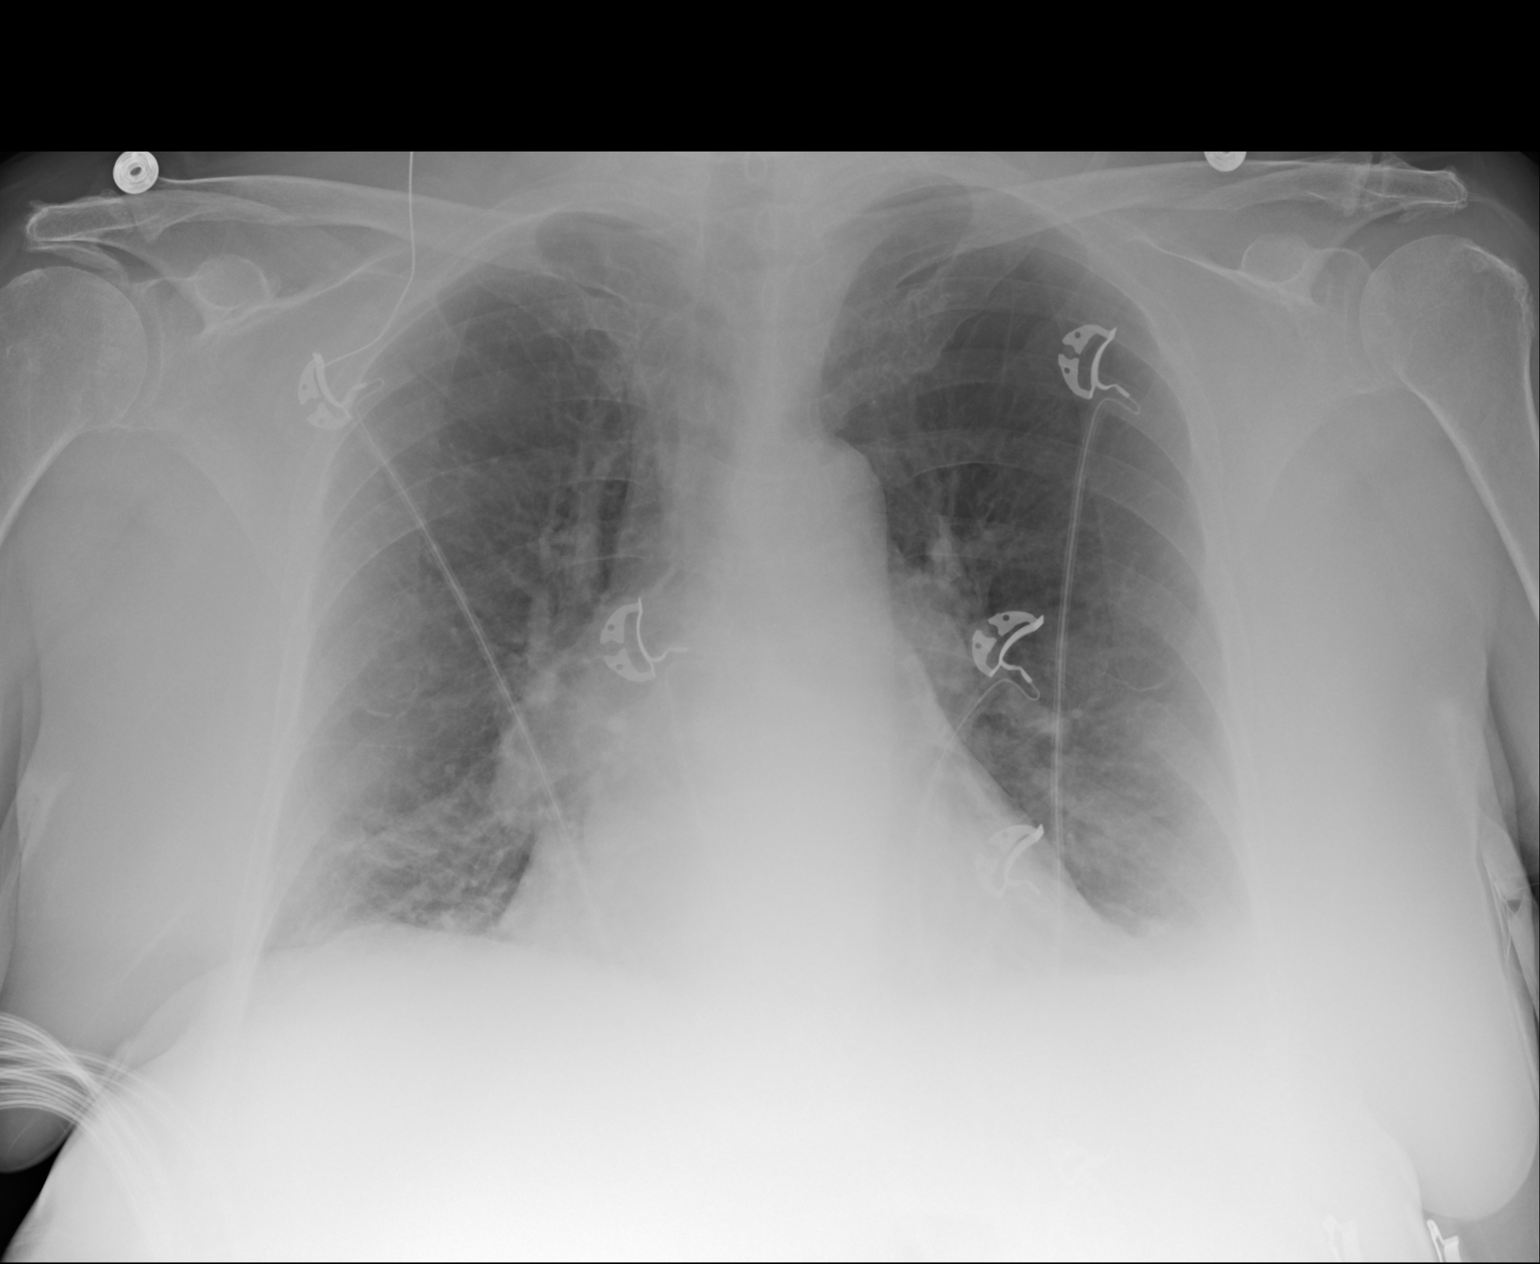
[im 2/2]
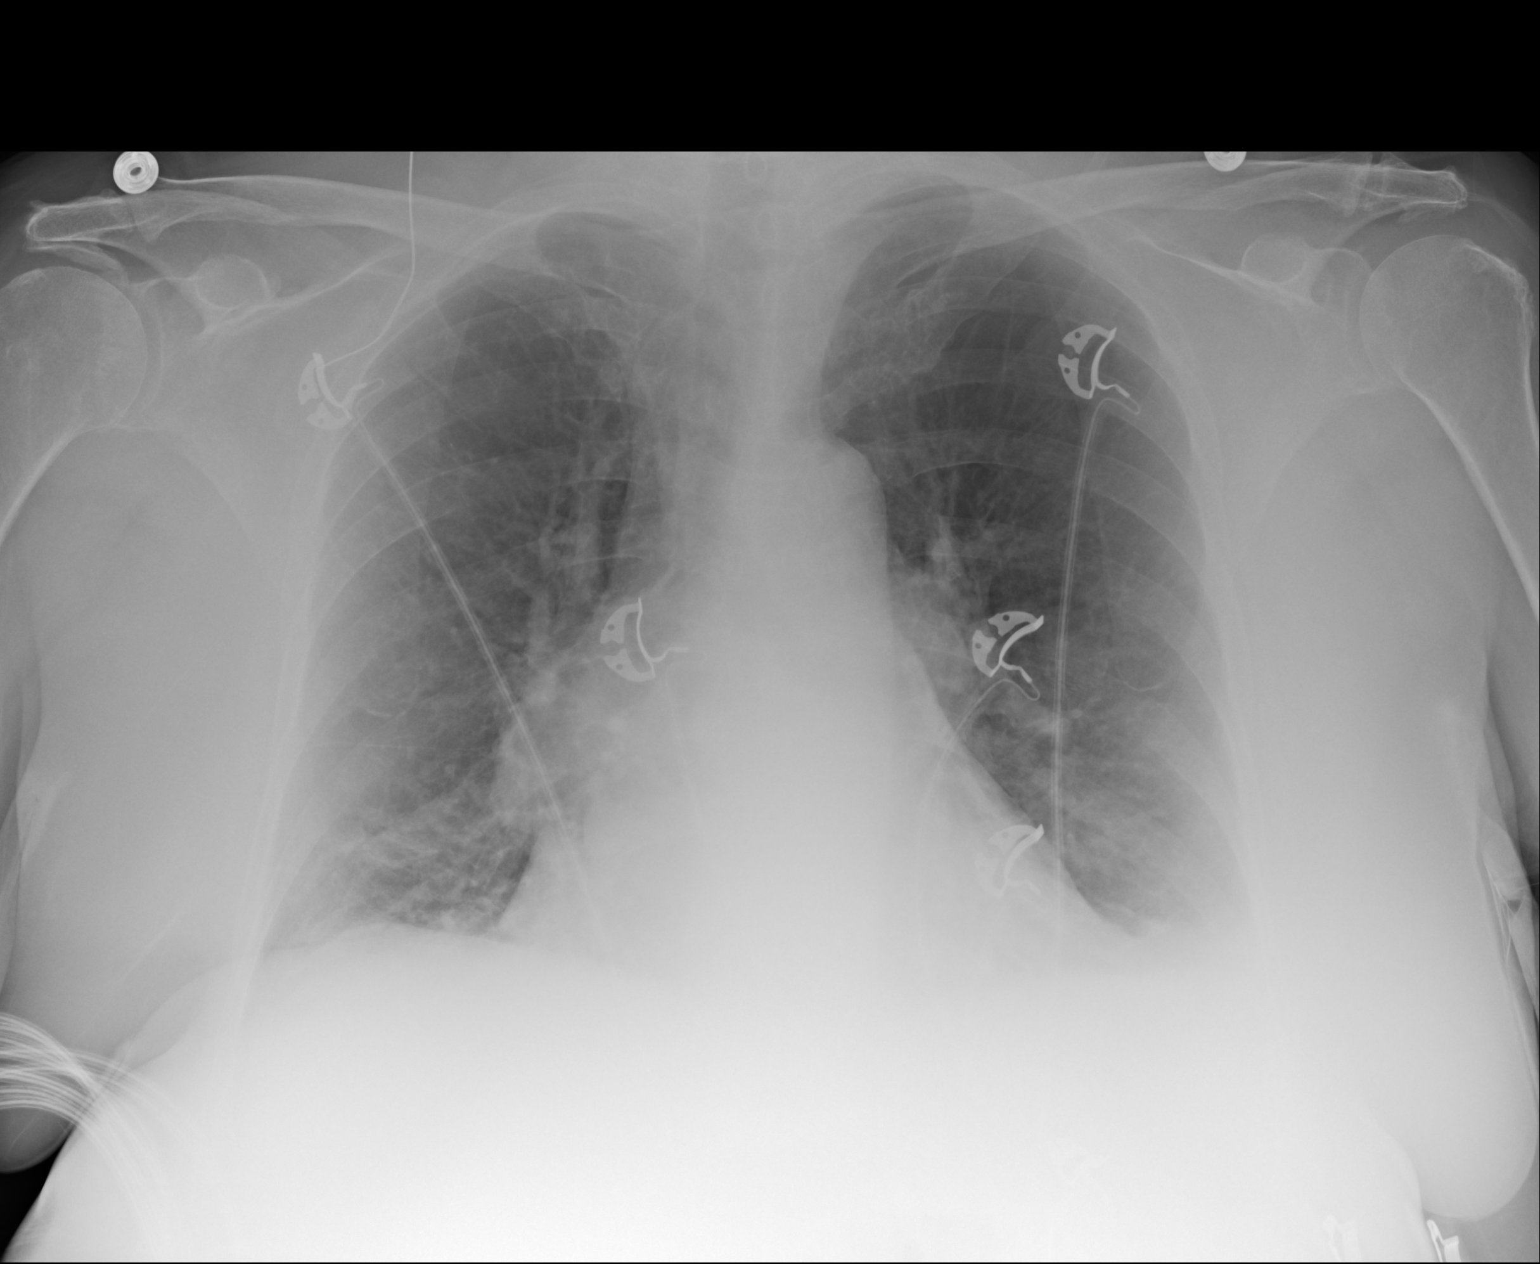

[2 of 2 positions shown; findings below may reference images not displayed]

FINDINGS: Stable cardiomediastinal silhouette. No pneumothorax is noted. Mild
bibasilar subsegmental atelectasis is noted. Bony thorax is
unremarkable.
IMPRESSION: Mild bibasilar subsegmental atelectasis.

## 2019-11-04 IMAGING — DX DG CHEST 2V
2 series · 2 of 2 positions shown · non-contrast
Comparison: 11/19/2018

CLINICAL DATA: Bright red blood in the stool and rectal pain
beginning today. History of hemorrhoids.

EXAM:
CHEST - 2 VIEW

[chest lat]
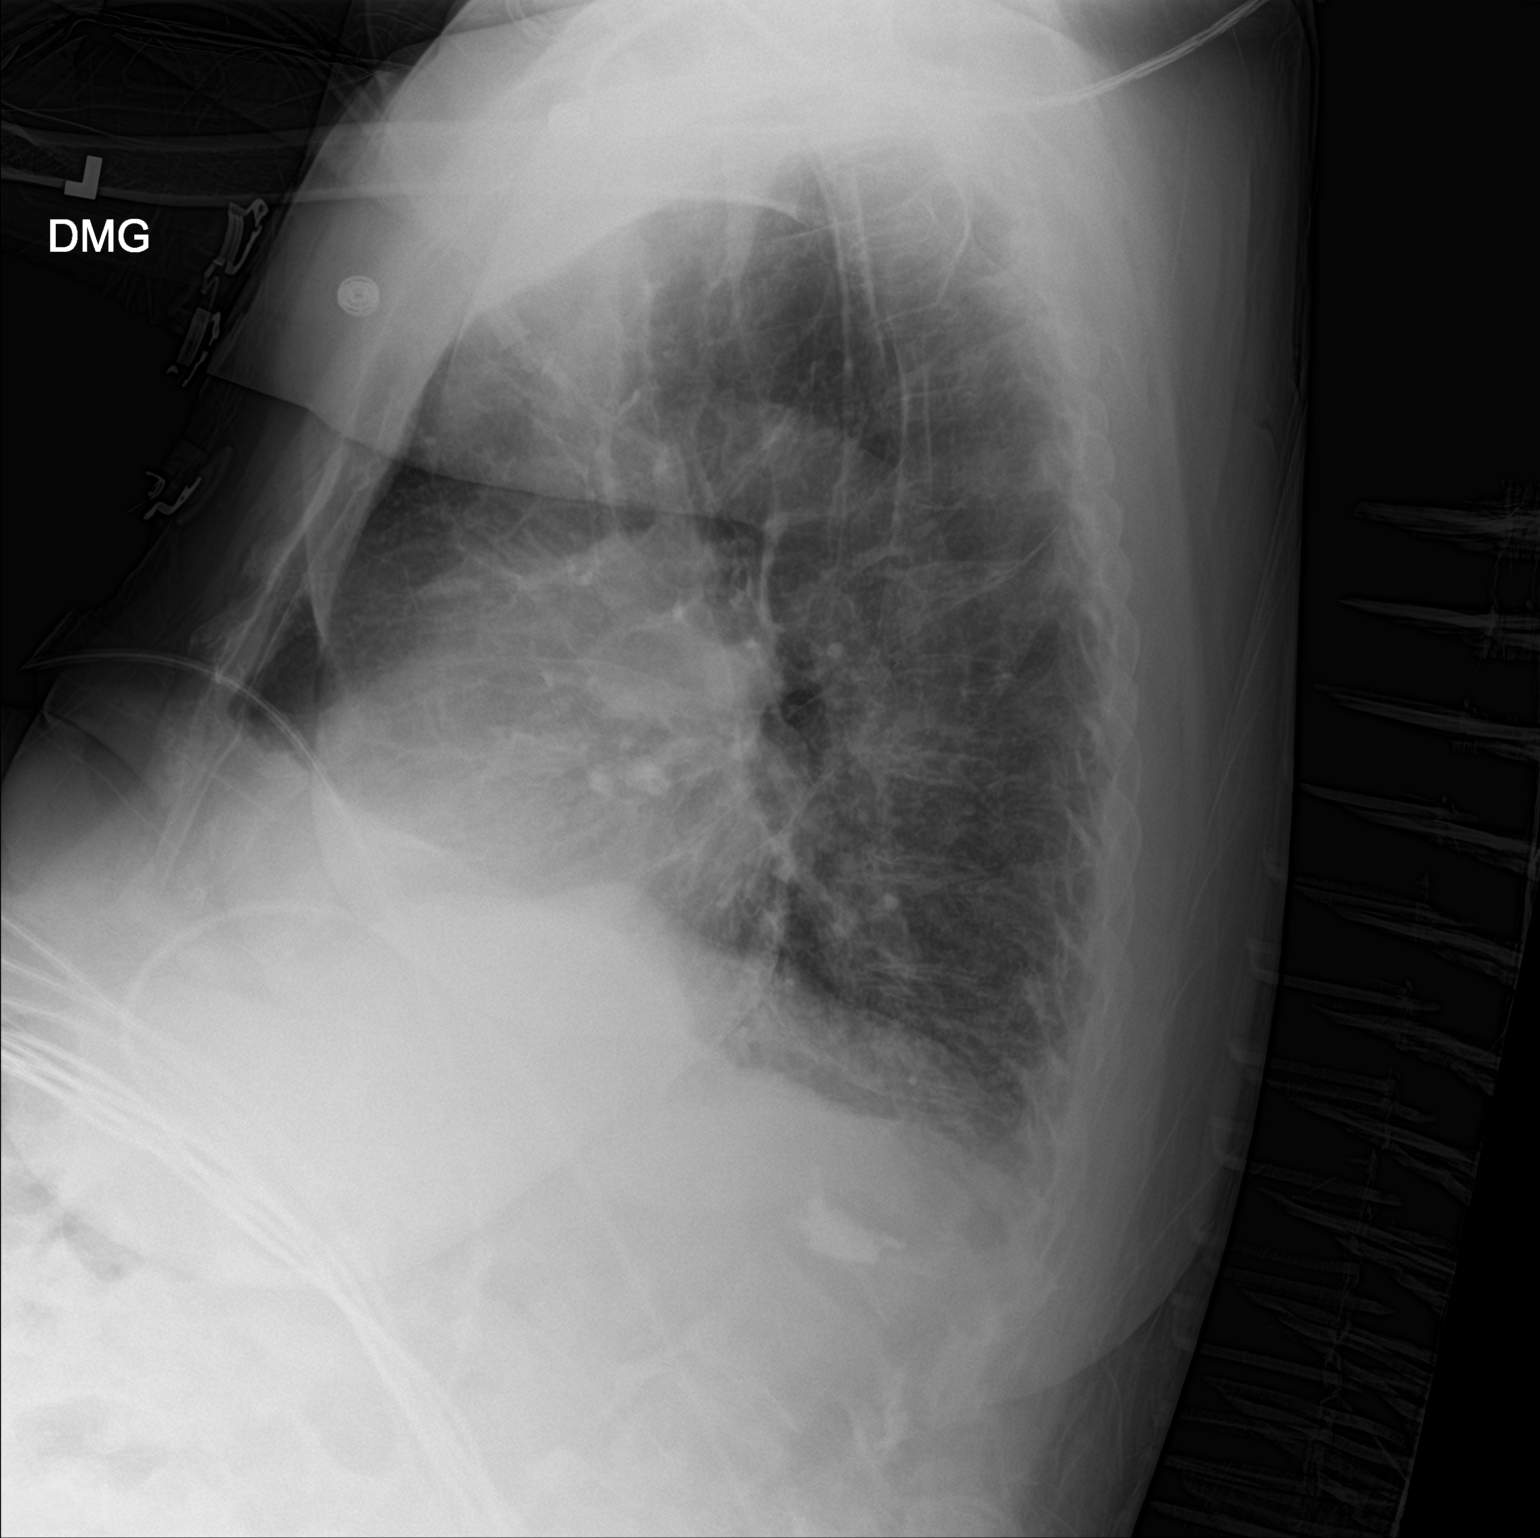

[chest ap]
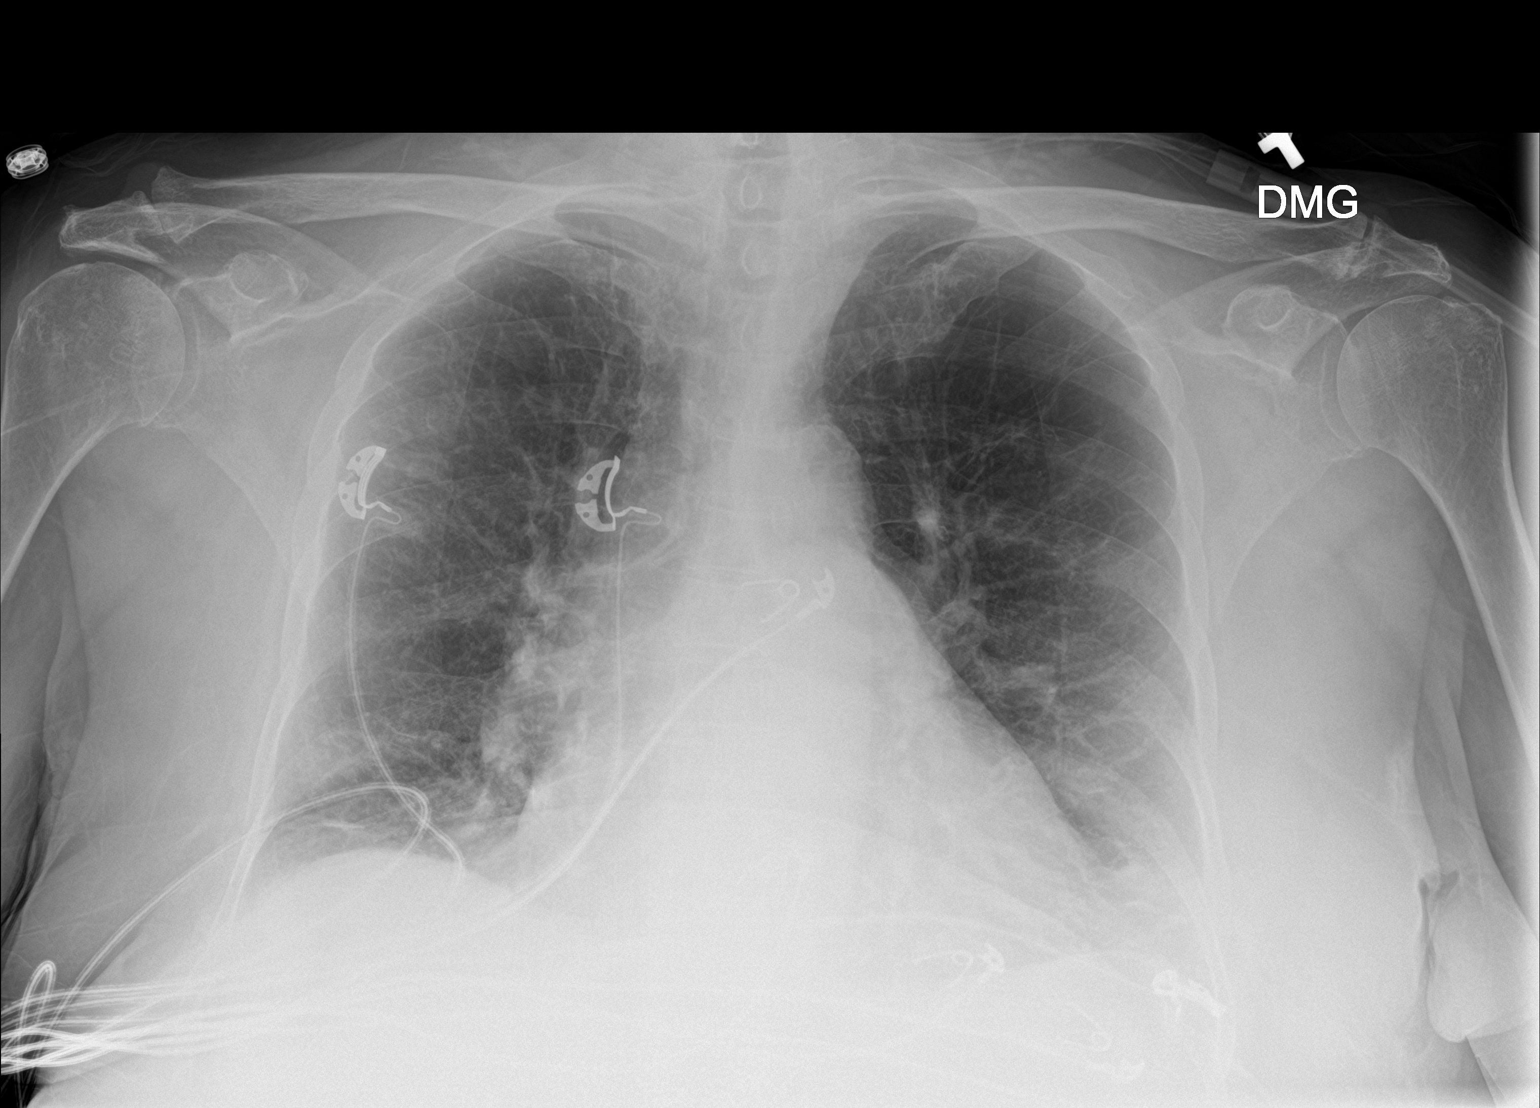

[2 of 2 positions shown; findings below may reference images not displayed]

FINDINGS: Cardiac silhouette is mildly enlarged. No mediastinal or hilar
masses. No evidence of adenopathy.

Chronic prominence of the bronchovascular markings. Additional
linear lung base opacities consistent with scarring or atelectasis.
No evidence of pneumonia or pulmonary edema. No pleural effusion or
pneumothorax.

Previously treated compression fracture at the thoracolumbar
junction. No acute skeletal abnormality.
IMPRESSION: No acute cardiopulmonary disease.

## 2019-11-04 IMAGING — CT CT ABD-PELV W/ CM
3 of 5 series · 16 of 46 positions shown, 18 images · IV contrast (omnipaque)
Comparison: 04/21/2018

CLINICAL DATA: 72-year-old female with a history of GI bleed

EXAM:
CT ABDOMEN AND PELVIS WITH CONTRAST
TECHNIQUE: Multidetector CT imaging of the abdomen and pelvis was performed
using the standard protocol following bolus administration of
intravenous contrast.
CONTRAST:  100mL OMNIPAQUE IOHEXOL 300 MG/ML  SOLN

[Series 2: axial st · axial · 0.80mm/px · z∈[-320,-0]mm · 11 of 78 slices shown, 13 images]
[im 7/78  soft-tissue]
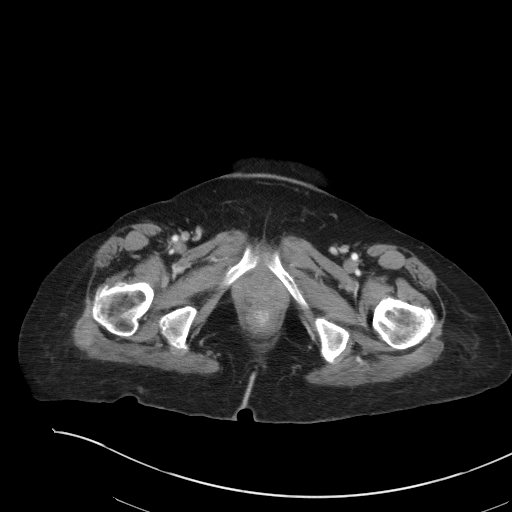
[im 7/78  bone]
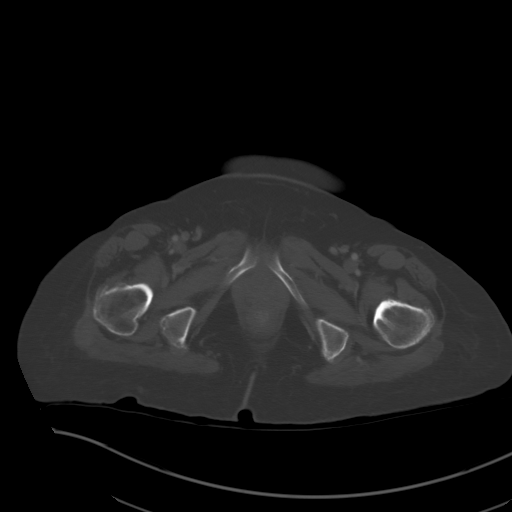
[im 13/78  soft-tissue]
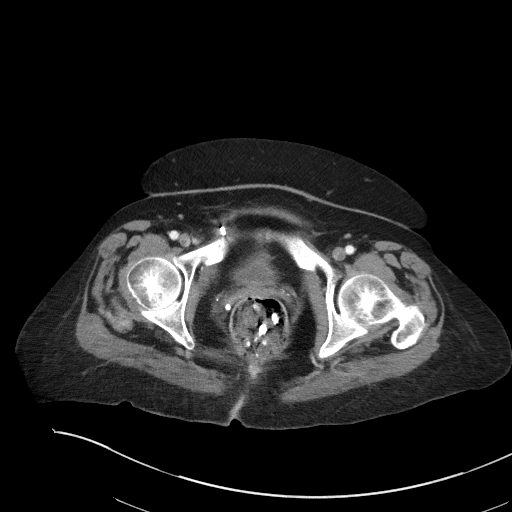
[im 20/78  soft-tissue]
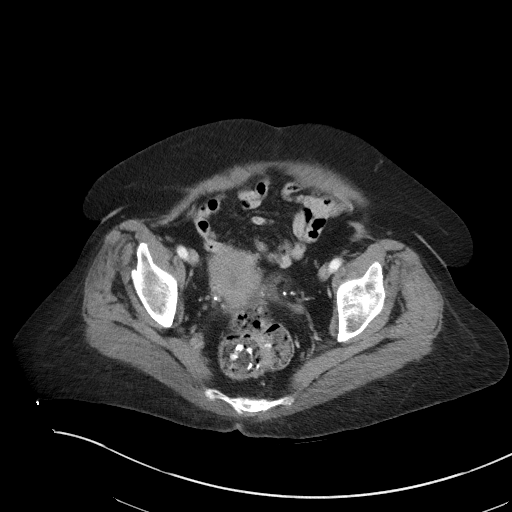
[im 26/78  soft-tissue]
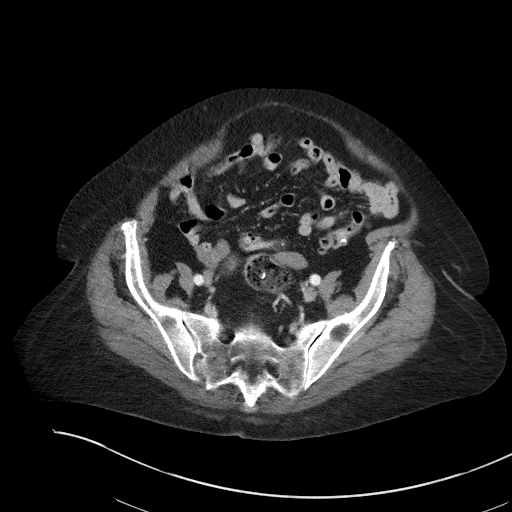
[im 33/78  soft-tissue]
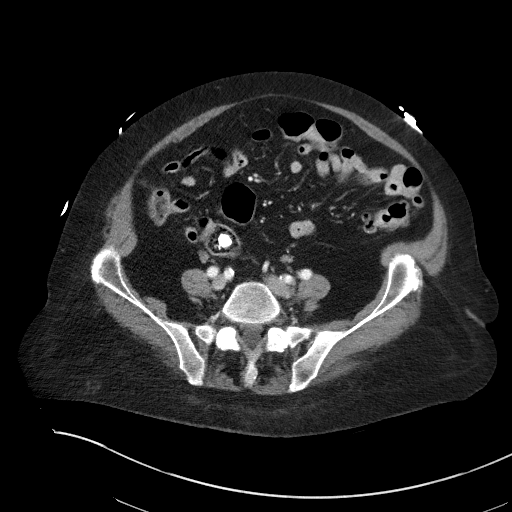
[im 39/78  soft-tissue]
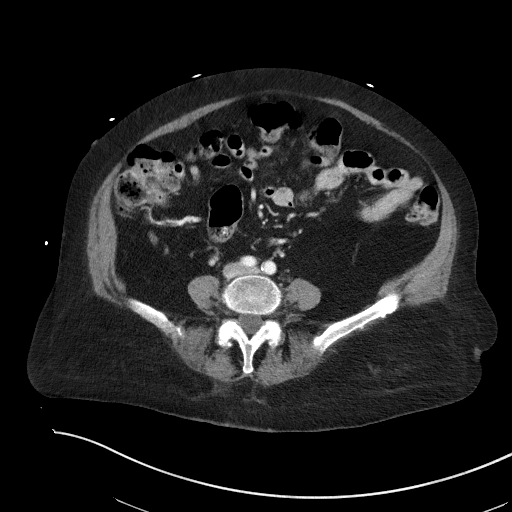
[im 45/78  soft-tissue]
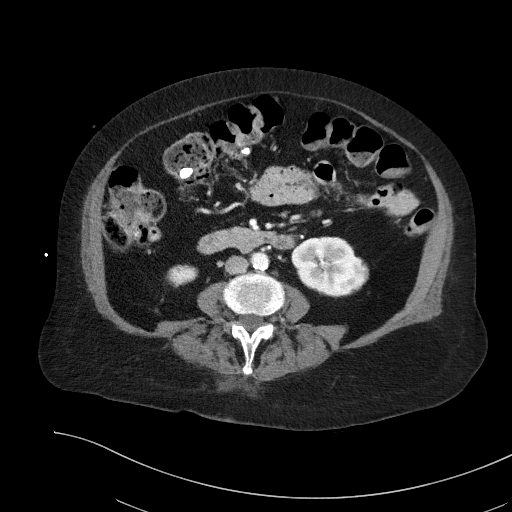
[im 52/78  soft-tissue]
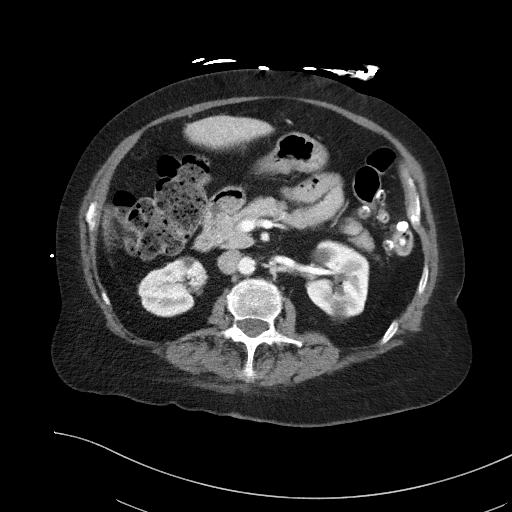
[im 58/78  soft-tissue]
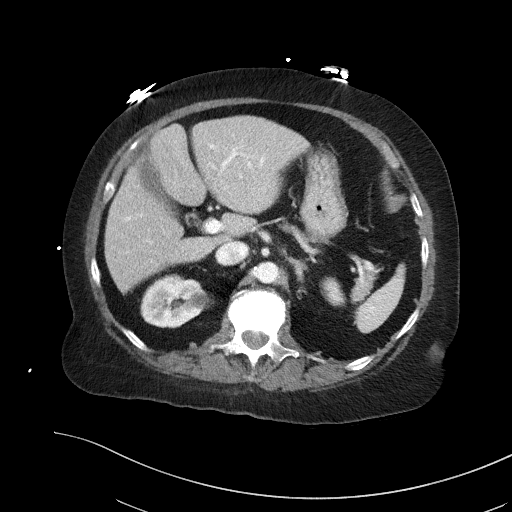
[im 58/78  bone]
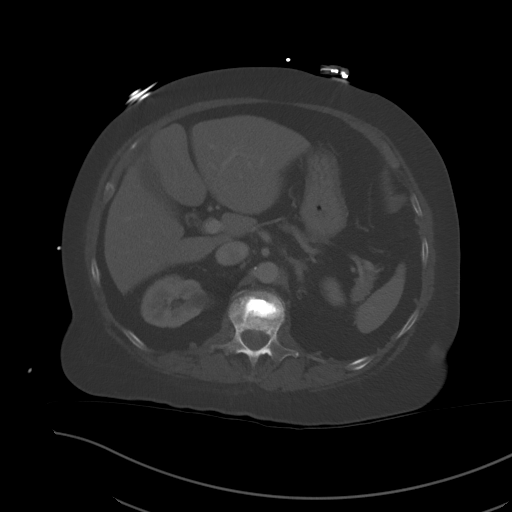
[im 65/78  soft-tissue]
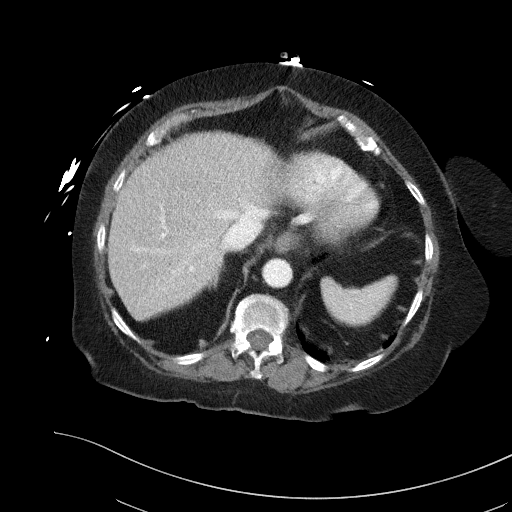
[im 71/78  soft-tissue]
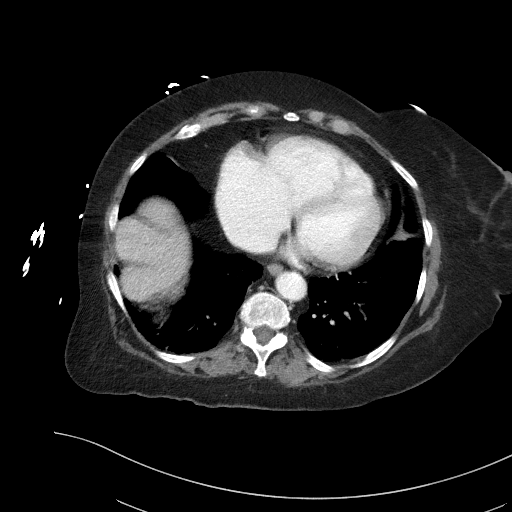

[Series 4: lung bases · axial · 0.80mm/px · z∈[-133,-105]mm · 2 of 91 slices shown]
[im 7/91  bone]
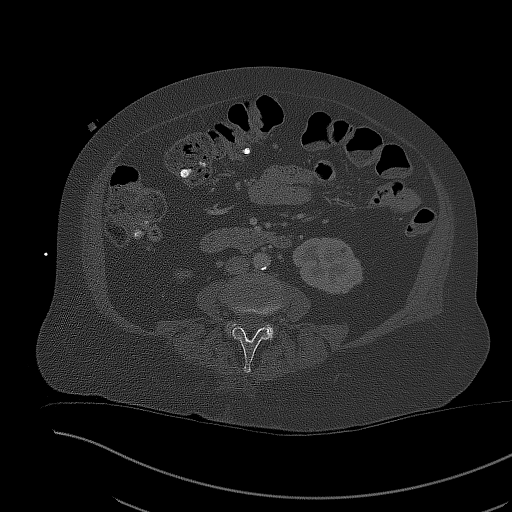
[im 21/91  bone]
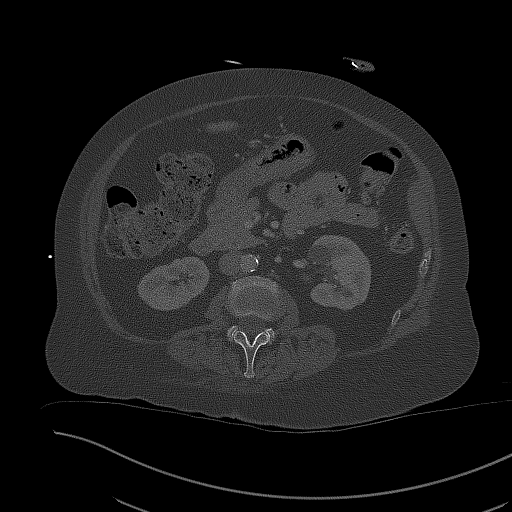

[Series 5: coronal st · coronal · 0.74mm/px · 3 of 103 slices shown]
[im 35/103  soft-tissue]
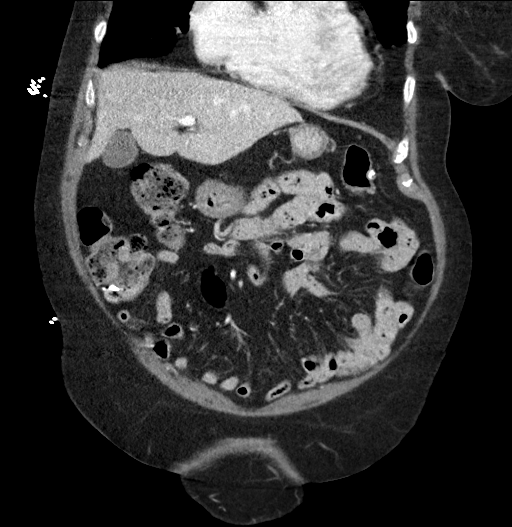
[im 46/103  soft-tissue]
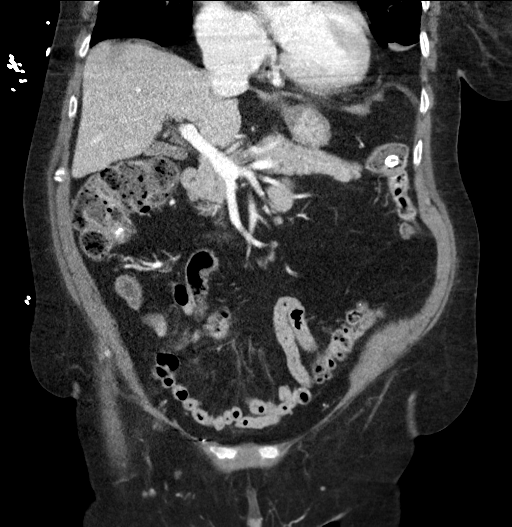
[im 57/103  soft-tissue]
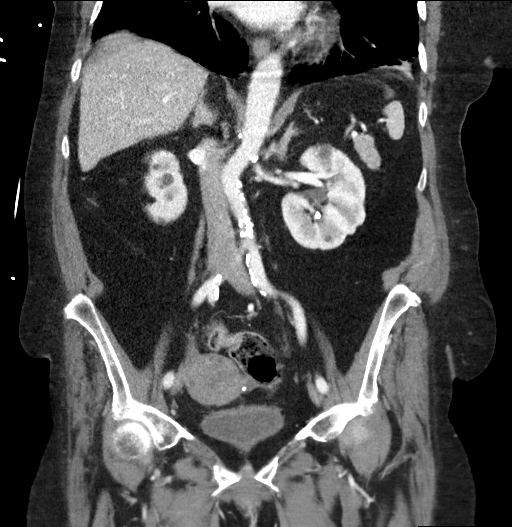

[16 of 46 positions shown; findings below may reference images not displayed]

FINDINGS: Lower chest: Scarring/atelectasis at the lung bases.

Hepatobiliary: Unremarkable liver.  Unremarkable gallbladder.

Pancreas: Unremarkable

Spleen: Unremarkable

Adrenals/Urinary Tract: Unremarkable appearance of the bilateral
adrenal glands.

Right kidney with no hydronephrosis or nephrolithiasis. Bosniak 1
cyst on the medial cortex of the right kidney on image 22.
Additional subcentimeter low-density lesion on the anterior cortex
of the right kidney on image 27 too small to characterize.

Left kidney without hydronephrosis. Nonobstructive nephrolithiasis
at the inferior collecting system, with stone measuring
approximately 4 mm.

Bosniak 1 cyst on the posterior cortex of the left kidney on image
26. Bosniak 1 cyst on the medial anterior cortex of the left kidney
on image 28. There are additional low-density lesions which are too
small to characterize.

Urinary bladder is relatively decompressed.

Stomach/Bowel: Small hiatal hernia. Otherwise unremarkable stomach.
Small bowel unremarkable. No abnormal distention. No focal wall
thickening or inflammatory changes. No transition point. Normal
appendix.

Mild stool burden. Colonic diverticular disease throughout the
colon. Retained contrast or inspissated secretions within numerous
diverticula. No focal inflammatory changes are evident. No fluid
within the bowel or contrast accumulation.

Vascular/Lymphatic: Atherosclerotic changes of the abdominal aorta.
No aneurysm or dissection. Mesenteric arteries and renal arteries
are patent. Bilateral iliac arteries and proximal femoral arteries
are patent.

No adenopathy.

Reproductive: Focal low-density fluid or tissue within the
endometrial canal on image 58. Unremarkable left and right ovary.

Other: Fat containing umbilical hernia with ventral diastasis. Small
loop of small bowel at this site without entrapment.

Musculoskeletal: Fracture of L1, treated with vertebral
augmentation. Degree of posterior retropulsion similar to the
comparison. Are no acute displaced fracture.
IMPRESSION: No acute CT finding of the abdomen, with no CT evidence to identify
source of GI hemorrhage.

Colonic diverticula without evidence of acute inflammation.

Hiatal hernia.

Trace fluid or tissue within the endometrial canal, unexpected in a
patient of this age. Referral for gynecologic follow-up is
recommended, and potentially evaluation with ultrasound or MRI as
early malignancy cannot be excluded.

Aortic Atherosclerosis ([id number]).

Left-sided nonobstructive nephrolithiasis.

## 2019-11-07 IMAGING — CR DG CHEST 1V PORT
1 series · 1 of 1 positions shown · non-contrast
Comparison: Radiograph 12/05/2018, CT 09/27/2018

CLINICAL DATA: Dyspnea

EXAM:
PORTABLE CHEST 1 VIEW

[portable]
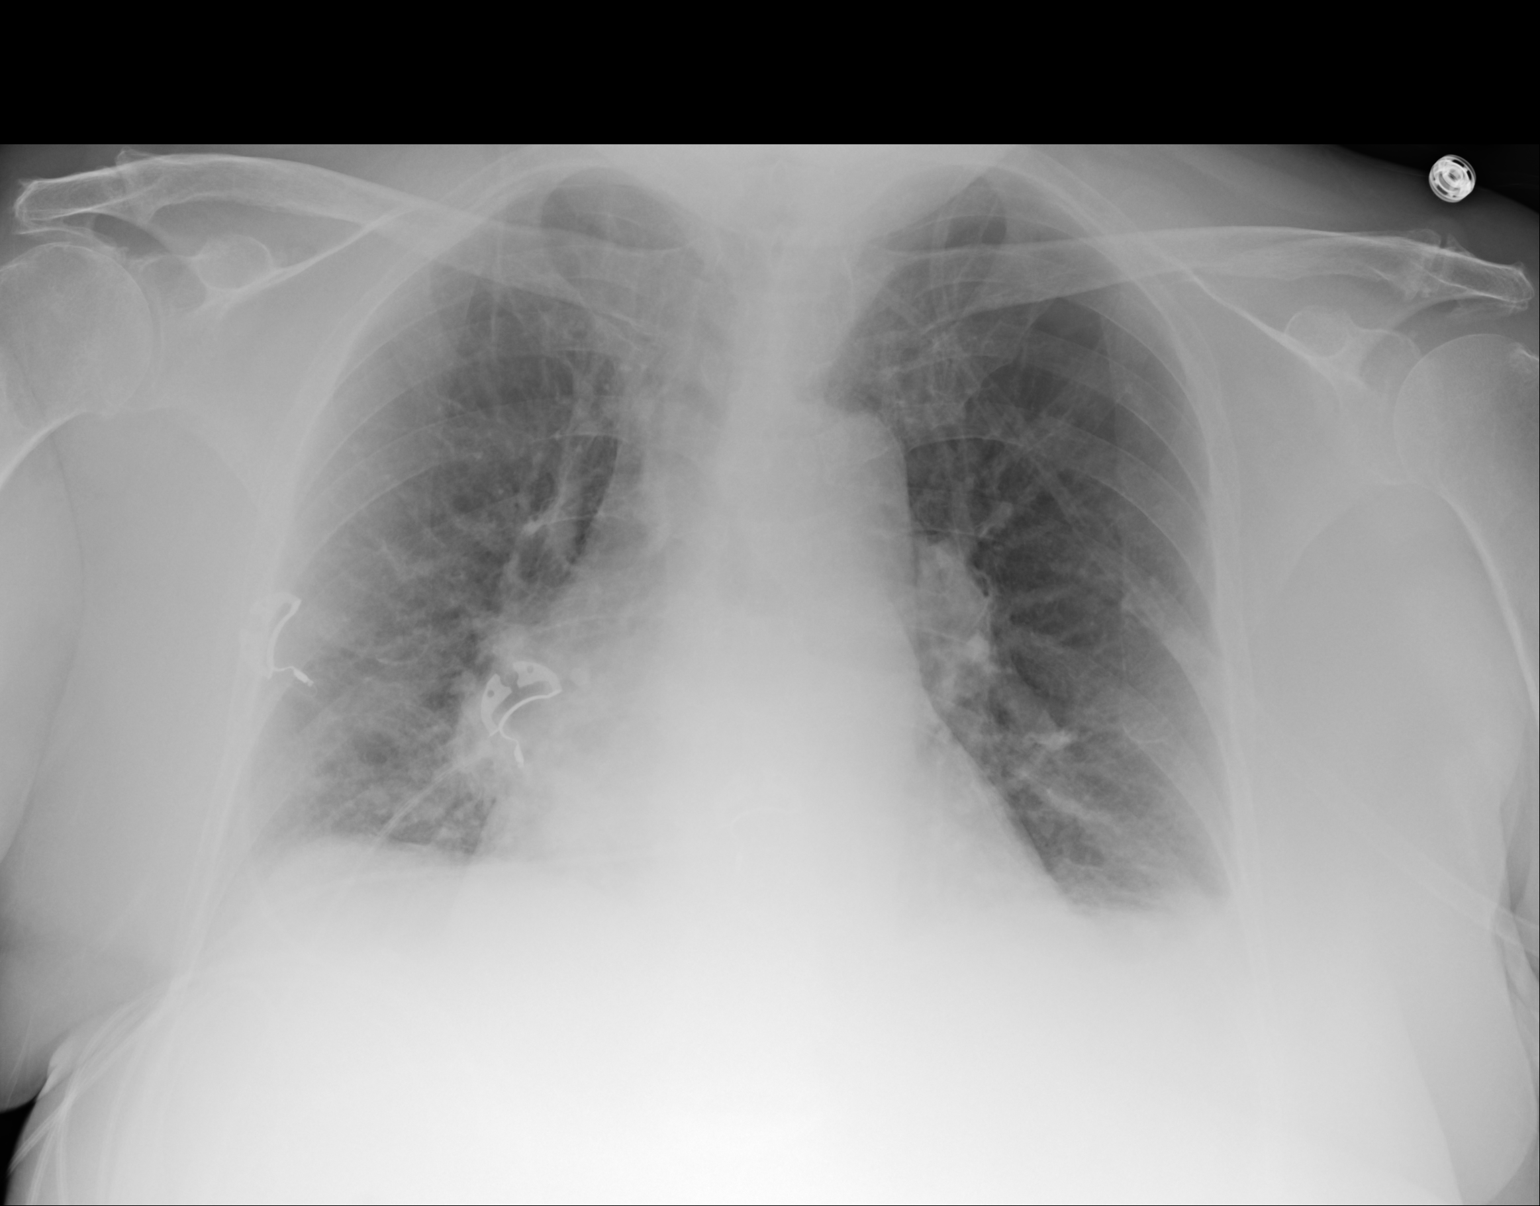

[1 of 1 positions shown; findings below may reference images not displayed]

FINDINGS: Lung volumes are diminished with areas of basilar atelectasis. More
hazy interstitial opacities present throughout the lower lobes with
cephalized, indistinct vascularity and borderline cardiomegaly. More
coalescent opacities seen in the right lung base. Region of left
basilar scarring blunts the costophrenic sulcus. No visible
effusions. No pneumothorax. No acute osseous or soft tissue
abnormality. Degenerative changes are present in the imaged spine
and shoulders.
IMPRESSION: 1. Findings consistent with congestive heart failure.
2. More coalescent opacities in the right lung base could reflect
superimposed alveolar edema or pneumonia.
3. Lung volumes are diminished with bibasilar atelectasis.

## 2019-11-28 IMAGING — CR DG CHEST 1V PORT
1 series · 1 of 1 positions shown · non-contrast
Comparison: 12/27/2018

CLINICAL DATA: Pulmonary edema.

EXAM:
PORTABLE CHEST 1 VIEW

[portable]
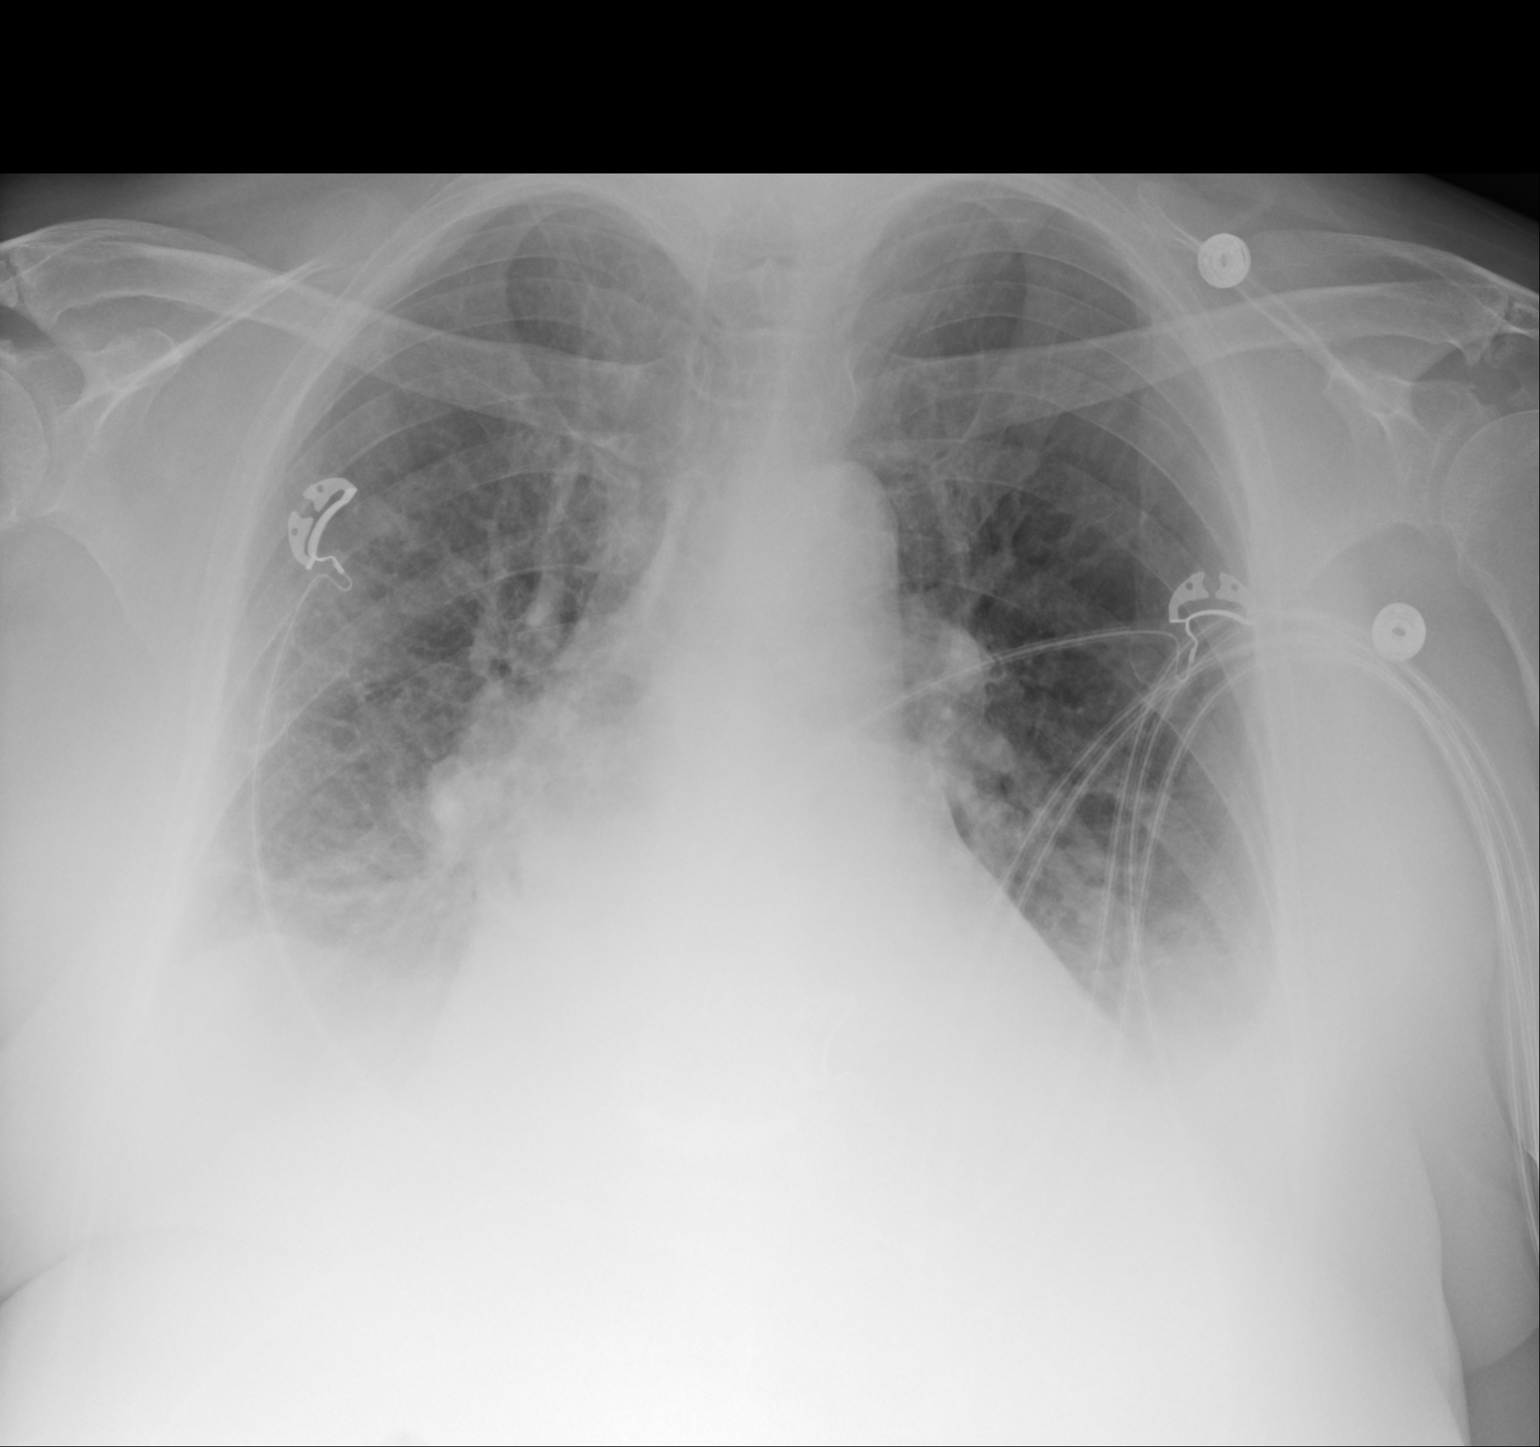

[1 of 1 positions shown; findings below may reference images not displayed]

FINDINGS: Persistent basilar chest densities could represent a combination of
pleural effusions and atelectasis, left side greater than right.
Heart size remains enlarged. Asymmetric prominent interstitial lung
markings, right side greater than left, are similar to the previous
examination. Negative for a pneumothorax. Trachea is midline.
IMPRESSION: 1. Stable chest radiograph findings. Minimal change since
12/27/2018.
[DATE]. Persistent basilar chest densities could represent a combination
of pleural effusions and atelectasis.
3. Asymmetric interstitial lung densities may represent mild edema.
